# Patient Record
Sex: Female | Born: 1955 | Race: White | Hispanic: No | Smoking: Never smoker
Health system: Southern US, Community
[De-identification: ages and names within clinical notes are randomized; demographics above are authoritative.]

## PROBLEM LIST (undated history)

## (undated) DIAGNOSIS — F329 Major depressive disorder, single episode, unspecified: Secondary | ICD-10-CM

## (undated) DIAGNOSIS — D649 Anemia, unspecified: Secondary | ICD-10-CM

## (undated) DIAGNOSIS — K909 Intestinal malabsorption, unspecified: Secondary | ICD-10-CM

## (undated) DIAGNOSIS — M797 Fibromyalgia: Secondary | ICD-10-CM

## (undated) DIAGNOSIS — N289 Disorder of kidney and ureter, unspecified: Secondary | ICD-10-CM

## (undated) DIAGNOSIS — I219 Acute myocardial infarction, unspecified: Secondary | ICD-10-CM

## (undated) DIAGNOSIS — S92919A Unspecified fracture of unspecified toe(s), initial encounter for closed fracture: Secondary | ICD-10-CM

## (undated) DIAGNOSIS — C229 Malignant neoplasm of liver, not specified as primary or secondary: Secondary | ICD-10-CM

## (undated) DIAGNOSIS — M4800 Spinal stenosis, site unspecified: Secondary | ICD-10-CM

## (undated) DIAGNOSIS — M5430 Sciatica, unspecified side: Secondary | ICD-10-CM

## (undated) DIAGNOSIS — C50911 Malignant neoplasm of unspecified site of right female breast: Secondary | ICD-10-CM

## (undated) DIAGNOSIS — I10 Essential (primary) hypertension: Secondary | ICD-10-CM

## (undated) DIAGNOSIS — M419 Scoliosis, unspecified: Secondary | ICD-10-CM

## (undated) DIAGNOSIS — F32A Depression, unspecified: Secondary | ICD-10-CM

## (undated) DIAGNOSIS — M545 Low back pain, unspecified: Secondary | ICD-10-CM

## (undated) DIAGNOSIS — M159 Polyosteoarthritis, unspecified: Secondary | ICD-10-CM

## (undated) DIAGNOSIS — K56609 Unspecified intestinal obstruction, unspecified as to partial versus complete obstruction: Secondary | ICD-10-CM

## (undated) DIAGNOSIS — C73 Malignant neoplasm of thyroid gland: Secondary | ICD-10-CM

## (undated) DIAGNOSIS — G8929 Other chronic pain: Secondary | ICD-10-CM

## (undated) DIAGNOSIS — E039 Hypothyroidism, unspecified: Secondary | ICD-10-CM

## (undated) DIAGNOSIS — D510 Vitamin B12 deficiency anemia due to intrinsic factor deficiency: Secondary | ICD-10-CM

## (undated) DIAGNOSIS — G4730 Sleep apnea, unspecified: Secondary | ICD-10-CM

## (undated) DIAGNOSIS — I509 Heart failure, unspecified: Secondary | ICD-10-CM

## (undated) DIAGNOSIS — D500 Iron deficiency anemia secondary to blood loss (chronic): Principal | ICD-10-CM

## (undated) DIAGNOSIS — R112 Nausea with vomiting, unspecified: Secondary | ICD-10-CM

## (undated) DIAGNOSIS — M009 Pyogenic arthritis, unspecified: Secondary | ICD-10-CM

## (undated) DIAGNOSIS — K432 Incisional hernia without obstruction or gangrene: Secondary | ICD-10-CM

## (undated) DIAGNOSIS — K219 Gastro-esophageal reflux disease without esophagitis: Secondary | ICD-10-CM

## (undated) HISTORY — DX: Incisional hernia without obstruction or gangrene: K43.2

## (undated) HISTORY — DX: Polyosteoarthritis, unspecified: M15.9

## (undated) HISTORY — DX: Intestinal malabsorption, unspecified: K90.9

## (undated) HISTORY — PX: BREAST RECONSTRUCTION: SHX9

## (undated) HISTORY — DX: Sciatica, unspecified side: M54.30

## (undated) HISTORY — DX: Major depressive disorder, single episode, unspecified: F32.9

## (undated) HISTORY — PX: CARDIAC CATHETERIZATION: SHX172

## (undated) HISTORY — DX: Depression, unspecified: F32.A

## (undated) HISTORY — DX: Pyogenic arthritis, unspecified: M00.9

## (undated) HISTORY — DX: Essential (primary) hypertension: I10

## (undated) HISTORY — DX: Hypothyroidism, unspecified: E03.9

## (undated) HISTORY — PX: RADIOFREQUENCY ABLATION LIVER TUMOR: SHX2293

## (undated) HISTORY — DX: Spinal stenosis, site unspecified: M48.00

## (undated) HISTORY — DX: Iron deficiency anemia secondary to blood loss (chronic): D50.0

## (undated) HISTORY — DX: Disorder of kidney and ureter, unspecified: N28.9

## (undated) HISTORY — DX: Scoliosis, unspecified: M41.9

## (undated) HISTORY — DX: Vitamin B12 deficiency anemia due to intrinsic factor deficiency: D51.0

## (undated) HISTORY — DX: Unspecified intestinal obstruction, unspecified as to partial versus complete obstruction: K56.609

---
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
## 1985-06-29 HISTORY — PX: CHOLECYSTECTOMY OPEN: SUR202

## 2004-05-01 HISTORY — PX: GASTRIC BYPASS: SHX52

## 2006-05-01 HISTORY — PX: BREAST BIOPSY: SHX20

## 2006-05-01 HISTORY — PX: MASTECTOMY: SHX3

## 2007-05-02 LAB — HM MAMMOGRAPHY: HM Mammogram: ABNORMAL

## 2008-07-22 ENCOUNTER — Inpatient Hospital Stay (HOSPITAL_COMMUNITY): Admission: EM | Admit: 2008-07-22 | Discharge: 2008-07-29 | Payer: Self-pay | Admitting: General Surgery

## 2008-07-22 ENCOUNTER — Encounter: Payer: Self-pay | Admitting: Emergency Medicine

## 2008-07-22 ENCOUNTER — Ambulatory Visit: Payer: Self-pay | Admitting: Radiology

## 2009-05-01 DIAGNOSIS — C229 Malignant neoplasm of liver, not specified as primary or secondary: Secondary | ICD-10-CM

## 2009-05-01 HISTORY — PX: COLECTOMY: SHX59

## 2009-05-01 HISTORY — DX: Malignant neoplasm of liver, not specified as primary or secondary: C22.9

## 2009-05-18 ENCOUNTER — Ambulatory Visit: Payer: Self-pay | Admitting: Hematology & Oncology

## 2010-01-28 DIAGNOSIS — M009 Pyogenic arthritis, unspecified: Secondary | ICD-10-CM

## 2010-01-28 HISTORY — DX: Pyogenic arthritis, unspecified: M00.9

## 2010-01-29 ENCOUNTER — Ambulatory Visit: Payer: Self-pay | Admitting: Diagnostic Radiology

## 2010-01-29 ENCOUNTER — Encounter: Payer: Self-pay | Admitting: Emergency Medicine

## 2010-01-30 ENCOUNTER — Ambulatory Visit: Payer: Self-pay | Admitting: Cardiology

## 2010-01-30 ENCOUNTER — Inpatient Hospital Stay (HOSPITAL_COMMUNITY): Admission: EM | Admit: 2010-01-30 | Discharge: 2010-02-10 | Payer: Self-pay | Admitting: Internal Medicine

## 2010-01-31 ENCOUNTER — Ambulatory Visit: Payer: Self-pay | Admitting: Infectious Diseases

## 2010-01-31 DIAGNOSIS — M009 Pyogenic arthritis, unspecified: Secondary | ICD-10-CM | POA: Insufficient documentation

## 2010-02-01 ENCOUNTER — Encounter: Payer: Self-pay | Admitting: Internal Medicine

## 2010-02-02 ENCOUNTER — Ambulatory Visit: Payer: Self-pay | Admitting: Surgery

## 2010-02-07 ENCOUNTER — Encounter (INDEPENDENT_AMBULATORY_CARE_PROVIDER_SITE_OTHER): Payer: Self-pay | Admitting: Family Medicine

## 2010-03-09 ENCOUNTER — Ambulatory Visit: Payer: Self-pay | Admitting: Infectious Diseases

## 2010-03-09 DIAGNOSIS — C50919 Malignant neoplasm of unspecified site of unspecified female breast: Secondary | ICD-10-CM | POA: Insufficient documentation

## 2010-03-14 ENCOUNTER — Ambulatory Visit: Payer: Self-pay | Admitting: Infectious Diseases

## 2010-04-04 ENCOUNTER — Ambulatory Visit (HOSPITAL_COMMUNITY): Admission: RE | Admit: 2010-04-04 | Payer: Self-pay | Admitting: Infectious Diseases

## 2010-04-05 ENCOUNTER — Telehealth: Payer: Self-pay | Admitting: Infectious Diseases

## 2010-04-20 ENCOUNTER — Ambulatory Visit: Payer: Self-pay | Admitting: Infectious Diseases

## 2010-05-10 ENCOUNTER — Ambulatory Visit: Admit: 2010-05-10 | Payer: Self-pay | Admitting: Infectious Diseases

## 2010-05-22 ENCOUNTER — Encounter: Payer: Self-pay | Admitting: Infectious Diseases

## 2010-05-31 NOTE — Assessment & Plan Note (Signed)
Summary: hsfu need chart/bacteremia,septic arthritis   Vital Signs:  Patient profile:   55 year old female Height:      66.5 inches (168.91 cm) Weight:      256.8 pounds (116.73 kg) BMI:     40.98 Temp:     98.8 degrees F (37.11 degrees C) oral Pulse rate:   71 / minute BP sitting:   180 / 97  (left arm)  Vitals Entered By: Wendall Mola CMA [facility]) (March 09, 2010 9:03 AM) CC: new pt. hospital followup blood sepsis Is Patient Diabetic? No Pain Assessment Patient in pain? yes     Location: right leg Intensity: 7 Type: aching Onset of pain  Constant Nutritional Status BMI of > 30 = obese Nutritional Status Detail appetite "normal"  Have you ever been in a relationship where you felt threatened, hurt or afraid?No   Does patient need assistance? Functional Status Self care Ambulation Normal   CC:  new pt. hospital followup blood sepsis.  History of Present Illness: 55 yo F with hx of Breast Cancer, hypothyroidism and adm 01-30-10 to 02-10-10 with group B strep bacteremia and poly arthraligias. She was found to have a ANA+ in the hospital as well. She had swelling of her sternclavicular joint and an aspirate of her sternclavicular joint which was negative. She was treated with Ceftriaxone in hospital and d/c home with PIC (shceduled to [place] 03-10-10). She continues to have pain in her joints. She had a steroid injection into her R knee but had no improvement. She has been started on methotrexate, folic acid.  No problems with PIC. No fevers or chills. Cont to have pain in [place] joint.   Depression History:      The patient denies a depressed mood most of the day and a diminished interest in her usual daily activities.        The patient denies that she feels like life is not worth living, denies that she wishes that she were dead, and denies that she has thought about ending her life.        Preventive Screening-Counseling & Management  Alcohol-Tobacco     Alcohol  drinks/day: 0     Smoking Status: never  Caffeine-Diet-Exercise     Caffeine use/day: coffee 2 per day     Does Patient Exercise: yes     Type of exercise: stretching  Safety-Violence-Falls     Seat Belt Use: yes      Drug Use:  never.    Allergies (verified): No Known Drug Allergies  Past History:  Family History: Last updated: 03/09/2010 Family Hsitory Breast cancer 1st degree relative <50 mother ANA+  Social History: Last updated: 03/09/2010 Never Smoked Alcohol use-no  Past Medical History: Group B streptococcus Bacteremia 01-30-10 with Sternoclavicular joint swelling (aspirate Cx negative)   ANA POSITIVE (ICD-790.99) FAMILY HSITORY BREAST CANCER 1ST DEGREE RELATIVE <50 (ICD-V16.3) ADENOCARCINOMA, RIGHT BREAST, STAGE II (ICD-174.9) HYPOTHYROIDISM (ICD-244.9) SEPTIC ARTHRITIS (ICD-711.00)  Family History: Family Hsitory Breast cancer 1st degree relative <50 mother ANA+  Social History: Never Smoked Alcohol use-no Drug Use:  never  Review of Systems       doesn't take BP meds until later in the AM. headaches. has floaters in her vision.   Physical Exam  General:  well-developed, well-nourished, well-hydrated, and overweight-appearing.   Eyes:  pupils equal, pupils round, and pupils reactive to light.   Mouth:  pharynx pink and moist.  superficial ulcers on tongue Neck:  no masses.  Chest Wall:  mild tenderness over L chest [place] joint, no effusion or erythema.  Lungs:  normal respiratory effort and normal breath sounds.   Heart:  normal rate, regular rhythm, and no murmur.   Abdomen:  soft, non-tender, and normal bowel sounds.   Extremities:  no joint effusion in elbow, wrists, knees. she has mild swelling in L 3rd DIP. she has bruising over her L knee and wounds from her previous joint aspiration.  LUE PIC- non-tender, clean, no d/c.    Impression & Recommendations:  Problem # 1:  SEPTIC ARTHRITIS (ICD-711.00)  she appears to be doing well. Would  like to confirm this though. Wll pull her PIC and d/c her ceftriaxone as planned on 03-10-10. Will repeat her MRI of her [place] joint. will recheck her BCx after she has been off meds for 1 week. return to clinic 02-18-10. Her updated medication list for this problem includes:    Tylenol Arthritis Pain 650 Mg Cr-tabs (Acetaminophen) ..... One every four hrs as needed    Ceftriaxone Sodium 2 Gm Solr (Ceftriaxone sodium) .Marland Kitchen... 2 grams iv every 24 hrs for 31 days    Vicodin Hp 10-660 Mg Tabs (Hydrocodone-acetaminophen) .Marland Kitchen... Take 1 tablet by mouth three times a day  Orders: New Patient Level IV (62130) MRI with & without Contrast (MRI w&w/o Contrast)Future Orders: T-Culture, Blood Routine ([id number]) ... 03/14/2010 T-Culture, Blood Routine ([id number]) ... 03/14/2010  Problem # 2:  ANA POSITIVE (ICD-790.99)  will increase her vicodin until she has f/u with Dr Dareen Piano.   Orders: New Patient Level IV (01027)  Medications Added to Medication List This Visit: 1)  Tylenol Arthritis Pain 650 Mg Cr-tabs (Acetaminophen) .... One every four hrs as needed 2)  Ceftriaxone Sodium 2 Gm Solr (Ceftriaxone sodium) .... 2 grams iv every 24 hrs for 31 days 3)  Diclofenac Sodium 75 Mg Tbec (Diclofenac sodium) .... One every 8 hrs. as needed 4)  Dulcolax Stool Softener 100 Mg Caps (Docusate sodium) .... Twice per day 5)  Protonix 40 Mg Pack (Pantoprazole sodium) .... One tablet one time a day 6)  Prednisone 20 Mg Tabs (Prednisone) .... Take 1 tablet by mouth once a day 7)  Coreg 12.5 Mg Tabs (Carvedilol) .... Take 1 tablet by mouth two times a day 8)  Femara 2.5 Mg Tabs (Letrozole) .... Take 1 tablet by mouth once a day 9)  Levothyroxine Sodium 200 Mcg Tabs (Levothyroxine sodium) .... Take 1 tablet by mouth once a day 10)  Prozac 20 Mg Caps (Fluoxetine hcl) .... Take 1 tablet by mouth once a day 11)  Wellbutrin Xl 150 Mg Xr24h-tab (Bupropion hcl) .... Take 1 tablet by mouth once a day 12)  Methotrexate 2.5  Mg Tabs (Methotrexate sodium) .... 6 tabs qweek 13)  Mynatal Caps (Prenatal multivit-min-fe-fa) .... Take 1 tablet by mouth once a day 14)  Folic Acid 1 Mg Tabs (Folic acid) .... Take 1 tablet by mouth once a day 15)  Vicodin Hp 10-660 Mg Tabs (Hydrocodone-acetaminophen) .... Take 1 tablet by mouth three times a day Prescriptions: VICODIN HP 10-660 MG TABS (HYDROCODONE-ACETAMINOPHEN) Take 1 tablet by mouth three times a day  #30 x 0   Entered and Authorized by:   Johny Sax MD   Signed by:   Johny Sax MD on 03/09/2010   Method used:   Print then Give to Patient   RxID:   [id number]    Orders Added: 1)  New Patient Level IV [63875] 2)  MRI with &  without Contrast [MRI w&w/o Contrast] 3)  T-Culture, Blood Routine [[id number]] 4)  T-Culture, Blood Routine [[id number]]

## 2010-05-31 NOTE — Miscellaneous (Signed)
Summary: HIPAA Restrictions  HIPAA Restrictions   Imported By: Florinda Marker 03/14/2010 15:58:27  _____________________________________________________________________  External Attachment:    Type:   Image     Comment:   External Document

## 2010-06-02 NOTE — Progress Notes (Signed)
Summary: pt. noshowed MRI  Phone Note Other Incoming   Caller: Steward Drone from [facility] Summary of Call: Steward Drone from [facility] called to report pt. noshowed MRI today.  Called and left pt. message with sheduling phone number [phone] to reschedule MRI Initial call taken by: Wendall Mola CMA [facility]),  April 05, 2010 11:24 AM

## 2010-07-07 ENCOUNTER — Encounter: Payer: Self-pay | Admitting: Licensed Clinical Social Worker

## 2010-07-14 LAB — POCT CARDIAC MARKERS: Troponin i, poc: <0.05 ng/mL (ref 0.00–0.09)

## 2010-07-14 LAB — PROTEIN ELECTROPHORESIS, SERUM
Albumin ELP: 39.4 % — ABNORMAL LOW (ref 55.8–66.1)
M-Spike, %: NOT DETECTED g/dL
Total Protein ELP: 4.7 g/dL — ABNORMAL LOW (ref 6.0–8.3)

## 2010-07-14 LAB — CBC
HCT: 23.9 % — ABNORMAL LOW (ref 36.0–46.0)
HCT: 24.5 % — ABNORMAL LOW (ref 36.0–46.0)
HCT: 25.3 % — ABNORMAL LOW (ref 36.0–46.0)
HCT: 26.1 % — ABNORMAL LOW (ref 36.0–46.0)
HCT: 28.5 % — ABNORMAL LOW (ref 36.0–46.0)
HCT: 29.8 % — ABNORMAL LOW (ref 36.0–46.0)
Hemoglobin: 7.7 g/dL — ABNORMAL LOW (ref 12.0–15.0)
Hemoglobin: 7.8 g/dL — ABNORMAL LOW (ref 12.0–15.0)
Hemoglobin: 8.5 g/dL — ABNORMAL LOW (ref 12.0–15.0)
Hemoglobin: 8.5 g/dL — ABNORMAL LOW (ref 12.0–15.0)
MCH: 30 pg (ref 26.0–34.0)
MCH: 30.4 pg (ref 26.0–34.0)
MCH: 31 pg (ref 26.0–34.0)
MCH: 31.2 pg (ref 26.0–34.0)
MCH: 31.7 pg (ref 26.0–34.0)
MCH: 31.8 pg (ref 26.0–34.0)
MCHC: 32.2 g/dL (ref 30.0–36.0)
MCHC: 32.5 g/dL (ref 30.0–36.0)
MCHC: 32.8 g/dL (ref 30.0–36.0)
MCHC: 33.6 g/dL (ref 30.0–36.0)
MCV: 91.6 fL (ref 78.0–100.0)
MCV: 92.8 fL (ref 78.0–100.0)
MCV: 94.1 fL (ref 78.0–100.0)
MCV: 94.5 fL (ref 78.0–100.0)
MCV: 94.8 fL (ref 78.0–100.0)
Platelets: 198 10*3/uL (ref 150–400)
Platelets: 213 10*3/uL (ref 150–400)
Platelets: 384 10*3/uL (ref 150–400)
Platelets: 416 10*3/uL — ABNORMAL HIGH (ref 150–400)
Platelets: 481 10*3/uL — ABNORMAL HIGH (ref 150–400)
RBC: 2.5 MIL/uL — ABNORMAL LOW (ref 3.87–5.11)
RBC: 2.67 MIL/uL — ABNORMAL LOW (ref 3.87–5.11)
RBC: 3.07 MIL/uL — ABNORMAL LOW (ref 3.87–5.11)
RBC: 3.11 MIL/uL — ABNORMAL LOW (ref 3.87–5.11)
RBC: 3.68 MIL/uL — ABNORMAL LOW (ref 3.87–5.11)
RDW: 13.2 % (ref 11.5–15.5)
RDW: 13.3 % (ref 11.5–15.5)
RDW: 13.3 % (ref 11.5–15.5)
RDW: 13.3 % (ref 11.5–15.5)
RDW: 13.5 % (ref 11.5–15.5)
RDW: 13.8 % (ref 11.5–15.5)
RDW: 13.9 % (ref 11.5–15.5)
RDW: 14 % (ref 11.5–15.5)
WBC: 11.2 10*3/uL — ABNORMAL HIGH (ref 4.0–10.5)
WBC: 18.7 10*3/uL — ABNORMAL HIGH (ref 4.0–10.5)
WBC: 19 10*3/uL — ABNORMAL HIGH (ref 4.0–10.5)
WBC: 8.2 10*3/uL (ref 4.0–10.5)
WBC: 8.5 10*3/uL (ref 4.0–10.5)
WBC: 9.7 10*3/uL (ref 4.0–10.5)

## 2010-07-14 LAB — BASIC METABOLIC PANEL
BUN: 26 mg/dL — ABNORMAL HIGH (ref 6–23)
BUN: 29 mg/dL — ABNORMAL HIGH (ref 6–23)
BUN: 52 mg/dL — ABNORMAL HIGH (ref 6–23)
CO2: 23 mEq/L (ref 19–32)
Calcium: 9.1 mg/dL (ref 8.4–10.5)
Chloride: 109 mEq/L (ref 96–112)
Chloride: 111 mEq/L (ref 96–112)
Chloride: 112 mEq/L (ref 96–112)
Creatinine, Ser: 1.05 mg/dL (ref 0.4–1.2)
Creatinine, Ser: 1.4 mg/dL — ABNORMAL HIGH (ref 0.4–1.2)
GFR calc Af Amer: 47 mL/min — ABNORMAL LOW (ref >60)
GFR calc Af Amer: >60 mL/min (ref >60)
GFR calc Af Amer: >60 mL/min (ref >60)
GFR calc non Af Amer: 54 mL/min — ABNORMAL LOW (ref >60)
GFR calc non Af Amer: 57 mL/min — ABNORMAL LOW (ref >60)
GFR calc non Af Amer: >60 mL/min (ref >60)
Glucose, Bld: 83 mg/dL (ref 70–99)
Potassium: 3.7 mEq/L (ref 3.5–5.1)
Potassium: 3.8 mEq/L (ref 3.5–5.1)
Potassium: 3.9 mEq/L (ref 3.5–5.1)
Sodium: 137 mEq/L (ref 135–145)
Sodium: 140 mEq/L (ref 135–145)

## 2010-07-14 LAB — COMPREHENSIVE METABOLIC PANEL
ALT: 37 U/L — ABNORMAL HIGH (ref 0–35)
ALT: 42 U/L — ABNORMAL HIGH (ref 0–35)
ALT: 45 U/L — ABNORMAL HIGH (ref 0–35)
ALT: 47 U/L — ABNORMAL HIGH (ref 0–35)
ALT: 50 U/L — ABNORMAL HIGH (ref 0–35)
AST: 14 U/L (ref 0–37)
AST: 32 U/L (ref 0–37)
AST: 41 U/L — ABNORMAL HIGH (ref 0–37)
AST: 44 U/L — ABNORMAL HIGH (ref 0–37)
AST: 46 U/L — ABNORMAL HIGH (ref 0–37)
AST: 60 U/L — ABNORMAL HIGH (ref 0–37)
Albumin: 1.9 g/dL — ABNORMAL LOW (ref 3.5–5.2)
Albumin: 2.1 g/dL — ABNORMAL LOW (ref 3.5–5.2)
Albumin: 2.2 g/dL — ABNORMAL LOW (ref 3.5–5.2)
Albumin: 2.2 g/dL — ABNORMAL LOW (ref 3.5–5.2)
Alkaline Phosphatase: 72 U/L (ref 39–117)
Alkaline Phosphatase: 76 U/L (ref 39–117)
Alkaline Phosphatase: 88 U/L (ref 39–117)
BUN: 13 mg/dL (ref 6–23)
BUN: 13 mg/dL (ref 6–23)
BUN: 34 mg/dL — ABNORMAL HIGH (ref 6–23)
CO2: 19 mEq/L (ref 19–32)
CO2: 29 mEq/L (ref 19–32)
CO2: 29 mEq/L (ref 19–32)
CO2: 30 mEq/L (ref 19–32)
CO2: 30 mEq/L (ref 19–32)
Calcium: 8.1 mg/dL — ABNORMAL LOW (ref 8.4–10.5)
Calcium: 8.2 mg/dL — ABNORMAL LOW (ref 8.4–10.5)
Calcium: 8.4 mg/dL (ref 8.4–10.5)
Calcium: 8.4 mg/dL (ref 8.4–10.5)
Calcium: 8.5 mg/dL (ref 8.4–10.5)
Calcium: 8.7 mg/dL (ref 8.4–10.5)
Chloride: 105 mEq/L (ref 96–112)
Creatinine, Ser: 0.87 mg/dL (ref 0.4–1.2)
Creatinine, Ser: 0.95 mg/dL (ref 0.4–1.2)
Creatinine, Ser: 0.95 mg/dL (ref 0.4–1.2)
Creatinine, Ser: 1.18 mg/dL (ref 0.4–1.2)
GFR calc Af Amer: 58 mL/min — ABNORMAL LOW (ref >60)
GFR calc Af Amer: >60 mL/min (ref >60)
GFR calc Af Amer: >60 mL/min (ref >60)
GFR calc Af Amer: >60 mL/min (ref >60)
GFR calc Af Amer: >60 mL/min (ref >60)
GFR calc non Af Amer: 48 mL/min — ABNORMAL LOW (ref >60)
GFR calc non Af Amer: 59 mL/min — ABNORMAL LOW (ref >60)
GFR calc non Af Amer: >60 mL/min (ref >60)
GFR calc non Af Amer: >60 mL/min (ref >60)
Glucose, Bld: 100 mg/dL — ABNORMAL HIGH (ref 70–99)
Glucose, Bld: 107 mg/dL — ABNORMAL HIGH (ref 70–99)
Glucose, Bld: 111 mg/dL — ABNORMAL HIGH (ref 70–99)
Glucose, Bld: 86 mg/dL (ref 70–99)
Potassium: 3.4 mEq/L — ABNORMAL LOW (ref 3.5–5.1)
Potassium: 3.6 mEq/L (ref 3.5–5.1)
Potassium: 3.9 mEq/L (ref 3.5–5.1)
Sodium: 137 mEq/L (ref 135–145)
Sodium: 137 mEq/L (ref 135–145)
Sodium: 138 mEq/L (ref 135–145)
Sodium: 139 mEq/L (ref 135–145)
Total Bilirubin: 0.6 mg/dL (ref 0.3–1.2)
Total Protein: 6.1 g/dL (ref 6.0–8.3)
Total Protein: 6.3 g/dL (ref 6.0–8.3)
Total Protein: 6.7 g/dL (ref 6.0–8.3)
Total Protein: 6.7 g/dL (ref 6.0–8.3)
Total Protein: 6.8 g/dL (ref 6.0–8.3)

## 2010-07-14 LAB — SYNOVIAL CELL COUNT + DIFF, W/ CRYSTALS
Lymphocytes-Synovial Fld: 0 % (ref 0–20)
Monocyte-Macrophage-Synovial Fluid: 5 % — ABNORMAL LOW (ref 50–90)
Other Cells-SYN: 0

## 2010-07-14 LAB — DIFFERENTIAL
Basophils Absolute: 0 10*3/uL (ref 0.0–0.1)
Basophils Absolute: 0.2 10*3/uL — ABNORMAL HIGH (ref 0.0–0.1)
Basophils Relative: 1 % (ref 0–1)
Eosinophils Absolute: 0 10*3/uL (ref 0.0–0.7)
Lymphocytes Relative: 6 % — ABNORMAL LOW (ref 12–46)
Lymphs Abs: 0.7 10*3/uL (ref 0.7–4.0)
Lymphs Abs: 1 10*3/uL (ref 0.7–4.0)
Neutro Abs: 15.3 10*3/uL — ABNORMAL HIGH (ref 1.7–7.7)
Neutrophils Relative %: 86 % — ABNORMAL HIGH (ref 43–77)
Neutrophils Relative %: 91 % — ABNORMAL HIGH (ref 43–77)

## 2010-07-14 LAB — IRON AND TIBC
Iron: 31 ug/dL — ABNORMAL LOW (ref 42–135)
Saturation Ratios: 16 % — ABNORMAL LOW (ref 20–55)
TIBC: 189 ug/dL — ABNORMAL LOW (ref 250–470)
UIBC: 158 ug/dL

## 2010-07-14 LAB — CULTURE, BLOOD (ROUTINE X 2)
Culture  Setup Time: 201110021517
Culture  Setup Time: 201110040018
Culture: NO GROWTH

## 2010-07-14 LAB — EXTRACTABLE NUCLEAR ANTIGEN ANTIBODY
ENA SM Ab Ser-aCnc: <1 AU/mL (ref <30)
SSA (Ro) (ENA) Antibody, IgG: 3 AU/mL (ref <30)
SSB (La) (ENA) Antibody, IgG: <1 AU/mL (ref <30)
Scleroderma (Scl-70) (ENA) Antibody, IgG: 2 AU/mL (ref <30)
Scleroderma (Scl-70) (ENA) Antibody, IgG: 3 AU/mL (ref <30)
Sm/rnp: <1 AU/mL (ref <30)
ds DNA Ab: 1 IU/mL (ref <30)

## 2010-07-14 LAB — SEDIMENTATION RATE
Sed Rate: 128 mm/hr — ABNORMAL HIGH (ref 0–22)
Sed Rate: 128 mm/hr — ABNORMAL HIGH (ref 0–22)

## 2010-07-14 LAB — PROTIME-INR
INR: 1.23 (ref 0.00–1.49)
Prothrombin Time: 15.7 seconds — ABNORMAL HIGH (ref 11.6–15.2)

## 2010-07-14 LAB — MAGNESIUM: Magnesium: 1.8 mg/dL (ref 1.5–2.5)

## 2010-07-14 LAB — URINALYSIS, ROUTINE W REFLEX MICROSCOPIC
Bilirubin Urine: NEGATIVE
Glucose, UA: NEGATIVE mg/dL
Ketones, ur: 15 mg/dL — AB
Specific Gravity, Urine: 1.028 (ref 1.005–1.030)
pH: 6 (ref 5.0–8.0)

## 2010-07-14 LAB — BODY FLUID CULTURE: Culture: NO GROWTH

## 2010-07-14 LAB — BODY FLUID CRYSTAL: Crystals, Fluid: NONE SEEN

## 2010-07-14 LAB — URINE MICROSCOPIC-ADD ON

## 2010-07-14 LAB — C-REACTIVE PROTEIN
CRP: 12.7 mg/dL — ABNORMAL HIGH (ref <0.6)
CRP: 18 mg/dL — ABNORMAL HIGH (ref <0.6)
CRP: 21.3 mg/dL — ABNORMAL HIGH (ref <0.6)
CRP: 8.6 mg/dL — ABNORMAL HIGH (ref <0.6)

## 2010-07-14 LAB — CLOSTRIDIUM DIFFICILE BY PCR: Toxigenic C. Difficile by PCR: NOT DETECTED

## 2010-07-14 LAB — URINE CULTURE

## 2010-07-14 LAB — FOLATE: Folate: 13.6 ng/mL

## 2010-07-14 LAB — GC/CHLAMYDIA PROBE AMP, URINE
Chlamydia, Swab/Urine, PCR: NEGATIVE
GC Probe Amp, Urine: NEGATIVE

## 2010-07-14 LAB — ANA: Anti Nuclear Antibody(ANA): NEGATIVE

## 2010-07-14 LAB — ANTI-NEUTROPHIL ANTIBODY

## 2010-07-14 LAB — C4 COMPLEMENT: Complement C4, Body Fluid: 30 mg/dL (ref 16–47)

## 2010-07-14 LAB — ANTI-DNA ANTIBODY, DOUBLE-STRANDED: ds DNA Ab: 1 IU/mL (ref <30)

## 2010-07-14 LAB — HIV ANTIBODY (ROUTINE TESTING W REFLEX): HIV: NONREACTIVE

## 2010-07-14 LAB — APTT: aPTT: 38 seconds — ABNORMAL HIGH (ref 24–37)

## 2010-07-14 LAB — URIC ACID: Uric Acid, Serum: 6.8 mg/dL (ref 2.4–7.0)

## 2010-07-14 LAB — TSH: TSH: 0.445 u[IU]/mL (ref 0.350–4.500)

## 2010-07-14 LAB — CYCLIC CITRUL PEPTIDE ANTIBODY, IGG: Cyclic Citrullin Peptide Ab: <2 U/mL (ref 0.0–5.0)

## 2010-07-14 LAB — CK: Total CK: 20 U/L (ref 7–177)

## 2010-07-14 LAB — FERRITIN: Ferritin: 662 ng/mL — ABNORMAL HIGH (ref 10–291)

## 2010-08-11 LAB — GLUCOSE, CAPILLARY
Glucose-Capillary: 100 mg/dL — ABNORMAL HIGH (ref 70–99)
Glucose-Capillary: 100 mg/dL — ABNORMAL HIGH (ref 70–99)
Glucose-Capillary: 101 mg/dL — ABNORMAL HIGH (ref 70–99)
Glucose-Capillary: 112 mg/dL — ABNORMAL HIGH (ref 70–99)
Glucose-Capillary: 69 mg/dL — ABNORMAL LOW (ref 70–99)
Glucose-Capillary: 81 mg/dL (ref 70–99)
Glucose-Capillary: 87 mg/dL (ref 70–99)
Glucose-Capillary: 87 mg/dL (ref 70–99)
Glucose-Capillary: 87 mg/dL (ref 70–99)
Glucose-Capillary: 88 mg/dL (ref 70–99)
Glucose-Capillary: 89 mg/dL (ref 70–99)
Glucose-Capillary: 89 mg/dL (ref 70–99)
Glucose-Capillary: 92 mg/dL (ref 70–99)
Glucose-Capillary: 95 mg/dL (ref 70–99)
Glucose-Capillary: 97 mg/dL (ref 70–99)
Glucose-Capillary: 97 mg/dL (ref 70–99)
Glucose-Capillary: 97 mg/dL (ref 70–99)
Glucose-Capillary: 99 mg/dL (ref 70–99)

## 2010-08-11 LAB — CBC
HCT: 28.1 % — ABNORMAL LOW (ref 36.0–46.0)
HCT: 31.6 % — ABNORMAL LOW (ref 36.0–46.0)
HCT: 39.9 % (ref 36.0–46.0)
Hemoglobin: 10.9 g/dL — ABNORMAL LOW (ref 12.0–15.0)
Hemoglobin: 13.1 g/dL (ref 12.0–15.0)
MCHC: 34.4 g/dL (ref 30.0–36.0)
MCHC: 34.6 g/dL (ref 30.0–36.0)
MCHC: 34.7 g/dL (ref 30.0–36.0)
MCV: 96.6 fL (ref 78.0–100.0)
MCV: 97.4 fL (ref 78.0–100.0)
MCV: 97.7 fL (ref 78.0–100.0)
MCV: 98.4 fL (ref 78.0–100.0)
RBC: 2.89 MIL/uL — ABNORMAL LOW (ref 3.87–5.11)
RBC: 2.98 MIL/uL — ABNORMAL LOW (ref 3.87–5.11)
RBC: 3.21 MIL/uL — ABNORMAL LOW (ref 3.87–5.11)
RBC: 4.08 MIL/uL (ref 3.87–5.11)
RDW: 12.8 % (ref 11.5–15.5)
WBC: 4.1 10*3/uL (ref 4.0–10.5)
WBC: 9 10*3/uL (ref 4.0–10.5)

## 2010-08-11 LAB — BASIC METABOLIC PANEL
BUN: 6 mg/dL (ref 6–23)
BUN: 6 mg/dL (ref 6–23)
CO2: 25 mEq/L (ref 19–32)
CO2: 26 mEq/L (ref 19–32)
CO2: 28 mEq/L (ref 19–32)
Calcium: 8 mg/dL — ABNORMAL LOW (ref 8.4–10.5)
Chloride: 109 mEq/L (ref 96–112)
Chloride: 109 mEq/L (ref 96–112)
Chloride: 112 mEq/L (ref 96–112)
Creatinine, Ser: 1.03 mg/dL (ref 0.4–1.2)
GFR calc Af Amer: >60 mL/min (ref >60)
GFR calc Af Amer: >60 mL/min (ref >60)
Glucose, Bld: 85 mg/dL (ref 70–99)
Potassium: 4 mEq/L (ref 3.5–5.1)
Sodium: 141 mEq/L (ref 135–145)

## 2010-08-11 LAB — COMPREHENSIVE METABOLIC PANEL
Albumin: 4.6 g/dL (ref 3.5–5.2)
Alkaline Phosphatase: 82 U/L (ref 39–117)
BUN: 23 mg/dL (ref 6–23)
CO2: 30 mEq/L (ref 19–32)
Chloride: 104 mEq/L (ref 96–112)
Creatinine, Ser: 1 mg/dL (ref 0.4–1.2)
GFR calc non Af Amer: 58 mL/min — ABNORMAL LOW (ref >60)
Glucose, Bld: 156 mg/dL — ABNORMAL HIGH (ref 70–99)
Potassium: 4.6 mEq/L (ref 3.5–5.1)
Total Bilirubin: 0.9 mg/dL (ref 0.3–1.2)

## 2010-08-11 LAB — DIFFERENTIAL
Basophils Absolute: 0.1 10*3/uL (ref 0.0–0.1)
Basophils Relative: 1 % (ref 0–1)
Monocytes Absolute: 0.2 10*3/uL (ref 0.1–1.0)
Neutro Abs: 8.1 10*3/uL — ABNORMAL HIGH (ref 1.7–7.7)
Neutrophils Relative %: 90 % — ABNORMAL HIGH (ref 43–77)

## 2010-08-11 LAB — URINALYSIS, ROUTINE W REFLEX MICROSCOPIC
Hgb urine dipstick: NEGATIVE
Nitrite: NEGATIVE
Protein, ur: 30 mg/dL — AB
Urobilinogen, UA: 0.2 mg/dL (ref 0.0–1.0)

## 2010-08-11 LAB — URINE MICROSCOPIC-ADD ON

## 2010-08-11 LAB — LIPASE, BLOOD: Lipase: 56 U/L (ref 23–300)

## 2010-09-13 NOTE — H&P (Signed)
NAMEKEM, PARCHER            ACCOUNT NO.:  [id number]   MEDICAL RECORD NO.:  [id number]          PATIENT TYPE:  INP   LOCATION:  5151                         FACILITY:  [facility]   PHYSICIAN:  Cherylynn Ridges, M.D.    DATE OF BIRTH:  [birth year]   DATE OF ADMISSION:  07/22/2008  DATE OF DISCHARGE:                              HISTORY & PHYSICAL   CHIEF COMPLAINT:  Abdominal pain, nausea, and vomiting.   HISTORY OF PRESENT ILLNESS:  Kerri Vasquez is a 55 year old white female  with a history of morbid obesity who has status post gastric bypass  surgery in 2007, which was done at [place].  She also has a history of  hypertension, diabetes mellitus which is controlled by her diet,  hypothyroidism, and breast cancer for which she has had bilateral  mastectomy with breast implants who presented to [facility] Emergency  Department today with complaint of nausea and vomiting for the past 3  days.  She has had some associated abdominal pain; however, last night  she had an acutely worsened episode of abdominal pain.  She states this  was mostly in her epigastric region and upper quadrants of her abdomen.  She did have a bowel movement this morning, but since has not passed any  flatus.  At that time, she presented to the [facility] Emergency  Department where a CT scan of the abdomen and pelvis was done, which  showed a partial small bowel obstruction and at that time was  transferred here.   REVIEW OF SYSTEMS:  Please see HPI, otherwise currently all other  systems are negative.   PAST MEDICAL HISTORY:  Significant for,  1. Hypertension, well controlled.  2. Diabetes mellitus, which is diet controlled.  3. Hypothyroidism.  4. Spinal stenosis.   PAST SURGICAL HISTORY:  1. Open cholecystectomy many years ago.  2. Gastric bypass in 2007.   SOCIAL HISTORY:  The patient is divorced.  She does have at least 1  daughter who is present with her in the room right now.   ALLERGIES:  ADHESIVE  TAPE.   MEDICATIONS:  1. Coreg 12.5 mg daily.  2. Femara, dose unknown.  3. Levothyroxine 200 mcg daily.  4. Wellbutrin 150 mg daily.  5. Prozac 10 mg daily.  6. Vicodin 7.5/750 mg as needed.  7. Tizanidine 4 mg as needed for back spasm.   PHYSICAL EXAMINATION:  GENERAL:  Kerri Vasquez is a 55 year old white  female who is obese and currently lying in bed in mild distress.  VITAL SIGNS:  Temperature 99.4, pulse 84, respirations 20, blood  pressure 100/56.  HEENT:  Head is normocephalic, atraumatic.  Sclerae noninjected.  Pupils  are equal, round, and reactive to light.  Ears and nose without any  obvious masses or lesions.  No rhinorrhea.  Mouth is pink and moist.  Throat shows no exudate.  NECK:  Supple.  Trachea is midline.  No thyromegaly.  HEART:  Regular rate and rhythm.  Normal S1 and S2.  No murmurs,  gallops, or rubs are noted.  +2 carotid, radial and pedal pulses  bilaterally.  LUNGS:  Clear to auscultation bilaterally with no wheezes, rhonchi, or  rales noted.  Respiratory effort is nonlabored.  ABDOMEN:  Soft, nondistended, but she does have exquisite tenderness in  the entire abdomen; however, it is much greater in the upper quadrant  and even greatest in the epigastric region.  She has decreased bowel  sounds.  She has multiple scars noted on her abdomen from her open  cholecystectomy as well as from her gastric bypass surgery.  MUSCULOSKELETAL:  All 4 extremities are symmetrical except for the  patient does have noted lymphedema in her right upper extremity  secondary to her axillary dissection from breast cancer.  Otherwise, all  other extremities show no cyanosis, clubbing, or edema.  SKIN:  Warm and dry.  NEUROLOGIC:  Cranial nerves II through XII appear to be grossly intact.  PSYCHIATRIC:  The patient is alert and oriented x3 with an appropriate  affect.   LABORATORY DATA AND DIAGNOSTICS:  White blood cell count 9,000,  hemoglobin 13.1, hematocrit 39.9,  platelet count 252,000.  Sodium 143,  potassium 4.6, glucose 156, BUN 23, creatinine 1.0.  CT of the abdomen  and pelvis shows a partial small bowel obstruction with a smooth  transition zone and contrast passed the transition point.   IMPRESSION:  1. Partial small bowel obstruction.  2. History of gastric bypass.  3. Hypertension, well controlled.  4. Diabetes mellitus, diet controlled.  5. Hypothyroidism.  6. Spinal stenosis.   PLAN:  At this time, due to the patient's history of gastric bypass  surgery, Dr. Lindie Spruce has discussed this case with Dr. Johna Sheriff who states  the typical care of a patient with gastric bypass and these type  symptoms is to explore the abdomen due to concerns for possible ischemic  situation due to the patient's anatomy after gastric bypass. Therefore,  the patient is made n.p.o. and we will go to the OR tonight.  In the  meantime, she will receive p.r.n. pain medications as well as cefoxitin.      Letha Cape, PA      Cherylynn Ridges, M.D.  Electronically Signed    KEO/MEDQ  D:  07/22/2008  T:  07/23/2008  Job:  [id number]

## 2010-09-13 NOTE — Op Note (Signed)
NAMESIMONE, RODENBECK            ACCOUNT NO.:  [id number]   MEDICAL RECORD NO.:  [id number]          PATIENT TYPE:  INP   LOCATION:  5151                         FACILITY:  [facility]   PHYSICIAN:  Wilmon Arms. Corliss Skains, M.D. DATE OF BIRTH:  January 24, 1956   DATE OF PROCEDURE:  07/22/2008  DATE OF DISCHARGE:                               OPERATIVE REPORT   PREOPERATIVE DIAGNOSIS:  Small bowel obstruction.   POSTOPERATIVE DIAGNOSIS:  Small bowel obstruction secondary to internal  hernia.   PROCEDURE PERFORMED:  Exploratory laparotomy.   SURGEON:  Wilmon Arms. Corliss Skains, MD   ASSISTANT:  Anselm Pancoast. Zachery Dakins, MD   ANESTHESIA:  General endotracheal.   INDICATIONS:  The patient is a 55 year old female with a history of  morbid obesity status post laparoscopic Roux-en-Y gastric bypass in  2007.  She also has a recent history of breast cancer status post  chemotherapy x1 year.  She presents with nausea, vomiting, abdominal  pain over the last several days.  A CT scan showed what appears to be a  small bowel obstruction secondary to an internal hernia.  She presents  now for exploratory laparotomy.   DESCRIPTION OF PROCEDURE:  The patient was brought to the operating room  and placed in supine position on the operating room table.  After an  adequate level of general anesthesia was obtained, a Foley catheter was  placed under sterile technique.  The patient's abdomen was prepped with  Betadine and draped in sterile fashion.  A time-out was taken to ensure  the proper patient and proper procedure.  A vertical midline incision  was made above the umbilicus.  Dissection was carried down to the  subcutaneous tissues to the fascia.  The fascia was opened vertically.  We bluntly entered the peritoneal cavity.  No gross purulence was noted.  The small bowel was only mildly dilated.  We placed a Balfour retractor.  We identified the cecum and traced the terminal ileum in retrograde  fashion.  The ileum  seemed to head superiorly and then to the left.  We  followed the small bowel proximally and we were able to identify the  jejunal anastomosis.  There seemed to be a hernia defect just behind the  anastomosis.  We reduced all the small bowel back to the right side.  There was no sign of any ischemia or other damage to the small bowel.  We identified the limb of small bowel heading to the gastric pouch.  The  anastomosis seemed to be intact and there was no sign of leak or  infection.  We traced the biliopancreatic limb of the ligament of  Treitz.  Once we were satisfied with our anatomy and that the  obstruction had been relieved, we identified the internal hernia defect.  This was closed down with several interrupted figure-of-eight 3-0 silk  sutures.  Once we were satisfied with the closure of the  hernia defect, we irrigated the abdomen.  The fascia was reapproximated  with double-stranded #1 PDS.  The subcutaneous tissues were irrigated.  The staples were used to close the skin.  The patient was extubated  and  brought to recovery room in stable condition.  All sponge, instrument,  and needle counts were correct.      Wilmon Arms. Tsuei, M.D.  Electronically Signed     MKT/MEDQ  D:  07/22/2008  T:  07/23/2008  Job:  [id number]

## 2010-09-13 NOTE — Discharge Summary (Signed)
NAMEBRAYLEY, [name]            ACCOUNT NO.:  [id number]   MEDICAL RECORD NO.:  [id number]          PATIENT TYPE:  INP   LOCATION:  5151                         FACILITY:  [facility]   PHYSICIAN:  Lennie Muckle, MD      DATE OF BIRTH:  Jun 21, 1955   DATE OF ADMISSION:  07/22/2008  DATE OF DISCHARGE:  07/29/2008                               DISCHARGE SUMMARY   ADMITTING PHYSICIAN:  Cherylynn Ridges, M.D.   DISCHARGING PHYSICIAN:  Lennie Muckle, MD   CONSULTANTS:  There were none.   PROCEDURES:  Exploratory laparotomy by Dr. Manus Rudd on July 22, 2008.   REASON FOR ADMISSION:  [name] is a 55 year old white female with a  history of obesity who is status post gastric bypass surgery in 2007 who  presented to the [facility] Emergency Department with a complaint of  nausea and vomiting for the past 3 days.  She had some associated  abdominal pain; however, the night prior to admission she had an acutely  worsened episode of abdominal pain.  At this time, a CT scan of her  abdomen and pelvis was done which showed a partial small bowel  obstruction and at that time, she was transferred here for admission.  Please see admitting history and physical for further details.   ADMITTING DIAGNOSES:  1. Partial small bowel obstruction.  2. History of gastric bypass surgery.  3. Hypertension, well controlled.  4. Diabetes mellitus, diet controlled.  5. Hypothyroidism.  6. Spinal stenosis.   HOSPITAL COURSE:  At this time, the patient was admitted and given a  dose of cefoxitin prior to surgical intervention.  At this time, Dr.  Lindie Spruce discussed this case with Dr. Johna Sheriff secondary to the patient's  history of gastric bypass surgery.  Because of the increased risk of  internal hernia for other complications with gastric bypass the patient  was recommended that due to the patient's CT scan as well as clinical  evaluation that she undergo surgical intervention.  Therefore that  night,  the patient was taken to the operating room where the patient was  found to have a small bowel obstruction secondary to an internal hernia.  The patient tolerated this procedure well.  The first several postop  days, the patient developed postoperative ileus.  At this time, the  patient did not have an NG tube in secondary to her gastric bypass and a  very small stomach.  By postoperative day 3, the patient was beginning  to pass flatus and had active bowel sounds and therefore, she was  advanced to the postgastrectomy clear liquid diet.  Following day, her  diet was advanced; however, on postoperative day 5, the patient  developed some heartburn after eating and began to have a decreased  amount of flatus and therefore she was backed off to n.p.o. and x-ray  was obtained which showed a partial ileus versus partial small bowel  obstruction.  However by postoperative day 6, the patient was once again  passing flatus and had 2 small bowel movements and therefore, she was  started back on clears  and her diet was advanced as tolerated.  By  postoperative day 7, she was tolerating a regular postgastrectomy diet,  continuing to pass flatus and had bowel movements.  At this time, her  staples were ready for removal prior to discharge home and once this was  completed, the patient was felt stable for discharge.   DISCHARGE DIAGNOSES:  1. Small bowel obstruction secondary to internal hernia.  2. Status post exploratory laparotomy.  3. Postoperative ileus which has resolved.  4. History of gastric bypass surgery.  5. Hypertension, well controlled.  6. Diabetes mellitus diet controlled.  7. Hypothyroidism.  8. Spinal stenosis.   DISCHARGE MEDICATIONS:  The patient is informed that she may resume all  prior home medications including;  1. Coreg 12.5 daily.  2. Femara daily.  3. Levothyroxine 200 mcg daily.  4. Wellbutrin 150 mg daily.  5. Prozac 10 mg at bedtime.  6. Vicodin 7.5/750 mg as  needed.  7. Tizanidine 4 mg as needed.  8. The patient is given a prescription for Vicodin 7.5/750 mg to take      as needed for pain.  In case, the patient does not enough of her      Vicodin left at home.   DISCHARGE INSTRUCTIONS:  The patient is currently on disability, and  therefore does not work currently.  She is to resume her normal diabetic  as well as postgastrectomy diet.  She is to increase her activity slowly  and she may walk up steps.  She may shower; however, she is not to bathe  for at least the next 2 weeks.  She is not to do any heavy lifting  greater than 10 or 15 pounds for 6 weeks and she is not to drive for the  next 1 week or while taking narcotic pain medicine.  She is also  informed that when she takes the shower, she is to pat her Steri-Strips  dry and not to rub them when she is done.  Otherwise, she is to call our  office for worsening pain or recurrent symptoms such as no flatus and  increased abdominal pain or fever greater than 101.5.  Otherwise, she is  to follow up with Dr. Corliss Skains in our office in 2 weeks.      Letha Cape, PA      Lennie Muckle, MD  Electronically Signed    KEO/MEDQ  D:  07/29/2008  T:  07/29/2008  Job:  [id number]   cc:   Wilmon Arms. Tsuei, M.D.

## 2011-02-07 ENCOUNTER — Other Ambulatory Visit: Payer: Self-pay | Admitting: Rheumatology

## 2011-02-07 DIAGNOSIS — M25561 Pain in right knee: Secondary | ICD-10-CM

## 2011-02-16 ENCOUNTER — Other Ambulatory Visit: Payer: Self-pay

## 2011-02-21 ENCOUNTER — Ambulatory Visit
Admission: RE | Admit: 2011-02-21 | Discharge: 2011-02-21 | Disposition: A | Discharge status: Home / Self Care | Payer: Medicare Other | Source: Ambulatory Visit | Attending: Rheumatology | Admitting: Rheumatology

## 2011-02-21 DIAGNOSIS — M25561 Pain in right knee: Secondary | ICD-10-CM

## 2011-03-07 ENCOUNTER — Encounter: Payer: Self-pay | Admitting: Family

## 2011-03-07 ENCOUNTER — Ambulatory Visit (INDEPENDENT_AMBULATORY_CARE_PROVIDER_SITE_OTHER): Payer: Medicare Other | Admitting: Family

## 2011-03-07 ENCOUNTER — Other Ambulatory Visit: Payer: Self-pay | Admitting: Family

## 2011-03-07 DIAGNOSIS — G43909 Migraine, unspecified, not intractable, without status migrainosus: Secondary | ICD-10-CM | POA: Insufficient documentation

## 2011-03-07 DIAGNOSIS — D649 Anemia, unspecified: Secondary | ICD-10-CM

## 2011-03-07 DIAGNOSIS — R635 Abnormal weight gain: Secondary | ICD-10-CM | POA: Insufficient documentation

## 2011-03-07 DIAGNOSIS — M545 Low back pain: Secondary | ICD-10-CM | POA: Insufficient documentation

## 2011-03-07 DIAGNOSIS — I10 Essential (primary) hypertension: Secondary | ICD-10-CM

## 2011-03-07 DIAGNOSIS — M069 Rheumatoid arthritis, unspecified: Secondary | ICD-10-CM | POA: Insufficient documentation

## 2011-03-07 DIAGNOSIS — G8929 Other chronic pain: Secondary | ICD-10-CM

## 2011-03-07 DIAGNOSIS — C50919 Malignant neoplasm of unspecified site of unspecified female breast: Secondary | ICD-10-CM | POA: Insufficient documentation

## 2011-03-07 DIAGNOSIS — E119 Type 2 diabetes mellitus without complications: Secondary | ICD-10-CM

## 2011-03-07 DIAGNOSIS — E039 Hypothyroidism, unspecified: Secondary | ICD-10-CM

## 2011-03-07 DIAGNOSIS — K219 Gastro-esophageal reflux disease without esophagitis: Secondary | ICD-10-CM

## 2011-03-07 DIAGNOSIS — J069 Acute upper respiratory infection, unspecified: Secondary | ICD-10-CM | POA: Insufficient documentation

## 2011-03-07 DIAGNOSIS — F329 Major depressive disorder, single episode, unspecified: Secondary | ICD-10-CM | POA: Insufficient documentation

## 2011-03-07 LAB — BASIC METABOLIC PANEL WITH GFR
GFR, Est African American: 74 mL/min — ABNORMAL LOW (ref >89)
GFR, Est Non African American: 64 mL/min — ABNORMAL LOW (ref >89)
Potassium: 5 mEq/L (ref 3.5–5.3)
Sodium: 140 mEq/L (ref 135–145)

## 2011-03-07 LAB — HEMOGLOBIN A1C
Hgb A1c MFr Bld: 5.4 % (ref <5.7)
Mean Plasma Glucose: 108 mg/dL (ref <117)

## 2011-03-07 LAB — CBC
Hemoglobin: 11.4 g/dL — ABNORMAL LOW (ref 12.0–15.0)
MCHC: 31.8 g/dL (ref 30.0–36.0)
RBC: 3.6 MIL/uL — ABNORMAL LOW (ref 3.87–5.11)

## 2011-03-07 MED ORDER — HYDROCODONE-ACETAMINOPHEN 7.5-500 MG PO TABS: 1.0000 | ORAL_TABLET | Freq: Four times a day (QID) | ORAL | Status: DC | PRN

## 2011-03-07 MED ORDER — VENLAFAXINE HCL ER 37.5 MG PO CP24: 37.5000 mg | ORAL_CAPSULE | Freq: Every day | ORAL | Status: DC

## 2011-03-07 MED ORDER — CARVEDILOL 12.5 MG PO TABS: 12.5000 mg | ORAL_TABLET | Freq: Two times a day (BID) | ORAL | Status: DC

## 2011-03-07 MED ORDER — GABAPENTIN 300 MG PO CAPS: 300.0000 mg | ORAL_CAPSULE | Freq: Three times a day (TID) | ORAL | Status: DC | PRN

## 2011-03-07 MED ORDER — PANTOPRAZOLE SODIUM 40 MG PO TBEC: 40.0000 mg | DELAYED_RELEASE_TABLET | Freq: Every day | ORAL | Status: DC

## 2011-03-07 MED ORDER — SUMATRIPTAN SUCCINATE 50 MG PO TABS: ORAL_TABLET | ORAL | Status: DC

## 2011-03-07 MED ORDER — TIZANIDINE HCL 4 MG PO TABS: 4.0000 mg | ORAL_TABLET | Freq: Three times a day (TID) | ORAL | Status: DC

## 2011-03-07 MED ORDER — BUPROPION HCL ER (XL) 150 MG PO TB24: 150.0000 mg | ORAL_TABLET | Freq: Every day | ORAL | Status: DC

## 2011-03-07 NOTE — Patient Instructions (Addendum)
You will be contacted about your referral to opthalmology, pain management, and oncology. Schedule a medicare wellness visit in 1 month.  Please complete your lab work prior to leaving today.

## 2011-03-07 NOTE — Assessment & Plan Note (Signed)
Symptoms consistent with resolving viral URI.  Instructed pt to contact us if symptoms worsen or if they do not continue to improve.

## 2011-03-07 NOTE — Assessment & Plan Note (Signed)
Has been on imitrex in the past which helped her.  Will refill.

## 2011-03-07 NOTE — Assessment & Plan Note (Addendum)
55 yr old female with history of chronic low back pain.  She had MRI in 2011 which I have reviewed. Note was made of spinal stenosis and multilevel degenerative disc disease. She is currently on Butrans and hydrocodone 10/600 (which she tells me she has run out of).  Reviewed [place] controlled substance registry.  She had fill of hydrocodone by Dr. Cassie Freer who was her physician in [place].  I told her that I would give her a temporary refill of hydrocodone 7.5/500 and would refer her to pain management.  I told her that I would not refill her Butrans.   A controlled substance contract was signed today and pt provided a urinalysis for drug screening purposes.

## 2011-03-07 NOTE — Assessment & Plan Note (Signed)
Stable on protonix.  Continue same.  

## 2011-03-07 NOTE — Progress Notes (Signed)
Subjective:    Patient ID: Kerri Vasquez, female    DOB: 09/28/1955, 55 y.o.   MRN: [id number]  HPI  Ms.  Bubba Vasquez is a 55 yr old female who presents today to establish care.  She is in transition from [place].  1.   R breast cancer- stage 2.  This was diagnosed in 2008.  She had bilateral mastectomy with chemotherapy.  She does not have a local oncologist.  2.  Gastric bypass surgery- December 2006-  She has gained 60 pounds with prednisone. Notes highest weight of 460, lowest was 227.  3.  Rheum-  Notes that she is being followed by Dr. Dareen Piano at [facility].  4.  Hypothyroid- this was diagnosed in 1997.  She is on Levothyroxine once daily.  5.  Arthritis- Rheumatoid and Osteoarthritis-  She is following with Dr. Dareen Piano. She has been out of her hydrocodone and tizantidine.  She is on methotrexate, levothyroxine.  6.  Depression- she is using fluoxetine- she has been on this for 7-8 years.  Previously on Effexor xr. She thinks that her depression is worsened.  She reports tearfulness, wants to stay in the bed.  Denies suicide ideation.    7.  Low back pain-  She has been referred to [facility] brain/spine.  8.  Knee pain/djd- Pt is scheduled to see orthopedics for further evaluation.  9.  Migraines- she reports that this occurs about 2 times a week. She uses imitrex.   10.  GERD- She uses protonix which she is out of.   11. Diabetes type 2- Curently diet controlled.    12. URI- She reports + cough productive of yellow phlegm.  Has felt warm but no fever.  +runny nose. + nausea/vomitting for the last few days.    Review of Systems  Constitutional: Positive for unexpected weight change.  HENT: Positive for ear pain.   Eyes: Negative for visual disturbance.       She is seeing floaters.  Respiratory: Negative for shortness of breath.   Cardiovascular: Negative for chest pain.  Gastrointestinal: Positive for nausea.  Genitourinary: Negative for dysuria.  Musculoskeletal: Positive  for back pain and arthralgias.  Neurological: Positive for headaches.  Hematological: Bruises/bleeds easily.  Psychiatric/Behavioral:       See HPI   Past Medical History  Diagnosis Date  . Cancer 06/01/06    stage II breast cancer  . Depression   . Diabetes mellitus     type II  . Hypertension   . Migraine   . Hypothyroidism   . Septic arthritis 01/28/10  . Osteoarthritis of multiple joints   . Incisional hernia   . Spinal stenosis   . Scoliosis   . Sciatica   . Intestinal obstruction     History   Social History  . Marital Status: Divorced    Spouse Name: N/A    Number of Children: 1  . Years of Education: N/A   Occupational History  . Not on file.   Social History Main Topics  . Smoking status: Never Smoker   . Smokeless tobacco: Never Used  . Alcohol Use: No  . Drug Use: No  . Sexually Active: Not on file   Other Topics Concern  . Not on file   Social History Narrative   Caffeine use: 3 cups coffee/tea dailyRegular exercise: no    Past Surgical History  Procedure Date  . Cholecystectomy 06/29/85  . Tonsillectomy 1969  . Breast surgery 2008     bilateral mastectomy  .  Cystectomy 2009    left breast  . Breast reconstruction 2008, 2009  . Bariatric surgery 2006    Family History  Problem Relation Age of Onset  . Cancer Mother     breast  . Hypertension Mother     Allergies  Allergen Reactions  . Influenza Vac Split (Flu Virus Vaccine) Other (See Comments)    Joint stiffness and renal failure  . Zostavax (Zoster Vaccine Live (Oka-Merck)) Other (See Comments)    Joint stiffness, renal failure    Current Outpatient Prescriptions on File Prior to Visit  Medication Sig Dispense Refill  . docusate sodium (DULCOLAX) 100 MG capsule Take 100 mg by mouth 2 (two) times daily.        Marland Kitchen FLUoxetine (PROZAC) 20 MG capsule Take 20 mg by mouth daily.        . folic acid (FOLVITE) 1 MG tablet Take 1 mg by mouth daily.        Marland Kitchen letrozole (FEMARA) 2.5 MG  tablet Take 2.5 mg by mouth daily.        Marland Kitchen levothyroxine (SYNTHROID, LEVOTHROID) 200 MCG tablet Take 200 mcg by mouth 3 (three) times daily.         BP 130/90  Pulse 73  Temp(Src) 98 F (36.7 C) (Oral)  Resp 16  Ht 5\' 6"  (1.676 m)  Wt 278 lb 0.6 oz (126.118 kg)  BMI 44.88 kg/m2  SpO2 97%  LMP 08/30/2006       Objective:   Physical Exam  Constitutional: She is oriented to person, place, and time.       Morbidly obese white female, NAD but appears uncomfortable with walking.  HENT:  Head: Normocephalic and atraumatic.  Right Ear: Tympanic membrane and ear canal normal.  Left Ear: Tympanic membrane and ear canal normal.  Mouth/Throat: Uvula is midline, oropharynx is clear and moist and mucous membranes are normal.  Neck: Neck supple. No thyromegaly present.  Cardiovascular: Normal rate and regular rhythm.   No murmur heard. Pulmonary/Chest: Effort normal and breath sounds normal. No respiratory distress. She has no wheezes. She has no rales. She exhibits no tenderness.  Abdominal:       Small reducible incisional hernia noted on abdomen. Abdomen is soft, non-distended.  Musculoskeletal: She exhibits no edema.  Neurological: She is alert and oriented to person, place, and time. No cranial nerve deficit.  Skin: Skin is warm and dry.  Psychiatric: She has a normal mood and affect. Her behavior is normal. Judgment and thought content normal.          Assessment & Plan:

## 2011-03-07 NOTE — Assessment & Plan Note (Signed)
Check TSH 

## 2011-03-07 NOTE — Assessment & Plan Note (Signed)
Deteriorated.  On fluoxetine and wellbutrin.  She reports hx of "suicidal thoughts on cymbalta" but has tolerated Effexor in the past.  Will add effexor.

## 2011-03-07 NOTE — Assessment & Plan Note (Signed)
Pt reports that this is diet controlled.  Will check A1C, urine microalbumin.  Declines flu shot. Refer to opthalmology for diabetic eye exam.

## 2011-03-07 NOTE — Assessment & Plan Note (Signed)
She is scheduled to see orthopedics to discuss her severe knee pain. She is followed by Dr. Azzie Roup of rheumatology.

## 2011-03-07 NOTE — Assessment & Plan Note (Signed)
Will check TSH.  She is s/p gastric bypass.

## 2011-03-07 NOTE — Assessment & Plan Note (Signed)
She is s/p bilateral mastectomy and chemotherapy.  Will refer to oncology for surveillance.

## 2011-03-08 ENCOUNTER — Telehealth: Payer: Self-pay | Admitting: *Deleted

## 2011-03-08 LAB — DRUG SCREEN, URINE
Amphetamine Screen, Ur: NEGATIVE
Barbiturate Quant, Ur: POSITIVE — AB
Cocaine Metabolites: NEGATIVE
Phencyclidine (PCP): NEGATIVE

## 2011-03-08 LAB — MICROALBUMIN / CREATININE URINE RATIO
Creatinine, Urine: 205.8 mg/dL
Microalb, Ur: 0.5 mg/dL (ref 0.00–1.89)

## 2011-03-08 NOTE — Telephone Encounter (Signed)
Tests have been added per [place] at [facility].

## 2011-03-08 NOTE — Progress Notes (Signed)
  Subjective:    Patient ID: Kerri Vasquez, female    DOB: 1956/02/22, 55 y.o.   MRN: [id number]  HPI    Review of Systems     Objective:   Physical Exam        Assessment & Plan:  Urine drug tox neg for opiates.  She showed me that she was wearing a patch that she claimed was a butrans patch.  Drug tox was + for barbituates.

## 2011-03-08 NOTE — Telephone Encounter (Signed)
Message copied by Kathi Simpers on Wed Mar 08, 2011  5:02 PM ------      Message from: O'SULLIVAN, MELISSA      Created: Wed Mar 08, 2011  8:10 AM       Could you pls see if lab can add on B12 and folate levels- diagnosis folic acid deficiency

## 2011-03-09 LAB — VITAMIN B12: Vitamin B-12: 277 pg/mL (ref 211–911)

## 2011-03-09 LAB — FOLATE: Folate: >20 ng/mL

## 2011-03-10 ENCOUNTER — Encounter: Payer: Self-pay | Admitting: Family

## 2011-03-14 ENCOUNTER — Telehealth: Payer: Self-pay | Admitting: *Deleted

## 2011-03-14 NOTE — Telephone Encounter (Signed)
Records received from Dr Dareen Piano with [facility] and forwarded to Provider for review.

## 2011-03-21 ENCOUNTER — Telehealth: Payer: Self-pay | Admitting: *Deleted

## 2011-03-21 NOTE — Telephone Encounter (Signed)
Called to schedule appointment, person that answered wouldn't take a message said Pt was at another MD office.

## 2011-03-22 ENCOUNTER — Telehealth: Payer: Self-pay | Admitting: *Deleted

## 2011-03-22 NOTE — Telephone Encounter (Signed)
Pt aware of 04-28-11 appointment

## 2011-04-03 ENCOUNTER — Telehealth: Payer: Self-pay | Admitting: Family

## 2011-04-03 ENCOUNTER — Telehealth: Payer: Self-pay | Admitting: *Deleted

## 2011-04-03 NOTE — Telephone Encounter (Signed)
Records received from Dr Julio Sicks and forwarded to Provider for review.

## 2011-04-03 NOTE — Telephone Encounter (Signed)
Received medical records from [facility]-Dr. Greggory Stallion Osei-Bonsu

## 2011-04-10 ENCOUNTER — Encounter: Payer: Medicare Other | Admitting: Family

## 2011-04-19 ENCOUNTER — Encounter: Payer: Medicare Other | Admitting: Family

## 2011-04-23 ENCOUNTER — Encounter: Payer: Self-pay | Admitting: Family

## 2011-04-27 ENCOUNTER — Other Ambulatory Visit: Payer: Self-pay | Admitting: Hematology & Oncology

## 2011-04-27 DIAGNOSIS — C50919 Malignant neoplasm of unspecified site of unspecified female breast: Secondary | ICD-10-CM

## 2011-04-27 DIAGNOSIS — D638 Anemia in other chronic diseases classified elsewhere: Secondary | ICD-10-CM

## 2011-04-27 DIAGNOSIS — M810 Age-related osteoporosis without current pathological fracture: Secondary | ICD-10-CM

## 2011-04-28 ENCOUNTER — Ambulatory Visit: Payer: Medicare Other | Admitting: Hematology & Oncology

## 2011-04-28 ENCOUNTER — Ambulatory Visit: Payer: Medicare Other

## 2011-04-28 ENCOUNTER — Other Ambulatory Visit: Payer: Medicare Other | Admitting: Lab

## 2011-05-02 DIAGNOSIS — C73 Malignant neoplasm of thyroid gland: Secondary | ICD-10-CM

## 2011-05-02 HISTORY — DX: Malignant neoplasm of thyroid gland: C73

## 2011-05-04 ENCOUNTER — Inpatient Hospital Stay (HOSPITAL_COMMUNITY): Admission: RE | Admit: 2011-05-04 | Payer: Medicare Other | Source: Ambulatory Visit

## 2011-05-05 ENCOUNTER — Encounter: Payer: Medicare Other | Admitting: Family

## 2011-05-05 DIAGNOSIS — Z0289 Encounter for other administrative examinations: Secondary | ICD-10-CM

## 2011-05-15 ENCOUNTER — Telehealth: Payer: Self-pay | Admitting: Hematology & Oncology

## 2011-05-15 ENCOUNTER — Telehealth: Payer: Self-pay | Admitting: Family

## 2011-05-15 NOTE — Telephone Encounter (Signed)
Helen at referring is aware Pt was no show for 12-28. Left Pt message to call and reschedule.

## 2011-05-15 NOTE — Telephone Encounter (Signed)
Rick from    [facility],  Dr Sammuel Hines to let us know pt was a  NS   For December 28,2012

## 2011-05-16 ENCOUNTER — Encounter: Payer: Self-pay | Admitting: Family

## 2011-05-16 ENCOUNTER — Ambulatory Visit (INDEPENDENT_AMBULATORY_CARE_PROVIDER_SITE_OTHER): Payer: Medicare Other | Admitting: Family

## 2011-05-16 VITALS — BP 140/90 | HR 90 | Temp 97.7°F | Resp 18 | Ht 66.0 in | Wt 287.0 lb

## 2011-05-16 DIAGNOSIS — D649 Anemia, unspecified: Secondary | ICD-10-CM

## 2011-05-16 DIAGNOSIS — Z853 Personal history of malignant neoplasm of breast: Secondary | ICD-10-CM | POA: Diagnosis not present

## 2011-05-16 DIAGNOSIS — G8929 Other chronic pain: Secondary | ICD-10-CM

## 2011-05-16 DIAGNOSIS — R351 Nocturia: Secondary | ICD-10-CM

## 2011-05-16 DIAGNOSIS — M1990 Unspecified osteoarthritis, unspecified site: Secondary | ICD-10-CM | POA: Insufficient documentation

## 2011-05-16 DIAGNOSIS — Z01818 Encounter for other preprocedural examination: Secondary | ICD-10-CM | POA: Diagnosis not present

## 2011-05-16 DIAGNOSIS — E119 Type 2 diabetes mellitus without complications: Secondary | ICD-10-CM | POA: Diagnosis not present

## 2011-05-16 DIAGNOSIS — Z862 Personal history of diseases of the blood and blood-forming organs and certain disorders involving the immune mechanism: Secondary | ICD-10-CM | POA: Diagnosis not present

## 2011-05-16 DIAGNOSIS — M545 Low back pain: Secondary | ICD-10-CM

## 2011-05-16 MED ORDER — PANTOPRAZOLE SODIUM 40 MG PO TBEC: 40.0000 mg | DELAYED_RELEASE_TABLET | Freq: Every day | ORAL | Status: DC

## 2011-05-16 MED ORDER — VENLAFAXINE HCL ER 37.5 MG PO CP24: 37.5000 mg | ORAL_CAPSULE | Freq: Every day | ORAL | Status: DC

## 2011-05-16 NOTE — Assessment & Plan Note (Signed)
56 yr old female with DJD scheduled for TKA later this month.  Obtain UA, CXR, labs as below prior to clearing her for surgery.  She will need close monitoring in SDU after surgery due to hx of OSA.

## 2011-05-16 NOTE — Progress Notes (Signed)
Subjective:    Patient ID: Kerri Vasquez, female    DOB: February 10, 1956, 56 y.o.   MRN: [id number]  HPI  Kerri Vasquez is a 56 yr old female who presents today for follow up and for pre-surgical evaluation.    She is scheduled for a TKR on 3/29 with Dr. Charlann Boxer.  She reports ongoing pain in her right knee.    Chronic pain- the patient no-showed her appointment with pain management.   Hx of breast cancer- patient no-showed her appointment with Dr. [name].  History or OSA-  She used to wear a CPAP before she lost weight.   She reports that she feels really tired.  Feels achey all over.       Review of Systems See HPI  Past Medical History  Diagnosis Date  . Cancer 06/01/06    stage II breast cancer  . Depression   . Diabetes mellitus     type II  . Hypertension   . Migraine   . Hypothyroidism   . Septic arthritis 01/28/10  . Osteoarthritis of multiple joints   . Incisional hernia   . Spinal stenosis   . Scoliosis   . Sciatica   . Intestinal obstruction   . Renal insufficiency     History   Social History  . Marital Status: Divorced    Spouse Name: N/A    Number of Children: 1  . Years of Education: N/A   Occupational History  . Not on file.   Social History Main Topics  . Smoking status: Never Smoker   . Smokeless tobacco: Never Used  . Alcohol Use: No  . Drug Use: No  . Sexually Active: Not on file   Other Topics Concern  . Not on file   Social History Narrative   Caffeine use: 3 cups coffee/tea dailyRegular exercise: no    Past Surgical History  Procedure Date  . Cholecystectomy 06/29/85  . Tonsillectomy 1969  . Breast surgery 2008     bilateral mastectomy  . Cystectomy 2009    left breast  . Breast reconstruction 2008, 2009  . Bariatric surgery 2006    Family History  Problem Relation Age of Onset  . Cancer Mother     breast  . Hypertension Mother     Allergies  Allergen Reactions  . Influenza Vac Split (Flu Virus Vaccine) Other (See  Comments)    Joint stiffness and renal failure  . Zostavax (Zoster Vaccine Live (Oka-Merck)) Other (See Comments)    Joint stiffness, renal failure    Current Outpatient Prescriptions on File Prior to Visit  Medication Sig Dispense Refill  . Buprenorphine (BUTRANS) 10 MCG/HR PTWK Place onto the skin. Apply and change every 7 days       . buPROPion (WELLBUTRIN XL) 150 MG 24 hr tablet Take 1 tablet (150 mg total) by mouth daily.  30 tablet  2  . carvedilol (COREG) 12.5 MG tablet Take 1 tablet (12.5 mg total) by mouth 2 (two) times daily.  60 tablet  2  . docusate sodium (DULCOLAX) 100 MG capsule Take 100 mg by mouth 2 (two) times daily.        Marland Kitchen FLUoxetine (PROZAC) 20 MG capsule Take 20 mg by mouth daily.        . folic acid (FOLVITE) 1 MG tablet Take 1 mg by mouth daily.        Marland Kitchen gabapentin (NEURONTIN) 300 MG capsule Take 1 capsule (300 mg total) by mouth every 8 (  eight) hours as needed.  90 capsule  2  . letrozole (FEMARA) 2.5 MG tablet Take 2.5 mg by mouth daily.        Marland Kitchen levothyroxine (SYNTHROID, LEVOTHROID) 200 MCG tablet Take 200 mcg by mouth 3 (three) times daily.       Marland Kitchen lidocaine (XYLOCAINE) 5 % ointment Apply 1 application topically 2 (two) times daily.        . magnesium oxide (MAG-OX) 400 MG tablet Take 400 mg by mouth daily.        Marland Kitchen METHOTREXATE SODIUM, PF, IJ Inject as directed. Inject 1mL subcutaneously once a week.       . PredniSONE 5 MG TBEC Take 5 mg by mouth daily.        . SUMAtriptan (IMITREX) 50 MG tablet One tablet by mouth at start of migraine. May repeat in 2 hours once in 24 hrs as needed  10 tablet  2  . tiZANidine (ZANAFLEX) 4 MG tablet Take 1 tablet (4 mg total) by mouth 3 (three) times daily.  90 tablet  0    BP 140/90  Pulse 90  Temp(Src) 97.7 F (36.5 C) (Oral)  Resp 18  Ht 5\' 6"  (1.676 m)  Wt 287 lb (130.182 kg)  BMI 46.32 kg/m2       Objective:   Physical Exam  Constitutional: She appears well-developed and well-nourished. No distress.    Cardiovascular: Normal rate and regular rhythm.   No murmur heard. Pulmonary/Chest: Effort normal and breath sounds normal. No respiratory distress. She has no wheezes. She has no rales. She exhibits no tenderness.  Musculoskeletal:       Trace bilateral LE edema.   Right knee effusion is noted.   Psychiatric: She has a normal mood and affect. Her behavior is normal. Judgment and thought content normal.          Assessment & Plan:  25 minutes spent with pt today.  > 50% of this time was spent counseling pt on the importance of compliance and pain contract.

## 2011-05-16 NOTE — Patient Instructions (Signed)
Please complete your lab work and chest x-ray prior to leaving today.

## 2011-05-16 NOTE — Assessment & Plan Note (Signed)
Pt no showed her pain clinic referral.  I declined to give her any further refills of her narcotics or muscle relaxants.  Further review of the [place] controlled substance registry later shows that the patient has been receiving narcotics under her maiden name Liz Pinho.  She has had narcotics filled by another provider after signing controlled substance contract with our office.

## 2011-05-17 ENCOUNTER — Telehealth (INDEPENDENT_AMBULATORY_CARE_PROVIDER_SITE_OTHER): Payer: Medicare Other | Admitting: *Deleted

## 2011-05-17 ENCOUNTER — Encounter (HOSPITAL_COMMUNITY): Payer: Self-pay | Admitting: Pharmacy Technician

## 2011-05-17 DIAGNOSIS — N390 Urinary tract infection, site not specified: Secondary | ICD-10-CM

## 2011-05-17 DIAGNOSIS — Z01818 Encounter for other preprocedural examination: Secondary | ICD-10-CM

## 2011-05-17 LAB — CBC WITH DIFFERENTIAL/PLATELET
Eosinophils Absolute: 0.2 10*3/uL (ref 0.0–0.7)
Lymphocytes Relative: 18 % (ref 12–46)
Lymphs Abs: 1.3 10*3/uL (ref 0.7–4.0)
Neutro Abs: 5.5 10*3/uL (ref 1.7–7.7)
Neutrophils Relative %: 74 % (ref 43–77)
Platelets: 295 10*3/uL (ref 150–400)
RBC: 3.69 MIL/uL — ABNORMAL LOW (ref 3.87–5.11)
WBC: 7.4 10*3/uL (ref 4.0–10.5)

## 2011-05-17 LAB — BASIC METABOLIC PANEL
CO2: 22 mEq/L (ref 19–32)
Calcium: 8.9 mg/dL (ref 8.4–10.5)
Chloride: 106 mEq/L (ref 96–112)
Potassium: 4.1 mEq/L (ref 3.5–5.3)
Sodium: 139 mEq/L (ref 135–145)

## 2011-05-17 NOTE — Telephone Encounter (Signed)
Pt did not leave urine sample yesterday. Per Sandford Craze, NP we should do a urine dip and then culture if dip is positive before pt has knee replacement. Left message for pt to return my call.

## 2011-05-18 DIAGNOSIS — M1710 Unilateral primary osteoarthritis, unspecified knee: Secondary | ICD-10-CM | POA: Diagnosis not present

## 2011-05-18 DIAGNOSIS — M25569 Pain in unspecified knee: Secondary | ICD-10-CM | POA: Diagnosis not present

## 2011-05-18 NOTE — Telephone Encounter (Signed)
Left detailed message at pt's home number to call re: need to do u/a and culture if positive prior to having knee surgery.

## 2011-05-21 NOTE — H&P (Signed)
Kerri Vasquez is an 56 y.o. female.    Chief Complaint: right knee OA and pain   HPI: Pt is a 56 y.o. female complaining of right knee pain for many years. Pain had continually increased since the beginning. X-rays in the clinic show end-stage arthritic changes of the right knee. Pt has tried various conservative treatments which have failed to alleviate their symptoms, including NSAIDs and steroid injections. Various options are discussed with the patient. Risks, benefits and expectations were discussed with the patient. Patient understand the risks, benefits and expectations and wishes to proceed with surgery.   PCP:  Lemont Fillers., NP, NP  D/C Plans:  Home with HHPT  Post-op Meds:   Rx given for Xarelto, Robaxin, Iron, Colace and MiraLax  Tranexamic Acid:  Not to be given  PMH: Past Medical History  Diagnosis Date  . Cancer 06/01/06    stage II breast cancer  . Depression   . Diabetes mellitus     type II  . Hypertension   . Migraine   . Hypothyroidism   . Septic arthritis 01/28/10  . Osteoarthritis of multiple joints   . Incisional hernia   . Spinal stenosis   . Scoliosis   . Sciatica   . Intestinal obstruction   . Renal insufficiency     PSH: Past Surgical History  Procedure Date  . Cholecystectomy 06/29/85  . Tonsillectomy 1969  . Breast surgery 2008     bilateral mastectomy  . Cystectomy 2009    left breast  . Breast reconstruction 2008, 2009  . Bariatric surgery 2006    Social History:  reports that she has never smoked. She has never used smokeless tobacco. She reports that she does not drink alcohol or use illicit drugs.  Allergies:  Allergies  Allergen Reactions  . Influenza Vac Split (Flu Virus Vaccine) Other (See Comments)    Joint stiffness and renal failure  . Zostavax (Zoster Vaccine Live (Oka-Merck)) Other (See Comments)    Joint stiffness, renal failure  . Adhesive (Tape) Itching and Rash    Medications: No current  facility-administered medications for this encounter.   Current Outpatient Prescriptions  Medication Sig Dispense Refill  . Buprenorphine (BUTRANS) 10 MCG/HR PTWK Place 1 patch onto the skin every Tuesday. Apply and change every 7 days      . buPROPion (WELLBUTRIN XL) 150 MG 24 hr tablet Take 150 mg by mouth 2 (two) times daily.      . Butalbital-APAP-Caffeine (FIORICET PO) Take 2 tablets by mouth every 8 (eight) hours as needed. migraines      . carvedilol (COREG) 12.5 MG tablet Take 1 tablet (12.5 mg total) by mouth 2 (two) times daily.  60 tablet  2  . diclofenac (VOLTAREN) 75 MG EC tablet Take 75 mg by mouth daily.       Marland Kitchen docusate sodium (DULCOLAX) 100 MG capsule Take 100 mg by mouth daily.       Marland Kitchen FLUoxetine (PROZAC) 20 MG capsule Take 20 mg by mouth 2 (two) times daily.       . folic acid (FOLVITE) 1 MG tablet Take 2 mg by mouth daily.       Marland Kitchen gabapentin (NEURONTIN) 300 MG capsule Take 900 mg by mouth at bedtime.      Marland Kitchen letrozole (FEMARA) 2.5 MG tablet Take 2.5 mg by mouth daily.        Marland Kitchen levothyroxine (SYNTHROID, LEVOTHROID) 200 MCG tablet Take 200 mcg by mouth daily before breakfast.       .  lidocaine (XYLOCAINE) 5 % ointment Apply 1 application topically 2 (two) times daily.        . magnesium oxide (MAG-OX) 400 MG tablet Take 400 mg by mouth daily.        Marland Kitchen METHOTREXATE SODIUM, PF, IJ Inject 25 mg as directed every Wednesday. Inject 1mL subcutaneously once a week.      . pantoprazole (PROTONIX) 40 MG tablet Take 1 tablet (40 mg total) by mouth daily.  90 tablet  0  . PredniSONE 5 MG TBEC Take 5 mg by mouth daily.        . SUMAtriptan (IMITREX) 50 MG tablet Take 50 mg by mouth every 2 (two) hours as needed. One tablet by mouth at start of migraine. May repeat in 2 hours once in 24 hrs as needed      . tiZANidine (ZANAFLEX) 4 MG tablet Take 1 tablet (4 mg total) by mouth 3 (three) times daily.  90 tablet  0  . venlafaxine (EFFEXOR-XR) 37.5 MG 24 hr capsule Take 37.5 mg by mouth at  bedtime.         ROS: Review of Systems  Constitutional: Negative.   HENT: Positive for neck pain.   Eyes: Negative.   Respiratory: Negative.   Cardiovascular: Negative.   Gastrointestinal: Negative.   Genitourinary: Negative.   Musculoskeletal: Positive for myalgias, back pain and joint pain.  Skin: Negative.   Neurological: Negative.   Endo/Heme/Allergies: Negative.   Psychiatric/Behavioral: Negative.      Physican Exam: Physical Exam  Constitutional: She is oriented to person, place, and time and well-developed, well-nourished, and in no distress.  HENT:  Head: Normocephalic and atraumatic.  Nose: Nose normal.  Mouth/Throat: Oropharynx is clear and moist.  Eyes: Pupils are equal, round, and reactive to light.  Neck: Neck supple. No JVD present. No tracheal deviation present. No thyromegaly present.  Cardiovascular: Normal rate, regular rhythm and normal heart sounds.   Pulmonary/Chest: Effort normal and breath sounds normal. No stridor. No respiratory distress. She has no wheezes. She has no rales. She exhibits no tenderness.  Abdominal: Soft. There is no tenderness. There is no guarding.  Musculoskeletal:       Right knee: She exhibits decreased range of motion (10-90), swelling and bony tenderness. She exhibits no effusion, no ecchymosis, no deformity, no laceration and no erythema.  Lymphadenopathy:    She has no cervical adenopathy.  Neurological: She is alert and oriented to person, place, and time.  Skin: Skin is warm and dry.     Assessment/Plan Assessment: right knee OA and pain   Plan: Patient will undergo a right total knee arthroplasty on 05/30/2011. Risks benefits and expectation were discussed with the patient. Patient understand risks, benefits and expectation and wishes to proceed.   Anastasio Auerbach Hadiya Spoerl   PAC  05/21/2011, 1:17 PM

## 2011-05-23 ENCOUNTER — Encounter: Payer: Self-pay | Admitting: Family

## 2011-05-23 NOTE — Telephone Encounter (Signed)
Pt has not returned my calls. Letter has been mailed to pt notifying her that surgical clearance cannot be given until CXR and u/a have been completed.

## 2011-05-24 ENCOUNTER — Encounter: Payer: Self-pay | Admitting: *Deleted

## 2011-05-24 ENCOUNTER — Ambulatory Visit (HOSPITAL_BASED_OUTPATIENT_CLINIC_OR_DEPARTMENT_OTHER)
Admission: RE | Admit: 2011-05-24 | Discharge: 2011-05-24 | Disposition: A | Discharge status: Home / Self Care | Payer: Medicare Other | Source: Ambulatory Visit | Attending: Family | Admitting: Family

## 2011-05-24 DIAGNOSIS — N390 Urinary tract infection, site not specified: Secondary | ICD-10-CM | POA: Diagnosis not present

## 2011-05-24 DIAGNOSIS — C50919 Malignant neoplasm of unspecified site of unspecified female breast: Secondary | ICD-10-CM | POA: Insufficient documentation

## 2011-05-24 DIAGNOSIS — Z01818 Encounter for other preprocedural examination: Secondary | ICD-10-CM

## 2011-05-24 DIAGNOSIS — Z01811 Encounter for preprocedural respiratory examination: Secondary | ICD-10-CM | POA: Insufficient documentation

## 2011-05-24 LAB — POCT URINALYSIS DIPSTICK
Ketones, UA: NEGATIVE
Protein, UA: 30

## 2011-05-24 NOTE — Telephone Encounter (Signed)
Pt presented to office and provided urine specimen; see u/a results. Urine has been cultured.

## 2011-05-24 NOTE — Telephone Encounter (Signed)
Addended by: Mervin Kung A on: 05/24/2011 04:31 PM   Modules accepted: Orders

## 2011-05-24 NOTE — Telephone Encounter (Signed)
Received call from pt's daughter that they will return today for CXR and u/a.

## 2011-05-24 NOTE — Telephone Encounter (Signed)
Opened in error

## 2011-05-25 ENCOUNTER — Other Ambulatory Visit: Payer: Self-pay

## 2011-05-25 ENCOUNTER — Ambulatory Visit: Payer: Medicare Other

## 2011-05-25 ENCOUNTER — Encounter (HOSPITAL_COMMUNITY)
Admission: RE | Admit: 2011-05-25 | Discharge: 2011-05-25 | Disposition: A | Discharge status: Home / Self Care | Payer: Medicare Other | Source: Ambulatory Visit | Attending: Orthopedic Surgery | Admitting: Orthopedic Surgery

## 2011-05-25 ENCOUNTER — Ambulatory Visit (HOSPITAL_COMMUNITY)
Admission: RE | Admit: 2011-05-25 | Discharge: 2011-05-25 | Disposition: A | Discharge status: Home / Self Care | Payer: Medicare Other | Source: Ambulatory Visit | Attending: Orthopedic Surgery | Admitting: Orthopedic Surgery

## 2011-05-25 ENCOUNTER — Other Ambulatory Visit (HOSPITAL_BASED_OUTPATIENT_CLINIC_OR_DEPARTMENT_OTHER): Payer: Medicare Other | Admitting: Lab

## 2011-05-25 ENCOUNTER — Encounter (HOSPITAL_COMMUNITY): Payer: Self-pay

## 2011-05-25 ENCOUNTER — Ambulatory Visit (HOSPITAL_BASED_OUTPATIENT_CLINIC_OR_DEPARTMENT_OTHER): Payer: Medicare Other | Admitting: Hematology & Oncology

## 2011-05-25 VITALS — HR 68 | Temp 97.6°F | Ht 67.0 in | Wt 285.0 lb

## 2011-05-25 DIAGNOSIS — D649 Anemia, unspecified: Secondary | ICD-10-CM

## 2011-05-25 DIAGNOSIS — D509 Iron deficiency anemia, unspecified: Secondary | ICD-10-CM

## 2011-05-25 DIAGNOSIS — Z170 Estrogen receptor positive status [ER+]: Secondary | ICD-10-CM | POA: Diagnosis not present

## 2011-05-25 DIAGNOSIS — Z01812 Encounter for preprocedural laboratory examination: Secondary | ICD-10-CM | POA: Insufficient documentation

## 2011-05-25 DIAGNOSIS — D638 Anemia in other chronic diseases classified elsewhere: Secondary | ICD-10-CM

## 2011-05-25 DIAGNOSIS — C50919 Malignant neoplasm of unspecified site of unspecified female breast: Secondary | ICD-10-CM | POA: Diagnosis not present

## 2011-05-25 DIAGNOSIS — D510 Vitamin B12 deficiency anemia due to intrinsic factor deficiency: Secondary | ICD-10-CM

## 2011-05-25 DIAGNOSIS — Z853 Personal history of malignant neoplasm of breast: Secondary | ICD-10-CM | POA: Insufficient documentation

## 2011-05-25 DIAGNOSIS — Z01811 Encounter for preprocedural respiratory examination: Secondary | ICD-10-CM | POA: Diagnosis not present

## 2011-05-25 DIAGNOSIS — M810 Age-related osteoporosis without current pathological fracture: Secondary | ICD-10-CM

## 2011-05-25 DIAGNOSIS — Z9884 Bariatric surgery status: Secondary | ICD-10-CM | POA: Diagnosis not present

## 2011-05-25 DIAGNOSIS — E119 Type 2 diabetes mellitus without complications: Secondary | ICD-10-CM | POA: Diagnosis not present

## 2011-05-25 DIAGNOSIS — D520 Dietary folate deficiency anemia: Secondary | ICD-10-CM

## 2011-05-25 DIAGNOSIS — Z01818 Encounter for other preprocedural examination: Secondary | ICD-10-CM | POA: Insufficient documentation

## 2011-05-25 DIAGNOSIS — E559 Vitamin D deficiency, unspecified: Secondary | ICD-10-CM

## 2011-05-25 DIAGNOSIS — E079 Disorder of thyroid, unspecified: Secondary | ICD-10-CM | POA: Insufficient documentation

## 2011-05-25 DIAGNOSIS — Z01810 Encounter for preprocedural cardiovascular examination: Secondary | ICD-10-CM | POA: Insufficient documentation

## 2011-05-25 DIAGNOSIS — E0789 Other specified disorders of thyroid: Secondary | ICD-10-CM | POA: Diagnosis not present

## 2011-05-25 HISTORY — DX: Sleep apnea, unspecified: G47.30

## 2011-05-25 HISTORY — DX: Anemia, unspecified: D64.9

## 2011-05-25 HISTORY — DX: Gastro-esophageal reflux disease without esophagitis: K21.9

## 2011-05-25 LAB — DIFFERENTIAL
Basophils Relative: 1 % (ref 0–1)
Eosinophils Absolute: 0.2 10*3/uL (ref 0.0–0.7)
Neutrophils Relative %: 69 % (ref 43–77)

## 2011-05-25 LAB — CBC WITH DIFFERENTIAL (CANCER CENTER ONLY)
BASO#: 0 10*3/uL (ref 0.0–0.2)
Eosinophils Absolute: 0.1 10*3/uL (ref 0.0–0.5)
HCT: 33 % — ABNORMAL LOW (ref 34.8–46.6)
HGB: 10.8 g/dL — ABNORMAL LOW (ref 11.6–15.9)
LYMPH#: 1.4 10*3/uL (ref 0.9–3.3)
NEUT#: 5.8 10*3/uL (ref 1.5–6.5)
NEUT%: 74.2 % (ref 39.6–80.0)
RBC: 3.41 10*6/uL — ABNORMAL LOW (ref 3.70–5.32)

## 2011-05-25 LAB — CBC
MCH: 31 pg (ref 26.0–34.0)
MCHC: 32.2 g/dL (ref 30.0–36.0)
Platelets: 333 10*3/uL (ref 150–400)
RBC: 3.74 MIL/uL — ABNORMAL LOW (ref 3.87–5.11)
RDW: 14.7 % (ref 11.5–15.5)

## 2011-05-25 LAB — URINE MICROSCOPIC-ADD ON

## 2011-05-25 LAB — BASIC METABOLIC PANEL
BUN: 21 mg/dL (ref 6–23)
Calcium: 9.5 mg/dL (ref 8.4–10.5)
GFR calc non Af Amer: 58 mL/min — ABNORMAL LOW (ref >90)
Glucose, Bld: 55 mg/dL — ABNORMAL LOW (ref 70–99)
Sodium: 140 mEq/L (ref 135–145)

## 2011-05-25 LAB — URINALYSIS, ROUTINE W REFLEX MICROSCOPIC
Glucose, UA: NEGATIVE mg/dL
Hgb urine dipstick: NEGATIVE
Protein, ur: NEGATIVE mg/dL
Specific Gravity, Urine: 1.03 (ref 1.005–1.030)
Urobilinogen, UA: 0.2 mg/dL (ref 0.0–1.0)

## 2011-05-25 LAB — SURGICAL PCR SCREEN
MRSA, PCR: POSITIVE — AB
Staphylococcus aureus: POSITIVE — AB

## 2011-05-25 MED ORDER — CEFAZOLIN SODIUM 1-5 GM-% IV SOLN: 1.0000 g | INTRAVENOUS | Status: DC

## 2011-05-25 NOTE — Pre-Procedure Instructions (Signed)
05/25/11 NO B/P , NEEDLES, RIGHT ARM

## 2011-05-25 NOTE — Patient Instructions (Signed)
[address]  05/25/2011   Your procedure is scheduled on:  05/30/11 345pm-500pm  Report to [facility] at 145 pm.  Call this number if you have problems the morning of surgery: [phone]   Remember:   Do not eat food:After Midnight.  May have clear liquids:until 0900am then npo .  Marland Kitchen  Take these medicines the morning of surgery with A SIP OF WATER:   Do not wear jewelry, make-up or nail polish.  Do not wear lotions, powders, or perfumes.   Do not shave 48 hours prior to surgery.  Do not bring valuables to the hospital.  Contacts, dentures or bridgework may not be worn into surgery.  Leave suitcase in the car. After surgery it may be brought to your room.  For patients admitted to the hospital, checkout time is 11:00 AM the day of discharge.       Special Instructions: CHG Shower Use Special Wash: 1/2 bottle night before surgery and 1/2 bottle morning of surgery. Shower chin to toes with CHG.  Wash face and private parts with regular soap.l     Please read over the following fact sheets that you were given: MRSA Information, Incentive Spirometry Fact, Sheet, Blood Transfusion Fact Sheet, coughing and deep breathing exercises, leg exercises

## 2011-05-25 NOTE — Progress Notes (Signed)
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  HISTORY OF PRESENT ILLNESS:  [name] is a very nice 56 year old Caucasian female.  She is from [place].  She is from [place].  [place] or 5 [place].  She was diagnosed with a stage II breast cancer of the right breast back in 2008.  She underwent bilateral mastectomy.  She had a chemotherapy. It sounds like her chemotherapy was Adriamycin/Cytoxan followed by Taxotere.  She was then put on tamoxifen for year and then Femara.  She does have a history of gastric bypass.  This had been done up in [place] in 2006.  She apparently developed septic shock in October 2011.  She did a group B strep in the blood.  She developed septic arthritis which ultimately was diagnosed as rheumatoid arthritis.  She sees a Dr. Dareen Piano of Rheumatology.  She is on methotrexate in addition to prednisone.  The prednisone dose is being tapered.  She needs to have her right knee replaced.  This is going to be done next week.  She has, surprising enough, not see an oncologist since she has been down here.  She does tell me that she did go back up to [place] to see her oncologist up there.  However, this was getting to be too expensive.  Her daughter can with her.  Her daughter is incredibly diligent with making sure that her mom gets the proper attention.  [name] does have diabetes, hypothyroidism, migraines.  She is having problems with her implants.  She had saline implants.  She has been having pain with these implants.  She says she wants to be seen by a plastic surgeon so she could have these we replaced.  Her daughter is not too keen on her getting new implants.  She does have some pain issues because the arthritis.  She does have the problems with her right knee and is going to get this replaced.  She has not had any  change in bowel or bladder habits.  She has not had any cough.  There has been occasional migraine headaches but nothing recently.  She has had no leg swelling although maybe some slight swelling in the right leg.  We were asked to see her by Sandford Craze for ongoing surveillance.  PAST MEDICAL HISTORY: 1. Stage II (node positive) ductal carcinoma of the right breast.  Her     tumor is obviously ER positive.  She underwent bilateral     mastectomy.  She said that she did have the BRCA1 and 2 analysis.     She is negative for this. 2. Rheumatoid arthritis. 3. Hypothyroidism. 4. Depression. 5. Degenerative arthritis status post upcoming right knee replacement. 6. Migraines. 7. GERD. 8. Morbid obesity status post gastric bypass in 2006.  ALLERGIES: 1. Influenza vaccine. 2. Zoster vaccine. 3. Adhesive tape.  MEDICATIONS:  Wellbutrin XL 150 mg p.o. b.i.d., Fioricet 2 p.o. q.8 hours p.r.n., Coreg 12.5 mg p.o. b.i.d., Voltaren 75 mg p.o. q. day, Dulcolax 1 p.o. daily, Prozac 20 mg p.o. b.i.d., folic acid 2 mg p.o. q. day, Neurontin 300 mg p.o. q.h.s., __________ mg p.o. q.h.s., Femara 2.5 mg p.o. q. day, Synthroid 0.2 mg p.o. q. day, methotrexate 25 mg IM weekly, Protonix 40 mg p.o. q. day, prednisone 5 mg p.o. q. day, Imitrex 50 mg  p.o. q.2 hours p.r.n., Zanaflex 4 mg p.o. t.i.d., Effexor XR 37.5 mg p.o. q.h.s., Butrans patch 10 mcg weekly.  SOCIAL HISTORY:  Negative for drug or tobacco use.  There is no significant alcohol use.  She has no occupational exposures.  FAMILY HISTORY:  Remarkable for her mother who had breast cancer 3 months after she was diagnosed herself.  There is a history of lung cancer in the family.  Also, history of diabetes, hypertension.  REVIEW OF SYSTEMS:  As stated in history of present illness.  No additional findings were noted on a 12-system review.  PHYSICAL EXAM:  General:  This is an obese white female in no obvious distress.  Vital Signs:   Temperature 97, pulse 78, respiratory 18, blood pressure 147/90.  Weight is 283.  Head and Neck Exam:  Normocephalic, atraumatic skull.  There are no ocular or oral lesions.  There are no palpable cervical or supraclavicular lymph nodes.  Lungs:  Clear to percussion and auscultation bilaterally.  Cardiac Exam:  Regular rate and rhythm with normal S1 and S2.  She has a 1/6 systolic ejection murmur.  Breast Exam:  Bilateral mastectomies.  She has implants.  She has some asymmetry of the implants.  There is no obvious chest wall mass.  There is no erythema or nodularity noted about the implants. There is no bilateral axillary adenopathy.  Abdominal Exam:  Soft, obese.  She has multiple laparotomy scars.  She has laparoscopy scars. She has no fluid wave.  No guarding, no  rebound, or tenderness.  She has no palpable hepatosplenomegaly.  Back Exam:  No tenderness over the spine, ribs, or hips.  Extremities:  Maybe some slight nonpitting edema of the right arm.  She has good range of motion of her joints.  She does have some swelling of the right knee.  This is some crepitus of the right knee with movement.  She has some slight stasis dermatitis changes in the lower extremities.  Skin Exam:  Stasis dermatitis changes. Neurological Exam:  No focal neurological deficits.  LABORATORY STUDIES:  White cell count is 7.8, hemoglobin 10.8, hematocrit 33, platelet count 298.  MCV is 97.  Peripheral smear shows some mild anisocytosis and poikilocytosis.  There are some mild hypochromic and microcytic red cells.  There are some microcytic red cells.  There is some slight polychromasia.  There are no nucleated red blood cells.  I see no teardrop cells.  She has no rouleaux formation.  White cells appear normal in morphology and maturation.  There are no hypersegmented polys.  I see no blasts.  There are no atypical lymphocytes.  Platelets are adequate in number and size.  IMPRESSION:  [name] is a  56 year old white female with a history of stage II ductal carcinoma of the right breast.  Somehow, we are going to have to get some more information on her breast cancer from her oncologist in [place].  We will have to speak with her and find out who her oncologist is to get some information.  She does have some arthritis with the Femara.  She was wondering if she could switch to something different.  We will see what her vitamin D level is.  Typically, high doses of vitamin D can help with aromatase inhibitor-induced arthralgias.  However, given her gastric bypass, I am not sure she would absorb vitamin D.  We may want to switch to Aromasin. She is going to have knee surgery next week.  She is  anemic.  One has to suspect that she probably is iron deficient. With her gastric bypass done 7 years ago, she is not going to absorb iron.  As such, she may need to have IV iron given to her.  We will have check to see what her iron stores are.  She also may be B12 deficient.  Again, she has gastric bypass so she probably has no absorption at all of any B12.  We will have to check her B12 level.  We will have to see about getting her to a plastic surgeon for her implant revision.  I am not sure what plastic surgeon we will send her to.  [name] has a ton of issues going on.  The main issue right now is just getting her through the knee surgery.  The other issues that she has can certainly wait until after she has her knee surgery.  I want to try to optimize her anemia if we can.  Hopefully, we will get her iron studies back soon so we can give her dose of iron before her knee surgery.  Her knee surgery  is going to be next Tuesday or Wednesday.  I spent a good hour or so with [name] and her daughter.  The are both very nice.  I suspect that [name] has a less than 50% chance of her breast cancer coming back.    ______________________________ Josph Macho,  M.D. PRE/MEDQ  D:  05/25/2011  T:  05/25/2011  Job:  [id number]

## 2011-05-26 ENCOUNTER — Telehealth: Payer: Self-pay | Admitting: Family

## 2011-05-26 DIAGNOSIS — D518 Other vitamin B12 deficiency anemias: Secondary | ICD-10-CM | POA: Diagnosis not present

## 2011-05-26 DIAGNOSIS — E559 Vitamin D deficiency, unspecified: Secondary | ICD-10-CM | POA: Diagnosis not present

## 2011-05-26 LAB — COMPREHENSIVE METABOLIC PANEL
Albumin: 3.9 g/dL (ref 3.5–5.2)
BUN: 24 mg/dL — ABNORMAL HIGH (ref 6–23)
CO2: 24 mEq/L (ref 19–32)
Calcium: 8.6 mg/dL (ref 8.4–10.5)
Chloride: 108 mEq/L (ref 96–112)
Glucose, Bld: 90 mg/dL (ref 70–99)
Potassium: 5 mEq/L (ref 3.5–5.3)

## 2011-05-26 LAB — VITAMIN B12: Vitamin B-12: 285 pg/mL (ref 211–911)

## 2011-05-26 LAB — RETICULOCYTES (CHCC): Retic Ct Pct: 2.2 % (ref 0.4–2.3)

## 2011-05-26 LAB — FERRITIN: Ferritin: 17 ng/mL (ref 10–291)

## 2011-05-26 LAB — VITAMIN D 25 HYDROXY (VIT D DEFICIENCY, FRACTURES): Vit D, 25-Hydroxy: 57 ng/mL (ref 30–89)

## 2011-05-26 LAB — IRON AND TIBC: Iron: 27 ug/dL — ABNORMAL LOW (ref 42–145)

## 2011-05-26 LAB — URINE CULTURE: Colony Count: 2000

## 2011-05-26 MED ORDER — CIPROFLOXACIN HCL 500 MG PO TABS: 500.0000 mg | ORAL_TABLET | Freq: Two times a day (BID) | ORAL | Status: DC

## 2011-05-26 NOTE — Telephone Encounter (Signed)
Reviewed labs from Dr. Gustavo Lah office- Hbg dropped and LFT's up.  Spoke with pt and daughter. She will come to the office Monday AM for follow up visit and stat labs.  If hemoglobin remains stable- will plan to clear for surgery on Tuesday. She denies abdominal pain or dark stools.

## 2011-05-26 NOTE — Pre-Procedure Instructions (Signed)
Pt saw oncologist Dr Raynelle Chary for office visit on 05/25/11.  Note in EPIC.

## 2011-05-29 ENCOUNTER — Ambulatory Visit (INDEPENDENT_AMBULATORY_CARE_PROVIDER_SITE_OTHER): Payer: Medicare Other | Admitting: Family

## 2011-05-29 ENCOUNTER — Encounter: Payer: Self-pay | Admitting: Family

## 2011-05-29 DIAGNOSIS — G4730 Sleep apnea, unspecified: Secondary | ICD-10-CM | POA: Diagnosis not present

## 2011-05-29 DIAGNOSIS — R7989 Other specified abnormal findings of blood chemistry: Secondary | ICD-10-CM

## 2011-05-29 DIAGNOSIS — D649 Anemia, unspecified: Secondary | ICD-10-CM

## 2011-05-29 LAB — CBC WITH DIFFERENTIAL/PLATELET
Basophils Relative: 1 % (ref 0–1)
HCT: 34.1 % — ABNORMAL LOW (ref 36.0–46.0)
Hemoglobin: 10.8 g/dL — ABNORMAL LOW (ref 12.0–15.0)
Lymphocytes Relative: 25 % (ref 12–46)
MCHC: 31.7 g/dL (ref 30.0–36.0)
Monocytes Absolute: 0.5 10*3/uL (ref 0.1–1.0)
Monocytes Relative: 6 % (ref 3–12)
Neutro Abs: 5.3 10*3/uL (ref 1.7–7.7)

## 2011-05-29 LAB — HEPATIC FUNCTION PANEL
Albumin: 3.9 g/dL (ref 3.5–5.2)
Alkaline Phosphatase: 105 U/L (ref 39–117)
Bilirubin, Direct: 0.1 mg/dL (ref 0.0–0.3)
Total Bilirubin: 0.4 mg/dL (ref 0.3–1.2)

## 2011-05-29 NOTE — Patient Instructions (Signed)
We will contact you with the results of your blood work.

## 2011-05-29 NOTE — Assessment & Plan Note (Signed)
Pt has been cleared for surgery, however I did recommend that she be observed in step down post operatively due to her hx of OSA.

## 2011-05-29 NOTE — Progress Notes (Signed)
Subjective:    Patient ID: Kerri Vasquez, female    DOB: 10-26-1955, 56 y.o.   MRN: [id number]  HPI  Kerri Vasquez is a 56 yr old female here today to complete her surgical clearance for her knee replacement which is scheduled for tomorrow.    Anemia-She was noted to have anemia last week, with a HgB which was lower than checked the week prior.  11.6 dropped to 10.8.  She denies dark or bloody stools.  She denies shortness of breath or chest pain.  She does report intermittent fatigue.    Elevated LFT's-  Noted to have ALT of 119 which was higher than her baseline. She notes occasional nausea. She is status post cholecystectomy.   Review of Systems See HPI  Past Medical History  Diagnosis Date  . Cancer 06/01/06    stage II breast cancer  . Depression   . Hypertension   . Migraine   . Hypothyroidism   . Incisional hernia   . Spinal stenosis   . Scoliosis   . Sciatica   . Intestinal obstruction   . Renal insufficiency   . Diabetes mellitus     type II, controlled by diet 05/25/11   . Sleep apnea     hx of off machine x 4 years   . Anemia   . GERD (gastroesophageal reflux disease)   . Septic arthritis 01/28/10    and spinal stenosis , moderate scoliosis   . Osteoarthritis of multiple joints     History   Social History  . Marital Status: Divorced    Spouse Name: N/A    Number of Children: 1  . Years of Education: N/A   Occupational History  . Not on file.   Social History Main Topics  . Smoking status: Never Smoker   . Smokeless tobacco: Never Used  . Alcohol Use: No  . Drug Use: No  . Sexually Active: Not on file   Other Topics Concern  . Not on file   Social History Narrative   Caffeine use: 3 cups coffee/tea dailyRegular exercise: no    Past Surgical History  Procedure Date  . Cholecystectomy 06/29/85  . Tonsillectomy 1969  . Breast surgery 2008     bilateral mastectomy  . Cystectomy 2009    left breast  . Breast reconstruction 2008, 2009  .  Bariatric surgery 2006  . Other surgical history     surgery for intestinal blockage 2011     Family History  Problem Relation Age of Onset  . Cancer Mother     breast  . Hypertension Mother   . Anesthesia problems Daughter     Allergies  Allergen Reactions  . Influenza Vac Split (Flu Virus Vaccine) Other (See Comments)    Joint stiffness and renal failure  . Zostavax (Zoster Vaccine Live ([employer])) Other (See Comments)    Joint stiffness, renal failure  . Adhesive (Tape) Itching and Rash    Current Outpatient Prescriptions on File Prior to Visit  Medication Sig Dispense Refill  . buPROPion (WELLBUTRIN XL) 150 MG 24 hr tablet Take 150 mg by mouth 2 (two) times daily.      . Butalbital-APAP-Caffeine (FIORICET PO) Take 2 tablets by mouth every 8 (eight) hours as needed. migraines      . carvedilol (COREG) 12.5 MG tablet Take 1 tablet (12.5 mg total) by mouth 2 (two) times daily.  60 tablet  2  . docusate sodium (DULCOLAX) 100 MG capsule Take 100 mg  by mouth daily.       Marland Kitchen FLUoxetine (PROZAC) 20 MG capsule Take 20 mg by mouth 2 (two) times daily.       . folic acid (FOLVITE) 1 MG tablet Take 2 mg by mouth daily.       Marland Kitchen gabapentin (NEURONTIN) 300 MG capsule Take 900 mg by mouth at bedtime.      Marland Kitchen letrozole (FEMARA) 2.5 MG tablet Take 2.5 mg by mouth daily.       Marland Kitchen levothyroxine (SYNTHROID, LEVOTHROID) 200 MCG tablet Take 200 mcg by mouth daily before breakfast.       . lidocaine (XYLOCAINE) 5 % ointment Apply 1 application topically 2 (two) times daily.       . magnesium oxide (MAG-OX) 400 MG tablet Take 400 mg by mouth daily.       . pantoprazole (PROTONIX) 40 MG tablet Take 1 tablet (40 mg total) by mouth daily.  90 tablet  0  . PredniSONE 5 MG TBEC Take 5 mg by mouth daily.       . SUMAtriptan (IMITREX) 50 MG tablet Take 50 mg by mouth every 2 (two) hours as needed. One tablet by mouth at start of migraine. May repeat in 2 hours once in 24 hrs as needed      . tiZANidine  (ZANAFLEX) 4 MG tablet Take 1 tablet (4 mg total) by mouth 3 (three) times daily.  90 tablet  0  . venlafaxine (EFFEXOR-XR) 37.5 MG 24 hr capsule Take 37.5 mg by mouth at bedtime.      . Buprenorphine (BUTRANS) 10 MCG/HR PTWK Place 1 patch onto the skin every Tuesday. Apply and change every 7 days      . diclofenac (VOLTAREN) 75 MG EC tablet Take 75 mg by mouth daily.       Marland Kitchen METHOTREXATE SODIUM, PF, IJ Inject 25 mg as directed every Wednesday. Inject 1mL subcutaneously once a week.        BP 108/80  Pulse 78  Temp(Src) 97.8 F (36.6 C) (Oral)  Resp 16  Wt 283 lb (128.368 kg)       Objective:   Physical Exam  Constitutional: She appears well-developed and well-nourished. No distress.  Cardiovascular: Normal rate and regular rhythm.   No murmur heard. Pulmonary/Chest: Effort normal and breath sounds normal.  Genitourinary:       Rectal exam is normal.  No stool in rectal vault obtained for guaic.          Assessment & Plan:

## 2011-05-29 NOTE — Assessment & Plan Note (Signed)
Hemoglobin is stable.   

## 2011-05-29 NOTE — Assessment & Plan Note (Signed)
Suspect fatty liver.  Can complete her work up post operatively.

## 2011-05-30 ENCOUNTER — Encounter: Payer: Self-pay | Admitting: Hematology & Oncology

## 2011-05-30 ENCOUNTER — Encounter (HOSPITAL_COMMUNITY): Payer: Self-pay | Admitting: *Deleted

## 2011-05-30 ENCOUNTER — Telehealth: Payer: Self-pay | Admitting: Hematology & Oncology

## 2011-05-30 ENCOUNTER — Ambulatory Visit (HOSPITAL_COMMUNITY)
Admission: RE | Admit: 2011-05-30 | Discharge: 2011-05-30 | Disposition: A | Discharge status: Home / Self Care | Payer: Medicare Other | Source: Ambulatory Visit | Attending: Orthopedic Surgery | Admitting: Orthopedic Surgery

## 2011-05-30 ENCOUNTER — Encounter (HOSPITAL_COMMUNITY)
Admission: RE | Disposition: A | Discharge status: Home / Self Care | Payer: Self-pay | Source: Ambulatory Visit | Attending: Orthopedic Surgery

## 2011-05-30 DIAGNOSIS — M1710 Unilateral primary osteoarthritis, unspecified knee: Secondary | ICD-10-CM | POA: Diagnosis not present

## 2011-05-30 DIAGNOSIS — Z79899 Other long term (current) drug therapy: Secondary | ICD-10-CM | POA: Diagnosis not present

## 2011-05-30 DIAGNOSIS — D510 Vitamin B12 deficiency anemia due to intrinsic factor deficiency: Secondary | ICD-10-CM

## 2011-05-30 DIAGNOSIS — Z5309 Procedure and treatment not carried out because of other contraindication: Secondary | ICD-10-CM | POA: Insufficient documentation

## 2011-05-30 HISTORY — DX: Vitamin B12 deficiency anemia due to intrinsic factor deficiency: D51.0

## 2011-05-30 LAB — TYPE AND SCREEN: Antibody Screen: NEGATIVE

## 2011-05-30 SURGERY — ARTHROPLASTY, KNEE, TOTAL
Anesthesia: Spinal | Site: Knee | Laterality: Right

## 2011-05-30 MED ORDER — BUPIVACAINE-EPINEPHRINE PF 0.25-1:200000 % IJ SOLN
INTRAMUSCULAR | Status: AC
  Filled 2011-05-30: qty 60

## 2011-05-30 MED ORDER — CARVEDILOL 12.5 MG PO TABS
12.5000 mg | ORAL_TABLET | Freq: Once | ORAL | Status: AC
  Administered 2011-05-30: 12.5 mg via ORAL
  Filled 2011-05-30: qty 1

## 2011-05-30 MED ORDER — LACTATED RINGERS IV SOLN: INTRAVENOUS | Status: DC

## 2011-05-30 MED ORDER — CHLORHEXIDINE GLUCONATE 4 % EX LIQD: 60.0000 mL | Freq: Once | CUTANEOUS | Status: DC

## 2011-05-30 MED ORDER — CEFAZOLIN SODIUM-DEXTROSE 2-3 GM-% IV SOLR: 2.0000 g | Freq: Once | INTRAVENOUS | Status: DC

## 2011-05-30 MED ORDER — KETOROLAC TROMETHAMINE 30 MG/ML IJ SOLN
INTRAMUSCULAR | Status: AC
  Filled 2011-05-30: qty 1

## 2011-05-30 SURGICAL SUPPLY — 54 items
BAG ZIPLOCK 12X15 (MISCELLANEOUS) IMPLANT
BANDAGE ELASTIC 6 VELCRO ST LF (GAUZE/BANDAGES/DRESSINGS) IMPLANT
BANDAGE ESMARK 6X9 LF (GAUZE/BANDAGES/DRESSINGS) IMPLANT
BLADE SAW SGTL 13.0X1.19X90.0M (BLADE) IMPLANT
BNDG ESMARK 6X9 LF (GAUZE/BANDAGES/DRESSINGS)
BOWL SMART MIX CTS (DISPOSABLE) IMPLANT
CLOTH BEACON ORANGE TIMEOUT ST (SAFETY) IMPLANT
CUFF TOURN SGL QUICK 34 (TOURNIQUET CUFF)
CUFF TRNQT CYL 34X4X40X1 (TOURNIQUET CUFF) IMPLANT
DECANTER SPIKE VIAL GLASS SM (MISCELLANEOUS) IMPLANT
DERMABOND ADVANCED (GAUZE/BANDAGES/DRESSINGS)
DERMABOND ADVANCED .7 DNX12 (GAUZE/BANDAGES/DRESSINGS) IMPLANT
DRAPE EXTREMITY T 121X128X90 (DRAPE) IMPLANT
DRAPE POUCH INSTRU U-SHP 10X18 (DRAPES) IMPLANT
DRAPE U-SHAPE 47X51 STRL (DRAPES) IMPLANT
DRSG AQUACEL AG ADV 3.5X10 (GAUZE/BANDAGES/DRESSINGS) IMPLANT
DRSG TEGADERM 4X4.75 (GAUZE/BANDAGES/DRESSINGS) IMPLANT
DURAPREP 26ML APPLICATOR (WOUND CARE) IMPLANT
ELECT REM PT RETURN 9FT ADLT (ELECTROSURGICAL)
ELECTRODE REM PT RTRN 9FT ADLT (ELECTROSURGICAL) IMPLANT
EVACUATOR 1/8 PVC DRAIN (DRAIN) IMPLANT
FACESHIELD LNG OPTICON STERILE (SAFETY) IMPLANT
GAUZE SPONGE 2X2 8PLY STRL LF (GAUZE/BANDAGES/DRESSINGS) IMPLANT
GLOVE BIOGEL PI IND STRL 7.5 (GLOVE) IMPLANT
GLOVE BIOGEL PI IND STRL 8 (GLOVE) IMPLANT
GLOVE BIOGEL PI INDICATOR 7.5 (GLOVE)
GLOVE BIOGEL PI INDICATOR 8 (GLOVE)
GLOVE ECLIPSE 8.0 STRL XLNG CF (GLOVE) IMPLANT
GLOVE ORTHO TXT STRL SZ7.5 (GLOVE) IMPLANT
GOWN BRE IMP PREV XXLGXLNG (GOWN DISPOSABLE) IMPLANT
GOWN STRL NON-REIN LRG LVL3 (GOWN DISPOSABLE) IMPLANT
HANDPIECE INTERPULSE COAX TIP (DISPOSABLE)
IMMOBILIZER KNEE 20 (SOFTGOODS)
IMMOBILIZER KNEE 20 THIGH 36 (SOFTGOODS) IMPLANT
KIT BASIN OR (CUSTOM PROCEDURE TRAY) IMPLANT
MANIFOLD NEPTUNE II (INSTRUMENTS) IMPLANT
NDL SAFETY ECLIPSE 18X1.5 (NEEDLE) IMPLANT
NEEDLE HYPO 18GX1.5 SHARP (NEEDLE)
NS IRRIG 1000ML POUR BTL (IV SOLUTION) IMPLANT
PACK TOTAL JOINT (CUSTOM PROCEDURE TRAY) IMPLANT
POSITIONER SURGICAL ARM (MISCELLANEOUS) IMPLANT
SET HNDPC FAN SPRY TIP SCT (DISPOSABLE) IMPLANT
SET PAD KNEE POSITIONER (MISCELLANEOUS) IMPLANT
SPONGE GAUZE 2X2 STER 10/PKG (GAUZE/BANDAGES/DRESSINGS)
SUCTION FRAZIER 12FR DISP (SUCTIONS) IMPLANT
SUT MNCRL AB 4-0 PS2 18 (SUTURE) IMPLANT
SUT VIC AB 1 CT1 36 (SUTURE) IMPLANT
SUT VIC AB 2-0 CT1 27 (SUTURE)
SUT VIC AB 2-0 CT1 TAPERPNT 27 (SUTURE) IMPLANT
SYR 50ML LL SCALE MARK (SYRINGE) IMPLANT
TOWEL OR 17X26 10 PK STRL BLUE (TOWEL DISPOSABLE) IMPLANT
TRAY FOLEY CATH 14FRSI W/METER (CATHETERS) IMPLANT
WATER STERILE IRR 1500ML POUR (IV SOLUTION) IMPLANT
WRAP KNEE MAXI GEL POST OP (GAUZE/BANDAGES/DRESSINGS) IMPLANT

## 2011-05-30 NOTE — Preoperative (Signed)
Beta Blockers   Reason not to administer Beta Blockers:Coreg taken in preop today

## 2011-05-30 NOTE — Progress Notes (Signed)
Pt received via stretcher. Surgery canceled . See previous notes.

## 2011-05-30 NOTE — Telephone Encounter (Signed)
I left a message that the iron is low and that the B12 is borderline low.  This is due to h/o of gastric by-pass.  She will need IV Iron and likely will need monthly B12.  She is in for knee surgery right now. I told her to call back and let us know what she wants done.  Kerri Vasquez

## 2011-05-30 NOTE — Progress Notes (Signed)
Pt. Reports that she has two teeth on upper right side of her mouth that are decayed and chipped recently.  Shown to Dr. Okey Dupre and Dr. Nilsa Nutting physician assistant Lanney Gins.  Afterexamining pt., decision made to cancel surgery today.  Pt. To be set up for appointment with Dr. Kristin Bruins, dentist.  Surgery to be rescheduled after teeth removal and clearance from medical doctor.  Pt. transferred back to short stay Rm. #5 to dress for discharge.

## 2011-05-30 NOTE — Progress Notes (Signed)
Call to [facility] in OR to give Dr Charlann Boxer a message to call in pain Rx for pt to have at home.

## 2011-05-30 NOTE — Progress Notes (Signed)
Pt reports that she has two teeth on upper right side of her mouth that are decayed and chipped recently.  On exam she indeed has 2-3 teeth in her mouth that are chipped and browning. She states that this is a recent occurrence and that she started to have increasing pain over the last couple of days.  The decision was made to cancel surgery today. Pt. To be set up for appointment with Dr. Kristin Bruins, dentist. Pt will return to the clinic for further evaluation and discussion regarding the potential for rescheduling the surgery after teeth removal.  Anastasio Auerbach. Nilda Keathley   PAC  05/30/2011, 1:30 PM

## 2011-05-30 NOTE — Discharge Summary (Signed)
Surgery Cancelled   Pt was not admitted.  Kerri Vasquez Bernarda Erck   PAC  05/30/2011, 3:52 PM

## 2011-05-31 ENCOUNTER — Telehealth: Payer: Self-pay | Admitting: Hematology & Oncology

## 2011-05-31 MED FILL — Acetaminophen IV Soln 10 MG/ML: INTRAVENOUS | Qty: 100 | Status: AC

## 2011-05-31 MED FILL — Cefazolin Sodium For Inj 1 GM: INTRAMUSCULAR | Qty: 2 | Status: AC

## 2011-05-31 NOTE — Telephone Encounter (Signed)
Pt aware of 06-06-11 Iron and B12 inj. Ari aware to pre cert

## 2011-05-31 NOTE — Telephone Encounter (Signed)
Mailed 3-5 appointment schedule to pt

## 2011-06-05 ENCOUNTER — Encounter (HOSPITAL_COMMUNITY): Payer: Self-pay | Admitting: Dentistry

## 2011-06-05 ENCOUNTER — Ambulatory Visit (HOSPITAL_COMMUNITY): Payer: Medicare Other | Admitting: Dentistry

## 2011-06-05 VITALS — BP 134/90 | HR 63 | Temp 97.1°F

## 2011-06-05 DIAGNOSIS — K0889 Other specified disorders of teeth and supporting structures: Secondary | ICD-10-CM

## 2011-06-05 DIAGNOSIS — K08109 Complete loss of teeth, unspecified cause, unspecified class: Secondary | ICD-10-CM

## 2011-06-05 DIAGNOSIS — K045 Chronic apical periodontitis: Secondary | ICD-10-CM

## 2011-06-05 DIAGNOSIS — E119 Type 2 diabetes mellitus without complications: Secondary | ICD-10-CM

## 2011-06-05 DIAGNOSIS — M264 Malocclusion, unspecified: Secondary | ICD-10-CM

## 2011-06-05 DIAGNOSIS — Z01818 Encounter for other preprocedural examination: Secondary | ICD-10-CM

## 2011-06-05 DIAGNOSIS — M259 Joint disorder, unspecified: Secondary | ICD-10-CM | POA: Diagnosis not present

## 2011-06-05 DIAGNOSIS — K0401 Reversible pulpitis: Secondary | ICD-10-CM

## 2011-06-05 DIAGNOSIS — K036 Deposits [accretions] on teeth: Secondary | ICD-10-CM

## 2011-06-05 NOTE — H&P (Signed)
See Dr. Nilsa Nutting note of 05/21/11 to use as H and P for dental OR procedures. Dr. Cindra Eves

## 2011-06-05 NOTE — Progress Notes (Signed)
DENTAL CONSULTATION  Date of Consultation:  06/05/2011 Patient Name:   Kerri Vasquez Date of Birth:   1955/11/28 Medical Record Number: [id number]  VITALS: BP 134/90  Pulse 63  Temp 97.1 F (36.2 C)   CHIEF COMPLAINT: Patient referred for evaluation of dentition prior to anticipated total knee replacement.  HPI: Kerri Vasquez is a 56 year old female referred by Dr. Lajoyce Corners for a pre-total knee replacement dental protocol. Patient with a history of acute pulpitis symptoms coming from the upper right quadrant. Patient indicates that the pain has been 10 out of 10 in intensity and is currently 6/10 in intensity today. Patient indicates that this pain is been occurring over the past one to 2 years but more recently over the past week. Patient describes the pain as being dull and achy at times and sharp at times. Pain lasts continuously and also wakes the patient up in her sleep. She has been using ibuprofen around the clock to relieve the pain.  Patient last saw a dentist 10-15 years ago to have several wisdom teeth extracted. Patient denies having any complications from those dental extractions. This was in [place]. Patient has no regular primary dentist. Patient indicates that she is a dental phobic. Patient has no partial dentures.   PMH: Past Medical History  Diagnosis Date  . Cancer 06/01/06    stage II breast cancer  . Depression   . Hypertension   . Migraine   . Hypothyroidism   . Incisional hernia   . Spinal stenosis   . Scoliosis   . Sciatica   . Intestinal obstruction   . Renal insufficiency   . Diabetes mellitus     type II, controlled by diet 05/25/11   . Sleep apnea     hx of off machine x 4 years   . Anemia   . GERD (gastroesophageal reflux disease)   . Septic arthritis 01/28/10    and spinal stenosis , moderate scoliosis   . Osteoarthritis of multiple joints   . Pernicious anemia 05/30/2011    PSH: Past Surgical History  Procedure Date  .  Cholecystectomy 06/29/85  . Tonsillectomy 1969  . Breast surgery 2008     bilateral mastectomy  . Cystectomy 2009    left breast  . Breast reconstruction 2008, 2009  . Bariatric surgery 2006  . Other surgical history     surgery for intestinal blockage 2011     ALLERGIES: Allergies  Allergen Reactions  . Influenza Vac Split (Flu Virus Vaccine) Other (See Comments)    Joint stiffness and renal failure  . Zostavax (Zoster Vaccine Live ([employer])) Other (See Comments)    Joint stiffness, renal failure  . Adhesive (Tape) Itching and Rash    MEDICATIONS: Current Outpatient Prescriptions  Medication Sig Dispense Refill  . buPROPion (WELLBUTRIN XL) 150 MG 24 hr tablet Take 150 mg by mouth 2 (two) times daily.      . carvedilol (COREG) 12.5 MG tablet Take 1 tablet (12.5 mg total) by mouth 2 (two) times daily.  60 tablet  2  . diclofenac (VOLTAREN) 75 MG EC tablet Take 75 mg by mouth daily.       Marland Kitchen docusate sodium (DULCOLAX) 100 MG capsule Take 100 mg by mouth 2 (two) times daily.       Marland Kitchen FLUoxetine (PROZAC) 20 MG capsule Take 20 mg by mouth 2 (two) times daily.       . folic acid (FOLVITE) 1 MG tablet Take 2  mg by mouth daily.       Marland Kitchen gabapentin (NEURONTIN) 300 MG capsule Take 900 mg by mouth at bedtime.      Marland Kitchen letrozole (FEMARA) 2.5 MG tablet Take 2.5 mg by mouth daily.       Marland Kitchen levothyroxine (SYNTHROID, LEVOTHROID) 200 MCG tablet Take 200 mcg by mouth daily before breakfast.       . lidocaine (XYLOCAINE) 5 % ointment Apply 1 application topically 2 (two) times daily.       . magnesium oxide (MAG-OX) 400 MG tablet Take 400 mg by mouth daily.       Marland Kitchen METHOTREXATE SODIUM, PF, IJ Inject 25 mg as directed every Wednesday. Inject 1mL subcutaneously once a week.      . pantoprazole (PROTONIX) 40 MG tablet Take 1 tablet (40 mg total) by mouth daily.  90 tablet  0  . PredniSONE 5 MG TBEC Take 5 mg by mouth daily.       Marland Kitchen tiZANidine (ZANAFLEX) 4 MG tablet Take 1 tablet (4 mg total) by mouth 3  (three) times daily.  90 tablet  0  . venlafaxine (EFFEXOR-XR) 37.5 MG 24 hr capsule Take 37.5 mg by mouth at bedtime.      . Buprenorphine (BUTRANS) 10 MCG/HR PTWK Place 1 patch onto the skin every Tuesday. Apply and change every 7 days      . Butalbital-APAP-Caffeine (FIORICET PO) Take 2 tablets by mouth every 8 (eight) hours as needed. migraines      . SUMAtriptan (IMITREX) 50 MG tablet Take 50 mg by mouth every 2 (two) hours as needed. One tablet by mouth at start of migraine. May repeat in 2 hours once in 24 hrs as needed        LABS: Lab Results  Component Value Date   WBC 8.1 05/29/2011   HGB 10.8* 05/29/2011   HCT 34.1* 05/29/2011   MCV 97.2 05/29/2011   PLT 289 05/29/2011      Component Value Date/Time   NA 140 05/25/2011 1528   K 5.0 05/25/2011 1528   CL 108 05/25/2011 1528   CO2 24 05/25/2011 1528   GLUCOSE 90 05/25/2011 1528   BUN 24* 05/25/2011 1528   CREATININE 0.99 05/25/2011 1528   CREATININE 0.98 05/16/2011 0847   CALCIUM 8.6 05/25/2011 1528   GFRNONAA 58* 05/25/2011 1040   GFRAA 67* 05/25/2011 1040   Lab Results  Component Value Date   INR 1.02 05/25/2011   INR 1.01 05/16/2011   INR 1.30 02/05/2010     SOCIAL HISTORY: History   Social History  . Marital Status: Divorced    Spouse Name: N/A    Number of Children: 1  . Years of Education: N/A   Occupational History  . Not on file.   Social History Main Topics  . Smoking status: Never Smoker   . Smokeless tobacco: Never Used  . Alcohol Use: No  . Drug Use: No  . Sexually Active: Not on file   Other Topics Concern  . Not on file   Social History Narrative   Caffeine use: 3 cups coffee/tea dailyRegular exercise: no    FAMILY HISTORY: Family History  Problem Relation Age of Onset  . Cancer Mother     breast  . Hypertension Mother   . Anesthesia problems Daughter      REVIEW OF SYSTEMS: Reviewed from Dr. Nilsa Nutting note of 05/21/11.  DENTAL HISTORY: CHIEF COMPLAINT: Patient referred for evaluation of  dentition prior to anticipated total knee replacement.  HPI: Kerri Vasquez is a 56 year old female referred by Dr. Lajoyce Corners for a pre-total knee replacement dental protocol. Patient with a history of acute pulpitis symptoms coming from the upper right quadrant. Patient indicates that the pain has been 10 out of 10 in intensity and is currently 6/10 in intensity today. Patient indicates that this pain is been occurring over the past one to 2 years but more recently over the past week. Patient describes the pain as being dull and achy at times and sharp at times. Pain lasts continuously and also wakes the patient up in her sleep. She has been using ibuprofen around the clock to relieve the pain.  Patient last saw a dentist 10-15 years ago to have several wisdom teeth extracted. Patient denies having any complications from those dental extractions. This was in [place]. Patient has no regular primary dentist. Patient indicates that she is a dental phobic. Patient has no partial dentures.  DENTAL EXAMINATION:  GENERAL: Patient is a well-developed, well-nourished female in no acute distress. HEAD AND NECK: There is no palpable submandibular lymphadenopathy. The patient denies acute TMJ symptoms although she does have left TMJ crepitus. INTRAORAL EXAM: There is no evidence of abscess formation. Patient has normal saliva. DENTITION: Patient is missing tooth numbers 1, 2, 13, 16, 17, 20, 30, 31, and 32. PERIODONTAL: Patient has chronic advanced periodontal disease with plaque and calculus accumulations, selective areas of gingival recession and selective areas of tooth mobility. DENTAL CARIES/SUBOPTIMAL RESTORATIONS: There are multiple dental caries noted as per dental charting form. ENDODONTIC: Patient currently is experiencing acute pulpitis symptoms. Patient has apical periodontitis affecting tooth numbers 3 and 7. CROWN AND BRIDGE: There are no crown or bridge restorations. PROSTHODONTIC: Patient  denies presents a partial dentures. OCCLUSION: Patient with a poor occlusal scheme secondary to multiple missing teeth, supra-eruption and drifting of the unopposed teeth into the edentulous areas, and lack of replacement of the missing teeth with dental prostheses.  RADIOGRAPHIC INTERPRETATION: A panoramic x-ray was obtained along with 13 periapical radiographs and 3 bitewings. There are multiple missing teeth. There are extensive dental caries affecting multiple teeth. There is periapical radiolucency at the apices of tooth numbers 3 and 7. There is supra-eruption and drifting of the unopposed teeth into the edentulous areas. There are multiple rotated teeth. There is radiographic calculus noted.  ASSESSMENTS: 1. Acute pulpitis 2. Apical periodontitis 3. Multiple dental caries 4. Chronic periodontitis 5. Accretions 6. Multiple missing teeth 7. Supra-eruption and drifting of the unopposed teeth into the edentulous areas 8. Poor occlusal scheme 9. Multiple rotated teeth 10. History of oral neglect 11. Dental phobia  PLAN/RECOMMENDATIONS: 1. I discussed the risks, benefits, and complications of various treatment options with the patient in relationship to the medical and dental conditions. We discussed various treatment options to include no treatment, multiple extractions with alveoloplasty, pre-prosthetic surgery as indicated, periodontal therapy, dental restorations, root canal therapy, crown and bridge therapy, implant therapy, and replacement of missing teeth as indicated. The patient currently wishes to proceed with extraction of multiple teeth with alveoloplasty as indicated along with gross debridement of the remaining dentition in the operating room with general anesthesia on 06/08/2011 at 7:30 AM. Patient will then followup with the general dentist of her choice for continued dental restorations and evaluation for replacement of missing teeth as indicated once medically stable from her  anticipated total knee replacement. Patient will require antibiotic premedication prior to anticipated dental procedures after the total knee replacement was performed.  2. Discussion of findings with medical team and coordination of future total knee replacement and other medical and dental care as indicated.  Charlynne Pander, DDS

## 2011-06-06 ENCOUNTER — Ambulatory Visit (HOSPITAL_BASED_OUTPATIENT_CLINIC_OR_DEPARTMENT_OTHER): Payer: Medicare Other

## 2011-06-06 DIAGNOSIS — D649 Anemia, unspecified: Secondary | ICD-10-CM | POA: Diagnosis not present

## 2011-06-06 DIAGNOSIS — D510 Vitamin B12 deficiency anemia due to intrinsic factor deficiency: Secondary | ICD-10-CM

## 2011-06-06 DIAGNOSIS — E538 Deficiency of other specified B group vitamins: Secondary | ICD-10-CM

## 2011-06-06 MED ORDER — SODIUM CHLORIDE 0.9 % IV SOLN
1020.0000 mg | Freq: Once | INTRAVENOUS | Status: AC
  Administered 2011-06-06: 1020 mg via INTRAVENOUS
  Filled 2011-06-06: qty 34

## 2011-06-06 MED ORDER — SODIUM CHLORIDE 0.9 % IV SOLN
Freq: Once | INTRAVENOUS | Status: AC
  Administered 2011-06-06: 250 mL via INTRAVENOUS

## 2011-06-06 MED ORDER — CYANOCOBALAMIN 1000 MCG/ML IJ SOLN
1000.0000 ug | Freq: Once | INTRAMUSCULAR | Status: AC
  Administered 2011-06-06: 1000 ug via INTRAMUSCULAR

## 2011-06-07 ENCOUNTER — Encounter (HOSPITAL_COMMUNITY): Payer: Self-pay | Admitting: *Deleted

## 2011-06-08 ENCOUNTER — Ambulatory Visit (HOSPITAL_COMMUNITY)
Admission: RE | Admit: 2011-06-08 | Discharge: 2011-06-08 | Disposition: A | Discharge status: Home / Self Care | Payer: Medicare Other | Source: Ambulatory Visit | Attending: Dentistry | Admitting: Dentistry

## 2011-06-08 ENCOUNTER — Encounter (HOSPITAL_COMMUNITY): Payer: Self-pay | Admitting: *Deleted

## 2011-06-08 ENCOUNTER — Ambulatory Visit (HOSPITAL_COMMUNITY): Payer: Medicare Other | Admitting: *Deleted

## 2011-06-08 ENCOUNTER — Encounter (HOSPITAL_COMMUNITY)
Admission: RE | Disposition: A | Discharge status: Home / Self Care | Payer: Self-pay | Source: Ambulatory Visit | Attending: Dentistry

## 2011-06-08 DIAGNOSIS — K029 Dental caries, unspecified: Secondary | ICD-10-CM | POA: Diagnosis not present

## 2011-06-08 DIAGNOSIS — E119 Type 2 diabetes mellitus without complications: Secondary | ICD-10-CM | POA: Diagnosis not present

## 2011-06-08 DIAGNOSIS — I10 Essential (primary) hypertension: Secondary | ICD-10-CM | POA: Diagnosis not present

## 2011-06-08 DIAGNOSIS — K036 Deposits [accretions] on teeth: Secondary | ICD-10-CM | POA: Insufficient documentation

## 2011-06-08 DIAGNOSIS — K045 Chronic apical periodontitis: Secondary | ICD-10-CM | POA: Insufficient documentation

## 2011-06-08 DIAGNOSIS — M1710 Unilateral primary osteoarthritis, unspecified knee: Secondary | ICD-10-CM | POA: Diagnosis not present

## 2011-06-08 DIAGNOSIS — K219 Gastro-esophageal reflux disease without esophagitis: Secondary | ICD-10-CM | POA: Diagnosis not present

## 2011-06-08 DIAGNOSIS — K0401 Reversible pulpitis: Secondary | ICD-10-CM

## 2011-06-08 HISTORY — PX: MULTIPLE EXTRACTIONS WITH ALVEOLOPLASTY: SHX5342

## 2011-06-08 LAB — BASIC METABOLIC PANEL
CO2: 27 mEq/L (ref 19–32)
Calcium: 9.4 mg/dL (ref 8.4–10.5)
Chloride: 106 mEq/L (ref 96–112)
Creatinine, Ser: 0.94 mg/dL (ref 0.50–1.10)
GFR calc Af Amer: 77 mL/min — ABNORMAL LOW (ref >90)
Sodium: 141 mEq/L (ref 135–145)

## 2011-06-08 LAB — CBC
Platelets: 271 10*3/uL (ref 150–400)
RBC: 3.53 MIL/uL — ABNORMAL LOW (ref 3.87–5.11)
RDW: 14.2 % (ref 11.5–15.5)
WBC: 7.9 10*3/uL (ref 4.0–10.5)

## 2011-06-08 LAB — GLUCOSE, CAPILLARY: Glucose-Capillary: 83 mg/dL (ref 70–99)

## 2011-06-08 SURGERY — MULTIPLE EXTRACTION WITH ALVEOLOPLASTY
Anesthesia: General | Site: Mouth | Wound class: Clean Contaminated

## 2011-06-08 MED ORDER — PROMETHAZINE HCL 25 MG/ML IJ SOLN: 6.2500 mg | INTRAMUSCULAR | Status: DC | PRN

## 2011-06-08 MED ORDER — FENTANYL CITRATE 0.05 MG/ML IJ SOLN
25.0000 ug | INTRAMUSCULAR | Status: DC | PRN
  Administered 2011-06-08 (×3): 50 ug via INTRAVENOUS

## 2011-06-08 MED ORDER — LIDOCAINE-EPINEPHRINE 2 %-1:100000 IJ SOLN
INTRAMUSCULAR | Status: DC | PRN
  Administered 2011-06-08: 5.1 mL

## 2011-06-08 MED ORDER — LIDOCAINE-EPINEPHRINE 2 %-1:100000 IJ SOLN
INTRAMUSCULAR | Status: AC
  Filled 2011-06-08: qty 5.1

## 2011-06-08 MED ORDER — LACTATED RINGERS IV SOLN: INTRAVENOUS | Status: DC

## 2011-06-08 MED ORDER — FENTANYL CITRATE 0.05 MG/ML IJ SOLN
INTRAMUSCULAR | Status: DC | PRN
  Administered 2011-06-08 (×2): 100 ug via INTRAVENOUS

## 2011-06-08 MED ORDER — ONDANSETRON HCL 4 MG/2ML IJ SOLN
INTRAMUSCULAR | Status: DC | PRN
  Administered 2011-06-08: 4 mg via INTRAVENOUS

## 2011-06-08 MED ORDER — OXYCODONE-ACETAMINOPHEN 10-325 MG PO TABS: 1.0000 | ORAL_TABLET | ORAL | Status: AC | PRN

## 2011-06-08 MED ORDER — ISOPROPYL ALCOHOL 70 % SOLN
Status: DC | PRN
  Administered 2011-06-08: 1 via TOPICAL

## 2011-06-08 MED ORDER — FENTANYL CITRATE 0.05 MG/ML IJ SOLN
INTRAMUSCULAR | Status: AC
  Filled 2011-06-08: qty 2

## 2011-06-08 MED ORDER — SUCCINYLCHOLINE CHLORIDE 20 MG/ML IJ SOLN
INTRAMUSCULAR | Status: DC | PRN
  Administered 2011-06-08: 160 mg via INTRAVENOUS

## 2011-06-08 MED ORDER — BUPIVACAINE-EPINEPHRINE PF 0.5-1:200000 % IJ SOLN
INTRAMUSCULAR | Status: AC
  Filled 2011-06-08: qty 5.4

## 2011-06-08 MED ORDER — GLYCOPYRROLATE 0.2 MG/ML IJ SOLN
INTRAMUSCULAR | Status: DC | PRN
  Administered 2011-06-08: .6 mg via INTRAVENOUS

## 2011-06-08 MED ORDER — LIDOCAINE HCL (CARDIAC) 20 MG/ML IV SOLN
INTRAVENOUS | Status: DC | PRN
  Administered 2011-06-08: 100 mg via INTRAVENOUS

## 2011-06-08 MED ORDER — CEFAZOLIN SODIUM-DEXTROSE 2-3 GM-% IV SOLR
2.0000 g | INTRAVENOUS | Status: AC
  Administered 2011-06-08: 2 g via INTRAVENOUS

## 2011-06-08 MED ORDER — MORPHINE SULFATE 4 MG/ML IJ SOLN: 2.0000 mg | INTRAMUSCULAR | Status: DC | PRN

## 2011-06-08 MED ORDER — LABETALOL HCL 5 MG/ML IV SOLN
INTRAVENOUS | Status: DC | PRN
  Administered 2011-06-08: 10 mg via INTRAVENOUS

## 2011-06-08 MED ORDER — OXYCODONE-ACETAMINOPHEN 5-325 MG PO TABS
ORAL_TABLET | ORAL | Status: AC
  Filled 2011-06-08: qty 1

## 2011-06-08 MED ORDER — NEOSTIGMINE METHYLSULFATE 1 MG/ML IJ SOLN
INTRAMUSCULAR | Status: DC | PRN
  Administered 2011-06-08: 4 mg via INTRAVENOUS

## 2011-06-08 MED ORDER — DEXAMETHASONE SODIUM PHOSPHATE 4 MG/ML IJ SOLN
INTRAMUSCULAR | Status: DC | PRN
  Administered 2011-06-08: 10 mg via INTRAVENOUS

## 2011-06-08 MED ORDER — PROPOFOL 10 MG/ML IV BOLUS
INTRAVENOUS | Status: DC | PRN
  Administered 2011-06-08: 100 mg via INTRAVENOUS
  Administered 2011-06-08: 200 mg via INTRAVENOUS

## 2011-06-08 MED ORDER — BUPIVACAINE-EPINEPHRINE PF 0.5-1:200000 % IJ SOLN
INTRAMUSCULAR | Status: DC | PRN
  Administered 2011-06-08: 1.8 mL

## 2011-06-08 MED ORDER — MIDAZOLAM HCL 5 MG/5ML IJ SOLN
INTRAMUSCULAR | Status: DC | PRN
  Administered 2011-06-08: 2 mg via INTRAVENOUS

## 2011-06-08 MED ORDER — OXYMETAZOLINE HCL 0.05 % NA SOLN
NASAL | Status: AC
  Filled 2011-06-08: qty 15

## 2011-06-08 MED ORDER — OXYCODONE-ACETAMINOPHEN 5-325 MG PO TABS
1.0000 | ORAL_TABLET | ORAL | Status: DC | PRN
  Administered 2011-06-08: 1 via ORAL

## 2011-06-08 MED ORDER — ROCURONIUM BROMIDE 100 MG/10ML IV SOLN
INTRAVENOUS | Status: DC | PRN
  Administered 2011-06-08: 10 mg via INTRAVENOUS
  Administered 2011-06-08: 30 mg via INTRAVENOUS

## 2011-06-08 MED ORDER — LACTATED RINGERS IV SOLN
INTRAVENOUS | Status: DC | PRN
  Administered 2011-06-08: 08:00:00 via INTRAVENOUS

## 2011-06-08 SURGICAL SUPPLY — 22 items
ATTRACTOMAT 16X20 MAGNETIC DRP (DRAPES) ×2 IMPLANT
BAG ZIPLOCK 12X15 (MISCELLANEOUS) IMPLANT
BLADE SURG 15 STRL LF DISP TIS (BLADE) ×2 IMPLANT
BLADE SURG 15 STRL SS (BLADE) ×2
CLOTH BEACON ORANGE TIMEOUT ST (SAFETY) ×2 IMPLANT
GAUZE SPONGE 4X4 16PLY XRAY LF (GAUZE/BANDAGES/DRESSINGS) ×2 IMPLANT
GLOVE SURG ORTHO 8.0 STRL STRW (GLOVE) ×2 IMPLANT
GLOVE SURG SS PI 6.5 STRL IVOR (GLOVE) ×2 IMPLANT
KIT BASIN OR (CUSTOM PROCEDURE TRAY) ×2 IMPLANT
NS IRRIG 1000ML POUR BTL (IV SOLUTION) ×2 IMPLANT
PACK EENT SPLIT (PACKS) ×2 IMPLANT
PACKING VAGINAL (PACKING) IMPLANT
PAD EYE OVAL STERILE LF (GAUZE/BANDAGES/DRESSINGS) IMPLANT
SPONGE GAUZE 4X4 12PLY (GAUZE/BANDAGES/DRESSINGS) ×2 IMPLANT
SUCTION FRAZIER 12FR DISP (SUCTIONS) ×2 IMPLANT
SUT CHROMIC 3 0 PS 2 (SUTURE) ×4 IMPLANT
SUT CHROMIC 4 0 P 3 18 (SUTURE) IMPLANT
SYR 50ML LL SCALE MARK (SYRINGE) ×2 IMPLANT
TUBING CONNECTING 10 (TUBING) ×2 IMPLANT
VESSEL CANN W0 1 W VA 30003 (MISCELLANEOUS) ×2 IMPLANT
WATER STERILE IRR 1500ML POUR (IV SOLUTION) ×2 IMPLANT
YANKAUER SUCT BULB TIP NO VENT (SUCTIONS) ×2 IMPLANT

## 2011-06-08 NOTE — Transfer of Care (Signed)
Immediate Anesthesia Transfer of Care Note  Patient: Kerri Vasquez  Procedure(s) Performed:  MULTIPLE EXTRACION WITH ALVEOLOPLASTY - Extraction of tooth #'s 3,4,7 with alveoloplasty and Gross Debridement of Remaining Teeth  Patient Location: PACU  Anesthesia Type: General  Level of Consciousness: awake, alert  and oriented  Airway & Oxygen Therapy: Patient Spontanous Breathing and Patient connected to face mask oxygen  Post-op Assessment: Report given to PACU RN and Post -op Vital signs reviewed and stable  Post vital signs: Reviewed and stable  Complications: No apparent anesthesia complications

## 2011-06-08 NOTE — Anesthesia Postprocedure Evaluation (Signed)
  Anesthesia Post-op Note  Patient: Kerri Vasquez  Procedure(s) Performed:  MULTIPLE EXTRACION WITH ALVEOLOPLASTY - Extraction of tooth #'s 3,4,7 with alveoloplasty and Gross Debridement of Remaining Teeth  Patient Location: PACU  Anesthesia Type: General  Level of Consciousness: awake and alert   Airway and Oxygen Therapy: Patient Spontanous Breathing  Post-op Pain: mild  Post-op Assessment: Post-op Vital signs reviewed, Patient's Cardiovascular Status Stable, Respiratory Function Stable, Patent Airway and No signs of Nausea or vomiting  Post-op Vital Signs: stable  Complications: No apparent anesthesia complications

## 2011-06-08 NOTE — Anesthesia Preprocedure Evaluation (Signed)
Anesthesia Evaluation  Patient identified by MRN, date of birth, ID band Patient awake    Reviewed: Allergy & Precautions, H&P , NPO status , Patient's Chart, lab work & pertinent test results  Airway Mallampati: II TM Distance: <3 FB Neck ROM: Full    Dental No notable dental hx.    Pulmonary neg pulmonary ROS, sleep apnea ,  clear to auscultation  Pulmonary exam normal       Cardiovascular hypertension, Pt. on medications Regular Normal    Neuro/Psych Negative Neurological ROS  Negative Psych ROS   GI/Hepatic Neg liver ROS, GERD-  ,  Endo/Other  Diabetes mellitus-Hypothyroidism Morbid obesity  Renal/GU negative Renal ROS  Genitourinary negative   Musculoskeletal negative musculoskeletal ROS (+)   Abdominal   Peds negative pediatric ROS (+)  Hematology negative hematology ROS (+)   Anesthesia Other Findings   Reproductive/Obstetrics negative OB ROS                           Anesthesia Physical Anesthesia Plan  ASA: III  Anesthesia Plan: General   Post-op Pain Management:    Induction: Intravenous  Airway Management Planned: Nasal ETT  Additional Equipment:   Intra-op Plan:   Post-operative Plan: Extubation in OR  Informed Consent: I have reviewed the patients History and Physical, chart, labs and discussed the procedure including the risks, benefits and alternatives for the proposed anesthesia with the patient or authorized representative who has indicated his/her understanding and acceptance.   Dental advisory given  Plan Discussed with: CRNA  Anesthesia Plan Comments:         Anesthesia Quick Evaluation

## 2011-06-08 NOTE — Op Note (Signed)
Patient:            Kerri Vasquez Date of Birth:  17-Mar-1956 MRN:                [id number]   DATE OF PROCEDURE:  06/08/2011               OPERATIVE REPORT   PREOPERATIVE DIAGNOSES: 1. End Stage Osteoarthritis  2. Pre-Total Knee Replacement Dental Protocol 3. Chronic apical periodontitis 4. Chronic periodontitis 5. Accretions 6. Dental caries  POSTOPERATIVE DIAGNOSES: 1. End Stage Osteoarthritis  2. Pre-Total Knee Replacement Dental Protocol 3. Chronic apical periodontitis 4. Chronic periodontitis 5. Accretions 6. Dental caries  OPERATIONS: 1. Multiple extraction of tooth numbers 3, 4, 7. 2. 1 Quadrant of alveoloplasty 3. Gross debridement of remaining dentition   SURGEON: Charlynne Pander, DDS  ASSISTANT: Zettie Pho, (dental assistant)  ANESTHESIA: General anesthesia via nasoendotracheal tube.  MEDICATIONS: 1. Ancef 2 g IV prior to invasive dental procedures. 2. Local anesthesia with a total utilization of 3 carpules each containing 34 mg of lidocaine with 0.017 mg of epinephrine as well as 1 carpule each containing 9 mg of bupivacaine with 0.009 mg of epinephrine.  SPECIMENS: There are 3 teeth that were discarded.  DRAINS: None  CULTURES: None  COMPLICATIONS: None   ESTIMATED BLOOD LOSS: 25 mLs.  INTRAVENOUS FLUIDS: 1100 mLs of Lactated ringers solution.  INDICATIONS: The patient was recently diagnosed with end-stage osteoarthritis of the right knee.  A dental consultation was then requested to rule out dental infection that may affect the patient's systemic health and anticipated total knee replacements surgery.  The patient was examined and treatment planned for extraction of multiple teeth with alveoloplasty and gross debridement of the remaining dentition.  This treatment plan was formulated to decrease the risks and complications associated with dental infection from affecting the patient's systemic health and the anticipated total knee  replacement.  OPERATIVE FINDINGS: Patient was examined operating room number 10.  The teeth were identified for extraction. The patient was noted be affected by chronic periodontitis, apical periodontitis, history of acute pulpitis, dental caries, and accretions.   DESCRIPTION OF PROCEDURE: Patient was brought to the main operating room number 10. Patient was then placed in the supine position on the operating table. General Anesthesia was then induced per the anesthesia team. The patient was then prepped and draped in the usual manner for dental medicine procedure. A timeout was performed. The patient was identified and procedures were verified. A throat pack was placed at this time. The oral cavity was then thoroughly examined with the findings noted above. The patient was then ready for dental medicine procedure as follows:  Local anesthesia was then administered sequentially with a total utilization of 3 carpules each containing 34 mg of lidocaine with 0.017 mg of epinephrine as well as 1 carpules  each containing 9 mg bupivacaine with 0.009 mg of epinephrine.  The Maxillary right quadrant was first approached. Anesthesia was then delivered utilizing infiltration with lidocaine with epinephrine. A #15 blade incision was then made from the maxillary right tuberosity and extended to the mesial of #4.  A  surgical flap was then carefully reflected. Appropriate amounts of buccal and interseptal bone were then removed utilizing a surgical handpiece and bur and copious amounts of sterile saline.  The teeth were then subluxated with a series of straight elevators. Tooth numbers 3, 4, and 7 were then removed with a 150 forceps without complications. Alveoloplasty was then performed utilizing a  ronguers and bone file. The surgical site was then irrigated with copious amounts of sterile saline. The tissues were approximated and trimmed appropriately. The surgical site was then closed from the maxillary right  tuberosity and extended to the distal of #5 utilizing 3-0 chromic gut suture in a continuous interrupted suture technique x1. The surgical site area #7 was then approached. The surgical site was then closed from the mesial of #6 and extended to the distal of #8 utilizing 3-0 chromic gut suture in a continuous interrupted suture technique x1.  At this point time, the remaining quadrants were approached. Further anesthesia was provided to remaining quadrants using the bupivacaine and lidocaine with epinephrine. A sonic scaler was used to remove significant accretions. A series of hand curettes were then utilized remove further accretions. The sonic scaler was then again used to refine removal of accretions as indicated. The surgical site was then irrigated with copious of sterile saline. The patient was exam for complications. Seeing no complications, the dental medicine procedure was deemed to be complete. The throat pack was removed at this time. A series of 4 x 4 gauzes were placed in the mouth to aid hemostasis. The patient was then handed over to the anesthesia team for final disposition. After an appropriate amount of time, the patient was extubated and taken to the postanesthesia care unit with stable vital signs and a good condition. All counts were correct for the dental medicine procedure.  The patient is to return to Dental Medicine for suture removal in 7- 10 days. Patient may proceed with knee replacement surgery at the discretion of Dr. Lajoyce Corners. The patient is aware that she has other dental needs and was instructed to follow up with the primary dentist of her choice once medically stable from the knee replacement surgery. The patient will need antibiotic premedication prior to invasive dental procedures after the knee replacement surgery.  Charlynne Pander, DDS.

## 2011-06-08 NOTE — Anesthesia Procedure Notes (Addendum)
Performed by: Enriqueta Shutter DEE   Date/Time: 06/08/2011 7:41 AM Performed by: Enriqueta Shutter DEE Pre-anesthesia Checklist: Patient identified, Emergency Drugs available, Suction available and Patient being monitored Patient Re-evaluated:Patient Re-evaluated prior to inductionOxygen Delivery Method: Circle System Utilized Preoxygenation: Pre-oxygenation with 100% oxygen Intubation Type: IV induction Ventilation: Mask ventilation without difficulty and Nasal airway inserted- appropriate to patient size Laryngoscope Size: Mac and 4 Grade View: Grade II Nasal Tubes: Right, Nasal Rae and Magill forceps - small, utilized Tube size: 7.5 mm Number of attempts: 2 Placement Confirmation: ETT inserted through vocal cords under direct vision,  breath sounds checked- equal and bilateral,  positive ETCO2 and CO2 detector Secured at: 25 cm Tube secured with: Tape Dental Injury: Teeth and Oropharynx as per pre-operative assessment

## 2011-06-20 ENCOUNTER — Ambulatory Visit (HOSPITAL_COMMUNITY): Payer: Medicare Other | Admitting: Dentistry

## 2011-06-28 ENCOUNTER — Telehealth (HOSPITAL_COMMUNITY): Payer: Self-pay | Admitting: Dentistry

## 2011-07-03 ENCOUNTER — Encounter (HOSPITAL_COMMUNITY): Payer: Self-pay | Admitting: Dentistry

## 2011-07-04 ENCOUNTER — Ambulatory Visit: Payer: Medicare Other | Admitting: Hematology & Oncology

## 2011-07-04 ENCOUNTER — Other Ambulatory Visit (HOSPITAL_BASED_OUTPATIENT_CLINIC_OR_DEPARTMENT_OTHER): Payer: Medicare Other | Admitting: Lab

## 2011-07-10 DIAGNOSIS — I972 Postmastectomy lymphedema syndrome: Secondary | ICD-10-CM | POA: Diagnosis not present

## 2011-07-10 DIAGNOSIS — R072 Precordial pain: Secondary | ICD-10-CM | POA: Diagnosis not present

## 2011-07-10 DIAGNOSIS — C50919 Malignant neoplasm of unspecified site of unspecified female breast: Secondary | ICD-10-CM | POA: Diagnosis not present

## 2011-07-10 DIAGNOSIS — E119 Type 2 diabetes mellitus without complications: Secondary | ICD-10-CM | POA: Diagnosis not present

## 2011-07-10 DIAGNOSIS — R069 Unspecified abnormalities of breathing: Secondary | ICD-10-CM | POA: Diagnosis not present

## 2011-07-10 DIAGNOSIS — E538 Deficiency of other specified B group vitamins: Secondary | ICD-10-CM | POA: Diagnosis not present

## 2011-07-10 DIAGNOSIS — D3910 Neoplasm of uncertain behavior of unspecified ovary: Secondary | ICD-10-CM | POA: Diagnosis not present

## 2011-07-10 DIAGNOSIS — I10 Essential (primary) hypertension: Secondary | ICD-10-CM | POA: Diagnosis not present

## 2011-07-19 ENCOUNTER — Telehealth: Payer: Self-pay | Admitting: Hematology & Oncology

## 2011-07-19 NOTE — Telephone Encounter (Signed)
Pt called said wasn't aware of 3-5 no show scheduled 07-31-11 appointment wants B12 inj also

## 2011-07-21 ENCOUNTER — Telehealth: Payer: Self-pay | Admitting: Family

## 2011-07-21 MED ORDER — LEVOTHYROXINE SODIUM 200 MCG PO TABS: 200.0000 ug | ORAL_TABLET | Freq: Every day | ORAL | Status: DC

## 2011-07-21 NOTE — Telephone Encounter (Signed)
Refill- levothyroxine 200mg . Take one tablet at start of migraine. May repeat in 2 hours if needed. Do not repeat more than once per 24 hours. Qty 10 last fill 11.6.12

## 2011-07-21 NOTE — Telephone Encounter (Signed)
Verified with pharmacist that pt is requesting a refill of Levothyroxine, NOT imitrex. Refill sent to pharmacy.

## 2011-07-25 ENCOUNTER — Other Ambulatory Visit: Payer: Self-pay | Admitting: *Deleted

## 2011-07-25 DIAGNOSIS — C50919 Malignant neoplasm of unspecified site of unspecified female breast: Secondary | ICD-10-CM

## 2011-07-25 MED ORDER — LETROZOLE 2.5 MG PO TABS: 2.5000 mg | ORAL_TABLET | Freq: Every day | ORAL | Status: DC

## 2011-07-25 NOTE — Telephone Encounter (Signed)
Received a refill request for Femara 2.5 mg from [facility]. Sent via eprescribe as this is a chronic med.

## 2011-07-31 ENCOUNTER — Ambulatory Visit: Payer: Self-pay | Admitting: Hematology & Oncology

## 2011-07-31 ENCOUNTER — Telehealth: Payer: Self-pay | Admitting: Hematology & Oncology

## 2011-07-31 ENCOUNTER — Ambulatory Visit (HOSPITAL_BASED_OUTPATIENT_CLINIC_OR_DEPARTMENT_OTHER): Payer: Medicare Other | Admitting: Hematology & Oncology

## 2011-07-31 ENCOUNTER — Other Ambulatory Visit: Payer: Medicare Other | Admitting: Lab

## 2011-07-31 ENCOUNTER — Ambulatory Visit (HOSPITAL_BASED_OUTPATIENT_CLINIC_OR_DEPARTMENT_OTHER): Payer: Medicare Other

## 2011-07-31 ENCOUNTER — Other Ambulatory Visit: Payer: Self-pay | Admitting: Lab

## 2011-07-31 VITALS — BP 150/87 | HR 68 | Temp 98.2°F | Ht 66.0 in | Wt 287.0 lb

## 2011-07-31 DIAGNOSIS — E538 Deficiency of other specified B group vitamins: Secondary | ICD-10-CM

## 2011-07-31 DIAGNOSIS — D509 Iron deficiency anemia, unspecified: Secondary | ICD-10-CM | POA: Diagnosis not present

## 2011-07-31 DIAGNOSIS — Z9884 Bariatric surgery status: Secondary | ICD-10-CM

## 2011-07-31 DIAGNOSIS — D510 Vitamin B12 deficiency anemia due to intrinsic factor deficiency: Secondary | ICD-10-CM

## 2011-07-31 DIAGNOSIS — C50519 Malignant neoplasm of lower-outer quadrant of unspecified female breast: Secondary | ICD-10-CM

## 2011-07-31 DIAGNOSIS — C50919 Malignant neoplasm of unspecified site of unspecified female breast: Secondary | ICD-10-CM | POA: Diagnosis not present

## 2011-07-31 DIAGNOSIS — D518 Other vitamin B12 deficiency anemias: Secondary | ICD-10-CM | POA: Diagnosis not present

## 2011-07-31 DIAGNOSIS — M25561 Pain in right knee: Secondary | ICD-10-CM

## 2011-07-31 DIAGNOSIS — D649 Anemia, unspecified: Secondary | ICD-10-CM

## 2011-07-31 DIAGNOSIS — E559 Vitamin D deficiency, unspecified: Secondary | ICD-10-CM | POA: Diagnosis not present

## 2011-07-31 LAB — CBC WITH DIFFERENTIAL (CANCER CENTER ONLY)
BASO#: 0 10*3/uL (ref 0.0–0.2)
EOS%: 2.3 % (ref 0.0–7.0)
Eosinophils Absolute: 0.2 10*3/uL (ref 0.0–0.5)
HCT: 33.9 % — ABNORMAL LOW (ref 34.8–46.6)
HGB: 11.2 g/dL — ABNORMAL LOW (ref 11.6–15.9)
MCH: 32.9 pg (ref 26.0–34.0)
MCHC: 33 g/dL (ref 32.0–36.0)
MONO%: 7.3 % (ref 0.0–13.0)
NEUT%: 66.5 % (ref 39.6–80.0)
RBC: 3.4 10*6/uL — ABNORMAL LOW (ref 3.70–5.32)

## 2011-07-31 MED ORDER — SODIUM CHLORIDE 0.9 % IJ SOLN
10.0000 mL | INTRAMUSCULAR | Status: DC | PRN
  Filled 2011-07-31: qty 10

## 2011-07-31 MED ORDER — SODIUM CHLORIDE 0.9 % IJ SOLN
3.0000 mL | Freq: Once | INTRAMUSCULAR | Status: DC | PRN
  Filled 2011-07-31: qty 10

## 2011-07-31 MED ORDER — CYANOCOBALAMIN 1000 MCG/ML IJ SOLN
1000.0000 ug | Freq: Once | INTRAMUSCULAR | Status: AC
  Administered 2011-07-31: 1000 ug via INTRAMUSCULAR

## 2011-07-31 MED ORDER — EXEMESTANE 25 MG PO TABS: 25.0000 mg | ORAL_TABLET | Freq: Every day | ORAL | Status: AC

## 2011-07-31 MED ORDER — HYDROCODONE-IBUPROFEN 7.5-200 MG PO TABS: 1.0000 | ORAL_TABLET | Freq: Three times a day (TID) | ORAL | Status: AC | PRN

## 2011-07-31 NOTE — Progress Notes (Signed)
Kerri Vasquez still has not had knee surgery. She apparently had a tooth abscess. The tube has been removed. She goes back to the dentist for surgical clearance this week.  When we first saw Kerri Vasquez in more in January, she was markedly iron deficient. Kerri Vasquez ferritin was 17 and Kerri Vasquez iron saturation was 7%. She got a dose of heme back on February 4. A dose of 1020 mg.  She also has B12 deficiency. 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 Kerri Vasquez is on vitamin B12 monthly.  Still bothered by arthralgias from Femara. I will go ahead and stop his up Kerri Vasquez on Aromasin. I also will give Kerri Vasquez some limited pain medication for the arthralgias she is having.  She is on methotrexate for rheumatoid arthritis. She takes this weekly.  Kerri Vasquez physical exam shows an obese woman in mild distress secondary to bony pain. Kerri Vasquez vital signs show a temperature of 98 2 pulse 60 responders 60 blood pressure 150/87 weight is 287 She actuallyl sustain aburn injury on Kerri Vasquez left breast. This was from a heating pad that she put on because of discomfort. She has not had a mammogram in 2 years. She needs to have this done.  Kerri Vasquez lungs are clear. Cardiac exam regular rhythm with no murmurs breast exam on Kerri Vasquez right breast shows a considerable reconstruction signs patient with implant. In the right breast. She did have some tenderness to body and posterior arm. Left breast also shows some reconstruction surgical scars. She has had this eschar in the 5:00 position. When. There is no obvious mass in the left breast is no left axillary adenopathy. Abdominal exam soft his enalapril Zoloft probably scars. There is no appearance of the palpable pedal splenomegaly. Extremities shows lymphedema in the right arm. She does have tenderness in Kerri Vasquez bones. There is tenderness in Kerri Vasquez knees. Neurological exam shows no focal neurological deficits.  Lab studies show white count 6.8 hemoglobin 11.2 hematocrit 3.9 platelet count 225. MCV is 100.  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 This was up in [place]. She did receive chemotherapy. She had bilateral mastectomies. She was diagnosed in 2008.  She had delayed gastric bypass in 2006. As she is B12 deficient. She also is iron deficient. We will check Kerri Vasquez iron stores today.  Hopefully, the Aromasin we'll help Kerri Vasquez.  She needs to come back monthly for Kerri Vasquez B12 injections.  We'll see Kerri Vasquez back in a month so we can see how she's doing with Aromasin.

## 2011-07-31 NOTE — Telephone Encounter (Signed)
Pt aware of 09-04-11 appointment. MD and [facility] aware [facility] will not do diagnostic mamm[facility] on total mastectomy with implants.

## 2011-08-01 ENCOUNTER — Telehealth: Payer: Self-pay | Admitting: Family

## 2011-08-01 ENCOUNTER — Encounter: Payer: Self-pay | Admitting: Family

## 2011-08-01 LAB — COMPREHENSIVE METABOLIC PANEL
AST: 16 U/L (ref 0–37)
Albumin: 3.6 g/dL (ref 3.5–5.2)
Alkaline Phosphatase: 79 U/L (ref 39–117)
BUN: 13 mg/dL (ref 6–23)
Calcium: 9.2 mg/dL (ref 8.4–10.5)
Chloride: 104 mEq/L (ref 96–112)
Creatinine, Ser: 0.97 mg/dL (ref 0.50–1.10)
Glucose, Bld: 99 mg/dL (ref 70–99)

## 2011-08-01 LAB — RETICULOCYTES (CHCC): RBC.: 3.53 MIL/uL — ABNORMAL LOW (ref 3.87–5.11)

## 2011-08-01 LAB — VITAMIN D 25 HYDROXY (VIT D DEFICIENCY, FRACTURES): Vit D, 25-Hydroxy: 39 ng/mL (ref 30–89)

## 2011-08-01 LAB — FERRITIN: Ferritin: 128 ng/mL (ref 10–291)

## 2011-08-01 MED ORDER — GABAPENTIN 300 MG PO CAPS: 900.0000 mg | ORAL_CAPSULE | Freq: Every day | ORAL | Status: DC

## 2011-08-01 NOTE — Telephone Encounter (Signed)
OK to give #90 with zero refills 

## 2011-08-01 NOTE — Telephone Encounter (Signed)
Refill- gabapentin 300mg . Take one capsule by mouth every eight hours as needed. Qty 90 last fill 2.22.13

## 2011-08-01 NOTE — Telephone Encounter (Signed)
Please advise; re- refills

## 2011-08-01 NOTE — Telephone Encounter (Signed)
Refill sent to pharmacy #[id number] x no refills. 

## 2011-08-02 ENCOUNTER — Ambulatory Visit (HOSPITAL_COMMUNITY): Payer: Self-pay | Admitting: Dentistry

## 2011-08-02 ENCOUNTER — Telehealth: Payer: Self-pay | Admitting: *Deleted

## 2011-08-02 ENCOUNTER — Telehealth: Payer: Self-pay | Admitting: Family

## 2011-08-02 ENCOUNTER — Encounter (HOSPITAL_COMMUNITY): Payer: Self-pay | Admitting: Dentistry

## 2011-08-02 DIAGNOSIS — K0530 Chronic periodontitis, unspecified: Secondary | ICD-10-CM

## 2011-08-02 DIAGNOSIS — K08409 Partial loss of teeth, unspecified cause, unspecified class: Secondary | ICD-10-CM

## 2011-08-02 DIAGNOSIS — K08109 Complete loss of teeth, unspecified cause, unspecified class: Secondary | ICD-10-CM

## 2011-08-02 DIAGNOSIS — K036 Deposits [accretions] on teeth: Secondary | ICD-10-CM

## 2011-08-02 DIAGNOSIS — K08199 Complete loss of teeth due to other specified cause, unspecified class: Secondary | ICD-10-CM

## 2011-08-02 MED ORDER — CHLORHEXIDINE GLUCONATE 0.12 % MT SOLN: OROMUCOSAL | Status: DC

## 2011-08-02 MED ORDER — CHLORHEXIDINE GLUCONATE 0.12 % MT SOLN: OROMUCOSAL | Status: AC

## 2011-08-02 NOTE — Progress Notes (Signed)
POST OPERATIVE NOTE:  08/02/2011 Kerri Vasquez [id number]  VITALS: BP 157/86  Pulse 58  Temp(Src) 97.8 F (36.6 C) (Oral)  Kerri Vasquez resents for reevaluation of healing after extractions on 07/03/2011 in the operating room along with gross debridement of remaining dentition. Dismissed several postoperative evaluation appointments. Patient now presents for evaluation of healing prior to anticipated told me replacement with Dr. Lajoyce Corners.  SUBJECTIVE: Patient denies any active dental pain or problems. Patient has no sutures remaining.  EXAM: There is no sign of infection, heme, or ooze. Extraction sites are healed in completely. Patient was some plaque accumulations. Oral hygiene was stressed. Patient does have other dental caries that need to be taken care of by the primary dentist of her choice after the total knee replacement. Patient will need to be cleared medically by Dr. Charlann Boxer at that time. Patient most likely will need antibiotic premedication prior to invasive dental procedures after the total knee replacement.  ASSESSMENT: Post operative course is consistent with dental procedures performed in the operating room on 07/03/2011.   PLAN: 1. Patient is given a prescription for chlorhexidine rinses. Patient is rinse twice daily in the morning at bedtime. This will aid in this infection of the oral cavity. 2. Patient to brush teeth after meals and at bedtime and maintain better oral hygiene to reduce the risk for infection to the anticipated total knee replacement. 3. Patient is to followup with primary dentist of her choice for exam, radiographs, and overall treatment plan discussion once medically cleared by Dr. Lajoyce Corners. 4. Patient is currently cleared for the total knee replacement with Dr. Charlann Boxer.   Charlynne Pander, DDS

## 2011-08-02 NOTE — Telephone Encounter (Signed)
Per Dr [name] - "Call and tell her that her Iron is ok!!!! Only needs Vit B12 for right now!!!" Left message on pt's home answering machine with her results. To call back with any questions or concerns.

## 2011-08-14 ENCOUNTER — Telehealth: Payer: Self-pay | Admitting: *Deleted

## 2011-08-14 NOTE — Telephone Encounter (Addendum)
Message copied by Mirian Capuchin on Mon Aug 14, 2011  2:19 PM ------      Message from: Arlan Organ R      Created: Wed Aug 09, 2011  9:45 PM       Call - iron is much better.  Cindee Lame This message given to pt's daughter.  Voiced understanding.

## 2011-09-04 ENCOUNTER — Other Ambulatory Visit (HOSPITAL_BASED_OUTPATIENT_CLINIC_OR_DEPARTMENT_OTHER): Payer: Medicare Other | Admitting: Lab

## 2011-09-04 ENCOUNTER — Ambulatory Visit: Payer: Self-pay | Admitting: Hematology & Oncology

## 2011-09-21 NOTE — Telephone Encounter (Signed)
Dismissal letter mailed by certified / registered mail. Will update upon delivery of receipt. rmf

## 2011-09-21 NOTE — Telephone Encounter (Signed)
Signed return receipt received verifying delivery of dismissal letter. rmf

## 2011-09-26 DIAGNOSIS — M25569 Pain in unspecified knee: Secondary | ICD-10-CM | POA: Diagnosis not present

## 2011-09-26 DIAGNOSIS — M545 Low back pain: Secondary | ICD-10-CM | POA: Diagnosis not present

## 2011-09-26 DIAGNOSIS — M069 Rheumatoid arthritis, unspecified: Secondary | ICD-10-CM | POA: Diagnosis not present

## 2011-10-05 DIAGNOSIS — G8928 Other chronic postprocedural pain: Secondary | ICD-10-CM | POA: Diagnosis not present

## 2011-10-05 DIAGNOSIS — Z9010 Acquired absence of unspecified breast and nipple: Secondary | ICD-10-CM | POA: Diagnosis not present

## 2011-10-05 DIAGNOSIS — Z853 Personal history of malignant neoplasm of breast: Secondary | ICD-10-CM | POA: Diagnosis not present

## 2011-11-01 DIAGNOSIS — M1710 Unilateral primary osteoarthritis, unspecified knee: Secondary | ICD-10-CM | POA: Diagnosis not present

## 2011-11-01 DIAGNOSIS — M25569 Pain in unspecified knee: Secondary | ICD-10-CM | POA: Diagnosis not present

## 2011-11-03 ENCOUNTER — Other Ambulatory Visit: Payer: Self-pay | Admitting: *Deleted

## 2011-11-07 ENCOUNTER — Emergency Department (HOSPITAL_BASED_OUTPATIENT_CLINIC_OR_DEPARTMENT_OTHER)
Admission: EM | Admit: 2011-11-07 | Discharge: 2011-11-07 | Disposition: A | Discharge status: Home / Self Care | Payer: Medicare Other | Attending: Emergency Medicine | Admitting: Emergency Medicine

## 2011-11-07 ENCOUNTER — Emergency Department (HOSPITAL_BASED_OUTPATIENT_CLINIC_OR_DEPARTMENT_OTHER): Payer: Medicare Other

## 2011-11-07 ENCOUNTER — Encounter (HOSPITAL_BASED_OUTPATIENT_CLINIC_OR_DEPARTMENT_OTHER): Payer: Self-pay

## 2011-11-07 DIAGNOSIS — Z9089 Acquired absence of other organs: Secondary | ICD-10-CM | POA: Insufficient documentation

## 2011-11-07 DIAGNOSIS — E039 Hypothyroidism, unspecified: Secondary | ICD-10-CM | POA: Insufficient documentation

## 2011-11-07 DIAGNOSIS — I10 Essential (primary) hypertension: Secondary | ICD-10-CM | POA: Insufficient documentation

## 2011-11-07 DIAGNOSIS — Z9884 Bariatric surgery status: Secondary | ICD-10-CM | POA: Insufficient documentation

## 2011-11-07 DIAGNOSIS — Z853 Personal history of malignant neoplasm of breast: Secondary | ICD-10-CM | POA: Insufficient documentation

## 2011-11-07 DIAGNOSIS — K439 Ventral hernia without obstruction or gangrene: Secondary | ICD-10-CM | POA: Insufficient documentation

## 2011-11-07 DIAGNOSIS — R109 Unspecified abdominal pain: Secondary | ICD-10-CM | POA: Insufficient documentation

## 2011-11-07 DIAGNOSIS — M5137 Other intervertebral disc degeneration, lumbosacral region: Secondary | ICD-10-CM | POA: Diagnosis not present

## 2011-11-07 DIAGNOSIS — M51379 Other intervertebral disc degeneration, lumbosacral region without mention of lumbar back pain or lower extremity pain: Secondary | ICD-10-CM | POA: Insufficient documentation

## 2011-11-07 LAB — CBC WITH DIFFERENTIAL/PLATELET
Basophils Absolute: 0 10*3/uL (ref 0.0–0.1)
Basophils Relative: 0 % (ref 0–1)
Eosinophils Absolute: 0 10*3/uL (ref 0.0–0.7)
Eosinophils Relative: 0 % (ref 0–5)
HCT: 34.9 % — ABNORMAL LOW (ref 36.0–46.0)
Hemoglobin: 11.9 g/dL — ABNORMAL LOW (ref 12.0–15.0)
Lymphocytes Relative: 5 % — ABNORMAL LOW (ref 12–46)
Lymphs Abs: 0.5 10*3/uL — ABNORMAL LOW (ref 0.7–4.0)
MCH: 33 pg (ref 26.0–34.0)
MCHC: 34.1 g/dL (ref 30.0–36.0)
MCV: 96.7 fL (ref 78.0–100.0)
Monocytes Absolute: 0.4 10*3/uL (ref 0.1–1.0)
Monocytes Relative: 4 % (ref 3–12)
Neutro Abs: 8.9 10*3/uL — ABNORMAL HIGH (ref 1.7–7.7)
Neutrophils Relative %: 90 % — ABNORMAL HIGH (ref 43–77)
Platelets: 229 10*3/uL (ref 150–400)
RBC: 3.61 MIL/uL — ABNORMAL LOW (ref 3.87–5.11)
RDW: 12.9 % (ref 11.5–15.5)
WBC: 9.9 10*3/uL (ref 4.0–10.5)

## 2011-11-07 LAB — COMPREHENSIVE METABOLIC PANEL
ALT: 56 U/L — ABNORMAL HIGH (ref 0–35)
AST: 157 U/L — ABNORMAL HIGH (ref 0–37)
Albumin: 3.6 g/dL (ref 3.5–5.2)
Alkaline Phosphatase: 134 U/L — ABNORMAL HIGH (ref 39–117)
BUN: 16 mg/dL (ref 6–23)
CO2: 25 mEq/L (ref 19–32)
Calcium: 9.2 mg/dL (ref 8.4–10.5)
Chloride: 106 mEq/L (ref 96–112)
Creatinine, Ser: 0.9 mg/dL (ref 0.50–1.10)
GFR calc Af Amer: 81 mL/min — ABNORMAL LOW (ref >90)
GFR calc non Af Amer: 70 mL/min — ABNORMAL LOW (ref >90)
Glucose, Bld: 133 mg/dL — ABNORMAL HIGH (ref 70–99)
Potassium: 3.9 mEq/L (ref 3.5–5.1)
Sodium: 141 mEq/L (ref 135–145)
Total Bilirubin: 0.6 mg/dL (ref 0.3–1.2)
Total Protein: 6.9 g/dL (ref 6.0–8.3)

## 2011-11-07 LAB — URINALYSIS, ROUTINE W REFLEX MICROSCOPIC
Glucose, UA: NEGATIVE mg/dL
Ketones, ur: NEGATIVE mg/dL
Leukocytes, UA: NEGATIVE
Protein, ur: NEGATIVE mg/dL
Urobilinogen, UA: 1 mg/dL (ref 0.0–1.0)

## 2011-11-07 MED ORDER — HYDROMORPHONE HCL PF 1 MG/ML IJ SOLN
1.0000 mg | Freq: Once | INTRAMUSCULAR | Status: AC
  Administered 2011-11-07: 1 mg via INTRAVENOUS
  Filled 2011-11-07: qty 1

## 2011-11-07 MED ORDER — GI COCKTAIL ~~LOC~~
30.0000 mL | Freq: Once | ORAL | Status: AC
  Administered 2011-11-07: 30 mL via ORAL
  Filled 2011-11-07: qty 30

## 2011-11-07 MED ORDER — IOHEXOL 300 MG/ML  SOLN
100.0000 mL | Freq: Once | INTRAMUSCULAR | Status: AC | PRN
  Administered 2011-11-07: 100 mL via INTRAVENOUS

## 2011-11-07 MED ORDER — ONDANSETRON HCL 4 MG/2ML IJ SOLN
4.0000 mg | Freq: Once | INTRAMUSCULAR | Status: AC
  Administered 2011-11-07: 4 mg via INTRAVENOUS
  Filled 2011-11-07: qty 2

## 2011-11-07 MED ORDER — IOHEXOL 300 MG/ML  SOLN: 20.0000 mL | INTRAMUSCULAR | Status: AC

## 2011-11-07 MED ORDER — OXYCODONE-ACETAMINOPHEN 5-325 MG PO TABS: 1.0000 | ORAL_TABLET | Freq: Four times a day (QID) | ORAL | Status: AC | PRN

## 2011-11-07 MED ORDER — PROMETHAZINE HCL 25 MG PO TABS: 25.0000 mg | ORAL_TABLET | Freq: Four times a day (QID) | ORAL | Status: DC | PRN

## 2011-11-07 NOTE — ED Provider Notes (Signed)
History     CSN: [id number]  Arrival date & time 11/07/11  1632   First MD Initiated Contact with Patient 11/07/11 1651      Chief Complaint  Patient presents with  . Abdominal Pain    (Consider location/radiation/quality/duration/timing/severity/associated sxs/prior treatment) HPI Pt with history of multiple abdominal surgeries including open cholecystectomy, laparoscopic gastric bypass and SBO reports she has had a few hours of severe aching epigstric pain, radiating down and around to her back. She has had some vomiting of bilious emesis and watery diarrhea over the last several days. No blood in stool or vomit. No fever. She has noticed a 'bulge' in her epigastric area when she sits up.   Past Medical History  Diagnosis Date  . Cancer 06/01/06    stage II breast cancer  . Depression   . Hypertension   . Migraine   . Hypothyroidism   . Incisional hernia   . Spinal stenosis   . Scoliosis   . Sciatica   . Intestinal obstruction   . Renal insufficiency   . Diabetes mellitus     type II, controlled by diet 05/25/11   . Sleep apnea     hx of off machine x 4 years   . GERD (gastroesophageal reflux disease)   . Septic arthritis 01/28/10    and spinal stenosis , moderate scoliosis   . Osteoarthritis of multiple joints   . Anemia   . Pernicious anemia 05/30/2011    Iron transfusion and VB12 on 06/06/11    Past Surgical History  Procedure Date  . Cholecystectomy 06/29/85  . Tonsillectomy 1969  . Breast surgery 2008     bilateral mastectomy  . Cystectomy 2009    left breast  . Breast reconstruction 2008, 2009  . Bariatric surgery 2006  . Other surgical history     surgery for intestinal blockage 2011   . Multiple extractions with alveoloplasty 06/08/2011    Procedure: MULTIPLE EXTRACION WITH ALVEOLOPLASTY;  Surgeon: Charlynne Pander, DDS;  Location: [facility];  Service: Oral Surgery;  Laterality: N/A;  Extraction of tooth #'s 3,4,7 with alveoloplasty and Gross Debridement of  Remaining Teeth    Family History  Problem Relation Age of Onset  . Cancer Mother     breast  . Hypertension Mother   . Anesthesia problems Daughter     History  Substance Use Topics  . Smoking status: Never Smoker   . Smokeless tobacco: Never Used  . Alcohol Use: No    OB History    Grav Para Term Preterm Abortions TAB SAB Ect Mult Living                  Review of Systems All other systems reviewed and are negative except as noted in HPI.   Allergies  Influenza vac split; Zostavax; and Adhesive  Home Medications   Current Outpatient Rx  Name Route Sig Dispense Refill  . ACETAMINOPHEN-CODEINE #3 300-30 MG PO TABS Oral Take 1 tablet by mouth every 4 (four) hours as needed. Patient uses this medication for pain.    Marland Kitchen BUPROPION HCL ER (XL) 150 MG PO TB24 Oral Take 150 mg by mouth 2 (two) times daily.    Marland Kitchen FIORICET PO Oral Take 2 tablets by mouth every 8 (eight) hours as needed. migraines    . CARVEDILOL 12.5 MG PO TABS Oral Take 1 tablet (12.5 mg total) by mouth 2 (two) times daily. 60 tablet 2  . EXEMESTANE 25 MG PO TABS  Oral Take 25 mg by mouth daily after breakfast.    . FLUOXETINE HCL 20 MG PO CAPS Oral Take 20 mg by mouth 2 (two) times daily.     Marland Kitchen FOLIC ACID 1 MG PO TABS Oral Take 2 mg by mouth daily.     Marland Kitchen LEVOTHYROXINE SODIUM 200 MCG PO TABS Oral Take 1 tablet (200 mcg total) by mouth daily before breakfast. 30 tablet 1  . LIDOCAINE 5 % EX OINT Topical Apply 1 application topically 2 (two) times daily.     Marland Kitchen METHOCARBAMOL 500 MG PO TABS Oral Take 500 mg by mouth 4 (four) times daily.    Marland Kitchen PREDNISONE 5 MG PO TBEC Oral Take 5 mg by mouth daily.     Marland Kitchen TIZANIDINE HCL 4 MG PO TABS Oral Take 1 tablet (4 mg total) by mouth 3 (three) times daily. 90 tablet 0  . TRAMADOL HCL 50 MG PO TABS Oral Take 50 mg by mouth every 6 (six) hours as needed.    . METHOTREXATE SODIUM (PF) IJ Injection Inject 25 mg as directed every Wednesday. Inject 1mL subcutaneously once a week.    .  SUMATRIPTAN SUCCINATE 50 MG PO TABS Oral Take 50 mg by mouth every 2 (two) hours as needed. One tablet by mouth at start of migraine. May repeat in 2 hours once in 24 hrs as needed      BP 160/81  Pulse 71  Temp 98.4 F (36.9 C) (Oral)  Resp 20  SpO2 97%  Physical Exam  Nursing note and vitals reviewed. Constitutional: She is oriented to person, place, and time. She appears well-developed and well-nourished.  HENT:  Head: Normocephalic and atraumatic.  Eyes: EOM are normal. Pupils are equal, round, and reactive to light.  Neck: Normal range of motion. Neck supple.  Cardiovascular: Normal rate, normal heart sounds and intact distal pulses.   Pulmonary/Chest: Effort normal and breath sounds normal.  Abdominal: Soft. Bowel sounds are normal. She exhibits no distension. There is tenderness. There is no rebound and no guarding.       ? Reducible Ventral hernia   Musculoskeletal: Normal range of motion. She exhibits no edema and no tenderness.  Neurological: She is alert and oriented to person, place, and time. She has normal strength. No cranial nerve deficit or sensory deficit.  Skin: Skin is warm and dry. No rash noted.  Psychiatric: She has a normal mood and affect.    ED Course  Procedures (including critical care time)  Labs Reviewed  CBC WITH DIFFERENTIAL - Abnormal; Notable for the following:    RBC 3.61 (*)     Hemoglobin 11.9 (*)     HCT 34.9 (*)     Neutrophils Relative 90 (*)     Neutro Abs 8.9 (*)     Lymphocytes Relative 5 (*)     Lymphs Abs 0.5 (*)     All other components within normal limits  COMPREHENSIVE METABOLIC PANEL - Abnormal; Notable for the following:    Glucose, Bld 133 (*)     AST 157 (*)     ALT 56 (*)     Alkaline Phosphatase 134 (*)     GFR calc non Af Amer 70 (*)     GFR calc Af Amer 81 (*)     All other components within normal limits  LIPASE, BLOOD  URINALYSIS, ROUTINE W REFLEX MICROSCOPIC   Ct Abdomen Pelvis W Contrast  11/07/2011   *RADIOLOGY REPORT*  Clinical Data: Abdominal pain.  Ventral hernia.  CT ABDOMEN AND PELVIS WITH CONTRAST  Technique:  Multidetector CT imaging of the abdomen and pelvis was performed following the standard protocol during bolus administration of intravenous contrast.  Contrast: OMNIPAQUE IOHEXOL 300 MG/ML  SOLN  Comparison: 07/22/2008  Findings: 2.4 cm heterogeneous lesion has developed in the medial segment of the left lobe of the liver on image 22.  Post cholecystectomy.  Post gastric bypass surgery  Spleen, pancreas, adrenal glands are within normal limits.  Chronic changes of the kidneys.  Ventral hernia is present containing small bowel loops.  There is also adipose tissue within the hernia sac.  No disproportionate dilatation of bowel to suggest bowel obstruction.  Normal appendix.  Colon is decompressed.  Small amount of free fluid is seen in the pelvis.  Bladder, uterus, and adnexa are within normal limits.  Severe degenerative changes in the lumbar spine at L1-2.  IMPRESSION: Ventral abdominal hernia containing small bowel without evidence of small bowel obstruction.  New indeterminate lesion in the liver. Differential diagnosis includes a neoplastic an inflammatory etiology.  MRI is recommended.  Postoperative changes.  Original Report Authenticated By: Donavan Burnet, M.D.     No diagnosis found.    MDM  Labs and imaging as above, ventral hernia without incarceration likely source of pain. Will control pain, ensure tolerates PO and recommend Gen Surg followup to discuss repair.   9:14 PM Pt still having some pain, but tolerating PO. Advised to return to the ED for any worsening symptoms, vomiting or for any other concerns.      Shain Pauwels B. Bernette Mayers, MD 11/07/11 2115

## 2011-11-07 NOTE — ED Notes (Signed)
Pt c/o generalized abdominal pain radiating to R side and back.  Pt states onset a couple hours ago.  Pt states she has had a couple episodes of emesis.  Pt states she has HX of bowel obstruction and feels a "bulge" on R side.

## 2011-11-08 ENCOUNTER — Ambulatory Visit (HOSPITAL_BASED_OUTPATIENT_CLINIC_OR_DEPARTMENT_OTHER): Payer: Medicare Other | Admitting: Hematology & Oncology

## 2011-11-08 ENCOUNTER — Other Ambulatory Visit: Payer: Medicare Other | Admitting: Lab

## 2011-11-08 VITALS — BP 147/83 | HR 68 | Temp 98.4°F | Ht 65.0 in | Wt 261.0 lb

## 2011-11-08 DIAGNOSIS — E559 Vitamin D deficiency, unspecified: Secondary | ICD-10-CM | POA: Diagnosis not present

## 2011-11-08 DIAGNOSIS — Z853 Personal history of malignant neoplasm of breast: Secondary | ICD-10-CM

## 2011-11-08 DIAGNOSIS — M2550 Pain in unspecified joint: Secondary | ICD-10-CM | POA: Diagnosis not present

## 2011-11-08 DIAGNOSIS — D509 Iron deficiency anemia, unspecified: Secondary | ICD-10-CM

## 2011-11-08 DIAGNOSIS — K7689 Other specified diseases of liver: Secondary | ICD-10-CM

## 2011-11-08 DIAGNOSIS — C50919 Malignant neoplasm of unspecified site of unspecified female breast: Secondary | ICD-10-CM

## 2011-11-08 DIAGNOSIS — M810 Age-related osteoporosis without current pathological fracture: Secondary | ICD-10-CM

## 2011-11-08 LAB — CBC WITH DIFFERENTIAL (CANCER CENTER ONLY)
BASO#: 0 10*3/uL (ref 0.0–0.2)
Eosinophils Absolute: 0.1 10*3/uL (ref 0.0–0.5)
HCT: 35.2 % (ref 34.8–46.6)
HGB: 12 g/dL (ref 11.6–15.9)
MCH: 33.9 pg (ref 26.0–34.0)
MCV: 99 fL (ref 81–101)
MONO%: 5.7 % (ref 0.0–13.0)
NEUT#: 4.6 10*3/uL (ref 1.5–6.5)
NEUT%: 78.6 % (ref 39.6–80.0)
RBC: 3.54 10*6/uL — ABNORMAL LOW (ref 3.70–5.32)

## 2011-11-08 LAB — IRON AND TIBC
Iron: 144 ug/dL (ref 42–145)
TIBC: 226 ug/dL — ABNORMAL LOW (ref 250–470)
UIBC: 82 ug/dL — ABNORMAL LOW (ref 125–400)

## 2011-11-08 LAB — COMPREHENSIVE METABOLIC PANEL
Albumin: 3.6 g/dL (ref 3.5–5.2)
Alkaline Phosphatase: 154 U/L — ABNORMAL HIGH (ref 39–117)
BUN: 12 mg/dL (ref 6–23)
Calcium: 8.9 mg/dL (ref 8.4–10.5)
Glucose, Bld: 82 mg/dL (ref 70–99)
Potassium: 4.5 mEq/L (ref 3.5–5.3)

## 2011-11-08 MED ORDER — HYDROCODONE-ACETAMINOPHEN 5-325 MG PO TABS: 1.0000 | ORAL_TABLET | Freq: Four times a day (QID) | ORAL | Status: DC | PRN

## 2011-11-08 NOTE — Progress Notes (Signed)
This office note has been dictated.

## 2011-11-08 NOTE — Progress Notes (Signed)
DIAGNOSIS: 1. Iron deficiency anemia. 2. B12 deficiency secondary to gastric bypass. 3. Stage II ductal carcinoma of the, I think, right breast. 4. Rheumatoid arthritis.  CURRENT THERAPY:  Observation.  INTERIM HISTORY:  Kerri Vasquez comes in for a long awaited followup.  We have not seen her since April.  She is yet to have her knee surgery. Now, her daughter needs to have her right hip repaired.  She apparently fell and fractured this.  This has to be done before Kerri Vasquez has her knee surgery.  Kerri Vasquez went to the ER yesterday.  She was having abdominal pain. She had a CT scan which showed a 2.4 cm lesion in the left lobe of the liver.  Of course, the radiologist felt that an MRI was needed to figure out what this was.  She did have some elevated LFTs.  Kerri Vasquez has responded well to the iron that we gave her.  We gave her iron 5 months ago.  She has had no bleeding.  She has a lot of arthralgias.  It is hard to say what the cause of these arthralgias is.  She was on Femara.  I took her off Femara and put her on Aromasin.  I am not sure if the Aromasin is causing issues for her. Again, she has rheumatoid arthritis.  She is on methotrexate.  Again, I am still shocked that she has yet to have knee surgery.  She has a plethora of the issues.  Things seem to keep popping up that prevent her from getting her knee operated on.  She just is crippled by all this pain that she has.  PHYSICAL EXAMINATION:  General:  This is an obese white female in mild distress secondary to chronic pain.  Vital Signs:  Temperature 98.4, pulse 68, respiratory rate 20, blood pressure 147/83.  Weight is 261. Head and Neck Exam:  Shows a normocephalic, atraumatic skull.  There are no ocular or oral lesions.  There are no palpable cervical or supraclavicular lymph nodes.  Lungs:  Clear bilaterally.  Cardiac Exam: Regular rate and rhythm with normal S1 and S2.  There are no murmurs, rubs or  bruits.  Abdominal Exam:  Soft, obese.  She has good bowel sounds.  She has laparotomy scars that are well healed.  She does have a little bit of a ventral abdominal wall hernia, mostly on the right. There is no fluid wave.  There is no palpable hepatosplenomegaly.  Back Exam:  Shows some tenderness over the spine, ribs and hips. Extremities:  Show some tenderness over the long bones.  She has good range of motion of her joints.  There is no joint erythema or warmth. Skin Exam:  No ecchymoses or petechiae.  Neurological Exam:  Shows no focal neurological deficits.  LABORATORY STUDIES:  White cell count 5.8, hemoglobin 12, hematocrit 35.2, platelet count 231.  MCV is 99.  IMPRESSION:  Kerri Vasquez is a 56 year old postmenopausal female.  She has a history of stage II ductal carcinoma of the, I think, right breast.  She has undergone bilateral mastectomy.  She is off aromatase inhibitor therapy right now.  I am still not convinced at all that aromatase inhibitor therapy is causing all of her issues.  Again, the iron that we gave her helped out quite a bit.  She is on about 15 different medications.  These medications certainly could be contributing to her pain issues.  Medications could also be contributing to her elevated LFTs.  We are going to have to commit ourselves in getting an MRI of her liver. We will try to get this done next week.  I would be very surprised if this liver lesion is at all related to recurrent/metastatic breast cancer.  I want see her back in another 3-4 weeks.  Hopefully, she will keep her appointments with [place] this time.  Her daughter should be having her hip surgery coming up relatively soon, so we may have to work around this procedure.    ______________________________ Josph Macho, M.D. PRE/MEDQ  D:  11/08/2011  T:  11/08/2011  Job:  [id number]

## 2011-11-09 DIAGNOSIS — M545 Low back pain: Secondary | ICD-10-CM | POA: Diagnosis not present

## 2011-11-09 DIAGNOSIS — M4120 Other idiopathic scoliosis, site unspecified: Secondary | ICD-10-CM | POA: Diagnosis not present

## 2011-11-09 LAB — VITAMIN D 25 HYDROXY (VIT D DEFICIENCY, FRACTURES): Vit D, 25-Hydroxy: 35 ng/mL (ref 30–89)

## 2011-11-10 ENCOUNTER — Other Ambulatory Visit: Payer: Self-pay | Admitting: Hematology & Oncology

## 2011-11-10 ENCOUNTER — Telehealth: Payer: Self-pay | Admitting: *Deleted

## 2011-11-10 DIAGNOSIS — R945 Abnormal results of liver function studies: Secondary | ICD-10-CM

## 2011-11-10 NOTE — Telephone Encounter (Signed)
Message copied by Anselm Jungling on Fri Nov 10, 2011  3:06 PM ------      Message from: Josph Macho      Created: Fri Nov 10, 2011 11:00 AM       I spoke to her daughter. Her mom's liver tests are high. I'm not sure why that would be. She does not have a family doctor down here. I told her that her mind definitely needed a family doctor to try to help manage these other issues that she has ongoing. Her MRI is due next week. Her iron studies look good. This certainly no iron deficiency. She needs to have her liver function test repeated in 2 weeks. I will set that up he says she does not have a family doctor. Her daughter understands all this.  Cindee Lame

## 2011-11-10 NOTE — Telephone Encounter (Signed)
I spoke to her daughter. Her mom's liver tests are high. I'm not sure why that would be. She does not have a family doctor down here. I told her that her mind definitely needed a family doctor to try to help manage these other issues that she has ongoing. Her MRI is due next week. Her iron studies look good. This certainly no iron deficiency. She needs to have her liver function test repeated in 2 weeks. I will set that up he says she does not have a family doctor. Her daughter understands all this. (Phone call made per dr. [name])

## 2011-11-13 ENCOUNTER — Encounter: Payer: Self-pay | Admitting: *Deleted

## 2011-11-13 ENCOUNTER — Other Ambulatory Visit: Payer: Self-pay | Admitting: *Deleted

## 2011-11-13 ENCOUNTER — Other Ambulatory Visit: Payer: Self-pay | Admitting: Orthopaedic Surgery

## 2011-11-13 DIAGNOSIS — M545 Low back pain: Secondary | ICD-10-CM

## 2011-11-13 DIAGNOSIS — F419 Anxiety disorder, unspecified: Secondary | ICD-10-CM

## 2011-11-13 MED ORDER — DIAZEPAM 5 MG PO TABS: 5.0000 mg | ORAL_TABLET | Freq: Once | ORAL | Status: DC

## 2011-11-14 ENCOUNTER — Ambulatory Visit (HOSPITAL_BASED_OUTPATIENT_CLINIC_OR_DEPARTMENT_OTHER): Payer: Medicare Other

## 2011-11-18 ENCOUNTER — Ambulatory Visit (HOSPITAL_BASED_OUTPATIENT_CLINIC_OR_DEPARTMENT_OTHER)
Admission: RE | Admit: 2011-11-18 | Discharge: 2011-11-18 | Disposition: A | Discharge status: Home / Self Care | Payer: Medicare Other | Source: Ambulatory Visit | Attending: Orthopaedic Surgery | Admitting: Orthopaedic Surgery

## 2011-11-18 ENCOUNTER — Ambulatory Visit (HOSPITAL_BASED_OUTPATIENT_CLINIC_OR_DEPARTMENT_OTHER)
Admission: RE | Admit: 2011-11-18 | Discharge: 2011-11-18 | Disposition: A | Discharge status: Home / Self Care | Payer: Medicare Other | Source: Ambulatory Visit | Attending: Hematology & Oncology | Admitting: Hematology & Oncology

## 2011-11-18 DIAGNOSIS — M810 Age-related osteoporosis without current pathological fracture: Secondary | ICD-10-CM

## 2011-11-18 DIAGNOSIS — M48061 Spinal stenosis, lumbar region without neurogenic claudication: Secondary | ICD-10-CM | POA: Insufficient documentation

## 2011-11-18 DIAGNOSIS — M545 Low back pain, unspecified: Secondary | ICD-10-CM | POA: Diagnosis not present

## 2011-11-18 DIAGNOSIS — K7689 Other specified diseases of liver: Secondary | ICD-10-CM | POA: Diagnosis not present

## 2011-11-18 DIAGNOSIS — M79609 Pain in unspecified limb: Secondary | ICD-10-CM | POA: Diagnosis not present

## 2011-11-18 DIAGNOSIS — Z853 Personal history of malignant neoplasm of breast: Secondary | ICD-10-CM | POA: Diagnosis not present

## 2011-11-18 DIAGNOSIS — C50919 Malignant neoplasm of unspecified site of unspecified female breast: Secondary | ICD-10-CM

## 2011-11-18 MED ORDER — GADOBENATE DIMEGLUMINE 529 MG/ML IV SOLN
20.0000 mL | Freq: Once | INTRAVENOUS | Status: AC | PRN
  Administered 2011-11-18: 20 mL via INTRAVENOUS

## 2011-11-19 ENCOUNTER — Other Ambulatory Visit: Payer: Self-pay

## 2011-11-20 ENCOUNTER — Other Ambulatory Visit: Payer: Self-pay | Admitting: Hematology & Oncology

## 2011-11-20 ENCOUNTER — Other Ambulatory Visit: Payer: Self-pay | Admitting: *Deleted

## 2011-11-20 DIAGNOSIS — C50919 Malignant neoplasm of unspecified site of unspecified female breast: Secondary | ICD-10-CM

## 2011-11-20 DIAGNOSIS — K769 Liver disease, unspecified: Secondary | ICD-10-CM

## 2011-11-20 NOTE — Telephone Encounter (Signed)
Erroneous encounter

## 2011-11-21 ENCOUNTER — Ambulatory Visit (HOSPITAL_BASED_OUTPATIENT_CLINIC_OR_DEPARTMENT_OTHER)
Admission: RE | Admit: 2011-11-21 | Discharge: 2011-11-21 | Disposition: A | Discharge status: Home / Self Care | Payer: Medicare Other | Source: Ambulatory Visit | Attending: Hematology & Oncology | Admitting: Hematology & Oncology

## 2011-11-21 ENCOUNTER — Other Ambulatory Visit: Payer: Self-pay | Admitting: *Deleted

## 2011-11-21 DIAGNOSIS — C50919 Malignant neoplasm of unspecified site of unspecified female breast: Secondary | ICD-10-CM | POA: Insufficient documentation

## 2011-11-21 DIAGNOSIS — I6789 Other cerebrovascular disease: Secondary | ICD-10-CM | POA: Diagnosis not present

## 2011-11-21 DIAGNOSIS — R51 Headache: Secondary | ICD-10-CM | POA: Diagnosis not present

## 2011-11-21 DIAGNOSIS — F419 Anxiety disorder, unspecified: Secondary | ICD-10-CM

## 2011-11-21 DIAGNOSIS — G319 Degenerative disease of nervous system, unspecified: Secondary | ICD-10-CM | POA: Diagnosis not present

## 2011-11-21 MED ORDER — DIAZEPAM 5 MG PO TABS: 5.0000 mg | ORAL_TABLET | Freq: Once | ORAL | Status: DC

## 2011-11-21 MED ORDER — GADOBENATE DIMEGLUMINE 529 MG/ML IV SOLN: 10.0000 mL | Freq: Once | INTRAVENOUS | Status: AC | PRN

## 2011-11-22 ENCOUNTER — Telehealth: Payer: Self-pay | Admitting: *Deleted

## 2011-11-22 ENCOUNTER — Other Ambulatory Visit: Payer: Self-pay | Admitting: *Deleted

## 2011-11-22 ENCOUNTER — Other Ambulatory Visit: Payer: Self-pay | Admitting: Hematology & Oncology

## 2011-11-22 DIAGNOSIS — C50919 Malignant neoplasm of unspecified site of unspecified female breast: Secondary | ICD-10-CM

## 2011-11-22 DIAGNOSIS — R160 Hepatomegaly, not elsewhere classified: Secondary | ICD-10-CM

## 2011-11-22 NOTE — Telephone Encounter (Signed)
Message copied by Anselm Jungling on Wed Nov 22, 2011  9:50 AM ------      Message from: Arlan Organ R      Created: Wed Nov 22, 2011  7:40 AM       Call her daughter and tell her that the MRI is ok.  Kerri Vasquez

## 2011-11-22 NOTE — Telephone Encounter (Signed)
Called patients daughter to let her know that her moms MRI or brain is ok per dr. [name]

## 2011-11-22 NOTE — Progress Notes (Signed)
Received a call from Tiffany in scheduling stating that the wrong order is in the computer for the bx. Wanted to know why it was changed. Explained that per Dr Gustavo Lah understanding, the original order wasn't acceptable therefore it was changed per request. She stated it was the wrong bx to be ordered. Asked for the correct bx test #. Entered [id number] (US guided biopsy). She will be scheduled for 11/27/11 at 1 pm.

## 2011-11-23 ENCOUNTER — Other Ambulatory Visit: Payer: Self-pay | Admitting: Radiology

## 2011-11-23 DIAGNOSIS — M545 Low back pain: Secondary | ICD-10-CM | POA: Diagnosis not present

## 2011-11-23 DIAGNOSIS — M5137 Other intervertebral disc degeneration, lumbosacral region: Secondary | ICD-10-CM | POA: Diagnosis not present

## 2011-11-23 DIAGNOSIS — IMO0002 Reserved for concepts with insufficient information to code with codable children: Secondary | ICD-10-CM | POA: Diagnosis not present

## 2011-11-24 ENCOUNTER — Other Ambulatory Visit (HOSPITAL_BASED_OUTPATIENT_CLINIC_OR_DEPARTMENT_OTHER): Payer: Medicare Other | Admitting: Lab

## 2011-11-24 DIAGNOSIS — C50519 Malignant neoplasm of lower-outer quadrant of unspecified female breast: Secondary | ICD-10-CM | POA: Diagnosis not present

## 2011-11-24 DIAGNOSIS — R7989 Other specified abnormal findings of blood chemistry: Secondary | ICD-10-CM | POA: Diagnosis not present

## 2011-11-24 DIAGNOSIS — R945 Abnormal results of liver function studies: Secondary | ICD-10-CM

## 2011-11-24 LAB — COMPREHENSIVE METABOLIC PANEL
ALT: 14 U/L (ref 0–35)
Albumin: 3.7 g/dL (ref 3.5–5.2)
BUN: 19 mg/dL (ref 6–23)
CO2: 26 mEq/L (ref 19–32)
Calcium: 9 mg/dL (ref 8.4–10.5)
Chloride: 108 mEq/L (ref 96–112)
Creatinine, Ser: 0.96 mg/dL (ref 0.50–1.10)
Potassium: 4.1 mEq/L (ref 3.5–5.3)

## 2011-11-24 NOTE — Telephone Encounter (Signed)
error 

## 2011-11-27 ENCOUNTER — Other Ambulatory Visit: Payer: Self-pay | Admitting: *Deleted

## 2011-11-27 ENCOUNTER — Encounter (HOSPITAL_COMMUNITY): Payer: Self-pay

## 2011-11-27 ENCOUNTER — Ambulatory Visit (HOSPITAL_COMMUNITY)
Admission: RE | Admit: 2011-11-27 | Discharge: 2011-11-27 | Disposition: A | Discharge status: Home / Self Care | Payer: Medicare Other | Source: Ambulatory Visit | Attending: Hematology & Oncology | Admitting: Hematology & Oncology

## 2011-11-27 DIAGNOSIS — Z9010 Acquired absence of unspecified breast and nipple: Secondary | ICD-10-CM | POA: Insufficient documentation

## 2011-11-27 DIAGNOSIS — M069 Rheumatoid arthritis, unspecified: Secondary | ICD-10-CM | POA: Diagnosis not present

## 2011-11-27 DIAGNOSIS — E039 Hypothyroidism, unspecified: Secondary | ICD-10-CM | POA: Diagnosis not present

## 2011-11-27 DIAGNOSIS — D510 Vitamin B12 deficiency anemia due to intrinsic factor deficiency: Secondary | ICD-10-CM | POA: Insufficient documentation

## 2011-11-27 DIAGNOSIS — Z853 Personal history of malignant neoplasm of breast: Secondary | ICD-10-CM | POA: Insufficient documentation

## 2011-11-27 DIAGNOSIS — C787 Secondary malignant neoplasm of liver and intrahepatic bile duct: Secondary | ICD-10-CM | POA: Diagnosis not present

## 2011-11-27 DIAGNOSIS — G4730 Sleep apnea, unspecified: Secondary | ICD-10-CM | POA: Insufficient documentation

## 2011-11-27 DIAGNOSIS — E119 Type 2 diabetes mellitus without complications: Secondary | ICD-10-CM | POA: Diagnosis not present

## 2011-11-27 DIAGNOSIS — R160 Hepatomegaly, not elsewhere classified: Secondary | ICD-10-CM

## 2011-11-27 DIAGNOSIS — I10 Essential (primary) hypertension: Secondary | ICD-10-CM | POA: Insufficient documentation

## 2011-11-27 DIAGNOSIS — K219 Gastro-esophageal reflux disease without esophagitis: Secondary | ICD-10-CM | POA: Insufficient documentation

## 2011-11-27 DIAGNOSIS — K7689 Other specified diseases of liver: Secondary | ICD-10-CM | POA: Diagnosis not present

## 2011-11-27 DIAGNOSIS — R112 Nausea with vomiting, unspecified: Secondary | ICD-10-CM

## 2011-11-27 DIAGNOSIS — C50919 Malignant neoplasm of unspecified site of unspecified female breast: Secondary | ICD-10-CM | POA: Diagnosis not present

## 2011-11-27 LAB — CBC
HCT: 35.6 % — ABNORMAL LOW (ref 36.0–46.0)
MCH: 32.8 pg (ref 26.0–34.0)
MCHC: 33.4 g/dL (ref 30.0–36.0)
MCV: 98.1 fL (ref 78.0–100.0)
RDW: 12.8 % (ref 11.5–15.5)

## 2011-11-27 LAB — APTT: aPTT: 33 seconds (ref 24–37)

## 2011-11-27 MED ORDER — HYDROCODONE-ACETAMINOPHEN 5-325 MG PO TABS
1.0000 | ORAL_TABLET | ORAL | Status: DC | PRN
  Filled 2011-11-27: qty 2

## 2011-11-27 MED ORDER — SODIUM CHLORIDE 0.9 % IV SOLN
Freq: Once | INTRAVENOUS | Status: AC
  Administered 2011-11-27: 12:00:00 via INTRAVENOUS

## 2011-11-27 MED ORDER — PROMETHAZINE HCL 25 MG PO TABS: 25.0000 mg | ORAL_TABLET | Freq: Four times a day (QID) | ORAL | Status: DC | PRN

## 2011-11-27 MED ORDER — FENTANYL CITRATE 0.05 MG/ML IJ SOLN
INTRAMUSCULAR | Status: AC | PRN
  Administered 2011-11-27: 100 ug via INTRAVENOUS
  Administered 2011-11-27: 50 ug via INTRAVENOUS
  Administered 2011-11-27: 100 ug via INTRAVENOUS

## 2011-11-27 MED ORDER — MIDAZOLAM HCL 2 MG/2ML IJ SOLN
INTRAMUSCULAR | Status: AC
  Filled 2011-11-27: qty 6

## 2011-11-27 MED ORDER — MIDAZOLAM HCL 5 MG/5ML IJ SOLN
INTRAMUSCULAR | Status: AC | PRN
  Administered 2011-11-27: 1 mg via INTRAVENOUS
  Administered 2011-11-27: 2 mg via INTRAVENOUS
  Administered 2011-11-27: 1 mg via INTRAVENOUS

## 2011-11-27 MED ORDER — DEXTROSE-NACL 5-0.45 % IV SOLN
INTRAVENOUS | Status: DC
  Administered 2011-11-27: 13:00:00 via INTRAVENOUS

## 2011-11-27 MED ORDER — FENTANYL CITRATE 0.05 MG/ML IJ SOLN
INTRAMUSCULAR | Status: AC
  Filled 2011-11-27: qty 6

## 2011-11-27 NOTE — Procedures (Signed)
Interventional Radiology Procedure Note  Procedure: US guided core biopsy of lesion in left hepatic lobe Complications: No immediate Recommendations:  - Bedrest x 4 hrs then advance to ambulation - DC at 5 hrs, if clinically doing well. - Follow path results  Signed,  [name] Big, MD Vascular & Interventional Radiologist [facility]

## 2011-11-27 NOTE — H&P (Signed)
Kerri Vasquez is an 56 y.o. female.   Chief Complaint: Hx of breast cancer with hepatic lesion worrisome for recurrence. Patient presents today for needle core biopsy for diagnosis confirmation.  HPI: see oncology note below : Josph Macho, MD Physician Signed  Progress Notes 11/08/2011 10:11 PM  Related encounter: Office Visit from 11/08/2011 in [facility]  DIAGNOSIS: 1. Iron deficiency anemia. 2. B12 deficiency secondary to gastric bypass. 3. Stage II ductal carcinoma of the, I think, right breast. 4. Rheumatoid arthritis.   CURRENT THERAPY:  Observation.   INTERIM HISTORY:  Kerri Vasquez comes in for a long awaited followup.  We have not seen her since April.  She is yet to have her knee surgery. Now, her daughter needs to have her right hip repaired.  She apparently fell and fractured this.  This has to be done before Kerri Vasquez has her knee surgery.   Kerri Vasquez went to the ER yesterday.  She was having abdominal pain. She had a CT scan which showed a 2.4 cm lesion in the left lobe of the liver.  Of course, the radiologist felt that an MRI was needed to figure out what this was.  She did have some elevated LFTs.   Kerri Vasquez has responded well to the iron that we gave her.  We gave her iron 5 months ago.  She has had no bleeding.   She has a lot of arthralgias.  It is hard to say what the cause of these arthralgias is.  She was on Femara.  I took her off Femara and put her on Aromasin.  I am not sure if the Aromasin is causing issues for her. Again, she has rheumatoid arthritis.  She is on methotrexate.   Again, I am still shocked that she has yet to have knee surgery.   She has a plethora of the issues.  Things seem to keep popping up that prevent her from getting her knee operated on.   She just is crippled by all this pain that she has.   PHYSICAL EXAMINATION:  General:  This is an obese white female in mild distress secondary  to chronic pain.  Vital Signs:  Temperature 98.4, pulse 68, respiratory rate 20, blood pressure 147/83.  Weight is 261. Head and Neck Exam:  Shows a normocephalic, atraumatic skull.  There are no ocular or oral lesions.  There are no palpable cervical or supraclavicular lymph nodes.  Lungs:  Clear bilaterally. Cardiac Exam: Regular rate and rhythm with normal S1 and S2.  There are no murmurs, rubs or bruits.  Abdominal Exam:  Soft, obese.  She has good bowel sounds.  She has laparotomy scars that are well healed.  She does have a little bit of a ventral abdominal wall hernia, mostly on the right. There is no fluid wave.  There is no palpable hepatosplenomegaly.  Back Exam:  Shows some tenderness over the spine, ribs and hips. Extremities:  Show some tenderness over the long bones.  She has good range of motion of her joints.  There is no joint erythema or warmth. Skin Exam:  No ecchymoses or petechiae.  Neurological Exam:  Shows no focal neurological deficits.   LABORATORY STUDIES:  White cell count 5.8, hemoglobin 12, hematocrit 35.2, platelet count 231.  MCV is 99.   IMPRESSION:  Kerri Vasquez is a 56 year old postmenopausal female.  She has a history of stage II ductal carcinoma of the, I think, right  breast.  She has undergone bilateral mastectomy.   She is off aromatase inhibitor therapy right now.  I am still not convinced at all that aromatase inhibitor therapy is causing all of her issues.   Again, the iron that we gave her helped out quite a bit.   She is on about 15 different medications.  These medications certainly could be contributing to her pain issues.  Medications could also be contributing to her elevated LFTs.   We are going to have to commit ourselves in getting an MRI of her liver. We will try to get this done next week.   I would be very surprised if this liver lesion is at all related to recurrent/metastatic breast cancer.   I want see her back in another  3-4 weeks.  Hopefully, she will keep her appointments with [place] this time.   Her daughter should be having her hip surgery coming up relatively soon, so we may have to work around this procedure.       ______________________________ Josph Macho, M.D. PRE/MEDQ  D:  11/08/2011  T:  11/08/2011  Job:  [id number]  Last signed by: Josph Macho, MD    [11/19/2011 9:00 AM]     Today's examination for biopsy :   Past Medical History  Diagnosis Date  . Cancer 06/01/06    stage II breast cancer  . Depression   . Hypertension   . Migraine   . Hypothyroidism   . Incisional hernia   . Spinal stenosis   . Scoliosis   . Sciatica   . Intestinal obstruction   . Renal insufficiency   . Diabetes mellitus     type II, controlled by diet 05/25/11   . Sleep apnea     hx of off machine x 4 years   . GERD (gastroesophageal reflux disease)   . Septic arthritis 01/28/10    and spinal stenosis , moderate scoliosis   . Osteoarthritis of multiple joints   . Anemia   . Pernicious anemia 05/30/2011    Iron transfusion and VB12 on 06/06/11    Past Surgical History  Procedure Date  . Cholecystectomy 06/29/85  . Tonsillectomy 1969  . Breast surgery 2008     bilateral mastectomy  . Cystectomy 2009    left breast  . Breast reconstruction 2008, 2009  . Bariatric surgery 2006  . Other surgical history     surgery for intestinal blockage 2011   . Multiple extractions with alveoloplasty 06/08/2011    Procedure: MULTIPLE EXTRACION WITH ALVEOLOPLASTY;  Surgeon: Charlynne Pander, DDS;  Location: [facility];  Service: Oral Surgery;  Laterality: N/A;  Extraction of tooth #'s 3,4,7 with alveoloplasty and Gross Debridement of Remaining Teeth    Family History  Problem Relation Age of Onset  . Cancer Mother     breast  . Hypertension Mother   . Anesthesia problems Daughter    Social History:  reports that she has never smoked. She has never used smokeless tobacco. She reports that she does not drink alcohol or  use illicit drugs.  Allergies:  Allergies  Allergen Reactions  . Influenza Vac Split (Flu Virus Vaccine) Other (See Comments)    Joint stiffness and renal failure  . Zostavax (Zoster Vaccine Live ([employer])) Other (See Comments)    Joint stiffness, renal failure  . Adhesive (Tape) Itching and Rash    Results for orders placed during the hospital encounter of 11/27/11 (from the past 48 hour(s))  APTT  Status: Normal   Collection Time   11/27/11 11:50 AM      Component Value Range Comment   aPTT 33  24 - 37 seconds   CBC     Status: Abnormal   Collection Time   11/27/11 11:50 AM      Component Value Range Comment   WBC 6.3  4.0 - 10.5 K/uL    RBC 3.63 (*) 3.87 - 5.11 MIL/uL    Hemoglobin 11.9 (*) 12.0 - 15.0 g/dL    HCT 11.9 (*) 14.7 - 46.0 %    MCV 98.1  78.0 - 100.0 fL    MCH 32.8  26.0 - 34.0 pg    MCHC 33.4  30.0 - 36.0 g/dL    RDW 82.9  56.2 - 13.0 %    Platelets 222  150 - 400 K/uL   PROTIME-INR     Status: Normal   Collection Time   11/27/11 11:50 AM      Component Value Range Comment   Prothrombin Time 13.3  11.6 - 15.2 seconds    INR 0.99  0.00 - 1.49     Review of Systems  Constitutional: Positive for malaise/fatigue. Negative for fever and chills.  Respiratory: Negative for cough, sputum production and shortness of breath.   Cardiovascular: Positive for PND. Negative for chest pain, palpitations and claudication.  Gastrointestinal: Positive for abdominal pain. Negative for heartburn, nausea, vomiting and blood in stool.  Genitourinary: Negative.   Musculoskeletal: Positive for myalgias, back pain and joint pain. Negative for falls.  Neurological: Positive for weakness. Negative for focal weakness, seizures and loss of consciousness.  Endo/Heme/Allergies: Does not bruise/bleed easily.  Psychiatric/Behavioral: Positive for depression. Negative for memory loss. The patient is nervous/anxious. The patient does not have insomnia.     Blood pressure 152/93,  pulse 76, temperature 97.9 F (36.6 C), temperature source Oral, resp. rate 16, SpO2 97.00%. Physical Exam  Constitutional: She is oriented to person, place, and time. She appears well-developed and well-nourished. No distress.  HENT:  Head: Normocephalic and atraumatic.  Cardiovascular: Normal rate, regular rhythm and normal heart sounds.  Exam reveals no gallop and no friction rub.   No murmur heard. Respiratory: Effort normal and breath sounds normal. No respiratory distress. She has no wheezes. She has no rales.  GI: Soft. There is no guarding.  Musculoskeletal: Normal range of motion. She exhibits edema and tenderness.  Neurological: She is alert and oriented to person, place, and time.  Skin: Skin is warm and dry.  Psychiatric: She has a normal mood and affect. Her behavior is normal. Judgment and thought content normal.    Assessment/Plan Procedure details for hepatic lesion needle core biopsy discussed in detail with patient and her daughter. Risks specifically discussed were that of infection, bleeding, inadequate sampling, organ damage, and complications with moderate sedation with her apparent understanding. Labs WNL to proceed with biopsy today. Written consent obtained.   CAMPBELL,PAMELA D 11/27/2011, 1:03 PM

## 2011-11-27 NOTE — Telephone Encounter (Signed)
Pt's dgtr called requesting a Phenergan refill. She is currently still at the hospital recovering from her bx, due to go home shortly. She is going to need to have something at home tonight. Ok to refill Phenergan per Dr [name]. Rx sent via eprescribe to [facility].

## 2011-11-27 NOTE — H&P (Signed)
Agree with above.  Will proceed with US guided core biopsy.   Signed,  [name] Big, MD Vascular & Interventional Radiologist [facility]

## 2011-11-29 ENCOUNTER — Telehealth: Payer: Self-pay | Admitting: Hematology & Oncology

## 2011-11-29 ENCOUNTER — Telehealth: Payer: Self-pay | Admitting: Oncology

## 2011-11-29 DIAGNOSIS — M545 Low back pain, unspecified: Secondary | ICD-10-CM | POA: Diagnosis not present

## 2011-11-29 DIAGNOSIS — Z5181 Encounter for therapeutic drug level monitoring: Secondary | ICD-10-CM | POA: Diagnosis not present

## 2011-11-29 DIAGNOSIS — G609 Hereditary and idiopathic neuropathy, unspecified: Secondary | ICD-10-CM | POA: Diagnosis not present

## 2011-11-29 DIAGNOSIS — Z79899 Other long term (current) drug therapy: Secondary | ICD-10-CM | POA: Diagnosis not present

## 2011-11-29 DIAGNOSIS — M069 Rheumatoid arthritis, unspecified: Secondary | ICD-10-CM | POA: Diagnosis not present

## 2011-11-29 NOTE — Telephone Encounter (Signed)
I left a long a message w/ Kerri Vasquez's dgtr about the biopsy. The biopsy doesn't show metastatic breast cancer. I told her that we would have to await some additional studies to see if we still use hormonal manipulation. Kerri Vasquez has had issues with aromatase inhibitors. Possibly, we will be able to use Faslodex.  We really need to get her set up with some additional scans. We probably will get a PET scan to see if any other areas of disease are noted. Possibly, we might does have the one area that we need to do with.  I told Kerri Vasquez to call me if she has any questions. I know she is having hip issues and these have hip surgery.  I am praying hard for the family as they are also very very nice. Hewitt Shorts

## 2011-11-29 NOTE — Telephone Encounter (Addendum)
Message copied by Lacie Draft on Wed Nov 29, 2011  4:32 PM ------      Message from: Arlan Organ R      Created: Mon Nov 27, 2011  5:50 PM       Call and let her know that livers tests are back to normal.  Pete 4:34 PM 11/29/2011 Called and let patient's daughter, Byrd Hesselbach know liver tests were normal. Teola Bradley, Himani Corona Laural Benes

## 2011-11-30 ENCOUNTER — Telehealth: Payer: Self-pay | Admitting: Hematology & Oncology

## 2011-11-30 ENCOUNTER — Other Ambulatory Visit: Payer: Self-pay | Admitting: Hematology & Oncology

## 2011-11-30 DIAGNOSIS — C50919 Malignant neoplasm of unspecified site of unspecified female breast: Secondary | ICD-10-CM

## 2011-11-30 NOTE — Telephone Encounter (Signed)
Informed patient's daughter of PET scan appointment on 12/11/10 at 8:30am.

## 2011-12-11 ENCOUNTER — Other Ambulatory Visit (HOSPITAL_BASED_OUTPATIENT_CLINIC_OR_DEPARTMENT_OTHER): Payer: Self-pay

## 2011-12-13 ENCOUNTER — Ambulatory Visit (HOSPITAL_BASED_OUTPATIENT_CLINIC_OR_DEPARTMENT_OTHER)
Admission: RE | Admit: 2011-12-13 | Discharge: 2011-12-13 | Disposition: A | Discharge status: Home / Self Care | Payer: Medicare Other | Source: Ambulatory Visit | Attending: Hematology & Oncology | Admitting: Hematology & Oncology

## 2011-12-13 DIAGNOSIS — K439 Ventral hernia without obstruction or gangrene: Secondary | ICD-10-CM | POA: Insufficient documentation

## 2011-12-13 DIAGNOSIS — Z9089 Acquired absence of other organs: Secondary | ICD-10-CM | POA: Diagnosis not present

## 2011-12-13 DIAGNOSIS — C50919 Malignant neoplasm of unspecified site of unspecified female breast: Secondary | ICD-10-CM | POA: Diagnosis not present

## 2011-12-13 DIAGNOSIS — Z9010 Acquired absence of unspecified breast and nipple: Secondary | ICD-10-CM | POA: Diagnosis not present

## 2011-12-13 DIAGNOSIS — C787 Secondary malignant neoplasm of liver and intrahepatic bile duct: Secondary | ICD-10-CM | POA: Insufficient documentation

## 2011-12-13 DIAGNOSIS — N200 Calculus of kidney: Secondary | ICD-10-CM | POA: Insufficient documentation

## 2011-12-13 DIAGNOSIS — I517 Cardiomegaly: Secondary | ICD-10-CM | POA: Insufficient documentation

## 2011-12-13 DIAGNOSIS — Z9884 Bariatric surgery status: Secondary | ICD-10-CM | POA: Diagnosis not present

## 2011-12-13 MED ORDER — FLUDEOXYGLUCOSE F - 18 (FDG) INJECTION
14.8100 | Freq: Once | INTRAVENOUS | Status: AC | PRN
  Administered 2011-12-13: 14.81 via INTRAVENOUS

## 2011-12-19 ENCOUNTER — Other Ambulatory Visit: Payer: Self-pay | Admitting: *Deleted

## 2011-12-19 ENCOUNTER — Other Ambulatory Visit (HOSPITAL_BASED_OUTPATIENT_CLINIC_OR_DEPARTMENT_OTHER): Payer: Medicare Other | Admitting: Lab

## 2011-12-19 ENCOUNTER — Ambulatory Visit (HOSPITAL_BASED_OUTPATIENT_CLINIC_OR_DEPARTMENT_OTHER): Payer: Medicare Other | Admitting: Hematology & Oncology

## 2011-12-19 VITALS — BP 150/70 | HR 88 | Temp 97.0°F | Resp 22 | Ht 66.0 in | Wt 269.0 lb

## 2011-12-19 DIAGNOSIS — R7989 Other specified abnormal findings of blood chemistry: Secondary | ICD-10-CM | POA: Diagnosis not present

## 2011-12-19 DIAGNOSIS — E538 Deficiency of other specified B group vitamins: Secondary | ICD-10-CM | POA: Diagnosis not present

## 2011-12-19 DIAGNOSIS — D509 Iron deficiency anemia, unspecified: Secondary | ICD-10-CM | POA: Diagnosis not present

## 2011-12-19 DIAGNOSIS — K7689 Other specified diseases of liver: Secondary | ICD-10-CM

## 2011-12-19 DIAGNOSIS — C50519 Malignant neoplasm of lower-outer quadrant of unspecified female breast: Secondary | ICD-10-CM

## 2011-12-19 DIAGNOSIS — C50919 Malignant neoplasm of unspecified site of unspecified female breast: Secondary | ICD-10-CM

## 2011-12-19 DIAGNOSIS — E041 Nontoxic single thyroid nodule: Secondary | ICD-10-CM

## 2011-12-19 DIAGNOSIS — E039 Hypothyroidism, unspecified: Secondary | ICD-10-CM

## 2011-12-19 LAB — COMPREHENSIVE METABOLIC PANEL
Albumin: 3.5 g/dL (ref 3.5–5.2)
Alkaline Phosphatase: 92 U/L (ref 39–117)
BUN: 21 mg/dL (ref 6–23)
CO2: 27 mEq/L (ref 19–32)
Calcium: 8.8 mg/dL (ref 8.4–10.5)
Chloride: 106 mEq/L (ref 96–112)
Glucose, Bld: 78 mg/dL (ref 70–99)
Potassium: 4.3 mEq/L (ref 3.5–5.3)
Sodium: 140 mEq/L (ref 135–145)
Total Protein: 6.1 g/dL (ref 6.0–8.3)

## 2011-12-19 LAB — CBC WITH DIFFERENTIAL (CANCER CENTER ONLY)
BASO#: 0 10*3/uL (ref 0.0–0.2)
Eosinophils Absolute: 0.2 10*3/uL (ref 0.0–0.5)
HGB: 12.3 g/dL (ref 11.6–15.9)
LYMPH#: 1.9 10*3/uL (ref 0.9–3.3)
MCH: 33 pg (ref 26.0–34.0)
MONO#: 0.7 10*3/uL (ref 0.1–0.9)
NEUT#: 5.6 10*3/uL (ref 1.5–6.5)
Platelets: 232 10*3/uL (ref 145–400)
RBC: 3.73 10*6/uL (ref 3.70–5.32)
WBC: 8.3 10*3/uL (ref 3.9–10.0)

## 2011-12-19 NOTE — Progress Notes (Signed)
This office note has been dictated.

## 2011-12-19 NOTE — Progress Notes (Signed)
CC:   Sandford Craze, NP  DIAGNOSES: 1. Metastatic breast cancer. 2. Vitamin B12 deficiency. 3. Intermittent iron deficiency anemia.  CURRENT THERAPY:  Observation.  INTERIM HISTORY:  [name] comes in for followup.  Unfortunately we now have diagnosed metastatic breast cancer.  Shockingly enough, we found on a CT scan that she had a questionable abnormality in her liver. This was done because she had some elevated liver function tests.  We followed this up with an MRI.  The MRI confirmed a lesion in the liver. This was done on July 20.  A 2.2 x 2.1 x 2.3 cm lesion was noted in the left hepatic lobe.  This was biopsied.  Biopsied on July 29.  The pathology report [id number]) showed metastatic breast cancer.  The tumor was ER positive, PR positive and HER-2 negative.  We then got a PET scan on her.  This was to assess for metastatic disease elsewhere.  The PET scan did not show any obvious disease elsewhere.  Of course, there was some mention of an area of hypermetabolism in the right thyroid lobe.  This was coming from a 1.4 cm nodule.  There were no other suspicious areas.  She comes in today so that we could discuss how to help her.  She had been on I think Aromasin.  I am not sure how well she was taking this. She was complaining of some arthralgias.  She does have rheumatoid arthritis.  This seems to be under fairly decent control.  She also has B12 deficiency.  She gets B12 from [place].  I think her last B12 was back in April.  She has multiple medications that she takes.  Her appetite has been okay.  She has had no nausea or vomiting.  There has been some slight abdominal discomfort over on the right side.  She has had no cough.  She has had no swallowing difficulties.  She has had no headache.  Of note, she had an MRI of the brain.  This was on July 23.  The MRI of the brain was negative for any metastatic disease.  PHYSICAL EXAMINATION:  General:  This is  an obese white female in no obvious distress.  Vital signs:  97, pulse 88, respiratory rate 22, blood pressure 150/70.  Weight is 269.  Head and neck:  Shows a normocephalic, atraumatic skull.  There are no ocular or oral lesions. There are no palpable cervical or supraclavicular lymph nodes.  Lungs: Clear bilaterally.  Cardiac:  Regular rate and rhythm with normal S1, S2.  There are no murmurs, rubs or bruits.  Abdomen:  Soft with good bowel sounds.  There is no palpable abdominal mass.  She has a laparotomy scar in the right side.  There is no palpable hepatosplenomegaly.  Back:  No tenderness over the spine, ribs or hips. Extremities:  Show no clubbing, cyanosis or edema.  Skin:  No rash, ecchymosis or petechia.  Neurologic:  No focal neurological deficits.  LABORATORY STUDIES:  White cell count 8.3, hemoglobin 12.3, hematocrit 37.1, platelet count is 232.  IMPRESSION:  [name] is a 56 year old white female with metastatic breast cancer now.  She initially had stage II ductal carcinoma of the right breast.  This was diagnosed back in 2008.  I think it is very unusual that she just has the one-sided disease.  I called radiology.  I want to see about them doing a RFA procedure on this lesion.  She will still need systemic therapy  but I feel that if we could ablate this lesion it would help her out in the long run.  I think that she would be a good candidate for Faslodex.  I believe that this would be reasonable to give her.  I talked to Ms. Mahjouba and her daughter about the situation.  They both understand what is going on.  They both understand the fact that this is something that we can treat but cannot be cured.  We will get her set up with the Faslodex in another week or so.  Again, I called radiology about the RFA procedure.  They will set her up for this.  We will plan to get her back to see [place] in another 4 or 5 weeks.  I want to try to coordinate her Faslodex  with the B12 injections.  I do not see that we need to do any Zometa because she does not have any kind of bone metastasis.    ______________________________ Josph Macho, M.D. PRE/MEDQ  D:  12/19/2011  T:  12/19/2011  Job:  [id number]

## 2011-12-21 ENCOUNTER — Ambulatory Visit
Admission: RE | Admit: 2011-12-21 | Discharge: 2011-12-21 | Disposition: A | Discharge status: Home / Self Care | Payer: Medicare Other | Source: Ambulatory Visit | Attending: Hematology & Oncology | Admitting: Hematology & Oncology

## 2011-12-21 VITALS — BP 161/94 | HR 68 | Temp 97.7°F | Resp 17 | Ht 66.0 in | Wt 265.0 lb

## 2011-12-21 DIAGNOSIS — C50919 Malignant neoplasm of unspecified site of unspecified female breast: Secondary | ICD-10-CM

## 2011-12-21 DIAGNOSIS — C787 Secondary malignant neoplasm of liver and intrahepatic bile duct: Secondary | ICD-10-CM | POA: Diagnosis not present

## 2011-12-22 ENCOUNTER — Other Ambulatory Visit: Payer: Self-pay | Admitting: Oncology

## 2011-12-22 NOTE — Consult Note (Addendum)
December 21, 2011  Arlan Organ, MD  [address] [place], [place] [address]  Re:  Kerri Vasquez (DOB: 2056-01-07)  Dear [name]:  Thank you for the referral of your patient, Kerri Vasquez, for consultation regarding her solitary liver metastasis.   As you know, this pleasant 56 year old female was diagnosed with right breast carcinoma approximately five years ago and underwent bilateral mastectomy at that time.  On a recent abdominal CT scan to evaluate abdominal pain and a ventral hernia, a new liver lesion was identified, confirmed on ultrasound-guided FNA biopsy 11/27/11 to represent metastatic breast carcinoma.  PET CT showed no other metastatic disease.  A solitary hypermetabolic right thyroid lesion was incidentally noted.  Past medical history is significant for breast carcinoma as above, arthritis and back pain, depression, diet-controlled diabetes, hypertension, GE reflux post gastric bypass 2006, hyperthyroidism, anemia, previous cholecystectomy in 1987, and rheumatoid arthritis.  Current medications include folic acid 2 mg qday, levothyroxine 200 mcg a day, Coreg 12.5 mg bid, Prozac 40 mg qday, prednisone 50 mg qday, Percocet 5 mg q8h, Flexeril 10 mg qday, tizanadine 4 mg tid, and Phenergan 25 mg qday.   She has allergies to Band-Aid adhesive, flu vaccine and shingles vaccine.  No allergy to latex or iodinated contrast.  She is divorced and lives in [place].  She has one daughter who accompanied her to the consultation. She is retired from the [employer].  Positive caffeine use.  Negative tobacco, alcohol or drug use.   There is a family history of asthma, hypertension, thyroid disease, headaches, and cancer.  On review of systems, negative for myocardial infarction, stroke or bleeding diathesis.  Positive for fatigue, tinnitus, glasses use, abdominal pain, nausea and vomiting, back and joint pain, headaches, migraines, depression.      Page 2 Re:  Kerri Vasquez (DOB:  2056-01-07)  On exam, she is of normal mood and affect. Above ideal body weight.  She is afebrile.  Pulse 68, respirations 17, blood pressure 161/94, 265 lbs., 5'6".  Abdomen is obese and nontender.    Recent labs demonstrate a white blood cell count of 8.3, hemoglobin 12.3, hematocrit 37.1, platelets 232; creatinine 0.94, total bilirubin 0.6, alkaline phosphatase 92, AST 16, ALT 18, protein 6.1, albumin 3.5.  PT 13.3 on 11/27/11.  We spent the majority of the 45 minute consultation discussing the recently diagnosed solitary metastatic breast cancer lesion in her liver.  We discussed treatment options, including surgical resection and percutaneous ablation techniques.  We discussed, in particular, the microwave percutaneous ablation technique, anticipated benefits, possible risks and side effects, time course to resolution, and needs for follow-up.    My impression is that this solitary lesion, less than 3 cm, is in an ideal location for percutaneous microwave ablation.  We may instill a small amount of saline to prevent any collateral damage to the nearby hepatic flexure of the colon.  The lesion is peripheral enough to minimize the risk of central biliary damage or heat sink from central vessels.  The lesion is well away from the diaphragm such that I would anticipate her to have relatively low post procedure pain.  We will plan to do this under a general anesthetic, probably using two probes, under both ultrasound and CT guidance to optimize lesion localization and treatment.  She will go ahead and have her thyroid biopsied in the meantime.  She and her daughter seemed to understand and did ask appropriate questions.  They are motivated to proceed therefore; we  can set this up at her convenience.    I appreciate your referral of this very pleasant patient.  I am optimistic we will be able to help her with her solitary liver metastasis from breast carcinoma.  I will keep you updated with her progress.  I  have reviewed the findings and plan with Dr. Malachy Moan, who concurs.  Sincerely,    Oley Balm, MD  [name] 12/21/11

## 2011-12-25 DIAGNOSIS — G8929 Other chronic pain: Secondary | ICD-10-CM | POA: Diagnosis not present

## 2011-12-25 DIAGNOSIS — M545 Low back pain: Secondary | ICD-10-CM | POA: Diagnosis not present

## 2011-12-25 DIAGNOSIS — G609 Hereditary and idiopathic neuropathy, unspecified: Secondary | ICD-10-CM | POA: Diagnosis not present

## 2011-12-26 ENCOUNTER — Ambulatory Visit (HOSPITAL_BASED_OUTPATIENT_CLINIC_OR_DEPARTMENT_OTHER): Payer: Medicare Other

## 2011-12-26 ENCOUNTER — Ambulatory Visit (HOSPITAL_BASED_OUTPATIENT_CLINIC_OR_DEPARTMENT_OTHER)
Admission: RE | Admit: 2011-12-26 | Discharge: 2011-12-26 | Disposition: A | Discharge status: Home / Self Care | Payer: Medicare Other | Source: Ambulatory Visit | Attending: Hematology & Oncology | Admitting: Hematology & Oncology

## 2011-12-26 VITALS — BP 131/85 | HR 71 | Temp 96.7°F | Resp 18

## 2011-12-26 DIAGNOSIS — D510 Vitamin B12 deficiency anemia due to intrinsic factor deficiency: Secondary | ICD-10-CM

## 2011-12-26 DIAGNOSIS — C50519 Malignant neoplasm of lower-outer quadrant of unspecified female breast: Secondary | ICD-10-CM | POA: Diagnosis not present

## 2011-12-26 DIAGNOSIS — Z853 Personal history of malignant neoplasm of breast: Secondary | ICD-10-CM | POA: Insufficient documentation

## 2011-12-26 DIAGNOSIS — E041 Nontoxic single thyroid nodule: Secondary | ICD-10-CM | POA: Diagnosis not present

## 2011-12-26 DIAGNOSIS — E538 Deficiency of other specified B group vitamins: Secondary | ICD-10-CM | POA: Diagnosis not present

## 2011-12-26 MED ORDER — CYANOCOBALAMIN 1000 MCG/ML IJ SOLN
1000.0000 ug | Freq: Once | INTRAMUSCULAR | Status: AC
  Administered 2011-12-26: 1000 ug via INTRAMUSCULAR

## 2011-12-26 MED ORDER — FULVESTRANT 250 MG/5ML IM SOLN
500.0000 mg | Freq: Once | INTRAMUSCULAR | Status: AC
  Administered 2011-12-26: 500 mg via INTRAMUSCULAR
  Filled 2011-12-26: qty 10

## 2011-12-26 NOTE — Patient Instructions (Signed)
Cyanocobalamin, Vitamin B12 injection What is this medicine? CYANOCOBALAMIN (sye an oh koe BAL a min) is a man made form of vitamin B12. Vitamin B12 is used in the growth of healthy blood cells, nerve cells, and proteins in the body. It also helps with the metabolism of fats and carbohydrates. This medicine is used to treat people who can not absorb vitamin B12. This medicine may be used for other purposes; ask your health care provider or pharmacist if you have questions. What should I tell my health care provider before I take this medicine? They need to know if you have any of these conditions: -kidney disease -Leber's disease -megaloblastic anemia -an unusual or allergic reaction to cyanocobalamin, cobalt, other medicines, foods, dyes, or preservatives -pregnant or trying to get pregnant -breast-feeding How should I use this medicine? This medicine is injected into a muscle or deeply under the skin. It is usually given by a health care professional in a clinic or doctor's office. However, your doctor may teach you how to inject yourself. Follow all instructions. Talk to your pediatrician regarding the use of this medicine in children. Special care may be needed. Overdosage: If you think you have taken too much of this medicine contact a poison control center or emergency room at once. NOTE: This medicine is only for you. Do not share this medicine with others. What if I miss a dose? If you are given your dose at a clinic or doctor's office, call to reschedule your appointment. If you give your own injections and you miss a dose, take it as soon as you can. If it is almost time for your next dose, take only that dose. Do not take double or extra doses. What may interact with this medicine? -colchicine -heavy alcohol intake This list may not describe all possible interactions. Give your health care provider a list of all the medicines, herbs, non-prescription drugs, or dietary supplements you  use. Also tell them if you smoke, drink alcohol, or use illegal drugs. Some items may interact with your medicine. What should I watch for while using this medicine? Visit your doctor or health care professional regularly. You may need blood work done while you are taking this medicine. You may need to follow a special diet. Talk to your doctor. Limit your alcohol intake and avoid smoking to get the best benefit. What side effects may I notice from receiving this medicine? Side effects that you should report to your doctor or health care professional as soon as possible: -allergic reactions like skin rash, itching or hives, swelling of the face, lips, or tongue -blue tint to skin -chest tightness, pain -difficulty breathing, wheezing -dizziness -red, swollen painful area on the leg Side effects that usually do not require medical attention (report to your doctor or health care professional if they continue or are bothersome): -diarrhea -headache This list may not describe all possible side effects. Call your doctor for medical advice about side effects. You may report side effects to FDA at 1-800-FDA-1088. Where should I keep my medicine? Keep out of the reach of children. Store at room temperature between 15 and 30 degrees C (59 and 85 degrees F). Protect from light. Throw away any unused medicine after the expiration date. NOTE: This sheet is a summary. It may not cover all possible information. If you have questions about this medicine, talk to your doctor, pharmacist, or health care provider.  2012, Elsevier/Gold Standard. (07/29/2007 10:10:20 PM)Fulvestrant injection What is this medicine? FULVESTRANT (ful   VES trant) blocks the effects of estrogen. It is used to treat breast cancer in women past the age of menopause. This medicine may be used for other purposes; ask your health care provider or pharmacist if you have questions. What should I tell my health care provider before I take  this medicine? They need to know if you have any of these conditions: -bleeding problems -liver disease -low levels of platelets in the blood -an unusual or allergic reaction to fulvestrant, other medicines, foods, dyes, or preservatives -pregnant or trying to get pregnant -breast-feeding How should I use this medicine? This medicine is for injection into a muscle. It is usually given by a health care professional in a hospital or clinic setting. Talk to your pediatrician regarding the use of this medicine in children. Special care may be needed. Overdosage: If you think you have taken too much of this medicine contact a poison control center or emergency room at once. NOTE: This medicine is only for you. Do not share this medicine with others. What if I miss a dose? It is important not to miss your dose. Call your doctor or health care professional if you are unable to keep an appointment. What may interact with this medicine? -medicines that treat or prevent blood clots like warfarin, enoxaparin, and dalteparin This list may not describe all possible interactions. Give your health care provider a list of all the medicines, herbs, non-prescription drugs, or dietary supplements you use. Also tell them if you smoke, drink alcohol, or use illegal drugs. Some items may interact with your medicine. What should I watch for while using this medicine? Your condition will be monitored carefully while you are receiving this medicine. You will need important blood work done while you are taking this medicine. Do not become pregnant while taking this medicine. Women should inform their doctor if they wish to become pregnant or think they might be pregnant. There is a potential for serious side effects to an unborn child. Talk to your health care professional or pharmacist for more information. What side effects may I notice from receiving this medicine? Side effects that you should report to your doctor  or health care professional as soon as possible: -allergic reactions like skin rash, itching or hives, swelling of the face, lips, or tongue -feeling faint or lightheaded, falls -fever or flu-like symptoms -sore throat -vaginal bleeding Side effects that usually do not require medical attention (report to your doctor or health care professional if they continue or are bothersome): -aches, pains -constipation or diarrhea -headache -hot flashes -nausea, vomiting -pain at site where injected -stomach pain This list may not describe all possible side effects. Call your doctor for medical advice about side effects. You may report side effects to FDA at 1-800-FDA-1088. Where should I keep my medicine? This drug is given in a hospital or clinic and will not be stored at home. NOTE: This sheet is a summary. It may not cover all possible information. If you have questions about this medicine, talk to your doctor, pharmacist, or health care provider.  2012, Elsevier/Gold Standard. (08/26/2007 3:39:24 PM) 

## 2011-12-28 ENCOUNTER — Telehealth: Payer: Self-pay | Admitting: Hematology & Oncology

## 2011-12-28 ENCOUNTER — Other Ambulatory Visit (HOSPITAL_COMMUNITY): Payer: Self-pay | Admitting: Interventional Radiology

## 2011-12-28 ENCOUNTER — Other Ambulatory Visit: Payer: Self-pay | Admitting: Hematology & Oncology

## 2011-12-28 DIAGNOSIS — K769 Liver disease, unspecified: Secondary | ICD-10-CM

## 2011-12-28 DIAGNOSIS — E041 Nontoxic single thyroid nodule: Secondary | ICD-10-CM

## 2011-12-28 NOTE — Telephone Encounter (Signed)
Tammy from [facility] will call Pt will appointment for biopsy

## 2011-12-28 NOTE — Telephone Encounter (Signed)
Called to schedule Thyroid Biopsy. Carrie sent me to [place] at [facility] [phone] said she will determine where this is scheduled. I left message on that line with details for her to call me about appointment

## 2012-01-02 ENCOUNTER — Inpatient Hospital Stay
Admission: RE | Admit: 2012-01-02 | Discharge: 2012-01-02 | Payer: Self-pay | Source: Ambulatory Visit | Attending: Hematology & Oncology | Admitting: Hematology & Oncology

## 2012-01-04 ENCOUNTER — Ambulatory Visit
Admission: RE | Admit: 2012-01-04 | Discharge: 2012-01-04 | Disposition: A | Discharge status: Home / Self Care | Payer: Medicare Other | Source: Ambulatory Visit | Attending: Hematology & Oncology | Admitting: Hematology & Oncology

## 2012-01-04 ENCOUNTER — Other Ambulatory Visit (HOSPITAL_COMMUNITY)
Admission: RE | Admit: 2012-01-04 | Discharge: 2012-01-04 | Disposition: A | Discharge status: Home / Self Care | Payer: Medicare Other | Source: Ambulatory Visit | Attending: Interventional Radiology | Admitting: Interventional Radiology

## 2012-01-04 DIAGNOSIS — E049 Nontoxic goiter, unspecified: Secondary | ICD-10-CM | POA: Insufficient documentation

## 2012-01-04 DIAGNOSIS — E041 Nontoxic single thyroid nodule: Secondary | ICD-10-CM | POA: Diagnosis not present

## 2012-01-08 ENCOUNTER — Other Ambulatory Visit: Payer: Self-pay | Admitting: *Deleted

## 2012-01-08 DIAGNOSIS — G8918 Other acute postprocedural pain: Secondary | ICD-10-CM

## 2012-01-08 MED ORDER — HYDROCODONE-ACETAMINOPHEN 5-325 MG PO TABS: 1.0000 | ORAL_TABLET | Freq: Four times a day (QID) | ORAL | Status: DC | PRN

## 2012-01-08 NOTE — Telephone Encounter (Signed)
Pt's dgtr called requesting the results of her mother's thyroid biopsy and to discuss symptoms she is having from it. She is experiencing painful swallowing and some bruising. Denies choking upon swallowing. Reviewed with Dr [name]. To ice externally and may try Vicodin as OTC meds (Tylenol & Advil) are not working. Also encouraged to eat/drink cold items to help internally. She verbalized understanding and was told to expect for this to last about 1 week. Dr [name] will call her tomorrow with further information about the bx results.

## 2012-01-09 ENCOUNTER — Telehealth (INDEPENDENT_AMBULATORY_CARE_PROVIDER_SITE_OTHER): Payer: Self-pay | Admitting: General Surgery

## 2012-01-09 NOTE — Telephone Encounter (Signed)
Left message for patient to return call. Advised she was being set up with an appointment to see Dr. Derrell Lolling at the request of Dr. [name]. Asked for her to call back to set up appointment.  Per Dr. Derrell Lolling this patient needs to have a 45 minute time slot as a new patient. Dr. Derrell Lolling willing to see the patient on Friday 01/12/12 at 4:00 if she is available. Template will need to be adjusted if patient agrees.

## 2012-01-12 ENCOUNTER — Other Ambulatory Visit (HOSPITAL_COMMUNITY): Payer: Self-pay

## 2012-01-17 ENCOUNTER — Encounter (INDEPENDENT_AMBULATORY_CARE_PROVIDER_SITE_OTHER): Payer: Self-pay | Admitting: Surgery

## 2012-01-17 ENCOUNTER — Encounter (HOSPITAL_COMMUNITY): Payer: Self-pay | Admitting: Pharmacy Technician

## 2012-01-17 ENCOUNTER — Ambulatory Visit (INDEPENDENT_AMBULATORY_CARE_PROVIDER_SITE_OTHER): Payer: Medicare Other | Admitting: Surgery

## 2012-01-17 VITALS — BP 130/78 | HR 68 | Temp 97.3°F | Resp 16 | Ht 66.0 in | Wt 266.5 lb

## 2012-01-17 DIAGNOSIS — D449 Neoplasm of uncertain behavior of unspecified endocrine gland: Secondary | ICD-10-CM

## 2012-01-17 DIAGNOSIS — D440 Neoplasm of uncertain behavior of thyroid gland: Secondary | ICD-10-CM | POA: Insufficient documentation

## 2012-01-17 NOTE — Progress Notes (Signed)
General Surgery [facility]  Chief Complaint  Patient presents with  . New Evaluation    Thyroid nodule - referral from Dr. Arlan Organ    HISTORY: Patient is a 56 year old white female who presents on referral from her medical oncologist with a newly diagnosed right thyroid nodule. Patient has a history of breast cancer. She underwent a PET scan. This showed activity in the right lobe of the thyroid. Subsequent thyroid ultrasound was performed which demonstrated a solitary nodule in the right thyroid lobe measuring 2.3 cm in greatest dimension. Left lobe was small and atrophied without evidence of nodular disease. Patient underwent ultrasound-guided fine-needle aspiration biopsy of the right thyroid nodule. 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  Patient is now referred for consideration of resection for definitive diagnosis.  Patient has had no prior head or neck surgery. She does have a long-standing history of hypothyroidism and takes Synthroid daily. This is followed by her physician in [place]. There is no family history of thyroid cancer. However the patient's mother and daughter are both treated for hypothyroidism. There is no history of other endocrinopathy.  Past Medical History  Diagnosis Date  . Cancer 06/01/06    stage II breast cancer  . Depression   . Hypertension   . Migraine   . Hypothyroidism   . Incisional hernia   . Spinal stenosis   . Scoliosis   . Sciatica   . Intestinal obstruction   . Renal insufficiency   . Diabetes mellitus     type II, controlled by diet 05/25/11   . Sleep apnea     hx of off machine x 4 years   . GERD (gastroesophageal reflux disease)   . Septic arthritis 01/28/10    and spinal stenosis , moderate scoliosis   . Osteoarthritis of multiple joints   . Anemia   . Pernicious anemia 05/30/2011    Iron transfusion and VB12 on 06/06/11  . Trouble swallowing   . Leg swelling   . Abdominal pain   . Constipation     . Nausea & vomiting      Current Outpatient Prescriptions  Medication Sig Dispense Refill  . oxyCODONE-acetaminophen (PERCOCET) 5-325 MG per tablet Take 1 tablet by mouth 3 (three) times daily.      . carvedilol (COREG) 12.5 MG tablet Take 1 tablet (12.5 mg total) by mouth 2 (two) times daily.  60 tablet  2  . cyclobenzaprine (FLEXERIL) 5 MG tablet Take 5 mg by mouth 3 (three) times daily as needed. Muscle spasms      . FLUoxetine (PROZAC) 40 MG capsule Take 40 mg by mouth daily.      . folic acid (FOLVITE) 1 MG tablet Take 2 mg by mouth daily.       Marland Kitchen HYDROcodone-acetaminophen (LORTAB) 7.5-500 MG per tablet Take 1 tablet by mouth every 6 (six) hours as needed for pain.  60 tablet  0  . HYDROcodone-acetaminophen (NORCO) 5-325 MG per tablet Take 1 tablet by mouth every 6 (six) hours as needed for pain.  60 tablet  0  . HYDROcodone-acetaminophen (NORCO/VICODIN) 5-325 MG per tablet Take 1 tablet by mouth every 6 (six) hours as needed. pain  30 tablet  0  . levothyroxine (SYNTHROID, LEVOTHROID) 200 MCG tablet Take 1 tablet (200 mcg total) by mouth daily before breakfast.  30 tablet  1  . PredniSONE 5 MG TBEC Take 15 mg by mouth daily.       Marland Kitchen  promethazine (PHENERGAN) 25 MG tablet Take 1 tablet (25 mg total) by mouth every 6 (six) hours as needed. nausea  30 tablet  1  . SUMAtriptan (IMITREX) 50 MG tablet Take 50 mg by mouth every 2 (two) hours as needed. One tablet by mouth at start of migraine. May repeat in 2 hours once in 24 hrs as needed      . tiZANidine (ZANAFLEX) 4 MG tablet Take 1 tablet (4 mg total) by mouth 3 (three) times daily.  90 tablet  0     Allergies  Allergen Reactions  . Influenza Vac Split (Flu Virus Vaccine) Other (See Comments)    Joint stiffness and renal failure  . Zostavax (Zoster Vaccine Live (Oka-Merck)) Other (See Comments)    Joint stiffness, renal failure  . Adhesive (Tape) Itching and Rash     Family History  Problem Relation Age of Onset  . Cancer  Mother     breast  . Hypertension Mother   . Anesthesia problems Daughter      History   Social History  . Marital Status: Divorced    Spouse Name: N/A    Number of Children: 1  . Years of Education: N/A   Social History Main Topics  . Smoking status: Never Smoker   . Smokeless tobacco: Never Used  . Alcohol Use: No  . Drug Use: No  . Sexually Active: None   Other Topics Concern  . None   Social History Narrative   Caffeine use: 3 cups coffee/tea dailyRegular exercise: no     REVIEW OF SYSTEMS - PERTINENT POSITIVES ONLY: No palpable masses or discomfort in the neck. No compressive symptoms. No changes in voice quality. Patient does note an incisional hernia on the abdomen which has been present that is relatively asymptomatic.  She requests examination today.  EXAM: Filed Vitals:   01/17/12 1142  BP: 130/78  Pulse: 68  Temp: 97.3 F (36.3 C)  Resp: 16    HEENT: normocephalic; pupils equal and reactive; sclerae clear; dentition good; mucous membranes moist NECK:  Palpation reveals a relatively small soft thyroid gland with a dominant nodule in the mid right thyroid lobe which is mildly tender. It measures approximately 2 cm in size; symmetric on extension; no palpable anterior or posterior cervical lymphadenopathy; no supraclavicular masses; no tenderness CHEST: clear to auscultation bilaterally without rales, rhonchi, or wheezes CARDIAC: regular rate and rhythm without significant murmur; peripheral pulses are full ABDOMEN: soft without distension; bowel sounds present; no mass; no hepatosplenomegaly; well-healed surgical incisions in the right subcostal position and midline of the abdomen. There is an obvious bulge to the right of midline. The fascial defect measures approximately 5 cm in size. This augments with Valsalva. It is easily reducible. It is nontender. EXT:  non-tender without edema; no deformity NEURO: no gross focal deficits; no sign of  tremor   LABORATORY RESULTS: See [facility] ([facility]Epic) for most recent results   RADIOLOGY RESULTS: See [facility] (CHL-Epic) for most recent results   IMPRESSION: #1 right thyroid nodule, Hurthle cell changes with cytologic atypia on fine needle aspiration biopsy, 2.3 cm #2 ventral abdominal incisional hernia, reducible #3 metastatic breast cancer #4 history of morbid obesity, status post gastric bypass procedure  PLAN: The patient and I and her daughter had a lengthy discussion regarding the above issues and findings. I provided him with written literature on thyroid disease. We discussed management of the right thyroid nodule.  Patient would like to proceed immediately with resection  of the right thyroid nodule for definitive diagnosis. We discussed risk and benefits of right thyroid lobectomy. We discussed the potential for recurrent nerve injury. We discussed the parathyroid glands and their function. We discussed the potential need for completion thyroidectomy in the event of malignancy. They understand and agree to proceed in the near future.  [name], MD, FACS General & Endocrine Surgery [facility]   Visit Diagnoses: 1. Neoplasm of uncertain behavior of thyroid gland, right lobe     Primary Care Physician: Provider Not In System

## 2012-01-17 NOTE — Patient Instructions (Signed)
[facility] [phone]  THYROID & PARATHYROID SURGERY -- POST OP INSTRUCTIONS  Always review your discharge instruction sheet from the facility where your surgery was performed.  1. A prescription for pain medication may be given to you upon discharge.  Take your pain medication as prescribed.  If narcotic pain medicine is not needed, then you may take acetaminophen (Tylenol) or ibuprofen (Advil) as needed. 2. Take your usually prescribed medications unless otherwise directed. 3. If you need a refill on your pain medication, please contact your pharmacy. They will contact our office to request authorization.  Prescriptions will not be processed after 5 pm or on weekends. 4. Start with a light diet upon arrival home, such as soup and crackers or toast.  Be sure to drink plenty of fluids daily.  Resume your normal diet the day after surgery. 5. Most patients will experience some swelling and bruising on the chest and neck area.  Ice packs will help.  Swelling and bruising can take several days to resolve.  6. It is common to experience some constipation if taking pain medication after surgery.  Increasing fluid intake and taking a stool softener will usually help or prevent this problem.  A mild laxative (Milk of Magnesia or Miralax) should be taken according to package directions if there are no bowel movements after 48 hours. 7. You may remove your bandages 24-48 hours after surgery, and you may shower at that time.  You have steri-strips (small skin tapes) in place directly over the incision.  These strips should be left on the skin for 7-10 days and then removed. 8. You may resume regular (light) daily activities beginning the next day-such as daily self-care, walking, climbing stairs-gradually increasing activities as tolerated.  You may have sexual intercourse when it is comfortable.  Refrain from any heavy lifting or straining until approved by your doctor.  You may drive when  you no longer are taking prescription pain medication, you can comfortably wear a seatbelt, and you can safely maneuver your car and apply brakes. 9. You should see your doctor in the office for a follow-up appointment approximately two to three weeks after your surgery.  Make sure that you call for this appointment within a day or two after you arrive home to insure a convenient appointment time.  WHEN TO CALL YOUR DOCTOR: 1. Fever greater than 101.5 2. Inability to urinate 3. Nausea and/or vomiting - persistent 4. Extreme swelling or bruising 5. Continued bleeding from incision 6. Increased pain, redness, or drainage from the incision 7. Difficulty swallowing or breathing 8. Muscle cramping or spasms 9. Numbness or tingling in hands or around lips  The clinic staff is available to answer your questions during regular business hours.  Please don't hesitate to call and ask to speak to one of the nurses if you have concerns. [facility] [phone]  [email or web address]   

## 2012-01-18 ENCOUNTER — Encounter (HOSPITAL_COMMUNITY): Payer: Self-pay | Admitting: Pharmacy Technician

## 2012-01-19 ENCOUNTER — Other Ambulatory Visit: Payer: Self-pay | Admitting: Lab

## 2012-01-19 ENCOUNTER — Other Ambulatory Visit: Payer: Self-pay | Admitting: Radiology

## 2012-01-19 ENCOUNTER — Encounter (HOSPITAL_COMMUNITY)
Admission: RE | Admit: 2012-01-19 | Discharge: 2012-01-19 | Disposition: A | Discharge status: Home / Self Care | Payer: Medicare Other | Source: Ambulatory Visit | Attending: Interventional Radiology | Admitting: Interventional Radiology

## 2012-01-19 ENCOUNTER — Ambulatory Visit: Payer: Self-pay

## 2012-01-19 ENCOUNTER — Encounter (HOSPITAL_COMMUNITY): Payer: Self-pay

## 2012-01-19 ENCOUNTER — Ambulatory Visit: Payer: Self-pay | Admitting: Hematology & Oncology

## 2012-01-19 DIAGNOSIS — K219 Gastro-esophageal reflux disease without esophagitis: Secondary | ICD-10-CM | POA: Diagnosis not present

## 2012-01-19 DIAGNOSIS — C787 Secondary malignant neoplasm of liver and intrahepatic bile duct: Secondary | ICD-10-CM | POA: Diagnosis not present

## 2012-01-19 DIAGNOSIS — D510 Vitamin B12 deficiency anemia due to intrinsic factor deficiency: Secondary | ICD-10-CM | POA: Diagnosis not present

## 2012-01-19 DIAGNOSIS — C50919 Malignant neoplasm of unspecified site of unspecified female breast: Secondary | ICD-10-CM | POA: Diagnosis not present

## 2012-01-19 DIAGNOSIS — E039 Hypothyroidism, unspecified: Secondary | ICD-10-CM | POA: Diagnosis not present

## 2012-01-19 DIAGNOSIS — Z9884 Bariatric surgery status: Secondary | ICD-10-CM | POA: Diagnosis not present

## 2012-01-19 DIAGNOSIS — G4730 Sleep apnea, unspecified: Secondary | ICD-10-CM | POA: Diagnosis not present

## 2012-01-19 DIAGNOSIS — E119 Type 2 diabetes mellitus without complications: Secondary | ICD-10-CM | POA: Diagnosis not present

## 2012-01-19 DIAGNOSIS — I10 Essential (primary) hypertension: Secondary | ICD-10-CM | POA: Diagnosis not present

## 2012-01-19 LAB — COMPREHENSIVE METABOLIC PANEL
ALT: 13 U/L (ref 0–35)
Albumin: 3.4 g/dL — ABNORMAL LOW (ref 3.5–5.2)
Alkaline Phosphatase: 101 U/L (ref 39–117)
BUN: 26 mg/dL — ABNORMAL HIGH (ref 6–23)
Chloride: 105 mEq/L (ref 96–112)
Potassium: 4.3 mEq/L (ref 3.5–5.1)
Sodium: 138 mEq/L (ref 135–145)
Total Bilirubin: 0.5 mg/dL (ref 0.3–1.2)

## 2012-01-19 LAB — APTT: aPTT: 32 seconds (ref 24–37)

## 2012-01-19 LAB — CBC WITH DIFFERENTIAL/PLATELET
Basophils Relative: 1 % (ref 0–1)
Hemoglobin: 12.4 g/dL (ref 12.0–15.0)
Lymphs Abs: 1.2 10*3/uL (ref 0.7–4.0)
Monocytes Relative: 4 % (ref 3–12)
Neutro Abs: 8.2 10*3/uL — ABNORMAL HIGH (ref 1.7–7.7)
Neutrophils Relative %: 82 % — ABNORMAL HIGH (ref 43–77)
Platelets: 321 10*3/uL (ref 150–400)
RBC: 3.94 MIL/uL (ref 3.87–5.11)

## 2012-01-19 LAB — SURGICAL PCR SCREEN: Staphylococcus aureus: NEGATIVE

## 2012-01-19 LAB — PROTIME-INR: Prothrombin Time: 13.5 seconds (ref 11.6–15.2)

## 2012-01-19 NOTE — Patient Instructions (Addendum)
20      Your procedure is scheduled on:  Friday 01/26/2012   Report to RADIOLOGY  At 0730  AM.  Call this number if you have problems the morning of surgery: [phone]   Remember:   Do not eat food or drink liquids after midnight!  Take these medicines the morning of surgery with A SIP OF WATER: PREDNISONE,CARVEDILOL, PROZAC, lLEVOTHYROXINE   Do not bring valuables to the hospital.  .  Leave suitcase in the car. After surgery it may be brought to your room.  For patients admitted to the hospital, checkout time is 11:00 AM the day of              Discharge.    Special Instructions: See [facility] Preparing  For Surgery Instruction Sheet.  Do not wear jewelry, lotions powders, perfumes. Women do not shave  legs or underarms for 12 hours before showers. Contacts, partial plates, or dentures may not be worn into surgery.                          Patients discharged the day of surgery will not be allowed to drive home.  If going home the same day of surgery, must have someone stay with you first 24 hrs.at home and arrange for someone to drive you home from the              Hospital.   Please read over the following fact sheets that you were given: MRSA              INFORMATION, Blood transfusion sheet, incentive spirometry sheet               Telford Nab.Nazair Fortenberry,RN,BSN [phone]

## 2012-01-23 ENCOUNTER — Ambulatory Visit (HOSPITAL_BASED_OUTPATIENT_CLINIC_OR_DEPARTMENT_OTHER): Payer: Medicare Other

## 2012-01-23 ENCOUNTER — Other Ambulatory Visit (HOSPITAL_BASED_OUTPATIENT_CLINIC_OR_DEPARTMENT_OTHER): Payer: Medicare Other | Admitting: Lab

## 2012-01-23 ENCOUNTER — Ambulatory Visit (HOSPITAL_BASED_OUTPATIENT_CLINIC_OR_DEPARTMENT_OTHER): Payer: Medicare Other | Admitting: Hematology & Oncology

## 2012-01-23 VITALS — BP 153/73 | HR 59 | Temp 98.1°F | Resp 18 | Ht 66.0 in | Wt 270.0 lb

## 2012-01-23 DIAGNOSIS — C787 Secondary malignant neoplasm of liver and intrahepatic bile duct: Secondary | ICD-10-CM

## 2012-01-23 DIAGNOSIS — I429 Cardiomyopathy, unspecified: Secondary | ICD-10-CM

## 2012-01-23 DIAGNOSIS — D510 Vitamin B12 deficiency anemia due to intrinsic factor deficiency: Secondary | ICD-10-CM

## 2012-01-23 DIAGNOSIS — E538 Deficiency of other specified B group vitamins: Secondary | ICD-10-CM | POA: Diagnosis not present

## 2012-01-23 DIAGNOSIS — C50919 Malignant neoplasm of unspecified site of unspecified female breast: Secondary | ICD-10-CM

## 2012-01-23 DIAGNOSIS — C50519 Malignant neoplasm of lower-outer quadrant of unspecified female breast: Secondary | ICD-10-CM

## 2012-01-23 DIAGNOSIS — C73 Malignant neoplasm of thyroid gland: Secondary | ICD-10-CM | POA: Diagnosis not present

## 2012-01-23 DIAGNOSIS — Z5111 Encounter for antineoplastic chemotherapy: Secondary | ICD-10-CM | POA: Diagnosis not present

## 2012-01-23 LAB — COMPREHENSIVE METABOLIC PANEL
Alkaline Phosphatase: 108 U/L (ref 39–117)
BUN: 17 mg/dL (ref 6–23)
CO2: 31 mEq/L (ref 19–32)
Creatinine, Ser: 0.94 mg/dL (ref 0.50–1.10)
Glucose, Bld: 85 mg/dL (ref 70–99)
Total Bilirubin: 0.5 mg/dL (ref 0.3–1.2)
Total Protein: 6.2 g/dL (ref 6.0–8.3)

## 2012-01-23 LAB — CBC WITH DIFFERENTIAL (CANCER CENTER ONLY)
BASO#: 0 10*3/uL (ref 0.0–0.2)
EOS%: 3.4 % (ref 0.0–7.0)
Eosinophils Absolute: 0.2 10*3/uL (ref 0.0–0.5)
HCT: 37.7 % (ref 34.8–46.6)
HGB: 12.5 g/dL (ref 11.6–15.9)
LYMPH%: 26.3 % (ref 14.0–48.0)
MCH: 32.5 pg (ref 26.0–34.0)
MCHC: 33.2 g/dL (ref 32.0–36.0)
MCV: 98 fL (ref 81–101)
MONO%: 9.9 % (ref 0.0–13.0)
NEUT#: 4.2 10*3/uL (ref 1.5–6.5)
NEUT%: 59.8 % (ref 39.6–80.0)
RBC: 3.85 10*6/uL (ref 3.70–5.32)

## 2012-01-23 MED ORDER — CYCLOBENZAPRINE HCL 5 MG PO TABS: 5.0000 mg | ORAL_TABLET | Freq: Three times a day (TID) | ORAL | Status: DC | PRN

## 2012-01-23 MED ORDER — CYANOCOBALAMIN 1000 MCG/ML IJ SOLN
1000.0000 ug | Freq: Once | INTRAMUSCULAR | Status: AC
  Administered 2012-01-23: 1000 ug via INTRAMUSCULAR

## 2012-01-23 MED ORDER — FULVESTRANT 250 MG/5ML IM SOLN
500.0000 mg | Freq: Once | INTRAMUSCULAR | Status: AC
  Administered 2012-01-23: 500 mg via INTRAMUSCULAR
  Filled 2012-01-23: qty 10

## 2012-01-23 MED ORDER — PROMETHAZINE HCL 25 MG PO TABS: 25.0000 mg | ORAL_TABLET | Freq: Four times a day (QID) | ORAL | Status: DC | PRN

## 2012-01-23 MED ORDER — CARVEDILOL 12.5 MG PO TABS: 12.5000 mg | ORAL_TABLET | Freq: Two times a day (BID) | ORAL | Status: DC

## 2012-01-23 NOTE — Patient Instructions (Addendum)
Cyanocobalamin, Vitamin B12 injection What is this medicine? CYANOCOBALAMIN (sye an oh koe BAL a min) is a man made form of vitamin B12. Vitamin B12 is used in the growth of healthy blood cells, nerve cells, and proteins in the body. It also helps with the metabolism of fats and carbohydrates. This medicine is used to treat people who can not absorb vitamin B12. This medicine may be used for other purposes; ask your health care provider or pharmacist if you have questions. What should I tell my health care provider before I take this medicine? They need to know if you have any of these conditions: -kidney disease -Leber's disease -megaloblastic anemia -an unusual or allergic reaction to cyanocobalamin, cobalt, other medicines, foods, dyes, or preservatives -pregnant or trying to get pregnant -breast-feeding How should I use this medicine? This medicine is injected into a muscle or deeply under the skin. It is usually given by a health care professional in a clinic or doctor's office. However, your doctor may teach you how to inject yourself. Follow all instructions. Talk to your pediatrician regarding the use of this medicine in children. Special care may be needed. Overdosage: If you think you have taken too much of this medicine contact a poison control center or emergency room at once. NOTE: This medicine is only for you. Do not share this medicine with others. What if I miss a dose? If you are given your dose at a clinic or doctor's office, call to reschedule your appointment. If you give your own injections and you miss a dose, take it as soon as you can. If it is almost time for your next dose, take only that dose. Do not take double or extra doses. What may interact with this medicine? -colchicine -heavy alcohol intake This list may not describe all possible interactions. Give your health care provider a list of all the medicines, herbs, non-prescription drugs, or dietary supplements you  use. Also tell them if you smoke, drink alcohol, or use illegal drugs. Some items may interact with your medicine. What should I watch for while using this medicine? Visit your doctor or health care professional regularly. You may need blood work done while you are taking this medicine. You may need to follow a special diet. Talk to your doctor. Limit your alcohol intake and avoid smoking to get the best benefit. What side effects may I notice from receiving this medicine? Side effects that you should report to your doctor or health care professional as soon as possible: -allergic reactions like skin rash, itching or hives, swelling of the face, lips, or tongue -blue tint to skin -chest tightness, pain -difficulty breathing, wheezing -dizziness -red, swollen painful area on the leg Side effects that usually do not require medical attention (report to your doctor or health care professional if they continue or are bothersome): -diarrhea -headache This list may not describe all possible side effects. Call your doctor for medical advice about side effects. You may report side effects to FDA at 1-800-FDA-1088. Where should I keep my medicine? Keep out of the reach of children. Store at room temperature between 15 and 30 degrees C (59 and 85 degrees F). Protect from light. Throw away any unused medicine after the expiration date. NOTE: This sheet is a summary. It may not cover all possible information. If you have questions about this medicine, talk to your doctor, pharmacist, or health care provider.  2012, [employer]/Gold Standard. (07/29/2007 10:10:20 PM)Fulvestrant injection What is this medicine? FULVESTRANT (ful  VES trant) blocks the effects of estrogen. It is used to treat breast cancer in women past the age of menopause. This medicine may be used for other purposes; ask your health care provider or pharmacist if you have questions. What should I tell my health care provider before I take  this medicine? They need to know if you have any of these conditions: -bleeding problems -liver disease -low levels of platelets in the blood -an unusual or allergic reaction to fulvestrant, other medicines, foods, dyes, or preservatives -pregnant or trying to get pregnant -breast-feeding How should I use this medicine? This medicine is for injection into a muscle. It is usually given by a health care professional in a hospital or clinic setting. Talk to your pediatrician regarding the use of this medicine in children. Special care may be needed. Overdosage: If you think you have taken too much of this medicine contact a poison control center or emergency room at once. NOTE: This medicine is only for you. Do not share this medicine with others. What if I miss a dose? It is important not to miss your dose. Call your doctor or health care professional if you are unable to keep an appointment. What may interact with this medicine? -medicines that treat or prevent blood clots like warfarin, enoxaparin, and dalteparin This list may not describe all possible interactions. Give your health care provider a list of all the medicines, herbs, non-prescription drugs, or dietary supplements you use. Also tell them if you smoke, drink alcohol, or use illegal drugs. Some items may interact with your medicine. What should I watch for while using this medicine? Your condition will be monitored carefully while you are receiving this medicine. You will need important blood work done while you are taking this medicine. Do not become pregnant while taking this medicine. Women should inform their doctor if they wish to become pregnant or think they might be pregnant. There is a potential for serious side effects to an unborn child. Talk to your health care professional or pharmacist for more information. What side effects may I notice from receiving this medicine? Side effects that you should report to your doctor  or health care professional as soon as possible: -allergic reactions like skin rash, itching or hives, swelling of the face, lips, or tongue -feeling faint or lightheaded, falls -fever or flu-like symptoms -sore throat -vaginal bleeding Side effects that usually do not require medical attention (report to your doctor or health care professional if they continue or are bothersome): -aches, pains -constipation or diarrhea -headache -hot flashes -nausea, vomiting -pain at site where injected -stomach pain This list may not describe all possible side effects. Call your doctor for medical advice about side effects. You may report side effects to FDA at 1-800-FDA-1088. Where should I keep my medicine? This drug is given in a hospital or clinic and will not be stored at home. NOTE: This sheet is a summary. It may not cover all possible information. If you have questions about this medicine, talk to your doctor, pharmacist, or health care provider.  2012, [employer]/Gold Standard. (08/26/2007 3:39:24 PM)

## 2012-01-23 NOTE — Progress Notes (Signed)
This office note has been dictated.

## 2012-01-23 NOTE — Patient Instructions (Signed)
Call with problems.

## 2012-01-23 NOTE — Progress Notes (Signed)
DIAGNOSIS: 1. Metastatic breast cancer, solitary liver metastasis. 2. 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 3. Vitamin B12 deficiency. 4. Severe rheumatoid arthritis. 5. Intermittent iron deficiency anemia.  CURRENT THERAPY: 1. Patient to undergo RFA procedure on 09/27. 2. Patient to have right thyroid lobectomy on October 18. 3. Faslodex 500 mg IM q.month. 4. Vitamin B12 1 mg IM q.month.  INTERIM HISTORY:  [name] comes in for followup.  She has metastatic breast cancer but again her only area of metastasis was in her liver.  She had a solitary metastatic.  Radiation feels that a RFA procedure would help.  She is going to have this done on Friday.  She then saw Dr. Gerrit Friends of surgery.  He will do a right thyroid lobectomy on October 18.  She is doing okay.  She says her problems with pain is from the rheumatoid arthritis.  She has had no nausea or vomiting.  There has been no cough or shortness of breath.  She has had no headache.  PHYSICAL EXAMINATION:  General:  This is an obese white female in no obvious distress.  Vital signs:  98.1, pulse 59, respiratory rate 18, blood pressure 153/73.  Weight is 270.  Head and neck:  Shows a normocephalic, atraumatic skull.  There are no ocular or oral lesions. There are no palpable cervical or supraclavicular lymph nodes.  Lungs: Clear to percussion and auscultation bilaterally.  Cardiac:  Regular rate and rhythm with a normal S1 and S2.  There are no murmurs, rubs or bruits.  Abdominal:  Soft with good bowel sounds.  There is no palpable abdominal mass.  There is no fluid wave.  There is no palpable hepatosplenomegaly.  Extremities:  Shows no clubbing, cyanosis or edema. Neurological:  Shows no focal neurological deficits.  LABORATORY STUDIES:  White cell count is 7.1, hemoglobin 12.5, hematocrit 37.7, platelet count is 256.  IMPRESSION:  [name] is a 56 year old white female with metastatic breast cancer.   Again, she only has the 1 area in her liver that is the site of metastatic disease.  We will do the RFA procedure.  We will continue her on the Faslodex. Again, it is hard to say how long we need to continue the Faslodex given that she does not have any metastatic disease elsewhere.  I will go ahead and give her her B12 shot today.  She will get her Faslodex today.  We will go ahead and refill her prescriptions for Flexeril, Phenergan and Coreg.  I want to see her back in 1 month.  By then she would have had her procedures done.  I will not do another scan on her for restaging probably until November.    ______________________________ Josph Macho, M.D. PRE/MEDQ  D:  01/23/2012  T:  01/23/2012  Job:  [id number]

## 2012-01-25 ENCOUNTER — Other Ambulatory Visit: Payer: Self-pay | Admitting: Radiology

## 2012-01-26 ENCOUNTER — Observation Stay (HOSPITAL_COMMUNITY)
Admission: RE | Admit: 2012-01-26 | Discharge: 2012-01-27 | Disposition: A | Discharge status: Home / Self Care | Payer: Medicare Other | Source: Ambulatory Visit | Attending: Interventional Radiology | Admitting: Interventional Radiology

## 2012-01-26 ENCOUNTER — Ambulatory Visit (HOSPITAL_COMMUNITY): Payer: Medicare Other | Admitting: Anesthesiology

## 2012-01-26 ENCOUNTER — Ambulatory Visit (HOSPITAL_COMMUNITY)
Admission: RE | Admit: 2012-01-26 | Discharge: 2012-01-26 | Disposition: A | Discharge status: Home / Self Care | Payer: Medicare Other | Source: Ambulatory Visit | Attending: Interventional Radiology | Admitting: Interventional Radiology

## 2012-01-26 ENCOUNTER — Encounter (HOSPITAL_COMMUNITY): Payer: Self-pay | Admitting: Anesthesiology

## 2012-01-26 ENCOUNTER — Encounter (HOSPITAL_COMMUNITY): Payer: Self-pay | Admitting: *Deleted

## 2012-01-26 ENCOUNTER — Encounter (HOSPITAL_COMMUNITY)
Admission: RE | Disposition: A | Discharge status: Home / Self Care | Payer: Self-pay | Source: Ambulatory Visit | Attending: Interventional Radiology

## 2012-01-26 DIAGNOSIS — K7689 Other specified diseases of liver: Secondary | ICD-10-CM | POA: Diagnosis not present

## 2012-01-26 DIAGNOSIS — M1990 Unspecified osteoarthritis, unspecified site: Secondary | ICD-10-CM | POA: Diagnosis present

## 2012-01-26 DIAGNOSIS — K769 Liver disease, unspecified: Secondary | ICD-10-CM

## 2012-01-26 DIAGNOSIS — E039 Hypothyroidism, unspecified: Secondary | ICD-10-CM | POA: Diagnosis not present

## 2012-01-26 DIAGNOSIS — G4730 Sleep apnea, unspecified: Secondary | ICD-10-CM | POA: Insufficient documentation

## 2012-01-26 DIAGNOSIS — C787 Secondary malignant neoplasm of liver and intrahepatic bile duct: Principal | ICD-10-CM | POA: Insufficient documentation

## 2012-01-26 DIAGNOSIS — R7989 Other specified abnormal findings of blood chemistry: Secondary | ICD-10-CM | POA: Diagnosis present

## 2012-01-26 DIAGNOSIS — E119 Type 2 diabetes mellitus without complications: Secondary | ICD-10-CM | POA: Insufficient documentation

## 2012-01-26 DIAGNOSIS — I429 Cardiomyopathy, unspecified: Secondary | ICD-10-CM

## 2012-01-26 DIAGNOSIS — D649 Anemia, unspecified: Secondary | ICD-10-CM | POA: Diagnosis present

## 2012-01-26 DIAGNOSIS — C50919 Malignant neoplasm of unspecified site of unspecified female breast: Secondary | ICD-10-CM | POA: Diagnosis not present

## 2012-01-26 DIAGNOSIS — D510 Vitamin B12 deficiency anemia due to intrinsic factor deficiency: Secondary | ICD-10-CM | POA: Insufficient documentation

## 2012-01-26 DIAGNOSIS — K219 Gastro-esophageal reflux disease without esophagitis: Secondary | ICD-10-CM | POA: Diagnosis present

## 2012-01-26 DIAGNOSIS — I10 Essential (primary) hypertension: Secondary | ICD-10-CM | POA: Insufficient documentation

## 2012-01-26 DIAGNOSIS — Z9884 Bariatric surgery status: Secondary | ICD-10-CM | POA: Insufficient documentation

## 2012-01-26 LAB — GLUCOSE, CAPILLARY: Glucose-Capillary: 79 mg/dL (ref 70–99)

## 2012-01-26 LAB — TYPE AND SCREEN
ABO/RH(D): O POS
Antibody Screen: NEGATIVE

## 2012-01-26 SURGERY — RADIO FREQUENCY ABLATION
Anesthesia: General | Wound class: Clean

## 2012-01-26 MED ORDER — DOCUSATE SODIUM 100 MG PO CAPS
100.0000 mg | ORAL_CAPSULE | Freq: Two times a day (BID) | ORAL | Status: DC
  Administered 2012-01-26 – 2012-01-27 (×2): 100 mg via ORAL
  Filled 2012-01-26 (×3): qty 1

## 2012-01-26 MED ORDER — ENOXAPARIN SODIUM 30 MG/0.3ML ~~LOC~~ SOLN
30.0000 mg | Freq: Two times a day (BID) | SUBCUTANEOUS | Status: DC
  Administered 2012-01-26 – 2012-01-27 (×2): 30 mg via SUBCUTANEOUS
  Filled 2012-01-26 (×4): qty 0.3

## 2012-01-26 MED ORDER — LACTATED RINGERS IV SOLN
INTRAVENOUS | Status: DC | PRN
  Administered 2012-01-26: 08:00:00 via INTRAVENOUS

## 2012-01-26 MED ORDER — HYDROCODONE-ACETAMINOPHEN 5-325 MG PO TABS: 1.0000 | ORAL_TABLET | ORAL | Status: DC | PRN

## 2012-01-26 MED ORDER — CIPROFLOXACIN IN D5W 400 MG/200ML IV SOLN
INTRAVENOUS | Status: AC
  Filled 2012-01-26: qty 200

## 2012-01-26 MED ORDER — CEFAZOLIN SODIUM-DEXTROSE 2-3 GM-% IV SOLR
2.0000 g | Freq: Three times a day (TID) | INTRAVENOUS | Status: DC
  Administered 2012-01-26: 2 g via INTRAVENOUS
  Filled 2012-01-26: qty 50

## 2012-01-26 MED ORDER — HYDROMORPHONE HCL PF 1 MG/ML IJ SOLN
INTRAMUSCULAR | Status: AC
  Filled 2012-01-26: qty 1

## 2012-01-26 MED ORDER — SODIUM CHLORIDE 0.9 % IJ SOLN: 9.0000 mL | INTRAMUSCULAR | Status: DC | PRN

## 2012-01-26 MED ORDER — NEOSTIGMINE METHYLSULFATE 1 MG/ML IJ SOLN
INTRAMUSCULAR | Status: DC | PRN
  Administered 2012-01-26: 5 mg via INTRAVENOUS

## 2012-01-26 MED ORDER — ONDANSETRON HCL 4 MG/2ML IJ SOLN: 4.0000 mg | Freq: Four times a day (QID) | INTRAMUSCULAR | Status: DC | PRN

## 2012-01-26 MED ORDER — FENTANYL CITRATE 0.05 MG/ML IJ SOLN
INTRAMUSCULAR | Status: DC | PRN
  Administered 2012-01-26: 50 ug via INTRAVENOUS
  Administered 2012-01-26: 100 ug via INTRAVENOUS
  Administered 2012-01-26: 50 ug via INTRAVENOUS
  Administered 2012-01-26 (×2): 25 ug via INTRAVENOUS

## 2012-01-26 MED ORDER — TIZANIDINE HCL 4 MG PO TABS
4.0000 mg | ORAL_TABLET | Freq: Three times a day (TID) | ORAL | Status: DC
  Administered 2012-01-26 – 2012-01-27 (×3): 4 mg via ORAL
  Filled 2012-01-26 (×5): qty 1

## 2012-01-26 MED ORDER — ONDANSETRON HCL 4 MG/2ML IJ SOLN
INTRAMUSCULAR | Status: DC | PRN
  Administered 2012-01-26 (×2): 2 mg via INTRAVENOUS

## 2012-01-26 MED ORDER — CARVEDILOL 12.5 MG PO TABS
12.5000 mg | ORAL_TABLET | Freq: Two times a day (BID) | ORAL | Status: DC
  Administered 2012-01-26 – 2012-01-27 (×2): 12.5 mg via ORAL
  Filled 2012-01-26 (×4): qty 1

## 2012-01-26 MED ORDER — CIPROFLOXACIN IN D5W 400 MG/200ML IV SOLN: 400.0000 mg | Freq: Once | INTRAVENOUS | Status: DC

## 2012-01-26 MED ORDER — LEVOTHYROXINE SODIUM 200 MCG PO TABS
200.0000 ug | ORAL_TABLET | Freq: Every day | ORAL | Status: DC
Start: 2012-01-27 — End: 2012-01-27
  Administered 2012-01-27: 200 ug via ORAL
  Filled 2012-01-26 (×2): qty 1

## 2012-01-26 MED ORDER — SENNOSIDES-DOCUSATE SODIUM 8.6-50 MG PO TABS
1.0000 | ORAL_TABLET | Freq: Every day | ORAL | Status: DC | PRN
  Filled 2012-01-26: qty 1

## 2012-01-26 MED ORDER — DEXAMETHASONE SODIUM PHOSPHATE 4 MG/ML IJ SOLN
INTRAMUSCULAR | Status: DC | PRN
  Administered 2012-01-26: 10 mg via INTRAVENOUS

## 2012-01-26 MED ORDER — DIPHENHYDRAMINE HCL 50 MG/ML IJ SOLN: 12.5000 mg | Freq: Four times a day (QID) | INTRAMUSCULAR | Status: DC | PRN

## 2012-01-26 MED ORDER — DIPHENHYDRAMINE HCL 12.5 MG/5ML PO ELIX: 12.5000 mg | ORAL_SOLUTION | Freq: Four times a day (QID) | ORAL | Status: DC | PRN

## 2012-01-26 MED ORDER — ALUM & MAG HYDROXIDE-SIMETH 200-200-20 MG/5ML PO SUSP
30.0000 mL | ORAL | Status: DC | PRN
  Filled 2012-01-26: qty 30

## 2012-01-26 MED ORDER — CISATRACURIUM BESYLATE (PF) 10 MG/5ML IV SOLN
INTRAVENOUS | Status: DC | PRN
  Administered 2012-01-26: 10 mg via INTRAVENOUS
  Administered 2012-01-26: 2 mg via INTRAVENOUS
  Administered 2012-01-26: 5 mg via INTRAVENOUS
  Administered 2012-01-26: 3 mg via INTRAVENOUS

## 2012-01-26 MED ORDER — CIPROFLOXACIN IN D5W 400 MG/200ML IV SOLN
400.0000 mg | Freq: Two times a day (BID) | INTRAVENOUS | Status: DC
  Administered 2012-01-26: 400 mg via INTRAVENOUS
  Filled 2012-01-26: qty 200

## 2012-01-26 MED ORDER — NALOXONE HCL 0.4 MG/ML IJ SOLN: 0.4000 mg | INTRAMUSCULAR | Status: DC | PRN

## 2012-01-26 MED ORDER — FLUOXETINE HCL 20 MG PO CAPS
40.0000 mg | ORAL_CAPSULE | Freq: Every day | ORAL | Status: DC
  Administered 2012-01-26 – 2012-01-27 (×2): 40 mg via ORAL
  Filled 2012-01-26 (×3): qty 2

## 2012-01-26 MED ORDER — HYDROMORPHONE HCL PF 1 MG/ML IJ SOLN
0.5000 mg | INTRAMUSCULAR | Status: DC | PRN
  Administered 2012-01-26 (×2): 0.5 mg via INTRAVENOUS

## 2012-01-26 MED ORDER — HYDROMORPHONE 0.3 MG/ML IV SOLN
INTRAVENOUS | Status: AC
  Filled 2012-01-26: qty 25

## 2012-01-26 MED ORDER — LACTATED RINGERS IV SOLN: INTRAVENOUS | Status: DC

## 2012-01-26 MED ORDER — HYDROMORPHONE HCL PF 1 MG/ML IJ SOLN
0.2500 mg | INTRAMUSCULAR | Status: DC | PRN
  Administered 2012-01-26 (×4): 0.5 mg via INTRAVENOUS

## 2012-01-26 MED ORDER — HYDROMORPHONE HCL PF 1 MG/ML IJ SOLN
INTRAMUSCULAR | Status: DC | PRN
  Administered 2012-01-26 (×4): 0.5 mg via INTRAVENOUS

## 2012-01-26 MED ORDER — KETAMINE HCL 10 MG/ML IJ SOLN
INTRAMUSCULAR | Status: DC | PRN
  Administered 2012-01-26 (×2): 5 mg via INTRAVENOUS
  Administered 2012-01-26: 10 mg via INTRAVENOUS
  Administered 2012-01-26: 20 mg via INTRAVENOUS
  Administered 2012-01-26 (×2): 5 mg via INTRAVENOUS

## 2012-01-26 MED ORDER — LIDOCAINE HCL (CARDIAC) 20 MG/ML IV SOLN
INTRAVENOUS | Status: DC | PRN
  Administered 2012-01-26: 30 mg via INTRAVENOUS

## 2012-01-26 MED ORDER — HYDROMORPHONE 0.3 MG/ML IV SOLN
INTRAVENOUS | Status: DC
  Administered 2012-01-26: 1.5 mg via INTRAVENOUS
  Administered 2012-01-26: 3 mL via INTRAVENOUS
  Administered 2012-01-26: 0.3 mg via INTRAVENOUS
  Administered 2012-01-27: 1.2 mg via INTRAVENOUS
  Administered 2012-01-27: 0.9 mg via INTRAVENOUS
  Administered 2012-01-27: 1.2 mg via INTRAVENOUS

## 2012-01-26 MED ORDER — CEFAZOLIN SODIUM-DEXTROSE 2-3 GM-% IV SOLR: 2.0000 g | Freq: Once | INTRAVENOUS | Status: DC

## 2012-01-26 MED ORDER — PROPOFOL 10 MG/ML IV EMUL
INTRAVENOUS | Status: DC | PRN
  Administered 2012-01-26: 250 mg via INTRAVENOUS

## 2012-01-26 MED ORDER — DEXTROSE 5 % IV SOLN
3.0000 g | INTRAVENOUS | Status: AC
  Administered 2012-01-26: 3 g via INTRAVENOUS

## 2012-01-26 MED ORDER — MIDAZOLAM HCL 5 MG/5ML IJ SOLN
INTRAMUSCULAR | Status: DC | PRN
  Administered 2012-01-26: 2 mg via INTRAVENOUS

## 2012-01-26 MED ORDER — SUMATRIPTAN SUCCINATE 50 MG PO TABS
50.0000 mg | ORAL_TABLET | ORAL | Status: DC | PRN
  Filled 2012-01-26: qty 1

## 2012-01-26 MED ORDER — CIPROFLOXACIN IN D5W 400 MG/200ML IV SOLN
INTRAVENOUS | Status: DC | PRN
  Administered 2012-01-26: 400 mg via INTRAVENOUS

## 2012-01-26 MED ORDER — SUCCINYLCHOLINE CHLORIDE 20 MG/ML IJ SOLN
INTRAMUSCULAR | Status: DC | PRN
  Administered 2012-01-26: 150 mg via INTRAVENOUS

## 2012-01-26 MED ORDER — GLYCOPYRROLATE 0.2 MG/ML IJ SOLN
INTRAMUSCULAR | Status: DC | PRN
  Administered 2012-01-26: 0.6 mg via INTRAVENOUS

## 2012-01-26 MED ORDER — SODIUM CHLORIDE 0.9 % IV SOLN
INTRAVENOUS | Status: DC
  Administered 2012-01-26: 22:00:00 via INTRAVENOUS
  Administered 2012-01-26: 1000 mL via INTRAVENOUS
  Administered 2012-01-27: 07:00:00 via INTRAVENOUS

## 2012-01-26 NOTE — Procedures (Signed)
CT guided RF ablation liver metastasis Operators: Lia Hopping No specimens.  No complication. No blood loss. See complete dictation in [facility].

## 2012-01-26 NOTE — Anesthesia Postprocedure Evaluation (Signed)
  Anesthesia Post-op Note  Patient: Kerri Vasquez  Procedure(s) Performed: Procedure(s) (LRB): RADIO FREQUENCY ABLATION (N/A)  Patient Location: PACU  Anesthesia Type: General  Level of Consciousness: awake and alert   Airway and Oxygen Therapy: Patient Spontanous Breathing  Post-op Pain: mild  Post-op Assessment: Post-op Vital signs reviewed, Patient's Cardiovascular Status Stable, Respiratory Function Stable, Patent Airway and No signs of Nausea or vomiting  Post-op Vital Signs: stable  Complications: No apparent anesthesia complications

## 2012-01-26 NOTE — Anesthesia Preprocedure Evaluation (Addendum)
Anesthesia Evaluation  Patient identified by MRN, date of birth, ID band Patient awake    Reviewed: Allergy & Precautions, H&P , NPO status , Patient's Chart, lab work & pertinent test results, reviewed documented beta blocker date and time   Airway Mallampati: II TM Distance: >3 FB Neck ROM: full    Dental  (+) Chipped and Missing,    Pulmonary sleep apnea and Continuous Positive Airway Pressure Ventilation ,  breath sounds clear to auscultation  Pulmonary exam normal       Cardiovascular hypertension, Pt. on medications Rhythm:regular Rate:Normal     Neuro/Psych  Headaches, Depression  Neuromuscular disease negative neurological ROS  negative psych ROS   GI/Hepatic negative GI ROS, Neg liver ROS, GERD-  Medicated and Controlled,  Endo/Other  negative endocrine ROSdiabetes, Well Controlled, Type 2, Oral Hypoglycemic AgentsHypothyroidism Morbid obesity  Renal/GU negative Renal ROS  negative genitourinary   Musculoskeletal  (+) Arthritis -, Rheumatoid disorders,    Abdominal (+) + obese,   Peds  Hematology  (+) Blood dyscrasia, anemia , Pernicious anemia 2013   Anesthesia Other Findings Breast Ca  Reproductive/Obstetrics negative OB ROS                         Anesthesia Physical Anesthesia Plan  ASA: III  Anesthesia Plan: General   Post-op Pain Management:    Induction: Intravenous  Airway Management Planned: Oral ETT  Additional Equipment:   Intra-op Plan:   Post-operative Plan: Extubation in OR  Informed Consent: I have reviewed the patients History and Physical, chart, labs and discussed the procedure including the risks, benefits and alternatives for the proposed anesthesia with the patient or authorized representative who has indicated his/her understanding and acceptance.   Dental Advisory Given  Plan Discussed with: Surgeon  Anesthesia Plan Comments:          Anesthesia Quick Evaluation

## 2012-01-26 NOTE — Transfer of Care (Signed)
Immediate Anesthesia Transfer of Care Note  Patient: Kerri Vasquez  Procedure(s) Performed: Procedure(s) (LRB) with comments: RADIO FREQUENCY ABLATION (N/A)  Patient Location: PACU  Anesthesia Type: General  Level of Consciousness: awake, alert , oriented and patient cooperative  Airway & Oxygen Therapy: Patient Spontanous Breathing and Patient connected to face mask oxygen  Post-op Assessment: Report given to PACU RN and Post -op Vital signs reviewed and stable  Post vital signs: Reviewed and stable  Complications: No apparent anesthesia complications

## 2012-01-26 NOTE — H&P (Signed)
Kerri Vasquez is an 56 y.o. female.   Chief Complaint: Solitary hepatic lesion. Patient presents today for microwave ablation treatment of this lesion. Biopsy proven breast metastatic disease.  HPI: History of breast cancer - now with hepatic lesion - see consultation note below from clinic office visit :   *RADIOLOGY REPORT*   NEW PATIENT OFFICE VISIT - LEVEL III ([id number])   December 21, 2011   Arlan Organ, MD [address] [place], [place] [address]   Re:  Kerri Vasquez (DOB: [birth year])   Dear [name]:   Thank you for the referral of your patient, Kerri Vasquez, for consultation regarding her solitary liver metastasis.   As you know, this pleasant 56 year old female was diagnosed with right breast carcinoma approximately five years ago and underwent bilateral mastectomy at that time.  On a recent abdominal CT scan to evaluate abdominal pain and a ventral hernia, a new liver lesion was identified, confirmed on ultrasound-guided FNA biopsy 11/27/11 to represent metastatic breast carcinoma.  PET CT showed no other metastatic disease.  A solitary hypermetabolic right thyroid lesion was incidentally noted.   Past medical history is significant for breast carcinoma as above, arthritis and back pain, depression, diet-controlled diabetes, hypertension, GE reflux post gastric bypass 2006, hyperthyroidism, anemia, previous cholecystectomy in 1987, and rheumatoid arthritis.   Current medications include folic acid 2 mg qday, levothyroxine 200 mcg a day, Coreg 12.5 mg bid, Prozac 40 mg qday, prednisone 50 mg qday, Percocet 5 mg q8h, Flexeril 10 mg qday, tizanadine 4 mg tid, and Phenergan 25 mg qday.   She has allergies to Band-Aid adhesive, flu vaccine and shingles vaccine.  No allergy to latex or iodinated contrast.   She is divorced and lives in [place].  She has one daughter who accompanied her to the consultation. She is retired from the [employer].  Positive caffeine use.   Negative tobacco, alcohol or drug use.   There is a family history of asthma, hypertension, thyroid disease, headaches, and cancer.   On review of systems, negative for myocardial infarction, stroke or bleeding diathesis.  Positive for fatigue, tinnitus, glasses use, abdominal pain, nausea and vomiting, back and joint pain, headaches, migraines, depression.   Page 2 Re:  Kerri Vasquez (DOB: [birth year])   On exam, she is of normal mood and affect. Above ideal body weight. She is afebrile.  Pulse 68, respirations 17, blood pressure 161/94, 265 lbs., 5'6".  Abdomen is obese and nontender.   Recent labs demonstrate a white blood cell count of 8.3, hemoglobin 12.3, hematocrit 37.1, platelets 232; creatinine 0.94, total bilirubin 0.6, alkaline phosphatase 92, AST 16, ALT 18, protein 6.1, albumin 3.5.  PT 13.3 on 11/27/11.   We spent the majority of the 45 minute consultation discussing the recently diagnosed solitary metastatic breast cancer lesion in her liver.  We discussed treatment options, including surgical resection and percutaneous ablation techniques.  We discussed, in particular, the microwave percutaneous ablation technique, anticipated benefits, possible risks and side effects, time course to resolution, and needs for follow-up.   My impression is that this solitary lesion, less than 3 cm, is in an ideal location for percutaneous microwave ablation.  We may instill a small amount of saline to prevent any collateral damage to the nearby hepatic flexure of the colon.  The lesion is peripheral enough to minimize the risk of central biliary damage or heat sink from central vessels.  The lesion is well away from the diaphragm such that I would  anticipate her to have relatively low post procedure pain.  We will plan to do this under a general anesthetic, probably using two probes, under both ultrasound and CT guidance to optimize lesion localization and treatment.  She  will go ahead and have her thyroid biopsied in the meantime.  She and her daughter seemed to understand and did ask appropriate questions.  They are motivated to proceed therefore; we can set this up at her convenience.   I appreciate your referral of this very pleasant patient.  I am optimistic we will be able to help her with her solitary liver metastasis from breast carcinoma.  I will keep you updated with her progress.  I have reviewed the findings and plan with Dr. Malachy Moan, who concurs.   Sincerely,   Oley Balm, MD [name] 12/21/11  Today's examination findings :   Past Medical History  Diagnosis Date  . Cancer 06/01/06    stage II breast cancer  . Depression   . Hypertension   . Migraine   . Hypothyroidism   . Incisional hernia   . Spinal stenosis   . Scoliosis   . Sciatica   . Intestinal obstruction   . Renal insufficiency   . Diabetes mellitus     type II, controlled by diet 05/25/11   . Sleep apnea     hx of off machine x 4 years   . GERD (gastroesophageal reflux disease)   . Septic arthritis 01/28/10    and spinal stenosis , moderate scoliosis   . Osteoarthritis of multiple joints   . Anemia   . Pernicious anemia 05/30/2011    Iron transfusion and VB12 on 06/06/11  . Trouble swallowing   . Leg swelling   . Abdominal pain   . Constipation   . Nausea & vomiting     Past Surgical History  Procedure Date  . Tonsillectomy 1969  . Cystectomy 2009    left breast  . Bariatric surgery 2006  . Other surgical history     surgery for intestinal blockage 2011   . Multiple extractions with alveoloplasty 06/08/2011    Procedure: MULTIPLE EXTRACION WITH ALVEOLOPLASTY;  Surgeon: Charlynne Pander, DDS;  Location: [facility];  Service: Oral Surgery;  Laterality: N/A;  Extraction of tooth #'s 3,4,7 with alveoloplasty and Gross Debridement of Remaining Teeth  . Breast surgery 2008     bilateral mastectomy  . Breast reconstruction 2008, 2009  . Cholecystectomy 06/29/85   . Gastric bypass 2006  . Intestinal blockage 2011    surgery for this     Family History  Problem Relation Age of Onset  . Cancer Mother     breast  . Hypertension Mother   . Anesthesia problems Daughter    Social History:  reports that she has never smoked. She has never used smokeless tobacco. She reports that she does not drink alcohol or use illicit drugs.  Allergies:  Allergies  Allergen Reactions  . Influenza Vac Split (Flu Virus Vaccine) Other (See Comments)    Joint stiffness and renal failure  . Zostavax (Zoster Vaccine Live ([employer])) Other (See Comments)    Joint stiffness, renal failure  . Adhesive (Tape) Itching and Rash    Medications Prior to Admission  Medication Sig Dispense Refill  . B Complex-C (B-COMPLEX WITH VITAMIN C) tablet Take 1 tablet by mouth daily.      . carvedilol (COREG) 12.5 MG tablet Take 1 tablet (12.5 mg total) by mouth 2 (two) times daily.  60 tablet  6  . Cholecalciferol (VITAMIN D) 2000 UNITS tablet Take 10,000 Units by mouth every 3 (three) days. Monday Thursday and sunday      . cyclobenzaprine (FLEXERIL) 5 MG tablet Take 1 tablet (5 mg total) by mouth 3 (three) times daily as needed. Muscle spasms  90 tablet  3  . FLUoxetine (PROZAC) 40 MG capsule Take 40 mg by mouth daily before breakfast.       . folic acid (FOLVITE) 1 MG tablet Take 2 mg by mouth daily.       Marland Kitchen levothyroxine (SYNTHROID, LEVOTHROID) 200 MCG tablet Take 1 tablet (200 mcg total) by mouth daily before breakfast.  30 tablet  1  . Multiple Vitamin (MULTIVITAMIN WITH MINERALS) TABS Take 1 tablet by mouth daily.      Marland Kitchen oxyCODONE-acetaminophen (PERCOCET) 5-325 MG per tablet Take 1 tablet by mouth 3 (three) times daily as needed. Pain      . predniSONE (DELTASONE) 5 MG tablet Take 15 mg by mouth daily. Takes 3 tablets      . promethazine (PHENERGAN) 25 MG tablet Take 1 tablet (25 mg total) by mouth every 6 (six) hours as needed. nausea  90 tablet  4  . SUMAtriptan (IMITREX)  50 MG tablet Take 50 mg by mouth every 2 (two) hours as needed. One tablet by mouth at start of migraine. May repeat in 2 hours once in 24 hrs as needed      . tiZANidine (ZANAFLEX) 4 MG tablet Take 1 tablet (4 mg total) by mouth 3 (three) times daily.  90 tablet  0  . HYDROcodone-acetaminophen (NORCO/VICODIN) 5-325 MG per tablet Take 1 tablet by mouth every 6 (six) hours as needed. pain  30 tablet  0    Results for orders placed during the hospital encounter of 01/26/12 (from the past 48 hour(s))  TYPE AND SCREEN     Status: Normal   Collection Time   01/26/12  7:20 AM      Component Value Range Comment   ABO/RH(D) O POS      Antibody Screen NEG      Sample Expiration 01/29/2012     GLUCOSE, CAPILLARY     Status: Normal   Collection Time   01/26/12  7:44 AM      Component Value Range Comment   Glucose-Capillary 79  70 - 99 mg/dL    Results for TAAHIRA, BIANCHINI ANN (MRN [id number]) as of 01/26/2012 08:57  Ref. Range 01/23/2012 10:06  Sodium Latest Range: 135-145 mEq/L 140  Potassium Latest Range: 3.5-5.3 mEq/L 4.3  Chloride Latest Range: 96-112 mEq/L 103  CO2 Latest Range: 19-32 mEq/L 31  BUN Latest Range: 6-23 mg/dL 17  Creatinine Latest Range: 0.50-1.10 mg/dL 4.09  Calcium Latest Range: 8.4-10.5 mg/dL 9.1  Glucose Latest Range: 70-99 mg/dL 85  Alkaline Phosphatase Latest Range: 39-117 U/L 108  Albumin Latest Range: 3.5-5.2 g/dL 3.7  AST Latest Range: 0-37 U/L 26  ALT Latest Range: 0-35 U/L 49 (H)  Total Protein Latest Range: 6.0-8.3 g/dL 6.2  Total Bilirubin Latest Range: 0.3-1.2 mg/dL 0.5  WBC Latest Range: 3.9-10.0 10e3/uL 7.1  RBC Latest Range: 3.87-5.11 MIL/uL 3.85  Hemoglobin Latest Range: 12.0-15.0 g/dL 81.1  HCT Latest Range: 36.0-46.0 % 37.7  MCV Latest Range: 78.0-100.0 fL 98  MCH Latest Range: 26.0-34.0 pg 32.5  MCHC Latest Range: 30.0-36.0 g/dL 91.4  RDW Latest Range: 11.5-15.5 % 12.2  Platelets Latest Range: 150-400 K/uL 256  NEUT% Latest Range: 39.6-80.0 % 59.8  LYMPH% Latest Range: 14.0-48.0 % 26.3  MONO% Latest Range: 0.0-13.0 % 9.9  EOS% Latest Range: 0.0-7.0 % 3.4  BASO% Latest Range: 0.0-2.0 % 0.6  BASO# Latest Range: 0.0-0.2 10e3/uL 0.0  NEUT# Latest Range: 1.7-7.7 K/uL 4.2  MONO# Latest Range: 0.1-0.9 10e3/uL 0.7  Eosinophils Absolute Latest Range: 0.0-0.7 K/uL 0.2  lymph# Latest Range: 0.9-3.3 10e3/uL 1.9  CA 27.29 Latest Range: 0-39 U/mL 30    Review of Systems  Constitutional: Positive for malaise/fatigue. Negative for fever and chills.  HENT: Positive for tinnitus.   Respiratory: Negative for cough, hemoptysis, sputum production and shortness of breath.   Cardiovascular: Positive for leg swelling. Negative for chest pain and palpitations.  Gastrointestinal: Positive for heartburn, nausea and abdominal pain. Negative for vomiting.  Musculoskeletal: Positive for back pain and joint pain. Negative for falls.  Neurological: Negative for dizziness, focal weakness, seizures and loss of consciousness.  Endo/Heme/Allergies: Negative.   Psychiatric/Behavioral: Positive for depression. Negative for memory loss and substance abuse. The patient is not nervous/anxious and does not have insomnia.     Blood pressure 184/102, pulse 68, temperature 97.4 F (36.3 C), resp. rate 20, last menstrual period 08/30/2006, SpO2 98.00%. Physical Exam  Constitutional: She is oriented to person, place, and time. She appears well-developed and well-nourished. No distress.       Morbidly obese   HENT:  Head: Normocephalic and atraumatic.  Eyes: Pupils are equal, round, and reactive to light.  Cardiovascular: Normal rate, regular rhythm and normal heart sounds.  Exam reveals no gallop and no friction rub.   No murmur heard. Respiratory: Effort normal and breath sounds normal. No respiratory distress. She has no wheezes. She has no rales.  GI: Soft. Bowel sounds are normal. She exhibits no distension. There is no tenderness. There is no guarding.   Musculoskeletal: Normal range of motion. She exhibits edema.       RUE with lymphedema.  RLE with edema at knee - severe OA in need of knee replacement   Neurological: She is alert and oriented to person, place, and time.  Skin: Skin is warm and dry. No rash noted.  Psychiatric: She has a normal mood and affect. Her behavior is normal. Judgment and thought content normal.     Assessment/Plan Patient met in consultation for microwave ablation of this solitary hepatic lesion which was proven metastatic breast lesion via needle biopsy. Procedure details, benefits and risks reviewed with patient and daughter today who have no further questions. Written consent obtained. Plan for observation admission post procedure.   CAMPBELL,PAMELA D 01/26/2012, 8:53 AM

## 2012-01-26 NOTE — Progress Notes (Signed)
Day of Surgery  Subjective: Doing well. Denies nausea.  Pain controlled with PCA. Wanting to eat.   Objective: Vital signs in last 24 hours: Temp:  [97.4 F (36.3 C)-98.5 F (36.9 C)] 97.9 F (36.6 C) (09/27 1425) Pulse Rate:  [65-77] 72  (09/27 1425) Resp:  [8-20] 16  (09/27 1425) BP: (154-186)/(78-114) 184/93 mmHg (09/27 1425) SpO2:  [97 %-100 %] 97 % (09/27 1425)   Intake/Output this shift: Total I/O In: 950 [I.V.:950] Out: 1100 [Urine:1100]  PE:  Awake, alert. Daughter present.  Treatment site with clean bandage intact - minimally tender. No evidence of hematoma.  Abdomen soft, positive bowel sounds. Lungs CTA bilaterally.   Studies/Results: Ct Guide Tissue Ablation  01/26/2012  *RADIOLOGY REPORT*  IR CT GUIDED TISSUE ABLATION  Date: 01/26/2012  Clinical History: 56 year old female with a history of right breast cancer diagnosed 5 years ago.  She is status post bilateral mastectomy.  On the recent CT scan, she was found of an isolated hepatic metastasis.  She presents today for percutaneous ablation of her hepatic metastatic disease.  Procedures Performed: 1. CT guided Casimiro Needle of ablation,  Interventional Radiologists: D. Oley Balm, MD; [name] Big, MD  Sedation: General anesthesia was provided by the anesthesiology service  Contrast volume: A very small amount of Omnipaque-300 was injected into the peritoneal space.  PROCEDURE/FINDINGS:   Informed consent was obtained from the patient following explanation of the procedure, risks, benefits and alternatives. The patient understands, agrees and consents for the procedure. All questions were addressed. A time out was performed.  Maximal barrier sterile technique utilized including caps, mask, sterile gowns, sterile gloves, large sterile drape, hand hygiene, and betadine skin prep.  A planning triple phase contrast CT scan was performed to formally assess for additional metastatic disease.  The primary lesion in hepatic segments  4A/4B is again identified.  The lesion measures 2.7 x 2.1 x 2.1 cm which is minimally changed from the prior. No new metastatic disease is identified.  The patient is status post prior cholecystectomy.  The visualized spleen, pancreas, kidneys and adrenal glands are unremarkable.  The patient is status post surgical changes of gastric bypass surgery.  There is a small midline abdominal ventral hernia containing colon and small bowel. The hepatic flexure is not fairly close approximation with the undersurface of hepatic segment 4B.  A 15 cm 20 gauge Chiba needle was then advanced into the mesenteric fat in the sub hepatic space just cephalad to the hepatic flexure. This was performed using a combination of ultrasound, and CT fluoroscopic guidance.  A dilute contrast solution of 1000 ml normal saline and 15 ml Omnipaque-300 was then injected through the needle in standard hydro dissection technique.  A total of 420 ml of this dilute contrast solution was injected.  A follow-up axial CT imaging demonstrated excellent hydro dissection of the hepatic flexure away from the ablation target.  Using CT fluoroscopic technique, two 15 cm 3.7 cm active tip Covidien microwave antennae were then advanced percutaneously into the lesion.  The antennae were oriented parallel and separated by approximately 1.5 cm in the craniocaudal dimension.  Following axial CT confirmation of appropriate antennae placement, thermal ablation was conducted with both probes at 45 watts for 10 minutes.  Following the successful ablation protocol, the tracts were ablated as the antennae were removed.  Sterile bandage was applied.  Post ablation CT imaging demonstrated expected high attenuation changes in the ablations on at the site of the prior lesion.  There is  trace hematoma in the right upper quadrant body wall and no evidence of significant intraperitoneal or perihepatic hemorrhage. The patient tolerated the procedure well.  There is no immediate  complication.  She was taken to the PACU where she was extubated.  IMPRESSION:  Technically successful CT guided micro wave ablation of solitary hepatic metastasis.  Signed,  [name] Big, MD & D. Oley Balm, MD Vascular & Interventional Radiology [facility] Radiology   Original Report Authenticated By: Vilma Prader     Anti-infectives: Anti-infectives     Start     Dose/Rate Route Frequency Ordered Stop   01/26/12 0900   ciprofloxacin (CIPRO) IVPB 400 mg        400 mg 200 mL/hr over 60 Minutes Intravenous  Once 01/26/12 0857     01/26/12 0715   ceFAZolin (ANCEF) IVPB 2 g/50 mL premix  Status:  Discontinued        2 g 100 mL/hr over 30 Minutes Intravenous  Once 01/26/12 0714 01/26/12 0722   01/26/12 0714   ceFAZolin (ANCEF) 3 g in dextrose 5 % 50 mL IVPB        3 g 160 mL/hr over 30 Minutes Intravenous 60 min pre-op 01/26/12 0714 01/26/12 0945          Assessment/Plan: s/p Procedure(s) (LRB) with comments: RADIO FREQUENCY ABLATION (N/A) of solitary hepatic lesion.  Anticipate discharge in am.      LOS: 0 days    CAMPBELL,PAMELA D 01/26/2012

## 2012-01-27 DIAGNOSIS — K7689 Other specified diseases of liver: Secondary | ICD-10-CM | POA: Diagnosis not present

## 2012-01-27 LAB — COMPREHENSIVE METABOLIC PANEL
Albumin: 2.8 g/dL — ABNORMAL LOW (ref 3.5–5.2)
Alkaline Phosphatase: 189 U/L — ABNORMAL HIGH (ref 39–117)
BUN: 16 mg/dL (ref 6–23)
Creatinine, Ser: 0.87 mg/dL (ref 0.50–1.10)
GFR calc Af Amer: 84 mL/min — ABNORMAL LOW (ref >90)
Glucose, Bld: 142 mg/dL — ABNORMAL HIGH (ref 70–99)
Potassium: 5.7 mEq/L — ABNORMAL HIGH (ref 3.5–5.1)
Total Protein: 6 g/dL (ref 6.0–8.3)

## 2012-01-27 LAB — CBC
HCT: 35.9 % — ABNORMAL LOW (ref 36.0–46.0)
Hemoglobin: 12.3 g/dL (ref 12.0–15.0)
MCH: 32.1 pg (ref 26.0–34.0)
MCHC: 34.3 g/dL (ref 30.0–36.0)
MCV: 93.7 fL (ref 78.0–100.0)
RDW: 12.5 % (ref 11.5–15.5)

## 2012-01-27 MED ORDER — OXYCODONE-ACETAMINOPHEN 5-325 MG PO TABS
2.0000 | ORAL_TABLET | ORAL | Status: DC | PRN
  Administered 2012-01-27: 2 via ORAL
  Filled 2012-01-27: qty 2

## 2012-01-27 MED ORDER — PNEUMOCOCCAL VAC POLYVALENT 25 MCG/0.5ML IJ INJ: 0.5000 mL | INJECTION | INTRAMUSCULAR | Status: DC

## 2012-01-27 NOTE — Discharge Summary (Signed)
Physician Discharge Summary  Patient ID: Kerri Vasquez: [id number] DOB/AGE: 57-Jan-1957 57 y.o.  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   Discharge Diagnoses:  Principal Problem:  *Hepatic lesion Active Problems:  Adenocarcinoma, breast  Degenerative joint disease  Elevated LFTs  GERD (gastroesophageal reflux disease)  Anemia   Discharged Condition: stable  Hospital Course: patient admitted for observation admission post microwave ablation of solitary hepatic lesion under general anesthesia for pain control and hemodynamic monitoring.  Patient extubated without difficulty and had minimal post operative nausea.  She remained on a PCA pump overnight for pain control. She remained neurologically and hemodynamically stable throughout the night with no immediate adverse events.  She was tolerating a regular diet and po pain meds with good pain control prior to discharge.  Follow up in clinic in 2-4 weeks with imaging to be performed at a later today to evaluate this lesion post treatment.  All her medications were reviewed and resumed upon discharge.  She is to keep her follow up appointment with Dr. [name] for 02/27/12. Post procedure care reviewed with patient with her apparent understanding.   Consults: none  Significant Diagnostic Studies: Results for Kerri, Vasquez Kerri Vasquez (Vasquez [id number]) as of 01/27/2012 09:43  Ref. Range 01/27/2012 06:20  Sodium Latest Range: 135-145 mEq/L 136  Potassium Latest Range: 3.5-5.1 mEq/L 5.7 (H)  Chloride Latest Range: 96-112 mEq/L 105  CO2 Latest Range: 19-32 mEq/L 22  BUN Latest Range: 6-23 mg/dL 16  Creatinine Latest Range: 0.50-1.10 mg/dL 4.74  Calcium Latest Range: 8.4-10.5 mg/dL 8.7  GFR calc non Af Amer Latest Range: >90 mL/min 73 (L)  GFR calc Af Amer Latest Range: >90 mL/min 84 (L)  Glucose Latest Range: 70-99 mg/dL 259 (H)  Alkaline Phosphatase  Latest Range: 39-117 U/L 189 (H)  Albumin Latest Range: 3.5-5.2 g/dL 2.8 (L)  AST Latest Range: 0-37 U/L 294 (H)  ALT Latest Range: 0-35 U/L 215 (H)  Total Protein Latest Range: 6.0-8.3 g/dL 6.0  Total Bilirubin Latest Range: 0.3-1.2 mg/dL 0.5  WBC Latest Range: 3.9-10.0 10e3/uL 8.7  RBC Latest Range: 3.87-5.11 MIL/uL 3.83 (L)  Hemoglobin Latest Range: 12.0-15.0 g/dL 56.3  HCT Latest Range: 36.0-46.0 % 35.9 (L)  MCV Latest Range: 78.0-100.0 fL 93.7  MCH Latest Range: 26.0-34.0 pg 32.1  MCHC Latest Range: 30.0-36.0 g/dL 87.5  RDW Latest Range: 11.5-15.5 % 12.5  Platelets Latest Range: 150-400 K/uL 183   Treatments:  *RADIOLOGY REPORT*   IR CT GUIDED TISSUE ABLATION   Date: 01/26/2012   Clinical History: 56 year old female with a history of right breast cancer diagnosed 5 years ago.  She is status post bilateral mastectomy.  On the recent CT scan, she was found of an isolated hepatic metastasis.  She presents today for percutaneous ablation of her hepatic metastatic disease.   Procedures Performed: 1. CT guided microwave ablation,   Interventional Radiologists: D. Oley Balm, MD; [name] Big, MD   Sedation: General anesthesia was provided by the anesthesiology service   Contrast volume: A very small amount of Omnipaque-300 was injected into the peritoneal space.   PROCEDURE/FINDINGS:    Informed consent was obtained from the patient following explanation of the procedure, risks, benefits and alternatives. The patient understands, agrees and consents for the procedure. All questions were addressed. A time out was performed.   Maximal barrier sterile technique utilized including caps, mask, sterile gowns, sterile gloves, large sterile drape, hand hygiene, and betadine skin prep.  A planning triple phase contrast CT scan was performed to formally assess for additional metastatic disease.  The primary lesion in hepatic segments 4A/4B is again identified.   The lesion measures 2.7 x 2.1 x 2.1 cm which is minimally changed from the prior. No new metastatic disease is identified.  The patient is status post prior cholecystectomy.  The visualized spleen, pancreas, kidneys and adrenal glands are unremarkable.  The patient is status post surgical changes of gastric bypass surgery.  There is a small midline abdominal ventral hernia containing colon and small bowel. The hepatic flexure is not fairly close approximation with the undersurface of hepatic segment 4B.   A 15 cm 20 gauge Chiba needle was then advanced into the mesenteric fat in the sub hepatic space just cephalad to the hepatic flexure. This was performed using a combination of ultrasound, and CT fluoroscopic guidance.  A dilute contrast solution of 1000 ml normal saline and 15 ml Omnipaque-300 was then injected through the needle in standard hydro dissection technique.  A total of 420 ml of this dilute contrast solution was injected.  A follow-up axial CT imaging demonstrated excellent hydro dissection of the hepatic flexure away from the ablation target.  Using CT fluoroscopic technique, two 15 cm 3.7 cm active tip Covidien microwave antennae were then advanced percutaneously into the lesion.  The antennae were oriented parallel and separated by approximately 1.5 cm in the craniocaudal dimension.  Following axial CT confirmation of appropriate antennae placement, thermal ablation was conducted with both probes at 45 watts for 10 minutes.  Following the successful ablation protocol, the tracts were ablated as the antennae were removed.  Sterile bandage was applied.   Post ablation CT imaging demonstrated expected high attenuation changes in the ablations on at the site of the prior lesion.  There is trace hematoma in the right upper quadrant body wall and no evidence of significant intraperitoneal or perihepatic hemorrhage. The patient tolerated the procedure well.  There is no  immediate complication.  She was taken to the PACU where she was extubated.   IMPRESSION:   Technically successful CT guided micro wave ablation of solitary hepatic metastasis.   Signed,   [name] Big, MD & D. Oley Balm, MD Vascular & Interventional Radiology [facility]      Discharge Exam: Blood pressure 148/69, pulse 89, temperature 97.8 F (36.6 C), temperature source Oral, resp. rate 18, last menstrual period 08/30/2006, SpO2 100.00%.  Physical exam :  Awake, alert and oriented.  Pain controlled.  Denies nausea or vomiting and tolerating regular diet. CV: RRR Resp: CTA bilaterally Abd: RUQ treatment site with small incision without pain, erythema, hematoma or drainage. Minimally tender to palpation.  Abdomen is obese, non-tender and with positive bowel sounds throughout. Bandage changed out for bandaid  Ext: stable edema of RUE and LLE at the knee.   Disposition: 01-Home or Self Care  Discharge Orders    Future Appointments: Provider: Department: Dept Phone: Center:   02/27/2012 10:00 AM Rachael Fee [facility] [phone] None   02/27/2012 10:30 AM Josph Macho, MD [facility] [phone] None   02/27/2012 11:15 AM [facility] Nurse [facility] [phone] None   03/12/2012 9:30 AM [name], MD [facility] [phone] None     Future Orders Please Complete By Expires   IR Radiologist Eval & Mgmt   03/28/13   Questions: Responses:   Is the patient pregnant? No   Preferred Imaging Location? [facility]  Reason for exam: follow up hepatic microwave ablation 92713       Medication List     As of 01/27/2012  9:40 AM    TAKE these medications         B-complex with vitamin C tablet   Take 1 tablet by mouth daily.      carvedilol 12.5 MG tablet   Commonly known as: COREG   Take 1 tablet (12.5 mg total) by mouth 2 (two) times daily.      cyclobenzaprine 5 MG tablet   Commonly known as:  FLEXERIL   Take 1 tablet (5 mg total) by mouth 3 (three) times daily as needed. Muscle spasms      FLUoxetine 40 MG capsule   Commonly known as: PROZAC   Take 40 mg by mouth daily before breakfast.      folic acid 1 MG tablet   Commonly known as: FOLVITE   Take 2 mg by mouth daily.      HYDROcodone-acetaminophen 5-325 MG per tablet   Commonly known as: NORCO/VICODIN   Take 1 tablet by mouth every 6 (six) hours as needed. pain      levothyroxine 200 MCG tablet   Commonly known as: SYNTHROID, LEVOTHROID   Take 1 tablet (200 mcg total) by mouth daily before breakfast.      multivitamin with minerals Tabs   Take 1 tablet by mouth daily.      PERCOCET 5-325 MG per tablet   Generic drug: oxyCODONE-acetaminophen   Take 1 tablet by mouth 3 (three) times daily as needed. Pain      predniSONE 5 MG tablet   Commonly known as: DELTASONE   Take 15 mg by mouth daily. Takes 3 tablets      promethazine 25 MG tablet   Commonly known as: PHENERGAN   Take 1 tablet (25 mg total) by mouth every 6 (six) hours as needed. nausea      SUMAtriptan 50 MG tablet   Commonly known as: IMITREX   Take 50 mg by mouth every 2 (two) hours as needed. One tablet by mouth at start of migraine. May repeat in 2 hours once in 24 hrs as needed      tiZANidine 4 MG tablet   Commonly known as: ZANAFLEX   Take 1 tablet (4 mg total) by mouth 3 (three) times daily.      Vitamin D 2000 UNITS tablet   Take 10,000 Units by mouth every 3 (three) days. Monday Thursday and sunday           Follow-up Information    Follow up with HASSELL Angelique Blonder, MD. (Clinic to call you with appointment date and time)    Contact information:   [address] [place] [address] [phone]          Signed: CAMPBELL,PAMELA D 01/27/2012, 9:40 AM

## 2012-01-28 MED FILL — Cefazolin Sodium for IV Soln 2 GM and Dextrose 3% (50 ML): INTRAVENOUS | Qty: 50 | Status: AC

## 2012-02-08 NOTE — Progress Notes (Signed)
Need MD order entry in Epic. W. Antionette Luster,RN 

## 2012-02-09 ENCOUNTER — Other Ambulatory Visit (INDEPENDENT_AMBULATORY_CARE_PROVIDER_SITE_OTHER): Payer: Self-pay | Admitting: Surgery

## 2012-02-12 ENCOUNTER — Encounter (HOSPITAL_COMMUNITY): Payer: Self-pay | Admitting: Pharmacy Technician

## 2012-02-12 ENCOUNTER — Ambulatory Visit (HOSPITAL_COMMUNITY)
Admission: RE | Admit: 2012-02-12 | Discharge: 2012-02-12 | Disposition: A | Discharge status: Home / Self Care | Payer: Medicare Other | Source: Ambulatory Visit | Attending: Surgery | Admitting: Surgery

## 2012-02-12 ENCOUNTER — Encounter (HOSPITAL_COMMUNITY)
Admission: RE | Admit: 2012-02-12 | Discharge: 2012-02-12 | Disposition: A | Discharge status: Home / Self Care | Payer: Medicare Other | Source: Ambulatory Visit | Attending: Surgery | Admitting: Surgery

## 2012-02-12 ENCOUNTER — Encounter (HOSPITAL_COMMUNITY): Payer: Self-pay

## 2012-02-12 DIAGNOSIS — Z853 Personal history of malignant neoplasm of breast: Secondary | ICD-10-CM | POA: Diagnosis not present

## 2012-02-12 DIAGNOSIS — Z01812 Encounter for preprocedural laboratory examination: Secondary | ICD-10-CM | POA: Diagnosis not present

## 2012-02-12 DIAGNOSIS — Z01818 Encounter for other preprocedural examination: Secondary | ICD-10-CM | POA: Insufficient documentation

## 2012-02-12 DIAGNOSIS — I10 Essential (primary) hypertension: Secondary | ICD-10-CM | POA: Diagnosis not present

## 2012-02-12 HISTORY — DX: Fibromyalgia: M79.7

## 2012-02-12 LAB — CBC
HCT: 36.7 % (ref 36.0–46.0)
Hemoglobin: 12.2 g/dL (ref 12.0–15.0)
MCHC: 33.2 g/dL (ref 30.0–36.0)
WBC: 6.8 10*3/uL (ref 4.0–10.5)

## 2012-02-12 LAB — BASIC METABOLIC PANEL
BUN: 16 mg/dL (ref 6–23)
CO2: 24 mEq/L (ref 19–32)
Chloride: 107 mEq/L (ref 96–112)
Glucose, Bld: 90 mg/dL (ref 70–99)
Potassium: 4.6 mEq/L (ref 3.5–5.1)

## 2012-02-12 LAB — SURGICAL PCR SCREEN: Staphylococcus aureus: NEGATIVE

## 2012-02-12 NOTE — Progress Notes (Signed)
Quick Note:  These results are acceptable for scheduled surgery.  Langston Summerfield M. Lacie Landry, MD, FACS [facility] Office: [phone]   ______ 

## 2012-02-12 NOTE — Progress Notes (Signed)
ekg 05-25-2011 epic

## 2012-02-12 NOTE — Patient Instructions (Signed)
Kerri Vasquez  02/12/2012   Your procedure is scheduled on:  02-16-2012  Report to [facility] at  0800 AM.  Call this number if you have problems the morning of surgery: [phone]   Remember:   Do not eat food or drink liquids:After Midnight.  .  Take these medicines the morning of surgery with A SIP OF WATER: coreg, prozac, synthroid, percocet if needed, prednisone   Do not wear jewelry or make up.  Do not wear lotions, powders, or perfumes. You may wear deodorant.    Do not bring valuables to the hospital.  Contacts, dentures or bridgework may not be worn into surgery.  Leave suitcase in the car. After surgery it may be brought to your room.  For patients admitted to the hospital, checkout time is 11:00 AM the day of discharge                             Patients discharged the day of surgery will not be allowed to drive home. If going home same day of surgery, you must have someone stay with you the first 24 hours at home and arrange for some one to drive you home from hospital.    Special Instructions: See [facility] Preparing for Surgery instruction sheet. Women do not shave legs or underarms for 12 hours before showers. Men may shave face morning of surgery.    Please read over the following fact sheets that you were given: MRSA Information  C[facility] pre op nurse phone number [phone], call if needed

## 2012-02-15 ENCOUNTER — Other Ambulatory Visit: Payer: Self-pay | Admitting: Interventional Radiology

## 2012-02-15 DIAGNOSIS — K769 Liver disease, unspecified: Secondary | ICD-10-CM

## 2012-02-15 DIAGNOSIS — C50919 Malignant neoplasm of unspecified site of unspecified female breast: Secondary | ICD-10-CM

## 2012-02-16 ENCOUNTER — Ambulatory Visit (HOSPITAL_COMMUNITY): Payer: Medicare Other | Admitting: Anesthesiology

## 2012-02-16 ENCOUNTER — Encounter (HOSPITAL_COMMUNITY): Payer: Self-pay | Admitting: Anesthesiology

## 2012-02-16 ENCOUNTER — Encounter (HOSPITAL_COMMUNITY): Payer: Self-pay

## 2012-02-16 ENCOUNTER — Encounter (HOSPITAL_COMMUNITY)
Admission: RE | Disposition: A | Discharge status: Home / Self Care | Payer: Self-pay | Source: Ambulatory Visit | Attending: Surgery

## 2012-02-16 ENCOUNTER — Observation Stay (HOSPITAL_COMMUNITY)
Admission: RE | Admit: 2012-02-16 | Discharge: 2012-02-17 | Disposition: A | Discharge status: Home / Self Care | Payer: Medicare Other | Source: Ambulatory Visit | Attending: Surgery | Admitting: Surgery

## 2012-02-16 ENCOUNTER — Encounter (HOSPITAL_COMMUNITY): Payer: Self-pay | Admitting: Orthopedic Surgery

## 2012-02-16 DIAGNOSIS — D34 Benign neoplasm of thyroid gland: Secondary | ICD-10-CM | POA: Diagnosis not present

## 2012-02-16 DIAGNOSIS — E0789 Other specified disorders of thyroid: Secondary | ICD-10-CM | POA: Diagnosis not present

## 2012-02-16 DIAGNOSIS — E039 Hypothyroidism, unspecified: Secondary | ICD-10-CM | POA: Insufficient documentation

## 2012-02-16 DIAGNOSIS — Z79899 Other long term (current) drug therapy: Secondary | ICD-10-CM | POA: Diagnosis not present

## 2012-02-16 DIAGNOSIS — D449 Neoplasm of uncertain behavior of unspecified endocrine gland: Principal | ICD-10-CM | POA: Insufficient documentation

## 2012-02-16 DIAGNOSIS — Z853 Personal history of malignant neoplasm of breast: Secondary | ICD-10-CM | POA: Diagnosis not present

## 2012-02-16 DIAGNOSIS — I10 Essential (primary) hypertension: Secondary | ICD-10-CM | POA: Insufficient documentation

## 2012-02-16 DIAGNOSIS — D440 Neoplasm of uncertain behavior of thyroid gland: Secondary | ICD-10-CM

## 2012-02-16 DIAGNOSIS — G4730 Sleep apnea, unspecified: Secondary | ICD-10-CM | POA: Insufficient documentation

## 2012-02-16 DIAGNOSIS — E119 Type 2 diabetes mellitus without complications: Secondary | ICD-10-CM | POA: Diagnosis not present

## 2012-02-16 DIAGNOSIS — K219 Gastro-esophageal reflux disease without esophagitis: Secondary | ICD-10-CM | POA: Diagnosis not present

## 2012-02-16 HISTORY — PX: THYROID LOBECTOMY: SHX420

## 2012-02-16 LAB — GLUCOSE, CAPILLARY

## 2012-02-16 SURGERY — LOBECTOMY, THYROID
Anesthesia: General | Site: Neck | Laterality: Right | Wound class: Clean

## 2012-02-16 MED ORDER — LACTATED RINGERS IV SOLN
INTRAVENOUS | Status: DC
  Administered 2012-02-16: 1000 mL via INTRAVENOUS

## 2012-02-16 MED ORDER — CEFAZOLIN SODIUM-DEXTROSE 2-3 GM-% IV SOLR
INTRAVENOUS | Status: AC
  Filled 2012-02-16: qty 50

## 2012-02-16 MED ORDER — OXYCODONE-ACETAMINOPHEN 10-325 MG PO TABS: 1.0000 | ORAL_TABLET | Freq: Four times a day (QID) | ORAL | Status: DC | PRN

## 2012-02-16 MED ORDER — PROMETHAZINE HCL 25 MG PO TABS: 25.0000 mg | ORAL_TABLET | Freq: Four times a day (QID) | ORAL | Status: DC | PRN

## 2012-02-16 MED ORDER — LIDOCAINE HCL (CARDIAC) 20 MG/ML IV SOLN
INTRAVENOUS | Status: DC | PRN
  Administered 2012-02-16: 50 mg via INTRAVENOUS

## 2012-02-16 MED ORDER — DEXAMETHASONE SODIUM PHOSPHATE 10 MG/ML IJ SOLN
INTRAMUSCULAR | Status: DC | PRN
  Administered 2012-02-16: 4 mg via INTRAVENOUS

## 2012-02-16 MED ORDER — LACTATED RINGERS IV SOLN: INTRAVENOUS | Status: DC | PRN

## 2012-02-16 MED ORDER — FENTANYL CITRATE 0.05 MG/ML IJ SOLN
INTRAMUSCULAR | Status: DC | PRN
Start: 2012-02-16 — End: 2012-02-16
  Administered 2012-02-16: 100 ug via INTRAVENOUS
  Administered 2012-02-16 (×2): 50 ug via INTRAVENOUS
  Administered 2012-02-16: 100 ug via INTRAVENOUS
  Administered 2012-02-16: 50 ug via INTRAVENOUS
  Administered 2012-02-16: 100 ug via INTRAVENOUS
  Administered 2012-02-16: 50 ug via INTRAVENOUS

## 2012-02-16 MED ORDER — LACTATED RINGERS IV SOLN
INTRAVENOUS | Status: DC | PRN
  Administered 2012-02-16 (×2): via INTRAVENOUS

## 2012-02-16 MED ORDER — PROPOFOL 10 MG/ML IV BOLUS
INTRAVENOUS | Status: DC | PRN
  Administered 2012-02-16: 200 mg via INTRAVENOUS

## 2012-02-16 MED ORDER — ONDANSETRON HCL 4 MG PO TABS
4.0000 mg | ORAL_TABLET | Freq: Four times a day (QID) | ORAL | Status: DC | PRN
  Filled 2012-02-16: qty 1

## 2012-02-16 MED ORDER — HYDROMORPHONE HCL PF 1 MG/ML IJ SOLN
0.2500 mg | INTRAMUSCULAR | Status: DC | PRN
  Administered 2012-02-16 (×6): 0.5 mg via INTRAVENOUS

## 2012-02-16 MED ORDER — LABETALOL HCL 5 MG/ML IV SOLN
INTRAVENOUS | Status: DC | PRN
  Administered 2012-02-16 (×2): 5 mg via INTRAVENOUS

## 2012-02-16 MED ORDER — CEFAZOLIN SODIUM 1-5 GM-% IV SOLN
INTRAVENOUS | Status: AC
  Filled 2012-02-16: qty 50

## 2012-02-16 MED ORDER — 0.9 % SODIUM CHLORIDE (POUR BTL) OPTIME
TOPICAL | Status: DC | PRN
  Administered 2012-02-16: 1000 mL

## 2012-02-16 MED ORDER — ROCURONIUM BROMIDE 100 MG/10ML IV SOLN
INTRAVENOUS | Status: DC | PRN
  Administered 2012-02-16: 20 mg via INTRAVENOUS
  Administered 2012-02-16 (×2): 10 mg via INTRAVENOUS
  Administered 2012-02-16: 5 mg via INTRAVENOUS
  Administered 2012-02-16: 20 mg via INTRAVENOUS

## 2012-02-16 MED ORDER — SUCCINYLCHOLINE CHLORIDE 20 MG/ML IJ SOLN
INTRAMUSCULAR | Status: DC | PRN
  Administered 2012-02-16: 200 mg via INTRAVENOUS

## 2012-02-16 MED ORDER — FLUOXETINE HCL 20 MG PO CAPS
40.0000 mg | ORAL_CAPSULE | Freq: Every day | ORAL | Status: DC
Start: 2012-02-16 — End: 2012-02-17
  Administered 2012-02-16: 40 mg via ORAL
  Filled 2012-02-16 (×2): qty 2

## 2012-02-16 MED ORDER — HYDROMORPHONE HCL PF 1 MG/ML IJ SOLN
1.0000 mg | INTRAMUSCULAR | Status: DC | PRN
  Administered 2012-02-16 – 2012-02-17 (×2): 1 mg via INTRAVENOUS
  Filled 2012-02-16 (×2): qty 1

## 2012-02-16 MED ORDER — PROMETHAZINE HCL 25 MG/ML IJ SOLN: 6.2500 mg | INTRAMUSCULAR | Status: DC | PRN

## 2012-02-16 MED ORDER — PREDNISONE 5 MG PO TABS
15.0000 mg | ORAL_TABLET | Freq: Every morning | ORAL | Status: DC
  Administered 2012-02-16: 5 mg via ORAL
  Filled 2012-02-16 (×2): qty 1

## 2012-02-16 MED ORDER — ACETAMINOPHEN 325 MG PO TABS: 650.0000 mg | ORAL_TABLET | ORAL | Status: DC | PRN

## 2012-02-16 MED ORDER — ONDANSETRON HCL 4 MG/2ML IJ SOLN
4.0000 mg | Freq: Four times a day (QID) | INTRAMUSCULAR | Status: DC | PRN
  Administered 2012-02-16: 4 mg via INTRAVENOUS

## 2012-02-16 MED ORDER — LACTATED RINGERS IV SOLN: INTRAVENOUS | Status: DC

## 2012-02-16 MED ORDER — HYDROMORPHONE HCL PF 1 MG/ML IJ SOLN
INTRAMUSCULAR | Status: AC
  Administered 2012-02-16: 1 mg via INTRAVENOUS
  Filled 2012-02-16: qty 1

## 2012-02-16 MED ORDER — HYDROMORPHONE HCL PF 1 MG/ML IJ SOLN
INTRAMUSCULAR | Status: AC
  Filled 2012-02-16: qty 1

## 2012-02-16 MED ORDER — HYDROCORTISONE SOD SUCCINATE 100 MG IJ SOLR
INTRAMUSCULAR | Status: DC | PRN
  Administered 2012-02-16: 100 mg via INTRAVENOUS

## 2012-02-16 MED ORDER — NEOSTIGMINE METHYLSULFATE 1 MG/ML IJ SOLN
INTRAMUSCULAR | Status: DC | PRN
  Administered 2012-02-16: 4 mg via INTRAVENOUS

## 2012-02-16 MED ORDER — ONDANSETRON HCL 4 MG/2ML IJ SOLN
INTRAMUSCULAR | Status: AC
  Filled 2012-02-16: qty 2

## 2012-02-16 MED ORDER — HYDROCORTISONE SOD SUCCINATE 100 MG IJ SOLR
INTRAMUSCULAR | Status: AC
  Filled 2012-02-16: qty 2

## 2012-02-16 MED ORDER — LEVOTHYROXINE SODIUM 200 MCG PO TABS
200.0000 ug | ORAL_TABLET | Freq: Every day | ORAL | Status: DC
  Filled 2012-02-16 (×2): qty 1

## 2012-02-16 MED ORDER — OXYCODONE-ACETAMINOPHEN 5-325 MG PO TABS
1.0000 | ORAL_TABLET | Freq: Four times a day (QID) | ORAL | Status: DC | PRN
  Administered 2012-02-16 – 2012-02-17 (×2): 1 via ORAL
  Filled 2012-02-16 (×2): qty 1

## 2012-02-16 MED ORDER — TIZANIDINE HCL 4 MG PO TABS
4.0000 mg | ORAL_TABLET | Freq: Three times a day (TID) | ORAL | Status: DC
Start: 2012-02-16 — End: 2012-02-17
  Administered 2012-02-16 (×2): 4 mg via ORAL
  Filled 2012-02-16 (×5): qty 1

## 2012-02-16 MED ORDER — GLYCOPYRROLATE 0.2 MG/ML IJ SOLN
INTRAMUSCULAR | Status: DC | PRN
  Administered 2012-02-16: .6 mg via INTRAVENOUS

## 2012-02-16 MED ORDER — OXYCODONE HCL 5 MG PO TABS
5.0000 mg | ORAL_TABLET | Freq: Four times a day (QID) | ORAL | Status: DC | PRN
  Administered 2012-02-16 – 2012-02-17 (×3): 5 mg via ORAL
  Filled 2012-02-16 (×3): qty 1

## 2012-02-16 MED ORDER — DEXTROSE 5 % IV SOLN
3.0000 g | INTRAVENOUS | Status: AC
  Administered 2012-02-16: 3 g via INTRAVENOUS
  Filled 2012-02-16: qty 3000

## 2012-02-16 MED ORDER — KCL IN DEXTROSE-NACL 20-5-0.45 MEQ/L-%-% IV SOLN
INTRAVENOUS | Status: DC
  Administered 2012-02-16: 75 mL/h via INTRAVENOUS
  Administered 2012-02-17: 05:00:00 via INTRAVENOUS
  Filled 2012-02-16 (×4): qty 1000

## 2012-02-16 MED ORDER — CARVEDILOL 12.5 MG PO TABS
12.5000 mg | ORAL_TABLET | Freq: Two times a day (BID) | ORAL | Status: DC
  Administered 2012-02-16 – 2012-02-17 (×2): 12.5 mg via ORAL
  Filled 2012-02-16 (×4): qty 1

## 2012-02-16 SURGICAL SUPPLY — 40 items
ATTRACTOMAT 16X20 MAGNETIC DRP (DRAPES) ×2 IMPLANT
BENZOIN TINCTURE PRP APPL 2/3 (GAUZE/BANDAGES/DRESSINGS) ×2 IMPLANT
BLADE HEX COATED 2.75 (ELECTRODE) ×2 IMPLANT
BLADE SURG 15 STRL LF DISP TIS (BLADE) ×1 IMPLANT
BLADE SURG 15 STRL SS (BLADE) ×1
CANISTER SUCTION 2500CC (MISCELLANEOUS) ×2 IMPLANT
CHLORAPREP W/TINT 10.5 ML (MISCELLANEOUS) ×2 IMPLANT
CLIP TI MEDIUM 6 (CLIP) ×4 IMPLANT
CLIP TI WIDE RED SMALL 6 (CLIP) ×4 IMPLANT
CLOTH BEACON ORANGE TIMEOUT ST (SAFETY) ×2 IMPLANT
CLSR STERI-STRIP ANTIMIC 1/2X4 (GAUZE/BANDAGES/DRESSINGS) ×2 IMPLANT
DISSECTOR ROUND CHERRY 3/8 STR (MISCELLANEOUS) IMPLANT
DRAPE PED LAPAROTOMY (DRAPES) ×2 IMPLANT
DRESSING SURGICEL FIBRLLR 1X2 (HEMOSTASIS) ×1 IMPLANT
DRSG PAD ABDOMINAL 8X10 ST (GAUZE/BANDAGES/DRESSINGS) ×2 IMPLANT
DRSG SURGICEL FIBRILLAR 1X2 (HEMOSTASIS) ×2
ELECT REM PT RETURN 9FT ADLT (ELECTROSURGICAL) ×2
ELECTRODE REM PT RTRN 9FT ADLT (ELECTROSURGICAL) ×1 IMPLANT
GAUZE SPONGE 4X4 16PLY XRAY LF (GAUZE/BANDAGES/DRESSINGS) ×2 IMPLANT
GLOVE SURG ORTHO 8.0 STRL STRW (GLOVE) ×2 IMPLANT
GOWN STRL NON-REIN LRG LVL3 (GOWN DISPOSABLE) ×2 IMPLANT
GOWN STRL REIN XL XLG (GOWN DISPOSABLE) ×4 IMPLANT
KIT BASIN OR (CUSTOM PROCEDURE TRAY) ×2 IMPLANT
NS IRRIG 1000ML POUR BTL (IV SOLUTION) ×2 IMPLANT
PACK BASIC VI WITH GOWN DISP (CUSTOM PROCEDURE TRAY) ×2 IMPLANT
PENCIL BUTTON HOLSTER BLD 10FT (ELECTRODE) ×2 IMPLANT
SHEARS HARMONIC 9CM CVD (BLADE) ×2 IMPLANT
SPONGE GAUZE 4X4 12PLY (GAUZE/BANDAGES/DRESSINGS) ×2 IMPLANT
STAPLER VISISTAT 35W (STAPLE) ×2 IMPLANT
STRIP CLOSURE SKIN 1/2X4 (GAUZE/BANDAGES/DRESSINGS) ×2 IMPLANT
SUT MNCRL AB 4-0 PS2 18 (SUTURE) ×2 IMPLANT
SUT SILK 2 0 (SUTURE) ×1
SUT SILK 2-0 18XBRD TIE 12 (SUTURE) ×1 IMPLANT
SUT SILK 3 0 (SUTURE)
SUT SILK 3-0 18XBRD TIE 12 (SUTURE) IMPLANT
SUT VIC AB 3-0 SH 18 (SUTURE) ×2 IMPLANT
SYR BULB IRRIGATION 50ML (SYRINGE) ×2 IMPLANT
TAPE CLOTH SURG 4X10 WHT LF (GAUZE/BANDAGES/DRESSINGS) ×2 IMPLANT
TOWEL OR 17X26 10 PK STRL BLUE (TOWEL DISPOSABLE) ×2 IMPLANT
YANKAUER SUCT BULB TIP 10FT TU (MISCELLANEOUS) ×2 IMPLANT

## 2012-02-16 NOTE — H&P (View-Only) (Signed)
General Surgery - [facility]  Chief Complaint  Patient presents with  . New Evaluation    Thyroid nodule - referral from Dr. Peter Ennever    HISTORY: Patient is a 56-year-old white female who presents on referral from her medical oncologist with a newly diagnosed right thyroid nodule. Patient has a history of breast cancer. She underwent a PET scan. This showed activity in the right lobe of the thyroid. Subsequent thyroid ultrasound was performed which demonstrated a solitary nodule in the right thyroid lobe measuring 2.3 cm in greatest dimension. Left lobe was small and atrophied without evidence of nodular disease. Patient underwent ultrasound-guided fine-needle aspiration biopsy of the right thyroid nodule. 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  Patient is now referred for consideration of resection for definitive diagnosis.  Patient has had no prior head or neck surgery. She does have a long-standing history of hypothyroidism and takes Synthroid daily. This is followed by her physician in [place]. There is no family history of thyroid cancer. However the patient's mother and daughter are both treated for hypothyroidism. There is no history of other endocrinopathy.  Past Medical History  Diagnosis Date  . Cancer 06/01/06    stage II breast cancer  . Depression   . Hypertension   . Migraine   . Hypothyroidism   . Incisional hernia   . Spinal stenosis   . Scoliosis   . Sciatica   . Intestinal obstruction   . Renal insufficiency   . Diabetes mellitus     type II, controlled by diet 05/25/11   . Sleep apnea     hx of off machine x 4 years   . GERD (gastroesophageal reflux disease)   . Septic arthritis 01/28/10    and spinal stenosis , moderate scoliosis   . Osteoarthritis of multiple joints   . Anemia   . Pernicious anemia 05/30/2011    Iron transfusion and VB12 on 06/06/11  . Trouble swallowing   . Leg swelling   . Abdominal pain   . Constipation     . Nausea & vomiting      Current Outpatient Prescriptions  Medication Sig Dispense Refill  . oxyCODONE-acetaminophen (PERCOCET) 5-325 MG per tablet Take 1 tablet by mouth 3 (three) times daily.      . carvedilol (COREG) 12.5 MG tablet Take 1 tablet (12.5 mg total) by mouth 2 (two) times daily.  60 tablet  2  . cyclobenzaprine (FLEXERIL) 5 MG tablet Take 5 mg by mouth 3 (three) times daily as needed. Muscle spasms      . FLUoxetine (PROZAC) 40 MG capsule Take 40 mg by mouth daily.      . folic acid (FOLVITE) 1 MG tablet Take 2 mg by mouth daily.       . HYDROcodone-acetaminophen (LORTAB) 7.5-500 MG per tablet Take 1 tablet by mouth every 6 (six) hours as needed for pain.  60 tablet  0  . HYDROcodone-acetaminophen (NORCO) 5-325 MG per tablet Take 1 tablet by mouth every 6 (six) hours as needed for pain.  60 tablet  0  . HYDROcodone-acetaminophen (NORCO/VICODIN) 5-325 MG per tablet Take 1 tablet by mouth every 6 (six) hours as needed. pain  30 tablet  0  . levothyroxine (SYNTHROID, LEVOTHROID) 200 MCG tablet Take 1 tablet (200 mcg total) by mouth daily before breakfast.  30 tablet  1  . PredniSONE 5 MG TBEC Take 15 mg by mouth daily.       .   promethazine (PHENERGAN) 25 MG tablet Take 1 tablet (25 mg total) by mouth every 6 (six) hours as needed. nausea  30 tablet  1  . SUMAtriptan (IMITREX) 50 MG tablet Take 50 mg by mouth every 2 (two) hours as needed. One tablet by mouth at start of migraine. May repeat in 2 hours once in 24 hrs as needed      . tiZANidine (ZANAFLEX) 4 MG tablet Take 1 tablet (4 mg total) by mouth 3 (three) times daily.  90 tablet  0     Allergies  Allergen Reactions  . Influenza Vac Split (Flu Virus Vaccine) Other (See Comments)    Joint stiffness and renal failure  . Zostavax (Zoster Vaccine Live ([employer])) Other (See Comments)    Joint stiffness, renal failure  . Adhesive (Tape) Itching and Rash     Family History  Problem Relation Age of Onset  . Cancer  Mother     breast  . Hypertension Mother   . Anesthesia problems Daughter      History   Social History  . Marital Status: Divorced    Spouse Name: N/A    Number of Children: 1  . Years of Education: N/A   Social History Main Topics  . Smoking status: Never Smoker   . Smokeless tobacco: Never Used  . Alcohol Use: No  . Drug Use: No  . Sexually Active: None   Other Topics Concern  . None   Social History Narrative   Caffeine use: 3 cups coffee/tea dailyRegular exercise: no     REVIEW OF SYSTEMS - PERTINENT POSITIVES ONLY: No palpable masses or discomfort in the neck. No compressive symptoms. No changes in voice quality. Patient does note an incisional hernia on the abdomen which has been present that is relatively asymptomatic.  She requests examination today.  EXAM: Filed Vitals:   01/17/12 1142  BP: 130/78  Pulse: 68  Temp: 97.3 F (36.3 C)  Resp: 16    HEENT: normocephalic; pupils equal and reactive; sclerae clear; dentition good; mucous membranes moist NECK:  Palpation reveals a relatively small soft thyroid gland with a dominant nodule in the mid right thyroid lobe which is mildly tender. It measures approximately 2 cm in size; symmetric on extension; no palpable anterior or posterior cervical lymphadenopathy; no supraclavicular masses; no tenderness CHEST: clear to auscultation bilaterally without rales, rhonchi, or wheezes CARDIAC: regular rate and rhythm without significant murmur; peripheral pulses are full ABDOMEN: soft without distension; bowel sounds present; no mass; no hepatosplenomegaly; well-healed surgical incisions in the right subcostal position and midline of the abdomen. There is an obvious bulge to the right of midline. The fascial defect measures approximately 5 cm in size. This augments with Valsalva. It is easily reducible. It is nontender. EXT:  non-tender without edema; no deformity NEURO: no gross focal deficits; no sign of  tremor   LABORATORY RESULTS: See [facility] ([facility]) for most recent results   RADIOLOGY RESULTS: See [facility] ([facility]) for most recent results   IMPRESSION: #1 right thyroid nodule, Hurthle cell changes with cytologic atypia on fine needle aspiration biopsy, 2.3 cm #2 ventral abdominal incisional hernia, reducible #3 metastatic breast cancer #4 history of morbid obesity, status post gastric bypass procedure  PLAN: The patient and I and her daughter had a lengthy discussion regarding the above issues and findings. I provided him with written literature on thyroid disease. We discussed management of the right thyroid nodule.  Patient would like to proceed immediately with resection   of the right thyroid nodule for definitive diagnosis. We discussed risk and benefits of right thyroid lobectomy. We discussed the potential for recurrent nerve injury. We discussed the parathyroid glands and their function. We discussed the potential need for completion thyroidectomy in the event of malignancy. They understand and agree to proceed in the near future.  Elvin Mccartin M. Tracey Stewart, MD, FACS General & Endocrine Surgery [facility]   Visit Diagnoses: 1. Neoplasm of uncertain behavior of thyroid gland, right lobe     Primary Care Physician: Provider Not In System   

## 2012-02-16 NOTE — Interval H&P Note (Signed)
History and Physical Interval Note:  02/16/2012 9:35 AM  Kerri Vasquez  has presented today for surgery, with the diagnosis of Hurthle cell nodule of thyroid.  The various methods of treatment have been discussed with the patient and family. After consideration of risks, benefits and other options for treatment, the patient has consented to    Procedure(s) (LRB) with comments: THYROID LOBECTOMY (Right) as a surgical intervention .    The patient's history has been reviewed, patient examined, no change in status, stable for surgery.  I have reviewed the patient's chart and labs.  Questions were answered to the patient's satisfaction.    [name], MD, FACS General & Endocrine Surgery [facility] Office: [phone]   [name]

## 2012-02-16 NOTE — Transfer of Care (Signed)
Immediate Anesthesia Transfer of Care Note  Patient: Kerri Vasquez  Procedure(s) Performed: Procedure(s) (LRB) with comments: THYROID LOBECTOMY (Right)  Patient Location: PACU  Anesthesia Type: General  Level of Consciousness: awake, alert  and patient cooperative  Airway & Oxygen Therapy: Patient Spontanous Breathing and Patient connected to face mask oxygen  Post-op Assessment: Report given to PACU RN, Post -op Vital signs reviewed and unstable, Anesthesiologist notified and Patient moving all extremities  Post vital signs: Reviewed and stable  Complications: No apparent anesthesia complications

## 2012-02-16 NOTE — Anesthesia Preprocedure Evaluation (Addendum)
Anesthesia Evaluation  Patient identified by MRN, date of birth, ID band Patient awake    Reviewed: Allergy & Precautions, H&P , NPO status , Patient's Chart, lab work & pertinent test results, reviewed documented beta blocker date and time   Airway Mallampati: II TM Distance: >3 FB Neck ROM: full    Dental  (+) Chipped and Missing,    Pulmonary sleep apnea and Continuous Positive Airway Pressure Ventilation ,  breath sounds clear to auscultation  Pulmonary exam normal       Cardiovascular hypertension, Pt. on medications and Pt. on home beta blockers Rhythm:regular Rate:Normal     Neuro/Psych  Headaches, Depression  Neuromuscular disease    GI/Hepatic Neg liver ROS, GERD-  Medicated and Controlled,  Endo/Other  diabetes, Well Controlled, Type 2, Oral Hypoglycemic AgentsHypothyroidism Morbid obesity  Renal/GU Renal InsufficiencyRenal disease  negative genitourinary   Musculoskeletal  (+) Arthritis -, Rheumatoid disorders,  Fibromyalgia -, narcotic dependent  Abdominal (+) + obese,   Peds  Hematology  (+) Blood dyscrasia, anemia , Pernicious anemia 2013   Anesthesia Other Findings Breast Ca  Reproductive/Obstetrics negative OB ROS                          Anesthesia Physical Anesthesia Plan  ASA: III  Anesthesia Plan: General   Post-op Pain Management:    Induction: Intravenous  Airway Management Planned: Oral ETT  Additional Equipment:   Intra-op Plan:   Post-operative Plan: Extubation in OR  Informed Consent: I have reviewed the patients History and Physical, chart, labs and discussed the procedure including the risks, benefits and alternatives for the proposed anesthesia with the patient or authorized representative who has indicated his/her understanding and acceptance.   Dental advisory given  Plan Discussed with: CRNA  Anesthesia Plan Comments:         Anesthesia  Quick Evaluation

## 2012-02-16 NOTE — Anesthesia Postprocedure Evaluation (Signed)
Anesthesia Post Note  Patient: Kerri Vasquez  Procedure(s) Performed: Procedure(s) (LRB): THYROID LOBECTOMY (Right)  Anesthesia type: General  Patient location: PACU  Post pain: Pain level controlled  Post assessment: Post-op Vital signs reviewed  Last Vitals:  Filed Vitals:   02/16/12 1150  BP: 166/93  Pulse: 84  Temp: 36.7 C  Resp: 12    Post vital signs: Reviewed  Level of consciousness: sedated  Complications: No apparent anesthesia complications

## 2012-02-16 NOTE — Progress Notes (Addendum)
Call to OR w [facility]. Pt asymptomatic at 80. To Holding early per [employer].

## 2012-02-16 NOTE — Brief Op Note (Signed)
02/16/2012  11:42 AM  PATIENT:  Kerri Vasquez  56 y.o. female  PRE-OPERATIVE DIAGNOSIS:  Hurthle cell nodule of thyroid  POST-OPERATIVE DIAGNOSIS:  Hurthle cell nodule of thyroid  PROCEDURE:  Procedure(s) (LRB) with comments: THYROID LOBECTOMY (Right)  SURGEON:  Surgeon(s) and Role:    * [name], MD - Primary  ANESTHESIA:   general  EBL:  Total I/O In: 1000 [I.V.:1000] Out: -   BLOOD ADMINISTERED:none  DRAINS: none   LOCAL MEDICATIONS USED:  NONE  SPECIMEN:  Excision  DISPOSITION OF SPECIMEN:  PATHOLOGY  COUNTS:  YES  TOURNIQUET:  * No tourniquets in log *  DICTATION: .Other Dictation: Dictation Number [id number]  PLAN OF CARE: Admit for overnight observation  PATIENT DISPOSITION:  PACU - hemodynamically stable.   Delay start of Pharmacological VTE agent (>24hrs) due to surgical blood loss or risk of bleeding: yes  [name], MD, FACS General & Endocrine Surgery [facility] Office: [phone]

## 2012-02-17 LAB — BASIC METABOLIC PANEL
BUN: 15 mg/dL (ref 6–23)
CO2: 24 mEq/L (ref 19–32)
Calcium: 8.6 mg/dL (ref 8.4–10.5)
Glucose, Bld: 109 mg/dL — ABNORMAL HIGH (ref 70–99)
Potassium: 4.1 mEq/L (ref 3.5–5.1)
Sodium: 137 mEq/L (ref 135–145)

## 2012-02-17 LAB — CBC
HCT: 35.1 % — ABNORMAL LOW (ref 36.0–46.0)
Hemoglobin: 11.7 g/dL — ABNORMAL LOW (ref 12.0–15.0)
MCH: 31.5 pg (ref 26.0–34.0)
MCHC: 33.3 g/dL (ref 30.0–36.0)
MCV: 94.4 fL (ref 78.0–100.0)
Platelets: 283 K/uL (ref 150–400)
RBC: 3.72 MIL/uL — ABNORMAL LOW (ref 3.87–5.11)
RDW: 12.9 % (ref 11.5–15.5)
WBC: 8.4 K/uL (ref 4.0–10.5)

## 2012-02-17 MED ORDER — OXYCODONE-ACETAMINOPHEN 5-325 MG PO TABS: 1.0000 | ORAL_TABLET | Freq: Four times a day (QID) | ORAL | Status: DC | PRN

## 2012-02-17 NOTE — Progress Notes (Signed)
Pt stable, scripts, and d/c instructions given with no questions/concerns voiced.  Pt refused wheelchair and walked to private vehicle with NT and daughter.

## 2012-02-17 NOTE — Discharge Summary (Signed)
Physician Discharge Summary [facility]  Patient ID: Kerri Vasquez MRN: [id number] DOB/AGE: 1956-03-12 56 y.o.  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  Stable.  No soft tissue swelling or bleeding.  Pain controlled.  Prepared for discharge POD#1.  Consults: None  Significant Diagnostic Studies: none  Treatments: surgery: right thyroid lobectomy  Discharge Exam: Blood pressure 161/87, pulse 69, temperature 98.2 F (36.8 C), temperature source Oral, resp. rate 16, height 5\' 6"  (1.676 m), weight 271 lb (122.925 kg), SpO2 97.00%. HEENT - clear Neck - wound clear and dry, mild STS, voice normal Chest - clear Cor - RRR  Disposition: Home with family  Discharge Orders    Future Appointments: Provider: Department: Dept Phone: Center:   02/27/2012 10:00 AM Rachael Fee [facility] [phone] None   02/27/2012 10:30 AM Josph Macho, MD [facility] [phone] None   02/27/2012 11:15 AM [facility] [phone] None   03/12/2012 9:30 AM [name], MD [facility] [phone] None     Future Orders Please Complete By Expires   Diet - low sodium heart healthy      Increase activity slowly      Discharge instructions      Comments:   [facility], [place] [phone]  THYROID & PARATHYROID SURGERY -- POST OP INSTRUCTIONS  Always review your discharge instruction sheet from the facility where your surgery was performed.  A prescription for pain medication may be given to you upon discharge.  Take your pain medication as prescribed.  If narcotic pain medicine is not needed, then you may take acetaminophen  (Tylenol) or ibuprofen (Advil) as needed. Take your usually prescribed medications unless otherwise directed. If you need a refill on your pain medication, please contact your pharmacy. They will contact our office to request authorization.  Prescriptions will not be processed after 5 pm or on weekends. Start with a light diet upon arrival home, such as soup and crackers or toast.  Be sure to drink plenty of fluids daily.  Resume your normal diet the day after surgery. Most patients will experience some swelling and bruising on the chest and neck area.  Ice packs will help.  Swelling and bruising can take several days to resolve.  It is common to experience some constipation if taking pain medication after surgery.  Increasing fluid intake and taking a stool softener will usually help or prevent this problem.  A mild laxative (Milk of Magnesia or Miralax) should be taken according to package directions if there are no bowel movements after 48 hours. You may remove your bandages 24-48 hours after surgery, and you may shower at that time.  You have steri-strips (small skin tapes) in place directly over the incision.  These strips should be left on the skin for 7-10 days and then removed. You may resume regular (light) daily activities beginning the next day-such as daily self-care, walking, climbing stairs-gradually increasing activities as tolerated.  You may have sexual intercourse when it is comfortable.  Refrain from any heavy lifting or straining until approved by your doctor.  You may drive when you no longer are taking prescription pain medication, you can comfortably wear a seatbelt, and you can safely maneuver your car  and apply brakes. You should see your doctor in the office for a follow-up appointment approximately two to three weeks after your surgery.  Make sure that you call for this appointment within a day or two after you arrive home to insure a convenient appointment time.  WHEN TO CALL  YOUR DOCTOR: Fever greater than 101.5 Inability to urinate Nausea and/or vomiting - persistent Extreme swelling or bruising Continued bleeding from incision Increased pain, redness, or drainage from the incision Difficulty swallowing or breathing Muscle cramping or spasms Numbness or tingling in hands or around lips  The clinic staff is available to answer your questions during regular business hours.  Please don't hesitate to call and ask to speak to one of the nurses if you have concerns. [facility], [place] [phone]  [email or web address]   Remove dressing in 24 hours          Medication List     As of 02/17/2012  6:48 AM    TAKE these medications         B-complex with vitamin C tablet   Take 1 tablet by mouth daily.      bisacodyl 5 MG EC tablet   Generic drug: bisacodyl   Take 10 mg by mouth every evening. 2 5 mg tabs at night      carvedilol 12.5 MG tablet   Commonly known as: COREG   Take 12.5 mg by mouth 2 (two) times daily.      cyclobenzaprine 5 MG tablet   Commonly known as: FLEXERIL   Take 5 mg by mouth 3 (three) times daily as needed. Muscle spasms      FLUoxetine 40 MG capsule   Commonly known as: PROZAC   Take 40 mg by mouth daily before breakfast.      folic acid 1 MG tablet   Commonly known as: FOLVITE   Take 2 mg by mouth daily.      fulvestrant 250 MG/5ML injection   Commonly known as: FASLODEX   Inject 250 mg into the muscle every 30 (thirty) days. One injection each buttock over 1-2 minutes. Warm prior to use.      levothyroxine 200 MCG tablet   Commonly known as: SYNTHROID, LEVOTHROID   Take 200 mcg by mouth daily before breakfast.      lidocaine 5 % ointment   Commonly known as: XYLOCAINE   Apply 1 application topically as needed. For leg and back pain      multivitamin with minerals Tabs   Take 1 tablet by mouth daily.      oxyCODONE-acetaminophen 5-325 MG per tablet   Commonly known as: PERCOCET/ROXICET     Take 1 tablet by mouth every 6 (six) hours as needed for pain.      oxyCODONE-acetaminophen 10-325 MG per tablet   Commonly known as: PERCOCET   Take 1 tablet by mouth every 6 (six) hours as needed. For pain      predniSONE 5 MG tablet   Commonly known as: DELTASONE   Take 15 mg by mouth every morning. Takes 3 tablets      promethazine 25 MG tablet   Commonly known as: PHENERGAN   Take 25 mg by mouth every 6 (six) hours as needed. nausea      SUMAtriptan 50 MG tablet   Commonly known as: IMITREX   Take 50 mg by mouth every 2 (two) hours as needed. One tablet by mouth at start of migraine. May repeat in 2 hours once in  24 hrs as needed      tiZANidine 4 MG tablet   Commonly known as: ZANAFLEX   Take 4 mg by mouth 3 (three) times daily.      Vitamin D 2000 UNITS tablet   Take 10,000 Units by mouth 3 (three) times a week. Monday Thursday and sunday       [name], MD, FACS General & Endocrine Surgery [facility] Office: [phone]   Signed: [name] 02/17/2012, 6:48 AM

## 2012-02-17 NOTE — Op Note (Signed)
Kerri Vasquez, Kerri Vasquez [facility]           ACCOUNT NO.:  [id number]  MEDICAL RECORD NO.:  [id number]  LOCATION:  1613                         FACILITY:  [facility]  PHYSICIAN:  [name], MD      DATE OF BIRTH:  09-25-1955  DATE OF PROCEDURE:  02/16/2012                               OPERATIVE REPORT   PREOPERATIVE DIAGNOSIS:  Right thyroid nodule with atypia.  POSTOPERATIVE DIAGNOSIS:  Right thyroid nodule with atypia.  PROCEDURE:  Right thyroid lobectomy.  SURGEON:  [name], MD, FACS  ANESTHESIA:  General per Dr. Lucille Passy.  ESTIMATED BLOOD LOSS:  Minimal.  PREPARATION:  ChloraPrep.  COMPLICATIONS:  None.  INDICATIONS:  The patient is a 56 year old white female who presents on referral from her medical oncologist with a newly diagnosed right thyroid nodule.  She has a history of breast cancer.  Followup PET scanning revealed a nodule in the right lobe of the thyroid with activity.  Ultrasound demonstrated a solitary nodule measuring 2.3 cm. Left lobe appeared to be small and atrophied without evidence of nodular disease.  Ultrasound-guided fine-needle aspiration biopsy was performed and showed Hurthle cell changes with mild atypia.  The patient now comes to Surgery for resection for definitive diagnosis.  BODY OF REPORT:  Procedure was done in OR #1 at the [facility].  The patient was brought to the operating room, placed in the supine position on the operating room table.  Following administration of general anesthesia, the patient was positioned and then prepped and draped in the usual strict aseptic fashion.  After ascertaining that an adequate level of anesthesia been achieved, a Kocher incision was made with a #15 blade.  Dissection was carried through the subcutaneous tissues and platysma.  Hemostasis was obtained with electrocautery.  Skin flaps were elevated cephalad and caudad from the thyroid notch to the sternal notch.  A Mahorner  self-retaining retractor was placed for exposure.  Strap muscles were incised in the midline.  The lobe was palpated.  There were no significant nodules. There was no lymphadenopathy.  Examination of the right side shows a slightly atrophic right thyroid lobe containing a dominant nodule.  The innominate artery was well within the operative field inferiorly.  It was slightly ectatic.  Strap muscles were reflected laterally exposing the right thyroid lobe. Venous tributaries were divided between medium Ligaclips with the Harmonic scalpel.  Superior pole vessels were taken down and divided individually between small and medium Ligaclips with the Harmonic scalpel.  Gland was rolled anteriorly.  Inferior venous tributaries were divided between small and medium Ligaclips with the Harmonic scalpel. The gland was rolled anteriorly.  Branches of the inferior thyroid artery were divided between small Ligaclips with the Harmonic scalpel. Recurrent laryngeal nerve was identified and preserved.  Superior parathyroid gland was identified and preserved on its vascular pedicle. Gland was mobilized onto the anterior trachea.  There was a moderate- sized pyramidal lobe, which was dissected off the anterior thyroid cartilage and resected with the isthmus.  The isthmus was mobilized across the midline and divided at its junction with the left thyroid lobe using the Harmonic scalpel for tissue division and hemostasis. Specimen was marked with a  suture in the right superior pole.  The gland was submitted to Pathology for review.  The neck was irrigated copiously with warm saline.  Good hemostasis was achieved throughout.  Surgicel was placed throughout the operative field.  Strap muscles were reapproximated in the midline with interrupted 3-0 Vicryl sutures.  Platysma was closed with interrupted 3- 0 Vicryl sutures.  Skin was closed with a running 4-0 Monocryl subcuticular suture.  Wound was washed and  dried, and benzoin and Steri- Strips were applied.  Sterile dressings were applied.  The patient was awakened from anesthesia and brought to the recovery room.  The patient tolerated the procedure well.  [name], MD, FACS General & Endocrine Surgery [facility] Office: [phone]  TMG/MEDQ  D:  02/16/2012  T:  02/17/2012  Job:  [id number]  cc:   Josph Macho, M.D. Fax: [phone]

## 2012-02-19 ENCOUNTER — Encounter (HOSPITAL_COMMUNITY): Payer: Self-pay | Admitting: Surgery

## 2012-02-21 ENCOUNTER — Telehealth (INDEPENDENT_AMBULATORY_CARE_PROVIDER_SITE_OTHER): Payer: Self-pay | Admitting: Surgery

## 2012-02-21 DIAGNOSIS — D440 Neoplasm of uncertain behavior of thyroid gland: Secondary | ICD-10-CM

## 2012-02-21 NOTE — Telephone Encounter (Signed)
I discussed the pathology results with Dr. Jimmy Picket today. 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 There is some question as to whether or not this is benign or malignant. The slides will be sent out for expert opinion to Dr. Tonny Bollman. We will await those results.  [name], MD, [facility] Office: [phone]

## 2012-02-26 DIAGNOSIS — M48061 Spinal stenosis, lumbar region without neurogenic claudication: Secondary | ICD-10-CM | POA: Diagnosis not present

## 2012-02-26 DIAGNOSIS — M069 Rheumatoid arthritis, unspecified: Secondary | ICD-10-CM | POA: Diagnosis not present

## 2012-02-27 ENCOUNTER — Ambulatory Visit (HOSPITAL_BASED_OUTPATIENT_CLINIC_OR_DEPARTMENT_OTHER): Payer: Medicare Other | Admitting: Hematology & Oncology

## 2012-02-27 ENCOUNTER — Ambulatory Visit (HOSPITAL_BASED_OUTPATIENT_CLINIC_OR_DEPARTMENT_OTHER): Payer: Medicare Other

## 2012-02-27 ENCOUNTER — Other Ambulatory Visit (HOSPITAL_BASED_OUTPATIENT_CLINIC_OR_DEPARTMENT_OTHER): Payer: Medicare Other | Admitting: Lab

## 2012-02-27 VITALS — BP 162/86 | HR 63 | Temp 97.6°F | Resp 18 | Ht 66.0 in | Wt 276.0 lb

## 2012-02-27 DIAGNOSIS — C50919 Malignant neoplasm of unspecified site of unspecified female breast: Secondary | ICD-10-CM

## 2012-02-27 DIAGNOSIS — E079 Disorder of thyroid, unspecified: Secondary | ICD-10-CM | POA: Diagnosis not present

## 2012-02-27 DIAGNOSIS — C787 Secondary malignant neoplasm of liver and intrahepatic bile duct: Secondary | ICD-10-CM | POA: Diagnosis not present

## 2012-02-27 DIAGNOSIS — Z5111 Encounter for antineoplastic chemotherapy: Secondary | ICD-10-CM | POA: Diagnosis not present

## 2012-02-27 DIAGNOSIS — C50519 Malignant neoplasm of lower-outer quadrant of unspecified female breast: Secondary | ICD-10-CM

## 2012-02-27 DIAGNOSIS — D509 Iron deficiency anemia, unspecified: Secondary | ICD-10-CM | POA: Diagnosis not present

## 2012-02-27 DIAGNOSIS — D510 Vitamin B12 deficiency anemia due to intrinsic factor deficiency: Secondary | ICD-10-CM

## 2012-02-27 DIAGNOSIS — E538 Deficiency of other specified B group vitamins: Secondary | ICD-10-CM

## 2012-02-27 DIAGNOSIS — G8929 Other chronic pain: Secondary | ICD-10-CM

## 2012-02-27 LAB — CBC WITH DIFFERENTIAL (CANCER CENTER ONLY)
BASO#: 0 10*3/uL (ref 0.0–0.2)
EOS%: 2 % (ref 0.0–7.0)
HGB: 12.1 g/dL (ref 11.6–15.9)
LYMPH%: 24.5 % (ref 14.0–48.0)
MCH: 32.2 pg (ref 26.0–34.0)
MCHC: 33.1 g/dL (ref 32.0–36.0)
MCV: 97 fL (ref 81–101)
MONO%: 8.2 % (ref 0.0–13.0)
NEUT#: 4.1 10*3/uL (ref 1.5–6.5)
NEUT%: 64.8 % (ref 39.6–80.0)

## 2012-02-27 LAB — COMPREHENSIVE METABOLIC PANEL
CO2: 29 mEq/L (ref 19–32)
Creatinine, Ser: 0.99 mg/dL (ref 0.50–1.10)
Glucose, Bld: 79 mg/dL (ref 70–99)
Sodium: 139 mEq/L (ref 135–145)
Total Bilirubin: 0.5 mg/dL (ref 0.3–1.2)
Total Protein: 6.1 g/dL (ref 6.0–8.3)

## 2012-02-27 LAB — IRON AND TIBC
%SAT: 41 % (ref 20–55)
Iron: 116 ug/dL (ref 42–145)

## 2012-02-27 LAB — LACTATE DEHYDROGENASE: LDH: 159 U/L (ref 94–250)

## 2012-02-27 MED ORDER — CYANOCOBALAMIN 1000 MCG/ML IJ SOLN
1000.0000 ug | Freq: Once | INTRAMUSCULAR | Status: AC
  Administered 2012-02-27: 1000 ug via INTRAMUSCULAR

## 2012-02-27 MED ORDER — FULVESTRANT 250 MG/5ML IM SOLN
500.0000 mg | Freq: Once | INTRAMUSCULAR | Status: AC
  Administered 2012-02-27: 500 mg via INTRAMUSCULAR
  Filled 2012-02-27: qty 10

## 2012-02-27 NOTE — Progress Notes (Signed)
This office note has been dictated.

## 2012-02-28 NOTE — Progress Notes (Signed)
Quick Note:  Please contact patient and notify of benign pathology results.  Adelbert Gaspard M. Marytza Grandpre, MD, FACS [facility] Office: [phone]   ______ 

## 2012-02-28 NOTE — Progress Notes (Signed)
DIAGNOSES: 1. Metastatic breast cancer-solitary liver metastasis-status post     ablation. 2. 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 3. Vitamin B12 deficiency secondary to gastric bypass. 4. Severe rheumatoid arthritis. 5. Intermittent iron-deficiency anemia.  CURRENT THERAPY: 1. Patient is status post RFA for the liver lesion. 2. Patient is status post right thyroid lobectomy. 3. Faslodex 500 mg IM monthly. 4. Vitamin B12 1 mg IM monthly.  INTERIM HISTORY:  [name] comes in for a follow-up.  Since I last saw her, she has been quite busy.  She underwent the RFA of the liver met.  Extensive workup did not show any other lesions.  She had successful targeting and ablation of the single liver met.  She has tolerated her Faslodex quite well.  She does have a few hot flashes.  She then underwent right thyroid lobectomy by Dr. Gerrit Vasquez.  This was on October 18th.  I spoke with the pathologist.  He states that this is a Hurthle cell neoplasm.  Otherwise, she has been doing okay.  She is now wondering about having hernia surgery.  She will, I think, go back to see the surgeon regarding this.  Apparently, she has tolerated the Faslodex well.  She is not complaining of any abdominal pain.  There is a little soreness in the right upper quadrant.  Her last tumor marker that was done back in September showed a CA27.29 of 30.  She has had no bleeding.  Her rheumatoid arthritis has been pretty rough on her.  She, I think, goes back to see the rheumatologist in a week or so.  PHYSICAL EXAMINATION:  General:  This is an obese white female in no obvious distress.  Vital signs:  97.6, pulse 63, respiratory rate 18, blood pressure 162/86.  Weight is 276.  Head and neck:  Normocephalic, atraumatic skull.  There are no ocular or oral lesions.  There are no palpable cervical or supraclavicular lymph nodes.  She has surgical strips and a thyroid  lobectomy scar that is well healed.  Lungs:  Clear bilaterally.  Cardiac:  Regular rate and rhythm with a normal S1, S2. There are no murmurs, rubs, or bruits.  Abdomen:  Soft with good bowel sounds.  There is no palpable abdominal mass.  There is no palpable hepatosplenomegaly.  Maybe some slight tenderness in the right upper quadrant.  Extremities:  No joint swelling, erythema, or warmth. Neurological:  No focal neurological deficits.  LABORATORY STUDIES:  White cell count 6.4, hemoglobin 12.1, hematocrit 36.6, platelet count 225.  IMPRESSION:  [name] is a 56 year old white female with multiple issues.  The main one, from my point of view, is the metastatic breast cancer.  She had a solitary liver metastasis.  This was treated with radiofrequency ablation.  She is also getting systemic therapy for any type of micrometastatic disease.  I would not plan for any follow-up scans for the breast cancer probably until December.  I want to make sure that we give enough time for systemic treatments to work and also for the changes from the radiofrequency ablation to resolve.  I do not see that we need to do anything with the thyroid lesion.  This is a Hurthle cell neoplasm which typically does not require any follow- up therapy.  We did go ahead and give her B12 today.  We are following her iron studies.  We will stay on top of that.  We will have  her come back in 1 month.    ______________________________ Kerri Vasquez, M.D. PRE/MEDQ  D:  02/27/2012  T:  02/28/2012  Job:  [id number]

## 2012-03-12 ENCOUNTER — Encounter (INDEPENDENT_AMBULATORY_CARE_PROVIDER_SITE_OTHER): Payer: Self-pay | Admitting: Surgery

## 2012-03-12 ENCOUNTER — Ambulatory Visit (INDEPENDENT_AMBULATORY_CARE_PROVIDER_SITE_OTHER): Payer: Medicare Other | Admitting: Surgery

## 2012-03-12 VITALS — BP 162/80 | HR 72 | Temp 97.8°F | Resp 16 | Ht 66.0 in | Wt 278.8 lb

## 2012-03-12 DIAGNOSIS — D449 Neoplasm of uncertain behavior of unspecified endocrine gland: Secondary | ICD-10-CM

## 2012-03-12 DIAGNOSIS — D440 Neoplasm of uncertain behavior of thyroid gland: Secondary | ICD-10-CM

## 2012-03-12 NOTE — Progress Notes (Signed)
General Surgery [facility]  Visit Diagnoses: 1. Neoplasm of uncertain behavior of thyroid gland, right lobe     HISTORY: Patient returns for her first postoperative visit having undergone right thyroid lobectomy on 02/16/2012. Final pathology reveals a Hurthle cell neoplasm which is likely benign. This was reviewed by our pathologist, Dr. Jimmy Picket, and also sent out for expert second opinion to Dr. Tonny Bollman at the [facility]. Both of them concur that this is likely a minimally aggressive lesion.  EXAM: Surgical incision is healing nicely. No significant soft tissue swelling. No sign of seroma. No sign of infection. Voice quality is normal.  IMPRESSION: Hurthle cell neoplasm, status post right thyroid lobectomy  PLAN: Patient will have a TSH level drawn in approximately 2 weeks. She will begin applying topical creams to her incision. She will return for a wound check in 6 weeks. She will require a followup thyroid ultrasound in one year.  Kerri Heckler, MD, FACS General & Endocrine Surgery [facility]

## 2012-03-12 NOTE — Patient Instructions (Signed)
  COCOA BUTTER & VITAMIN E CREAM  (Palmer's or other brand)  Apply cocoa butter/vitamin E cream to your incision 2 - 3 times daily.  Massage cream into incision for one minute with each application.  Use sunscreen (50 SPF or higher) for first 6 months after surgery if area is exposed to sun.  You may substitute Mederma or other scar reducing creams as desired.   

## 2012-03-21 ENCOUNTER — Ambulatory Visit: Admission: RE | Admit: 2012-03-21 | Payer: Self-pay | Source: Ambulatory Visit

## 2012-03-21 ENCOUNTER — Ambulatory Visit (HOSPITAL_COMMUNITY)
Admission: RE | Admit: 2012-03-21 | Discharge: 2012-03-21 | Disposition: A | Discharge status: Home / Self Care | Payer: Medicare Other | Source: Ambulatory Visit | Attending: Interventional Radiology | Admitting: Interventional Radiology

## 2012-03-21 DIAGNOSIS — K439 Ventral hernia without obstruction or gangrene: Secondary | ICD-10-CM | POA: Insufficient documentation

## 2012-03-21 DIAGNOSIS — N200 Calculus of kidney: Secondary | ICD-10-CM | POA: Diagnosis not present

## 2012-03-21 DIAGNOSIS — M4120 Other idiopathic scoliosis, site unspecified: Secondary | ICD-10-CM | POA: Insufficient documentation

## 2012-03-21 DIAGNOSIS — M47817 Spondylosis without myelopathy or radiculopathy, lumbosacral region: Secondary | ICD-10-CM | POA: Insufficient documentation

## 2012-03-21 DIAGNOSIS — K7689 Other specified diseases of liver: Secondary | ICD-10-CM | POA: Diagnosis not present

## 2012-03-21 DIAGNOSIS — K769 Liver disease, unspecified: Secondary | ICD-10-CM

## 2012-03-21 DIAGNOSIS — C50919 Malignant neoplasm of unspecified site of unspecified female breast: Secondary | ICD-10-CM | POA: Diagnosis not present

## 2012-03-21 MED ORDER — IOHEXOL 300 MG/ML  SOLN
125.0000 mL | Freq: Once | INTRAMUSCULAR | Status: AC | PRN
  Administered 2012-03-21: 125 mL via INTRAVENOUS

## 2012-03-22 ENCOUNTER — Ambulatory Visit (HOSPITAL_BASED_OUTPATIENT_CLINIC_OR_DEPARTMENT_OTHER): Payer: Medicare Other

## 2012-03-22 ENCOUNTER — Ambulatory Visit (HOSPITAL_BASED_OUTPATIENT_CLINIC_OR_DEPARTMENT_OTHER): Payer: Medicare Other | Admitting: Medical

## 2012-03-22 ENCOUNTER — Telehealth: Payer: Self-pay | Admitting: Hematology & Oncology

## 2012-03-22 ENCOUNTER — Other Ambulatory Visit (HOSPITAL_BASED_OUTPATIENT_CLINIC_OR_DEPARTMENT_OTHER): Payer: Medicare Other | Admitting: Lab

## 2012-03-22 VITALS — BP 155/78 | HR 69 | Temp 98.2°F | Resp 16 | Ht 66.0 in | Wt 284.0 lb

## 2012-03-22 DIAGNOSIS — D509 Iron deficiency anemia, unspecified: Secondary | ICD-10-CM

## 2012-03-22 DIAGNOSIS — D449 Neoplasm of uncertain behavior of unspecified endocrine gland: Secondary | ICD-10-CM | POA: Diagnosis not present

## 2012-03-22 DIAGNOSIS — D510 Vitamin B12 deficiency anemia due to intrinsic factor deficiency: Secondary | ICD-10-CM

## 2012-03-22 DIAGNOSIS — Z79899 Other long term (current) drug therapy: Secondary | ICD-10-CM | POA: Diagnosis not present

## 2012-03-22 DIAGNOSIS — M069 Rheumatoid arthritis, unspecified: Secondary | ICD-10-CM | POA: Diagnosis not present

## 2012-03-22 DIAGNOSIS — C50919 Malignant neoplasm of unspecified site of unspecified female breast: Secondary | ICD-10-CM | POA: Diagnosis not present

## 2012-03-22 DIAGNOSIS — M5137 Other intervertebral disc degeneration, lumbosacral region: Secondary | ICD-10-CM | POA: Diagnosis not present

## 2012-03-22 DIAGNOSIS — C50519 Malignant neoplasm of lower-outer quadrant of unspecified female breast: Secondary | ICD-10-CM

## 2012-03-22 DIAGNOSIS — C787 Secondary malignant neoplasm of liver and intrahepatic bile duct: Secondary | ICD-10-CM

## 2012-03-22 DIAGNOSIS — E538 Deficiency of other specified B group vitamins: Secondary | ICD-10-CM

## 2012-03-22 DIAGNOSIS — Z5111 Encounter for antineoplastic chemotherapy: Secondary | ICD-10-CM | POA: Diagnosis not present

## 2012-03-22 DIAGNOSIS — M48061 Spinal stenosis, lumbar region without neurogenic claudication: Secondary | ICD-10-CM | POA: Diagnosis not present

## 2012-03-22 DIAGNOSIS — Z5181 Encounter for therapeutic drug level monitoring: Secondary | ICD-10-CM | POA: Diagnosis not present

## 2012-03-22 DIAGNOSIS — E890 Postprocedural hypothyroidism: Secondary | ICD-10-CM | POA: Diagnosis not present

## 2012-03-22 LAB — COMPREHENSIVE METABOLIC PANEL
AST: 15 U/L (ref 0–37)
Albumin: 3.9 g/dL (ref 3.5–5.2)
Alkaline Phosphatase: 76 U/L (ref 39–117)
Potassium: 4.8 mEq/L (ref 3.5–5.3)
Sodium: 141 mEq/L (ref 135–145)
Total Bilirubin: 0.3 mg/dL (ref 0.3–1.2)
Total Protein: 6.5 g/dL (ref 6.0–8.3)

## 2012-03-22 LAB — CBC WITH DIFFERENTIAL (CANCER CENTER ONLY)
EOS%: 1 % (ref 0.0–7.0)
LYMPH%: 11.5 % — ABNORMAL LOW (ref 14.0–48.0)
MCH: 32.3 pg (ref 26.0–34.0)
MCHC: 32.7 g/dL (ref 32.0–36.0)
MONO%: 3.6 % (ref 0.0–13.0)
NEUT#: 5.7 10*3/uL (ref 1.5–6.5)
Platelets: 218 10*3/uL (ref 145–400)
RBC: 3.65 10*6/uL — ABNORMAL LOW (ref 3.70–5.32)
RDW: 14 % (ref 11.1–15.7)

## 2012-03-22 LAB — IRON AND TIBC
%SAT: 39 % (ref 20–55)
UIBC: 173 ug/dL (ref 125–400)

## 2012-03-22 LAB — FERRITIN: Ferritin: 50 ng/mL (ref 10–291)

## 2012-03-22 MED ORDER — HYDROCODONE-ACETAMINOPHEN 5-500 MG PO TABS: 1.0000 | ORAL_TABLET | Freq: Four times a day (QID) | ORAL | Status: DC | PRN

## 2012-03-22 MED ORDER — FULVESTRANT 250 MG/5ML IM SOLN
500.0000 mg | Freq: Once | INTRAMUSCULAR | Status: AC
  Administered 2012-03-22: 500 mg via INTRAMUSCULAR
  Filled 2012-03-22: qty 10

## 2012-03-22 MED ORDER — CYANOCOBALAMIN 1000 MCG/ML IJ SOLN
1000.0000 ug | Freq: Once | INTRAMUSCULAR | Status: AC
  Administered 2012-03-22: 1000 ug via INTRAMUSCULAR

## 2012-03-22 NOTE — Patient Instructions (Signed)
Fulvestrant injection What is this medicine? FULVESTRANT (ful VES trant) blocks the effects of estrogen. It is used to treat breast cancer in women past the age of menopause. This medicine may be used for other purposes; ask your health care provider or pharmacist if you have questions. What should I tell my health care provider before I take this medicine? They need to know if you have any of these conditions: -bleeding problems -liver disease -low levels of platelets in the blood -an unusual or allergic reaction to fulvestrant, other medicines, foods, dyes, or preservatives -pregnant or trying to get pregnant -breast-feeding How should I use this medicine? This medicine is for injection into a muscle. It is usually given by a health care professional in a hospital or clinic setting. Talk to your pediatrician regarding the use of this medicine in children. Special care may be needed. Overdosage: If you think you have taken too much of this medicine contact a poison control center or emergency room at once. NOTE: This medicine is only for you. Do not share this medicine with others. What if I miss a dose? It is important not to miss your dose. Call your doctor or health care professional if you are unable to keep an appointment. What may interact with this medicine? -medicines that treat or prevent blood clots like warfarin, enoxaparin, and dalteparin This list may not describe all possible interactions. Give your health care provider a list of all the medicines, herbs, non-prescription drugs, or dietary supplements you use. Also tell them if you smoke, drink alcohol, or use illegal drugs. Some items may interact with your medicine. What should I watch for while using this medicine? Your condition will be monitored carefully while you are receiving this medicine. You will need important blood work done while you are taking this medicine. Do not become pregnant while taking this medicine.  Women should inform their doctor if they wish to become pregnant or think they might be pregnant. There is a potential for serious side effects to an unborn child. Talk to your health care professional or pharmacist for more information. What side effects may I notice from receiving this medicine? Side effects that you should report to your doctor or health care professional as soon as possible: -allergic reactions like skin rash, itching or hives, swelling of the face, lips, or tongue -feeling faint or lightheaded, falls -fever or flu-like symptoms -sore throat -vaginal bleeding Side effects that usually do not require medical attention (report to your doctor or health care professional if they continue or are bothersome): -aches, pains -constipation or diarrhea -headache -hot flashes -nausea, vomiting -pain at site where injected -stomach pain This list may not describe all possible side effects. Call your doctor for medical advice about side effects. You may report side effects to FDA at 1-800-FDA-1088. Where should I keep my medicine? This drug is given in a hospital or clinic and will not be stored at home. NOTE: This sheet is a summary. It may not cover all possible information. If you have questions about this medicine, talk to your doctor, pharmacist, or health care provider.  2012, Elsevier/Gold Standard. (08/26/2007 3:39:24 PM)Cyanocobalamin, Vitamin B12 injection What is this medicine? CYANOCOBALAMIN (sye an oh koe BAL a min) is a man made form of vitamin B12. Vitamin B12 is used in the growth of healthy blood cells, nerve cells, and proteins in the body. It also helps with the metabolism of fats and carbohydrates. This medicine is used to treat people who  can not absorb vitamin B12. This medicine may be used for other purposes; ask your health care provider or pharmacist if you have questions. What should I tell my health care provider before I take this medicine? They need to  know if you have any of these conditions: -kidney disease -Leber's disease -megaloblastic anemia -an unusual or allergic reaction to cyanocobalamin, cobalt, other medicines, foods, dyes, or preservatives -pregnant or trying to get pregnant -breast-feeding How should I use this medicine? This medicine is injected into a muscle or deeply under the skin. It is usually given by a health care professional in a clinic or doctor's office. However, your doctor may teach you how to inject yourself. Follow all instructions. Talk to your pediatrician regarding the use of this medicine in children. Special care may be needed. Overdosage: If you think you have taken too much of this medicine contact a poison control center or emergency room at once. NOTE: This medicine is only for you. Do not share this medicine with others. What if I miss a dose? If you are given your dose at a clinic or doctor's office, call to reschedule your appointment. If you give your own injections and you miss a dose, take it as soon as you can. If it is almost time for your next dose, take only that dose. Do not take double or extra doses. What may interact with this medicine? -colchicine -heavy alcohol intake This list may not describe all possible interactions. Give your health care provider a list of all the medicines, herbs, non-prescription drugs, or dietary supplements you use. Also tell them if you smoke, drink alcohol, or use illegal drugs. Some items may interact with your medicine. What should I watch for while using this medicine? Visit your doctor or health care professional regularly. You may need blood work done while you are taking this medicine. You may need to follow a special diet. Talk to your doctor. Limit your alcohol intake and avoid smoking to get the best benefit. What side effects may I notice from receiving this medicine? Side effects that you should report to your doctor or health care professional as  soon as possible: -allergic reactions like skin rash, itching or hives, swelling of the face, lips, or tongue -blue tint to skin -chest tightness, pain -difficulty breathing, wheezing -dizziness -red, swollen painful area on the leg Side effects that usually do not require medical attention (report to your doctor or health care professional if they continue or are bothersome): -diarrhea -headache This list may not describe all possible side effects. Call your doctor for medical advice about side effects. You may report side effects to FDA at 1-800-FDA-1088. Where should I keep my medicine? Keep out of the reach of children. Store at room temperature between 15 and 30 degrees C (59 and 85 degrees F). Protect from light. Throw away any unused medicine after the expiration date. NOTE: This sheet is a summary. It may not cover all possible information. If you have questions about this medicine, talk to your doctor, pharmacist, or health care provider.  2012, Elsevier/Gold Standard. (07/29/2007 10:10:20 PM)

## 2012-03-22 NOTE — Telephone Encounter (Signed)
Per pt they will be gone the month of December. Pet scan MD visit is 1-15 and 1-17. PA aware and said this was ok.

## 2012-03-22 NOTE — Progress Notes (Signed)
Diagnoses: #1.  Metastatic breast cancer-solitary liver metastasis-status post ablation. 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 #3, vitamin B12 deficiency, secondary to gastric bypass. #4 severe rheumatoid arthritis. #5 intermittent iron deficiency anemia.  Current therapy: #1 patient is status post RFA for the liver.  Lesion. #2 patient is status post right thyroid lobectomy. #3, Faslodex 500 mg IM monthly. #4, vitamin B12 1 mg IM monthly.  Interim history: [name] presents today for an office followup visit.  Her daughter accompanies her.  Overall, she, reports, that she's been doing relatively well.  She did undergo a radiofrequency ablation of the liver.  Metastases.  Extensive workup, did not show any other lesions.  She recently had a CT scan of the abdomen, and pelvis with no appreciable residual tumor observed.  She does have multiple midline ventral hernias.  Bilateral nonobstructive, nephrolithiasis, and lumbar spondylosis and scoliosis.  She does continue to followup with Dr. Gerrit Friends as she is status post a right thyroid lobectomy.  She remains on Faslodex 500 mg IM monthly.  She does notice some arthralgias from this.  She has requested a prescription for Vicodin.  She remains on vitamin B12 1 mg monthly.  She does have a few hot flashes, but tolerable.  She does want to have hernia surgery at the beginning of next year.  She does have rheumatoid arthritis.  She does see several different specialist.  She reports, that she has a good appetite.  She denies any nausea, vomiting, diarrhea, constipation, any chest pain, shortness of breath, or cough.  She denies any fevers, chills, or night sweats.  She denies any palpable adenopathy.  She denies any headaches, visual changes, or rashes.  She denies any abdominal pain.  She denies any obvious, or abnormal bleeding or bruising.  Her last CA 27.29, was 26.  We do need to repeat a PET scan on her.   She reports, that she's going out of town the month of December, but will be back early January.  We will go ahead and get the PET scan scheduled for January.  We also follow her along for her.  Vitamin B12 deficiency and intermittent iron deficiency anemia.  She continues on vitamin B12 injections monthly.  She does have a history of gastric bypass.  The last, time, she received IV iron was back in February.  Her last iron panel revealed an iron of 116, with 41% saturation.  Ferritin was 93.  Review of Systems: Constitutional:Negative for malaise/fatigue, fever, chills, weight loss, diaphoresis, activity change, appetite change, and unexpected weight change.  HEENT: Negative for double vision, blurred vision, visual loss, ear pain, tinnitus, congestion, rhinorrhea, epistaxis sore throat or sinus disease, oral pain/lesion, tongue soreness Respiratory: Negative for cough, chest tightness, shortness of breath, wheezing and stridor.  Cardiovascular: Negative for chest pain, palpitations, leg swelling, orthopnea, PND, DOE or claudication Gastrointestinal: Negative for nausea, vomiting, abdominal pain, diarrhea, constipation, blood in stool, melena, hematochezia, abdominal distention, anal bleeding, rectal pain, anorexia and hematemesis.  Genitourinary: Negative for dysuria, frequency, hematuria,  Musculoskeletal: Negative for myalgias, back pain, joint swelling, arthralgias and gait problem.  Skin: Negative for rash, color change, pallor and wound.  Neurological:. Negative for dizziness/light-headedness, tremors, seizures, syncope, facial asymmetry, speech difficulty, weakness, numbness, headaches and paresthesias.  Hematological: Negative for adenopathy. Does not bruise/bleed easily.  Psychiatric/Behavioral:  Negative for depression, no loss of interest in normal activity or change in sleep pattern.   Physical Exam: This is  an obese, 56 year old, white female, in no obvious distress Vitals:  Temperature 90.2 degrees, pulse 69, respirations 16, blood pressure 155/78, weight 284 pounds HEENT reveals a normocephalic, atraumatic skull, no scleral icterus, no oral lesions  Neck is supple without any cervical or supraclavicular adenopathy.  Lungs are clear to auscultation bilaterally. There are no wheezes, rales or rhonci Cardiac is regular rate and rhythm with a normal S1 and S2. There are no murmurs, rubs, or bruits.  Abdomen is soft with good bowel sounds, there is no palpable mass. There is no palpable hepatosplenomegaly. There is no palpable fluid wave.  Musculoskeletal no tenderness of the spine, ribs, or hips.  Extremities there are no clubbing, cyanosis, or edema.  Skin no petechia, purpura or ecchymosis Neurologic is nonfocal.  Laboratory Data: White count 6.8, hemoglobin 1.8, hematocrit 36.1, platelets 218,000  Current Outpatient Prescriptions on File Prior to Visit  Medication Sig Dispense Refill  . B Complex-C (B-COMPLEX WITH VITAMIN C) tablet Take 1 tablet by mouth daily.      . bisacodyl (BISACODYL) 5 MG EC tablet Take 10 mg by mouth every evening. 2 5 mg tabs at night      . carvedilol (COREG) 12.5 MG tablet Take 12.5 mg by mouth 2 (two) times daily.      . Cholecalciferol (VITAMIN D) 2000 UNITS tablet Take 10,000 Units by mouth 3 (three) times a week. Monday Thursday and sunday      . FLUoxetine (PROZAC) 40 MG capsule Take 40 mg by mouth daily before breakfast.       . folic acid (FOLVITE) 1 MG tablet Take 2 mg by mouth daily.       . fulvestrant (FASLODEX) 250 MG/5ML injection Inject 250 mg into the muscle every 30 (thirty) days. One injection each buttock over 1-2 minutes. Warm prior to use.      . levothyroxine (SYNTHROID, LEVOTHROID) 200 MCG tablet Take 200 mcg by mouth daily before breakfast.      . lidocaine (XYLOCAINE) 5 % ointment Apply 1 application topically as needed. For leg and back pain      . metaxalone (SKELAXIN) 800 MG tablet Take 800 mg by mouth 4  (four) times daily.      . Multiple Vitamin (MULTIVITAMIN WITH MINERALS) TABS Take 1 tablet by mouth daily.      Marland Kitchen oxyCODONE-acetaminophen (PERCOCET) 10-325 MG per tablet Take 1 tablet by mouth every 6 (six) hours as needed. For pain      . predniSONE (DELTASONE) 5 MG tablet Take 15 mg by mouth every morning. Takes 3 tablets      . promethazine (PHENERGAN) 25 MG tablet Take 25 mg by mouth every 6 (six) hours as needed. nausea       Assessment/Plan: This is a pleasant, 57 year old, white female, with the following issues:  #1.  Metastatic breast cancer.  She did have a solitary liver metastasis.  This was treated with radiofrequency ablation.  She is now on Faslodex.  She is tolerating this relatively well.  She does have some minor arthralgias.  We will go ahead and get a PET scan set up for January.  #2.  Hurthle cell neoplasm of the thyroid.  She is followed by Dr. Gerrit Friends.  She is status post a right thyroid lobectomy.  #3.  Vitamin B12 deficiency.  This is secondary to gastric bypass.  She will continue on monthly vitamin B12 injections.  #4.  Intermittent iron deficiency anemia.  We will continue to monitor this and  provide IV iron as needed.  #5.  Followup.  We will follow back up with her in January, after her PET scan, but before then should there be questions or concerns.

## 2012-03-25 ENCOUNTER — Telehealth: Payer: Self-pay | Admitting: *Deleted

## 2012-03-25 NOTE — Telephone Encounter (Addendum)
Message copied by Wynonia Hazard on Mon Mar 25, 2012  1:20 PM ------      Message from: Arlan Organ R      Created: Mon Mar 25, 2012  7:36 AM       Call her daughter and let her that the CT scan does not show any active cancer in the liver. Nothing new is elsewhere. Her lab tests look fine. Iron studies are okay. Thanks. Pete  03/25/12 - Spoke to pt's dgtr. Gave her the above message.

## 2012-04-01 ENCOUNTER — Telehealth (INDEPENDENT_AMBULATORY_CARE_PROVIDER_SITE_OTHER): Payer: Self-pay

## 2012-04-01 NOTE — Telephone Encounter (Signed)
LMOM to call back and let us know if she has had her TSH done. Was due Nov 26 but I cannot locate result in system.

## 2012-04-01 NOTE — Telephone Encounter (Signed)
TSH results received from cancer center. To Dr Gerrit Friends for review.

## 2012-04-17 ENCOUNTER — Other Ambulatory Visit (HOSPITAL_BASED_OUTPATIENT_CLINIC_OR_DEPARTMENT_OTHER): Payer: Self-pay

## 2012-04-18 ENCOUNTER — Other Ambulatory Visit: Payer: Self-pay | Admitting: Lab

## 2012-04-18 ENCOUNTER — Ambulatory Visit: Payer: Self-pay

## 2012-04-18 ENCOUNTER — Ambulatory Visit: Payer: Self-pay | Admitting: Hematology & Oncology

## 2012-04-29 ENCOUNTER — Telehealth: Payer: Self-pay | Admitting: Hematology & Oncology

## 2012-04-29 NOTE — Telephone Encounter (Signed)
Patient's daughter called and cx 05/17/12 apt and resch for 05/21/12

## 2012-05-06 ENCOUNTER — Encounter (INDEPENDENT_AMBULATORY_CARE_PROVIDER_SITE_OTHER): Payer: Medicare Other | Admitting: Surgery

## 2012-05-06 ENCOUNTER — Encounter (HOSPITAL_COMMUNITY): Payer: Medicare Other

## 2012-05-07 ENCOUNTER — Ambulatory Visit: Payer: Self-pay

## 2012-05-07 ENCOUNTER — Ambulatory Visit: Payer: Self-pay | Admitting: Hematology & Oncology

## 2012-05-07 ENCOUNTER — Other Ambulatory Visit: Payer: Self-pay | Admitting: Lab

## 2012-05-15 ENCOUNTER — Other Ambulatory Visit (HOSPITAL_BASED_OUTPATIENT_CLINIC_OR_DEPARTMENT_OTHER): Payer: Self-pay

## 2012-05-17 ENCOUNTER — Ambulatory Visit: Payer: Self-pay | Admitting: Hematology & Oncology

## 2012-05-17 ENCOUNTER — Ambulatory Visit: Payer: Self-pay

## 2012-05-17 ENCOUNTER — Other Ambulatory Visit: Payer: Self-pay | Admitting: Lab

## 2012-05-21 ENCOUNTER — Other Ambulatory Visit: Payer: Self-pay | Admitting: Lab

## 2012-05-21 ENCOUNTER — Encounter (HOSPITAL_COMMUNITY)
Admission: RE | Admit: 2012-05-21 | Discharge: 2012-05-21 | Disposition: A | Discharge status: Home / Self Care | Payer: Medicare Other | Source: Ambulatory Visit | Attending: Medical | Admitting: Medical

## 2012-05-21 ENCOUNTER — Ambulatory Visit: Payer: Self-pay

## 2012-05-21 ENCOUNTER — Ambulatory Visit: Admission: RE | Admit: 2012-05-21 | Payer: Self-pay | Source: Ambulatory Visit

## 2012-05-21 ENCOUNTER — Encounter: Payer: Self-pay | Admitting: Hematology & Oncology

## 2012-05-21 DIAGNOSIS — C50919 Malignant neoplasm of unspecified site of unspecified female breast: Secondary | ICD-10-CM | POA: Insufficient documentation

## 2012-05-21 LAB — GLUCOSE, CAPILLARY: Glucose-Capillary: 82 mg/dL (ref 70–99)

## 2012-05-21 MED ORDER — FLUDEOXYGLUCOSE F - 18 (FDG) INJECTION
16.9000 | Freq: Once | INTRAVENOUS | Status: AC | PRN
  Administered 2012-05-21: 16.9 via INTRAVENOUS

## 2012-05-23 ENCOUNTER — Telehealth: Payer: Self-pay | Admitting: Hematology & Oncology

## 2012-05-23 NOTE — Telephone Encounter (Signed)
Patient called and cx 05/24/12 apt and resch for 05/27/12

## 2012-05-24 ENCOUNTER — Ambulatory Visit: Payer: Self-pay

## 2012-05-24 ENCOUNTER — Other Ambulatory Visit: Payer: Self-pay | Admitting: Lab

## 2012-05-24 ENCOUNTER — Ambulatory Visit: Payer: Self-pay | Admitting: Hematology & Oncology

## 2012-05-27 ENCOUNTER — Other Ambulatory Visit (HOSPITAL_BASED_OUTPATIENT_CLINIC_OR_DEPARTMENT_OTHER): Payer: Medicare Other | Admitting: Lab

## 2012-05-27 ENCOUNTER — Ambulatory Visit (HOSPITAL_BASED_OUTPATIENT_CLINIC_OR_DEPARTMENT_OTHER): Payer: Medicare Other | Admitting: Hematology & Oncology

## 2012-05-27 ENCOUNTER — Ambulatory Visit (HOSPITAL_BASED_OUTPATIENT_CLINIC_OR_DEPARTMENT_OTHER): Payer: Medicare Other

## 2012-05-27 VITALS — BP 148/74 | Ht 66.0 in | Wt 288.0 lb

## 2012-05-27 DIAGNOSIS — C787 Secondary malignant neoplasm of liver and intrahepatic bile duct: Secondary | ICD-10-CM

## 2012-05-27 DIAGNOSIS — E538 Deficiency of other specified B group vitamins: Secondary | ICD-10-CM

## 2012-05-27 DIAGNOSIS — Z5112 Encounter for antineoplastic immunotherapy: Secondary | ICD-10-CM | POA: Diagnosis not present

## 2012-05-27 DIAGNOSIS — C50919 Malignant neoplasm of unspecified site of unspecified female breast: Secondary | ICD-10-CM

## 2012-05-27 DIAGNOSIS — E039 Hypothyroidism, unspecified: Secondary | ICD-10-CM | POA: Diagnosis not present

## 2012-05-27 DIAGNOSIS — D510 Vitamin B12 deficiency anemia due to intrinsic factor deficiency: Secondary | ICD-10-CM

## 2012-05-27 DIAGNOSIS — C50519 Malignant neoplasm of lower-outer quadrant of unspecified female breast: Secondary | ICD-10-CM

## 2012-05-27 LAB — IRON AND TIBC
%SAT: 29 % (ref 20–55)
TIBC: 265 ug/dL (ref 250–470)

## 2012-05-27 LAB — COMPREHENSIVE METABOLIC PANEL
ALT: 18 U/L (ref 0–35)
AST: 19 U/L (ref 0–37)
Alkaline Phosphatase: 64 U/L (ref 39–117)
CO2: 26 mEq/L (ref 19–32)
Sodium: 142 mEq/L (ref 135–145)
Total Bilirubin: 0.4 mg/dL (ref 0.3–1.2)
Total Protein: 6 g/dL (ref 6.0–8.3)

## 2012-05-27 LAB — FERRITIN: Ferritin: 57 ng/mL (ref 10–291)

## 2012-05-27 LAB — CBC WITH DIFFERENTIAL (CANCER CENTER ONLY)
BASO%: 0.3 % (ref 0.0–2.0)
EOS%: 0.7 % (ref 0.0–7.0)
LYMPH#: 0.8 10*3/uL — ABNORMAL LOW (ref 0.9–3.3)
LYMPH%: 13.3 % — ABNORMAL LOW (ref 14.0–48.0)
MCHC: 32.3 g/dL (ref 32.0–36.0)
MCV: 99 fL (ref 81–101)
MONO#: 0.3 10*3/uL (ref 0.1–0.9)
Platelets: 221 10*3/uL (ref 145–400)
RDW: 13.1 % (ref 11.1–15.7)
WBC: 6.1 10*3/uL (ref 3.9–10.0)

## 2012-05-27 LAB — TSH: TSH: 0.643 u[IU]/mL (ref 0.350–4.500)

## 2012-05-27 LAB — CANCER ANTIGEN 27.29: CA 27.29: 20 U/mL (ref 0–39)

## 2012-05-27 MED ORDER — CYANOCOBALAMIN 1000 MCG/ML IJ SOLN
1000.0000 ug | Freq: Once | INTRAMUSCULAR | Status: AC
  Administered 2012-05-27: 1000 ug via INTRAMUSCULAR

## 2012-05-27 MED ORDER — FULVESTRANT 250 MG/5ML IM SOLN
500.0000 mg | Freq: Once | INTRAMUSCULAR | Status: AC
  Administered 2012-05-27: 500 mg via INTRAMUSCULAR
  Filled 2012-05-27: qty 10

## 2012-05-27 NOTE — Patient Instructions (Signed)
Fulvestrant injection What is this medicine? FULVESTRANT (ful VES trant) blocks the effects of estrogen. It is used to treat breast cancer in women past the age of menopause. This medicine may be used for other purposes; ask your health care provider or pharmacist if you have questions. What should I tell my health care provider before I take this medicine? They need to know if you have any of these conditions: -bleeding problems -liver disease -low levels of platelets in the blood -an unusual or allergic reaction to fulvestrant, other medicines, foods, dyes, or preservatives -pregnant or trying to get pregnant -breast-feeding How should I use this medicine? This medicine is for injection into a muscle. It is usually given by a health care professional in a hospital or clinic setting. Talk to your pediatrician regarding the use of this medicine in children. Special care may be needed. Overdosage: If you think you have taken too much of this medicine contact a poison control center or emergency room at once. NOTE: This medicine is only for you. Do not share this medicine with others. What if I miss a dose? It is important not to miss your dose. Call your doctor or health care professional if you are unable to keep an appointment. What may interact with this medicine? -medicines that treat or prevent blood clots like warfarin, enoxaparin, and dalteparin This list may not describe all possible interactions. Give your health care provider a list of all the medicines, herbs, non-prescription drugs, or dietary supplements you use. Also tell them if you smoke, drink alcohol, or use illegal drugs. Some items may interact with your medicine. What should I watch for while using this medicine? Your condition will be monitored carefully while you are receiving this medicine. You will need important blood work done while you are taking this medicine. Do not become pregnant while taking this medicine.  Women should inform their doctor if they wish to become pregnant or think they might be pregnant. There is a potential for serious side effects to an unborn child. Talk to your health care professional or pharmacist for more information. What side effects may I notice from receiving this medicine? Side effects that you should report to your doctor or health care professional as soon as possible: -allergic reactions like skin rash, itching or hives, swelling of the face, lips, or tongue -feeling faint or lightheaded, falls -fever or flu-like symptoms -sore throat -vaginal bleeding Side effects that usually do not require medical attention (report to your doctor or health care professional if they continue or are bothersome): -aches, pains -constipation or diarrhea -headache -hot flashes -nausea, vomiting -pain at site where injected -stomach pain This list may not describe all possible side effects. Call your doctor for medical advice about side effects. You may report side effects to FDA at 1-800-FDA-1088. Where should I keep my medicine? This drug is given in a hospital or clinic and will not be stored at home. NOTE: This sheet is a summary. It may not cover all possible information. If you have questions about this medicine, talk to your doctor, pharmacist, or health care provider.  2012, Elsevier/Gold Standard. (08/26/2007 3:39:24 PM)Cyanocobalamin, Vitamin B12 injection What is this medicine? CYANOCOBALAMIN (sye an oh koe BAL a min) is a man made form of vitamin B12. Vitamin B12 is used in the growth of healthy blood cells, nerve cells, and proteins in the body. It also helps with the metabolism of fats and carbohydrates. This medicine is used to treat people who  can not absorb vitamin B12. This medicine may be used for other purposes; ask your health care provider or pharmacist if you have questions. What should I tell my health care provider before I take this medicine? They need to  know if you have any of these conditions: -kidney disease -Leber's disease -megaloblastic anemia -an unusual or allergic reaction to cyanocobalamin, cobalt, other medicines, foods, dyes, or preservatives -pregnant or trying to get pregnant -breast-feeding How should I use this medicine? This medicine is injected into a muscle or deeply under the skin. It is usually given by a health care professional in a clinic or doctor's office. However, your doctor may teach you how to inject yourself. Follow all instructions. Talk to your pediatrician regarding the use of this medicine in children. Special care may be needed. Overdosage: If you think you have taken too much of this medicine contact a poison control center or emergency room at once. NOTE: This medicine is only for you. Do not share this medicine with others. What if I miss a dose? If you are given your dose at a clinic or doctor's office, call to reschedule your appointment. If you give your own injections and you miss a dose, take it as soon as you can. If it is almost time for your next dose, take only that dose. Do not take double or extra doses. What may interact with this medicine? -colchicine -heavy alcohol intake This list may not describe all possible interactions. Give your health care provider a list of all the medicines, herbs, non-prescription drugs, or dietary supplements you use. Also tell them if you smoke, drink alcohol, or use illegal drugs. Some items may interact with your medicine. What should I watch for while using this medicine? Visit your doctor or health care professional regularly. You may need blood work done while you are taking this medicine. You may need to follow a special diet. Talk to your doctor. Limit your alcohol intake and avoid smoking to get the best benefit. What side effects may I notice from receiving this medicine? Side effects that you should report to your doctor or health care professional as  soon as possible: -allergic reactions like skin rash, itching or hives, swelling of the face, lips, or tongue -blue tint to skin -chest tightness, pain -difficulty breathing, wheezing -dizziness -red, swollen painful area on the leg Side effects that usually do not require medical attention (report to your doctor or health care professional if they continue or are bothersome): -diarrhea -headache This list may not describe all possible side effects. Call your doctor for medical advice about side effects. You may report side effects to FDA at 1-800-FDA-1088. Where should I keep my medicine? Keep out of the reach of children. Store at room temperature between 15 and 30 degrees C (59 and 85 degrees F). Protect from light. Throw away any unused medicine after the expiration date. NOTE: This sheet is a summary. It may not cover all possible information. If you have questions about this medicine, talk to your doctor, pharmacist, or health care provider.  2012, Elsevier/Gold Standard. (07/29/2007 10:10:20 PM)   Cyanocobalamin, Vitamin B12 injection What is this medicine? CYANOCOBALAMIN (sye an oh koe BAL a min) is a man made form of vitamin B12. Vitamin B12 is used in the growth of healthy blood cells, nerve cells, and proteins in the body. It also helps with the metabolism of fats and carbohydrates. This medicine is used to treat people who can not absorb vitamin  B12. This medicine may be used for other purposes; ask your health care provider or pharmacist if you have questions. What should I tell my health care provider before I take this medicine? They need to know if you have any of these conditions: -kidney disease -Leber's disease -megaloblastic anemia -an unusual or allergic reaction to cyanocobalamin, cobalt, other medicines, foods, dyes, or preservatives -pregnant or trying to get pregnant -breast-feeding How should I use this medicine? This medicine is injected into a muscle or  deeply under the skin. It is usually given by a health care professional in a clinic or doctor's office. However, your doctor may teach you how to inject yourself. Follow all instructions. Talk to your pediatrician regarding the use of this medicine in children. Special care may be needed. Overdosage: If you think you have taken too much of this medicine contact a poison control center or emergency room at once. NOTE: This medicine is only for you. Do not share this medicine with others. What if I miss a dose? If you are given your dose at a clinic or doctor's office, call to reschedule your appointment. If you give your own injections and you miss a dose, take it as soon as you can. If it is almost time for your next dose, take only that dose. Do not take double or extra doses. What may interact with this medicine? -colchicine -heavy alcohol intake This list may not describe all possible interactions. Give your health care provider a list of all the medicines, herbs, non-prescription drugs, or dietary supplements you use. Also tell them if you smoke, drink alcohol, or use illegal drugs. Some items may interact with your medicine. What should I watch for while using this medicine? Visit your doctor or health care professional regularly. You may need blood work done while you are taking this medicine. You may need to follow a special diet. Talk to your doctor. Limit your alcohol intake and avoid smoking to get the best benefit. What side effects may I notice from receiving this medicine? Side effects that you should report to your doctor or health care professional as soon as possible: -allergic reactions like skin rash, itching or hives, swelling of the face, lips, or tongue -blue tint to skin -chest tightness, pain -difficulty breathing, wheezing -dizziness -red, swollen painful area on the leg Side effects that usually do not require medical attention (report to your doctor or health care  professional if they continue or are bothersome): -diarrhea -headache This list may not describe all possible side effects. Call your doctor for medical advice about side effects. You may report side effects to FDA at 1-800-FDA-1088. Where should I keep my medicine? Keep out of the reach of children. Store at room temperature between 15 and 30 degrees C (59 and 85 degrees F). Protect from light. Throw away any unused medicine after the expiration date. NOTE: This sheet is a summary. It may not cover all possible information. If you have questions about this medicine, talk to your doctor, pharmacist, or health care provider.  2012, Elsevier/Gold Standard. (07/29/2007 10:10:20 PM)

## 2012-05-27 NOTE — Progress Notes (Signed)
This office note has been dictated.

## 2012-05-28 DIAGNOSIS — M48061 Spinal stenosis, lumbar region without neurogenic claudication: Secondary | ICD-10-CM | POA: Diagnosis not present

## 2012-05-28 DIAGNOSIS — G609 Hereditary and idiopathic neuropathy, unspecified: Secondary | ICD-10-CM | POA: Diagnosis not present

## 2012-05-28 DIAGNOSIS — M069 Rheumatoid arthritis, unspecified: Secondary | ICD-10-CM | POA: Diagnosis not present

## 2012-05-28 NOTE — Progress Notes (Signed)
DIAGNOSIS: 1. Metastatic breast cancer-solitary liver metastasis. 2. 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 3. Severe rheumatoid arthritis. 4. Iron-deficiency anemia. 5. B12 deficiency. 6. History of gastric bypass.  CURRENT THERAPY: 1. Faslodex 500 mg IM monthly. 2. Vitamin B12 1 mg IM monthly. 3. IV iron as indicated.  INTERIM HISTORY:  [name] comes in for her followup.  She actually has been down in [place] for 2 months.  She and her daughter went down to [place].  [place] were [place] the [place].  [name] daughter's best friend was living down there with her father who is sick.  As such, she has not had any therapy since November.  We did go ahead and repeat a PET scan on her.  This was done on the 21st.  The PET scan did not show any evidence of metastatic disease or progression.  [name] is itching.  She is on prednisone for her rheumatoid arthritis.  Her rheumatoid arthritis is probably her biggest problem right now.  Her last CA27.29 back in November was 28 and holding steady.  Her last iron studies showed a ferritin of 50 and an iron saturation of 39% back in November.  PHYSICAL EXAMINATION:  General:  This is an obese white female in no obvious distress.  Vital signs:  Temperature of 97.9, pulse 64, respiratory rate 18, blood pressure 148/74.  Weight is 288.  Head and neck:  Normocephalic, atraumatic skull.  There are no ocular or oral lesions.  There are no palpable cervical or supraclavicular lymph nodes. Lungs:  Clear to percussion and auscultation bilaterally.  Cardiac: Regular rate and rhythm with a normal S1 and S2.  There are no murmurs, rubs, or bruits.  Abdomen:  Soft with good bowel sounds.  She does have a ventral wall hernia.  She has a scar in the right upper quadrant. There is no palpable hepatomegaly.  There is no fluid wave.  Back:  No tenderness over the spine,  ribs, or hips.  Extremities:  Some trace edema of the legs.  Skin:  Some areas of excoriation from itching.  LABORATORY STUDIES:  White cell count is 6.1, hemoglobin 10.9, hematocrit 33.7, platelet count is 221.  IMPRESSION:  [name] is a 57 year old white female with metastatic breast cancer.  This was diagnosed via biopsy.  She had a liver metastasis.  She actually underwent an ablation for this.  She has no evidence of disease right now.  As such, I still think that we can continue with the Faslodex.  I believe that this is helping [place].  We will also support her with B12 and iron.  She will get B12 today.  We will plan to get her back in another month for followup.  I do not think we need any additional scans on her probably for another 3 months.    ______________________________ Josph Macho, M.D. PRE/MEDQ  D:  05/27/2012  T:  05/28/2012  Job:  [id number]

## 2012-05-31 ENCOUNTER — Other Ambulatory Visit: Payer: Self-pay | Admitting: *Deleted

## 2012-05-31 ENCOUNTER — Telehealth: Payer: Self-pay | Admitting: *Deleted

## 2012-05-31 DIAGNOSIS — D510 Vitamin B12 deficiency anemia due to intrinsic factor deficiency: Secondary | ICD-10-CM

## 2012-05-31 NOTE — Telephone Encounter (Signed)
Called daughter Byrd Hesselbach to make an appt for low iron levels with Feraheme 1020 mg

## 2012-05-31 NOTE — Telephone Encounter (Signed)
Message copied by Anselm Jungling on Fri May 31, 2012 11:47 AM ------      Message from: Arlan Organ R      Created: Wed May 29, 2012  9:24 PM       Call her dgtr - iron is a little low. Need 1 dose of Feraheme at 1020mg .  Please set up!!!  Cindee Lame

## 2012-06-07 ENCOUNTER — Ambulatory Visit: Payer: Self-pay

## 2012-06-10 ENCOUNTER — Ambulatory Visit: Payer: Self-pay

## 2012-06-10 NOTE — Progress Notes (Signed)
Patient did not show up for her appt.

## 2012-06-24 ENCOUNTER — Ambulatory Visit: Payer: Self-pay

## 2012-06-24 ENCOUNTER — Ambulatory Visit: Payer: Self-pay | Admitting: Medical

## 2012-06-24 ENCOUNTER — Other Ambulatory Visit: Payer: Self-pay | Admitting: Lab

## 2012-06-24 ENCOUNTER — Telehealth: Payer: Self-pay | Admitting: Hematology & Oncology

## 2012-06-24 NOTE — Telephone Encounter (Signed)
2-24 moved to 3-5 daughter is sick.

## 2012-07-02 ENCOUNTER — Other Ambulatory Visit: Payer: Self-pay | Admitting: Medical

## 2012-07-02 DIAGNOSIS — D509 Iron deficiency anemia, unspecified: Secondary | ICD-10-CM

## 2012-07-02 DIAGNOSIS — C50919 Malignant neoplasm of unspecified site of unspecified female breast: Secondary | ICD-10-CM

## 2012-07-03 ENCOUNTER — Ambulatory Visit (HOSPITAL_BASED_OUTPATIENT_CLINIC_OR_DEPARTMENT_OTHER): Payer: Medicare Other

## 2012-07-03 ENCOUNTER — Ambulatory Visit (HOSPITAL_BASED_OUTPATIENT_CLINIC_OR_DEPARTMENT_OTHER): Payer: Medicare Other | Admitting: Medical

## 2012-07-03 ENCOUNTER — Other Ambulatory Visit (HOSPITAL_BASED_OUTPATIENT_CLINIC_OR_DEPARTMENT_OTHER): Payer: Medicare Other | Admitting: Lab

## 2012-07-03 ENCOUNTER — Other Ambulatory Visit: Payer: Self-pay | Admitting: *Deleted

## 2012-07-03 VITALS — BP 173/91 | HR 112 | Temp 98.3°F | Resp 16 | Ht 66.0 in | Wt 284.0 lb

## 2012-07-03 DIAGNOSIS — C50519 Malignant neoplasm of lower-outer quadrant of unspecified female breast: Secondary | ICD-10-CM

## 2012-07-03 DIAGNOSIS — C50919 Malignant neoplasm of unspecified site of unspecified female breast: Secondary | ICD-10-CM | POA: Diagnosis not present

## 2012-07-03 DIAGNOSIS — Z5111 Encounter for antineoplastic chemotherapy: Secondary | ICD-10-CM | POA: Diagnosis not present

## 2012-07-03 DIAGNOSIS — D509 Iron deficiency anemia, unspecified: Secondary | ICD-10-CM | POA: Diagnosis not present

## 2012-07-03 DIAGNOSIS — M069 Rheumatoid arthritis, unspecified: Secondary | ICD-10-CM | POA: Diagnosis not present

## 2012-07-03 DIAGNOSIS — E538 Deficiency of other specified B group vitamins: Secondary | ICD-10-CM

## 2012-07-03 DIAGNOSIS — D510 Vitamin B12 deficiency anemia due to intrinsic factor deficiency: Secondary | ICD-10-CM | POA: Diagnosis not present

## 2012-07-03 LAB — IRON AND TIBC
Iron: 79 ug/dL (ref 42–145)
TIBC: 338 ug/dL (ref 250–470)
UIBC: 259 ug/dL (ref 125–400)

## 2012-07-03 LAB — FERRITIN: Ferritin: 68 ng/mL (ref 10–291)

## 2012-07-03 LAB — CBC WITH DIFFERENTIAL (CANCER CENTER ONLY)
BASO#: 0.1 10*3/uL (ref 0.0–0.2)
BASO%: 0.9 % (ref 0.0–2.0)
EOS%: 2.7 % (ref 0.0–7.0)
HGB: 12.4 g/dL (ref 11.6–15.9)
LYMPH#: 1.5 10*3/uL (ref 0.9–3.3)
MCHC: 33.1 g/dL (ref 32.0–36.0)
NEUT#: 3.7 10*3/uL (ref 1.5–6.5)

## 2012-07-03 LAB — CMP (CANCER CENTER ONLY)
BUN, Bld: 16 mg/dL (ref 7–22)
CO2: 26 mEq/L (ref 18–33)
Creat: 0.9 mg/dl (ref 0.6–1.2)
Glucose, Bld: 92 mg/dL (ref 73–118)
Total Bilirubin: 0.5 mg/dl (ref 0.20–1.60)

## 2012-07-03 MED ORDER — CYANOCOBALAMIN 1000 MCG/ML IJ SOLN
1000.0000 ug | Freq: Once | INTRAMUSCULAR | Status: AC
  Administered 2012-07-03: 1000 ug via INTRAMUSCULAR

## 2012-07-03 MED ORDER — PROMETHAZINE HCL 25 MG PO TABS: 25.0000 mg | ORAL_TABLET | Freq: Four times a day (QID) | ORAL | Status: DC | PRN

## 2012-07-03 MED ORDER — FULVESTRANT 250 MG/5ML IM SOLN
500.0000 mg | Freq: Once | INTRAMUSCULAR | Status: AC
  Administered 2012-07-03: 500 mg via INTRAMUSCULAR
  Filled 2012-07-03: qty 10

## 2012-07-03 MED ORDER — OXYCODONE-ACETAMINOPHEN 10-325 MG PO TABS: 1.0000 | ORAL_TABLET | Freq: Four times a day (QID) | ORAL | Status: DC | PRN

## 2012-07-03 NOTE — Progress Notes (Signed)
DIAGNOSIS: 1. Metastatic breast cancer-solitary liver metastasis. 2. 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 3. Severe rheumatoid arthritis. 4. Iron-deficiency anemia. 5. B12 deficiency. 6. History of gastric bypass.  CURRENT THERAPY: 1. Faslodex 500 mg IM monthly. 2. Vitamin B12 1 mg IM monthly. 3. IV iron as indicated.  INTERIM HISTORY: Ms. Bubba Hales presents today for an office followup visit.  Her daughter accompanies her.  We did repeat a PET scan.  Back on January 21, which did not show any evidence of metastatic disease or progression.  She remains on Faslodex.  She does report a lot of joint aches, and requested Percocet.  She also reports, quite a bit of nausea, however, she states this has been a chronic problem.  I did advise her that she really needs to find a primary care physician, and possibly, a gastroenterologist here in the area.  She does have rheumatoid arthritis in this is probably her biggest problem.  Her last CA 27.29, was 20.  Her latest iron studies were bit on the lower side.  She did have to cancel her appointment for IV iron as she had the flu.  We will go ahead and get IV iron.  Set up next week.  She does receive monthly B12 injections.  She otherwise reports, that she has a decent, appetite.  She denies any active, vomiting, diarrhea, constipation, chest pain, shortness of breath, or cough.  She denies any fevers, chills, or night sweats.  She denies any obvious, or normal, bleeding.  She denies any lower leg swelling.  She denies any headaches, visual changes, or rashes.  She does have lymphedema of the right arm.  Review of Systems: Constitutional:Negative for malaise/fatigue, fever, chills, weight loss, diaphoresis, activity change, appetite change, and unexpected weight change.  HEENT: Negative for double vision, blurred vision, visual loss, ear pain, tinnitus, congestion, rhinorrhea, epistaxis sore throat or sinus  disease, oral pain/lesion, tongue soreness Respiratory: Negative for cough, chest tightness, shortness of breath, wheezing and stridor.  Cardiovascular: Negative for chest pain, palpitations, leg swelling, orthopnea, PND, DOE or claudication Gastrointestinal: Negative for nausea, vomiting, abdominal pain, diarrhea, constipation, blood in stool, melena, hematochezia, abdominal distention, anal bleeding, rectal pain, anorexia and hematemesis.  Genitourinary: Negative for dysuria, frequency, hematuria,  Musculoskeletal: Negative for myalgias, back pain, joint swelling, arthralgias and gait problem.  Skin: Negative for rash, color change, pallor and wound.  Neurological:. Negative for dizziness/light-headedness, tremors, seizures, syncope, facial asymmetry, speech difficulty, weakness, numbness, headaches and paresthesias.  Hematological: Negative for adenopathy. Does not bruise/bleed easily.  Psychiatric/Behavioral:  Negative for depression, no loss of interest in normal activity or change in sleep pattern.   Physical Exam: This is an obese, white, 57 year old, female, in no obvious distress Vitals: Temperature 90.3 degrees, pulse 112, respirations 16, blood pressure 173, her 91, weight 282 pounds HEENT reveals a normocephalic, atraumatic skull, no scleral icterus, no oral lesions  Neck is supple without any cervical or supraclavicular adenopathy.  Lungs are clear to auscultation bilaterally. There are no wheezes, rales or rhonci Cardiac is regular rate and rhythm with a normal S1 and S2. There are no murmurs, rubs, or bruits.  Abdomen is soft with good bowel sounds, there is no palpable mass. There is no palpable hepatosplenomegaly. There is no palpable fluid wave.  Musculoskeletal no tenderness of the spine, ribs, or hips.  Extremities there are no clubbing, cyanosis, or edema.  She does have some lymphedema of the right arm.  Skin no petechia, purpura or ecchymosis Neurologic is  nonfocal.  Laboratory Data: White count 5.8, hemoglobin 12.4, hematocrit 37.5.  Platelets 253,000  Current Outpatient Prescriptions on File Prior to Visit  Medication Sig Dispense Refill  . B Complex-C (B-COMPLEX WITH VITAMIN C) tablet Take 1 tablet by mouth daily.      . bisacodyl (BISACODYL) 5 MG EC tablet Take 10 mg by mouth every evening. 2 5 mg tabs at night      . carvedilol (COREG) 12.5 MG tablet Take 12.5 mg by mouth 2 (two) times daily.      . Cholecalciferol (VITAMIN D) 2000 UNITS tablet Take 10,000 Units by mouth 3 (three) times a week. Monday Thursday and sunday      . FLUoxetine (PROZAC) 40 MG capsule Take 40 mg by mouth daily before breakfast.       . folic acid (FOLVITE) 1 MG tablet Take 2 mg by mouth daily.       . fulvestrant (FASLODEX) 250 MG/5ML injection Inject 250 mg into the muscle every 30 (thirty) days. One injection each buttock over 1-2 minutes. Warm prior to use.      . levothyroxine (SYNTHROID, LEVOTHROID) 200 MCG tablet Take 200 mcg by mouth daily before breakfast.      . lidocaine (XYLOCAINE) 5 % ointment Apply 1 application topically as needed. For leg and back pain      . metaxalone (SKELAXIN) 800 MG tablet Take 800 mg by mouth 4 (four) times daily.      . Multiple Vitamin (MULTIVITAMIN WITH MINERALS) TABS Take 1 tablet by mouth daily.      Marland Kitchen oxyCODONE-acetaminophen (PERCOCET) 10-325 MG per tablet Take 1 tablet by mouth every 6 (six) hours as needed. For pain      . predniSONE (DELTASONE) 5 MG tablet Take 15 mg by mouth every morning. Takes 3 tablets      . promethazine (PHENERGAN) 25 MG tablet Take 25 mg by mouth every 6 (six) hours as needed. nausea       No current facility-administered medications on file prior to visit.   Assessment/Plan: This is a 57 year old, white female, with the following issues:  #1.  Metastatic breast cancer.  This was diagnosed by biopsy.  She had a liver metastasis.  She underwent an ablation for this.  She currently has no  evidence of disease.  At this time.  We will continue with Faslodex.   #2.  B12 deficiency.  She will continue monthly B12 injections.  #3.  Iron deficiency anemia.  We will go ahead and set her up to come back next week.  For IV iron.  We will continue to monitor her iron panel.  #4.  Nausea.  I'm not sure what this is coming from.  She is requesting Phenergan.  I advised her that she needs to find a primary care physician, and, that we could not continue to keep prescribing her Phenergan.  #5.  Joint pain.  She does have rheumatoid arthritis.  Again, she is requesting Percocet.  I did advise her to find a local primary care physician, as well as a rheumatologist.  I will go ahead and give her prescription for Percocet.  Today, but this will be the last time.  #6.  Followup.  We will plan to see her back in another month, but before then should there be questions or concerns.

## 2012-07-03 NOTE — Patient Instructions (Signed)
Fulvestrant injection What is this medicine? FULVESTRANT (ful VES trant) blocks the effects of estrogen. It is used to treat breast cancer in women past the age of menopause. This medicine may be used for other purposes; ask your health care provider or pharmacist if you have questions. What should I tell my health care provider before I take this medicine? They need to know if you have any of these conditions: -bleeding problems -liver disease -low levels of platelets in the blood -an unusual or allergic reaction to fulvestrant, other medicines, foods, dyes, or preservatives -pregnant or trying to get pregnant -breast-feeding How should I use this medicine? This medicine is for injection into a muscle. It is usually given by a health care professional in a hospital or clinic setting. Talk to your pediatrician regarding the use of this medicine in children. Special care may be needed. Overdosage: If you think you have taken too much of this medicine contact a poison control center or emergency room at once. NOTE: This medicine is only for you. Do not share this medicine with others. What if I miss a dose? It is important not to miss your dose. Call your doctor or health care professional if you are unable to keep an appointment. What may interact with this medicine? -medicines that treat or prevent blood clots like warfarin, enoxaparin, and dalteparin This list may not describe all possible interactions. Give your health care provider a list of all the medicines, herbs, non-prescription drugs, or dietary supplements you use. Also tell them if you smoke, drink alcohol, or use illegal drugs. Some items may interact with your medicine. What should I watch for while using this medicine? Your condition will be monitored carefully while you are receiving this medicine. You will need important blood work done while you are taking this medicine. Do not become pregnant while taking this medicine.  Women should inform their doctor if they wish to become pregnant or think they might be pregnant. There is a potential for serious side effects to an unborn child. Talk to your health care professional or pharmacist for more information. What side effects may I notice from receiving this medicine? Side effects that you should report to your doctor or health care professional as soon as possible: -allergic reactions like skin rash, itching or hives, swelling of the face, lips, or tongue -feeling faint or lightheaded, falls -fever or flu-like symptoms -sore throat -vaginal bleeding Side effects that usually do not require medical attention (report to your doctor or health care professional if they continue or are bothersome): -aches, pains -constipation or diarrhea -headache -hot flashes -nausea, vomiting -pain at site where injected -stomach pain This list may not describe all possible side effects. Call your doctor for medical advice about side effects. You may report side effects to FDA at 1-800-FDA-1088. Where should I keep my medicine? This drug is given in a hospital or clinic and will not be stored at home. NOTE: This sheet is a summary. It may not cover all possible information. If you have questions about this medicine, talk to your doctor, pharmacist, or health care provider.  2012, [employer]. (08/26/2007 3:39:24 PM)Cyanocobalamin, Vitamin B12 injection What is this medicine? CYANOCOBALAMIN (sye an oh koe BAL a min) is a man made form of vitamin B12. Vitamin B12 is used in the growth of healthy blood cells, nerve cells, and proteins in the body. It also helps with the metabolism of fats and carbohydrates. This medicine is used to treat people who  can not absorb vitamin B12. This medicine may be used for other purposes; ask your health care provider or pharmacist if you have questions. What should I tell my health care provider before I take this medicine? They need to  know if you have any of these conditions: -kidney disease -Leber's disease -megaloblastic anemia -an unusual or allergic reaction to cyanocobalamin, cobalt, other medicines, foods, dyes, or preservatives -pregnant or trying to get pregnant -breast-feeding How should I use this medicine? This medicine is injected into a muscle or deeply under the skin. It is usually given by a health care professional in a clinic or doctor's office. However, your doctor may teach you how to inject yourself. Follow all instructions. Talk to your pediatrician regarding the use of this medicine in children. Special care may be needed. Overdosage: If you think you have taken too much of this medicine contact a poison control center or emergency room at once. NOTE: This medicine is only for you. Do not share this medicine with others. What if I miss a dose? If you are given your dose at a clinic or doctor's office, call to reschedule your appointment. If you give your own injections and you miss a dose, take it as soon as you can. If it is almost time for your next dose, take only that dose. Do not take double or extra doses. What may interact with this medicine? -colchicine -heavy alcohol intake This list may not describe all possible interactions. Give your health care provider a list of all the medicines, herbs, non-prescription drugs, or dietary supplements you use. Also tell them if you smoke, drink alcohol, or use illegal drugs. Some items may interact with your medicine. What should I watch for while using this medicine? Visit your doctor or health care professional regularly. You may need blood work done while you are taking this medicine. You may need to follow a special diet. Talk to your doctor. Limit your alcohol intake and avoid smoking to get the best benefit. What side effects may I notice from receiving this medicine? Side effects that you should report to your doctor or health care professional as  soon as possible: -allergic reactions like skin rash, itching or hives, swelling of the face, lips, or tongue -blue tint to skin -chest tightness, pain -difficulty breathing, wheezing -dizziness -red, swollen painful area on the leg Side effects that usually do not require medical attention (report to your doctor or health care professional if they continue or are bothersome): -diarrhea -headache This list may not describe all possible side effects. Call your doctor for medical advice about side effects. You may report side effects to FDA at 1-800-FDA-1088. Where should I keep my medicine? Keep out of the reach of children. Store at room temperature between 15 and 30 degrees C (59 and 85 degrees F). Protect from light. Throw away any unused medicine after the expiration date. NOTE: This sheet is a summary. It may not cover all possible information. If you have questions about this medicine, talk to your doctor, pharmacist, or health care provider.  2012, [employer] Standard. (07/29/2007 10:10:20 PM)   Cyanocobalamin, Vitamin B12 injection What is this medicine? CYANOCOBALAMIN (sye an oh koe BAL a min) is a man made form of vitamin B12. Vitamin B12 is used in the growth of healthy blood cells, nerve cells, and proteins in the body. It also helps with the metabolism of fats and carbohydrates. This medicine is used to treat people who can not absorb vitamin  B12. This medicine may be used for other purposes; ask your health care provider or pharmacist if you have questions. What should I tell my health care provider before I take this medicine? They need to know if you have any of these conditions: -kidney disease -Leber's disease -megaloblastic anemia -an unusual or allergic reaction to cyanocobalamin, cobalt, other medicines, foods, dyes, or preservatives -pregnant or trying to get pregnant -breast-feeding How should I use this medicine? This medicine is injected into a muscle or  deeply under the skin. It is usually given by a health care professional in a clinic or doctor's office. However, your doctor may teach you how to inject yourself. Follow all instructions. Talk to your pediatrician regarding the use of this medicine in children. Special care may be needed. Overdosage: If you think you have taken too much of this medicine contact a poison control center or emergency room at once. NOTE: This medicine is only for you. Do not share this medicine with others. What if I miss a dose? If you are given your dose at a clinic or doctor's office, call to reschedule your appointment. If you give your own injections and you miss a dose, take it as soon as you can. If it is almost time for your next dose, take only that dose. Do not take double or extra doses. What may interact with this medicine? -colchicine -heavy alcohol intake This list may not describe all possible interactions. Give your health care provider a list of all the medicines, herbs, non-prescription drugs, or dietary supplements you use. Also tell them if you smoke, drink alcohol, or use illegal drugs. Some items may interact with your medicine. What should I watch for while using this medicine? Visit your doctor or health care professional regularly. You may need blood work done while you are taking this medicine. You may need to follow a special diet. Talk to your doctor. Limit your alcohol intake and avoid smoking to get the best benefit. What side effects may I notice from receiving this medicine? Side effects that you should report to your doctor or health care professional as soon as possible: -allergic reactions like skin rash, itching or hives, swelling of the face, lips, or tongue -blue tint to skin -chest tightness, pain -difficulty breathing, wheezing -dizziness -red, swollen painful area on the leg Side effects that usually do not require medical attention (report to your doctor or health care  professional if they continue or are bothersome): -diarrhea -headache This list may not describe all possible side effects. Call your doctor for medical advice about side effects. You may report side effects to FDA at 1-800-FDA-1088. Where should I keep my medicine? Keep out of the reach of children. Store at room temperature between 15 and 30 degrees C (59 and 85 degrees F). Protect from light. Throw away any unused medicine after the expiration date. NOTE: This sheet is a summary. It may not cover all possible information. If you have questions about this medicine, talk to your doctor, pharmacist, or health care provider.  2012, [employer] Standard. (07/29/2007 10:10:20 PM)   Cyanocobalamin, Vitamin B12 injection What is this medicine? CYANOCOBALAMIN (sye an oh koe BAL a min) is a man made form of vitamin B12. Vitamin B12 is used in the growth of healthy blood cells, nerve cells, and proteins in the body. It also helps with the metabolism of fats and carbohydrates. This medicine is used to treat people who can not absorb vitamin B12. This medicine may  be used for other purposes; ask your health care provider or pharmacist if you have questions. What should I tell my health care provider before I take this medicine? They need to know if you have any of these conditions: -kidney disease -Leber's disease -megaloblastic anemia -an unusual or allergic reaction to cyanocobalamin, cobalt, other medicines, foods, dyes, or preservatives -pregnant or trying to get pregnant -breast-feeding How should I use this medicine? This medicine is injected into a muscle or deeply under the skin. It is usually given by a health care professional in a clinic or doctor's office. However, your doctor may teach you how to inject yourself. Follow all instructions. Talk to your pediatrician regarding the use of this medicine in children. Special care may be needed. Overdosage: If you think you have taken too much  of this medicine contact a poison control center or emergency room at once. NOTE: This medicine is only for you. Do not share this medicine with others. What if I miss a dose? If you are given your dose at a clinic or doctor's office, call to reschedule your appointment. If you give your own injections and you miss a dose, take it as soon as you can. If it is almost time for your next dose, take only that dose. Do not take double or extra doses. What may interact with this medicine? -colchicine -heavy alcohol intake This list may not describe all possible interactions. Give your health care provider a list of all the medicines, herbs, non-prescription drugs, or dietary supplements you use. Also tell them if you smoke, drink alcohol, or use illegal drugs. Some items may interact with your medicine. What should I watch for while using this medicine? Visit your doctor or health care professional regularly. You may need blood work done while you are taking this medicine. You may need to follow a special diet. Talk to your doctor. Limit your alcohol intake and avoid smoking to get the best benefit. What side effects may I notice from receiving this medicine? Side effects that you should report to your doctor or health care professional as soon as possible: -allergic reactions like skin rash, itching or hives, swelling of the face, lips, or tongue -blue tint to skin -chest tightness, pain -difficulty breathing, wheezing -dizziness -red, swollen painful area on the leg Side effects that usually do not require medical attention (report to your doctor or health care professional if they continue or are bothersome): -diarrhea -headache This list may not describe all possible side effects. Call your doctor for medical advice about side effects. You may report side effects to FDA at 1-800-FDA-1088. Where should I keep my medicine? Keep out of the reach of children. Store at room temperature between 15  and 30 degrees C (59 and 85 degrees F). Protect from light. Throw away any unused medicine after the expiration date. NOTE: This sheet is a summary. It may not cover all possible information. If you have questions about this medicine, talk to your doctor, pharmacist, or health care provider.  2013, [employer]. (07/29/2007 10:10:20 PM)

## 2012-07-11 ENCOUNTER — Ambulatory Visit (HOSPITAL_BASED_OUTPATIENT_CLINIC_OR_DEPARTMENT_OTHER): Payer: Medicare Other

## 2012-07-11 VITALS — Temp 97.5°F | Resp 18

## 2012-07-11 DIAGNOSIS — D509 Iron deficiency anemia, unspecified: Secondary | ICD-10-CM | POA: Diagnosis not present

## 2012-07-11 DIAGNOSIS — D510 Vitamin B12 deficiency anemia due to intrinsic factor deficiency: Secondary | ICD-10-CM

## 2012-07-11 MED ORDER — SODIUM CHLORIDE 0.9 % IV SOLN
1020.0000 mg | Freq: Once | INTRAVENOUS | Status: AC
  Administered 2012-07-11: 1020 mg via INTRAVENOUS
  Filled 2012-07-11: qty 34

## 2012-07-11 MED ORDER — SODIUM CHLORIDE 0.9 % IV SOLN
Freq: Once | INTRAVENOUS | Status: AC
  Administered 2012-07-11: 09:00:00 via INTRAVENOUS

## 2012-07-11 NOTE — Patient Instructions (Signed)
Ferumoxytol injection What is this medicine? FERUMOXYTOL is an iron complex. Iron is used to make healthy red blood cells, which carry oxygen and nutrients throughout the body. This medicine is used to treat iron deficiency anemia in people with chronic kidney disease. This medicine may be used for other purposes; ask your health care provider or pharmacist if you have questions. What should I tell my health care provider before I take this medicine? They need to know if you have any of these conditions: -anemia not caused by low iron levels -high levels of iron in the blood -magnetic resonance imaging (MRI) test scheduled -an unusual or allergic reaction to iron, other medicines, foods, dyes, or preservatives -pregnant or trying to get pregnant -breast-feeding How should I use this medicine? This medicine is for infusion into a vein. It is given by a health care professional in a hospital or clinic setting. Talk to your pediatrician regarding the use of this medicine in children. Special care may be needed. Overdosage: If you think you've taken too much of this medicine contact a poison control center or emergency room at once. Overdosage: If you think you have taken too much of this medicine contact a poison control center or emergency room at once. NOTE: This medicine is only for you. Do not share this medicine with others. What if I miss a dose? It is important not to miss your dose. Call your doctor or health care professional if you are unable to keep an appointment. What may interact with this medicine? This medicine may interact with the following medications: -other iron products This list may not describe all possible interactions. Give your health care provider a list of all the medicines, herbs, non-prescription drugs, or dietary supplements you use. Also tell them if you smoke, drink alcohol, or use illegal drugs. Some items may interact with your medicine. What should I watch  for while using this medicine? Visit your doctor or healthcare professional regularly. Tell your doctor or healthcare professional if your symptoms do not start to get better or if they get worse. You may need blood work done while you are taking this medicine. You may need to follow a special diet. Talk to your doctor. Foods that contain iron include: whole grains/cereals, dried fruits, beans, or peas, leafy green vegetables, and organ meats (liver, kidney). What side effects may I notice from receiving this medicine? Side effects that you should report to your doctor or health care professional as soon as possible: -allergic reactions like skin rash, itching or hives, swelling of the face, lips, or tongue -breathing problems -changes in blood pressure -feeling faint or lightheaded, falls -fever or chills -flushing, sweating, or hot feelings -swelling of the ankles or feet Side effects that usually do not require medical attention (Report these to your doctor or health care professional if they continue or are bothersome.): -diarrhea -headache -nausea, vomiting -stomach pain This list may not describe all possible side effects. Call your doctor for medical advice about side effects. You may report side effects to FDA at 1-800-FDA-1088. Where should I keep my medicine? This drug is given in a hospital or clinic and will not be stored at home. NOTE: This sheet is a summary. It may not cover all possible information. If you have questions about this medicine, talk to your doctor, pharmacist, or health care provider.  2012, Elsevier/Gold Standard. (01/08/2008 9:48:25 PM) 

## 2012-07-12 ENCOUNTER — Telehealth: Payer: Self-pay | Admitting: *Deleted

## 2012-07-12 NOTE — Telephone Encounter (Signed)
Left message on patients personal cell phone to tell her that her labwork all looked good per dr.ennever

## 2012-07-12 NOTE — Telephone Encounter (Signed)
Message copied by Anselm Jungling on Fri Jul 12, 2012  9:54 AM ------      Message from: Arlan Organ R      Created: Thu Jul 11, 2012  4:43 PM       Call her daughter - labs look good!!  Consulting civil engineer ------

## 2012-07-29 ENCOUNTER — Telehealth: Payer: Self-pay | Admitting: Hematology & Oncology

## 2012-07-29 DIAGNOSIS — M069 Rheumatoid arthritis, unspecified: Secondary | ICD-10-CM | POA: Diagnosis not present

## 2012-07-29 DIAGNOSIS — M48061 Spinal stenosis, lumbar region without neurogenic claudication: Secondary | ICD-10-CM | POA: Diagnosis not present

## 2012-07-29 DIAGNOSIS — M25569 Pain in unspecified knee: Secondary | ICD-10-CM | POA: Diagnosis not present

## 2012-07-29 NOTE — Telephone Encounter (Signed)
Patient called and cx 08/13/12 and resch for 08/08/12

## 2012-08-08 ENCOUNTER — Other Ambulatory Visit: Payer: Self-pay | Admitting: Lab

## 2012-08-08 ENCOUNTER — Telehealth: Payer: Self-pay | Admitting: Hematology & Oncology

## 2012-08-08 ENCOUNTER — Ambulatory Visit: Payer: Self-pay | Admitting: Hematology & Oncology

## 2012-08-08 ENCOUNTER — Ambulatory Visit: Payer: Self-pay

## 2012-08-08 NOTE — Progress Notes (Unsigned)
Pt no-showed for her appt today but dgtr called later on this afternoon to re-schedule. Due to their time restrictions on when she can come the 1st available appt is in May. Reviewed with Dr [name]. This is ok with him.

## 2012-08-08 NOTE — Telephone Encounter (Signed)
Amy RN is aware of 4-14 missed inj and 5-13 reschedule

## 2012-08-08 NOTE — Telephone Encounter (Signed)
Pt rescheduled missed 4-14 to 5-13 her daugther is having surgery 4-11 and due to recovery time and needed after 9 am and before 230 pm pt chose 5-13

## 2012-08-13 ENCOUNTER — Ambulatory Visit: Payer: Self-pay

## 2012-08-13 ENCOUNTER — Other Ambulatory Visit: Payer: Self-pay | Admitting: Lab

## 2012-08-13 ENCOUNTER — Ambulatory Visit: Payer: Self-pay | Admitting: Hematology & Oncology

## 2012-08-28 ENCOUNTER — Other Ambulatory Visit: Payer: Self-pay | Admitting: Radiology

## 2012-09-10 ENCOUNTER — Ambulatory Visit (HOSPITAL_BASED_OUTPATIENT_CLINIC_OR_DEPARTMENT_OTHER): Payer: Medicare Other

## 2012-09-10 ENCOUNTER — Ambulatory Visit (HOSPITAL_BASED_OUTPATIENT_CLINIC_OR_DEPARTMENT_OTHER): Payer: Medicare Other | Admitting: Hematology & Oncology

## 2012-09-10 ENCOUNTER — Other Ambulatory Visit (HOSPITAL_BASED_OUTPATIENT_CLINIC_OR_DEPARTMENT_OTHER): Payer: Medicare Other | Admitting: Lab

## 2012-09-10 VITALS — BP 153/70 | HR 57 | Temp 98.1°F | Resp 16 | Ht 66.0 in | Wt 286.0 lb

## 2012-09-10 DIAGNOSIS — Z5111 Encounter for antineoplastic chemotherapy: Secondary | ICD-10-CM | POA: Diagnosis not present

## 2012-09-10 DIAGNOSIS — M545 Low back pain: Secondary | ICD-10-CM

## 2012-09-10 DIAGNOSIS — D510 Vitamin B12 deficiency anemia due to intrinsic factor deficiency: Secondary | ICD-10-CM

## 2012-09-10 DIAGNOSIS — E039 Hypothyroidism, unspecified: Secondary | ICD-10-CM

## 2012-09-10 DIAGNOSIS — C787 Secondary malignant neoplasm of liver and intrahepatic bile duct: Secondary | ICD-10-CM

## 2012-09-10 DIAGNOSIS — D509 Iron deficiency anemia, unspecified: Secondary | ICD-10-CM

## 2012-09-10 DIAGNOSIS — C50919 Malignant neoplasm of unspecified site of unspecified female breast: Secondary | ICD-10-CM | POA: Diagnosis not present

## 2012-09-10 DIAGNOSIS — E538 Deficiency of other specified B group vitamins: Secondary | ICD-10-CM | POA: Diagnosis not present

## 2012-09-10 DIAGNOSIS — M069 Rheumatoid arthritis, unspecified: Secondary | ICD-10-CM

## 2012-09-10 DIAGNOSIS — C50911 Malignant neoplasm of unspecified site of right female breast: Secondary | ICD-10-CM

## 2012-09-10 DIAGNOSIS — C50519 Malignant neoplasm of lower-outer quadrant of unspecified female breast: Secondary | ICD-10-CM | POA: Diagnosis not present

## 2012-09-10 DIAGNOSIS — C50912 Malignant neoplasm of unspecified site of left female breast: Secondary | ICD-10-CM

## 2012-09-10 LAB — IRON AND TIBC
%SAT: 22 % (ref 20–55)
Iron: 44 ug/dL (ref 42–145)
TIBC: 199 ug/dL — ABNORMAL LOW (ref 250–470)
UIBC: 155 ug/dL (ref 125–400)

## 2012-09-10 LAB — CBC WITH DIFFERENTIAL (CANCER CENTER ONLY)
BASO#: 0 10*3/uL (ref 0.0–0.2)
EOS%: 3.9 % (ref 0.0–7.0)
Eosinophils Absolute: 0.3 10*3/uL (ref 0.0–0.5)
HGB: 11.5 g/dL — ABNORMAL LOW (ref 11.6–15.9)
LYMPH%: 23 % (ref 14.0–48.0)
MCH: 33.5 pg (ref 26.0–34.0)
MCHC: 32.6 g/dL (ref 32.0–36.0)
MCV: 103 fL — ABNORMAL HIGH (ref 81–101)
MONO%: 8 % (ref 0.0–13.0)
RBC: 3.43 10*6/uL — ABNORMAL LOW (ref 3.70–5.32)

## 2012-09-10 LAB — COMPREHENSIVE METABOLIC PANEL
AST: 16 U/L (ref 0–37)
Alkaline Phosphatase: 84 U/L (ref 39–117)
BUN: 14 mg/dL (ref 6–23)
Creatinine, Ser: 1.02 mg/dL (ref 0.50–1.10)
Glucose, Bld: 74 mg/dL (ref 70–99)
Total Bilirubin: 0.4 mg/dL (ref 0.3–1.2)

## 2012-09-10 LAB — CANCER ANTIGEN 27.29: CA 27.29: 25 U/mL (ref 0–39)

## 2012-09-10 LAB — TSH: TSH: 47.683 u[IU]/mL — ABNORMAL HIGH (ref 0.350–4.500)

## 2012-09-10 MED ORDER — CYANOCOBALAMIN 1000 MCG/ML IJ SOLN
1000.0000 ug | Freq: Once | INTRAMUSCULAR | Status: AC
  Administered 2012-09-10: 1000 ug via INTRAMUSCULAR

## 2012-09-10 MED ORDER — FULVESTRANT 250 MG/5ML IM SOLN
500.0000 mg | Freq: Once | INTRAMUSCULAR | Status: AC
  Administered 2012-09-10: 500 mg via INTRAMUSCULAR
  Filled 2012-09-10: qty 10

## 2012-09-10 NOTE — Progress Notes (Signed)
CC:   [name], MD  DIAGNOSES: 1. 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 2. 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 3. 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 4. Severe rheumatoid arthritis.  CURRENT THERAPY: 1. Faslodex 500 mg IM q.month. 2. Vitamin B12 1 mg IM q.month. 3. IV iron as indicated, the patient last received back in March.  INTERIM HISTORY:  [name] comes in for her followup.  She was last seen back in March.  She seems to be doing okay.  She does feel quite tired.  She did have iron back in March.  At that point in time her ferritin was 68 with iron saturation of 23%.  She is on Synthroid.  She is on 0.2 mg.  We are checking a TSH on her.  Her last CA27.29 was 27.  She last had a PET scan back in January.  PET scan showed no evidence of residual disease in her liver, status post RFA.  She does have a severe rheumatoid arthritis.  She is not happy at all with the pain doctor that she is seeing.  We will see about referring her to another pain physician.  PHYSICAL EXAMINATION:  General:  This is an obese white female in no obvious distress.  Vital signs:  Show temperature of 98, pulse 57, respiratory rate 16, blood pressure 150/70.  Weight is 286.  Head and neck:  Shows a normocephalic, atraumatic skull.  There are no ocular or oral lesions.  There are no palpable cervical or supraclavicular lymph nodes.  Lungs:  Clear bilaterally.  Cardiac:  Regular rate and rhythm with a normal S1 and S2.  There are no murmurs, rubs or bruits. Abdomen:  Soft, mildly obese.  She does have the abdominal wall hernia. She has no fluid wave.  There is no guarding or rebound tenderness. There is no palpable hepatosplenomegaly.  Extremities:  Show no clubbing, cyanosis or edema.  Neurological:  Shows no focal neurological deficits.  LABORATORY STUDIES:  White cell count is 7, hemoglobin 11.5,  hematocrit 35.3, platelet count 206.  IMPRESSION:  [name] is a 57 year old white female with metastatic breast cancer.  Again, she had a solitary liver metastasis.  This was treated with RFA.  Her last PET scan did not show any residual activity.  We do have to do another PET scan on her.  We will go ahead with the B12 today.  I am not sure why she is feeling tired.  I would not think that this would be from iron deficiency.  She just got iron back in March.  We will see about making a referral to another pain physician. Hopefully, this will be a little bit more productive for her so she can have good pain management.  We will go ahead and plan to get her back in 1 more month for followup.    ______________________________ Josph Macho, M.D. PRE/MEDQ  D:  09/10/2012  T:  09/10/2012  Job:  [id number]

## 2012-09-10 NOTE — Progress Notes (Signed)
This office note has been dictated.

## 2012-09-11 ENCOUNTER — Telehealth: Payer: Self-pay | Admitting: Hematology & Oncology

## 2012-09-11 NOTE — Telephone Encounter (Signed)
Called Dr Vear Clock office to refer pt for pain clinic. They said they would fax referral information to [place].

## 2012-09-12 ENCOUNTER — Telehealth: Payer: Self-pay | Admitting: Hematology & Oncology

## 2012-09-12 ENCOUNTER — Other Ambulatory Visit: Payer: Self-pay | Admitting: *Deleted

## 2012-09-12 ENCOUNTER — Telehealth: Payer: Self-pay | Admitting: *Deleted

## 2012-09-12 DIAGNOSIS — C50919 Malignant neoplasm of unspecified site of unspecified female breast: Secondary | ICD-10-CM

## 2012-09-12 MED ORDER — LEVOTHYROXINE SODIUM 300 MCG PO TABS: 300.0000 ug | ORAL_TABLET | Freq: Every day | ORAL | Status: DC

## 2012-09-12 MED ORDER — PROMETHAZINE HCL 25 MG PO TABS: 25.0000 mg | ORAL_TABLET | Freq: Four times a day (QID) | ORAL | Status: DC | PRN

## 2012-09-12 NOTE — Telephone Encounter (Signed)
Message copied by Anselm Jungling on Thu Sep 12, 2012  1:41 PM ------      Message from: Arlan Organ R      Created: Wed Sep 11, 2012  7:18 AM       Please call her daughter and tell her that her mother's thyroid function is very low. She needs to increase her Synthroid to 300 mcg by mouth daily. Thanks. Pete ------

## 2012-09-12 NOTE — Telephone Encounter (Signed)
Faxed referral to [facility].

## 2012-09-12 NOTE — Telephone Encounter (Signed)
Pt aware of 6-4 PET and to be NPO 6 hrs prior and also 6-10 MD appointment

## 2012-09-12 NOTE — Telephone Encounter (Signed)
Called patient daughter to let her know to increase her mothers Synthroid to 300 mcg based on low thyroid function

## 2012-10-02 ENCOUNTER — Encounter (HOSPITAL_COMMUNITY): Payer: Medicare Other

## 2012-10-02 ENCOUNTER — Telehealth: Payer: Self-pay | Admitting: Hematology & Oncology

## 2012-10-02 NOTE — Telephone Encounter (Signed)
Called pain clinic to check on referral, spoke with Noreene Larsson she said pt is not in system yet. She transferred me to [place] who schedules new patients. I left message for Tresa Endo to call in regards to 5-15 referral faxed.

## 2012-10-03 ENCOUNTER — Ambulatory Visit (HOSPITAL_COMMUNITY): Payer: Medicare Other

## 2012-10-05 DIAGNOSIS — M51379 Other intervertebral disc degeneration, lumbosacral region without mention of lumbar back pain or lower extremity pain: Secondary | ICD-10-CM | POA: Insufficient documentation

## 2012-10-05 DIAGNOSIS — M069 Rheumatoid arthritis, unspecified: Secondary | ICD-10-CM | POA: Insufficient documentation

## 2012-10-05 DIAGNOSIS — M5137 Other intervertebral disc degeneration, lumbosacral region: Secondary | ICD-10-CM | POA: Insufficient documentation

## 2012-10-08 ENCOUNTER — Telehealth: Payer: Self-pay | Admitting: Hematology & Oncology

## 2012-10-08 ENCOUNTER — Ambulatory Visit: Payer: Self-pay | Admitting: Hematology & Oncology

## 2012-10-08 ENCOUNTER — Other Ambulatory Visit: Payer: Self-pay | Admitting: Lab

## 2012-10-08 ENCOUNTER — Ambulatory Visit: Payer: Self-pay

## 2012-10-08 NOTE — Telephone Encounter (Signed)
Patient's daughter called and cx 10/08/12 apt and resch for 11/08/12.  Rn was notified of cx apt

## 2012-10-09 ENCOUNTER — Encounter (HOSPITAL_COMMUNITY): Payer: Medicare Other

## 2012-10-09 ENCOUNTER — Telehealth: Payer: Self-pay | Admitting: Hematology & Oncology

## 2012-10-09 NOTE — Telephone Encounter (Signed)
Pt aware of 7-11 time change

## 2012-10-16 DIAGNOSIS — R002 Palpitations: Secondary | ICD-10-CM | POA: Diagnosis not present

## 2012-10-16 DIAGNOSIS — R570 Cardiogenic shock: Secondary | ICD-10-CM | POA: Diagnosis not present

## 2012-10-16 DIAGNOSIS — I10 Essential (primary) hypertension: Secondary | ICD-10-CM | POA: Diagnosis not present

## 2012-10-16 DIAGNOSIS — C73 Malignant neoplasm of thyroid gland: Secondary | ICD-10-CM | POA: Diagnosis not present

## 2012-10-16 DIAGNOSIS — M5412 Radiculopathy, cervical region: Secondary | ICD-10-CM | POA: Diagnosis not present

## 2012-10-16 DIAGNOSIS — D493 Neoplasm of unspecified behavior of breast: Secondary | ICD-10-CM | POA: Diagnosis not present

## 2012-10-16 DIAGNOSIS — C50919 Malignant neoplasm of unspecified site of unspecified female breast: Secondary | ICD-10-CM | POA: Diagnosis not present

## 2012-10-16 DIAGNOSIS — Z0000 Encounter for general adult medical examination without abnormal findings: Secondary | ICD-10-CM | POA: Diagnosis not present

## 2012-10-16 DIAGNOSIS — E119 Type 2 diabetes mellitus without complications: Secondary | ICD-10-CM | POA: Diagnosis not present

## 2012-10-16 DIAGNOSIS — E786 Lipoprotein deficiency: Secondary | ICD-10-CM | POA: Diagnosis not present

## 2012-10-16 DIAGNOSIS — Z8249 Family history of ischemic heart disease and other diseases of the circulatory system: Secondary | ICD-10-CM | POA: Diagnosis not present

## 2012-10-23 ENCOUNTER — Encounter (HOSPITAL_COMMUNITY): Payer: Self-pay

## 2012-10-23 ENCOUNTER — Encounter (HOSPITAL_COMMUNITY)
Admission: RE | Admit: 2012-10-23 | Discharge: 2012-10-23 | Disposition: A | Discharge status: Home / Self Care | Payer: Medicare Other | Source: Ambulatory Visit | Attending: Hematology & Oncology | Admitting: Hematology & Oncology

## 2012-10-23 DIAGNOSIS — D509 Iron deficiency anemia, unspecified: Secondary | ICD-10-CM | POA: Insufficient documentation

## 2012-10-23 DIAGNOSIS — C50912 Malignant neoplasm of unspecified site of left female breast: Secondary | ICD-10-CM

## 2012-10-23 DIAGNOSIS — E039 Hypothyroidism, unspecified: Secondary | ICD-10-CM | POA: Diagnosis not present

## 2012-10-23 DIAGNOSIS — C50919 Malignant neoplasm of unspecified site of unspecified female breast: Secondary | ICD-10-CM | POA: Diagnosis not present

## 2012-10-23 LAB — GLUCOSE, CAPILLARY: Glucose-Capillary: 79 mg/dL (ref 70–99)

## 2012-10-23 MED ORDER — FLUDEOXYGLUCOSE F - 18 (FDG) INJECTION
18.2000 | Freq: Once | INTRAVENOUS | Status: AC | PRN
  Administered 2012-10-23: 18.2 via INTRAVENOUS

## 2012-10-24 ENCOUNTER — Telehealth: Payer: Self-pay | Admitting: *Deleted

## 2012-10-24 ENCOUNTER — Telehealth: Payer: Self-pay | Admitting: Hematology & Oncology

## 2012-10-24 NOTE — Telephone Encounter (Signed)
Talked with Tresa Endo at pain clinic they left pt for pt to call on 6-24 to schedule appointment. Tresa Endo is aware to call the [phone] number. Left RN message

## 2012-10-24 NOTE — Telephone Encounter (Addendum)
Message copied by Mirian Capuchin on Thu Oct 24, 2012  1:43 PM ------      Message from: Arlan Organ R      Created: Thu Oct 24, 2012  8:17 AM       Call - no active cancer on PET!! Kerri Vasquez ------This message given to pt.  Voiced understanding.

## 2012-10-25 DIAGNOSIS — M48061 Spinal stenosis, lumbar region without neurogenic claudication: Secondary | ICD-10-CM | POA: Diagnosis not present

## 2012-10-25 DIAGNOSIS — M25569 Pain in unspecified knee: Secondary | ICD-10-CM | POA: Diagnosis not present

## 2012-11-08 ENCOUNTER — Telehealth: Payer: Self-pay | Admitting: Hematology & Oncology

## 2012-11-08 ENCOUNTER — Ambulatory Visit (HOSPITAL_BASED_OUTPATIENT_CLINIC_OR_DEPARTMENT_OTHER): Payer: Medicare Other | Admitting: Hematology & Oncology

## 2012-11-08 ENCOUNTER — Ambulatory Visit (HOSPITAL_BASED_OUTPATIENT_CLINIC_OR_DEPARTMENT_OTHER): Payer: Medicare Other

## 2012-11-08 ENCOUNTER — Ambulatory Visit: Payer: Self-pay | Admitting: Hematology & Oncology

## 2012-11-08 ENCOUNTER — Ambulatory Visit: Payer: Self-pay

## 2012-11-08 ENCOUNTER — Other Ambulatory Visit: Payer: Self-pay | Admitting: Lab

## 2012-11-08 ENCOUNTER — Other Ambulatory Visit: Payer: Self-pay | Admitting: *Deleted

## 2012-11-08 ENCOUNTER — Other Ambulatory Visit (HOSPITAL_BASED_OUTPATIENT_CLINIC_OR_DEPARTMENT_OTHER): Payer: Medicare Other | Admitting: Lab

## 2012-11-08 VITALS — BP 111/69 | HR 82 | Temp 98.2°F | Resp 16 | Ht 66.0 in | Wt 287.0 lb

## 2012-11-08 DIAGNOSIS — Z5111 Encounter for antineoplastic chemotherapy: Secondary | ICD-10-CM | POA: Diagnosis not present

## 2012-11-08 DIAGNOSIS — C50519 Malignant neoplasm of lower-outer quadrant of unspecified female breast: Secondary | ICD-10-CM

## 2012-11-08 DIAGNOSIS — E538 Deficiency of other specified B group vitamins: Secondary | ICD-10-CM | POA: Diagnosis not present

## 2012-11-08 DIAGNOSIS — R599 Enlarged lymph nodes, unspecified: Secondary | ICD-10-CM

## 2012-11-08 DIAGNOSIS — C50919 Malignant neoplasm of unspecified site of unspecified female breast: Secondary | ICD-10-CM | POA: Diagnosis not present

## 2012-11-08 DIAGNOSIS — C787 Secondary malignant neoplasm of liver and intrahepatic bile duct: Secondary | ICD-10-CM

## 2012-11-08 DIAGNOSIS — E039 Hypothyroidism, unspecified: Secondary | ICD-10-CM

## 2012-11-08 DIAGNOSIS — C50419 Malignant neoplasm of upper-outer quadrant of unspecified female breast: Secondary | ICD-10-CM | POA: Diagnosis not present

## 2012-11-08 DIAGNOSIS — R110 Nausea: Secondary | ICD-10-CM

## 2012-11-08 DIAGNOSIS — C50912 Malignant neoplasm of unspecified site of left female breast: Secondary | ICD-10-CM

## 2012-11-08 DIAGNOSIS — M069 Rheumatoid arthritis, unspecified: Secondary | ICD-10-CM

## 2012-11-08 DIAGNOSIS — D509 Iron deficiency anemia, unspecified: Secondary | ICD-10-CM

## 2012-11-08 DIAGNOSIS — D649 Anemia, unspecified: Secondary | ICD-10-CM

## 2012-11-08 DIAGNOSIS — D510 Vitamin B12 deficiency anemia due to intrinsic factor deficiency: Secondary | ICD-10-CM

## 2012-11-08 LAB — CBC WITH DIFFERENTIAL (CANCER CENTER ONLY)
BASO#: 0 10*3/uL (ref 0.0–0.2)
EOS%: 3 % (ref 0.0–7.0)
Eosinophils Absolute: 0.2 10*3/uL (ref 0.0–0.5)
HGB: 11.2 g/dL — ABNORMAL LOW (ref 11.6–15.9)
LYMPH%: 30.3 % (ref 14.0–48.0)
MCH: 33 pg (ref 26.0–34.0)
MCHC: 32.5 g/dL (ref 32.0–36.0)
MCV: 102 fL — ABNORMAL HIGH (ref 81–101)
MONO%: 7.6 % (ref 0.0–13.0)
NEUT#: 3.1 10*3/uL (ref 1.5–6.5)
NEUT%: 58.5 % (ref 39.6–80.0)
RBC: 3.39 10*6/uL — ABNORMAL LOW (ref 3.70–5.32)

## 2012-11-08 LAB — CMP (CANCER CENTER ONLY)
Albumin: 2.9 g/dL — ABNORMAL LOW (ref 3.3–5.5)
BUN, Bld: 17 mg/dL (ref 7–22)
Calcium: 9.2 mg/dL (ref 8.0–10.3)
Chloride: 109 mEq/L — ABNORMAL HIGH (ref 98–108)
Glucose, Bld: 120 mg/dL — ABNORMAL HIGH (ref 73–118)
Potassium: 4.3 mEq/L (ref 3.3–4.7)
Sodium: 141 mEq/L (ref 128–145)
Total Protein: 6.3 g/dL — ABNORMAL LOW (ref 6.4–8.1)

## 2012-11-08 LAB — IRON AND TIBC CHCC
Iron: 72 ug/dL (ref 41–142)
TIBC: 218 ug/dL — ABNORMAL LOW (ref 236–444)

## 2012-11-08 MED ORDER — PROMETHAZINE HCL 25 MG PO TABS: 25.0000 mg | ORAL_TABLET | Freq: Four times a day (QID) | ORAL | Status: DC | PRN

## 2012-11-08 MED ORDER — FULVESTRANT 250 MG/5ML IM SOLN
500.0000 mg | Freq: Once | INTRAMUSCULAR | Status: AC
  Administered 2012-11-08: 500 mg via INTRAMUSCULAR
  Filled 2012-11-08: qty 10

## 2012-11-08 MED ORDER — CYANOCOBALAMIN 1000 MCG/ML IJ SOLN
1000.0000 ug | Freq: Once | INTRAMUSCULAR | Status: AC
  Administered 2012-11-08: 1000 ug via INTRAMUSCULAR

## 2012-11-08 NOTE — Patient Instructions (Signed)
Fulvestrant injection What is this medicine? FULVESTRANT (ful VES trant) blocks the effects of estrogen. It is used to treat breast cancer in women past the age of menopause. This medicine may be used for other purposes; ask your health care provider or pharmacist if you have questions. What should I tell my health care provider before I take this medicine? They need to know if you have any of these conditions: -bleeding problems -liver disease -low levels of platelets in the blood -an unusual or allergic reaction to fulvestrant, other medicines, foods, dyes, or preservatives -pregnant or trying to get pregnant -breast-feeding How should I use this medicine? This medicine is for injection into a muscle. It is usually given by a health care professional in a hospital or clinic setting. Talk to your pediatrician regarding the use of this medicine in children. Special care may be needed. Overdosage: If you think you have taken too much of this medicine contact a poison control center or emergency room at once. NOTE: This medicine is only for you. Do not share this medicine with others. What if I miss a dose? It is important not to miss your dose. Call your doctor or health care professional if you are unable to keep an appointment. What may interact with this medicine? -medicines that treat or prevent blood clots like warfarin, enoxaparin, and dalteparin This list may not describe all possible interactions. Give your health care provider a list of all the medicines, herbs, non-prescription drugs, or dietary supplements you use. Also tell them if you smoke, drink alcohol, or use illegal drugs. Some items may interact with your medicine. What should I watch for while using this medicine? Your condition will be monitored carefully while you are receiving this medicine. You will need important blood work done while you are taking this medicine. Do not become pregnant while taking this medicine.  Women should inform their doctor if they wish to become pregnant or think they might be pregnant. There is a potential for serious side effects to an unborn child. Talk to your health care professional or pharmacist for more information. What side effects may I notice from receiving this medicine? Side effects that you should report to your doctor or health care professional as soon as possible: -allergic reactions like skin rash, itching or hives, swelling of the face, lips, or tongue -feeling faint or lightheaded, falls -fever or flu-like symptoms -sore throat -vaginal bleeding Side effects that usually do not require medical attention (report to your doctor or health care professional if they continue or are bothersome): -aches, pains -constipation or diarrhea -headache -hot flashes -nausea, vomiting -pain at site where injected -stomach pain This list may not describe all possible side effects. Call your doctor for medical advice about side effects. You may report side effects to FDA at 1-800-FDA-1088. Where should I keep my medicine? This drug is given in a hospital or clinic and will not be stored at home. NOTE: This sheet is a summary. It may not cover all possible information. If you have questions about this medicine, talk to your doctor, pharmacist, or health care provider.  2012, [employer]. (08/26/2007 3:39:24 PM)Cyanocobalamin, Vitamin B12 injection What is this medicine? CYANOCOBALAMIN (sye an oh koe BAL a min) is a man made form of vitamin B12. Vitamin B12 is used in the growth of healthy blood cells, nerve cells, and proteins in the body. It also helps with the metabolism of fats and carbohydrates. This medicine is used to treat people who  can not absorb vitamin B12. This medicine may be used for other purposes; ask your health care provider or pharmacist if you have questions. What should I tell my health care provider before I take this medicine? They need to  know if you have any of these conditions: -kidney disease -Leber's disease -megaloblastic anemia -an unusual or allergic reaction to cyanocobalamin, cobalt, other medicines, foods, dyes, or preservatives -pregnant or trying to get pregnant -breast-feeding How should I use this medicine? This medicine is injected into a muscle or deeply under the skin. It is usually given by a health care professional in a clinic or doctor's office. However, your doctor may teach you how to inject yourself. Follow all instructions. Talk to your pediatrician regarding the use of this medicine in children. Special care may be needed. Overdosage: If you think you have taken too much of this medicine contact a poison control center or emergency room at once. NOTE: This medicine is only for you. Do not share this medicine with others. What if I miss a dose? If you are given your dose at a clinic or doctor's office, call to reschedule your appointment. If you give your own injections and you miss a dose, take it as soon as you can. If it is almost time for your next dose, take only that dose. Do not take double or extra doses. What may interact with this medicine? -colchicine -heavy alcohol intake This list may not describe all possible interactions. Give your health care provider a list of all the medicines, herbs, non-prescription drugs, or dietary supplements you use. Also tell them if you smoke, drink alcohol, or use illegal drugs. Some items may interact with your medicine. What should I watch for while using this medicine? Visit your doctor or health care professional regularly. You may need blood work done while you are taking this medicine. You may need to follow a special diet. Talk to your doctor. Limit your alcohol intake and avoid smoking to get the best benefit. What side effects may I notice from receiving this medicine? Side effects that you should report to your doctor or health care professional as  soon as possible: -allergic reactions like skin rash, itching or hives, swelling of the face, lips, or tongue -blue tint to skin -chest tightness, pain -difficulty breathing, wheezing -dizziness -red, swollen painful area on the leg Side effects that usually do not require medical attention (report to your doctor or health care professional if they continue or are bothersome): -diarrhea -headache This list may not describe all possible side effects. Call your doctor for medical advice about side effects. You may report side effects to FDA at 1-800-FDA-1088. Where should I keep my medicine? Keep out of the reach of children. Store at room temperature between 15 and 30 degrees C (59 and 85 degrees F). Protect from light. Throw away any unused medicine after the expiration date. NOTE: This sheet is a summary. It may not cover all possible information. If you have questions about this medicine, talk to your doctor, pharmacist, or health care provider.  2012, [employer] Standard. (07/29/2007 10:10:20 PM)   Cyanocobalamin, Vitamin B12 injection What is this medicine? CYANOCOBALAMIN (sye an oh koe BAL a min) is a man made form of vitamin B12. Vitamin B12 is used in the growth of healthy blood cells, nerve cells, and proteins in the body. It also helps with the metabolism of fats and carbohydrates. This medicine is used to treat people who can not absorb vitamin  B12. This medicine may be used for other purposes; ask your health care provider or pharmacist if you have questions. What should I tell my health care provider before I take this medicine? They need to know if you have any of these conditions: -kidney disease -Leber's disease -megaloblastic anemia -an unusual or allergic reaction to cyanocobalamin, cobalt, other medicines, foods, dyes, or preservatives -pregnant or trying to get pregnant -breast-feeding How should I use this medicine? This medicine is injected into a muscle or  deeply under the skin. It is usually given by a health care professional in a clinic or doctor's office. However, your doctor may teach you how to inject yourself. Follow all instructions. Talk to your pediatrician regarding the use of this medicine in children. Special care may be needed. Overdosage: If you think you have taken too much of this medicine contact a poison control center or emergency room at once. NOTE: This medicine is only for you. Do not share this medicine with others. What if I miss a dose? If you are given your dose at a clinic or doctor's office, call to reschedule your appointment. If you give your own injections and you miss a dose, take it as soon as you can. If it is almost time for your next dose, take only that dose. Do not take double or extra doses. What may interact with this medicine? -colchicine -heavy alcohol intake This list may not describe all possible interactions. Give your health care provider a list of all the medicines, herbs, non-prescription drugs, or dietary supplements you use. Also tell them if you smoke, drink alcohol, or use illegal drugs. Some items may interact with your medicine. What should I watch for while using this medicine? Visit your doctor or health care professional regularly. You may need blood work done while you are taking this medicine. You may need to follow a special diet. Talk to your doctor. Limit your alcohol intake and avoid smoking to get the best benefit. What side effects may I notice from receiving this medicine? Side effects that you should report to your doctor or health care professional as soon as possible: -allergic reactions like skin rash, itching or hives, swelling of the face, lips, or tongue -blue tint to skin -chest tightness, pain -difficulty breathing, wheezing -dizziness -red, swollen painful area on the leg Side effects that usually do not require medical attention (report to your doctor or health care  professional if they continue or are bothersome): -diarrhea -headache This list may not describe all possible side effects. Call your doctor for medical advice about side effects. You may report side effects to FDA at 1-800-FDA-1088. Where should I keep my medicine? Keep out of the reach of children. Store at room temperature between 15 and 30 degrees C (59 and 85 degrees F). Protect from light. Throw away any unused medicine after the expiration date. NOTE: This sheet is a summary. It may not cover all possible information. If you have questions about this medicine, talk to your doctor, pharmacist, or health care provider.  2012, [employer] Standard. (07/29/2007 10:10:20 PM)   Cyanocobalamin, Vitamin B12 injection What is this medicine? CYANOCOBALAMIN (sye an oh koe BAL a min) is a man made form of vitamin B12. Vitamin B12 is used in the growth of healthy blood cells, nerve cells, and proteins in the body. It also helps with the metabolism of fats and carbohydrates. This medicine is used to treat people who can not absorb vitamin B12. This medicine may  be used for other purposes; ask your health care provider or pharmacist if you have questions. What should I tell my health care provider before I take this medicine? They need to know if you have any of these conditions: -kidney disease -Leber's disease -megaloblastic anemia -an unusual or allergic reaction to cyanocobalamin, cobalt, other medicines, foods, dyes, or preservatives -pregnant or trying to get pregnant -breast-feeding How should I use this medicine? This medicine is injected into a muscle or deeply under the skin. It is usually given by a health care professional in a clinic or doctor's office. However, your doctor may teach you how to inject yourself. Follow all instructions. Talk to your pediatrician regarding the use of this medicine in children. Special care may be needed. Overdosage: If you think you have taken too much  of this medicine contact a poison control center or emergency room at once. NOTE: This medicine is only for you. Do not share this medicine with others. What if I miss a dose? If you are given your dose at a clinic or doctor's office, call to reschedule your appointment. If you give your own injections and you miss a dose, take it as soon as you can. If it is almost time for your next dose, take only that dose. Do not take double or extra doses. What may interact with this medicine? -colchicine -heavy alcohol intake This list may not describe all possible interactions. Give your health care provider a list of all the medicines, herbs, non-prescription drugs, or dietary supplements you use. Also tell them if you smoke, drink alcohol, or use illegal drugs. Some items may interact with your medicine. What should I watch for while using this medicine? Visit your doctor or health care professional regularly. You may need blood work done while you are taking this medicine. You may need to follow a special diet. Talk to your doctor. Limit your alcohol intake and avoid smoking to get the best benefit. What side effects may I notice from receiving this medicine? Side effects that you should report to your doctor or health care professional as soon as possible: -allergic reactions like skin rash, itching or hives, swelling of the face, lips, or tongue -blue tint to skin -chest tightness, pain -difficulty breathing, wheezing -dizziness -red, swollen painful area on the leg Side effects that usually do not require medical attention (report to your doctor or health care professional if they continue or are bothersome): -diarrhea -headache This list may not describe all possible side effects. Call your doctor for medical advice about side effects. You may report side effects to FDA at 1-800-FDA-1088. Where should I keep my medicine? Keep out of the reach of children. Store at room temperature between 15  and 30 degrees C (59 and 85 degrees F). Protect from light. Throw away any unused medicine after the expiration date. NOTE: This sheet is a summary. It may not cover all possible information. If you have questions about this medicine, talk to your doctor, pharmacist, or health care provider.  2013, [employer]. (07/29/2007 10:10:20 PM)

## 2012-11-08 NOTE — Telephone Encounter (Signed)
Left message for new pt to call and schedule with Dr. Charlton Haws at [facility]. [phone] fax [phone]

## 2012-11-08 NOTE — Telephone Encounter (Signed)
Pt aware of 8-11 and RA will call to schedule referral

## 2012-11-08 NOTE — Progress Notes (Signed)
This office note has been dictated.

## 2012-11-08 NOTE — Telephone Encounter (Signed)
Omega from [facility] called back, she is aware will fax referral when MD note gets back.

## 2012-11-09 NOTE — Progress Notes (Signed)
DIAGNOSES: 1. 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 2. 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 3. Iron deficiency anemia/B12 deficiency secondary to gastric bypass. 4. Severe rheumatoid arthritis.  CURRENT THERAPY: 1. Faslodex 500 mg IM monthly. 2. Vitamin B12 1 mg IM monthly. 3. IV iron as indicated.  INTERIM HISTORY:  [name] comes in for followup.  She is having some problems with her rheumatoid arthritis.  She does not like her rheumatologist.  She needs to see a new rheumatologist.  She is off methotrexate.  We will refer her to Dr. Zenovia Jordan.  She did have a followup PET scan.  This was done on June 25.  The PET scan really showed no evidence of recurrent disease.  Her liver looked fine.  There was a 7 mm left iliac lymph node with nonspecific hypermetabolic activity.  Again, there are no problems with her liver. Everything in the chest also looked fine.  Her last CA27.29 back in May was 25.  She and her daughter went up to [place] for, I think, a week or so. They had a great time up there.  PHYSICAL EXAMINATION:  General:  This is an obese white female in no obvious distress.  Vital signs:  Temperature 98.2, pulse 82, respiratory rate 16, blood pressure 111/69.  Weight is 287.  Head and neck: Normocephalic, atraumatic skull.  There are no ocular or oral lesions. There are no palpable cervical or supraclavicular lymph nodes.  Lungs: Clear bilaterally.  Cardiac:  Regular rate and rhythm with a normal S1 and S2.  There are no murmurs, rubs, or bruits.  Abdomen:  Soft with good bowel sounds.  There is no palpable abdominal mass.  There is no fluid wave.  There is no palpable hepatosplenomegaly.  Extremities:  No clubbing, cyanosis, or edema.  There may be some slight lymphedema of the right arm.  Back:  No tenderness over the spine, ribs, or hips.  LABORATORY STUDIES:  White cell count is 5.3, hemoglobin 11.2, hematocrit 34.5, and platelet  count 214.  MCV is 102.  Her sodium is 141, potassium 4.3, BUN 17, and creatinine 1.1.  Calcium is 9.2 with an albumin of 2.9.  IMPRESSION:  [name] is a 57 year old white female with a history of metastatic breast cancer.  She had a solitary liver lesion.  She did undergo radiofrequency ablation of this lesion.  We have her on Faslodex.  Again, I do not see any obvious recurrence of metastatic disease.  This left inguinal lymph node is nonspecific. However, we will have to watch this.  We will see what her iron studies show.  She may need another dose of IV iron.  She did get her vitamin B12 today.  I want see her back in another month or so.  We will continue to follow her monthly.    ______________________________ Josph Macho, M.D. PRE/MEDQ  D:  11/08/2012  T:  11/09/2012  Job:  [id number]

## 2012-11-11 ENCOUNTER — Telehealth: Payer: Self-pay | Admitting: Hematology & Oncology

## 2012-11-11 NOTE — Telephone Encounter (Signed)
Faxed referral to [phone] [facility] Dr. Nickola Major for RA. Omega is contact [phone]

## 2012-11-11 NOTE — Telephone Encounter (Addendum)
Message copied by Cathi Roan on Mon Nov 11, 2012  1:36 PM ------      Message from: Josph Macho      Created: Fri Nov 08, 2012  5:48 PM       Call dgtr - iron studies still good!!  Cindee Lame ------  11-11-12  Called and left message on home phone regarding above MD note, advised if any questions to call this office.  Lupita Raider LPN

## 2012-11-13 ENCOUNTER — Telehealth: Payer: Self-pay | Admitting: Hematology & Oncology

## 2012-11-13 NOTE — Telephone Encounter (Signed)
Omega from [facility] left message that pt cannot see Dr. Nickola Major due to being a pt of Dr. Dareen Piano. Pt is aware and will call with another MD to refer pt too.

## 2012-11-19 ENCOUNTER — Ambulatory Visit (INDEPENDENT_AMBULATORY_CARE_PROVIDER_SITE_OTHER): Payer: Medicare Other | Admitting: Surgery

## 2012-11-19 ENCOUNTER — Telehealth (INDEPENDENT_AMBULATORY_CARE_PROVIDER_SITE_OTHER): Payer: Self-pay

## 2012-11-19 NOTE — Telephone Encounter (Signed)
Pt n/s appt today. LMOM for pt to call. Per Dr Gerrit Friends since pt is being followed by her oncologist she does not need to make future appt with Dr Gerrit Friends for her thyroid. Pt can f/u with out office as needed.

## 2012-11-25 ENCOUNTER — Encounter (INDEPENDENT_AMBULATORY_CARE_PROVIDER_SITE_OTHER): Payer: Self-pay | Admitting: Surgery

## 2012-11-28 DIAGNOSIS — M47817 Spondylosis without myelopathy or radiculopathy, lumbosacral region: Secondary | ICD-10-CM | POA: Diagnosis not present

## 2012-11-28 DIAGNOSIS — M48062 Spinal stenosis, lumbar region with neurogenic claudication: Secondary | ICD-10-CM | POA: Diagnosis not present

## 2012-11-28 DIAGNOSIS — M2550 Pain in unspecified joint: Secondary | ICD-10-CM | POA: Diagnosis not present

## 2012-11-28 DIAGNOSIS — G609 Hereditary and idiopathic neuropathy, unspecified: Secondary | ICD-10-CM | POA: Diagnosis not present

## 2012-11-28 DIAGNOSIS — Z79899 Other long term (current) drug therapy: Secondary | ICD-10-CM | POA: Diagnosis not present

## 2012-12-09 ENCOUNTER — Ambulatory Visit: Payer: Self-pay

## 2012-12-09 ENCOUNTER — Other Ambulatory Visit: Payer: Self-pay | Admitting: Lab

## 2012-12-09 ENCOUNTER — Ambulatory Visit: Payer: Self-pay | Admitting: Hematology & Oncology

## 2012-12-09 ENCOUNTER — Telehealth: Payer: Self-pay | Admitting: Hematology & Oncology

## 2012-12-09 NOTE — Telephone Encounter (Signed)
Patient's daughter called and cx 12/09/12 apt due to not feeling well.  Apt was resch for 12/18/12.  Rn was notifed of cx apt

## 2012-12-18 ENCOUNTER — Ambulatory Visit: Payer: Self-pay

## 2012-12-18 ENCOUNTER — Ambulatory Visit: Payer: Self-pay | Admitting: Hematology & Oncology

## 2012-12-18 ENCOUNTER — Other Ambulatory Visit: Payer: Self-pay | Admitting: Lab

## 2012-12-18 ENCOUNTER — Telehealth: Payer: Self-pay | Admitting: Hematology & Oncology

## 2012-12-18 NOTE — Telephone Encounter (Signed)
Pt rescheduled appointment for today to 9-3 said had the flu

## 2012-12-23 DIAGNOSIS — M2550 Pain in unspecified joint: Secondary | ICD-10-CM | POA: Diagnosis not present

## 2012-12-23 DIAGNOSIS — G609 Hereditary and idiopathic neuropathy, unspecified: Secondary | ICD-10-CM | POA: Diagnosis not present

## 2012-12-23 DIAGNOSIS — M48062 Spinal stenosis, lumbar region with neurogenic claudication: Secondary | ICD-10-CM | POA: Diagnosis not present

## 2012-12-23 DIAGNOSIS — M47817 Spondylosis without myelopathy or radiculopathy, lumbosacral region: Secondary | ICD-10-CM | POA: Diagnosis not present

## 2013-01-01 ENCOUNTER — Ambulatory Visit: Payer: Self-pay

## 2013-01-01 ENCOUNTER — Ambulatory Visit: Payer: Self-pay | Admitting: Hematology & Oncology

## 2013-01-01 ENCOUNTER — Other Ambulatory Visit: Payer: Self-pay | Admitting: Lab

## 2013-01-09 ENCOUNTER — Telehealth: Payer: Self-pay | Admitting: Hematology & Oncology

## 2013-01-09 NOTE — Telephone Encounter (Signed)
Pt aware of 9-16 °

## 2013-01-09 NOTE — Telephone Encounter (Signed)
Kerri Vasquez called wants to reschedule 9-3 no show, I left her message to call back.

## 2013-01-13 DIAGNOSIS — G609 Hereditary and idiopathic neuropathy, unspecified: Secondary | ICD-10-CM | POA: Diagnosis not present

## 2013-01-13 DIAGNOSIS — M2550 Pain in unspecified joint: Secondary | ICD-10-CM | POA: Diagnosis not present

## 2013-01-13 DIAGNOSIS — M47817 Spondylosis without myelopathy or radiculopathy, lumbosacral region: Secondary | ICD-10-CM | POA: Diagnosis not present

## 2013-01-13 DIAGNOSIS — M48062 Spinal stenosis, lumbar region with neurogenic claudication: Secondary | ICD-10-CM | POA: Diagnosis not present

## 2013-01-14 ENCOUNTER — Other Ambulatory Visit (HOSPITAL_BASED_OUTPATIENT_CLINIC_OR_DEPARTMENT_OTHER): Payer: Medicare Other | Admitting: Lab

## 2013-01-14 ENCOUNTER — Ambulatory Visit (HOSPITAL_BASED_OUTPATIENT_CLINIC_OR_DEPARTMENT_OTHER): Payer: Medicare Other | Admitting: Hematology & Oncology

## 2013-01-14 ENCOUNTER — Ambulatory Visit (HOSPITAL_BASED_OUTPATIENT_CLINIC_OR_DEPARTMENT_OTHER): Payer: Medicare Other

## 2013-01-14 VITALS — BP 131/75 | HR 69 | Temp 97.8°F | Resp 16 | Ht 66.0 in | Wt 279.0 lb

## 2013-01-14 DIAGNOSIS — E538 Deficiency of other specified B group vitamins: Secondary | ICD-10-CM | POA: Diagnosis not present

## 2013-01-14 DIAGNOSIS — C50919 Malignant neoplasm of unspecified site of unspecified female breast: Secondary | ICD-10-CM

## 2013-01-14 DIAGNOSIS — M069 Rheumatoid arthritis, unspecified: Secondary | ICD-10-CM

## 2013-01-14 DIAGNOSIS — Z5111 Encounter for antineoplastic chemotherapy: Secondary | ICD-10-CM | POA: Diagnosis not present

## 2013-01-14 DIAGNOSIS — C50912 Malignant neoplasm of unspecified site of left female breast: Secondary | ICD-10-CM

## 2013-01-14 DIAGNOSIS — C787 Secondary malignant neoplasm of liver and intrahepatic bile duct: Secondary | ICD-10-CM

## 2013-01-14 DIAGNOSIS — D509 Iron deficiency anemia, unspecified: Secondary | ICD-10-CM

## 2013-01-14 DIAGNOSIS — Z9884 Bariatric surgery status: Secondary | ICD-10-CM

## 2013-01-14 DIAGNOSIS — C50519 Malignant neoplasm of lower-outer quadrant of unspecified female breast: Secondary | ICD-10-CM

## 2013-01-14 DIAGNOSIS — I10 Essential (primary) hypertension: Secondary | ICD-10-CM

## 2013-01-14 DIAGNOSIS — D649 Anemia, unspecified: Secondary | ICD-10-CM

## 2013-01-14 DIAGNOSIS — D510 Vitamin B12 deficiency anemia due to intrinsic factor deficiency: Secondary | ICD-10-CM

## 2013-01-14 DIAGNOSIS — R609 Edema, unspecified: Secondary | ICD-10-CM

## 2013-01-14 LAB — IRON AND TIBC CHCC: Iron: 86 ug/dL (ref 41–142)

## 2013-01-14 LAB — CBC WITH DIFFERENTIAL (CANCER CENTER ONLY)
EOS%: 5.3 % (ref 0.0–7.0)
MCH: 32.2 pg (ref 26.0–34.0)
MCHC: 32.3 g/dL (ref 32.0–36.0)
MONO%: 7.9 % (ref 0.0–13.0)
NEUT#: 2.4 10*3/uL (ref 1.5–6.5)
Platelets: 226 10*3/uL (ref 145–400)
RBC: 3.51 10*6/uL — ABNORMAL LOW (ref 3.70–5.32)

## 2013-01-14 LAB — CMP (CANCER CENTER ONLY)
BUN, Bld: 16 mg/dL (ref 7–22)
CO2: 29 mEq/L (ref 18–33)
Calcium: 9.1 mg/dL (ref 8.0–10.3)
Chloride: 107 mEq/L (ref 98–108)
Creat: 1.1 mg/dl (ref 0.6–1.2)

## 2013-01-14 MED ORDER — FUROSEMIDE 20 MG PO TABS: 20.0000 mg | ORAL_TABLET | Freq: Every day | ORAL | Status: DC

## 2013-01-14 MED ORDER — FULVESTRANT 250 MG/5ML IM SOLN
500.0000 mg | Freq: Once | INTRAMUSCULAR | Status: AC
  Administered 2013-01-14: 500 mg via INTRAMUSCULAR
  Filled 2013-01-14: qty 10

## 2013-01-14 MED ORDER — CYANOCOBALAMIN 1000 MCG/ML IJ SOLN
1000.0000 ug | Freq: Once | INTRAMUSCULAR | Status: AC
  Administered 2013-01-14: 1000 ug via INTRAMUSCULAR

## 2013-01-14 NOTE — Progress Notes (Signed)
This office note has been dictated.

## 2013-01-15 ENCOUNTER — Telehealth: Payer: Self-pay | Admitting: Hematology & Oncology

## 2013-01-15 LAB — CANCER ANTIGEN 27.29: CA 27.29: 23 U/mL (ref 0–39)

## 2013-01-15 NOTE — Telephone Encounter (Signed)
Pt aware of 9-25 PET [place] and to be NPO 6 hrs. She is also aware of 10-28 MD

## 2013-01-23 ENCOUNTER — Other Ambulatory Visit: Payer: Self-pay

## 2013-01-23 ENCOUNTER — Encounter (HOSPITAL_COMMUNITY): Payer: Medicare Other

## 2013-01-27 NOTE — Progress Notes (Signed)
This encounter was created in error - please disregard.

## 2013-01-28 NOTE — Progress Notes (Signed)
DIAGNOSES: 1. Metastatic breast cancer, solitary liver metastasis. 2. 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 3. 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 4. Vitamin B12 deficiency secondary to gastric bypass. 5. Severe rheumatoid arthritis.  CURRENT THERAPY: 1. Faslodex 500 mg IM monthly. 2. Vitamin B12 at 1 mg IM monthly. 3. IV iron as indicated.  INTERIM HISTORY:  [name] comes in for followup.  We last saw her back in early July.  She had been busy.  She has been up to [place]. Mother, who is 56 years old, is having declining health.  They are going to move her down to [place].  They are heading back up in another week or so.  She tends to not be able to make her appointments.  As such, we have not been able to give her Faslodex on schedule.  Her last scans were done back in early June.  She will be due for some scans in September.  Back when we saw her in July, her CA 27.29 was 25.  Her rheumatoid arthritis is really bothering her.  She is not pleased with her current rheumatologist.  As such, we will see about making a referral to a different one.  She has had no bleeding.  __________ she has had no change in bowel or bladder habits.  She has some hernia issues that are causing her  some pain.  She gets around with a cane.  This does help stabilize her a little bit.  Overall, I would say that her performance status is ECOG 2 at best.  PHYSICAL EXAMINATION:  General:  This is an obese white female in mild distress secondary to pain from her rheumatoid arthritis.  Vital Signs: Temperature of 97.8, pulse 69, respiratory rate 16, blood pressure 131/75.  Weight is 279 pounds.  Head and Neck Exam:  Normocephalic, atraumatic skull.  There is no scleral icterus.  There is no oral lesion.  She has no thrush.  There is no adenopathy in her neck. Thyroid has a well-healed scar from a thyroidectomy.  No abnormalities noted with the thyroid.   Lungs:  With some decrease at the bases. Cardiac Exam:  Regular rate and rhythm, with a normal S1 and S2.  There are no murmurs, rubs or bruits.  Breast Exam:  Shows bilateral reductions.  She has, I think, implants.  I do not detect anything that is unusual.  There is no bilateral axillary adenopathy.  Abdomen:  Soft. She is obese.  She has good bowel sounds.  There is no fluid wave. There may be some slight tenderness in the right side.  I cannot palpate any hepatic enlargement.  There is no splenomegaly.  Back Exam:  Shows tenderness over the spine.  This is to the entire spine.  No paravertebral muscle spasms are noted.  Extremities:  Show some 1+ edema in lower legs.  She may have some slight stasis, dermatitis changes, in her legs.  Skin Exam:  Shows no rashes, ecchymoses or petechia. Neurological Exam:  No focal neurological deficit.  LABORATORY STUDIES:  White cell count is 4.5, hemoglobin 11.3, hematocrit 35, platelet count is 226.  Her electrolytes all look okay.  Albumin is 3.  Liver function tests are normal.  IMPRESSION:  [name] is a very nice 57 year old white female.  She has history of metastatic breast cancer.  She had a solitary hepatic metastasis.  This actually was treated with radiofrequency ablation. She had this done in the last  year.  We will go ahead and repeat her PET scan.  I think this is important.  We will also go ahead and give her vitamin B12 today.  Hopefully, she will be able to make her appointments monthly.  If she does have evidence of progressive disease, then we can certainly consider therapy changes.  I really would like to avoid chemotherapy if I could.  We might be able to add Afinitor to the Faslodex.  We will see about getting her back in 1 month.  Hopefully, we will get a referral to a rheumatologist set up for her.  I spent a good 45 minutes with her and her daughter today.  I did give her a prescription for Lasix to take for  her legs.  Unfortunately, she does not have a family doctor, so we try to do as much as we can for her.    ______________________________ Josph Macho, M.D. PRE/MEDQ  D:  01/14/2013  T:  01/28/2013  Job:  [id number]

## 2013-01-30 ENCOUNTER — Ambulatory Visit (INDEPENDENT_AMBULATORY_CARE_PROVIDER_SITE_OTHER): Payer: Medicare Other | Admitting: Surgery

## 2013-01-31 ENCOUNTER — Telehealth: Payer: Self-pay | Admitting: Hematology & Oncology

## 2013-01-31 NOTE — Telephone Encounter (Signed)
Pt aware of 10-23 appointment

## 2013-02-06 DIAGNOSIS — M47817 Spondylosis without myelopathy or radiculopathy, lumbosacral region: Secondary | ICD-10-CM | POA: Diagnosis not present

## 2013-02-06 DIAGNOSIS — M48062 Spinal stenosis, lumbar region with neurogenic claudication: Secondary | ICD-10-CM | POA: Diagnosis not present

## 2013-02-06 DIAGNOSIS — M2550 Pain in unspecified joint: Secondary | ICD-10-CM | POA: Diagnosis not present

## 2013-02-06 DIAGNOSIS — G609 Hereditary and idiopathic neuropathy, unspecified: Secondary | ICD-10-CM | POA: Diagnosis not present

## 2013-02-07 ENCOUNTER — Other Ambulatory Visit: Payer: Self-pay

## 2013-02-07 ENCOUNTER — Encounter (HOSPITAL_COMMUNITY): Payer: Medicare Other

## 2013-02-19 ENCOUNTER — Telehealth: Payer: Self-pay | Admitting: Hematology & Oncology

## 2013-02-19 NOTE — Telephone Encounter (Signed)
Faxed records to [place] at Dr. Ines Bloomer office for 10-23 appointment. [phone]

## 2013-02-21 ENCOUNTER — Encounter (HOSPITAL_COMMUNITY): Payer: Medicare Other

## 2013-02-21 ENCOUNTER — Other Ambulatory Visit: Payer: Self-pay

## 2013-02-25 ENCOUNTER — Ambulatory Visit: Payer: Self-pay

## 2013-02-25 ENCOUNTER — Other Ambulatory Visit: Payer: Self-pay | Admitting: Lab

## 2013-02-25 ENCOUNTER — Ambulatory Visit: Payer: Self-pay | Admitting: Hematology & Oncology

## 2013-02-26 ENCOUNTER — Telehealth: Payer: Self-pay | Admitting: Hematology & Oncology

## 2013-02-26 NOTE — Telephone Encounter (Signed)
Pt aware of 11-6

## 2013-03-05 ENCOUNTER — Other Ambulatory Visit: Payer: Self-pay

## 2013-03-05 ENCOUNTER — Encounter (HOSPITAL_COMMUNITY): Payer: Medicare Other

## 2013-03-06 ENCOUNTER — Other Ambulatory Visit (HOSPITAL_BASED_OUTPATIENT_CLINIC_OR_DEPARTMENT_OTHER): Payer: Medicare Other | Admitting: Lab

## 2013-03-06 ENCOUNTER — Ambulatory Visit (HOSPITAL_BASED_OUTPATIENT_CLINIC_OR_DEPARTMENT_OTHER): Payer: Medicare Other

## 2013-03-06 ENCOUNTER — Other Ambulatory Visit: Payer: Self-pay | Admitting: *Deleted

## 2013-03-06 ENCOUNTER — Ambulatory Visit (HOSPITAL_BASED_OUTPATIENT_CLINIC_OR_DEPARTMENT_OTHER): Payer: Medicare Other | Admitting: Hematology & Oncology

## 2013-03-06 VITALS — BP 146/79 | HR 89 | Temp 97.4°F | Resp 14 | Ht 65.0 in | Wt 269.0 lb

## 2013-03-06 DIAGNOSIS — M47817 Spondylosis without myelopathy or radiculopathy, lumbosacral region: Secondary | ICD-10-CM | POA: Diagnosis not present

## 2013-03-06 DIAGNOSIS — C50919 Malignant neoplasm of unspecified site of unspecified female breast: Secondary | ICD-10-CM | POA: Diagnosis not present

## 2013-03-06 DIAGNOSIS — C787 Secondary malignant neoplasm of liver and intrahepatic bile duct: Secondary | ICD-10-CM

## 2013-03-06 DIAGNOSIS — D440 Neoplasm of uncertain behavior of thyroid gland: Secondary | ICD-10-CM

## 2013-03-06 DIAGNOSIS — E538 Deficiency of other specified B group vitamins: Secondary | ICD-10-CM | POA: Diagnosis not present

## 2013-03-06 DIAGNOSIS — M48062 Spinal stenosis, lumbar region with neurogenic claudication: Secondary | ICD-10-CM | POA: Diagnosis not present

## 2013-03-06 DIAGNOSIS — G609 Hereditary and idiopathic neuropathy, unspecified: Secondary | ICD-10-CM | POA: Diagnosis not present

## 2013-03-06 DIAGNOSIS — Z5111 Encounter for antineoplastic chemotherapy: Secondary | ICD-10-CM

## 2013-03-06 DIAGNOSIS — D510 Vitamin B12 deficiency anemia due to intrinsic factor deficiency: Secondary | ICD-10-CM

## 2013-03-06 DIAGNOSIS — M069 Rheumatoid arthritis, unspecified: Secondary | ICD-10-CM

## 2013-03-06 DIAGNOSIS — C50519 Malignant neoplasm of lower-outer quadrant of unspecified female breast: Secondary | ICD-10-CM

## 2013-03-06 DIAGNOSIS — C50911 Malignant neoplasm of unspecified site of right female breast: Secondary | ICD-10-CM

## 2013-03-06 DIAGNOSIS — Z9884 Bariatric surgery status: Secondary | ICD-10-CM | POA: Diagnosis not present

## 2013-03-06 DIAGNOSIS — M810 Age-related osteoporosis without current pathological fracture: Secondary | ICD-10-CM

## 2013-03-06 DIAGNOSIS — R110 Nausea: Secondary | ICD-10-CM

## 2013-03-06 LAB — CBC WITH DIFFERENTIAL (CANCER CENTER ONLY)
BASO#: 0 10*3/uL (ref 0.0–0.2)
Eosinophils Absolute: 0.2 10*3/uL (ref 0.0–0.5)
HCT: 36.4 % (ref 34.8–46.6)
HGB: 12 g/dL (ref 11.6–15.9)
LYMPH%: 25.1 % (ref 14.0–48.0)
MCH: 32 pg (ref 26.0–34.0)
MCV: 97 fL (ref 81–101)
MONO%: 7.8 % (ref 0.0–13.0)
RBC: 3.75 10*6/uL (ref 3.70–5.32)

## 2013-03-06 MED ORDER — CYANOCOBALAMIN 1000 MCG/ML IJ SOLN
INTRAMUSCULAR | Status: AC
  Filled 2013-03-06: qty 1

## 2013-03-06 MED ORDER — FULVESTRANT 250 MG/5ML IM SOLN
500.0000 mg | Freq: Once | INTRAMUSCULAR | Status: AC
  Administered 2013-03-06: 500 mg via INTRAMUSCULAR
  Filled 2013-03-06: qty 10

## 2013-03-06 MED ORDER — FULVESTRANT 250 MG/5ML IM SOLN
INTRAMUSCULAR | Status: AC
  Filled 2013-03-06: qty 10

## 2013-03-06 MED ORDER — CHOLECALCIFEROL 250 MCG (10000 UT) PO CAPS: ORAL_CAPSULE | ORAL | Status: DC

## 2013-03-06 MED ORDER — CYANOCOBALAMIN 1000 MCG/ML IJ SOLN
1000.0000 ug | Freq: Once | INTRAMUSCULAR | Status: AC
  Administered 2013-03-06: 1000 ug via INTRAMUSCULAR

## 2013-03-06 MED ORDER — PROMETHAZINE HCL 25 MG PO TABS: 25.0000 mg | ORAL_TABLET | Freq: Four times a day (QID) | ORAL | Status: DC | PRN

## 2013-03-06 NOTE — Progress Notes (Signed)
This office note has been dictated.

## 2013-03-06 NOTE — Patient Instructions (Signed)
Fulvestrant injection What is this medicine? FULVESTRANT (ful VES trant) blocks the effects of estrogen. It is used to treat breast cancer in women past the age of menopause. This medicine may be used for other purposes; ask your health care provider or pharmacist if you have questions. COMMON BRAND NAME(S): FASLODEX What should I tell my health care provider before I take this medicine? They need to know if you have any of these conditions: -bleeding problems -liver disease -low levels of platelets in the blood -an unusual or allergic reaction to fulvestrant, other medicines, foods, dyes, or preservatives -pregnant or trying to get pregnant -breast-feeding How should I use this medicine? This medicine is for injection into a muscle. It is usually given by a health care professional in a hospital or clinic setting. Talk to your pediatrician regarding the use of this medicine in children. Special care may be needed. Overdosage: If you think you have taken too much of this medicine contact a poison control center or emergency room at once. NOTE: This medicine is only for you. Do not share this medicine with others. What if I miss a dose? It is important not to miss your dose. Call your doctor or health care professional if you are unable to keep an appointment. What may interact with this medicine? -medicines that treat or prevent blood clots like warfarin, enoxaparin, and dalteparin This list may not describe all possible interactions. Give your health care provider a list of all the medicines, herbs, non-prescription drugs, or dietary supplements you use. Also tell them if you smoke, drink alcohol, or use illegal drugs. Some items may interact with your medicine. What should I watch for while using this medicine? Your condition will be monitored carefully while you are receiving this medicine. You will need important blood work done while you are taking this medicine. Do not become pregnant  while taking this medicine. Women should inform their doctor if they wish to become pregnant or think they might be pregnant. There is a potential for serious side effects to an unborn child. Talk to your health care professional or pharmacist for more information. What side effects may I notice from receiving this medicine? Side effects that you should report to your doctor or health care professional as soon as possible: -allergic reactions like skin rash, itching or hives, swelling of the face, lips, or tongue -feeling faint or lightheaded, falls -fever or flu-like symptoms -sore throat -vaginal bleeding Side effects that usually do not require medical attention (report to your doctor or health care professional if they continue or are bothersome): -aches, pains -constipation or diarrhea -headache -hot flashes -nausea, vomiting -pain at site where injected -stomach pain This list may not describe all possible side effects. Call your doctor for medical advice about side effects. You may report side effects to FDA at 1-800-FDA-1088. Where should I keep my medicine? This drug is given in a hospital or clinic and will not be stored at home. NOTE: This sheet is a summary. It may not cover all possible information. If you have questions about this medicine, talk to your doctor, pharmacist, or health care provider.  2014, Elsevier/Gold Standard. (2007-08-26 15:39:24) Vitamin B12 Deficiency Not having enough vitamin B12 is called a deficiency. Your body needs this vitamin for important body functions. HOME CARE  Take all vitamins, herbs, or nutrition drinks (supplements) as told by your doctor.  Get shots (injections) as told. Do not miss your doctor visit.  Eat foods than contain vitamin B12.  This includes:  Meat.  Chicken, [place], or other birds (poultry).  Fish.  Eggs.  Cereals or milk with added vitamin B12. Check the label.  Do not drink too much (abuse) alcohol.  Keep all  doctor visits as told. GET HELP IF:  You have questions.  Your problems come back. MAKE SURE YOU:  Understand these instructions.  Will watch your condition.  Will get help right away if you are not doing well or get worse. Document Released: 04/06/2011 Document Revised: 07/10/2011 Document Reviewed: 04/06/2011 [facility] Patient Information 2014 [place], [place].

## 2013-03-07 ENCOUNTER — Ambulatory Visit (INDEPENDENT_AMBULATORY_CARE_PROVIDER_SITE_OTHER): Payer: Medicare Other | Admitting: Surgery

## 2013-03-07 LAB — COMPREHENSIVE METABOLIC PANEL
Albumin: 3.8 g/dL (ref 3.5–5.2)
Alkaline Phosphatase: 103 U/L (ref 39–117)
BUN: 19 mg/dL (ref 6–23)
Glucose, Bld: 94 mg/dL (ref 70–99)
Potassium: 4.6 mEq/L (ref 3.5–5.3)
Total Bilirubin: 0.3 mg/dL (ref 0.3–1.2)

## 2013-03-08 NOTE — Progress Notes (Signed)
DIAGNOSES: 1. 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 2. 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 3. 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 4. Severe rheumatoid arthritis.  CURRENT THERAPY: 1. Faslodex 500 mg IM monthly. 2. Vitamin B12 1 mg IM monthly. 3. IV iron as indicated.  INTERIM HISTORY:  [name] comes in for a followup.  We last saw her back in September.  She has been having some issues since we last saw her.  She has had the "flu."  She has not been able to get her PET scan done.  We will have to reschedule this.  She and her daughter got her mother down from [place].  I think this is making her feel a little bit better now.  She is a little bothered by her rheumatoid arthritis, appears in lot of pain from this.  She has not yet seen her new rheumatologist.  She has had no issues with bleeding.  There has been no problem with her bowels or bladder.  I think her last tumor marker -- CA 27-29 -- was 23.  Her last ferritin back in September was 199 with a iron saturation of 39%.  PHYSICAL EXAMINATION:  General:  This is an obese white female, in no obvious distress.  Vital Signs:  Temperature of 97.4, pulse 89, respiratory rate 14, blood pressure 146/79, weight is 269 pounds.  Head and Neck:  Normocephalic, atraumatic skull.  There are no ocular or oral lesions.  There are no palpable cervical or supraclavicular lymph nodes. Lungs:  Clear bilaterally.  Cardiac:  Regular rate and rhythm with a normal S1 and S2.  There are no murmurs, rubs, or bruits.  Abdomen: Soft.  The abdomen is somewhat obese.  She has little bit of a ventral abdominal wall hernia.  She has a well-healed laparotomy scar.  There is no fluid wave.  There is no palpable hepatosplenomegaly.  Back:  No tenderness over the spine, ribs, or hips.  Extremities:  Some arthritic issues in her joints.  She has some slight nonpitting edema of her lower legs.  She has  some decreased range of motion of her joints. Neurologic:  No focal neurological deficit.  LABORATORY STUDIES:  White cell count is 7.5, hemoglobin 12, hematocrit 36.4, platelet count 231.  IMPRESSION:  [name] is a very nice 57 year old white female with metastatic breast cancer.  She has a solitary liver metastasis.  This was treated with radiofrequency ablation.  She had this back on January 26, 2012.  We have essentially given her Faslodex in a "adjuvant" fashion to try to delay recurrence/progression.  We will have to see what her PET scan shows.  She has not had Faslodex now for a couple of months.  I do not think iron deficiency is going to be a problem for her this visit.  We did give her B12.  I do want to get her back after Thanksgiving.  I want to try to get her back on schedule.    ______________________________ Josph Macho, M.D. PRE/MEDQ  D:  03/05/2013  T:  03/07/2013  Job:  [id number]

## 2013-03-11 ENCOUNTER — Ambulatory Visit
Admission: RE | Admit: 2013-03-11 | Discharge: 2013-03-11 | Disposition: A | Discharge status: Home / Self Care | Payer: Medicare Other | Source: Ambulatory Visit | Attending: Hematology & Oncology | Admitting: Hematology & Oncology

## 2013-03-11 DIAGNOSIS — M069 Rheumatoid arthritis, unspecified: Secondary | ICD-10-CM | POA: Diagnosis not present

## 2013-03-11 DIAGNOSIS — M2550 Pain in unspecified joint: Secondary | ICD-10-CM | POA: Diagnosis not present

## 2013-03-11 DIAGNOSIS — G4700 Insomnia, unspecified: Secondary | ICD-10-CM | POA: Diagnosis not present

## 2013-03-11 DIAGNOSIS — N644 Mastodynia: Secondary | ICD-10-CM | POA: Diagnosis not present

## 2013-03-11 DIAGNOSIS — C50912 Malignant neoplasm of unspecified site of left female breast: Secondary | ICD-10-CM

## 2013-03-11 DIAGNOSIS — M25469 Effusion, unspecified knee: Secondary | ICD-10-CM | POA: Diagnosis not present

## 2013-03-21 ENCOUNTER — Ambulatory Visit (HOSPITAL_COMMUNITY)
Admission: RE | Admit: 2013-03-21 | Discharge: 2013-03-21 | Disposition: A | Discharge status: Home / Self Care | Payer: Medicare Other | Source: Ambulatory Visit | Attending: Hematology & Oncology | Admitting: Hematology & Oncology

## 2013-03-21 ENCOUNTER — Encounter (HOSPITAL_COMMUNITY): Payer: Self-pay

## 2013-03-21 DIAGNOSIS — Z9884 Bariatric surgery status: Secondary | ICD-10-CM | POA: Insufficient documentation

## 2013-03-21 DIAGNOSIS — C801 Malignant (primary) neoplasm, unspecified: Secondary | ICD-10-CM | POA: Diagnosis not present

## 2013-03-21 DIAGNOSIS — C50911 Malignant neoplasm of unspecified site of right female breast: Secondary | ICD-10-CM

## 2013-03-21 DIAGNOSIS — K439 Ventral hernia without obstruction or gangrene: Secondary | ICD-10-CM | POA: Insufficient documentation

## 2013-03-21 DIAGNOSIS — IMO0002 Reserved for concepts with insufficient information to code with codable children: Secondary | ICD-10-CM | POA: Insufficient documentation

## 2013-03-21 DIAGNOSIS — C50919 Malignant neoplasm of unspecified site of unspecified female breast: Secondary | ICD-10-CM | POA: Diagnosis not present

## 2013-03-21 MED ORDER — FLUDEOXYGLUCOSE F - 18 (FDG) INJECTION
17.8000 | Freq: Once | INTRAVENOUS | Status: AC | PRN
  Administered 2013-03-21: 17.8 via INTRAVENOUS

## 2013-03-25 ENCOUNTER — Encounter: Payer: Self-pay | Admitting: *Deleted

## 2013-03-25 ENCOUNTER — Other Ambulatory Visit (HOSPITAL_COMMUNITY): Payer: Self-pay | Admitting: Interventional Radiology

## 2013-03-25 DIAGNOSIS — C50919 Malignant neoplasm of unspecified site of unspecified female breast: Secondary | ICD-10-CM

## 2013-03-25 DIAGNOSIS — K769 Liver disease, unspecified: Secondary | ICD-10-CM

## 2013-04-01 ENCOUNTER — Ambulatory Visit (INDEPENDENT_AMBULATORY_CARE_PROVIDER_SITE_OTHER): Payer: Medicare Other | Admitting: Surgery

## 2013-04-01 ENCOUNTER — Inpatient Hospital Stay: Admission: RE | Admit: 2013-04-01 | Payer: Self-pay | Source: Ambulatory Visit

## 2013-04-03 DIAGNOSIS — M48062 Spinal stenosis, lumbar region with neurogenic claudication: Secondary | ICD-10-CM | POA: Diagnosis not present

## 2013-04-03 DIAGNOSIS — M2550 Pain in unspecified joint: Secondary | ICD-10-CM | POA: Diagnosis not present

## 2013-04-03 DIAGNOSIS — M47817 Spondylosis without myelopathy or radiculopathy, lumbosacral region: Secondary | ICD-10-CM | POA: Diagnosis not present

## 2013-04-03 DIAGNOSIS — G609 Hereditary and idiopathic neuropathy, unspecified: Secondary | ICD-10-CM | POA: Diagnosis not present

## 2013-04-07 NOTE — Progress Notes (Signed)
PREOPERATIVE DIAGNOSES: 1. Metastatic breast cancer -- solitary liver metastasis. 2. 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 3. 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 4. Vitamin B12 deficiency secondary to gastric bypass. 5. Severe rheumatoid arthritis.  CURRENT THERAPY: 1. Faslodex 500 mg IM monthly. 2. Vitamin B12 at 1 mg IM monthly. 3. IV iron as indicated.  INTERIM HISTORY:  Ms. Bubba Hales comes in for a followup.  We last saw her back in early July.  She has been busy.  She has been going to [place]. Her mother, who is 39 years old, is having declining health.  They are going to move her down to [place].  They are heading back up in another week or so.  She tends to not be able to make her appointments.  As such, we have not been able to give her Faslodex on schedule.  Her last scans were done back in, I think, June.  She will be due for some scans in September.  When we saw her in July, her CA 27.29 was 25.  Her rheumatoid arthritis is really bothering her.  She is not pleased with her current rheumatologist.  As such, we will see about making a referral to a different one.  She has had no bleeding.  She has had no change in bowel or bladder habits.  She does have some hernia issues that have been causing her some pain.  She gets around with a cane.  This does help stabilize her a little bit.  Overall, I would say that her performance status is ECOG 2 at best.  PHYSICAL EXAMINATION:  General:  This is an obese white female in mild distress secondary to pain from her rheumatoid arthritis.  Vital Signs: Temperature of 97.8, pulse 69, respiratory rate 16, blood pressure 131/75.  Weight is 279 pounds.  Head and Neck:  Normocephalic, atraumatic skull.  There is no scleral icterus.  There are no oral lesions.  She has no thrush.  There is no adenopathy in her neck. Thyroid has a well-healed scar from her thyroidectomy.  No  abnormalities noted with the thyroid.  Lungs:  With some decrease at the bases. Cardiac:  Regular rate and rhythm with normal S1 and S2.  There are no murmurs, rubs, or bruits.  Breasts:  Bilateral reductions.  She has I think implants.  I do not detect anything that is unusual.  There is no bilateral axillary adenopathy.  Abdomen:  Soft.  She is obese.  She has good bowel sounds.  There is no fluid wave.  There may be some slight tenderness in the right side.  I cannot palpate any hepatic enlargement. There is no splenomegaly.  Back:  Tenderness over the spine.  This is to the entire spine.  No paravertebral muscle spasms are noted. Extremities:  Some 1+ edema in her lower legs.  She may have some slight stasis dermatitis changes in her legs.  Skin:  No rashes, ecchymoses or petechia.  Neurologic:  No focal neurological deficits.  LABORATORY STUDIES:  White cell count is 4.5, hemoglobin 11.3, hematocrit 35, platelet count is 226.  Her electrolytes all look okay.  Her albumin is 3.0.  Liver function tests are normal.  IMPRESSION:  Ms. Bubba Hales is a very nice 57 year old white female.  She has history of metastatic breast cancer.  She had a solitary hepatic metastasis.  This actually was treated with radiofrequency ablation. She had this done in the last year.  We will  go ahead and repeat her PET scan.  I think this is important.  We will also go ahead and give her vitamin B12 today.  Hopefully, she will be able to make her appointments monthly.  If she does have evidence of progressive disease, then we can certainly consider therapy changes.  I really would like to avoid chemotherapy if I could.  We might be able to add Afinitor to the Faslodex.  We will see about getting her back in 1 month.  Hopefully, we will get the referral to a rheumatologist set up for her.  I spent a good 40 minutes or so with her and her daughter today.  I did give her a prescription for Lasix to take  for her legs.  Unfortunately, she does not have a family doctor, so we will try to do as much as we can for her.    ______________________________ Josph Macho, M.D. PRE/MEDQ  D:  01/14/2013  T:  04/06/2013  Job:  [id number]

## 2013-04-16 ENCOUNTER — Ambulatory Visit: Payer: Self-pay | Admitting: Hematology & Oncology

## 2013-04-16 ENCOUNTER — Ambulatory Visit: Payer: Self-pay

## 2013-04-16 ENCOUNTER — Other Ambulatory Visit: Payer: Self-pay | Admitting: Lab

## 2013-04-16 ENCOUNTER — Telehealth: Payer: Self-pay | Admitting: Hematology & Oncology

## 2013-04-16 NOTE — Telephone Encounter (Signed)
Kerri Vasquez is sick moved 9-17 to 12-23

## 2013-04-18 ENCOUNTER — Telehealth: Payer: Self-pay | Admitting: Hematology & Oncology

## 2013-04-18 NOTE — Telephone Encounter (Signed)
PER MD MOVE 12-23 TO JAN. PT AWARE OF 1-22 APPOINTMENT

## 2013-04-22 ENCOUNTER — Ambulatory Visit: Payer: Self-pay

## 2013-04-22 ENCOUNTER — Ambulatory Visit: Payer: Self-pay | Admitting: Hematology & Oncology

## 2013-04-22 ENCOUNTER — Other Ambulatory Visit: Payer: Self-pay | Admitting: Lab

## 2013-04-30 ENCOUNTER — Other Ambulatory Visit: Payer: Self-pay

## 2013-05-01 HISTORY — PX: BREAST IMPLANT REMOVAL: SUR1101

## 2013-05-05 DIAGNOSIS — G609 Hereditary and idiopathic neuropathy, unspecified: Secondary | ICD-10-CM | POA: Diagnosis not present

## 2013-05-05 DIAGNOSIS — M2550 Pain in unspecified joint: Secondary | ICD-10-CM | POA: Diagnosis not present

## 2013-05-05 DIAGNOSIS — M48062 Spinal stenosis, lumbar region with neurogenic claudication: Secondary | ICD-10-CM | POA: Diagnosis not present

## 2013-05-05 DIAGNOSIS — M47817 Spondylosis without myelopathy or radiculopathy, lumbosacral region: Secondary | ICD-10-CM | POA: Diagnosis not present

## 2013-05-13 ENCOUNTER — Inpatient Hospital Stay: Admission: RE | Admit: 2013-05-13 | Payer: Self-pay | Source: Ambulatory Visit

## 2013-05-15 ENCOUNTER — Telehealth: Payer: Self-pay | Admitting: Hematology & Oncology

## 2013-05-15 ENCOUNTER — Other Ambulatory Visit: Payer: Self-pay | Admitting: Nurse Practitioner

## 2013-05-15 DIAGNOSIS — D510 Vitamin B12 deficiency anemia due to intrinsic factor deficiency: Secondary | ICD-10-CM

## 2013-05-15 DIAGNOSIS — C50919 Malignant neoplasm of unspecified site of unspecified female breast: Secondary | ICD-10-CM

## 2013-05-15 NOTE — Telephone Encounter (Signed)
Pt aware of 1-16 appointment °

## 2013-05-16 ENCOUNTER — Other Ambulatory Visit: Payer: Self-pay | Admitting: Lab

## 2013-05-16 ENCOUNTER — Ambulatory Visit: Payer: Self-pay

## 2013-05-16 ENCOUNTER — Ambulatory Visit: Payer: Self-pay | Admitting: Hematology & Oncology

## 2013-05-21 ENCOUNTER — Ambulatory Visit: Payer: Self-pay

## 2013-05-21 ENCOUNTER — Other Ambulatory Visit: Payer: Self-pay | Admitting: Lab

## 2013-05-21 ENCOUNTER — Ambulatory Visit: Payer: Self-pay | Admitting: Hematology & Oncology

## 2013-05-22 ENCOUNTER — Ambulatory Visit: Payer: Self-pay | Admitting: Hematology & Oncology

## 2013-05-22 ENCOUNTER — Telehealth: Payer: Self-pay | Admitting: Hematology & Oncology

## 2013-05-22 ENCOUNTER — Other Ambulatory Visit: Payer: Self-pay | Admitting: Lab

## 2013-05-22 ENCOUNTER — Ambulatory Visit: Payer: Self-pay

## 2013-05-22 NOTE — Telephone Encounter (Signed)
Pt aware of 1-28

## 2013-05-26 ENCOUNTER — Ambulatory Visit (INDEPENDENT_AMBULATORY_CARE_PROVIDER_SITE_OTHER): Payer: Medicare Other | Admitting: Surgery

## 2013-05-28 ENCOUNTER — Ambulatory Visit (HOSPITAL_BASED_OUTPATIENT_CLINIC_OR_DEPARTMENT_OTHER): Payer: Medicare Other | Admitting: Hematology & Oncology

## 2013-05-28 ENCOUNTER — Other Ambulatory Visit: Payer: Self-pay | Admitting: *Deleted

## 2013-05-28 ENCOUNTER — Ambulatory Visit (HOSPITAL_BASED_OUTPATIENT_CLINIC_OR_DEPARTMENT_OTHER): Payer: Medicare Other

## 2013-05-28 ENCOUNTER — Encounter: Payer: Self-pay | Admitting: Hematology & Oncology

## 2013-05-28 ENCOUNTER — Other Ambulatory Visit (HOSPITAL_BASED_OUTPATIENT_CLINIC_OR_DEPARTMENT_OTHER): Payer: Medicare Other | Admitting: Lab

## 2013-05-28 ENCOUNTER — Encounter (INDEPENDENT_AMBULATORY_CARE_PROVIDER_SITE_OTHER): Payer: Self-pay | Admitting: Surgery

## 2013-05-28 VITALS — BP 160/80 | HR 72 | Temp 97.4°F | Resp 14 | Ht 66.0 in | Wt 287.0 lb

## 2013-05-28 DIAGNOSIS — E538 Deficiency of other specified B group vitamins: Secondary | ICD-10-CM | POA: Diagnosis not present

## 2013-05-28 DIAGNOSIS — D510 Vitamin B12 deficiency anemia due to intrinsic factor deficiency: Secondary | ICD-10-CM | POA: Diagnosis not present

## 2013-05-28 DIAGNOSIS — C50919 Malignant neoplasm of unspecified site of unspecified female breast: Secondary | ICD-10-CM | POA: Diagnosis not present

## 2013-05-28 DIAGNOSIS — M549 Dorsalgia, unspecified: Secondary | ICD-10-CM | POA: Diagnosis not present

## 2013-05-28 DIAGNOSIS — C787 Secondary malignant neoplasm of liver and intrahepatic bile duct: Secondary | ICD-10-CM

## 2013-05-28 DIAGNOSIS — G8929 Other chronic pain: Secondary | ICD-10-CM | POA: Diagnosis not present

## 2013-05-28 DIAGNOSIS — D509 Iron deficiency anemia, unspecified: Secondary | ICD-10-CM

## 2013-05-28 DIAGNOSIS — R112 Nausea with vomiting, unspecified: Secondary | ICD-10-CM

## 2013-05-28 DIAGNOSIS — Z5111 Encounter for antineoplastic chemotherapy: Secondary | ICD-10-CM

## 2013-05-28 DIAGNOSIS — C50519 Malignant neoplasm of lower-outer quadrant of unspecified female breast: Secondary | ICD-10-CM | POA: Diagnosis not present

## 2013-05-28 DIAGNOSIS — D649 Anemia, unspecified: Secondary | ICD-10-CM

## 2013-05-28 DIAGNOSIS — C50419 Malignant neoplasm of upper-outer quadrant of unspecified female breast: Secondary | ICD-10-CM | POA: Diagnosis not present

## 2013-05-28 DIAGNOSIS — M1990 Unspecified osteoarthritis, unspecified site: Secondary | ICD-10-CM

## 2013-05-28 LAB — COMPREHENSIVE METABOLIC PANEL
ALT: 11 U/L (ref 0–35)
AST: 18 U/L (ref 0–37)
Albumin: 3.8 g/dL (ref 3.5–5.2)
Alkaline Phosphatase: 89 U/L (ref 39–117)
BUN: 29 mg/dL — ABNORMAL HIGH (ref 6–23)
CHLORIDE: 109 meq/L (ref 96–112)
CO2: 22 mEq/L (ref 19–32)
CREATININE: 1.31 mg/dL — AB (ref 0.50–1.10)
Calcium: 8.6 mg/dL (ref 8.4–10.5)
Glucose, Bld: 77 mg/dL (ref 70–99)
Potassium: 4.3 mEq/L (ref 3.5–5.3)
SODIUM: 145 meq/L (ref 135–145)
Total Bilirubin: 0.3 mg/dL (ref 0.2–1.2)
Total Protein: 6.5 g/dL (ref 6.0–8.3)

## 2013-05-28 LAB — CBC WITH DIFFERENTIAL (CANCER CENTER ONLY)
BASO#: 0 10*3/uL (ref 0.0–0.2)
BASO%: 0.3 % (ref 0.0–2.0)
EOS%: 5 % (ref 0.0–7.0)
Eosinophils Absolute: 0.3 10*3/uL (ref 0.0–0.5)
HEMATOCRIT: 35.3 % (ref 34.8–46.6)
HGB: 11.1 g/dL — ABNORMAL LOW (ref 11.6–15.9)
LYMPH#: 2 10*3/uL (ref 0.9–3.3)
LYMPH%: 31.5 % (ref 14.0–48.0)
MCH: 31.4 pg (ref 26.0–34.0)
MCHC: 31.4 g/dL — AB (ref 32.0–36.0)
MCV: 100 fL (ref 81–101)
MONO#: 0.6 10*3/uL (ref 0.1–0.9)
MONO%: 8.8 % (ref 0.0–13.0)
NEUT#: 3.5 10*3/uL (ref 1.5–6.5)
NEUT%: 54.4 % (ref 39.6–80.0)
PLATELETS: 191 10*3/uL (ref 145–400)
RBC: 3.54 10*6/uL — ABNORMAL LOW (ref 3.70–5.32)
RDW: 12.5 % (ref 11.1–15.7)
WBC: 6.4 10*3/uL (ref 3.9–10.0)

## 2013-05-28 LAB — IRON AND TIBC CHCC
%SAT: 26 % (ref 21–57)
Iron: 69 ug/dL (ref 41–142)
TIBC: 266 ug/dL (ref 236–444)
UIBC: 198 ug/dL (ref 120–384)

## 2013-05-28 LAB — CANCER ANTIGEN 27.29: CA 27.29: 25 U/mL (ref 0–39)

## 2013-05-28 LAB — FERRITIN CHCC: Ferritin: 167 ng/ml (ref 9–269)

## 2013-05-28 MED ORDER — CYANOCOBALAMIN 1000 MCG/ML IJ SOLN
1000.0000 ug | Freq: Once | INTRAMUSCULAR | Status: AC
  Administered 2013-05-28: 1000 ug via INTRAMUSCULAR

## 2013-05-28 MED ORDER — HYDROMORPHONE HCL PF 1 MG/ML IJ SOLN
1.0000 mg | Freq: Once | INTRAMUSCULAR | Status: AC
  Administered 2013-05-28: 1 mg via SUBCUTANEOUS
  Filled 2013-05-28: qty 1

## 2013-05-28 MED ORDER — FULVESTRANT 250 MG/5ML IM SOLN
INTRAMUSCULAR | Status: AC
  Filled 2013-05-28: qty 5

## 2013-05-28 MED ORDER — METHYLPREDNISOLONE 4 MG PO KIT: PACK | ORAL | Status: DC

## 2013-05-28 MED ORDER — HYDROMORPHONE HCL PF 2 MG/ML IJ SOLN
INTRAMUSCULAR | Status: AC
  Filled 2013-05-28: qty 1

## 2013-05-28 MED ORDER — FULVESTRANT 250 MG/5ML IM SOLN
500.0000 mg | Freq: Once | INTRAMUSCULAR | Status: AC
  Administered 2013-05-28: 500 mg via INTRAMUSCULAR
  Filled 2013-05-28: qty 10

## 2013-05-28 MED ORDER — CYANOCOBALAMIN 1000 MCG/ML IJ SOLN
INTRAMUSCULAR | Status: AC
  Filled 2013-05-28: qty 1

## 2013-05-28 NOTE — Progress Notes (Signed)
This office note has been dictated.

## 2013-05-29 ENCOUNTER — Ambulatory Visit (HOSPITAL_COMMUNITY): Payer: Medicare Other

## 2013-05-29 ENCOUNTER — Telehealth: Payer: Self-pay | Admitting: Nurse Practitioner

## 2013-05-29 ENCOUNTER — Other Ambulatory Visit: Payer: Self-pay | Admitting: Nurse Practitioner

## 2013-05-29 DIAGNOSIS — D510 Vitamin B12 deficiency anemia due to intrinsic factor deficiency: Secondary | ICD-10-CM

## 2013-05-29 NOTE — Telephone Encounter (Addendum)
Message copied by Jimmy Footman on Thu May 29, 2013 10:21 AM ------      Message from: Burney Gauze R      Created: Wed May 28, 2013  9:41 PM       Call - cancer level ois ok.  Iron is low.  Need FeraHeme 1020mg  x 1 dose.  Call her dgtr to set up.  Pete ------Pt's daughter verbalized understanding and appointment has been set for 2/6 @0830 .

## 2013-05-30 NOTE — Progress Notes (Signed)
DIAGNOSES: 1. Metastatic breast cancer, solitary liver metastasis. 2. Gastric bypass for subsequent B12 and iron deficiency. 3. Osteoarthritis with chronic pain. 4. 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  CURRENT THERAPY: 1. Faslodex 500 mg IM monthly. 2. Vitamin B12 1 mg IM monthly. 3. IV iron as indicated.  INTERIM HISTORY:  [name] comes in for followup.  She looks well today.  She is shivering.  She is having quite a bit of pain.  She has chronic back and leg pain.  I think she does see a pain doctor for this.  She is on Percocet.  I think she is also on Lyrica and Neurontin.  She just has had quite a bit of problems with respect to the back pain.  She unfortunately has not been able to make her appointments on schedule.  Last time I saw her was back in early November.  She got her Faslodex at that point in time.  Her last PET scan was on November 21.  Thankfully, the PET scan showed no evidence of residual or metastatic disease.  She does have the ventral wall hernia, which is chronic.  There is a history of gastric bypass.  She is having some urinary issues.  There is no urinary frequency.  She has had no problems with rashes.  She has had some arthritic issues.  She has some swelling in the ankles and knees.  I will put her on, I think, Medrol Dosepak to see if this can help her out a little bit.  She has had no fever.  She has had no cough.  She has had a lot of nausea and vomiting.  She __________ gastric bypass.  She probably needs to have an upper GI done.  She does not see a gastroenterologist. Again, she probably needs to see a gastroenterologist.  PHYSICAL EXAMINATION:  General:  This is a somewhat ill-appearing, morbidly obese white female in moderate distress secondary to pain. Vital Signs:  Temperature of 97.4, pulse 72, respiratory rate 18, blood pressure 160/80, weight is 287 pounds.  Head and Neck:  Normocephalic, atraumatic skull.  There  are no ocular or oral lesions.  She has no scleral icterus.  There is no thrush.  She has no adenopathy in the neck.  Lungs with some slight decrease at the bases.  Cardiac:  Regular rate and rhythm with normal S1, S2.  There are no murmurs, rubs, or bruits.  Abdomen:  Obese but soft.  She has laparotomy scars.  She has no fluid wave.  She has the abdominal wall hernia.  She has no palpable hepatomegaly.  No splenomegaly.  Back:  Some spasms in the lumbosacral spine.  She does have some scoliosis changes.  Extremities:  Some swelling in the ankles and knees.  There is no erythema or warmth with this swelling.  She has no frank edema in her legs.  She has decreased range of motion of her lower extremity joints.  Neurological:  No focal neurological deficits.  LABORATORY STUDIES:  White cell count is 6.4, hemoglobin 11.1, hematocrit 35.3, platelet count 191.  IMPRESSION:  [name] is a nice 58 year old white female.  She has metastatic breast cancer.  This was documented with biopsy.  She did undergo radiofrequency ablation therapy of this.  This was done back in September 2013.  I have her on Faslodex.  This is in an "adjuvant" fashion, so that we can try to minimize problems with respect to recurrence or progression.  Again, it would be nice if she would do Faslodex monthly.  __________ problems with her as scheduled.  Daughter brings her in.  Daughter is working and sometimes __________.  Again she really looks rough today.  I did not expect all these issues. I am not sure if she really sees a family doctor.  I have to see about getting her set up with an upper GI.  I think this will be helpful __________ with nausea and vomiting.  I gave her a subcutaneous injection of Dilaudid today.  Hopefully, this might help her out.  As far as her chronic pain medication needs are addressed, given her gastric bypass, I do not know if a pain patch would be helpful or not for  her.  Again, she sees Dr. Hardin Negus for her pain management.  I will have to defer to him for these recommendations.  Again, we will give her a subcutaneous injection of Dilaudid 1 mg. Hopefully, this might help with the discomfort that she is having.  I will give her a Medrol Dosepak.  We will give her B12 and Faslodex today.  We will try to get her back in a month.    ______________________________ Volanda Napoleon, M.D. PRE/MEDQ  D:  05/28/2013  T:  05/29/2013  Job:  [id number]

## 2013-06-05 DIAGNOSIS — M48062 Spinal stenosis, lumbar region with neurogenic claudication: Secondary | ICD-10-CM | POA: Diagnosis not present

## 2013-06-05 DIAGNOSIS — G609 Hereditary and idiopathic neuropathy, unspecified: Secondary | ICD-10-CM | POA: Diagnosis not present

## 2013-06-05 DIAGNOSIS — M2550 Pain in unspecified joint: Secondary | ICD-10-CM | POA: Diagnosis not present

## 2013-06-05 DIAGNOSIS — M47817 Spondylosis without myelopathy or radiculopathy, lumbosacral region: Secondary | ICD-10-CM | POA: Diagnosis not present

## 2013-06-06 ENCOUNTER — Ambulatory Visit: Payer: Self-pay

## 2013-06-12 ENCOUNTER — Ambulatory Visit (HOSPITAL_COMMUNITY): Admission: RE | Admit: 2013-06-12 | Payer: Medicare Other | Source: Ambulatory Visit

## 2013-06-16 ENCOUNTER — Ambulatory Visit (HOSPITAL_COMMUNITY): Admission: RE | Admit: 2013-06-16 | Payer: Self-pay | Source: Ambulatory Visit

## 2013-06-23 ENCOUNTER — Ambulatory Visit (HOSPITAL_COMMUNITY): Admission: RE | Admit: 2013-06-23 | Payer: Self-pay | Source: Ambulatory Visit

## 2013-06-24 ENCOUNTER — Telehealth: Payer: Self-pay | Admitting: Hematology & Oncology

## 2013-06-24 NOTE — Telephone Encounter (Signed)
Pt moved 2-25 to 3-2 and r/s swallow to 3-3

## 2013-06-25 ENCOUNTER — Ambulatory Visit: Payer: Self-pay | Admitting: Hematology & Oncology

## 2013-06-25 ENCOUNTER — Other Ambulatory Visit: Payer: Self-pay | Admitting: Lab

## 2013-06-25 ENCOUNTER — Ambulatory Visit: Payer: Self-pay

## 2013-06-30 ENCOUNTER — Encounter: Payer: Self-pay | Admitting: Hematology & Oncology

## 2013-06-30 ENCOUNTER — Ambulatory Visit: Payer: Self-pay

## 2013-06-30 ENCOUNTER — Other Ambulatory Visit: Payer: Self-pay | Admitting: Lab

## 2013-07-01 ENCOUNTER — Ambulatory Visit (HOSPITAL_COMMUNITY)
Admission: RE | Admit: 2013-07-01 | Discharge: 2013-07-01 | Disposition: A | Discharge status: Home / Self Care | Payer: Medicare Other | Source: Ambulatory Visit | Attending: Hematology & Oncology | Admitting: Hematology & Oncology

## 2013-07-01 DIAGNOSIS — K2289 Other specified disease of esophagus: Secondary | ICD-10-CM | POA: Diagnosis not present

## 2013-07-01 DIAGNOSIS — K5730 Diverticulosis of large intestine without perforation or abscess without bleeding: Secondary | ICD-10-CM | POA: Insufficient documentation

## 2013-07-01 DIAGNOSIS — M1990 Unspecified osteoarthritis, unspecified site: Secondary | ICD-10-CM

## 2013-07-01 DIAGNOSIS — K219 Gastro-esophageal reflux disease without esophagitis: Secondary | ICD-10-CM | POA: Insufficient documentation

## 2013-07-01 DIAGNOSIS — K439 Ventral hernia without obstruction or gangrene: Secondary | ICD-10-CM | POA: Diagnosis not present

## 2013-07-01 DIAGNOSIS — Z9884 Bariatric surgery status: Secondary | ICD-10-CM | POA: Insufficient documentation

## 2013-07-01 DIAGNOSIS — R112 Nausea with vomiting, unspecified: Secondary | ICD-10-CM | POA: Diagnosis not present

## 2013-07-01 DIAGNOSIS — C50919 Malignant neoplasm of unspecified site of unspecified female breast: Secondary | ICD-10-CM

## 2013-07-01 DIAGNOSIS — D649 Anemia, unspecified: Secondary | ICD-10-CM

## 2013-07-01 DIAGNOSIS — D510 Vitamin B12 deficiency anemia due to intrinsic factor deficiency: Secondary | ICD-10-CM

## 2013-07-01 DIAGNOSIS — K228 Other specified diseases of esophagus: Secondary | ICD-10-CM | POA: Diagnosis not present

## 2013-07-02 ENCOUNTER — Telehealth: Payer: Self-pay | Admitting: Hematology & Oncology

## 2013-07-02 NOTE — Progress Notes (Signed)
Pt had OVI scheduled on 06-02-13, she was a no-show, I called pt to set up another appointment, she said she though the date was 2-12-. I advised her that Liliane Channel would call her with new date and time.

## 2013-07-02 NOTE — Telephone Encounter (Signed)
Pt made 3-6 lab, inj and 4-3 MD appointments

## 2013-07-03 ENCOUNTER — Encounter: Payer: Self-pay | Admitting: *Deleted

## 2013-07-04 ENCOUNTER — Other Ambulatory Visit: Payer: Self-pay | Admitting: Lab

## 2013-07-04 ENCOUNTER — Ambulatory Visit: Payer: Self-pay

## 2013-07-04 DIAGNOSIS — G609 Hereditary and idiopathic neuropathy, unspecified: Secondary | ICD-10-CM | POA: Diagnosis not present

## 2013-07-04 DIAGNOSIS — M48062 Spinal stenosis, lumbar region with neurogenic claudication: Secondary | ICD-10-CM | POA: Diagnosis not present

## 2013-07-04 DIAGNOSIS — M2550 Pain in unspecified joint: Secondary | ICD-10-CM | POA: Diagnosis not present

## 2013-07-04 DIAGNOSIS — M47817 Spondylosis without myelopathy or radiculopathy, lumbosacral region: Secondary | ICD-10-CM | POA: Diagnosis not present

## 2013-07-11 ENCOUNTER — Ambulatory Visit (HOSPITAL_BASED_OUTPATIENT_CLINIC_OR_DEPARTMENT_OTHER): Payer: Medicare Other

## 2013-07-11 ENCOUNTER — Other Ambulatory Visit (HOSPITAL_BASED_OUTPATIENT_CLINIC_OR_DEPARTMENT_OTHER): Payer: Medicare Other | Admitting: Lab

## 2013-07-11 ENCOUNTER — Telehealth: Payer: Self-pay | Admitting: Hematology & Oncology

## 2013-07-11 DIAGNOSIS — E538 Deficiency of other specified B group vitamins: Secondary | ICD-10-CM | POA: Diagnosis not present

## 2013-07-11 DIAGNOSIS — D649 Anemia, unspecified: Secondary | ICD-10-CM

## 2013-07-11 DIAGNOSIS — C787 Secondary malignant neoplasm of liver and intrahepatic bile duct: Secondary | ICD-10-CM

## 2013-07-11 DIAGNOSIS — D510 Vitamin B12 deficiency anemia due to intrinsic factor deficiency: Secondary | ICD-10-CM

## 2013-07-11 DIAGNOSIS — C50919 Malignant neoplasm of unspecified site of unspecified female breast: Secondary | ICD-10-CM

## 2013-07-11 DIAGNOSIS — R112 Nausea with vomiting, unspecified: Secondary | ICD-10-CM | POA: Diagnosis not present

## 2013-07-11 DIAGNOSIS — Z5111 Encounter for antineoplastic chemotherapy: Secondary | ICD-10-CM

## 2013-07-11 DIAGNOSIS — C50519 Malignant neoplasm of lower-outer quadrant of unspecified female breast: Secondary | ICD-10-CM | POA: Diagnosis not present

## 2013-07-11 DIAGNOSIS — M1990 Unspecified osteoarthritis, unspecified site: Secondary | ICD-10-CM | POA: Diagnosis not present

## 2013-07-11 LAB — CANCER ANTIGEN 27.29: CA 27.29: 21 U/mL (ref 0–39)

## 2013-07-11 LAB — CMP (CANCER CENTER ONLY)
ALK PHOS: 80 U/L (ref 26–84)
ALT(SGPT): 15 U/L (ref 10–47)
AST: 16 U/L (ref 11–38)
Albumin: 2.8 g/dL — ABNORMAL LOW (ref 3.3–5.5)
BUN: 20 mg/dL (ref 7–22)
CALCIUM: 8.9 mg/dL (ref 8.0–10.3)
CHLORIDE: 108 meq/L (ref 98–108)
CO2: 28 mEq/L (ref 18–33)
CREATININE: 0.8 mg/dL (ref 0.6–1.2)
Glucose, Bld: 85 mg/dL (ref 73–118)
Potassium: 4.1 mEq/L (ref 3.3–4.7)
Sodium: 141 mEq/L (ref 128–145)
Total Bilirubin: 0.4 mg/dl (ref 0.20–1.60)
Total Protein: 7.1 g/dL (ref 6.4–8.1)

## 2013-07-11 LAB — CBC WITH DIFFERENTIAL (CANCER CENTER ONLY)
BASO#: 0.1 10*3/uL (ref 0.0–0.2)
BASO%: 0.8 % (ref 0.0–2.0)
EOS%: 3.8 % (ref 0.0–7.0)
Eosinophils Absolute: 0.2 10*3/uL (ref 0.0–0.5)
HCT: 31 % — ABNORMAL LOW (ref 34.8–46.6)
HGB: 9.7 g/dL — ABNORMAL LOW (ref 11.6–15.9)
LYMPH#: 1.6 10*3/uL (ref 0.9–3.3)
LYMPH%: 24.8 % (ref 14.0–48.0)
MCH: 30.8 pg (ref 26.0–34.0)
MCHC: 31.3 g/dL — ABNORMAL LOW (ref 32.0–36.0)
MCV: 98 fL (ref 81–101)
MONO#: 0.7 10*3/uL (ref 0.1–0.9)
MONO%: 10.8 % (ref 0.0–13.0)
NEUT#: 3.8 10*3/uL (ref 1.5–6.5)
NEUT%: 59.8 % (ref 39.6–80.0)
PLATELETS: 363 10*3/uL (ref 145–400)
RBC: 3.15 10*6/uL — AB (ref 3.70–5.32)
RDW: 12.8 % (ref 11.1–15.7)
WBC: 6.3 10*3/uL (ref 3.9–10.0)

## 2013-07-11 LAB — IRON AND TIBC CHCC
%SAT: 18 % — ABNORMAL LOW (ref 21–57)
IRON: 30 ug/dL — AB (ref 41–142)
TIBC: 168 ug/dL — AB (ref 236–444)
UIBC: 138 ug/dL (ref 120–384)

## 2013-07-11 LAB — FERRITIN CHCC: Ferritin: 328 ng/ml — ABNORMAL HIGH (ref 9–269)

## 2013-07-11 MED ORDER — CYANOCOBALAMIN 1000 MCG/ML IJ SOLN
INTRAMUSCULAR | Status: AC
  Filled 2013-07-11: qty 1

## 2013-07-11 MED ORDER — FULVESTRANT 250 MG/5ML IM SOLN
500.0000 mg | Freq: Once | INTRAMUSCULAR | Status: AC
  Administered 2013-07-11: 500 mg via INTRAMUSCULAR
  Filled 2013-07-11: qty 10

## 2013-07-11 MED ORDER — CYANOCOBALAMIN 1000 MCG/ML IJ SOLN
1000.0000 ug | Freq: Once | INTRAMUSCULAR | Status: AC
  Administered 2013-07-11: 1000 ug via INTRAMUSCULAR

## 2013-07-11 MED ORDER — FULVESTRANT 250 MG/5ML IM SOLN
INTRAMUSCULAR | Status: AC
  Filled 2013-07-11: qty 10

## 2013-07-11 NOTE — Telephone Encounter (Signed)
Pt could not come at time for 4-3 she's need earlier. Per MD ok to move to 4-24

## 2013-07-11 NOTE — Patient Instructions (Signed)
fasFulvestrant injection What is this medicine? FULVESTRANT (ful VES trant) blocks the effects of estrogen. It is used to treat breast cancer in women past the age of menopause. This medicine may be used for other purposes; ask your health care provider or pharmacist if you have questions. COMMON BRAND NAME(S): FASLODEX What should I tell my health care provider before I take this medicine? They need to know if you have any of these conditions: -bleeding problems -liver disease -low levels of platelets in the blood -an unusual or allergic reaction to fulvestrant, other medicines, foods, dyes, or preservatives -pregnant or trying to get pregnant -breast-feeding How should I use this medicine? This medicine is for injection into a muscle. It is usually given by a health care professional in a hospital or clinic setting. Talk to your pediatrician regarding the use of this medicine in children. Special care may be needed. Overdosage: If you think you have taken too much of this medicine contact a poison control center or emergency room at once. NOTE: This medicine is only for you. Do not share this medicine with others. What if I miss a dose? It is important not to miss your dose. Call your doctor or health care professional if you are unable to keep an appointment. What may interact with this medicine? -medicines that treat or prevent blood clots like warfarin, enoxaparin, and dalteparin This list may not describe all possible interactions. Give your health care provider a list of all the medicines, herbs, non-prescription drugs, or dietary supplements you use. Also tell them if you smoke, drink alcohol, or use illegal drugs. Some items may interact with your medicine. What should I watch for while using this medicine? Your condition will be monitored carefully while you are receiving this medicine. You will need important blood work done while you are taking this medicine. Do not become  pregnant while taking this medicine. Women should inform their doctor if they wish to become pregnant or think they might be pregnant. There is a potential for serious side effects to an unborn child. Talk to your health care professional or pharmacist for more information. What side effects may I notice from receiving this medicine? Side effects that you should report to your doctor or health care professional as soon as possible: -allergic reactions like skin rash, itching or hives, swelling of the face, lips, or tongue -feeling faint or lightheaded, falls -fever or flu-like symptoms -sore throat -vaginal bleeding Side effects that usually do not require medical attention (report to your doctor or health care professional if they continue or are bothersome): -aches, pains -constipation or diarrhea -headache -hot flashes -nausea, vomiting -pain at site where injected -stomach pain This list may not describe all possible side effects. Call your doctor for medical advice about side effects. You may report side effects to FDA at 1-800-FDA-1088. Where should I keep my medicine? This drug is given in a hospital or clinic and will not be stored at home. NOTE: This sheet is a summary. It may not cover all possible information. If you have questions about this medicine, talk to your doctor, pharmacist, or health care provider.  2014, [employer]vier/Gold Standard. (2007-08-26 15:39:24)

## 2013-07-15 ENCOUNTER — Telehealth: Payer: Self-pay | Admitting: *Deleted

## 2013-07-15 ENCOUNTER — Other Ambulatory Visit: Payer: Self-pay | Admitting: *Deleted

## 2013-07-15 DIAGNOSIS — D510 Vitamin B12 deficiency anemia due to intrinsic factor deficiency: Secondary | ICD-10-CM

## 2013-07-15 NOTE — Telephone Encounter (Signed)
Called patients daughter to let her know that her moms iron levels are low and patient would benefit from one dose of IV iron this week or next.  Appt already made

## 2013-07-17 ENCOUNTER — Telehealth: Payer: Self-pay | Admitting: Hematology & Oncology

## 2013-07-17 ENCOUNTER — Ambulatory Visit: Payer: Self-pay

## 2013-07-17 NOTE — Telephone Encounter (Signed)
Kerri Vasquez moved appointment she is sick

## 2013-07-24 ENCOUNTER — Other Ambulatory Visit: Payer: Self-pay

## 2013-07-24 ENCOUNTER — Ambulatory Visit (HOSPITAL_BASED_OUTPATIENT_CLINIC_OR_DEPARTMENT_OTHER): Payer: Medicare Other

## 2013-07-24 ENCOUNTER — Other Ambulatory Visit: Payer: Self-pay | Admitting: *Deleted

## 2013-07-24 ENCOUNTER — Telehealth: Payer: Self-pay | Admitting: Hematology & Oncology

## 2013-07-24 VITALS — BP 108/66 | HR 83 | Temp 97.0°F | Resp 16

## 2013-07-24 DIAGNOSIS — N644 Mastodynia: Secondary | ICD-10-CM | POA: Diagnosis not present

## 2013-07-24 DIAGNOSIS — D510 Vitamin B12 deficiency anemia due to intrinsic factor deficiency: Secondary | ICD-10-CM

## 2013-07-24 DIAGNOSIS — K9089 Other intestinal malabsorption: Secondary | ICD-10-CM | POA: Diagnosis not present

## 2013-07-24 DIAGNOSIS — D509 Iron deficiency anemia, unspecified: Secondary | ICD-10-CM | POA: Diagnosis not present

## 2013-07-24 DIAGNOSIS — Z978 Presence of other specified devices: Secondary | ICD-10-CM | POA: Diagnosis not present

## 2013-07-24 DIAGNOSIS — G4701 Insomnia due to medical condition: Secondary | ICD-10-CM

## 2013-07-24 DIAGNOSIS — C50919 Malignant neoplasm of unspecified site of unspecified female breast: Secondary | ICD-10-CM

## 2013-07-24 MED ORDER — TEMAZEPAM 15 MG PO CAPS: 15.0000 mg | ORAL_CAPSULE | Freq: Every evening | ORAL | Status: DC | PRN

## 2013-07-24 MED ORDER — HYDROMORPHONE HCL PF 2 MG/ML IJ SOLN
INTRAMUSCULAR | Status: AC
  Filled 2013-07-24: qty 1

## 2013-07-24 MED ORDER — HYDROMORPHONE HCL PF 1 MG/ML IJ SOLN
1.0000 mg | Freq: Once | INTRAMUSCULAR | Status: AC
Start: 2013-07-24 — End: 2013-07-24
  Administered 2013-07-24: 1 mg via SUBCUTANEOUS
  Filled 2013-07-24: qty 1

## 2013-07-24 MED ORDER — SODIUM CHLORIDE 0.9 % IV SOLN
1020.0000 mg | Freq: Once | INTRAVENOUS | Status: AC
  Administered 2013-07-24: 1020 mg via INTRAVENOUS
  Filled 2013-07-24: qty 34

## 2013-07-24 MED ORDER — SODIUM CHLORIDE 0.9 % IV SOLN
Freq: Once | INTRAVENOUS | Status: AC
  Administered 2013-07-24: 10:00:00 via INTRAVENOUS

## 2013-07-24 NOTE — Telephone Encounter (Signed)
Left message for pt to call, for appointment

## 2013-07-24 NOTE — Patient Instructions (Signed)

## 2013-07-25 ENCOUNTER — Ambulatory Visit (HOSPITAL_BASED_OUTPATIENT_CLINIC_OR_DEPARTMENT_OTHER)
Admission: RE | Admit: 2013-07-25 | Discharge: 2013-07-25 | Disposition: A | Discharge status: Home / Self Care | Payer: Medicare Other | Source: Ambulatory Visit | Attending: Hematology & Oncology | Admitting: Hematology & Oncology

## 2013-07-25 DIAGNOSIS — Z978 Presence of other specified devices: Secondary | ICD-10-CM | POA: Insufficient documentation

## 2013-07-25 DIAGNOSIS — Z853 Personal history of malignant neoplasm of breast: Secondary | ICD-10-CM | POA: Diagnosis not present

## 2013-07-25 DIAGNOSIS — D510 Vitamin B12 deficiency anemia due to intrinsic factor deficiency: Secondary | ICD-10-CM

## 2013-07-25 DIAGNOSIS — G4701 Insomnia due to medical condition: Secondary | ICD-10-CM

## 2013-07-25 DIAGNOSIS — C50919 Malignant neoplasm of unspecified site of unspecified female breast: Secondary | ICD-10-CM

## 2013-07-25 DIAGNOSIS — R918 Other nonspecific abnormal finding of lung field: Secondary | ICD-10-CM | POA: Diagnosis not present

## 2013-07-25 NOTE — Progress Notes (Addendum)
DIAGNOSES: 1. Metastatic breast cancer, solitary liver metastasis. 2. Gastric bypass for subsequent B12 and iron deficiency. 3. Osteoarthritis with chronic pain. 4. 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  CURRENT THERAPY: 1. Faslodex 500 mg IM monthly. 2. Vitamin B12 1 mg IM monthly. 3. IV iron as indicated.  INTERIM HISTORY: I saw Kerri Vasquez while she's getting her IV iron. She was complaining of pain in the left breast implant. This has been going on for a little bit. She said that the implant was firm. It was somewhat tender. It was becoming more of a problem for her.  She had no fever. She says the right implant looked like it had migrated.  She had no cough. She had her typical arthritic issues.  She had her recent breast cancer treatment back 2 weeks ago. So far, we've not seen any evidence of recurrence of her breast cancer.     PHYSICAL EXAMINATION:  When I examined her, I noticed that the left breast implant was quite firm. There is some tenderness. There may be some slight swelling. There is no erythema on the chest wall. I cannot palpate any obvious left axillary adenopathy. Her right implant seemed somewhat "flaccid". There is no tenderness. There is no right axillary adenopathy. Lungs were clear. Cardiac exam regular rate and rhythm. Abdomen soft obese. Extremities shows no lymphedema.      LABORATORY STUDIES:  No labs were done today.   IMPRESSION:  I am not sure what is going on with the implants. There are silicone implants. They were placed up in [place].  We will have to get a CT scan of the chest. This will be very interesting to see what we will find. This might be some that a surgeon will have to look at.  I don't think this is anything related to breast cancer.  We will need to set her up with a PET scan.  She will keep her regular appointment with [place].

## 2013-07-26 ENCOUNTER — Encounter (HOSPITAL_COMMUNITY): Payer: Self-pay | Admitting: Emergency Medicine

## 2013-07-26 ENCOUNTER — Emergency Department (HOSPITAL_COMMUNITY)
Admission: EM | Admit: 2013-07-26 | Discharge: 2013-07-26 | Payer: Medicare Other | Attending: Emergency Medicine | Admitting: Emergency Medicine

## 2013-07-26 DIAGNOSIS — T8549XA Other mechanical complication of breast prosthesis and implant, initial encounter: Secondary | ICD-10-CM | POA: Diagnosis not present

## 2013-07-26 DIAGNOSIS — Z862 Personal history of diseases of the blood and blood-forming organs and certain disorders involving the immune mechanism: Secondary | ICD-10-CM | POA: Insufficient documentation

## 2013-07-26 DIAGNOSIS — N61 Mastitis without abscess: Secondary | ICD-10-CM | POA: Diagnosis not present

## 2013-07-26 DIAGNOSIS — T8543XA Leakage of breast prosthesis and implant, initial encounter: Secondary | ICD-10-CM

## 2013-07-26 DIAGNOSIS — IMO0001 Reserved for inherently not codable concepts without codable children: Secondary | ICD-10-CM | POA: Diagnosis not present

## 2013-07-26 DIAGNOSIS — I10 Essential (primary) hypertension: Secondary | ICD-10-CM | POA: Insufficient documentation

## 2013-07-26 DIAGNOSIS — Z85850 Personal history of malignant neoplasm of thyroid: Secondary | ICD-10-CM | POA: Diagnosis not present

## 2013-07-26 DIAGNOSIS — G8929 Other chronic pain: Secondary | ICD-10-CM | POA: Diagnosis not present

## 2013-07-26 DIAGNOSIS — M4120 Other idiopathic scoliosis, site unspecified: Secondary | ICD-10-CM | POA: Insufficient documentation

## 2013-07-26 DIAGNOSIS — K219 Gastro-esophageal reflux disease without esophagitis: Secondary | ICD-10-CM | POA: Insufficient documentation

## 2013-07-26 DIAGNOSIS — M1990 Unspecified osteoarthritis, unspecified site: Secondary | ICD-10-CM | POA: Insufficient documentation

## 2013-07-26 DIAGNOSIS — E119 Type 2 diabetes mellitus without complications: Secondary | ICD-10-CM | POA: Diagnosis not present

## 2013-07-26 DIAGNOSIS — E039 Hypothyroidism, unspecified: Secondary | ICD-10-CM | POA: Insufficient documentation

## 2013-07-26 DIAGNOSIS — Z8505 Personal history of malignant neoplasm of liver: Secondary | ICD-10-CM | POA: Diagnosis not present

## 2013-07-26 DIAGNOSIS — G43909 Migraine, unspecified, not intractable, without status migrainosus: Secondary | ICD-10-CM | POA: Diagnosis not present

## 2013-07-26 DIAGNOSIS — G4730 Sleep apnea, unspecified: Secondary | ICD-10-CM | POA: Diagnosis not present

## 2013-07-26 DIAGNOSIS — Z9884 Bariatric surgery status: Secondary | ICD-10-CM | POA: Diagnosis not present

## 2013-07-26 DIAGNOSIS — T85898A Other specified complication of other internal prosthetic devices, implants and grafts, initial encounter: Secondary | ICD-10-CM | POA: Diagnosis not present

## 2013-07-26 DIAGNOSIS — Z79899 Other long term (current) drug therapy: Secondary | ICD-10-CM | POA: Insufficient documentation

## 2013-07-26 DIAGNOSIS — T8579XA Infection and inflammatory reaction due to other internal prosthetic devices, implants and grafts, initial encounter: Secondary | ICD-10-CM | POA: Diagnosis not present

## 2013-07-26 DIAGNOSIS — Z8719 Personal history of other diseases of the digestive system: Secondary | ICD-10-CM | POA: Diagnosis not present

## 2013-07-26 DIAGNOSIS — Y832 Surgical operation with anastomosis, bypass or graft as the cause of abnormal reaction of the patient, or of later complication, without mention of misadventure at the time of the procedure: Secondary | ICD-10-CM | POA: Insufficient documentation

## 2013-07-26 DIAGNOSIS — Z87448 Personal history of other diseases of urinary system: Secondary | ICD-10-CM | POA: Insufficient documentation

## 2013-07-26 DIAGNOSIS — R0789 Other chest pain: Secondary | ICD-10-CM | POA: Diagnosis not present

## 2013-07-26 DIAGNOSIS — F3289 Other specified depressive episodes: Secondary | ICD-10-CM | POA: Insufficient documentation

## 2013-07-26 DIAGNOSIS — Z923 Personal history of irradiation: Secondary | ICD-10-CM | POA: Insufficient documentation

## 2013-07-26 DIAGNOSIS — F329 Major depressive disorder, single episode, unspecified: Secondary | ICD-10-CM | POA: Diagnosis not present

## 2013-07-26 DIAGNOSIS — N644 Mastodynia: Secondary | ICD-10-CM | POA: Diagnosis not present

## 2013-07-26 DIAGNOSIS — Z853 Personal history of malignant neoplasm of breast: Secondary | ICD-10-CM | POA: Diagnosis not present

## 2013-07-26 LAB — CBC WITH DIFFERENTIAL/PLATELET
Basophils Absolute: 0.1 10*3/uL (ref 0.0–0.1)
Basophils Relative: 1 % (ref 0–1)
EOS PCT: 3 % (ref 0–5)
Eosinophils Absolute: 0.3 10*3/uL (ref 0.0–0.7)
HEMATOCRIT: 31 % — AB (ref 36.0–46.0)
Hemoglobin: 10.1 g/dL — ABNORMAL LOW (ref 12.0–15.0)
LYMPHS ABS: 2 10*3/uL (ref 0.7–4.0)
LYMPHS PCT: 21 % (ref 12–46)
MCH: 30.4 pg (ref 26.0–34.0)
MCHC: 32.6 g/dL (ref 30.0–36.0)
MCV: 93.4 fL (ref 78.0–100.0)
MONO ABS: 0.7 10*3/uL (ref 0.1–1.0)
Monocytes Relative: 7 % (ref 3–12)
Neutro Abs: 6.5 10*3/uL (ref 1.7–7.7)
Neutrophils Relative %: 69 % (ref 43–77)
Platelets: 316 10*3/uL (ref 150–400)
RBC: 3.32 MIL/uL — AB (ref 3.87–5.11)
RDW: 13.4 % (ref 11.5–15.5)
WBC: 9.4 10*3/uL (ref 4.0–10.5)

## 2013-07-26 LAB — BASIC METABOLIC PANEL
BUN: 14 mg/dL (ref 6–23)
CALCIUM: 9.5 mg/dL (ref 8.4–10.5)
CO2: 22 mEq/L (ref 19–32)
CREATININE: 0.8 mg/dL (ref 0.50–1.10)
Chloride: 104 mEq/L (ref 96–112)
GFR calc Af Amer: >90 mL/min (ref >90)
GFR calc non Af Amer: 80 mL/min — ABNORMAL LOW (ref >90)
GLUCOSE: 77 mg/dL (ref 70–99)
Potassium: 4 mEq/L (ref 3.7–5.3)
Sodium: 140 mEq/L (ref 137–147)

## 2013-07-26 LAB — CBG MONITORING, ED
Glucose-Capillary: 57 mg/dL — ABNORMAL LOW (ref 70–99)
Glucose-Capillary: 65 mg/dL — ABNORMAL LOW (ref 70–99)

## 2013-07-26 MED ORDER — VANCOMYCIN HCL 10 G IV SOLR
1250.0000 mg | Freq: Two times a day (BID) | INTRAVENOUS | Status: DC
  Filled 2013-07-26: qty 1250

## 2013-07-26 MED ORDER — HYDROMORPHONE HCL PF 1 MG/ML IJ SOLN
1.0000 mg | Freq: Once | INTRAMUSCULAR | Status: AC
  Administered 2013-07-26: 1 mg via INTRAVENOUS
  Filled 2013-07-26: qty 1

## 2013-07-26 MED ORDER — DEXTROSE 50 % IV SOLN
1.0000 | Freq: Once | INTRAVENOUS | Status: AC
  Administered 2013-07-26: 50 mL via INTRAVENOUS
  Filled 2013-07-26: qty 50

## 2013-07-26 MED ORDER — RANITIDINE HCL 150 MG/10ML PO SYRP
150.0000 mg | ORAL_SOLUTION | Freq: Once | ORAL | Status: AC
  Administered 2013-07-26: 150 mg via ORAL
  Filled 2013-07-26: qty 10

## 2013-07-26 MED ORDER — VANCOMYCIN HCL 10 G IV SOLR
2500.0000 mg | INTRAVENOUS | Status: AC
  Administered 2013-07-26: 2500 mg via INTRAVENOUS
  Filled 2013-07-26: qty 2500

## 2013-07-26 MED ORDER — PIPERACILLIN-TAZOBACTAM 3.375 G IVPB 30 MIN
3.3750 g | INTRAVENOUS | Status: AC
  Administered 2013-07-26: 3.375 g via INTRAVENOUS
  Filled 2013-07-26: qty 50

## 2013-07-26 MED ORDER — PIPERACILLIN-TAZOBACTAM 3.375 G IVPB
3.3750 g | Freq: Three times a day (TID) | INTRAVENOUS | Status: DC
  Filled 2013-07-26 (×2): qty 50

## 2013-07-26 NOTE — ED Notes (Signed)
Pt reports reconstructive silicone implant in L breast that is 59 years old. Saw oncologist today due to pain in L breast and breast feeling hot and "hard as a rock". C/o pain with movement. Oncologist is concerned that implant burst.

## 2013-07-26 NOTE — ED Notes (Signed)
CBG 57 

## 2013-07-26 NOTE — ED Provider Notes (Addendum)
Medical screening examination/treatment/procedure(s) were performed by non-physician practitioner and as supervising physician I was immediately available for consultation/collaboration.   EKG Interpretation None      Angiocath insertion Performed by: Orpah Greek.  Consent: Verbal consent obtained. Risks and benefits: risks, benefits and alternatives were discussed Time out: Immediately prior to procedure a "time out" was called to verify the correct patient, procedure, equipment, support staff and site/side marked as required.  Preparation: Patient was prepped and draped in the usual sterile fashion.  Vein Location: left antecub  Ultrasound Guided  Gauge: 20  Normal blood return and flush without difficulty Patient tolerance: Patient tolerated the procedure well with no immediate complications.      Orpah Greek, MD 07/26/13 [id number]  Orpah Greek, MD 07/26/13 [phone]

## 2013-07-26 NOTE — ED Notes (Signed)
IV infiltrated, RN attempting another IV insertion at this time

## 2013-07-26 NOTE — Progress Notes (Signed)
ANTIBIOTIC CONSULT NOTE - INITIAL  Pharmacy Consult for Vancomycin, Zosyn Indication: ruptured breast implant, r/o infection  Allergies  Allergen Reactions  . Influenza Vac Split [Flu Virus Vaccine] Other (See Comments)    Joint stiffness and renal failure  . Zostavax [Zoster Vaccine Live ([employer])] Other (See Comments)    Joint stiffness, renal failure  . Adhesive [Tape] Itching and Rash    Patient Measurements: Height: 5\' 6"  (167.6 cm) Weight: 286 lb (129.729 kg) IBW/kg (Calculated) : 59.3 Last documented weight: 130.2 kg (05/28/13)  Vital Signs: Temp: 98 F (36.7 C) (03/28 1516) Temp src: Oral (03/28 1516) BP: 142/85 mmHg (03/28 1516) Pulse Rate: 82 (03/28 1516)  Labs:  Recent Labs  07/26/13 1630  WBC 9.4  HGB 10.1*  PLT 316  CREATININE 0.80   Estimated Creatinine Clearance: 105.9 ml/min (by C-G formula based on Cr of 0.8).  Microbiology: No results found for this or any previous visit (from the past 720 hour(s)).  Medical History: Past Medical History  Diagnosis Date  . Depression   . Hypertension   . Migraine   . Hypothyroidism   . Incisional hernia   . Spinal stenosis   . Scoliosis   . Sciatica   . Intestinal obstruction   . GERD (gastroesophageal reflux disease)   . Septic arthritis 01/28/10    and spinal stenosis , moderate scoliosis   . Osteoarthritis of multiple joints   . Anemia   . Pernicious anemia 05/30/2011    Iron transfusion and VB12 on 06/06/11  . Trouble swallowing   . Leg swelling     right   . Abdominal pain   . Constipation   . Nausea & vomiting   . Sleep apnea     hx of off machine x 4 years   . Diabetes mellitus     type II, controlled by diet 05/25/11   . 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 Renal insufficiency     hx renal failure 2011  . Fibromyalgia     Medications:  Anti-infectives   None     Assessment: 73 yoF admitted 3/28 with ruptured silicone breast implant and concern for  superinfection of the fluid around left breast implant.  PMH is significant for metastatic breast cancer (currently on Faslodex 500mg  IM monthly, last on 3/13).  Pharmacy is consulted to dose vancomycin and Zosyn.  Tmax: afeb  WBCs: 9.4  Renal:  SCr 0.8, CrCl > 100 ml/min CG, ~ 87 ml/min Normalized  Blood cultures in process  Goal of Therapy:  Vancomycin trough level 15-20 mcg/ml  Plan:   Zosyn 3.375g IV Q8H infused over 4hrs.  Vancomycin 2500mg  IV x1, then 1250mg  IV q12h.  Measure Vanc trough at steady state.  Follow up renal fxn and culture results.  Gretta Arab PharmD, BCPS Pager [phone] 07/26/2013 6:06 PM

## 2013-07-26 NOTE — ED Provider Notes (Signed)
CSN: [id number]     Arrival date & time 07/26/13  1453 History   First MD Initiated Contact with Patient 07/26/13 1539     Chief Complaint  Patient presents with  . Breast Pain     (Consider location/radiation/quality/duration/timing/severity/associated sxs/prior Treatment) HPI Comments: Patient with metastatic breast cancer with solitary liver metastasis and DM presents to the ED with a chief complaint of breast pain.  She states that 3 weeks ago she noticed that her breast was more firm than usual. She states that she noticed new pain in her left breast a couple of days ago, which she describes as a burning sensation.  She states that 2 days ago, she was seen by Dr. Marin Olp, who was concerned that the patient's silicone breast implant had ruptured.  He ordered a CT scan, which confirmed this suspicion.  Today, the patient states that she was notified of the CT results and was sent to the ED for further evaluation.  She states that the breast is painful and feels "like a rock."  She denies fevers, but states that she has had chills.  The history is provided by the patient. No language interpreter was used.    Past Medical History  Diagnosis Date  . Depression   . Hypertension   . Migraine   . Hypothyroidism   . Incisional hernia   . Spinal stenosis   . Scoliosis   . Sciatica   . Intestinal obstruction   . GERD (gastroesophageal reflux disease)   . Septic arthritis 01/28/10    and spinal stenosis , moderate scoliosis   . Osteoarthritis of multiple joints   . Anemia   . Pernicious anemia 05/30/2011    Iron transfusion and VB12 on 06/06/11  . Trouble swallowing   . Leg swelling     right   . Abdominal pain   . Constipation   . Nausea & vomiting   . Sleep apnea     hx of off machine x 4 years   . Diabetes mellitus     type II, controlled by diet 05/25/11   . 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 Renal insufficiency     hx renal failure 2011  .  Fibromyalgia    Past Surgical History  Procedure Laterality Date  . Tonsillectomy  1969  . Cystectomy  2009    left breast  . Bariatric surgery  2006  . Other surgical history      surgery for intestinal blockage 2011   . Multiple extractions with alveoloplasty  06/08/2011    Procedure: MULTIPLE EXTRACION WITH ALVEOLOPLASTY;  Surgeon: Lenn Cal, DDS;  Location: [facility];  Service: Oral Surgery;  Laterality: N/A;  Extraction of tooth #'s 3,4,7 with alveoloplasty and Gross Debridement of Remaining Teeth  . Gastric bypass  2006  . Intestinal blockage  2011    surgery for this   . Breast surgery  2008     bilateral mastectomy  . Breast reconstruction  2008, 2009  . Cholecystectomy  06/29/85  . Radio frequency ablation to liver  sept 27, 2013  . Thyroid lobectomy  02/16/2012    Procedure: THYROID LOBECTOMY;  Surgeon: Earnstine Regal, MD;  Location: [facility];  Service: General;  Laterality: Right;   Family History  Problem Relation Age of Onset  . Cancer Mother     breast  . Hypertension Mother   . Anesthesia problems Daughter    History  Substance Use Topics  . Smoking status: Never Smoker   . Smokeless tobacco: Never Used     Comment: never used product  . Alcohol Use: No   OB History   Grav Para Term Preterm Abortions TAB SAB Ect Mult Living                 Review of Systems  All other systems reviewed and are negative.      Allergies  Influenza vac split; Zostavax; and Adhesive  Home Medications   Current Outpatient Rx  Name  Route  Sig  Dispense  Refill  . bisacodyl (BISACODYL) 5 MG EC tablet   Oral   Take 10 mg by mouth every evening. 2 5 mg tabs at night         . carvedilol (COREG) 12.5 MG tablet   Oral   Take 12.5 mg by mouth 2 (two) times daily.         . Cholecalciferol 10000 UNITS CAPS      Take 3 times a week.   90 each   6   . FLUoxetine (PROZAC) 40 MG capsule   Oral   Take 40 mg by mouth daily before breakfast.          .  fulvestrant (FASLODEX) 250 MG/5ML injection   Intramuscular   Inject 250 mg into the muscle every 30 (thirty) days. One injection each buttock over 1-2 minutes. Warm prior to use.         . furosemide (LASIX) 20 MG tablet   Oral   Take 1 tablet (20 mg total) by mouth daily.   90 tablet   3   . gabapentin (NEURONTIN) 400 MG capsule   Oral   Take 400 mg by mouth 3 (three) times daily.         Marland Kitchen levothyroxine (SYNTHROID, LEVOTHROID) 300 MCG tablet   Oral   Take 1 tablet (300 mcg total) by mouth daily before breakfast.   30 tablet   2   . lidocaine (XYLOCAINE) 5 % ointment   Topical   Apply 1 application topically as needed. For leg and back pain         . methocarbamol (ROBAXIN) 750 MG tablet   Oral   Take 750 mg by mouth 3 (three) times daily.         . methylPREDNISolone (MEDROL DOSEPAK) 4 MG tablet      follow package directions   21 tablet   0   . pregabalin (LYRICA) 150 MG capsule   Oral   Take 150 mg by mouth 3 (three) times daily.         . promethazine (PHENERGAN) 25 MG tablet   Oral   Take 1 tablet (25 mg total) by mouth every 6 (six) hours as needed. nausea   120 tablet   4   . temazepam (RESTORIL) 15 MG capsule   Oral   Take 1 capsule (15 mg total) by mouth at bedtime as needed for sleep.   30 capsule   3    BP 142/85  Pulse 82  Temp(Src) 98 F (36.7 C) (Oral)  Resp 18  SpO2 97%  LMP 08/30/2006 Physical Exam  Nursing note and vitals reviewed. Constitutional: She is oriented to person, place, and time. She appears well-developed and well-nourished.  HENT:  Head: Normocephalic and atraumatic.  Eyes: Conjunctivae and EOM are normal. Pupils are equal, round, and reactive to light.  Neck: Normal range of motion. Neck supple.  Cardiovascular: Normal rate and regular rhythm.  Exam reveals no gallop and no friction rub.   No murmur heard. Pulmonary/Chest: Effort normal and breath sounds normal. No respiratory distress. She has no wheezes.  She has no rales. She exhibits no tenderness.  Breast exam:  Chaperone present Prior surgical scars from breast reconstruction, left breast is enlarged, warm to the touch, and tender to palpation  Abdominal: Soft. Bowel sounds are normal. She exhibits no distension and no mass. There is no tenderness. There is no rebound and no guarding.  Musculoskeletal: Normal range of motion. She exhibits no edema and no tenderness.  Neurological: She is alert and oriented to person, place, and time.  Skin: Skin is warm and dry.  Psychiatric: She has a normal mood and affect. Her behavior is normal. Judgment and thought content normal.    ED Course  Procedures (including critical care time) Labs Review Labs Reviewed  CBC WITH DIFFERENTIAL  BASIC METABOLIC PANEL   Imaging Review Ct Chest Wo Contrast  07/25/2013   CLINICAL DATA:  Evaluate breast implants. Right-side is deflated. Left implant is hard and tender and warm to touch. History of breast cancer.  EXAM: CT CHEST WITHOUT CONTRAST  TECHNIQUE: Multidetector CT imaging of the chest was performed following the standard protocol without IV contrast.  COMPARISON:  PET-CT from 03/21/2013  FINDINGS: There is no axillary lymphadenopathy. No mediastinal or hilar lymphadenopathy. Heart size is upper normal. No pericardial or pleural effusion.  Lung windows show no focal airspace consolidation. No pulmonary parenchymal nodule or mass.  Bone windows reveal no worrisome lytic or sclerotic osseous lesions.  The right breast implant is unremarkable. The left breast prosthesis shows fluid surrounding an, most notably along the posterior and inferior aspect. Irregularity and infolding of the implant membranes suggests that there may be associated rupture.  IMPRESSION: Fluid surrounding the left breast implant which is potentially record. Given the history of warmth and tenderness, superinfection of the fluid around left breast implant would be a consideration.    Electronically Signed   By: Misty Stanley M.D.   On: 07/25/2013 10:46     EKG Interpretation None      MDM   Final diagnoses:  Breast infection  Ruptured left breast implant      4:15 PM Discussed with Dr. Betsey Holiday, who recommends starting abx and consulting medicine and surgery.  5:58 PM Discussed the patient with Dr. Ninfa Linden from general surgery, who states that the patient needs intervention by a plastic surgeon.  As we do not have any on-call plastic surgeons, I will [facility] to [facility].  6:15 PM Discussed the patient with Dr. Eugene Garnet from plastics at [facility] who will admit the patient.  Will transfer via EMS to maintain IV and abx.  Montine Circle, PA-C 07/26/13 1816

## 2013-07-28 ENCOUNTER — Encounter: Payer: Self-pay | Admitting: Nurse Practitioner

## 2013-07-28 ENCOUNTER — Ambulatory Visit (HOSPITAL_BASED_OUTPATIENT_CLINIC_OR_DEPARTMENT_OTHER): Payer: Medicare Other

## 2013-07-28 VITALS — BP 146/80 | HR 68 | Temp 96.8°F | Resp 16

## 2013-07-28 DIAGNOSIS — C50519 Malignant neoplasm of lower-outer quadrant of unspecified female breast: Secondary | ICD-10-CM

## 2013-07-28 DIAGNOSIS — C787 Secondary malignant neoplasm of liver and intrahepatic bile duct: Secondary | ICD-10-CM | POA: Diagnosis not present

## 2013-07-28 DIAGNOSIS — C50919 Malignant neoplasm of unspecified site of unspecified female breast: Secondary | ICD-10-CM

## 2013-07-28 DIAGNOSIS — T8149XA Infection following a procedure, other surgical site, initial encounter: Secondary | ICD-10-CM

## 2013-07-28 MED ORDER — VANCOMYCIN HCL 1000 MG IV SOLR
2000.0000 mg | Freq: Once | INTRAVENOUS | Status: DC
  Filled 2013-07-28: qty 2000

## 2013-07-28 MED ORDER — HYDROMORPHONE HCL PF 2 MG/ML IJ SOLN
INTRAMUSCULAR | Status: AC
  Filled 2013-07-28: qty 1

## 2013-07-28 MED ORDER — VANCOMYCIN HCL 1000 MG IV SOLR
2000.0000 mg | Freq: Once | INTRAVENOUS | Status: AC
  Administered 2013-07-28: 2000 mg via INTRAVENOUS
  Filled 2013-07-28: qty 2000

## 2013-07-28 MED ORDER — HYDROMORPHONE HCL PF 1 MG/ML IJ SOLN
1.0000 mg | Freq: Once | INTRAMUSCULAR | Status: AC
  Administered 2013-07-28: 1 mg via SUBCUTANEOUS
  Filled 2013-07-28: qty 1

## 2013-07-28 NOTE — Progress Notes (Signed)
This encounter was created in error - please disregard.

## 2013-07-28 NOTE — Progress Notes (Signed)
Pt contacted office and spoke with Dorian Pod regarding her discharge from [facility] on Friday.  I contacted the patient back to gain further details. Pt was seen in [facility] ER and recommended for IV vanc/zosyn. ER PA spoke with Dr. Eugene Garnet regarding pt needing plastic surgery eval and abx. Per pt "[facility] did not have surgeon on-call upon her arrival so she was discharged home and informed she would be called to schedule an appointment." I contacted Dr. Ardath Sax office and spoke to [place] [phone] his nurse. She was unaware of the situation but could review ER notes from [place] has now been scheduled to see the surgeon on Wed 07/30/13 @1pm . As advised by [facility] pharmacy during pt visit pt will come in to office today to receive a dose of Vancomycin, and will also return on Tuesday and Wed to receive Vancomycin dose prior to her appointment with Dr. Eugene Garnet. Spoke back with pt's daughter and she verbalized understanding and appreciation. All records as requested has been sent to Dr. Eugene Garnet @ [phone].

## 2013-07-28 NOTE — Patient Instructions (Signed)
Vancomycin injection What is this medicine? VANCOMYCIN Lucianne Lei koe MYE sin) is a glycopeptide antibiotic. It is used to treat certain kinds of bacterial infections. It will not work for colds, flu, or other viral infections. This medicine may be used for other purposes; ask your health care provider or pharmacist if you have questions. COMMON BRAND NAME(S): Vancocin What should I tell my health care provider before I take this medicine? They need to know if you have any of these conditions: -dehydration -hearing loss -kidney disease -other chronic illness -an unusual or allergic reaction to vancomycin, other medicines, foods, dyes, or preservatives -pregnant or trying to get pregnant -breast-feeding How should I use this medicine? This medicine is infused into a vein. It is usually given by a health care provider in a hospital or clinic. If you receive this medicine at home, you will receive special instructions. Take your medicine at regular intervals. Do not take your medicine more often than directed. Take all of your medicine as directed even if you think you are better. Do not skip doses or stop your medicine early. It is important that you put your used needles and syringes in a special sharps container. Do not put them in a trash can. If you do not have a sharps container, call your pharmacist or healthcare provider to get one. Talk to your pediatrician regarding the use of this medicine in children. While this drug may be prescribed for even very young infants for selected conditions, precautions do apply. Overdosage: If you think you have taken too much of this medicine contact a poison control center or emergency room at once. NOTE: This medicine is only for you. Do not share this medicine with others. What if I miss a dose? If you miss a dose, take it as soon as you can. If it is almost time for your next dose, take only that dose. Do not take double or extra doses. What may interact  with this medicine? -amphotericin B -anesthetics -bacitracin -cisplatin -colistin -diuretics -other aminoglycoside antibiotics -polymyxin B This list may not describe all possible interactions. Give your health care provider a list of all the medicines, herbs, non-prescription drugs, or dietary supplements you use. Also tell them if you smoke, drink alcohol, or use illegal drugs. Some items may interact with your medicine. What should I watch for while using this medicine? Tell your doctor or health care professional if your symptoms do not improve or if you get new symptoms. Your condition and lab work will be monitored while you are taking this medicine. Do not treat diarrhea with over the counter products. Contact your doctor if you have diarrhea that lasts more than 2 days or if it is severe and watery. What side effects may I notice from receiving this medicine? Side effects that you should report to your doctor or health care professional as soon as possible: -allergic reactions like skin rash, itching or hives, swelling of the face, lips, or tongue -breathing difficulty, wheezing -change in amount, color of urine -change in hearing -chest pain -dizziness -fever, chills -flushing of the face and neck (reddening) -low blood pressure -redness, blistering, peeling or loosening of the skin, including inside the mouth -unusual bleeding or bruising -unusually weak or tired Side effects that usually do not require medical attention (report to your doctor or health care professional if they continue or are bothersome): -nausea, vomiting -pain, swelling where injected -stomach cramps This list may not describe all possible side effects. Call your doctor  for medical advice about side effects. You may report side effects to FDA at 1-800-FDA-1088. Where should I keep my medicine? Keep out of the reach of children. You will be instructed on how to store this medicine, if needed. Throw away  any unused medicine after the expiration date on the label. NOTE: This sheet is a summary. It may not cover all possible information. If you have questions about this medicine, talk to your doctor, pharmacist, or health care provider.  2014, Elsevier/Gold Standard. (2008-01-21 14:34:06)

## 2013-07-29 ENCOUNTER — Ambulatory Visit (HOSPITAL_BASED_OUTPATIENT_CLINIC_OR_DEPARTMENT_OTHER): Payer: Medicare Other

## 2013-07-29 VITALS — BP 155/90 | HR 80 | Temp 96.4°F | Resp 16

## 2013-07-29 DIAGNOSIS — C50919 Malignant neoplasm of unspecified site of unspecified female breast: Secondary | ICD-10-CM

## 2013-07-29 DIAGNOSIS — C787 Secondary malignant neoplasm of liver and intrahepatic bile duct: Secondary | ICD-10-CM

## 2013-07-29 DIAGNOSIS — T8149XA Infection following a procedure, other surgical site, initial encounter: Secondary | ICD-10-CM

## 2013-07-29 DIAGNOSIS — N644 Mastodynia: Secondary | ICD-10-CM

## 2013-07-29 DIAGNOSIS — C50519 Malignant neoplasm of lower-outer quadrant of unspecified female breast: Secondary | ICD-10-CM

## 2013-07-29 MED ORDER — HYDROMORPHONE HCL PF 1 MG/ML IJ SOLN
1.0000 mg | Freq: Once | INTRAMUSCULAR | Status: AC
  Administered 2013-07-29: 1 mg via SUBCUTANEOUS
  Filled 2013-07-29: qty 1

## 2013-07-29 MED ORDER — VANCOMYCIN HCL 10 G IV SOLR
2000.0000 mg | Freq: Once | INTRAVENOUS | Status: AC
  Administered 2013-07-29: 2000 mg via INTRAVENOUS
  Filled 2013-07-29: qty 2000

## 2013-07-29 MED ORDER — HYDROMORPHONE HCL PF 2 MG/ML IJ SOLN
INTRAMUSCULAR | Status: AC
  Filled 2013-07-29: qty 1

## 2013-07-29 NOTE — Patient Instructions (Signed)
Vancomycin injection What is this medicine? VANCOMYCIN (van koe MYE sin) is a glycopeptide antibiotic. It is used to treat certain kinds of bacterial infections. It will not work for colds, flu, or other viral infections. This medicine may be used for other purposes; ask your health care provider or pharmacist if you have questions. COMMON BRAND NAME(S): Vancocin What should I tell my health care provider before I take this medicine? They need to know if you have any of these conditions: -dehydration -hearing loss -kidney disease -other chronic illness -an unusual or allergic reaction to vancomycin, other medicines, foods, dyes, or preservatives -pregnant or trying to get pregnant -breast-feeding How should I use this medicine? This medicine is infused into a vein. It is usually given by a health care provider in a hospital or clinic. If you receive this medicine at home, you will receive special instructions. Take your medicine at regular intervals. Do not take your medicine more often than directed. Take all of your medicine as directed even if you think you are better. Do not skip doses or stop your medicine early. It is important that you put your used needles and syringes in a special sharps container. Do not put them in a trash can. If you do not have a sharps container, call your pharmacist or healthcare provider to get one. Talk to your pediatrician regarding the use of this medicine in children. While this drug may be prescribed for even very young infants for selected conditions, precautions do apply. Overdosage: If you think you have taken too much of this medicine contact a poison control center or emergency room at once. NOTE: This medicine is only for you. Do not share this medicine with others. What if I miss a dose? If you miss a dose, take it as soon as you can. If it is almost time for your next dose, take only that dose. Do not take double or extra doses. What may interact  with this medicine? -amphotericin B -anesthetics -bacitracin -cisplatin -colistin -diuretics -other aminoglycoside antibiotics -polymyxin B This list may not describe all possible interactions. Give your health care provider a list of all the medicines, herbs, non-prescription drugs, or dietary supplements you use. Also tell them if you smoke, drink alcohol, or use illegal drugs. Some items may interact with your medicine. What should I watch for while using this medicine? Tell your doctor or health care professional if your symptoms do not improve or if you get new symptoms. Your condition and lab work will be monitored while you are taking this medicine. Do not treat diarrhea with over the counter products. Contact your doctor if you have diarrhea that lasts more than 2 days or if it is severe and watery. What side effects may I notice from receiving this medicine? Side effects that you should report to your doctor or health care professional as soon as possible: -allergic reactions like skin rash, itching or hives, swelling of the face, lips, or tongue -breathing difficulty, wheezing -change in amount, color of urine -change in hearing -chest pain -dizziness -fever, chills -flushing of the face and neck (reddening) -low blood pressure -redness, blistering, peeling or loosening of the skin, including inside the mouth -unusual bleeding or bruising -unusually weak or tired Side effects that usually do not require medical attention (report to your doctor or health care professional if they continue or are bothersome): -nausea, vomiting -pain, swelling where injected -stomach cramps This list may not describe all possible side effects. Call your doctor   for medical advice about side effects. You may report side effects to FDA at 1-800-FDA-1088. Where should I keep my medicine? Keep out of the reach of children. You will be instructed on how to store this medicine, if needed. Throw away  any unused medicine after the expiration date on the label. NOTE: This sheet is a summary. It may not cover all possible information. If you have questions about this medicine, talk to your doctor, pharmacist, or health care provider.  2014, Elsevier/Gold Standard. (2008-01-21 14:34:06)

## 2013-07-30 ENCOUNTER — Ambulatory Visit (HOSPITAL_BASED_OUTPATIENT_CLINIC_OR_DEPARTMENT_OTHER): Payer: Medicare Other

## 2013-07-30 DIAGNOSIS — C50919 Malignant neoplasm of unspecified site of unspecified female breast: Secondary | ICD-10-CM

## 2013-07-30 DIAGNOSIS — C787 Secondary malignant neoplasm of liver and intrahepatic bile duct: Secondary | ICD-10-CM | POA: Diagnosis not present

## 2013-07-30 DIAGNOSIS — N644 Mastodynia: Secondary | ICD-10-CM

## 2013-07-30 DIAGNOSIS — Z853 Personal history of malignant neoplasm of breast: Secondary | ICD-10-CM | POA: Diagnosis not present

## 2013-07-30 DIAGNOSIS — T8149XA Infection following a procedure, other surgical site, initial encounter: Secondary | ICD-10-CM

## 2013-07-30 DIAGNOSIS — C50519 Malignant neoplasm of lower-outer quadrant of unspecified female breast: Secondary | ICD-10-CM

## 2013-07-30 DIAGNOSIS — T8549XA Other mechanical complication of breast prosthesis and implant, initial encounter: Secondary | ICD-10-CM | POA: Diagnosis not present

## 2013-07-30 MED ORDER — HYDROMORPHONE HCL PF 2 MG/ML IJ SOLN
INTRAMUSCULAR | Status: AC
  Filled 2013-07-30: qty 1

## 2013-07-30 MED ORDER — HYDROMORPHONE HCL PF 1 MG/ML IJ SOLN
1.0000 mg | Freq: Once | INTRAMUSCULAR | Status: AC
  Administered 2013-07-30: 1 mg via SUBCUTANEOUS
  Filled 2013-07-30: qty 1

## 2013-07-30 MED ORDER — VANCOMYCIN HCL 1000 MG IV SOLR
2000.0000 mg | Freq: Once | INTRAVENOUS | Status: AC
  Administered 2013-07-30: 2000 mg via INTRAVENOUS
  Filled 2013-07-30: qty 2000

## 2013-07-30 NOTE — Patient Instructions (Signed)
Vancomycin injection What is this medicine? VANCOMYCIN (van koe MYE sin) is a glycopeptide antibiotic. It is used to treat certain kinds of bacterial infections. It will not work for colds, flu, or other viral infections. This medicine may be used for other purposes; ask your health care provider or pharmacist if you have questions. COMMON BRAND NAME(S): Vancocin What should I tell my health care provider before I take this medicine? They need to know if you have any of these conditions: -dehydration -hearing loss -kidney disease -other chronic illness -an unusual or allergic reaction to vancomycin, other medicines, foods, dyes, or preservatives -pregnant or trying to get pregnant -breast-feeding How should I use this medicine? This medicine is infused into a vein. It is usually given by a health care provider in a hospital or clinic. If you receive this medicine at home, you will receive special instructions. Take your medicine at regular intervals. Do not take your medicine more often than directed. Take all of your medicine as directed even if you think you are better. Do not skip doses or stop your medicine early. It is important that you put your used needles and syringes in a special sharps container. Do not put them in a trash can. If you do not have a sharps container, call your pharmacist or healthcare provider to get one. Talk to your pediatrician regarding the use of this medicine in children. While this drug may be prescribed for even very young infants for selected conditions, precautions do apply. Overdosage: If you think you have taken too much of this medicine contact a poison control center or emergency room at once. NOTE: This medicine is only for you. Do not share this medicine with others. What if I miss a dose? If you miss a dose, take it as soon as you can. If it is almost time for your next dose, take only that dose. Do not take double or extra doses. What may interact  with this medicine? -amphotericin B -anesthetics -bacitracin -cisplatin -colistin -diuretics -other aminoglycoside antibiotics -polymyxin B This list may not describe all possible interactions. Give your health care provider a list of all the medicines, herbs, non-prescription drugs, or dietary supplements you use. Also tell them if you smoke, drink alcohol, or use illegal drugs. Some items may interact with your medicine. What should I watch for while using this medicine? Tell your doctor or health care professional if your symptoms do not improve or if you get new symptoms. Your condition and lab work will be monitored while you are taking this medicine. Do not treat diarrhea with over the counter products. Contact your doctor if you have diarrhea that lasts more than 2 days or if it is severe and watery. What side effects may I notice from receiving this medicine? Side effects that you should report to your doctor or health care professional as soon as possible: -allergic reactions like skin rash, itching or hives, swelling of the face, lips, or tongue -breathing difficulty, wheezing -change in amount, color of urine -change in hearing -chest pain -dizziness -fever, chills -flushing of the face and neck (reddening) -low blood pressure -redness, blistering, peeling or loosening of the skin, including inside the mouth -unusual bleeding or bruising -unusually weak or tired Side effects that usually do not require medical attention (report to your doctor or health care professional if they continue or are bothersome): -nausea, vomiting -pain, swelling where injected -stomach cramps This list may not describe all possible side effects. Call your doctor   for medical advice about side effects. You may report side effects to FDA at 1-800-FDA-1088. Where should I keep my medicine? Keep out of the reach of children. You will be instructed on how to store this medicine, if needed. Throw away  any unused medicine after the expiration date on the label. NOTE: This sheet is a summary. It may not cover all possible information. If you have questions about this medicine, talk to your doctor, pharmacist, or health care provider.  2014, Elsevier/Gold Standard. (2008-01-21 14:34:06)

## 2013-08-01 ENCOUNTER — Ambulatory Visit: Payer: Self-pay | Admitting: Hematology & Oncology

## 2013-08-01 ENCOUNTER — Other Ambulatory Visit: Payer: Self-pay | Admitting: Lab

## 2013-08-01 ENCOUNTER — Ambulatory Visit: Payer: Self-pay

## 2013-08-01 LAB — CULTURE, BLOOD (ROUTINE X 2)
CULTURE: NO GROWTH
CULTURE: NO GROWTH

## 2013-08-06 DIAGNOSIS — Z8719 Personal history of other diseases of the digestive system: Secondary | ICD-10-CM | POA: Insufficient documentation

## 2013-08-06 DIAGNOSIS — M797 Fibromyalgia: Secondary | ICD-10-CM | POA: Insufficient documentation

## 2013-08-06 DIAGNOSIS — E119 Type 2 diabetes mellitus without complications: Secondary | ICD-10-CM | POA: Insufficient documentation

## 2013-08-06 DIAGNOSIS — G4733 Obstructive sleep apnea (adult) (pediatric): Secondary | ICD-10-CM | POA: Insufficient documentation

## 2013-08-06 DIAGNOSIS — I10 Essential (primary) hypertension: Secondary | ICD-10-CM | POA: Diagnosis not present

## 2013-08-06 DIAGNOSIS — Z853 Personal history of malignant neoplasm of breast: Secondary | ICD-10-CM | POA: Diagnosis not present

## 2013-08-06 DIAGNOSIS — Z01810 Encounter for preprocedural cardiovascular examination: Secondary | ICD-10-CM | POA: Diagnosis not present

## 2013-08-06 DIAGNOSIS — T8549XA Other mechanical complication of breast prosthesis and implant, initial encounter: Secondary | ICD-10-CM | POA: Diagnosis not present

## 2013-08-08 DIAGNOSIS — E119 Type 2 diabetes mellitus without complications: Secondary | ICD-10-CM | POA: Diagnosis not present

## 2013-08-08 DIAGNOSIS — Z853 Personal history of malignant neoplasm of breast: Secondary | ICD-10-CM | POA: Diagnosis not present

## 2013-08-08 DIAGNOSIS — I10 Essential (primary) hypertension: Secondary | ICD-10-CM | POA: Diagnosis not present

## 2013-08-08 DIAGNOSIS — G4733 Obstructive sleep apnea (adult) (pediatric): Secondary | ICD-10-CM | POA: Diagnosis not present

## 2013-08-08 DIAGNOSIS — Z4430 Encounter for fitting and adjustment of external breast prosthesis, unspecified breast: Secondary | ICD-10-CM | POA: Diagnosis not present

## 2013-08-08 DIAGNOSIS — K219 Gastro-esophageal reflux disease without esophagitis: Secondary | ICD-10-CM | POA: Diagnosis not present

## 2013-08-08 DIAGNOSIS — T8549XA Other mechanical complication of breast prosthesis and implant, initial encounter: Secondary | ICD-10-CM | POA: Diagnosis not present

## 2013-08-08 DIAGNOSIS — T85898A Other specified complication of other internal prosthetic devices, implants and grafts, initial encounter: Secondary | ICD-10-CM | POA: Diagnosis not present

## 2013-08-14 ENCOUNTER — Ambulatory Visit (HOSPITAL_COMMUNITY): Payer: Medicare Other

## 2013-08-22 ENCOUNTER — Ambulatory Visit (HOSPITAL_BASED_OUTPATIENT_CLINIC_OR_DEPARTMENT_OTHER): Payer: Medicare Other | Admitting: Hematology & Oncology

## 2013-08-22 ENCOUNTER — Encounter: Payer: Self-pay | Admitting: Hematology & Oncology

## 2013-08-22 ENCOUNTER — Ambulatory Visit (HOSPITAL_BASED_OUTPATIENT_CLINIC_OR_DEPARTMENT_OTHER): Payer: Medicare Other

## 2013-08-22 ENCOUNTER — Other Ambulatory Visit (HOSPITAL_BASED_OUTPATIENT_CLINIC_OR_DEPARTMENT_OTHER): Payer: Medicare Other | Admitting: Lab

## 2013-08-22 VITALS — BP 108/72 | HR 65 | Resp 16

## 2013-08-22 VITALS — HR 72 | Temp 97.6°F | Resp 16 | Ht 66.0 in | Wt 268.0 lb

## 2013-08-22 DIAGNOSIS — C50519 Malignant neoplasm of lower-outer quadrant of unspecified female breast: Secondary | ICD-10-CM

## 2013-08-22 DIAGNOSIS — D509 Iron deficiency anemia, unspecified: Secondary | ICD-10-CM

## 2013-08-22 DIAGNOSIS — C50919 Malignant neoplasm of unspecified site of unspecified female breast: Secondary | ICD-10-CM

## 2013-08-22 DIAGNOSIS — I959 Hypotension, unspecified: Secondary | ICD-10-CM

## 2013-08-22 DIAGNOSIS — C787 Secondary malignant neoplasm of liver and intrahepatic bile duct: Secondary | ICD-10-CM

## 2013-08-22 DIAGNOSIS — G8929 Other chronic pain: Secondary | ICD-10-CM

## 2013-08-22 DIAGNOSIS — D510 Vitamin B12 deficiency anemia due to intrinsic factor deficiency: Secondary | ICD-10-CM | POA: Diagnosis not present

## 2013-08-22 DIAGNOSIS — Z5111 Encounter for antineoplastic chemotherapy: Secondary | ICD-10-CM | POA: Diagnosis not present

## 2013-08-22 DIAGNOSIS — G4701 Insomnia due to medical condition: Secondary | ICD-10-CM | POA: Diagnosis not present

## 2013-08-22 DIAGNOSIS — E039 Hypothyroidism, unspecified: Secondary | ICD-10-CM | POA: Diagnosis not present

## 2013-08-22 DIAGNOSIS — D649 Anemia, unspecified: Secondary | ICD-10-CM

## 2013-08-22 LAB — IRON AND TIBC CHCC
%SAT: 6 % — AB (ref 21–57)
Iron: 9 ug/dL — ABNORMAL LOW (ref 41–142)
TIBC: 156 ug/dL — ABNORMAL LOW (ref 236–444)
UIBC: 147 ug/dL (ref 120–384)

## 2013-08-22 LAB — CBC WITH DIFFERENTIAL (CANCER CENTER ONLY)
BASO#: 0 10*3/uL (ref 0.0–0.2)
BASO%: 0.1 % (ref 0.0–2.0)
EOS%: 2.6 % (ref 0.0–7.0)
Eosinophils Absolute: 0.4 10*3/uL (ref 0.0–0.5)
HEMATOCRIT: 33.2 % — AB (ref 34.8–46.6)
HGB: 10.6 g/dL — ABNORMAL LOW (ref 11.6–15.9)
LYMPH#: 1.6 10*3/uL (ref 0.9–3.3)
LYMPH%: 11.5 % — AB (ref 14.0–48.0)
MCH: 30.9 pg (ref 26.0–34.0)
MCHC: 31.9 g/dL — ABNORMAL LOW (ref 32.0–36.0)
MCV: 97 fL (ref 81–101)
MONO#: 1.2 10*3/uL — AB (ref 0.1–0.9)
MONO%: 9 % (ref 0.0–13.0)
NEUT#: 10.4 10*3/uL — ABNORMAL HIGH (ref 1.5–6.5)
NEUT%: 76.8 % (ref 39.6–80.0)
Platelets: 187 10*3/uL (ref 145–400)
RBC: 3.43 10*6/uL — ABNORMAL LOW (ref 3.70–5.32)
RDW: 13.6 % (ref 11.1–15.7)
WBC: 13.6 10*3/uL — AB (ref 3.9–10.0)

## 2013-08-22 LAB — CMP (CANCER CENTER ONLY)
ALBUMIN: 3 g/dL — AB (ref 3.3–5.5)
ALK PHOS: 114 U/L — AB (ref 26–84)
ALT: 40 U/L (ref 10–47)
AST: 32 U/L (ref 11–38)
BUN, Bld: 17 mg/dL (ref 7–22)
CO2: 28 mEq/L (ref 18–33)
Calcium: 8.9 mg/dL (ref 8.0–10.3)
Chloride: 103 mEq/L (ref 98–108)
Creat: 1.2 mg/dl (ref 0.6–1.2)
Glucose, Bld: 99 mg/dL (ref 73–118)
POTASSIUM: 3.9 meq/L (ref 3.3–4.7)
Sodium: 139 mEq/L (ref 128–145)
TOTAL PROTEIN: 7.1 g/dL (ref 6.4–8.1)
Total Bilirubin: 0.4 mg/dl (ref 0.20–1.60)

## 2013-08-22 LAB — TSH CHCC: TSH: 0.004 m(IU)/L — ABNORMAL LOW (ref 0.308–3.960)

## 2013-08-22 LAB — FERRITIN CHCC: Ferritin: 1288 ng/ml — ABNORMAL HIGH (ref 9–269)

## 2013-08-22 MED ORDER — CYANOCOBALAMIN 1000 MCG/ML IJ SOLN
1000.0000 ug | Freq: Once | INTRAMUSCULAR | Status: AC
  Administered 2013-08-22: 1000 ug via INTRAMUSCULAR

## 2013-08-22 MED ORDER — FULVESTRANT 250 MG/5ML IM SOLN
500.0000 mg | Freq: Once | INTRAMUSCULAR | Status: AC
  Administered 2013-08-22: 500 mg via INTRAMUSCULAR
  Filled 2013-08-22: qty 10

## 2013-08-22 MED ORDER — CYANOCOBALAMIN 1000 MCG/ML IJ SOLN
INTRAMUSCULAR | Status: AC
  Filled 2013-08-22: qty 1

## 2013-08-22 MED ORDER — FULVESTRANT 250 MG/5ML IM SOLN
INTRAMUSCULAR | Status: AC
  Filled 2013-08-22: qty 10

## 2013-08-22 MED ORDER — SODIUM CHLORIDE 0.9 % IV SOLN
500.0000 mL | Freq: Once | INTRAVENOUS | Status: AC
  Administered 2013-08-22: 500 mL via INTRAVENOUS

## 2013-08-22 NOTE — Patient Instructions (Signed)
Cyanocobalamin, Vitamin B12 injection What is this medicine? CYANOCOBALAMIN (sye an oh koe BAL a min) is a man made form of vitamin B12. Vitamin B12 is used in the growth of healthy blood cells, nerve cells, and proteins in the body. It also helps with the metabolism of fats and carbohydrates. This medicine is used to treat people who can not absorb vitamin B12. This medicine may be used for other purposes; ask your health care provider or pharmacist if you have questions. COMMON BRAND NAME(S): Cyomin, LA-12 , Nutri-Twelve , Primabalt What should I tell my health care provider before I take this medicine? They need to know if you have any of these conditions: -kidney disease -Leber's disease -megaloblastic anemia -an unusual or allergic reaction to cyanocobalamin, cobalt, other medicines, foods, dyes, or preservatives -pregnant or trying to get pregnant -breast-feeding How should I use this medicine? This medicine is injected into a muscle or deeply under the skin. It is usually given by a health care professional in a clinic or doctor's office. However, your doctor may teach you how to inject yourself. Follow all instructions. Talk to your pediatrician regarding the use of this medicine in children. Special care may be needed. Overdosage: If you think you have taken too much of this medicine contact a poison control center or emergency room at once. NOTE: This medicine is only for you. Do not share this medicine with others. What if I miss a dose? If you are given your dose at a clinic or doctor's office, call to reschedule your appointment. If you give your own injections and you miss a dose, take it as soon as you can. If it is almost time for your next dose, take only that dose. Do not take double or extra doses. What may interact with this medicine? -colchicine -heavy alcohol intake This list may not describe all possible interactions. Give your health care provider a list of all the  medicines, herbs, non-prescription drugs, or dietary supplements you use. Also tell them if you smoke, drink alcohol, or use illegal drugs. Some items may interact with your medicine. What should I watch for while using this medicine? Visit your doctor or health care professional regularly. You may need blood work done while you are taking this medicine. You may need to follow a special diet. Talk to your doctor. Limit your alcohol intake and avoid smoking to get the best benefit. What side effects may I notice from receiving this medicine? Side effects that you should report to your doctor or health care professional as soon as possible: -allergic reactions like skin rash, itching or hives, swelling of the face, lips, or tongue -blue tint to skin -chest tightness, pain -difficulty breathing, wheezing -dizziness -red, swollen painful area on the leg Side effects that usually do not require medical attention (report to your doctor or health care professional if they continue or are bothersome): -diarrhea -headache This list may not describe all possible side effects. Call your doctor for medical advice about side effects. You may report side effects to FDA at 1-800-FDA-1088. Where should I keep my medicine? Keep out of the reach of children. Store at room temperature between 15 and 30 degrees C (59 and 85 degrees F). Protect from light. Throw away any unused medicine after the expiration date. NOTE: This sheet is a summary. It may not cover all possible information. If you have questions about this medicine, talk to your doctor, pharmacist, or health care provider.  2014, Elsevier/Gold Standard. (2007-07-29  22:10:20) Fulvestrant injection What is this medicine? FULVESTRANT (ful VES trant) blocks the effects of estrogen. It is used to treat breast cancer in women past the age of menopause. This medicine may be used for other purposes; ask your health care provider or pharmacist if you have  questions. COMMON BRAND NAME(S): FASLODEX What should I tell my health care provider before I take this medicine? They need to know if you have any of these conditions: -bleeding problems -liver disease -low levels of platelets in the blood -an unusual or allergic reaction to fulvestrant, other medicines, foods, dyes, or preservatives -pregnant or trying to get pregnant -breast-feeding How should I use this medicine? This medicine is for injection into a muscle. It is usually given by a health care professional in a hospital or clinic setting. Talk to your pediatrician regarding the use of this medicine in children. Special care may be needed. Overdosage: If you think you have taken too much of this medicine contact a poison control center or emergency room at once. NOTE: This medicine is only for you. Do not share this medicine with others. What if I miss a dose? It is important not to miss your dose. Call your doctor or health care professional if you are unable to keep an appointment. What may interact with this medicine? -medicines that treat or prevent blood clots like warfarin, enoxaparin, and dalteparin This list may not describe all possible interactions. Give your health care provider a list of all the medicines, herbs, non-prescription drugs, or dietary supplements you use. Also tell them if you smoke, drink alcohol, or use illegal drugs. Some items may interact with your medicine. What should I watch for while using this medicine? Your condition will be monitored carefully while you are receiving this medicine. You will need important blood work done while you are taking this medicine. Do not become pregnant while taking this medicine. Women should inform their doctor if they wish to become pregnant or think they might be pregnant. There is a potential for serious side effects to an unborn child. Talk to your health care professional or pharmacist for more information. What side  effects may I notice from receiving this medicine? Side effects that you should report to your doctor or health care professional as soon as possible: -allergic reactions like skin rash, itching or hives, swelling of the face, lips, or tongue -feeling faint or lightheaded, falls -fever or flu-like symptoms -sore throat -vaginal bleeding Side effects that usually do not require medical attention (report to your doctor or health care professional if they continue or are bothersome): -aches, pains -constipation or diarrhea -headache -hot flashes -nausea, vomiting -pain at site where injected -stomach pain This list may not describe all possible side effects. Call your doctor for medical advice about side effects. You may report side effects to FDA at 1-800-FDA-1088. Where should I keep my medicine? This drug is given in a hospital or clinic and will not be stored at home. NOTE: This sheet is a summary. It may not cover all possible information. If you have questions about this medicine, talk to your doctor, pharmacist, or health care provider.  2014, Elsevier/Gold Standard. (2007-08-26 15:39:24)

## 2013-08-22 NOTE — Progress Notes (Signed)
Hematology and Oncology Follow Up Visit  Kerri Vasquez [id number] 16-Feb-1956 58 y.o. 08/22/2013   Principle Diagnosis:  1. Metastatic breast cancer, solitary liver metastasis. 2. Gastric bypass for subsequent B12 and iron deficiency. 3. Osteoarthritis with chronic pain. 4. 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  Current Therapy:   . Faslodex 500 mg IM monthly. 2. Vitamin B12 1 mg IM monthly. 3. IV iron as indicated.     Interim History:  Ms.  Kerri Vasquez is back for follow up. We will last saw her, she had quite a few problems. She had leaking of a left breast implant. She had to go to [facility]. She had this removed. It was leaking. There is some infection noted around this. She completed antibiotics with Keflex yesterday.  She feels quite fatigued. She feels tired. As always, she has pain issues.  She also has been "talking to dead relatives" according to her daughter. This has been going on for a couple weeks. We will probably have to get a scan or MRI of her brain.  She's had no fever. She's had no bleeding. There's been no shortness of breath. She has had some diarrhea over the past couple days.  She still is bothered with quite a lot of of arthritic issues with pain.  Medications: Current outpatient prescriptions:buPROPion (WELLBUTRIN SR) 150 MG 12 hr tablet, Take 150 mg by mouth 2 (two) times daily., Disp: , Rfl: ;  carvedilol (COREG) 12.5 MG tablet, Take 12.5 mg by mouth 2 (two) times daily., Disp: , Rfl: ;  diazepam (VALIUM) 5 MG tablet, Take 5-10 mg by mouth every 8 (eight) hours as needed. , Disp: , Rfl: ;  FLUoxetine (PROZAC) 40 MG capsule, Take 40 mg by mouth daily before breakfast. , Disp: , Rfl:  fulvestrant (FASLODEX) 250 MG/5ML injection, Inject 250 mg into the muscle every 30 (thirty) days. One injection each buttock over 1-2 minutes. Warm prior to use., Disp: , Rfl: ;  levothyroxine (SYNTHROID, LEVOTHROID) 300 MCG tablet, Take 1 tablet (300 mcg total) by  mouth daily before breakfast., Disp: 30 tablet, Rfl: 2;  methocarbamol (ROBAXIN) 750 MG tablet, Take 750 mg by mouth 4 (four) times daily., Disp: , Rfl:  OxyCODONE (OXYCONTIN) 10 mg T12A 12 hr tablet, Take 10 mg by mouth every 8 (eight) hours., Disp: , Rfl: ;  oxyCODONE-acetaminophen (PERCOCET) 10-325 MG per tablet, Take 2 tablets by mouth 4 (four) times daily., Disp: , Rfl: ;  pregabalin (LYRICA) 200 MG capsule, Take 200 mg by mouth 3 (three) times daily., Disp: , Rfl: ;  promethazine (PHENERGAN) 25 MG tablet, Take 25 mg by mouth every 8 (eight) hours as needed for nausea or vomiting., Disp: , Rfl:  temazepam (RESTORIL) 15 MG capsule, Take 1 capsule (15 mg total) by mouth at bedtime as needed for sleep., Disp: 30 capsule, Rfl: 3;  tiZANidine (ZANAFLEX) 4 MG tablet, Take 4 mg by mouth every 6 (six) hours as needed for muscle spasms., Disp: , Rfl:  No current facility-administered medications for this visit. Facility-Administered Medications Ordered in Other Visits: 0.9 %  sodium chloride infusion, 500 mL, Intravenous, Once, Volanda Napoleon, MD, Last Rate: 500 mL/hr at 08/22/13 1124, 500 mL at 08/22/13 1124;  cyanocobalamin ((VITAMIN B-12)) injection 1,000 mcg, 1,000 mcg, Intramuscular, Once, Volanda Napoleon, MD  Allergies:  Allergies  Allergen Reactions  . Influenza Vac Split [Flu Virus Vaccine] Other (See Comments)    Joint stiffness and renal failure  . Zostavax [Zoster Vaccine Live (  [employer])] Other (See Comments)    Joint stiffness, renal failure  . Adhesive [Tape] Itching and Rash    Past Medical History, Surgical history, Social history, and Family History were reviewed and updated.  Review of Systems: As above  Physical Exam:  height is 5\' 6"  (1.676 m) and weight is 268 lb (121.564 kg). Her oral temperature is 97.6 F (36.4 C). Her pulse is 72. Her respiration is 16.   Chronically ill-appearing white female. She is somewhat obese. Head and neck exam shows no ocular or oral lesions.  There is no mucositis. She has no adenopathy in the neck. There is no scleral icterus. Lungs are clear. Cardiac exam regular in rhythm with no murmurs rubs or bruits. Abdomen is soft. She has good bowel sounds. There is no palpable liver or spleen tip. There is no guarding or rebound tenderness. Back exam no tenderness over the spine ribs or hips. Extremities shows osteoarthritic changes. She has some joint swelling in her knees. This is chronic. She doesn't tenderness over joints. Strength is 4/5 bilaterally. Skin exam no rashes. Skin is a little dry. Neurological exam shows no focal neurological deficits.  Lab Results  Component Value Date   WBC 13.6* 08/22/2013   HGB 10.6* 08/22/2013   HCT 33.2* 08/22/2013   MCV 97 08/22/2013   PLT 187 08/22/2013     Chemistry      Component Value Date/Time   NA 139 08/22/2013 1016   NA 140 07/26/2013 1630   K 3.9 08/22/2013 1016   K 4.0 07/26/2013 1630   CL 103 08/22/2013 1016   CL 104 07/26/2013 1630   CO2 28 08/22/2013 1016   CO2 22 07/26/2013 1630   BUN 17 08/22/2013 1016   BUN 14 07/26/2013 1630   CREATININE 1.2 08/22/2013 1016   CREATININE 0.80 07/26/2013 1630      Component Value Date/Time   CALCIUM 8.9 08/22/2013 1016   CALCIUM 9.5 07/26/2013 1630   ALKPHOS 114* 08/22/2013 1016   ALKPHOS 89 05/28/2013 0834   AST 32 08/22/2013 1016   AST 18 05/28/2013 0834   ALT 40 08/22/2013 1016   ALT 11 05/28/2013 0834   BILITOT 0.40 08/22/2013 1016   BILITOT 0.3 05/28/2013 0834         Impression and Plan: [name] is 58 year old white female. She had metastatic breast cancer. She had a solitary lesion in the liver that was ablated by radiation. We have not found any evidence of disease elsewhere.  Am not sure why her blood pressure is so low today. We'll give her some IV fluids. She did have some diarrhea recently. Her lab work done look all that bad. She is on thyroid medication. I don't think she's hypothyroid but this bodies be checked.  We will we will go  ahead and get an in MRI of her brain.  She will get her B12 also.  I spent over a 30 minutes with them today. Of course, she doesn't have a family doctor down here. We have tried to encourage her to get one but this has not worked out all that well. She is going back up to [place] I think in a couple weeks to see family. I think she will see her family doctor up there.  I will see her back in one month, or sooner.   Volanda Napoleon, MD 4/24/201511:30 AM

## 2013-08-23 ENCOUNTER — Ambulatory Visit (HOSPITAL_BASED_OUTPATIENT_CLINIC_OR_DEPARTMENT_OTHER): Payer: Medicare Other

## 2013-08-23 LAB — CANCER ANTIGEN 27.29: CA 27.29: 20 U/mL (ref 0–39)

## 2013-08-25 ENCOUNTER — Ambulatory Visit: Payer: Medicare Other | Admitting: Physical Therapy

## 2013-08-26 ENCOUNTER — Other Ambulatory Visit: Payer: Self-pay | Admitting: *Deleted

## 2013-08-26 ENCOUNTER — Telehealth: Payer: Self-pay | Admitting: Hematology & Oncology

## 2013-08-26 ENCOUNTER — Telehealth: Payer: Self-pay | Admitting: *Deleted

## 2013-08-26 DIAGNOSIS — D510 Vitamin B12 deficiency anemia due to intrinsic factor deficiency: Secondary | ICD-10-CM

## 2013-08-26 NOTE — Telephone Encounter (Signed)
Rn Joellen transferred patient's daughter to me to sch iron apt.  Daughter sch apt for 08/29/13

## 2013-08-26 NOTE — Telephone Encounter (Signed)
Called patients daughter to let her know that her mom needs Feraheme 1020 mg IV in next few weeks per dr. Marin Olp.  Appt made per scheduler.

## 2013-08-26 NOTE — Telephone Encounter (Signed)
Message copied by Rico Ala on Tue Aug 26, 2013  1:48 PM ------      Message from: Volanda Napoleon      Created: Sun Aug 24, 2013  9:10 AM       Call dgtr -- iron is low for her Mom.  Need Ferahme 1020mg  x 1 dose.  Please set up!!  Pete ------

## 2013-08-27 DIAGNOSIS — M2550 Pain in unspecified joint: Secondary | ICD-10-CM | POA: Diagnosis not present

## 2013-08-27 DIAGNOSIS — G609 Hereditary and idiopathic neuropathy, unspecified: Secondary | ICD-10-CM | POA: Diagnosis not present

## 2013-08-27 DIAGNOSIS — N61 Mastitis without abscess: Secondary | ICD-10-CM | POA: Diagnosis not present

## 2013-08-27 DIAGNOSIS — M47817 Spondylosis without myelopathy or radiculopathy, lumbosacral region: Secondary | ICD-10-CM | POA: Diagnosis not present

## 2013-08-27 DIAGNOSIS — M48062 Spinal stenosis, lumbar region with neurogenic claudication: Secondary | ICD-10-CM | POA: Diagnosis not present

## 2013-08-28 ENCOUNTER — Encounter (HOSPITAL_COMMUNITY): Payer: Medicare Other

## 2013-08-29 ENCOUNTER — Ambulatory Visit (HOSPITAL_BASED_OUTPATIENT_CLINIC_OR_DEPARTMENT_OTHER): Payer: Medicare Other

## 2013-08-29 VITALS — BP 140/81 | HR 77 | Temp 97.7°F | Resp 16

## 2013-08-29 DIAGNOSIS — G8929 Other chronic pain: Secondary | ICD-10-CM | POA: Diagnosis not present

## 2013-08-29 DIAGNOSIS — D510 Vitamin B12 deficiency anemia due to intrinsic factor deficiency: Secondary | ICD-10-CM

## 2013-08-29 DIAGNOSIS — D509 Iron deficiency anemia, unspecified: Secondary | ICD-10-CM

## 2013-08-29 MED ORDER — HYDROMORPHONE HCL PF 2 MG/ML IJ SOLN
INTRAMUSCULAR | Status: AC
  Filled 2013-08-29: qty 1

## 2013-08-29 MED ORDER — HYDROMORPHONE HCL PF 2 MG/ML IJ SOLN
1.0000 mg | Freq: Once | INTRAMUSCULAR | Status: AC
Start: 2013-08-29 — End: 2013-08-29
  Administered 2013-08-29: 1 mg via SUBCUTANEOUS

## 2013-08-29 MED ORDER — SODIUM CHLORIDE 0.9 % IV SOLN
Freq: Once | INTRAVENOUS | Status: AC
  Administered 2013-08-29: 11:00:00 via INTRAVENOUS

## 2013-08-29 MED ORDER — FERUMOXYTOL INJECTION 510 MG/17 ML
1020.0000 mg | Freq: Once | INTRAVENOUS | Status: AC
  Administered 2013-08-29: 1020 mg via INTRAVENOUS
  Filled 2013-08-29: qty 34

## 2013-08-30 ENCOUNTER — Ambulatory Visit (HOSPITAL_BASED_OUTPATIENT_CLINIC_OR_DEPARTMENT_OTHER)
Admission: RE | Admit: 2013-08-30 | Discharge: 2013-08-30 | Disposition: A | Discharge status: Home / Self Care | Payer: Medicare Other | Source: Ambulatory Visit | Attending: Hematology & Oncology | Admitting: Hematology & Oncology

## 2013-08-30 DIAGNOSIS — F29 Unspecified psychosis not due to a substance or known physiological condition: Secondary | ICD-10-CM | POA: Insufficient documentation

## 2013-08-30 DIAGNOSIS — C50919 Malignant neoplasm of unspecified site of unspecified female breast: Secondary | ICD-10-CM | POA: Insufficient documentation

## 2013-08-30 DIAGNOSIS — C801 Malignant (primary) neoplasm, unspecified: Secondary | ICD-10-CM | POA: Insufficient documentation

## 2013-08-30 DIAGNOSIS — D649 Anemia, unspecified: Secondary | ICD-10-CM

## 2013-08-30 DIAGNOSIS — E039 Hypothyroidism, unspecified: Secondary | ICD-10-CM

## 2013-08-30 MED ORDER — GADOBENATE DIMEGLUMINE 529 MG/ML IV SOLN: 20.0000 mL | Freq: Once | INTRAVENOUS | Status: AC | PRN

## 2013-09-01 ENCOUNTER — Telehealth: Payer: Self-pay | Admitting: *Deleted

## 2013-09-01 NOTE — Telephone Encounter (Addendum)
Message copied by Lenn Sink on Mon Sep 01, 2013  9:20 AM ------      Message from: Volanda Napoleon      Created: Sun Aug 31, 2013  8:33 PM       Call her dgtr - NO cancer in the brain!! pete ------Informed patient's daughter that MRI showed no cancer in the brain!

## 2013-09-04 ENCOUNTER — Encounter (HOSPITAL_COMMUNITY): Payer: Medicare Other

## 2013-09-04 DIAGNOSIS — T8579XA Infection and inflammatory reaction due to other internal prosthetic devices, implants and grafts, initial encounter: Secondary | ICD-10-CM | POA: Insufficient documentation

## 2013-09-12 DIAGNOSIS — Z9884 Bariatric surgery status: Secondary | ICD-10-CM | POA: Diagnosis not present

## 2013-09-12 DIAGNOSIS — E119 Type 2 diabetes mellitus without complications: Secondary | ICD-10-CM | POA: Diagnosis not present

## 2013-09-12 DIAGNOSIS — E039 Hypothyroidism, unspecified: Secondary | ICD-10-CM | POA: Diagnosis not present

## 2013-09-12 DIAGNOSIS — I890 Lymphedema, not elsewhere classified: Secondary | ICD-10-CM | POA: Diagnosis not present

## 2013-09-12 DIAGNOSIS — I10 Essential (primary) hypertension: Secondary | ICD-10-CM | POA: Diagnosis not present

## 2013-09-12 DIAGNOSIS — Z0000 Encounter for general adult medical examination without abnormal findings: Secondary | ICD-10-CM | POA: Diagnosis not present

## 2013-09-12 DIAGNOSIS — E786 Lipoprotein deficiency: Secondary | ICD-10-CM | POA: Diagnosis not present

## 2013-09-12 DIAGNOSIS — Z8249 Family history of ischemic heart disease and other diseases of the circulatory system: Secondary | ICD-10-CM | POA: Diagnosis not present

## 2013-09-12 DIAGNOSIS — D689 Coagulation defect, unspecified: Secondary | ICD-10-CM | POA: Diagnosis not present

## 2013-09-12 DIAGNOSIS — C50919 Malignant neoplasm of unspecified site of unspecified female breast: Secondary | ICD-10-CM | POA: Diagnosis not present

## 2013-09-12 DIAGNOSIS — D497 Neoplasm of unspecified behavior of endocrine glands and other parts of nervous system: Secondary | ICD-10-CM | POA: Diagnosis not present

## 2013-09-12 DIAGNOSIS — C787 Secondary malignant neoplasm of liver and intrahepatic bile duct: Secondary | ICD-10-CM | POA: Diagnosis not present

## 2013-09-15 ENCOUNTER — Other Ambulatory Visit (HOSPITAL_BASED_OUTPATIENT_CLINIC_OR_DEPARTMENT_OTHER): Payer: Medicare Other | Admitting: Lab

## 2013-09-15 ENCOUNTER — Encounter: Payer: Self-pay | Admitting: Hematology & Oncology

## 2013-09-15 ENCOUNTER — Ambulatory Visit (HOSPITAL_BASED_OUTPATIENT_CLINIC_OR_DEPARTMENT_OTHER): Payer: Medicare Other | Admitting: Hematology & Oncology

## 2013-09-15 ENCOUNTER — Ambulatory Visit (HOSPITAL_BASED_OUTPATIENT_CLINIC_OR_DEPARTMENT_OTHER): Payer: Medicare Other

## 2013-09-15 VITALS — BP 149/67 | HR 78 | Temp 97.9°F | Resp 16 | Ht 67.0 in | Wt 274.0 lb

## 2013-09-15 DIAGNOSIS — E039 Hypothyroidism, unspecified: Secondary | ICD-10-CM

## 2013-09-15 DIAGNOSIS — C50519 Malignant neoplasm of lower-outer quadrant of unspecified female breast: Secondary | ICD-10-CM

## 2013-09-15 DIAGNOSIS — C787 Secondary malignant neoplasm of liver and intrahepatic bile duct: Secondary | ICD-10-CM

## 2013-09-15 DIAGNOSIS — C50919 Malignant neoplasm of unspecified site of unspecified female breast: Secondary | ICD-10-CM | POA: Diagnosis not present

## 2013-09-15 DIAGNOSIS — Z5111 Encounter for antineoplastic chemotherapy: Secondary | ICD-10-CM

## 2013-09-15 DIAGNOSIS — D649 Anemia, unspecified: Secondary | ICD-10-CM | POA: Diagnosis not present

## 2013-09-15 DIAGNOSIS — D509 Iron deficiency anemia, unspecified: Secondary | ICD-10-CM

## 2013-09-15 DIAGNOSIS — D510 Vitamin B12 deficiency anemia due to intrinsic factor deficiency: Secondary | ICD-10-CM

## 2013-09-15 DIAGNOSIS — A4902 Methicillin resistant Staphylococcus aureus infection, unspecified site: Secondary | ICD-10-CM

## 2013-09-15 DIAGNOSIS — E538 Deficiency of other specified B group vitamins: Secondary | ICD-10-CM

## 2013-09-15 LAB — COMPREHENSIVE METABOLIC PANEL
ALT: 17 U/L (ref 0–35)
AST: 19 U/L (ref 0–37)
Albumin: 3.5 g/dL (ref 3.5–5.2)
Alkaline Phosphatase: 64 U/L (ref 39–117)
BILIRUBIN TOTAL: 0.3 mg/dL (ref 0.2–1.2)
BUN: 23 mg/dL (ref 6–23)
CALCIUM: 8.9 mg/dL (ref 8.4–10.5)
CHLORIDE: 107 meq/L (ref 96–112)
CO2: 24 meq/L (ref 19–32)
CREATININE: 1.06 mg/dL (ref 0.50–1.10)
Glucose, Bld: 88 mg/dL (ref 70–99)
Potassium: 4.6 mEq/L (ref 3.5–5.3)
Sodium: 138 mEq/L (ref 135–145)
Total Protein: 6.2 g/dL (ref 6.0–8.3)

## 2013-09-15 LAB — CBC WITH DIFFERENTIAL (CANCER CENTER ONLY)
BASO#: 0 10*3/uL (ref 0.0–0.2)
BASO%: 0.2 % (ref 0.0–2.0)
EOS ABS: 0.3 10*3/uL (ref 0.0–0.5)
EOS%: 4.8 % (ref 0.0–7.0)
HEMATOCRIT: 32.2 % — AB (ref 34.8–46.6)
HGB: 10.4 g/dL — ABNORMAL LOW (ref 11.6–15.9)
LYMPH#: 1.3 10*3/uL (ref 0.9–3.3)
LYMPH%: 25.3 % (ref 14.0–48.0)
MCH: 31.7 pg (ref 26.0–34.0)
MCHC: 32.3 g/dL (ref 32.0–36.0)
MCV: 98 fL (ref 81–101)
MONO#: 0.5 10*3/uL (ref 0.1–0.9)
MONO%: 8.9 % (ref 0.0–13.0)
NEUT#: 3.2 10*3/uL (ref 1.5–6.5)
NEUT%: 60.8 % (ref 39.6–80.0)
Platelets: 181 10*3/uL (ref 145–400)
RBC: 3.28 10*6/uL — ABNORMAL LOW (ref 3.70–5.32)
RDW: 14.9 % (ref 11.1–15.7)
WBC: 5.3 10*3/uL (ref 3.9–10.0)

## 2013-09-15 LAB — IRON AND TIBC CHCC
%SAT: 44 % (ref 21–57)
Iron: 79 ug/dL (ref 41–142)
TIBC: 180 ug/dL — ABNORMAL LOW (ref 236–444)
UIBC: 101 ug/dL — AB (ref 120–384)

## 2013-09-15 LAB — FERRITIN CHCC

## 2013-09-15 LAB — TSH CHCC: TSH: 0.057 m[IU]/L — AB (ref 0.308–3.960)

## 2013-09-15 LAB — CANCER ANTIGEN 27.29: CA 27.29: 24 U/mL (ref 0–39)

## 2013-09-15 MED ORDER — FULVESTRANT 250 MG/5ML IM SOLN
INTRAMUSCULAR | Status: AC
  Filled 2013-09-15: qty 5

## 2013-09-15 MED ORDER — CYANOCOBALAMIN 1000 MCG/ML IJ SOLN
INTRAMUSCULAR | Status: AC
  Filled 2013-09-15: qty 1

## 2013-09-15 MED ORDER — CYANOCOBALAMIN 1000 MCG/ML IJ SOLN
1000.0000 ug | Freq: Once | INTRAMUSCULAR | Status: AC
  Administered 2013-09-15: 1000 ug via INTRAMUSCULAR

## 2013-09-15 MED ORDER — FULVESTRANT 250 MG/5ML IM SOLN
500.0000 mg | Freq: Once | INTRAMUSCULAR | Status: AC
  Administered 2013-09-15: 500 mg via INTRAMUSCULAR
  Filled 2013-09-15: qty 10

## 2013-09-15 NOTE — Progress Notes (Signed)
Hematology and Oncology Follow Up Visit  Kerri Vasquez [id number] [birth year] 58 y.o. 09/15/2013   Principle Diagnosis:  1. Metastatic breast cancer, solitary liver metastasis. 2. Gastric bypass for subsequent B12 and iron deficiency. 3. Osteoarthritis with chronic pain. 4. 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  Current Therapy:   . Faslodex 500 mg IM monthly. 2. Vitamin B12 1 mg IM monthly. 3. IV iron as indicated.     Interim History:  Ms.  Kerri Vasquez is back for followup. And 39, and she is having more problems with his left breast implant. She went to [facility]. She had it removed. She developed a MRSA infection. She has an opening in the left chest wall. This is being addressed by her daughter. She is on antibiotics. Continue Bactrim right now.  Physical by got to [facility] to see the doctor this week.  She clearly is very frustrated with the events that happened regarding this implant.  As far as her breast cancer is concerned, I don't think this is a problem right now. We did not do any scans on her period of the last him we did a scan on was back in November. At that we had some scans set up but she could not make them.  Her CA 27-29 is normal at 24.  She still has issues with the arthritis. She does get B12 and iron. She last got iron back in early May.  Medications: Current outpatient prescriptions:buPROPion (WELLBUTRIN SR) 150 MG 12 hr tablet, Take 150 mg by mouth 2 (two) times daily., Disp: , Rfl: ;  carvedilol (COREG) 12.5 MG tablet, Take 12.5 mg by mouth 2 (two) times daily., Disp: , Rfl: ;  diazepam (VALIUM) 5 MG tablet, Take 5-10 mg by mouth every 8 (eight) hours as needed. , Disp: , Rfl: ;  FLUoxetine (PROZAC) 40 MG capsule, Take 40 mg by mouth daily before breakfast. , Disp: , Rfl:  fulvestrant (FASLODEX) 250 MG/5ML injection, Inject 250 mg into the muscle every 30 (thirty) days. One injection each buttock over 1-2 minutes. Warm prior to use., Disp: , Rfl: ;   levothyroxine (SYNTHROID, LEVOTHROID) 300 MCG tablet, Take 1 tablet (300 mcg total) by mouth daily before breakfast., Disp: 30 tablet, Rfl: 2;  methocarbamol (ROBAXIN) 750 MG tablet, Take 750 mg by mouth 4 (four) times daily., Disp: , Rfl:  OxyCODONE (OXYCONTIN) 10 mg T12A 12 hr tablet, Take 10 mg by mouth every 8 (eight) hours., Disp: , Rfl: ;  oxyCODONE-acetaminophen (PERCOCET) 10-325 MG per tablet, Take 2 tablets by mouth 4 (four) times daily., Disp: , Rfl: ;  pregabalin (LYRICA) 200 MG capsule, Take 200 mg by mouth 3 (three) times daily., Disp: , Rfl: ;  promethazine (PHENERGAN) 25 MG tablet, Take 25 mg by mouth every 8 (eight) hours as needed for nausea or vomiting., Disp: , Rfl:  sulfamethoxazole-trimethoprim (BACTRIM DS) 800-160 MG per tablet, 1 tablet 2 (two) times daily. Take one tablet bid times 5 weeks., Disp: , Rfl: ;  temazepam (RESTORIL) 15 MG capsule, Take 1 capsule (15 mg total) by mouth at bedtime as needed for sleep., Disp: 30 capsule, Rfl: 3;  tiZANidine (ZANAFLEX) 4 MG tablet, Take 4 mg by mouth every 6 (six) hours as needed for muscle spasms., Disp: , Rfl:   Allergies:  Allergies  Allergen Reactions  . Influenza Vac Split [Flu Virus Vaccine] Other (See Comments)    Joint stiffness and renal failure  . Zostavax [Zoster Vaccine Live ([employer])] Other (See Comments)  Joint stiffness, renal failure  . Adhesive [Tape] Itching and Rash    Past Medical History, Surgical history, Social history, and Family History were reviewed and updated.  Review of Systems: As above  Physical Exam:  height is 5\' 7"  (1.702 m) and weight is 274 lb (124.286 kg). Her oral temperature is 97.9 F (36.6 C). Her blood pressure is 149/67 and her pulse is 78. Her respiration is 16.   Obese white female in no obvious distress. Her head neck exam shows no ocular or oral lesions. She is no palpable cervical or supraclavicular lymph nodes. Lungs are clear. Cardiac exam regular in rhythm with no murmurs  rubs or bruits. Chest wall there is a left implant site. It is dressed. There is an opening. There is some clearing secretions. She has no exudate. There is no bleeding. There's a slight thickness of the skin tissue. There is some slight fourth in the left axilla. Right breast is unremarkable. No right axillary adenopathy. Abdomen is soft. Obese. Her good bowel sounds. There is no palpable liver or spleen. Back exam no tenderness over the spine extremities shows no clubbing cyanosis or edema. Neurological exam shows no deficits. Skin exam shows no rashes.  Lab Results  Component Value Date   WBC 5.3 09/15/2013   HGB 10.4* 09/15/2013   HCT 32.2* 09/15/2013   MCV 98 09/15/2013   PLT 181 09/15/2013     Chemistry      Component Value Date/Time   NA 138 09/15/2013 1208   NA 139 08/22/2013 1016   K 4.6 09/15/2013 1208   K 3.9 08/22/2013 1016   CL 107 09/15/2013 1208   CL 103 08/22/2013 1016   CO2 24 09/15/2013 1208   CO2 28 08/22/2013 1016   BUN 23 09/15/2013 1208   BUN 17 08/22/2013 1016   CREATININE 1.06 09/15/2013 1208   CREATININE 1.2 08/22/2013 1016      Component Value Date/Time   CALCIUM 8.9 09/15/2013 1208   CALCIUM 8.9 08/22/2013 1016   ALKPHOS 64 09/15/2013 1208   ALKPHOS 114* 08/22/2013 1016   AST 19 09/15/2013 1208   AST 32 08/22/2013 1016   ALT 17 09/15/2013 1208   ALT 40 08/22/2013 1016   BILITOT 0.3 09/15/2013 1208   BILITOT 0.40 08/22/2013 1016      Ferritin is 1337. Iron saturation is 44%. Total iron is 79.    Impression and Plan: Kerri Vasquez is 58 year old white female. She is side a solitary liver lesion from the breast cancer. She is on Faslodex for this. Again I think she's done very well. She's had no problems. She did get RFA for this lesion will refer to founded. We are doing a Faslodex to try to minimize recurrence.  Clearly, her major issue now is this implant infection. She has MRSA. She's on antibiotics.  We will go ahead and give her B12 today.  She is not any iron. I  will plan to have her come back to see [place] in another month.   Kerri Napoleon, MD 5/18/20155:24 PM

## 2013-09-15 NOTE — Patient Instructions (Signed)
Vitamin B12 Injections Every person needs vitamin B12. A deficiency develops when the body does not get enough of it. One way to overcome this is by getting B12 shots (injections). A B12 shot puts the vitamin directly into muscle tissue. This avoids any problems your body might have in absorbing it from food or a pill. In some people, the body has trouble using the vitamin correctly. This can cause a B12 deficiency. Not consuming enough of the vitamin can also cause a deficiency. Getting enough vitamin B12 can be hard for elderly people. Sometimes, they do not eat a well-balanced diet. The elderly are also more likely than younger people to have medical conditions or take medications that can lead to a deficiency. WHAT DOES VITAMIN B12 DO? Vitamin B12 does many things to help the body work right:  It helps the body make healthy red blood cells.  It helps maintain nerve cells.  It is involved in the body's process of converting food into energy (metabolism).  It is needed to make the genetic material in all cells (DNA). VITAMIN B12 FOOD SOURCES Most people get plenty of vitamin B12 through the foods they eat. It is present in:  Meat, fish, poultry, and eggs.  Milk and milk products.  It also is added when certain foods are made, including some breads, cereals and yogurts. The food is then called "fortified". CAUSES The most common causes of vitamin B12 deficiency are:  Pernicious anemia. The condition develops when the body cannot make enough healthy red blood cells. This stems from a lack of a protein made in the stomach (intrinsic factor). People without this protein cannot absorb enough vitamin B12 from food.  Malabsorption. This is when the body cannot absorb the vitamin. It can be caused by:  Pernicious anemia.  Surgery to remove part or all of the stomach can lead to malabsorption. Removal of part or all of the small intestine can also cause malabsorption.  Vegetarian diet.  People who are strict about not eating foods from animals could have trouble taking in enough vitamin B12 from diet alone.  Medications. Some medicines have been linked to B12 deficiency, such as Metformin (a drug prescribed for type 2 diabetes). Long-term use of stomach acid suppressants also can keep the vitamin from being absorbed.  Intestinal problems such as inflammatory bowel disease. If there are problems in the digestive tract, vitamin B12 may not be absorbed in good enough amounts. SYMPTOMS People who do not get enough B12 can develop problems. These can include:  Anemia. This is when the body has too few red blood cells. Red blood cells carry oxygen to the rest of the body. Without a healthy supply of red blood cells, people can feel:  Tired (fatigued).  Weak.  Severe anemia can cause:  Shortness of breath.  Dizziness.  Rapid heart rate.  Paleness.  Other Vitamin B12 deficiency symptoms include:  Diarrhea.  Numbness or tingling in the hands or feet.  Loss of appetite.  Confusion.  Sores on the tongue or in the mouth. LET YOUR CAREGIVER KNOW ABOUT:  Any allergies. It is very important to know if you are allergic or sensitive to cobalt. Vitamin B12 contains cobalt.  Any history of kidney disease.  All medications you are taking. Include prescription and over-the-counter medicines, herbs and creams.  Whether you are pregnant or breast-feeding.  If you have Leber's disease, a hereditary eye condition, vitamin B12 could make it worse. RISKS AND COMPLICATIONS Reactions to an injection are   usually temporary. They might include:  Pain at the injection site.  Redness, swelling or tenderness at the site.  Headache, dizziness or weakness.  Nausea, upset stomach or diarrhea.  Numbness or tingling.  Fever.  Joint pain.  Itching or rash. If a reaction does not go away in a short while, talk with your healthcare provider. A change in the way the shots are  given, or where they are given, might need to be made. BEFORE AN INJECTION To decide whether B12 injections are right for you, your healthcare provider will probably:  Ask about your medical history.  Ask questions about your diet.  Ask about symptoms such as:  Have you felt weak?  Do you feel unusually tired?  Do you get dizzy?  Order blood tests. These may include a test to:  Check the level of red cells in your blood.  Measure B12 levels.  Check for the presence of intrinsic factor. VITAMIN B12 INJECTIONS How often you will need a vitamin B12 injection will depend on how severe your deficiency is. This also will affect how long you will need to get them. People with pernicious anemia usually get injections for their entire life. Others might get them for a shorter period. For many people, injections are given daily or weekly for several weeks. Then, once B12 levels are normal, injections are given just once a month. If the cause of the deficiency can be fixed, the injections can be stopped. Talk with your healthcare provider about what you should expect. For an injection:  The injection site will be cleaned with an alcohol swab.  Your healthcare provider will insert a needle directly into a muscle. Most any muscle can be used. Most often, an arm muscle is used. A buttocks muscle can also be used. Many people say shots in that area are less painful.  A small adhesive bandage may be put over the injection site. It usually can be taken off in an hour or less. Injections can be given by your healthcare provider. In some cases, family members give them. Sometimes, people give them to themselves. Talk with your healthcare provider about what would be best for you. If someone other than your healthcare provider will be giving the shots, the person will need to be trained to give them correctly. HOME CARE INSTRUCTIONS   You can remove the adhesive bandage within an hour of getting a  shot.  You should be able to go about your normal activities right away.  Avoid drinking large amounts of alcohol while taking vitamin B12 shots. Alcohol can interfere with the body's use of the vitamin. SEEK MEDICAL CARE IF:   Pain, redness, swelling or tenderness at the injection site does not get better or gets worse.  Headache, dizziness or weakness does not go away.  You develop a fever of more than 100.5 F (38.1 C). SEEK IMMEDIATE MEDICAL CARE IF:   You have chest pain.  You develop shortness of breath.  You have muscle weakness that gets worse.  You develop numbness, weakness or tingling on one side or one area of the body.  You have symptoms of an allergic reaction, such as:  Hives.  Difficulty breathing.  Swelling of the lips, face, tongue or throat.  You develop a fever of more than 102.0 F (38.9 C). MAKE SURE YOU:   Understand these instructions.  Will watch your condition.  Will get help right away if you are not doing well or get worse. Document   Released: 07/14/2008 Document Revised: 07/10/2011 Document Reviewed: 07/14/2008 [employer] Patient Information 2014 [employer]. Fulvestrant injection What is this medicine? FULVESTRANT (ful VES trant) blocks the effects of estrogen. It is used to treat breast cancer in women past the age of menopause. This medicine may be used for other purposes; ask your health care provider or pharmacist if you have questions. COMMON BRAND NAME(S): FASLODEX What should I tell my health care provider before I take this medicine? They need to know if you have any of these conditions: -bleeding problems -liver disease -low levels of platelets in the blood -an unusual or allergic reaction to fulvestrant, other medicines, foods, dyes, or preservatives -pregnant or trying to get pregnant -breast-feeding How should I use this medicine? This medicine is for injection into a muscle. It is usually given by a health care  professional in a hospital or clinic setting. Talk to your pediatrician regarding the use of this medicine in children. Special care may be needed. Overdosage: If you think you have taken too much of this medicine contact a poison control center or emergency room at once. NOTE: This medicine is only for you. Do not share this medicine with others. What if I miss a dose? It is important not to miss your dose. Call your doctor or health care professional if you are unable to keep an appointment. What may interact with this medicine? -medicines that treat or prevent blood clots like warfarin, enoxaparin, and dalteparin This list may not describe all possible interactions. Give your health care provider a list of all the medicines, herbs, non-prescription drugs, or dietary supplements you use. Also tell them if you smoke, drink alcohol, or use illegal drugs. Some items may interact with your medicine. What should I watch for while using this medicine? Your condition will be monitored carefully while you are receiving this medicine. You will need important blood work done while you are taking this medicine. Do not become pregnant while taking this medicine. Women should inform their doctor if they wish to become pregnant or think they might be pregnant. There is a potential for serious side effects to an unborn child. Talk to your health care professional or pharmacist for more information. What side effects may I notice from receiving this medicine? Side effects that you should report to your doctor or health care professional as soon as possible: -allergic reactions like skin rash, itching or hives, swelling of the face, lips, or tongue -feeling faint or lightheaded, falls -fever or flu-like symptoms -sore throat -vaginal bleeding Side effects that usually do not require medical attention (report to your doctor or health care professional if they continue or are bothersome): -aches,  pains -constipation or diarrhea -headache -hot flashes -nausea, vomiting -pain at site where injected -stomach pain This list may not describe all possible side effects. Call your doctor for medical advice about side effects. You may report side effects to FDA at 1-800-FDA-1088. Where should I keep my medicine? This drug is given in a hospital or clinic and will not be stored at home. NOTE: This sheet is a summary. It may not cover all possible information. If you have questions about this medicine, talk to your doctor, pharmacist, or health care provider.  2014, [employer]. (2007-08-26 15:39:24)  

## 2013-09-16 ENCOUNTER — Ambulatory Visit (HOSPITAL_COMMUNITY)
Admission: RE | Admit: 2013-09-16 | Discharge: 2013-09-16 | Disposition: A | Discharge status: Home / Self Care | Payer: Medicare Other | Source: Ambulatory Visit | Attending: Hematology & Oncology | Admitting: Hematology & Oncology

## 2013-09-16 ENCOUNTER — Encounter (HOSPITAL_COMMUNITY): Payer: Self-pay

## 2013-09-16 DIAGNOSIS — M5137 Other intervertebral disc degeneration, lumbosacral region: Secondary | ICD-10-CM | POA: Insufficient documentation

## 2013-09-16 DIAGNOSIS — K439 Ventral hernia without obstruction or gangrene: Secondary | ICD-10-CM | POA: Diagnosis not present

## 2013-09-16 DIAGNOSIS — N200 Calculus of kidney: Secondary | ICD-10-CM | POA: Insufficient documentation

## 2013-09-16 DIAGNOSIS — M4120 Other idiopathic scoliosis, site unspecified: Secondary | ICD-10-CM | POA: Diagnosis not present

## 2013-09-16 DIAGNOSIS — M51379 Other intervertebral disc degeneration, lumbosacral region without mention of lumbar back pain or lower extremity pain: Secondary | ICD-10-CM | POA: Insufficient documentation

## 2013-09-16 DIAGNOSIS — C50919 Malignant neoplasm of unspecified site of unspecified female breast: Secondary | ICD-10-CM | POA: Insufficient documentation

## 2013-09-16 DIAGNOSIS — K5900 Constipation, unspecified: Secondary | ICD-10-CM | POA: Diagnosis not present

## 2013-09-16 DIAGNOSIS — D510 Vitamin B12 deficiency anemia due to intrinsic factor deficiency: Secondary | ICD-10-CM

## 2013-09-16 DIAGNOSIS — G4701 Insomnia due to medical condition: Secondary | ICD-10-CM

## 2013-09-16 LAB — GLUCOSE, CAPILLARY: GLUCOSE-CAPILLARY: 75 mg/dL (ref 70–99)

## 2013-09-16 MED ORDER — FLUDEOXYGLUCOSE F - 18 (FDG) INJECTION
12.2000 | Freq: Once | INTRAVENOUS | Status: AC | PRN
  Administered 2013-09-16: 12.2 via INTRAVENOUS

## 2013-09-17 ENCOUNTER — Telehealth: Payer: Self-pay | Admitting: Hematology & Oncology

## 2013-09-17 NOTE — Telephone Encounter (Signed)
Pt aware of 6-22 appointment

## 2013-09-18 ENCOUNTER — Encounter: Payer: Self-pay | Admitting: *Deleted

## 2013-09-25 DIAGNOSIS — M2550 Pain in unspecified joint: Secondary | ICD-10-CM | POA: Diagnosis not present

## 2013-09-25 DIAGNOSIS — M47817 Spondylosis without myelopathy or radiculopathy, lumbosacral region: Secondary | ICD-10-CM | POA: Diagnosis not present

## 2013-09-25 DIAGNOSIS — M48062 Spinal stenosis, lumbar region with neurogenic claudication: Secondary | ICD-10-CM | POA: Diagnosis not present

## 2013-09-25 DIAGNOSIS — G609 Hereditary and idiopathic neuropathy, unspecified: Secondary | ICD-10-CM | POA: Diagnosis not present

## 2013-10-10 ENCOUNTER — Other Ambulatory Visit: Payer: Self-pay | Admitting: Nurse Practitioner

## 2013-10-10 MED ORDER — LEVOTHYROXINE SODIUM 300 MCG PO TABS: 300.0000 ug | ORAL_TABLET | Freq: Every day | ORAL | Status: DC

## 2013-10-20 ENCOUNTER — Other Ambulatory Visit: Payer: Self-pay | Admitting: Lab

## 2013-10-20 ENCOUNTER — Ambulatory Visit: Payer: Self-pay

## 2013-10-20 ENCOUNTER — Telehealth: Payer: Self-pay | Admitting: Hematology & Oncology

## 2013-10-20 ENCOUNTER — Ambulatory Visit: Payer: Self-pay | Admitting: Hematology & Oncology

## 2013-10-20 NOTE — Telephone Encounter (Signed)
PT MOVED 6-22 TO 7-1

## 2013-10-23 DIAGNOSIS — M47817 Spondylosis without myelopathy or radiculopathy, lumbosacral region: Secondary | ICD-10-CM | POA: Diagnosis not present

## 2013-10-23 DIAGNOSIS — M2550 Pain in unspecified joint: Secondary | ICD-10-CM | POA: Diagnosis not present

## 2013-10-23 DIAGNOSIS — M48062 Spinal stenosis, lumbar region with neurogenic claudication: Secondary | ICD-10-CM | POA: Diagnosis not present

## 2013-10-29 ENCOUNTER — Other Ambulatory Visit: Payer: Self-pay | Admitting: Lab

## 2013-10-29 ENCOUNTER — Ambulatory Visit: Payer: Self-pay

## 2013-10-29 ENCOUNTER — Telehealth: Payer: Self-pay | Admitting: Hematology & Oncology

## 2013-10-29 ENCOUNTER — Ambulatory Visit: Payer: Self-pay | Admitting: Hematology & Oncology

## 2013-10-29 NOTE — Telephone Encounter (Signed)
Pt moved 7-1 to 7-27 Kerri Vasquez is sick

## 2013-11-03 ENCOUNTER — Other Ambulatory Visit: Payer: Self-pay | Admitting: Hematology & Oncology

## 2013-11-20 DIAGNOSIS — G609 Hereditary and idiopathic neuropathy, unspecified: Secondary | ICD-10-CM | POA: Diagnosis not present

## 2013-11-20 DIAGNOSIS — M47817 Spondylosis without myelopathy or radiculopathy, lumbosacral region: Secondary | ICD-10-CM | POA: Diagnosis not present

## 2013-11-20 DIAGNOSIS — M2550 Pain in unspecified joint: Secondary | ICD-10-CM | POA: Diagnosis not present

## 2013-11-20 DIAGNOSIS — M48062 Spinal stenosis, lumbar region with neurogenic claudication: Secondary | ICD-10-CM | POA: Diagnosis not present

## 2013-11-20 DIAGNOSIS — Z79899 Other long term (current) drug therapy: Secondary | ICD-10-CM | POA: Diagnosis not present

## 2013-11-24 ENCOUNTER — Ambulatory Visit: Payer: Self-pay | Admitting: Hematology & Oncology

## 2013-11-24 ENCOUNTER — Other Ambulatory Visit: Payer: Self-pay | Admitting: Lab

## 2013-11-24 ENCOUNTER — Ambulatory Visit: Payer: Self-pay

## 2013-11-25 ENCOUNTER — Ambulatory Visit (HOSPITAL_BASED_OUTPATIENT_CLINIC_OR_DEPARTMENT_OTHER): Payer: Medicare Other | Admitting: Family

## 2013-11-25 ENCOUNTER — Other Ambulatory Visit (HOSPITAL_BASED_OUTPATIENT_CLINIC_OR_DEPARTMENT_OTHER): Payer: Medicare Other | Admitting: Lab

## 2013-11-25 ENCOUNTER — Other Ambulatory Visit: Payer: Self-pay | Admitting: Nurse Practitioner

## 2013-11-25 ENCOUNTER — Encounter: Payer: Self-pay | Admitting: Nurse Practitioner

## 2013-11-25 ENCOUNTER — Encounter: Payer: Self-pay | Admitting: Family

## 2013-11-25 ENCOUNTER — Ambulatory Visit (HOSPITAL_BASED_OUTPATIENT_CLINIC_OR_DEPARTMENT_OTHER): Payer: Medicare Other

## 2013-11-25 VITALS — BP 114/76 | HR 57 | Temp 96.8°F | Resp 20 | Wt 257.0 lb

## 2013-11-25 DIAGNOSIS — C50919 Malignant neoplasm of unspecified site of unspecified female breast: Secondary | ICD-10-CM

## 2013-11-25 DIAGNOSIS — C787 Secondary malignant neoplasm of liver and intrahepatic bile duct: Secondary | ICD-10-CM

## 2013-11-25 DIAGNOSIS — D509 Iron deficiency anemia, unspecified: Secondary | ICD-10-CM

## 2013-11-25 DIAGNOSIS — C50519 Malignant neoplasm of lower-outer quadrant of unspecified female breast: Secondary | ICD-10-CM

## 2013-11-25 DIAGNOSIS — D510 Vitamin B12 deficiency anemia due to intrinsic factor deficiency: Secondary | ICD-10-CM

## 2013-11-25 DIAGNOSIS — G8929 Other chronic pain: Secondary | ICD-10-CM

## 2013-11-25 DIAGNOSIS — G4701 Insomnia due to medical condition: Secondary | ICD-10-CM

## 2013-11-25 DIAGNOSIS — Z5111 Encounter for antineoplastic chemotherapy: Secondary | ICD-10-CM

## 2013-11-25 DIAGNOSIS — E538 Deficiency of other specified B group vitamins: Secondary | ICD-10-CM | POA: Diagnosis not present

## 2013-11-25 LAB — CMP (CANCER CENTER ONLY)
ALBUMIN: 3.4 g/dL (ref 3.3–5.5)
ALK PHOS: 68 U/L (ref 26–84)
ALT(SGPT): 10 U/L (ref 10–47)
AST: 18 U/L (ref 11–38)
BILIRUBIN TOTAL: 0.4 mg/dL (ref 0.20–1.60)
BUN, Bld: 31 mg/dL — ABNORMAL HIGH (ref 7–22)
CO2: 29 mEq/L (ref 18–33)
Calcium: 9 mg/dL (ref 8.0–10.3)
Chloride: 102 mEq/L (ref 98–108)
Creat: 1.3 mg/dl — ABNORMAL HIGH (ref 0.6–1.2)
Glucose, Bld: 94 mg/dL (ref 73–118)
POTASSIUM: 3.9 meq/L (ref 3.3–4.7)
SODIUM: 144 meq/L (ref 128–145)
TOTAL PROTEIN: 7.1 g/dL (ref 6.4–8.1)

## 2013-11-25 LAB — CBC WITH DIFFERENTIAL (CANCER CENTER ONLY)
BASO#: 0 10*3/uL (ref 0.0–0.2)
BASO%: 0.8 % (ref 0.0–2.0)
EOS ABS: 0.2 10*3/uL (ref 0.0–0.5)
EOS%: 3.9 % (ref 0.0–7.0)
HCT: 37.2 % (ref 34.8–46.6)
HGB: 12.3 g/dL (ref 11.6–15.9)
LYMPH#: 1.9 10*3/uL (ref 0.9–3.3)
LYMPH%: 36.8 % (ref 14.0–48.0)
MCH: 32.3 pg (ref 26.0–34.0)
MCHC: 33.1 g/dL (ref 32.0–36.0)
MCV: 98 fL (ref 81–101)
MONO#: 0.4 10*3/uL (ref 0.1–0.9)
MONO%: 8.4 % (ref 0.0–13.0)
NEUT#: 2.6 10*3/uL (ref 1.5–6.5)
NEUT%: 50.1 % (ref 39.6–80.0)
Platelets: 150 10*3/uL (ref 145–400)
RBC: 3.81 10*6/uL (ref 3.70–5.32)
RDW: 14.1 % (ref 11.1–15.7)
WBC: 5.1 10*3/uL (ref 3.9–10.0)

## 2013-11-25 LAB — IRON AND TIBC CHCC
%SAT: 40 % (ref 21–57)
Iron: 87 ug/dL (ref 41–142)
TIBC: 216 ug/dL — AB (ref 236–444)
UIBC: 130 ug/dL (ref 120–384)

## 2013-11-25 LAB — FERRITIN CHCC: Ferritin: 707 ng/ml — ABNORMAL HIGH (ref 9–269)

## 2013-11-25 LAB — CANCER ANTIGEN 27.29: CA 27.29: 25 U/mL (ref 0–39)

## 2013-11-25 MED ORDER — CYANOCOBALAMIN 1000 MCG/ML IJ SOLN
INTRAMUSCULAR | Status: AC
  Filled 2013-11-25: qty 1

## 2013-11-25 MED ORDER — CYANOCOBALAMIN 1000 MCG/ML IJ SOLN
1000.0000 ug | Freq: Once | INTRAMUSCULAR | Status: AC
  Administered 2013-11-25: 1000 ug via INTRAMUSCULAR

## 2013-11-25 MED ORDER — PROMETHAZINE HCL 25 MG PO TABS: 25.0000 mg | ORAL_TABLET | Freq: Three times a day (TID) | ORAL | Status: DC | PRN

## 2013-11-25 MED ORDER — TEMAZEPAM 15 MG PO CAPS
15.0000 mg | ORAL_CAPSULE | Freq: Every evening | ORAL | Status: DC | PRN
Start: 2013-11-25 — End: 2014-06-03

## 2013-11-25 MED ORDER — FULVESTRANT 250 MG/5ML IM SOLN
INTRAMUSCULAR | Status: AC
  Filled 2013-11-25: qty 10

## 2013-11-25 MED ORDER — FULVESTRANT 250 MG/5ML IM SOLN
500.0000 mg | Freq: Once | INTRAMUSCULAR | Status: AC
  Administered 2013-11-25: 500 mg via INTRAMUSCULAR
  Filled 2013-11-25: qty 10

## 2013-11-25 MED ORDER — HYDROMORPHONE HCL PF 1 MG/ML IJ SOLN
2.0000 mg | Freq: Once | INTRAMUSCULAR | Status: AC
  Administered 2013-11-25: 2 mg via SUBCUTANEOUS
  Filled 2013-11-25: qty 2

## 2013-11-25 MED ORDER — HYDROMORPHONE HCL PF 2 MG/ML IJ SOLN
INTRAMUSCULAR | Status: AC
  Filled 2013-11-25: qty 1

## 2013-11-25 MED ORDER — LEVOTHYROXINE SODIUM 300 MCG PO TABS: 300.0000 ug | ORAL_TABLET | Freq: Every day | ORAL | Status: DC

## 2013-11-25 MED ORDER — FUROSEMIDE 20 MG PO TABS: ORAL_TABLET | ORAL | Status: DC

## 2013-11-25 NOTE — Patient Instructions (Signed)
Vitamin B12 Injections Every person needs vitamin B12. A deficiency develops when the body does not get enough of it. One way to overcome this is by getting B12 shots (injections). A B12 shot puts the vitamin directly into muscle tissue. This avoids any problems your body might have in absorbing it from food or a pill. In some people, the body has trouble using the vitamin correctly. This can cause a B12 deficiency. Not consuming enough of the vitamin can also cause a deficiency. Getting enough vitamin B12 can be hard for elderly people. Sometimes, they do not eat a well-balanced diet. The elderly are also more likely than younger people to have medical conditions or take medications that can lead to a deficiency. WHAT DOES VITAMIN B12 DO? Vitamin B12 does many things to help the body work right:  It helps the body make healthy red blood cells.  It helps maintain nerve cells.  It is involved in the body's process of converting food into energy (metabolism).  It is needed to make the genetic material in all cells (DNA). VITAMIN B12 FOOD SOURCES Most people get plenty of vitamin B12 through the foods they eat. It is present in:  Meat, fish, poultry, and eggs.  Milk and milk products.  It also is added when certain foods are made, including some breads, cereals and yogurts. The food is then called "fortified". CAUSES The most common causes of vitamin B12 deficiency are:  Pernicious anemia. The condition develops when the body cannot make enough healthy red blood cells. This stems from a lack of a protein made in the stomach (intrinsic factor). People without this protein cannot absorb enough vitamin B12 from food.  Malabsorption. This is when the body cannot absorb the vitamin. It can be caused by:  Pernicious anemia.  Surgery to remove part or all of the stomach can lead to malabsorption. Removal of part or all of the small intestine can also cause malabsorption.  Vegetarian diet.  People who are strict about not eating foods from animals could have trouble taking in enough vitamin B12 from diet alone.  Medications. Some medicines have been linked to B12 deficiency, such as Metformin (a drug prescribed for type 2 diabetes). Long-term use of stomach acid suppressants also can keep the vitamin from being absorbed.  Intestinal problems such as inflammatory bowel disease. If there are problems in the digestive tract, vitamin B12 may not be absorbed in good enough amounts. SYMPTOMS People who do not get enough B12 can develop problems. These can include:  Anemia. This is when the body has too few red blood cells. Red blood cells carry oxygen to the rest of the body. Without a healthy supply of red blood cells, people can feel:  Tired (fatigued).  Weak.  Severe anemia can cause:  Shortness of breath.  Dizziness.  Rapid heart rate.  Paleness.  Other Vitamin B12 deficiency symptoms include:  Diarrhea.  Numbness or tingling in the hands or feet.  Loss of appetite.  Confusion.  Sores on the tongue or in the mouth. LET YOUR CAREGIVER KNOW ABOUT:  Any allergies. It is very important to know if you are allergic or sensitive to cobalt. Vitamin B12 contains cobalt.  Any history of kidney disease.  All medications you are taking. Include prescription and over-the-counter medicines, herbs and creams.  Whether you are pregnant or breast-feeding.  If you have Leber's disease, a hereditary eye condition, vitamin B12 could make it worse. RISKS AND COMPLICATIONS Reactions to an injection are   usually temporary. They might include:  Pain at the injection site.  Redness, swelling or tenderness at the site.  Headache, dizziness or weakness.  Nausea, upset stomach or diarrhea.  Numbness or tingling.  Fever.  Joint pain.  Itching or rash. If a reaction does not go away in a short while, talk with your healthcare provider. A change in the way the shots are  given, or where they are given, might need to be made. BEFORE AN INJECTION To decide whether B12 injections are right for you, your healthcare provider will probably:  Ask about your medical history.  Ask questions about your diet.  Ask about symptoms such as:  Have you felt weak?  Do you feel unusually tired?  Do you get dizzy?  Order blood tests. These may include a test to:  Check the level of red cells in your blood.  Measure B12 levels.  Check for the presence of intrinsic factor. VITAMIN B12 INJECTIONS How often you will need a vitamin B12 injection will depend on how severe your deficiency is. This also will affect how long you will need to get them. People with pernicious anemia usually get injections for their entire life. Others might get them for a shorter period. For many people, injections are given daily or weekly for several weeks. Then, once B12 levels are normal, injections are given just once a month. If the cause of the deficiency can be fixed, the injections can be stopped. Talk with your healthcare provider about what you should expect. For an injection:  The injection site will be cleaned with an alcohol swab.  Your healthcare provider will insert a needle directly into a muscle. Most any muscle can be used. Most often, an arm muscle is used. A buttocks muscle can also be used. Many people say shots in that area are less painful.  A small adhesive bandage may be put over the injection site. It usually can be taken off in an hour or less. Injections can be given by your healthcare provider. In some cases, family members give them. Sometimes, people give them to themselves. Talk with your healthcare provider about what would be best for you. If someone other than your healthcare provider will be giving the shots, the person will need to be trained to give them correctly. HOME CARE INSTRUCTIONS   You can remove the adhesive bandage within an hour of getting a  shot.  You should be able to go about your normal activities right away.  Avoid drinking large amounts of alcohol while taking vitamin B12 shots. Alcohol can interfere with the body's use of the vitamin. SEEK MEDICAL CARE IF:   Pain, redness, swelling or tenderness at the injection site does not get better or gets worse.  Headache, dizziness or weakness does not go away.  You develop a fever of more than 100.5 F (38.1 C). SEEK IMMEDIATE MEDICAL CARE IF:   You have chest pain.  You develop shortness of breath.  You have muscle weakness that gets worse.  You develop numbness, weakness or tingling on one side or one area of the body.  You have symptoms of an allergic reaction, such as:  Hives.  Difficulty breathing.  Swelling of the lips, face, tongue or throat.  You develop a fever of more than 102.0 F (38.9 C). MAKE SURE YOU:   Understand these instructions.  Will watch your condition.  Will get help right away if you are not doing well or get worse. Document   Released: 07/14/2008 Document Revised: 07/10/2011 Document Reviewed: 07/14/2008 [facility] Patient Information 2014 [place], [place]. Fulvestrant injection What is this medicine? FULVESTRANT (ful VES trant) blocks the effects of estrogen. It is used to treat breast cancer in women past the age of menopause. This medicine may be used for other purposes; ask your health care provider or pharmacist if you have questions. COMMON BRAND NAME(S): FASLODEX What should I tell my health care provider before I take this medicine? They need to know if you have any of these conditions: -bleeding problems -liver disease -low levels of platelets in the blood -an unusual or allergic reaction to fulvestrant, other medicines, foods, dyes, or preservatives -pregnant or trying to get pregnant -breast-feeding How should I use this medicine? This medicine is for injection into a muscle. It is usually given by a health care  professional in a hospital or clinic setting. Talk to your pediatrician regarding the use of this medicine in children. Special care may be needed. Overdosage: If you think you have taken too much of this medicine contact a poison control center or emergency room at once. NOTE: This medicine is only for you. Do not share this medicine with others. What if I miss a dose? It is important not to miss your dose. Call your doctor or health care professional if you are unable to keep an appointment. What may interact with this medicine? -medicines that treat or prevent blood clots like warfarin, enoxaparin, and dalteparin This list may not describe all possible interactions. Give your health care provider a list of all the medicines, herbs, non-prescription drugs, or dietary supplements you use. Also tell them if you smoke, drink alcohol, or use illegal drugs. Some items may interact with your medicine. What should I watch for while using this medicine? Your condition will be monitored carefully while you are receiving this medicine. You will need important blood work done while you are taking this medicine. Do not become pregnant while taking this medicine. Women should inform their doctor if they wish to become pregnant or think they might be pregnant. There is a potential for serious side effects to an unborn child. Talk to your health care professional or pharmacist for more information. What side effects may I notice from receiving this medicine? Side effects that you should report to your doctor or health care professional as soon as possible: -allergic reactions like skin rash, itching or hives, swelling of the face, lips, or tongue -feeling faint or lightheaded, falls -fever or flu-like symptoms -sore throat -vaginal bleeding Side effects that usually do not require medical attention (report to your doctor or health care professional if they continue or are bothersome): -aches,  pains -constipation or diarrhea -headache -hot flashes -nausea, vomiting -pain at site where injected -stomach pain This list may not describe all possible side effects. Call your doctor for medical advice about side effects. You may report side effects to FDA at 1-800-FDA-1088. Where should I keep my medicine? This drug is given in a hospital or clinic and will not be stored at home. NOTE: This sheet is a summary. It may not cover all possible information. If you have questions about this medicine, talk to your doctor, pharmacist, or health care provider.  2014, [employer]. (2007-08-26 15:39:24)

## 2013-11-25 NOTE — Progress Notes (Signed)
[facility]  [phone] [phone]  ID: Kerri Vasquez OB: 10-18-1955 MR#: [id number]#:[id number] Patient Care Team: No Pcp Per Patient as PCP - General (General Practice)  DIAGNOSIS: 1. Metastatic breast cancer, solitary liver metastasis.  2. Gastric bypass for subsequent B12 and iron deficiency.  3. Osteoarthritis with chronic pain.  4. 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  INTERVAL HISTORY: [name] is back today with her daughter for follow-up. Her left breast has healed nicely, it is clean and dry, no redness or swelling. She has a follow-up appointment with her surgeon this week. Her last PET scan was in May. Her CA 27-29 was normal in April at 20.  She still has issues with the arthritis. She does get B12 and and Faslodex monthly. She denies n/v, fever, chills, cough rash, headache, dizziness, SOB, chest pain, palpitations, abdominal pain, constipation, diarrhea, blood in urine or stool. She denies any swelling, tenderness, numbness or tingling in her extremities. She states that she is tired all the time but that isn't anything new. Her appetite is good.   CURRENT TREATMENT: 1. Faslodex 500 mg IM monthly.  2. Vitamin B12 1 mg IM monthly.  3. IV iron as indicated.  REVIEW OF SYSTEMS: All other 10 point review of systems is negative except for those issues mentioned above.   PAST MEDICAL HISTORY: Past Medical History  Diagnosis Date  . Depression   . Hypertension   . Migraine   . Hypothyroidism   . Incisional hernia   . Spinal stenosis   . Scoliosis   . Sciatica   . Intestinal obstruction   . GERD (gastroesophageal reflux disease)   . Septic arthritis 01/28/10    and spinal stenosis , moderate scoliosis   . Osteoarthritis of multiple joints   . Anemia   . Pernicious anemia 05/30/2011    Iron transfusion and VB12 on 06/06/11  . Trouble swallowing   . Leg swelling     right   . Abdominal pain   . Constipation   .  Nausea & vomiting   . Sleep apnea     hx of off machine x 4 years   . Diabetes mellitus     type II, controlled by diet 05/25/11   . 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 Renal insufficiency     hx renal failure 2011  . Fibromyalgia    PAST SURGICAL HISTORY: Past Surgical History  Procedure Laterality Date  . Tonsillectomy  1969  . Cystectomy  2009    left breast  . Bariatric surgery  2006  . Other surgical history      surgery for intestinal blockage 2011   . Multiple extractions with alveoloplasty  06/08/2011    Procedure: MULTIPLE EXTRACION WITH ALVEOLOPLASTY;  Surgeon: Lenn Cal, DDS;  Location: [facility];  Service: Oral Surgery;  Laterality: N/A;  Extraction of tooth #'s 3,4,7 with alveoloplasty and Gross Debridement of Remaining Teeth  . Gastric bypass  2006  . Intestinal blockage  2011    surgery for this   . Breast surgery  2008     bilateral mastectomy  . Breast reconstruction  2008, 2009  . Cholecystectomy  06/29/85  . Radio frequency ablation to liver  sept 27, 2013  . Thyroid lobectomy  02/16/2012    Procedure: THYROID LOBECTOMY;  Surgeon: Earnstine Regal, MD;  Location: [facility];  Service: General;  Laterality: Right;  FAMILY HISTORY Family History  Problem Relation Age of Onset  . Cancer Mother     breast  . Hypertension Mother   . Anesthesia problems Daughter    GYNECOLOGIC HISTORY:  Patient's last menstrual period was 08/30/2006.   SOCIAL HISTORY:  History   Social History  . Marital Status: Divorced    Spouse Name: N/A    Number of Children: 1  . Years of Education: N/A   Occupational History  . Not on file.   Social History Main Topics  . Smoking status: Never Smoker   . Smokeless tobacco: Never Used     Comment: never used tobacco  . Alcohol Use: No  . Drug Use: No  . Sexual Activity: Not on file   Other Topics Concern  . Not on file   Social History Narrative   Caffeine use: 3 cups coffee/tea daily    Regular exercise: no         ADVANCED DIRECTIVES: <no information>  HEALTH MAINTENANCE: History  Substance Use Topics  . Smoking status: Never Smoker   . Smokeless tobacco: Never Used     Comment: never used tobacco  . Alcohol Use: No   Colonoscopy: PAP: Bone density: Lipid panel:  Allergies  Allergen Reactions  . Influenza Vac Split [Flu Virus Vaccine] Other (See Comments)    Joint stiffness and renal failure  . Zostavax [Zoster Vaccine Live ([employer])] Other (See Comments)    Joint stiffness, renal failure  . Adhesive [Tape] Itching and Rash   Current Outpatient Prescriptions  Medication Sig Dispense Refill  . buPROPion (WELLBUTRIN SR) 150 MG 12 hr tablet Take 150 mg by mouth 2 (two) times daily.      . carvedilol (COREG) 12.5 MG tablet Take 12.5 mg by mouth 2 (two) times daily.      . diazepam (VALIUM) 5 MG tablet Take 5-10 mg by mouth every 8 (eight) hours as needed.       Marland Kitchen FLUoxetine (PROZAC) 40 MG capsule Take 40 mg by mouth daily before breakfast.       . fulvestrant (FASLODEX) 250 MG/5ML injection Inject 250 mg into the muscle every 30 (thirty) days. One injection each buttock over 1-2 minutes. Warm prior to use.      . furosemide (LASIX) 20 MG tablet TAKE 1 TABLET BY MOUTH EVERY DAY  90 tablet  0  . levothyroxine (SYNTHROID, LEVOTHROID) 300 MCG tablet Take 1 tablet (300 mcg total) by mouth daily before breakfast.  30 tablet  6  . methocarbamol (ROBAXIN) 750 MG tablet Take 750 mg by mouth 4 (four) times daily.      . OxyCODONE (OXYCONTIN) 10 mg T12A 12 hr tablet Take 10 mg by mouth every 8 (eight) hours.      Marland Kitchen oxyCODONE-acetaminophen (PERCOCET) 10-325 MG per tablet Take 2 tablets by mouth 4 (four) times daily.      . pregabalin (LYRICA) 200 MG capsule Take 200 mg by mouth 3 (three) times daily.      . promethazine (PHENERGAN) 25 MG tablet Take 25 mg by mouth every 8 (eight) hours as needed for nausea or vomiting.      . sulfamethoxazole-trimethoprim (BACTRIM DS)  800-160 MG per tablet 1 tablet 2 (two) times daily. Take one tablet bid times 5 weeks.      . temazepam (RESTORIL) 15 MG capsule Take 1 capsule (15 mg total) by mouth at bedtime as needed for sleep.  30 capsule  3  . tiZANidine (ZANAFLEX) 4 MG tablet  Take 4 mg by mouth every 6 (six) hours as needed for muscle spasms.       Current Facility-Administered Medications  Medication Dose Route Frequency Provider Last Rate Last Dose  . HYDROmorphone (DILAUDID) injection 2 mg  2 mg Subcutaneous Once Eliezer Bottom, NP       Facility-Administered Medications Ordered in Other Visits  Medication Dose Route Frequency Provider Last Rate Last Dose  . cyanocobalamin ((VITAMIN B-12)) injection 1,000 mcg  1,000 mcg Intramuscular Once Volanda Napoleon, MD      . fulvestrant (FASLODEX) injection 500 mg  500 mg Intramuscular Once Volanda Napoleon, MD       OBJECTIVE: Filed Vitals:   11/25/13 1021  BP: 114/76  Pulse: 57  Temp: 96.8 F (36 C)  Resp: 20   Body mass index is 40.24 kg/(m^2). ECOG FS:0 - Asymptomatic Ocular: Sclerae unicteric, pupils equal, round and reactive to light Ear-nose-throat: Oropharynx clear, dentition fair Lymphatic: No cervical or supraclavicular adenopathy Lungs no rales or rhonchi, good excursion bilaterally Heart regular rate and rhythm, no murmur appreciated Abd soft, nontender, positive bowel sounds MSK no focal spinal tenderness, no joint edema Neuro: non-focal, well-oriented, appropriate affect Breasts: wound on left breast has closed, no drainage, swelling or redness to indicate infection. No axillary lymphedema.  LAB RESULTS: CMP     Component Value Date/Time   NA 144 11/25/2013 0947   NA 138 09/15/2013 1208   K 3.9 11/25/2013 0947   K 4.6 09/15/2013 1208   CL 102 11/25/2013 0947   CL 107 09/15/2013 1208   CO2 29 11/25/2013 0947   CO2 24 09/15/2013 1208   GLUCOSE 94 11/25/2013 0947   GLUCOSE 88 09/15/2013 1208   BUN 31* 11/25/2013 0947   BUN 23 09/15/2013 1208    CREATININE 1.3* 11/25/2013 0947   CREATININE 1.06 09/15/2013 1208   CALCIUM 9.0 11/25/2013 0947   CALCIUM 8.9 09/15/2013 1208   PROT 7.1 11/25/2013 0947   PROT 6.2 09/15/2013 1208   ALBUMIN 3.5 09/15/2013 1208   AST 18 11/25/2013 0947   AST 19 09/15/2013 1208   ALT 10 11/25/2013 0947   ALT 17 09/15/2013 1208   ALKPHOS 68 11/25/2013 0947   ALKPHOS 64 09/15/2013 1208   BILITOT 0.40 11/25/2013 0947   BILITOT 0.3 09/15/2013 1208   GFRNONAA 80* 07/26/2013 1630   GFRNONAA 64* 03/07/2011 0000   GFRAA >90 07/26/2013 1630   GFRAA 74* 03/07/2011 0000   I No results found for this basename: SPEP, UPEP,  kappa and lambda light chains   Lab Results  Component Value Date   WBC 5.1 11/25/2013   NEUTROABS 2.6 11/25/2013   HGB 12.3 11/25/2013   HCT 37.2 11/25/2013   MCV 98 11/25/2013   PLT 150 11/25/2013   Lab Results  Component Value Date   LABCA2 24 09/15/2013   No components found with this basename: HYIFO277   No results found for this basename: INR,  in the last 168 hours  STUDIES: No results found.  ASSESSMENT/PLAN: [name] is a pleasant 58 year old white female with metastatic breast cancer. She has a solitary liver lesion from the breast cancer. She is on Faslodex for this. She's had no problems. She did get RFA for this lesion. We are doing a Faslodex to try to minimize recurrence.  We will go ahead and give her Faslodex and B12 today. We will also give her a one time dose of Dilaudid for her joint pain today.   Her CBC  and BMP were negative. We will wait and see what her other labs show.  We will see her back on one month for labs and a follow-up.  All questions were answered and she is in agreement with the plan. She knows to call here with any questions or concerns and to go to the ED in the event of an emergency. We can certainly see her sooner if need be.  Eliezer Bottom, NP 11/25/2013 11:02 AM

## 2013-12-18 DIAGNOSIS — G609 Hereditary and idiopathic neuropathy, unspecified: Secondary | ICD-10-CM | POA: Diagnosis not present

## 2013-12-18 DIAGNOSIS — M48062 Spinal stenosis, lumbar region with neurogenic claudication: Secondary | ICD-10-CM | POA: Diagnosis not present

## 2013-12-18 DIAGNOSIS — M47817 Spondylosis without myelopathy or radiculopathy, lumbosacral region: Secondary | ICD-10-CM | POA: Diagnosis not present

## 2013-12-18 DIAGNOSIS — M25539 Pain in unspecified wrist: Secondary | ICD-10-CM | POA: Diagnosis not present

## 2013-12-23 ENCOUNTER — Ambulatory Visit: Payer: Self-pay

## 2013-12-23 ENCOUNTER — Ambulatory Visit: Payer: Self-pay | Admitting: Hematology & Oncology

## 2013-12-23 ENCOUNTER — Other Ambulatory Visit: Payer: Self-pay | Admitting: Lab

## 2013-12-29 ENCOUNTER — Other Ambulatory Visit: Payer: Self-pay | Admitting: Lab

## 2013-12-29 ENCOUNTER — Ambulatory Visit: Payer: Self-pay | Admitting: Hematology & Oncology

## 2013-12-29 ENCOUNTER — Ambulatory Visit: Payer: Self-pay

## 2014-01-01 ENCOUNTER — Telehealth: Payer: Self-pay | Admitting: Hematology & Oncology

## 2014-01-01 NOTE — Telephone Encounter (Signed)
Pt left message to reschedule. Per MD (6 weeks) I left message with 10-12 appointment

## 2014-01-05 ENCOUNTER — Emergency Department (HOSPITAL_COMMUNITY): Payer: Medicare Other

## 2014-01-05 ENCOUNTER — Emergency Department (HOSPITAL_COMMUNITY)
Admission: EM | Admit: 2014-01-05 | Discharge: 2014-01-05 | Disposition: A | Discharge status: Home / Self Care | Payer: Medicare Other | Attending: Emergency Medicine | Admitting: Emergency Medicine

## 2014-01-05 DIAGNOSIS — E039 Hypothyroidism, unspecified: Secondary | ICD-10-CM | POA: Diagnosis not present

## 2014-01-05 DIAGNOSIS — S52202A Unspecified fracture of shaft of left ulna, initial encounter for closed fracture: Secondary | ICD-10-CM

## 2014-01-05 DIAGNOSIS — Z79899 Other long term (current) drug therapy: Secondary | ICD-10-CM | POA: Diagnosis not present

## 2014-01-05 DIAGNOSIS — F329 Major depressive disorder, single episode, unspecified: Secondary | ICD-10-CM | POA: Diagnosis not present

## 2014-01-05 DIAGNOSIS — G43909 Migraine, unspecified, not intractable, without status migrainosus: Secondary | ICD-10-CM | POA: Insufficient documentation

## 2014-01-05 DIAGNOSIS — Y929 Unspecified place or not applicable: Secondary | ICD-10-CM | POA: Diagnosis not present

## 2014-01-05 DIAGNOSIS — S46909A Unspecified injury of unspecified muscle, fascia and tendon at shoulder and upper arm level, unspecified arm, initial encounter: Secondary | ICD-10-CM | POA: Diagnosis not present

## 2014-01-05 DIAGNOSIS — F3289 Other specified depressive episodes: Secondary | ICD-10-CM | POA: Insufficient documentation

## 2014-01-05 DIAGNOSIS — Z8589 Personal history of malignant neoplasm of other organs and systems: Secondary | ICD-10-CM | POA: Diagnosis not present

## 2014-01-05 DIAGNOSIS — W010XXA Fall on same level from slipping, tripping and stumbling without subsequent striking against object, initial encounter: Secondary | ICD-10-CM | POA: Diagnosis not present

## 2014-01-05 DIAGNOSIS — Y939 Activity, unspecified: Secondary | ICD-10-CM | POA: Insufficient documentation

## 2014-01-05 DIAGNOSIS — E119 Type 2 diabetes mellitus without complications: Secondary | ICD-10-CM | POA: Insufficient documentation

## 2014-01-05 DIAGNOSIS — S52599A Other fractures of lower end of unspecified radius, initial encounter for closed fracture: Secondary | ICD-10-CM | POA: Diagnosis not present

## 2014-01-05 DIAGNOSIS — S52509A Unspecified fracture of the lower end of unspecified radius, initial encounter for closed fracture: Secondary | ICD-10-CM | POA: Diagnosis not present

## 2014-01-05 DIAGNOSIS — S4980XA Other specified injuries of shoulder and upper arm, unspecified arm, initial encounter: Secondary | ICD-10-CM | POA: Insufficient documentation

## 2014-01-05 DIAGNOSIS — K219 Gastro-esophageal reflux disease without esophagitis: Secondary | ICD-10-CM | POA: Diagnosis not present

## 2014-01-05 DIAGNOSIS — I10 Essential (primary) hypertension: Secondary | ICD-10-CM | POA: Insufficient documentation

## 2014-01-05 DIAGNOSIS — S5292XA Unspecified fracture of left forearm, initial encounter for closed fracture: Secondary | ICD-10-CM

## 2014-01-05 MED ORDER — OXYCODONE-ACETAMINOPHEN 5-325 MG PO TABS
2.0000 | ORAL_TABLET | Freq: Once | ORAL | Status: AC
  Administered 2014-01-05: 2 via ORAL
  Filled 2014-01-05: qty 2

## 2014-01-05 MED ORDER — OXYCODONE-ACETAMINOPHEN 10-325 MG PO TABS: 1.0000 | ORAL_TABLET | Freq: Four times a day (QID) | ORAL | Status: DC | PRN

## 2014-01-05 MED ORDER — IBUPROFEN 200 MG PO TABS
600.0000 mg | ORAL_TABLET | Freq: Once | ORAL | Status: AC
  Administered 2014-01-05: 600 mg via ORAL
  Filled 2014-01-05: qty 3

## 2014-01-05 NOTE — Discharge Instructions (Signed)
Forearm Fracture Your caregiver has diagnosed you as having a broken bone (fracture) of the forearm. This is the part of your arm between the elbow and your wrist. Your forearm is made up of two bones. These are the radius and ulna. A fracture is a break in one or both bones. A cast or splint is used to protect and keep your injured bone from moving. The cast or splint will be on generally for about 5 to 6 weeks, with individual variations. HOME CARE INSTRUCTIONS   Keep the injured part elevated while sitting or lying down. Keeping the injury above the level of your heart (the center of the chest). This will decrease swelling and pain.  Apply ice to the injury for 15-20 minutes, 03-04 times per day while awake, for 2 days. Put the ice in a plastic bag and place a thin towel between the bag of ice and your cast or splint.  If you have a plaster or fiberglass cast:  Do not try to scratch the skin under the cast using sharp or pointed objects.  Check the skin around the cast every day. You may put lotion on any red or sore areas.  Keep your cast dry and clean.  If you have a plaster splint:  Wear the splint as directed.  You may loosen the elastic around the splint if your fingers become numb, tingle, or turn cold or blue.  Do not put pressure on any part of your cast or splint. It may break. Rest your cast only on a pillow the first 24 hours until it is fully hardened.  Your cast or splint can be protected during bathing with a plastic bag. Do not lower the cast or splint into water.  Only take over-the-counter or prescription medicines for pain, discomfort, or fever as directed by your caregiver. SEEK IMMEDIATE MEDICAL CARE IF:   Your cast gets damaged or breaks.  You have more severe pain or swelling than you did before the cast.  Your skin or nails below the injury turn blue or gray, or feel cold or numb.  There is a bad smell or new stains and/or pus like (purulent) drainage  coming from under the cast. MAKE SURE YOU:  Splint Care Splints protect and rest injuries. Splints can be made of plaster, fiberglass, or metal. They are used to treat broken bones, sprains, tendonitis, and other injuries. HOME CARE  Keep the injured area raised (elevated) while sitting or lying down. Keep the injured body part just above the level of the heart. This will decrease puffiness (swelling) and pain.  If an elastic bandage was used to hold the splint, it can be loosened. Only loosen it to make room for puffiness and to ease pain.  Keep the splint clean and dry.  Do not scratch the skin under the splint with sharp or pointed objects.  Follow up with your doctor as told. GET HELP RIGHT AWAY IF:   There is more pain or pressure around the injury.  There is numbness, tingling, or pain in the toes or fingers past the injury.  The fingers or toes become cold or blue.  The splint becomes too soft or breaks before the injury is healed. MAKE SURE YOU:   Understand these instructions.  Will watch this condition.  Will get help right away if you are not doing well or get worse. Document Released: 01/25/2008 Document Revised: 07/10/2011 Document Reviewed: 01/25/2008 [facility] Patient Information 2015 [place], [place]. This information is  not intended to replace advice given to you by your health care provider. Make sure you discuss any questions you have with your health care provider.   Understand these instructions.  Will watch your condition.  Will get help right away if you are not doing well or get worse. Document Released: 04/14/2000 Document Revised: 07/10/2011 Document Reviewed: 12/05/2007 [facility] Patient Information 2015 [place], [place]. This information is not intended to replace advice given to you by your health care provider. Make sure you discuss any questions you have with your health care provider.

## 2014-01-05 NOTE — ED Provider Notes (Signed)
CSN: [id number]     Arrival date & time 01/05/14  0747 History   First MD Initiated Contact with Patient 01/05/14 [phone]     Chief Complaint  Patient presents with  . Left arm problem      (Consider location/radiation/quality/duration/timing/severity/associated sxs/prior Treatment) HPI  58 year old female with left wrist pain. Onset approximately 3 weeks ago. Patient reports fall onto outstretched hand. Persistent pain since then. Repeat fall 2 days ago when she tripped over her cat and having increased pain and swelling in same area. Closed injury. No numbness or tingling. Denies any significant pain anywhere else.  Past Medical History  Diagnosis Date  . Depression   . Hypertension   . Migraine   . Hypothyroidism   . Incisional hernia   . Spinal stenosis   . Scoliosis   . Sciatica   . Intestinal obstruction   . GERD (gastroesophageal reflux disease)   . Septic arthritis 01/28/10    and spinal stenosis , moderate scoliosis   . Osteoarthritis of multiple joints   . Anemia   . Pernicious anemia 05/30/2011    Iron transfusion and VB12 on 06/06/11  . Trouble swallowing   . Leg swelling     right   . Abdominal pain   . Constipation   . Nausea & vomiting   . Sleep apnea     hx of off machine x 4 years   . Diabetes mellitus     type II, controlled by diet 05/25/11   . 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 Renal insufficiency     hx renal failure 2011  . Fibromyalgia    Past Surgical History  Procedure Laterality Date  . Tonsillectomy  1969  . Cystectomy  2009    left breast  . Bariatric surgery  2006  . Other surgical history      surgery for intestinal blockage 2011   . Multiple extractions with alveoloplasty  06/08/2011    Procedure: MULTIPLE EXTRACION WITH ALVEOLOPLASTY;  Surgeon: Lenn Cal, DDS;  Location: [facility];  Service: Oral Surgery;  Laterality: N/A;  Extraction of tooth #'s 3,4,7 with alveoloplasty and Gross Debridement of Remaining  Teeth  . Gastric bypass  2006  . Intestinal blockage  2011    surgery for this   . Breast surgery  2008     bilateral mastectomy  . Breast reconstruction  2008, 2009  . Cholecystectomy  06/29/85  . Radio frequency ablation to liver  sept 27, 2013  . Thyroid lobectomy  02/16/2012    Procedure: THYROID LOBECTOMY;  Surgeon: Earnstine Regal, MD;  Location: [facility];  Service: General;  Laterality: Right;   Family History  Problem Relation Age of Onset  . Cancer Mother     breast  . Hypertension Mother   . Anesthesia problems Daughter    History  Substance Use Topics  . Smoking status: Never Smoker   . Smokeless tobacco: Never Used     Comment: never used tobacco  . Alcohol Use: No   OB History   Grav Para Term Preterm Abortions TAB SAB Ect Mult Living                 Review of Systems  All systems reviewed and negative, other than as noted in HPI.   Allergies  Influenza vac split; Zostavax; and Adhesive  Home Medications   Prior to Admission medications   Medication Sig Start Date End  Date Taking? Authorizing Provider  acetaminophen (TYLENOL) 500 MG tablet Take 1,000 mg by mouth every 6 (six) hours as needed for mild pain or moderate pain.   Yes Historical Provider, MD  buPROPion (WELLBUTRIN SR) 150 MG 12 hr tablet Take 150 mg by mouth 2 (two) times daily.   Yes Historical Provider, MD  carvedilol (COREG) 12.5 MG tablet Take 12.5 mg by mouth 2 (two) times daily. 01/23/12  Yes Volanda Napoleon, MD  diazepam (VALIUM) 5 MG tablet Take 5-10 mg by mouth every 8 (eight) hours as needed for anxiety.  08/19/13  Yes Historical Provider, MD  FLUoxetine (PROZAC) 40 MG capsule Take 40 mg by mouth daily before breakfast.    Yes Historical Provider, MD  fulvestrant (FASLODEX) 250 MG/5ML injection Inject 250 mg into the muscle every 30 (thirty) days. One injection each buttock over 1-2 minutes. Warm prior to use.   Yes Historical Provider, MD  furosemide (LASIX) 20 MG tablet Take 20 mg by mouth  daily.   Yes Historical Provider, MD  levothyroxine (SYNTHROID, LEVOTHROID) 300 MCG tablet Take 300 mcg by mouth daily before breakfast.   Yes Historical Provider, MD  methocarbamol (ROBAXIN) 750 MG tablet Take 750 mg by mouth 4 (four) times daily.   Yes Historical Provider, MD  OxyCODONE (OXYCONTIN) 10 mg T12A 12 hr tablet Take 10 mg by mouth every 8 (eight) hours.   Yes Historical Provider, MD  oxyCODONE-acetaminophen (PERCOCET) 10-325 MG per tablet Take 2 tablets by mouth 4 (four) times daily.   Yes Historical Provider, MD  pregabalin (LYRICA) 200 MG capsule Take 200 mg by mouth 3 (three) times daily.   Yes Historical Provider, MD  promethazine (PHENERGAN) 25 MG tablet Take 1 tablet (25 mg total) by mouth every 8 (eight) hours as needed for nausea or vomiting. 11/25/13  Yes Volanda Napoleon, MD  temazepam (RESTORIL) 15 MG capsule Take 1 capsule (15 mg total) by mouth at bedtime as needed for sleep. 11/25/13  Yes Volanda Napoleon, MD  tiZANidine (ZANAFLEX) 4 MG tablet Take 4 mg by mouth every 6 (six) hours as needed for muscle spasms.   Yes Historical Provider, MD  oxyCODONE-acetaminophen (PERCOCET) 10-325 MG per tablet Take 1 tablet by mouth every 6 (six) hours as needed for pain. 01/05/14   Virgel Manifold, MD   BP 134/81  Pulse 80  Temp(Src) 98 F (36.7 C) (Oral)  Resp 16  SpO2 100%  LMP 08/30/2006 Physical Exam  Nursing note and vitals reviewed. Constitutional: She appears well-developed and well-nourished. No distress.  HENT:  Head: Normocephalic and atraumatic.  Eyes: Conjunctivae are normal. Right eye exhibits no discharge. Left eye exhibits no discharge.  Neck: Neck supple.  Cardiovascular: Normal rate, regular rhythm and normal heart sounds.  Exam reveals no gallop and no friction rub.   No murmur heard. Pulmonary/Chest: Effort normal and breath sounds normal. No respiratory distress.  Abdominal: Soft. She exhibits no distension. There is no tenderness.  Musculoskeletal: She exhibits  edema and tenderness.  Tenderness to palpation distal left wrist with some mild swelling. Closed injury. Neurovascularly intact distally.  Neurological: She is alert.  Skin: Skin is warm and dry.  Psychiatric: She has a normal mood and affect. Her behavior is normal. Thought content normal.    ED Course  Procedures (including critical care time) Labs Review Labs Reviewed - No data to display  Imaging Review Dg Wrist Complete Left  01/05/2014   CLINICAL DATA:  [place] 3 weeks ago. Pain and bruising.  Fell 2 days ago now with limited range of motion.  EXAM: LEFT WRIST - COMPLETE 3+ VIEW  COMPARISON:  None.  FINDINGS: Fracture at of the distal left radius with the distal radial articular surface in neutral to slightly dorsal angulation. Intra-articular extension not excluded.  Ulnar styloid fracture with slight displacement.  Cystic changes of the scaphoid without scaphoid fracture detected by plain film exam.  IMPRESSION: Distal left radius and ulnar fracture as noted above.   Electronically Signed   By: Chauncey Cruel M.D.   On: 01/05/2014 09:04     EKG Interpretation None      MDM   Final diagnoses:  Forearm fractures, both bones, closed, left, initial encounter    58 year old female with left wrist pain after mechanical fall. Closed injury. There is intact. Imaging significant for both bone forearm fracture. No tenderness in anatomic snuffbox. Splint. As needed pain medication. Orthopedic followup.    Virgel Manifold, MD 01/05/14 [phone]

## 2014-01-05 NOTE — ED Notes (Signed)
Pt c/o left wrist/foream pain from injury 3 weeks ago.

## 2014-01-09 DIAGNOSIS — S52539A Colles' fracture of unspecified radius, initial encounter for closed fracture: Secondary | ICD-10-CM | POA: Diagnosis not present

## 2014-01-15 DIAGNOSIS — M25539 Pain in unspecified wrist: Secondary | ICD-10-CM | POA: Diagnosis not present

## 2014-01-15 DIAGNOSIS — G609 Hereditary and idiopathic neuropathy, unspecified: Secondary | ICD-10-CM | POA: Diagnosis not present

## 2014-01-15 DIAGNOSIS — M48062 Spinal stenosis, lumbar region with neurogenic claudication: Secondary | ICD-10-CM | POA: Diagnosis not present

## 2014-01-15 DIAGNOSIS — M47817 Spondylosis without myelopathy or radiculopathy, lumbosacral region: Secondary | ICD-10-CM | POA: Diagnosis not present

## 2014-02-04 ENCOUNTER — Other Ambulatory Visit: Payer: Self-pay | Admitting: Hematology & Oncology

## 2014-02-09 ENCOUNTER — Other Ambulatory Visit: Payer: Self-pay | Admitting: Lab

## 2014-02-09 ENCOUNTER — Ambulatory Visit: Payer: Self-pay | Admitting: Family

## 2014-02-09 ENCOUNTER — Ambulatory Visit: Payer: Self-pay

## 2014-02-09 ENCOUNTER — Telehealth: Payer: Self-pay | Admitting: Hematology & Oncology

## 2014-02-09 NOTE — Telephone Encounter (Signed)
Patient's daughter called and cx 02/09/14 apt.  Nurse was notified of cx apt.  Apt was resch for 02/16/14

## 2014-02-10 DIAGNOSIS — S52532D Colles' fracture of left radius, subsequent encounter for closed fracture with routine healing: Secondary | ICD-10-CM | POA: Diagnosis not present

## 2014-02-12 DIAGNOSIS — N644 Mastodynia: Secondary | ICD-10-CM | POA: Diagnosis not present

## 2014-02-12 DIAGNOSIS — M25532 Pain in left wrist: Secondary | ICD-10-CM | POA: Diagnosis not present

## 2014-02-12 DIAGNOSIS — G603 Idiopathic progressive neuropathy: Secondary | ICD-10-CM | POA: Diagnosis not present

## 2014-02-12 DIAGNOSIS — M4726 Other spondylosis with radiculopathy, lumbar region: Secondary | ICD-10-CM | POA: Diagnosis not present

## 2014-02-16 ENCOUNTER — Ambulatory Visit (HOSPITAL_BASED_OUTPATIENT_CLINIC_OR_DEPARTMENT_OTHER): Payer: Medicare Other | Admitting: Family

## 2014-02-16 ENCOUNTER — Ambulatory Visit (HOSPITAL_BASED_OUTPATIENT_CLINIC_OR_DEPARTMENT_OTHER): Payer: Medicare Other

## 2014-02-16 ENCOUNTER — Other Ambulatory Visit (HOSPITAL_BASED_OUTPATIENT_CLINIC_OR_DEPARTMENT_OTHER): Payer: Medicare Other | Admitting: Lab

## 2014-02-16 VITALS — BP 188/55 | HR 113 | Temp 97.7°F | Resp 18 | Ht 66.0 in | Wt 251.0 lb

## 2014-02-16 DIAGNOSIS — Z5111 Encounter for antineoplastic chemotherapy: Secondary | ICD-10-CM | POA: Diagnosis not present

## 2014-02-16 DIAGNOSIS — C50919 Malignant neoplasm of unspecified site of unspecified female breast: Secondary | ICD-10-CM

## 2014-02-16 DIAGNOSIS — E538 Deficiency of other specified B group vitamins: Secondary | ICD-10-CM

## 2014-02-16 DIAGNOSIS — C787 Secondary malignant neoplasm of liver and intrahepatic bile duct: Secondary | ICD-10-CM

## 2014-02-16 DIAGNOSIS — D510 Vitamin B12 deficiency anemia due to intrinsic factor deficiency: Secondary | ICD-10-CM

## 2014-02-16 LAB — IRON AND TIBC CHCC
%SAT: 40 % (ref 21–57)
Iron: 76 ug/dL (ref 41–142)
TIBC: 190 ug/dL — AB (ref 236–444)
UIBC: 114 ug/dL — ABNORMAL LOW (ref 120–384)

## 2014-02-16 LAB — CBC WITH DIFFERENTIAL (CANCER CENTER ONLY)
BASO#: 0 10*3/uL (ref 0.0–0.2)
BASO%: 0.4 % (ref 0.0–2.0)
EOS%: 2.8 % (ref 0.0–7.0)
Eosinophils Absolute: 0.2 10*3/uL (ref 0.0–0.5)
HCT: 33.5 % — ABNORMAL LOW (ref 34.8–46.6)
HEMOGLOBIN: 11 g/dL — AB (ref 11.6–15.9)
LYMPH#: 1.9 10*3/uL (ref 0.9–3.3)
LYMPH%: 34.6 % (ref 14.0–48.0)
MCH: 32.2 pg (ref 26.0–34.0)
MCHC: 32.8 g/dL (ref 32.0–36.0)
MCV: 98 fL (ref 81–101)
MONO#: 0.5 10*3/uL (ref 0.1–0.9)
MONO%: 8.8 % (ref 0.0–13.0)
NEUT%: 53.4 % (ref 39.6–80.0)
NEUTROS ABS: 2.9 10*3/uL (ref 1.5–6.5)
Platelets: 163 10*3/uL (ref 145–400)
RBC: 3.42 10*6/uL — AB (ref 3.70–5.32)
RDW: 12.8 % (ref 11.1–15.7)
WBC: 5.3 10*3/uL (ref 3.9–10.0)

## 2014-02-16 LAB — COMPREHENSIVE METABOLIC PANEL
ALBUMIN: 3.8 g/dL (ref 3.5–5.2)
ALT: <8 U/L (ref 0–35)
AST: 12 U/L (ref 0–37)
Alkaline Phosphatase: 78 U/L (ref 39–117)
BUN: 17 mg/dL (ref 6–23)
CALCIUM: 8.6 mg/dL (ref 8.4–10.5)
CHLORIDE: 111 meq/L (ref 96–112)
CO2: 22 mEq/L (ref 19–32)
Creatinine, Ser: 0.96 mg/dL (ref 0.50–1.10)
GLUCOSE: 83 mg/dL (ref 70–99)
POTASSIUM: 4 meq/L (ref 3.5–5.3)
Sodium: 142 mEq/L (ref 135–145)
Total Bilirubin: 0.4 mg/dL (ref 0.2–1.2)
Total Protein: 6.6 g/dL (ref 6.0–8.3)

## 2014-02-16 LAB — FERRITIN CHCC: FERRITIN: 573 ng/mL — AB (ref 9–269)

## 2014-02-16 LAB — CANCER ANTIGEN 27.29: CA 27.29: 26 U/mL (ref 0–39)

## 2014-02-16 MED ORDER — CYANOCOBALAMIN 1000 MCG/ML IJ SOLN
INTRAMUSCULAR | Status: AC
  Filled 2014-02-16: qty 1

## 2014-02-16 MED ORDER — CYANOCOBALAMIN 1000 MCG/ML IJ SOLN
1000.0000 ug | Freq: Once | INTRAMUSCULAR | Status: AC
Start: 2014-02-16 — End: 2014-02-16
  Administered 2014-02-16: 1000 ug via INTRAMUSCULAR

## 2014-02-16 MED ORDER — FULVESTRANT 250 MG/5ML IM SOLN
INTRAMUSCULAR | Status: AC
  Filled 2014-02-16: qty 5

## 2014-02-16 MED ORDER — FULVESTRANT 250 MG/5ML IM SOLN
500.0000 mg | Freq: Once | INTRAMUSCULAR | Status: AC
  Administered 2014-02-16: 500 mg via INTRAMUSCULAR
  Filled 2014-02-16: qty 10

## 2014-02-16 NOTE — Progress Notes (Signed)
. [place]  Telephone:(336) [phone] [phone]) [phone]  ID: Kerri Vasquez OB: 04-16-1956 MR#: [id number] QIH#:[id number] Patient Care Team: No Pcp Per Patient as PCP - General (General Practice)  DIAGNOSIS: 1. Metastatic breast cancer, solitary liver metastasis.  2. Gastric bypass for subsequent B12 and iron deficiency.  3. Osteoarthritis with chronic pain.  4. 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  INTERVAL HISTORY: [name] is here today for a follow-up. She has had some issues since her last visit. She had to have her ruptured left breast implant removed and then the site became infected. She has finished antibiotics and her wound has healed nicely. She also tripped over her new cat and broke her left wrist. She just got her cast off and is wearing a brace. Her right breast implant has shifted to the right and is migrating under her right arm. She has to have this fixed. She also needs a right knee replacement and hernia repair. She is a little tearful today because she is stressed. Her last PET scan was in May. Her CA 27-29 in July was 25.  She still has issues with the arthritis. She does get B12 and and Faslodex monthly. She denies n/v, fever, chills, cough rash, headache, dizziness, SOB, chest pain, palpitations, abdominal pain, constipation, diarrhea, blood in urine or stool. She denies any swelling, tenderness, numbness or tingling in her extremities. She is still tired a lot of the time. Her appetite is good and she is staying hydrated.   CURRENT TREATMENT: 1. Faslodex 500 mg IM monthly.  2. Vitamin B12 1 mg IM monthly.  3. IV iron as indicated.  REVIEW OF SYSTEMS: All other 10 point review of systems is negative except for those issues mentioned above.   PAST MEDICAL HISTORY: Past Medical History  Diagnosis Date  . Depression   . Hypertension   . Migraine   . Hypothyroidism   . Incisional hernia   . Spinal stenosis   . Scoliosis   . Sciatica    . Intestinal obstruction   . GERD (gastroesophageal reflux disease)   . Septic arthritis 01/28/10    and spinal stenosis , moderate scoliosis   . Osteoarthritis of multiple joints   . Anemia   . Pernicious anemia 05/30/2011    Iron transfusion and VB12 on 06/06/11  . Trouble swallowing   . Leg swelling     right   . Abdominal pain   . Constipation   . Nausea & vomiting   . Sleep apnea     hx of off machine x 4 years   . Diabetes mellitus     type II, controlled by diet 05/25/11   . 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 Renal insufficiency     hx renal failure 2011  . Fibromyalgia    PAST SURGICAL HISTORY: Past Surgical History  Procedure Laterality Date  . Tonsillectomy  1969  . Cystectomy  2009    left breast  . Bariatric surgery  2006  . Other surgical history      surgery for intestinal blockage 2011   . Multiple extractions with alveoloplasty  06/08/2011    Procedure: MULTIPLE EXTRACION WITH ALVEOLOPLASTY;  Surgeon: Lenn Cal, DDS;  Location: [facility];  Service: Oral Surgery;  Laterality: N/A;  Extraction of tooth #'s 3,4,7 with alveoloplasty and Gross Debridement of Remaining Teeth  . Gastric bypass  2006  . Intestinal blockage  2011    surgery for this   . Breast surgery  2008     bilateral mastectomy  . Breast reconstruction  2008, 2009  . Cholecystectomy  06/29/85  . Radio frequency ablation to liver  sept 27, 2013  . Thyroid lobectomy  02/16/2012    Procedure: THYROID LOBECTOMY;  Surgeon: Earnstine Regal, MD;  Location: [facility];  Service: General;  Laterality: Right;   FAMILY HISTORY Family History  Problem Relation Age of Onset  . Cancer Mother     breast  . Hypertension Mother   . Anesthesia problems Daughter    GYNECOLOGIC HISTORY:  Patient's last menstrual period was 08/30/2006.   SOCIAL HISTORY:  History   Social History  . Marital Status: Divorced    Spouse Name: N/A    Number of Children: 1  . Years of Education:  N/A   Occupational History  . Not on file.   Social History Main Topics  . Smoking status: Never Smoker   . Smokeless tobacco: Never Used     Comment: never used tobacco  . Alcohol Use: No  . Drug Use: No  . Sexual Activity: Not on file   Other Topics Concern  . Not on file   Social History Narrative   Caffeine use: 3 cups coffee/tea daily   Regular exercise: no         ADVANCED DIRECTIVES: <no information>  HEALTH MAINTENANCE: History  Substance Use Topics  . Smoking status: Never Smoker   . Smokeless tobacco: Never Used     Comment: never used tobacco  . Alcohol Use: No   Colonoscopy: PAP: Bone density: Lipid panel:  Allergies  Allergen Reactions  . Influenza Vac Split [Flu Virus Vaccine] Other (See Comments)    Joint stiffness and renal failure  . Zostavax [Zoster Vaccine Live ([employer])] Other (See Comments)    Joint stiffness, renal failure  . Adhesive [Tape] Itching and Rash   Current Outpatient Prescriptions  Medication Sig Dispense Refill  . acetaminophen (TYLENOL) 500 MG tablet Take 1,000 mg by mouth every 6 (six) hours as needed for mild pain or moderate pain.      Marland Kitchen buPROPion (WELLBUTRIN SR) 150 MG 12 hr tablet Take 150 mg by mouth 2 (two) times daily.      . carvedilol (COREG) 12.5 MG tablet Take 12.5 mg by mouth 2 (two) times daily.      Marland Kitchen FLUoxetine (PROZAC) 40 MG capsule Take 40 mg by mouth daily before breakfast.       . fulvestrant (FASLODEX) 250 MG/5ML injection Inject 250 mg into the muscle every 30 (thirty) days. One injection each buttock over 1-2 minutes. Warm prior to use.      . furosemide (LASIX) 20 MG tablet TAKE 1 TABLET BY MOUTH EVERY DAY  90 tablet  0  . levothyroxine (SYNTHROID, LEVOTHROID) 300 MCG tablet Take 300 mcg by mouth daily before breakfast.      . methocarbamol (ROBAXIN) 750 MG tablet Take 750 mg by mouth 4 (four) times daily.      . OxyCODONE (OXYCONTIN) 10 mg T12A 12 hr tablet Take 10 mg by mouth every 12 (twelve)  hours.       Marland Kitchen oxyCODONE-acetaminophen (PERCOCET) 10-325 MG per tablet Take 1 tablet by mouth every 6 (six) hours as needed for pain.  20 tablet  0  . pregabalin (LYRICA) 200 MG capsule Take 200 mg by mouth 3 (three) times daily.      . promethazine (PHENERGAN) 25  MG tablet Take 1 tablet (25 mg total) by mouth every 8 (eight) hours as needed for nausea or vomiting.  30 tablet  2  . temazepam (RESTORIL) 15 MG capsule Take 1 capsule (15 mg total) by mouth at bedtime as needed for sleep.  30 capsule  3  . tiZANidine (ZANAFLEX) 4 MG tablet Take 4 mg by mouth every 6 (six) hours as needed for muscle spasms.       No current facility-administered medications for this visit.   Facility-Administered Medications Ordered in Other Visits  Medication Dose Route Frequency Provider Last Rate Last Dose  . cyanocobalamin ((VITAMIN B-12)) injection 1,000 mcg  1,000 mcg Intramuscular Once Volanda Napoleon, MD      . fulvestrant (FASLODEX) injection 500 mg  500 mg Intramuscular Once Volanda Napoleon, MD       OBJECTIVE: There were no vitals filed for this visit. There is no weight on file to calculate BMI. ECOG FS:0 - Asymptomatic Ocular: Sclerae unicteric, pupils equal, round and reactive to light Ear-nose-throat: Oropharynx clear, dentition fair Lymphatic: No cervical or supraclavicular adenopathy Lungs no rales or rhonchi, good excursion bilaterally Heart regular rate and rhythm, no murmur appreciated Abd soft, nontender, positive bowel sounds MSK no focal spinal tenderness, no joint edema Neuro: non-focal, well-oriented, appropriate affect Breasts: Wound to left breast has healed nicely, no s/s of infection. No mass, rash or lymphedema.   LAB RESULTS: CMP     Component Value Date/Time   NA 144 11/25/2013 0947   NA 138 09/15/2013 1208   K 3.9 11/25/2013 0947   K 4.6 09/15/2013 1208   CL 102 11/25/2013 0947   CL 107 09/15/2013 1208   CO2 29 11/25/2013 0947   CO2 24 09/15/2013 1208   GLUCOSE 94 11/25/2013  0947   GLUCOSE 88 09/15/2013 1208   BUN 31* 11/25/2013 0947   BUN 23 09/15/2013 1208   CREATININE 1.3* 11/25/2013 0947   CREATININE 1.06 09/15/2013 1208   CALCIUM 9.0 11/25/2013 0947   CALCIUM 8.9 09/15/2013 1208   PROT 7.1 11/25/2013 0947   PROT 6.2 09/15/2013 1208   ALBUMIN 3.5 09/15/2013 1208   AST 18 11/25/2013 0947   AST 19 09/15/2013 1208   ALT 10 11/25/2013 0947   ALT 17 09/15/2013 1208   ALKPHOS 68 11/25/2013 0947   ALKPHOS 64 09/15/2013 1208   BILITOT 0.40 11/25/2013 0947   BILITOT 0.3 09/15/2013 1208   GFRNONAA 80* 07/26/2013 1630   GFRNONAA 64* 03/07/2011 0000   GFRAA >90 07/26/2013 1630   GFRAA 74* 03/07/2011 0000   No results found for this basename: SPEP,  UPEP,   kappa and lambda light chains   Lab Results  Component Value Date   WBC 5.3 02/16/2014   NEUTROABS 2.9 02/16/2014   HGB 11.0* 02/16/2014   HCT 33.5* 02/16/2014   MCV 98 02/16/2014   PLT 163 02/16/2014   Lab Results  Component Value Date   LABCA2 25 11/25/2013   No components found with this basename: [id number]   No results found for this basename: INR,  in the last 168 hours  STUDIES: No results found.  ASSESSMENT/PLAN: [name] is a pleasant 58 year old white female with metastatic breast cancer. She has a solitary liver lesion from the breast cancer. She is on Faslodex for this. She's had no problems. She did get RFA for this lesion. We are doing a Faslodex to try to minimize recurrence.  We will go ahead and give her Faslodex and B12  today. I will speak with Dr. Marin Olp and see if he wants to repeat her PET scan in November.  Her CBC was ok today. We will wait and see what her iron studies show.  We will see her back on one month for labs and a follow-up.  All questions were answered and she is in agreement with the plan. She knows to call here with any questions or concerns and to go to the ED in the event of an emergency. We can certainly see her sooner if need be.   Eliezer Bottom, NP 02/16/2014  12:11 PM

## 2014-02-16 NOTE — Patient Instructions (Signed)
Cyanocobalamin, Vitamin B12 injection What is this medicine? CYANOCOBALAMIN (sye an oh koe BAL a min) is a man made form of vitamin B12. Vitamin B12 is used in the growth of healthy blood cells, nerve cells, and proteins in the body. It also helps with the metabolism of fats and carbohydrates. This medicine is used to treat people who can not absorb vitamin B12. This medicine may be used for other purposes; ask your health care provider or pharmacist if you have questions. COMMON BRAND NAME(S): Cyomin, LA-12, Nutri-Twelve, Primabalt What should I tell my health care provider before I take this medicine? They need to know if you have any of these conditions: -kidney disease -Leber's disease -megaloblastic anemia -an unusual or allergic reaction to cyanocobalamin, cobalt, other medicines, foods, dyes, or preservatives -pregnant or trying to get pregnant -breast-feeding How should I use this medicine? This medicine is injected into a muscle or deeply under the skin. It is usually given by a health care professional in a clinic or doctor's office. However, your doctor may teach you how to inject yourself. Follow all instructions. Talk to your pediatrician regarding the use of this medicine in children. Special care may be needed. Overdosage: If you think you have taken too much of this medicine contact a poison control center or emergency room at once. NOTE: This medicine is only for you. Do not share this medicine with others. What if I miss a dose? If you are given your dose at a clinic or doctor's office, call to reschedule your appointment. If you give your own injections and you miss a dose, take it as soon as you can. If it is almost time for your next dose, take only that dose. Do not take double or extra doses. What may interact with this medicine? -colchicine -heavy alcohol intake This list may not describe all possible interactions. Give your health care provider a list of all the  medicines, herbs, non-prescription drugs, or dietary supplements you use. Also tell them if you smoke, drink alcohol, or use illegal drugs. Some items may interact with your medicine. What should I watch for while using this medicine? Visit your doctor or health care professional regularly. You may need blood work done while you are taking this medicine. You may need to follow a special diet. Talk to your doctor. Limit your alcohol intake and avoid smoking to get the best benefit. What side effects may I notice from receiving this medicine? Side effects that you should report to your doctor or health care professional as soon as possible: -allergic reactions like skin rash, itching or hives, swelling of the face, lips, or tongue -blue tint to skin -chest tightness, pain -difficulty breathing, wheezing -dizziness -red, swollen painful area on the leg Side effects that usually do not require medical attention (report to your doctor or health care professional if they continue or are bothersome): -diarrhea -headache This list may not describe all possible side effects. Call your doctor for medical advice about side effects. You may report side effects to FDA at 1-800-FDA-1088. Where should I keep my medicine? Keep out of the reach of children. Store at room temperature between 15 and 30 degrees C (59 and 85 degrees F). Protect from light. Throw away any unused medicine after the expiration date. NOTE: This sheet is a summary. It may not cover all possible information. If you have questions about this medicine, talk to your doctor, pharmacist, or health care provider.  2015, Elsevier/Gold Standard. (2007-07-29 22:10:20) Fulvestrant   injection What is this medicine? FULVESTRANT (ful VES trant) blocks the effects of estrogen. It is used to treat breast cancer in women past the age of menopause. This medicine may be used for other purposes; ask your health care provider or pharmacist if you have  questions. COMMON BRAND NAME(S): FASLODEX What should I tell my health care provider before I take this medicine? They need to know if you have any of these conditions: -bleeding problems -liver disease -low levels of platelets in the blood -an unusual or allergic reaction to fulvestrant, other medicines, foods, dyes, or preservatives -pregnant or trying to get pregnant -breast-feeding How should I use this medicine? This medicine is for injection into a muscle. It is usually given by a health care professional in a hospital or clinic setting. Talk to your pediatrician regarding the use of this medicine in children. Special care may be needed. Overdosage: If you think you have taken too much of this medicine contact a poison control center or emergency room at once. NOTE: This medicine is only for you. Do not share this medicine with others. What if I miss a dose? It is important not to miss your dose. Call your doctor or health care professional if you are unable to keep an appointment. What may interact with this medicine? -medicines that treat or prevent blood clots like warfarin, enoxaparin, and dalteparin This list may not describe all possible interactions. Give your health care provider a list of all the medicines, herbs, non-prescription drugs, or dietary supplements you use. Also tell them if you smoke, drink alcohol, or use illegal drugs. Some items may interact with your medicine. What should I watch for while using this medicine? Your condition will be monitored carefully while you are receiving this medicine. You will need important blood work done while you are taking this medicine. Do not become pregnant while taking this medicine. Women should inform their doctor if they wish to become pregnant or think they might be pregnant. There is a potential for serious side effects to an unborn child. Talk to your health care professional or pharmacist for more information. What side  effects may I notice from receiving this medicine? Side effects that you should report to your doctor or health care professional as soon as possible: -allergic reactions like skin rash, itching or hives, swelling of the face, lips, or tongue -feeling faint or lightheaded, falls -fever or flu-like symptoms -sore throat -vaginal bleeding Side effects that usually do not require medical attention (report to your doctor or health care professional if they continue or are bothersome): -aches, pains -constipation or diarrhea -headache -hot flashes -nausea, vomiting -pain at site where injected -stomach pain This list may not describe all possible side effects. Call your doctor for medical advice about side effects. You may report side effects to FDA at 1-800-FDA-1088. Where should I keep my medicine? This drug is given in a hospital or clinic and will not be stored at home. NOTE: This sheet is a summary. It may not cover all possible information. If you have questions about this medicine, talk to your doctor, pharmacist, or health care provider.  2015, Elsevier/Gold Standard. (2007-08-26 15:39:24)  

## 2014-02-23 ENCOUNTER — Other Ambulatory Visit: Payer: Self-pay | Admitting: Family

## 2014-02-23 DIAGNOSIS — C50919 Malignant neoplasm of unspecified site of unspecified female breast: Secondary | ICD-10-CM

## 2014-02-25 ENCOUNTER — Telehealth: Payer: Self-pay | Admitting: Hematology & Oncology

## 2014-02-25 NOTE — Telephone Encounter (Signed)
Called both numbers to give schedule and PET instructions, no answer or voice mail. I mailed schedule with instruction sheet for PET.

## 2014-03-11 ENCOUNTER — Encounter (HOSPITAL_COMMUNITY): Payer: Self-pay

## 2014-03-11 DIAGNOSIS — G603 Idiopathic progressive neuropathy: Secondary | ICD-10-CM | POA: Diagnosis not present

## 2014-03-11 DIAGNOSIS — M4726 Other spondylosis with radiculopathy, lumbar region: Secondary | ICD-10-CM | POA: Diagnosis not present

## 2014-03-11 DIAGNOSIS — M25532 Pain in left wrist: Secondary | ICD-10-CM | POA: Diagnosis not present

## 2014-03-11 DIAGNOSIS — N644 Mastodynia: Secondary | ICD-10-CM | POA: Diagnosis not present

## 2014-03-13 ENCOUNTER — Encounter (HOSPITAL_COMMUNITY): Payer: Medicare Other

## 2014-03-18 ENCOUNTER — Other Ambulatory Visit (HOSPITAL_BASED_OUTPATIENT_CLINIC_OR_DEPARTMENT_OTHER): Payer: Medicare Other | Admitting: Lab

## 2014-03-18 ENCOUNTER — Encounter: Payer: Self-pay | Admitting: Hematology & Oncology

## 2014-03-18 ENCOUNTER — Ambulatory Visit (HOSPITAL_BASED_OUTPATIENT_CLINIC_OR_DEPARTMENT_OTHER): Payer: Medicare Other

## 2014-03-18 ENCOUNTER — Ambulatory Visit (HOSPITAL_BASED_OUTPATIENT_CLINIC_OR_DEPARTMENT_OTHER): Payer: Medicare Other | Admitting: Hematology & Oncology

## 2014-03-18 VITALS — BP 141/90 | HR 74 | Temp 97.9°F | Resp 16 | Ht 66.0 in | Wt 248.0 lb

## 2014-03-18 DIAGNOSIS — Z5111 Encounter for antineoplastic chemotherapy: Secondary | ICD-10-CM | POA: Diagnosis not present

## 2014-03-18 DIAGNOSIS — C787 Secondary malignant neoplasm of liver and intrahepatic bile duct: Secondary | ICD-10-CM

## 2014-03-18 DIAGNOSIS — C50919 Malignant neoplasm of unspecified site of unspecified female breast: Secondary | ICD-10-CM

## 2014-03-18 DIAGNOSIS — D509 Iron deficiency anemia, unspecified: Secondary | ICD-10-CM

## 2014-03-18 DIAGNOSIS — D500 Iron deficiency anemia secondary to blood loss (chronic): Secondary | ICD-10-CM

## 2014-03-18 DIAGNOSIS — M069 Rheumatoid arthritis, unspecified: Secondary | ICD-10-CM | POA: Diagnosis not present

## 2014-03-18 DIAGNOSIS — D510 Vitamin B12 deficiency anemia due to intrinsic factor deficiency: Secondary | ICD-10-CM

## 2014-03-18 DIAGNOSIS — C50912 Malignant neoplasm of unspecified site of left female breast: Secondary | ICD-10-CM

## 2014-03-18 DIAGNOSIS — E538 Deficiency of other specified B group vitamins: Secondary | ICD-10-CM | POA: Diagnosis not present

## 2014-03-18 LAB — COMPREHENSIVE METABOLIC PANEL
ALK PHOS: 80 U/L (ref 39–117)
ALT: 10 U/L (ref 0–35)
AST: 12 U/L (ref 0–37)
Albumin: 3.5 g/dL (ref 3.5–5.2)
BILIRUBIN TOTAL: 0.3 mg/dL (ref 0.2–1.2)
BUN: 22 mg/dL (ref 6–23)
CO2: 23 mEq/L (ref 19–32)
CREATININE: 1.21 mg/dL — AB (ref 0.50–1.10)
Calcium: 9 mg/dL (ref 8.4–10.5)
Chloride: 109 mEq/L (ref 96–112)
Glucose, Bld: 98 mg/dL (ref 70–99)
Potassium: 3.7 mEq/L (ref 3.5–5.3)
SODIUM: 142 meq/L (ref 135–145)
Total Protein: 6.3 g/dL (ref 6.0–8.3)

## 2014-03-18 LAB — CBC WITH DIFFERENTIAL (CANCER CENTER ONLY)
BASO#: 0.1 10*3/uL (ref 0.0–0.2)
BASO%: 0.9 % (ref 0.0–2.0)
EOS%: 4 % (ref 0.0–7.0)
Eosinophils Absolute: 0.2 10*3/uL (ref 0.0–0.5)
HEMATOCRIT: 34.1 % — AB (ref 34.8–46.6)
HGB: 11.3 g/dL — ABNORMAL LOW (ref 11.6–15.9)
LYMPH#: 1.8 10*3/uL (ref 0.9–3.3)
LYMPH%: 32.2 % (ref 14.0–48.0)
MCH: 32 pg (ref 26.0–34.0)
MCHC: 33.1 g/dL (ref 32.0–36.0)
MCV: 97 fL (ref 81–101)
MONO#: 0.6 10*3/uL (ref 0.1–0.9)
MONO%: 10.5 % (ref 0.0–13.0)
NEUT#: 3 10*3/uL (ref 1.5–6.5)
NEUT%: 52.4 % (ref 39.6–80.0)
PLATELETS: 160 10*3/uL (ref 145–400)
RBC: 3.53 10*6/uL — ABNORMAL LOW (ref 3.70–5.32)
RDW: 12.3 % (ref 11.1–15.7)
WBC: 5.7 10*3/uL (ref 3.9–10.0)

## 2014-03-18 LAB — FERRITIN CHCC: Ferritin: 606 ng/ml — ABNORMAL HIGH (ref 9–269)

## 2014-03-18 LAB — IRON AND TIBC CHCC
%SAT: 29 % (ref 21–57)
IRON: 56 ug/dL (ref 41–142)
TIBC: 194 ug/dL — ABNORMAL LOW (ref 236–444)
UIBC: 138 ug/dL (ref 120–384)

## 2014-03-18 LAB — CANCER ANTIGEN 27.29: CA 27.29: 23 U/mL (ref 0–39)

## 2014-03-18 MED ORDER — CYANOCOBALAMIN 1000 MCG/ML IJ SOLN
1000.0000 ug | Freq: Once | INTRAMUSCULAR | Status: AC
  Administered 2014-03-18: 1000 ug via INTRAMUSCULAR

## 2014-03-18 MED ORDER — FULVESTRANT 250 MG/5ML IM SOLN
INTRAMUSCULAR | Status: AC
  Filled 2014-03-18: qty 10

## 2014-03-18 MED ORDER — FULVESTRANT 250 MG/5ML IM SOLN
500.0000 mg | Freq: Once | INTRAMUSCULAR | Status: AC
  Administered 2014-03-18: 500 mg via INTRAMUSCULAR
  Filled 2014-03-18: qty 10

## 2014-03-18 MED ORDER — SODIUM CHLORIDE 0.9 % IV SOLN
1020.0000 mg | Freq: Once | INTRAVENOUS | Status: AC
  Administered 2014-03-18: 1020 mg via INTRAVENOUS
  Filled 2014-03-18: qty 34

## 2014-03-18 MED ORDER — CYANOCOBALAMIN 1000 MCG/ML IJ SOLN
INTRAMUSCULAR | Status: AC
  Filled 2014-03-18: qty 1

## 2014-03-18 MED ORDER — HYDROMORPHONE HCL 1 MG/ML IJ SOLN
INTRAMUSCULAR | Status: AC
  Filled 2014-03-18: qty 1

## 2014-03-18 MED ORDER — HYDROMORPHONE HCL PF 1 MG/ML IJ SOLN
1.0000 mg | Freq: Once | INTRAMUSCULAR | Status: AC
  Administered 2014-03-18: 1 mg via SUBCUTANEOUS
  Filled 2014-03-18: qty 1

## 2014-03-18 NOTE — Patient Instructions (Signed)
Ferumoxytol injection What is this medicine? FERUMOXYTOL is an iron complex. Iron is used to make healthy red blood cells, which carry oxygen and nutrients throughout the body. This medicine is used to treat iron deficiency anemia in people with chronic kidney disease. This medicine may be used for other purposes; ask your health care provider or pharmacist if you have questions. COMMON BRAND NAME(S): Feraheme What should I tell my health care provider before I take this medicine? They need to know if you have any of these conditions: -anemia not caused by low iron levels -high levels of iron in the blood -magnetic resonance imaging (MRI) test scheduled -an unusual or allergic reaction to iron, other medicines, foods, dyes, or preservatives -pregnant or trying to get pregnant -breast-feeding How should I use this medicine? This medicine is for injection into a vein. It is given by a health care professional in a hospital or clinic setting. Talk to your pediatrician regarding the use of this medicine in children. Special care may be needed. Overdosage: If you think you've taken too much of this medicine contact a poison control center or emergency room at once. Overdosage: If you think you have taken too much of this medicine contact a poison control center or emergency room at once. NOTE: This medicine is only for you. Do not share this medicine with others. What if I miss a dose? It is important not to miss your dose. Call your doctor or health care professional if you are unable to keep an appointment. What may interact with this medicine? This medicine may interact with the following medications: -other iron products This list may not describe all possible interactions. Give your health care provider a list of all the medicines, herbs, non-prescription drugs, or dietary supplements you use. Also tell them if you smoke, drink alcohol, or use illegal drugs. Some items may interact with your  medicine. What should I watch for while using this medicine? Visit your doctor or healthcare professional regularly. Tell your doctor or healthcare professional if your symptoms do not start to get better or if they get worse. You may need blood work done while you are taking this medicine. You may need to follow a special diet. Talk to your doctor. Foods that contain iron include: whole grains/cereals, dried fruits, beans, or peas, leafy green vegetables, and organ meats (liver, kidney). What side effects may I notice from receiving this medicine? Side effects that you should report to your doctor or health care professional as soon as possible: -allergic reactions like skin rash, itching or hives, swelling of the face, lips, or tongue -breathing problems -changes in blood pressure -feeling faint or lightheaded, falls -fever or chills -flushing, sweating, or hot feelings -swelling of the ankles or feet Side effects that usually do not require medical attention (Report these to your doctor or health care professional if they continue or are bothersome.): -diarrhea -headache -nausea, vomiting -stomach pain This list may not describe all possible side effects. Call your doctor for medical advice about side effects. You may report side effects to FDA at 1-800-FDA-1088. Where should I keep my medicine? This drug is given in a hospital or clinic and will not be stored at home. NOTE: This sheet is a summary. It may not cover all possible information. If you have questions about this medicine, talk to your doctor, pharmacist, or health care provider.  2015, Elsevier/Gold Standard. (2011-12-01 15:23:36) Cyanocobalamin, Vitamin B12 injection What is this medicine? CYANOCOBALAMIN (sye an oh koe BAL   a min) is a man made form of vitamin B12. Vitamin B12 is used in the growth of healthy blood cells, nerve cells, and proteins in the body. It also helps with the metabolism of fats and carbohydrates. This  medicine is used to treat people who can not absorb vitamin B12. This medicine may be used for other purposes; ask your health care provider or pharmacist if you have questions. COMMON BRAND NAME(S): Cyomin, LA-12, Nutri-Twelve, Primabalt What should I tell my health care provider before I take this medicine? They need to know if you have any of these conditions: -kidney disease -Leber's disease -megaloblastic anemia -an unusual or allergic reaction to cyanocobalamin, cobalt, other medicines, foods, dyes, or preservatives -pregnant or trying to get pregnant -breast-feeding How should I use this medicine? This medicine is injected into a muscle or deeply under the skin. It is usually given by a health care professional in a clinic or doctor's office. However, your doctor may teach you how to inject yourself. Follow all instructions. Talk to your pediatrician regarding the use of this medicine in children. Special care may be needed. Overdosage: If you think you have taken too much of this medicine contact a poison control center or emergency room at once. NOTE: This medicine is only for you. Do not share this medicine with others. What if I miss a dose? If you are given your dose at a clinic or doctor's office, call to reschedule your appointment. If you give your own injections and you miss a dose, take it as soon as you can. If it is almost time for your next dose, take only that dose. Do not take double or extra doses. What may interact with this medicine? -colchicine -heavy alcohol intake This list may not describe all possible interactions. Give your health care provider a list of all the medicines, herbs, non-prescription drugs, or dietary supplements you use. Also tell them if you smoke, drink alcohol, or use illegal drugs. Some items may interact with your medicine. What should I watch for while using this medicine? Visit your doctor or health care professional regularly. You may need  blood work done while you are taking this medicine. You may need to follow a special diet. Talk to your doctor. Limit your alcohol intake and avoid smoking to get the best benefit. What side effects may I notice from receiving this medicine? Side effects that you should report to your doctor or health care professional as soon as possible: -allergic reactions like skin rash, itching or hives, swelling of the face, lips, or tongue -blue tint to skin -chest tightness, pain -difficulty breathing, wheezing -dizziness -red, swollen painful area on the leg Side effects that usually do not require medical attention (report to your doctor or health care professional if they continue or are bothersome): -diarrhea -headache This list may not describe all possible side effects. Call your doctor for medical advice about side effects. You may report side effects to FDA at 1-800-FDA-1088. Where should I keep my medicine? Keep out of the reach of children. Store at room temperature between 15 and 30 degrees C (59 and 85 degrees F). Protect from light. Throw away any unused medicine after the expiration date. NOTE: This sheet is a summary. It may not cover all possible information. If you have questions about this medicine, talk to your doctor, pharmacist, or health care provider.  2015, Elsevier/Gold Standard. (2007-07-29 22:10:20)  

## 2014-03-19 NOTE — Progress Notes (Signed)
Hematology and Oncology Follow Up Visit  Madelyne Millikan [id number] 1955/06/07 58 y.o. 03/19/2014   Principle Diagnosis:  1. Metastatic breast cancer, solitary liver metastasis. 2. Gastric bypass for subsequent B12 and iron deficiency. 3. Osteoarthritis with chronic pain. 4. 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  Current Therapy:   1. Faslodex 500 mg IM monthly. 2. Vitamin B12 1 mg IM monthly. 3. IV iron as indicated.     Interim History:  Ms.  Ronn Melena is back for follow-up. She has been doing okay. She apparently fell and broke her left arm. This is down by her wrist. She has her splint off.  She has healed up from the MRSA infection that occurred with the left breast implant. She required quite a bit surgery for this. This was several months ago.  She is set up for a PET scan later on this week to assess for the breast cancer.  She's had chronic arthritic issues. She has rheumatoid arthritis.  Her last CA 27.29 back in October was 26.  She's had no problems with bowels or bladder. She may be a little bit constipated. Patient had no cough. There is no shortness of breath.  She's had no nausea or vomiting. She's had occasional headache.  Overall, her performance status is ECOG 2.  Medications: Current outpatient prescriptions: acetaminophen (TYLENOL) 500 MG tablet, Take 1,000 mg by mouth every 6 (six) hours as needed for mild pain or moderate pain., Disp: , Rfl: ;  buPROPion (WELLBUTRIN SR) 150 MG 12 hr tablet, Take 150 mg by mouth 2 (two) times daily., Disp: , Rfl: ;  carvedilol (COREG) 12.5 MG tablet, Take 12.5 mg by mouth 2 (two) times daily., Disp: , Rfl:  FLUoxetine (PROZAC) 40 MG capsule, Take 40 mg by mouth daily before breakfast. , Disp: , Rfl: ;  fulvestrant (FASLODEX) 250 MG/5ML injection, Inject 250 mg into the muscle every 30 (thirty) days. One injection each buttock over 1-2 minutes. Warm prior to use., Disp: , Rfl: ;  furosemide (LASIX) 20 MG tablet, TAKE  1 TABLET BY MOUTH EVERY DAY, Disp: 90 tablet, Rfl: 0 levothyroxine (SYNTHROID, LEVOTHROID) 300 MCG tablet, Take 300 mcg by mouth daily before breakfast., Disp: , Rfl: ;  methocarbamol (ROBAXIN) 750 MG tablet, Take 750 mg by mouth 4 (four) times daily., Disp: , Rfl: ;  OxyCODONE (OXYCONTIN) 10 mg T12A 12 hr tablet, Take 10 mg by mouth every 12 (twelve) hours. , Disp: , Rfl:  oxyCODONE-acetaminophen (PERCOCET) 10-325 MG per tablet, Take 1 tablet by mouth every 6 (six) hours as needed for pain., Disp: 20 tablet, Rfl: 0;  pregabalin (LYRICA) 200 MG capsule, Take 200 mg by mouth 3 (three) times daily., Disp: , Rfl: ;  promethazine (PHENERGAN) 25 MG tablet, Take 1 tablet (25 mg total) by mouth every 8 (eight) hours as needed for nausea or vomiting., Disp: 30 tablet, Rfl: 2 temazepam (RESTORIL) 15 MG capsule, Take 1 capsule (15 mg total) by mouth at bedtime as needed for sleep., Disp: 30 capsule, Rfl: 3;  tiZANidine (ZANAFLEX) 4 MG tablet, Take 4 mg by mouth every 6 (six) hours as needed for muscle spasms., Disp: , Rfl:   Allergies:  Allergies  Allergen Reactions  . Influenza Vac Split [Flu Virus Vaccine] Other (See Comments)    Joint stiffness and renal failure  . Zostavax [Zoster Vaccine Live ([employer])] Other (See Comments)    Joint stiffness, renal failure  . Adhesive [Tape] Itching and Rash    Past Medical History,  Surgical history, Social history, and Family History were reviewed and updated.  Review of Systems: As above  Physical Exam:  height is 5\' 6"  (1.676 m) and weight is 248 lb (112.492 kg). Her oral temperature is 97.9 F (36.6 C). Her blood pressure is 141/90 and her pulse is 74. Her respiration is 16.   Obese white female in no obvious distress. Head and neck exam shows no ocular or oral lesions. There are no palpable cervical or supraclavicular lymph nodes. Lungs are clear. Cardiac exam regular rate and rhythm with no murmurs, rubs or bruits. Abdomen is soft. She is mildly obese.  She has good bowel sounds. There is no fluid wave. There is no palpable liver or spleen tip. Back exam shows no tenderness over the spine, ribs or hips. Extremities shows some tenderness over the distal left forearm. She has some decreased range of motion of the left wrist. She has good pulses in her distal extremities. She has no edema in her lower legs. Skin exam shows no rashes, ecchymoses or petechia. Neurological exam is non-focal.  Lab Results  Component Value Date   WBC 5.7 03/18/2014   HGB 11.3* 03/18/2014   HCT 34.1* 03/18/2014   MCV 97 03/18/2014   PLT 160 03/18/2014     Chemistry      Component Value Date/Time   NA 142 03/18/2014 0821   NA 144 11/25/2013 0947   K 3.7 03/18/2014 0821   K 3.9 11/25/2013 0947   CL 109 03/18/2014 0821   CL 102 11/25/2013 0947   CO2 23 03/18/2014 0821   CO2 29 11/25/2013 0947   BUN 22 03/18/2014 0821   BUN 31* 11/25/2013 0947   CREATININE 1.21* 03/18/2014 0821   CREATININE 1.3* 11/25/2013 0947      Component Value Date/Time   CALCIUM 9.0 03/18/2014 0821   CALCIUM 9.0 11/25/2013 0947   ALKPHOS 80 03/18/2014 0821   ALKPHOS 68 11/25/2013 0947   AST 12 03/18/2014 0821   AST 18 11/25/2013 0947   ALT 10 03/18/2014 0821   ALT 10 11/25/2013 0947   BILITOT 0.3 03/18/2014 0821   BILITOT 0.40 11/25/2013 0947         Impression and Plan: [name] is 58 year old female. She has metastatic breast cancer. Her metastatic disease, thank you, was just solitary in the liver. She underwent radiofrequency ablation of this lesion. This was probably 2 years ago. She actually had this back in September 2013.  She is on Faslodex. She doing well with Faslodex. We will see what the PET scan shows.  We will go ahead and give her iron today. She was as well with iron. Her iron studies show her ferritin of 606. Her iron saturation is only 29%.  We will go ahead and plan to get her back to see [place] in another month. Volanda Napoleon, MD 11/19/20157:36  AM

## 2014-03-20 ENCOUNTER — Encounter (HOSPITAL_COMMUNITY): Admission: RE | Admit: 2014-03-20 | Payer: Medicare Other | Source: Ambulatory Visit

## 2014-03-23 ENCOUNTER — Telehealth: Payer: Self-pay | Admitting: Hematology & Oncology

## 2014-03-23 NOTE — Telephone Encounter (Signed)
Pt made 12-10 PET aware to be NPO 6 hrs

## 2014-04-08 DIAGNOSIS — N644 Mastodynia: Secondary | ICD-10-CM | POA: Diagnosis not present

## 2014-04-08 DIAGNOSIS — M25532 Pain in left wrist: Secondary | ICD-10-CM | POA: Diagnosis not present

## 2014-04-08 DIAGNOSIS — G603 Idiopathic progressive neuropathy: Secondary | ICD-10-CM | POA: Diagnosis not present

## 2014-04-08 DIAGNOSIS — M4726 Other spondylosis with radiculopathy, lumbar region: Secondary | ICD-10-CM | POA: Diagnosis not present

## 2014-04-09 ENCOUNTER — Encounter (HOSPITAL_COMMUNITY): Payer: Self-pay

## 2014-04-09 ENCOUNTER — Ambulatory Visit (HOSPITAL_COMMUNITY)
Admission: RE | Admit: 2014-04-09 | Discharge: 2014-04-09 | Disposition: A | Discharge status: Home / Self Care | Payer: Medicare Other | Source: Ambulatory Visit | Attending: Family | Admitting: Family

## 2014-04-09 DIAGNOSIS — C50919 Malignant neoplasm of unspecified site of unspecified female breast: Secondary | ICD-10-CM | POA: Diagnosis not present

## 2014-04-09 LAB — GLUCOSE, CAPILLARY: Glucose-Capillary: 95 mg/dL (ref 70–99)

## 2014-04-09 MED ORDER — FLUDEOXYGLUCOSE F - 18 (FDG) INJECTION
11.4000 | Freq: Once | INTRAVENOUS | Status: AC | PRN
  Administered 2014-04-09: 11.4 via INTRAVENOUS

## 2014-04-14 ENCOUNTER — Encounter: Payer: Self-pay | Admitting: Hematology & Oncology

## 2014-04-15 ENCOUNTER — Telehealth: Payer: Self-pay | Admitting: Hematology & Oncology

## 2014-04-15 NOTE — Telephone Encounter (Signed)
Tiffany in IR is aware and they have contacted pt to schedule

## 2014-04-15 NOTE — Addendum Note (Signed)
Addended by: Burney Gauze R on: 04/15/2014 10:50 AM   Modules accepted: Orders

## 2014-04-16 ENCOUNTER — Telehealth: Payer: Self-pay | Admitting: Hematology & Oncology

## 2014-04-16 ENCOUNTER — Ambulatory Visit (HOSPITAL_BASED_OUTPATIENT_CLINIC_OR_DEPARTMENT_OTHER): Payer: Medicare Other

## 2014-04-16 ENCOUNTER — Encounter: Payer: Self-pay | Admitting: Family

## 2014-04-16 ENCOUNTER — Ambulatory Visit (HOSPITAL_BASED_OUTPATIENT_CLINIC_OR_DEPARTMENT_OTHER): Payer: Medicare Other | Admitting: Family

## 2014-04-16 ENCOUNTER — Other Ambulatory Visit (HOSPITAL_BASED_OUTPATIENT_CLINIC_OR_DEPARTMENT_OTHER): Payer: Medicare Other | Admitting: Lab

## 2014-04-16 DIAGNOSIS — G8929 Other chronic pain: Secondary | ICD-10-CM | POA: Diagnosis not present

## 2014-04-16 DIAGNOSIS — C50912 Malignant neoplasm of unspecified site of left female breast: Secondary | ICD-10-CM

## 2014-04-16 DIAGNOSIS — Z5111 Encounter for antineoplastic chemotherapy: Secondary | ICD-10-CM

## 2014-04-16 DIAGNOSIS — M1990 Unspecified osteoarthritis, unspecified site: Secondary | ICD-10-CM | POA: Diagnosis not present

## 2014-04-16 DIAGNOSIS — D510 Vitamin B12 deficiency anemia due to intrinsic factor deficiency: Secondary | ICD-10-CM

## 2014-04-16 DIAGNOSIS — E876 Hypokalemia: Secondary | ICD-10-CM

## 2014-04-16 DIAGNOSIS — K769 Liver disease, unspecified: Secondary | ICD-10-CM | POA: Diagnosis not present

## 2014-04-16 DIAGNOSIS — D500 Iron deficiency anemia secondary to blood loss (chronic): Secondary | ICD-10-CM | POA: Diagnosis not present

## 2014-04-16 LAB — CMP (CANCER CENTER ONLY)
ALT(SGPT): 28 U/L (ref 10–47)
AST: 16 U/L (ref 11–38)
Albumin: 3 g/dL — ABNORMAL LOW (ref 3.3–5.5)
Alkaline Phosphatase: 97 U/L — ABNORMAL HIGH (ref 26–84)
BILIRUBIN TOTAL: 0.5 mg/dL (ref 0.20–1.60)
BUN: 38 mg/dL — AB (ref 7–22)
CALCIUM: 8.8 mg/dL (ref 8.0–10.3)
CHLORIDE: 102 meq/L (ref 98–108)
CO2: 28 meq/L (ref 18–33)
Creat: 1.4 mg/dl — ABNORMAL HIGH (ref 0.6–1.2)
GLUCOSE: 138 mg/dL — AB (ref 73–118)
Potassium: 2.8 mEq/L — CL (ref 3.3–4.7)
Sodium: 138 mEq/L (ref 128–145)
Total Protein: 6.9 g/dL (ref 6.4–8.1)

## 2014-04-16 LAB — IRON AND TIBC CHCC
%SAT: 14 % — AB (ref 21–57)
Iron: 24 ug/dL — ABNORMAL LOW (ref 41–142)
TIBC: 176 ug/dL — AB (ref 236–444)
UIBC: 152 ug/dL (ref 120–384)

## 2014-04-16 LAB — CBC WITH DIFFERENTIAL (CANCER CENTER ONLY)
BASO#: 0 10*3/uL (ref 0.0–0.2)
BASO%: 0.1 % (ref 0.0–2.0)
EOS%: 1.8 % (ref 0.0–7.0)
Eosinophils Absolute: 0.2 10*3/uL (ref 0.0–0.5)
HEMATOCRIT: 35.3 % (ref 34.8–46.6)
HEMOGLOBIN: 11.9 g/dL (ref 11.6–15.9)
LYMPH#: 0.7 10*3/uL — ABNORMAL LOW (ref 0.9–3.3)
LYMPH%: 7.3 % — ABNORMAL LOW (ref 14.0–48.0)
MCH: 32 pg (ref 26.0–34.0)
MCHC: 33.7 g/dL (ref 32.0–36.0)
MCV: 95 fL (ref 81–101)
MONO#: 0.7 10*3/uL (ref 0.1–0.9)
MONO%: 7.3 % (ref 0.0–13.0)
NEUT#: 8.1 10*3/uL — ABNORMAL HIGH (ref 1.5–6.5)
NEUT%: 83.5 % — AB (ref 39.6–80.0)
Platelets: 182 10*3/uL (ref 145–400)
RBC: 3.72 10*6/uL (ref 3.70–5.32)
RDW: 12.8 % (ref 11.1–15.7)
WBC: 9.6 10*3/uL (ref 3.9–10.0)

## 2014-04-16 LAB — CANCER ANTIGEN 27.29: CA 27.29: 26 U/mL (ref 0–39)

## 2014-04-16 LAB — FERRITIN CHCC

## 2014-04-16 MED ORDER — POTASSIUM CHLORIDE ER 10 MEQ PO TBCR: 40.0000 meq | EXTENDED_RELEASE_TABLET | Freq: Every day | ORAL | Status: DC

## 2014-04-16 MED ORDER — FULVESTRANT 250 MG/5ML IM SOLN
500.0000 mg | Freq: Once | INTRAMUSCULAR | Status: AC
  Administered 2014-04-16: 500 mg via INTRAMUSCULAR
  Filled 2014-04-16: qty 10

## 2014-04-16 MED ORDER — HYDROMORPHONE HCL 1 MG/ML IJ SOLN
INTRAMUSCULAR | Status: AC
  Filled 2014-04-16: qty 1

## 2014-04-16 MED ORDER — CYANOCOBALAMIN 1000 MCG/ML IJ SOLN
1000.0000 ug | Freq: Once | INTRAMUSCULAR | Status: AC
  Administered 2014-04-16: 1000 ug via INTRAMUSCULAR

## 2014-04-16 MED ORDER — HYDROMORPHONE HCL 4 MG/ML IJ SOLN: 1.0000 mg | Freq: Once | INTRAMUSCULAR | Status: DC

## 2014-04-16 MED ORDER — HYDROMORPHONE HCL 4 MG/ML IJ SOLN
1.0000 mg | Freq: Once | INTRAMUSCULAR | Status: AC
  Administered 2014-04-16: 1 mg via SUBCUTANEOUS

## 2014-04-16 MED ORDER — FULVESTRANT 250 MG/5ML IM SOLN
INTRAMUSCULAR | Status: AC
  Filled 2014-04-16: qty 5

## 2014-04-16 MED ORDER — CYANOCOBALAMIN 1000 MCG/ML IJ SOLN
INTRAMUSCULAR | Status: AC
  Filled 2014-04-16: qty 1

## 2014-04-16 NOTE — Progress Notes (Signed)
. [place]  Telephone:([phone] Fax:([phone]) [phone]  ID: Kerri Vasquez OB: January 20, 1956 MR#: [id number] RWE#:[id number] Patient Care Team: No Pcp Per Patient as PCP - General (General Practice)  DIAGNOSIS: 1. Metastatic breast cancer, solitary liver metastasis.  2. Gastric bypass for subsequent B12 and iron deficiency.  3. Osteoarthritis with chronic pain.      4. 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  INTERVAL HISTORY: Kerri Vasquez is here today for a follow-up. She is having some pain from her hernia and her arthritis. She still goes for steroid injections periodically.  Her PET scan earlier this month showed a new spot on her liver that is suspicious for metastasis. Dr. Marin Olp ordered an RFA and biopsy of the lesion. We will see what this shows and adjust her treatment accordingly.  Her CA 27-29 in November was 23.  She does get B12 and and Faslodex monthly.  She denies n/v, fever, chills, cough, rash, headache, dizziness, SOB, chest pain, palpitations, abdominal pain, constipation, diarrhea, blood in urine or stool.  She denies any swelling, tenderness, numbness or tingling in her extremities.  She stays tired a lot of the time. Her appetite is good and she is staying hydrated. Her weight is down 7 lbs since her last visit.  She lives with her daughter who helps with her care.    CURRENT TREATMENT: 1. Faslodex 500 mg IM monthly.  2. Vitamin B12 1 mg IM monthly.  3. IV iron as indicated.  REVIEW OF SYSTEMS: All other 10 point review of systems is negative except for those issues mentioned above.   PAST MEDICAL HISTORY: Past Medical History  Diagnosis Date  . Depression   . Hypertension   . Migraine   . Hypothyroidism   . Incisional hernia   . Spinal stenosis   . Scoliosis   . Sciatica   . Intestinal obstruction   . GERD (gastroesophageal reflux disease)   . Septic arthritis 01/28/10    and spinal stenosis , moderate scoliosis   .  Osteoarthritis of multiple joints   . Anemia   . Pernicious anemia 05/30/2011    Iron transfusion and VB12 on 06/06/11  . Trouble swallowing   . Leg swelling     right   . Abdominal pain   . Constipation   . Nausea & vomiting   . Sleep apnea     hx of off machine x 4 years   . Diabetes mellitus     type II, controlled by diet 05/25/11   . 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 Renal insufficiency     hx renal failure 2011  . Fibromyalgia    PAST SURGICAL HISTORY: Past Surgical History  Procedure Laterality Date  . Tonsillectomy  1969  . Cystectomy  2009    left breast  . Bariatric surgery  2006  . Other surgical history      surgery for intestinal blockage 2011   . Multiple extractions with alveoloplasty  06/08/2011    Procedure: MULTIPLE EXTRACION WITH ALVEOLOPLASTY;  Surgeon: Lenn Cal, DDS;  Location: [facility];  Service: Oral Surgery;  Laterality: N/A;  Extraction of tooth #'s 3,4,7 with alveoloplasty and Gross Debridement of Remaining Teeth  . Gastric bypass  2006  . Intestinal blockage  2011    surgery for this   . Breast surgery  2008     bilateral mastectomy  . Breast reconstruction  2008, 2009  .  Cholecystectomy  06/29/85  . Radio frequency ablation to liver  sept 27, 2013  . Thyroid lobectomy  02/16/2012    Procedure: THYROID LOBECTOMY;  Surgeon: Earnstine Regal, MD;  Location: [facility];  Service: General;  Laterality: Right;   FAMILY HISTORY Family History  Problem Relation Age of Onset  . Cancer Mother     breast  . Hypertension Mother   . Anesthesia problems Daughter    GYNECOLOGIC HISTORY:  Patient's last menstrual period was 08/30/2006.   SOCIAL HISTORY:  History   Social History  . Marital Status: Divorced    Spouse Name: N/A    Number of Children: 1  . Years of Education: N/A   Occupational History  . Not on file.   Social History Main Topics  . Smoking status: Never Smoker   . Smokeless tobacco: Never Used      Comment: never used tobacco  . Alcohol Use: No  . Drug Use: No  . Sexual Activity: Not on file   Other Topics Concern  . Not on file   Social History Narrative   Caffeine use: 3 cups coffee/tea daily   Regular exercise: no         ADVANCED DIRECTIVES: <no information>  HEALTH MAINTENANCE: History  Substance Use Topics  . Smoking status: Never Smoker   . Smokeless tobacco: Never Used     Comment: never used tobacco  . Alcohol Use: No   Colonoscopy: PAP: Bone density: Lipid panel:  Allergies  Allergen Reactions  . Influenza Vac Split [Flu Virus Vaccine] Other (See Comments)    Joint stiffness and renal failure  . Zostavax [Zoster Vaccine Live ([employer])] Other (See Comments)    Joint stiffness, renal failure  . Adhesive [Tape] Itching and Rash   Current Outpatient Prescriptions  Medication Sig Dispense Refill  . acetaminophen (TYLENOL) 500 MG tablet Take 1,000 mg by mouth every 6 (six) hours as needed for mild pain or moderate pain.    Marland Kitchen buPROPion (WELLBUTRIN SR) 150 MG 12 hr tablet Take 150 mg by mouth 2 (two) times daily.    . carvedilol (COREG) 12.5 MG tablet Take 12.5 mg by mouth 2 (two) times daily.    Marland Kitchen FLUoxetine (PROZAC) 40 MG capsule Take 40 mg by mouth daily before breakfast.     . furosemide (LASIX) 20 MG tablet TAKE 1 TABLET BY MOUTH EVERY DAY 90 tablet 0  . levothyroxine (SYNTHROID, LEVOTHROID) 300 MCG tablet Take 300 mcg by mouth daily before breakfast.    . methocarbamol (ROBAXIN) 750 MG tablet Take 750 mg by mouth 4 (four) times daily.    . OxyCODONE (OXYCONTIN) 10 mg T12A 12 hr tablet Take 10 mg by mouth every 12 (twelve) hours.     Marland Kitchen oxyCODONE-acetaminophen (PERCOCET) 10-325 MG per tablet Take 1 tablet by mouth every 6 (six) hours as needed for pain. 20 tablet 0  . pregabalin (LYRICA) 200 MG capsule Take 200 mg by mouth 3 (three) times daily.    . promethazine (PHENERGAN) 25 MG tablet Take 1 tablet (25 mg total) by mouth every 8 (eight) hours as  needed for nausea or vomiting. 30 tablet 2  . temazepam (RESTORIL) 15 MG capsule Take 1 capsule (15 mg total) by mouth at bedtime as needed for sleep. 30 capsule 3  . tiZANidine (ZANAFLEX) 4 MG tablet Take 4 mg by mouth every 6 (six) hours as needed for muscle spasms.    . fulvestrant (FASLODEX) 250 MG/5ML injection Inject 250 mg into  the muscle every 30 (thirty) days. One injection each buttock over 1-2 minutes. Warm prior to use.     No current facility-administered medications for this visit.   OBJECTIVE: Filed Vitals:   04/16/14 1044  BP: 93/52  Pulse: 110  Temp: 98.1 F (36.7 C)  Resp: 16   Body mass index is 38.92 kg/(m^2). ECOG FS:0 - Asymptomatic Ocular: Sclerae unicteric, pupils equal, round and reactive to light Ear-nose-throat: Oropharynx clear, dentition fair Lymphatic: No cervical or supraclavicular adenopathy Lungs no rales or rhonchi, good excursion bilaterally Heart regular rate and rhythm, no murmur appreciated Abd soft, nontender, positive bowel sounds MSK no focal spinal tenderness, no joint edema Neuro: non-focal, well-oriented, appropriate affect Breasts: Wound to left breast has healed nicely, no s/s of infection. No mass, rash or lymphedema.   LAB RESULTS: CMP     Component Value Date/Time   NA 142 03/18/2014 0821   NA 144 11/25/2013 0947   K 3.7 03/18/2014 0821   K 3.9 11/25/2013 0947   CL 109 03/18/2014 0821   CL 102 11/25/2013 0947   CO2 23 03/18/2014 0821   CO2 29 11/25/2013 0947   GLUCOSE 98 03/18/2014 0821   GLUCOSE 94 11/25/2013 0947   BUN 22 03/18/2014 0821   BUN 31* 11/25/2013 0947   CREATININE 1.21* 03/18/2014 0821   CREATININE 1.3* 11/25/2013 0947   CALCIUM 9.0 03/18/2014 0821   CALCIUM 9.0 11/25/2013 0947   PROT 6.3 03/18/2014 0821   PROT 7.1 11/25/2013 0947   ALBUMIN 3.5 03/18/2014 0821   AST 12 03/18/2014 0821   AST 18 11/25/2013 0947   ALT 10 03/18/2014 0821   ALT 10 11/25/2013 0947   ALKPHOS 80 03/18/2014 0821   ALKPHOS 68  11/25/2013 0947   BILITOT 0.3 03/18/2014 0821   BILITOT 0.40 11/25/2013 0947   GFRNONAA 80* 07/26/2013 1630   GFRNONAA 64* 03/07/2011 0000   GFRAA >90 07/26/2013 1630   GFRAA 74* 03/07/2011 0000   No results found for: SPEP Lab Results  Component Value Date   WBC 9.6 04/16/2014   NEUTROABS 8.1* 04/16/2014   HGB 11.9 04/16/2014   HCT 35.3 04/16/2014   MCV 95 04/16/2014   PLT 182 04/16/2014   Lab Results  Component Value Date   LABCA2 23 03/18/2014   No components found for: [id number] No results for input(s): INR in the last 168 hours.  STUDIES: No results found.  ASSESSMENT/PLAN: Kerri Vasquez is a pleasant 58 year old white female with metastatic breast cancer. She had a solitary liver lesion from the breast cancer. She did get RFA for this lesion and has been on Faslodex to try to minimize recurrence. She now has another lesion on her liver. She is waiting for an appointment with radiology for another RFA and biopsy.  Depending on the results of the biopsy we may need to add Ibrance to her treatment regimen.  We will go ahead and give her Faslodex and B12 today. Her CBC was ok today. Her potassium today was 2.9 We will have her take [facility] for the next 2 weeks. We will wait and see what the rest of her labs show.  We will see her back in one month for labs and a follow-up.  All questions were answered and she is in agreement with the plan. She knows to call here with any questions or concerns and to go to the ED in the event of an emergency. We can certainly see her sooner if need be.   [place],SARAH  M, NP 04/16/2014 11:26 AM

## 2014-04-16 NOTE — Telephone Encounter (Signed)
Pt aware IR has left message on her home phone to call for appointment. I gave her number to call Tiffany in IR if message is not on her phone

## 2014-04-16 NOTE — Patient Instructions (Signed)
Cyanocobalamin, Vitamin B12 injection What is this medicine? CYANOCOBALAMIN (sye an oh koe BAL a min) is a man made form of vitamin B12. Vitamin B12 is used in the growth of healthy blood cells, nerve cells, and proteins in the body. It also helps with the metabolism of fats and carbohydrates. This medicine is used to treat people who can not absorb vitamin B12. This medicine may be used for other purposes; ask your health care provider or pharmacist if you have questions. COMMON BRAND NAME(S): Cyomin, LA-12, Nutri-Twelve, Primabalt What should I tell my health care provider before I take this medicine? They need to know if you have any of these conditions: -kidney disease -Leber's disease -megaloblastic anemia -an unusual or allergic reaction to cyanocobalamin, cobalt, other medicines, foods, dyes, or preservatives -pregnant or trying to get pregnant -breast-feeding How should I use this medicine? This medicine is injected into a muscle or deeply under the skin. It is usually given by a health care professional in a clinic or doctor's office. However, your doctor may teach you how to inject yourself. Follow all instructions. Talk to your pediatrician regarding the use of this medicine in children. Special care may be needed. Overdosage: If you think you have taken too much of this medicine contact a poison control center or emergency room at once. NOTE: This medicine is only for you. Do not share this medicine with others. What if I miss a dose? If you are given your dose at a clinic or doctor's office, call to reschedule your appointment. If you give your own injections and you miss a dose, take it as soon as you can. If it is almost time for your next dose, take only that dose. Do not take double or extra doses. What may interact with this medicine? -colchicine -heavy alcohol intake This list may not describe all possible interactions. Give your health care provider a list of all the  medicines, herbs, non-prescription drugs, or dietary supplements you use. Also tell them if you smoke, drink alcohol, or use illegal drugs. Some items may interact with your medicine. What should I watch for while using this medicine? Visit your doctor or health care professional regularly. You may need blood work done while you are taking this medicine. You may need to follow a special diet. Talk to your doctor. Limit your alcohol intake and avoid smoking to get the best benefit. What side effects may I notice from receiving this medicine? Side effects that you should report to your doctor or health care professional as soon as possible: -allergic reactions like skin rash, itching or hives, swelling of the face, lips, or tongue -blue tint to skin -chest tightness, pain -difficulty breathing, wheezing -dizziness -red, swollen painful area on the leg Side effects that usually do not require medical attention (report to your doctor or health care professional if they continue or are bothersome): -diarrhea -headache This list may not describe all possible side effects. Call your doctor for medical advice about side effects. You may report side effects to FDA at 1-800-FDA-1088. Where should I keep my medicine? Keep out of the reach of children. Store at room temperature between 15 and 30 degrees C (59 and 85 degrees F). Protect from light. Throw away any unused medicine after the expiration date. NOTE: This sheet is a summary. It may not cover all possible information. If you have questions about this medicine, talk to your doctor, pharmacist, or health care provider.  2015, Elsevier/Gold Standard. (2007-07-29 22:10:20) Fulvestrant   injection What is this medicine? FULVESTRANT (ful VES trant) blocks the effects of estrogen. It is used to treat breast cancer in women past the age of menopause. This medicine may be used for other purposes; ask your health care provider or pharmacist if you have  questions. COMMON BRAND NAME(S): FASLODEX What should I tell my health care provider before I take this medicine? They need to know if you have any of these conditions: -bleeding problems -liver disease -low levels of platelets in the blood -an unusual or allergic reaction to fulvestrant, other medicines, foods, dyes, or preservatives -pregnant or trying to get pregnant -breast-feeding How should I use this medicine? This medicine is for injection into a muscle. It is usually given by a health care professional in a hospital or clinic setting. Talk to your pediatrician regarding the use of this medicine in children. Special care may be needed. Overdosage: If you think you have taken too much of this medicine contact a poison control center or emergency room at once. NOTE: This medicine is only for you. Do not share this medicine with others. What if I miss a dose? It is important not to miss your dose. Call your doctor or health care professional if you are unable to keep an appointment. What may interact with this medicine? -medicines that treat or prevent blood clots like warfarin, enoxaparin, and dalteparin This list may not describe all possible interactions. Give your health care provider a list of all the medicines, herbs, non-prescription drugs, or dietary supplements you use. Also tell them if you smoke, drink alcohol, or use illegal drugs. Some items may interact with your medicine. What should I watch for while using this medicine? Your condition will be monitored carefully while you are receiving this medicine. You will need important blood work done while you are taking this medicine. Do not become pregnant while taking this medicine. Women should inform their doctor if they wish to become pregnant or think they might be pregnant. There is a potential for serious side effects to an unborn child. Talk to your health care professional or pharmacist for more information. What side  effects may I notice from receiving this medicine? Side effects that you should report to your doctor or health care professional as soon as possible: -allergic reactions like skin rash, itching or hives, swelling of the face, lips, or tongue -feeling faint or lightheaded, falls -fever or flu-like symptoms -sore throat -vaginal bleeding Side effects that usually do not require medical attention (report to your doctor or health care professional if they continue or are bothersome): -aches, pains -constipation or diarrhea -headache -hot flashes -nausea, vomiting -pain at site where injected -stomach pain This list may not describe all possible side effects. Call your doctor for medical advice about side effects. You may report side effects to FDA at 1-800-FDA-1088. Where should I keep my medicine? This drug is given in a hospital or clinic and will not be stored at home. NOTE: This sheet is a summary. It may not cover all possible information. If you have questions about this medicine, talk to your doctor, pharmacist, or health care provider.  2015, Elsevier/Gold Standard. (2007-08-26 15:39:24)  

## 2014-04-17 ENCOUNTER — Encounter: Payer: Self-pay | Admitting: *Deleted

## 2014-04-17 ENCOUNTER — Telehealth: Payer: Self-pay | Admitting: *Deleted

## 2014-04-17 ENCOUNTER — Other Ambulatory Visit: Payer: Self-pay | Admitting: *Deleted

## 2014-04-17 DIAGNOSIS — D510 Vitamin B12 deficiency anemia due to intrinsic factor deficiency: Secondary | ICD-10-CM

## 2014-04-17 MED ORDER — SODIUM CHLORIDE 0.9 % IV SOLN
510.0000 mg | Freq: Once | INTRAVENOUS | Status: DC
  Filled 2014-04-17: qty 17

## 2014-04-17 NOTE — Telephone Encounter (Addendum)
Spoke with daughter and appointment made  ----- Message from Volanda Napoleon, MD sent at 04/16/2014  6:00 PM EST ----- Call her and let her know that the iron is actually low. Please set her up with Feraheme 510 mg IV 1 dose. If she wants this when we see her back in 4 weeks that would be okay also. Laurey Arrow

## 2014-04-20 ENCOUNTER — Telehealth: Payer: Self-pay | Admitting: *Deleted

## 2014-04-20 ENCOUNTER — Telehealth: Payer: Self-pay | Admitting: Hematology & Oncology

## 2014-04-20 ENCOUNTER — Encounter: Payer: Self-pay | Admitting: Hematology & Oncology

## 2014-04-20 ENCOUNTER — Other Ambulatory Visit: Payer: Self-pay | Admitting: *Deleted

## 2014-04-20 DIAGNOSIS — C50912 Malignant neoplasm of unspecified site of left female breast: Secondary | ICD-10-CM

## 2014-04-20 MED ORDER — SULFAMETHOXAZOLE-TRIMETHOPRIM 800-160 MG PO TABS: 1.0000 | ORAL_TABLET | Freq: Two times a day (BID) | ORAL | Status: DC

## 2014-04-20 NOTE — Telephone Encounter (Signed)
Received the following message from My Chart  Dr. Jonette Eva or Judson Roch,          Anyway we can get another MRI done on my mothers head? I don't know what is going on, but she's still talking to my relatives that past. And lately some gibberish.. Please let me know if I can get something scheduled. Also, I think she has a UTI. is there something she can take?        Kerri Vasquez     Dr Marin Olp ordered an MRI of head and for patient to start bactrim for her UTI.

## 2014-04-20 NOTE — Telephone Encounter (Signed)
Pt aware of 1-6 MRI per MD ok to schedule 1st week of january

## 2014-04-21 ENCOUNTER — Telehealth: Payer: Self-pay | Admitting: Hematology & Oncology

## 2014-04-21 NOTE — Telephone Encounter (Signed)
Pt aware moved 1-13 to 1-14, she is also aware to call Tammy at [facility] [phone] to schedule 1-13 appointment. MD aware

## 2014-04-23 ENCOUNTER — Ambulatory Visit: Payer: Self-pay

## 2014-05-06 ENCOUNTER — Ambulatory Visit (HOSPITAL_BASED_OUTPATIENT_CLINIC_OR_DEPARTMENT_OTHER): Payer: Medicare Other

## 2014-05-07 ENCOUNTER — Telehealth: Payer: Self-pay | Admitting: Hematology & Oncology

## 2014-05-07 DIAGNOSIS — M4726 Other spondylosis with radiculopathy, lumbar region: Secondary | ICD-10-CM | POA: Diagnosis not present

## 2014-05-07 DIAGNOSIS — G603 Idiopathic progressive neuropathy: Secondary | ICD-10-CM | POA: Diagnosis not present

## 2014-05-07 DIAGNOSIS — N644 Mastodynia: Secondary | ICD-10-CM | POA: Diagnosis not present

## 2014-05-07 DIAGNOSIS — M25532 Pain in left wrist: Secondary | ICD-10-CM | POA: Diagnosis not present

## 2014-05-07 NOTE — Telephone Encounter (Signed)
MD aware 1-6 MRI moved to 1-16 and he still wants to see her on 1-14

## 2014-05-13 ENCOUNTER — Other Ambulatory Visit: Payer: Self-pay | Admitting: Lab

## 2014-05-13 ENCOUNTER — Ambulatory Visit: Payer: Self-pay

## 2014-05-13 ENCOUNTER — Ambulatory Visit
Admission: RE | Admit: 2014-05-13 | Discharge: 2014-05-13 | Disposition: A | Discharge status: Home / Self Care | Payer: Medicare Other | Source: Ambulatory Visit | Attending: Hematology & Oncology | Admitting: Hematology & Oncology

## 2014-05-13 ENCOUNTER — Ambulatory Visit: Payer: Self-pay | Admitting: Hematology & Oncology

## 2014-05-13 DIAGNOSIS — C50912 Malignant neoplasm of unspecified site of left female breast: Secondary | ICD-10-CM

## 2014-05-13 DIAGNOSIS — D500 Iron deficiency anemia secondary to blood loss (chronic): Secondary | ICD-10-CM

## 2014-05-13 DIAGNOSIS — R160 Hepatomegaly, not elsewhere classified: Secondary | ICD-10-CM | POA: Diagnosis not present

## 2014-05-13 DIAGNOSIS — D510 Vitamin B12 deficiency anemia due to intrinsic factor deficiency: Secondary | ICD-10-CM

## 2014-05-13 DIAGNOSIS — C50911 Malignant neoplasm of unspecified site of right female breast: Secondary | ICD-10-CM | POA: Diagnosis not present

## 2014-05-13 HISTORY — PX: IR GENERIC HISTORICAL: IMG1180011

## 2014-05-13 NOTE — Progress Notes (Signed)
Low appetite  Denied fever, pain,  Still on injection 1 injection monthly. Pos. PET Scan- Dr Arelia Sneddon referred back for another ablation.

## 2014-05-13 NOTE — Consult Note (Signed)
Chief Complaint: Chief Complaint  Patient presents with  . Follow-up    talk about another liver treatment    Referring Physician(s): Ennever,Peter R  History of Present Illness: Kerri Vasquez is a 59 y.o. female with a history of right breast carcinoma approximately 8years ago and underwent bilateral mastectomy at that time. On a recent abdominal CT scan to evaluate abdominal pain and a ventral hernia, a new liver lesion was identified, confirmed on ultrasound-guided FNA biopsy 11/27/11 to represent metastatic breast carcinoma. PET CT showed no other metastatic disease. She had biopsy-proven metastatic disease to solitary lesion in the liver, and underwent RF ablation on 01/26/2012. Follow-up surveillance imaging had remained negative until her most recent PET CT, which demonstrated new hypermetabolic solitary liver lesion.  Past Medical History  Diagnosis Date  . Depression   . Hypertension   . Migraine   . Hypothyroidism   . Incisional hernia   . Spinal stenosis   . Scoliosis   . Sciatica   . Intestinal obstruction   . GERD (gastroesophageal reflux disease)   . Septic arthritis 01/28/10    and spinal stenosis , moderate scoliosis   . Osteoarthritis of multiple joints   . Anemia   . Pernicious anemia 05/30/2011    Iron transfusion and VB12 on 06/06/11  . Trouble swallowing   . Leg swelling     right   . Abdominal pain   . Constipation   . Nausea & vomiting   . Sleep apnea     hx of off machine x 4 years   . Diabetes mellitus     type II, controlled by diet 05/25/11   . 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 Renal insufficiency     hx renal failure 2011  . Fibromyalgia     Past Surgical History  Procedure Laterality Date  . Tonsillectomy  1969  . Cystectomy  2009    left breast  . Bariatric surgery  2006  . Other surgical history      surgery for intestinal blockage 2011   . Multiple extractions with alveoloplasty  06/08/2011   Procedure: MULTIPLE EXTRACION WITH ALVEOLOPLASTY;  Surgeon: Lenn Cal, DDS;  Location: [facility];  Service: Oral Surgery;  Laterality: N/A;  Extraction of tooth #'s 3,4,7 with alveoloplasty and Gross Debridement of Remaining Teeth  . Gastric bypass  2006  . Intestinal blockage  2011    surgery for this   . Breast surgery  2008     bilateral mastectomy  . Breast reconstruction  2008, 2009  . Cholecystectomy  06/29/85  . Radio frequency ablation to liver  sept 27, 2013  . Thyroid lobectomy  02/16/2012    Procedure: THYROID LOBECTOMY;  Surgeon: Earnstine Regal, MD;  Location: [facility];  Service: General;  Laterality: Right;    Allergies: Influenza vac split; Zostavax; and Adhesive  Medications: Prior to Admission medications   Medication Sig Start Date End Date Taking? Authorizing Provider  acetaminophen (TYLENOL) 500 MG tablet Take 1,000 mg by mouth every 6 (six) hours as needed for mild pain or moderate pain.    Historical Provider, MD  buPROPion (WELLBUTRIN SR) 150 MG 12 hr tablet Take 150 mg by mouth 2 (two) times daily.    Historical Provider, MD  carvedilol (COREG) 12.5 MG tablet Take 12.5 mg by mouth 2 (two) times daily. 01/23/12   Volanda Napoleon, MD  FLUoxetine (PROZAC) 40 MG capsule Take  40 mg by mouth daily before breakfast.     Historical Provider, MD  fulvestrant (FASLODEX) 250 MG/5ML injection Inject 250 mg into the muscle every 30 (thirty) days. One injection each buttock over 1-2 minutes. Warm prior to use.    Historical Provider, MD  furosemide (LASIX) 20 MG tablet TAKE 1 TABLET BY MOUTH EVERY DAY 02/04/14   Volanda Napoleon, MD  levothyroxine (SYNTHROID, LEVOTHROID) 300 MCG tablet Take 300 mcg by mouth daily before breakfast.    Historical Provider, MD  methocarbamol (ROBAXIN) 750 MG tablet Take 750 mg by mouth 4 (four) times daily.    Historical Provider, MD  OxyCODONE (OXYCONTIN) 10 mg T12A 12 hr tablet Take 10 mg by mouth every 12 (twelve) hours.     Historical Provider, MD    oxyCODONE-acetaminophen (PERCOCET) 10-325 MG per tablet Take 1 tablet by mouth every 6 (six) hours as needed for pain. 01/05/14   Virgel Manifold, MD  potassium chloride (K-DUR) 10 MEQ tablet Take 4 tablets (40 mEq total) by mouth daily. Take 40 meq twice daily for 4 days then 40 meq once daily for 10 days 04/16/14   Eliezer Bottom, NP  pregabalin (LYRICA) 200 MG capsule Take 200 mg by mouth 3 (three) times daily.    Historical Provider, MD  promethazine (PHENERGAN) 25 MG tablet Take 1 tablet (25 mg total) by mouth every 8 (eight) hours as needed for nausea or vomiting. 11/25/13   Volanda Napoleon, MD  sulfamethoxazole-trimethoprim (BACTRIM DS,SEPTRA DS) 800-160 MG per tablet Take 1 tablet by mouth 2 (two) times daily. For 5 days 04/20/14   Volanda Napoleon, MD  temazepam (RESTORIL) 15 MG capsule Take 1 capsule (15 mg total) by mouth at bedtime as needed for sleep. 11/25/13   Volanda Napoleon, MD  tiZANidine (ZANAFLEX) 4 MG tablet Take 4 mg by mouth every 6 (six) hours as needed for muscle spasms.    Historical Provider, MD    Family History  Problem Relation Age of Onset  . Cancer Mother     breast  . Hypertension Mother   . Anesthesia problems Daughter     History   Social History  . Marital Status: Divorced    Spouse Name: N/A    Number of Children: 1  . Years of Education: N/A   Social History Main Topics  . Smoking status: Never Smoker   . Smokeless tobacco: Never Used     Comment: never used tobacco  . Alcohol Use: No  . Drug Use: No  . Sexual Activity: Not on file   Other Topics Concern  . Not on file   Social History Narrative   Caffeine use: 3 cups coffee/tea daily   Regular exercise: no          ECOG Status: 2 - Symptomatic, <50% confined to bed  Review of Systems: A 12 point ROS discussed and pertinent positives are indicated in the HPI above.  All other systems are negative.  Review of Systems  Vital Signs: BP 105/69 mmHg  Pulse 94  Temp(Src) 97.6 F  (36.4 C) (Oral)  Resp 16  SpO2 98%  LMP 08/30/2006  Physical Exam  Imaging:  CLINICAL DATA: Subsequent treatment strategy for breast carcinoma.  EXAM: NUCLEAR MEDICINE PET SKULL BASE TO THIGH  TECHNIQUE: 11.4 mCi F-18 FDG was injected intravenously. Full-ring PET imaging was performed from the skull base to thigh after the radiotracer. CT data was obtained and used for attenuation correction and anatomic localization.  FASTING BLOOD GLUCOSE: Value: 75 mg/dl  COMPARISON: Head CT 03/21/2013, 09/16/2013 mild MRI 11/07/2011.  FINDINGS: NECK  No hypermetabolic lymph nodes in the neck.  CHEST  No hypermetabolic mediastinal or hilar nodes. No suspicious pulmonary nodules on the CT scan.  ABDOMEN/PELVIS  There is a focus of metabolic activity within the central right hepatic lobe which is new from prior and is concerning for hepatic metastasis. This lesions is not seen on the non contrast CT portion exam. Lesion is fairly hyperintense with SUV max 7.4. This lesion is in segment 4A of the left hepatic lobe. This is near the site of prior metastasis (segment 4B).  SKELETON  No focal hypermetabolic activity to suggest skeletal metastasis.  IMPRESSION: New single focus of metabolic activity in the medial left hepatic lobe is most concerning recurrence of liver metastasis.  No additional evidence of metastatic breast cancer   Electronically Signed  By: Suzy Bouchard M.D.  On: 04/09/2014 10:32    Labs:  CBC:  Recent Labs  11/25/13 0947 02/16/14 1042 03/18/14 0821 04/16/14 1013  WBC 5.1 5.3 5.7 9.6  HGB 12.3 11.0* 11.3* 11.9  HCT 37.2 33.5* 34.1* 35.3  PLT 150 163 160 182    COAGS: No results for input(s): INR, APTT in the last 8760 hours.  BMP:  Recent Labs  07/26/13 1630  11/25/13 0947 02/16/14 1042 03/18/14 0821 04/16/14 1013  NA 140  < > 144 142 142 138  K 4.0  < > 3.9 4.0 3.7 2.8*  CL 104  < > 102 111 109 102   CO2 22  < > 29 22 23 28   GLUCOSE 77  < > 94 83 98 138*  BUN 14  < > 31* 17 22 38*  CALCIUM 9.5  < > 9.0 8.6 9.0 8.8  CREATININE 0.80  < > 1.3* 0.96 1.21* 1.4*  GFRNONAA 80*  --   --   --   --   --   GFRAA >90  --   --   --   --   --   < > = values in this interval not displayed.  LIVER FUNCTION TESTS:  Recent Labs  05/28/13 0834  09/15/13 1208 11/25/13 0947 02/16/14 1042 03/18/14 0821 04/16/14 1013  BILITOT 0.3  < > 0.3 0.40 0.4 0.3 0.50  AST 18  < > 19 18 12 12 16   ALT 11  < > 17 10 8 10 28   ALKPHOS 89  < > 64 68 78 80 97*  PROT 6.5  < > 6.2 7.1 6.6 6.3 6.9  ALBUMIN 3.8  --  3.5  --  3.8 3.5  --   < > = values in this interval not displayed.  TUMOR MARKERS: No results for input(s): AFPTM, CEA, CA199, CHROMGRNA in the last 8760 hours.  Assessment and Plan:  My impression is that the hypermetabolic liver lesion represents either residual or recurrent breast carcinoma. I discussed the findings with the patient and her daughter. We discussed treatment options. We discussed repeating the percutaneous ablation, anticipated benefits, possible risks and side effects, and need for continued surveillance. They were motivated to proceed. I think it is appropriate to proceed with percutaneous core needle biopsy under CT guidance for confirmation as well as any needed molecular studies. We can perform concurrent percutaneous needle ablation of this region under anesthesia. We can set this up at her convenience.  Thank you for this interesting consult.  I greatly enjoyed seeing Audrea Muscat Mahjouba  back,  and look forward to participating in their care.    I spent a total of 30 minutes face to face in clinical consultation, greater than 50% of which was counseling/coordinating care for metastatic breast carcinoma.  Signed: Eldredge Veldhuizen III, DAYNE Helen Winterhalter 05/13/2014, 4:40 PM

## 2014-05-14 ENCOUNTER — Ambulatory Visit (HOSPITAL_BASED_OUTPATIENT_CLINIC_OR_DEPARTMENT_OTHER): Payer: Medicare Other

## 2014-05-14 ENCOUNTER — Encounter (HOSPITAL_COMMUNITY): Payer: Self-pay | Admitting: Orthopedic Surgery

## 2014-05-14 ENCOUNTER — Telehealth: Payer: Self-pay | Admitting: Hematology & Oncology

## 2014-05-14 ENCOUNTER — Other Ambulatory Visit (HOSPITAL_BASED_OUTPATIENT_CLINIC_OR_DEPARTMENT_OTHER): Payer: Medicare Other | Admitting: Lab

## 2014-05-14 ENCOUNTER — Ambulatory Visit (HOSPITAL_BASED_OUTPATIENT_CLINIC_OR_DEPARTMENT_OTHER): Payer: Medicare Other | Admitting: Hematology & Oncology

## 2014-05-14 VITALS — BP 86/57 | HR 86 | Resp 16 | Ht 66.0 in | Wt 248.0 lb

## 2014-05-14 DIAGNOSIS — G47429 Narcolepsy in conditions classified elsewhere without cataplexy: Secondary | ICD-10-CM

## 2014-05-14 DIAGNOSIS — C787 Secondary malignant neoplasm of liver and intrahepatic bile duct: Secondary | ICD-10-CM

## 2014-05-14 DIAGNOSIS — C50919 Malignant neoplasm of unspecified site of unspecified female breast: Secondary | ICD-10-CM

## 2014-05-14 DIAGNOSIS — C50912 Malignant neoplasm of unspecified site of left female breast: Secondary | ICD-10-CM | POA: Diagnosis not present

## 2014-05-14 DIAGNOSIS — Z5111 Encounter for antineoplastic chemotherapy: Secondary | ICD-10-CM

## 2014-05-14 DIAGNOSIS — E538 Deficiency of other specified B group vitamins: Secondary | ICD-10-CM

## 2014-05-14 DIAGNOSIS — C50911 Malignant neoplasm of unspecified site of right female breast: Secondary | ICD-10-CM

## 2014-05-14 DIAGNOSIS — D510 Vitamin B12 deficiency anemia due to intrinsic factor deficiency: Secondary | ICD-10-CM

## 2014-05-14 DIAGNOSIS — D509 Iron deficiency anemia, unspecified: Secondary | ICD-10-CM

## 2014-05-14 LAB — CBC WITH DIFFERENTIAL (CANCER CENTER ONLY)
BASO#: 0 10*3/uL (ref 0.0–0.2)
BASO%: 0.6 % (ref 0.0–2.0)
EOS%: 6.1 % (ref 0.0–7.0)
Eosinophils Absolute: 0.3 10*3/uL (ref 0.0–0.5)
HCT: 33.2 % — ABNORMAL LOW (ref 34.8–46.6)
HGB: 10.6 g/dL — ABNORMAL LOW (ref 11.6–15.9)
LYMPH#: 1.9 10*3/uL (ref 0.9–3.3)
LYMPH%: 36.9 % (ref 14.0–48.0)
MCH: 31.3 pg (ref 26.0–34.0)
MCHC: 31.9 g/dL — AB (ref 32.0–36.0)
MCV: 98 fL (ref 81–101)
MONO#: 0.4 10*3/uL (ref 0.1–0.9)
MONO%: 8.4 % (ref 0.0–13.0)
NEUT#: 2.5 10*3/uL (ref 1.5–6.5)
NEUT%: 48 % (ref 39.6–80.0)
PLATELETS: 188 10*3/uL (ref 145–400)
RBC: 3.39 10*6/uL — ABNORMAL LOW (ref 3.70–5.32)
RDW: 13.9 % (ref 11.1–15.7)
WBC: 5.1 10*3/uL (ref 3.9–10.0)

## 2014-05-14 LAB — COMPREHENSIVE METABOLIC PANEL
ALK PHOS: 88 U/L (ref 39–117)
ALT: 10 U/L (ref 0–35)
AST: 13 U/L (ref 0–37)
Albumin: 3.4 g/dL — ABNORMAL LOW (ref 3.5–5.2)
BILIRUBIN TOTAL: 0.3 mg/dL (ref 0.2–1.2)
BUN: 43 mg/dL — AB (ref 6–23)
CO2: 28 meq/L (ref 19–32)
CREATININE: 1.51 mg/dL — AB (ref 0.50–1.10)
Calcium: 8.7 mg/dL (ref 8.4–10.5)
Chloride: 105 mEq/L (ref 96–112)
Glucose, Bld: 100 mg/dL — ABNORMAL HIGH (ref 70–99)
Potassium: 3.3 mEq/L — ABNORMAL LOW (ref 3.5–5.3)
SODIUM: 143 meq/L (ref 135–145)
TOTAL PROTEIN: 6.4 g/dL (ref 6.0–8.3)

## 2014-05-14 LAB — CANCER ANTIGEN 27.29: CA 27.29: 25 U/mL (ref 0–39)

## 2014-05-14 MED ORDER — FULVESTRANT 250 MG/5ML IM SOLN
500.0000 mg | Freq: Once | INTRAMUSCULAR | Status: AC
  Administered 2014-05-14: 500 mg via INTRAMUSCULAR

## 2014-05-14 MED ORDER — CYANOCOBALAMIN 1000 MCG/ML IJ SOLN
INTRAMUSCULAR | Status: AC
  Filled 2014-05-14: qty 1

## 2014-05-14 MED ORDER — FULVESTRANT 250 MG/5ML IM SOLN
INTRAMUSCULAR | Status: AC
  Filled 2014-05-14: qty 10

## 2014-05-14 MED ORDER — PALBOCICLIB 100 MG PO CAPS: ORAL_CAPSULE | ORAL | Status: DC

## 2014-05-14 MED ORDER — METHYLPHENIDATE HCL 5 MG PO TABS
5.0000 mg | ORAL_TABLET | Freq: Two times a day (BID) | ORAL | Status: DC
Start: 2014-05-14 — End: 2014-07-18

## 2014-05-14 MED ORDER — CYANOCOBALAMIN 1000 MCG/ML IJ SOLN
1000.0000 ug | Freq: Once | INTRAMUSCULAR | Status: AC
  Administered 2014-05-14: 1000 ug via INTRAMUSCULAR

## 2014-05-14 NOTE — Patient Instructions (Signed)
Fulvestrant injection What is this medicine? FULVESTRANT (ful VES trant) blocks the effects of estrogen. It is used to treat breast cancer in women past the age of menopause. This medicine may be used for other purposes; ask your health care provider or pharmacist if you have questions. COMMON BRAND NAME(S): FASLODEX What should I tell my health care provider before I take this medicine? They need to know if you have any of these conditions: -bleeding problems -liver disease -low levels of platelets in the blood -an unusual or allergic reaction to fulvestrant, other medicines, foods, dyes, or preservatives -pregnant or trying to get pregnant -breast-feeding How should I use this medicine? This medicine is for injection into a muscle. It is usually given by a health care professional in a hospital or clinic setting. Talk to your pediatrician regarding the use of this medicine in children. Special care may be needed. Overdosage: If you think you have taken too much of this medicine contact a poison control center or emergency room at once. NOTE: This medicine is only for you. Do not share this medicine with others. What if I miss a dose? It is important not to miss your dose. Call your doctor or health care professional if you are unable to keep an appointment. What may interact with this medicine? -medicines that treat or prevent blood clots like warfarin, enoxaparin, and dalteparin This list may not describe all possible interactions. Give your health care provider a list of all the medicines, herbs, non-prescription drugs, or dietary supplements you use. Also tell them if you smoke, drink alcohol, or use illegal drugs. Some items may interact with your medicine. What should I watch for while using this medicine? Your condition will be monitored carefully while you are receiving this medicine. You will need important blood work done while you are taking this medicine. Do not become pregnant  while taking this medicine. Women should inform their doctor if they wish to become pregnant or think they might be pregnant. There is a potential for serious side effects to an unborn child. Talk to your health care professional or pharmacist for more information. What side effects may I notice from receiving this medicine? Side effects that you should report to your doctor or health care professional as soon as possible: -allergic reactions like skin rash, itching or hives, swelling of the face, lips, or tongue -feeling faint or lightheaded, falls -fever or flu-like symptoms -sore throat -vaginal bleeding Side effects that usually do not require medical attention (report to your doctor or health care professional if they continue or are bothersome): -aches, pains -constipation or diarrhea -headache -hot flashes -nausea, vomiting -pain at site where injected -stomach pain This list may not describe all possible side effects. Call your doctor for medical advice about side effects. You may report side effects to FDA at 1-800-FDA-1088. Where should I keep my medicine? This drug is given in a hospital or clinic and will not be stored at home. NOTE: This sheet is a summary. It may not cover all possible information. If you have questions about this medicine, talk to your doctor, pharmacist, or health care provider.  2015, Elsevier/Gold Standard. (2007-08-26 15:39:24) Cyanocobalamin, Vitamin B12 injection What is this medicine? CYANOCOBALAMIN (sye an oh koe BAL a min) is a man made form of vitamin B12. Vitamin B12 is used in the growth of healthy blood cells, nerve cells, and proteins in the body. It also helps with the metabolism of fats and carbohydrates. This medicine is used  to treat people who can not absorb vitamin B12. This medicine may be used for other purposes; ask your health care provider or pharmacist if you have questions. COMMON BRAND NAME(S): Cyomin, LA-12, Nutri-Twelve,  Primabalt What should I tell my health care provider before I take this medicine? They need to know if you have any of these conditions: -kidney disease -Leber's disease -megaloblastic anemia -an unusual or allergic reaction to cyanocobalamin, cobalt, other medicines, foods, dyes, or preservatives -pregnant or trying to get pregnant -breast-feeding How should I use this medicine? This medicine is injected into a muscle or deeply under the skin. It is usually given by a health care professional in a clinic or doctor's office. However, your doctor may teach you how to inject yourself. Follow all instructions. Talk to your pediatrician regarding the use of this medicine in children. Special care may be needed. Overdosage: If you think you have taken too much of this medicine contact a poison control center or emergency room at once. NOTE: This medicine is only for you. Do not share this medicine with others. What if I miss a dose? If you are given your dose at a clinic or doctor's office, call to reschedule your appointment. If you give your own injections and you miss a dose, take it as soon as you can. If it is almost time for your next dose, take only that dose. Do not take double or extra doses. What may interact with this medicine? -colchicine -heavy alcohol intake This list may not describe all possible interactions. Give your health care provider a list of all the medicines, herbs, non-prescription drugs, or dietary supplements you use. Also tell them if you smoke, drink alcohol, or use illegal drugs. Some items may interact with your medicine. What should I watch for while using this medicine? Visit your doctor or health care professional regularly. You may need blood work done while you are taking this medicine. You may need to follow a special diet. Talk to your doctor. Limit your alcohol intake and avoid smoking to get the best benefit. What side effects may I notice from receiving  this medicine? Side effects that you should report to your doctor or health care professional as soon as possible: -allergic reactions like skin rash, itching or hives, swelling of the face, lips, or tongue -blue tint to skin -chest tightness, pain -difficulty breathing, wheezing -dizziness -red, swollen painful area on the leg Side effects that usually do not require medical attention (report to your doctor or health care professional if they continue or are bothersome): -diarrhea -headache This list may not describe all possible side effects. Call your doctor for medical advice about side effects. You may report side effects to FDA at 1-800-FDA-1088. Where should I keep my medicine? Keep out of the reach of children. Store at room temperature between 15 and 30 degrees C (59 and 85 degrees F). Protect from light. Throw away any unused medicine after the expiration date. NOTE: This sheet is a summary. It may not cover all possible information. If you have questions about this medicine, talk to your doctor, pharmacist, or health care provider.  2015, Elsevier/Gold Standard. (2007-07-29 22:10:20)

## 2014-05-14 NOTE — Telephone Encounter (Signed)
[facility] [employer] C[employer] ID: [id number] Fax Number: [phone] [facility] Medicare PDP   Grievance and [address] [place], [place] [address]    [phone]

## 2014-05-14 NOTE — Progress Notes (Signed)
Hematology and Oncology Follow Up Visit  Kerri Vasquez [id number] Mar 21, 1956 59 y.o. 05/14/2014   Principle Diagnosis:  1. Metastatic breast cancer, solitary liver metastasis - recurrent 2. Gastric bypass for subsequent B12 and iron deficiency. 3. Osteoarthritis with chronic pain. 4. 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  Current Therapy:   1. Faslodex 500 mg IM monthly. 2. Vitamin B12 1 mg IM monthly. 3. IV iron as indicated.     Interim History:  Kerri Vasquez is back for follow-up. She, unfortunately, has locally recurrent disease in the liver. We did go ahead and do a PET scan on her. This was a routine PET scan. This was done on December 12. The PET scan showed activity in the liver. This was in the left hepatic lobe adjacent to where her prior lesion was.  I thought that another RFA would help. She saw interventional radiology yesterday. They agreed to do the procedure. She will be set up for this in a couple weeks.  She feels very tired. She sleeps a lot. It might be from medications. I will go ahead and try her on some Ritalin. I will try 5 mg by mouth twice a day.  She still has a lot of problems with pain from arthritis.  Her last CA 27.29 was 26.  Her appetite is okay.  She will get her B-12 today.  Her last iron studies done back in December showed a ferritin of 1600. Her iron saturation is only 14%. Her total iron was 24. As such, she probably needs another dose of iron. She does not want this today.  She's had no cough. She's had no fever. She's had no sweats. His been no change in bowel or bladder habits.  I think that she might be a good candidate for Ibrance along with the Faslodex. I talked to her about this. She and her daughter both agree. I will start her on 100 mg by mouth daily dose for 21 days on and 7 days off.  Currently, her performance status is ECOG 2 Medications:  Current outpatient prescriptions:  .  acetaminophen (TYLENOL) 500 MG  tablet, Take 1,000 mg by mouth every 6 (six) hours as needed for mild pain or moderate pain., Disp: , Rfl:  .  buPROPion (WELLBUTRIN SR) 150 MG 12 hr tablet, Take 150 mg by mouth 2 (two) times daily., Disp: , Rfl:  .  carvedilol (COREG) 12.5 MG tablet, Take 12.5 mg by mouth 2 (two) times daily., Disp: , Rfl:  .  FLUoxetine (PROZAC) 40 MG capsule, Take 40 mg by mouth daily before breakfast. , Disp: , Rfl:  .  fulvestrant (FASLODEX) 250 MG/5ML injection, Inject 250 mg into the muscle every 30 (thirty) days. One injection each buttock over 1-2 minutes. Warm prior to use., Disp: , Rfl:  .  furosemide (LASIX) 20 MG tablet, TAKE 1 TABLET BY MOUTH EVERY DAY, Disp: 90 tablet, Rfl: 0 .  levothyroxine (SYNTHROID, LEVOTHROID) 300 MCG tablet, Take 300 mcg by mouth daily before breakfast., Disp: , Rfl:  .  methocarbamol (ROBAXIN) 750 MG tablet, Take 750 mg by mouth 4 (four) times daily., Disp: , Rfl:  .  OxyCODONE (OXYCONTIN) 10 mg T12A 12 hr tablet, Take 10 mg by mouth every 12 (twelve) hours. , Disp: , Rfl:  .  oxyCODONE-acetaminophen (PERCOCET) 10-325 MG per tablet, Take 1 tablet by mouth every 6 (six) hours as needed for pain., Disp: 20 tablet, Rfl: 0 .  potassium chloride (K-DUR) 10  MEQ tablet, Take 4 tablets (40 mEq total) by mouth daily. Take 40 meq twice daily for 4 days then 40 meq once daily for 10 days, Disp: 72 tablet, Rfl: 0 .  pregabalin (LYRICA) 200 MG capsule, Take 200 mg by mouth 3 (three) times daily., Disp: , Rfl:  .  promethazine (PHENERGAN) 25 MG tablet, Take 1 tablet (25 mg total) by mouth every 8 (eight) hours as needed for nausea or vomiting., Disp: 30 tablet, Rfl: 2 .  temazepam (RESTORIL) 15 MG capsule, Take 1 capsule (15 mg total) by mouth at bedtime as needed for sleep., Disp: 30 capsule, Rfl: 3 .  tiZANidine (ZANAFLEX) 4 MG tablet, Take 4 mg by mouth every 6 (six) hours as needed for muscle spasms., Disp: , Rfl:  .  methylphenidate (RITALIN) 5 MG tablet, Take 1 tablet (5 mg total) by  mouth 2 (two) times daily., Disp: 60 tablet, Rfl: 0 No current facility-administered medications for this visit.  Facility-Administered Medications Ordered in Other Visits:  .  fulvestrant (FASLODEX) injection 500 mg, 500 mg, Intramuscular, Once, Volanda Napoleon, MD  Allergies:  Allergies  Allergen Reactions  . Influenza Vac Split [Flu Virus Vaccine] Other (See Comments)    Joint stiffness and renal failure  . Zostavax [Zoster Vaccine Live ([employer])] Other (See Comments)    Joint stiffness, renal failure  . Adhesive [Tape] Itching and Rash    Past Medical History, Surgical history, Social history, and Family History were reviewed and updated.  Review of Systems: As above  Physical Exam:  height is 5\' 6"  (1.676 m) and weight is 248 lb (112.492 kg). Her blood pressure is 86/57 and her pulse is 86. Her respiration is 16.   Obese white female in no obvious distress. Head and neck exam shows no ocular or oral lesions. There are no palpable cervical or supraclavicular lymph nodes. Lungs are clear. Cardiac exam regular rate and rhythm with no murmurs, rubs or bruits. Abdomen is soft. She is mildly obese. She has good bowel sounds. There is no fluid wave. There is no palpable liver or spleen tip. Back exam shows no tenderness over the spine, ribs or hips. Extremities shows some tenderness over the distal left forearm. She has some decreased range of motion of the left wrist. She has good pulses in her distal extremities. She has no edema in her lower legs. Skin exam shows no rashes, ecchymoses or petechia. Neurological exam is non-focal.  Lab Results  Component Value Date   WBC 5.1 05/14/2014   HGB 10.6* 05/14/2014   HCT 33.2* 05/14/2014   MCV 98 05/14/2014   PLT 188 05/14/2014     Chemistry      Component Value Date/Time   NA 138 04/16/2014 1013   NA 142 03/18/2014 0821   K 2.8* 04/16/2014 1013   K 3.7 03/18/2014 0821   CL 102 04/16/2014 1013   CL 109 03/18/2014 0821   CO2 28  04/16/2014 1013   CO2 23 03/18/2014 0821   BUN 38* 04/16/2014 1013   BUN 22 03/18/2014 0821   CREATININE 1.4* 04/16/2014 1013   CREATININE 1.21* 03/18/2014 0821      Component Value Date/Time   CALCIUM 8.8 04/16/2014 1013   CALCIUM 9.0 03/18/2014 0821   ALKPHOS 97* 04/16/2014 1013   ALKPHOS 80 03/18/2014 0821   AST 16 04/16/2014 1013   AST 12 03/18/2014 0821   ALT 28 04/16/2014 1013   ALT 10 03/18/2014 0821   BILITOT 0.50 04/16/2014 1013  BILITOT 0.3 03/18/2014 2633         Impression and Plan: [name] is 59 year old female. She has metastatic breast cancer.   It does not surprise me that the cancers recurred locally. Again there is nothing anywhere else.  I will go ahead and see if the [facility] will help along with the Faslodex.  I think that the RFA would really be a great idea. We got a "lot of mileage" out of the last procedure. I think we did this probably about 3 years ago.  We have to make sure that she stays on schedule. There've been times where she has not gotten the Faslodex for several months.  I spent about 40 minutes with she and her daughter.  We'll plan to get her back in 3-4 weeks.  Volanda Napoleon, MD 1/14/20161:47 PM

## 2014-05-15 ENCOUNTER — Encounter: Payer: Self-pay | Admitting: Nurse Practitioner

## 2014-05-15 LAB — IRON AND TIBC CHCC
%SAT: 59 % — AB (ref 21–57)
Iron: 108 ug/dL (ref 41–142)
TIBC: 184 ug/dL — ABNORMAL LOW (ref 236–444)
UIBC: 76 ug/dL — ABNORMAL LOW (ref 120–384)

## 2014-05-15 LAB — FERRITIN CHCC: Ferritin: 987 ng/ml — ABNORMAL HIGH (ref 9–269)

## 2014-05-15 NOTE — Progress Notes (Signed)
Prior auth level 1 completed and faxed back to [facility] @ [phone]. Confirmation received.

## 2014-05-16 ENCOUNTER — Ambulatory Visit (HOSPITAL_BASED_OUTPATIENT_CLINIC_OR_DEPARTMENT_OTHER)
Admission: RE | Admit: 2014-05-16 | Discharge: 2014-05-16 | Disposition: A | Discharge status: Home / Self Care | Payer: Medicare Other | Source: Ambulatory Visit | Attending: Hematology & Oncology | Admitting: Hematology & Oncology

## 2014-05-16 DIAGNOSIS — R410 Disorientation, unspecified: Secondary | ICD-10-CM | POA: Diagnosis not present

## 2014-05-16 DIAGNOSIS — C50912 Malignant neoplasm of unspecified site of left female breast: Secondary | ICD-10-CM | POA: Insufficient documentation

## 2014-05-16 MED ORDER — GADOBENATE DIMEGLUMINE 529 MG/ML IV SOLN: 10.0000 mL | Freq: Once | INTRAVENOUS | Status: AC | PRN

## 2014-05-19 ENCOUNTER — Telehealth: Payer: Self-pay | Admitting: *Deleted

## 2014-05-19 NOTE — Telephone Encounter (Addendum)
Spoke to the daughter. Happy with results.  ----- Message from Volanda Napoleon, MD sent at 05/19/2014 11:29 AM EST ----- Call her dgtr - MRI of the brain is ok.  pete

## 2014-05-26 ENCOUNTER — Encounter: Payer: Self-pay | Admitting: *Deleted

## 2014-05-27 ENCOUNTER — Other Ambulatory Visit: Payer: Self-pay | Admitting: *Deleted

## 2014-05-27 ENCOUNTER — Telehealth: Payer: Self-pay | Admitting: Hematology & Oncology

## 2014-05-27 DIAGNOSIS — C50911 Malignant neoplasm of unspecified site of right female breast: Secondary | ICD-10-CM

## 2014-05-27 MED ORDER — POTASSIUM CHLORIDE ER 10 MEQ PO TBCR: 40.0000 meq | EXTENDED_RELEASE_TABLET | Freq: Every day | ORAL | Status: DC

## 2014-05-27 NOTE — Telephone Encounter (Signed)
[facility] has APPROVED the IBRANCE 100 MG CAP  and is good until 1/252017.   P: [phone] F: [phone]          COPY SCANNED

## 2014-05-27 NOTE — Telephone Encounter (Signed)
Congratulations! Based on the information you have provided, you have qualified for PAN assistance Here is the information you will need to process a claim through your pharmacy: Patient Status:Approval for Metastatic Breast Cancer - Medicare Only  Available Balance:$12000.00  Enrollment Start Date:05/27/2014  Enrollment End Date:05/27/2015

## 2014-06-01 ENCOUNTER — Other Ambulatory Visit (HOSPITAL_COMMUNITY): Payer: Self-pay | Admitting: Interventional Radiology

## 2014-06-01 DIAGNOSIS — C787 Secondary malignant neoplasm of liver and intrahepatic bile duct: Principal | ICD-10-CM

## 2014-06-01 DIAGNOSIS — C50919 Malignant neoplasm of unspecified site of unspecified female breast: Secondary | ICD-10-CM

## 2014-06-02 ENCOUNTER — Emergency Department (HOSPITAL_BASED_OUTPATIENT_CLINIC_OR_DEPARTMENT_OTHER)
Admission: EM | Admit: 2014-06-02 | Discharge: 2014-06-02 | Disposition: A | Discharge status: Home / Self Care | Payer: Medicare Other | Attending: Emergency Medicine | Admitting: Emergency Medicine

## 2014-06-02 ENCOUNTER — Emergency Department (HOSPITAL_BASED_OUTPATIENT_CLINIC_OR_DEPARTMENT_OTHER): Payer: Medicare Other

## 2014-06-02 ENCOUNTER — Encounter (HOSPITAL_BASED_OUTPATIENT_CLINIC_OR_DEPARTMENT_OTHER): Payer: Self-pay | Admitting: *Deleted

## 2014-06-02 DIAGNOSIS — M419 Scoliosis, unspecified: Secondary | ICD-10-CM | POA: Insufficient documentation

## 2014-06-02 DIAGNOSIS — E039 Hypothyroidism, unspecified: Secondary | ICD-10-CM | POA: Diagnosis not present

## 2014-06-02 DIAGNOSIS — S92401A Displaced unspecified fracture of right great toe, initial encounter for closed fracture: Secondary | ICD-10-CM

## 2014-06-02 DIAGNOSIS — M797 Fibromyalgia: Secondary | ICD-10-CM | POA: Insufficient documentation

## 2014-06-02 DIAGNOSIS — I10 Essential (primary) hypertension: Secondary | ICD-10-CM | POA: Insufficient documentation

## 2014-06-02 DIAGNOSIS — Y9289 Other specified places as the place of occurrence of the external cause: Secondary | ICD-10-CM | POA: Diagnosis not present

## 2014-06-02 DIAGNOSIS — G43909 Migraine, unspecified, not intractable, without status migrainosus: Secondary | ICD-10-CM | POA: Diagnosis not present

## 2014-06-02 DIAGNOSIS — N644 Mastodynia: Secondary | ICD-10-CM | POA: Diagnosis not present

## 2014-06-02 DIAGNOSIS — Y998 Other external cause status: Secondary | ICD-10-CM | POA: Insufficient documentation

## 2014-06-02 DIAGNOSIS — S92411A Displaced fracture of proximal phalanx of right great toe, initial encounter for closed fracture: Secondary | ICD-10-CM | POA: Insufficient documentation

## 2014-06-02 DIAGNOSIS — M84477A Pathological fracture, right toe(s), initial encounter for fracture: Secondary | ICD-10-CM | POA: Diagnosis not present

## 2014-06-02 DIAGNOSIS — M25532 Pain in left wrist: Secondary | ICD-10-CM | POA: Diagnosis not present

## 2014-06-02 DIAGNOSIS — T1490XA Injury, unspecified, initial encounter: Secondary | ICD-10-CM

## 2014-06-02 DIAGNOSIS — Z8719 Personal history of other diseases of the digestive system: Secondary | ICD-10-CM | POA: Diagnosis not present

## 2014-06-02 DIAGNOSIS — E119 Type 2 diabetes mellitus without complications: Secondary | ICD-10-CM | POA: Diagnosis not present

## 2014-06-02 DIAGNOSIS — Z8669 Personal history of other diseases of the nervous system and sense organs: Secondary | ICD-10-CM | POA: Diagnosis not present

## 2014-06-02 DIAGNOSIS — S99921A Unspecified injury of right foot, initial encounter: Secondary | ICD-10-CM | POA: Diagnosis present

## 2014-06-02 DIAGNOSIS — C787 Secondary malignant neoplasm of liver and intrahepatic bile duct: Secondary | ICD-10-CM | POA: Insufficient documentation

## 2014-06-02 DIAGNOSIS — G603 Idiopathic progressive neuropathy: Secondary | ICD-10-CM | POA: Diagnosis not present

## 2014-06-02 DIAGNOSIS — M84474A Pathological fracture, right foot, initial encounter for fracture: Secondary | ICD-10-CM | POA: Diagnosis not present

## 2014-06-02 DIAGNOSIS — M159 Polyosteoarthritis, unspecified: Secondary | ICD-10-CM | POA: Diagnosis not present

## 2014-06-02 DIAGNOSIS — Z8742 Personal history of other diseases of the female genital tract: Secondary | ICD-10-CM | POA: Diagnosis not present

## 2014-06-02 DIAGNOSIS — Z853 Personal history of malignant neoplasm of breast: Secondary | ICD-10-CM

## 2014-06-02 DIAGNOSIS — Z79899 Other long term (current) drug therapy: Secondary | ICD-10-CM | POA: Insufficient documentation

## 2014-06-02 DIAGNOSIS — W228XXA Striking against or struck by other objects, initial encounter: Secondary | ICD-10-CM | POA: Diagnosis not present

## 2014-06-02 DIAGNOSIS — F329 Major depressive disorder, single episode, unspecified: Secondary | ICD-10-CM | POA: Insufficient documentation

## 2014-06-02 DIAGNOSIS — Y9389 Activity, other specified: Secondary | ICD-10-CM | POA: Diagnosis not present

## 2014-06-02 DIAGNOSIS — Z862 Personal history of diseases of the blood and blood-forming organs and certain disorders involving the immune mechanism: Secondary | ICD-10-CM | POA: Diagnosis not present

## 2014-06-02 DIAGNOSIS — M4726 Other spondylosis with radiculopathy, lumbar region: Secondary | ICD-10-CM | POA: Diagnosis not present

## 2014-06-02 NOTE — ED Notes (Signed)
Verbal order from Dr. Stark Jock.

## 2014-06-02 NOTE — ED Notes (Addendum)
Pain in her right great toe and 2nd toe. States she hit it against a dresser 5 days ago.

## 2014-06-02 NOTE — ED Notes (Signed)
Patient transported to & from xray.

## 2014-06-02 NOTE — Discharge Instructions (Signed)
Wear cam walker as applied.  Follow-up with Dr. Marin Olp as scheduled.   Toe Fracture Your caregiver has diagnosed you as having a fractured toe. A toe fracture is a break in the bone of a toe. "Buddy taping" is a way of splinting your broken toe, by taping the broken toe to the toe next to it. This "buddy taping" will keep the injured toe from moving beyond normal range of motion. Buddy taping also helps the toe heal in a more normal alignment. It may take 6 to 8 weeks for the toe injury to heal. [place] your toes taped together for as long as directed by your caregiver or until you see a doctor for a follow-up examination. You can change the tape after bathing. Always use a small piece of gauze or cotton between the toes when taping them together. This will help the skin stay dry and prevent infection.  Apply ice to the injury for 15-20 minutes each hour while awake for the first 2 days. Put the ice in a plastic bag and place a towel between the bag of ice and your skin.  After the first 2 days, apply heat to the injured area. Use heat for the next 2 to 3 days. Place a heating pad on the foot or soak the foot in warm water as directed by your caregiver.  Keep your foot elevated as much as possible to lessen swelling.  Wear sturdy, supportive shoes. The shoes should not pinch the toes or fit tightly against the toes.  Your caregiver may prescribe a rigid shoe if your foot is very swollen.  Your may be given crutches if the pain is too great and it hurts too much to walk.  Only take over-the-counter or prescription medicines for pain, discomfort, or fever as directed by your caregiver.  If your caregiver has given you a follow-up appointment, it is very important to keep that appointment. Not keeping the appointment could result in a chronic or permanent injury, pain, and disability. If there is any problem keeping the appointment, you must call back to this facility  for assistance. SEEK MEDICAL CARE IF:   You have increased pain or swelling, not relieved with medications.  The pain does not get better after 1 week.  Your injured toe is cold when the others are warm. SEEK IMMEDIATE MEDICAL CARE IF:   The toe becomes cold, numb, or white.  The toe becomes hot (inflamed) and red. Document Released: 04/14/2000 Document Revised: 07/10/2011 Document Reviewed: 12/02/2007 [facility] Patient Information 2015 [place], [place]. This information is not intended to replace advice given to you by your health care provider. Make sure you discuss any questions you have with your health care provider.

## 2014-06-02 NOTE — ED Provider Notes (Signed)
CSN: [id number]     Arrival date & time 06/02/14  1124 History   First MD Initiated Contact with Patient 06/02/14 1225     Chief Complaint  Patient presents with  . Toe Pain     (Consider location/radiation/quality/duration/timing/severity/associated sxs/prior Treatment) HPI Comments: Patient is a 59 year old female with history of breast cancer. She presents with complaints of pain in her right great toe after striking it against a dresser several days ago.  Patient is a 59 y.o. female presenting with toe pain. The history is provided by the patient.  Toe Pain This is a new problem. The current episode started yesterday. The problem occurs constantly. The problem has not changed since onset.The symptoms are aggravated by walking. Nothing relieves the symptoms. She has tried nothing for the symptoms. The treatment provided no relief.    Past Medical History  Diagnosis Date  . Depression   . Hypertension   . Migraine   . Hypothyroidism   . Incisional hernia   . Spinal stenosis   . Scoliosis   . Sciatica   . Intestinal obstruction   . GERD (gastroesophageal reflux disease)   . Septic arthritis 01/28/10    and spinal stenosis , moderate scoliosis   . Osteoarthritis of multiple joints   . Anemia   . Pernicious anemia 05/30/2011    Iron transfusion and VB12 on 06/06/11  . Trouble swallowing   . Leg swelling     right   . Abdominal pain   . Constipation   . Nausea & vomiting   . Sleep apnea     hx of off machine x 4 years   . Diabetes mellitus     type II, controlled by diet 05/25/11   . 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 Renal insufficiency     hx renal failure 2011  . Fibromyalgia    Past Surgical History  Procedure Laterality Date  . Tonsillectomy  1969  . Cystectomy  2009    left breast  . Bariatric surgery  2006  . Other surgical history      surgery for intestinal blockage 2011   . Multiple extractions with alveoloplasty  06/08/2011   Procedure: MULTIPLE EXTRACION WITH ALVEOLOPLASTY;  Surgeon: Lenn Cal, DDS;  Location: [facility];  Service: Oral Surgery;  Laterality: N/A;  Extraction of tooth #'s 3,4,7 with alveoloplasty and Gross Debridement of Remaining Teeth  . Gastric bypass  2006  . Intestinal blockage  2011    surgery for this   . Breast surgery  2008     bilateral mastectomy  . Breast reconstruction  2008, 2009  . Cholecystectomy  06/29/85  . Radio frequency ablation to liver  sept 27, 2013  . Thyroid lobectomy  02/16/2012    Procedure: THYROID LOBECTOMY;  Surgeon: Earnstine Regal, MD;  Location: [facility];  Service: General;  Laterality: Right;   Family History  Problem Relation Age of Onset  . Cancer Mother     breast  . Hypertension Mother   . Anesthesia problems Daughter    History  Substance Use Topics  . Smoking status: Never Smoker   . Smokeless tobacco: Never Used     Comment: never used tobacco  . Alcohol Use: No   OB History    No data available     Review of Systems  All other systems reviewed and are negative.     Allergies  Influenza vac split; Zostavax;  and Adhesive  Home Medications   Prior to Admission medications   Medication Sig Start Date End Date Taking? Authorizing Provider  acetaminophen (TYLENOL) 500 MG tablet Take 1,000 mg by mouth every 6 (six) hours as needed for mild pain or moderate pain.    Historical Provider, MD  buPROPion (WELLBUTRIN SR) 150 MG 12 hr tablet Take 150 mg by mouth 2 (two) times daily.    Historical Provider, MD  carvedilol (COREG) 12.5 MG tablet Take 12.5 mg by mouth 2 (two) times daily. 01/23/12   Volanda Napoleon, MD  FLUoxetine (PROZAC) 40 MG capsule Take 40 mg by mouth daily before breakfast.     Historical Provider, MD  fulvestrant (FASLODEX) 250 MG/5ML injection Inject 250 mg into the muscle every 30 (thirty) days. One injection each buttock over 1-2 minutes. Warm prior to use.    Historical Provider, MD  furosemide (LASIX) 20 MG tablet TAKE 1  TABLET BY MOUTH EVERY DAY 02/04/14   Volanda Napoleon, MD  levothyroxine (SYNTHROID, LEVOTHROID) 300 MCG tablet Take 300 mcg by mouth daily before breakfast.    Historical Provider, MD  methocarbamol (ROBAXIN) 750 MG tablet Take 750 mg by mouth 4 (four) times daily.    Historical Provider, MD  methylphenidate (RITALIN) 5 MG tablet Take 1 tablet (5 mg total) by mouth 2 (two) times daily. 05/14/14   Volanda Napoleon, MD  OxyCODONE (OXYCONTIN) 10 mg T12A 12 hr tablet Take 10 mg by mouth every 12 (twelve) hours.     Historical Provider, MD  oxyCODONE-acetaminophen (PERCOCET) 10-325 MG per tablet Take 1 tablet by mouth every 6 (six) hours as needed for pain. 01/05/14   Virgel Manifold, MD  palbociclib Leslee Home) 100 MG capsule Take 1 pill a day for 21 days on and 7 days off.  Take with food 05/14/14   Volanda Napoleon, MD  potassium chloride (K-DUR) 10 MEQ tablet Take 4 tablets (40 mEq total) by mouth daily. 05/27/14   Volanda Napoleon, MD  pregabalin (LYRICA) 200 MG capsule Take 200 mg by mouth 3 (three) times daily.    Historical Provider, MD  promethazine (PHENERGAN) 25 MG tablet Take 1 tablet (25 mg total) by mouth every 8 (eight) hours as needed for nausea or vomiting. 11/25/13   Volanda Napoleon, MD  temazepam (RESTORIL) 15 MG capsule Take 1 capsule (15 mg total) by mouth at bedtime as needed for sleep. 11/25/13   Volanda Napoleon, MD  tiZANidine (ZANAFLEX) 4 MG tablet Take 4 mg by mouth every 6 (six) hours as needed for muscle spasms.    Historical Provider, MD   BP 149/74 mmHg  Pulse 73  Temp(Src) 97.3 F (36.3 C) (Oral)  Resp 20  Ht 5\' 6"  (1.676 m)  Wt 248 lb (112.492 kg)  BMI 40.05 kg/m2  SpO2 100%  LMP 08/30/2006 Physical Exam  Constitutional: She is oriented to person, place, and time. She appears well-developed and well-nourished. No distress.  HENT:  Head: Normocephalic and atraumatic.  Neck: Normal range of motion. Neck supple.  Musculoskeletal: Normal range of motion.  There is swelling and  tenderness to the mid great toe.  Neurological: She is alert and oriented to person, place, and time.  Skin: Skin is warm and dry. She is not diaphoretic.  Nursing note and vitals reviewed.   ED Course  Procedures (including critical care time) Labs Review Labs Reviewed - No data to display  Imaging Review Dg Foot Complete Right  06/02/2014   CLINICAL  DATA:  Blunt trauma with right great toe and second toe pain.  EXAM: RIGHT FOOT COMPLETE - 3+ VIEW  COMPARISON:  None.  FINDINGS: Acute fracture through the distal aspect of the first proximal phalanx, with rotation of the fragment resulting in articular surface incongruity. The articular component is approximately 6 mm. There is an underlying lesion which is juxtaarticular, 1 cm in diameter. Although this lesion has somewhat ill-defined margins, there is a larger, circumscribed lucency in the first proximal phalanx which has a benign sclerotic rim. 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 Noted history of breast cancer, but there is no indication of bone metastases on PET-CT 04/09/2014.  Non acute findings include osteopenia and remote, healed posttraumatic deformity of the second and third metatarsals.  IMPRESSION: Pathologic, intra-articular fracture of the first proximal phalanx. An underlying erosive arthropathy could have this appearance, but given history of breast cancer follow up imaging is recommended if surgical therapy is not planned.   Electronically Signed   By: Jorje Guild M.D.   On: 06/02/2014 11:58     EKG Interpretation None      MDM   Final diagnoses:  Blunt trauma    X-rays reveal what may possibly be a pathologic fracture of the proximal phalanx. I've discussed this finding with Dr. Marin Olp from oncology. The patient recently had a PET scan that did not show any increased metabolic activity in this area and feels as though the findings related  to this fracture are likely related to her rheumatoid arthritis. She will be discharged to home in a [employer] with instructions for when necessary return.    Veryl Speak, MD 06/02/14 [phone]

## 2014-06-02 NOTE — ED Notes (Signed)
MD at bedside. 

## 2014-06-03 ENCOUNTER — Other Ambulatory Visit: Payer: Self-pay | Admitting: Hematology & Oncology

## 2014-06-05 ENCOUNTER — Other Ambulatory Visit: Payer: Self-pay | Admitting: Radiology

## 2014-06-09 NOTE — Patient Instructions (Signed)
Kerri Vasquez  06/09/2014   Your procedure is scheduled on: 06/17/14   Report to [facility] Main  Entrance and follow signs to               [place] at 6:00 AM.   Call this number if you have problems the morning of surgery [phone]   Remember:  Do not eat food or drink liquids :After Midnight.     Take these medicines the morning of surgery with A SIP OF WATER:                                You may not have any metal on your body including hair pins and              piercings  Do not wear jewelry, make-up, lotions, powders or perfumes.             Do not wear nail polish.  Do not shave  48 hours prior to surgery.              Men may shave face and neck.   Do not bring valuables to the hospital. [place].  Contacts, dentures or bridgework may not be worn into surgery.  Leave suitcase in the car. After surgery it may be brought to your room.     Patients discharged the day of surgery will not be allowed to drive home.  Name and phone number of your driver:  Special Instructions: N/A              Please read over the following fact sheets you were given: _____________________________________________________________________                                                     [place]  Before surgery, you can play an important role.  Because skin is not sterile, your skin needs to be as free of germs as possible.  You can reduce the number of germs on your skin by washing with CHG (chlorahexidine gluconate) soap before surgery.  CHG is an antiseptic cleaner which kills germs and bonds with the skin to continue killing germs even after washing. Please DO NOT use if you have an allergy to CHG or antibacterial soaps.  If your skin becomes reddened/irritated stop using the CHG and inform your nurse when you arrive at Short Stay. Do not shave (including legs and  underarms) for at least 48 hours prior to the first CHG shower.  You may shave your face. Please follow these instructions carefully:   1.  Shower with CHG Soap the night before surgery and the  morning of Surgery.   2.  If you choose to wash your hair, wash your hair first as usual with your  normal  Shampoo.   3.  After you shampoo, rinse your hair and body thoroughly to remove the  shampoo.  4.  Use CHG as you would any other liquid soap.  You can apply chg directly  to the skin and wash . Gently wash with scrungie or clean wascloth    5.  Apply the CHG Soap to your body ONLY FROM THE NECK DOWN.   Do not use on open                           Wound or open sores. Avoid contact with eyes, ears mouth and genitals (private parts).                        Genitals (private parts) with your normal soap.              6.  Wash thoroughly, paying special attention to the area where your surgery  will be performed.   7.  Thoroughly rinse your body with warm water from the neck down.   8.  DO NOT shower/wash with your normal soap after using and rinsing off  the CHG Soap .                9.  Pat yourself dry with a clean towel.             10.  Wear clean pajamas.             11.  Place clean sheets on your bed the night of your first shower and do not  sleep with pets.  Day of Surgery : Do not apply any lotions/deodorants the morning of surgery.  Please wear clean clothes to the hospital/surgery center.  FAILURE TO FOLLOW THESE INSTRUCTIONS MAY RESULT IN THE CANCELLATION OF YOUR SURGERY    PATIENT SIGNATURE_________________________________  ______________________________________________________________________

## 2014-06-10 ENCOUNTER — Inpatient Hospital Stay (HOSPITAL_COMMUNITY)
Admission: RE | Admit: 2014-06-10 | Discharge: 2014-06-10 | Disposition: A | Discharge status: Home / Self Care | Payer: Self-pay | Source: Ambulatory Visit

## 2014-06-10 ENCOUNTER — Other Ambulatory Visit: Payer: Self-pay | Admitting: Hematology & Oncology

## 2014-06-10 ENCOUNTER — Other Ambulatory Visit (HOSPITAL_COMMUNITY): Payer: Self-pay | Admitting: *Deleted

## 2014-06-10 NOTE — Patient Instructions (Signed)
[facility]ru [facility]  06/10/2014   Your procedure is scheduled on: Wednesday February 17th, 2016  Report to [facility] Main  Entrance and follow signs to               Radiology at 600 AM.  Call this number if you have problems the morning of surgery [phone]   Remember:  Do not eat food or drink liquids :After Midnight.     Take these medicines the morning of surgery with A SIP OF WATER:                                You may not have any metal on your body including hair pins and              piercings  Do not wear jewelry, make-up, lotions, powders or perfumes.             Do not wear nail polish.  Do not shave  48 hours prior to surgery.              Men may shave face and neck.   Do not bring valuables to the hospital. [place].  Contacts, dentures or bridgework may not be worn into surgery.  Leave suitcase in the car. After surgery it may be brought to your room.     Patients discharged the day of surgery will not be allowed to drive home.  Name and phone number of your driver:  Special Instructions: N/A              Please read over the following fact sheets you were given: _____________________________________________________________________             [facility] - Preparing for Surgery Before surgery, you can play an important role.  Because skin is not sterile, your skin needs to be as free of germs as possible.  You can reduce the number of germs on your skin by washing with CHG (chlorahexidine gluconate) soap before surgery.  CHG is an antiseptic cleaner which kills germs and bonds with the skin to continue killing germs even after washing. Please DO NOT use if you have an allergy to CHG or antibacterial soaps.  If your skin becomes reddened/irritated stop using the CHG and inform your nurse when you arrive at Short Stay. Do not shave (including legs and underarms) for at least 48 hours  prior to the first CHG shower.  You may shave your face/neck. Please follow these instructions carefully:  1.  Shower with CHG Soap the night before surgery and the  morning of Surgery.  2.  If you choose to wash your hair, wash your hair first as usual with your  normal  shampoo.  3.  After you shampoo, rinse your hair and body thoroughly to remove the  shampoo.                           4.  Use CHG as you would any other liquid soap.  You can apply chg directly  to the skin and wash                       Gently  with a scrungie or clean washcloth.  5.  Apply the CHG Soap to your body ONLY FROM THE NECK DOWN.   Do not use on face/ open                           Wound or open sores. Avoid contact with eyes, ears mouth and genitals (private parts).                       Wash face,  Genitals (private parts) with your normal soap.             6.  Wash thoroughly, paying special attention to the area where your surgery  will be performed.  7.  Thoroughly rinse your body with warm water from the neck down.  8.  DO NOT shower/wash with your normal soap after using and rinsing off  the CHG Soap.                9.  Pat yourself dry with a clean towel.            10.  Wear clean pajamas.            11.  Place clean sheets on your bed the night of your first shower and do not  sleep with pets. Day of Surgery : Do not apply any lotions/deodorants the morning of surgery.  Please wear clean clothes to the hospital/surgery center.  FAILURE TO FOLLOW THESE INSTRUCTIONS MAY RESULT IN THE CANCELLATION OF YOUR SURGERY PATIENT SIGNATURE_________________________________  NURSE SIGNATURE__________________________________  ________________________________________________________________________

## 2014-06-11 ENCOUNTER — Ambulatory Visit: Payer: Self-pay | Admitting: Hematology & Oncology

## 2014-06-11 ENCOUNTER — Other Ambulatory Visit: Payer: Medicare Other | Admitting: Lab

## 2014-06-11 ENCOUNTER — Telehealth: Payer: Self-pay | Admitting: Hematology & Oncology

## 2014-06-11 ENCOUNTER — Ambulatory Visit: Payer: Self-pay

## 2014-06-11 NOTE — Telephone Encounter (Signed)
Pt cx 2-11 moved to 2-29 she fell and broke her foot

## 2014-06-15 ENCOUNTER — Inpatient Hospital Stay (HOSPITAL_COMMUNITY)
Admission: RE | Admit: 2014-06-15 | Discharge: 2014-06-15 | Disposition: A | Discharge status: Home / Self Care | Payer: Self-pay | Source: Ambulatory Visit

## 2014-06-15 ENCOUNTER — Other Ambulatory Visit: Payer: Self-pay | Admitting: Radiology

## 2014-06-16 ENCOUNTER — Inpatient Hospital Stay (HOSPITAL_COMMUNITY)
Admission: RE | Admit: 2014-06-16 | Discharge: 2014-06-16 | Disposition: A | Discharge status: Home / Self Care | Payer: Self-pay | Source: Ambulatory Visit

## 2014-06-16 NOTE — Patient Instructions (Signed)
Kerri Vasquez  06/16/2014   Your procedure is scheduled on: 06/17/14   Report to [facility] Main  Entrance and follow signs to               [place] at 6:00 AM.   Call this number if you have problems the morning of surgery [phone]   Remember:  Do not eat food or drink liquids :After Midnight.     Take these medicines the morning of surgery with A SIP OF WATER:                                You may not have any metal on your body including hair pins and              piercings  Do not wear jewelry, make-up, lotions, powders or perfumes.             Do not wear nail polish.  Do not shave  48 hours prior to surgery.              Men may shave face and neck.   Do not bring valuables to the hospital. [place].  Contacts, dentures or bridgework may not be worn into surgery.  Leave suitcase in the car. After surgery it may be brought to your room.     Patients discharged the day of surgery will not be allowed to drive home.  Name and phone number of your driver:  Special Instructions: N/A              Please read over the following fact sheets you were given: _____________________________________________________________________                                                     [place]  Before surgery, you can play an important role.  Because skin is not sterile, your skin needs to be as free of germs as possible.  You can reduce the number of germs on your skin by washing with CHG (chlorahexidine gluconate) soap before surgery.  CHG is an antiseptic cleaner which kills germs and bonds with the skin to continue killing germs even after washing. Please DO NOT use if you have an allergy to CHG or antibacterial soaps.  If your skin becomes reddened/irritated stop using the CHG and inform your nurse when you arrive at Short Stay. Do not shave (including legs and  underarms) for at least 48 hours prior to the first CHG shower.  You may shave your face. Please follow these instructions carefully:   1.  Shower with CHG Soap the night before surgery and the  morning of Surgery.   2.  If you choose to wash your hair, wash your hair first as usual with your  normal  Shampoo.   3.  After you shampoo, rinse your hair and body thoroughly to remove the  shampoo.  4.  Use CHG as you would any other liquid soap.  You can apply chg directly  to the skin and wash . Gently wash with scrungie or clean wascloth    5.  Apply the CHG Soap to your body ONLY FROM THE NECK DOWN.   Do not use on open                           Wound or open sores. Avoid contact with eyes, ears mouth and genitals (private parts).                        Genitals (private parts) with your normal soap.              6.  Wash thoroughly, paying special attention to the area where your surgery  will be performed.   7.  Thoroughly rinse your body with warm water from the neck down.   8.  DO NOT shower/wash with your normal soap after using and rinsing off  the CHG Soap .                9.  Pat yourself dry with a clean towel.             10.  Wear clean pajamas.             11.  Place clean sheets on your bed the night of your first shower and do not  sleep with pets.  Day of Surgery : Do not apply any lotions/deodorants the morning of surgery.  Please wear clean clothes to the hospital/surgery center.  FAILURE TO FOLLOW THESE INSTRUCTIONS MAY RESULT IN THE CANCELLATION OF YOUR SURGERY    PATIENT SIGNATURE_________________________________  ______________________________________________________________________

## 2014-06-17 ENCOUNTER — Ambulatory Visit (HOSPITAL_COMMUNITY)
Admission: RE | Admit: 2014-06-17 | Payer: Medicare Other | Source: Ambulatory Visit | Admitting: Interventional Radiology

## 2014-06-17 ENCOUNTER — Encounter (HOSPITAL_COMMUNITY): Admission: RE | Payer: Self-pay | Source: Ambulatory Visit

## 2014-06-17 ENCOUNTER — Ambulatory Visit (HOSPITAL_COMMUNITY): Payer: Medicare Other

## 2014-06-17 SURGERY — RADIO FREQUENCY ABLATION
Anesthesia: General

## 2014-06-29 ENCOUNTER — Ambulatory Visit (HOSPITAL_BASED_OUTPATIENT_CLINIC_OR_DEPARTMENT_OTHER): Payer: Medicare Other

## 2014-06-29 ENCOUNTER — Ambulatory Visit (HOSPITAL_BASED_OUTPATIENT_CLINIC_OR_DEPARTMENT_OTHER): Payer: Medicare Other | Admitting: Hematology & Oncology

## 2014-06-29 ENCOUNTER — Other Ambulatory Visit (HOSPITAL_BASED_OUTPATIENT_CLINIC_OR_DEPARTMENT_OTHER): Payer: Medicare Other | Admitting: Lab

## 2014-06-29 ENCOUNTER — Encounter: Payer: Self-pay | Admitting: Hematology & Oncology

## 2014-06-29 VITALS — BP 102/45 | HR 71 | Temp 97.4°F | Resp 16 | Ht 66.0 in | Wt 243.0 lb

## 2014-06-29 DIAGNOSIS — C787 Secondary malignant neoplasm of liver and intrahepatic bile duct: Secondary | ICD-10-CM

## 2014-06-29 DIAGNOSIS — C50919 Malignant neoplasm of unspecified site of unspecified female breast: Secondary | ICD-10-CM | POA: Diagnosis not present

## 2014-06-29 DIAGNOSIS — M1990 Unspecified osteoarthritis, unspecified site: Secondary | ICD-10-CM

## 2014-06-29 DIAGNOSIS — D509 Iron deficiency anemia, unspecified: Secondary | ICD-10-CM

## 2014-06-29 DIAGNOSIS — D510 Vitamin B12 deficiency anemia due to intrinsic factor deficiency: Secondary | ICD-10-CM | POA: Diagnosis not present

## 2014-06-29 DIAGNOSIS — D500 Iron deficiency anemia secondary to blood loss (chronic): Secondary | ICD-10-CM

## 2014-06-29 DIAGNOSIS — G47429 Narcolepsy in conditions classified elsewhere without cataplexy: Secondary | ICD-10-CM

## 2014-06-29 DIAGNOSIS — C50911 Malignant neoplasm of unspecified site of right female breast: Secondary | ICD-10-CM

## 2014-06-29 DIAGNOSIS — Z5111 Encounter for antineoplastic chemotherapy: Secondary | ICD-10-CM | POA: Diagnosis not present

## 2014-06-29 DIAGNOSIS — C50912 Malignant neoplasm of unspecified site of left female breast: Secondary | ICD-10-CM

## 2014-06-29 LAB — CMP (CANCER CENTER ONLY)
ALBUMIN: 3.3 g/dL (ref 3.3–5.5)
ALT: 31 U/L (ref 10–47)
AST: 27 U/L (ref 11–38)
Alkaline Phosphatase: 101 U/L — ABNORMAL HIGH (ref 26–84)
BUN, Bld: 30 mg/dL — ABNORMAL HIGH (ref 7–22)
CHLORIDE: 101 meq/L (ref 98–108)
CO2: 28 mEq/L (ref 18–33)
CREATININE: 1.1 mg/dL (ref 0.6–1.2)
Calcium: 9.5 mg/dL (ref 8.0–10.3)
Glucose, Bld: 80 mg/dL (ref 73–118)
POTASSIUM: 3.6 meq/L (ref 3.3–4.7)
Sodium: 144 mEq/L (ref 128–145)
Total Bilirubin: 0.7 mg/dl (ref 0.20–1.60)
Total Protein: 7.2 g/dL (ref 6.4–8.1)

## 2014-06-29 LAB — CBC WITH DIFFERENTIAL (CANCER CENTER ONLY)
BASO#: 0 10*3/uL (ref 0.0–0.2)
BASO%: 0.3 % (ref 0.0–2.0)
EOS%: 1.4 % (ref 0.0–7.0)
Eosinophils Absolute: 0.1 10*3/uL (ref 0.0–0.5)
HEMATOCRIT: 31.6 % — AB (ref 34.8–46.6)
HGB: 10.4 g/dL — ABNORMAL LOW (ref 11.6–15.9)
LYMPH#: 1.8 10*3/uL (ref 0.9–3.3)
LYMPH%: 51.5 % — AB (ref 14.0–48.0)
MCH: 32.4 pg (ref 26.0–34.0)
MCHC: 32.9 g/dL (ref 32.0–36.0)
MCV: 98 fL (ref 81–101)
MONO#: 0.4 10*3/uL (ref 0.1–0.9)
MONO%: 10.9 % (ref 0.0–13.0)
NEUT#: 1.3 10*3/uL — ABNORMAL LOW (ref 1.5–6.5)
NEUT%: 35.9 % — AB (ref 39.6–80.0)
Platelets: 145 10*3/uL (ref 145–400)
RBC: 3.21 10*6/uL — ABNORMAL LOW (ref 3.70–5.32)
RDW: 13.2 % (ref 11.1–15.7)
WBC: 3.6 10*3/uL — ABNORMAL LOW (ref 3.9–10.0)

## 2014-06-29 LAB — IRON AND TIBC CHCC
%SAT: 51 % (ref 21–57)
Iron: 111 ug/dL (ref 41–142)
TIBC: 219 ug/dL — AB (ref 236–444)
UIBC: 108 ug/dL — ABNORMAL LOW (ref 120–384)

## 2014-06-29 LAB — RETICULOCYTES (CHCC)
ABS Retic: 32.3 10*3/uL (ref 19.0–186.0)
RBC.: 3.23 MIL/uL — ABNORMAL LOW (ref 3.87–5.11)
RETIC CT PCT: 1 % (ref 0.4–2.3)

## 2014-06-29 LAB — CANCER ANTIGEN 27.29: CA 27.29: 37 U/mL (ref 0–39)

## 2014-06-29 LAB — CHCC SATELLITE - SMEAR

## 2014-06-29 LAB — FERRITIN CHCC: Ferritin: 1641 ng/ml — ABNORMAL HIGH (ref 9–269)

## 2014-06-29 MED ORDER — FULVESTRANT 250 MG/5ML IM SOLN
INTRAMUSCULAR | Status: AC
  Filled 2014-06-29: qty 10

## 2014-06-29 MED ORDER — SODIUM CHLORIDE 0.9 % IV SOLN
510.0000 mg | Freq: Once | INTRAVENOUS | Status: AC
  Administered 2014-06-29: 510 mg via INTRAVENOUS
  Filled 2014-06-29: qty 17

## 2014-06-29 MED ORDER — HYDROMORPHONE HCL 1 MG/ML IJ SOLN
INTRAMUSCULAR | Status: AC
  Filled 2014-06-29: qty 1

## 2014-06-29 MED ORDER — CYANOCOBALAMIN 1000 MCG/ML IJ SOLN
1000.0000 ug | Freq: Once | INTRAMUSCULAR | Status: AC
  Administered 2014-06-29: 1000 ug via INTRAMUSCULAR

## 2014-06-29 MED ORDER — HYDROMORPHONE HCL PF 1 MG/ML IJ SOLN
1.0000 mg | Freq: Once | INTRAMUSCULAR | Status: AC
  Administered 2014-06-29: 1 mg via INTRAVENOUS
  Filled 2014-06-29: qty 1

## 2014-06-29 MED ORDER — CYANOCOBALAMIN 1000 MCG/ML IJ SOLN
INTRAMUSCULAR | Status: AC
  Filled 2014-06-29: qty 1

## 2014-06-29 MED ORDER — ZOLEDRONIC ACID 4 MG/100ML IV SOLN
4.0000 mg | Freq: Once | INTRAVENOUS | Status: AC
  Administered 2014-06-29: 4 mg via INTRAVENOUS
  Filled 2014-06-29: qty 100

## 2014-06-29 MED ORDER — FULVESTRANT 250 MG/5ML IM SOLN
500.0000 mg | Freq: Once | INTRAMUSCULAR | Status: AC
  Administered 2014-06-29: 500 mg via INTRAMUSCULAR

## 2014-06-29 NOTE — Patient Instructions (Signed)
[facility] Discharge Instructions for Patients Today you received the following chemotherapy agents Faslodex, Feraheme, B12, and Zometa  To help prevent nausea and vomiting after your treatment, we encourage you to take your nausea medication as prescribed.    If you develop nausea and vomiting that is not controlled by your nausea medication, call the clinic.   BELOW ARE SYMPTOMS THAT SHOULD BE REPORTED IMMEDIATELY:  *FEVER GREATER THAN 100.5 F  *CHILLS WITH OR WITHOUT FEVER  NAUSEA AND VOMITING THAT IS NOT CONTROLLED WITH YOUR NAUSEA MEDICATION  *UNUSUAL SHORTNESS OF BREATH  *UNUSUAL BRUISING OR BLEEDING  TENDERNESS IN MOUTH AND THROAT WITH OR WITHOUT PRESENCE OF ULCERS  *URINARY PROBLEMS  *BOWEL PROBLEMS  UNUSUAL RASH Items with * indicate a potential emergency and should be followed up as soon as possible.  Feel free to call the clinic you have any questions or concerns. The clinic phone number is [phone].

## 2014-06-29 NOTE — Progress Notes (Signed)
Hematology and Oncology Follow Up Visit  Kerri Vasquez [id number] [birth year] 59 y.o. 06/29/2014   Principle Diagnosis:  1. Metastatic breast cancer, solitary liver metastasis - recurrent 2. Gastric bypass for subsequent B12 and iron deficiency. 3. Osteoarthritis with chronic pain. 4. 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  Current Therapy:   1. Faslodex 500 mg IM monthly. 2. Leslee Home 100mg  po q day (21/7) 2. Vitamin B12 1 mg IM monthly. 3. IV iron as indicated.     Interim History:  Ms.  Kerri Vasquez is back for follow-up. She has a walking cast on her right foot. She apparently fell and fractured 4 toes. Of course, she had plain films of the right foot. The radiologist said that this appeared to be a pathologic fracture. She has a history of bad rheumatoid arthritis. Yet despite knowing this, he thought that she needed more imaging.  I have yet to see any cancer metastasize to the toes in the 20 years that I have done this..  We now have her on Ibrance in addition to the Faslodex. She is tolerating this pretty well. She's had no problems with nausea or vomiting from this.  There's been no bleeding. She's had no change in bowel or bladder habits.  Pains still has been a constant problem for her.  I do think that she probably has osteoporosis. I think that Zometa might be a good idea.  Because of the fracture, the procedure for liver has been put off until March 11.  She still is bothered by a lot of pain. Again she has bad arthritis.   Currently, her performance status is ECOG 2 Medications:  Current outpatient prescriptions:  .  FLUoxetine (PROZAC) 40 MG capsule, Take 40 mg by mouth daily before breakfast. , Disp: , Rfl:  .  fulvestrant (FASLODEX) 250 MG/5ML injection, Inject 250 mg into the muscle every 30 (thirty) days. One injection each buttock over 1-2 minutes. Warm prior to use., Disp: , Rfl:  .  furosemide (LASIX) 20 MG tablet, TAKE 1 TABLET BY MOUTH EVERY DAY,  Disp: 90 tablet, Rfl: 0 .  levothyroxine (SYNTHROID, LEVOTHROID) 300 MCG tablet, Take 300 mcg by mouth daily before breakfast., Disp: , Rfl:  .  losartan-hydrochlorothiazide (HYZAAR) 100-25 MG per tablet, Take 1 tablet by mouth daily., Disp: , Rfl:  .  methocarbamol (ROBAXIN) 750 MG tablet, Take 750 mg by mouth 4 (four) times daily., Disp: , Rfl:  .  methylphenidate (RITALIN) 5 MG tablet, Take 1 tablet (5 mg total) by mouth 2 (two) times daily., Disp: 60 tablet, Rfl: 0 .  oxyCODONE (OXY IR/ROXICODONE) 5 MG immediate release tablet, Take 5 mg by mouth every 4 (four) hours as needed (For pain.)., Disp: , Rfl:  .  OxyCODONE (OXYCONTIN) 10 mg T12A 12 hr tablet, Take 10 mg by mouth every 12 (twelve) hours. , Disp: , Rfl:  .  oxyCODONE-acetaminophen (PERCOCET) 10-325 MG per tablet, Take 1 tablet by mouth every 6 (six) hours as needed for pain. (Patient taking differently: Take 1 tablet by mouth every 4 (four) hours as needed for pain. ), Disp: 20 tablet, Rfl: 0 .  palbociclib (IBRANCE) 100 MG capsule, Take 1 pill a day for 21 days on and 7 days off.  Take with food, Disp: 21 capsule, Rfl: 6 .  pregabalin (LYRICA) 200 MG capsule, Take 200 mg by mouth 3 (three) times daily., Disp: , Rfl:  .  promethazine (PHENERGAN) 25 MG tablet, Take 1 tablet (25 mg total) by  mouth every 8 (eight) hours as needed for nausea or vomiting., Disp: 30 tablet, Rfl: 2 .  temazepam (RESTORIL) 15 MG capsule, TAKE 1 CAPSULE BY MOUTH AS NEEDED FOR SLEEP, Disp: 30 capsule, Rfl: 0 .  tiZANidine (ZANAFLEX) 4 MG tablet, Take 4 mg by mouth every 6 (six) hours as needed for muscle spasms., Disp: , Rfl:  .  potassium chloride (K-DUR) 10 MEQ tablet, Take 4 tablets (40 mEq total) by mouth daily. (Patient not taking: Reported on 06/29/2014), Disp: 120 tablet, Rfl: 0  Allergies:  Allergies  Allergen Reactions  . Influenza Vac Split [Flu Virus Vaccine] Other (See Comments)    Joint stiffness and renal failure  . Zostavax [Zoster Vaccine Live  ([employer])] Other (See Comments)    Joint stiffness, renal failure  . Adhesive [Tape] Itching and Rash    Past Medical History, Surgical history, Social history, and Family History were reviewed and updated.  Review of Systems: As above  Physical Exam:  height is 5\' 6"  (1.676 m) and weight is 243 lb (110.224 kg). Her oral temperature is 97.4 F (36.3 C). Her blood pressure is 102/45 and her pulse is 71. Her respiration is 16.   Obese white female in no obvious distress. Head and neck exam shows no ocular or oral lesions. There are no palpable cervical or supraclavicular lymph nodes. Lungs are clear. Cardiac exam regular rate and rhythm with no murmurs, rubs or bruits. Abdomen is soft. She is mildly obese. She has good bowel sounds. There is no fluid wave. There is no palpable liver or spleen tip. Back exam shows no tenderness over the spine, ribs or hips. Extremities shows some tenderness over the distal left forearm. She has some decreased range of motion of the left wrist. She has good pulses in her distal extremities. She has no edema in her lower legs. She has the walking brace on her right lower leg/foot. Skin exam shows no rashes, ecchymoses or petechia. Neurological exam is non-focal.  Lab Results  Component Value Date   WBC 3.6* 06/29/2014   HGB 10.4* 06/29/2014   HCT 31.6* 06/29/2014   MCV 98 06/29/2014   PLT 145 06/29/2014     Chemistry      Component Value Date/Time   NA 144 06/29/2014 0824   NA 143 05/14/2014 1225   K 3.6 06/29/2014 0824   K 3.3* 05/14/2014 1225   CL 101 06/29/2014 0824   CL 105 05/14/2014 1225   CO2 28 06/29/2014 0824   CO2 28 05/14/2014 1225   BUN 30* 06/29/2014 0824   BUN 43* 05/14/2014 1225   CREATININE 1.1 06/29/2014 0824   CREATININE 1.51* 05/14/2014 1225      Component Value Date/Time   CALCIUM 9.5 06/29/2014 0824   CALCIUM 8.7 05/14/2014 1225   ALKPHOS 101* 06/29/2014 0824   ALKPHOS 88 05/14/2014 1225   AST 27 06/29/2014 0824   AST  13 05/14/2014 1225   ALT 31 06/29/2014 0824   ALT 10 05/14/2014 1225   BILITOT 0.70 06/29/2014 0824   BILITOT 0.3 05/14/2014 1225     CA 27.29 is 37.  Impression and Plan: Kerri Vasquez is 59 year old female. She has metastatic breast cancer. She now is on Faslodex along with Ibrance.  We will follow along her CA-27-29. Her last level was 25 back in January.  She is scheduled to have the liver ablation procedure in a couple weeks.  I want to go ahead and give her some iron today. I want to  give her the Zometa. I think Zometa will be helpful.  She will get her vitamin B-12.  We will plan to get her back to see [place] in another 3-4 weeks. Volanda Napoleon, MD 2/29/20166:35 PM

## 2014-06-30 ENCOUNTER — Telehealth: Payer: Self-pay | Admitting: Hematology & Oncology

## 2014-06-30 ENCOUNTER — Other Ambulatory Visit: Payer: Self-pay | Admitting: Hematology & Oncology

## 2014-06-30 DIAGNOSIS — M25532 Pain in left wrist: Secondary | ICD-10-CM | POA: Diagnosis not present

## 2014-06-30 DIAGNOSIS — I219 Acute myocardial infarction, unspecified: Secondary | ICD-10-CM

## 2014-06-30 DIAGNOSIS — M4726 Other spondylosis with radiculopathy, lumbar region: Secondary | ICD-10-CM | POA: Diagnosis not present

## 2014-06-30 DIAGNOSIS — G603 Idiopathic progressive neuropathy: Secondary | ICD-10-CM | POA: Diagnosis not present

## 2014-06-30 DIAGNOSIS — N644 Mastodynia: Secondary | ICD-10-CM | POA: Diagnosis not present

## 2014-06-30 HISTORY — DX: Acute myocardial infarction, unspecified: I21.9

## 2014-06-30 NOTE — Telephone Encounter (Signed)
Left pt message with 3-29 appointment

## 2014-07-01 ENCOUNTER — Inpatient Hospital Stay (HOSPITAL_COMMUNITY): Admission: RE | Admit: 2014-07-01 | Payer: Self-pay | Source: Ambulatory Visit

## 2014-07-07 ENCOUNTER — Encounter (HOSPITAL_COMMUNITY)
Admission: RE | Admit: 2014-07-07 | Discharge: 2014-07-07 | Disposition: A | Discharge status: Home / Self Care | Payer: Medicare Other | Source: Ambulatory Visit | Attending: Interventional Radiology | Admitting: Interventional Radiology

## 2014-07-07 ENCOUNTER — Encounter (HOSPITAL_COMMUNITY): Payer: Self-pay

## 2014-07-07 DIAGNOSIS — C50919 Malignant neoplasm of unspecified site of unspecified female breast: Secondary | ICD-10-CM | POA: Insufficient documentation

## 2014-07-07 DIAGNOSIS — Z01818 Encounter for other preprocedural examination: Secondary | ICD-10-CM | POA: Diagnosis not present

## 2014-07-07 DIAGNOSIS — C787 Secondary malignant neoplasm of liver and intrahepatic bile duct: Secondary | ICD-10-CM | POA: Insufficient documentation

## 2014-07-07 HISTORY — DX: Unspecified fracture of unspecified toe(s), initial encounter for closed fracture: S92.919A

## 2014-07-07 LAB — CBC WITH DIFFERENTIAL/PLATELET
BASOS PCT: 1 % (ref 0–1)
Basophils Absolute: 0.1 10*3/uL (ref 0.0–0.1)
EOS ABS: 0.1 10*3/uL (ref 0.0–0.7)
Eosinophils Relative: 2 % (ref 0–5)
HEMATOCRIT: 28.9 % — AB (ref 36.0–46.0)
Hemoglobin: 9.4 g/dL — ABNORMAL LOW (ref 12.0–15.0)
LYMPHS ABS: 1.8 10*3/uL (ref 0.7–4.0)
Lymphocytes Relative: 37 % (ref 12–46)
MCH: 31.8 pg (ref 26.0–34.0)
MCHC: 32.5 g/dL (ref 30.0–36.0)
MCV: 97.6 fL (ref 78.0–100.0)
MONO ABS: 0.3 10*3/uL (ref 0.1–1.0)
Monocytes Relative: 6 % (ref 3–12)
Neutro Abs: 2.6 10*3/uL (ref 1.7–7.7)
Neutrophils Relative %: 54 % (ref 43–77)
Platelets: 184 10*3/uL (ref 150–400)
RBC: 2.96 MIL/uL — ABNORMAL LOW (ref 3.87–5.11)
RDW: 13.7 % (ref 11.5–15.5)
WBC: 4.8 10*3/uL (ref 4.0–10.5)

## 2014-07-07 LAB — COMPREHENSIVE METABOLIC PANEL
ALBUMIN: 3.6 g/dL (ref 3.5–5.2)
ALK PHOS: 89 U/L (ref 39–117)
ALT: 17 U/L (ref 0–35)
AST: 23 U/L (ref 0–37)
Anion gap: 8 (ref 5–15)
BUN: 36 mg/dL — AB (ref 6–23)
CALCIUM: 6.5 mg/dL — AB (ref 8.4–10.5)
CO2: 23 mmol/L (ref 19–32)
CREATININE: 1.22 mg/dL — AB (ref 0.50–1.10)
Chloride: 110 mmol/L (ref 96–112)
GFR, EST AFRICAN AMERICAN: 55 mL/min — AB (ref >90)
GFR, EST NON AFRICAN AMERICAN: 48 mL/min — AB (ref >90)
Glucose, Bld: 89 mg/dL (ref 70–99)
Potassium: 4.3 mmol/L (ref 3.5–5.1)
Sodium: 141 mmol/L (ref 135–145)
TOTAL PROTEIN: 7.1 g/dL (ref 6.0–8.3)
Total Bilirubin: 0.5 mg/dL (ref 0.3–1.2)

## 2014-07-07 LAB — APTT: aPTT: 36 seconds (ref 24–37)

## 2014-07-07 LAB — PROTIME-INR
INR: 1.11 (ref 0.00–1.49)
Prothrombin Time: 14.5 seconds (ref 11.6–15.2)

## 2014-07-07 LAB — SURGICAL PCR SCREEN
MRSA, PCR: POSITIVE — AB
Staphylococcus aureus: POSITIVE — AB

## 2014-07-07 NOTE — Patient Instructions (Signed)
Your procedure is scheduled on:  07/10/14  FRIDAY  Report to [facility] at 1000      AM.   Call this number if you have problems the morning of surgery: [phone]        Do not eat food  Or drink :After Midnight. Thursday NIGHT   Take these medicines the morning of surgery with A SIP OF WATER:Prozac, Levothyroxine, Lyrica, Oxycontin/Oxycodone (as normal schedule) May tale Robaxin if needed  .  Contacts, dentures or partial plates, or metal hairpins  can not be worn to surgery. Your family will be responsible for glasses, dentures, hearing aides while you are in surgery  Leave suitcase in the car. After surgery it may be brought to your room.  For patients admitted to the hospital, checkout time is 11:00 AM day of  discharge.         [facility] IS NOT RESPONSIBLE FOR ANY VALUABLES  Patients discharged the day of surgery will not be allowed to drive home. IF going home the day of surgery, you must have a driver and someone to stay with you for the first 24 hours                                                                                                                                          [facility] - Preparing for Surgery Before surgery, you can play an important role.  Because skin is not sterile, your skin needs to be as free of germs as possible.  You can reduce the number of germs on your skin by washing with CHG (chlorahexidine gluconate) soap before surgery.  CHG is an antiseptic cleaner which kills germs and bonds with the skin to continue killing germs even after washing. Please DO NOT use if you have an allergy to CHG or antibacterial soaps.  If your skin becomes reddened/irritated stop using the CHG and inform your nurse when you arrive at Short Stay. Do not shave (including legs and underarms) for at least 48 hours prior to the first CHG shower.  You may shave your face/neck. Please  follow these instructions carefully:  1.  Shower with CHG Soap the night before surgery and the  morning of Surgery.  2.  If you choose to wash your hair, wash your hair first as usual with your  normal  shampoo.  3.  After you shampoo, rinse your hair and body thoroughly to remove the  shampoo.                           4.  Use CHG as you would any other liquid soap.  You can apply chg directly  to the skin and wash  Gently with a scrungie or clean washcloth.  5.  Apply the CHG Soap to your body ONLY FROM THE NECK DOWN.   Do not use on face/ open                           Wound or open sores. Avoid contact with eyes, ears mouth and genitals (private parts).                       Wash face,  Genitals (private parts) with your normal soap.             6.  Wash thoroughly, paying special attention to the area where your surgery  will be performed.  7.  Thoroughly rinse your body with warm water from the neck down.  8.  DO NOT shower/wash with your normal soap after using and rinsing off  the CHG Soap.                9.  Pat yourself dry with a clean towel.            10.  Wear clean pajamas.            11.  Place clean sheets on your bed the night of your first shower and do not  sleep with pets. Day of Surgery : Do not apply any lotions/deodorants the morning of surgery.  Please wear clean clothes to the hospital/surgery center.  FAILURE TO FOLLOW THESE INSTRUCTIONS MAY RESULT IN THE CANCELLATION OF YOUR SURGERY PATIENT SIGNATURE_________________________________  NURSE SIGNATURE__________________________________  ________________________________________________________________________

## 2014-07-09 ENCOUNTER — Other Ambulatory Visit: Payer: Self-pay | Admitting: Radiology

## 2014-07-10 ENCOUNTER — Inpatient Hospital Stay (HOSPITAL_COMMUNITY)
Admission: RE | Admit: 2014-07-10 | Discharge: 2014-07-18 | DRG: 981 | Disposition: A | Discharge status: Home Health | Payer: Medicare Other | Source: Ambulatory Visit | Attending: Internal Medicine | Admitting: Internal Medicine

## 2014-07-10 ENCOUNTER — Ambulatory Visit (HOSPITAL_COMMUNITY): Payer: Medicare Other | Admitting: Registered Nurse

## 2014-07-10 ENCOUNTER — Ambulatory Visit (HOSPITAL_COMMUNITY)
Admission: RE | Admit: 2014-07-10 | Discharge: 2014-07-10 | Disposition: A | Discharge status: Home / Self Care | Payer: Medicare Other | Source: Ambulatory Visit | Attending: Interventional Radiology | Admitting: Interventional Radiology

## 2014-07-10 ENCOUNTER — Encounter (HOSPITAL_COMMUNITY)
Admission: RE | Disposition: A | Discharge status: Home Health | Payer: Self-pay | Source: Ambulatory Visit | Attending: Internal Medicine

## 2014-07-10 ENCOUNTER — Encounter (HOSPITAL_COMMUNITY): Payer: Self-pay | Admitting: *Deleted

## 2014-07-10 ENCOUNTER — Encounter (HOSPITAL_COMMUNITY): Payer: Self-pay

## 2014-07-10 DIAGNOSIS — Z7189 Other specified counseling: Secondary | ICD-10-CM

## 2014-07-10 DIAGNOSIS — E876 Hypokalemia: Secondary | ICD-10-CM | POA: Diagnosis not present

## 2014-07-10 DIAGNOSIS — N179 Acute kidney failure, unspecified: Secondary | ICD-10-CM | POA: Diagnosis not present

## 2014-07-10 DIAGNOSIS — I959 Hypotension, unspecified: Secondary | ICD-10-CM | POA: Diagnosis not present

## 2014-07-10 DIAGNOSIS — G8929 Other chronic pain: Secondary | ICD-10-CM

## 2014-07-10 DIAGNOSIS — I129 Hypertensive chronic kidney disease with stage 1 through stage 4 chronic kidney disease, or unspecified chronic kidney disease: Secondary | ICD-10-CM | POA: Diagnosis present

## 2014-07-10 DIAGNOSIS — K219 Gastro-esophageal reflux disease without esophagitis: Secondary | ICD-10-CM | POA: Diagnosis present

## 2014-07-10 DIAGNOSIS — I10 Essential (primary) hypertension: Secondary | ICD-10-CM

## 2014-07-10 DIAGNOSIS — R579 Shock, unspecified: Secondary | ICD-10-CM

## 2014-07-10 DIAGNOSIS — Z22322 Carrier or suspected carrier of Methicillin resistant Staphylococcus aureus: Secondary | ICD-10-CM

## 2014-07-10 DIAGNOSIS — T8189XA Other complications of procedures, not elsewhere classified, initial encounter: Principal | ICD-10-CM | POA: Diagnosis present

## 2014-07-10 DIAGNOSIS — C50912 Malignant neoplasm of unspecified site of left female breast: Secondary | ICD-10-CM

## 2014-07-10 DIAGNOSIS — Z515 Encounter for palliative care: Secondary | ICD-10-CM

## 2014-07-10 DIAGNOSIS — M797 Fibromyalgia: Secondary | ICD-10-CM

## 2014-07-10 DIAGNOSIS — Z9013 Acquired absence of bilateral breasts and nipples: Secondary | ICD-10-CM | POA: Diagnosis present

## 2014-07-10 DIAGNOSIS — E039 Hypothyroidism, unspecified: Secondary | ICD-10-CM | POA: Diagnosis present

## 2014-07-10 DIAGNOSIS — R918 Other nonspecific abnormal finding of lung field: Secondary | ICD-10-CM | POA: Diagnosis not present

## 2014-07-10 DIAGNOSIS — Z809 Family history of malignant neoplasm, unspecified: Secondary | ICD-10-CM | POA: Diagnosis not present

## 2014-07-10 DIAGNOSIS — Z853 Personal history of malignant neoplasm of breast: Secondary | ICD-10-CM

## 2014-07-10 DIAGNOSIS — C50919 Malignant neoplasm of unspecified site of unspecified female breast: Secondary | ICD-10-CM

## 2014-07-10 DIAGNOSIS — N183 Chronic kidney disease, stage 3 (moderate): Secondary | ICD-10-CM | POA: Diagnosis present

## 2014-07-10 DIAGNOSIS — Z9884 Bariatric surgery status: Secondary | ICD-10-CM

## 2014-07-10 DIAGNOSIS — R110 Nausea: Secondary | ICD-10-CM | POA: Diagnosis not present

## 2014-07-10 DIAGNOSIS — Z452 Encounter for adjustment and management of vascular access device: Secondary | ICD-10-CM

## 2014-07-10 DIAGNOSIS — Z66 Do not resuscitate: Secondary | ICD-10-CM | POA: Diagnosis not present

## 2014-07-10 DIAGNOSIS — F329 Major depressive disorder, single episode, unspecified: Secondary | ICD-10-CM | POA: Diagnosis present

## 2014-07-10 DIAGNOSIS — E119 Type 2 diabetes mellitus without complications: Secondary | ICD-10-CM | POA: Diagnosis not present

## 2014-07-10 DIAGNOSIS — I420 Dilated cardiomyopathy: Secondary | ICD-10-CM | POA: Diagnosis not present

## 2014-07-10 DIAGNOSIS — I9589 Other hypotension: Secondary | ICD-10-CM

## 2014-07-10 DIAGNOSIS — R531 Weakness: Secondary | ICD-10-CM | POA: Diagnosis not present

## 2014-07-10 DIAGNOSIS — R1013 Epigastric pain: Secondary | ICD-10-CM | POA: Diagnosis not present

## 2014-07-10 DIAGNOSIS — D510 Vitamin B12 deficiency anemia due to intrinsic factor deficiency: Secondary | ICD-10-CM

## 2014-07-10 DIAGNOSIS — J90 Pleural effusion, not elsewhere classified: Secondary | ICD-10-CM

## 2014-07-10 DIAGNOSIS — C801 Malignant (primary) neoplasm, unspecified: Secondary | ICD-10-CM | POA: Diagnosis not present

## 2014-07-10 DIAGNOSIS — I5181 Takotsubo syndrome: Secondary | ICD-10-CM | POA: Diagnosis present

## 2014-07-10 DIAGNOSIS — G894 Chronic pain syndrome: Secondary | ICD-10-CM | POA: Diagnosis not present

## 2014-07-10 DIAGNOSIS — I214 Non-ST elevation (NSTEMI) myocardial infarction: Secondary | ICD-10-CM | POA: Diagnosis not present

## 2014-07-10 DIAGNOSIS — G9340 Encephalopathy, unspecified: Secondary | ICD-10-CM

## 2014-07-10 DIAGNOSIS — M545 Low back pain, unspecified: Secondary | ICD-10-CM

## 2014-07-10 DIAGNOSIS — J690 Pneumonitis due to inhalation of food and vomit: Secondary | ICD-10-CM | POA: Diagnosis not present

## 2014-07-10 DIAGNOSIS — R079 Chest pain, unspecified: Secondary | ICD-10-CM | POA: Insufficient documentation

## 2014-07-10 DIAGNOSIS — R1010 Upper abdominal pain, unspecified: Secondary | ICD-10-CM

## 2014-07-10 DIAGNOSIS — D440 Neoplasm of uncertain behavior of thyroid gland: Secondary | ICD-10-CM

## 2014-07-10 DIAGNOSIS — Z6837 Body mass index (BMI) 37.0-37.9, adult: Secondary | ICD-10-CM

## 2014-07-10 DIAGNOSIS — T40601A Poisoning by unspecified narcotics, accidental (unintentional), initial encounter: Secondary | ICD-10-CM | POA: Diagnosis not present

## 2014-07-10 DIAGNOSIS — I2510 Atherosclerotic heart disease of native coronary artery without angina pectoris: Secondary | ICD-10-CM | POA: Diagnosis not present

## 2014-07-10 DIAGNOSIS — C787 Secondary malignant neoplasm of liver and intrahepatic bile duct: Secondary | ICD-10-CM | POA: Diagnosis present

## 2014-07-10 DIAGNOSIS — Z79899 Other long term (current) drug therapy: Secondary | ICD-10-CM | POA: Diagnosis not present

## 2014-07-10 DIAGNOSIS — R570 Cardiogenic shock: Secondary | ICD-10-CM | POA: Diagnosis present

## 2014-07-10 DIAGNOSIS — C50911 Malignant neoplasm of unspecified site of right female breast: Secondary | ICD-10-CM | POA: Diagnosis not present

## 2014-07-10 DIAGNOSIS — R0600 Dyspnea, unspecified: Secondary | ICD-10-CM

## 2014-07-10 DIAGNOSIS — I5021 Acute systolic (congestive) heart failure: Secondary | ICD-10-CM | POA: Diagnosis not present

## 2014-07-10 DIAGNOSIS — E1122 Type 2 diabetes mellitus with diabetic chronic kidney disease: Secondary | ICD-10-CM | POA: Diagnosis present

## 2014-07-10 DIAGNOSIS — Y838 Other surgical procedures as the cause of abnormal reaction of the patient, or of later complication, without mention of misadventure at the time of the procedure: Secondary | ICD-10-CM | POA: Diagnosis present

## 2014-07-10 DIAGNOSIS — R0602 Shortness of breath: Secondary | ICD-10-CM | POA: Diagnosis not present

## 2014-07-10 DIAGNOSIS — I952 Hypotension due to drugs: Secondary | ICD-10-CM | POA: Diagnosis not present

## 2014-07-10 DIAGNOSIS — G4730 Sleep apnea, unspecified: Secondary | ICD-10-CM

## 2014-07-10 DIAGNOSIS — F32A Depression, unspecified: Secondary | ICD-10-CM

## 2014-07-10 LAB — CBC
HCT: 33.5 % — ABNORMAL LOW (ref 36.0–46.0)
Hemoglobin: 11.3 g/dL — ABNORMAL LOW (ref 12.0–15.0)
MCH: 32.3 pg (ref 26.0–34.0)
MCHC: 33.7 g/dL (ref 30.0–36.0)
MCV: 95.7 fL (ref 78.0–100.0)
Platelets: 203 10*3/uL (ref 150–400)
RBC: 3.5 MIL/uL — ABNORMAL LOW (ref 3.87–5.11)
RDW: 13.1 % (ref 11.5–15.5)
WBC: 4.8 10*3/uL (ref 4.0–10.5)

## 2014-07-10 LAB — GLUCOSE, CAPILLARY
Glucose-Capillary: 83 mg/dL (ref 70–99)
Glucose-Capillary: 164 mg/dL — ABNORMAL HIGH (ref 70–99)

## 2014-07-10 LAB — BASIC METABOLIC PANEL
ANION GAP: 8 (ref 5–15)
BUN: 21 mg/dL (ref 6–23)
CO2: 23 mmol/L (ref 19–32)
CREATININE: 1 mg/dL (ref 0.50–1.10)
Calcium: 7.6 mg/dL — ABNORMAL LOW (ref 8.4–10.5)
Chloride: 109 mmol/L (ref 96–112)
GFR, EST AFRICAN AMERICAN: 70 mL/min — AB (ref >90)
GFR, EST NON AFRICAN AMERICAN: 60 mL/min — AB (ref >90)
Glucose, Bld: 99 mg/dL (ref 70–99)
Potassium: 3.8 mmol/L (ref 3.5–5.1)
Sodium: 140 mmol/L (ref 135–145)

## 2014-07-10 LAB — TYPE AND SCREEN
ABO/RH(D): O POS
Antibody Screen: NEGATIVE

## 2014-07-10 SURGERY — RADIO FREQUENCY ABLATION
Anesthesia: General

## 2014-07-10 MED ORDER — OXYCODONE HCL ER 20 MG PO T12A
20.0000 mg | EXTENDED_RELEASE_TABLET | Freq: Once | ORAL | Status: AC
Start: 2014-07-10 — End: 2014-07-10
  Administered 2014-07-10: 20 mg via ORAL

## 2014-07-10 MED ORDER — NEOSTIGMINE METHYLSULFATE 10 MG/10ML IV SOLN
INTRAVENOUS | Status: DC | PRN
  Administered 2014-07-10: 5 mg via INTRAVENOUS

## 2014-07-10 MED ORDER — FENTANYL CITRATE 0.05 MG/ML IJ SOLN: 25.0000 ug | INTRAMUSCULAR | Status: DC | PRN

## 2014-07-10 MED ORDER — FENTANYL CITRATE 0.05 MG/ML IJ SOLN
INTRAMUSCULAR | Status: AC
  Filled 2014-07-10: qty 5

## 2014-07-10 MED ORDER — HYDROMORPHONE HCL 1 MG/ML IJ SOLN
1.0000 mg | Freq: Once | INTRAMUSCULAR | Status: AC
  Administered 2014-07-10: 1 mg via INTRAVENOUS
  Filled 2014-07-10: qty 1

## 2014-07-10 MED ORDER — PROPOFOL 10 MG/ML IV BOLUS
INTRAVENOUS | Status: AC
  Filled 2014-07-10: qty 20

## 2014-07-10 MED ORDER — CISATRACURIUM BESYLATE 20 MG/10ML IV SOLN
INTRAVENOUS | Status: AC
  Filled 2014-07-10: qty 10

## 2014-07-10 MED ORDER — ONDANSETRON HCL 4 MG/2ML IJ SOLN
INTRAMUSCULAR | Status: AC
  Filled 2014-07-10: qty 2

## 2014-07-10 MED ORDER — CISATRACURIUM BESYLATE 20 MG/10ML IV SOLN
INTRAVENOUS | Status: AC
  Filled 2014-07-10: qty 30

## 2014-07-10 MED ORDER — HYDROMORPHONE 0.3 MG/ML IV SOLN
INTRAVENOUS | Status: DC
Start: 2014-07-10 — End: 2014-07-11
  Administered 2014-07-10: 20:00:00 via INTRAVENOUS
  Administered 2014-07-10: 2.1 mg via INTRAVENOUS
  Administered 2014-07-11: 11:00:00 via INTRAVENOUS
  Administered 2014-07-11: 1.8 mg via INTRAVENOUS
  Administered 2014-07-11: 3.6 mg via INTRAVENOUS
  Administered 2014-07-11: 0.6 mg via INTRAVENOUS
  Filled 2014-07-10 (×2): qty 25

## 2014-07-10 MED ORDER — MIDAZOLAM HCL 2 MG/2ML IJ SOLN
INTRAMUSCULAR | Status: AC
Start: 2014-07-10 — End: 2014-07-11
  Filled 2014-07-10: qty 2

## 2014-07-10 MED ORDER — KETAMINE HCL 10 MG/ML IJ SOLN
INTRAMUSCULAR | Status: DC | PRN
  Administered 2014-07-10: 40 mg via INTRAVENOUS

## 2014-07-10 MED ORDER — KETAMINE HCL 10 MG/ML IJ SOLN
INTRAMUSCULAR | Status: AC
  Filled 2014-07-10: qty 1

## 2014-07-10 MED ORDER — LACTATED RINGERS IV SOLN
INTRAVENOUS | Status: DC
  Administered 2014-07-10 (×2): via INTRAVENOUS

## 2014-07-10 MED ORDER — SODIUM CHLORIDE 0.9 % IV SOLN
510.0000 mg | Freq: Once | INTRAVENOUS | Status: AC
  Administered 2014-07-10: 510 mg via INTRAVENOUS
  Filled 2014-07-10: qty 17

## 2014-07-10 MED ORDER — FENTANYL CITRATE 0.05 MG/ML IJ SOLN
INTRAMUSCULAR | Status: DC | PRN
  Administered 2014-07-10: 50 ug via INTRAVENOUS
  Administered 2014-07-10: 100 ug via INTRAVENOUS
  Administered 2014-07-10 (×3): 50 ug via INTRAVENOUS
  Administered 2014-07-10: 100 ug via INTRAVENOUS

## 2014-07-10 MED ORDER — CISATRACURIUM BESYLATE (PF) 10 MG/5ML IV SOLN
INTRAVENOUS | Status: DC | PRN
  Administered 2014-07-10: 2 mg via INTRAVENOUS
  Administered 2014-07-10 (×3): 4 mg via INTRAVENOUS

## 2014-07-10 MED ORDER — GLYCOPYRROLATE 0.2 MG/ML IJ SOLN
INTRAMUSCULAR | Status: DC | PRN
  Administered 2014-07-10: .8 mg via INTRAVENOUS

## 2014-07-10 MED ORDER — ONDANSETRON HCL 4 MG/2ML IJ SOLN
4.0000 mg | Freq: Four times a day (QID) | INTRAMUSCULAR | Status: DC | PRN
  Administered 2014-07-10 – 2014-07-17 (×5): 4 mg via INTRAVENOUS
  Filled 2014-07-10 (×7): qty 2

## 2014-07-10 MED ORDER — PROMETHAZINE HCL 25 MG/ML IJ SOLN
INTRAMUSCULAR | Status: AC
  Filled 2014-07-10: qty 1

## 2014-07-10 MED ORDER — NALOXONE HCL 0.4 MG/ML IJ SOLN
0.4000 mg | INTRAMUSCULAR | Status: DC | PRN
  Administered 2014-07-12: 0.2 mg via INTRAVENOUS

## 2014-07-10 MED ORDER — DIPHENHYDRAMINE HCL 12.5 MG/5ML PO ELIX
12.5000 mg | ORAL_SOLUTION | Freq: Four times a day (QID) | ORAL | Status: DC | PRN
  Filled 2014-07-10: qty 5

## 2014-07-10 MED ORDER — ONDANSETRON HCL 4 MG/2ML IJ SOLN
4.0000 mg | Freq: Four times a day (QID) | INTRAMUSCULAR | Status: DC | PRN
Start: 2014-07-10 — End: 2014-07-12
  Administered 2014-07-11 – 2014-07-12 (×2): 4 mg via INTRAVENOUS

## 2014-07-10 MED ORDER — DOCUSATE SODIUM 100 MG PO CAPS
100.0000 mg | ORAL_CAPSULE | Freq: Two times a day (BID) | ORAL | Status: DC
  Administered 2014-07-10 – 2014-07-16 (×11): 100 mg via ORAL
  Filled 2014-07-10 (×18): qty 1

## 2014-07-10 MED ORDER — SUCCINYLCHOLINE CHLORIDE 20 MG/ML IJ SOLN
INTRAMUSCULAR | Status: DC | PRN
  Administered 2014-07-10: 100 mg via INTRAVENOUS

## 2014-07-10 MED ORDER — HYDROCODONE-ACETAMINOPHEN 5-325 MG PO TABS
1.0000 | ORAL_TABLET | ORAL | Status: DC | PRN
  Administered 2014-07-11 – 2014-07-12 (×3): 2 via ORAL
  Filled 2014-07-10 (×3): qty 2

## 2014-07-10 MED ORDER — ALUM & MAG HYDROXIDE-SIMETH 200-200-20 MG/5ML PO SUSP
30.0000 mL | ORAL | Status: DC | PRN
  Administered 2014-07-10 – 2014-07-17 (×6): 30 mL via ORAL
  Filled 2014-07-10 (×6): qty 30

## 2014-07-10 MED ORDER — HYDROMORPHONE HCL 1 MG/ML IJ SOLN
INTRAMUSCULAR | Status: AC
  Filled 2014-07-10: qty 1

## 2014-07-10 MED ORDER — SODIUM CHLORIDE 0.9 % IJ SOLN: 9.0000 mL | INTRAMUSCULAR | Status: DC | PRN

## 2014-07-10 MED ORDER — MIDAZOLAM HCL 5 MG/5ML IJ SOLN
INTRAMUSCULAR | Status: DC | PRN
  Administered 2014-07-10: 2 mg via INTRAVENOUS

## 2014-07-10 MED ORDER — PROMETHAZINE HCL 25 MG/ML IJ SOLN
25.0000 mg | Freq: Four times a day (QID) | INTRAMUSCULAR | Status: DC | PRN
  Administered 2014-07-10 – 2014-07-17 (×8): 25 mg via INTRAVENOUS
  Filled 2014-07-10 (×9): qty 1

## 2014-07-10 MED ORDER — ONDANSETRON HCL 4 MG/2ML IJ SOLN
4.0000 mg | Freq: Once | INTRAMUSCULAR | Status: AC | PRN
  Administered 2014-07-10: 4 mg via INTRAVENOUS

## 2014-07-10 MED ORDER — PROMETHAZINE HCL 25 MG/ML IJ SOLN
6.2500 mg | INTRAMUSCULAR | Status: AC | PRN
  Administered 2014-07-10 (×2): 12.5 mg via INTRAVENOUS

## 2014-07-10 MED ORDER — HYDROMORPHONE HCL 1 MG/ML IJ SOLN
0.2500 mg | INTRAMUSCULAR | Status: DC | PRN
  Administered 2014-07-10 (×2): 0.5 mg via INTRAVENOUS

## 2014-07-10 MED ORDER — NEOSTIGMINE METHYLSULFATE 10 MG/10ML IV SOLN
INTRAVENOUS | Status: AC
Start: 2014-07-10 — End: 2014-07-10
  Filled 2014-07-10: qty 1

## 2014-07-10 MED ORDER — PIPERACILLIN-TAZOBACTAM 3.375 G IVPB
3.3750 g | Freq: Once | INTRAVENOUS | Status: AC
  Administered 2014-07-10: 3.375 g via INTRAVENOUS
  Filled 2014-07-10: qty 50

## 2014-07-10 MED ORDER — HYDROMORPHONE HCL 1 MG/ML IJ SOLN
0.5000 mg | INTRAMUSCULAR | Status: AC | PRN
  Administered 2014-07-10 (×4): 0.5 mg via INTRAVENOUS
  Filled 2014-07-10: qty 1

## 2014-07-10 MED ORDER — PROPOFOL 10 MG/ML IV BOLUS
INTRAVENOUS | Status: DC | PRN
  Administered 2014-07-10: 40 mg via INTRAVENOUS
  Administered 2014-07-10: 160 mg via INTRAVENOUS

## 2014-07-10 MED ORDER — SENNOSIDES-DOCUSATE SODIUM 8.6-50 MG PO TABS
1.0000 | ORAL_TABLET | Freq: Every day | ORAL | Status: DC | PRN
  Administered 2014-07-14: 1 via ORAL
  Filled 2014-07-10 (×2): qty 1

## 2014-07-10 MED ORDER — LIDOCAINE HCL (CARDIAC) 20 MG/ML IV SOLN
INTRAVENOUS | Status: AC
  Filled 2014-07-10: qty 5

## 2014-07-10 MED ORDER — DIPHENHYDRAMINE HCL 50 MG/ML IJ SOLN: 12.5000 mg | Freq: Four times a day (QID) | INTRAMUSCULAR | Status: DC | PRN

## 2014-07-10 MED ORDER — ONDANSETRON HCL 4 MG/2ML IJ SOLN
INTRAMUSCULAR | Status: DC | PRN
  Administered 2014-07-10: 4 mg via INTRAVENOUS

## 2014-07-10 MED ORDER — ONDANSETRON HCL 4 MG/2ML IJ SOLN
INTRAMUSCULAR | Status: AC
Start: 2014-07-10 — End: 2014-07-11
  Filled 2014-07-10: qty 2

## 2014-07-10 MED ORDER — MIDAZOLAM HCL 2 MG/2ML IJ SOLN
1.0000 mg | INTRAMUSCULAR | Status: DC
  Administered 2014-07-10: 1 mg via INTRAVENOUS

## 2014-07-10 MED ORDER — LIDOCAINE HCL (CARDIAC) 20 MG/ML IV SOLN
INTRAVENOUS | Status: DC | PRN
  Administered 2014-07-10: 100 mg via INTRAVENOUS

## 2014-07-10 MED ORDER — MEPERIDINE HCL 50 MG/ML IJ SOLN: 6.2500 mg | INTRAMUSCULAR | Status: DC | PRN

## 2014-07-10 MED ORDER — IOHEXOL 350 MG/ML SOLN
100.0000 mL | Freq: Once | INTRAVENOUS | Status: AC | PRN
  Administered 2014-07-10: 100 mL via INTRAVENOUS

## 2014-07-10 MED ORDER — MIDAZOLAM HCL 2 MG/2ML IJ SOLN
INTRAMUSCULAR | Status: AC
  Filled 2014-07-10: qty 2

## 2014-07-10 MED ORDER — GLYCOPYRROLATE 0.2 MG/ML IJ SOLN
INTRAMUSCULAR | Status: AC
  Filled 2014-07-10: qty 4

## 2014-07-10 NOTE — Anesthesia Preprocedure Evaluation (Addendum)
Anesthesia Evaluation  Patient identified by MRN, date of birth, ID band Patient awake    Reviewed: Allergy & Precautions, H&P , NPO status , Patient's Chart, lab work & pertinent test results, reviewed documented beta blocker date and time   Airway Mallampati: II  TM Distance: >3 FB Neck ROM: full    Dental  (+) Chipped, Missing,    Pulmonary sleep apnea and Continuous Positive Airway Pressure Ventilation ,  breath sounds clear to auscultation  Pulmonary exam normal       Cardiovascular hypertension, Pt. on medications and Pt. on home beta blockers Rhythm:regular Rate:Normal     Neuro/Psych  Headaches, PSYCHIATRIC DISORDERS Depression  Neuromuscular disease    GI/Hepatic Neg liver ROS, GERD-  Medicated and Controlled,  Endo/Other  diabetes, Well Controlled, Type 2, Oral Hypoglycemic AgentsHypothyroidism Morbid obesity  Renal/GU Renal InsufficiencyRenal disease  negative genitourinary   Musculoskeletal  (+) Arthritis -, Rheumatoid disorders,  Fibromyalgia -, narcotic dependent  Abdominal (+) + obese,   Peds  Hematology  (+) Blood dyscrasia, anemia , Pernicious anemia 2013   Anesthesia Other Findings Breast Ca  Reproductive/Obstetrics negative OB ROS                            Anesthesia Physical  Anesthesia Plan  ASA: III  Anesthesia Plan: General   Post-op Pain Management:    Induction: Intravenous  Airway Management Planned: Oral ETT  Additional Equipment: None  Intra-op Plan:   Post-operative Plan: Extubation in OR  Informed Consent: I have reviewed the patients History and Physical, chart, labs and discussed the procedure including the risks, benefits and alternatives for the proposed anesthesia with the patient or authorized representative who has indicated his/her understanding and acceptance.   Dental advisory given  Plan Discussed with: CRNA  Anesthesia Plan Comments:          Anesthesia Quick Evaluation

## 2014-07-10 NOTE — Transfer of Care (Signed)
Immediate Anesthesia Transfer of Care Note  Patient: Kerri Vasquez  Procedure(s) Performed: Procedure(s): THERMAL ABLATION OF LIVER     (N/A)  Patient Location: PACU  Anesthesia Type:General  Level of Consciousness: awake, alert , oriented and patient cooperative  Airway & Oxygen Therapy: Patient Spontanous Breathing and Patient connected to face mask oxygen  Post-op Assessment: Report given to RN, Post -op Vital signs reviewed and stable and Patient moving all extremities  Post vital signs: Reviewed and stable  Last Vitals:  Filed Vitals:   07/10/14 1026  BP: 173/96  Pulse: 86  Temp: 36.7 C  Resp: 18    Complications: No apparent anesthesia complications

## 2014-07-10 NOTE — H&P (Signed)
Chief Complaint: Metastatic breast cancer to liver  Referring Physician(s): Dr, Marin Olp  History of Present Illness: Kerri Vasquez is a 59 y.o. female with a history of right breast carcinoma approximately 8years ago and underwent bilateral mastectomy at that time. On a recent abdominal CT scan to evaluate abdominal pain and a ventral hernia, a new liver lesion was identified, confirmed on ultrasound-guided FNA biopsy 11/27/11 to represent metastatic breast carcinoma. PET CT showed no other metastatic disease. She had biopsy-proven metastatic disease to solitary lesion in the liver, and underwent RF ablation on 01/26/2012. Follow-up surveillance imaging had remained negative until her most recent PET CT, which demonstrated new hypermetabolic solitary liver lesion. She presents today for CT-guided biopsy and thermal ablation of the medial left hepatic lobe lesion.   Past Medical History  Diagnosis Date  . Depression   . Hypertension   . Migraine   . Hypothyroidism   . Incisional hernia   . Spinal stenosis   . Scoliosis   . Sciatica   . Intestinal obstruction   . GERD (gastroesophageal reflux disease)   . Septic arthritis 01/28/10    and spinal stenosis , moderate scoliosis   . Osteoarthritis of multiple joints   . Anemia   . Pernicious anemia 05/30/2011    Iron transfusion and VB12 on 06/06/11  . Trouble swallowing   . Leg swelling     right   . Abdominal pain   . Constipation   . Nausea & vomiting   . Sleep apnea     hx of off machine x 4 years   . Diabetes mellitus     type II, controlled by diet 05/25/11   . 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 Renal insufficiency     hx renal failure 2011  . Fibromyalgia   . Fracture of multiple toes     right toes x 4 toes- excluding small toe    Past Surgical History  Procedure Laterality Date  . Tonsillectomy  1969  . Cystectomy  2009    left breast  . Bariatric surgery  2006  . Other  surgical history      surgery for intestinal blockage 2011   . Multiple extractions with alveoloplasty  06/08/2011    Procedure: MULTIPLE EXTRACION WITH ALVEOLOPLASTY;  Surgeon: Lenn Cal, DDS;  Location: [facility];  Service: Oral Surgery;  Laterality: N/A;  Extraction of tooth #'s 3,4,7 with alveoloplasty and Gross Debridement of Remaining Teeth  . Gastric bypass  2006  . Intestinal blockage  2011    surgery for this   . Breast surgery  2008     bilateral mastectomy  . Breast reconstruction  2008, 2009  . Cholecystectomy  06/29/85  . Radio frequency ablation to liver  sept 27, 2013  . Thyroid lobectomy  02/16/2012    Procedure: THYROID LOBECTOMY;  Surgeon: Earnstine Regal, MD;  Location: [facility];  Service: General;  Laterality: Right;    Allergies: Influenza vac split; Zostavax; and Adhesive  Medications: Prior to Admission medications   Medication Sig Start Date End Date Taking? Authorizing Provider  acetaminophen (TYLENOL) 500 MG tablet Take 1,000 mg by mouth every 4 (four) hours as needed for moderate pain.   Yes Historical Provider, MD  FLUoxetine (PROZAC) 40 MG capsule Take 40 mg by mouth daily before breakfast.    Yes Historical Provider, MD  fulvestrant (FASLODEX) 250 MG/5ML injection Inject 250 mg into the  muscle every 30 (thirty) days. One injection each buttock over 1-2 minutes. Warm prior to use.   Yes Historical Provider, MD  furosemide (LASIX) 20 MG tablet TAKE 1 TABLET BY MOUTH EVERY DAY Patient taking differently: Take one tablet by mouth every mornig 02/04/14  Yes Volanda Napoleon, MD  levothyroxine (SYNTHROID, LEVOTHROID) 300 MCG tablet Take 300 mcg by mouth daily before breakfast.   Yes Historical Provider, MD  losartan-hydrochlorothiazide (HYZAAR) 100-25 MG per tablet Take 1 tablet by mouth every morning.    Yes Historical Provider, MD  methocarbamol (ROBAXIN) 750 MG tablet Take 750 mg by mouth 4 (four) times daily.   Yes Historical Provider, MD  methylphenidate  (RITALIN) 5 MG tablet Take 1 tablet (5 mg total) by mouth 2 (two) times daily. Patient taking differently: Take 5 mg by mouth every morning.  05/14/14  Yes Volanda Napoleon, MD  oxyCODONE (OXY IR/ROXICODONE) 5 MG immediate release tablet Take 5 mg by mouth every 4 (four) hours as needed (For pain.).   Yes Historical Provider, MD  OxyCODONE (OXYCONTIN) 20 mg T12A 12 hr tablet Take 20 mg by mouth every 12 (twelve) hours.   Yes Historical Provider, MD  oxyCODONE-acetaminophen (PERCOCET) 10-325 MG per tablet Take 1 tablet by mouth every 6 (six) hours as needed for pain. Patient taking differently: Take 1 tablet by mouth every 4 (four) hours as needed for pain.  01/05/14  Yes Virgel Manifold, MD  palbociclib [facility]) 100 MG capsule Take 1 pill a day for 21 days on and 7 days off.  Take with food 05/14/14  Yes Volanda Napoleon, MD  pregabalin (LYRICA) 200 MG capsule Take 200 mg by mouth 3 (three) times daily.   Yes Historical Provider, MD  promethazine (PHENERGAN) 25 MG tablet Take 1 tablet (25 mg total) by mouth every 8 (eight) hours as needed for nausea or vomiting. Patient taking differently: Take 25 mg by mouth every 6 (six) hours as needed for nausea or vomiting.  11/25/13  Yes Volanda Napoleon, MD  temazepam (RESTORIL) 15 MG capsule TAKE 1 CAPSULE BY MOUTH EVERY DAY AT BEDTIME AS NEEDED FOR SLEEP Patient taking differently: TAKE 1 CAPSULE BY MOUTH EVERY DAY AT BEDTIME AS NEEDED FOR SLEEP////07/07/14 per daughter may be repeated x 1 06/30/14  Yes Volanda Napoleon, MD  tiZANidine (ZANAFLEX) 4 MG tablet Take 4 mg by mouth every 6 (six) hours as needed for muscle spasms.   Yes Historical Provider, MD  potassium chloride (K-DUR) 10 MEQ tablet Take 4 tablets (40 mEq total) by mouth daily. Patient not taking: Reported on 06/29/2014 05/27/14   Volanda Napoleon, MD    Family History  Problem Relation Age of Onset  . Cancer Mother     breast  . Hypertension Mother   . Anesthesia problems Daughter     History    Social History  . Marital Status: Divorced    Spouse Name: N/A  . Number of Children: 1  . Years of Education: N/A   Social History Main Topics  . Smoking status: Never Smoker   . Smokeless tobacco: Never Used     Comment: never used tobacco  . Alcohol Use: No  . Drug Use: No  . Sexual Activity: Not on file   Other Topics Concern  . None   Social History Narrative   Caffeine use: 3 cups coffee/tea daily   Regular exercise: no              Review of  Systems   Constitutional: Negative for fever and chills.  Respiratory: Negative for cough and shortness of breath.   Cardiovascular: Negative for chest pain.  Gastrointestinal: Negative for nausea, vomiting, abdominal pain and blood in stool.  Genitourinary: Negative for dysuria and hematuria.  Musculoskeletal: Positive for back pain.  Neurological: Negative for headaches.  Hematological: Does not bruise/bleed easily.  Psychiatric/Behavioral: The patient is nervous/anxious.     Vital Signs: BP 173/96 mmHg  Pulse 86  Temp(Src) 98.1 F (36.7 C) (Oral)  Resp 18  Ht 5\' 6"  (1.676 m)  Wt 249 lb (112.946 kg)  BMI 40.21 kg/m2  SpO2 100%  LMP 08/30/2006  Physical Exam  Constitutional: She is oriented to person, place, and time. She appears well-developed and well-nourished.  Cardiovascular: Normal rate and regular rhythm.   Pulmonary/Chest: Effort normal and breath sounds normal.  Abdominal: Soft. Bowel sounds are normal. There is no tenderness.  obese  Musculoskeletal: Normal range of motion. She exhibits edema.  Recently fractured right first toe  Neurological: She is alert and oriented to person, place, and time.    Imaging: No results found.  Labs:  CBC:  Recent Labs  05/14/14 1225 06/29/14 0824 07/07/14 1005 07/10/14 1110  WBC 5.1 3.6* 4.8 4.8  HGB 10.6* 10.4* 9.4* 11.3*  HCT 33.2* 31.6* 28.9* 33.5*  PLT 188 145 184 203    COAGS:  Recent Labs  07/07/14 1005  INR 1.11  APTT 36     BMP:  Recent Labs  07/26/13 1630  04/16/14 1013 05/14/14 1225 06/29/14 0824 07/07/14 1005  NA 140  < > 138 143 144 141  K 4.0  < > 2.8* 3.3* 3.6 4.3  CL 104  < > 102 105 101 110  CO2 22  < > 28 28 28 23   GLUCOSE 77  < > 138* 100* 80 89  BUN 14  < > 38* 43* 30* 36*  CALCIUM 9.5  < > 8.8 8.7 9.5 6.5*  CREATININE 0.80  < > 1.4* 1.51* 1.1 1.22*  GFRNONAA 80*  --   --   --   --  48*  GFRAA >90  --   --   --   --  55*  < > = values in this interval not displayed.  LIVER FUNCTION TESTS:  Recent Labs  02/16/14 1042 03/18/14 0821 04/16/14 1013 05/14/14 1225 06/29/14 0824 07/07/14 1005  BILITOT 0.4 0.3 0.50 0.3 0.70 0.5  AST 12 12 16 13 27 23   ALT 8 10 28 10 31 17   ALKPHOS 78 80 97* 88 101* 89  PROT 6.6 6.3 6.9 6.4 7.2 7.1  ALBUMIN 3.8 3.5  --  3.4*  --  3.6    TUMOR MARKERS: No results for input(s): AFPTM, CEA, CA199, CHROMGRNA in the last 8760 hours.  Assessment and Plan: Patient with history of metastatic breast cancer to liver and prior hepatic RFA in 2013; now with PET scan revealing a new single focus of metabolic activity in the medial left hepatic lobe. She presents today for CT-guided biopsy and thermal ablation of the left hepatic lobe lesion. Details/risks of procedure, including but not limited to, internal bleeding , infection, renal dysfunction, prolonged intubation and death discussed with patient and daughter with their apparent understanding and consent. Following the procedure the patient will be admitted for overnight observation.    Signed: Autumn Messing 07/10/2014, 11:40 AM   I spent a total of 30 minutes face to face in clinical consultation, greater than 50% of  which was counseling/coordinating care for CT-guided biopsy and thermal ablation of left hepatic lobe lesion

## 2014-07-10 NOTE — Procedures (Signed)
MW ablation recurrent liver lesion under CT 37D bx x3 No complication No blood loss. See complete dictation in [facility]

## 2014-07-10 NOTE — Anesthesia Procedure Notes (Signed)
Procedure Name: Intubation Performed by: Carleene Cooper A Pre-anesthesia Checklist: Patient identified, Emergency Drugs available, Suction available, Patient being monitored and Timeout performed Patient Re-evaluated:Patient Re-evaluated prior to inductionOxygen Delivery Method: Circle system utilized Preoxygenation: Pre-oxygenation with 100% oxygen Intubation Type: IV induction Ventilation: Mask ventilation without difficulty Laryngoscope Size: Mac and 4 Grade View: Grade I Tube type: Oral Tube size: 7.5 mm Number of attempts: 1 Airway Equipment and Method: Stylet Placement Confirmation: ETT inserted through vocal cords under direct vision,  positive ETCO2 and breath sounds checked- equal and bilateral Secured at: 19 cm Tube secured with: Tape Dental Injury: Teeth and Oropharynx as per pre-operative assessment

## 2014-07-11 ENCOUNTER — Ambulatory Visit (HOSPITAL_COMMUNITY): Payer: Medicare Other

## 2014-07-11 ENCOUNTER — Encounter (HOSPITAL_COMMUNITY): Payer: Self-pay | Admitting: Radiology

## 2014-07-11 DIAGNOSIS — E1122 Type 2 diabetes mellitus with diabetic chronic kidney disease: Secondary | ICD-10-CM | POA: Diagnosis present

## 2014-07-11 DIAGNOSIS — E039 Hypothyroidism, unspecified: Secondary | ICD-10-CM | POA: Diagnosis present

## 2014-07-11 DIAGNOSIS — I5181 Takotsubo syndrome: Secondary | ICD-10-CM | POA: Diagnosis present

## 2014-07-11 DIAGNOSIS — T40601A Poisoning by unspecified narcotics, accidental (unintentional), initial encounter: Secondary | ICD-10-CM | POA: Diagnosis not present

## 2014-07-11 DIAGNOSIS — C50919 Malignant neoplasm of unspecified site of unspecified female breast: Secondary | ICD-10-CM | POA: Diagnosis not present

## 2014-07-11 DIAGNOSIS — Z809 Family history of malignant neoplasm, unspecified: Secondary | ICD-10-CM | POA: Diagnosis not present

## 2014-07-11 DIAGNOSIS — R579 Shock, unspecified: Secondary | ICD-10-CM | POA: Diagnosis not present

## 2014-07-11 DIAGNOSIS — Z515 Encounter for palliative care: Secondary | ICD-10-CM | POA: Diagnosis not present

## 2014-07-11 DIAGNOSIS — I2510 Atherosclerotic heart disease of native coronary artery without angina pectoris: Secondary | ICD-10-CM | POA: Diagnosis not present

## 2014-07-11 DIAGNOSIS — Z22322 Carrier or suspected carrier of Methicillin resistant Staphylococcus aureus: Secondary | ICD-10-CM | POA: Diagnosis not present

## 2014-07-11 DIAGNOSIS — M545 Low back pain: Secondary | ICD-10-CM | POA: Diagnosis present

## 2014-07-11 DIAGNOSIS — E876 Hypokalemia: Secondary | ICD-10-CM | POA: Diagnosis not present

## 2014-07-11 DIAGNOSIS — M797 Fibromyalgia: Secondary | ICD-10-CM | POA: Diagnosis present

## 2014-07-11 DIAGNOSIS — Z79899 Other long term (current) drug therapy: Secondary | ICD-10-CM | POA: Diagnosis not present

## 2014-07-11 DIAGNOSIS — Z9884 Bariatric surgery status: Secondary | ICD-10-CM | POA: Diagnosis not present

## 2014-07-11 DIAGNOSIS — I214 Non-ST elevation (NSTEMI) myocardial infarction: Secondary | ICD-10-CM | POA: Diagnosis not present

## 2014-07-11 DIAGNOSIS — J690 Pneumonitis due to inhalation of food and vomit: Secondary | ICD-10-CM | POA: Diagnosis not present

## 2014-07-11 DIAGNOSIS — I10 Essential (primary) hypertension: Secondary | ICD-10-CM | POA: Diagnosis not present

## 2014-07-11 DIAGNOSIS — I5021 Acute systolic (congestive) heart failure: Secondary | ICD-10-CM | POA: Diagnosis not present

## 2014-07-11 DIAGNOSIS — J90 Pleural effusion, not elsewhere classified: Secondary | ICD-10-CM | POA: Diagnosis present

## 2014-07-11 DIAGNOSIS — G894 Chronic pain syndrome: Secondary | ICD-10-CM | POA: Diagnosis not present

## 2014-07-11 DIAGNOSIS — R110 Nausea: Secondary | ICD-10-CM | POA: Diagnosis not present

## 2014-07-11 DIAGNOSIS — I9589 Other hypotension: Secondary | ICD-10-CM | POA: Diagnosis not present

## 2014-07-11 DIAGNOSIS — R570 Cardiogenic shock: Secondary | ICD-10-CM | POA: Diagnosis not present

## 2014-07-11 DIAGNOSIS — I959 Hypotension, unspecified: Secondary | ICD-10-CM | POA: Diagnosis not present

## 2014-07-11 DIAGNOSIS — R079 Chest pain, unspecified: Secondary | ICD-10-CM | POA: Diagnosis not present

## 2014-07-11 DIAGNOSIS — T8189XA Other complications of procedures, not elsewhere classified, initial encounter: Secondary | ICD-10-CM | POA: Diagnosis not present

## 2014-07-11 DIAGNOSIS — C50911 Malignant neoplasm of unspecified site of right female breast: Secondary | ICD-10-CM | POA: Diagnosis not present

## 2014-07-11 DIAGNOSIS — Z9013 Acquired absence of bilateral breasts and nipples: Secondary | ICD-10-CM | POA: Diagnosis present

## 2014-07-11 DIAGNOSIS — Z7189 Other specified counseling: Secondary | ICD-10-CM | POA: Diagnosis not present

## 2014-07-11 DIAGNOSIS — G9340 Encephalopathy, unspecified: Secondary | ICD-10-CM | POA: Diagnosis not present

## 2014-07-11 DIAGNOSIS — N183 Chronic kidney disease, stage 3 (moderate): Secondary | ICD-10-CM | POA: Diagnosis present

## 2014-07-11 DIAGNOSIS — Z853 Personal history of malignant neoplasm of breast: Secondary | ICD-10-CM | POA: Diagnosis not present

## 2014-07-11 DIAGNOSIS — Z66 Do not resuscitate: Secondary | ICD-10-CM | POA: Diagnosis not present

## 2014-07-11 DIAGNOSIS — Z452 Encounter for adjustment and management of vascular access device: Secondary | ICD-10-CM | POA: Diagnosis not present

## 2014-07-11 DIAGNOSIS — I420 Dilated cardiomyopathy: Secondary | ICD-10-CM | POA: Diagnosis not present

## 2014-07-11 DIAGNOSIS — R918 Other nonspecific abnormal finding of lung field: Secondary | ICD-10-CM | POA: Diagnosis not present

## 2014-07-11 DIAGNOSIS — K219 Gastro-esophageal reflux disease without esophagitis: Secondary | ICD-10-CM | POA: Diagnosis present

## 2014-07-11 DIAGNOSIS — R1013 Epigastric pain: Secondary | ICD-10-CM | POA: Diagnosis not present

## 2014-07-11 DIAGNOSIS — Z6837 Body mass index (BMI) 37.0-37.9, adult: Secondary | ICD-10-CM | POA: Diagnosis not present

## 2014-07-11 DIAGNOSIS — E119 Type 2 diabetes mellitus without complications: Secondary | ICD-10-CM

## 2014-07-11 DIAGNOSIS — F329 Major depressive disorder, single episode, unspecified: Secondary | ICD-10-CM | POA: Diagnosis present

## 2014-07-11 DIAGNOSIS — C787 Secondary malignant neoplasm of liver and intrahepatic bile duct: Secondary | ICD-10-CM | POA: Diagnosis not present

## 2014-07-11 DIAGNOSIS — I129 Hypertensive chronic kidney disease with stage 1 through stage 4 chronic kidney disease, or unspecified chronic kidney disease: Secondary | ICD-10-CM | POA: Diagnosis present

## 2014-07-11 DIAGNOSIS — I952 Hypotension due to drugs: Secondary | ICD-10-CM | POA: Diagnosis not present

## 2014-07-11 DIAGNOSIS — N179 Acute kidney failure, unspecified: Secondary | ICD-10-CM | POA: Diagnosis not present

## 2014-07-11 DIAGNOSIS — C50912 Malignant neoplasm of unspecified site of left female breast: Secondary | ICD-10-CM | POA: Diagnosis not present

## 2014-07-11 DIAGNOSIS — R0602 Shortness of breath: Secondary | ICD-10-CM | POA: Diagnosis present

## 2014-07-11 DIAGNOSIS — Y838 Other surgical procedures as the cause of abnormal reaction of the patient, or of later complication, without mention of misadventure at the time of the procedure: Secondary | ICD-10-CM | POA: Diagnosis present

## 2014-07-11 DIAGNOSIS — R531 Weakness: Secondary | ICD-10-CM | POA: Diagnosis not present

## 2014-07-11 LAB — COMPREHENSIVE METABOLIC PANEL
ALT: 63 U/L — AB (ref 0–35)
AST: 143 U/L — ABNORMAL HIGH (ref 0–37)
Albumin: 3.6 g/dL (ref 3.5–5.2)
Alkaline Phosphatase: 140 U/L — ABNORMAL HIGH (ref 39–117)
Anion gap: 14 (ref 5–15)
BILIRUBIN TOTAL: 1 mg/dL (ref 0.3–1.2)
BUN: 20 mg/dL (ref 6–23)
CHLORIDE: 107 mmol/L (ref 96–112)
CO2: 19 mmol/L (ref 19–32)
Calcium: 7.8 mg/dL — ABNORMAL LOW (ref 8.4–10.5)
Creatinine, Ser: 1.14 mg/dL — ABNORMAL HIGH (ref 0.50–1.10)
GFR calc Af Amer: 60 mL/min — ABNORMAL LOW (ref >90)
GFR calc non Af Amer: 52 mL/min — ABNORMAL LOW (ref >90)
GLUCOSE: 219 mg/dL — AB (ref 70–99)
Potassium: 4.6 mmol/L (ref 3.5–5.1)
SODIUM: 140 mmol/L (ref 135–145)
Total Protein: 7.5 g/dL (ref 6.0–8.3)

## 2014-07-11 LAB — URINALYSIS, ROUTINE W REFLEX MICROSCOPIC
BILIRUBIN URINE: NEGATIVE
GLUCOSE, UA: NEGATIVE mg/dL
Hgb urine dipstick: NEGATIVE
Ketones, ur: NEGATIVE mg/dL
Nitrite: NEGATIVE
Protein, ur: NEGATIVE mg/dL
Specific Gravity, Urine: 1.023 (ref 1.005–1.030)
Urobilinogen, UA: 1 mg/dL (ref 0.0–1.0)
pH: 6 (ref 5.0–8.0)

## 2014-07-11 LAB — CBC
HEMATOCRIT: 35.4 % — AB (ref 36.0–46.0)
HEMOGLOBIN: 12.1 g/dL (ref 12.0–15.0)
MCH: 32.5 pg (ref 26.0–34.0)
MCHC: 34.2 g/dL (ref 30.0–36.0)
MCV: 95.2 fL (ref 78.0–100.0)
Platelets: 244 10*3/uL (ref 150–400)
RBC: 3.72 MIL/uL — ABNORMAL LOW (ref 3.87–5.11)
RDW: 13.5 % (ref 11.5–15.5)
WBC: 12.4 10*3/uL — AB (ref 4.0–10.5)

## 2014-07-11 LAB — URINE MICROSCOPIC-ADD ON

## 2014-07-11 LAB — BRAIN NATRIURETIC PEPTIDE: B NATRIURETIC PEPTIDE 5: 3001.6 pg/mL — AB (ref 0.0–100.0)

## 2014-07-11 LAB — APTT: aPTT: 29 seconds (ref 24–37)

## 2014-07-11 LAB — CK TOTAL AND CKMB (NOT AT ARMC)
CK TOTAL: 926 U/L — AB (ref 7–177)
CK, MB: 79.6 ng/mL (ref 0.3–4.0)
Relative Index: 8.6 — ABNORMAL HIGH (ref 0.0–2.5)

## 2014-07-11 LAB — TROPONIN I
Troponin I: 16.91 ng/mL (ref <0.031)
Troponin I: 18.76 ng/mL (ref <0.031)

## 2014-07-11 LAB — PROTIME-INR
INR: 1.21 (ref 0.00–1.49)
PROTHROMBIN TIME: 15.4 s — AB (ref 11.6–15.2)

## 2014-07-11 LAB — GLUCOSE, CAPILLARY: GLUCOSE-CAPILLARY: 129 mg/dL — AB (ref 70–99)

## 2014-07-11 LAB — TSH: TSH: 0.017 u[IU]/mL — ABNORMAL LOW (ref 0.350–4.500)

## 2014-07-11 MED ORDER — MORPHINE SULFATE 2 MG/ML IJ SOLN
2.0000 mg | INTRAMUSCULAR | Status: DC | PRN
  Administered 2014-07-11 – 2014-07-12 (×7): 2 mg via INTRAVENOUS
  Filled 2014-07-11 (×7): qty 1

## 2014-07-11 MED ORDER — HEPARIN (PORCINE) IN NACL 100-0.45 UNIT/ML-% IJ SOLN
1250.0000 [IU]/h | INTRAMUSCULAR | Status: DC
  Administered 2014-07-11: 1250 [IU]/h via INTRAVENOUS
  Filled 2014-07-11 (×2): qty 250

## 2014-07-11 MED ORDER — NITROGLYCERIN 0.4 MG SL SUBL: 0.4000 mg | SUBLINGUAL_TABLET | SUBLINGUAL | Status: DC | PRN

## 2014-07-11 MED ORDER — FUROSEMIDE 20 MG PO TABS
20.0000 mg | ORAL_TABLET | Freq: Every day | ORAL | Status: DC
  Administered 2014-07-11 – 2014-07-12 (×2): 20 mg via ORAL
  Filled 2014-07-11 (×2): qty 1

## 2014-07-11 MED ORDER — PREGABALIN 100 MG PO CAPS
200.0000 mg | ORAL_CAPSULE | Freq: Three times a day (TID) | ORAL | Status: DC
  Administered 2014-07-11 – 2014-07-13 (×7): 200 mg via ORAL
  Filled 2014-07-11 (×2): qty 2
  Filled 2014-07-11: qty 4
  Filled 2014-07-11 (×5): qty 2

## 2014-07-11 MED ORDER — PIPERACILLIN-TAZOBACTAM 3.375 G IVPB
3.3750 g | Freq: Three times a day (TID) | INTRAVENOUS | Status: DC
  Filled 2014-07-11 (×2): qty 50

## 2014-07-11 MED ORDER — LEVOTHYROXINE SODIUM 150 MCG PO TABS
300.0000 ug | ORAL_TABLET | Freq: Every day | ORAL | Status: DC
  Administered 2014-07-12 – 2014-07-18 (×7): 300 ug via ORAL
  Filled 2014-07-11 (×11): qty 2

## 2014-07-11 MED ORDER — METHOCARBAMOL 750 MG PO TABS
750.0000 mg | ORAL_TABLET | Freq: Four times a day (QID) | ORAL | Status: DC
  Administered 2014-07-11 – 2014-07-17 (×21): 750 mg via ORAL
  Filled 2014-07-11: qty 1
  Filled 2014-07-11: qty 2
  Filled 2014-07-11 (×31): qty 1

## 2014-07-11 MED ORDER — OXYCODONE HCL ER 20 MG PO T12A
20.0000 mg | EXTENDED_RELEASE_TABLET | Freq: Two times a day (BID) | ORAL | Status: DC
  Administered 2014-07-11 – 2014-07-12 (×4): 20 mg via ORAL
  Filled 2014-07-11 (×4): qty 1

## 2014-07-11 MED ORDER — METOPROLOL TARTRATE 1 MG/ML IV SOLN
5.0000 mg | Freq: Four times a day (QID) | INTRAVENOUS | Status: DC
  Administered 2014-07-11 – 2014-07-12 (×4): 5 mg via INTRAVENOUS
  Filled 2014-07-11 (×6): qty 5

## 2014-07-11 MED ORDER — MORPHINE SULFATE 2 MG/ML IJ SOLN
INTRAMUSCULAR | Status: AC
  Administered 2014-07-11: 2 mg via INTRAVENOUS
  Filled 2014-07-11: qty 1

## 2014-07-11 MED ORDER — ASPIRIN 81 MG PO CHEW
CHEWABLE_TABLET | ORAL | Status: AC
  Administered 2014-07-11: 324 mg
  Filled 2014-07-11: qty 4

## 2014-07-11 MED ORDER — METHYLPHENIDATE HCL 5 MG PO TABS
5.0000 mg | ORAL_TABLET | Freq: Two times a day (BID) | ORAL | Status: DC
  Administered 2014-07-11 – 2014-07-12 (×3): 5 mg via ORAL
  Filled 2014-07-11 (×4): qty 1

## 2014-07-11 MED ORDER — INSULIN ASPART 100 UNIT/ML ~~LOC~~ SOLN
0.0000 [IU] | Freq: Three times a day (TID) | SUBCUTANEOUS | Status: DC
  Administered 2014-07-11 – 2014-07-16 (×6): 1 [IU] via SUBCUTANEOUS

## 2014-07-11 MED ORDER — TIZANIDINE HCL 4 MG PO TABS
4.0000 mg | ORAL_TABLET | Freq: Four times a day (QID) | ORAL | Status: DC | PRN
  Administered 2014-07-12: 4 mg via ORAL
  Filled 2014-07-11 (×3): qty 1

## 2014-07-11 MED ORDER — FUROSEMIDE 10 MG/ML IJ SOLN
40.0000 mg | Freq: Once | INTRAMUSCULAR | Status: AC
  Administered 2014-07-11: 40 mg via INTRAVENOUS
  Filled 2014-07-11 (×2): qty 4

## 2014-07-11 MED ORDER — HYDROCHLOROTHIAZIDE 25 MG PO TABS
25.0000 mg | ORAL_TABLET | Freq: Every day | ORAL | Status: DC
  Administered 2014-07-11 – 2014-07-12 (×2): 25 mg via ORAL
  Filled 2014-07-11 (×3): qty 1

## 2014-07-11 MED ORDER — PIPERACILLIN-TAZOBACTAM 3.375 G IVPB 30 MIN
3.3750 g | Freq: Once | INTRAVENOUS | Status: AC
  Administered 2014-07-11: 3.375 g via INTRAVENOUS
  Filled 2014-07-11: qty 50

## 2014-07-11 MED ORDER — HEPARIN SODIUM (PORCINE) 5000 UNIT/ML IJ SOLN
5000.0000 [IU] | Freq: Three times a day (TID) | INTRAMUSCULAR | Status: DC
  Filled 2014-07-11: qty 1

## 2014-07-11 MED ORDER — CARVEDILOL 3.125 MG PO TABS
3.1250 mg | ORAL_TABLET | Freq: Two times a day (BID) | ORAL | Status: DC
  Administered 2014-07-11 – 2014-07-12 (×3): 3.125 mg via ORAL
  Filled 2014-07-11 (×6): qty 1

## 2014-07-11 MED ORDER — MORPHINE SULFATE 2 MG/ML IJ SOLN
1.0000 mg | Freq: Once | INTRAMUSCULAR | Status: AC
  Administered 2014-07-11: 1 mg via INTRAVENOUS

## 2014-07-11 MED ORDER — LEVOFLOXACIN IN D5W 750 MG/150ML IV SOLN
750.0000 mg | INTRAVENOUS | Status: DC
  Administered 2014-07-11: 750 mg via INTRAVENOUS
  Filled 2014-07-11: qty 150

## 2014-07-11 MED ORDER — PANTOPRAZOLE SODIUM 40 MG IV SOLR
40.0000 mg | Freq: Every day | INTRAVENOUS | Status: DC
  Administered 2014-07-11 – 2014-07-12 (×2): 40 mg via INTRAVENOUS
  Filled 2014-07-11 (×4): qty 40

## 2014-07-11 MED ORDER — ASPIRIN 300 MG RE SUPP
300.0000 mg | RECTAL | Status: AC
  Filled 2014-07-11: qty 1

## 2014-07-11 MED ORDER — SODIUM CHLORIDE 0.9 % IV SOLN: INTRAVENOUS | Status: DC | PRN

## 2014-07-11 MED ORDER — SODIUM CHLORIDE 0.9 % IV SOLN
INTRAVENOUS | Status: DC
  Administered 2014-07-11: 14:00:00 via INTRAVENOUS

## 2014-07-11 MED ORDER — SODIUM CHLORIDE 0.9 % IV SOLN: 250.0000 mL | INTRAVENOUS | Status: DC | PRN

## 2014-07-11 MED ORDER — ALBUTEROL SULFATE (2.5 MG/3ML) 0.083% IN NEBU: 3.0000 mL | INHALATION_SOLUTION | RESPIRATORY_TRACT | Status: DC | PRN

## 2014-07-11 MED ORDER — FLUOXETINE HCL 20 MG PO CAPS
40.0000 mg | ORAL_CAPSULE | Freq: Every day | ORAL | Status: DC
  Administered 2014-07-12 – 2014-07-17 (×6): 40 mg via ORAL
  Filled 2014-07-11 (×12): qty 2

## 2014-07-11 MED ORDER — LOSARTAN POTASSIUM 50 MG PO TABS
100.0000 mg | ORAL_TABLET | Freq: Every day | ORAL | Status: DC
  Administered 2014-07-11: 100 mg via ORAL
  Filled 2014-07-11: qty 2

## 2014-07-11 MED ORDER — LOSARTAN POTASSIUM-HCTZ 100-25 MG PO TABS: 1.0000 | ORAL_TABLET | Freq: Every day | ORAL | Status: DC

## 2014-07-11 MED ORDER — ASPIRIN 81 MG PO CHEW: 324.0000 mg | CHEWABLE_TABLET | ORAL | Status: AC

## 2014-07-11 NOTE — Progress Notes (Signed)
Patient ID: Kerri Vasquez, female   DOB: [birth year], 59 y.o.   MRN: [id number]   Referring Physician(s): Ennever  Subjective:  Day1 post liver lesion MWA. Lots of pain overnight, epigastric, persisent, since procedure. Partially controlled with PCA. Some nausea. Had a little breakfast this AM.  Allergies: Influenza vac split; Zostavax; and Adhesive  Medications: Prior to Admission medications   Medication Sig Start Date End Date Taking? Authorizing Provider  acetaminophen (TYLENOL) 500 MG tablet Take 1,000 mg by mouth every 4 (four) hours as needed for moderate pain.   Yes Historical Provider, MD  FLUoxetine (PROZAC) 40 MG capsule Take 40 mg by mouth daily before breakfast.    Yes Historical Provider, MD  fulvestrant (FASLODEX) 250 MG/5ML injection Inject 250 mg into the muscle every 30 (thirty) days. One injection each buttock over 1-2 minutes. Warm prior to use.   Yes Historical Provider, MD  furosemide (LASIX) 20 MG tablet TAKE 1 TABLET BY MOUTH EVERY DAY Patient taking differently: Take one tablet by mouth every mornig 02/04/14  Yes Volanda Napoleon, MD  levothyroxine (SYNTHROID, LEVOTHROID) 300 MCG tablet Take 300 mcg by mouth daily before breakfast.   Yes Historical Provider, MD  losartan-hydrochlorothiazide (HYZAAR) 100-25 MG per tablet Take 1 tablet by mouth every morning.    Yes Historical Provider, MD  methocarbamol (ROBAXIN) 750 MG tablet Take 750 mg by mouth 4 (four) times daily.   Yes Historical Provider, MD  methylphenidate (RITALIN) 5 MG tablet Take 1 tablet (5 mg total) by mouth 2 (two) times daily. Patient taking differently: Take 5 mg by mouth every morning.  05/14/14  Yes Volanda Napoleon, MD  oxyCODONE (OXY IR/ROXICODONE) 5 MG immediate release tablet Take 5 mg by mouth every 4 (four) hours as needed (For pain.).   Yes Historical Provider, MD  OxyCODONE (OXYCONTIN) 20 mg T12A 12 hr tablet Take 20 mg by mouth every 12 (twelve) hours.   Yes Historical Provider, MD    oxyCODONE-acetaminophen (PERCOCET) 10-325 MG per tablet Take 1 tablet by mouth every 6 (six) hours as needed for pain. Patient taking differently: Take 1 tablet by mouth every 4 (four) hours as needed for pain.  01/05/14  Yes Virgel Manifold, MD  palbociclib [facility]) 100 MG capsule Take 1 pill a day for 21 days on and 7 days off.  Take with food 05/14/14  Yes Volanda Napoleon, MD  pregabalin (LYRICA) 200 MG capsule Take 200 mg by mouth 3 (three) times daily.   Yes Historical Provider, MD  promethazine (PHENERGAN) 25 MG tablet Take 1 tablet (25 mg total) by mouth every 8 (eight) hours as needed for nausea or vomiting. Patient taking differently: Take 25 mg by mouth every 6 (six) hours as needed for nausea or vomiting.  11/25/13  Yes Volanda Napoleon, MD  temazepam (RESTORIL) 15 MG capsule TAKE 1 CAPSULE BY MOUTH EVERY DAY AT BEDTIME AS NEEDED FOR SLEEP Patient taking differently: TAKE 1 CAPSULE BY MOUTH EVERY DAY AT BEDTIME AS NEEDED FOR SLEEP////07/07/14 per daughter may be repeated x 1 06/30/14  Yes Volanda Napoleon, MD  tiZANidine (ZANAFLEX) 4 MG tablet Take 4 mg by mouth every 6 (six) hours as needed for muscle spasms.   Yes Historical Provider, MD  potassium chloride (K-DUR) 10 MEQ tablet Take 4 tablets (40 mEq total) by mouth daily. Patient not taking: Reported on 06/29/2014 05/27/14   Volanda Napoleon, MD    Review of Systems  Vital Signs: BP 130/92 mmHg  Pulse 128  Temp(Src) 98.2 F (36.8 C) (Oral)  Resp 20  Ht 5\' 6"  (1.676 m)  Wt 249 lb (112.946 kg)  BMI 40.21 kg/m2  SpO2 94%  LMP 08/30/2006  Physical Exam  Constitutional: She appears distressed.  Abdominal: Soft. She exhibits no distension. There is no tenderness. There is no rebound and no guarding.  Vitals reviewed.   Imaging: Ct Guide Tissue Ablation  07/10/2014   EXAM: IR CT GUIDED TISSUE ABLATION  CT-GUIDED CORE LIVER LESION BIOPSY  Clinical history:  59 year old female with a history of right breast cancer diagnosed 7 years ago.  She is status post bilateral mastectomy. Previous percutaneous microwave ablation of solitary medial left hepatic lobe metastasis 01/26/2012. Most recent PET-CT demonstrated new hypermetabolic focus near the ablation zone. She presents today for percutaneous ablation of her hepatic metastatic disease.  Sedation:  General anesthesia was provided by the anesthesiology service  CONTRAST:  None provided  PROCEDURE: Informed consent was obtained from the patient following explanation of the procedure, risks, benefits and alternatives. The patient understands, agrees and consents for the procedure. All questions were addressed. A time out was performed.  A planning triple phase contrast CT scan was performed to formally assess for additional metastatic disease. The solitary lesion in hepatic segment 4A is again identified. The lesion is minimally changed from the most recent PET-CT. No new metastatic disease is identified. The patient is status post prior cholecystectomy. The visualized spleen, pancreas, kidneys and adrenal glands are unremarkable. The patient is status post surgical changes of gastric bypass surgery. There is a small midline abdominal ventral hernia containing  small bowel. Right breast prosthesis partially visualized. Dependent atelectasis in the visualized lung bases.  Maximal barrier sterile technique utilized including caps, mask, sterile gowns, sterile gloves, large sterile drape, hand hygiene, and betadine skin prep.  Under CT fluoroscopic guidance, a 17 gauge trocar needle was advanced to the margin of the lesion. Once needle tip position was confirmed, coaxial 18-gauge core biopsy samples were obtained, submitted in formalin to surgical pathology. The guide needle was removed. Using CT fluoroscopic technique, a 20 cm 3.7 cm active tip Covidien microwave antenna was then advanced percutaneously into the lesion. Following axial CT confirmation of appropriate antenna placement, thermal ablation was  conducted at 45 watts for 10 minutes. Following the successful ablation protocol, the tract was ablated as the antenna removed. Sterile bandage was applied.  Post ablation CT imaging demonstrated expected changes in the ablations on at the site of the prior lesion. No significant intraperitoneal or perihepatic hemorrhage. No pneumothorax. The patient tolerated the procedure well. There is no immediate complication. She was taken to the PACU where she was extubated.  IMPRESSION:  1. Technically successful core biopsy of solitary hepatic segment 4A lesion. 2. Technically successful CT guided microwave ablation of recurrent solitary hepatic metastasis.   Electronically Signed   By: Lucrezia Europe M.D.   On: 07/10/2014 16:48   Ct Biopsy  07/10/2014   EXAM: IR CT GUIDED TISSUE ABLATION  CT-GUIDED CORE LIVER LESION BIOPSY  Clinical history:  59 year old female with a history of right breast cancer diagnosed 7 years ago. She is status post bilateral mastectomy. Previous percutaneous microwave ablation of solitary medial left hepatic lobe metastasis 01/26/2012. Most recent PET-CT demonstrated new hypermetabolic focus near the ablation zone. She presents today for percutaneous ablation of her hepatic metastatic disease.  Sedation:  General anesthesia was provided by the anesthesiology service  CONTRAST:  None provided  PROCEDURE: Informed consent was obtained from the  patient following explanation of the procedure, risks, benefits and alternatives. The patient understands, agrees and consents for the procedure. All questions were addressed. A time out was performed.  A planning triple phase contrast CT scan was performed to formally assess for additional metastatic disease. The solitary lesion in hepatic segment 4A is again identified. The lesion is minimally changed from the most recent PET-CT. No new metastatic disease is identified. The patient is status post prior cholecystectomy. The visualized spleen, pancreas, kidneys  and adrenal glands are unremarkable. The patient is status post surgical changes of gastric bypass surgery. There is a small midline abdominal ventral hernia containing  small bowel. Right breast prosthesis partially visualized. Dependent atelectasis in the visualized lung bases.  Maximal barrier sterile technique utilized including caps, mask, sterile gowns, sterile gloves, large sterile drape, hand hygiene, and betadine skin prep.  Under CT fluoroscopic guidance, a 17 gauge trocar needle was advanced to the margin of the lesion. Once needle tip position was confirmed, coaxial 18-gauge core biopsy samples were obtained, submitted in formalin to surgical pathology. The guide needle was removed. Using CT fluoroscopic technique, a 20 cm 3.7 cm active tip Covidien microwave antenna was then advanced percutaneously into the lesion. Following axial CT confirmation of appropriate antenna placement, thermal ablation was conducted at 45 watts for 10 minutes. Following the successful ablation protocol, the tract was ablated as the antenna removed. Sterile bandage was applied.  Post ablation CT imaging demonstrated expected changes in the ablations on at the site of the prior lesion. No significant intraperitoneal or perihepatic hemorrhage. No pneumothorax. The patient tolerated the procedure well. There is no immediate complication. She was taken to the PACU where she was extubated.  IMPRESSION:  1. Technically successful core biopsy of solitary hepatic segment 4A lesion. 2. Technically successful CT guided microwave ablation of recurrent solitary hepatic metastasis.   Electronically Signed   By: Lucrezia Europe M.D.   On: 07/10/2014 16:48    Labs:  CBC:  Recent Labs  05/14/14 1225 06/29/14 0824 07/07/14 1005 07/10/14 1110  WBC 5.1 3.6* 4.8 4.8  HGB 10.6* 10.4* 9.4* 11.3*  HCT 33.2* 31.6* 28.9* 33.5*  PLT 188 145 184 203    COAGS:  Recent Labs  07/07/14 1005  INR 1.11  APTT 36    BMP:  Recent Labs   07/26/13 1630  06/29/14 0824 07/07/14 1005 07/10/14 1110 07/11/14 0440  NA 140  < > 144 141 140 140  K 4.0  < > 3.6 4.3 3.8 4.6  CL 104  < > 101 110 109 107  CO2 22  < > 28 23 23 19   GLUCOSE 77  < > 80 89 99 219*  BUN 14  < > 30* 36* 21 20  CALCIUM 9.5  < > 9.5 6.5* 7.6* 7.8*  CREATININE 0.80  < > 1.1 1.22* 1.00 1.14*  GFRNONAA 80*  --   --  48* 60* 52*  GFRAA >90  --   --  55* 70* 60*  < > = values in this interval not displayed.  LIVER FUNCTION TESTS:  Recent Labs  03/18/14 0821  05/14/14 1225 06/29/14 0824 07/07/14 1005 07/11/14 0440  BILITOT 0.3  < > 0.3 0.70 0.5 1.0  AST 12  < > 13 27 23  143*  ALT 10  < > 10 31 17  63*  ALKPHOS 80  < > 88 101* 89 140*  PROT 6.3  < > 6.4 7.2 7.1 7.5  ALBUMIN 3.5  --  3.4*  --  3.6 3.6  < > = values in this interval not displayed.  Assessment and Plan:  Atypical pain 1d post liver MWA. Suspect transient capsular irritation but will check CT abd to r/o bleed or other unexpected pathology.   May need to stay another day if sx persist through lunch.   I spent a total of 15 minutes face to face in clinical consultation/evaluation, greater than 50% of which was counseling/coordinating care for pain post liver ablation.  Signed: Sabryna Lahm III, DAYNE Dalani Mette 07/11/2014, 10:21 AM

## 2014-07-11 NOTE — Progress Notes (Signed)
Dear Doctor:Feinstein, This patient has been identified as a candidate for picc line for the following reason (s): poor veins.  If you agree, please write an order for the indicated device. For any questions contact the Vascular Access Team at [phone] if no answer, please leave a message.  Thank you for supporting the early vascular access assessment program.

## 2014-07-11 NOTE — Progress Notes (Addendum)
1 Day Post-Op  Subjective: Pt c/o substernal CP,dyspnea, intermittent N/V which she says began last night; also some soreness in RUQ at prior bx/ablation site  Objective: Vital signs in last 24 hours: Temp:  [97.5 F (36.4 C)-98.6 F (37 C)] 98.2 F (36.8 C) (03/12 0534) Pulse Rate:  [92-138] 128 (03/12 0534) Resp:  [10-20] 18 (03/12 1116) BP: (91-163)/(60-93) 130/92 mmHg (03/12 0534) SpO2:  [27 %-98 %] 27 % (03/12 1116) FiO2 (%):  [94 %] 94 % (03/12 1116) Last BM Date: 07/09/14  Intake/Output from previous day: 03/11 0701 - 03/12 0700 In: 2000 [I.V.:2000] Out: 1200 [Urine:1200] Intake/Output this shift:    Pt appears weak, chest- dim BS bases; heart - tachy but regular; abd- obese, soft, gen tenderness to palpation, RUQ puncture site clean and dry, no def hematoma  Lab Results:   Recent Labs  07/10/14 1110  WBC 4.8  HGB 11.3*  HCT 33.5*  PLT 203   BMET  Recent Labs  07/10/14 1110 07/11/14 0440  NA 140 140  K 3.8 4.6  CL 109 107  CO2 23 19  GLUCOSE 99 219*  BUN 21 20  CREATININE 1.00 1.14*  CALCIUM 7.6* 7.8*   PT/INR No results for input(s): LABPROT, INR in the last 72 hours. ABG No results for input(s): PHART, HCO3 in the last 72 hours.  Invalid input(s): PCO2, PO2  Studies/Results: Ct Abdomen Wo Contrast  07/11/2014   CLINICAL DATA:  Epigastric pain. Status post microwave ablation 1 day ago.  EXAM: CT ABDOMEN WITHOUT CONTRAST  TECHNIQUE: Multidetector CT imaging of the abdomen was performed following the standard protocol without IV contrast.  COMPARISON:  None.  FINDINGS: Lower chest: There are bilateral moderate pleural effusions. Interstitial thickening is identified within the lungs bilaterally consistent with edema. There are airspace opacities within both lower lobes.  Hepatobiliary: Along the falciform ligament there is a 2.3 cm focal area of low attenuation within the medial segment of left hepatic lobe compatible with ablation defect. Small  adjacent focus of hyperattenuation is identified on image number 19/series 2 which measures 1.4 cm. Previous cholecystectomy. The common bile duct is increased in caliber measuring up to 1.2 cm.  Pancreas: Negative  Spleen: Negative  Adrenals/Urinary Tract: The adrenal glands are normal. Again noted are bilateral lobulated kidneys. No obstructive uropathy or other acute finding.  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  Vascular/Lymphatic: Normal appearance of the abdominal aorta. No enlarged retroperitoneal or mesenteric adenopathy.  Other: Ventral abdominal wall hernia is identified which contains small and large bowel loops. The small bowel loops are mildly increased in caliber measuring up to 2.6 cm. There is perihepatic ascites which is new from previous exam.  Musculoskeletal: There are no aggressive lytic or sclerotic bone lesions. Solid fusion of the L2-3 vertebra noted. There is marked degenerative disc disease at the L3-4 level.  IMPRESSION: 1. No large acute hematoma identified to explain the patient's symptoms. There is a small amount of perihepatic fluid which appears low in attenuation. No complications at the ablation site. 2. Bilateral pleural effusions and interstitial edema consistent with CHF. 3. Bilateral lower lobe airspace opacities which may represent areas of alveolar edema secondary to CHF. Pneumonia not excluded. 4. Ventral abdominal wall hernia which contained loops of small and large bowel. Do small bowel loops within the hernia arm mildly increased in caliber measuring up to 2.6 cm.   Electronically Signed   By: Queen Slough.D.  On: 07/11/2014 11:09   Ct Guide Tissue Ablation  07/10/2014   EXAM: IR CT GUIDED TISSUE ABLATION  CT-GUIDED CORE LIVER LESION BIOPSY  Clinical history:  59 year old female with a history of right breast cancer diagnosed 7 years ago. She is status post bilateral mastectomy.  Previous percutaneous microwave ablation of solitary medial left hepatic lobe metastasis 01/26/2012. Most recent PET-CT demonstrated new hypermetabolic focus near the ablation zone. She presents today for percutaneous ablation of her hepatic metastatic disease.  Sedation:  General anesthesia was provided by the anesthesiology service  CONTRAST:  None provided  PROCEDURE: Informed consent was obtained from the patient following explanation of the procedure, risks, benefits and alternatives. The patient understands, agrees and consents for the procedure. All questions were addressed. A time out was performed.  A planning triple phase contrast CT scan was performed to formally assess for additional metastatic disease. The solitary lesion in hepatic segment 4A is again identified. The lesion is minimally changed from the most recent PET-CT. No new metastatic disease is identified. The patient is status post prior cholecystectomy. The visualized spleen, pancreas, kidneys and adrenal glands are unremarkable. The patient is status post surgical changes of gastric bypass surgery. There is a small midline abdominal ventral hernia containing  small bowel. Right breast prosthesis partially visualized. Dependent atelectasis in the visualized lung bases.  Maximal barrier sterile technique utilized including caps, mask, sterile gowns, sterile gloves, large sterile drape, hand hygiene, and betadine skin prep.  Under CT fluoroscopic guidance, a 17 gauge trocar needle was advanced to the margin of the lesion. Once needle tip position was confirmed, coaxial 18-gauge core biopsy samples were obtained, submitted in formalin to surgical pathology. The guide needle was removed. Using CT fluoroscopic technique, a 20 cm 3.7 cm active tip Covidien microwave antenna was then advanced percutaneously into the lesion. Following axial CT confirmation of appropriate antenna placement, thermal ablation was conducted at 45 watts for 10 minutes.  Following the successful ablation protocol, the tract was ablated as the antenna removed. Sterile bandage was applied.  Post ablation CT imaging demonstrated expected changes in the ablations on at the site of the prior lesion. No significant intraperitoneal or perihepatic hemorrhage. No pneumothorax. The patient tolerated the procedure well. There is no immediate complication. She was taken to the PACU where she was extubated.  IMPRESSION:  1. Technically successful core biopsy of solitary hepatic segment 4A lesion. 2. Technically successful CT guided microwave ablation of recurrent solitary hepatic metastasis.   Electronically Signed   By: Lucrezia Europe M.D.   On: 07/10/2014 16:48   Ct Biopsy  07/10/2014   EXAM: IR CT GUIDED TISSUE ABLATION  CT-GUIDED CORE LIVER LESION BIOPSY  Clinical history:  59 year old female with a history of right breast cancer diagnosed 7 years ago. She is status post bilateral mastectomy. Previous percutaneous microwave ablation of solitary medial left hepatic lobe metastasis 01/26/2012. Most recent PET-CT demonstrated new hypermetabolic focus near the ablation zone. She presents today for percutaneous ablation of her hepatic metastatic disease.  Sedation:  General anesthesia was provided by the anesthesiology service  CONTRAST:  None provided  PROCEDURE: Informed consent was obtained from the patient following explanation of the procedure, risks, benefits and alternatives. The patient understands, agrees and consents for the procedure. All questions were addressed. A time out was performed.  A planning triple phase contrast CT scan was performed to formally assess for additional metastatic disease. The solitary lesion in hepatic segment 4A is again identified. The lesion is  minimally changed from the most recent PET-CT. No new metastatic disease is identified. The patient is status post prior cholecystectomy. The visualized spleen, pancreas, kidneys and adrenal glands are unremarkable.  The patient is status post surgical changes of gastric bypass surgery. There is a small midline abdominal ventral hernia containing  small bowel. Right breast prosthesis partially visualized. Dependent atelectasis in the visualized lung bases.  Maximal barrier sterile technique utilized including caps, mask, sterile gowns, sterile gloves, large sterile drape, hand hygiene, and betadine skin prep.  Under CT fluoroscopic guidance, a 17 gauge trocar needle was advanced to the margin of the lesion. Once needle tip position was confirmed, coaxial 18-gauge core biopsy samples were obtained, submitted in formalin to surgical pathology. The guide needle was removed. Using CT fluoroscopic technique, a 20 cm 3.7 cm active tip Covidien microwave antenna was then advanced percutaneously into the lesion. Following axial CT confirmation of appropriate antenna placement, thermal ablation was conducted at 45 watts for 10 minutes. Following the successful ablation protocol, the tract was ablated as the antenna removed. Sterile bandage was applied.  Post ablation CT imaging demonstrated expected changes in the ablations on at the site of the prior lesion. No significant intraperitoneal or perihepatic hemorrhage. No pneumothorax. The patient tolerated the procedure well. There is no immediate complication. She was taken to the PACU where she was extubated.  IMPRESSION:  1. Technically successful core biopsy of solitary hepatic segment 4A lesion. 2. Technically successful CT guided microwave ablation of recurrent solitary hepatic metastasis.   Electronically Signed   By: Lucrezia Europe M.D.   On: 07/10/2014 16:48    Anti-infectives: Anti-infectives    Start     Dose/Rate Route Frequency Ordered Stop   07/10/14 1045  piperacillin-tazobactam (ZOSYN) IVPB 3.375 g     3.375 g 12.5 mL/hr over 240 Minutes Intravenous  Once 07/10/14 1032 07/10/14 1339      Assessment/Plan: S/p CT guided thermal ablation/biopsy of liver lesion (met  breast ca); now with CP,abd pain, N/V, dyspnea; f/u CT today revealed no large acute hematoma;  There was a small amount of perihepatic fluid which appeared low in attenuation. No complications at the ablation site; Marland Kitchen Bilateral pleural effusions and interstitial edema consistent with CHF.Bilateral lower lobe airspace opacities which may represent areas of alveolar edema secondary to CHF. Pneumonia not excluded.Ventral abdominal wall hernia which contained loops of small and large bowel; will check EKG, troponins, CXR, f/u CBC, consult [facility] for medical management.    15 minutes were spent eval pt post liver bx/thermal ablation [facility],D [employer] [facility] 07/11/2014

## 2014-07-11 NOTE — Progress Notes (Signed)
SUBJECTIVE:  The patient was transferred from [facility] with chest pain and positive enzymes.     PHYSICAL EXAM Filed Vitals:   07/11/14 2015 07/11/14 2023 07/11/14 2025 07/11/14 2200  BP: 80/65 112/75 97/73 86/63   Pulse: 120   117  Temp:      TempSrc:      Resp: 22   19  Height:      Weight:      SpO2: 98%   92%   General:  No distress Lungs:  Bilateral crackles3 Heart:  RRR Abdomen:  Positive bowel sounds, no rebound no guarding Extremities:  Mild diffuse edema.  LABS: Lab Results  Component Value Date   TROPONINI 18.76* 07/11/2014   Results for orders placed or performed during the hospital encounter of 07/10/14 (from the past 24 hour(s))  Comprehensive metabolic panel     Status: Abnormal   Collection Time: 07/11/14  4:40 AM  Result Value Ref Range   Sodium 140 135 - 145 mmol/L   Potassium 4.6 3.5 - 5.1 mmol/L   Chloride 107 96 - 112 mmol/L   CO2 19 19 - 32 mmol/L   Glucose, Bld 219 (H) 70 - 99 mg/dL   BUN 20 6 - 23 mg/dL   Creatinine, Ser 1.14 (H) 0.50 - 1.10 mg/dL   Calcium 7.8 (L) 8.4 - 10.5 mg/dL   Total Protein 7.5 6.0 - 8.3 g/dL   Albumin 3.6 3.5 - 5.2 g/dL   AST 143 (H) 0 - 37 U/L   ALT 63 (H) 0 - 35 U/L   Alkaline Phosphatase 140 (H) 39 - 117 U/L   Total Bilirubin 1.0 0.3 - 1.2 mg/dL   GFR calc non Af Amer 52 (L) >90 mL/min   GFR calc Af Amer 60 (L) >90 mL/min   Anion gap 14 5 - 15  Troponin I (q 6hr x 3)     Status: Abnormal   Collection Time: 07/11/14  1:40 PM  Result Value Ref Range   Troponin I 16.91 (HH) <0.031 ng/mL  CBC     Status: Abnormal   Collection Time: 07/11/14  1:40 PM  Result Value Ref Range   WBC 12.4 (H) 4.0 - 10.5 K/uL   RBC 3.72 (L) 3.87 - 5.11 MIL/uL   Hemoglobin 12.1 12.0 - 15.0 g/dL   HCT 35.4 (L) 36.0 - 46.0 %   MCV 95.2 78.0 - 100.0 fL   MCH 32.5 26.0 - 34.0 pg   MCHC 34.2 30.0 - 36.0 g/dL   RDW 13.5 11.5 - 15.5 %   Platelets 244 150 - 400 K/uL  Brain natriuretic peptide     Status: Abnormal   Collection Time:  07/11/14  1:40 PM  Result Value Ref Range   B Natriuretic Peptide 3001.6 (H) 0.0 - 100.0 pg/mL   CK total and CKMB (cardiac)     Status: Abnormal   Collection Time: 07/11/14  1:40 PM  Result Value Ref Range   Total CK 926 (H) 7 - 177 U/L   CK, MB 79.6 (HH) 0.3 - 4.0 ng/mL   Relative Index 8.6 (H) 0.0 - 2.5  Glucose, capillary     Status: Abnormal   Collection Time: 07/11/14  5:23 PM  Result Value Ref Range   Glucose-Capillary 129 (H) 70 - 99 mg/dL  Troponin I (q 6hr x 3)     Status: Abnormal   Collection Time: 07/11/14  6:42 PM  Result Value Ref Range   Troponin I 18.76 (HH) <0.031  ng/mL  TSH     Status: Abnormal   Collection Time: 07/11/14  6:42 PM  Result Value Ref Range   TSH 0.017 (L) 0.350 - 4.500 uIU/mL  Protime-INR     Status: Abnormal   Collection Time: 07/11/14  6:42 PM  Result Value Ref Range   Prothrombin Time 15.4 (H) 11.6 - 15.2 seconds   INR 1.21 0.00 - 1.49  APTT     Status: None   Collection Time: 07/11/14  6:42 PM  Result Value Ref Range   aPTT 29 24 - 37 seconds  Urinalysis, Routine w reflex microscopic     Status: Abnormal   Collection Time: 07/11/14  6:54 PM  Result Value Ref Range   Color, Urine AMBER (A) YELLOW   APPearance CLEAR CLEAR   Specific Gravity, Urine 1.023 1.005 - 1.030   pH 6.0 5.0 - 8.0   Glucose, UA NEGATIVE NEGATIVE mg/dL   Hgb urine dipstick NEGATIVE NEGATIVE   Bilirubin Urine NEGATIVE NEGATIVE   Ketones, ur NEGATIVE NEGATIVE mg/dL   Protein, ur NEGATIVE NEGATIVE mg/dL   Urobilinogen, UA 1.0 0.0 - 1.0 mg/dL   Nitrite NEGATIVE NEGATIVE   Leukocytes, UA SMALL (A) NEGATIVE  Urine microscopic-add on     Status: Abnormal   Collection Time: 07/11/14  6:54 PM  Result Value Ref Range   Squamous Epithelial / LPF RARE RARE   WBC, UA 7-10 <3 WBC/hpf   RBC / HPF 0-2 <3 RBC/hpf   Bacteria, UA MANY (A) RARE    Intake/Output Summary (Last 24 hours) at 07/11/14 2256 Last data filed at 07/11/14 1857  Gross per 24 hour  Intake   62.5 ml    Output    750 ml  Net -687.5 ml    ASSESSMENT AND PLAN:  Chest pain:  Pain has been going on for about 36 hours and currently is less severe than previous.  She has no acute ST elevation.  I did review the echo and she has severely reduced EF with predominantly global hypokinesis.  The RV function is reduced as well.  I spoke with her at length.  Her pain has a somewhat pleuritic component now although she did have an apparent NSTEMI.  At this point I will manage her medically as this diagnosis is now late after the onset of symptoms.  We will continue to manage for acute systolic HF.  BP will not allow med titration.  Continue pain control.  Morphine helped.   Dr. Wynonia Lawman did talk with interventional radiology and they suggested that we could use heparin.  I will ask radiology to start without a bolus.  She is on ASA.  We will continue beta blocker as her BP tolerates.  She received Lasix already tonight.    Minus Breeding 07/11/2014 10:56 PM

## 2014-07-11 NOTE — Progress Notes (Signed)
[facility] loading pt. Report given with updates PW given to paramedics. Suitcase and cane with personal items sent with pt.

## 2014-07-11 NOTE — Anesthesia Postprocedure Evaluation (Signed)
Anesthesia Post Note  Patient: Kerri Vasquez  Procedure(s) Performed: Procedure(s) (LRB): THERMAL ABLATION OF LIVER     (N/A)  Anesthesia type: General  Patient location: PACU  Post pain: Pain level controlled  Post assessment: Post-op Vital signs reviewed  Last Vitals: BP 101/76 mmHg  Pulse 120  Temp(Src) 36.5 C (Oral)  Resp 22  Ht 5\' 6"  (1.676 m)  Wt 242 lb 15.2 oz (110.2 kg)  BMI 39.23 kg/m2  SpO2 98%  LMP 08/30/2006  Post vital signs: Reviewed  Level of consciousness: sedated  Complications: No apparent anesthesia complications

## 2014-07-11 NOTE — Progress Notes (Signed)
ANTICOAGULATION CONSULT NOTE - Initial Consult  Pharmacy Consult for heparin Indication: NSTEMI  Allergies  Allergen Reactions  . Influenza Vac Split [Flu Virus Vaccine] Other (See Comments)    Joint stiffness and renal failure  . Zostavax [Zoster Vaccine Live ([employer])] Other (See Comments)    Joint stiffness, renal failure  . Adhesive [Tape] Itching and Rash    Patient Measurements: Height: 5\' 6"  (167.6 cm) Weight: 242 lb 15.2 oz (110.2 kg) IBW/kg (Calculated) : 59.3 Heparin Dosing Weight: 90kg  Vital Signs: Temp: 97.7 F (36.5 C) (03/12 1641) Temp Source: Oral (03/12 1641) BP: 86/63 mmHg (03/12 2200) Pulse Rate: 117 (03/12 2200)  Labs:  Recent Labs  07/10/14 1110 07/11/14 0440 07/11/14 1340 07/11/14 1842  HGB 11.3*  --  12.1  --   HCT 33.5*  --  35.4*  --   PLT 203  --  244  --   APTT  --   --   --  29  LABPROT  --   --   --  15.4*  INR  --   --   --  1.21  CREATININE 1.00 1.14*  --   --   CKTOTAL  --   --  926*  --   CKMB  --   --  79.6*  --   TROPONINI  --   --  16.91* 18.76*    Estimated Creatinine Clearance: 66.9 mL/min (by C-G formula based on Cr of 1.14).   Medical History: Past Medical History  Diagnosis Date  . Depression   . Hypertension   . Migraine   . Hypothyroidism   . Incisional hernia   . Spinal stenosis   . Scoliosis   . Sciatica   . Intestinal obstruction   . GERD (gastroesophageal reflux disease)   . Septic arthritis 01/28/10    and spinal stenosis , moderate scoliosis   . Osteoarthritis of multiple joints   . Anemia   . Pernicious anemia 05/30/2011    Iron transfusion and VB12 on 06/06/11  . Sleep apnea     hx of off machine x 4 years   . Diabetes mellitus     type II, controlled by diet 05/25/11   . Renal insufficiency     hx renal failure 2011  . Fibromyalgia   . Fracture of multiple toes     right toes x 4 toes- excluding small toe    Medications:  Prescriptions prior to admission  Medication Sig Dispense Refill  Last Dose  . acetaminophen (TYLENOL) 500 MG tablet Take 1,000 mg by mouth every 4 (four) hours as needed for moderate pain.   07/06/2014  . FLUoxetine (PROZAC) 40 MG capsule Take 40 mg by mouth daily before breakfast.    07/09/2014 at Unknown time  . fulvestrant (FASLODEX) 250 MG/5ML injection Inject 250 mg into the muscle every 30 (thirty) days. One injection each buttock over 1-2 minutes. Warm prior to use.   06/29/2014  . furosemide (LASIX) 20 MG tablet TAKE 1 TABLET BY MOUTH EVERY DAY (Patient taking differently: Take one tablet by mouth every mornig) 90 tablet 0 07/09/2014 at Unknown time  . levothyroxine (SYNTHROID, LEVOTHROID) 300 MCG tablet Take 300 mcg by mouth daily before breakfast.   07/09/2014 at Unknown time  . losartan-hydrochlorothiazide (HYZAAR) 100-25 MG per tablet Take 1 tablet by mouth every morning.    07/09/2014 at Unknown time  . methocarbamol (ROBAXIN) 750 MG tablet Take 750 mg by mouth 4 (four) times daily.   07/09/2014  at Unknown time  . methylphenidate (RITALIN) 5 MG tablet Take 1 tablet (5 mg total) by mouth 2 (two) times daily. (Patient taking differently: Take 5 mg by mouth every morning. ) 60 tablet 0 07/09/2014 at Unknown time  . oxyCODONE (OXY IR/ROXICODONE) 5 MG immediate release tablet Take 5 mg by mouth every 4 (four) hours as needed (For pain.).   07/09/2014 at 2300  . OxyCODONE (OXYCONTIN) 20 mg T12A 12 hr tablet Take 20 mg by mouth every 12 (twelve) hours.   07/09/2014 at 2300  . oxyCODONE-acetaminophen (PERCOCET) 10-325 MG per tablet Take 1 tablet by mouth every 6 (six) hours as needed for pain. (Patient taking differently: Take 1 tablet by mouth every 4 (four) hours as needed for pain. ) 20 tablet 0 07/09/2014 at Unknown time  . palbociclib (IBRANCE) 100 MG capsule Take 1 pill a day for 21 days on and 7 days off.  Take with food 21 capsule 6 07/09/2014 at Unknown time  . pregabalin (LYRICA) 200 MG capsule Take 200 mg by mouth 3 (three) times daily.   07/09/2014 at Unknown time   . promethazine (PHENERGAN) 25 MG tablet Take 1 tablet (25 mg total) by mouth every 8 (eight) hours as needed for nausea or vomiting. (Patient taking differently: Take 25 mg by mouth every 6 (six) hours as needed for nausea or vomiting. ) 30 tablet 2 07/09/2014 at Unknown time  . temazepam (RESTORIL) 15 MG capsule TAKE 1 CAPSULE BY MOUTH EVERY DAY AT BEDTIME AS NEEDED FOR SLEEP (Patient taking differently: TAKE 1 CAPSULE BY MOUTH EVERY DAY AT BEDTIME AS NEEDED FOR SLEEP////07/07/14 per daughter may be repeated x 1) 30 capsule 0 07/09/2014 at Unknown time  . tiZANidine (ZANAFLEX) 4 MG tablet Take 4 mg by mouth every 6 (six) hours as needed for muscle spasms.   07/09/2014 at Unknown time  . potassium chloride (K-DUR) 10 MEQ tablet Take 4 tablets (40 mEq total) by mouth daily. (Patient not taking: Reported on 06/29/2014) 120 tablet 0 Not Taking   Scheduled:  . aspirin  324 mg Oral NOW   Or  . aspirin  300 mg Rectal NOW  . carvedilol  3.125 mg Oral BID WC  . docusate sodium  100 mg Oral BID  . [START ON 07/12/2014] FLUoxetine  40 mg Oral QAC breakfast  . furosemide  20 mg Oral Daily  . hydrochlorothiazide  25 mg Oral Daily  . insulin aspart  0-9 Units Subcutaneous TID WC  . [START ON 07/12/2014] levothyroxine  300 mcg Oral QAC breakfast  . methocarbamol  750 mg Oral QID  . methylphenidate  5 mg Oral BID  . metoprolol  5 mg Intravenous Q6H  . OxyCODONE  20 mg Oral Q12H  . pantoprazole (PROTONIX) IV  40 mg Intravenous Daily  . pregabalin  200 mg Oral TID    Assessment: 59yo female was admitted to [facility] on 3/11 for liver ablation for metastasis, today developed significant nausea and severe aching pain in RUQ as well as intermittent substernal discomfort, troponin found to be significantly elevated, tx'd to [name] ICU for further eval, to begin heparin for NSTEMI.  Goal of Therapy:  Heparin level 0.3-0.7 units/ml Monitor platelets by anticoagulation protocol: Yes   Plan:  Will begin heparin gtt at 1250  units/hr (without bolus per MD request) and monitor heparin levels and CBC.  Wynona Neat, PharmD, BCPS  07/11/2014,11:06 PM

## 2014-07-11 NOTE — Progress Notes (Signed)
Patient had 9/10 CP, diaphoretic, EKG shows ST, septal infarct, BP 101/76. 3 MDs notified, Dr. Halina Maidens, and Critical Care MD. No orders put in, all 3 advised to [facility]. [facility] behind 1.5 hours, MD notified. Will call [facility] for faster transport.

## 2014-07-11 NOTE — H&P (Addendum)
PULMONARY / CRITICAL CARE MEDICINE   Name: Kerri Vasquez MRN: [id number] DOB: 13-Nov-1955    ADMISSION DATE:  07/10/2014 CONSULTATION DATE:  07/11/14  REFERRING MD :    CHIEF COMPLAINT:  Nausea and diaphoresis  INITIAL PRESENTATION:   STUDIES:  Echo 07/11/14 pending read  SIGNIFICANT EVENTS: 07/10/14: liver ablation for metastasis 07/11/14: transferred to [facility] for suspected MI   HISTORY OF PRESENT ILLNESS:  This is a 59 year old female has a history of severe morbid obesity s/p her gastric bypass.  Had breast cancer treated s/p bilateral mastectomy and chemotherapy in [place] and had a liver recurrence which was solitary treated with ablation in 2013. She was admitted yesterday in [place] for an ablation. Today she had significant nausea and had severe aching pain in her right upper quadrant but also had substernal chest discomfort intermittent upper chest is persisted since last night. Vomited 4-5 times, severe diaphoresis, and has bilateral pleural effusions and interstitial edema consistent with heart failure. The troponin was elevated at 16. She had a normal echocardiogram in 2011 and apparently today she had a repeat Echo, I did not see it myself but preliminary verbal read is that she has an very depressed global EF (maybe 15-20%) She got Zosyn in [place] in the acute setting, She reports no fever, no chills, no productive cough. Dyspnea is stable. She got IV diuretic and BB, apparently at one point her BP was a little low however her BP was being measured in her calf. Does have a little RUQ pain after the ablation. Has minimal shin edema. No calf tenderness.  PAST MEDICAL HISTORY :   has a past medical history of Depression; Hypertension; Migraine; Hypothyroidism; Incisional hernia; Spinal stenosis; Scoliosis; Sciatica; Intestinal obstruction; GERD (gastroesophageal reflux disease); Septic arthritis (01/28/10); Osteoarthritis of multiple joints; Anemia; Pernicious anemia  (05/30/2011); Sleep apnea; Diabetes mellitus; Renal insufficiency; Fibromyalgia; and Fracture of multiple toes.  has past surgical history that includes Tonsillectomy (1969); Multiple extractions with alveoloplasty (06/08/2011); Gastric bypass (2006); intestinal blockage (2011); Breast surgery (2008); Breast reconstruction (2008, 2009); Cholecystectomy (06/29/85); radio frequency ablation to liver (sept 27, 2013); and Thyroid lobectomy (02/16/2012). Prior to Admission medications   Medication Sig Start Date End Date Taking? Authorizing Provider  acetaminophen (TYLENOL) 500 MG tablet Take 1,000 mg by mouth every 4 (four) hours as needed for moderate pain.   Yes Historical Provider, MD  FLUoxetine (PROZAC) 40 MG capsule Take 40 mg by mouth daily before breakfast.    Yes Historical Provider, MD  fulvestrant (FASLODEX) 250 MG/5ML injection Inject 250 mg into the muscle every 30 (thirty) days. One injection each buttock over 1-2 minutes. Warm prior to use.   Yes Historical Provider, MD  furosemide (LASIX) 20 MG tablet TAKE 1 TABLET BY MOUTH EVERY DAY Patient taking differently: Take one tablet by mouth every mornig 02/04/14  Yes Volanda Napoleon, MD  levothyroxine (SYNTHROID, LEVOTHROID) 300 MCG tablet Take 300 mcg by mouth daily before breakfast.   Yes Historical Provider, MD  losartan-hydrochlorothiazide (HYZAAR) 100-25 MG per tablet Take 1 tablet by mouth every morning.    Yes Historical Provider, MD  methocarbamol (ROBAXIN) 750 MG tablet Take 750 mg by mouth 4 (four) times daily.   Yes Historical Provider, MD  methylphenidate (RITALIN) 5 MG tablet Take 1 tablet (5 mg total) by mouth 2 (two) times daily. Patient taking differently: Take 5 mg by mouth every morning.  05/14/14  Yes Volanda Napoleon, MD  oxyCODONE (OXY IR/ROXICODONE) 5 MG immediate release  tablet Take 5 mg by mouth every 4 (four) hours as needed (For pain.).   Yes Historical Provider, MD  OxyCODONE (OXYCONTIN) 20 mg T12A 12 hr tablet Take 20 mg by  mouth every 12 (twelve) hours.   Yes Historical Provider, MD  oxyCODONE-acetaminophen (PERCOCET) 10-325 MG per tablet Take 1 tablet by mouth every 6 (six) hours as needed for pain. Patient taking differently: Take 1 tablet by mouth every 4 (four) hours as needed for pain.  01/05/14  Yes Virgel Manifold, MD  palbociclib [facility]) 100 MG capsule Take 1 pill a day for 21 days on and 7 days off.  Take with food 05/14/14  Yes Volanda Napoleon, MD  pregabalin (LYRICA) 200 MG capsule Take 200 mg by mouth 3 (three) times daily.   Yes Historical Provider, MD  promethazine (PHENERGAN) 25 MG tablet Take 1 tablet (25 mg total) by mouth every 8 (eight) hours as needed for nausea or vomiting. Patient taking differently: Take 25 mg by mouth every 6 (six) hours as needed for nausea or vomiting.  11/25/13  Yes Volanda Napoleon, MD  temazepam (RESTORIL) 15 MG capsule TAKE 1 CAPSULE BY MOUTH EVERY DAY AT BEDTIME AS NEEDED FOR SLEEP Patient taking differently: TAKE 1 CAPSULE BY MOUTH EVERY DAY AT BEDTIME AS NEEDED FOR SLEEP////07/07/14 per daughter may be repeated x 1 06/30/14  Yes Volanda Napoleon, MD  tiZANidine (ZANAFLEX) 4 MG tablet Take 4 mg by mouth every 6 (six) hours as needed for muscle spasms.   Yes Historical Provider, MD  potassium chloride (K-DUR) 10 MEQ tablet Take 4 tablets (40 mEq total) by mouth daily. Patient not taking: Reported on 06/29/2014 05/27/14   Volanda Napoleon, MD   Allergies  Allergen Reactions  . Influenza Vac Split [Flu Virus Vaccine] Other (See Comments)    Joint stiffness and renal failure  . Zostavax [Zoster Vaccine Live ([employer])] Other (See Comments)    Joint stiffness, renal failure  . Adhesive [Tape] Itching and Rash    FAMILY HISTORY:  indicated that her mother is alive. She indicated that her father is deceased.  SOCIAL HISTORY:  reports that she has never smoked. She has never used smokeless tobacco. She reports that she does not drink alcohol or use illicit drugs.  REVIEW OF  SYSTEMS:  10 points ROS reviewed and are negative other than what were noted in HPI  SUBJECTIVE:   VITAL SIGNS: Temp:  [97.7 F (36.5 C)-98.4 F (36.9 C)] 97.7 F (36.5 C) (03/12 1641) Pulse Rate:  [114-138] 120 (03/12 2015) Resp:  [14-22] 22 (03/12 2015) BP: (72-130)/(50-92) 97/73 mmHg (03/12 2025) SpO2:  [27 %-98 %] 98 % (03/12 2015) FiO2 (%):  [94 %] 94 % (03/12 1116) Weight:  [110.2 kg (242 lb 15.2 oz)] 110.2 kg (242 lb 15.2 oz) (03/12 1641) HEMODYNAMICS:   VENTILATOR SETTINGS: Vent Mode:  [-]  FiO2 (%):  [94 %] 94 % INTAKE / OUTPUT:  Intake/Output Summary (Last 24 hours) at 07/11/14 2137 Last data filed at 07/11/14 1857  Gross per 24 hour  Intake   62.5 ml  Output    750 ml  Net -687.5 ml    PHYSICAL EXAMINATION: General:  Awake, alert, oriented x 3, minimal distress at rest Neuro:  No focal weakness, no facial droop, speech normal HEENT:  Atraumatic, no stridor Cardiovascular:  RRR, no loud murmur Lungs:  Bibasilar inspiratory rales, no wheeze Abdomen:  Aoft, no guarding, has direct tendeness at RUQ and epigastric area Musculoskeletal:  Trace  shin edema, no ankle edema Skin:  No rash, no ecchymosis  LABS:  CBC  Recent Labs Lab 07/07/14 1005 07/10/14 1110 07/11/14 1340  WBC 4.8 4.8 12.4*  HGB 9.4* 11.3* 12.1  HCT 28.9* 33.5* 35.4*  PLT 184 203 244   Coag's  Recent Labs Lab 07/07/14 1005 07/11/14 1842  APTT 36 29  INR 1.11 1.21   BMET  Recent Labs Lab 07/07/14 1005 07/10/14 1110 07/11/14 0440  NA 141 140 140  K 4.3 3.8 4.6  CL 110 109 107  CO2 23 23 19   BUN 36* 21 20  CREATININE 1.22* 1.00 1.14*  GLUCOSE 89 99 219*   Electrolytes  Recent Labs Lab 07/07/14 1005 07/10/14 1110 07/11/14 0440  CALCIUM 6.5* 7.6* 7.8*   Sepsis Markers No results for input(s): LATICACIDVEN, PROCALCITON, O2SATVEN in the last 168 hours. ABG No results for input(s): PHART, PCO2ART, PO2ART in the last 168 hours. Liver Enzymes  Recent Labs Lab  07/07/14 1005 07/11/14 0440  AST 23 143*  ALT 17 63*  ALKPHOS 89 140*  BILITOT 0.5 1.0  ALBUMIN 3.6 3.6   Cardiac Enzymes  Recent Labs Lab 07/11/14 1340 07/11/14 1842  TROPONINI 16.91* 18.76*   Glucose  Recent Labs Lab 07/10/14 1035 07/10/14 1601  GLUCAP 83 164*    Imaging Ct Guide Tissue Ablation  07/10/2014   EXAM: IR CT GUIDED TISSUE ABLATION  CT-GUIDED CORE LIVER LESION BIOPSY  Clinical history:  59 year old female with a history of right breast cancer diagnosed 7 years ago. She is status post bilateral mastectomy. Previous percutaneous microwave ablation of solitary medial left hepatic lobe metastasis 01/26/2012. Most recent PET-CT demonstrated new hypermetabolic focus near the ablation zone. She presents today for percutaneous ablation of her hepatic metastatic disease.  Sedation:  General anesthesia was provided by the anesthesiology service  CONTRAST:  None provided  PROCEDURE: Informed consent was obtained from the patient following explanation of the procedure, risks, benefits and alternatives. The patient understands, agrees and consents for the procedure. All questions were addressed. A time out was performed.  A planning triple phase contrast CT scan was performed to formally assess for additional metastatic disease. The solitary lesion in hepatic segment 4A is again identified. The lesion is minimally changed from the most recent PET-CT. No new metastatic disease is identified. The patient is status post prior cholecystectomy. The visualized spleen, pancreas, kidneys and adrenal glands are unremarkable. The patient is status post surgical changes of gastric bypass surgery. There is a small midline abdominal ventral hernia containing  small bowel. Right breast prosthesis partially visualized. Dependent atelectasis in the visualized lung bases.  Maximal barrier sterile technique utilized including caps, mask, sterile gowns, sterile gloves, large sterile drape, hand hygiene,  and betadine skin prep.  Under CT fluoroscopic guidance, a 17 gauge trocar needle was advanced to the margin of the lesion. Once needle tip position was confirmed, coaxial 18-gauge core biopsy samples were obtained, submitted in formalin to surgical pathology. The guide needle was removed. Using CT fluoroscopic technique, a 20 cm 3.7 cm active tip Covidien microwave antenna was then advanced percutaneously into the lesion. Following axial CT confirmation of appropriate antenna placement, thermal ablation was conducted at 45 watts for 10 minutes. Following the successful ablation protocol, the tract was ablated as the antenna removed. Sterile bandage was applied.  Post ablation CT imaging demonstrated expected changes in the ablations on at the site of the prior lesion. No significant intraperitoneal or perihepatic hemorrhage. No pneumothorax. The patient tolerated  the procedure well. There is no immediate complication. She was taken to the PACU where she was extubated.  IMPRESSION:  1. Technically successful core biopsy of solitary hepatic segment 4A lesion. 2. Technically successful CT guided microwave ablation of recurrent solitary hepatic metastasis.   Electronically Signed   By: Lucrezia Europe M.D.   On: 07/10/2014 16:48   Ct Biopsy  07/10/2014   EXAM: IR CT GUIDED TISSUE ABLATION  CT-GUIDED CORE LIVER LESION BIOPSY  Clinical history:  59 year old female with a history of right breast cancer diagnosed 7 years ago. She is status post bilateral mastectomy. Previous percutaneous microwave ablation of solitary medial left hepatic lobe metastasis 01/26/2012. Most recent PET-CT demonstrated new hypermetabolic focus near the ablation zone. She presents today for percutaneous ablation of her hepatic metastatic disease.  Sedation:  General anesthesia was provided by the anesthesiology service  CONTRAST:  None provided  PROCEDURE: Informed consent was obtained from the patient following explanation of the procedure, risks,  benefits and alternatives. The patient understands, agrees and consents for the procedure. All questions were addressed. A time out was performed.  A planning triple phase contrast CT scan was performed to formally assess for additional metastatic disease. The solitary lesion in hepatic segment 4A is again identified. The lesion is minimally changed from the most recent PET-CT. No new metastatic disease is identified. The patient is status post prior cholecystectomy. The visualized spleen, pancreas, kidneys and adrenal glands are unremarkable. The patient is status post surgical changes of gastric bypass surgery. There is a small midline abdominal ventral hernia containing  small bowel. Right breast prosthesis partially visualized. Dependent atelectasis in the visualized lung bases.  Maximal barrier sterile technique utilized including caps, mask, sterile gowns, sterile gloves, large sterile drape, hand hygiene, and betadine skin prep.  Under CT fluoroscopic guidance, a 17 gauge trocar needle was advanced to the margin of the lesion. Once needle tip position was confirmed, coaxial 18-gauge core biopsy samples were obtained, submitted in formalin to surgical pathology. The guide needle was removed. Using CT fluoroscopic technique, a 20 cm 3.7 cm active tip Covidien microwave antenna was then advanced percutaneously into the lesion. Following axial CT confirmation of appropriate antenna placement, thermal ablation was conducted at 45 watts for 10 minutes. Following the successful ablation protocol, the tract was ablated as the antenna removed. Sterile bandage was applied.  Post ablation CT imaging demonstrated expected changes in the ablations on at the site of the prior lesion. No significant intraperitoneal or perihepatic hemorrhage. No pneumothorax. The patient tolerated the procedure well. There is no immediate complication. She was taken to the PACU where she was extubated.  IMPRESSION:  1. Technically successful  core biopsy of solitary hepatic segment 4A lesion. 2. Technically successful CT guided microwave ablation of recurrent solitary hepatic metastasis.   Electronically Signed   By: Lucrezia Europe M.D.   On: 07/10/2014 16:48     ASSESSMENT / PLAN:  PULMONARY A: bilateral lung edema and effusion, likely from new systolic HF. Unlikely PNA, no clinical clues for it P:   Will insert arterial line and if BP is ok then will keep on diuresing Will stop abx for now. Incentive spirometry while in bed  CARDIOVASCULAR A: likely NSTEMI and new HF P:  Will defer to cardiology for management  RENAL A:  CKD stage 2-3 P:   Avoid nephrotoxic meds Monitor electrolytes  GASTROINTESTINAL A:  Vomiting likely part of NSTEMI symptomatology P:   Zofran PRN PO intake as tolerated  HEMATOLOGIC A:   Known breast CA with liver mets Chronic anemia Leukocytosis from acute stress P:  No acute intervention. S/p ablation on 07/10/14  INFECTIOUS A:  Unlikely PNA P:   BCx2  UC  Sputum Abx: zosyn 07/11/14 >> will stop today  ENDOCRINE A:  hypothyroidism P:   Continue levothyroxine Blood sugar target 140-180. Check HbA1c  NEUROLOGIC A:  No acute issues. Does have history of depression P:   Continue depression meds Avoid meds that can cause delirium RASS goal: 0    FAMILY  - Updates:   - Inter-disciplinary family meet or Palliative Care meeting due by:      TODAY'S SUMMARY: transferred from [facility] for suspected NSTEMI. Has pulmonary edema and new systolic HF. Known to have liver mets that is s/p ablation on 07/10/14   Critical care time: 35 minutes   Pulmonary and [place] Pager: [phone]  07/11/2014, 9:37 PM

## 2014-07-11 NOTE — Progress Notes (Signed)
CRITICAL VALUE ALERT  Critical value received: tropinin  Date of notification:  07-11-2014  Time of notification: 9381  Critical value read back: yes  Nurse who received alert: Charolotte Eke  MD notified (1st page): Dr Sanjuana Letters  Time of first page:  1425, in person MD notified (2nd page):  Time of second page:  Responding MD:  Dr Sanjuana Letters  Time MD responded:  1425

## 2014-07-11 NOTE — Progress Notes (Signed)
Critical MB- 79.6

## 2014-07-11 NOTE — Progress Notes (Signed)
[facility] Brief progressive note  Forestine Macho Mahjouba ZCH:[id number] DOB: August 29, 1955 DOA: 07/10/2014  Patient had liver ablation procedure on 06/30/14 due to metastasized breast caner. She developed chest pain and an elevated trop to 18.76. EKG showed ST elevation in V2 without significant reciprocal change. Cardiologist, Dr. Wynonia Lawman saw patient early and wanted to transfer patient to intensive care unit at [facility] for more intensive cardiac monitoring. Pt was placed on intravenous beta blockers early. We was called by nurse to see pt because patient developed hypotension with persistent chest pain. Bp is 80s/50s. Patient is oriented x 3 when we (Dr. Roel Cluck and I) saw patient at bedside. 324 mg of ASA was given. bp improved to above SBP110 after given 500 ml of NS bolus. No nitroglycerin was given due to hypotension. EMS will transfer patient to [facility] hospital urgently. Dr. Roel Cluck spoke to Cardiologist, Dr. Percival Spanish, who will see pt as soon as patient arrives to [facility] hospital. Dr. Roel Cluck also spoke to [facility], Dr. Melvyn Novas who agreed to take over the care to ICU in [facility] hospital.     Date of Service 07/11/2014   Ivor Costa MD and Dr. Roel Cluck [facility] Pager [phone]  If 7PM-7AM, please contact night-coverage [email or web address] Password [facility] 07/11/2014, 8:49 PM

## 2014-07-11 NOTE — Progress Notes (Signed)
Spoke interventional radiology who felt that with the CT scan done earlier today that she could receive heparin if we felt it was useful.  With her elevation of troponin and congestive heart failure and borderline blood pressure would feel better if she was moved to the intensive care unit at [facility] for more intensive cardiac monitoring.  We'll arrange transfer this evening.  Echo was to be done tonight.  In the meantime will place on intravenous beta blockers and Lasix has already been given.  Kerry Hough. MD [facility]  07/11/2014 5:57 PM

## 2014-07-11 NOTE — Progress Notes (Signed)
  Echocardiogram 2D Echocardiogram has been performed.  Kerri Vasquez 07/11/2014, 7:04 PM

## 2014-07-11 NOTE — Progress Notes (Signed)
Patient has elevated troponin(16). Given chest pain, concern that she has AMI, not sure if elevated troponin related to recent Hepatic intervention/infection. Will transfer patient to SDU, assume primary care(Discussed with Rowe Robert), consult cardiology, add NTG/coreg and follow serial cardiac enzymes/2Decho. Patient at increased risk of bleed post ablation, hence will hold off anticoagulation. I updated patient's daughter [name].

## 2014-07-11 NOTE — Consult Note (Signed)
Cardiology Consult Note  Admit date: 07/10/2014 Name: Kerri Vasquez 59 y.o.  female DOB:  Jul 01, 1955 MRN:  [id number]  Today's date:  07/11/2014  Referring Physician:  [facility]  Reason for Consultation:   Chest pain, abnormal troponin  IMPRESSIONS: 1.  Prolonged ongoing chest pain in a patient with a recent percutaneous intervention on a metastatic lesion in the liver associated with significant elevation of troponin that could be a non-ST elevation myocardial infarction.  She is significantly tachycardic at the present time and has moderate ST depression new from a previous EKG 2.  Metastatic breast cancer with recent ablation of the solitary lesion in the liver 3.  Dyspnea with elevated BNP and clinical suggestion of congestive heart failure  4.  Diet-controlled diabetes 5.  Hypertension 6.  Chronic pain syndrome with low back pain 7. Morbid obesity 8.  Possible aspiration pneumonia  RECOMMENDATION: 1.  Obtain urgent echocardiogram to evaluate for LV function and wall motion abnormality.  Blood pressure is borderline but will try small dose of beta blocker to slow tachycardia 2.  If okay with interventional radiology consider heparinization in light of elevated troponin 3.  Dr.Hochrein will be looking at the patient later this evening.  HISTORY: This 58 year old female has a history of severe morbid obesity and has had her gastric bypass.  She has breast cancer treated with bilateral mastectomy and chemotherapy in [place] and had a liver recurrence which was solitary treated with ablation in 2013.  She was admitted yesterday and underwent an ablation.  When she awoke from the ablation she had significant nausea and had severe aching pain in her right upper quadrant but also had substernal chest discomfort intermittent upper chest is persisted since last night.  She has vomited 4-5 times after the procedure and has bilateral pleural effusions and interstitial edema consistent  with heart failure.  The troponin was elevated at 16.  She was transferred to the intensive care unit where she is significantly tachycardic with a heart rate of 130 now and complains of ongoing chest discomfort that has been present since last evening.  She is somewhat sleepy and it is very difficult to tell historically with the symptoms are due to.  She is complaining of mild shortness of breath but is able to lay flat.  She has no prior history of cardiac disease.  She had a normal echocardiogram in 2011.  She is relatively sedentary on average basis.  Past Medical History  Diagnosis Date  . Depression   . Hypertension   . Migraine   . Hypothyroidism   . Incisional hernia   . Spinal stenosis   . Scoliosis   . Sciatica   . Intestinal obstruction   . GERD (gastroesophageal reflux disease)   . Septic arthritis 01/28/10    and spinal stenosis , moderate scoliosis   . Osteoarthritis of multiple joints   . Anemia   . Pernicious anemia 05/30/2011    Iron transfusion and VB12 on 06/06/11  . Sleep apnea     hx of off machine x 4 years   . Diabetes mellitus     type II, controlled by diet 05/25/11   . Renal insufficiency     hx renal failure 2011  . Fibromyalgia   . Fracture of multiple toes     right toes x 4 toes- excluding small toe      Past Surgical History  Procedure Laterality Date  . Tonsillectomy  1969  . Multiple extractions with  alveoloplasty  06/08/2011    Procedure: MULTIPLE EXTRACION WITH ALVEOLOPLASTY;  Surgeon: Lenn Cal, DDS;  Location: [facility];  Service: Oral Surgery;  Laterality: N/A;  Extraction of tooth #'s 3,4,7 with alveoloplasty and Gross Debridement of Remaining Teeth  . Gastric bypass  2006  . Intestinal blockage  2011    surgery for this   . Breast surgery  2008     bilateral mastectomy  . Breast reconstruction  2008, 2009  . Cholecystectomy  06/29/85  . Radio frequency ablation to liver  sept 27, 2013  . Thyroid lobectomy  02/16/2012    Procedure:  THYROID LOBECTOMY;  Surgeon: Earnstine Regal, MD;  Location: [facility];  Service: General;  Laterality: Right;     Allergies:  is allergic to influenza vac split; zostavax; and adhesive.   Medications: Prior to Admission medications   Medication Sig Start Date End Date Taking? Authorizing Provider  acetaminophen (TYLENOL) 500 MG tablet Take 1,000 mg by mouth every 4 (four) hours as needed for moderate pain.   Yes Historical Provider, MD  FLUoxetine (PROZAC) 40 MG capsule Take 40 mg by mouth daily before breakfast.    Yes Historical Provider, MD  fulvestrant (FASLODEX) 250 MG/5ML injection Inject 250 mg into the muscle every 30 (thirty) days. One injection each buttock over 1-2 minutes. Warm prior to use.   Yes Historical Provider, MD  furosemide (LASIX) 20 MG tablet TAKE 1 TABLET BY MOUTH EVERY DAY Patient taking differently: Take one tablet by mouth every mornig 02/04/14  Yes Volanda Napoleon, MD  levothyroxine (SYNTHROID, LEVOTHROID) 300 MCG tablet Take 300 mcg by mouth daily before breakfast.   Yes Historical Provider, MD  losartan-hydrochlorothiazide (HYZAAR) 100-25 MG per tablet Take 1 tablet by mouth every morning.    Yes Historical Provider, MD  methocarbamol (ROBAXIN) 750 MG tablet Take 750 mg by mouth 4 (four) times daily.   Yes Historical Provider, MD  methylphenidate (RITALIN) 5 MG tablet Take 1 tablet (5 mg total) by mouth 2 (two) times daily. Patient taking differently: Take 5 mg by mouth every morning.  05/14/14  Yes Volanda Napoleon, MD  oxyCODONE (OXY IR/ROXICODONE) 5 MG immediate release tablet Take 5 mg by mouth every 4 (four) hours as needed (For pain.).   Yes Historical Provider, MD  OxyCODONE (OXYCONTIN) 20 mg T12A 12 hr tablet Take 20 mg by mouth every 12 (twelve) hours.   Yes Historical Provider, MD  oxyCODONE-acetaminophen (PERCOCET) 10-325 MG per tablet Take 1 tablet by mouth every 6 (six) hours as needed for pain. Patient taking differently: Take 1 tablet by mouth every 4  (four) hours as needed for pain.  01/05/14  Yes Virgel Manifold, MD  palbociclib [facility]) 100 MG capsule Take 1 pill a day for 21 days on and 7 days off.  Take with food 05/14/14  Yes Volanda Napoleon, MD  pregabalin (LYRICA) 200 MG capsule Take 200 mg by mouth 3 (three) times daily.   Yes Historical Provider, MD  promethazine (PHENERGAN) 25 MG tablet Take 1 tablet (25 mg total) by mouth every 8 (eight) hours as needed for nausea or vomiting. Patient taking differently: Take 25 mg by mouth every 6 (six) hours as needed for nausea or vomiting.  11/25/13  Yes Volanda Napoleon, MD  temazepam (RESTORIL) 15 MG capsule TAKE 1 CAPSULE BY MOUTH EVERY DAY AT BEDTIME AS NEEDED FOR SLEEP Patient taking differently: TAKE 1 CAPSULE BY MOUTH EVERY DAY AT BEDTIME AS NEEDED FOR SLEEP////07/07/14 per  daughter may be repeated x 1 06/30/14  Yes Volanda Napoleon, MD  tiZANidine (ZANAFLEX) 4 MG tablet Take 4 mg by mouth every 6 (six) hours as needed for muscle spasms.   Yes Historical Provider, MD  potassium chloride (K-DUR) 10 MEQ tablet Take 4 tablets (40 mEq total) by mouth daily. Patient not taking: Reported on 06/29/2014 05/27/14   Volanda Napoleon, MD    Family History: Family Status  Relation Status Death Age  . Mother Alive   . Father Deceased    Social History:   reports that she has never smoked. She has never used smokeless tobacco. She reports that she does not drink alcohol or use illicit drugs.   History   Social History Narrative   Caffeine use: 3 cups coffee/tea daily   Regular exercise: no          Review of Systems: She evidently has chronic pain and has severe low back pain and neuropathy on a chronic basis.  Other than as noted above remainder of the review of systems is unremarkable.  Physical Exam: BP 82/62 mmHg  Pulse 132  Temp(Src) 97.7 F (36.5 C) (Oral)  Resp 16  Ht 5\' 6"  (1.676 m)  Wt 110.2 kg (242 lb 15.2 oz)  BMI 39.23 kg/m2  SpO2 95%  LMP 08/30/2006  General appearance: She  is a very large white female lying in the bed and sitting on the bedpan right now who complains of midsternal chest pain but does not appear to be in severe distress. Head: Normocephalic, without obvious abnormality, atraumatic Eyes: conjunctivae/corneas clear. PERRL, EOM's intact. Fundi benign. Neck: no adenopathy, no carotid bruit, no JVD and supple, symmetrical, trachea midline Lungs: Reduced breath sounds bilaterally with some rales Heart: Rapid regular rhythm, normal S1 and S2, no S3 Abdomen: Fair large and soft, mild tenderness in right upper quadrant and epigastric area Pelvic: deferred Extremities: No edema noted Pulses: 2+ and symmetric Skin: Skin color, texture, turgor normal. No rashes or lesions Neurologic: Grossly normal  Labs: CBC  Recent Labs  07/11/14 1340  WBC 12.4*  RBC 3.72*  HGB 12.1  HCT 35.4*  PLT 244  MCV 95.2  MCH 32.5  MCHC 34.2  RDW 13.5   CMP   Recent Labs  07/11/14 0440  NA 140  K 4.6  CL 107  CO2 19  GLUCOSE 219*  BUN 20  CREATININE 1.14*  CALCIUM 7.8*  PROT 7.5  ALBUMIN 3.6  AST 143*  ALT 63*  ALKPHOS 140*  BILITOT 1.0  GFRNONAA 52*  GFRAA 60*   BNP (last 3 results)    Component Value Date/Time   BNP 3001.6* 07/11/2014 1340   Cardiac Panel (last 3 results) Cardiac Panel (last 3 results)  Recent Labs  07/11/14 1340  TROPONINI 16.91*     Radiology: Basilar infiltrates compatible with no edema or pneumonia, bilateral effusions  EKG: Marked sinus tachycardia, ST depression in 2 and 3, subtle ST elevation in 1 and aVL with some Q waves with Q waves were present on a prior EKG  Signed:  W. Doristine Church MD [facility]   Cardiology Consultant  07/11/2014, 5:29 PM

## 2014-07-11 NOTE — Progress Notes (Signed)
CRITICAL VALUE ALERT  Critical value received:  MB- 79.6  Date of notification:  07/11/2014   Time of notification:  1914  Critical value read back:Yes.    Nurse who received alert:  Bobbye Charleston  MD notified (1st page):  Wynonia Lawman  Time of first page: 1717  MD notified (2nd page): Hochrein   Time of second page: 1830   Responding MD:  Hochrein  Time MD responded:  1900

## 2014-07-11 NOTE — Consult Note (Signed)
[facility] Medical Consultation  Kerri Vasquez ZDG:[id number] DOB: [birth year] DOA: 07/10/2014 PCP: No PCP Per Kerri Vasquez   Requesting physician: Stacie Glaze, Benjamine Mola Date of consultation: 07/11/14 Reason for consultation: Chest pain/shortness of breath  Assessment The hospitalist service was asked to see Kerri Vasquez for shortness of breath and chest pain S/p CT guided thermal ablation/biopsy of liver lesion (met breast ca) done on 07/10/14 by interventional radiology. Kerri Vasquez is a pleasant 59 y.o. female with multiple medical problems including diabetes mellitus type 2/essential hypertension/hypothyroidism/GERD and a history of right breast carcinoma approximately 8years ago, s/p bilateral mastectomy and now with an apparent metastatic solitary liver lesion. She appears to have Aspiration Pneumonitis because she vomited 4-5 times after the procedure yesterday and CT abdomen and pelvis today shows " 1. No large acute hematoma identified to explain the Kerri Vasquez's symptoms. There is a small amount of perihepatic fluid which appears low in attenuation. No complications at the ablation site. 2. Bilateral pleural effusions and interstitial edema consistent with CHF. 3. Bilateral lower lobe airspace opacities which may represent areas of alveolar edema secondary to CHF. Pneumonia not excluded. 4. Ventral abdominal wall hernia which contained loops of small and large bowel. Do small bowel loops within the hernia arm mildly increased in caliber measuring up to 2.6 cm". She also has leukocytosis of 12,400, which was not apparently yesterday. It would be surprising if Kerri Vasquez has acute exacerbation of CHF. Kerri Vasquez's daughter mentions that Kerri Vasquez had left flank pain about 4-5 days ago, which was felt to be of no significance but this raises possibility that she probably had infection brewing prior to the procedure. Will therefore obtain septic workup including UA/urine culture/blood  culture, check serial cardiac enzymes, TSH, BNP, EKG and 2-D echocardiogram. Would consider diagnostic thoracentesis if pleural effusion persisted. Kerri Vasquez is also diabetic but sugars seem to be generally controlled. Will obtain hemoglobin A1c and add sliding scale insulin for now. Will also give bronchodilators as needed and request incentive spirometry. Meanwhile, will give Zosyn to cover anaerobes. 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 We will follow along with you. Please contact me if I can be of assistance in the meanwhile.   Chief Complaint: Chest pain/abdominal pain/dyspnea  HPI:  Kerri Vasquez developed pleuritic chest pain associated with abdominal pain and shortness of breath earlier today but this was preceded by 4-5 episodes of vomiting yesterday after her procedure. She is mostly drowsy and her daughter gives most of the history. Daughter states that Kerri Vasquez was mostly in good health prior to the procedure except for some left flank pain which happened for a day or so but resolved on its on, therefore not given much attention. Kerri Vasquez says she has chills and cough productive of scant yellow sputum. She denies unusual diarrhea or dysuria. Vomiting has resolved.  Review of Systems:  As in the hospital course summary above.  Past Medical History  Diagnosis Date  . Depression   . Hypertension   . Migraine   . Hypothyroidism   .  Incisional hernia   . Spinal stenosis   . Scoliosis   . Sciatica   .  Intestinal obstruction   . GERD (gastroesophageal reflux disease)   . Septic arthritis 01/28/10    and spinal stenosis , moderate scoliosis   . Osteoarthritis of multiple joints   . Anemia   . Pernicious anemia 05/30/2011    Iron transfusion and VB12 on 06/06/11  . Trouble swallowing   . Leg swelling     right   . Abdominal pain   . Constipation   . Nausea & vomiting   . Sleep apnea     hx of off machine x 4 years   . Diabetes mellitus     type II, controlled by diet 05/25/11   . Renal insufficiency     hx renal failure 2011  . Fibromyalgia   . Fracture of multiple toes     right toes x 4 toes- excluding small toe  . Cancer 06/01/06    stage II breast cancer, cancer in liver now   Past Surgical History  Procedure Laterality Date  . Tonsillectomy  1969  . Cystectomy  2009    left breast  . Bariatric surgery  2006  . Other surgical history      surgery for intestinal blockage 2011   . Multiple extractions with alveoloplasty  06/08/2011    Procedure: MULTIPLE EXTRACION WITH ALVEOLOPLASTY;  Surgeon: Lenn Cal, DDS;  Location: [facility];  Service: Oral Surgery;  Laterality: N/A;  Extraction of tooth #'s 3,4,7 with alveoloplasty and Gross Debridement of Remaining Teeth  . Gastric bypass  2006  . Intestinal blockage  2011    surgery for this   . Breast surgery  2008     bilateral mastectomy  . Breast reconstruction  2008, 2009  . Cholecystectomy  06/29/85  . Radio frequency ablation to liver  sept 27, 2013  . Thyroid lobectomy  02/16/2012    Procedure: THYROID LOBECTOMY;  Surgeon: Earnstine Regal, MD;  Location: [facility];  Service: General;  Laterality: Right;   Social History:  reports that she has never smoked. She has never used smokeless tobacco. She reports that she does not drink alcohol or use illicit drugs.  Allergies  Allergen Reactions  . Influenza Vac Split [Flu Virus Vaccine] Other (See Comments)     Joint stiffness and renal failure  . Zostavax [Zoster Vaccine Live ([employer])] Other (See Comments)    Joint stiffness, renal failure  . Adhesive [Tape] Itching and Rash   Family History  Problem Relation Age of Onset  . Cancer Mother     breast  . Hypertension Mother   . Anesthesia problems Daughter     Prior to Admission medications   Medication Sig Start Date End Date Taking? Authorizing Provider  acetaminophen (TYLENOL) 500 MG tablet Take 1,000 mg by mouth every 4 (four) hours as needed for moderate pain.   Yes Historical Provider, MD  FLUoxetine (PROZAC) 40 MG capsule Take 40 mg by mouth daily before breakfast.    Yes Historical Provider, MD  fulvestrant (FASLODEX) 250 MG/5ML injection Inject 250 mg into the muscle every 30 (thirty) days. One injection each buttock over 1-2 minutes. Warm prior to use.   Yes Historical Provider, MD  furosemide (LASIX) 20 MG tablet TAKE 1 TABLET BY MOUTH EVERY DAY Kerri Vasquez taking differently: Take one tablet by mouth every mornig 02/04/14  Yes Volanda Napoleon, MD  levothyroxine (SYNTHROID, LEVOTHROID) 300 MCG tablet Take 300 mcg by mouth daily before breakfast.   Yes Historical Provider, MD  losartan-hydrochlorothiazide (HYZAAR) 100-25 MG per tablet Take  1 tablet by mouth every morning.    Yes Historical Provider, MD  methocarbamol (ROBAXIN) 750 MG tablet Take 750 mg by mouth 4 (four) times daily.   Yes Historical Provider, MD  methylphenidate (RITALIN) 5 MG tablet Take 1 tablet (5 mg total) by mouth 2 (two) times daily. Kerri Vasquez taking differently: Take 5 mg by mouth every morning.  05/14/14  Yes Volanda Napoleon, MD  oxyCODONE (OXY IR/ROXICODONE) 5 MG immediate release tablet Take 5 mg by mouth every 4 (four) hours as needed (For pain.).   Yes Historical Provider, MD  OxyCODONE (OXYCONTIN) 20 mg T12A 12 hr tablet Take 20 mg by mouth every 12 (twelve) hours.   Yes Historical Provider, MD  oxyCODONE-acetaminophen (PERCOCET) 10-325 MG per tablet Take 1  tablet by mouth every 6 (six) hours as needed for pain. Kerri Vasquez taking differently: Take 1 tablet by mouth every 4 (four) hours as needed for pain.  01/05/14  Yes Virgel Manifold, MD  palbociclib [facility]) 100 MG capsule Take 1 pill a day for 21 days on and 7 days off.  Take with food 05/14/14  Yes Volanda Napoleon, MD  pregabalin (LYRICA) 200 MG capsule Take 200 mg by mouth 3 (three) times daily.   Yes Historical Provider, MD  promethazine (PHENERGAN) 25 MG tablet Take 1 tablet (25 mg total) by mouth every 8 (eight) hours as needed for nausea or vomiting. Kerri Vasquez taking differently: Take 25 mg by mouth every 6 (six) hours as needed for nausea or vomiting.  11/25/13  Yes Volanda Napoleon, MD  temazepam (RESTORIL) 15 MG capsule TAKE 1 CAPSULE BY MOUTH EVERY DAY AT BEDTIME AS NEEDED FOR SLEEP Kerri Vasquez taking differently: TAKE 1 CAPSULE BY MOUTH EVERY DAY AT BEDTIME AS NEEDED FOR SLEEP////07/07/14 per daughter may be repeated x 1 06/30/14  Yes Volanda Napoleon, MD  tiZANidine (ZANAFLEX) 4 MG tablet Take 4 mg by mouth every 6 (six) hours as needed for muscle spasms.   Yes Historical Provider, MD  potassium chloride (K-DUR) 10 MEQ tablet Take 4 tablets (40 mEq total) by mouth daily. Kerri Vasquez not taking: Reported on 06/29/2014 05/27/14   Volanda Napoleon, MD   Physical Exam: Blood pressure 130/92, pulse 128, temperature 98.2 F (36.8 C), temperature source Oral, resp. rate 18, height '5\' 6"'  (1.676 m), weight 112.946 kg (249 lb), last menstrual period 08/30/2006, SpO2 27 %. Filed Vitals:   07/11/14 1116  BP:   Pulse:   Temp:   Resp: 18     General:  Comfortable at rest  Eyes: Normal  ENT: Normal  Neck: No JVD  Cardiovascular: S1-S2 normal. No murmurs. RRR.  Respiratory: Good air entry bilaterally. Bilateral rhonchi no rails.  Abdomen: Soft but tender to palpation on the right side. Normal bowel sounds. No organomegaly.  Skin: Intact  Musculoskeletal: No pedal edema.  Psychiatric:  Normal  Neurologic: Grossly intact.  Labs on Admission:  Basic Metabolic Panel:  Recent Labs Lab 07/07/14 1005 07/10/14 1110 07/11/14 0440  NA 141 140 140  K 4.3 3.8 4.6  CL 110 109 107  CO2 '23 23 19  ' GLUCOSE 89 99 219*  BUN 36* 21 20  CREATININE 1.22* 1.00 1.14*  CALCIUM 6.5* 7.6* 7.8*   Liver Function Tests:  Recent Labs Lab 07/07/14 1005 07/11/14 0440  AST 23 143*  ALT 17 63*  ALKPHOS 89 140*  BILITOT 0.5 1.0  PROT 7.1 7.5  ALBUMIN 3.6 3.6   No results for input(s): LIPASE, AMYLASE in the last  168 hours. No results for input(s): AMMONIA in the last 168 hours. CBC:  Recent Labs Lab 07/07/14 1005 07/10/14 1110 07/11/14 1340  WBC 4.8 4.8 12.4*  NEUTROABS 2.6  --   --   HGB 9.4* 11.3* 12.1  HCT 28.9* 33.5* 35.4*  MCV 97.6 95.7 95.2  PLT 184 203 244   Cardiac Enzymes: No results for input(s): CKTOTAL, CKMB, CKMBINDEX, TROPONINI in the last 168 hours. BNP: Invalid input(s): POCBNP CBG:  Recent Labs Lab 07/10/14 1035 07/10/14 1601  GLUCAP 83 164*    Radiological Exams on Admission: Ct Abdomen Wo Contrast  07/11/2014   CLINICAL DATA:  Epigastric pain. Status post microwave ablation 1 day ago.  EXAM: CT ABDOMEN WITHOUT CONTRAST  TECHNIQUE: Multidetector CT imaging of the abdomen was performed following the standard protocol without IV contrast.  COMPARISON:  None.  FINDINGS: Lower chest: There are bilateral moderate pleural effusions. Interstitial thickening is identified within the lungs bilaterally consistent with edema. There are airspace opacities within both lower lobes.  Hepatobiliary: Along the falciform ligament there is a 2.3 cm focal area of low attenuation within the medial segment of left hepatic lobe compatible with ablation defect. Small adjacent focus of hyperattenuation is identified on image number 19/series 2 which measures 1.4 cm. Previous cholecystectomy. The common bile duct is increased in caliber measuring up to 1.2 cm.  Pancreas:  Negative  Spleen: Negative  Adrenals/Urinary Tract: The adrenal glands are normal. Again noted are bilateral lobulated kidneys. No obstructive uropathy or other acute finding.  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  Vascular/Lymphatic: Normal appearance of the abdominal aorta. No enlarged retroperitoneal or mesenteric adenopathy.  Other: Ventral abdominal wall hernia is identified which contains small and large bowel loops. The small bowel loops are mildly increased in caliber measuring up to 2.6 cm. There is perihepatic ascites which is new from previous exam.  Musculoskeletal: There are no aggressive lytic or sclerotic bone lesions. Solid fusion of the L2-3 vertebra noted. There is marked degenerative disc disease at the L3-4 level.  IMPRESSION: 1. No large acute hematoma identified to explain the Kerri Vasquez's symptoms. There is a small amount of perihepatic fluid which appears low in attenuation. No complications at the ablation site. 2. Bilateral pleural effusions and interstitial edema consistent with CHF. 3. Bilateral lower lobe airspace opacities which may represent areas of alveolar edema secondary to CHF. Pneumonia not excluded. 4. Ventral abdominal wall hernia which contained loops of small and large bowel. Do small bowel loops within the hernia arm mildly increased in caliber measuring up to 2.6 cm.   Electronically Signed   By: Kerby Moors M.D.   On: 07/11/2014 11:09   Ct Guide Tissue Ablation  07/10/2014   EXAM: IR CT GUIDED TISSUE ABLATION  CT-GUIDED CORE LIVER LESION BIOPSY  Clinical history:  59 year old female with a history of right breast cancer diagnosed 7 years ago. She is status post bilateral mastectomy. Previous percutaneous microwave ablation of solitary medial left hepatic lobe metastasis 01/26/2012. Most recent PET-CT demonstrated new hypermetabolic focus near the ablation zone. She presents today for  percutaneous ablation of her hepatic metastatic disease.  Sedation:  General anesthesia was provided by the anesthesiology service  CONTRAST:  None provided  PROCEDURE: Informed consent was obtained from the Kerri Vasquez following explanation of the procedure, risks, benefits and alternatives. The Kerri Vasquez understands, agrees and consents for the procedure. All questions were addressed. A time out was performed.  A planning  triple phase contrast CT scan was performed to formally assess for additional metastatic disease. The solitary lesion in hepatic segment 4A is again identified. The lesion is minimally changed from the most recent PET-CT. No new metastatic disease is identified. The Kerri Vasquez is status post prior cholecystectomy. The visualized spleen, pancreas, kidneys and adrenal glands are unremarkable. The Kerri Vasquez is status post surgical changes of gastric bypass surgery. There is a small midline abdominal ventral hernia containing  small bowel. Right breast prosthesis partially visualized. Dependent atelectasis in the visualized lung bases.  Maximal barrier sterile technique utilized including caps, mask, sterile gowns, sterile gloves, large sterile drape, hand hygiene, and betadine skin prep.  Under CT fluoroscopic guidance, a 17 gauge trocar needle was advanced to the margin of the lesion. Once needle tip position was confirmed, coaxial 18-gauge core biopsy samples were obtained, submitted in formalin to surgical pathology. The guide needle was removed. Using CT fluoroscopic technique, a 20 cm 3.7 cm active tip Covidien microwave antenna was then advanced percutaneously into the lesion. Following axial CT confirmation of appropriate antenna placement, thermal ablation was conducted at 45 watts for 10 minutes. Following the successful ablation protocol, the tract was ablated as the antenna removed. Sterile bandage was applied.  Post ablation CT imaging demonstrated expected changes in the ablations on at the site  of the prior lesion. No significant intraperitoneal or perihepatic hemorrhage. No pneumothorax. The Kerri Vasquez tolerated the procedure well. There is no immediate complication. She was taken to the PACU where she was extubated.  IMPRESSION:  1. Technically successful core biopsy of solitary hepatic segment 4A lesion. 2. Technically successful CT guided microwave ablation of recurrent solitary hepatic metastasis.   Electronically Signed   By: Lucrezia Europe M.D.   On: 07/10/2014 16:48   Ct Biopsy  07/10/2014   EXAM: IR CT GUIDED TISSUE ABLATION  CT-GUIDED CORE LIVER LESION BIOPSY  Clinical history:  59 year old female with a history of right breast cancer diagnosed 7 years ago. She is status post bilateral mastectomy. Previous percutaneous microwave ablation of solitary medial left hepatic lobe metastasis 01/26/2012. Most recent PET-CT demonstrated new hypermetabolic focus near the ablation zone. She presents today for percutaneous ablation of her hepatic metastatic disease.  Sedation:  General anesthesia was provided by the anesthesiology service  CONTRAST:  None provided  PROCEDURE: Informed consent was obtained from the Kerri Vasquez following explanation of the procedure, risks, benefits and alternatives. The Kerri Vasquez understands, agrees and consents for the procedure. All questions were addressed. A time out was performed.  A planning triple phase contrast CT scan was performed to formally assess for additional metastatic disease. The solitary lesion in hepatic segment 4A is again identified. The lesion is minimally changed from the most recent PET-CT. No new metastatic disease is identified. The Kerri Vasquez is status post prior cholecystectomy. The visualized spleen, pancreas, kidneys and adrenal glands are unremarkable. The Kerri Vasquez is status post surgical changes of gastric bypass surgery. There is a small midline abdominal ventral hernia containing  small bowel. Right breast prosthesis partially visualized. Dependent  atelectasis in the visualized lung bases.  Maximal barrier sterile technique utilized including caps, mask, sterile gowns, sterile gloves, large sterile drape, hand hygiene, and betadine skin prep.  Under CT fluoroscopic guidance, a 17 gauge trocar needle was advanced to the margin of the lesion. Once needle tip position was confirmed, coaxial 18-gauge core biopsy samples were obtained, submitted in formalin to surgical pathology. The guide needle was removed. Using CT fluoroscopic technique, a 20 cm 3.7  cm active tip Covidien microwave antenna was then advanced percutaneously into the lesion. Following axial CT confirmation of appropriate antenna placement, thermal ablation was conducted at 45 watts for 10 minutes. Following the successful ablation protocol, the tract was ablated as the antenna removed. Sterile bandage was applied.  Post ablation CT imaging demonstrated expected changes in the ablations on at the site of the prior lesion. No significant intraperitoneal or perihepatic hemorrhage. No pneumothorax. The Kerri Vasquez tolerated the procedure well. There is no immediate complication. She was taken to the PACU where she was extubated.  IMPRESSION:  1. Technically successful core biopsy of solitary hepatic segment 4A lesion. 2. Technically successful CT guided microwave ablation of recurrent solitary hepatic metastasis.   Electronically Signed   By: Lucrezia Europe M.D.   On: 07/10/2014 16:48    EKG: Independently reviewed.   Time spent: 55 minutes  Jabari Swoveland [facility] Pager [phone]  If 7PM-7AM, please contact night-coverage [email or web address] Password [facility] 07/11/2014, 2:09 PM

## 2014-07-12 LAB — GLUCOSE, CAPILLARY
GLUCOSE-CAPILLARY: 144 mg/dL — AB (ref 70–99)
GLUCOSE-CAPILLARY: 98 mg/dL (ref 70–99)
Glucose-Capillary: 103 mg/dL — ABNORMAL HIGH (ref 70–99)
Glucose-Capillary: 119 mg/dL — ABNORMAL HIGH (ref 70–99)
Glucose-Capillary: 154 mg/dL — ABNORMAL HIGH (ref 70–99)
Glucose-Capillary: 109 mg/dL — ABNORMAL HIGH (ref 70–99)

## 2014-07-12 LAB — CBC WITH DIFFERENTIAL/PLATELET
Basophils Absolute: 0 10*3/uL (ref 0.0–0.1)
Basophils Relative: 0 % (ref 0–1)
EOS ABS: 0 10*3/uL (ref 0.0–0.7)
EOS PCT: 0 % (ref 0–5)
HEMATOCRIT: 36.4 % (ref 36.0–46.0)
Hemoglobin: 12.4 g/dL (ref 12.0–15.0)
Lymphocytes Relative: 5 % — ABNORMAL LOW (ref 12–46)
Lymphs Abs: 0.6 10*3/uL — ABNORMAL LOW (ref 0.7–4.0)
MCH: 32.4 pg (ref 26.0–34.0)
MCHC: 34.1 g/dL (ref 30.0–36.0)
MCV: 95 fL (ref 78.0–100.0)
MONOS PCT: 6 % (ref 3–12)
Monocytes Absolute: 0.8 10*3/uL (ref 0.1–1.0)
NEUTROS PCT: 89 % — AB (ref 43–77)
Neutro Abs: 11.1 10*3/uL — ABNORMAL HIGH (ref 1.7–7.7)
Platelets: 237 10*3/uL (ref 150–400)
RBC: 3.83 MIL/uL — ABNORMAL LOW (ref 3.87–5.11)
RDW: 13.9 % (ref 11.5–15.5)
WBC: 12.4 10*3/uL — ABNORMAL HIGH (ref 4.0–10.5)

## 2014-07-12 LAB — PROTIME-INR
INR: 1.2 (ref 0.00–1.49)
PROTHROMBIN TIME: 15.3 s — AB (ref 11.6–15.2)

## 2014-07-12 LAB — CK TOTAL AND CKMB (NOT AT ARMC)
CK, MB: 24.4 ng/mL (ref 0.3–4.0)
RELATIVE INDEX: 3.6 — AB (ref 0.0–2.5)
Total CK: 687 U/L — ABNORMAL HIGH (ref 7–177)

## 2014-07-12 LAB — COMPREHENSIVE METABOLIC PANEL
ALBUMIN: 3 g/dL — AB (ref 3.5–5.2)
ALT: 51 U/L — AB (ref 0–35)
ANION GAP: 11 (ref 5–15)
AST: 100 U/L — AB (ref 0–37)
Alkaline Phosphatase: 108 U/L (ref 39–117)
BUN: 24 mg/dL — AB (ref 6–23)
CO2: 23 mmol/L (ref 19–32)
Calcium: 7.7 mg/dL — ABNORMAL LOW (ref 8.4–10.5)
Chloride: 104 mmol/L (ref 96–112)
Creatinine, Ser: 1.3 mg/dL — ABNORMAL HIGH (ref 0.50–1.10)
GFR calc Af Amer: 51 mL/min — ABNORMAL LOW (ref >90)
GFR, EST NON AFRICAN AMERICAN: 44 mL/min — AB (ref >90)
Glucose, Bld: 132 mg/dL — ABNORMAL HIGH (ref 70–99)
Potassium: 4 mmol/L (ref 3.5–5.1)
SODIUM: 138 mmol/L (ref 135–145)
Total Bilirubin: 1.9 mg/dL — ABNORMAL HIGH (ref 0.3–1.2)
Total Protein: 6.8 g/dL (ref 6.0–8.3)

## 2014-07-12 LAB — HEPARIN LEVEL (UNFRACTIONATED)
Heparin Unfractionated: 0.12 IU/mL — ABNORMAL LOW (ref 0.30–0.70)
Heparin Unfractionated: 0.19 IU/mL — ABNORMAL LOW (ref 0.30–0.70)

## 2014-07-12 LAB — TROPONIN I: Troponin I: 9.43 ng/mL (ref <0.031)

## 2014-07-12 LAB — MRSA PCR SCREENING: MRSA BY PCR: NEGATIVE

## 2014-07-12 MED ORDER — CETYLPYRIDINIUM CHLORIDE 0.05 % MT LIQD
7.0000 mL | Freq: Two times a day (BID) | OROMUCOSAL | Status: DC
  Administered 2014-07-12 – 2014-07-18 (×13): 7 mL via OROMUCOSAL

## 2014-07-12 MED ORDER — SODIUM CHLORIDE 0.9 % IJ SOLN
3.0000 mL | Freq: Two times a day (BID) | INTRAMUSCULAR | Status: DC
  Administered 2014-07-13 – 2014-07-14 (×3): 3 mL via INTRAVENOUS

## 2014-07-12 MED ORDER — NALOXONE HCL 0.4 MG/ML IJ SOLN: 0.4000 mg | INTRAMUSCULAR | Status: DC | PRN

## 2014-07-12 MED ORDER — ASPIRIN 81 MG PO CHEW
81.0000 mg | CHEWABLE_TABLET | ORAL | Status: AC
  Administered 2014-07-13: 81 mg via ORAL
  Filled 2014-07-12: qty 1

## 2014-07-12 MED ORDER — NALOXONE HCL 0.4 MG/ML IJ SOLN
INTRAMUSCULAR | Status: AC
  Administered 2014-07-12: 0.2 mg via INTRAVENOUS
  Filled 2014-07-12: qty 1

## 2014-07-12 MED ORDER — NALOXONE HCL 0.4 MG/ML IJ SOLN
INTRAMUSCULAR | Status: AC
  Filled 2014-07-12: qty 1

## 2014-07-12 MED ORDER — HEPARIN (PORCINE) IN NACL 100-0.45 UNIT/ML-% IJ SOLN
1450.0000 [IU]/h | INTRAMUSCULAR | Status: DC
  Administered 2014-07-12 (×2): 1450 [IU]/h via INTRAVENOUS
  Filled 2014-07-12 (×2): qty 250

## 2014-07-12 MED ORDER — FUROSEMIDE 10 MG/ML IJ SOLN
40.0000 mg | Freq: Every day | INTRAMUSCULAR | Status: DC
  Administered 2014-07-12: 40 mg via INTRAVENOUS
  Filled 2014-07-12: qty 4

## 2014-07-12 MED ORDER — SODIUM CHLORIDE 0.9 % IV BOLUS (SEPSIS)
500.0000 mL | Freq: Once | INTRAVENOUS | Status: AC
  Administered 2014-07-12: 500 mL via INTRAVENOUS

## 2014-07-12 MED ORDER — HEPARIN (PORCINE) IN NACL 100-0.45 UNIT/ML-% IJ SOLN
1650.0000 [IU]/h | INTRAMUSCULAR | Status: DC
  Filled 2014-07-12 (×2): qty 250

## 2014-07-12 MED ORDER — PHENYLEPHRINE HCL 10 MG/ML IJ SOLN
30.0000 ug/min | INTRAMUSCULAR | Status: DC
  Administered 2014-07-13: 50 ug/min via INTRAVENOUS
  Administered 2014-07-13: 150 ug/min via INTRAVENOUS
  Filled 2014-07-12 (×3): qty 1

## 2014-07-12 MED ORDER — NALOXONE HCL 0.4 MG/ML IJ SOLN
0.2000 mg | Freq: Once | INTRAMUSCULAR | Status: AC
  Administered 2014-07-12: 0.2 mg via INTRAVENOUS

## 2014-07-12 MED ORDER — SODIUM CHLORIDE 0.9 % IV SOLN: INTRAVENOUS | Status: DC

## 2014-07-12 MED ORDER — SODIUM CHLORIDE 0.9 % IJ SOLN: 3.0000 mL | INTRAMUSCULAR | Status: DC | PRN

## 2014-07-12 MED ORDER — NALOXONE HCL 0.4 MG/ML IJ SOLN
0.4000 mg | Freq: Once | INTRAMUSCULAR | Status: AC
  Administered 2014-07-12: 0.4 mg via INTRAVENOUS

## 2014-07-12 NOTE — Progress Notes (Signed)
eLink Physician-Brief Progress Note Patient Name: Sammie Schermerhorn DOB: 10-Sep-1955 MRN: [id number]   Date of Service  07/12/2014  HPI/Events of Note  Poorly responsive, hypotensive Elevated LFTs   eICU Interventions  Good response to narcan Dc oxycontin/ vicodin Fluid bolus 500     Intervention Category Major Interventions: Change in mental status - evaluation and management  Wilbert Hayashi V. 07/12/2014, 11:35 PM

## 2014-07-12 NOTE — Progress Notes (Signed)
UR Completed.  [phone]  

## 2014-07-12 NOTE — Progress Notes (Signed)
Patient apparently has only 1 peripheral IV. She needs another IV for cath, we do not know what time the cath is going to be yet. I spoke to the patient about it and she is not very happy about having to have a central line for this reason, she would prefer a PICC line instead, which I think is more than reasonable, especially if we do not even know when the cath is going to be. Will not do a central line for now and will order PICC line

## 2014-07-12 NOTE — Progress Notes (Signed)
Patient ID: Kerri Vasquez, female   DOB: 12/30/1955, 59 y.o.   MRN: [id number]    Referring Physician(s): Dr. Marin Olp  Subjective:  Pt still c/o intermittent substernal chest discomfort as well as abdominal pain; on bedpan now due to need for BM  Allergies: Influenza vac split; Zostavax; and Adhesive  Medications: Prior to Admission medications   Medication Sig Start Date End Date Taking? Authorizing Provider  acetaminophen (TYLENOL) 500 MG tablet Take 1,000 mg by mouth every 4 (four) hours as needed for moderate pain.   Yes Historical Provider, MD  FLUoxetine (PROZAC) 40 MG capsule Take 40 mg by mouth daily before breakfast.    Yes Historical Provider, MD  fulvestrant (FASLODEX) 250 MG/5ML injection Inject 250 mg into the muscle every 30 (thirty) days. One injection each buttock over 1-2 minutes. Warm prior to use.   Yes Historical Provider, MD  furosemide (LASIX) 20 MG tablet TAKE 1 TABLET BY MOUTH EVERY DAY Patient taking differently: Take one tablet by mouth every mornig 02/04/14  Yes Volanda Napoleon, MD  levothyroxine (SYNTHROID, LEVOTHROID) 300 MCG tablet Take 300 mcg by mouth daily before breakfast.   Yes Historical Provider, MD  losartan-hydrochlorothiazide (HYZAAR) 100-25 MG per tablet Take 1 tablet by mouth every morning.    Yes Historical Provider, MD  methocarbamol (ROBAXIN) 750 MG tablet Take 750 mg by mouth 4 (four) times daily.   Yes Historical Provider, MD  methylphenidate (RITALIN) 5 MG tablet Take 1 tablet (5 mg total) by mouth 2 (two) times daily. Patient taking differently: Take 5 mg by mouth every morning.  05/14/14  Yes Volanda Napoleon, MD  oxyCODONE (OXY IR/ROXICODONE) 5 MG immediate release tablet Take 5 mg by mouth every 4 (four) hours as needed (For pain.).   Yes Historical Provider, MD  OxyCODONE (OXYCONTIN) 20 mg T12A 12 hr tablet Take 20 mg by mouth every 12 (twelve) hours.   Yes Historical Provider, MD  oxyCODONE-acetaminophen (PERCOCET) 10-325 MG per tablet  Take 1 tablet by mouth every 6 (six) hours as needed for pain. Patient taking differently: Take 1 tablet by mouth every 4 (four) hours as needed for pain.  01/05/14  Yes Virgel Manifold, MD  palbociclib [facility]) 100 MG capsule Take 1 pill a day for 21 days on and 7 days off.  Take with food 05/14/14  Yes Volanda Napoleon, MD  pregabalin (LYRICA) 200 MG capsule Take 200 mg by mouth 3 (three) times daily.   Yes Historical Provider, MD  promethazine (PHENERGAN) 25 MG tablet Take 1 tablet (25 mg total) by mouth every 8 (eight) hours as needed for nausea or vomiting. Patient taking differently: Take 25 mg by mouth every 6 (six) hours as needed for nausea or vomiting.  11/25/13  Yes Volanda Napoleon, MD  temazepam (RESTORIL) 15 MG capsule TAKE 1 CAPSULE BY MOUTH EVERY DAY AT BEDTIME AS NEEDED FOR SLEEP Patient taking differently: TAKE 1 CAPSULE BY MOUTH EVERY DAY AT BEDTIME AS NEEDED FOR SLEEP////07/07/14 per daughter may be repeated x 1 06/30/14  Yes Volanda Napoleon, MD  tiZANidine (ZANAFLEX) 4 MG tablet Take 4 mg by mouth every 6 (six) hours as needed for muscle spasms.   Yes Historical Provider, MD  potassium chloride (K-DUR) 10 MEQ tablet Take 4 tablets (40 mEq total) by mouth daily. Patient not taking: Reported on 06/29/2014 05/27/14   Volanda Napoleon, MD     Vital Signs: BP 101/69 mmHg  Pulse 110  Temp(Src) 97.8 F (36.6 C) (Oral)  Resp 21  Ht '5\' 6"'  (1.676 m)  Wt 240 lb 11.9 oz (109.2 kg)  BMI 38.88 kg/m2  SpO2 98%  LMP 08/30/2006  Physical Exam pt awake but still weak appearing; chest- scatt rales, more so on left; heart- reg but still tachy; abd- soft,+BS, gen tenderness, puncture site RUQ clean and dry, mildly tender (pt with known large HH); trace pretib edema LE's  Imaging: Ct Abdomen Wo Contrast  07/11/2014   CLINICAL DATA:  Epigastric pain. Status post microwave ablation 1 day ago.  EXAM: CT ABDOMEN WITHOUT CONTRAST  TECHNIQUE: Multidetector CT imaging of the abdomen was performed following  the standard protocol without IV contrast.  COMPARISON:  None.  FINDINGS: Lower chest: There are bilateral moderate pleural effusions. Interstitial thickening is identified within the lungs bilaterally consistent with edema. There are airspace opacities within both lower lobes.  Hepatobiliary: Along the falciform ligament there is a 2.3 cm focal area of low attenuation within the medial segment of left hepatic lobe compatible with ablation defect. Small adjacent focus of hyperattenuation is identified on image number 19/series 2 which measures 1.4 cm. Previous cholecystectomy. The common bile duct is increased in caliber measuring up to 1.2 cm.  Pancreas: Negative  Spleen: Negative  Adrenals/Urinary Tract: The adrenal glands are normal. Again noted are bilateral lobulated kidneys. No obstructive uropathy or other acute finding.  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  Vascular/Lymphatic: Normal appearance of the abdominal aorta. No enlarged retroperitoneal or mesenteric adenopathy.  Other: Ventral abdominal wall hernia is identified which contains small and large bowel loops. The small bowel loops are mildly increased in caliber measuring up to 2.6 cm. There is perihepatic ascites which is new from previous exam.  Musculoskeletal: There are no aggressive lytic or sclerotic bone lesions. Solid fusion of the L2-3 vertebra noted. There is marked degenerative disc disease at the L3-4 level.  IMPRESSION: 1. No large acute hematoma identified to explain the patient's symptoms. There is a small amount of perihepatic fluid which appears low in attenuation. No complications at the ablation site. 2. Bilateral pleural effusions and interstitial edema consistent with CHF. 3. Bilateral lower lobe airspace opacities which may represent areas of alveolar edema secondary to CHF. Pneumonia not excluded. 4. Ventral abdominal wall hernia which  contained loops of small and large bowel. Do small bowel loops within the hernia arm mildly increased in caliber measuring up to 2.6 cm.   Electronically Signed   By: Kerby Moors M.D.   On: 07/11/2014 11:09   Ct Guide Tissue Ablation  07/10/2014   EXAM: IR CT GUIDED TISSUE ABLATION  CT-GUIDED CORE LIVER LESION BIOPSY  Clinical history:  59 year old female with a history of right breast cancer diagnosed 7 years ago. She is status post bilateral mastectomy. Previous percutaneous microwave ablation of solitary medial left hepatic lobe metastasis 01/26/2012. Most recent PET-CT demonstrated new hypermetabolic focus near the ablation zone. She presents today for percutaneous ablation of her hepatic metastatic disease.  Sedation:  General anesthesia was provided by the anesthesiology service  CONTRAST:  None provided  PROCEDURE: Informed consent was obtained from the patient following explanation of the procedure, risks, benefits and alternatives. The patient understands, agrees and consents for the procedure. All questions were addressed. A time out was performed.  A planning triple phase contrast CT scan was performed to formally assess for additional metastatic disease. The solitary lesion in hepatic segment 4A is again identified. The lesion is minimally  changed from the most recent PET-CT. No new metastatic disease is identified. The patient is status post prior cholecystectomy. The visualized spleen, pancreas, kidneys and adrenal glands are unremarkable. The patient is status post surgical changes of gastric bypass surgery. There is a small midline abdominal ventral hernia containing  small bowel. Right breast prosthesis partially visualized. Dependent atelectasis in the visualized lung bases.  Maximal barrier sterile technique utilized including caps, mask, sterile gowns, sterile gloves, large sterile drape, hand hygiene, and betadine skin prep.  Under CT fluoroscopic guidance, a 17 gauge trocar needle was  advanced to the margin of the lesion. Once needle tip position was confirmed, coaxial 18-gauge core biopsy samples were obtained, submitted in formalin to surgical pathology. The guide needle was removed. Using CT fluoroscopic technique, a 20 cm 3.7 cm active tip Covidien microwave antenna was then advanced percutaneously into the lesion. Following axial CT confirmation of appropriate antenna placement, thermal ablation was conducted at 45 watts for 10 minutes. Following the successful ablation protocol, the tract was ablated as the antenna removed. Sterile bandage was applied.  Post ablation CT imaging demonstrated expected changes in the ablations on at the site of the prior lesion. No significant intraperitoneal or perihepatic hemorrhage. No pneumothorax. The patient tolerated the procedure well. There is no immediate complication. She was taken to the PACU where she was extubated.  IMPRESSION:  1. Technically successful core biopsy of solitary hepatic segment 4A lesion. 2. Technically successful CT guided microwave ablation of recurrent solitary hepatic metastasis.   Electronically Signed   By: Lucrezia Europe M.D.   On: 07/10/2014 16:48   Ct Biopsy  07/10/2014   EXAM: IR CT GUIDED TISSUE ABLATION  CT-GUIDED CORE LIVER LESION BIOPSY  Clinical history:  59 year old female with a history of right breast cancer diagnosed 7 years ago. She is status post bilateral mastectomy. Previous percutaneous microwave ablation of solitary medial left hepatic lobe metastasis 01/26/2012. Most recent PET-CT demonstrated new hypermetabolic focus near the ablation zone. She presents today for percutaneous ablation of her hepatic metastatic disease.  Sedation:  General anesthesia was provided by the anesthesiology service  CONTRAST:  None provided  PROCEDURE: Informed consent was obtained from the patient following explanation of the procedure, risks, benefits and alternatives. The patient understands, agrees and consents for the  procedure. All questions were addressed. A time out was performed.  A planning triple phase contrast CT scan was performed to formally assess for additional metastatic disease. The solitary lesion in hepatic segment 4A is again identified. The lesion is minimally changed from the most recent PET-CT. No new metastatic disease is identified. The patient is status post prior cholecystectomy. The visualized spleen, pancreas, kidneys and adrenal glands are unremarkable. The patient is status post surgical changes of gastric bypass surgery. There is a small midline abdominal ventral hernia containing  small bowel. Right breast prosthesis partially visualized. Dependent atelectasis in the visualized lung bases.  Maximal barrier sterile technique utilized including caps, mask, sterile gowns, sterile gloves, large sterile drape, hand hygiene, and betadine skin prep.  Under CT fluoroscopic guidance, a 17 gauge trocar needle was advanced to the margin of the lesion. Once needle tip position was confirmed, coaxial 18-gauge core biopsy samples were obtained, submitted in formalin to surgical pathology. The guide needle was removed. Using CT fluoroscopic technique, a 20 cm 3.7 cm active tip Covidien microwave antenna was then advanced percutaneously into the lesion. Following axial CT confirmation of appropriate antenna placement, thermal ablation was conducted at 45 watts  for 10 minutes. Following the successful ablation protocol, the tract was ablated as the antenna removed. Sterile bandage was applied.  Post ablation CT imaging demonstrated expected changes in the ablations on at the site of the prior lesion. No significant intraperitoneal or perihepatic hemorrhage. No pneumothorax. The patient tolerated the procedure well. There is no immediate complication. She was taken to the PACU where she was extubated.  IMPRESSION:  1. Technically successful core biopsy of solitary hepatic segment 4A lesion. 2. Technically successful CT  guided microwave ablation of recurrent solitary hepatic metastasis.   Electronically Signed   By: Lucrezia Europe M.D.   On: 07/10/2014 16:48   Dg Chest Port 1 View  07/11/2014   CLINICAL DATA:  59 year old female with chest pain, shortness of breath and dyspnea. History breast cancer.  EXAM: PORTABLE CHEST - 1 VIEW  COMPARISON:  Chest x-ray 02/12/2012.  FINDINGS: Mild diffuse interstitial prominence. Patchy ill-defined airspace disease throughout the left lung. Mild diffuse peribronchial cuffing. No pleural effusions. Findings do not appear to reflect pulmonary edema. No definite suspicious appearing pulmonary nodules or masses. Heart size is borderline enlarged, likely accentuated by the patient's low lung volumes. Upper mediastinal contours are within normal limits.  IMPRESSION: 1. The appearance the chest suggests bronchitis with multilobar bronchopneumonia in the left upper and lower lobes. Repeat standing PA and lateral chest radiographs are recommended within 2-3 weeks following trial of antimicrobial therapy to ensure resolution of these findings.   Electronically Signed   By: Vinnie Langton M.D.   On: 07/11/2014 14:30    Labs:  CBC:  Recent Labs  07/07/14 1005 07/10/14 1110 07/11/14 1340 07/12/14 0705  WBC 4.8 4.8 12.4* 12.4*  HGB 9.4* 11.3* 12.1 12.4  HCT 28.9* 33.5* 35.4* 36.4  PLT 184 203 244 237    COAGS:  Recent Labs  07/07/14 1005 07/11/14 1842 07/12/14 0705  INR 1.11 1.21 1.20  APTT 36 29  --     BMP:  Recent Labs  07/07/14 1005 07/10/14 1110 07/11/14 0440 07/12/14 0705  NA 141 140 140 138  K 4.3 3.8 4.6 4.0  CL 110 109 107 104  CO2 '23 23 19 23  ' GLUCOSE 89 99 219* 132*  BUN 36* 21 20 24*  CALCIUM 6.5* 7.6* 7.8* 7.7*  CREATININE 1.22* 1.00 1.14* 1.30*  GFRNONAA 48* 60* 52* 44*  GFRAA 55* 70* 60* 51*    LIVER FUNCTION TESTS:  Recent Labs  05/14/14 1225 06/29/14 0824 07/07/14 1005 07/11/14 0440 07/12/14 0705  BILITOT 0.3 0.70 0.5 1.0 1.9*  AST  '13 27 23 ' 143* 100*  ALT '10 31 17 ' 63* 51*  ALKPHOS 88 101* 89 140* 108  PROT 6.4 7.2 7.1 7.5 6.8  ALBUMIN 3.4*  --  3.6 3.6 3.0*    Assessment and Plan: S/p CT guided thermal ablation/biopsy of liver lesion 3/11 (met breast ca); now with NSTEMI on IV heparin; reduced EF/CHF; poss asp PNA; latest troponin 9.43(18.76); WBC 12.4(12.4);hgb stable; LFT's elevated which can be seen post ablation; creat 1.3(1.14); blood/urine cx's pend; check liver path;  IR attending updated; greatly appreciate excellent care from TR[facility]/CCM/cards   Signed: ALLRED,D KEVIN 07/12/2014, 9:53 AM   I spent a total of 15 minutes in face to face in clinical consultation/evaluation, greater than 50% of which was counseling/coordinating care for thermal ablation of liver lesion

## 2014-07-12 NOTE — Progress Notes (Signed)
No distress No new complaints  Filed Vitals:   07/12/14 1500 07/12/14 1513 07/12/14 1600 07/12/14 1700  BP: 95/57  103/64 111/68  Pulse: 80  103   Temp:  98.9 F (37.2 C)    TempSrc:  Oral    Resp: 23  21 15  Height:      Weight:      SpO2: 95%  99%    HEENT WNL No JVD noted Chest clear anteriorly RRR s M NABS, soft No LE edema  I have reviewed all of today's lab results. Relevant abnormalities are discussed in the A/P section  No new CXR  IMPRESSION: Hx of breast Ca S/P ablation of solitary liver met 3/11 NSTEMI  PLAN/REC: Cards wishes to keep her in ICU until 3/14 Cont rx per Cards There is little role for PCCM here - I have talked with TRH who will resume primary service duties as of AM 3/14 and PCCM will sign off. She may be transferred to SDU from PCCM perspective The rest of her med mgmt has been reviewed   , MD ; PCCM service Mobile [phone].  After 5:30 PM or weekends, call [phone]  

## 2014-07-12 NOTE — Progress Notes (Signed)
Notified MD that radial arterial line could not be placed by RT. Will hold off on a-line for now.  Vella Raring, RN

## 2014-07-12 NOTE — Progress Notes (Signed)
CRITICAL VALUE ALERT  Critical value received:  CK-MB 24.4 and Troponin 9.43  Date of notification:  07/12/14  Time of notification:  0909  Critical value read back: Yes  Nurse who received alert:  Maia Petties, RN   MD notified (1st page):  Dr. Alva Garnet, CCM  Time of first page:  0914  MD notified (2nd page): N/A  Time of second page: N/A  Responding MD: Dr. Alva Garnet, CCM  Time MD responded:  [phone]

## 2014-07-12 NOTE — Progress Notes (Addendum)
Subjective:  She was transferred to [facility] last night and is in the ICU now.  Continues to complain of right upper quadrant pain and also has pain at the site of her previous hernia that has a bowel loop in it.  Vague lower sternal and epigastric pain.  She was not taken to the cath lab because of the late presentation.  Her troponins are on a downward trend now.  No EKG done today but EKG yesterday showed a hint of ST elevation in lead V2 after she got here, ST depression was present in leads 2 and aVF.  No shortness of breath at the present time.  Blood pressure is borderline.  Unable to put arterial line in left wrist.  Her echocardiogram shows an EF of about 20% there is a question of some severe hypokinesis of the inferior and anterior walls, and apex does come in and some.  Objective:  Vital Signs in the last 24 hours: BP 105/69 mmHg  Pulse 113  Temp(Src) 97.8 F (36.6 C) (Oral)  Resp 20  Ht 5\' 6"  (1.676 m)  Wt 109.2 kg (240 lb 11.9 oz)  BMI 38.88 kg/m2  SpO2 97%  LMP 08/30/2006  Physical Exam: Obese female in no acute distress Lungs:  Clear Cardiac: Rapid Regular rhythm, normal S1 and S2, no S3 no murmur Abdomen:  Soft, nontender, no masses Extremities:  No edema present  Intake/Output from previous day: 03/12 0701 - 03/13 0700 In: 140 [P.O.:50; I.V.:77.5; IV Piggyback:12.5] Out: 900 [Urine:900]  Weight Filed Weights   07/10/14 1122 07/11/14 1641 07/12/14 0449  Weight: 112.946 kg (249 lb) 110.2 kg (242 lb 15.2 oz) 109.2 kg (240 lb 11.9 oz)    Lab Results: Basic Metabolic Panel:  Recent Labs  07/11/14 0440 07/12/14 0705  NA 140 138  K 4.6 4.0  CL 107 104  CO2 19 23  GLUCOSE 219* 132*  BUN 20 24*  CREATININE 1.14* 1.30*   CBC:  Recent Labs  07/11/14 1340 07/12/14 0705  WBC 12.4* 12.4*  NEUTROABS  --  11.1*  HGB 12.1 12.4  HCT 35.4* 36.4  MCV 95.2 95.0  PLT 244 237   Cardiac Enzymes: Cardiac Panel (last 3 results)  Recent Labs  07/11/14 1340 07/11/14 1842 07/12/14 0705  CKTOTAL 926*  --  687*  CKMB 79.6*  --  24.4*  TROPONINI 16.91* 18.76* 9.43*  RELINDX 8.6*  --  3.6*    Telemetry: Sinus tachycardia  Assessment/Plan:  1.  Non-ST elevation myocardial infarction following ablation 2.  Cardiomyopathy with ejection fraction of 20-25%-unclear whether this is ischemic or is stress related .  I looked up her recent chemotherapy Leslee Home that is a kind ACE inhibitor.  Evidently no reports of cardiomyopathy following it.  3.  Morbid obesity 4.  Recent ablation of liver lesion for metastatic breast cancer-solitary metastasis  Recommendations:  Continue intravenous heparin for the time being.  Troponins are made downward trend.  Add beta blocker.  Creatinine is up some so will not use ACE inhibitor as blood pressure is borderline.  I think it would be best to do catheterization on her tomorrow.  Hopefully can go radial.  IV access is starting to be somewhat of a problem.  Critical care following. Cardiac catheterization procedure was discussed with the patient fully including risks of myocardial infarction, death, stroke, bleeding, arrhythmia, dye allergy, or renal insufficiency. The patient understands and is willing to proceed.   Kerry Hough  MD [facility] Cardiology  07/12/2014, 11:50 AM

## 2014-07-12 NOTE — Progress Notes (Signed)
ANTICOAGULATION CONSULT NOTE - Initial Consult  Pharmacy Consult for heparin Indication: NSTEMI  Allergies  Allergen Reactions  . Influenza Vac Split [Flu Virus Vaccine] Other (See Comments)    Joint stiffness and renal failure  . Zostavax [Zoster Vaccine Live ([employer])] Other (See Comments)    Joint stiffness, renal failure  . Adhesive [Tape] Itching and Rash    Patient Measurements: Height: 5\' 6"  (167.6 cm) Weight: 240 lb 11.9 oz (109.2 kg) IBW/kg (Calculated) : 59.3 Heparin Dosing Weight: 90kg  Vital Signs: Temp: 97.8 F (36.6 C) (03/13 0758) Temp Source: Oral (03/13 0758) BP: 110/72 mmHg (03/13 1000) Pulse Rate: 117 (03/13 1000)  Labs:  Recent Labs  07/10/14 1110 07/11/14 0440 07/11/14 1340 07/11/14 1842 07/12/14 0705  HGB 11.3*  --  12.1  --  12.4  HCT 33.5*  --  35.4*  --  36.4  PLT 203  --  244  --  237  APTT  --   --   --  29  --   LABPROT  --   --   --  15.4* 15.3*  INR  --   --   --  1.21 1.20  HEPARINUNFRC  --   --   --   --  0.12*  CREATININE 1.00 1.14*  --   --  1.30*  CKTOTAL  --   --  926*  --  687*  CKMB  --   --  79.6*  --  24.4*  TROPONINI  --   --  16.91* 18.76* 9.43*    Estimated Creatinine Clearance: 58.3 mL/min (by C-G formula based on Cr of 1.3).   Medical History: Past Medical History  Diagnosis Date  . Depression   . Hypertension   . Migraine   . Hypothyroidism   . Incisional hernia   . Spinal stenosis   . Scoliosis   . Sciatica   . Intestinal obstruction   . GERD (gastroesophageal reflux disease)   . Septic arthritis 01/28/10    and spinal stenosis , moderate scoliosis   . Osteoarthritis of multiple joints   . Anemia   . Pernicious anemia 05/30/2011    Iron transfusion and VB12 on 06/06/11  . Sleep apnea     hx of off machine x 4 years   . Diabetes mellitus     type II, controlled by diet 05/25/11   . Renal insufficiency     hx renal failure 2011  . Fibromyalgia   . Fracture of multiple toes     right toes x 4 toes-  excluding small toe    Medications:  . heparin 1,250 Units/hr     Assessment: 59yo female was admitted to [facility] on 3/11 for liver ablation for metastasis, today developed significant nausea and severe aching pain in RUQ as well as intermittent substernal discomfort, troponin found to be significantly elevated, tx'd to [facility] ICU for further eval, to begin heparin for NSTEMI.  Goal of Therapy:  Heparin level 0.3-0.7 units/ml Monitor platelets by anticoagulation protocol: Yes   Plan:  -Increase IV heparin to 1450 units/hr. -Recheck heparin level in 6 hrs -Daily heparin level and CBC -F/u plans for further cardiac workup  Uvaldo Rising, BCPS  Clinical Pharmacist Pager [phone]  07/12/2014 10:44 AM

## 2014-07-12 NOTE — Progress Notes (Signed)
RT attempted to place a-line in x2 with no success. Patient refuses to let RT try again. RN aware

## 2014-07-12 NOTE — Progress Notes (Signed)
Pt  Minimally responsive, Dr. Elsworth Soho called into room on elink camera, Narcan given, will monitor closely

## 2014-07-12 NOTE — Progress Notes (Signed)
ANTICOAGULATION CONSULT NOTE - [name] for heparin Indication: NSTEMI  Allergies  Allergen Reactions  . Influenza Vac Split [Flu Virus Vaccine] Other (See Comments)    Joint stiffness and renal failure  . Zostavax [Zoster Vaccine Live ([employer])] Other (See Comments)    Joint stiffness, renal failure  . Adhesive [Tape] Itching and Rash    Patient Measurements: Height: 5\' 6"  (167.6 cm) Weight: 240 lb 11.9 oz (109.2 kg) IBW/kg (Calculated) : 59.3 Heparin Dosing Weight: 90kg  Vital Signs: Temp: 98.9 F (37.2 C) (03/13 1513) Temp Source: Oral (03/13 1513) BP: 103/65 mmHg (03/13 1900) Pulse Rate: 99 (03/13 1900)  Labs:  Recent Labs  07/10/14 1110 07/11/14 0440 07/11/14 1340 07/11/14 1842 07/12/14 0705 07/12/14 1811  HGB 11.3*  --  12.1  --  12.4  --   HCT 33.5*  --  35.4*  --  36.4  --   PLT 203  --  244  --  237  --   APTT  --   --   --  29  --   --   LABPROT  --   --   --  15.4* 15.3*  --   INR  --   --   --  1.21 1.20  --   HEPARINUNFRC  --   --   --   --  0.12* 0.19*  CREATININE 1.00 1.14*  --   --  1.30*  --   CKTOTAL  --   --  926*  --  687*  --   CKMB  --   --  79.6*  --  24.4*  --   TROPONINI  --   --  16.91* 18.76* 9.43*  --     Estimated Creatinine Clearance: 58.3 mL/min (by C-G formula based on Cr of 1.3).   Medical History: Past Medical History  Diagnosis Date  . Depression   . Hypertension   . Migraine   . Hypothyroidism   . Incisional hernia   . Spinal stenosis   . Scoliosis   . Sciatica   . Intestinal obstruction   . GERD (gastroesophageal reflux disease)   . Septic arthritis 01/28/10    and spinal stenosis , moderate scoliosis   . Osteoarthritis of multiple joints   . Anemia   . Pernicious anemia 05/30/2011    Iron transfusion and VB12 on 06/06/11  . Sleep apnea     hx of off machine x 4 years   . Diabetes mellitus     type II, controlled by diet 05/25/11   . Renal insufficiency     hx renal failure 2011   . Fibromyalgia   . Fracture of multiple toes     right toes x 4 toes- excluding small toe    Medications:  . heparin 1,250 Units/hr     Assessment: 59yo female was admitted to [facility] on 3/11 for liver ablation for metastasis, today developed significant nausea and severe aching pain in RUQ as well as intermittent substernal discomfort, troponin found to be significantly elevated, tx'd to [facility] ICU for further eval, to begin heparin for NSTEMI.  Heparin level remains low despite earlier rate increase to 1450 units/hr.  Spoke with RN, no trouble with IV/infusion.  Goal of Therapy:  Heparin level 0.3-0.7 units/ml Monitor platelets by anticoagulation protocol: Yes   Plan:  -Increase IV heparin to 1650 units/hr. -Recheck heparin level in 6 hrs -Daily heparin level and CBC -F/u plans for further cardiac workup  Kerri Billow  Rosanna Vasquez, BCPS  Clinical Pharmacist Pager [phone]  07/12/2014 7:41 PM

## 2014-07-12 NOTE — Progress Notes (Signed)
Limited vein access. IV team tried to start peripheral IV 3 times without success. Notified MD; order for central line placement before Cath lab tomorrow. Will continue to monitor.  Maia Petties, RN  07/12/14

## 2014-07-12 NOTE — Progress Notes (Signed)
[facility] Progress Note Patient Name: Kerri Vasquez DOB: 18-Feb-1956 MRN: [id number]   Date of Service  07/12/2014  HPI/Events of Note    eICU Interventions  Rpt narcan Dc coreg, metoprolol, lasix     Intervention Category Major Interventions: Hypotension - evaluation and management  ALVA,RAKESH V. 07/12/2014, 11:54 PM

## 2014-07-13 ENCOUNTER — Inpatient Hospital Stay (HOSPITAL_COMMUNITY): Payer: Medicare Other

## 2014-07-13 DIAGNOSIS — I214 Non-ST elevation (NSTEMI) myocardial infarction: Secondary | ICD-10-CM

## 2014-07-13 DIAGNOSIS — R579 Shock, unspecified: Secondary | ICD-10-CM

## 2014-07-13 DIAGNOSIS — G9340 Encephalopathy, unspecified: Secondary | ICD-10-CM

## 2014-07-13 DIAGNOSIS — I10 Essential (primary) hypertension: Secondary | ICD-10-CM

## 2014-07-13 DIAGNOSIS — R0602 Shortness of breath: Secondary | ICD-10-CM

## 2014-07-13 DIAGNOSIS — C50911 Malignant neoplasm of unspecified site of right female breast: Secondary | ICD-10-CM

## 2014-07-13 LAB — CBC
HCT: 33.5 % — ABNORMAL LOW (ref 36.0–46.0)
HEMOGLOBIN: 11.2 g/dL — AB (ref 12.0–15.0)
MCH: 32.3 pg (ref 26.0–34.0)
MCHC: 33.4 g/dL (ref 30.0–36.0)
MCV: 96.5 fL (ref 78.0–100.0)
PLATELETS: 197 10*3/uL (ref 150–400)
RBC: 3.47 MIL/uL — ABNORMAL LOW (ref 3.87–5.11)
RDW: 14.1 % (ref 11.5–15.5)
WBC: 14.1 10*3/uL — ABNORMAL HIGH (ref 4.0–10.5)

## 2014-07-13 LAB — GLUCOSE, CAPILLARY
GLUCOSE-CAPILLARY: 169 mg/dL — AB (ref 70–99)
GLUCOSE-CAPILLARY: 111 mg/dL — AB (ref 70–99)
GLUCOSE-CAPILLARY: 101 mg/dL — AB (ref 70–99)
Glucose-Capillary: 115 mg/dL — ABNORMAL HIGH (ref 70–99)
Glucose-Capillary: 117 mg/dL — ABNORMAL HIGH (ref 70–99)

## 2014-07-13 LAB — COMPREHENSIVE METABOLIC PANEL
ALT: 39 U/L — ABNORMAL HIGH (ref 0–35)
AST: 70 U/L — ABNORMAL HIGH (ref 0–37)
Albumin: 2.6 g/dL — ABNORMAL LOW (ref 3.5–5.2)
Alkaline Phosphatase: 96 U/L (ref 39–117)
Anion gap: 5 (ref 5–15)
BILIRUBIN TOTAL: 2.5 mg/dL — AB (ref 0.3–1.2)
BUN: 33 mg/dL — ABNORMAL HIGH (ref 6–23)
CALCIUM: 7.3 mg/dL — AB (ref 8.4–10.5)
CHLORIDE: 100 mmol/L (ref 96–112)
CO2: 29 mmol/L (ref 19–32)
CREATININE: 1.56 mg/dL — AB (ref 0.50–1.10)
GFR calc non Af Amer: 35 mL/min — ABNORMAL LOW (ref >90)
GFR, EST AFRICAN AMERICAN: 41 mL/min — AB (ref >90)
GLUCOSE: 145 mg/dL — AB (ref 70–99)
Potassium: 4.1 mmol/L (ref 3.5–5.1)
Sodium: 134 mmol/L — ABNORMAL LOW (ref 135–145)
Total Protein: 5.7 g/dL — ABNORMAL LOW (ref 6.0–8.3)

## 2014-07-13 LAB — CARBOXYHEMOGLOBIN
Carboxyhemoglobin: 1 % (ref 0.5–1.5)
METHEMOGLOBIN: 1.8 % — AB (ref 0.0–1.5)
O2 Saturation: 73.6 %
Total hemoglobin: 12.2 g/dL (ref 12.0–16.0)

## 2014-07-13 LAB — HEPARIN LEVEL (UNFRACTIONATED)
Heparin Unfractionated: <0.1 IU/mL — ABNORMAL LOW (ref 0.30–0.70)
Heparin Unfractionated: 0.23 IU/mL — ABNORMAL LOW (ref 0.30–0.70)
Heparin Unfractionated: 0.23 IU/mL — ABNORMAL LOW (ref 0.30–0.70)

## 2014-07-13 LAB — HEMOGLOBIN A1C
Hgb A1c MFr Bld: 5.2 % (ref 4.8–5.6)
Mean Plasma Glucose: 103 mg/dL

## 2014-07-13 LAB — URINE CULTURE
Colony Count: NO GROWTH
Culture: NO GROWTH

## 2014-07-13 MED ORDER — SODIUM CHLORIDE 0.9 % IJ SOLN
10.0000 mL | INTRAMUSCULAR | Status: DC | PRN
  Administered 2014-07-15: 20 mL
  Administered 2014-07-15: 30 mL
  Administered 2014-07-16: 20 mL
  Administered 2014-07-16 (×2): 10 mL
  Filled 2014-07-13 (×5): qty 40

## 2014-07-13 MED ORDER — OXYCODONE HCL ER 10 MG PO T12A
10.0000 mg | EXTENDED_RELEASE_TABLET | Freq: Two times a day (BID) | ORAL | Status: DC
  Administered 2014-07-13 – 2014-07-15 (×5): 10 mg via ORAL
  Filled 2014-07-13 (×5): qty 1

## 2014-07-13 MED ORDER — HEPARIN (PORCINE) IN NACL 100-0.45 UNIT/ML-% IJ SOLN
2000.0000 [IU]/h | INTRAMUSCULAR | Status: DC
  Administered 2014-07-13: 1650 [IU]/h via INTRAVENOUS
  Filled 2014-07-13 (×4): qty 250

## 2014-07-13 MED ORDER — SODIUM CHLORIDE 0.9 % IJ SOLN
10.0000 mL | Freq: Two times a day (BID) | INTRAMUSCULAR | Status: DC
  Administered 2014-07-13: 10 mL
  Administered 2014-07-13: 20 mL
  Administered 2014-07-13: 30 mL
  Administered 2014-07-14: 10 mL
  Administered 2014-07-15 – 2014-07-16 (×2): 30 mL

## 2014-07-13 MED ORDER — NALOXONE HCL 1 MG/ML IJ SOLN
0.2500 mg/h | INTRAMUSCULAR | Status: DC
  Administered 2014-07-13: 0.4 mg/h via INTRAVENOUS
  Filled 2014-07-13: qty 4

## 2014-07-13 MED ORDER — GLUCERNA SHAKE PO LIQD
237.0000 mL | Freq: Two times a day (BID) | ORAL | Status: DC
  Administered 2014-07-13 – 2014-07-18 (×7): 237 mL via ORAL

## 2014-07-13 MED ORDER — PANTOPRAZOLE SODIUM 40 MG PO TBEC
40.0000 mg | DELAYED_RELEASE_TABLET | Freq: Every day | ORAL | Status: DC
  Administered 2014-07-13 – 2014-07-18 (×6): 40 mg via ORAL
  Filled 2014-07-13 (×6): qty 1

## 2014-07-13 MED ORDER — TIZANIDINE HCL 2 MG PO TABS
1.0000 mg | ORAL_TABLET | Freq: Every evening | ORAL | Status: DC | PRN
Start: 2014-07-13 — End: 2014-07-18
  Filled 2014-07-13: qty 0.5

## 2014-07-13 MED ORDER — ASPIRIN EC 81 MG PO TBEC
81.0000 mg | DELAYED_RELEASE_TABLET | Freq: Every day | ORAL | Status: DC
  Filled 2014-07-13: qty 1

## 2014-07-13 MED ORDER — PREGABALIN 100 MG PO CAPS
200.0000 mg | ORAL_CAPSULE | Freq: Two times a day (BID) | ORAL | Status: DC
  Administered 2014-07-13 – 2014-07-17 (×8): 200 mg via ORAL
  Filled 2014-07-13 (×8): qty 2

## 2014-07-13 MED ORDER — NOREPINEPHRINE BITARTRATE 1 MG/ML IV SOLN
2.0000 ug/min | INTRAVENOUS | Status: DC
  Administered 2014-07-13: 5 ug/min via INTRAVENOUS
  Administered 2014-07-14: 8 ug/min via INTRAVENOUS
  Filled 2014-07-13 (×2): qty 16

## 2014-07-13 MED ORDER — LIDOCAINE HCL (PF) 1 % IJ SOLN
INTRAMUSCULAR | Status: AC
  Administered 2014-07-13: 1.5 mL
  Filled 2014-07-13: qty 5

## 2014-07-13 MED ORDER — OXYCODONE HCL 5 MG PO TABS
5.0000 mg | ORAL_TABLET | ORAL | Status: DC | PRN
  Administered 2014-07-13 – 2014-07-18 (×11): 5 mg via ORAL
  Filled 2014-07-13 (×12): qty 1

## 2014-07-13 NOTE — Progress Notes (Signed)
Referring Physician(s): Ennever  Subjective:  Hx breast ca- met to liver Liver lesion microwave ablation performed as OP in IR 07/10/14 Procedure went well (had liver lesion ablation 12/2011 in IR also) Post procedure developed chest pain and sob NSTEMI and transferred to [facility] Now on CCM service Followed also with cardiology For cardiac acth when renal fxn improved Still weak but chest pain better abd pain is low Rt abdomen   Allergies: Influenza vac split; Zostavax; and Adhesive  Medications: Prior to Admission medications   Medication Sig Start Date End Date Taking? Authorizing Provider  acetaminophen (TYLENOL) 500 MG tablet Take 1,000 mg by mouth every 4 (four) hours as needed for moderate pain.   Yes Historical Provider, MD  FLUoxetine (PROZAC) 40 MG capsule Take 40 mg by mouth daily before breakfast.    Yes Historical Provider, MD  fulvestrant (FASLODEX) 250 MG/5ML injection Inject 250 mg into the muscle every 30 (thirty) days. One injection each buttock over 1-2 minutes. Warm prior to use.   Yes Historical Provider, MD  furosemide (LASIX) 20 MG tablet TAKE 1 TABLET BY MOUTH EVERY DAY Patient taking differently: Take one tablet by mouth every mornig 02/04/14  Yes Volanda Napoleon, MD  levothyroxine (SYNTHROID, LEVOTHROID) 300 MCG tablet Take 300 mcg by mouth daily before breakfast.   Yes Historical Provider, MD  losartan-hydrochlorothiazide (HYZAAR) 100-25 MG per tablet Take 1 tablet by mouth every morning.    Yes Historical Provider, MD  methocarbamol (ROBAXIN) 750 MG tablet Take 750 mg by mouth 4 (four) times daily.   Yes Historical Provider, MD  methylphenidate (RITALIN) 5 MG tablet Take 1 tablet (5 mg total) by mouth 2 (two) times daily. Patient taking differently: Take 5 mg by mouth every morning.  05/14/14  Yes Volanda Napoleon, MD  oxyCODONE (OXY IR/ROXICODONE) 5 MG immediate release tablet Take 5 mg by mouth every 4 (four) hours as needed (For pain.).   Yes Historical  Provider, MD  OxyCODONE (OXYCONTIN) 20 mg T12A 12 hr tablet Take 20 mg by mouth every 12 (twelve) hours.   Yes Historical Provider, MD  oxyCODONE-acetaminophen (PERCOCET) 10-325 MG per tablet Take 1 tablet by mouth every 6 (six) hours as needed for pain. Patient taking differently: Take 1 tablet by mouth every 4 (four) hours as needed for pain.  01/05/14  Yes Virgel Manifold, MD  palbociclib [facility]) 100 MG capsule Take 1 pill a day for 21 days on and 7 days off.  Take with food 05/14/14  Yes Volanda Napoleon, MD  pregabalin (LYRICA) 200 MG capsule Take 200 mg by mouth 3 (three) times daily.   Yes Historical Provider, MD  promethazine (PHENERGAN) 25 MG tablet Take 1 tablet (25 mg total) by mouth every 8 (eight) hours as needed for nausea or vomiting. Patient taking differently: Take 25 mg by mouth every 6 (six) hours as needed for nausea or vomiting.  11/25/13  Yes Volanda Napoleon, MD  temazepam (RESTORIL) 15 MG capsule TAKE 1 CAPSULE BY MOUTH EVERY DAY AT BEDTIME AS NEEDED FOR SLEEP Patient taking differently: TAKE 1 CAPSULE BY MOUTH EVERY DAY AT BEDTIME AS NEEDED FOR SLEEP////07/07/14 per daughter may be repeated x 1 06/30/14  Yes Volanda Napoleon, MD  tiZANidine (ZANAFLEX) 4 MG tablet Take 4 mg by mouth every 6 (six) hours as needed for muscle spasms.   Yes Historical Provider, MD  potassium chloride (K-DUR) 10 MEQ tablet Take 4 tablets (40 mEq total) by mouth daily. Patient not taking:  Reported on 06/29/2014 05/27/14   Volanda Napoleon, MD     Vital Signs: BP 91/36 mmHg  Pulse 96  Temp(Src) 98.6 F (37 C) (Oral)  Resp 23  Ht '5\' 6"'  (1.676 m)  Wt 107.5 kg (236 lb 15.9 oz)  BMI 38.27 kg/m2  SpO2 99%  LMP 08/30/2006  Physical Exam  Constitutional: She is oriented to person, place, and time.  Pulmonary/Chest: She has wheezes.  Still with shortness of breath  Abdominal: Soft. Bowel sounds are normal. There is tenderness.  RLQ pain Site of liver ablation is NT; no bleeding  Musculoskeletal: Normal  range of motion.  Neurological: She is alert and oriented to person, place, and time.  Skin: Skin is warm and dry.  Psychiatric: She has a normal mood and affect. Her behavior is normal. Judgment and thought content normal.    Imaging: Ct Abdomen Wo Contrast  07/11/2014   CLINICAL DATA:  Epigastric pain. Status post microwave ablation 1 day ago.  EXAM: CT ABDOMEN WITHOUT CONTRAST  TECHNIQUE: Multidetector CT imaging of the abdomen was performed following the standard protocol without IV contrast.  COMPARISON:  None.  FINDINGS: Lower chest: There are bilateral moderate pleural effusions. Interstitial thickening is identified within the lungs bilaterally consistent with edema. There are airspace opacities within both lower lobes.  Hepatobiliary: Along the falciform ligament there is a 2.3 cm focal area of low attenuation within the medial segment of left hepatic lobe compatible with ablation defect. Small adjacent focus of hyperattenuation is identified on image number 19/series 2 which measures 1.4 cm. Previous cholecystectomy. The common bile duct is increased in caliber measuring up to 1.2 cm.  Pancreas: Negative  Spleen: Negative  Adrenals/Urinary Tract: The adrenal glands are normal. Again noted are bilateral lobulated kidneys. No obstructive uropathy or other acute finding.  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  Vascular/Lymphatic: Normal appearance of the abdominal aorta. No enlarged retroperitoneal or mesenteric adenopathy.  Other: Ventral abdominal wall hernia is identified which contains small and large bowel loops. The small bowel loops are mildly increased in caliber measuring up to 2.6 cm. There is perihepatic ascites which is new from previous exam.  Musculoskeletal: There are no aggressive lytic or sclerotic bone lesions. Solid fusion of the L2-3 vertebra noted. There is marked degenerative disc disease at the  L3-4 level.  IMPRESSION: 1. No large acute hematoma identified to explain the patient's symptoms. There is a small amount of perihepatic fluid which appears low in attenuation. No complications at the ablation site. 2. Bilateral pleural effusions and interstitial edema consistent with CHF. 3. Bilateral lower lobe airspace opacities which may represent areas of alveolar edema secondary to CHF. Pneumonia not excluded. 4. Ventral abdominal wall hernia which contained loops of small and large bowel. Do small bowel loops within the hernia arm mildly increased in caliber measuring up to 2.6 cm.   Electronically Signed   By: Kerby Moors M.D.   On: 07/11/2014 11:09   Ct Guide Tissue Ablation  07/10/2014   EXAM: IR CT GUIDED TISSUE ABLATION  CT-GUIDED CORE LIVER LESION BIOPSY  Clinical history:  59 year old female with a history of right breast cancer diagnosed 7 years ago. She is status post bilateral mastectomy. Previous percutaneous microwave ablation of solitary medial left hepatic lobe metastasis 01/26/2012. Most recent PET-CT demonstrated new hypermetabolic focus near the ablation zone. She presents today for percutaneous ablation of her hepatic metastatic disease.  Sedation:  General anesthesia  was provided by the anesthesiology service  CONTRAST:  None provided  PROCEDURE: Informed consent was obtained from the patient following explanation of the procedure, risks, benefits and alternatives. The patient understands, agrees and consents for the procedure. All questions were addressed. A time out was performed.  A planning triple phase contrast CT scan was performed to formally assess for additional metastatic disease. The solitary lesion in hepatic segment 4A is again identified. The lesion is minimally changed from the most recent PET-CT. No new metastatic disease is identified. The patient is status post prior cholecystectomy. The visualized spleen, pancreas, kidneys and adrenal glands are unremarkable. The  patient is status post surgical changes of gastric bypass surgery. There is a small midline abdominal ventral hernia containing  small bowel. Right breast prosthesis partially visualized. Dependent atelectasis in the visualized lung bases.  Maximal barrier sterile technique utilized including caps, mask, sterile gowns, sterile gloves, large sterile drape, hand hygiene, and betadine skin prep.  Under CT fluoroscopic guidance, a 17 gauge trocar needle was advanced to the margin of the lesion. Once needle tip position was confirmed, coaxial 18-gauge core biopsy samples were obtained, submitted in formalin to surgical pathology. The guide needle was removed. Using CT fluoroscopic technique, a 20 cm 3.7 cm active tip Covidien microwave antenna was then advanced percutaneously into the lesion. Following axial CT confirmation of appropriate antenna placement, thermal ablation was conducted at 45 watts for 10 minutes. Following the successful ablation protocol, the tract was ablated as the antenna removed. Sterile bandage was applied.  Post ablation CT imaging demonstrated expected changes in the ablations on at the site of the prior lesion. No significant intraperitoneal or perihepatic hemorrhage. No pneumothorax. The patient tolerated the procedure well. There is no immediate complication. She was taken to the PACU where she was extubated.  IMPRESSION:  1. Technically successful core biopsy of solitary hepatic segment 4A lesion. 2. Technically successful CT guided microwave ablation of recurrent solitary hepatic metastasis.   Electronically Signed   By: Lucrezia Europe M.D.   On: 07/10/2014 16:48   Ct Biopsy  07/10/2014   EXAM: IR CT GUIDED TISSUE ABLATION  CT-GUIDED CORE LIVER LESION BIOPSY  Clinical history:  59 year old female with a history of right breast cancer diagnosed 7 years ago. She is status post bilateral mastectomy. Previous percutaneous microwave ablation of solitary medial left hepatic lobe metastasis  01/26/2012. Most recent PET-CT demonstrated new hypermetabolic focus near the ablation zone. She presents today for percutaneous ablation of her hepatic metastatic disease.  Sedation:  General anesthesia was provided by the anesthesiology service  CONTRAST:  None provided  PROCEDURE: Informed consent was obtained from the patient following explanation of the procedure, risks, benefits and alternatives. The patient understands, agrees and consents for the procedure. All questions were addressed. A time out was performed.  A planning triple phase contrast CT scan was performed to formally assess for additional metastatic disease. The solitary lesion in hepatic segment 4A is again identified. The lesion is minimally changed from the most recent PET-CT. No new metastatic disease is identified. The patient is status post prior cholecystectomy. The visualized spleen, pancreas, kidneys and adrenal glands are unremarkable. The patient is status post surgical changes of gastric bypass surgery. There is a small midline abdominal ventral hernia containing  small bowel. Right breast prosthesis partially visualized. Dependent atelectasis in the visualized lung bases.  Maximal barrier sterile technique utilized including caps, mask, sterile gowns, sterile gloves, large sterile drape, hand hygiene, and betadine skin prep.  Under CT fluoroscopic guidance, a 17 gauge trocar needle was advanced to the margin of the lesion. Once needle tip position was confirmed, coaxial 18-gauge core biopsy samples were obtained, submitted in formalin to surgical pathology. The guide needle was removed. Using CT fluoroscopic technique, a 20 cm 3.7 cm active tip Covidien microwave antenna was then advanced percutaneously into the lesion. Following axial CT confirmation of appropriate antenna placement, thermal ablation was conducted at 45 watts for 10 minutes. Following the successful ablation protocol, the tract was ablated as the antenna removed.  Sterile bandage was applied.  Post ablation CT imaging demonstrated expected changes in the ablations on at the site of the prior lesion. No significant intraperitoneal or perihepatic hemorrhage. No pneumothorax. The patient tolerated the procedure well. There is no immediate complication. She was taken to the PACU where she was extubated.  IMPRESSION:  1. Technically successful core biopsy of solitary hepatic segment 4A lesion. 2. Technically successful CT guided microwave ablation of recurrent solitary hepatic metastasis.   Electronically Signed   By: Lucrezia Europe M.D.   On: 07/10/2014 16:48   Dg Chest Port 1 View  07/13/2014   CLINICAL DATA:  Central line placement  EXAM: PORTABLE CHEST - 1 VIEW  COMPARISON:  07/11/2014  FINDINGS: There is a new right jugular central line with tip in the expected location of the low SVC. There is no pneumothorax. Opacities persist throughout both lungs, somewhat more confluent in the central lung regions compared to 07/11/2014.  IMPRESSION: New right-sided central line. No pneumothorax. Worsening airspace opacities.   Electronically Signed   By: Andreas Newport M.D.   On: 07/13/2014 04:05   Dg Chest Port 1 View  07/11/2014   CLINICAL DATA:  59 year old female with chest pain, shortness of breath and dyspnea. History breast cancer.  EXAM: PORTABLE CHEST - 1 VIEW  COMPARISON:  Chest x-ray 02/12/2012.  FINDINGS: Mild diffuse interstitial prominence. Patchy ill-defined airspace disease throughout the left lung. Mild diffuse peribronchial cuffing. No pleural effusions. Findings do not appear to reflect pulmonary edema. No definite suspicious appearing pulmonary nodules or masses. Heart size is borderline enlarged, likely accentuated by the patient's low lung volumes. Upper mediastinal contours are within normal limits.  IMPRESSION: 1. The appearance the chest suggests bronchitis with multilobar bronchopneumonia in the left upper and lower lobes. Repeat standing PA and lateral  chest radiographs are recommended within 2-3 weeks following trial of antimicrobial therapy to ensure resolution of these findings.   Electronically Signed   By: Vinnie Langton M.D.   On: 07/11/2014 14:30    Labs:  CBC:  Recent Labs  07/10/14 1110 07/11/14 1340 07/12/14 0705 07/13/14 0345  WBC 4.8 12.4* 12.4* 14.1*  HGB 11.3* 12.1 12.4 11.2*  HCT 33.5* 35.4* 36.4 33.5*  PLT 203 244 237 197    COAGS:  Recent Labs  07/07/14 1005 07/11/14 1842 07/12/14 0705  INR 1.11 1.21 1.20  APTT 36 29  --     BMP:  Recent Labs  07/10/14 1110 07/11/14 0440 07/12/14 0705 07/13/14 0345  NA 140 140 138 134*  K 3.8 4.6 4.0 4.1  CL 109 107 104 100  CO2 '23 19 23 29  ' GLUCOSE 99 219* 132* 145*  BUN 21 20 24* 33*  CALCIUM 7.6* 7.8* 7.7* 7.3*  CREATININE 1.00 1.14* 1.30* 1.56*  GFRNONAA 60* 52* 44* 35*  GFRAA 70* 60* 51* 41*    LIVER FUNCTION TESTS:  Recent Labs  07/07/14 1005 07/11/14 0440 07/12/14 0705 07/13/14  0345  BILITOT 0.5 1.0 1.9* 2.5*  AST 23 143* 100* 70*  ALT 17 63* 51* 39*  ALKPHOS 89 140* 108 96  PROT 7.1 7.5 6.8 5.7*  ALBUMIN 3.6 3.6 3.0* 2.6*    Assessment and Plan:  Hx breast ca Liver lesion metastasis ablation performed 3/11 in IR Suffered post procedure NSTEMI Now admitted too CCM---following cardiology For heart cath when renal fxn improved Pt stable  Signed: Omare Bilotta A 07/13/2014, 11:39 AM   I spent a total of  15 min in face to face in clinical consultation/evaluation, greater than 50% of which was counseling/coordinating care for liver lesion MW ablation

## 2014-07-13 NOTE — Progress Notes (Addendum)
       Patient Name: Kerri Vasquez Date of Encounter: 07/13/2014    SUBJECTIVE: Patient with right upper quadrant discomfort. Recent interventional liver metastatic lesion ablation. Following procedure she experienced chest tightness and dyspnea. Last evening developed severe hypotension. She also developed some somnolence that was felt related to pain medications in the setting of low cardiac output. She is now on a Narcan drip.  TELEMETRY:  Normal sinus rhythm and sinus tachycardia. Filed Vitals:   07/13/14 0700 07/13/14 0715 07/13/14 0730 07/13/14 0745  BP: 85/47     Pulse: 78 91 91 84  Temp:      TempSrc:      Resp: 20 19 20 20   Height:      Weight:      SpO2: 100% 100% 100% 100%    Intake/Output Summary (Last 24 hours) at 07/13/14 0800 Last data filed at 07/13/14 0700  Gross per 24 hour  Intake 1684.92 ml  Output   3115 ml  Net -1430.08 ml   LABS: Basic Metabolic Panel:  Recent Labs  07/12/14 0705 07/13/14 0345  NA 138 134*  K 4.0 4.1  CL 104 100  CO2 23 29  GLUCOSE 132* 145*  BUN 24* 33*  CREATININE 1.30* 1.56*  CALCIUM 7.7* 7.3*   CBC:  Recent Labs  07/12/14 0705 07/13/14 0345  WBC 12.4* 14.1*  NEUTROABS 11.1*  --   HGB 12.4 11.2*  HCT 36.4 33.5*  MCV 95.0 96.5  PLT 237 197   Cardiac Enzymes:  Recent Labs  07/11/14 1340 07/11/14 1842 07/12/14 0705  CKTOTAL 926*  --  687*  CKMB 79.6*  --  24.4*  TROPONINI 16.91* 18.76* 9.43*   BNP    Component Value Date/Time   BNP 3001.6* 07/11/2014 1340     Radiology/Studies:  Worsening airspace disease compared to 07/11/14  Physical Exam: Blood pressure 85/47, pulse 84, temperature 98.9 F (37.2 C), temperature source Oral, resp. rate 20, height 5\' 6"  (1.676 m), weight 236 lb 15.9 oz (107.5 kg), last menstrual period 08/30/2006, SpO2 100 %. Weight change: -5 lb 15.2 oz (-2.7 kg)  Wt Readings from Last 3 Encounters:  07/13/14 236 lb 15.9 oz (107.5 kg)  06/29/14 243 lb (110.224 kg)    06/02/14 248 lb (112.492 kg)   Lying flat and denies dyspnea. Decreased breath sounds at the bases. Cardiac exam reveals distant heart sounds. No obvious gallop. No peripheral edema is noted. Neurological exam is intact.  ASSESSMENT:  1. Acute systolic heart failure with cardiogenic shock  of uncertain cause. Likely related to stress cardiomyopathy. Rule out coronary artery disease. Rule out underlying component related to chemotherapy toxicity. 2. Narcotic overdose. Currently on a Narcan drip. 3. Acute on chronic kidney injury 4. Pain syndrome 5. Metastatic breast cancer with liver involvement.  Plan:  1. Hydrate as needed to support blood pressure 2. Await metabolism of opiate therapy and discontinue Narcan drip 3. ?Coronary angiography today or deferred until a.m., awaiting improvement in renal function. 4. Heart failure team consult    Signed, Sinclair Grooms 07/13/2014, 8:00 AM

## 2014-07-13 NOTE — Procedures (Signed)
Central Venous Catheter Insertion Procedure Note Miki Labuda [id number] [birth year]  Procedure: Insertion of Central Venous Catheter Indications: Drug and/or fluid administration  Procedure Details Consent: Unable to obtain consent because of altered level of consciousness. Time Out: Verified patient identification, verified procedure, site/side was marked, verified correct patient position, special equipment/implants available, medications/allergies/relevent history reviewed, required imaging and test results available.  Performed  Maximum sterile technique was used including antiseptics, cap, gloves, gown, hand hygiene, mask and sheet. Skin prep: Chlorhexidine; local anesthetic administered A antimicrobial bonded/coated triple lumen catheter was placed in the right internal jugular vein using the Seldinger technique.  Evaluation Blood flow good Complications: No apparent complications Patient did tolerate procedure well. Chest X-ray ordered to verify placement.  CXR: pending.  Arick Mareno, [place] 07/13/2014, 3:38 AM

## 2014-07-13 NOTE — Procedures (Signed)
Name: Analaya Hoey MRN: [id number] DOB: Sep 21, 1955  DOS:  PROCEDURE NOTE  Procedure:  Arterial catheter placement.  Indications:  Need for invasive hemodynamic monitoring / frequent arterial blood gases measurement.  Consent:  Consent was implied due to the emergency nature of the procedure.  Procedure summary:  The patient was identified as Kerri Vasquez and safety timeout was performed. Collateral arterial flow was confirmed by performing Allen's test. Sterile technique was used. The patient's right wrist was prepped using chlorhexidine / alcohol scrub and the field was draped in usual sterile fashion with protective barrier. The right radial artery was cannulated without difficulty. Blood was aspirated and the catheter was flushed with normal saline without difficulty. Good arterial waveform was obtained. The catheter was secured into place with sterile dressing.  Complications:  No immediate complications were noted.  Estimated blood loss:  Less then 5 mL  Alric Quan, M.D. Pulmonary and [place] Cell: [phone] Pager: [phone]  07/13/2014, 3:37 AM

## 2014-07-13 NOTE — Progress Notes (Addendum)
  [place] TEAM 1 - Stepdown/ICU TEAM  Pt was scheduled to be transferred to the [facility] service this AM.  Noted pt was placed on levophed last night.  As per the ICU rules established by Dr. Lavon Paganini. Titus Mould, [facility] is not allowed to attend on patients on pressors.  As a result the attending team has been changed back to PCCM.  Discussed via telephone w/ Dr. Chase Caller.  Cherene Altes, MD [facility] For Consults/Admissions - Flow Manager [phone] Office  [phone]  Contact MD directly via text page:      [email or web address]      password [facility]

## 2014-07-13 NOTE — Progress Notes (Signed)
ANTICOAGULATION CONSULT NOTE - [place] for heparin Indication: NSTEMI  Allergies  Allergen Reactions  . Influenza Vac Split [Flu Virus Vaccine] Other (See Comments)    Joint stiffness and renal failure  . Ritalin [Methylphenidate Hcl]     "Heart attack"  . Zostavax [Zoster Vaccine Live ([employer])] Other (See Comments)    Joint stiffness, renal failure  . Adhesive [Tape] Itching and Rash    Patient Measurements: Height: 5\' 6"  (167.6 cm) Weight: 236 lb 15.9 oz (107.5 kg) IBW/kg (Calculated) : 59.3 Heparin Dosing Weight: 90kg  Vital Signs: Temp: 98.2 F (36.8 C) (03/14 1215) Temp Source: Oral (03/14 1215) BP: 82/62 mmHg (03/14 1315) Pulse Rate: 90 (03/14 1330)  Labs:  Recent Labs  07/11/14 0440  07/11/14 1340 07/11/14 1842  07/12/14 0705 07/12/14 1811 07/13/14 0243 07/13/14 0345 07/13/14 1049  HGB  --   < > 12.1  --   --  12.4  --   --  11.2*  --   HCT  --   --  35.4*  --   --  36.4  --   --  33.5*  --   PLT  --   --  244  --   --  237  --   --  197  --   APTT  --   --   --  29  --   --   --   --   --   --   LABPROT  --   --   --  15.4*  --  15.3*  --   --   --   --   INR  --   --   --  1.21  --  1.20  --   --   --   --   HEPARINUNFRC  --   --   --   --   < > 0.12* 0.19* <0.10*  --  0.23*  CREATININE 1.14*  --   --   --   --  1.30*  --   --  1.56*  --   CKTOTAL  --   --  926*  --   --  687*  --   --   --   --   CKMB  --   --  79.6*  --   --  24.4*  --   --   --   --   TROPONINI  --   --  16.91* 18.76*  --  9.43*  --   --   --   --   < > = values in this interval not displayed.  Estimated Creatinine Clearance: 48.2 mL/min (by C-G formula based on Cr of 1.56).   Medical History: Past Medical History  Diagnosis Date  . Depression   . Hypertension   . Migraine   . Hypothyroidism   . Incisional hernia   . Spinal stenosis   . Scoliosis   . Sciatica   . Intestinal obstruction   . GERD (gastroesophageal reflux disease)   . Septic  arthritis 01/28/10    and spinal stenosis , moderate scoliosis   . Osteoarthritis of multiple joints   . Anemia   . Pernicious anemia 05/30/2011    Iron transfusion and VB12 on 06/06/11  . Sleep apnea     hx of off machine x 4 years   . Diabetes mellitus     type II, controlled by diet 05/25/11   . Renal insufficiency  hx renal failure 2011  . Fibromyalgia   . Fracture of multiple toes     right toes x 4 toes- excluding small toe    Medications:  . heparin 1,250 Units/hr     Assessment: 59yo female was admitted to [facility] on 3/11 for liver ablation for metastasis, then developed significant nausea and severe aching pain in RUQ as well as intermittent substernal discomfort, troponin found to be significantly elevated peaked 19> tx'd to [facility] ICU for further eval. Heparin started  for NSTEMI.  Heparin level 0.23 remains low despite earlier rate increase to 1650 units/hr.  Spoke with RN, no trouble with IV/infusion.  Goal of Therapy:  Heparin level 0.3-0.7 units/ml Monitor platelets by anticoagulation protocol: Yes   Plan:  -Increase IV heparin to 1800 units/hr. -Recheck heparin level in 6 hrs -Daily heparin level and CBC -F/u plans for further cardiac workup  Bonnita Nasuti Pharm.D. CPP, BCPS Clinical Pharmacist [phone] 07/13/2014 1:44 PM

## 2014-07-13 NOTE — Progress Notes (Signed)
INITIAL NUTRITION ASSESSMENT  DOCUMENTATION CODES Per approved criteria  -Obesity Unspecified   INTERVENTION: Provide Glucerna Shake po BID, each supplement provides 220 kcal and 10 grams of protein.  Encourage adequate PO intake.  NUTRITION DIAGNOSIS: Inadequate oral intake related to decreased appetite as evidenced by meal completion of 10%.   Goal: Pt to meet >/= 90% of their estimated nutrition needs   Monitor:  Diet advancement, weight trends, labs, I/O's  Reason for Assessment: MST  59 y.o. female  Admitting Dx: <principal problem not specified>  ASSESSMENT: Pt with a history of severe morbid obesity s/p her gastric bypass. Had breast cancer treated s/p bilateral mastectomy and chemotherapy and had a liver recurrence 2013. She was admitted 3/11 to [facility] for an ablation. Presented [facility] 3/12 with significant nausea and had severe aching pain in her right upper quadrant but also had substernal chest discomfort intermittent upper chest.  Current plans for cardiac cath when renal functions improves. Pt has been just advanced to a Heat healthy/carbohydrate modified diet. Pt reports having a lack of appetite which has been ongoing since admission. PTA pt reports eating fine with multiple small meals a day (due to bypass surgery) and no other difficulties. Pt reports usual body weight of 242 lbs, however noted pt with a 13.8% weight loss in 10 months. Current meal completion is 10%. Pt is agreeable to trying Glucerna Shake. RD to order. Pt was educated to continue with supplementation at home if appetite continues to be poor.   Additionally, pt and daughter requests information on a HH/Carb mod diet. RD to give information during next visit.   Pt with no observed significant fat or muscle mass loss.   Labs: Low sodium, calcium, and GFR. High BUN, creatinine, AST, ALT, total bilirubin.  Height: Ht Readings from Last 1 Encounters:  07/11/14 5\' 6"  (1.676 m)    Weight: Wt  Readings from Last 1 Encounters:  07/13/14 236 lb 15.9 oz (107.5 kg)    Ideal Body Weight: 130 lbs  % Ideal Body Weight: 180%  Wt Readings from Last 10 Encounters:  07/13/14 236 lb 15.9 oz (107.5 kg)  06/29/14 243 lb (110.224 kg)  06/02/14 248 lb (112.492 kg)  05/14/14 248 lb (112.492 kg)  04/16/14 241 lb (109.317 kg)  03/18/14 248 lb (112.492 kg)  02/16/14 251 lb (113.853 kg)  11/25/13 257 lb (116.574 kg)  09/15/13 274 lb (124.286 kg)  08/22/13 268 lb (121.564 kg)    Usual Body Weight: 242 lbs  % Usual Body Weight: 98%  BMI:  Body mass index is 38.27 kg/(m^2). Class II obesity  Estimated Nutritional Needs: Kcal: 2050-2250 Protein: 110-125 grams Fluid: 2 - 2.2 L/day  Skin: +1 LE edema  Diet Order: Diet NPO time specified Except for: Sips with Meds  EDUCATION NEEDS: -Education needs addressed   Intake/Output Summary (Last 24 hours) at 07/13/14 0922 Last data filed at 07/13/14 0900  Gross per 24 hour  Intake 1779.42 ml  Output   3285 ml  Net -1505.58 ml    Last BM: 3/10  Labs:   Recent Labs Lab 07/11/14 0440 07/12/14 0705 07/13/14 0345  NA 140 138 134*  K 4.6 4.0 4.1  CL 107 104 100  CO2 19 23 29   BUN 20 24* 33*  CREATININE 1.14* 1.30* 1.56*  CALCIUM 7.8* 7.7* 7.3*  GLUCOSE 219* 132* 145*    CBG (last 3)   Recent Labs  07/12/14 2008 07/12/14 2156 07/13/14 0852  GLUCAP 154* 109* 115*  Scheduled Meds: . antiseptic oral rinse  7 mL Mouth Rinse BID  . [START ON 07/14/2014] aspirin EC  81 mg Oral Daily  . docusate sodium  100 mg Oral BID  . FLUoxetine  40 mg Oral QAC breakfast  . insulin aspart  0-9 Units Subcutaneous TID WC  . levothyroxine  300 mcg Oral QAC breakfast  . methocarbamol  750 mg Oral QID  . pantoprazole  40 mg Oral Daily  . pregabalin  200 mg Oral TID  . sodium chloride  10-40 mL Intracatheter Q12H  . sodium chloride  3 mL Intravenous Q12H    Continuous Infusions: . sodium chloride    . heparin 1,650 Units/hr  (07/13/14 0900)  . naLOXone [facility]) adult infusion for OVERDOSE 0.25 mg/hr (07/13/14 0900)  . norepinephrine (LEVOPHED) Adult infusion 16 mcg/min (07/13/14 0900)    Past Medical History  Diagnosis Date  . Depression   . Hypertension   . Migraine   . Hypothyroidism   . Incisional hernia   . Spinal stenosis   . Scoliosis   . Sciatica   . Intestinal obstruction   . GERD (gastroesophageal reflux disease)   . Septic arthritis 01/28/10    and spinal stenosis , moderate scoliosis   . Osteoarthritis of multiple joints   . Anemia   . Pernicious anemia 05/30/2011    Iron transfusion and VB12 on 06/06/11  . Sleep apnea     hx of off machine x 4 years   . Diabetes mellitus     type II, controlled by diet 05/25/11   . Renal insufficiency     hx renal failure 2011  . Fibromyalgia   . Fracture of multiple toes     right toes x 4 toes- excluding small toe    Past Surgical History  Procedure Laterality Date  . Tonsillectomy  1969  . Multiple extractions with alveoloplasty  06/08/2011    Procedure: MULTIPLE EXTRACION WITH ALVEOLOPLASTY;  Surgeon: Lenn Cal, DDS;  Location: [facility] ORS;  Service: Oral Surgery;  Laterality: N/A;  Extraction of tooth #'s 3,4,7 with alveoloplasty and Gross Debridement of Remaining Teeth  . Gastric bypass  2006  . Intestinal blockage  2011    surgery for this   . Breast surgery  2008     bilateral mastectomy  . Breast reconstruction  2008, 2009  . Cholecystectomy  06/29/85  . Radio frequency ablation to liver  sept 27, 2013  . Thyroid lobectomy  02/16/2012    Procedure: THYROID LOBECTOMY;  Surgeon: Earnstine Regal, MD;  Location: [facility];  Service: General;  Laterality: Right;    Kallie Locks, MS, RD, LDN Pager # [phone] After hours/ weekend pager # [phone]

## 2014-07-13 NOTE — Progress Notes (Signed)
  HF team asked to see Kerri Vasquez.  She is 59 y/o woman with obesity s/p gastric bypass, HTN and breast CA. Underwent chemo and bilateral mastectomies in 2008. It sounds like her chemotherapy was Adriamycin/Cytoxan followed by Taxotere. She was then put on tamoxifen for year and then Femara. She had a previous liver lesion that was ablated. 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 Procedure c/b acute CP and dyspnea with hypotension. Trop peaked at 17. ECHO with EF 20-25% with RWMA. Echo in 2011 showed normal EF. Was on neo and levophed. Neo now off. Levophed down to 16.   Denies recurrent CP os SOB. SBP 115 on levophed. Exam is relatively normal. She is warm. No s3. No significant edema. Remains on Narcan drip due to oversedation from pain meds. Cr up 1.1-> 1.5  She appears to have resolving cardiogenic shock.   History very concerning for ischemic etiology but ECG ok. Could also have late-onset Adriamycin toxicity or stress-induced CM,  Will continue supportive care. Wean levophed as tolerated. Continue ASA, heparin. No b-blocker due to hypotension. No statin just yet with recent liver ablation.   Will check co-ox and CVP.   Plan cath when renal function improved.   AHF team will follow.    Curly Mackowski,MD 9:16 AM

## 2014-07-13 NOTE — H&P (Signed)
PULMONARY / CRITICAL CARE MEDICINE   Name: Kerri Vasquez MRN: [id number] DOB: 12-Apr-1956    ADMISSION DATE:  07/10/2014 CONSULTATION DATE:  07/11/14  REFERRING MD :      CHIEF COMPLAINT:  Nausea and diaphoresis  INITIAL PRESENTATION:  This is a 59 year old female has a history of severe morbid obesity s/p her gastric bypass. Had breast cancer treated s/p bilateral mastectomy and chemotherapy in [place] and had a liver recurrence which was solitary treated with ablation in 2013. She was admitted yesterday in [place] for an ablation. Post ablation on 07/11/2014 she had significant nausea and had severe aching pain in her right upper quadrant but also had substernal chest discomfort intermittent upper chest is persisted since last night. Vomited 4-5 times, severe diaphoresis, and has bilateral pleural effusions and interstitial edema consistent with heart failure. The troponin was elevated at 16.  She had a normal echocardiogram in 2011 and apparently today she had a repeat Echo she has an very depressed global EF (maybe 15-20%). She got Zosyn in [place] in the acute setting, She reports no fever, no chills, no productive cough. Dyspnea is stable.. She got IV diuretic and BB, apparently at one point her BP was a little low however her BP was being measured in her calf. Does have a little RUQ pain after the ablation. Has minimal shin edema. No calf tenderness.   STUDIES:  07/10/14: liver ablation for metastasis 07/11/14: transferred to [facility] for NSTEMI and criculatory shock with EF 25%  SUBJECTIVE:  07/13/2014 remails in ICU. Overnight got unresponsive in context of baseline home oxycontin XR 20mg  and schedule nocturnal tizanadine 4pm at 10pm and Rx with narcan gtt since 11pm last night. Also, on pressors since midnight.  Noted: Daughter says that normally when patient takes tizanadine patient sleeps deeply for several hours. ALSO RITALIN STARTED NEW 2 weeks AGO   VITAL  SIGNS: Temp:  [98.4 F (36.9 C)-98.9 F (37.2 C)] 98.6 F (37 C) (03/14 0745) Pulse Rate:  [72-115] 94 (03/14 1045) Resp:  [14-33] 28 (03/14 1045) BP: (33-112)/(13-71) 110/51 mmHg (03/14 1000) SpO2:  [92 %-100 %] 99 % (03/14 1045) Arterial Line BP: (80-126)/(40-65) 113/58 mmHg (03/14 1045) Weight:  [107.5 kg (236 lb 15.9 oz)] 107.5 kg (236 lb 15.9 oz) (03/14 0338) HEMODYNAMICS: CVP:  [5 mmHg-8 mmHg] 5 mmHg VENTILATOR SETTINGS:   INTAKE / OUTPUT:  Intake/Output Summary (Last 24 hours) at 07/13/14 1052 Last data filed at 07/13/14 1050  Gross per 24 hour  Intake 1773.92 ml  Output   3610 ml  Net -1836.08 ml    PHYSICAL EXAMINATION: General:  Awake, alert, oriented x 3, minimal distress at rest Neuro:  No focal weakness, no facial droop, speech normal HEENT:  Atraumatic, no stridor Cardiovascular:  RRR, no loud murmur Lungs:  Bibasilar inspiratory rales, no wheeze Abdomen:  Aoft, no guarding, has direct tendeness at RUQ and epigastric area Musculoskeletal:  Trace shin edema, no ankle edema Skin:  No rash, no ecchymosis  LABS:  PULMONARY  Recent Labs Lab 07/13/14 0910  O2SAT 73.6    CBC  Recent Labs Lab 07/11/14 1340 07/12/14 0705 07/13/14 0345  HGB 12.1 12.4 11.2*  HCT 35.4* 36.4 33.5*  WBC 12.4* 12.4* 14.1*  PLT 244 237 197    COAGULATION  Recent Labs Lab 07/07/14 1005 07/11/14 1842 07/12/14 0705  INR 1.11 1.21 1.20    CARDIAC   Recent Labs Lab 07/11/14 1340 07/11/14 1842 07/12/14 0705  TROPONINI 16.91* 18.76* 9.43*  No results for input(s): PROBNP in the last 168 hours.   CHEMISTRY  Recent Labs Lab 07/07/14 1005 07/10/14 1110 07/11/14 0440 07/12/14 0705 07/13/14 0345  NA 141 140 140 138 134*  K 4.3 3.8 4.6 4.0 4.1  CL 110 109 107 104 100  CO2 23 23 19 23 29   GLUCOSE 89 99 219* 132* 145*  BUN 36* 21 20 24* 33*  CREATININE 1.22* 1.00 1.14* 1.30* 1.56*  CALCIUM 6.5* 7.6* 7.8* 7.7* 7.3*   Estimated Creatinine Clearance: 48.2  mL/min (by C-G formula based on Cr of 1.56).   LIVER  Recent Labs Lab 07/07/14 1005 07/11/14 0440 07/11/14 1842 07/12/14 0705 07/13/14 0345  AST 23 143*  --  100* 70*  ALT 17 63*  --  51* 39*  ALKPHOS 89 140*  --  108 96  BILITOT 0.5 1.0  --  1.9* 2.5*  PROT 7.1 7.5  --  6.8 5.7*  ALBUMIN 3.6 3.6  --  3.0* 2.6*  INR 1.11  --  1.21 1.20  --      INFECTIOUS No results for input(s): LATICACIDVEN, PROCALCITON in the last 168 hours.   ENDOCRINE CBG (last 3)   Recent Labs  07/12/14 2008 07/12/14 2156 07/13/14 0852  GLUCAP 154* 109* 115*         IMAGING x48h Dg Chest Port 1 View  07/13/2014   CLINICAL DATA:  Central line placement  EXAM: PORTABLE CHEST - 1 VIEW  COMPARISON:  07/11/2014  FINDINGS: There is a new right jugular central line with tip in the expected location of the low SVC. There is no pneumothorax. Opacities persist throughout both lungs, somewhat more confluent in the central lung regions compared to 07/11/2014.  IMPRESSION: New right-sided central line. No pneumothorax. Worsening airspace opacities.   Electronically Signed   By: Andreas Newport M.D.   On: 07/13/2014 04:05   Dg Chest Port 1 View  07/11/2014   CLINICAL DATA:  59 year old female with chest pain, shortness of breath and dyspnea. History breast cancer.  EXAM: PORTABLE CHEST - 1 VIEW  COMPARISON:  Chest x-ray 02/12/2012.  FINDINGS: Mild diffuse interstitial prominence. Patchy ill-defined airspace disease throughout the left lung. Mild diffuse peribronchial cuffing. No pleural effusions. Findings do not appear to reflect pulmonary edema. No definite suspicious appearing pulmonary nodules or masses. Heart size is borderline enlarged, likely accentuated by the patient's low lung volumes. Upper mediastinal contours are within normal limits.  IMPRESSION: 1. The appearance the chest suggests bronchitis with multilobar bronchopneumonia in the left upper and lower lobes. Repeat standing PA and lateral  chest radiographs are recommended within 2-3 weeks following trial of antimicrobial therapy to ensure resolution of these findings.   Electronically Signed   By: Vinnie Langton M.D.   On: 07/11/2014 14:30         ASSESSMENT / PLAN:  PULMONARY A: bilateral lung edema and effusion, likely from new systolic HF. Unlikely PNA, no clinical clues for it P:   Incentive spirometry while in bed   CARDIOVASCULAR A:  NSTEMI and new HF - DDx per Cards - Adriamycin, Takasubo, CAD. Also NOTED TO HAVE STARTED RITALIN 2 weeks prior to NSTEMI   - Now on perssors following oxycontin and tizanadine for pain/muscle spasm - > 40% association of low bp with tizanadine    P:  Pressors titrate down  RENAL A:  CKD stage 2-3 P:   Avoid nephrotoxic meds Monitor electrolytes  GASTROINTESTINAL A:  Vomiting likely part of NSTEMI symptomatology  P:   Zofran PRN PO intake as tolerated  HEMATOLOGIC A:   Known breast CA with liver mets, s.p ablation 07/10/14 Chronic anemia Leukocytosis from acute stress P:  No acute intervention. S/p ablation on 07/10/14  INFECTIOUS A:  Unlikely PNA P:   BCx2  UC  Sputum Abx: zosyn 07/11/14 >>07/11/14  ENDOCRINE A:  hypothyroidism P:   Continue levothyroxine Blood sugar target 140-180. Check HbA1c  NEUROLOGIC A:  Chronic CNS polypharmacy  - Prozac 40mg  ,  ritailin 5mg  bid (cancer fatigue), oxyIR 5mg  q4h prn but taking it schedule, percocet 10/325 q4h scheduled, oxycontin LA 20mg  bid, Lyrical 200mg  tid but taking it bid, restoril 15mg  prn but not taking, zanaflex 4mg  q6h prn but taking it every other night, robaxin 750mg  qid but takes it tid,    - Patient lives in chronic pain at > 6 at al ltimes despite 3 drugs   - new ritalin for fatigue - developed NSTEMI 2 weeks later  - multiple muscule relaxants -    - somnolence 07/12/14 pm after tizanadine and oxycontin. Needing narcan gtt for 12h P:   DC NArcan gtt Monitor off narcan gtt Reduce scheduled  oxycontin to half dose 10mg  bid DC RITALIN due to NSTEMI REDUCE TIZANADINE to night 1mg  (75% reducion) due to low bp (patient insists on needing this drug at night) Continue lyrica but reduce to 200mg  bid home dose (might need further reduction in low gfr) Continue depression meds - prozac 40mg  Palliative care consult for pain and symptoms - d/w daughter - she is ok with this - ? Do methadone  RASS goal: 0    FAMILY  - Updates: keep in ICU  - Inter-disciplinary family meet or Palliative Care meeting due by:      TODAY'S SUMMARY: ADR with sonolence to CNS meds - doses adjusted to help her balance symptioms and safety. Pall medicine for pain mgmt called. Keep in ICU on PCCM     The patient is critically ill with multiple organ systems failure and requires high complexity decision making for assessment and support, frequent evaluation and titration of therapies, application of advanced monitoring technologies and extensive interpretation of multiple databases.   Critical Care Time devoted to patient care services described in this note is  45  Minutes. This time reflects time of care of this signee Dr Brand Males. This critical care time does not reflect procedure time, or teaching time or supervisory time of PA/NP/Med student/Med Resident etc but could involve care discussion time    Dr. Brand Males, M.D., [facility].C.P Pulmonary and Critical Care Medicine Staff Physician [facility] Pager: [phone], If no answer or between  15:00h - 7:00h: call [phone]  07/13/2014 11:30 AM

## 2014-07-13 NOTE — Progress Notes (Signed)
[place] for heparin Indication: NSTEMI  Allergies  Allergen Reactions  . Influenza Vac Split [Flu Virus Vaccine] Other (See Comments)    Joint stiffness and renal failure  . Ritalin [Methylphenidate Hcl]     "Heart attack"  . Zostavax [Zoster Vaccine Live ([employer])] Other (See Comments)    Joint stiffness, renal failure  . Adhesive [Tape] Itching and Rash    Patient Measurements: Height: 5\' 6"  (167.6 cm) Weight: 236 lb 15.9 oz (107.5 kg) IBW/kg (Calculated) : 59.3 Heparin Dosing Weight: 90kg  Vital Signs: Temp: 97.9 F (36.6 C) (03/14 1929) Temp Source: Oral (03/14 1929) BP: 113/53 mmHg (03/14 2000) Pulse Rate: 90 (03/14 2000)  Labs:  Recent Labs  07/11/14 0440  07/11/14 1340 07/11/14 1842 07/12/14 0705  07/13/14 0243 07/13/14 0345 07/13/14 1049 07/13/14 2000  HGB  --   < > 12.1  --  12.4  --   --  11.2*  --   --   HCT  --   --  35.4*  --  36.4  --   --  33.5*  --   --   PLT  --   --  244  --  237  --   --  197  --   --   APTT  --   --   --  29  --   --   --   --   --   --   LABPROT  --   --   --  15.4* 15.3*  --   --   --   --   --   INR  --   --   --  1.21 1.20  --   --   --   --   --   HEPARINUNFRC  --   --   --   --  0.12*  < > <0.10*  --  0.23* 0.23*  CREATININE 1.14*  --   --   --  1.30*  --   --  1.56*  --   --   CKTOTAL  --   --  926*  --  687*  --   --   --   --   --   CKMB  --   --  79.6*  --  24.4*  --   --   --   --   --   TROPONINI  --   --  16.91* 18.76* 9.43*  --   --   --   --   --   < > = values in this interval not displayed.  Estimated Creatinine Clearance: 48.2 mL/min (by C-G formula based on Cr of 1.56).   Medications:  . heparin 1,250 Units/hr     Assessment: 59yo female was admitted to [facility] on 3/11 for liver ablation for metastasis, then developed significant nausea and severe aching pain in RUQ as well as intermittent substernal discomfort, troponin found to be significantly elevated peaked  19> tx'd to [facility] ICU for further eval. Heparin started  for NSTEMI.  Heparin level again low this evening at 0.23 despite earlier rate increase to 1800 units/hr.    Goal of Therapy:  Heparin level 0.3-0.7 units/ml Monitor platelets by anticoagulation protocol: Yes   Plan:  -Increase IV heparin to 2000 units/hr. -Recheck heparin level in 6 hrs -Daily heparin level and CBC -F/u plans for further cardiac workup  Erin Hearing PharmD., BCPS Clinical Pharmacist Pager [phone] 07/13/2014 9:44 PM

## 2014-07-14 ENCOUNTER — Encounter (HOSPITAL_COMMUNITY)
Admission: RE | Disposition: A | Discharge status: Home Health | Payer: Self-pay | Source: Ambulatory Visit | Attending: Internal Medicine

## 2014-07-14 ENCOUNTER — Other Ambulatory Visit: Payer: Self-pay | Admitting: Radiology

## 2014-07-14 DIAGNOSIS — I5021 Acute systolic (congestive) heart failure: Secondary | ICD-10-CM

## 2014-07-14 DIAGNOSIS — C50912 Malignant neoplasm of unspecified site of left female breast: Secondary | ICD-10-CM

## 2014-07-14 DIAGNOSIS — R0600 Dyspnea, unspecified: Secondary | ICD-10-CM | POA: Insufficient documentation

## 2014-07-14 DIAGNOSIS — N179 Acute kidney failure, unspecified: Secondary | ICD-10-CM

## 2014-07-14 DIAGNOSIS — I420 Dilated cardiomyopathy: Secondary | ICD-10-CM | POA: Insufficient documentation

## 2014-07-14 DIAGNOSIS — C787 Secondary malignant neoplasm of liver and intrahepatic bile duct: Principal | ICD-10-CM

## 2014-07-14 DIAGNOSIS — G9340 Encephalopathy, unspecified: Secondary | ICD-10-CM

## 2014-07-14 DIAGNOSIS — I2510 Atherosclerotic heart disease of native coronary artery without angina pectoris: Secondary | ICD-10-CM

## 2014-07-14 HISTORY — PX: LEFT HEART CATHETERIZATION WITH CORONARY ANGIOGRAM: SHX5451

## 2014-07-14 LAB — GLUCOSE, CAPILLARY
GLUCOSE-CAPILLARY: 150 mg/dL — AB (ref 70–99)
Glucose-Capillary: 102 mg/dL — ABNORMAL HIGH (ref 70–99)
Glucose-Capillary: 106 mg/dL — ABNORMAL HIGH (ref 70–99)

## 2014-07-14 LAB — POCT I-STAT, CHEM 8
BUN: 25 mg/dL — ABNORMAL HIGH (ref 6–23)
Calcium, Ion: 1.09 mmol/L — ABNORMAL LOW (ref 1.12–1.23)
Chloride: 101 mmol/L (ref 96–112)
Creatinine, Ser: 1 mg/dL (ref 0.50–1.10)
Glucose, Bld: 124 mg/dL — ABNORMAL HIGH (ref 70–99)
HCT: 36 % (ref 36.0–46.0)
HEMOGLOBIN: 12.2 g/dL (ref 12.0–15.0)
Potassium: 3 mmol/L — ABNORMAL LOW (ref 3.5–5.1)
SODIUM: 140 mmol/L (ref 135–145)
TCO2: 22 mmol/L (ref 0–100)

## 2014-07-14 LAB — MAGNESIUM: MAGNESIUM: 2.3 mg/dL (ref 1.5–2.5)

## 2014-07-14 LAB — CBC
HCT: 34.3 % — ABNORMAL LOW (ref 36.0–46.0)
HEMATOCRIT: 32.9 % — AB (ref 36.0–46.0)
Hemoglobin: 10.9 g/dL — ABNORMAL LOW (ref 12.0–15.0)
Hemoglobin: 11.5 g/dL — ABNORMAL LOW (ref 12.0–15.0)
MCH: 31.9 pg (ref 26.0–34.0)
MCH: 32.3 pg (ref 26.0–34.0)
MCHC: 33.1 g/dL (ref 30.0–36.0)
MCHC: 33.5 g/dL (ref 30.0–36.0)
MCV: 96.2 fL (ref 78.0–100.0)
MCV: 96.3 fL (ref 78.0–100.0)
Platelets: 195 10*3/uL (ref 150–400)
Platelets: 177 10*3/uL (ref 150–400)
RBC: 3.42 MIL/uL — ABNORMAL LOW (ref 3.87–5.11)
RBC: 3.56 MIL/uL — ABNORMAL LOW (ref 3.87–5.11)
RDW: 13.9 % (ref 11.5–15.5)
RDW: 13.9 % (ref 11.5–15.5)
WBC: 7.9 10*3/uL (ref 4.0–10.5)
WBC: 7.4 10*3/uL (ref 4.0–10.5)

## 2014-07-14 LAB — BASIC METABOLIC PANEL
Anion gap: 5 (ref 5–15)
BUN: 25 mg/dL — AB (ref 6–23)
CHLORIDE: 102 mmol/L (ref 96–112)
CO2: 32 mmol/L (ref 19–32)
CREATININE: 1.13 mg/dL — AB (ref 0.50–1.10)
Calcium: 7.7 mg/dL — ABNORMAL LOW (ref 8.4–10.5)
GFR, EST AFRICAN AMERICAN: 60 mL/min — AB (ref >90)
GFR, EST NON AFRICAN AMERICAN: 52 mL/min — AB (ref >90)
Glucose, Bld: 114 mg/dL — ABNORMAL HIGH (ref 70–99)
Potassium: 2.9 mmol/L — ABNORMAL LOW (ref 3.5–5.1)
Sodium: 139 mmol/L (ref 135–145)

## 2014-07-14 LAB — CREATININE, SERUM
Creatinine, Ser: 1.06 mg/dL (ref 0.50–1.10)
GFR calc non Af Amer: 56 mL/min — ABNORMAL LOW (ref >90)
GFR, EST AFRICAN AMERICAN: 65 mL/min — AB (ref >90)

## 2014-07-14 LAB — PHOSPHORUS: PHOSPHORUS: 1.5 mg/dL — AB (ref 2.3–4.6)

## 2014-07-14 LAB — LACTIC ACID, PLASMA: LACTIC ACID, VENOUS: 0.7 mmol/L (ref 0.5–2.0)

## 2014-07-14 LAB — HEPARIN LEVEL (UNFRACTIONATED): Heparin Unfractionated: 0.35 IU/mL (ref 0.30–0.70)

## 2014-07-14 SURGERY — LEFT HEART CATHETERIZATION WITH CORONARY ANGIOGRAM
Anesthesia: LOCAL

## 2014-07-14 MED ORDER — HEPARIN SODIUM (PORCINE) 5000 UNIT/ML IJ SOLN: 5000.0000 [IU] | Freq: Three times a day (TID) | INTRAMUSCULAR | Status: DC

## 2014-07-14 MED ORDER — SODIUM CHLORIDE 0.9 % IV SOLN: 1.0000 mL/kg/h | INTRAVENOUS | Status: AC

## 2014-07-14 MED ORDER — POTASSIUM CHLORIDE CRYS ER 20 MEQ PO TBCR
40.0000 meq | EXTENDED_RELEASE_TABLET | ORAL | Status: AC
  Administered 2014-07-14 (×2): 40 meq via ORAL
  Filled 2014-07-14 (×2): qty 2

## 2014-07-14 MED ORDER — ENOXAPARIN SODIUM 60 MG/0.6ML ~~LOC~~ SOLN
60.0000 mg | SUBCUTANEOUS | Status: DC
  Filled 2014-07-14: qty 0.6

## 2014-07-14 MED ORDER — ENOXAPARIN SODIUM 40 MG/0.4ML ~~LOC~~ SOLN
40.0000 mg | SUBCUTANEOUS | Status: DC
  Administered 2014-07-14 – 2014-07-16 (×3): 40 mg via SUBCUTANEOUS
  Filled 2014-07-14 (×5): qty 0.4

## 2014-07-14 MED ORDER — DEXTROSE 5 % IV SOLN
24.0000 mmol | Freq: Once | INTRAVENOUS | Status: AC
  Administered 2014-07-14: 24 mmol via INTRAVENOUS
  Filled 2014-07-14: qty 8

## 2014-07-14 MED ORDER — GI COCKTAIL ~~LOC~~
30.0000 mL | Freq: Once | ORAL | Status: AC
Start: 2014-07-14 — End: 2014-07-14
  Administered 2014-07-14: 30 mL via ORAL
  Filled 2014-07-14: qty 30

## 2014-07-14 NOTE — CV Procedure (Signed)
CARDIAC CATHETERIZATION REPORT  NAME:  Kerri Vasquez   MRN: [id number] DOB:  [birth year]   ADMIT DATE: 07/10/2014 Procedure Date: 07/14/2014  INTERVENTIONAL CARDIOLOGIST: Leonie Man, M.D., MS PRIMARY CARE PROVIDER: No PCP Per Patient PRIMARY CARDIOLOGIST: New to [facility]; Dr. Tamala Julian or Dr. Percival Spanish (if not CHF Service)  PATIENT:  Kerri Vasquez is a 59 y.o. female transferred from [facility] following a prolonged episode of substernal chest pain and positive troponins consistent with non-isolation MI. This was following a percutaneous intervention procedure a metastatic lesion in the liver. She had significant bone elevation of greater than 18.  She had signs and symptoms of acute combined systolic and diastolic heart failure. Echocardiographic evaluation demonstrated EF 20-25% with anteroseptal and anterior as well as inferior hypokinesis/dyskinesis.  She has been hypotensive requiring vasopressor therapy. There is concern for some lower heart failure. Following a brief episode of worsening renal function, the recommendation was to proceed to cardiac catheterization once stable from a renal standpoint in order to determine if is any presence of coronary artery disease versus stress-induced cardiomyopathy which is the leading diagnosis. She now presents for diagnostic right catheterization following IV hydration.  Creatinine this morning had not yet been resulted; therefore, an i-STAT chem she panel was drawn upon obtaining access.  PRE-OPERATIVE DIAGNOSIS:    Per-Procedural NSTEMI  Severely Reduced LVEF - c/w Cardiomyopathy   PROCEDURES PERFORMED:    Left Heart Catheterization with Native Coronary Angiography  via Left Radial Artery   PROCEDURE: The patient was brought to the [facility]nd [facility] in the fasting state and prepped and draped in the usual sterile fashion for Left Radial artery access (has existing R Radial A line). A modified  Allen's test was performed on the Left wrist demonstrating excellent collateral flow for radial access.   Sterile technique was used including antiseptics, cap, gloves, gown, hand hygiene, mask and sheet. Skin prep: Chlorhexidine.   Consent: Risks of procedure as well as the alternatives and risks of each were explained to the (patient/caregiver). Consent for procedure obtained.   Time Out: Verified patient identification, verified procedure, site/side was marked, verified correct patient position, special equipment/implants available, medications/allergies/relevent history reviewed, required imaging and test results available. Performed.  Access:   Left Radial Artery: 6 Fr Sheath -  Seldinger Technique (Angiocath Micropuncture Kit)  Radial Cocktail - 10 mL; IV Heparin 5000 Units   Left Heart Catheterization: 5Fr Catheters advanced or exchanged over a long exchange safety J-wire - under fluoroscopic guidance into the ascending Aorta; JR4 catheter advanced first.  IV Heparin administered upon reaching the ascending aorta. Right Coronary Artery Cineangiography: JR4 Catheter   LV Hemodynamics (LV Gram): JR4 Catheter  Left Coronary Artery Cineangiography: JL4 Catheter Catheter.  Sheath removed in the Cardiac Cath Lab with TR Ban placement for hemostasis.  TR Band: 0825  Hours; 13 mL air  MEDICATIONS:  Anesthesia:  Local Lidocaine 2 ml  Sedation:  1 mg IV Versed, 25 mcg IV fentanyl ;   Omnipaque Contrast: 65 ml  Anticoagulation:  IV Heparin 5000 Units  Anti-Platelet Agent:  5000 Units  FINDINGS:  Hemodynamics:   Central Aortic Pressure / Mean: 102/66/82 mmHg  Left Ventricular Pressure / LVEDP: 101/4/10 mmHg  Left Ventriculography: Deferred  Coronary Anatomy:  Dominance: Right  Left Main: Normal caliber vessel, angiographically normal.  Trifurcates into LAD and Circumflex and Ramus Intermedius.  LAD: Normal caliber vessel that tapers to the apex.  Distal tubular ~30-40%. 2  small to moderate caliber diagonal branches with no significant CAD.  Left Circumflex: Large caliber vessel, minimal luminal irregularities throughout.  2 moderate caliber OM branches from mid AV Groove Circumflex. Terminates as large caliber posterolateral branch with 1 small branch.  No significant CAD.  Ramus intermedius: Small to moderate caliber vessel.  Minimal luminal irregularities   RCA: Normal caliber,dominant vessel.  Terminates distally as a moderate caliber  Right Posterior Descending Artery with ~focal 20-30% mid stenosis.  There is a small Posterolateral AV Groove branch that becomes a very small RPL branch after giving off the AV Nodal branch.  Minimal luminal irregularities throughout the RCA system.  Somewhat High-Anterior takeoff  After reviewing the initial angiography, no culprit lesion was identified.  Preparation were made to proceed with PCI on this lesion.  PATIENT DISPOSITION:    The patient was transferred to the PACU holding area in a hemodynamicaly stable, chest pain free condition.  The patient tolerated the procedure well, and there were no complications.  EBL:   < 5 ml; 3 mL used for i-STAT chemistry panel.  The patient was stable before, during, and after the procedure.  POST-OPERATIVE DIAGNOSIS:    Angiographically minimal CAD with no potential culprit lesion for NSTEMI.  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  Relatively normal LVEDP  ISTAT Cr improved to 1.0. - Correlates with now resulted a.m. labs with creatinine of 1.13.  PLAN OF CARE:  Return to Surgical ICU for continued supportive care per PCCM & Cardiology/CHF Service.  Gentle post-cath hydration for recent mild AKI.    Leonie Man, M.D., M.S. Interventional Cardiologist   Pager # [phone]

## 2014-07-14 NOTE — Progress Notes (Signed)
07/14/2014 1340 Received pt. To room 2w06 from [place].  Obtained report from [place], [place].  Pt. Without c/o at this time.  Oriented to room, call light and bed.  Call bell in reach, family at bedside. [place]

## 2014-07-14 NOTE — Progress Notes (Signed)
ANTICOAGULATION CONSULT NOTE - Follow Up Consult  Pharmacy Consult for heparin Indication: NSTEMI   Labs:  Recent Labs  07/11/14 1340 07/11/14 1842 07/12/14 0705  07/13/14 0345 07/13/14 1049 07/13/14 2000 07/14/14 0453 07/14/14 0455  HGB 12.1  --  12.4  --  11.2*  --   --   --  10.9*  HCT 35.4*  --  36.4  --  33.5*  --   --   --  32.9*  PLT 244  --  237  --  197  --   --   --  195  APTT  --  29  --   --   --   --   --   --   --   LABPROT  --  15.4* 15.3*  --   --   --   --   --   --   INR  --  1.21 1.20  --   --   --   --   --   --   HEPARINUNFRC  --   --  0.12*  < >  --  0.23* 0.23* 0.35  --   CREATININE  --   --  1.30*  --  1.56*  --   --   --   --   CKTOTAL 926*  --  687*  --   --   --   --   --   --   CKMB 79.6*  --  24.4*  --   --   --   --   --   --   TROPONINI 16.91* 18.76* 9.43*  --   --   --   --   --   --   < > = values in this interval not displayed.   Assessment/Plan:  59yo female therapeutic on heparin after multiple rate increases. Will continue gtt at current rate and confirm stable with additional level.   Wynona Neat, PharmD, BCPS  07/14/2014,5:37 AM

## 2014-07-14 NOTE — H&P (View-Only) (Signed)
       Patient Name: Kerri Vasquez Date of Encounter: 07/13/2014    SUBJECTIVE: Patient with right upper quadrant discomfort. Recent interventional liver metastatic lesion ablation. Following procedure she experienced chest tightness and dyspnea. Last evening developed severe hypotension. She also developed some somnolence that was felt related to pain medications in the setting of low cardiac output. She is now on a Narcan drip.  TELEMETRY:  Normal sinus rhythm and sinus tachycardia. Filed Vitals:   07/13/14 0700 07/13/14 0715 07/13/14 0730 07/13/14 0745  BP: 85/47     Pulse: 78 91 91 84  Temp:      TempSrc:      Resp: 20 19 20 20   Height:      Weight:      SpO2: 100% 100% 100% 100%    Intake/Output Summary (Last 24 hours) at 07/13/14 0800 Last data filed at 07/13/14 0700  Gross per 24 hour  Intake 1684.92 ml  Output   3115 ml  Net -1430.08 ml   LABS: Basic Metabolic Panel:  Recent Labs  07/12/14 0705 07/13/14 0345  NA 138 134*  K 4.0 4.1  CL 104 100  CO2 23 29  GLUCOSE 132* 145*  BUN 24* 33*  CREATININE 1.30* 1.56*  CALCIUM 7.7* 7.3*   CBC:  Recent Labs  07/12/14 0705 07/13/14 0345  WBC 12.4* 14.1*  NEUTROABS 11.1*  --   HGB 12.4 11.2*  HCT 36.4 33.5*  MCV 95.0 96.5  PLT 237 197   Cardiac Enzymes:  Recent Labs  07/11/14 1340 07/11/14 1842 07/12/14 0705  CKTOTAL 926*  --  687*  CKMB 79.6*  --  24.4*  TROPONINI 16.91* 18.76* 9.43*   BNP    Component Value Date/Time   BNP 3001.6* 07/11/2014 1340     Radiology/Studies:  Worsening airspace disease compared to 07/11/14  Physical Exam: Blood pressure 85/47, pulse 84, temperature 98.9 F (37.2 C), temperature source Oral, resp. rate 20, height 5\' 6"  (1.676 m), weight 236 lb 15.9 oz (107.5 kg), last menstrual period 08/30/2006, SpO2 100 %. Weight change: -5 lb 15.2 oz (-2.7 kg)  Wt Readings from Last 3 Encounters:  07/13/14 236 lb 15.9 oz (107.5 kg)  06/29/14 243 lb (110.224 kg)    06/02/14 248 lb (112.492 kg)   Lying flat and denies dyspnea. Decreased breath sounds at the bases. Cardiac exam reveals distant heart sounds. No obvious gallop. No peripheral edema is noted. Neurological exam is intact.  ASSESSMENT:  1. Acute systolic heart failure with cardiogenic shock  of uncertain cause. Likely related to stress cardiomyopathy. Rule out coronary artery disease. Rule out underlying component related to chemotherapy toxicity. 2. Narcotic overdose. Currently on a Narcan drip. 3. Acute on chronic kidney injury 4. Pain syndrome 5. Metastatic breast cancer with liver involvement.  Plan:  1. Hydrate as needed to support blood pressure 2. Await metabolism of opiate therapy and discontinue Narcan drip 3. ?Coronary angiography today or deferred until a.m., awaiting improvement in renal function. 4. Heart failure team consult    Signed, Sinclair Grooms 07/13/2014, 8:00 AM

## 2014-07-14 NOTE — Progress Notes (Addendum)
   Patient seen this am by Dr. Tamala Julian. Just back from cath with minimal CAD. LVEDP 10. No LV gram performed.   Renal function improved. K low.   SBP 130-140 on levophed 5.   Plan will be to wean levophed today. Hopefully start low-dose b-blocker and ACE tomorrow. No diuretic needed now with LVEDP 10.   Will stop heparin and start lovenox for DVT prohpylaxis.  Suspect she may have had Tako-Tsubo. HF team will be happy to manage if needed.   Crystalina Stodghill,MD 8:52 AM

## 2014-07-14 NOTE — Interval H&P Note (Signed)
History and Physical Interval Note:  07/14/2014 7:37 AM  Kerri Vasquez  has presented today for surgery, with the diagnosis of NSTEMI  The various methods of treatment have been discussed with the patient and family. After consideration of risks, benefits and other options for treatment, the patient has consented to  Procedure(s): LEFT HEART CATHETERIZATION WITH CORONARY ANGIOGRAM (N/A) +/- PCI as a surgical intervention .  The patient's history has been reviewed, patient examined, no change in status, stable for surgery.  I have reviewed the patient's chart and labs.  Questions were answered to the patient's satisfaction.    Cath Lab Visit (complete for each Cath Lab visit)  Clinical Evaluation Leading to the Procedure:   ACS: Yes.    Non-ACS:    Anginal Classification: CCS IV  Anti-ischemic medical therapy: Minimal Therapy (1 class of medications)  Non-Invasive Test Results: High-risk stress test findings: cardiac mortality >3%/year; based upon Low LVEF on Echo  Prior CABG: No previous CABG  TIMI Score  Patient Information:  TIMI Score is 3   UA/NSTEMI and intermediate-risk features (e.g., TIMI score 3-4) for short-term risk of death or nonfatal MI  Revascularization of the presumed culprit artery   A (9)  Indication: 10; Score: 9   Kerri Vasquez W

## 2014-07-14 NOTE — Progress Notes (Signed)
PULMONARY / CRITICAL CARE MEDICINE   Name: Kerri Vasquez MRN: [id number] DOB: 01-Sep-1955    ADMISSION DATE:  07/10/2014 CONSULTATION DATE:  07/11/14  REFERRING MD :      CHIEF COMPLAINT:  Nausea and diaphoresis  INITIAL PRESENTATION:  This is a 59 year old female has a history of severe morbid obesity s/p her gastric bypass. Had breast cancer treated s/p bilateral mastectomy and chemotherapy in [place] and had a liver recurrence which was solitary treated with ablation in 2013. She was admitted yesterday in [place] for an ablation. Post ablation on 07/11/2014 she had significant nausea and had severe aching pain in her right upper quadrant but also had substernal chest discomfort intermittent upper chest is persisted since last night. Vomited 4-5 times, severe diaphoresis, and has bilateral pleural effusions and interstitial edema consistent with heart failure. The troponin was elevated at 16.  She had a normal echocardiogram in 2011 and apparently today she had a repeat Echo she has an very depressed global EF (maybe 15-20%). She got Zosyn in [place] in the acute setting, She reports no fever, no chills, no productive cough. Dyspnea is stable.. She got IV diuretic and BB, apparently at one point her BP was a little low however her BP was being measured in her calf. Does have a little RUQ pain after the ablation. Has minimal shin edema. No calf tenderness.   STUDIES:  07/10/14: liver ablation for metastasis 07/11/14: transferred to [facility] for NSTEMI and criculatory shock with EF 25%  07/14/2014 remails in ICU. Overnight got unresponsive in context of baseline home oxycontin XR 20mg  and schedule nocturnal tizanadine 4pm at 10pm and Rx with narcan gtt since 11pm last night. Also, on pressors since midnight.  Noted: Daughter says that normally when patient takes tizanadine patient sleeps deeply for several hours. ALSO RITALIN STARTED NEW 2 weeks AGO   SUBJECTIVE/OVERNIGHT/INTERVAL  HX 07/15/14: cath  Angiographically minimal CAD with no potential culprit lesion for NSTEMI. 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  Relatively normal LVEDP  Now off pressors x 1  C.o chronic abd pain  No issue overnight with hyper-somnolence - did not take the lower dose of zanaflex  VITAL SIGNS: Temp:  [97.9 F (36.6 C)-98.7 F (37.1 C)] 98.2 F (36.8 C) (03/15 0915) Pulse Rate:  [72-106] 85 (03/15 0930) Resp:  [12-28] 14 (03/15 0930) BP: (77-133)/(33-71) 133/71 mmHg (03/15 0900) SpO2:  [96 %-100 %] 96 % (03/15 0930) Arterial Line BP: (84-134)/(44-66) 126/59 mmHg (03/15 0930) Weight:  [107.3 kg (236 lb 8.9 oz)] 107.3 kg (236 lb 8.9 oz) (03/15 0500) HEMODYNAMICS: CVP:  [0 mmHg-8 mmHg] 6 mmHg VENTILATOR SETTINGS:   INTAKE / OUTPUT:  Intake/Output Summary (Last 24 hours) at 07/14/14 0956 Last data filed at 07/14/14 0900  Gross per 24 hour  Intake 1061.13 ml  Output   3550 ml  Net -2488.87 ml    PHYSICAL EXAMINATION: General:  Awake, alert, oriented x 3, minimal distress at rest Neuro:  No focal weakness, no facial droop, speech normal. Co chronic abd pain Psych: deperssed afect HEENT:  Atraumatic, no stridor Cardiovascular:  RRR, no loud murmur Lungs:  Bibasilar inspiratory rales, no wheeze Abdomen:  Aoft, no guarding, has direct tendeness at RUQ and epigastric area Musculoskeletal:  Trace shin edema, no ankle edema Skin:  No rash, no ecchymosis  LABS:  PULMONARY  Recent Labs Lab 07/13/14 0910  O2SAT 73.6    CBC  Recent Labs Lab 07/12/14 0705 07/13/14 0345 07/14/14 0455  HGB 12.4 11.2*  10.9*  HCT 36.4 33.5* 32.9*  WBC 12.4* 14.1* 7.9  PLT 237 197 195    COAGULATION  Recent Labs Lab 07/07/14 1005 07/11/14 1842 07/12/14 0705  INR 1.11 1.21 1.20    CARDIAC    Recent Labs Lab 07/11/14 1340 07/11/14 1842 07/12/14 0705  TROPONINI 16.91* 18.76* 9.43*   No results for input(s): PROBNP in the last 168 hours.   CHEMISTRY  Recent  Labs Lab 07/10/14 1110 07/11/14 0440 07/12/14 0705 07/13/14 0345 07/14/14 0455  NA 140 140 138 134* 139  K 3.8 4.6 4.0 4.1 2.9*  CL 109 107 104 100 102  CO2 23 19 23 29  32  GLUCOSE 99 219* 132* 145* 114*  BUN 21 20 24* 33* 25*  CREATININE 1.00 1.14* 1.30* 1.56* 1.13*  CALCIUM 7.6* 7.8* 7.7* 7.3* 7.7*  MG  --   --   --   --  2.3  PHOS  --   --   --   --  1.5*   Estimated Creatinine Clearance: 66.4 mL/min (by C-G formula based on Cr of 1.13).   LIVER  Recent Labs Lab 07/07/14 1005 07/11/14 0440 07/11/14 1842 07/12/14 0705 07/13/14 0345  AST 23 143*  --  100* 70*  ALT 17 63*  --  51* 39*  ALKPHOS 89 140*  --  108 96  BILITOT 0.5 1.0  --  1.9* 2.5*  PROT 7.1 7.5  --  6.8 5.7*  ALBUMIN 3.6 3.6  --  3.0* 2.6*  INR 1.11  --  1.21 1.20  --      INFECTIOUS  Recent Labs Lab 07/14/14  LATICACIDVEN 0.7     ENDOCRINE CBG (last 3)   Recent Labs  07/13/14 1647 07/13/14 2204 07/14/14 0920  GLUCAP 111* 101* 102*         IMAGING x48h Dg Chest Port 1 View  07/13/2014   CLINICAL DATA:  Central line placement  EXAM: PORTABLE CHEST - 1 VIEW  COMPARISON:  07/11/2014  FINDINGS: There is a new right jugular central line with tip in the expected location of the low SVC. There is no pneumothorax. Opacities persist throughout both lungs, somewhat more confluent in the central lung regions compared to 07/11/2014.  IMPRESSION: New right-sided central line. No pneumothorax. Worsening airspace opacities.   Electronically Signed   By: Andreas Newport M.D.   On: 07/13/2014 04:05         ASSESSMENT / PLAN:  PULMONARY A: bilateral lung edema and effusion, likely from new systolic HF. Unlikely PNA, no clinical clues for it P:   Incentive spirometry while in bed   CARDIOVASCULAR A:  NSTEMI and new systolic HF - DDx per Cards - Adriamycin, Takasubo, CAD. Also NOTED TO HAVE STARTED RITALIN 2 weeks prior to NSTEMI   - Cath 07/15/14: clean / Suspect Takasubo primary  consult. Ritalin ADR not ruled out but highly unlikely  - Hypotension 07/14/14 dut to high dose zanaflex - resolved 07/15/14 AM    P:  Dc vasopressors Med mgmt per cards   RENAL A:  CKD stage 2-3; improved Severe low phos + P:   Avoid nephrotoxic meds Monitor electrolytes - replete K phos  GASTROINTESTINAL A:  Vomiting likely part of NSTEMI symptomatology P:   Zofran PRN PO intake as tolerated  HEMATOLOGIC A:   Known breast CA with liver mets, s.p ablation 07/10/14 Chronic anemia Leukocytosis from acute stress P:  No acute intervention.   INFECTIOUS A:  Unlikely PNA P:  BCx2  UC  Sputum Abx: zosyn 07/11/14 >>07/11/14  ENDOCRINE A:  hypothyroidism P:   Continue levothyroxine Blood sugar target 140-180. Check HbA1c  NEUROLOGIC A:  Chronic CNS polypharmacy  - Prozac 40mg  ,  ritailin 5mg  bid (cancer fatigue), oxyIR 5mg  q4h prn but taking it schedule, percocet 10/325 q4h scheduled, oxycontin LA 20mg  bid, Lyrical 200mg  tid but taking it bid, restoril 15mg  prn but not taking, zanaflex 4mg  q6h prn but taking it every other night, robaxin 750mg  qid but takes it tid,    - Patient lives in chronic pain at > 6 at al ltimes despite 3 drugs   - new ritalin for fatigue - developed cardiac event  2 weeks later 07/12/14  - On multiple muscule relaxants -  somnolence 07/12/14 pm after tizanadine and oxycontin. Needed narcan gtt for 12h  Currently 07/14/2014 : improved wakefulness but still with chronic pain  P:   Monitor off narcan gtt Continue Reduce scheduled oxycontin to half dose 10mg  bid but slowly titrate up DC RITALIN due to NSTEMI - list it in [place] to night 1mg  (75% reducion) due to low bp (patient insists on needing this drug at night) Continue lyrica but reduce to 200mg  bid home dose (might need further reduction in low gfr) Continue depression meds - prozac 40mg  Palliative care consult for pain and symptoms - d/w daughter - she is ok with this - ? Do  methadone  RASS goal: 0    FAMILY  - Updates: move to tele. TRiad primary from 07/15/14. Will  D/w Dr Sherral Hammers       Dr. Brand Males, M.D., [facility].C.P Pulmonary and Critical Care Medicine Staff Physician [facility] Pager: [phone], If no answer or between  15:00h - 7:00h: call [phone]  07/14/2014 9:56 AM

## 2014-07-14 NOTE — Progress Notes (Signed)
       Patient Name: Kerri Vasquez Date of Encounter: 07/14/2014    SUBJECTIVE: Improved over night. Not much sleep. BMET from this AM is not yet back. Will need an ISTAT Creat.  TELEMETRY:  NSR Filed Vitals:   07/14/14 0300 07/14/14 0400 07/14/14 0500 07/14/14 0600  BP: 104/54 118/60 103/55 108/50  Pulse: 84 86 80 72  Temp:  98.2 F (36.8 C)    TempSrc:  Oral    Resp: 15 14 12 13   Height:      Weight:   236 lb 8.9 oz (107.3 kg)   SpO2: 98% 99% 97% 97%    Intake/Output Summary (Last 24 hours) at 07/14/14 0748 Last data filed at 07/14/14 0600  Gross per 24 hour  Intake 1122.43 ml  Output   3495 ml  Net -2372.57 ml   LABS: Basic Metabolic Panel:  Recent Labs  07/12/14 0705 07/13/14 0345 07/14/14 0455  NA 138 134*  --   K 4.0 4.1  --   CL 104 100  --   CO2 23 29  --   GLUCOSE 132* 145*  --   BUN 24* 33*  --   CREATININE 1.30* 1.56*  --   CALCIUM 7.7* 7.3*  --   MG  --   --  2.3  PHOS  --   --  1.5*   CBC:  Recent Labs  07/12/14 0705 07/13/14 0345 07/14/14 0455  WBC 12.4* 14.1* 7.9  NEUTROABS 11.1*  --   --   HGB 12.4 11.2* 10.9*  HCT 36.4 33.5* 32.9*  MCV 95.0 96.5 96.2  PLT 237 197 195   Cardiac Enzymes:  Recent Labs  07/11/14 1340 07/11/14 1842 07/12/14 0705  CKTOTAL 926*  --  687*  CKMB 79.6*  --  24.4*  TROPONINI 16.91* 18.76* 9.43*     Radiology/Studies:  No new data  Physical Exam: Blood pressure 108/50, pulse 72, temperature 98.2 F (36.8 C), temperature source Oral, resp. rate 13, height 5\' 6"  (1.676 m), weight 236 lb 8.9 oz (107.3 kg), last menstrual period 08/30/2006, SpO2 97 %. Weight change: -7.1 oz (-0.2 kg)  Wt Readings from Last 3 Encounters:  07/14/14 236 lb 8.9 oz (107.3 kg)  06/29/14 243 lb (110.224 kg)  06/02/14 248 lb (112.492 kg)   No rub or gallop. Lying flat No edema  ASSESSMENT:  1. Acute systolic HF with good diuresis and O2 sats 2. Cardiogenic shock resolving 3. Acute kidney injury, with current  statu unknown. Already on cath table. 4. Metastatic breast cancer  Plan:  1. i-STAT creatinine in lab and if climbing, would delay procedure. 2. Wean Levophed 3. Hydrate if need for renal function 4. If cath today, will need Cors only with limited contrast.  Signed, Sinclair Grooms 07/14/2014, 7:48 AM

## 2014-07-15 ENCOUNTER — Other Ambulatory Visit: Payer: Self-pay | Admitting: Nurse Practitioner

## 2014-07-15 ENCOUNTER — Encounter (HOSPITAL_COMMUNITY): Payer: Self-pay | Admitting: Cardiology

## 2014-07-15 DIAGNOSIS — C787 Secondary malignant neoplasm of liver and intrahepatic bile duct: Principal | ICD-10-CM

## 2014-07-15 DIAGNOSIS — E119 Type 2 diabetes mellitus without complications: Secondary | ICD-10-CM

## 2014-07-15 DIAGNOSIS — Z515 Encounter for palliative care: Secondary | ICD-10-CM

## 2014-07-15 DIAGNOSIS — Z7189 Other specified counseling: Secondary | ICD-10-CM

## 2014-07-15 DIAGNOSIS — G894 Chronic pain syndrome: Secondary | ICD-10-CM

## 2014-07-15 DIAGNOSIS — C50919 Malignant neoplasm of unspecified site of unspecified female breast: Secondary | ICD-10-CM

## 2014-07-15 DIAGNOSIS — R531 Weakness: Secondary | ICD-10-CM

## 2014-07-15 DIAGNOSIS — I5021 Acute systolic (congestive) heart failure: Secondary | ICD-10-CM

## 2014-07-15 LAB — GLUCOSE, CAPILLARY
GLUCOSE-CAPILLARY: 87 mg/dL (ref 70–99)
Glucose-Capillary: 104 mg/dL — ABNORMAL HIGH (ref 70–99)
Glucose-Capillary: 124 mg/dL — ABNORMAL HIGH (ref 70–99)
Glucose-Capillary: 100 mg/dL — ABNORMAL HIGH (ref 70–99)
Glucose-Capillary: 98 mg/dL (ref 70–99)

## 2014-07-15 LAB — BASIC METABOLIC PANEL
Anion gap: 6 (ref 5–15)
BUN: 21 mg/dL (ref 6–23)
CHLORIDE: 104 mmol/L (ref 96–112)
CO2: 28 mmol/L (ref 19–32)
Calcium: 7.7 mg/dL — ABNORMAL LOW (ref 8.4–10.5)
Creatinine, Ser: 1.09 mg/dL (ref 0.50–1.10)
GFR calc Af Amer: 63 mL/min — ABNORMAL LOW (ref >90)
GFR calc non Af Amer: 54 mL/min — ABNORMAL LOW (ref >90)
GLUCOSE: 129 mg/dL — AB (ref 70–99)
Potassium: 3.4 mmol/L — ABNORMAL LOW (ref 3.5–5.1)
Sodium: 138 mmol/L (ref 135–145)

## 2014-07-15 LAB — PHOSPHORUS: Phosphorus: 1.5 mg/dL — ABNORMAL LOW (ref 2.3–4.6)

## 2014-07-15 LAB — MAGNESIUM: Magnesium: 2.4 mg/dL (ref 1.5–2.5)

## 2014-07-15 MED ORDER — SPIRONOLACTONE 12.5 MG HALF TABLET
12.5000 mg | ORAL_TABLET | Freq: Every day | ORAL | Status: DC
  Administered 2014-07-15 – 2014-07-18 (×4): 12.5 mg via ORAL
  Filled 2014-07-15 (×4): qty 1

## 2014-07-15 MED ORDER — POTASSIUM CHLORIDE CRYS ER 20 MEQ PO TBCR
40.0000 meq | EXTENDED_RELEASE_TABLET | Freq: Once | ORAL | Status: AC
  Administered 2014-07-15: 40 meq via ORAL
  Filled 2014-07-15: qty 2

## 2014-07-15 MED ORDER — CARVEDILOL 3.125 MG PO TABS
3.1250 mg | ORAL_TABLET | Freq: Two times a day (BID) | ORAL | Status: DC
  Administered 2014-07-15 – 2014-07-17 (×4): 3.125 mg via ORAL
  Filled 2014-07-15 (×8): qty 1

## 2014-07-15 MED ORDER — OXYCODONE HCL ER 20 MG PO T12A
20.0000 mg | EXTENDED_RELEASE_TABLET | Freq: Two times a day (BID) | ORAL | Status: DC
  Administered 2014-07-15 – 2014-07-17 (×3): 20 mg via ORAL
  Filled 2014-07-15 (×4): qty 2

## 2014-07-15 MED ORDER — ENSURE COMPLETE PO LIQD
237.0000 mL | Freq: Every day | ORAL | Status: DC
  Administered 2014-07-15 – 2014-07-18 (×2): 237 mL via ORAL

## 2014-07-15 NOTE — Consult Note (Signed)
Patient Kerri Vasquez:OEUM Clementeen Hoof      DOB: 1956/04/20      PNT:[id number]     Consult Note from the Palliative Medicine Team at [facility] Requested by: Dr Chase Caller     PCP: No PCP Per Patient Reason for Consultation:Clarification of [place] and options     Phone Number:None  Assessment of patients Current state:    Complicated medical situation, patient and her daughter are trying to sort out information and available medical options.  Goal is to prolong quality life.  Faced with advanced directive decisions and anticipatory care needs   Consult is for review of medical treatment options, clarification of goals of care and, disposition and options, and symptom recommendations as indicated.  This NP Wadie Lessen reviewed medical records, received report from team, assessed the patient and then meet at the patient's bedside along with her daughter Kerri Vasquez to discuss diagnosis, prognosis, GOC,  and options.  A detailed discussion was had today regarding advanced directives.  Concepts specific to code status, artifical feeding and hydration, continued IV antibiotics and rehospitalization was had.  The difference between a aggressive medical intervention path  and a palliative comfort care path  was had.  Values and goals of care important to patient and family were attempted to be elicited.  Concept of  Palliative Care was discussed   Questions and concerns addressed.   Family encouraged to call with questions or concerns.  PMT will continue to support holistically.   Goals of Care: 1.  Code Status:  Full code   2. Scope of Treatment: 1. Patient is open to all offered and available medical interventions to prolong quality life  Patient and her daughter are encouraged to continue conversation regarding code status and advanced directives, in order to enhance patient centered care.  Chaplain to assist with HPOA   2. Consults: Patient will keep her scheduled pain clinic  appointment on March 23 with Dr Hardin Negus                                       Scheduled Oncology appointment with Dr Marin Olp on March 30th                                       [facility] with cardiology Lymphedema clinic- I spoke to Dr Antonieta Pert office and referral has been sent                                                 3. Disposition:  Home with [facility] for PT/OT when medically stable.  Needs PT evaluation, foley will need to be dc.   4. Symptom Management:    1. Pain: Patient reports that her pain is currently poorly controlled, 9/10               -increase her Oxycodone ER to 20 mg po bid (home dose), she will see her pain clinic next week                as scheduled  2. Bowel Regimen: Colace 100 mg po bid 3. Weakness: PT evaluation and treatment  5. Psychosocial:  Emotional support offered to patient and her daughter, it has been a difficult hospitalization.  They expressed appreciation for opportunity to "tell their story"  6. Spiritual:  To assist with HPOA    Brief HPI:  59 year old female has a history of severe morbid obesity s/p her gastric bypass. Had breast cancer treated s/p bilateral mastectomy and chemotherapy in [place] and had a liver recurrence which was solitary treated with ablation in 2013. She was admitted yesterday in [place] for an ablation. Post ablation on 07/11/2014 she had significant nausea and had severe aching pain in her right upper quadrant but also had substernal chest discomfort intermittent upper chest is persisted since last night. Vomited 4-5 times, severe diaphoresis, and has bilateral pleural effusions and interstitial edema consistent with heart failure. The troponin was elevated at 16.  She had a normal echocardiogram in 2011 and apparently today she had a repeat Echo she has an very depressed global EF (maybe 15-20%). S/P cardia cath, new systolic HF   ROS:  Right arm lymphedema, poorly controlled lower back pain, weakness   PMH:  Past Medical History  Diagnosis Date  . Depression   . Hypertension   . Migraine   . Hypothyroidism   . Incisional hernia   . Spinal stenosis   . Scoliosis   . Sciatica   . Intestinal obstruction   . GERD (gastroesophageal reflux disease)   . Septic arthritis 01/28/10    and spinal stenosis , moderate scoliosis   . Osteoarthritis of multiple joints   . Anemia   . Pernicious anemia 05/30/2011    Iron transfusion and VB12 on 06/06/11  . Sleep apnea     hx of off machine x 4 years   . Diabetes mellitus     type II, controlled by diet 05/25/11   . Renal insufficiency     hx renal failure 2011  . Fibromyalgia   . Fracture of multiple toes     right toes x 4 toes- excluding small toe     PSH: Past Surgical History  Procedure Laterality Date  . Tonsillectomy  1969  . Multiple extractions with alveoloplasty  06/08/2011    Procedure: MULTIPLE EXTRACION WITH ALVEOLOPLASTY;  Surgeon: Lenn Cal, DDS;  Location: [facility];  Service: Oral Surgery;  Laterality: N/A;  Extraction of tooth #'s 3,4,7 with alveoloplasty and Gross Debridement of Remaining Teeth  . Gastric bypass  2006  . Intestinal blockage  2011    surgery for this   . Breast surgery  2008     bilateral mastectomy  . Breast reconstruction  2008, 2009  . Cholecystectomy  06/29/85  . Radio frequency ablation to liver  sept 27, 2013  . Thyroid lobectomy  02/16/2012    Procedure: THYROID LOBECTOMY;  Surgeon: Earnstine Regal, MD;  Location: [facility];  Service: General;  Laterality: Right;  . Left heart catheterization with coronary angiogram N/A 07/14/2014    Procedure: LEFT HEART CATHETERIZATION WITH CORONARY ANGIOGRAM;  Surgeon: Leonie Man, MD;  Location: [facility] CATH LAB;  Service: Cardiovascular;  Laterality: N/A;   I have reviewed the FH and SH and  If appropriate update it with new information. Allergies  Allergen Reactions  . Influenza Vac Split  [Flu Virus Vaccine] Other (See Comments)    Joint stiffness and renal failure  . Ritalin [Methylphenidate Hcl]     "Heart attack"  . Zostavax [Zoster Vaccine Live ([employer])] Other (See Comments)    Joint stiffness,  renal failure  . Adhesive [Tape] Itching and Rash   Scheduled Meds: . antiseptic oral rinse  7 mL Mouth Rinse BID  . docusate sodium  100 mg Oral BID  . enoxaparin (LOVENOX) injection  40 mg Subcutaneous Q24H  . feeding supplement (GLUCERNA SHAKE)  237 mL Oral BID BM  . FLUoxetine  40 mg Oral QAC breakfast  . insulin aspart  0-9 Units Subcutaneous TID WC  . levothyroxine  300 mcg Oral QAC breakfast  . methocarbamol  750 mg Oral QID  . OxyCODONE  10 mg Oral Q12H  . pantoprazole  40 mg Oral Daily  . pregabalin  200 mg Oral BID  . sodium chloride  10-40 mL Intracatheter Q12H   Continuous Infusions:  PRN Meds:.sodium chloride, albuterol, alum & mag hydroxide-simeth, nitroGLYCERIN, ondansetron (ZOFRAN) IV, oxyCODONE, promethazine, senna-docusate, sodium chloride, tiZANidine    BP 99/45 mmHg  Pulse 85  Temp(Src) 98.2 F (36.8 C) (Oral)  Resp 18  Ht 5\' 6"  (1.676 m)  Wt 107.9 kg (237 lb 14 oz)  BMI 38.41 kg/m2  SpO2 97%  LMP 08/30/2006   PPS:30 % at best   Intake/Output Summary (Last 24 hours) at 07/15/14 1146 Last data filed at 07/15/14 0437  Gross per 24 hour  Intake  234.6 ml  Output    650 ml  Net -415.4 ml    Physical Exam:  General: Chronically ill appearing, alert and oriented , NAD HEENT:  Moist buccal membranes, no exudate Chest:  CTA CVS: RRR Abdomen: soft NT +BS  Labs: CBC    Component Value Date/Time   WBC 7.4 07/14/2014 0930   WBC 3.6* 06/29/2014 0824   RBC 3.56* 07/14/2014 0930   RBC 3.21* 06/29/2014 0824   RBC 3.23* 06/29/2014 0824   HGB 11.5* 07/14/2014 0930   HGB 10.4* 06/29/2014 0824   HCT 34.3* 07/14/2014 0930   HCT 31.6* 06/29/2014 0824   PLT 177 07/14/2014 0930   PLT 145 06/29/2014 0824   MCV 96.3 07/14/2014 0930   MCV  98 06/29/2014 0824   MCH 32.3 07/14/2014 0930   MCH 32.4 06/29/2014 0824   MCHC 33.5 07/14/2014 0930   MCHC 32.9 06/29/2014 0824   RDW 13.9 07/14/2014 0930   RDW 13.2 06/29/2014 0824   LYMPHSABS 0.6* 07/12/2014 0705   LYMPHSABS 1.8 06/29/2014 0824   MONOABS 0.8 07/12/2014 0705   EOSABS 0.0 07/12/2014 0705   EOSABS 0.1 06/29/2014 0824   BASOSABS 0.0 07/12/2014 0705   BASOSABS 0.0 06/29/2014 0824    BMET    Component Value Date/Time   NA 140 07/14/2014 0813   NA 144 06/29/2014 0824   K 3.0* 07/14/2014 0813   K 3.6 06/29/2014 0824   CL 101 07/14/2014 0813   CL 101 06/29/2014 0824   CO2 32 07/14/2014 0455   CO2 28 06/29/2014 0824   GLUCOSE 124* 07/14/2014 0813   GLUCOSE 80 06/29/2014 0824   BUN 25* 07/14/2014 0813   BUN 30* 06/29/2014 0824   CREATININE 1.06 07/14/2014 0930   CREATININE 1.1 06/29/2014 0824   CALCIUM 7.7* 07/14/2014 0455   CALCIUM 9.5 06/29/2014 0824   GFRNONAA 56* 07/14/2014 0930   GFRNONAA 64* 03/07/2011 0000   GFRAA 65* 07/14/2014 0930   GFRAA 74* 03/07/2011 0000    CMP     Component Value Date/Time   NA 140 07/14/2014 0813   NA 144 06/29/2014 0824   K 3.0* 07/14/2014 0813   K 3.6 06/29/2014 0824   CL 101 07/14/2014 0813  CL 101 06/29/2014 0824   CO2 32 07/14/2014 0455   CO2 28 06/29/2014 0824   GLUCOSE 124* 07/14/2014 0813   GLUCOSE 80 06/29/2014 0824   BUN 25* 07/14/2014 0813   BUN 30* 06/29/2014 0824   CREATININE 1.06 07/14/2014 0930   CREATININE 1.1 06/29/2014 0824   CALCIUM 7.7* 07/14/2014 0455   CALCIUM 9.5 06/29/2014 0824   PROT 5.7* 07/13/2014 0345   PROT 7.2 06/29/2014 0824   ALBUMIN 2.6* 07/13/2014 0345   AST 70* 07/13/2014 0345   AST 27 06/29/2014 0824   ALT 39* 07/13/2014 0345   ALT 31 06/29/2014 0824   ALKPHOS 96 07/13/2014 0345   ALKPHOS 101* 06/29/2014 0824   BILITOT 2.5* 07/13/2014 0345   BILITOT 0.70 06/29/2014 0824   GFRNONAA 56* 07/14/2014 0930   GFRNONAA 64* 03/07/2011 0000   GFRAA 65* 07/14/2014 0930   GFRAA  74* 03/07/2011 0000     Time In Time Out Total Time Spent with Patient Total Overall Time  1300 1630 80 min 90 min    Greater than 50%  of this time was spent counseling and coordinating care related to the above assessment and plan.   Wadie Lessen NP  Palliative Medicine Team Team Phone # [phone] Pager [phone]  Discussed with Dr Maryland Pink

## 2014-07-15 NOTE — Evaluation (Signed)
Physical Therapy Evaluation Patient Details Name: Kerri Vasquez MRN: [id number] DOB: 11/21/1955 Today's Date: 07/15/2014   History of Present Illness  59 y.o. female Hx of breast cancer with mets to liver. s/p Liver lesion ablation 3/11, NSTEMI post-op, Heart cath 3/15.  Clinical Impression  Patient is s/p above surgery with post-op complications resulting in functional limitations due to the deficits listed below (see PT Problem List). Requires min assist for transfers and bed mobility. Ambulates up to 35 feet today but further distance limited by increase in HR to 135 bpm. Tolerated exercises well and motivated to return home with daughter. Patient will benefit from skilled PT to increase their independence and safety with mobility to allow discharge to the venue listed below.       Follow Up Recommendations Home health PT;Supervision for mobility/OOB    Equipment Recommendations  Rolling walker with 5" wheels;3in1 (PT)    Recommendations for Other Services OT consult     Precautions / Restrictions Precautions Precautions: Fall Restrictions Weight Bearing Restrictions: No      Mobility  Bed Mobility Overal bed mobility: Needs Assistance Bed Mobility: Supine to Sit     Supine to sit: Min assist;HOB elevated     General bed mobility comments: Min assist for truncal support. Cues for technique. Uses rail  Transfers Overall transfer level: Needs assistance Equipment used: Rolling walker (2 wheeled) Transfers: Sit to/from Stand Sit to Stand: Min assist;+2 physical assistance         General transfer comment: Min assist +2 for boost to stand from lowest bed setting. Cues for hand placement. Some lightheadedness initially which resolved in less than 1 minute  Ambulation/Gait Ambulation/Gait assistance: +2 safety/equipment;Min guard Ambulation Distance (Feet): 35 Feet Assistive device: Rolling walker (2 wheeled) Gait Pattern/deviations: Step-through  pattern;Decreased stride length;Trunk flexed Gait velocity: decreased   General Gait Details: Educated on safe DME use with a rolling walker. Min guard for safety with second person to follow with chair. Had patient sit after HR elevated to 135. 2/4 dyspnea, SpO2 99% on room air. HR returns to 80s after sitting.  Stairs            Wheelchair Mobility    Modified Rankin (Stroke Patients Only)       Balance Overall balance assessment: Needs assistance Sitting-balance support: No upper extremity supported;Feet supported Sitting balance-Leahy Scale: Fair     Standing balance support: Bilateral upper extremity supported Standing balance-Leahy Scale: Poor                               Pertinent Vitals/Pain Pain Assessment: 0-10 Pain Score: 9  Pain Location: lower back and both legs Pain Descriptors / Indicators: Aching;Dull Pain Intervention(s): Limited activity within patient's tolerance;Repositioned;Monitored during session  HR at rest in 80s - Ambulating 135 bpm     Home Living Family/patient expects to be discharged to:: Private residence Living Arrangements: Children Available Help at Discharge: Family;Available 24 hours/day Type of Home: House Home Access: Stairs to enter Entrance Stairs-Rails: Psychiatric nurse of Steps: 4 Home Layout: One level Home Equipment: Cane - single point;Grab bars - tub/shower      Prior Function Level of Independence: Independent with assistive device(s)         Comments: cane for ambulating. States she could bath/dress herself     Hand Dominance   Dominant Hand: Right    Extremity/Trunk Assessment   Upper Extremity Assessment: Defer to OT evaluation  Lower Extremity Assessment: Generalized weakness (Wears Rt cam boot due to toe Fx about 6 weeks ago)         Communication   Communication: No difficulties  Cognition Arousal/Alertness: Awake/alert Behavior During Therapy:  WFL for tasks assessed/performed Overall Cognitive Status: Within Functional Limits for tasks assessed                      General Comments General comments (skin integrity, edema, etc.): HR elevated to 135 while ambulating    Exercises General Exercises - Lower Extremity Ankle Circles/Pumps: AROM;Both;10 reps;Seated Quad Sets: Strengthening;Both;10 reps;Seated Gluteal Sets: Strengthening;Both;10 reps;Seated Hip Flexion/Marching: Strengthening;Both;10 reps;Seated      Assessment/Plan    PT Assessment Patient needs continued PT services  PT Diagnosis Difficulty walking;Abnormality of gait;Generalized weakness;Acute pain   PT Problem List Decreased strength;Decreased range of motion;Decreased activity tolerance;Decreased balance;Decreased mobility;Decreased knowledge of use of DME;Cardiopulmonary status limiting activity;Obesity;Pain  PT Treatment Interventions DME instruction;Gait training;Stair training;Functional mobility training;Therapeutic activities;Therapeutic exercise;Balance training;Neuromuscular re-education;Patient/family education;Modalities   PT Goals (Current goals can be found in the Care Plan section) Acute Rehab PT Goals Patient Stated Goal: Go home PT Goal Formulation: With patient Time For Goal Achievement: 07/29/14 Potential to Achieve Goals: Fair    Frequency Min 3X/week   Barriers to discharge        Co-evaluation               End of Session Equipment Utilized During Treatment: Gait belt Activity Tolerance: Treatment limited secondary to medical complications (Comment) (HR elevated) Patient left: in chair;with call bell/phone within reach;with family/visitor present Nurse Communication: Mobility status (HR 135 ambulating)         Time: 8416-6063 PT Time Calculation (min) (ACUTE ONLY): 24 min   Charges:   PT Evaluation $Initial PT Evaluation Tier I: 1 Procedure PT Treatments $Therapeutic Activity: 8-22 mins   PT G CodesEllouise Newer 07/15/2014, 4:58 PM  Camille Bal [place], [place]

## 2014-07-15 NOTE — Progress Notes (Signed)
Referring Physician(s): Dr Marin Olp  Subjective:  Breast ca - mets to liver Liver lesion ablation 3/11 Suffered NSTEMI post procedure Heart cath 3/15-- good result Pt feeling better daily Less abd pain; breathing easier Followed by CCM  Allergies: Influenza vac split; Ritalin; Zostavax; and Adhesive  Medications: Prior to Admission medications   Medication Sig Start Date End Date Taking? Authorizing Provider  acetaminophen (TYLENOL) 500 MG tablet Take 1,000 mg by mouth every 4 (four) hours as needed for moderate pain.   Yes Historical Provider, MD  FLUoxetine (PROZAC) 40 MG capsule Take 40 mg by mouth daily before breakfast.    Yes Historical Provider, MD  fulvestrant (FASLODEX) 250 MG/5ML injection Inject 250 mg into the muscle every 30 (thirty) days. One injection each buttock over 1-2 minutes. Warm prior to use.   Yes Historical Provider, MD  furosemide (LASIX) 20 MG tablet TAKE 1 TABLET BY MOUTH EVERY DAY Patient taking differently: Take one tablet by mouth every mornig 02/04/14  Yes Volanda Napoleon, MD  levothyroxine (SYNTHROID, LEVOTHROID) 300 MCG tablet Take 300 mcg by mouth daily before breakfast.   Yes Historical Provider, MD  losartan-hydrochlorothiazide (HYZAAR) 100-25 MG per tablet Take 1 tablet by mouth every morning.    Yes Historical Provider, MD  methocarbamol (ROBAXIN) 750 MG tablet Take 750 mg by mouth 4 (four) times daily.   Yes Historical Provider, MD  methylphenidate (RITALIN) 5 MG tablet Take 1 tablet (5 mg total) by mouth 2 (two) times daily. Patient taking differently: Take 5 mg by mouth every morning.  05/14/14  Yes Volanda Napoleon, MD  oxyCODONE (OXY IR/ROXICODONE) 5 MG immediate release tablet Take 5 mg by mouth every 4 (four) hours as needed (For pain.).   Yes Historical Provider, MD  OxyCODONE (OXYCONTIN) 20 mg T12A 12 hr tablet Take 20 mg by mouth every 12 (twelve) hours.   Yes Historical Provider, MD  oxyCODONE-acetaminophen (PERCOCET) 10-325 MG per  tablet Take 1 tablet by mouth every 6 (six) hours as needed for pain. Patient taking differently: Take 1 tablet by mouth every 4 (four) hours as needed for pain.  01/05/14  Yes Virgel Manifold, MD  palbociclib [facility]) 100 MG capsule Take 1 pill a day for 21 days on and 7 days off.  Take with food 05/14/14  Yes Volanda Napoleon, MD  pregabalin (LYRICA) 200 MG capsule Take 200 mg by mouth 3 (three) times daily.   Yes Historical Provider, MD  promethazine (PHENERGAN) 25 MG tablet Take 1 tablet (25 mg total) by mouth every 8 (eight) hours as needed for nausea or vomiting. Patient taking differently: Take 25 mg by mouth every 6 (six) hours as needed for nausea or vomiting.  11/25/13  Yes Volanda Napoleon, MD  temazepam (RESTORIL) 15 MG capsule TAKE 1 CAPSULE BY MOUTH EVERY DAY AT BEDTIME AS NEEDED FOR SLEEP Patient taking differently: TAKE 1 CAPSULE BY MOUTH EVERY DAY AT BEDTIME AS NEEDED FOR SLEEP////07/07/14 per daughter may be repeated x 1 06/30/14  Yes Volanda Napoleon, MD  tiZANidine (ZANAFLEX) 4 MG tablet Take 4 mg by mouth every 6 (six) hours as needed for muscle spasms.   Yes Historical Provider, MD  potassium chloride (K-DUR) 10 MEQ tablet Take 4 tablets (40 mEq total) by mouth daily. Patient not taking: Reported on 06/29/2014 05/27/14   Volanda Napoleon, MD     Vital Signs: BP 99/45 mmHg  Pulse 85  Temp(Src) 98.2 F (36.8 C) (Oral)  Resp 18  Ht  5\' 6"  (1.676 m)  Wt 107.9 kg (237 lb 14 oz)  BMI 38.41 kg/m2  SpO2 97%  LMP 08/30/2006  Physical Exam  Pulmonary/Chest: Effort normal. She has wheezes.  Abdominal: Soft. She exhibits no distension. There is no tenderness.  Still with mild discomfort at RLQ area  Nursing note and vitals reviewed.   Imaging: Ct Abdomen Wo Contrast  07/11/2014   CLINICAL DATA:  Epigastric pain. Status post microwave ablation 1 day ago.  EXAM: CT ABDOMEN WITHOUT CONTRAST  TECHNIQUE: Multidetector CT imaging of the abdomen was performed following the standard protocol  without IV contrast.  COMPARISON:  None.  FINDINGS: Lower chest: There are bilateral moderate pleural effusions. Interstitial thickening is identified within the lungs bilaterally consistent with edema. There are airspace opacities within both lower lobes.  Hepatobiliary: Along the falciform ligament there is a 2.3 cm focal area of low attenuation within the medial segment of left hepatic lobe compatible with ablation defect. Small adjacent focus of hyperattenuation is identified on image number 19/series 2 which measures 1.4 cm. Previous cholecystectomy. The common bile duct is increased in caliber measuring up to 1.2 cm.  Pancreas: Negative  Spleen: Negative  Adrenals/Urinary Tract: The adrenal glands are normal. Again noted are bilateral lobulated kidneys. No obstructive uropathy or other acute finding.  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  Vascular/Lymphatic: Normal appearance of the abdominal aorta. No enlarged retroperitoneal or mesenteric adenopathy.  Other: Ventral abdominal wall hernia is identified which contains small and large bowel loops. The small bowel loops are mildly increased in caliber measuring up to 2.6 cm. There is perihepatic ascites which is new from previous exam.  Musculoskeletal: There are no aggressive lytic or sclerotic bone lesions. Solid fusion of the L2-3 vertebra noted. There is marked degenerative disc disease at the L3-4 level.  IMPRESSION: 1. No large acute hematoma identified to explain the patient's symptoms. There is a small amount of perihepatic fluid which appears low in attenuation. No complications at the ablation site. 2. Bilateral pleural effusions and interstitial edema consistent with CHF. 3. Bilateral lower lobe airspace opacities which may represent areas of alveolar edema secondary to CHF. Pneumonia not excluded. 4. Ventral abdominal wall hernia which contained loops of small and  large bowel. Do small bowel loops within the hernia arm mildly increased in caliber measuring up to 2.6 cm.   Electronically Signed   By: Kerby Moors M.D.   On: 07/11/2014 11:09   Dg Chest Port 1 View  07/13/2014   CLINICAL DATA:  Central line placement  EXAM: PORTABLE CHEST - 1 VIEW  COMPARISON:  07/11/2014  FINDINGS: There is a new right jugular central line with tip in the expected location of the low SVC. There is no pneumothorax. Opacities persist throughout both lungs, somewhat more confluent in the central lung regions compared to 07/11/2014.  IMPRESSION: New right-sided central line. No pneumothorax. Worsening airspace opacities.   Electronically Signed   By: Andreas Newport M.D.   On: 07/13/2014 04:05   Dg Chest Port 1 View  07/11/2014   CLINICAL DATA:  59 year old female with chest pain, shortness of breath and dyspnea. History breast cancer.  EXAM: PORTABLE CHEST - 1 VIEW  COMPARISON:  Chest x-ray 02/12/2012.  FINDINGS: Mild diffuse interstitial prominence. Patchy ill-defined airspace disease throughout the left lung. Mild diffuse peribronchial cuffing. No pleural effusions. Findings do not appear to reflect pulmonary edema. No definite suspicious appearing pulmonary nodules or masses.  Heart size is borderline enlarged, likely accentuated by the patient's low lung volumes. Upper mediastinal contours are within normal limits.  IMPRESSION: 1. The appearance the chest suggests bronchitis with multilobar bronchopneumonia in the left upper and lower lobes. Repeat standing PA and lateral chest radiographs are recommended within 2-3 weeks following trial of antimicrobial therapy to ensure resolution of these findings.   Electronically Signed   By: Vinnie Langton M.D.   On: 07/11/2014 14:30    Labs:  CBC:  Recent Labs  07/12/14 0705 07/13/14 0345 07/14/14 0455 07/14/14 0813 07/14/14 0930  WBC 12.4* 14.1* 7.9  --  7.4  HGB 12.4 11.2* 10.9* 12.2 11.5*  HCT 36.4 33.5* 32.9* 36.0 34.3*    PLT 237 197 195  --  177    COAGS:  Recent Labs  07/07/14 1005 07/11/14 1842 07/12/14 0705  INR 1.11 1.21 1.20  APTT 36 29  --     BMP:  Recent Labs  07/11/14 0440 07/12/14 0705 07/13/14 0345 07/14/14 0455 07/14/14 0813 07/14/14 0930  NA 140 138 134* 139 140  --   K 4.6 4.0 4.1 2.9* 3.0*  --   CL 107 104 100 102 101  --   CO2 19 23 29  32  --   --   GLUCOSE 219* 132* 145* 114* 124*  --   BUN 20 24* 33* 25* 25*  --   CALCIUM 7.8* 7.7* 7.3* 7.7*  --   --   CREATININE 1.14* 1.30* 1.56* 1.13* 1.00 1.06  GFRNONAA 52* 44* 35* 52*  --  56*  GFRAA 60* 51* 41* 60*  --  65*    LIVER FUNCTION TESTS:  Recent Labs  07/07/14 1005 07/11/14 0440 07/12/14 0705 07/13/14 0345  BILITOT 0.5 1.0 1.9* 2.5*  AST 23 143* 100* 70*  ALT 17 63* 51* 39*  ALKPHOS 89 140* 108 96  PROT 7.1 7.5 6.8 5.7*  ALBUMIN 3.6 3.6 3.0* 2.6*    Assessment and Plan:  Liver lesion microwave ablation 3/11 in IR NSTEMI post procedure CCM following pt Will see in clinic 2-4 weeks post ablation and 3-6 mo for CT/PET Pt will hear from our schedulers  Signed: Khalee Mazo A 07/15/2014, 9:37 AM   I spent a total of 15 Minutes in face to face in clinical consultation/evaluation, greater than 50% of which was counseling/coordinating care for liver lesion ablation

## 2014-07-15 NOTE — Progress Notes (Signed)
Medicare Important Message given? YES  (If response is "NO", the following Medicare IM given date fields will be blank)  Date Medicare IM given: 07/15/14 Medicare IM given by:  Dahlia Client [employer]

## 2014-07-15 NOTE — Progress Notes (Signed)
PROGRESS NOTE  Dalis Beers [facility] RWE:[id number] DOB: July 02, 1955 DOA: 07/10/2014 PCP: No PCP Per Patient  HPI/Recap of past 24 hours: Patient is a 59 year old female with past mental history of gastric bypass treating severe morbid obesity as well as breast cancer with recurrent metastasis not to the liver, originally treated with ablation and now patient was admitted to [facility] long hospital on 3/11 for ablation again. Post procedure patient awoke with significant nausea and right upper quadrant abdominal pain as well as substernal chest discomfort. She had several episodes of vomiting and found to have pleural effusions consistent with heart failure as well as an elevated troponin level of 16 and acute renal failure. Patient transferred to [facility] ICU under the critical care service, cardiology consulted and an echocardiogram done 3/11 noted significantly depressed ejection fraction of 15-20 percent. She initially required pressor support. Patient felt to have circulatory shock and a non-STEMI.  She remained in ICU. She became unresponsive in the context of her chronic narcotic and other sedating medications. These medications were discontinued. 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  Patient felt to be medically stable and transferred to the floor on 3/15 with hospitalist assuming care on 3/16. Patient also seen by palliative care to after extensive discussion made adjustments after restarting her chronic pain medications.  Assessment/Plan: Active Problems:   Breast cancer metastasized to liver: Status post ablation by interventional radiology. 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 Resolved : Acute circulatory shock causing acute systolic heart failure, acute renal failure, hypotension felt to be secondary to Tako-tsubo CM: Appreciate cardiology help. Shock it self resolved. Acute renal failure resolved. Patient has been aggressively diuresed and is down almost 4 L. Plan is to repeat bedside echo to note improvement   Weakness generalized: Physical therapy to see, likely will need home health    Chronic pain syndrome: Appreciate palliative care, home medicines restarted with dose adjusted.  ? Hyperthyroidism: Markedly suppressed TSH. Free T3 and free T4 ordered   Code Status: Full code  Family Communication: Daughter at the bedside  Disposition Plan: Home, possibly as early as tomorrow   Consultants:  Palliative care  Cardiology  Critical care  Interventional radiology  Procedures:  Echocardiogram done 3/11: Ejection fraction 15-20%  Status post ablation of liver lesion done 3/11   Antibiotics:  Zosyn 3/12 only    Objective: BP 112/46 mmHg  Pulse 83  Temp(Src) 98.8 F (37.1 C) (Oral)  Resp 18  Ht 5\' 6"  (1.676 m)  Wt 107.9 kg (237 lb 14 oz)  BMI 38.41 kg/m2  SpO2 94%  LMP 08/30/2006  Intake/Output Summary (Last 24 hours) at 07/15/14 1716 Last data filed at 07/15/14 0437  Gross per 24 hour  Intake      0 ml  Output    500 ml  Net   -500 ml   Filed Weights   07/13/14 0338 07/14/14 0500 07/15/14 0436  Weight: 107.5 kg (236 lb 15.9 oz) 107.3 kg (236 lb 8.9 oz) 107.9 kg (237 lb 14 oz)    Exam:   General:  Alert and oriented 3, no acute distress  Cardiovascular: Regular rate and rhythm, Y1-P5, soft 2/6 systolic  ejection murmur  Respiratory: Poor inspiratory effort  Abdomen: Soft, obese, nontender, positive bowel sounds  Musculoskeletal: No clubbing or cyanosis, 2+ pitting edema from the knees down   Data Reviewed: Basic Metabolic Panel:  Recent  Labs Lab 07/11/14 0440 07/12/14 0705 07/13/14 0345 07/14/14 0455 07/14/14 0813 07/14/14 0930 07/15/14 0615  NA 140 138 134* 139 140  --  138  K 4.6 4.0 4.1 2.9* 3.0*  --  3.4*  CL 107 104 100 102 101  --  104  CO2 19 23 29  32  --   --  28  GLUCOSE 219* 132* 145* 114* 124*  --  129*  BUN 20 24* 33* 25* 25*  --  21  CREATININE 1.14* 1.30* 1.56* 1.13* 1.00 1.06 1.09  CALCIUM 7.8* 7.7* 7.3* 7.7*  --   --  7.7*  MG  --   --   --  2.3  --   --  2.4  PHOS  --   --   --  1.5*  --   --  1.5*   Liver Function Tests:  Recent Labs Lab 07/11/14 0440 07/12/14 0705 07/13/14 0345  AST 143* 100* 70*  ALT 63* 51* 39*  ALKPHOS 140* 108 96  BILITOT 1.0 1.9* 2.5*  PROT 7.5 6.8 5.7*  ALBUMIN 3.6 3.0* 2.6*   No results for input(s): LIPASE, AMYLASE in the last 168 hours. No results for input(s): AMMONIA in the last 168 hours. CBC:  Recent Labs Lab 07/11/14 1340 07/12/14 0705 07/13/14 0345 07/14/14 0455 07/14/14 0813 07/14/14 0930  WBC 12.4* 12.4* 14.1* 7.9  --  7.4  NEUTROABS  --  11.1*  --   --   --   --   HGB 12.1 12.4 11.2* 10.9* 12.2 11.5*  HCT 35.4* 36.4 33.5* 32.9* 36.0 34.3*  MCV 95.2 95.0 96.5 96.2  --  96.3  PLT 244 237 197 195  --  177   Cardiac Enzymes:    Recent Labs Lab 07/11/14 1340 07/11/14 1842 07/12/14 0705  CKTOTAL 926*  --  687*  CKMB 79.6*  --  24.4*  TROPONINI 16.91* 18.76* 9.43*   BNP (last 3 results)  Recent Labs  07/11/14 1340  BNP 3001.6*    ProBNP (last 3 results) No results for input(s): PROBNP in the last 8760 hours.  CBG:  Recent Labs Lab 07/14/14 1307 07/14/14 1647 07/14/14 2033 07/15/14 0626 07/15/14 1144  GLUCAP 150* 106* 104* 124* 87    Recent Results (from the past 240 hour(s))  Surgical pcr screen     Status: Abnormal   Collection Time: 07/07/14 12:57 PM  Result Value Ref Range Status   MRSA, PCR POSITIVE (A) NEGATIVE Final   Staphylococcus aureus POSITIVE (A) NEGATIVE Final    Comment:        The Xpert SA Assay  (FDA approved for NASAL specimens in patients over 17 years of age), is one component of a comprehensive surveillance program.  Test performance has been validated by [facility] for patients greater than or equal to 71 year old. It is not intended to diagnose infection nor to guide or monitor treatment. Performed at [facility]   Culture, blood (routine x 2)     Status: None (Preliminary result)   Collection Time: 07/11/14  1:35 PM  Result Value Ref Range Status   Specimen Description BLOOD RIGHT HAND  Final   Special Requests BOTTLES DRAWN AEROBIC ONLY 10CC  Final   Culture   Final  BLOOD CULTURE RECEIVED NO GROWTH TO DATE CULTURE WILL BE HELD FOR 5 DAYS BEFORE ISSUING A FINAL NEGATIVE REPORT Performed at [facility]    Report Status PENDING  Incomplete  Culture, blood (routine x 2)     Status: None (Preliminary result)   Collection Time: 07/11/14  1:40 PM  Result Value Ref Range Status   Specimen Description BLOOD RIGHT HAND  Final   Special Requests BOTTLES DRAWN AEROBIC ONLY 10CC  Final   Culture   Final           BLOOD CULTURE RECEIVED NO GROWTH TO DATE CULTURE WILL BE HELD FOR 5 DAYS BEFORE ISSUING A FINAL NEGATIVE REPORT Performed at [facility]    Report Status PENDING  Incomplete  MRSA PCR Screening     Status: None   Collection Time: 07/11/14  5:55 PM  Result Value Ref Range Status   MRSA by PCR NEGATIVE NEGATIVE Final    Comment:        The GeneXpert MRSA Assay (FDA approved for NASAL specimens only), is one component of a comprehensive MRSA colonization surveillance program. It is not intended to diagnose MRSA infection nor to guide or monitor treatment for MRSA infections. Performed at [facility]   Culture, Urine     Status: None   Collection Time: 07/11/14  6:54 PM  Result Value Ref Range Status   Specimen Description URINE, CLEAN CATCH  Final   Special Requests NONE  Final   Colony Count NO  GROWTH Performed at [facility]   Final   Culture NO GROWTH Performed at [facility]   Final   Report Status 07/13/2014 FINAL  Final     Studies: No results found.  Scheduled Meds: . antiseptic oral rinse  7 mL Mouth Rinse BID  . carvedilol  3.125 mg Oral BID WC  . docusate sodium  100 mg Oral BID  . enoxaparin (LOVENOX) injection  40 mg Subcutaneous Q24H  . feeding supplement (ENSURE COMPLETE)  237 mL Oral Q1500  . feeding supplement (GLUCERNA SHAKE)  237 mL Oral BID BM  . FLUoxetine  40 mg Oral QAC breakfast  . insulin aspart  0-9 Units Subcutaneous TID WC  . levothyroxine  300 mcg Oral QAC breakfast  . methocarbamol  750 mg Oral QID  . OxyCODONE  20 mg Oral Q12H  . pantoprazole  40 mg Oral Daily  . potassium chloride  40 mEq Oral Once  . pregabalin  200 mg Oral BID  . sodium chloride  10-40 mL Intracatheter Q12H  . spironolactone  12.5 mg Oral Daily    Continuous Infusions:    Time spent: 15 minutes  [facility] Pager [phone]. If 7PM-7AM, please contact night-coverage at [email or web address], password [facility] 07/15/2014, 5:16 PM  LOS: 4 days

## 2014-07-15 NOTE — Progress Notes (Signed)
Patient Name: Kerri Vasquez Date of Encounter: 07/15/2014    SUBJECTIVE:  Cath on 3/15 with no significant CAD. LVEDP 10. Weight stable Denies dyspnea. SBP 90-100s.   Walking halls without difficulty.   Intake/Output Summary (Last 24 hours) at 07/15/14 1711 Last data filed at 07/15/14 0437  Gross per 24 hour  Intake      0 ml  Output    500 ml  Net   -500 ml   Scheduled Meds: . antiseptic oral rinse  7 mL Mouth Rinse BID  . docusate sodium  100 mg Oral BID  . enoxaparin (LOVENOX) injection  40 mg Subcutaneous Q24H  . feeding supplement (ENSURE COMPLETE)  237 mL Oral Q1500  . feeding supplement (GLUCERNA SHAKE)  237 mL Oral BID BM  . FLUoxetine  40 mg Oral QAC breakfast  . insulin aspart  0-9 Units Subcutaneous TID WC  . levothyroxine  300 mcg Oral QAC breakfast  . methocarbamol  750 mg Oral QID  . OxyCODONE  20 mg Oral Q12H  . pantoprazole  40 mg Oral Daily  . pregabalin  200 mg Oral BID  . sodium chloride  10-40 mL Intracatheter Q12H   Continuous Infusions:  PRN Meds:.sodium chloride, albuterol, alum & mag hydroxide-simeth, nitroGLYCERIN, ondansetron (ZOFRAN) IV, oxyCODONE, promethazine, senna-docusate, sodium chloride, tiZANidine   Physical Exam: Filed Vitals:   07/14/14 1337 07/14/14 2034 07/15/14 0436 07/15/14 1459  BP: 100/60 98/45 99/45  112/46  Pulse: 103 99 85 83  Temp: 97.5 F (36.4 C) 98 F (36.7 C) 98.2 F (36.8 C) 98.8 F (37.1 C)  TempSrc: Oral Oral Oral Oral  Resp: 17 18 18 18   Height:      Weight:   107.9 kg (237 lb 14 oz)   SpO2: 95% 96% 97% 94%   General:  Lying in bed. No resp difficulty HEENT: normal Neck: supple. RIJ TLC. no JVD. Carotids 2+ bilat; no bruits. No lymphadenopathy or thryomegaly appreciated. Cor: PMI nondisplaced. Regular rate & rhythm. No rubs, gallops or murmurs. Lungs: clear Abdomen: obese soft, nontender, nondistended. No hepatosplenomegaly. No bruits or masses. Good bowel sounds. Extremities: no cyanosis,  clubbing, rash, edema Neuro: alert & orientedx3, cranial nerves grossly intact. moves all 4 extremities w/o difficulty. Affect pleasant  TELE: NSR/sinus tach 90-100 (reviewed personally)  LABS: Basic Metabolic Panel:  Recent Labs  07/14/14 0455 07/14/14 0813 07/14/14 0930 07/15/14 0615  NA 139 140  --  138  K 2.9* 3.0*  --  3.4*  CL 102 101  --  104  CO2 32  --   --  28  GLUCOSE 114* 124*  --  129*  BUN 25* 25*  --  21  CREATININE 1.13* 1.00 1.06 1.09  CALCIUM 7.7*  --   --  7.7*  MG 2.3  --   --  2.4  PHOS 1.5*  --   --  1.5*   CBC:  Recent Labs  07/14/14 0455 07/14/14 0813 07/14/14 0930  WBC 7.9  --  7.4  HGB 10.9* 12.2 11.5*  HCT 32.9* 36.0 34.3*  MCV 96.2  --  96.3  PLT 195  --  177   Cardiac Enzymes: No results for input(s): CKTOTAL, CKMB, CKMBINDEX, TROPONINI in the last 72 hours.   Radiology/Studies:  No new data   ASSESSMENT:  1. Acute systolic HF with good diuresis and O2 sats    --EF 20-25%. 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 Cardiogenic shock resolved 3. Acute kidney injury, resolved 4.  Metastatic breast cancer s/p IR ablation of liver metastasis this admission 5. 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 Hypokalemia  Plan/discussion:  Renal function stable s/p cath. 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 Hopefully EF will recover quickly. Start low-dose carvedilol and spiro. Supp K+.   Possibly home in am. Consider bedside echo prior to d/c.    Mcguire Gasparyan BensimhonMD 07/15/2014, 5:11 PM

## 2014-07-15 NOTE — Plan of Care (Signed)
Problem: Food- and Nutrition-Related Knowledge Deficit (NB-1.1) Goal: Nutrition education Formal process to instruct or train a patient/client in a skill or to impart knowledge to help patients/clients voluntarily manage or modify food choices and eating behavior to maintain or improve health. Outcome: Completed/Met Date Met:  07/15/14 Pt and daughter requested for a nutrition education regarding heart healthy diet.  Daughter at bedside, and cooks meals for pt. Daughter reported that she does not cook with salt, and has been working on portion control.   Discussed the importance of a low-sodium diet, and consuming healthy fats. Provided handout for CHO modified and Heart Healthy diets, which have examples of meal ideas and appropriate portion sizes. Encouraged daughter to keep cooking with oil, and limit processed foods. Teach back method used.  Current diet order is Heart Healthy/CHO MOD. Current PO intake is >25% but is improving. Daughter is bringing food, also.  Body mass index is 39.3 kg/(m^2). Pt meets criteria for Obesity Class II based on current BMI.  Wynona Dove, MS Dietetic Intern Pager: [phone]  ______________  RD was present during time of education. Education given. Questions were appropriately answered. RD agrees with dietetic intern's note.  Kallie Locks, MS, RD, LDN Pager # [phone] After hours/ weekend pager # [phone]

## 2014-07-15 NOTE — Progress Notes (Signed)
NUTRITION FOLLOW UP  DOCUMENTATION CODES Per approved criteria  -Obesity Unspecified   INTERVENTION: Provide Glucerna Shake po BID, each supplement provides 220 kcal and 10 grams of protein.  Provide Ensure Enlive po once daily, each supplement provides 350 kcal and 20 grams of protein.  Encourage adequate PO intake.  NUTRITION DIAGNOSIS: Inadequate oral intake related to decreased appetite as evidenced by meal completion of 10%; ongoing  Goal: Pt to meet >/= 90% of their estimated nutrition needs; not met   Monitor:  PO intake, weight trends, labs, I/O's  59 y.o. female  Admitting Dx: <principal problem not specified>  ASSESSMENT: Pt with a history of severe morbid obesity s/p her gastric bypass. Had breast cancer treated s/p bilateral mastectomy and chemotherapy and had a liver recurrence 2013. She was admitted 3/11 to [facility] for an ablation. Presented [facility] 3/12 with significant nausea and had severe aching pain in her right upper quadrant but also had substernal chest discomfort intermittent upper chest.  Procedure (3/15): Left Heart Catheterization with Native Coronary Angiographyvia Left Radial Artery  Pt reports appetite is slowing improving. Meal completion is 20%. Pt has been drinking his Glucerna Shake, however reports she does not like the flavor. Pt requests an alternative supplement. RD to order Ensure. Pt was encouraged to eat her food at meals and to drink her supplements. Pt and daughter were additionally educated on a heart healthy diet. Additionally discussed about diabetic diet.  Labs: Low potassium, calcium and GFR.  Height: Ht Readings from Last 1 Encounters:  07/11/14 '5\' 6"'  (1.676 m)    Weight: Wt Readings from Last 1 Encounters:  07/15/14 237 lb 14 oz (107.9 kg)    BMI:  Body mass index is 38.41 kg/(m^2). Class II obesity  Re-Estimated Nutritional Needs: Kcal: 2050-2250 Protein: 110-125 grams Fluid: 2 - 2.2 L/day  Skin: +1 LE edema  Diet  Order: Diet heart healthy/carb modified   Intake/Output Summary (Last 24 hours) at 07/15/14 1428 Last data filed at 07/15/14 0437  Gross per 24 hour  Intake      0 ml  Output    500 ml  Net   -500 ml    Last BM: 3/15  Labs:   Recent Labs Lab 07/13/14 0345 07/14/14 0455 07/14/14 0813 07/14/14 0930 07/15/14 0615  NA 134* 139 140  --  138  K 4.1 2.9* 3.0*  --  3.4*  CL 100 102 101  --  104  CO2 29 32  --   --  28  BUN 33* 25* 25*  --  21  CREATININE 1.56* 1.13* 1.00 1.06 1.09  CALCIUM 7.3* 7.7*  --   --  7.7*  MG  --  2.3  --   --  2.4  PHOS  --  1.5*  --   --  1.5*  GLUCOSE 145* 114* 124*  --  129*    CBG (last 3)   Recent Labs  07/14/14 2033 07/15/14 0626 07/15/14 1144  GLUCAP 104* 124* 87    Scheduled Meds: . antiseptic oral rinse  7 mL Mouth Rinse BID  . docusate sodium  100 mg Oral BID  . enoxaparin (LOVENOX) injection  40 mg Subcutaneous Q24H  . feeding supplement (GLUCERNA SHAKE)  237 mL Oral BID BM  . FLUoxetine  40 mg Oral QAC breakfast  . insulin aspart  0-9 Units Subcutaneous TID WC  . levothyroxine  300 mcg Oral QAC breakfast  . methocarbamol  750 mg Oral QID  . OxyCODONE  20 mg Oral Q12H  . pantoprazole  40 mg Oral Daily  . pregabalin  200 mg Oral BID  . sodium chloride  10-40 mL Intracatheter Q12H    Continuous Infusions:    Past Medical History  Diagnosis Date  . Depression   . Hypertension   . Migraine   . Hypothyroidism   . Incisional hernia   . Spinal stenosis   . Scoliosis   . Sciatica   . Intestinal obstruction   . GERD (gastroesophageal reflux disease)   . Septic arthritis 01/28/10    and spinal stenosis , moderate scoliosis   . Osteoarthritis of multiple joints   . Anemia   . Pernicious anemia 05/30/2011    Iron transfusion and VB12 on 06/06/11  . Sleep apnea     hx of off machine x 4 years   . Diabetes mellitus     type II, controlled by diet 05/25/11   . Renal insufficiency     hx renal failure 2011  . Fibromyalgia    . Fracture of multiple toes     right toes x 4 toes- excluding small toe    Past Surgical History  Procedure Laterality Date  . Tonsillectomy  1969  . Multiple extractions with alveoloplasty  06/08/2011    Procedure: MULTIPLE EXTRACION WITH ALVEOLOPLASTY;  Surgeon: Lenn Cal, DDS;  Location: [facility] ORS;  Service: Oral Surgery;  Laterality: N/A;  Extraction of tooth #'s 3,4,7 with alveoloplasty and Gross Debridement of Remaining Teeth  . Gastric bypass  2006  . Intestinal blockage  2011    surgery for this   . Breast surgery  2008     bilateral mastectomy  . Breast reconstruction  2008, 2009  . Cholecystectomy  06/29/85  . Radio frequency ablation to liver  sept 27, 2013  . Thyroid lobectomy  02/16/2012    Procedure: THYROID LOBECTOMY;  Surgeon: Earnstine Regal, MD;  Location: [facility] ORS;  Service: General;  Laterality: Right;  . Left heart catheterization with coronary angiogram N/A 07/14/2014    Procedure: LEFT HEART CATHETERIZATION WITH CORONARY ANGIOGRAM;  Surgeon: Leonie Man, MD;  Location: [facility] CATH LAB;  Service: Cardiovascular;  Laterality: N/A;    Kallie Locks, MS, RD, LDN Pager # [phone] After hours/ weekend pager # [phone]

## 2014-07-16 LAB — BASIC METABOLIC PANEL
Anion gap: 9 (ref 5–15)
BUN: 17 mg/dL (ref 6–23)
CO2: 26 mmol/L (ref 19–32)
CREATININE: 0.94 mg/dL (ref 0.50–1.10)
Calcium: 8.5 mg/dL (ref 8.4–10.5)
Chloride: 103 mmol/L (ref 96–112)
GFR, EST AFRICAN AMERICAN: 75 mL/min — AB (ref >90)
GFR, EST NON AFRICAN AMERICAN: 65 mL/min — AB (ref >90)
GLUCOSE: 135 mg/dL — AB (ref 70–99)
POTASSIUM: 4.5 mmol/L (ref 3.5–5.1)
Sodium: 138 mmol/L (ref 135–145)

## 2014-07-16 LAB — GLUCOSE, CAPILLARY
GLUCOSE-CAPILLARY: 125 mg/dL — AB (ref 70–99)
GLUCOSE-CAPILLARY: 98 mg/dL (ref 70–99)
Glucose-Capillary: 133 mg/dL — ABNORMAL HIGH (ref 70–99)
Glucose-Capillary: 108 mg/dL — ABNORMAL HIGH (ref 70–99)

## 2014-07-16 LAB — MAGNESIUM: Magnesium: 2.2 mg/dL (ref 1.5–2.5)

## 2014-07-16 LAB — PHOSPHORUS: PHOSPHORUS: 1.8 mg/dL — AB (ref 2.3–4.6)

## 2014-07-16 LAB — T4, FREE: Free T4: 1.55 ng/dL (ref 0.80–1.80)

## 2014-07-16 MED ORDER — LOSARTAN POTASSIUM 50 MG PO TABS
50.0000 mg | ORAL_TABLET | Freq: Every day | ORAL | Status: DC
Start: 2014-07-16 — End: 2014-07-17
  Administered 2014-07-16: 50 mg via ORAL
  Filled 2014-07-16 (×2): qty 1

## 2014-07-16 NOTE — Progress Notes (Signed)
PROGRESS NOTE  Kerri Vasquez [facility] ZDG:[id number] DOB: [birth year] DOA: 07/10/2014 PCP: No PCP Per Patient  HPI/Recap of past 24 hours: Patient is a 59 year old female with past mental history of gastric bypass treating severe morbid obesity as well as breast cancer with recurrent metastasis not to the liver, originally treated with ablation and now patient was admitted to [facility] on 3/11 for ablation again. Post procedure patient awoke with significant nausea and right upper quadrant abdominal pain as well as substernal chest discomfort. She had several episodes of vomiting and found to have pleural effusions consistent with heart failure as well as an elevated troponin level of 16 and acute renal failure. Patient transferred to [facility] ICU under the critical care service, cardiology consulted and an echocardiogram done 3/11 noted significantly depressed ejection fraction of 15-20 percent. She initially required pressor support. Patient felt to have circulatory shock and a non-STEMI.  She remained in ICU. She became unresponsive in the context of her chronic narcotic and other sedating medications. These medications were discontinued. 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  Patient felt to be medically stable and transferred to the floor on 3/15 with hospitalist assuming care on 3/16. Patient also seen by palliative care to after extensive discussion made adjustments after restarting her chronic pain medications.  Patient overnight with significant nausea and GERD. Requiring IV doses of Zofran and Phenergan. More tired today. I was concerned this could be due to increased dose of chronic pain medication, but patient would like to continue at current dose and hopefully less GERD tonight.  Assessment/Plan: Active Problems:   Breast cancer  metastasized to liver: Status post ablation by interventional radiology. Being followed by oncology, outpatient follow-up. Her chemotherapy will be on hold upon discharge    Pleural effusion   DM2 (diabetes mellitus, type 2): CBG stable, staying under 1:30   Essential hypertension: Blood pressure stable Elevated troponin: Felt to be secondary toTako-tsubo CM    Encephalopathy acute: Secondary to medication. Resolved : Acute circulatory shock causing acute systolic heart failure, acute renal failure, hypotension felt to be secondary to Tako-tsubo CM: Appreciate cardiology help. Shock it self resolved. Acute renal failure resolved. Patient has been aggressively diuresed and has diuresed 4 L. Cardiology plans for bedside echo prior to discharge to see for resolution    Weakness generalized: Physical therapy to see, likely will need home health    Chronic pain syndrome: Appreciate palliative care, home medicines restarted with dose adjusted.  ? Hyperthyroidism: Markedly suppressed TSH. Free T4 normal, awaiting free T3   Code Status: Full code  Family Communication: Spoke with daughter yesterday  Disposition Plan: Home, possibly tomorrow, once nausea improved   Consultants:  Palliative care  Cardiology  Critical care  Interventional radiology  Procedures:  Echocardiogram done 3/11: Ejection fraction 15-20%  Status post ablation of liver lesion done 3/11   Antibiotics:  Zosyn 3/12 times one day only    Objective: BP 142/75 mmHg  Pulse 110  Temp(Src) 97.7 F (36.5 C) (Oral)  Resp 19  Ht 5\' 6"  (1.676 m)  Wt 106.913 kg (235 lb 11.2 oz)  BMI 38.06 kg/m2  SpO2 98%  LMP 08/30/2006  Intake/Output Summary (Last 24 hours) at 07/16/14 1413 Last data filed at 07/16/14 0448  Gross per 24 hour  Intake    398 ml  Output    600 ml  Net   -  202 ml   Filed Weights   07/14/14 0500 07/15/14 0436 07/16/14 0522  Weight: 107.3 kg (236 lb 8.9 oz) 107.9 kg (237 lb 14 oz) 106.913  kg (235 lb 11.2 oz)    Exam:   General:  Alert and oriented 3, fatigue  Cardiovascular: Regular rate and rhythm, O9-G2, soft 2/6 systolic ejection murmur  Respiratory: Moderate inspiratory effort  Abdomen: Soft, obese, nontender, positive bowel sounds  Musculoskeletal: No clubbing or cyanosis, 2+ pitting edema from the knees down   Data Reviewed: Basic Metabolic Panel:  Recent Labs Lab 07/12/14 0705 07/13/14 0345 07/14/14 0455 07/14/14 0813 07/14/14 0930 07/15/14 0615 07/16/14 0544 07/16/14 0614  NA 138 134* 139 140  --  138  --  138  K 4.0 4.1 2.9* 3.0*  --  3.4*  --  4.5  CL 104 100 102 101  --  104  --  103  CO2 23 29 32  --   --  28  --  26  GLUCOSE 132* 145* 114* 124*  --  129*  --  135*  BUN 24* 33* 25* 25*  --  21  --  17  CREATININE 1.30* 1.56* 1.13* 1.00 1.06 1.09  --  0.94  CALCIUM 7.7* 7.3* 7.7*  --   --  7.7*  --  8.5  MG  --   --  2.3  --   --  2.4 2.2  --   PHOS  --   --  1.5*  --   --  1.5* 1.8*  --    Liver Function Tests:  Recent Labs Lab 07/11/14 0440 07/12/14 0705 07/13/14 0345  AST 143* 100* 70*  ALT 63* 51* 39*  ALKPHOS 140* 108 96  BILITOT 1.0 1.9* 2.5*  PROT 7.5 6.8 5.7*  ALBUMIN 3.6 3.0* 2.6*   No results for input(s): LIPASE, AMYLASE in the last 168 hours. No results for input(s): AMMONIA in the last 168 hours. CBC:  Recent Labs Lab 07/11/14 1340 07/12/14 0705 07/13/14 0345 07/14/14 0455 07/14/14 0813 07/14/14 0930  WBC 12.4* 12.4* 14.1* 7.9  --  7.4  NEUTROABS  --  11.1*  --   --   --   --   HGB 12.1 12.4 11.2* 10.9* 12.2 11.5*  HCT 35.4* 36.4 33.5* 32.9* 36.0 34.3*  MCV 95.2 95.0 96.5 96.2  --  96.3  PLT 244 237 197 195  --  177   Cardiac Enzymes:    Recent Labs Lab 07/11/14 1340 07/11/14 1842 07/12/14 0705  CKTOTAL 926*  --  687*  CKMB 79.6*  --  24.4*  TROPONINI 16.91* 18.76* 9.43*   BNP (last 3 results)  Recent Labs  07/11/14 1340  BNP 3001.6*    ProBNP (last 3 results) No results for input(s):  PROBNP in the last 8760 hours.  CBG:  Recent Labs Lab 07/15/14 1144 07/15/14 1659 07/15/14 2116 07/16/14 0607 07/16/14 1146  GLUCAP 87 100* 98 133* 125*    Recent Results (from the past 240 hour(s))  Surgical pcr screen     Status: Abnormal   Collection Time: 07/07/14 12:57 PM  Result Value Ref Range Status   MRSA, PCR POSITIVE (A) NEGATIVE Final   Staphylococcus aureus POSITIVE (A) NEGATIVE Final    Comment:        The Xpert SA Assay (FDA approved for NASAL specimens in patients over 47 years of age), is one component of a comprehensive surveillance program.  Test performance has been validated by [facility]  for patients greater than or equal to 46 year old. It is not intended to diagnose infection nor to guide or monitor treatment. Performed at [facility]   Culture, blood (routine x 2)     Status: None (Preliminary result)   Collection Time: 07/11/14  1:35 PM  Result Value Ref Range Status   Specimen Description BLOOD RIGHT HAND  Final   Special Requests BOTTLES DRAWN AEROBIC ONLY 10CC  Final   Culture   Final           BLOOD CULTURE RECEIVED NO GROWTH TO DATE CULTURE WILL BE HELD FOR 5 DAYS BEFORE ISSUING A FINAL NEGATIVE REPORT Performed at [facility]    Report Status PENDING  Incomplete  Culture, blood (routine x 2)     Status: None (Preliminary result)   Collection Time: 07/11/14  1:40 PM  Result Value Ref Range Status   Specimen Description BLOOD RIGHT HAND  Final   Special Requests BOTTLES DRAWN AEROBIC ONLY 10CC  Final   Culture   Final           BLOOD CULTURE RECEIVED NO GROWTH TO DATE CULTURE WILL BE HELD FOR 5 DAYS BEFORE ISSUING A FINAL NEGATIVE REPORT Performed at [facility]    Report Status PENDING  Incomplete  MRSA PCR Screening     Status: None   Collection Time: 07/11/14  5:55 PM  Result Value Ref Range Status   MRSA by PCR NEGATIVE NEGATIVE Final    Comment:        The GeneXpert MRSA Assay (FDA approved for  NASAL specimens only), is one component of a comprehensive MRSA colonization surveillance program. It is not intended to diagnose MRSA infection nor to guide or monitor treatment for MRSA infections. Performed at [facility]   Culture, Urine     Status: None   Collection Time: 07/11/14  6:54 PM  Result Value Ref Range Status   Specimen Description URINE, CLEAN CATCH  Final   Special Requests NONE  Final   Colony Count NO GROWTH Performed at [facility]   Final   Culture NO GROWTH Performed at [facility]   Final   Report Status 07/13/2014 FINAL  Final     Studies: No results found.  Scheduled Meds: . antiseptic oral rinse  7 mL Mouth Rinse BID  . carvedilol  3.125 mg Oral BID WC  . docusate sodium  100 mg Oral BID  . enoxaparin (LOVENOX) injection  40 mg Subcutaneous Q24H  . feeding supplement (ENSURE COMPLETE)  237 mL Oral Q1500  . feeding supplement (GLUCERNA SHAKE)  237 mL Oral BID BM  . FLUoxetine  40 mg Oral QAC breakfast  . insulin aspart  0-9 Units Subcutaneous TID WC  . levothyroxine  300 mcg Oral QAC breakfast  . methocarbamol  750 mg Oral QID  . OxyCODONE  20 mg Oral Q12H  . pantoprazole  40 mg Oral Daily  . pregabalin  200 mg Oral BID  . sodium chloride  10-40 mL Intracatheter Q12H  . spironolactone  12.5 mg Oral Daily    Continuous Infusions:    Time spent: 15 minutes  [facility] Pager [phone]. If 7PM-7AM, please contact night-coverage at [email or web address], password [facility] 07/16/2014, 2:13 PM  LOS: 5 days

## 2014-07-16 NOTE — Progress Notes (Signed)
Utilization review completed.  

## 2014-07-16 NOTE — Progress Notes (Addendum)
Patient Name: Kerri Vasquez Date of Encounter: 07/16/2014    SUBJECTIVE:  Cath on 3/15 with no significant CAD. LVEDP 10.  Patient overnight with significant nausea and GERD. Requiring IV doses of Zofran and Phenergan. More tired today. Denies dyspnea. SBP much improved.   I did bedside echo and EF still 20-25%   Intake/Output Summary (Last 24 hours) at 07/16/14 1728 Last data filed at 07/16/14 0448  Gross per 24 hour  Intake    398 ml  Output    600 ml  Net   -202 ml   Scheduled Meds: . antiseptic oral rinse  7 mL Mouth Rinse BID  . carvedilol  3.125 mg Oral BID WC  . docusate sodium  100 mg Oral BID  . enoxaparin (LOVENOX) injection  40 mg Subcutaneous Q24H  . feeding supplement (ENSURE COMPLETE)  237 mL Oral Q1500  . feeding supplement (GLUCERNA SHAKE)  237 mL Oral BID BM  . FLUoxetine  40 mg Oral QAC breakfast  . insulin aspart  0-9 Units Subcutaneous TID WC  . levothyroxine  300 mcg Oral QAC breakfast  . methocarbamol  750 mg Oral QID  . OxyCODONE  20 mg Oral Q12H  . pantoprazole  40 mg Oral Daily  . pregabalin  200 mg Oral BID  . sodium chloride  10-40 mL Intracatheter Q12H  . spironolactone  12.5 mg Oral Daily   Continuous Infusions:  PRN Meds:.sodium chloride, albuterol, alum & mag hydroxide-simeth, nitroGLYCERIN, ondansetron (ZOFRAN) IV, oxyCODONE, promethazine, senna-docusate, sodium chloride, tiZANidine   Physical Exam: Filed Vitals:   07/15/14 1459 07/15/14 2040 07/16/14 0522 07/16/14 1140  BP: 112/46 103/52 134/86 142/75  Pulse: 83 81 110   Temp: 98.8 F (37.1 C) 99 F (37.2 C) 99.1 F (37.3 C) 97.7 F (36.5 C)  TempSrc: Oral Oral Oral Oral  Resp: 18 20 19 19   Height:      Weight:   106.913 kg (235 lb 11.2 oz)   SpO2: 94% 98% 98%    General:  Lying in bed. No resp difficulty HEENT: normal Neck: supple.  no JVD. Carotids 2+ bilat; no bruits. No lymphadenopathy or thryomegaly appreciated. Cor: PMI nondisplaced. Regular rate &  rhythm. No rubs, gallops or murmurs. Lungs: clear Abdomen: obese soft, nontender, nondistended. No hepatosplenomegaly. No bruits or masses. Good bowel sounds. Extremities: no cyanosis, clubbing, rash, edema Neuro: alert & orientedx3, cranial nerves grossly intact. moves all 4 extremities w/o difficulty. Affect pleasant  TELE: NSR/sinus tach 80-110 (reviewed personally)  LABS: Basic Metabolic Panel:  Recent Labs  07/15/14 0615 07/16/14 0544 07/16/14 0614  NA 138  --  138  K 3.4*  --  4.5  CL 104  --  103  CO2 28  --  26  GLUCOSE 129*  --  135*  BUN 21  --  17  CREATININE 1.09  --  0.94  CALCIUM 7.7*  --  8.5  MG 2.4 2.2  --   PHOS 1.5* 1.8*  --    CBC:  Recent Labs  07/14/14 0455 07/14/14 0813 07/14/14 0930  WBC 7.9  --  7.4  HGB 10.9* 12.2 11.5*  HCT 32.9* 36.0 34.3*  MCV 96.2  --  96.3  PLT 195  --  177   Cardiac Enzymes: No results for input(s): CKTOTAL, CKMB, CKMBINDEX, TROPONINI in the last 72 hours.   Radiology/Studies:  No new data   ASSESSMENT:  1. Acute systolic HF with good diuresis and O2 sats    --  EF 20-25%. 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 Cardiogenic shock resolved 3. Acute kidney injury, resolved 4. Metastatic breast cancer s/p IR ablation of liver metastasis this admission 5. 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 Hypokalemia  Plan/discussion:  Renal function stable s/p cath. 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 Hopefully EF will recover quickly - still 20-25% today. Doubt nausea is low output HF given findings on cath but still have to keep in the backs of our mind if not improving.   Will add back losartan 50. Can go home in am if feeling better. Will need close f/u in HF Clinic next week.   If goes home tomorrow HF meds for now will be:  Carvedilol 3.125 bid Losartan 50 daily Spiro 12.5 daily  Our pharmacist spoke with Dr. Marin Olp and will continue to hold Ibrance and Faslodex on d/c. He will re-evaluate at end of month. Doubt these are  implicated in her cardiomyopathy.   Palak Tercero BensimhonMD 07/16/2014, 5:28 PM

## 2014-07-16 NOTE — Progress Notes (Signed)
   07/16/14 1500  Clinical Encounter Type  Visited With Patient;Health care provider  Visit Type Follow-up  Advance Directives (For Healthcare)  Does patient have an advance directive? No  Would patient like information on creating an advanced directive? Yes - Educational materials given   Chaplain was referred to patient via spiritual care consult. Consult indicated patient would like to complete or update an advanced directive. Chaplain followed up with patient this afternoon. Patient gave patient a blank copy of the advance directive forms. Patient is primarily interested in establishing who he healthcare power of attorney is. Please contact spiritual care department when patient is ready to have these documents signed and completed.  Gar Ponto, Chaplain  3:46 PM

## 2014-07-16 NOTE — Progress Notes (Signed)
   07/16/14 0900  Clinical Encounter Type  Visited With Patient  Visit Type Initial   Chaplain stopped by patient's room but the patient was sleeping. Chaplain will follow up at a later time.  Gar Ponto, Chaplain  9:44 AM

## 2014-07-16 NOTE — Progress Notes (Signed)
Referring Physician(s): Ennever  Subjective:  Liver lesion metastasis MW ablation 3/11 in IR Post NSTEMI Heart cath -- good report Followed by CCM Feeling better daily Still very tired Possibly home in am  Allergies: Influenza vac split; Ritalin; Zostavax; and Adhesive  Medications: Prior to Admission medications   Medication Sig Start Date End Date Taking? Authorizing Provider  acetaminophen (TYLENOL) 500 MG tablet Take 1,000 mg by mouth every 4 (four) hours as needed for moderate pain.   Yes Historical Provider, MD  FLUoxetine (PROZAC) 40 MG capsule Take 40 mg by mouth daily before breakfast.    Yes Historical Provider, MD  fulvestrant (FASLODEX) 250 MG/5ML injection Inject 250 mg into the muscle every 30 (thirty) days. One injection each buttock over 1-2 minutes. Warm prior to use.   Yes Historical Provider, MD  furosemide (LASIX) 20 MG tablet TAKE 1 TABLET BY MOUTH EVERY DAY Patient taking differently: Take one tablet by mouth every mornig 02/04/14  Yes Volanda Napoleon, MD  levothyroxine (SYNTHROID, LEVOTHROID) 300 MCG tablet Take 300 mcg by mouth daily before breakfast.   Yes Historical Provider, MD  losartan-hydrochlorothiazide (HYZAAR) 100-25 MG per tablet Take 1 tablet by mouth every morning.    Yes Historical Provider, MD  methocarbamol (ROBAXIN) 750 MG tablet Take 750 mg by mouth 4 (four) times daily.   Yes Historical Provider, MD  methylphenidate (RITALIN) 5 MG tablet Take 1 tablet (5 mg total) by mouth 2 (two) times daily. Patient taking differently: Take 5 mg by mouth every morning.  05/14/14  Yes Volanda Napoleon, MD  oxyCODONE (OXY IR/ROXICODONE) 5 MG immediate release tablet Take 5 mg by mouth every 4 (four) hours as needed (For pain.).   Yes Historical Provider, MD  OxyCODONE (OXYCONTIN) 20 mg T12A 12 hr tablet Take 20 mg by mouth every 12 (twelve) hours.   Yes Historical Provider, MD  oxyCODONE-acetaminophen (PERCOCET) 10-325 MG per tablet Take 1 tablet by mouth  every 6 (six) hours as needed for pain. Patient taking differently: Take 1 tablet by mouth every 4 (four) hours as needed for pain.  01/05/14  Yes Virgel Manifold, MD  palbociclib [facility]) 100 MG capsule Take 1 pill a day for 21 days on and 7 days off.  Take with food 05/14/14  Yes Volanda Napoleon, MD  pregabalin (LYRICA) 200 MG capsule Take 200 mg by mouth 3 (three) times daily.   Yes Historical Provider, MD  promethazine (PHENERGAN) 25 MG tablet Take 1 tablet (25 mg total) by mouth every 8 (eight) hours as needed for nausea or vomiting. Patient taking differently: Take 25 mg by mouth every 6 (six) hours as needed for nausea or vomiting.  11/25/13  Yes Volanda Napoleon, MD  temazepam (RESTORIL) 15 MG capsule TAKE 1 CAPSULE BY MOUTH EVERY DAY AT BEDTIME AS NEEDED FOR SLEEP Patient taking differently: TAKE 1 CAPSULE BY MOUTH EVERY DAY AT BEDTIME AS NEEDED FOR SLEEP////07/07/14 per daughter may be repeated x 1 06/30/14  Yes Volanda Napoleon, MD  tiZANidine (ZANAFLEX) 4 MG tablet Take 4 mg by mouth every 6 (six) hours as needed for muscle spasms.   Yes Historical Provider, MD  potassium chloride (K-DUR) 10 MEQ tablet Take 4 tablets (40 mEq total) by mouth daily. Patient not taking: Reported on 06/29/2014 05/27/14   Volanda Napoleon, MD     Vital Signs: BP 142/75 mmHg  Pulse 110  Temp(Src) 97.7 F (36.5 C) (Oral)  Resp 19  Ht 5\' 6"  (1.676 m)  Wt 106.913 kg (235 lb 11.2 oz)  BMI 38.06 kg/m2  SpO2 98%  LMP 08/30/2006  Physical Exam  Constitutional: She is oriented to person, place, and time.  Cardiovascular: Normal rate and regular rhythm.   Pulmonary/Chest: Breath sounds normal. She is in respiratory distress. She has no wheezes.  Abdominal: Soft. Bowel sounds are normal. There is no tenderness.  Musculoskeletal: Normal range of motion.  Neurological: She is alert and oriented to person, place, and time.  Nursing note and vitals reviewed.   Imaging: Dg Chest Port 1 View  07/13/2014   CLINICAL  DATA:  Central line placement  EXAM: PORTABLE CHEST - 1 VIEW  COMPARISON:  07/11/2014  FINDINGS: There is a new right jugular central line with tip in the expected location of the low SVC. There is no pneumothorax. Opacities persist throughout both lungs, somewhat more confluent in the central lung regions compared to 07/11/2014.  IMPRESSION: New right-sided central line. No pneumothorax. Worsening airspace opacities.   Electronically Signed   By: Andreas Newport M.D.   On: 07/13/2014 04:05    Labs:  CBC:  Recent Labs  07/12/14 0705 07/13/14 0345 07/14/14 0455 07/14/14 0813 07/14/14 0930  WBC 12.4* 14.1* 7.9  --  7.4  HGB 12.4 11.2* 10.9* 12.2 11.5*  HCT 36.4 33.5* 32.9* 36.0 34.3*  PLT 237 197 195  --  177    COAGS:  Recent Labs  07/07/14 1005 07/11/14 1842 07/12/14 0705  INR 1.11 1.21 1.20  APTT 36 29  --     BMP:  Recent Labs  07/13/14 0345 07/14/14 0455 07/14/14 0813 07/14/14 0930 07/15/14 0615 07/16/14 0614  NA 134* 139 140  --  138 138  K 4.1 2.9* 3.0*  --  3.4* 4.5  CL 100 102 101  --  104 103  CO2 29 32  --   --  28 26  GLUCOSE 145* 114* 124*  --  129* 135*  BUN 33* 25* 25*  --  21 17  CALCIUM 7.3* 7.7*  --   --  7.7* 8.5  CREATININE 1.56* 1.13* 1.00 1.06 1.09 0.94  GFRNONAA 35* 52*  --  56* 54* 65*  GFRAA 41* 60*  --  65* 63* 75*    LIVER FUNCTION TESTS:  Recent Labs  07/07/14 1005 07/11/14 0440 07/12/14 0705 07/13/14 0345  BILITOT 0.5 1.0 1.9* 2.5*  AST 23 143* 100* 70*  ALT 17 63* 51* 39*  ALKPHOS 89 140* 108 96  PROT 7.1 7.5 6.8 5.7*  ALBUMIN 3.6 3.6 3.0* 2.6*    Assessment and Plan:  Liver lesion metastasis MW ablation 3/11 Better daily Plan for clinic visit 2-4 weeks CT/PET 3-6 months per Dr Vernard Gambles We will call pt for appt Thanks to CCM for great care  Signed: Verdis Koval A 07/16/2014, 2:05 PM   I spent a total of 15 Minutes in face to face in clinical consultation/evaluation, greater than 50% of which was  counseling/coordinating care for liver lesion MW ablation

## 2014-07-17 DIAGNOSIS — I952 Hypotension due to drugs: Secondary | ICD-10-CM

## 2014-07-17 DIAGNOSIS — R110 Nausea: Secondary | ICD-10-CM

## 2014-07-17 LAB — GLUCOSE, CAPILLARY
GLUCOSE-CAPILLARY: 89 mg/dL (ref 70–99)
GLUCOSE-CAPILLARY: 112 mg/dL — AB (ref 70–99)
Glucose-Capillary: 97 mg/dL (ref 70–99)
Glucose-Capillary: 103 mg/dL — ABNORMAL HIGH (ref 70–99)

## 2014-07-17 LAB — CBC
HCT: 30.8 % — ABNORMAL LOW (ref 36.0–46.0)
Hemoglobin: 10.1 g/dL — ABNORMAL LOW (ref 12.0–15.0)
MCH: 32.8 pg (ref 26.0–34.0)
MCHC: 32.8 g/dL (ref 30.0–36.0)
MCV: 100 fL (ref 78.0–100.0)
PLATELETS: 162 10*3/uL (ref 150–400)
RBC: 3.08 MIL/uL — ABNORMAL LOW (ref 3.87–5.11)
RDW: 14.2 % (ref 11.5–15.5)
WBC: 6.3 10*3/uL (ref 4.0–10.5)

## 2014-07-17 LAB — CULTURE, BLOOD (ROUTINE X 2)
CULTURE: NO GROWTH
Culture: NO GROWTH

## 2014-07-17 LAB — PHOSPHORUS: Phosphorus: 1.8 mg/dL — ABNORMAL LOW (ref 2.3–4.6)

## 2014-07-17 LAB — MAGNESIUM: MAGNESIUM: 2.2 mg/dL (ref 1.5–2.5)

## 2014-07-17 LAB — T3, FREE: T3, Free: 1.3 pg/mL — ABNORMAL LOW (ref 2.0–4.4)

## 2014-07-17 MED ORDER — SODIUM CHLORIDE 0.9 % IV BOLUS (SEPSIS)
500.0000 mL | Freq: Once | INTRAVENOUS | Status: AC
  Administered 2014-07-17: 500 mL via INTRAVENOUS

## 2014-07-17 MED ORDER — OXYCODONE HCL ER 15 MG PO T12A
15.0000 mg | EXTENDED_RELEASE_TABLET | Freq: Two times a day (BID) | ORAL | Status: DC
  Administered 2014-07-17 – 2014-07-18 (×2): 15 mg via ORAL
  Filled 2014-07-17 (×2): qty 1

## 2014-07-17 MED ORDER — LOSARTAN POTASSIUM 25 MG PO TABS
12.5000 mg | ORAL_TABLET | Freq: Every day | ORAL | Status: DC
  Administered 2014-07-18: 12.5 mg via ORAL
  Filled 2014-07-17: qty 0.5

## 2014-07-17 NOTE — Progress Notes (Signed)
Patient Name: Kerri Vasquez Date of Encounter: 07/17/2014    SUBJECTIVE:  Cath on 3/15 with no significant CAD. LVEDP 10.  Patient overnight again with significant nausea and GERD. Apparently SBP low last night and got IVF bolus. Repeat SBP with manual cuff was ok.   I did bedside echo and EF still 20-25%   Intake/Output Summary (Last 24 hours) at 07/17/14 1905 Last data filed at 07/17/14 0109  Gross per 24 hour  Intake    500 ml  Output      0 ml  Net    500 ml   Scheduled Meds: . antiseptic oral rinse  7 mL Mouth Rinse BID  . carvedilol  3.125 mg Oral BID WC  . docusate sodium  100 mg Oral BID  . enoxaparin (LOVENOX) injection  40 mg Subcutaneous Q24H  . feeding supplement (ENSURE COMPLETE)  237 mL Oral Q1500  . feeding supplement (GLUCERNA SHAKE)  237 mL Oral BID BM  . FLUoxetine  40 mg Oral QAC breakfast  . insulin aspart  0-9 Units Subcutaneous TID WC  . levothyroxine  300 mcg Oral QAC breakfast  . [START ON 07/18/2014] losartan  12.5 mg Oral Daily  . methocarbamol  750 mg Oral QID  . OxyCODONE  15 mg Oral Q12H  . pantoprazole  40 mg Oral Daily  . pregabalin  200 mg Oral BID  . sodium chloride  10-40 mL Intracatheter Q12H  . spironolactone  12.5 mg Oral Daily   Continuous Infusions:  PRN Meds:.albuterol, alum & mag hydroxide-simeth, nitroGLYCERIN, ondansetron (ZOFRAN) IV, oxyCODONE, promethazine, senna-docusate, sodium chloride, tiZANidine   Physical Exam: Filed Vitals:   07/17/14 0531 07/17/14 0534 07/17/14 0938 07/17/14 1454  BP: 84/49 82/39 92/61  140/78  Pulse: 74   98  Temp: 98.4 F (36.9 C)   98.2 F (36.8 C)  TempSrc: Oral   Oral  Resp: 17     Height:      Weight: 107.049 kg (236 lb)     SpO2: 95%   99%   General:  Lying in bed. No resp difficulty. nauseated HEENT: normal Neck: supple.  no JVD. Carotids 2+ bilat; no bruits. No lymphadenopathy or thryomegaly appreciated. Cor: PMI nondisplaced. Regular rate & rhythm. No rubs, gallops  or murmurs. Lungs: clear Abdomen: obese soft, nontender, nondistended. No hepatosplenomegaly. No bruits or masses. Good bowel sounds. Extremities: no cyanosis, clubbing, rash, edema Neuro: alert & orientedx3, cranial nerves grossly intact. moves all 4 extremities w/o difficulty. Affect pleasant  TELE: NSR/sinus tach 80-115 (reviewed personally)  LABS: Basic Metabolic Panel:  Recent Labs  07/15/14 0615 07/16/14 0544 07/16/14 0614 07/17/14 0457  NA 138  --  138  --   K 3.4*  --  4.5  --   CL 104  --  103  --   CO2 28  --  26  --   GLUCOSE 129*  --  135*  --   BUN 21  --  17  --   CREATININE 1.09  --  0.94  --   CALCIUM 7.7*  --  8.5  --   MG 2.4 2.2  --  2.2  PHOS 1.5* 1.8*  --  1.8*   CBC:  Recent Labs  07/17/14 0457  WBC 6.3  HGB 10.1*  HCT 30.8*  MCV 100.0  PLT 162   Cardiac Enzymes: No results for input(s): CKTOTAL, CKMB, CKMBINDEX, TROPONINI in the last 72 hours.   Radiology/Studies:  No new  data   ASSESSMENT:  1. Acute systolic HF with good diuresis and O2 sats    --EF 20-25%. 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 Cardiogenic shock resolved 3. Acute kidney injury, resolved 4. Metastatic breast cancer s/p IR ablation of liver metastasis this admission 5. 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 Hopefully EF will recover soon - still 20-25% on 3/17. Doubt nausea is low output HF given findings on cath but still have to keep in the backs of our mind if not improving.   Cut losartan to 12.5. Can go home in am if feeling better. Can give another 500cc NS if needed. Will need close f/u in HF Clinic next week.   If goes home tomorrow HF meds for now will be:  Carvedilol 3.125 bid Losartan 12.5 daily Spiro 12.5 daily  Our pharmacist spoke with Dr. Marin Olp and will continue to hold Ibrance and Faslodex on d/c. He will re-evaluate at end of month. Doubt these are implicated in her cardiomyopathy.    Daniel BensimhonMD 07/17/2014, 7:05 PM

## 2014-07-17 NOTE — Progress Notes (Signed)
Pt BP needs to be taken manually, results from dynamap were not accurate.

## 2014-07-17 NOTE — Progress Notes (Signed)
Patient had low BP overnight in the 70-80s/50s.  Notified K. Schorr with [facility] who ordered 500cc NS bolus which was given at [place].  When BP did not improve K. Schorr advised that Cardiology be notified.   Dr. Oval Linsey contacted at [phone]. He advised that since the patient is asymptomatic, she should be monitored until the the primary team evaluates the patient further this morning.  He also instructed that the AM beta blocker be held. Patient was monitored closely.  Her BP remained low.  She continues to be asymptomatic.

## 2014-07-17 NOTE — Progress Notes (Signed)
Physical Therapy Treatment Patient Details Name: Kerri Vasquez MRN: [id number] DOB: [birth year] Today's Date: 07/17/2014    History of Present Illness 59 y.o. female Hx of breast cancer with mets to liver. s/p Liver lesion ablation 3/11, NSTEMI post-op, Heart cath 3/15.    PT Comments    Patient is progressing towards physical therapy goals, ambulating up to 145 feet today with a rolling walker for support. Complains of upper abdominal discomfort she attributes to GERD. RN notified, head of bed elevated at end of therapy session. She tolerated therapeutic exercises well although complains of feeling very fatigued today. Patient will continue to benefit from skilled physical therapy services to further improve independence with functional mobility.   Follow Up Recommendations  Home health PT;Supervision for mobility/OOB     Equipment Recommendations  Rolling walker with 5" wheels;3in1 (PT)    Recommendations for Other Services OT consult     Precautions / Restrictions Precautions Precautions: Fall Restrictions Weight Bearing Restrictions: No    Mobility  Bed Mobility Overal bed mobility: Modified Independent             General bed mobility comments: Requires extra time. Able to enter and exit bed.  Transfers Overall transfer level: Needs assistance Equipment used: Rolling walker (2 wheeled) Transfers: Sit to/from Stand Sit to Stand: Min guard         General transfer comment: Min guard for safety. VC for hand placement. Reaches for middle of walker. No dizziness upon standing. Pt reports weakness onlyl.  Ambulation/Gait Ambulation/Gait assistance: Min guard Ambulation Distance (Feet): 145 Feet Assistive device: Rolling walker (2 wheeled) Gait Pattern/deviations: Step-through pattern;Decreased stride length;Trunk flexed Gait velocity: decreased   General Gait Details: Intermittent cues for walker placement for proximity. Required 2 standing rest breaks to  complete distance. HR to 112. Beginning to self correct posture with only one VC to stand upright during bout.   Stairs            Wheelchair Mobility    Modified Rankin (Stroke Patients Only)       Balance                                    Cognition Arousal/Alertness: Awake/alert Behavior During Therapy: WFL for tasks assessed/performed Overall Cognitive Status: Within Functional Limits for tasks assessed                      Exercises General Exercises - Lower Extremity Ankle Circles/Pumps: AROM;Both;10 reps;Seated Quad Sets: Strengthening;Both;5 reps;Supine Gluteal Sets: Strengthening;Both;5 reps;Supine Long Arc Quad: Strengthening;Both;10 reps;Seated    General Comments        Pertinent Vitals/Pain Pain Assessment: No/denies pain Pain Score:  ("My reflux is bothering me the most." No value given) Pain Location: upper abdomen Pain Descriptors / Indicators: Burning Pain Intervention(s): Repositioned;Monitored during session  HR 112 Denies dizziness/lightheadedness throughout therapy session    Home Living                      Prior Function            PT Goals (current goals can now be found in the care plan section) Acute Rehab PT Goals Patient Stated Goal: Go home PT Goal Formulation: With patient Time For Goal Achievement: 07/29/14 Potential to Achieve Goals: Fair Progress towards PT goals: Progressing toward goals    Frequency  Min 3X/week    PT  Plan Current plan remains appropriate    Co-evaluation             End of Session Equipment Utilized During Treatment: Gait belt Activity Tolerance: Patient limited by fatigue Patient left: in chair;with call bell/phone within reach (declines SCD's to be applied)     Time: 4734-0370 PT Time Calculation (min) (ACUTE ONLY): 15 min  Charges:  $Gait Training: 8-22 mins                    G Codes:      [name] Newer 07-28-14, 10:49 AM Elayne Snare, [place]

## 2014-07-17 NOTE — Care Management Note (Signed)
    Page 1 of 2   07/17/2014     4:45:25 PM CARE MANAGEMENT NOTE 07/17/2014  Patient:  Kerri Vasquez   Account Number:  [id number]  Date Initiated:  07/13/2014  Documentation initiated by:  Kerri Vasquez,Kerri Vasquez  Subjective/Objective Assessment:   dx breast CA w/liver mets/NSTEMI; lives with dtr     Action/Plan:   Anticipated DC Date:  07/18/2014   Anticipated DC Plan:  [place]  CM consult      [place]   Choice offered to / List presented to:  C-4 Adult Children   DME arranged  [place]      DME agency  [place] arranged  HH-1 RN  [place].   Status of service:  Completed, signed off Medicare Important Message given?  YES (If response is "NO", the following Medicare IM given date fields will be blank) Date Medicare IM given:  07/15/2014 Medicare IM given by:  Kerri Vasquez Date Additional Medicare IM given:  07/17/2014 Additional Medicare IM given by:  Kerri Vasquez  Discharge Disposition:  [place]  Per UR Regulation:  Reviewed for med. necessity/level of care/duration of stay  If discussed at [place] of Stay Meetings, dates discussed:   07/16/2014    Comments:  07/17/14- [place] RN, BSN [phone] Orders in for HH-RN/PT/OT- spoke with pt at bedside- list for [facility] in [place] given to pt for her and daughter to review- daughter had just left- per pt request call made to daughter to f/u on what agency to use for [facility] services- daughter Kerri Vasquez states that pt has used [facility] in past- and that would be the agency of their choice for [facility] services again- daughter also would like a wheelchair for home- order has been placed for w/c also- referral for [facility] called to [place] with [facility] for HH-RN/PT/OT- plan for d/c tomorrow- call also made to [facility] for DME need - w/c to be  delivered to room prior to discharge.  07/13/14 [place] MSN BSN CCM Met with pt and dtr @ bedside.  Pt is from [place], is living with dtr in [place] but dtr takes pt back to [place] 3 x yearly to see her PCP.  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  Follows in [place] with Dr Marin Olp @ [facility].

## 2014-07-17 NOTE — Progress Notes (Signed)
PROGRESS NOTE  Jennet Scroggin [facility] KWI:[id number] DOB: [birth year] DOA: 07/10/2014 PCP: No PCP Per Patient  HPI/Recap of past 24 hours: Patient is a 59 year old female with past mental history of gastric bypass treating severe morbid obesity as well as breast cancer with recurrent metastasis not to the liver, originally treated with ablation and now patient was admitted to [facility] long hospital on 3/11 for ablation again. Post procedure patient awoke with significant nausea and right upper quadrant abdominal pain as well as substernal chest discomfort. She had several episodes of vomiting and found to have pleural effusions consistent with heart failure as well as an elevated troponin level of 16 and acute renal failure. Patient transferred to [facility] ICU under the critical care service, cardiology consulted and an echocardiogram done 3/11 noted significantly depressed ejection fraction of 15-20 percent. She initially required pressor support. Patient felt to have circulatory shock and a non-STEMI.  She remained in ICU. She became unresponsive in the context of her chronic narcotic and other sedating medications. These medications were discontinued. 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  Patient felt to be medically stable and transferred to the floor on 3/15 with hospitalist assuming care on 3/16. Patient also seen by palliative care to after extensive discussion made adjustments after restarting her chronic pain medications.  Patient for the last 2 days with significant nausea and GERD. Requiring IV doses of Zofran and Phenergan. Fatigued as well  Assessment/Plan: Active Problems:   Breast cancer metastasized to liver: Status post ablation by interventional radiology. Being followed by oncology, outpatient follow-up. 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 Resolved : Acute circulatory shock causing acute systolic heart failure, acute renal failure, hypotension felt to be secondary to Tako-tsubo CM: Appreciate cardiology help. Shock it self resolved. Acute renal failure resolved. Patient has been aggressively diuresed and has diuresed 4 L. follow-up echocardiogram confirms persistent heart failure. Patient started on losartan last night at 50 mg and was hypotensive. She was asymptomatic from this, however today more nausea which may be related to hypotension.  dose decreased to 12.5    Weakness generalized: Physical therapy to see, likely will need home health    Chronic pain syndrome: Appreciate palliative care, home medicines restarted with dose adjusted. As above.  ? Hyperthyroidism: Markedly suppressed TSH. Free T4 normal, free T3 low. Recommend rechecking of labs in 6-12 weeks   Code Status: Full code  Family Communication: Spoke with daughter at the bedside  Disposition Plan: Home, possibly tomorrow, once nausea improved   Consultants:  Palliative care  Cardiology  Critical care  Interventional radiology  Procedures:  Echocardiogram done 3/11: Ejection fraction 15-20%  Status post ablation of liver lesion done 3/11   Antibiotics:  Zosyn 3/12 times one day only    Objective: BP 140/78 mmHg  Pulse 98  Temp(Src) 98.2 F (36.8 C) (Oral)  Resp 17  Ht 5\' 6"  (1.676 m)  Wt 107.049 kg (236 lb)  BMI 38.11 kg/m2  SpO2 99%  LMP 08/30/2006  Intake/Output Summary (Last 24 hours) at 07/17/14 1607 Last data filed at 07/17/14 0109  Gross per 24 hour  Intake    500 ml  Output      0 ml  Net    500 ml   Filed Weights   07/15/14 0436 07/16/14 0522 07/17/14 0531  Weight: 107.9 kg (237  lb 14 oz) 106.913 kg (235 lb 11.2 oz) 107.049 kg (236 lb)    Exam:   General:  Alert and oriented 3, distress secondary to nausea  Cardiovascular: Regular rate and rhythm, O3-Z8, soft 2/6 systolic ejection murmur  Respiratory: Moderate inspiratory effort  Abdomen: Soft, obese, nontender, positive bowel sounds  Musculoskeletal: No clubbing or cyanosis, 2+ pitting edema from the knees down   Data Reviewed: Basic Metabolic Panel:  Recent Labs Lab 07/12/14 0705 07/13/14 0345 07/14/14 0455 07/14/14 0813 07/14/14 0930 07/15/14 0615 07/16/14 0544 07/16/14 0614 07/17/14 0457  NA 138 134* 139 140  --  138  --  138  --   K 4.0 4.1 2.9* 3.0*  --  3.4*  --  4.5  --   CL 104 100 102 101  --  104  --  103  --   CO2 23 29 32  --   --  28  --  26  --   GLUCOSE 132* 145* 114* 124*  --  129*  --  135*  --   BUN 24* 33* 25* 25*  --  21  --  17  --   CREATININE 1.30* 1.56* 1.13* 1.00 1.06 1.09  --  0.94  --   CALCIUM 7.7* 7.3* 7.7*  --   --  7.7*  --  8.5  --   MG  --   --  2.3  --   --  2.4 2.2  --  2.2  PHOS  --   --  1.5*  --   --  1.5* 1.8*  --  1.8*   Liver Function Tests:  Recent Labs Lab 07/11/14 0440 07/12/14 0705 07/13/14 0345  AST 143* 100* 70*  ALT 63* 51* 39*  ALKPHOS 140* 108 96  BILITOT 1.0 1.9* 2.5*  PROT 7.5 6.8 5.7*  ALBUMIN 3.6 3.0* 2.6*   No results for input(s): LIPASE, AMYLASE in the last 168 hours. No results for input(s): AMMONIA in the last 168 hours. CBC:  Recent Labs Lab 07/12/14 0705 07/13/14 0345 07/14/14 0455 07/14/14 0813 07/14/14 0930 07/17/14 0457  WBC 12.4* 14.1* 7.9  --  7.4 6.3  NEUTROABS 11.1*  --   --   --   --   --   HGB 12.4 11.2* 10.9* 12.2 11.5* 10.1*  HCT 36.4 33.5* 32.9* 36.0 34.3* 30.8*  MCV 95.0 96.5 96.2  --  96.3 100.0  PLT 237 197 195  --  177 162   Cardiac Enzymes:    Recent Labs Lab 07/11/14 1340 07/11/14 1842 07/12/14 0705  CKTOTAL 926*  --  687*  CKMB 79.6*  --  24.4*  TROPONINI 16.91* 18.76* 9.43*    BNP (last 3 results)  Recent Labs  07/11/14 1340  BNP 3001.6*    ProBNP (last 3 results) No results for input(s): PROBNP in the last 8760 hours.  CBG:  Recent Labs Lab 07/16/14 1146 07/16/14 1648 07/16/14 2106 07/17/14 0619 07/17/14 1204  GLUCAP 125* 108* 98 89 112*    Recent Results (from the past 240 hour(s))  Culture, blood (routine x 2)     Status: None   Collection Time: 07/11/14  1:35 PM  Result Value Ref Range Status   Specimen Description BLOOD RIGHT HAND  Final   Special Requests BOTTLES DRAWN  AEROBIC ONLY 10CC  Final   Culture   Final    NO GROWTH 5 DAYS Performed at [facility]    Report Status 07/17/2014 FINAL  Final  Culture, blood (routine x 2)     Status: None   Collection Time: 07/11/14  1:40 PM  Result Value Ref Range Status   Specimen Description BLOOD RIGHT HAND  Final   Special Requests BOTTLES DRAWN AEROBIC ONLY 10CC  Final   Culture   Final    NO GROWTH 5 DAYS Performed at [facility]    Report Status 07/17/2014 FINAL  Final  MRSA PCR Screening     Status: None   Collection Time: 07/11/14  5:55 PM  Result Value Ref Range Status   MRSA by PCR NEGATIVE NEGATIVE Final    Comment:        The GeneXpert MRSA Assay (FDA approved for NASAL specimens only), is one component of a comprehensive MRSA colonization surveillance program. It is not intended to diagnose MRSA infection nor to guide or monitor treatment for MRSA infections. Performed at [facility]   Culture, Urine     Status: None   Collection Time: 07/11/14  6:54 PM  Result Value Ref Range Status   Specimen Description URINE, CLEAN CATCH  Final   Special Requests NONE  Final   Colony Count NO GROWTH Performed at [facility]   Final   Culture NO GROWTH Performed at [facility]   Final   Report Status 07/13/2014 FINAL  Final     Studies: No results found.  Scheduled Meds: . antiseptic oral rinse  7 mL Mouth Rinse BID  .  carvedilol  3.125 mg Oral BID WC  . docusate sodium  100 mg Oral BID  . enoxaparin (LOVENOX) injection  40 mg Subcutaneous Q24H  . feeding supplement (ENSURE COMPLETE)  237 mL Oral Q1500  . feeding supplement (GLUCERNA SHAKE)  237 mL Oral BID BM  . FLUoxetine  40 mg Oral QAC breakfast  . insulin aspart  0-9 Units Subcutaneous TID WC  . levothyroxine  300 mcg Oral QAC breakfast  . [START ON 07/18/2014] losartan  12.5 mg Oral Daily  . methocarbamol  750 mg Oral QID  . OxyCODONE  15 mg Oral Q12H  . pantoprazole  40 mg Oral Daily  . pregabalin  200 mg Oral BID  . sodium chloride  10-40 mL Intracatheter Q12H  . spironolactone  12.5 mg Oral Daily    Continuous Infusions:    Time spent: 25 minutes  [facility] Pager [phone]. If 7PM-7AM, please contact night-coverage at [email or web address], password [facility] 07/17/2014, 4:07 PM  LOS: 6 days

## 2014-07-18 DIAGNOSIS — I420 Dilated cardiomyopathy: Secondary | ICD-10-CM

## 2014-07-18 LAB — BASIC METABOLIC PANEL
ANION GAP: 5 (ref 5–15)
BUN: 11 mg/dL (ref 6–23)
CALCIUM: 7.9 mg/dL — AB (ref 8.4–10.5)
CHLORIDE: 107 mmol/L (ref 96–112)
CO2: 27 mmol/L (ref 19–32)
CREATININE: 0.93 mg/dL (ref 0.50–1.10)
GFR, EST AFRICAN AMERICAN: 76 mL/min — AB (ref >90)
GFR, EST NON AFRICAN AMERICAN: 66 mL/min — AB (ref >90)
Glucose, Bld: 95 mg/dL (ref 70–99)
Potassium: 4 mmol/L (ref 3.5–5.1)
SODIUM: 139 mmol/L (ref 135–145)

## 2014-07-18 LAB — GLUCOSE, CAPILLARY
GLUCOSE-CAPILLARY: 116 mg/dL — AB (ref 70–99)
Glucose-Capillary: 105 mg/dL — ABNORMAL HIGH (ref 70–99)

## 2014-07-18 MED ORDER — SPIRONOLACTONE 25 MG PO TABS: 12.5000 mg | ORAL_TABLET | Freq: Every day | ORAL | Status: DC

## 2014-07-18 MED ORDER — NITROGLYCERIN 0.4 MG SL SUBL: 0.4000 mg | SUBLINGUAL_TABLET | SUBLINGUAL | Status: DC | PRN

## 2014-07-18 MED ORDER — TEMAZEPAM 15 MG PO CAPS: ORAL_CAPSULE | ORAL | Status: DC

## 2014-07-18 MED ORDER — OXYCODONE HCL ER 15 MG PO T12A: 15.0000 mg | EXTENDED_RELEASE_TABLET | Freq: Two times a day (BID) | ORAL | Status: DC

## 2014-07-18 MED ORDER — LOSARTAN POTASSIUM 25 MG PO TABS: 12.5000 mg | ORAL_TABLET | Freq: Every day | ORAL | Status: DC

## 2014-07-18 MED ORDER — PREGABALIN 200 MG PO CAPS
200.0000 mg | ORAL_CAPSULE | Freq: Two times a day (BID) | ORAL | Status: DC
Start: 2014-07-18 — End: 2015-01-21

## 2014-07-18 MED ORDER — ENSURE COMPLETE PO LIQD: 237.0000 mL | Freq: Every day | ORAL | Status: DC

## 2014-07-18 MED ORDER — CARVEDILOL 3.125 MG PO TABS: 3.1250 mg | ORAL_TABLET | Freq: Two times a day (BID) | ORAL | Status: DC

## 2014-07-18 NOTE — Discharge Summary (Signed)
Discharge Summary  Kerri Vasquez ULA:[id number] DOB: [birth year]  PCP: Medical issues managed by pain clinic, her oncologist Dr. Burney Gauze Admit date: 07/10/2014 Discharge date: 07/18/2014  Time spent: 35 minutes  Recommendations for Outpatient Follow-up:  1. Patient will follow up with the CHF cardiology clinic this week. They will call to set up this appointment 2. Patient will follow-up with interventional radiology in 2-4 weeks. They will call to set up this appointment. 3. Patient be discharged with home health PT and OT plus RN 4. New medication: Coreg 3.125 by mouth twice a day 5. New medication: Cozaar 12.5 by mouth daily 6. New medication: Spironolactone 12.5 by mouth daily 7. The following medications are being discontinued: Percocet, K Dur, Hyzaar, Ritalin, Lasix 8. Medication adjustment: Oxy extended release being decreased from 20 mg to 15 mg by mouth every 12 hours 9. Medication adjustment: Neurontin 200 mg being changed from 3 times a day to twice a day 10. Medication adjustment: Patient's chemotherapy drugs,ibrance and faslodex are on hold until patient follows up with her oncologist at the end of this month  Discharge Diagnoses:  Active Hospital Problems   Diagnosis Date Noted  . DNR (do not resuscitate) discussion 07/15/2014  . Weakness generalized 07/15/2014  . Palliative care encounter 07/15/2014  . Chronic pain syndrome 07/15/2014  . Acute systolic heart failure 21/22/4825  . Dyspnea   . Congestive dilated cardiomyopathy   . Shock circulatory 07/13/2014  . Encephalopathy acute 07/13/2014  . Pleural effusion 07/11/2014  . DM2 (diabetes mellitus, type 2) 07/11/2014  . Essential hypertension 07/11/2014  . NSTEMI (non-ST elevated myocardial infarction) 07/11/2014  . SOB (shortness of breath) 07/11/2014  . Chest pain   . Other specified hypotension   . Breast cancer metastasized to liver     Resolved Hospital Problems   Diagnosis Date Noted Date  Resolved  . Aspiration pneumonia 07/11/2014 07/11/2014    Discharge Condition: Improved, being discharged home  Diet recommendation: Carb modified, heart healthy  Filed Weights   07/16/14 0522 07/17/14 0531 07/18/14 0546  Weight: 106.913 kg (235 lb 11.2 oz) 107.049 kg (236 lb) 105.4 kg (232 lb 5.8 oz)    History of present illness:  Patient is a 59 year old female with past mental history of gastric bypass treating severe morbid obesity as well as breast cancer with recurrent metastasis not to the liver, originally treated with ablation and now patient was admitted to [facility] long hospital on 3/11 for ablation again. Post procedure patient awoke with significant nausea and right upper quadrant abdominal pain as well as substernal chest discomfort. She had several episodes of vomiting and found to have pleural effusions consistent with heart failure as well as an elevated troponin level of 16 and acute renal failure. Patient transferred to [facility] ICU under the critical care service, cardiology consulted and an echocardiogram done 3/11 noted significantly depressed ejection fraction of 15-20 percent. She initially required pressor support. Patient felt to have circulatory shock and a non-STEMI  Hospital Course:  She remained in ICU. She became unresponsive in the context of her chronic narcotic and other sedating medications. These medications were discontinued. 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 Patient felt to be medically stable and transferred to the floor on 3/15 with hospitalist assuming care on 3/16. Patient also seen by palliative care to after extensive discussion, patient decided to become DO NOT RESUSCITATE. Palliative care made adjustments  after restarting her chronic pain medications.   Active Problems:   Breast cancer metastasized to  liver: Patient will follow-up with oncology at the end of the month. They communicated that her chemotherapy drugs be on hold until they've had a chance to reevaluate. His medications are not felt to be playing a role in her cardiomyopathy. Patient will follow up with interventional radiology to 4 weeks.    DM2 (diabetes mellitus, type 2): Stable during this hospitalization. 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 Patient aggressively diuresed and diuresed almost 4 L of fluid. Follow-up echocardiogram confirms persistent heart failure. Patient started on losartan after beta blocker added. Discharge medications include Coreg, losartan and spironolactone.  Essential hypertension: With diagnosis of this cardio myopathy, patient's medications change to beta blocker and losartan plus spironolactone. Patient intolerant of high-dose of losartan and dose decreased to 12.5. Cardiology will adjust up as accordingly.  ? Hyperthyroidism: Markedly suppressed TSH. Free T4 normal. Free T3 low. Recommend no change in Synthroid and rechecking of labs in 6-12 weeks.    Weakness generalized: Evaluated by physical and occupational therapy, with home health    Chronic pain syndrome: Appreciate pot of care help. Patient is able to restart on home medication. She did express some significant nausea and dizziness, unclear if this was from hypotension from high-dose losartan versus pain medications. Extended release oxy Contin decreased from 20 mg to 15 mg every 12 hours. Over time, patient has persistent pain, just dose may be increased back to 20 and can be managed by her pain physicians   Consultants:  Palliative care  Cardiology  Critical care  Interventional radiology  Procedures:  Echocardiogram done 3/11: Ejection  fraction 15-20%  Status post ablation of liver lesion done 3/11  Discharge Exam: BP 92/58 mmHg  Pulse 98  Temp(Src) 97.4 F (36.3 C) (Oral)  Resp 20  Ht 5\' 6"  (1.676 m)  Wt 105.4 kg (232 lb 5.8 oz)  BMI 37.52 kg/m2  SpO2 99%  LMP 08/30/2006  General: Alert and oriented 3, no acute distress Cardiovascular: Regular rate and rhythm, A4-Z6, 3/6 systolic ejection murmur Respiratory: Clear to auscultation bilaterally  Discharge Instructions You were cared for by a hospitalist during your hospital stay. If you have any questions about your discharge medications or the care you received while you were in the hospital after you are discharged, you can call the unit and asked to speak with the hospitalist on call if the hospitalist that took care of you is not available. Once you are discharged, your primary care physician will handle any further medical issues. Please note that NO REFILLS for any discharge medications will be authorized once you are discharged, as it is imperative that you return to your primary care physician (or establish a relationship with a primary care physician if you do not have one) for your aftercare needs so that they can reassess your need for medications and monitor your lab values.  Discharge Instructions    Diet - low sodium heart healthy    Complete by:  As directed      Increase activity slowly    Complete by:  As directed             Medication List    STOP taking these medications        fulvestrant  250 MG/5ML injection  Commonly known as:  FASLODEX     furosemide 20 MG tablet  Commonly known as:  LASIX     losartan-hydrochlorothiazide 100-25 MG per tablet  Commonly known as:  HYZAAR     methylphenidate 5 MG tablet  Commonly known as:  RITALIN     oxyCODONE-acetaminophen 10-325 MG per tablet  Commonly known as:  PERCOCET     palbociclib 100 MG capsule  Commonly known as:  IBRANCE     potassium chloride 10 MEQ tablet  Commonly known  as:  K-DUR      TAKE these medications        acetaminophen 500 MG tablet  Commonly known as:  TYLENOL  Take 1,000 mg by mouth every 4 (four) hours as needed for moderate pain.     carvedilol 3.125 MG tablet  Commonly known as:  COREG  Take 1 tablet (3.125 mg total) by mouth 2 (two) times daily with a meal.     feeding supplement (ENSURE COMPLETE) Liqd  Take 237 mLs by mouth daily at 3 pm.     FLUoxetine 40 MG capsule  Commonly known as:  PROZAC  Take 40 mg by mouth daily before breakfast.     levothyroxine 300 MCG tablet  Commonly known as:  SYNTHROID, LEVOTHROID  Take 300 mcg by mouth daily before breakfast.     losartan 25 MG tablet  Commonly known as:  COZAAR  Take 0.5 tablets (12.5 mg total) by mouth daily.     methocarbamol 750 MG tablet  Commonly known as:  ROBAXIN  Take 750 mg by mouth 4 (four) times daily.     nitroGLYCERIN 0.4 MG SL tablet  Commonly known as:  NITROSTAT  Place 1 tablet (0.4 mg total) under the tongue every 5 (five) minutes as needed for chest pain.     oxyCODONE 5 MG immediate release tablet  Commonly known as:  Oxy IR/ROXICODONE  Take 5 mg by mouth every 4 (four) hours as needed (For pain.).     OxyCODONE 15 mg T12a 12 hr tablet  Commonly known as:  OXYCONTIN  Take 1 tablet (15 mg total) by mouth every 12 (twelve) hours.     pregabalin 200 MG capsule  Commonly known as:  LYRICA  Take 1 capsule (200 mg total) by mouth 2 (two) times daily.     promethazine 25 MG tablet  Commonly known as:  PHENERGAN  Take 1 tablet (25 mg total) by mouth every 8 (eight) hours as needed for nausea or vomiting.     spironolactone 25 MG tablet  Commonly known as:  ALDACTONE  Take 0.5 tablets (12.5 mg total) by mouth daily.     temazepam 15 MG capsule  Commonly known as:  RESTORIL  15 mg po qhs prn for sleep.  If needed, can take an additional 15mg .     tiZANidine 4 MG tablet  Commonly known as:  ZANAFLEX  Take 4 mg by mouth every 6 (six) hours as  needed for muscle spasms.       Allergies  Allergen Reactions  . Influenza Vac Split [Flu Virus Vaccine] Other (See Comments)    Joint stiffness and renal failure  . Ritalin [Methylphenidate Hcl]     "Heart attack"  . Zostavax [Zoster Vaccine Live ([employer])] Other (See Comments)    Joint stiffness, renal failure  . Adhesive [Tape] Itching and Rash       Follow-up Information    Follow up with [facility].   Why:  HH-RN/PT/OT arranged   Contact information:   [address] [place] [address] [phone]       Follow up with [facility].   Why:  wheelchair arranged- to be delivered to room prior to discharge   Contact information:   [address] [place] [address] [phone]       Follow up with Glori Bickers, MD In 1 week.   Specialty:  Cardiology   Why:  we will arrange and contact you.   Contact information:   [place] [address] [phone]       Follow up with [facility], Wynona Luna, MD.   Specialty:  Interventional Radiology   Why:  They will call you to schedule a follow up appt in 2-4 weeks.  Call them if you haven't heard from them by Wed 3/23   Contact information:   [place]  [address] [phone]        The results of significant diagnostics from this hospitalization (including imaging, microbiology, ancillary and laboratory) are listed below for reference.    Significant Diagnostic Studies: Ct Abdomen Wo Contrast  August 09, 2014    IMPRESSION: 1. No large acute hematoma identified to explain the patient's symptoms. There is a small amount of perihepatic fluid which appears low in attenuation. No complications at the ablation site. 2. Bilateral pleural effusions and interstitial edema consistent with CHF. 3. Bilateral lower lobe airspace opacities which may represent areas of alveolar edema secondary to CHF. Pneumonia not  excluded. 4. Ventral abdominal wall hernia which contained loops of small and large bowel. Do small bowel loops within the hernia arm mildly increased in caliber measuring up to 2.6 cm.   Electronically Signed   By: Kerby Moors M.D.   On: 08-09-14 11:09   Ct Guide Tissue Ablation  07/10/2014     IMPRESSION:  1. Technically successful core biopsy of solitary hepatic segment 4A lesion. 2. Technically successful CT guided microwave ablation of recurrent solitary hepatic metastasis.   Electronically Signed   By: Lucrezia Europe M.D.   On: 07/10/2014 16:48   Dg Chest Port 1 View  07/13/2014    IMPRESSION: New right-sided central line. No pneumothorax. Worsening airspace opacities.   Electronically Signed   By: Andreas Newport M.D.   On: 07/13/2014 04:05   Dg Chest Port 1 View  08/09/14     IMPRESSION: 1. The appearance the chest suggests bronchitis with multilobar bronchopneumonia in the left upper and lower lobes. Repeat standing PA and lateral chest radiographs are recommended within 2-3 weeks following trial of antimicrobial therapy to ensure resolution of these findings.   Electronically Signed   By: Vinnie Langton M.D.   On: 2014-08-09 14:30    Microbiology: Recent Results (from the past 240 hour(s))  Culture, blood (routine x 2)     Status: None   Collection Time: 2014-08-09  1:35 PM  Result Value Ref Range Status   Specimen Description BLOOD RIGHT HAND  Final   Special Requests BOTTLES DRAWN AEROBIC ONLY 10CC  Final   Culture   Final    NO GROWTH 5 DAYS Performed at [facility]    Report Status 07/17/2014 FINAL  Final  Culture, blood (routine x 2)     Status: None   Collection Time: 2014-08-09  1:40 PM  Result Value Ref Range Status   Specimen Description BLOOD  RIGHT HAND  Final   Special Requests BOTTLES DRAWN AEROBIC ONLY 10CC  Final   Culture   Final    NO GROWTH 5 DAYS Performed at [facility]    Report Status 07/17/2014 FINAL  Final  MRSA PCR Screening      Status: None   Collection Time: 07/11/14  5:55 PM  Result Value Ref Range Status   MRSA by PCR NEGATIVE NEGATIVE Final    Comment:        The GeneXpert MRSA Assay (FDA approved for NASAL specimens only), is one component of a comprehensive MRSA colonization surveillance program. It is not intended to diagnose MRSA infection nor to guide or monitor treatment for MRSA infections. Performed at [facility]   Culture, Urine     Status: None   Collection Time: 07/11/14  6:54 PM  Result Value Ref Range Status   Specimen Description URINE, CLEAN CATCH  Final   Special Requests NONE  Final   Colony Count NO GROWTH Performed at [facility]   Final   Culture NO GROWTH Performed at [facility]   Final   Report Status 07/13/2014 FINAL  Final     Labs: Basic Metabolic Panel:  Recent Labs Lab 07/13/14 0345 07/14/14 0455 07/14/14 0813 07/14/14 0930 07/15/14 0615 07/16/14 0544 07/16/14 0614 07/17/14 0457 07/18/14 0354  NA 134* 139 140  --  138  --  138  --  139  K 4.1 2.9* 3.0*  --  3.4*  --  4.5  --  4.0  CL 100 102 101  --  104  --  103  --  107  CO2 29 32  --   --  28  --  26  --  27  GLUCOSE 145* 114* 124*  --  129*  --  135*  --  95  BUN 33* 25* 25*  --  21  --  17  --  11  CREATININE 1.56* 1.13* 1.00 1.06 1.09  --  0.94  --  0.93  CALCIUM 7.3* 7.7*  --   --  7.7*  --  8.5  --  7.9*  MG  --  2.3  --   --  2.4 2.2  --  2.2  --   PHOS  --  1.5*  --   --  1.5* 1.8*  --  1.8*  --    Liver Function Tests:  Recent Labs Lab 07/12/14 0705 07/13/14 0345  AST 100* 70*  ALT 51* 39*  ALKPHOS 108 96  BILITOT 1.9* 2.5*  PROT 6.8 5.7*  ALBUMIN 3.0* 2.6*   No results for input(s): LIPASE, AMYLASE in the last 168 hours. No results for input(s): AMMONIA in the last 168 hours. CBC:  Recent Labs Lab 07/12/14 0705 07/13/14 0345 07/14/14 0455 07/14/14 0813 07/14/14 0930 07/17/14 0457  WBC 12.4* 14.1* 7.9  --  7.4 6.3  NEUTROABS 11.1*  --   --    --   --   --   HGB 12.4 11.2* 10.9* 12.2 11.5* 10.1*  HCT 36.4 33.5* 32.9* 36.0 34.3* 30.8*  MCV 95.0 96.5 96.2  --  96.3 100.0  PLT 237 197 195  --  177 162   Cardiac Enzymes:  Recent Labs Lab 07/11/14 1842 07/12/14 0705  CKTOTAL  --  687*  CKMB  --  24.4*  TROPONINI 18.76* 9.43*   BNP: BNP (last 3 results)  Recent Labs  07/11/14 1340  BNP 3001.6*  ProBNP (last 3 results) No results for input(s): PROBNP in the last 8760 hours.  CBG:  Recent Labs Lab 07/17/14 1204 07/17/14 1653 07/17/14 2144 07/18/14 0644 07/18/14 1125  GLUCAP 112* 97 103* 105* 116*       Signed:  Jamaurie Bernier K  [facility] 07/18/2014, 3:22 PM

## 2014-07-18 NOTE — Progress Notes (Signed)
07/18/2014 3:41 PM Discharge AVS meds taken today and those due this evening reviewed.  Follow-up appointments and when to call md reviewed.  D/C IV and TELE.  Questions and concerns addressed.   D/C home per orders. [place]

## 2014-07-18 NOTE — Progress Notes (Signed)
Please see complete note with recommendations from Dr. Haroldine Laws last night. Our team will make arrangements for follow-up in the heart failure clinic next week.  Daryel November, MD

## 2014-07-20 ENCOUNTER — Other Ambulatory Visit: Payer: Self-pay | Admitting: *Deleted

## 2014-07-20 DIAGNOSIS — C50919 Malignant neoplasm of unspecified site of unspecified female breast: Secondary | ICD-10-CM

## 2014-07-20 DIAGNOSIS — C787 Secondary malignant neoplasm of liver and intrahepatic bile duct: Principal | ICD-10-CM

## 2014-07-20 MED ORDER — ONDANSETRON HCL 8 MG PO TABS: 8.0000 mg | ORAL_TABLET | Freq: Three times a day (TID) | ORAL | Status: DC | PRN

## 2014-07-20 MED ORDER — LORAZEPAM 0.5 MG PO TABS: 0.5000 mg | ORAL_TABLET | Freq: Four times a day (QID) | ORAL | Status: DC | PRN

## 2014-07-20 NOTE — Telephone Encounter (Signed)
Patient discharged Saturday. Having continued problems with nausea and vomiting. Unresponsive to phenergan.   Spoke to Dr Marin Olp. Will call in Zofran and Ativan for patient. Spoke to daughter who is in agreement to plan

## 2014-07-21 DIAGNOSIS — D510 Vitamin B12 deficiency anemia due to intrinsic factor deficiency: Secondary | ICD-10-CM | POA: Diagnosis not present

## 2014-07-21 DIAGNOSIS — Z9013 Acquired absence of bilateral breasts and nipples: Secondary | ICD-10-CM | POA: Diagnosis not present

## 2014-07-21 DIAGNOSIS — I129 Hypertensive chronic kidney disease with stage 1 through stage 4 chronic kidney disease, or unspecified chronic kidney disease: Secondary | ICD-10-CM | POA: Diagnosis not present

## 2014-07-21 DIAGNOSIS — C50911 Malignant neoplasm of unspecified site of right female breast: Secondary | ICD-10-CM | POA: Diagnosis not present

## 2014-07-21 DIAGNOSIS — M797 Fibromyalgia: Secondary | ICD-10-CM | POA: Diagnosis not present

## 2014-07-21 DIAGNOSIS — C787 Secondary malignant neoplasm of liver and intrahepatic bile duct: Secondary | ICD-10-CM | POA: Diagnosis not present

## 2014-07-21 DIAGNOSIS — E119 Type 2 diabetes mellitus without complications: Secondary | ICD-10-CM | POA: Diagnosis not present

## 2014-07-21 DIAGNOSIS — N189 Chronic kidney disease, unspecified: Secondary | ICD-10-CM | POA: Diagnosis not present

## 2014-07-24 ENCOUNTER — Telehealth: Payer: Self-pay | Admitting: *Deleted

## 2014-07-24 NOTE — Telephone Encounter (Signed)
Home Health RN stating that patient is c/o gas pains. Relayed info to Dr Marin Olp. Order received for patient to try OTC Pepto Bismol and relayed to patient's nurse.

## 2014-07-28 ENCOUNTER — Ambulatory Visit: Payer: Self-pay | Admitting: Hematology & Oncology

## 2014-07-28 ENCOUNTER — Inpatient Hospital Stay: Payer: Self-pay

## 2014-07-28 ENCOUNTER — Other Ambulatory Visit: Payer: Self-pay | Admitting: Lab

## 2014-07-29 ENCOUNTER — Other Ambulatory Visit: Payer: Self-pay

## 2014-07-29 ENCOUNTER — Ambulatory Visit: Payer: Self-pay | Admitting: Hematology & Oncology

## 2014-07-29 ENCOUNTER — Inpatient Hospital Stay: Payer: Self-pay

## 2014-08-03 ENCOUNTER — Other Ambulatory Visit: Payer: Self-pay | Admitting: Hematology & Oncology

## 2014-08-03 ENCOUNTER — Inpatient Hospital Stay (HOSPITAL_COMMUNITY): Payer: Self-pay

## 2014-08-10 ENCOUNTER — Ambulatory Visit: Payer: Medicare Other | Admitting: Physical Therapy

## 2014-08-13 ENCOUNTER — Encounter (HOSPITAL_COMMUNITY): Payer: Self-pay

## 2014-08-13 ENCOUNTER — Ambulatory Visit (HOSPITAL_COMMUNITY)
Admission: RE | Admit: 2014-08-13 | Discharge: 2014-08-13 | Disposition: A | Discharge status: Home / Self Care | Payer: Medicare Other | Source: Ambulatory Visit | Attending: Internal Medicine | Admitting: Internal Medicine

## 2014-08-13 VITALS — BP 112/66 | HR 74 | Wt 243.8 lb

## 2014-08-13 DIAGNOSIS — R531 Weakness: Secondary | ICD-10-CM | POA: Diagnosis not present

## 2014-08-13 DIAGNOSIS — C50919 Malignant neoplasm of unspecified site of unspecified female breast: Secondary | ICD-10-CM | POA: Insufficient documentation

## 2014-08-13 DIAGNOSIS — I5022 Chronic systolic (congestive) heart failure: Secondary | ICD-10-CM | POA: Diagnosis not present

## 2014-08-13 MED ORDER — POTASSIUM CHLORIDE CRYS ER 20 MEQ PO TBCR: 20.0000 meq | EXTENDED_RELEASE_TABLET | Freq: Every day | ORAL | Status: DC | PRN

## 2014-08-13 MED ORDER — FUROSEMIDE 20 MG PO TABS: 20.0000 mg | ORAL_TABLET | Freq: Every day | ORAL | Status: DC | PRN

## 2014-08-13 MED ORDER — SPIRONOLACTONE 25 MG PO TABS: 25.0000 mg | ORAL_TABLET | Freq: Every day | ORAL | Status: DC

## 2014-08-13 NOTE — Patient Instructions (Signed)
INCREASE Spironolactone to 25 mg (1 tablet) once daily.  START Lasix 20 mg (1 tablet) as needed for weight gain of 3 or more lbs overnight.  START Potassium 20 meq (1 tablet) as needed WITH LASIX.  Follow up in 4 weeks with an echocardiogram.  Do the following things EVERYDAY: 1) Weigh yourself in the morning before breakfast. Write it down and keep it in a log. 2) Take your medicines as prescribed 3) Eat low salt foods-Limit salt (sodium) to 2000 mg per day.  4) Stay as active as you can everyday 5) Limit all fluids for the day to less than 2 liters

## 2014-08-13 NOTE — Progress Notes (Signed)
Patient ID: Kerri Vasquez, female   DOB: 1956-03-20, 59 y.o.   MRN: [id number] PCP: Primary Cardiologist:Dr Benismhon Oncologist: Dr Marin Olp  HPI: Kerri Vasquez is 59 y/o woman with obesity s/p gastric bypass, HTN and breast CA. Underwent chemo and bilateral mastectomies in 2008. It sounds like her chemotherapy was Adriamycin/Cytoxan followed by Taxotere. She was then put on tamoxifen for year and then Femara. She had a previous liver lesion that was ablated. Unfortunately found to have recurrent liver metastasis and underwent IR ablation in 3/16. Procedure c/b acute CP and dyspnea with hypotension. Trop peaked at 17. ECHO with EF 20-25% with RWMA. . Cath on 07/14/14 with no significant CAD. LVEDP 10. Echo in 2011 showed normal EF.   Prior to discharge struggled with hypotension and required pressors. Could only tolerate low-dose HF meds.   She returns for post hospital follow up. Complaining of increased fatigue and mild LE edema. No orthopnea or PND. Having leg and back pain. Has ongoing lymphedema RUE. Appetite fair. Having diarrhea. Weight at home 238  Taking all medications except losartan - which PCP stopped due to severe hypotension.    ECHO 06/2014: EF 20-25%   ROS: All systems negative except as listed in HPI, PMH and Problem List.  SH:  History   Social History  . Marital Status: Divorced    Spouse Name: N/A  . Number of Children: 1  . Years of Education: N/A   Occupational History  . Not on file.   Social History Main Topics  . Smoking status: Never Smoker   . Smokeless tobacco: Never Used     Comment: never used tobacco  . Alcohol Use: No  . Drug Use: No  . Sexual Activity: Not on file   Other Topics Concern  . Not on file   Social History Narrative   Caffeine use: 3 cups coffee/tea daily   Regular exercise: no          FH:  Family History  Problem Relation Age of Onset  . Cancer Mother     breast  . Hypertension Mother   . Anesthesia problems Daughter      Past Medical History  Diagnosis Date  . Depression   . Hypertension   . Migraine   . Hypothyroidism   . Incisional hernia   . Spinal stenosis   . Scoliosis   . Sciatica   . Intestinal obstruction   . GERD (gastroesophageal reflux disease)   . Septic arthritis 01/28/10    and spinal stenosis , moderate scoliosis   . Osteoarthritis of multiple joints   . Anemia   . Pernicious anemia 05/30/2011    Iron transfusion and VB12 on 06/06/11  . Sleep apnea     hx of off machine x 4 years   . Diabetes mellitus     type II, controlled by diet 05/25/11   . Renal insufficiency     hx renal failure 2011  . Fibromyalgia   . Fracture of multiple toes     right toes x 4 toes- excluding small toe    Current Outpatient Prescriptions  Medication Sig Dispense Refill  . acetaminophen (TYLENOL) 500 MG tablet Take 1,000 mg by mouth every 4 (four) hours as needed for moderate pain.    . carvedilol (COREG) 3.125 MG tablet Take 1 tablet (3.125 mg total) by mouth 2 (two) times daily with a meal. 60 tablet 1  . FLUoxetine (PROZAC) 40 MG capsule Take 40 mg by mouth daily before  breakfast.     . levothyroxine (SYNTHROID, LEVOTHROID) 300 MCG tablet TAKE 1 TABLET BY MOUTH DAILY BEFORE BREAKFAST 30 tablet 0  . LORazepam (ATIVAN) 0.5 MG tablet Place 1 tablet (0.5 mg total) under the tongue every 6 (six) hours as needed (Nausea). 60 tablet 0  . methocarbamol (ROBAXIN) 750 MG tablet Take 750 mg by mouth 4 (four) times daily.    . nitroGLYCERIN (NITROSTAT) 0.4 MG SL tablet Place 1 tablet (0.4 mg total) under the tongue every 5 (five) minutes as needed for chest pain. 30 tablet 12  . ondansetron (ZOFRAN) 8 MG tablet Take 1 tablet (8 mg total) by mouth every 8 (eight) hours as needed for nausea or vomiting. 60 tablet 2  . oxyCODONE (OXY IR/ROXICODONE) 5 MG immediate release tablet Take 5 mg by mouth every 4 (four) hours as needed (For pain.).    Marland Kitchen OxyCODONE (OXYCONTIN) 20 mg T12A 12 hr tablet Take 20 mg by mouth  every 12 (twelve) hours.    . promethazine (PHENERGAN) 25 MG tablet TAKE 1 TABLET BY MOUTH THREE TIMES DAILY AS NEEDED FOR NAUSEA 90 tablet 0  . spironolactone (ALDACTONE) 25 MG tablet Take 0.5 tablets (12.5 mg total) by mouth daily. 30 tablet 1  . temazepam (RESTORIL) 15 MG capsule 15 mg po qhs prn for sleep.  If needed, can take an additional 15mg . 50 capsule 0  . tiZANidine (ZANAFLEX) 4 MG tablet Take 4 mg by mouth every 6 (six) hours as needed for muscle spasms.    . feeding supplement, ENSURE COMPLETE, (ENSURE COMPLETE) LIQD Take 237 mLs by mouth daily at 3 pm. (Patient not taking: Reported on 08/13/2014) 30 Bottle 5  . pregabalin (LYRICA) 200 MG capsule Take 1 capsule (200 mg total) by mouth 2 (two) times daily. (Patient not taking: Reported on 08/13/2014) 60 capsule 1   No current facility-administered medications for this encounter.    Filed Vitals:   08/13/14 1435  BP: 112/66  Pulse: 74  Weight: 243 lb 12 oz (110.564 kg)  SpO2: 98%    PHYSICAL EXAM:  General:  Obese woman sitting in chair. No resp difficulty HEENT: normal Neck: supple. JVP flat. Carotids 2+ bilaterally; no bruits. No lymphadenopathy or thryomegaly appreciated. Cor: PMI normal. Regular rate & rhythm. No rubs, gallops or murmurs. Lungs: clear Abdomen: soft, obese nontender, nondistended. No hepatosplenomegaly. No bruits or masses. Good bowel sounds. Extremities: no cyanosis, clubbing, rash, RLE 2+ LLE 1-2+ edema Neuro: alert & orientedx3, cranial nerves grossly intact. Moves all 4 extremities w/o difficulty. Affect pleasant   ASSESSMENT & PLAN:  1. Chronic systolic HF. Echo 06/2014 EF 20-25%. Suspect tako-tsubo. Cath 3/16. No CAD - NYHA III. Mild volume overload. BP soft. - Continue carvedilol 3.125 mg twice a day - Increase spiro to 25 mg daily - Keep off losartan due to hypotension - Use lasix 20 mg daily PRN as needed for volume overload.  - Check echo at next visit. 2. Metastatic breast cancer s/p IR  ablation of liver metastasis this admission 3. Morbid obesity 4. Deconditioning - continue HHPT   Daniel Bensimhon,MD 11:03 PM

## 2014-08-18 ENCOUNTER — Ambulatory Visit
Admit: 2014-08-18 | Discharge: 2014-08-18 | Disposition: A | Discharge status: Home / Self Care | Payer: Medicare Other | Attending: Radiology | Admitting: Radiology

## 2014-08-18 DIAGNOSIS — C787 Secondary malignant neoplasm of liver and intrahepatic bile duct: Secondary | ICD-10-CM | POA: Diagnosis not present

## 2014-08-18 DIAGNOSIS — C50912 Malignant neoplasm of unspecified site of left female breast: Secondary | ICD-10-CM

## 2014-08-18 NOTE — Consult Note (Signed)
Chief Complaint: Chief Complaint  Patient presents with  . Follow-up    5 wk follow up [place] [facility]    Referring Physician(s): Allred,Darrell K  History of Present Illness: Kerri Vasquez is a 59 y.o. female   with a history of right breast carcinoma approximately 8years ago and underwent bilateral mastectomy at that time. On a subsequent abdominal CT scan to evaluate abdominal pain and a ventral hernia, a new liver lesion was identified, confirmed on ultrasound-guided FNA biopsy 11/27/11 to represent metastatic breast carcinoma. PET CT showed no other metastatic disease. She had biopsy-proven metastatic disease to solitary lesion in the liver, and underwent RF ablation on 01/26/2012. Follow-up surveillance imaging had remained negative until a follow-up PET CT, which demonstrated new hypermetabolic solitary liver lesion. This was in a similar location to the initial lesion and was treated with repeat percutaneous microwave ablation on 07/10/2014. Biopsy obtained concurrently was positive for metastatic breast carcinoma. Her post procedure hospital course was complicated by periprocedural MI,. She returns now as outpatient for routine follow-up.  The right upper abdominal postprocedural pain has largely resolved. She has some baseline low back pain which persists. She feels like her energy level is low after the MI. No shortness of breath however. No new nausea or vomiting. Past Medical History  Diagnosis Date  . Depression   . Hypertension   . Migraine   . Hypothyroidism   . Incisional hernia   . Spinal stenosis   . Scoliosis   . Sciatica   . Intestinal obstruction   . GERD (gastroesophageal reflux disease)   . Septic arthritis 01/28/10    and spinal stenosis , moderate scoliosis   . Osteoarthritis of multiple joints   . Anemia   . Pernicious anemia 05/30/2011    Iron transfusion and VB12 on 06/06/11  . Sleep apnea     hx of off machine x 4 years   . Diabetes mellitus    type II, controlled by diet 05/25/11   . Renal insufficiency     hx renal failure 2011  . Fibromyalgia   . Fracture of multiple toes     right toes x 4 toes- excluding small toe    Past Surgical History  Procedure Laterality Date  . Tonsillectomy  1969  . Multiple extractions with alveoloplasty  06/08/2011    Procedure: MULTIPLE EXTRACION WITH ALVEOLOPLASTY;  Surgeon: Lenn Cal, DDS;  Location: [facility];  Service: Oral Surgery;  Laterality: N/A;  Extraction of tooth #'s 3,4,7 with alveoloplasty and Gross Debridement of Remaining Teeth  . Gastric bypass  2006  . Intestinal blockage  2011    surgery for this   . Breast surgery  2008     bilateral mastectomy  . Breast reconstruction  2008, 2009  . Cholecystectomy  06/29/85  . Radio frequency ablation to liver  sept 27, 2013  . Thyroid lobectomy  02/16/2012    Procedure: THYROID LOBECTOMY;  Surgeon: Earnstine Regal, MD;  Location: [facility];  Service: General;  Laterality: Right;  . Left heart catheterization with coronary angiogram N/A 07/14/2014    Procedure: LEFT HEART CATHETERIZATION WITH CORONARY ANGIOGRAM;  Surgeon: Leonie Man, MD;  Location: [facility] CATH LAB;  Service: Cardiovascular;  Laterality: N/A;    Allergies: Influenza vac split; Ritalin; Zostavax; and Adhesive  Medications: Prior to Admission medications   Medication Sig Start Date End Date Taking? Authorizing Provider  acetaminophen (TYLENOL) 500 MG tablet Take 1,000 mg by mouth every  4 (four) hours as needed for moderate pain.   Yes Historical Provider, MD  carvedilol (COREG) 3.125 MG tablet Take 1 tablet (3.125 mg total) by mouth 2 (two) times daily with a meal. 07/18/14  Yes Annita Brod, MD  FLUoxetine (PROZAC) 40 MG capsule Take 40 mg by mouth daily before breakfast.    Yes Historical Provider, MD  furosemide (LASIX) 20 MG tablet Take 1 tablet (20 mg total) by mouth daily as needed. 08/13/14  Yes Shaune Pascal Bensimhon, MD  levothyroxine (SYNTHROID, LEVOTHROID) 300 MCG  tablet TAKE 1 TABLET BY MOUTH DAILY BEFORE BREAKFAST 08/03/14  Yes Volanda Napoleon, MD  LORazepam (ATIVAN) 0.5 MG tablet Place 1 tablet (0.5 mg total) under the tongue every 6 (six) hours as needed (Nausea). 07/20/14  Yes Volanda Napoleon, MD  nitroGLYCERIN (NITROSTAT) 0.4 MG SL tablet Place 1 tablet (0.4 mg total) under the tongue every 5 (five) minutes as needed for chest pain. 07/18/14  Yes Annita Brod, MD  ondansetron (ZOFRAN) 8 MG tablet Take 1 tablet (8 mg total) by mouth every 8 (eight) hours as needed for nausea or vomiting. 07/20/14  Yes Volanda Napoleon, MD  oxyCODONE (OXY IR/ROXICODONE) 5 MG immediate release tablet Take 5 mg by mouth every 4 (four) hours as needed (For pain.).   Yes Historical Provider, MD  OxyCODONE (OXYCONTIN) 20 mg T12A 12 hr tablet Take 20 mg by mouth every 12 (twelve) hours.   Yes Historical Provider, MD  potassium chloride SA (KLOR-CON M20) 20 MEQ tablet Take 1 tablet (20 mEq total) by mouth daily as needed. Take ONLY if you take Lasix. 08/13/14  Yes Jolaine Artist, MD  pregabalin (LYRICA) 200 MG capsule Take 1 capsule (200 mg total) by mouth 2 (two) times daily. 07/18/14  Yes Annita Brod, MD  promethazine (PHENERGAN) 25 MG tablet TAKE 1 TABLET BY MOUTH THREE TIMES DAILY AS NEEDED FOR NAUSEA 08/03/14  Yes Volanda Napoleon, MD  spironolactone (ALDACTONE) 25 MG tablet Take 1 tablet (25 mg total) by mouth daily. 08/13/14  Yes Shaune Pascal Bensimhon, MD  temazepam (RESTORIL) 15 MG capsule 15 mg po qhs prn for sleep.  If needed, can take an additional 15mg . 07/18/14  Yes Annita Brod, MD  tiZANidine (ZANAFLEX) 4 MG tablet Take 4 mg by mouth every 6 (six) hours as needed for muscle spasms.   Yes Historical Provider, MD  feeding supplement, ENSURE COMPLETE, (ENSURE COMPLETE) LIQD Take 237 mLs by mouth daily at 3 pm. Patient not taking: Reported on 08/13/2014 07/18/14   Annita Brod, MD  methocarbamol (ROBAXIN) 750 MG tablet Take 750 mg by mouth 4 (four) times daily.     Historical Provider, MD     Family History  Problem Relation Age of Onset  . Cancer Mother     breast  . Hypertension Mother   . Anesthesia problems Daughter     History   Social History  . Marital Status: Divorced    Spouse Name: N/A  . Number of Children: 1  . Years of Education: N/A   Social History Main Topics  . Smoking status: Never Smoker   . Smokeless tobacco: Never Used     Comment: never used tobacco  . Alcohol Use: No  . Drug Use: No  . Sexual Activity: Not on file   Other Topics Concern  . Not on file   Social History Narrative   Caffeine use: 3 cups coffee/tea daily   Regular exercise: no  ECOG Status: 2 - Symptomatic, <50% confined to bed  Review of Systems: A 12 point ROS discussed and pertinent positives are indicated in the HPI above.  All other systems are negative.  Review of Systems  Vital Signs: BP 96/56 mmHg  Pulse 117  Temp(Src) 97.8 F (36.6 C) (Oral)  Resp 14  Ht 5\' 6"  (1.676 m)  Wt 238 lb 9.6 oz (108.228 kg)  BMI 38.53 kg/m2  SpO2 90%  LMP 08/30/2006  Physical Exam  Mallampati Score:     Imaging: No results found.  Labs:  CBC:  Recent Labs  07/13/14 0345 07/14/14 0455 07/14/14 0813 07/14/14 0930 07/17/14 0457  WBC 14.1* 7.9  --  7.4 6.3  HGB 11.2* 10.9* 12.2 11.5* 10.1*  HCT 33.5* 32.9* 36.0 34.3* 30.8*  PLT 197 195  --  177 162    COAGS:  Recent Labs  07/07/14 1005 07/11/14 1842 07/12/14 0705  INR 1.11 1.21 1.20  APTT 36 29  --     BMP:  Recent Labs  07/14/14 0455 07/14/14 0813 07/14/14 0930 07/15/14 0615 07/16/14 0614 07/18/14 0354  NA 139 140  --  138 138 139  K 2.9* 3.0*  --  3.4* 4.5 4.0  CL 102 101  --  104 103 107  CO2 32  --   --  28 26 27   GLUCOSE 114* 124*  --  129* 135* 95  BUN 25* 25*  --  21 17 11   CALCIUM 7.7*  --   --  7.7* 8.5 7.9*  CREATININE 1.13* 1.00 1.06 1.09 0.94 0.93  GFRNONAA 52*  --  56* 54* 65* 66*  GFRAA 60*  --  65* 63* 75* 76*    LIVER  FUNCTION TESTS:  Recent Labs  07/07/14 1005 07/11/14 0440 07/12/14 0705 07/13/14 0345  BILITOT 0.5 1.0 1.9* 2.5*  AST 23 143* 100* 70*  ALT 17 63* 51* 39*  ALKPHOS 89 140* 108 96  PROT 7.1 7.5 6.8 5.7*  ALBUMIN 3.6 3.6 3.0* 2.6*    TUMOR MARKERS: No results for input(s): AFPTM, CEA, CA199, CHROMGRNA in the last 8760 hours.  Assessment and Plan:  My impression is that she's doing well 1 month post repeat percutaneous microwave ablation of solitary liver metastasis from breast carcinoma, despite her periprocedural MI. Her bilirubin is up a little bit, transaminases improved, protein and albumin down. I recommend her first follow-up PET/CT 3-4 months post procedure as surveillance. Consider follow-up LFTs at that time. She is scheduled to see Dr. Marin Olp next week, anticipating a conversation regarding whether or not she should undergo any further chemotherapy treatments. We can coordinate surveillance imaging with his treatment needs.    Thank you for allowing me to participate in care of [facility]. I will follow her imaging surveillance findings.  Signed: Keylin Ferryman III, DAYNE Aavya Shafer 08/18/2014, 2:50 PM   I spent a total of   25 Minutes in face to face in clinical consultation, greater than 50% of which was counseling/coordinating care for metastatic breast carcinoma.

## 2014-08-22 ENCOUNTER — Other Ambulatory Visit (HOSPITAL_COMMUNITY): Payer: Self-pay | Admitting: Internal Medicine

## 2014-08-22 ENCOUNTER — Other Ambulatory Visit: Payer: Self-pay | Admitting: Hematology & Oncology

## 2014-08-24 ENCOUNTER — Ambulatory Visit: Payer: Medicare Other | Admitting: Hematology & Oncology

## 2014-08-24 ENCOUNTER — Encounter: Payer: Self-pay | Admitting: Nurse Practitioner

## 2014-08-24 ENCOUNTER — Other Ambulatory Visit: Payer: Medicare Other

## 2014-08-24 ENCOUNTER — Inpatient Hospital Stay: Payer: Medicare Other

## 2014-08-24 DIAGNOSIS — M25532 Pain in left wrist: Secondary | ICD-10-CM | POA: Diagnosis not present

## 2014-08-24 DIAGNOSIS — G603 Idiopathic progressive neuropathy: Secondary | ICD-10-CM | POA: Diagnosis not present

## 2014-08-24 DIAGNOSIS — G894 Chronic pain syndrome: Secondary | ICD-10-CM | POA: Diagnosis not present

## 2014-08-24 DIAGNOSIS — M4726 Other spondylosis with radiculopathy, lumbar region: Secondary | ICD-10-CM | POA: Diagnosis not present

## 2014-08-24 NOTE — Progress Notes (Signed)
OT/PT in homecare called to inform us that pt and her daughter cancelled the last 3-4  Home visits that were scheduled. Pt will no longer be receiving in home OT/PT as these services are being cancelled due to non-use.

## 2014-08-26 ENCOUNTER — Telehealth (HOSPITAL_COMMUNITY): Payer: Self-pay | Admitting: Vascular Surgery

## 2014-08-26 ENCOUNTER — Observation Stay (HOSPITAL_COMMUNITY)
Admission: EM | Admit: 2014-08-26 | Discharge: 2014-08-28 | Disposition: A | Discharge status: Home / Self Care | Payer: Medicare Other | Attending: Oncology | Admitting: Oncology

## 2014-08-26 ENCOUNTER — Emergency Department (HOSPITAL_COMMUNITY): Payer: Medicare Other

## 2014-08-26 ENCOUNTER — Encounter (HOSPITAL_COMMUNITY): Payer: Self-pay

## 2014-08-26 ENCOUNTER — Ambulatory Visit: Payer: Medicare Other | Attending: Hematology & Oncology | Admitting: Physical Therapy

## 2014-08-26 ENCOUNTER — Telehealth: Payer: Self-pay | Admitting: Physical Therapy

## 2014-08-26 DIAGNOSIS — N179 Acute kidney failure, unspecified: Principal | ICD-10-CM | POA: Diagnosis present

## 2014-08-26 DIAGNOSIS — E039 Hypothyroidism, unspecified: Secondary | ICD-10-CM | POA: Diagnosis not present

## 2014-08-26 DIAGNOSIS — I5022 Chronic systolic (congestive) heart failure: Secondary | ICD-10-CM | POA: Diagnosis present

## 2014-08-26 DIAGNOSIS — E119 Type 2 diabetes mellitus without complications: Secondary | ICD-10-CM | POA: Insufficient documentation

## 2014-08-26 DIAGNOSIS — K219 Gastro-esophageal reflux disease without esophagitis: Secondary | ICD-10-CM | POA: Insufficient documentation

## 2014-08-26 DIAGNOSIS — R609 Edema, unspecified: Secondary | ICD-10-CM

## 2014-08-26 DIAGNOSIS — Z66 Do not resuscitate: Secondary | ICD-10-CM | POA: Insufficient documentation

## 2014-08-26 DIAGNOSIS — F329 Major depressive disorder, single episode, unspecified: Secondary | ICD-10-CM | POA: Diagnosis present

## 2014-08-26 DIAGNOSIS — R0609 Other forms of dyspnea: Secondary | ICD-10-CM

## 2014-08-26 DIAGNOSIS — R197 Diarrhea, unspecified: Secondary | ICD-10-CM | POA: Diagnosis not present

## 2014-08-26 DIAGNOSIS — M1990 Unspecified osteoarthritis, unspecified site: Secondary | ICD-10-CM | POA: Diagnosis not present

## 2014-08-26 DIAGNOSIS — F419 Anxiety disorder, unspecified: Secondary | ICD-10-CM | POA: Insufficient documentation

## 2014-08-26 DIAGNOSIS — D638 Anemia in other chronic diseases classified elsewhere: Secondary | ICD-10-CM | POA: Insufficient documentation

## 2014-08-26 DIAGNOSIS — R0600 Dyspnea, unspecified: Secondary | ICD-10-CM | POA: Diagnosis not present

## 2014-08-26 DIAGNOSIS — I2510 Atherosclerotic heart disease of native coronary artery without angina pectoris: Secondary | ICD-10-CM | POA: Insufficient documentation

## 2014-08-26 DIAGNOSIS — I252 Old myocardial infarction: Secondary | ICD-10-CM | POA: Diagnosis not present

## 2014-08-26 DIAGNOSIS — D649 Anemia, unspecified: Secondary | ICD-10-CM | POA: Diagnosis present

## 2014-08-26 DIAGNOSIS — I10 Essential (primary) hypertension: Secondary | ICD-10-CM | POA: Diagnosis not present

## 2014-08-26 DIAGNOSIS — I5181 Takotsubo syndrome: Secondary | ICD-10-CM | POA: Insufficient documentation

## 2014-08-26 DIAGNOSIS — C785 Secondary malignant neoplasm of large intestine and rectum: Secondary | ICD-10-CM | POA: Insufficient documentation

## 2014-08-26 DIAGNOSIS — G894 Chronic pain syndrome: Secondary | ICD-10-CM | POA: Diagnosis not present

## 2014-08-26 DIAGNOSIS — Z9013 Acquired absence of bilateral breasts and nipples: Secondary | ICD-10-CM | POA: Diagnosis not present

## 2014-08-26 DIAGNOSIS — R0789 Other chest pain: Secondary | ICD-10-CM | POA: Diagnosis not present

## 2014-08-26 DIAGNOSIS — Z853 Personal history of malignant neoplasm of breast: Secondary | ICD-10-CM | POA: Diagnosis not present

## 2014-08-26 DIAGNOSIS — E860 Dehydration: Secondary | ICD-10-CM | POA: Insufficient documentation

## 2014-08-26 DIAGNOSIS — F32A Depression, unspecified: Secondary | ICD-10-CM | POA: Diagnosis present

## 2014-08-26 DIAGNOSIS — R600 Localized edema: Secondary | ICD-10-CM | POA: Insufficient documentation

## 2014-08-26 DIAGNOSIS — Z9884 Bariatric surgery status: Secondary | ICD-10-CM | POA: Insufficient documentation

## 2014-08-26 DIAGNOSIS — C787 Secondary malignant neoplasm of liver and intrahepatic bile duct: Secondary | ICD-10-CM | POA: Insufficient documentation

## 2014-08-26 DIAGNOSIS — J984 Other disorders of lung: Secondary | ICD-10-CM | POA: Diagnosis not present

## 2014-08-26 DIAGNOSIS — E0590 Thyrotoxicosis, unspecified without thyrotoxic crisis or storm: Secondary | ICD-10-CM | POA: Diagnosis present

## 2014-08-26 HISTORY — DX: Malignant neoplasm of liver, not specified as primary or secondary: C22.9

## 2014-08-26 HISTORY — DX: Heart failure, unspecified: I50.9

## 2014-08-26 HISTORY — DX: Acute myocardial infarction, unspecified: I21.9

## 2014-08-26 HISTORY — DX: Low back pain, unspecified: M54.50

## 2014-08-26 HISTORY — DX: Malignant neoplasm of unspecified site of right female breast: C50.911

## 2014-08-26 HISTORY — DX: Low back pain: M54.5

## 2014-08-26 HISTORY — DX: Other chronic pain: G89.29

## 2014-08-26 HISTORY — DX: Malignant neoplasm of thyroid gland: C73

## 2014-08-26 LAB — BASIC METABOLIC PANEL
Anion gap: 7 (ref 5–15)
BUN: 50 mg/dL — AB (ref 6–23)
CHLORIDE: 112 mmol/L (ref 96–112)
CO2: 21 mmol/L (ref 19–32)
CREATININE: 1.73 mg/dL — AB (ref 0.50–1.10)
Calcium: 8.3 mg/dL — ABNORMAL LOW (ref 8.4–10.5)
GFR calc Af Amer: 36 mL/min — ABNORMAL LOW (ref >90)
GFR, EST NON AFRICAN AMERICAN: 31 mL/min — AB (ref >90)
GLUCOSE: 90 mg/dL (ref 70–99)
POTASSIUM: 4.4 mmol/L (ref 3.5–5.1)
Sodium: 140 mmol/L (ref 135–145)

## 2014-08-26 LAB — URINALYSIS, ROUTINE W REFLEX MICROSCOPIC
BILIRUBIN URINE: NEGATIVE
Glucose, UA: NEGATIVE mg/dL
HGB URINE DIPSTICK: NEGATIVE
KETONES UR: NEGATIVE mg/dL
Leukocytes, UA: NEGATIVE
NITRITE: NEGATIVE
Protein, ur: NEGATIVE mg/dL
SPECIFIC GRAVITY, URINE: 1.018 (ref 1.005–1.030)
UROBILINOGEN UA: 0.2 mg/dL (ref 0.0–1.0)
pH: 5 (ref 5.0–8.0)

## 2014-08-26 LAB — CBG MONITORING, ED: Glucose-Capillary: 88 mg/dL (ref 70–99)

## 2014-08-26 LAB — CBC
HCT: 32.1 % — ABNORMAL LOW (ref 36.0–46.0)
Hemoglobin: 9.9 g/dL — ABNORMAL LOW (ref 12.0–15.0)
MCH: 31.1 pg (ref 26.0–34.0)
MCHC: 30.8 g/dL (ref 30.0–36.0)
MCV: 100.9 fL — AB (ref 78.0–100.0)
PLATELETS: 230 10*3/uL (ref 150–400)
RBC: 3.18 MIL/uL — AB (ref 3.87–5.11)
RDW: 14.7 % (ref 11.5–15.5)
WBC: 6.1 10*3/uL (ref 4.0–10.5)

## 2014-08-26 LAB — BRAIN NATRIURETIC PEPTIDE: B Natriuretic Peptide: 101.3 pg/mL — ABNORMAL HIGH (ref 0.0–100.0)

## 2014-08-26 LAB — I-STAT TROPONIN, ED: Troponin i, poc: 0.01 ng/mL (ref 0.00–0.08)

## 2014-08-26 MED ORDER — OXYCODONE HCL 5 MG PO TABS
15.0000 mg | ORAL_TABLET | Freq: Four times a day (QID) | ORAL | Status: DC | PRN
  Administered 2014-08-27 (×3): 15 mg via ORAL
  Filled 2014-08-26 (×3): qty 3

## 2014-08-26 MED ORDER — OXYCODONE HCL 5 MG PO TABS: 10.0000 mg | ORAL_TABLET | Freq: Four times a day (QID) | ORAL | Status: DC | PRN

## 2014-08-26 MED ORDER — NITROGLYCERIN 0.4 MG SL SUBL: 0.4000 mg | SUBLINGUAL_TABLET | SUBLINGUAL | Status: DC | PRN

## 2014-08-26 MED ORDER — CARVEDILOL 3.125 MG PO TABS
3.1250 mg | ORAL_TABLET | Freq: Two times a day (BID) | ORAL | Status: DC
  Administered 2014-08-26 – 2014-08-28 (×4): 3.125 mg via ORAL
  Filled 2014-08-26 (×4): qty 1

## 2014-08-26 MED ORDER — FLUOXETINE HCL 20 MG PO CAPS
40.0000 mg | ORAL_CAPSULE | Freq: Every day | ORAL | Status: DC
  Administered 2014-08-27 – 2014-08-28 (×2): 40 mg via ORAL
  Filled 2014-08-26 (×4): qty 2

## 2014-08-26 MED ORDER — ONDANSETRON HCL 4 MG PO TABS: 8.0000 mg | ORAL_TABLET | Freq: Three times a day (TID) | ORAL | Status: DC | PRN

## 2014-08-26 MED ORDER — SODIUM CHLORIDE 0.9 % IJ SOLN
3.0000 mL | Freq: Two times a day (BID) | INTRAMUSCULAR | Status: DC
  Administered 2014-08-26 – 2014-08-28 (×4): 3 mL via INTRAVENOUS

## 2014-08-26 MED ORDER — TEMAZEPAM 15 MG PO CAPS
15.0000 mg | ORAL_CAPSULE | Freq: Every evening | ORAL | Status: DC | PRN
  Administered 2014-08-26 – 2014-08-27 (×2): 15 mg via ORAL
  Filled 2014-08-26 (×3): qty 1

## 2014-08-26 MED ORDER — OXYCODONE HCL ER 10 MG PO T12A
20.0000 mg | EXTENDED_RELEASE_TABLET | Freq: Two times a day (BID) | ORAL | Status: DC
  Administered 2014-08-26 – 2014-08-28 (×4): 20 mg via ORAL
  Filled 2014-08-26 (×4): qty 2

## 2014-08-26 MED ORDER — SODIUM CHLORIDE 0.9 % IV SOLN
INTRAVENOUS | Status: DC
  Administered 2014-08-26: 23:00:00 via INTRAVENOUS

## 2014-08-26 MED ORDER — OXYCODONE-ACETAMINOPHEN 5-325 MG PO TABS: 1.0000 | ORAL_TABLET | Freq: Four times a day (QID) | ORAL | Status: DC | PRN

## 2014-08-26 MED ORDER — OXYCODONE-ACETAMINOPHEN 5-325 MG PO TABS
1.0000 | ORAL_TABLET | Freq: Four times a day (QID) | ORAL | Status: DC | PRN
  Administered 2014-08-27 (×3): 1 via ORAL
  Filled 2014-08-26 (×4): qty 1

## 2014-08-26 MED ORDER — LEVOTHYROXINE SODIUM 100 MCG PO TABS
300.0000 ug | ORAL_TABLET | Freq: Every day | ORAL | Status: DC
  Administered 2014-08-27: 300 ug via ORAL
  Filled 2014-08-26: qty 3

## 2014-08-26 MED ORDER — HEPARIN SODIUM (PORCINE) 5000 UNIT/ML IJ SOLN
5000.0000 [IU] | Freq: Three times a day (TID) | INTRAMUSCULAR | Status: DC
  Administered 2014-08-26 – 2014-08-28 (×5): 5000 [IU] via SUBCUTANEOUS
  Filled 2014-08-26 (×5): qty 1

## 2014-08-26 MED ORDER — PREGABALIN 75 MG PO CAPS
200.0000 mg | ORAL_CAPSULE | Freq: Two times a day (BID) | ORAL | Status: DC
  Administered 2014-08-26 – 2014-08-28 (×4): 200 mg via ORAL
  Filled 2014-08-26 (×2): qty 2
  Filled 2014-08-26 (×2): qty 1
  Filled 2014-08-26: qty 2
  Filled 2014-08-26: qty 1
  Filled 2014-08-26: qty 2
  Filled 2014-08-26: qty 1

## 2014-08-26 NOTE — ED Notes (Signed)
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 Pt's breast cancer was on her right side. Has an abdominal hernia she named "matilda".

## 2014-08-26 NOTE — H&P (Signed)
Date: 08/26/2014               Patient Name:  Kerri Vasquez MRN: [id number]  DOB: Jan 28, 1956 Age / Sex: 60 y.o., female   PCP: No Pcp Per Patient              Medical Service: Internal Medicine Teaching Service              Attending Physician: Dr. Annia Belt, MD    First Contact: Dr. Randell Patient Pager: [phone]  Second Contact: Dr. Naaman Plummer Pager: [phone]            After Hours (After 5p/  First Contact Pager: [phone]  weekends / holidays): Second Contact Pager: [phone]   Chief Complaint:  Shortness of breath.  History of Present Illness: Kerri Vasquez is a 59 year old woman with history of depression, morbid obesity status post gastric bypass, breast cancer with metastases to liver, Takotsubo cardiomyopathy, hypertension, hypothyroidism, osteoarthritis, iron deficiency anemia, and GERD presenting with shortness of breath.  The patient was admitted last month for ablation of the liver lesion, and she developed acute kidney injury, NSTEMI, and a CHF exacerbation. Left heart catheterization was performed on 07/14/2014 and showed minimal coronary artery disease but concern for Takotsubo cardiomyopathy. She was aggressively diuresed, and she was discharged home on Coreg, losartan, and spironolactone. She saw cardiology at follow-up on 08/13/2014 who increased her spironolactone dose, stopped her losartan, and started her on Lasix 20 mg daily when necessary. The patient reports increasing lower extremity swelling along with dyspnea on exertion since then, which has worsened over the past week. Her daughter has increased her Lasix dose by 20 mg each day, and gave her Lasix 80 mg today without good urine output.  She reports gaining 14 pounds since yesterday.  She reports intermittent chest pain that has responded to her home nitro.  She describes the chest pain as heaviness centered in her chest with occasional radiation to her left shoulder.  She denies chest pain currently.  She  reports bilateral leg pain associated with the swelling. She thinks the swelling and pain is worse in her R leg.  She also reports intermittent diarrhea since her previous hospitalization that has been worse over the past few days with 5-6 bowel movements per day.  In the ER, her vital signs were stable, but she was noted to have acute kidney injury. No treatment was initiated and she was admitted.  Review of Systems: Review of Systems  Constitutional: Positive for malaise/fatigue. Negative for fever, chills and diaphoresis.  HENT: Negative for congestion and sore throat.   Respiratory: Positive for shortness of breath. Negative for cough, sputum production and wheezing.   Cardiovascular: Positive for chest pain and leg swelling. Negative for palpitations and orthopnea.  Gastrointestinal: Positive for diarrhea (Intermittent). Negative for nausea, vomiting and constipation.  Genitourinary: Negative for dysuria and frequency.  Musculoskeletal: Positive for myalgias, back pain (chronic) and joint pain. Negative for neck pain.  Skin: Negative for rash.  Neurological: Negative for dizziness, sensory change, speech change, focal weakness and headaches.    Meds: Medications Prior to Admission  Medication Sig Dispense Refill  . acetaminophen (TYLENOL) 500 MG tablet Take 1,000 mg by mouth every 4 (four) hours as needed for moderate pain.    . carvedilol (COREG) 3.125 MG tablet Take 1 tablet (3.125 mg total) by mouth 2 (two) times daily with a meal. 60 tablet 1  . FLUoxetine (PROZAC) 40 MG capsule Take 40 mg by  mouth daily before breakfast.     . furosemide (LASIX) 20 MG tablet TAKE 1 TABLET BY MOUTH DAILY AS NEEDED 90 tablet 3  . levothyroxine (SYNTHROID, LEVOTHROID) 300 MCG tablet TAKE 1 TABLET BY MOUTH DAILY BEFORE BREAKFAST 30 tablet 0  . LORazepam (ATIVAN) 0.5 MG tablet PLACE 1 TABLET UNDER THE TONGUE EVERY 6 HOURS AS NEEDED FOR NAUSEA 60 tablet 0  . methocarbamol (ROBAXIN) 750 MG tablet Take  750 mg by mouth 4 (four) times daily.    . nitroGLYCERIN (NITROSTAT) 0.4 MG SL tablet Place 1 tablet (0.4 mg total) under the tongue every 5 (five) minutes as needed for chest pain. 30 tablet 12  . ondansetron (ZOFRAN) 8 MG tablet Take 1 tablet (8 mg total) by mouth every 8 (eight) hours as needed for nausea or vomiting. 60 tablet 2  . oxyCODONE (OXY IR/ROXICODONE) 5 MG immediate release tablet Take 5 mg by mouth every 4 (four) hours as needed (For pain.).    Marland Kitchen OxyCODONE (OXYCONTIN) 20 mg T12A 12 hr tablet Take 20 mg by mouth every 12 (twelve) hours.    . potassium chloride SA (K-DUR,KLOR-CON) 20 MEQ tablet TAKE 1 TABLET BY MOUTH DAILY AS NEEDED. TAKE ONLY IF YOU TAKE LASIX 90 tablet 3  . pregabalin (LYRICA) 200 MG capsule Take 1 capsule (200 mg total) by mouth 2 (two) times daily. 60 capsule 1  . promethazine (PHENERGAN) 25 MG tablet TAKE 1 TABLET BY MOUTH THREE TIMES DAILY AS NEEDED FOR NAUSEA 90 tablet 0  . spironolactone (ALDACTONE) 25 MG tablet Take 1 tablet (25 mg total) by mouth daily. 30 tablet 3  . temazepam (RESTORIL) 15 MG capsule 15 mg po qhs prn for sleep.  If needed, can take an additional 15mg . (Patient taking differently: Take 15 mg by mouth at bedtime as needed. ) 50 capsule 0  . tiZANidine (ZANAFLEX) 4 MG tablet Take 4 mg by mouth every 6 (six) hours as needed for muscle spasms.    . [DISCONTINUED] furosemide (LASIX) 20 MG tablet Take 20 mg by mouth daily as needed for fluid.    . feeding supplement, ENSURE COMPLETE, (ENSURE COMPLETE) LIQD Take 237 mLs by mouth daily at 3 pm. (Patient not taking: Reported on 08/13/2014) 30 Bottle 5   Current Facility-Administered Medications  Medication Dose Route Frequency Provider Last Rate Last Dose  . 0.9 %  sodium chloride infusion   Intravenous Continuous Francesca Oman, DO      . carvedilol (COREG) tablet 3.125 mg  3.125 mg Oral BID WC Francesca Oman, DO      . [START ON 08/27/2014] FLUoxetine (PROZAC) capsule 40 mg  40 mg Oral QAC breakfast  Francesca Oman, DO      . heparin injection 5,000 Units  5,000 Units Subcutaneous 3 times per day Francesca Oman, DO      . [START ON 08/27/2014] levothyroxine (SYNTHROID, LEVOTHROID) tablet 300 mcg  300 mcg Oral QAC breakfast Francesca Oman, DO      . nitroGLYCERIN (NITROSTAT) SL tablet 0.4 mg  0.4 mg Sublingual Q5 min PRN Francesca Oman, DO      . ondansetron [facility]) tablet 8 mg  8 mg Oral Q8H PRN Francesca Oman, DO      . OxyCODONE (OXYCONTIN) 12 hr tablet 20 mg  20 mg Oral Q12H Francesca Oman, DO      . pregabalin (LYRICA) capsule 200 mg  200 mg Oral BID Francesca Oman, DO      .  sodium chloride 0.9 % injection 3 mL  3 mL Intravenous Q12H Francesca Oman, DO      . temazepam (RESTORIL) capsule 15 mg  15 mg Oral QHS PRN Francesca Oman, DO        Allergies: Allergies as of 08/26/2014 - Review Complete 08/26/2014  Allergen Reaction Noted  . Influenza vac split [flu virus vaccine] Other (See Comments) 03/07/2011  . Ritalin [methylphenidate hcl]  07/13/2014  . Zostavax [zoster vaccine live (oka-merck)] Other (See Comments) 03/07/2011  . Adhesive [tape] Itching and Rash 05/17/2011   Past Medical History  Diagnosis Date  . Depression   . Hypertension   . Migraine   . Hypothyroidism   . Incisional hernia   . Spinal stenosis   . Scoliosis   . Sciatica   . Intestinal obstruction   . GERD (gastroesophageal reflux disease)   . Septic arthritis 01/28/10    and spinal stenosis , moderate scoliosis   . Osteoarthritis of multiple joints   . Anemia   . Pernicious anemia 05/30/2011    Iron transfusion and VB12 on 06/06/11  . Sleep apnea     hx of off machine x 4 years   . Diabetes mellitus     type II, controlled by diet 05/25/11   . Renal insufficiency     hx renal failure 2011  . Fibromyalgia   . Fracture of multiple toes     right toes x 4 toes- excluding small toe   Past Surgical History  Procedure Laterality Date  . Tonsillectomy  1969  . Multiple extractions with alveoloplasty  06/08/2011     Procedure: MULTIPLE EXTRACION WITH ALVEOLOPLASTY;  Surgeon: Lenn Cal, DDS;  Location: [facility];  Service: Oral Surgery;  Laterality: N/A;  Extraction of tooth #'s 3,4,7 with alveoloplasty and Gross Debridement of Remaining Teeth  . Gastric bypass  2006  . Intestinal blockage  2011    surgery for this   . Breast surgery  2008     bilateral mastectomy  . Breast reconstruction  2008, 2009  . Cholecystectomy  06/29/85  . Radio frequency ablation to liver  sept 27, 2013  . Thyroid lobectomy  02/16/2012    Procedure: THYROID LOBECTOMY;  Surgeon: Earnstine Regal, MD;  Location: [facility];  Service: General;  Laterality: Right;  . Left heart catheterization with coronary angiogram N/A 07/14/2014    Procedure: LEFT HEART CATHETERIZATION WITH CORONARY ANGIOGRAM;  Surgeon: Leonie Man, MD;  Location: [facility] CATH LAB;  Service: Cardiovascular;  Laterality: N/A;   Family History  Problem Relation Age of Onset  . Cancer Mother     breast  . Hypertension Mother   . Anesthesia problems Daughter    History   Social History  . Marital Status: Divorced    Spouse Name: N/A  . Number of Children: 1  . Years of Education: N/A   Occupational History  . Not on file.   Social History Main Topics  . Smoking status: Never Smoker   . Smokeless tobacco: Never Used     Comment: never used tobacco  . Alcohol Use: No  . Drug Use: No  . Sexual Activity: Not on file   Other Topics Concern  . Not on file   Social History Narrative   Caffeine use: 3 cups coffee/tea daily   Regular exercise: no          Physical Exam: Filed Vitals:   08/26/14 1900  BP: 117/66  Pulse: 88  Temp: 97.5  Resp: 10   Physical Exam  Constitutional: She is oriented to person, place, and time and well-developed, well-nourished, and in no distress. No distress.  Obese.  HENT:  Head: Normocephalic and atraumatic.  Eyes: Conjunctivae and EOM are normal. Pupils are equal, round, and reactive to light. No scleral icterus.   Neck: Normal range of motion. Neck supple.  Cardiovascular: Normal rate, regular rhythm and normal heart sounds.   Pulmonary/Chest: Effort normal and breath sounds normal. No respiratory distress. She has no wheezes.  Abdominal: Soft. Bowel sounds are normal. She exhibits distension (Some fluid present.). There is no tenderness. There is no rebound.  Midline hernia, reducible.  Musculoskeletal: Normal range of motion. She exhibits edema (3+ pitting edema above knees bilaterally.) and tenderness (Will palpation of shins.).  Lymphedema of R arm.  Neurological: She is alert and oriented to person, place, and time. No cranial nerve deficit. She exhibits normal muscle tone.  Skin: Skin is warm and dry. No rash noted. She is not diaphoretic. No erythema.    Lab results: Basic Metabolic Panel:  Recent Labs  08/26/14 1617  NA 140  K 4.4  CL 112  CO2 21  GLUCOSE 90  BUN 50*  CREATININE 1.73*  CALCIUM 8.3*   CBC:  Recent Labs  08/26/14 1617  WBC 6.1  HGB 9.9*  HCT 32.1*  MCV 100.9*  PLT 230   CBG:  Recent Labs  08/26/14 1622  GLUCAP 88   Urinalysis:  Recent Labs  08/26/14 1850  COLORURINE YELLOW  LABSPEC 1.018  PHURINE 5.0  GLUCOSEU NEGATIVE  HGBUR NEGATIVE  BILIRUBINUR NEGATIVE  KETONESUR NEGATIVE  PROTEINUR NEGATIVE  UROBILINOGEN 0.2  NITRITE NEGATIVE  LEUKOCYTESUR NEGATIVE   BNP    Component Value Date/Time   BNP 101.3* 08/26/2014 1617   Troponin (Point of Care Test)  Recent Labs  08/26/14 1644  TROPIPOC 0.01   Imaging results:  Dg Chest 2 View  08/26/2014   CLINICAL DATA:  59 year old female with a history of transport assure and wheezing.  EXAM: CHEST - 2 VIEW  COMPARISON:  07/13/2014, 07/11/2014  FINDINGS: Low lung volumes accentuating the interstitium.  Persistent elevation of the right hemidiaphragm.  Apical lordotic position.  Linear opacity of the base the right lung.  Atherosclerosis of the aortic arch.  Interval improvement in airspace  opacities bilaterally.  No pneumothorax.  No evidence of confluent airspace disease.  No displaced fracture.  IMPRESSION: Interval improvement in the bilateral airspace disease, with no evidence on the current x-ray of acute cardiopulmonary disease.  Persistent elevation of the right hemidiaphragm with basilar atelectasis/ scarring.  Atherosclerosis.  Signed,  Dulcy Fanny. Earleen Newport, DO  [facility]  [facility]   Electronically Signed   By: Corrie Mckusick D.O.   On: 08/26/2014 15:46    Other results: EKG: normal sinus rhythm, unchanged from prior.  Assessment & Plan by Problem: Principal Problem:   AKI (acute kidney injury) Active Problems:   Depression   Anemia   Essential hypertension   Hypothyroidism   Chronic pain syndrome   Chronic systolic heart failure   #Acute kidney injury Creatinine up from baseline of 0.9 most likely secondary to overdiuresis from using more than prescribed dose of Lasix. Urinalysis unremarkable.  Potentially worsened by diarrhea, which may have contributed to her dehydration. -Admit to MedSurg. -Follow-up urine creatinine and urea. -Normal saline at 50 mL per hour. -Hold diuretics. -Regular diet.  #Takotsubo cardiomyopathy BNP significantly improved from prior,  only slightly elevated. Echocardiogram in March 2016 with ejection fraction of 20-25% with severe hypokinesis of the inferior myocardium. No active cardiopulmonary disease on chest x-ray. Weight apparently up 34 pounds since last measurement. -Hold home Lasix and K-Dur due to AKI. -Consider consulting heart failure in the morning. -Consider repeating echocardiogram. -OT and PT eval and treat.  #Chest pain Chest discomfort that is relieved by nitroglycerin. Initial EKG and troponin unremarkable with no chest pain currently. -Continue home nitroglycerin glycerin subungual tablet 0.4 mg as needed. -Trend troponins x3.  #Diarrhea Intermittent over last  month. No recent antibiotic exposure.  Palbociclib can cause diarrhea, but she has not taken this recently. -Continue to monitor and consider C. difficile PCR if persistent.  #Metastatic breast cancer Follows with Dr. Marin Olp of oncology at the [facility]. She is currently on palbociclib and fluvestrant, and she has received approximately a month ago prior to her previous admission. She is scheduled to see oncology on 09/03/2014 to determine if she should continue these therapies. -Follow-up with oncology as an outpatient. -Continue home Zofran 8 mg every 8 hours as needed.  #Anemia of chronic disease Hemoglobin stable from admission one month ago. Last anemia panel in February 2016 consistent with anemia of chronic disease. History of B-12 and iron deficiency secondary to gastric bypass, and she receives vitamin B-12 IM monthly and IV iron as indicated. -Continue to monitor.  #Hypertension -Continue home Coreg 3.125 mg BID.  #Hypothyroidism Status post hemithyroidectomy for Hurthle cell tumor. -Continue home Synthroid 300 mcg.  #Osteoarthritis/chronic pain Reports pain in her back that is chronic along with occasional muscle spasms in her lower extremities for which she takes Robaxin or tizanidine. However, she does not report taking these medications recently. She does have good reason to be in pain given her breast cancer. Reports following with a pain clinic for pain and taking OxyContin along with Percocet/oxycodone 20-325 mg every 4 hours as needed, trying to cut back to every 5 or 6 hours. -Continue home OxyContin 20 mg every 12 hours. -Continue home Percocet/oxycodone 20-325 mg every 6 hours as needed. -Continue home Lyrica 200 mg twice a day.  #Status post gastric bypass Reported history of type 2 diabetes on medical history, currently controlled on diet without medications. Hemoglobin A1c's in the low fives most recently. -Monitor glucose on  BMP.  #Depression/anxiety Reports not taking Ativan currently. -Continue home Prozac 40 mg daily. -Continue home temazepam 15 mg daily at bedtime when necessary.  #DVT prophylaxis -Heparin.  #Code status Patient was previously made DNR after palliative care consult. Discussed with daughter and would like to be full code at this time. -FULL CODE.  Dispo: Disposition is deferred at this time, awaiting improvement of current medical problems. Anticipated discharge in approximately 2-4 day(s).   The patient does not have a current PCP (No Pcp Per Patient), therefore will be require OPC follow-up after discharge.   The patient does have transportation limitations that hinder transportation to clinic appointments.   Signed:  Arman Filter, MD, PhD PGY-1 Internal Medicine Teaching Service Pager: [phone] 08/26/2014, 9:37 PM

## 2014-08-26 NOTE — Telephone Encounter (Signed)
Spoke w/Barbara pt has 2 + pitting edema, daughter increased Lasix and gave her 80 mg Lasix yesterday and wt is up from 148 yesterday to 158.6 today.  She states pt is not SOB but is very fatigue.  Discussed w/Amy Ninfa Meeker, NP since wt is up so much with Lasix she advised pt go to ER, [facility] aware.  Also called pt's daughter [name] and she is aware and will bring pt to ER

## 2014-08-26 NOTE — ED Provider Notes (Signed)
I saw and evaluated the patient, reviewed the resident's note and I agree with the findings and plan.   EKG Interpretation   Date/Time:  Wednesday August 26 2014 14:44:28 EDT Ventricular Rate:  95 PR Interval:  164 QRS Duration: 80 QT Interval:  342 QTC Calculation: 429 R Axis:   -3 Text Interpretation:  Normal sinus rhythm Low voltage QRS Inferior infarct  , age undetermined Cannot rule out Anterior infarct , age undetermined  Abnormal ECG No significant change was found Confirmed by [facility]  MD,  TREY ([id number]) on 08/26/2014 5:46:29 PM      59 yo female with shortness of breath and weight gain despite increasing lasix.  On exam, well appearing, nontoxic, not distressed, normal respiratory effort, normal perfusion, lungs CTAB, heart sounds normal with RRR, 3+ BLE pitting edema.  Labs show AKI.    Admitted.  Clinical Impression: 1. Dyspnea on exertion   2. Peripheral edema       Serita Grit, MD 08/27/14 [phone]

## 2014-08-26 NOTE — Telephone Encounter (Signed)
Nurse from [facility] called she would like to speak to someone the lasix are not working.. Please advise

## 2014-08-26 NOTE — ED Notes (Signed)
Lab at the bedside 

## 2014-08-26 NOTE — ED Notes (Signed)
Pt here more for heaviness and SOB to her chest than really pain she says. Has hx of CHF. Was having stabbing pains in her chest the last 2 hours when she was breathing. Has taken 3 NTG. Daughter is here with her. Pt is also a chemo pt for breast cancer.

## 2014-08-26 NOTE — Progress Notes (Signed)
Received patient from ED.  Paged admissions.  Patient AOx4, ambulatory, VS stable, pain controlled 3/10.

## 2014-08-26 NOTE — Telephone Encounter (Signed)
Pt ftka today, called and left message with son to call us and r/s appt

## 2014-08-26 NOTE — Telephone Encounter (Signed)
Left message to call back  

## 2014-08-26 NOTE — ED Provider Notes (Signed)
CSN: [id number]     Arrival date & time 08/26/14  1437 History   First MD Initiated Contact with Patient 08/26/14 1552     Chief Complaint  Patient presents with  . Shortness of Breath  . Chest Pain    (Consider location/radiation/quality/duration/timing/severity/associated sxs/prior Treatment) Patient is a 59 y.o. female presenting with shortness of breath. The history is provided by the patient. No language interpreter was used.  Shortness of Breath Severity:  Moderate Onset quality:  Gradual Timing:  Constant Progression:  Worsening Chronicity:  New Context: activity   Relieved by:  Rest Worsened by:  Activity Ineffective treatments:  None tried Associated symptoms: chest pain   Associated symptoms: no abdominal pain, no cough, no diaphoresis, no fever, no headaches, no sputum production, no vomiting and no wheezing   Risk factors: hx of cancer, obesity and recent surgery     Past Medical History  Diagnosis Date  . Depression   . Hypertension   . Migraine   . Hypothyroidism   . Incisional hernia   . Spinal stenosis   . Scoliosis   . Sciatica   . Intestinal obstruction   . GERD (gastroesophageal reflux disease)   . Septic arthritis 01/28/10    and spinal stenosis , moderate scoliosis   . Osteoarthritis of multiple joints   . Anemia   . Pernicious anemia 05/30/2011    Iron transfusion and VB12 on 06/06/11  . Sleep apnea     hx of off machine x 4 years   . Diabetes mellitus     type II, controlled by diet 05/25/11   . Renal insufficiency     hx renal failure 2011  . Fibromyalgia   . Fracture of multiple toes     right toes x 4 toes- excluding small toe   Past Surgical History  Procedure Laterality Date  . Tonsillectomy  1969  . Multiple extractions with alveoloplasty  06/08/2011    Procedure: MULTIPLE EXTRACION WITH ALVEOLOPLASTY;  Surgeon: Lenn Cal, DDS;  Location: [facility];  Service: Oral Surgery;  Laterality: N/A;  Extraction of tooth #'s 3,4,7 with  alveoloplasty and Gross Debridement of Remaining Teeth  . Gastric bypass  2006  . Intestinal blockage  2011    surgery for this   . Breast surgery  2008     bilateral mastectomy  . Breast reconstruction  2008, 2009  . Cholecystectomy  06/29/85  . Radio frequency ablation to liver  sept 27, 2013  . Thyroid lobectomy  02/16/2012    Procedure: THYROID LOBECTOMY;  Surgeon: Earnstine Regal, MD;  Location: [facility];  Service: General;  Laterality: Right;  . Left heart catheterization with coronary angiogram N/A 07/14/2014    Procedure: LEFT HEART CATHETERIZATION WITH CORONARY ANGIOGRAM;  Surgeon: Leonie Man, MD;  Location: [facility] CATH LAB;  Service: Cardiovascular;  Laterality: N/A;   Family History  Problem Relation Age of Onset  . Cancer Mother     breast  . Hypertension Mother   . Anesthesia problems Daughter    History  Substance Use Topics  . Smoking status: Never Smoker   . Smokeless tobacco: Never Used     Comment: never used tobacco  . Alcohol Use: No   OB History    No data available     Review of Systems  Constitutional: Positive for fatigue. Negative for fever and diaphoresis.  Respiratory: Positive for chest tightness and shortness of breath. Negative for cough, sputum production and wheezing.   Cardiovascular:  Positive for chest pain and leg swelling. Negative for palpitations.  Gastrointestinal: Negative for nausea, vomiting, abdominal pain and diarrhea.  Genitourinary: Negative for dysuria.  Neurological: Negative for weakness, light-headedness, numbness and headaches.  Psychiatric/Behavioral: Negative for confusion.  All other systems reviewed and are negative.     Allergies  Influenza vac split; Ritalin; Zostavax; and Adhesive  Home Medications   Prior to Admission medications   Medication Sig Start Date End Date Taking? Authorizing Provider  acetaminophen (TYLENOL) 500 MG tablet Take 1,000 mg by mouth every 4 (four) hours as needed for moderate pain.     Historical Provider, MD  carvedilol (COREG) 3.125 MG tablet Take 1 tablet (3.125 mg total) by mouth 2 (two) times daily with a meal. 07/18/14   Annita Brod, MD  feeding supplement, ENSURE COMPLETE, (ENSURE COMPLETE) LIQD Take 237 mLs by mouth daily at 3 pm. Patient not taking: Reported on 08/13/2014 07/18/14   Annita Brod, MD  FLUoxetine (PROZAC) 40 MG capsule Take 40 mg by mouth daily before breakfast.     Historical Provider, MD  furosemide (LASIX) 20 MG tablet TAKE 1 TABLET BY MOUTH DAILY AS NEEDED 08/25/14   Jolaine Artist, MD  levothyroxine (SYNTHROID, LEVOTHROID) 300 MCG tablet TAKE 1 TABLET BY MOUTH DAILY BEFORE BREAKFAST 08/03/14   Volanda Napoleon, MD  LORazepam (ATIVAN) 0.5 MG tablet PLACE 1 TABLET UNDER THE TONGUE EVERY 6 HOURS AS NEEDED FOR NAUSEA 08/24/14   Volanda Napoleon, MD  methocarbamol (ROBAXIN) 750 MG tablet Take 750 mg by mouth 4 (four) times daily.    Historical Provider, MD  nitroGLYCERIN (NITROSTAT) 0.4 MG SL tablet Place 1 tablet (0.4 mg total) under the tongue every 5 (five) minutes as needed for chest pain. 07/18/14   Annita Brod, MD  ondansetron (ZOFRAN) 8 MG tablet Take 1 tablet (8 mg total) by mouth every 8 (eight) hours as needed for nausea or vomiting. 07/20/14   Volanda Napoleon, MD  oxyCODONE (OXY IR/ROXICODONE) 5 MG immediate release tablet Take 5 mg by mouth every 4 (four) hours as needed (For pain.).    Historical Provider, MD  OxyCODONE (OXYCONTIN) 20 mg T12A 12 hr tablet Take 20 mg by mouth every 12 (twelve) hours.    Historical Provider, MD  potassium chloride SA (K-DUR,KLOR-CON) 20 MEQ tablet TAKE 1 TABLET BY MOUTH DAILY AS NEEDED. TAKE ONLY IF YOU TAKE LASIX 08/25/14   Jolaine Artist, MD  pregabalin (LYRICA) 200 MG capsule Take 1 capsule (200 mg total) by mouth 2 (two) times daily. 07/18/14   Annita Brod, MD  promethazine (PHENERGAN) 25 MG tablet TAKE 1 TABLET BY MOUTH THREE TIMES DAILY AS NEEDED FOR NAUSEA 08/03/14   Volanda Napoleon, MD   spironolactone (ALDACTONE) 25 MG tablet Take 1 tablet (25 mg total) by mouth daily. 08/13/14   Shaune Pascal Bensimhon, MD  temazepam (RESTORIL) 15 MG capsule 15 mg po qhs prn for sleep.  If needed, can take an additional 15mg . 07/18/14   Annita Brod, MD  tiZANidine (ZANAFLEX) 4 MG tablet Take 4 mg by mouth every 6 (six) hours as needed for muscle spasms.    Historical Provider, MD   BP 106/68 mmHg  Pulse 101  Temp(Src) 97.5 F (36.4 C) (Oral)  Resp 16  Ht 5\' 6"  (1.676 m)  Wt 258 lb (117.028 kg)  BMI 41.66 kg/m2  SpO2 100%  LMP 08/30/2006   Physical Exam  Constitutional: She is oriented to person, place, and  time. She appears well-developed and well-nourished. She is active. No distress.  HENT:  Head: Normocephalic and atraumatic.  Nose: Nose normal.  Mouth/Throat: Oropharynx is clear and moist. No oropharyngeal exudate.  Eyes: EOM are normal. Pupils are equal, round, and reactive to light.  Neck: Normal range of motion. Neck supple.  Cardiovascular: Regular rhythm, normal heart sounds and intact distal pulses.  Tachycardia present.   No murmur heard. Pulmonary/Chest: Effort normal. No respiratory distress. She has no wheezes. She exhibits no tenderness.  Normal work of breathing, no retractions Decreased breath sounds 2/2 body habitus, but symmetric bilaterally  Abdominal: Soft. There is no tenderness. There is no rebound, no guarding and no CVA tenderness. A hernia (large mid abdomen hernia, soft, reducible, nontender) is present.  Musculoskeletal: Normal range of motion. She exhibits edema (2+ bilateral pitting edema). She exhibits no tenderness.  Lymphadenopathy:    She has no cervical adenopathy.  Neurological: She is alert and oriented to person, place, and time. No cranial nerve deficit. Coordination normal.  Skin: Skin is warm and dry. She is not diaphoretic.  Psychiatric: She has a normal mood and affect. Her behavior is normal. Judgment and thought content normal.   Nursing note and vitals reviewed.   ED Course  Procedures (including critical care time) Labs Review Labs Reviewed  CBC - Abnormal; Notable for the following:    RBC 3.18 (*)    Hemoglobin 9.9 (*)    HCT 32.1 (*)    MCV 100.9 (*)    All other components within normal limits  BASIC METABOLIC PANEL - Abnormal; Notable for the following:    BUN 50 (*)    Creatinine, Ser 1.73 (*)    Calcium 8.3 (*)    GFR calc non Af Amer 31 (*)    GFR calc Af Amer 36 (*)    All other components within normal limits  BRAIN NATRIURETIC PEPTIDE - Abnormal; Notable for the following:    B Natriuretic Peptide 101.3 (*)    All other components within normal limits  I-STAT TROPOININ, ED  CBG MONITORING, ED   Imaging Review Dg Chest 2 View  08/26/2014   CLINICAL DATA:  59 year old female with a history of transport assure and wheezing.  EXAM: CHEST - 2 VIEW  COMPARISON:  07/13/2014, 07/11/2014  FINDINGS: Low lung volumes accentuating the interstitium.  Persistent elevation of the right hemidiaphragm.  Apical lordotic position.  Linear opacity of the base the right lung.  Atherosclerosis of the aortic arch.  Interval improvement in airspace opacities bilaterally.  No pneumothorax.  No evidence of confluent airspace disease.  No displaced fracture.  IMPRESSION: Interval improvement in the bilateral airspace disease, with no evidence on the current x-ray of acute cardiopulmonary disease.  Persistent elevation of the right hemidiaphragm with basilar atelectasis/ scarring.  Atherosclerosis.  Signed,  Dulcy Fanny. Earleen Newport, DO  [facility]  [facility]   Electronically Signed   By: Corrie Mckusick D.O.   On: 08/26/2014 15:46     EKG Interpretation   Date/Time:  Wednesday August 26 2014 14:44:28 EDT Ventricular Rate:  95 PR Interval:  164 QRS Duration: 80 QT Interval:  342 QTC Calculation: 429 R Axis:   -3 Text Interpretation:  Normal sinus rhythm Low voltage QRS  Inferior infarct  , age undetermined Cannot rule out Anterior infarct , age undetermined  Abnormal ECG No significant change was found Confirmed by [facility]  MD,  TREY ([id number]) on 08/26/2014 5:46:29 PM  MDM   Final diagnoses:  Dyspnea on exertion  Peripheral edema  Acute Kidney injury  Pt is a 59 yo F with hx of breast cancer with mets to thyroid and liver, DM, HTN, CAD, and sCHF (EF 15%),  who presents with sx concerning for CHF exacerbation.  Admitted last month for a liver mass ablation then subsequently developed AKI, NSTEMI, and CHF exacerbation.  She underwent a left heart cath on 07/14/14 which showed nonobstructive disease but was concerning for stress induced Takosubo cardiomyopathy.  She was discharged home on 20 mg qday of lasix.  Since discharge, she has had progressive weight gain, lower extremity edema, and dyspnea on exertion.  She was 230# at dc home on 3/19 and is 258# today.  Reports taking 100 mg of lasix (5 x prescribed dose) daily for the past 3 days but is still only urinating 3-4 times daily.  She complains of tightness and swelling in her lower extremities so much so she is not able to fit in her usual shoes.  Denies current chest pain but has had intermittent chest pain episodes over the past few weeks, taking NTG at home several times per week.  No SOB at rest, but + DOE after ambulating across a room.    Speaking in full sentences, O2 sats in upper 90s on room air.  Normotensive.  No reproducible chest pain.  Bilateral mastectomy scars.  No abdominal tenderness, but significant midline hernia (soft, reducible).  RUQ without obvious wound from recent liver mass ablation. 2+ bilateral lower extremity pitting edema.   CXR with no acute pathology.  Does not look fluid overloaded on image.   Labs show normal BNP and trop, however she does have an AKI.  Cr 1.73 vs baseline of 0.9.    Has a PCP in [place] but hasn't seen a PCP in [place] since moving here last year to be with her  daughter.  Will call to unassigned medicine team for admission for AKI.  Patient was triaged to Internal Medicine Resident Team and will be admitted to Dr. Azucena Freed team.  Due to clinical sx of fluid overload on top of her AKI that was likely caused by significant lasix use and third spacing from CHF, so will hold on any additional treatment for now.  Will defer IVF decision to the inpatient team. She is stable for the floor.  Patient and daughter informed and voiced understanding and agreement to the admission.   Patient was seen with ED Attending, Dr. Gwynneth Aliment, MD   Tori Milks, MD 08/27/14 [place], MD 08/27/14 [phone]

## 2014-08-27 DIAGNOSIS — M1990 Unspecified osteoarthritis, unspecified site: Secondary | ICD-10-CM

## 2014-08-27 DIAGNOSIS — C50919 Malignant neoplasm of unspecified site of unspecified female breast: Secondary | ICD-10-CM | POA: Diagnosis not present

## 2014-08-27 DIAGNOSIS — E860 Dehydration: Secondary | ICD-10-CM | POA: Diagnosis not present

## 2014-08-27 DIAGNOSIS — I5022 Chronic systolic (congestive) heart failure: Secondary | ICD-10-CM

## 2014-08-27 DIAGNOSIS — I2510 Atherosclerotic heart disease of native coronary artery without angina pectoris: Secondary | ICD-10-CM | POA: Diagnosis not present

## 2014-08-27 DIAGNOSIS — R197 Diarrhea, unspecified: Secondary | ICD-10-CM

## 2014-08-27 DIAGNOSIS — E0590 Thyrotoxicosis, unspecified without thyrotoxic crisis or storm: Secondary | ICD-10-CM | POA: Diagnosis present

## 2014-08-27 DIAGNOSIS — Z9884 Bariatric surgery status: Secondary | ICD-10-CM

## 2014-08-27 DIAGNOSIS — M549 Dorsalgia, unspecified: Secondary | ICD-10-CM

## 2014-08-27 DIAGNOSIS — E119 Type 2 diabetes mellitus without complications: Secondary | ICD-10-CM

## 2014-08-27 DIAGNOSIS — N179 Acute kidney failure, unspecified: Secondary | ICD-10-CM | POA: Diagnosis not present

## 2014-08-27 DIAGNOSIS — Z79899 Other long term (current) drug therapy: Secondary | ICD-10-CM

## 2014-08-27 DIAGNOSIS — C787 Secondary malignant neoplasm of liver and intrahepatic bile duct: Secondary | ICD-10-CM

## 2014-08-27 DIAGNOSIS — E8809 Other disorders of plasma-protein metabolism, not elsewhere classified: Secondary | ICD-10-CM

## 2014-08-27 DIAGNOSIS — R600 Localized edema: Secondary | ICD-10-CM | POA: Diagnosis not present

## 2014-08-27 DIAGNOSIS — F418 Other specified anxiety disorders: Secondary | ICD-10-CM

## 2014-08-27 DIAGNOSIS — G8929 Other chronic pain: Secondary | ICD-10-CM

## 2014-08-27 DIAGNOSIS — D638 Anemia in other chronic diseases classified elsewhere: Secondary | ICD-10-CM

## 2014-08-27 DIAGNOSIS — I10 Essential (primary) hypertension: Secondary | ICD-10-CM

## 2014-08-27 DIAGNOSIS — R609 Edema, unspecified: Secondary | ICD-10-CM | POA: Insufficient documentation

## 2014-08-27 DIAGNOSIS — Z79891 Long term (current) use of opiate analgesic: Secondary | ICD-10-CM

## 2014-08-27 LAB — PROTIME-INR
INR: 1.2 (ref 0.00–1.49)
Prothrombin Time: 15.3 seconds — ABNORMAL HIGH (ref 11.6–15.2)

## 2014-08-27 LAB — CBC WITH DIFFERENTIAL/PLATELET
BASOS ABS: 0 10*3/uL (ref 0.0–0.1)
Basophils Relative: 1 % (ref 0–1)
Eosinophils Absolute: 0.4 10*3/uL (ref 0.0–0.7)
Eosinophils Relative: 7 % — ABNORMAL HIGH (ref 0–5)
HCT: 28.8 % — ABNORMAL LOW (ref 36.0–46.0)
Hemoglobin: 9.2 g/dL — ABNORMAL LOW (ref 12.0–15.0)
Lymphocytes Relative: 36 % (ref 12–46)
Lymphs Abs: 1.9 10*3/uL (ref 0.7–4.0)
MCH: 32.1 pg (ref 26.0–34.0)
MCHC: 31.9 g/dL (ref 30.0–36.0)
MCV: 100.3 fL — ABNORMAL HIGH (ref 78.0–100.0)
MONO ABS: 0.6 10*3/uL (ref 0.1–1.0)
Monocytes Relative: 12 % (ref 3–12)
NEUTROS ABS: 2.3 10*3/uL (ref 1.7–7.7)
NEUTROS PCT: 44 % (ref 43–77)
Platelets: 192 10*3/uL (ref 150–400)
RBC: 2.87 MIL/uL — ABNORMAL LOW (ref 3.87–5.11)
RDW: 14.8 % (ref 11.5–15.5)
WBC: 5.1 10*3/uL (ref 4.0–10.5)

## 2014-08-27 LAB — TROPONIN I
Troponin I: <0.03 ng/mL (ref <0.031)
Troponin I: 0.03 ng/mL (ref <0.031)

## 2014-08-27 LAB — HEPATITIS PANEL, ACUTE
HCV Ab: NEGATIVE
HEP B C IGM: NONREACTIVE
Hep A IgM: NONREACTIVE
Hepatitis B Surface Ag: NEGATIVE

## 2014-08-27 LAB — HEPATIC FUNCTION PANEL
ALBUMIN: 2.3 g/dL — AB (ref 3.5–5.2)
ALT: 15 U/L (ref 0–35)
AST: 19 U/L (ref 0–37)
Alkaline Phosphatase: 122 U/L — ABNORMAL HIGH (ref 39–117)
BILIRUBIN TOTAL: 0.8 mg/dL (ref 0.3–1.2)
Bilirubin, Direct: 0.3 mg/dL (ref 0.0–0.5)
Indirect Bilirubin: 0.5 mg/dL (ref 0.3–0.9)
Total Protein: 6.1 g/dL (ref 6.0–8.3)

## 2014-08-27 LAB — BASIC METABOLIC PANEL
Anion gap: 9 (ref 5–15)
BUN: 46 mg/dL — AB (ref 6–23)
CO2: 20 mmol/L (ref 19–32)
CREATININE: 1.45 mg/dL — AB (ref 0.50–1.10)
Calcium: 8.1 mg/dL — ABNORMAL LOW (ref 8.4–10.5)
Chloride: 113 mmol/L — ABNORMAL HIGH (ref 96–112)
GFR calc Af Amer: 45 mL/min — ABNORMAL LOW (ref >90)
GFR, EST NON AFRICAN AMERICAN: 39 mL/min — AB (ref >90)
GLUCOSE: 79 mg/dL (ref 70–99)
POTASSIUM: 4.4 mmol/L (ref 3.5–5.1)
Sodium: 142 mmol/L (ref 135–145)

## 2014-08-27 LAB — APTT: APTT: 35 s (ref 24–37)

## 2014-08-27 LAB — PHOSPHORUS: Phosphorus: 4.2 mg/dL (ref 2.3–4.6)

## 2014-08-27 LAB — MRSA PCR SCREENING: MRSA BY PCR: POSITIVE — AB

## 2014-08-27 LAB — CREATININE, URINE, RANDOM: Creatinine, Urine: 82.55 mg/dL

## 2014-08-27 LAB — MAGNESIUM: Magnesium: 2 mg/dL (ref 1.5–2.5)

## 2014-08-27 LAB — TSH: TSH: 0.181 u[IU]/mL — AB (ref 0.350–4.500)

## 2014-08-27 MED ORDER — SODIUM CHLORIDE 0.9 % IV SOLN
INTRAVENOUS | Status: AC
  Administered 2014-08-27 (×2): via INTRAVENOUS

## 2014-08-27 MED ORDER — CHLORHEXIDINE GLUCONATE CLOTH 2 % EX PADS
6.0000 | MEDICATED_PAD | Freq: Every day | CUTANEOUS | Status: DC
  Administered 2014-08-27 – 2014-08-28 (×2): 6 via TOPICAL

## 2014-08-27 MED ORDER — LEVOTHYROXINE SODIUM 50 MCG PO TABS
275.0000 ug | ORAL_TABLET | Freq: Every day | ORAL | Status: DC
  Administered 2014-08-28: 275 ug via ORAL
  Filled 2014-08-27: qty 2
  Filled 2014-08-27 (×2): qty 1

## 2014-08-27 MED ORDER — LEVOTHYROXINE SODIUM 50 MCG PO TABS: 275.0000 ug | ORAL_TABLET | Freq: Every day | ORAL | Status: DC

## 2014-08-27 MED ORDER — MUPIROCIN 2 % EX OINT
TOPICAL_OINTMENT | CUTANEOUS | Status: AC
  Filled 2014-08-27: qty 22

## 2014-08-27 MED ORDER — MUPIROCIN 2 % EX OINT
1.0000 "application " | TOPICAL_OINTMENT | Freq: Two times a day (BID) | CUTANEOUS | Status: DC
  Administered 2014-08-27 – 2014-08-28 (×3): 1 via NASAL
  Filled 2014-08-27: qty 22

## 2014-08-27 NOTE — Progress Notes (Signed)
UR completed 

## 2014-08-27 NOTE — Progress Notes (Signed)
[facility]  Patient Status: Active (receiving services up to time of hospitalization)  [facility] is providing the following services: RN and PT  If patient discharges after hours, please call [phone].   Consepcion Hearing 08/27/2014, 10:19 AM

## 2014-08-27 NOTE — Progress Notes (Signed)
Patient c/o of tightness of breathing but denies any chest pain.  Put patient on 2L/min O2 and elevated HOB to 60 degrees. BP 120/77, Pulse 89, RR 14, O2Sat=99% at 2L/min via [place]. T=97.7.  +2 edema still noted on BLE.  Explained to patient that it would be easier and comfortable for a CHF patient to sleep with HOB elevated.   Patient was relieved of breathing difficulty with HOB elevated 60 degrees and O2 supplement.

## 2014-08-27 NOTE — Evaluation (Signed)
Physical Therapy Evaluation and Discharge Patient Details Name: Kerri Vasquez MRN: [id number] DOB: 27-Jul-1955 Today's Date: 08/27/2014   History of Present Illness  59 y.o. female admitted with acute kidney injury. Hx of Takotsubo cardiomyopathy, metastatic breast cancer, HTN, anemia of chronic disease, and Osteoarthritis/chronic pain.  Clinical Impression  Patient evaluated by Physical Therapy with no further acute PT needs identified. All education has been completed and the patient has no further questions. Ambulates generally well with a rolling walker for support, HR into 120s. Safely completed stair training today. Demonstrates knowledge of appropriate exercises. States she has been progressing well with her HHPT program and is hopeful she will progress from a RW to cane soon. See below for any follow-up Physial Therapy or equipment needs. PT is signing off. Thank you for this referral.      Follow Up Recommendations Home health PT (Resume HHPT)    Equipment Recommendations  None recommended by PT    Recommendations for Other Services       Precautions / Restrictions Precautions Precautions: None Restrictions Weight Bearing Restrictions: No      Mobility  Bed Mobility               General bed mobility comments: Sitting EOB  Transfers Overall transfer level: Needs assistance Equipment used: Rolling walker (2 wheeled) Transfers: Sit to/from Stand Sit to Stand: Supervision         General transfer comment: Supervision for safety. VC for hand placement, especially when reaching back to sit.  Ambulation/Gait Ambulation/Gait assistance: Supervision Ambulation Distance (Feet): 175 Feet Assistive device: Rolling walker (2 wheeled) Gait Pattern/deviations: Step-through pattern;Trunk flexed;Decreased stance time - right Gait velocity: decreased   General Gait Details: VC intermittently for walker placement for proximity. No loss of balance noted. Cues for  upright posture at times. Mildly antalgic. HR in 120s   Stairs Stairs: Yes Stairs assistance: Min guard Stair Management: One rail Right;Step to pattern;Sideways Number of Stairs: 3 General stair comments: VC for sequencing, educated on sidestepping approach using both hands on single rail. Did not require physical assist.  Wheelchair Mobility    Modified Rankin (Stroke Patients Only)       Balance Overall balance assessment: Needs assistance Sitting-balance support: No upper extremity supported;Feet supported Sitting balance-Leahy Scale: Good     Standing balance support: No upper extremity supported Standing balance-Leahy Scale: Fair                               Pertinent Vitals/Pain Pain Assessment: 0-10 Pain Score:  ("Just feel blah" no value given) Pain Location: all over Pain Descriptors / Indicators:  ("blah") Pain Intervention(s): Monitored during session;Repositioned    Home Living Family/patient expects to be discharged to:: Private residence Living Arrangements: Children Available Help at Discharge: Family;Available 24 hours/day Type of Home: House Home Access: Stairs to enter Entrance Stairs-Rails: Psychiatric nurse of Steps: 4 Home Layout: One level Home Equipment: Cane - single point;Grab bars - tub/shower;Walker - 2 wheels;Bedside commode      Prior Function Level of Independence: Independent with assistive device(s)         Comments: RW for mobility     Hand Dominance   Dominant Hand: Right    Extremity/Trunk Assessment   Upper Extremity Assessment: Defer to OT evaluation           Lower Extremity Assessment:  (BIL LEs edematous)         Communication  Communication: No difficulties  Cognition Arousal/Alertness: Awake/alert Behavior During Therapy: WFL for tasks assessed/performed Overall Cognitive Status: Within Functional Limits for tasks assessed                      General  Comments General comments (skin integrity, edema, etc.): Patient reports daughter has been assisting pt in room with mobility.    Exercises        Assessment/Plan    PT Assessment Patent does not need any further PT services  PT Diagnosis Abnormality of gait;Acute pain   PT Problem List    PT Treatment Interventions     PT Goals (Current goals can be found in the Care Plan section) Acute Rehab PT Goals Patient Stated Goal: go home PT Goal Formulation: All assessment and education complete, DC therapy    Frequency     Barriers to discharge        Co-evaluation               End of Session Equipment Utilized During Treatment: Gait belt Activity Tolerance: Patient tolerated treatment well Patient left: in bed;with call bell/phone within reach;with family/visitor present Nurse Communication: Mobility status    Functional Assessment Tool Used: clinical observation Functional Limitation: Mobility: Walking and moving around Mobility: Walking and Moving Around Current Status (J1884): At least 1 percent but less than 20 percent impaired, limited or restricted Mobility: Walking and Moving Around Goal Status [phone]): At least 1 percent but less than 20 percent impaired, limited or restricted Mobility: Walking and Moving Around Discharge Status [phone]): At least 1 percent but less than 20 percent impaired, limited or restricted    Time: 1093-2355 PT Time Calculation (min) (ACUTE ONLY): 15 min   Charges:   PT Evaluation $Initial PT Evaluation Tier I: 1 Procedure     PT G Codes:   PT G-Codes **NOT FOR INPATIENT CLASS** Functional Assessment Tool Used: clinical observation Functional Limitation: Mobility: Walking and moving around Mobility: Walking and Moving Around Current Status (D3220): At least 1 percent but less than 20 percent impaired, limited or restricted Mobility: Walking and Moving Around Goal Status [phone]): At least 1 percent but less than 20 percent impaired,  limited or restricted Mobility: Walking and Moving Around Discharge Status [phone]): At least 1 percent but less than 20 percent impaired, limited or restricted    Kerri Vasquez 08/27/2014, 4:39 PM Kerri Vasquez, [place]

## 2014-08-27 NOTE — Progress Notes (Signed)
OT Cancellation Note  Patient Details Name: Kerri Vasquez MRN: [id number] DOB: 1956-01-12   Cancelled Treatment:    Reason Eval/Treat Not Completed: OT screened, no needs identified, will sign off. Per PT, pt has no OT needs. Acute OT will sign off at this time. Thank you for this referral. Please re-consult if OT needs arise.   Villa Herb M   Cyndie Chime, OTR/L Occupational Therapist [phone] (pager)  08/27/2014, 3:42 PM

## 2014-08-27 NOTE — Progress Notes (Signed)
Echocardiogram  2D Echocardiogram has been performed.  Kerri Vasquez M 08/27/2014, 12:53 PM

## 2014-08-27 NOTE — Progress Notes (Signed)
Subjective: No acute events overnight. She reports she feels better. No CP. Tolerating diet without nausea/vomiting.  Objective: Vital signs in last 24 hours: Filed Vitals:   08/26/14 1900 08/26/14 2016 08/27/14 0100 08/27/14 0444  BP: 117/66 120/66 115/69 120/77  Pulse: 88 88 80 89  Temp:  97.5 F (36.4 C) 97.2 F (36.2 C) 97.7 F (36.5 C)  TempSrc:  Oral  Oral  Resp: 10 17 13 14   Height:  5\' 6"  (1.676 m)    Weight:  272 lb 9.6 oz (123.651 kg)    SpO2: 100% 100% 100% 99%   Weight change:   Intake/Output Summary (Last 24 hours) at 08/27/14 1108 Last data filed at 08/27/14 0900  Gross per 24 hour  Intake 1006.67 ml  Output    350 ml  Net 656.67 ml   General Apperance: NAD HEENT: Normocephalic, atraumatic, PERRL, EOMI, anicteric sclera Neck: Supple, trachea midline Lungs: Clear to auscultation bilaterally. No wheezes, rhonchi or rales. Breathing comfortably Chest Wall: Nontender, no deformity Heart: Regular rate and rhythm, no murmur/rub/gallop Abdomen: Soft, nontender, nondistended, no rebound/guarding Extremities: 2+ pitting edema to knees Pulses: 2+ throughout Skin: No rashes or lesions Neurologic: Alert and oriented x 3. CNII-XII intact. Normal strength and sensation  Lab Results: Basic Metabolic Panel:  Recent Labs Lab 08/26/14 1617 08/27/14 0410  NA 140 142  K 4.4 4.4  CL 112 113*  CO2 21 20  GLUCOSE 90 79  BUN 50* 46*  CREATININE 1.73* 1.45*  CALCIUM 8.3* 8.1*   Liver Function Tests: No results for input(s): AST, ALT, ALKPHOS, BILITOT, PROT, ALBUMIN in the last 168 hours. No results for input(s): LIPASE, AMYLASE in the last 168 hours.  CBC:  Recent Labs Lab 08/26/14 1617 08/27/14 0410  WBC 6.1 5.1  NEUTROABS  --  2.3  HGB 9.9* 9.2*  HCT 32.1* 28.8*  MCV 100.9* 100.3*  PLT 230 192   Cardiac Enzymes:  Recent Labs Lab 08/27/14 0410  TROPONINI <0.03   CBG:  Recent Labs Lab 08/26/14 1622  GLUCAP 88   Urine Drug Screen: Drugs  of Abuse     Component Value Date/Time   LABOPIA NEG 03/07/2011 1138   COCAINSCRNUR NEG 03/07/2011 1138   LABBENZ NEG 03/07/2011 1138   AMPHETMU NEG 03/07/2011 1138    Urinalysis:  Recent Labs Lab 08/26/14 1850  COLORURINE YELLOW  LABSPEC 1.018  PHURINE 5.0  GLUCOSEU NEGATIVE  HGBUR NEGATIVE  BILIRUBINUR NEGATIVE  KETONESUR NEGATIVE  PROTEINUR NEGATIVE  UROBILINOGEN 0.2  NITRITE NEGATIVE  LEUKOCYTESUR NEGATIVE    Studies/Results: Dg Chest 2 View  08/26/2014   CLINICAL DATA:  59 year old female with a history of transport assure and wheezing.  EXAM: CHEST - 2 VIEW  COMPARISON:  07/13/2014, 07/11/2014  FINDINGS: Low lung volumes accentuating the interstitium.  Persistent elevation of the right hemidiaphragm.  Apical lordotic position.  Linear opacity of the base the right lung.  Atherosclerosis of the aortic arch.  Interval improvement in airspace opacities bilaterally.  No pneumothorax.  No evidence of confluent airspace disease.  No displaced fracture.  IMPRESSION: Interval improvement in the bilateral airspace disease, with no evidence on the current x-ray of acute cardiopulmonary disease.  Persistent elevation of the right hemidiaphragm with basilar atelectasis/ scarring.  Atherosclerosis.  Signed,  Dulcy Fanny. Earleen Newport, DO  [facility]  [facility]   Electronically Signed   By: Corrie Mckusick D.O.   On: 08/26/2014 15:46   Medications: I have reviewed the patient's current  medications. Scheduled Meds: . carvedilol  3.125 mg Oral BID WC  . Chlorhexidine Gluconate Cloth  6 each Topical Q0600  . FLUoxetine  40 mg Oral QAC breakfast  . heparin  5,000 Units Subcutaneous 3 times per day  . levothyroxine  300 mcg Oral QAC breakfast  . mupirocin ointment  1 application Nasal BID  . OxyCODONE  20 mg Oral Q12H  . pregabalin  200 mg Oral BID  . sodium chloride  3 mL Intravenous Q12H   Continuous Infusions: . sodium chloride     PRN  Meds:.nitroGLYCERIN, ondansetron, oxyCODONE-acetaminophen **AND** oxyCODONE, temazepam Assessment/Plan:  Acute kidney injury: Cr down to 1.45 from 1.73 at admission. 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  -Hold home Lasix and K-Dur due to AKI. -Repeat echocardiogram.  Chest pain: Resolved this morning. Troponin negative. -Continue home nitroglycerin glycerin subungual tablet 0.4 mg as needed.  Diarrhea:  -Continue to monitor and consider C. difficile PCR if persistent.  Metastatic breast cancer: Follows with Dr. Marin Olp of oncology at the [facility] with oncology as an outpatient. -Continue home Zofran 8 mg every 8 hours as needed. -Check LFTs  Anemia of chronic disease: continue to monitor.  Hypertension: Continue home Coreg 3.125 mg BID.  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 -Continue home Synthroid 300 mcg.  Osteoarthritis/chronic pain:  -Continue home OxyContin 20 mg every 12 hours. -Continue home Percocet/oxycodone 20-325 mg every 6 hours as needed. -Continue home Lyrica 200 mg twice a day.  Depression/anxiety: Continue home Prozac 40 mg daily and temazepam 15 mg daily at bedtime when necessary.  FEN: Regular diet VTE prophylaxis: subq heparin  Dispo: Disposition is deferred at this time, awaiting improvement of current medical problems.  Anticipated discharge in approximately 1-2 day(s).   The patient does have a current PCP (Pcp Not In System) and does not need an [facility] hospital follow-up appointment after discharge.  The patient does not have transportation limitations that hinder transportation to clinic appointments.  .Services Needed at time of discharge: Y = Yes, Blank = No PT:   OT:   RN:   Equipment:   Other:     LOS: 1 day   Milagros Loll, MD 08/27/2014, 11:08 AM

## 2014-08-28 DIAGNOSIS — E039 Hypothyroidism, unspecified: Secondary | ICD-10-CM

## 2014-08-28 DIAGNOSIS — I5181 Takotsubo syndrome: Secondary | ICD-10-CM

## 2014-08-28 DIAGNOSIS — N179 Acute kidney failure, unspecified: Principal | ICD-10-CM

## 2014-08-28 LAB — RENAL FUNCTION PANEL
Albumin: 1.9 g/dL — ABNORMAL LOW (ref 3.5–5.2)
Anion gap: 7 (ref 5–15)
BUN: 38 mg/dL — AB (ref 6–23)
CHLORIDE: 114 mmol/L — AB (ref 96–112)
CO2: 21 mmol/L (ref 19–32)
Calcium: 7.8 mg/dL — ABNORMAL LOW (ref 8.4–10.5)
Creatinine, Ser: 1.33 mg/dL — ABNORMAL HIGH (ref 0.50–1.10)
GFR calc Af Amer: 50 mL/min — ABNORMAL LOW (ref >90)
GFR, EST NON AFRICAN AMERICAN: 43 mL/min — AB (ref >90)
Glucose, Bld: 92 mg/dL (ref 70–99)
PHOSPHORUS: 3.6 mg/dL (ref 2.3–4.6)
POTASSIUM: 4.3 mmol/L (ref 3.5–5.1)
SODIUM: 142 mmol/L (ref 135–145)

## 2014-08-28 LAB — CBC
HEMATOCRIT: 27 % — AB (ref 36.0–46.0)
Hemoglobin: 8.5 g/dL — ABNORMAL LOW (ref 12.0–15.0)
MCH: 31.6 pg (ref 26.0–34.0)
MCHC: 31.5 g/dL (ref 30.0–36.0)
MCV: 100.4 fL — AB (ref 78.0–100.0)
PLATELETS: 180 10*3/uL (ref 150–400)
RBC: 2.69 MIL/uL — ABNORMAL LOW (ref 3.87–5.11)
RDW: 14.8 % (ref 11.5–15.5)
WBC: 5 10*3/uL (ref 4.0–10.5)

## 2014-08-28 LAB — HIV ANTIBODY (ROUTINE TESTING W REFLEX): HIV SCREEN 4TH GENERATION: NONREACTIVE

## 2014-08-28 LAB — UREA NITROGEN, URINE: Urea Nitrogen, Ur: 769 mg/dL

## 2014-08-28 MED ORDER — WITCH HAZEL-GLYCERIN EX PADS
MEDICATED_PAD | CUTANEOUS | Status: DC | PRN
Start: 2014-08-28 — End: 2014-08-28
  Administered 2014-08-28: 1 via TOPICAL
  Filled 2014-08-28 (×2): qty 100

## 2014-08-28 MED ORDER — LEVOTHYROXINE SODIUM 25 MCG PO TABS: 275.0000 ug | ORAL_TABLET | Freq: Every day | ORAL | Status: DC

## 2014-08-28 NOTE — Progress Notes (Signed)
Audrea Muscat Mahjouba to be D/C'd Home per MD order.  Discussed with the patient and all questions fully answered.  VSS, Skin clean, dry and intact without evidence of skin break down, no evidence of skin tears noted. IV catheter discontinued intact. Site without signs and symptoms of complications. Dressing and pressure applied.  An After Visit Summary was printed and given to the patient. Prescription called in to patient's pharmacy.  D/c education completed with patient/family including follow up instructions, medication list, d/c activities limitations if indicated, with other d/c instructions as indicated by MD - patient able to verbalize understanding, all questions fully answered.   Patient instructed to return to ED, call 911, or call MD for any changes in condition.   Patient escorted via [place], and D/C home via private auto.  Micki Riley 08/28/2014 1:51 PM

## 2014-08-28 NOTE — Progress Notes (Signed)
Subjective: No acute events overnight. Reports feeling better. BM last night was soft. Denies diarrhea. No CP/SOB. Urinating without difficulty.  Objective: Vital signs in last 24 hours: Filed Vitals:   08/27/14 1000 08/27/14 1455 08/27/14 2239 08/28/14 0537  BP: 118/62 120/78 104/67 104/65  Pulse: 88 94 82 82  Temp: 97.6 F (36.4 C) 98.6 F (37 C) 98.5 F (36.9 C) 97.7 F (36.5 C)  TempSrc: Oral Oral Oral Oral  Resp: 16 20 18 18   Height:      Weight:      SpO2: 100% 99% 100% 100%   Weight change:   Intake/Output Summary (Last 24 hours) at 08/28/14 1134 Last data filed at 08/28/14 0933  Gross per 24 hour  Intake 2144.67 ml  Output      0 ml  Net 2144.67 ml   General Apperance: NAD HEENT: Normocephalic, atraumatic, PERRL, EOMI, anicteric sclera Neck: Supple, trachea midline Lungs: Clear to auscultation bilaterally. No wheezes, rhonchi or rales. Breathing comfortably Chest Wall: Nontender, no deformity Heart: Regular rate and rhythm, no murmur/rub/gallop Abdomen: Soft, nontender, nondistended, no rebound/guarding Extremities: 2+ pitting edema to knees Pulses: 2+ throughout Skin: No rashes or lesions Neurologic: Alert and oriented x 3. CNII-XII intact. Normal strength and sensation  Lab Results: Basic Metabolic Panel:  Recent Labs Lab 08/27/14 0410 08/27/14 1210 08/28/14 0336  NA 142  --  142  K 4.4  --  4.3  CL 113*  --  114*  CO2 20  --  21  GLUCOSE 79  --  92  BUN 46*  --  38*  CREATININE 1.45*  --  1.33*  CALCIUM 8.1*  --  7.8*  MG  --  2.0  --   PHOS  --  4.2 3.6   Liver Function Tests:  Recent Labs Lab 08/27/14 1210 08/28/14 0336  AST 19  --   ALT 15  --   ALKPHOS 122*  --   BILITOT 0.8  --   PROT 6.1  --   ALBUMIN 2.3* 1.9*   No results for input(s): LIPASE, AMYLASE in the last 168 hours.  CBC:  Recent Labs Lab 08/27/14 0410 08/28/14 0336  WBC 5.1 5.0  NEUTROABS 2.3  --   HGB 9.2* 8.5*  HCT 28.8* 27.0*  MCV 100.3* 100.4*    PLT 192 180   Cardiac Enzymes:  Recent Labs Lab 08/27/14 0410 08/27/14 1210  TROPONINI <0.03 0.03   CBG:  Recent Labs Lab 08/26/14 1622  GLUCAP 88   Urine Drug Screen: Drugs of Abuse     Component Value Date/Time   LABOPIA NEG 03/07/2011 1138   COCAINSCRNUR NEG 03/07/2011 1138   LABBENZ NEG 03/07/2011 1138   AMPHETMU NEG 03/07/2011 1138    Urinalysis:  Recent Labs Lab 08/26/14 1850  COLORURINE YELLOW  LABSPEC 1.018  PHURINE 5.0  GLUCOSEU NEGATIVE  HGBUR NEGATIVE  BILIRUBINUR NEGATIVE  KETONESUR NEGATIVE  PROTEINUR NEGATIVE  UROBILINOGEN 0.2  NITRITE NEGATIVE  LEUKOCYTESUR NEGATIVE    Studies/Results: Dg Chest 2 View  08/26/2014   CLINICAL DATA:  59 year old female with a history of transport assure and wheezing.  EXAM: CHEST - 2 VIEW  COMPARISON:  07/13/2014, 07/11/2014  FINDINGS: Low lung volumes accentuating the interstitium.  Persistent elevation of the right hemidiaphragm.  Apical lordotic position.  Linear opacity of the base the right lung.  Atherosclerosis of the aortic arch.  Interval improvement in airspace opacities bilaterally.  No pneumothorax.  No evidence of confluent airspace disease.  No  displaced fracture.  IMPRESSION: Interval improvement in the bilateral airspace disease, with no evidence on the current x-ray of acute cardiopulmonary disease.  Persistent elevation of the right hemidiaphragm with basilar atelectasis/ scarring.  Atherosclerosis.  Signed,  Dulcy Fanny. Earleen Newport, DO  [facility]  [facility]   Electronically Signed   By: Corrie Mckusick D.O.   On: 08/26/2014 15:46   Medications: I have reviewed the patient's current medications. Scheduled Meds: . carvedilol  3.125 mg Oral BID WC  . Chlorhexidine Gluconate Cloth  6 each Topical Q0600  . FLUoxetine  40 mg Oral QAC breakfast  . heparin  5,000 Units Subcutaneous 3 times per day  . levothyroxine  275 mcg Oral QAC breakfast  . mupirocin ointment   1 application Nasal BID  . OxyCODONE  20 mg Oral Q12H  . pregabalin  200 mg Oral BID  . sodium chloride  3 mL Intravenous Q12H   Continuous Infusions:   PRN Meds:.nitroGLYCERIN, ondansetron, oxyCODONE-acetaminophen **AND** oxyCODONE, temazepam, witch hazel-glycerin Assessment/Plan:  Acute kidney injury: Cr down to 1.33 from 1.73 at admission. 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 Repeat Echo 08/28/2014 with LV EF 55-60% and grade 1 diastolic dysfunction.  -Hold home Lasix and K-Dur due to AKI.  Chest pain: Resolved. Troponin negative. -Continue home nitroglycerin glycerin subungual tablet 0.4 mg as needed.  Diarrhea: Resolved -Continue to monitor and consider C. difficile PCR if persistent.  Metastatic breast cancer: Follows with Dr. Marin Olp of oncology at the [facility]. Albumin low at 1.9. Likely contributing to peripheral edema.  -Follow-up with oncology as an outpatient. -Continue home Zofran 8 mg every 8 hours as needed. -Ted hose  Anemia of chronic disease: continue to monitor.  Hypertension: Continue home Coreg 3.125 mg BID.  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 TSH 0.181 -Decrease home Synthroid from 300 mcg to 275 mcg. Recheck TSH in 6 weeks.  Osteoarthritis/chronic pain:  -Continue home OxyContin 20 mg every 12 hours. -Continue home Percocet/oxycodone 20-325 mg every 6 hours as needed. -Continue home Lyrica 200 mg twice a day.  Depression/anxiety: Continue home Prozac 40 mg daily and temazepam 15 mg daily at bedtime when necessary.  FEN: Regular diet VTE prophylaxis: subq heparin  Dispo: Likely home today  The patient does have a current PCP (Pcp Not In System) and does not need an [facility] hospital follow-up appointment after discharge.  The patient does  not have transportation limitations that hinder transportation to clinic appointments.  .Services Needed at time of discharge: Y = Yes, Blank = No PT:   OT:   RN:   Equipment:   Other:     LOS: 2 days   Milagros Loll, MD 08/28/2014, 11:34 AM

## 2014-08-28 NOTE — Discharge Instructions (Signed)
Your TSH level was 0.181. Your dose of Synthroid was decreased from 300 mcg to 275 mcg. Recheck TSH in 6 weeks.   Edema Edema is an abnormal buildup of fluids. It is more common in your legs and thighs. Painless swelling of the feet and ankles is more likely as a person ages. It also is common in looser skin, like around your eyes. HOME CARE   Keep the affected body part above the level of the heart while lying down.  Do not sit still or stand for a long time.  Do not put anything right under your knees when you lie down.  Do not wear tight clothes on your upper legs.  Exercise your legs to help the puffiness (swelling) go down.  Wear elastic bandages or support stockings as told by your doctor.  A low-salt diet may help lessen the puffiness.  Only take medicine as told by your doctor. GET HELP IF:  Treatment is not working.  You have heart, liver, or kidney disease and notice that your skin looks puffy or shiny.  You have puffiness in your legs that does not get better when you raise your legs.  You have sudden weight gain for no reason. GET HELP RIGHT AWAY IF:   You have shortness of breath or chest pain.  You cannot breathe when you lie down.  You have pain, redness, or warmth in the areas that are puffy.  You have heart, liver, or kidney disease and get edema all of a sudden.  You have a fever and your symptoms get worse all of a sudden. MAKE SURE YOU:   Understand these instructions.  Will watch your condition.  Will get help right away if you are not doing well or get worse. Document Released: 10/04/2007 Document Revised: 04/22/2013 Document Reviewed: 02/07/2013 [facility] Patient Information 2015 [place], [place]. This information is not intended to replace advice given to you by your health care provider. Make sure you discuss any questions you have with your health care provider.

## 2014-08-28 NOTE — Progress Notes (Signed)
Patient seen and examined. Case d/w residents in detail. I agree with findings and plan as documented in Dr. Marjory Sneddon note.  Patient feels well this AM. No diarrhea, no fevers.   Still with 2 + LE edema likely secondary to hypoalbuminemia. Would c/w compression stockings  AKI is resolving. Would continue to hold lasix for now. Will need repeat BMP with PCP as an outpatient prior to re initiation of lasix.  Patient with history of Takotsubo cardiomyopathy- Repeat EF now 55-60% (normalized). Outpatient f/u with cardio  Outpatient follow up with Onc for metastatic breast ca  Patient stable for d/c home today

## 2014-08-29 LAB — PREALBUMIN: PREALBUMIN: 9 mg/dL — AB (ref 17–34)

## 2014-08-31 NOTE — Discharge Summary (Signed)
Name: Kerri Vasquez MRN: [id number] DOB: [birth year] 59 y.o. PCP: Pcp Not In System  Date of Admission: 08/26/2014  3:49 PM Date of Discharge: 08/28/2014 Attending Physician: Aldine Contes, MD  Discharge Diagnosis: Principal Problem:   AKI (acute kidney injury) Active Problems:   Depression   Anemia   Essential hypertension   Hypothyroidism   Chronic pain syndrome   Chronic systolic heart failure   Dehydration   Peripheral edema  Discharge Medications:   Medication List    STOP taking these medications        furosemide 20 MG tablet  Commonly known as:  LASIX     potassium chloride SA 20 MEQ tablet  Commonly known as:  K-DUR,KLOR-CON      TAKE these medications        acetaminophen 500 MG tablet  Commonly known as:  TYLENOL  Take 1,000 mg by mouth every 4 (four) hours as needed for moderate pain.     carvedilol 3.125 MG tablet  Commonly known as:  COREG  Take 1 tablet (3.125 mg total) by mouth 2 (two) times daily with a meal.     feeding supplement (ENSURE COMPLETE) Liqd  Take 237 mLs by mouth daily at 3 pm.     FLUoxetine 40 MG capsule  Commonly known as:  PROZAC  Take 40 mg by mouth daily before breakfast.     levothyroxine 25 MCG tablet  Commonly known as:  SYNTHROID, LEVOTHROID  Take 11 tablets (275 mcg total) by mouth daily before breakfast.     LORazepam 0.5 MG tablet  Commonly known as:  ATIVAN  PLACE 1 TABLET UNDER THE TONGUE EVERY 6 HOURS AS NEEDED FOR NAUSEA     methocarbamol 750 MG tablet  Commonly known as:  ROBAXIN  Take 750 mg by mouth 4 (four) times daily.     nitroGLYCERIN 0.4 MG SL tablet  Commonly known as:  NITROSTAT  Place 1 tablet (0.4 mg total) under the tongue every 5 (five) minutes as needed for chest pain.     ondansetron 8 MG tablet  Commonly known as:  ZOFRAN  Take 1 tablet (8 mg total) by mouth every 8 (eight) hours as needed for nausea or vomiting.     oxyCODONE 5 MG immediate release tablet  Commonly known  as:  Oxy IR/ROXICODONE  Take 5 mg by mouth every 4 (four) hours as needed (For pain.).     OxyCODONE 20 mg T12a 12 hr tablet  Commonly known as:  OXYCONTIN  Take 20 mg by mouth every 12 (twelve) hours.     pregabalin 200 MG capsule  Commonly known as:  LYRICA  Take 1 capsule (200 mg total) by mouth 2 (two) times daily.     promethazine 25 MG tablet  Commonly known as:  PHENERGAN  TAKE 1 TABLET BY MOUTH THREE TIMES DAILY AS NEEDED FOR NAUSEA     spironolactone 25 MG tablet  Commonly known as:  ALDACTONE  Take 1 tablet (25 mg total) by mouth daily.     temazepam 15 MG capsule  Commonly known as:  RESTORIL  15 mg po qhs prn for sleep.  If needed, can take an additional 15mg .     tiZANidine 4 MG tablet  Commonly known as:  ZANAFLEX  Take 4 mg by mouth every 6 (six) hours as needed for muscle spasms.        Disposition and follow-up:   Kerri Vasquez was discharged from [facility]  in Stable condition.  At the hospital follow up visit please address:  1.  Acute kidney injury: Creatinine up from baseline of 0.9 most likely secondary to overdiuresis from using more than prescribed dose of Lasix. Her diuretics were discontinued. Her albumin was found to be low at 1.9 which likely contributes to her peripheral edema. She will need to follow up with cardiology to assess need for diuretic. She was discharged with TED hose. Takotsubo cardiomyopathy: Repeat Echo 08/28/2014 with LV EF 55-60% and grade 1 diastolic dysfunction. She will need to follow up with cardiology. Hypothyroidism: TSH was 0.181 so her home Synthroid was decreased to 275 mcg. Recheck TSH in 6 weeks.  2.  Labs / imaging needed at time of follow-up: BMP. TSH in 6 weeks  3.  Pending labs/ test needing follow-up: None  Follow-up Appointments: Follow-up Information    Follow up with Primary Care Physician. Schedule an appointment as soon as possible for a visit in 1 week.   Why:  for hospital follow up    Contact information:   You may call [facility] [phone] for assistance in finding a new PCP.      Follow up with Volanda Napoleon, MD On 09/03/2014.   Specialty:  Oncology   Why:  1:30PM for follow up   Contact information:   [place], [address] [address] [phone]       Discharge Instructions: Discharge Instructions    Call MD for:  difficulty breathing, headache or visual disturbances    Complete by:  As directed      Call MD for:  persistant dizziness or light-headedness    Complete by:  As directed      Call MD for:  persistant nausea and vomiting    Complete by:  As directed      Call MD for:  severe uncontrolled pain    Complete by:  As directed      Call MD for:  temperature >100.4    Complete by:  As directed      Diet - low sodium heart healthy    Complete by:  As directed      Increase activity slowly    Complete by:  As directed            Consultations: none  Procedures Performed:  Dg Chest 2 View  08/26/2014   CLINICAL DATA:  59 year old female with a history of transport assure and wheezing.  EXAM: CHEST - 2 VIEW  COMPARISON:  07/13/2014, 07/11/2014  FINDINGS: Low lung volumes accentuating the interstitium.  Persistent elevation of the right hemidiaphragm.  Apical lordotic position.  Linear opacity of the base the right lung.  Atherosclerosis of the aortic arch.  Interval improvement in airspace opacities bilaterally.  No pneumothorax.  No evidence of confluent airspace disease.  No displaced fracture.  IMPRESSION: Interval improvement in the bilateral airspace disease, with no evidence on the current x-ray of acute cardiopulmonary disease.  Persistent elevation of the right hemidiaphragm with basilar atelectasis/ scarring.  Atherosclerosis.  Signed,  Dulcy Fanny. Earleen Newport, DO  [facility]  [facility]   Electronically Signed   By: Corrie Mckusick D.O.   On: 08/26/2014 15:46    2D Echo:    Study Conclusions  - Left ventricle: The cavity size was normal. Wall thickness was normal. Systolic function was normal. The estimated ejection fraction was in the range of 55% to 60%. Wall motion was normal; there were  no regional wall motion abnormalities. There was an increased relative contribution of atrial contraction to ventricular filling. Doppler parameters are consistent with abnormal left ventricular relaxation (grade 1 diastolic dysfunction). - Aortic valve: Trileaflet; mildly calcified leaflets. - Mitral valve: There was trivial regurgitation. - Right ventricle: Poorly visualized. - Right atrium: The RA is not very well visualized but in the apical 4 chamber view there is some question of extrnisinc compression of the RA. Consider Chest CT angion for further evaluation.  Transthoracic echocardiography. M-mode, complete 2D, spectral Doppler, and color Doppler. Birthdate: Patient birthdate: [birth year]. Age: Patient is 59 yr old. Sex: Gender: female. BMI: 43.9 kg/m^2. Blood pressure:   120/77 Patient status: Inpatient. Study date: Study date: 08/27/2014. Study time: 12:03 PM. Location: Bedside.   Admission HPI: Kerri Vasquez is a 59 year old woman with history of depression, morbid obesity status post gastric bypass, breast cancer with metastases to liver, Takotsubo cardiomyopathy, hypertension, hypothyroidism, osteoarthritis, iron deficiency anemia, and GERD presenting with shortness of breath. The patient was admitted last month for ablation of the liver lesion, and she developed acute kidney injury, NSTEMI, and a CHF exacerbation. Left heart catheterization was performed on 07/14/2014 and showed minimal coronary artery disease but concern for Takotsubo cardiomyopathy. She was aggressively diuresed, and she was discharged home on Coreg, losartan, and spironolactone. She saw cardiology at follow-up on 08/13/2014 who increased her  spironolactone dose, stopped her losartan, and started her on Lasix 20 mg daily when necessary. The patient reports increasing lower extremity swelling along with dyspnea on exertion since then, which has worsened over the past week. Her daughter has increased her Lasix dose by 20 mg each day, and gave her Lasix 80 mg today without good urine output. She reports gaining 14 pounds since yesterday. She reports intermittent chest pain that has responded to her home nitro. She describes the chest pain as heaviness centered in her chest with occasional radiation to her left shoulder. She denies chest pain currently. She reports bilateral leg pain associated with the swelling. She thinks the swelling and pain is worse in her R leg. She also reports intermittent diarrhea since her previous hospitalization that has been worse over the past few days with 5-6 bowel movements per day.  In the ER, her vital signs were stable, but she was noted to have acute kidney injury. No treatment was initiated and she was admitted.  Hospital Course by problem list:   1. Acute kidney injury: Creatinine up from baseline of 0.9 most likely secondary to overdiuresis from using more than prescribed dose of Lasix. Urinalysis unremarkable. Potentially worsened by diarrhea, which may have contributed to her dehydration. She was admitted to [facility] floor. She was started on IVF and her diuretics were held. The following morning her Cr was down to 1.45 from 1.73 at admission. Prior to discharge her Cr was 1.33. Her diuretics were discontinued. Her albumin was found to be low at 1.9 which likely contributes to her peripheral edema. She will need to follow up with cardiology to assess need for diuretic. She was discharged with TED hose.  2. Takotsubo cardiomyopathy: BNP significantly improved from prior, only slightly elevated. Echocardiogram in March 2016 with ejection fraction of 20-25% with severe hypokinesis of the inferior  myocardium. No active cardiopulmonary disease on chest x-ray. Weight apparently up 34 pounds since last measurement. Repeat Echo 08/28/2014 with LV EF 55-60% and grade 1 diastolic dysfunction. She will need to follow up with cardiology.  3. Chest pain: Chest discomfort that is  relieved by nitroglycerin. Initial EKG and troponin unremarkable with no chest pain currently. No further episodes. Troponin remained negative. Continued home nitroglycerin glycerin subungual tablet 0.4 mg as needed.  4. Diarrhea: Intermittent over last month. No recent antibiotic exposure. Palbociclib can cause diarrhea, but she has not taken this recently. No further episodes.  5. Metastatic breast cancer: Follows with Dr. Marin Olp of oncology at the [facility]. She is currently on palbociclib and fluvestrant, and she has received approximately a month ago prior to her previous admission. She is scheduled to see oncology on 09/03/2014 to determine if she should continue these therapies.  6. Anemia of chronic disease: Hemoglobin stable from admission one month ago. Last anemia panel in February 2016 consistent with anemia of chronic disease. History of B-12 and iron deficiency secondary to gastric bypass, and she receives vitamin B-12 IM monthly and IV iron as indicated.  7. Hypertension: Continued home Coreg 3.125 mg BID.  8. Hypothyroidism: Status post hemithyroidectomy for Hurthle cell tumor.Continued home Synthroid 300 mcg. Her TSH was 0.181 so her home Synthroid was decreased to 275 mcg. Recheck TSH in 6 weeks.  9. Osteoarthritis/chronic pain: Reports pain in her back that is chronic along with occasional muscle spasms in her lower extremities for which she takes Robaxin or tizanidine. However, she does not report taking these medications recently. Reports following with a pain clinic for pain and taking OxyContin along with Percocet/oxycodone 20-325 mg every 4 hours as needed, trying to cut back to every 5 or  6 hours. Continued home OxyContin 20 mg every 12 hours, home Percocet/oxycodone 20-325 mg every 6 hours as needed, and home Lyrica 200 mg twice a day.  10. Depression/anxiety: Continue home Prozac 40 mg daily and temazepam 15 mg daily at bedtime when necessary.  Discharge Vitals:   BP 104/65 mmHg  Pulse 82  Temp(Src) 97.7 F (36.5 C) (Oral)  Resp 18  Ht 5\' 6"  (1.676 m)  Wt 272 lb 9.6 oz (123.651 kg)  BMI 44.02 kg/m2  SpO2 100%  LMP 08/30/2006  Discharge Labs:  Basic Metabolic Panel:  Last Labs      Recent Labs Lab 08/27/14 0410 08/27/14 1210 08/28/14 0336  NA 142 --  142  K 4.4 --  4.3  CL 113* --  114*  CO2 20 --  21  GLUCOSE 79 --  92  BUN 46* --  38*  CREATININE 1.45* --  1.33*  CALCIUM 8.1* --  7.8*  MG --  2.0 --   PHOS --  4.2 3.6     Liver Function Tests:  Last Labs      Recent Labs Lab 08/27/14 1210 08/28/14 0336  AST 19 --   ALT 15 --   ALKPHOS 122* --   BILITOT 0.8 --   PROT 6.1 --   ALBUMIN 2.3* 1.9*      Last Labs     No results for input(s): LIPASE, AMYLASE in the last 168 hours.    CBC:  Last Labs      Recent Labs Lab 08/27/14 0410 08/28/14 0336  WBC 5.1 5.0  NEUTROABS 2.3 --   HGB 9.2* 8.5*  HCT 28.8* 27.0*  MCV 100.3* 100.4*  PLT 192 180     Cardiac Enzymes:  Last Labs      Recent Labs Lab 08/27/14 0410 08/27/14 1210  TROPONINI <0.03 0.03     CBG:  Last Labs      Recent Labs Lab 08/26/14 1622  GLUCAP 88  Urine Drug Screen: Drugs of Abuse   Labs (Brief)       Component Value Date/Time   LABOPIA NEG 03/07/2011 1138   COCAINSCRNUR NEG 03/07/2011 1138   LABBENZ NEG 03/07/2011 1138   AMPHETMU NEG 03/07/2011 1138      Urinalysis:  Last Labs      Recent Labs Lab 08/26/14 1850  COLORURINE YELLOW  LABSPEC 1.018  PHURINE 5.0  GLUCOSEU NEGATIVE  HGBUR  NEGATIVE  BILIRUBINUR NEGATIVE  KETONESUR NEGATIVE  PROTEINUR NEGATIVE  UROBILINOGEN 0.2  NITRITE NEGATIVE  LEUKOCYTESUR NEGATIVE          Signed: Milagros Loll, MD 08/31/2014, 11:08 AM    Services Ordered on Discharge: None Equipment Ordered on Discharge: None

## 2014-08-31 NOTE — Progress Notes (Deleted)
Name: Kerri Vasquez MRN: [id number] DOB: 1956/02/10 59 y.o. PCP: Pcp Not In System  Date of Admission: 08/26/2014  3:49 PM Date of Discharge: 08/28/2014 Attending Physician: Aldine Contes, MD  Discharge Diagnosis: Principal Problem:   AKI (acute kidney injury) Active Problems:   Depression   Anemia   Essential hypertension   Hypothyroidism   Chronic pain syndrome   Chronic systolic heart failure   Dehydration   Peripheral edema  Discharge Medications:   Medication List    STOP taking these medications        furosemide 20 MG tablet  Commonly known as:  LASIX     potassium chloride SA 20 MEQ tablet  Commonly known as:  K-DUR,KLOR-CON      TAKE these medications        acetaminophen 500 MG tablet  Commonly known as:  TYLENOL  Take 1,000 mg by mouth every 4 (four) hours as needed for moderate pain.     carvedilol 3.125 MG tablet  Commonly known as:  COREG  Take 1 tablet (3.125 mg total) by mouth 2 (two) times daily with a meal.     feeding supplement (ENSURE COMPLETE) Liqd  Take 237 mLs by mouth daily at 3 pm.     FLUoxetine 40 MG capsule  Commonly known as:  PROZAC  Take 40 mg by mouth daily before breakfast.     levothyroxine 25 MCG tablet  Commonly known as:  SYNTHROID, LEVOTHROID  Take 11 tablets (275 mcg total) by mouth daily before breakfast.     LORazepam 0.5 MG tablet  Commonly known as:  ATIVAN  PLACE 1 TABLET UNDER THE TONGUE EVERY 6 HOURS AS NEEDED FOR NAUSEA     methocarbamol 750 MG tablet  Commonly known as:  ROBAXIN  Take 750 mg by mouth 4 (four) times daily.     nitroGLYCERIN 0.4 MG SL tablet  Commonly known as:  NITROSTAT  Place 1 tablet (0.4 mg total) under the tongue every 5 (five) minutes as needed for chest pain.     ondansetron 8 MG tablet  Commonly known as:  ZOFRAN  Take 1 tablet (8 mg total) by mouth every 8 (eight) hours as needed for nausea or vomiting.     oxyCODONE 5 MG immediate release tablet  Commonly known  as:  Oxy IR/ROXICODONE  Take 5 mg by mouth every 4 (four) hours as needed (For pain.).     OxyCODONE 20 mg T12a 12 hr tablet  Commonly known as:  OXYCONTIN  Take 20 mg by mouth every 12 (twelve) hours.     pregabalin 200 MG capsule  Commonly known as:  LYRICA  Take 1 capsule (200 mg total) by mouth 2 (two) times daily.     promethazine 25 MG tablet  Commonly known as:  PHENERGAN  TAKE 1 TABLET BY MOUTH THREE TIMES DAILY AS NEEDED FOR NAUSEA     spironolactone 25 MG tablet  Commonly known as:  ALDACTONE  Take 1 tablet (25 mg total) by mouth daily.     temazepam 15 MG capsule  Commonly known as:  RESTORIL  15 mg po qhs prn for sleep.  If needed, can take an additional 15mg .     tiZANidine 4 MG tablet  Commonly known as:  ZANAFLEX  Take 4 mg by mouth every 6 (six) hours as needed for muscle spasms.        Disposition and follow-up:   Kerri Vasquez was discharged from [facility]  in Stable condition.  At the hospital follow up visit please address:  1.  Acute kidney injury: Creatinine up from baseline of 0.9 most likely secondary to overdiuresis from using more than prescribed dose of Lasix. Her diuretics were discontinued. Her albumin was found to be low at 1.9 which likely contributes to her peripheral edema. She will need to follow up with cardiology to assess need for diuretic. She was discharged with TED hose. Takotsubo cardiomyopathy: Repeat Echo 08/28/2014 with LV EF 55-60% and grade 1 diastolic dysfunction. She will need to follow up with cardiology. Hypothyroidism: TSH was 0.181 so her home Synthroid was decreased to 275 mcg. Recheck TSH in 6 weeks.  2.  Labs / imaging needed at time of follow-up: BMP. TSH in 6 weeks  3.  Pending labs/ test needing follow-up: None  Follow-up Appointments:     Follow-up Information    Follow up with Primary Care Physician. Schedule an appointment as soon as possible for a visit in 1 week.   Why:  for hospital  follow up   Contact information:   You may call [facility] [phone] for assistance in finding a new PCP.      Follow up with Volanda Napoleon, MD On 09/03/2014.   Specialty:  Oncology   Why:  1:30PM for follow up   Contact information:   [place], [place] [address] [phone]       Discharge Instructions: Discharge Instructions    Call MD for:  difficulty breathing, headache or visual disturbances    Complete by:  As directed      Call MD for:  persistant dizziness or light-headedness    Complete by:  As directed      Call MD for:  persistant nausea and vomiting    Complete by:  As directed      Call MD for:  severe uncontrolled pain    Complete by:  As directed      Call MD for:  temperature >100.4    Complete by:  As directed      Diet - low sodium heart healthy    Complete by:  As directed      Increase activity slowly    Complete by:  As directed            Consultations: none  Procedures Performed:  Dg Chest 2 View  08/26/2014   CLINICAL DATA:  59 year old female with a history of transport assure and wheezing.  EXAM: CHEST - 2 VIEW  COMPARISON:  07/13/2014, 07/11/2014  FINDINGS: Low lung volumes accentuating the interstitium.  Persistent elevation of the right hemidiaphragm.  Apical lordotic position.  Linear opacity of the base the right lung.  Atherosclerosis of the aortic arch.  Interval improvement in airspace opacities bilaterally.  No pneumothorax.  No evidence of confluent airspace disease.  No displaced fracture.  IMPRESSION: Interval improvement in the bilateral airspace disease, with no evidence on the current x-ray of acute cardiopulmonary disease.  Persistent elevation of the right hemidiaphragm with basilar atelectasis/ scarring.  Atherosclerosis.  Signed,  Dulcy Fanny. Earleen Newport, DO  [facility]  [facility]   Electronically Signed   By: Corrie Mckusick D.O.   On: 08/26/2014 15:46    2D  Echo:  Study Conclusions  - Left ventricle: The cavity size was normal. Wall thickness was normal. Systolic function was normal. The estimated ejection fraction was in the range of 55% to 60%. Wall motion was  normal; there were no regional wall motion abnormalities. There was an increased relative contribution of atrial contraction to ventricular filling. Doppler parameters are consistent with abnormal left ventricular relaxation (grade 1 diastolic dysfunction). - Aortic valve: Trileaflet; mildly calcified leaflets. - Mitral valve: There was trivial regurgitation. - Right ventricle: Poorly visualized. - Right atrium: The RA is not very well visualized but in the apical 4 chamber view there is some question of extrnisinc compression of the RA. Consider Chest CT angion for further evaluation.  Transthoracic echocardiography. M-mode, complete 2D, spectral Doppler, and color Doppler. Birthdate: Patient birthdate: 29-Jan-1956. Age: Patient is 59 yr old. Sex: Gender: female. BMI: 43.9 kg/m^2. Blood pressure:   120/77 Patient status: Inpatient. Study date: Study date: 08/27/2014. Study time: 12:03 PM. Location: Bedside.   Admission HPI: Kerri Vasquez is a 59 year old woman with history of depression, morbid obesity status post gastric bypass, breast cancer with metastases to liver, Takotsubo cardiomyopathy, hypertension, hypothyroidism, osteoarthritis, iron deficiency anemia, and GERD presenting with shortness of breath. The patient was admitted last month for ablation of the liver lesion, and she developed acute kidney injury, NSTEMI, and a CHF exacerbation. Left heart catheterization was performed on 07/14/2014 and showed minimal coronary artery disease but concern for Takotsubo cardiomyopathy. She was aggressively diuresed, and she was discharged home on Coreg, losartan, and spironolactone. She saw cardiology at follow-up on 08/13/2014 who increased her  spironolactone dose, stopped her losartan, and started her on Lasix 20 mg daily when necessary. The patient reports increasing lower extremity swelling along with dyspnea on exertion since then, which has worsened over the past week. Her daughter has increased her Lasix dose by 20 mg each day, and gave her Lasix 80 mg today without good urine output. She reports gaining 14 pounds since yesterday. She reports intermittent chest pain that has responded to her home nitro. She describes the chest pain as heaviness centered in her chest with occasional radiation to her left shoulder. She denies chest pain currently. She reports bilateral leg pain associated with the swelling. She thinks the swelling and pain is worse in her R leg. She also reports intermittent diarrhea since her previous hospitalization that has been worse over the past few days with 5-6 bowel movements per day.  In the ER, her vital signs were stable, but she was noted to have acute kidney injury. No treatment was initiated and she was admitted.  Hospital Course by problem list:   1. Acute kidney injury: Creatinine up from baseline of 0.9 most likely secondary to overdiuresis from using more than prescribed dose of Lasix. Urinalysis unremarkable. Potentially worsened by diarrhea, which may have contributed to her dehydration. She was admitted to [facility] floor. She was started on IVF and her diuretics were held. The following morning her Cr was down to 1.45 from 1.73 at admission. Prior to discharge her Cr was 1.33. Her diuretics were discontinued. Her albumin was found to be low at 1.9 which likely contributes to her peripheral edema. She will need to follow up with cardiology to assess need for diuretic. She was discharged with TED hose.  2. Takotsubo cardiomyopathy: BNP significantly improved from prior, only slightly elevated. Echocardiogram in March 2016 with ejection fraction of 20-25% with severe hypokinesis of the inferior  myocardium. No active cardiopulmonary disease on chest x-ray. Weight apparently up 34 pounds since last measurement. Repeat Echo 08/28/2014 with LV EF 55-60% and grade 1 diastolic dysfunction. She will need to follow up with cardiology.  3. Chest pain: Chest  discomfort that is relieved by nitroglycerin. Initial EKG and troponin unremarkable with no chest pain currently. No further episodes. Troponin remained negative. Continued home nitroglycerin glycerin subungual tablet 0.4 mg as needed.  4. Diarrhea: Intermittent over last month. No recent antibiotic exposure. Palbociclib can cause diarrhea, but she has not taken this recently. No further episodes.  5. Metastatic breast cancer: Follows with Dr. Marin Olp of oncology at the [facility]. She is currently on palbociclib and fluvestrant, and she has received approximately a month ago prior to her previous admission. She is scheduled to see oncology on 09/03/2014 to determine if she should continue these therapies.  6. Anemia of chronic disease: Hemoglobin stable from admission one month ago. Last anemia panel in February 2016 consistent with anemia of chronic disease. History of B-12 and iron deficiency secondary to gastric bypass, and she receives vitamin B-12 IM monthly and IV iron as indicated.  7. Hypertension: Continued home Coreg 3.125 mg BID.  8. Hypothyroidism: Status post hemithyroidectomy for Hurthle cell tumor.Continued home Synthroid 300 mcg. Her TSH was 0.181 so her home Synthroid was decreased to 275 mcg. Recheck TSH in 6 weeks.  9. Osteoarthritis/chronic pain: Reports pain in her back that is chronic along with occasional muscle spasms in her lower extremities for which she takes Robaxin or tizanidine. However, she does not report taking these medications recently. Reports following with a pain clinic for pain and taking OxyContin along with Percocet/oxycodone 20-325 mg every 4 hours as needed, trying to cut back to every 5 or  6 hours. Continued home OxyContin 20 mg every 12 hours, home Percocet/oxycodone 20-325 mg every 6 hours as needed, and home Lyrica 200 mg twice a day.  10. Depression/anxiety: Continue home Prozac 40 mg daily and temazepam 15 mg daily at bedtime when necessary.  Discharge Vitals:   BP 104/65 mmHg  Pulse 82  Temp(Src) 97.7 F (36.5 C) (Oral)  Resp 18  Ht 5\' 6"  (1.676 m)  Wt 272 lb 9.6 oz (123.651 kg)  BMI 44.02 kg/m2  SpO2 100%  LMP 08/30/2006  Discharge Labs:  Basic Metabolic Panel:  Last Labs      Recent Labs Lab 08/27/14 0410 08/27/14 1210 08/28/14 0336  NA 142 --  142  K 4.4 --  4.3  CL 113* --  114*  CO2 20 --  21  GLUCOSE 79 --  92  BUN 46* --  38*  CREATININE 1.45* --  1.33*  CALCIUM 8.1* --  7.8*  MG --  2.0 --   PHOS --  4.2 3.6     Liver Function Tests:  Last Labs      Recent Labs Lab 08/27/14 1210 08/28/14 0336  AST 19 --   ALT 15 --   ALKPHOS 122* --   BILITOT 0.8 --   PROT 6.1 --   ALBUMIN 2.3* 1.9*      Last Labs     No results for input(s): LIPASE, AMYLASE in the last 168 hours.    CBC:  Last Labs      Recent Labs Lab 08/27/14 0410 08/28/14 0336  WBC 5.1 5.0  NEUTROABS 2.3 --   HGB 9.2* 8.5*  HCT 28.8* 27.0*  MCV 100.3* 100.4*  PLT 192 180     Cardiac Enzymes:  Last Labs      Recent Labs Lab 08/27/14 0410 08/27/14 1210  TROPONINI <0.03 0.03     CBG:  Last Labs      Recent Labs Lab 08/26/14 1622  GLUCAP 88     Urine Drug Screen: Drugs of Abuse   Labs (Brief)       Component Value Date/Time   LABOPIA NEG 03/07/2011 1138   COCAINSCRNUR NEG 03/07/2011 1138   LABBENZ NEG 03/07/2011 1138   AMPHETMU NEG 03/07/2011 1138      Urinalysis:  Last Labs      Recent Labs Lab 08/26/14 1850  COLORURINE YELLOW  LABSPEC 1.018  PHURINE 5.0  GLUCOSEU NEGATIVE  HGBUR  NEGATIVE  BILIRUBINUR NEGATIVE  KETONESUR NEGATIVE  PROTEINUR NEGATIVE  UROBILINOGEN 0.2  NITRITE NEGATIVE  LEUKOCYTESUR NEGATIVE          Signed: Milagros Loll, MD 08/31/2014, 8:37 AM    Services Ordered on Discharge: None Equipment Ordered on Discharge: None

## 2014-09-01 ENCOUNTER — Telehealth (HOSPITAL_COMMUNITY): Payer: Self-pay | Admitting: Cardiology

## 2014-09-01 DIAGNOSIS — I5022 Chronic systolic (congestive) heart failure: Secondary | ICD-10-CM

## 2014-09-01 NOTE — Telephone Encounter (Signed)
Pt left message regarding LE edema-LMOM

## 2014-09-01 NOTE — Telephone Encounter (Signed)
May use Lasix 20 mg daily but will need appointment in office this week with [facility].

## 2014-09-01 NOTE — Telephone Encounter (Signed)
Pamala Hurry, RN with [facility] called after pts [place] visit Pt recently in the hospital Pt states LE edema and pain was not addressed Pt weight is up to 258lb 1+ LE edema and painful Diuretic was stopped at discharge due to increase Cr  Please advised

## 2014-09-02 MED ORDER — FUROSEMIDE 20 MG PO TABS
20.0000 mg | ORAL_TABLET | Freq: Every day | ORAL | Status: DC
Start: 2014-09-02 — End: 2014-09-04

## 2014-09-02 NOTE — Telephone Encounter (Signed)
Pt/pts daughter aware Follow up scheduled for 5/6

## 2014-09-03 ENCOUNTER — Ambulatory Visit (HOSPITAL_BASED_OUTPATIENT_CLINIC_OR_DEPARTMENT_OTHER): Payer: Medicare Other | Admitting: Family

## 2014-09-03 ENCOUNTER — Other Ambulatory Visit (HOSPITAL_BASED_OUTPATIENT_CLINIC_OR_DEPARTMENT_OTHER): Payer: Medicare Other

## 2014-09-03 ENCOUNTER — Ambulatory Visit (HOSPITAL_BASED_OUTPATIENT_CLINIC_OR_DEPARTMENT_OTHER): Payer: Medicare Other

## 2014-09-03 ENCOUNTER — Encounter: Payer: Self-pay | Admitting: Family

## 2014-09-03 VITALS — BP 90/58 | HR 61 | Temp 97.3°F | Resp 16 | Ht 66.0 in | Wt 294.0 lb

## 2014-09-03 DIAGNOSIS — D510 Vitamin B12 deficiency anemia due to intrinsic factor deficiency: Secondary | ICD-10-CM

## 2014-09-03 DIAGNOSIS — D34 Benign neoplasm of thyroid gland: Secondary | ICD-10-CM

## 2014-09-03 DIAGNOSIS — Z5111 Encounter for antineoplastic chemotherapy: Secondary | ICD-10-CM | POA: Diagnosis not present

## 2014-09-03 DIAGNOSIS — C50919 Malignant neoplasm of unspecified site of unspecified female breast: Secondary | ICD-10-CM

## 2014-09-03 DIAGNOSIS — C787 Secondary malignant neoplasm of liver and intrahepatic bile duct: Secondary | ICD-10-CM

## 2014-09-03 DIAGNOSIS — C50912 Malignant neoplasm of unspecified site of left female breast: Secondary | ICD-10-CM

## 2014-09-03 LAB — CBC WITH DIFFERENTIAL (CANCER CENTER ONLY)
BASO#: 0 10*3/uL (ref 0.0–0.2)
BASO%: 0.2 % (ref 0.0–2.0)
EOS%: 3.7 % (ref 0.0–7.0)
Eosinophils Absolute: 0.2 10*3/uL (ref 0.0–0.5)
HEMATOCRIT: 34.5 % — AB (ref 34.8–46.6)
HEMOGLOBIN: 10.9 g/dL — AB (ref 11.6–15.9)
LYMPH#: 1.1 10*3/uL (ref 0.9–3.3)
LYMPH%: 20.8 % (ref 14.0–48.0)
MCH: 32.8 pg (ref 26.0–34.0)
MCHC: 31.6 g/dL — AB (ref 32.0–36.0)
MCV: 104 fL — ABNORMAL HIGH (ref 81–101)
MONO#: 0.3 10*3/uL (ref 0.1–0.9)
MONO%: 6.4 % (ref 0.0–13.0)
NEUT#: 3.6 10*3/uL (ref 1.5–6.5)
NEUT%: 68.9 % (ref 39.6–80.0)
Platelets: 184 10*3/uL (ref 145–400)
RBC: 3.32 10*6/uL — ABNORMAL LOW (ref 3.70–5.32)
RDW: 14.4 % (ref 11.1–15.7)
WBC: 5.2 10*3/uL (ref 3.9–10.0)

## 2014-09-03 LAB — CMP (CANCER CENTER ONLY)
ALK PHOS: 113 U/L — AB (ref 26–84)
ALT: 13 U/L (ref 10–47)
AST: 19 U/L (ref 11–38)
Albumin: 2.7 g/dL — ABNORMAL LOW (ref 3.3–5.5)
BILIRUBIN TOTAL: 0.7 mg/dL (ref 0.20–1.60)
BUN, Bld: 25 mg/dL — ABNORMAL HIGH (ref 7–22)
CALCIUM: 8.6 mg/dL (ref 8.0–10.3)
CHLORIDE: 110 meq/L — AB (ref 98–108)
CO2: 22 mEq/L (ref 18–33)
Creat: 1.3 mg/dl — ABNORMAL HIGH (ref 0.6–1.2)
Glucose, Bld: 59 mg/dL — ABNORMAL LOW (ref 73–118)
Potassium: 4 mEq/L (ref 3.3–4.7)
SODIUM: 137 meq/L (ref 128–145)
Total Protein: 6.5 g/dL (ref 6.4–8.1)

## 2014-09-03 LAB — CHCC SATELLITE - SMEAR

## 2014-09-03 MED ORDER — FULVESTRANT 250 MG/5ML IM SOLN
INTRAMUSCULAR | Status: AC
  Filled 2014-09-03: qty 10

## 2014-09-03 MED ORDER — FULVESTRANT 250 MG/5ML IM SOLN
500.0000 mg | Freq: Once | INTRAMUSCULAR | Status: AC
  Administered 2014-09-03: 500 mg via INTRAMUSCULAR

## 2014-09-03 NOTE — Patient Instructions (Signed)
Fulvestrant injection  What is this medicine?  FULVESTRANT (ful VES trant) blocks the effects of estrogen. It is used to treat breast cancer in women past the age of menopause.  This medicine may be used for other purposes; ask your health care provider or pharmacist if you have questions.  COMMON BRAND NAME(S): FASLODEX  What should I tell my health care provider before I take this medicine?  They need to know if you have any of these conditions:  -bleeding problems  -liver disease  -low levels of platelets in the blood  -an unusual or allergic reaction to fulvestrant, other medicines, foods, dyes, or preservatives  -pregnant or trying to get pregnant  -breast-feeding  How should I use this medicine?  This medicine is for injection into a muscle. It is usually given by a health care professional in a hospital or clinic setting.  Talk to your pediatrician regarding the use of this medicine in children. Special care may be needed.  Overdosage: If you think you have taken too much of this medicine contact a poison control center or emergency room at once.  NOTE: This medicine is only for you. Do not share this medicine with others.  What if I miss a dose?  It is important not to miss your dose. Call your doctor or health care professional if you are unable to keep an appointment.  What may interact with this medicine?  -medicines that treat or prevent blood clots like warfarin, enoxaparin, and dalteparin  This list may not describe all possible interactions. Give your health care provider a list of all the medicines, herbs, non-prescription drugs, or dietary supplements you use. Also tell them if you smoke, drink alcohol, or use illegal drugs. Some items may interact with your medicine.  What should I watch for while using this medicine?  Your condition will be monitored carefully while you are receiving this medicine. You will need important blood work done while you are taking this medicine.  Do not become pregnant  while taking this medicine. Women should inform their doctor if they wish to become pregnant or think they might be pregnant. There is a potential for serious side effects to an unborn child. Talk to your health care professional or pharmacist for more information.  What side effects may I notice from receiving this medicine?  Side effects that you should report to your doctor or health care professional as soon as possible:  -allergic reactions like skin rash, itching or hives, swelling of the face, lips, or tongue  -feeling faint or lightheaded, falls  -fever or flu-like symptoms  -sore throat  -vaginal bleeding  Side effects that usually do not require medical attention (report to your doctor or health care professional if they continue or are bothersome):  -aches, pains  -constipation or diarrhea  -headache  -hot flashes  -nausea, vomiting  -pain at site where injected  -stomach pain  This list may not describe all possible side effects. Call your doctor for medical advice about side effects. You may report side effects to FDA at 1-800-FDA-1088.  Where should I keep my medicine?  This drug is given in a hospital or clinic and will not be stored at home.  NOTE: This sheet is a summary. It may not cover all possible information. If you have questions about this medicine, talk to your doctor, pharmacist, or health care provider.   2015, [employer]. (2007-08-26 15:39:24)

## 2014-09-03 NOTE — Progress Notes (Signed)
Hematology and Oncology Follow Up Visit  Kerri Vasquez [id number] 1955-09-19 60 y.o. 09/03/2014   Principle Diagnosis:  1. Metastatic breast cancer, solitary liver metastasis - recurrent 2. Gastric bypass for subsequent B12 and iron deficiency 3. Osteoarthritis with chronic pain 4. 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 Faslodex 500 mg IM monthly 2. Ibrance 100mg  po q day (21/7) 2. Vitamin B12 1 mg IM monthly 3. IV iron as indicated    Interim History:  Kerri Vasquez is here today with her daughter for a follow-up. They are both very emotional and crying. She was hospitalized in Late April for Fluid overload and acute kidney injury.  Unfortunately, she had an MI after her 2nd liver biopsy in March. Her Echo at that time showed an EF of 20-25%. She is now on Coreg and also started back on Lasix yesterday. During all of this the Dr. seeing her stopped her Ibrance. We will have her start this back today. She is also getting Faslodex injections monthly.  Her Echo in April showed an EF of 55-60% so she has definitely improved. Her BUN is down to 25 and Creatinine still hanging around 1.3. She is making plenty of urine but states that she does not feel like it is as much as she should be putting out while on Lasix.   She still retaining a lot of fluids. Her weight is up 22 lbs today. She is unable to walk without assistance and is using a wheelchair to get around due to the heaviness of her legs.  Her liver biopsy in march showed metastatic breast cancer, ER positive. Her last CA 27.29 was 37.  She denies fever, chills, n/v, cough, rash, dizziness, chest pain, palpitations, abdominal pain, constipation, diarrhea, blood in urine or stool. No lymphadenopathy. No bruising or episodes of bleeding.  No numbness or tingling in her extremities.  Her appetite is good and she is drinking fluids.  She lives with her daughter who helps with her care.   Medications:      Medication List       This list is accurate as of: 09/03/14  2:17 PM.  Always use your most recent med list.               acetaminophen 500 MG tablet  Commonly known as:  TYLENOL  Take 1,000 mg by mouth every 4 (four) hours as needed for moderate pain.     carvedilol 3.125 MG tablet  Commonly known as:  COREG  Take 1 tablet (3.125 mg total) by mouth 2 (two) times daily with a meal.     FLUoxetine 40 MG capsule  Commonly known as:  PROZAC  Take 40 mg by mouth daily before breakfast.     furosemide 20 MG tablet  Commonly known as:  LASIX  Take 1 tablet (20 mg total) by mouth daily.     levothyroxine 25 MCG tablet  Commonly known as:  SYNTHROID, LEVOTHROID  Take 11 tablets (275 mcg total) by mouth daily before breakfast.     LORazepam 0.5 MG tablet  Commonly known as:  ATIVAN  PLACE 1 TABLET UNDER THE TONGUE EVERY 6 HOURS AS NEEDED FOR NAUSEA     nitroGLYCERIN 0.4 MG SL tablet  Commonly known as:  NITROSTAT  Place 1 tablet (0.4 mg total) under the tongue every 5 (five) minutes as needed for chest pain.     ondansetron 8 MG tablet  Commonly known as:  ZOFRAN  Take 1 tablet (8 mg total) by mouth every 8 (eight) hours as needed for nausea or vomiting.     OxyCODONE 20 mg T12a 12 hr tablet  Commonly known as:  OXYCONTIN  Take 20 mg by mouth every 12 (twelve) hours.     oxycodone 5 MG capsule  Commonly known as:  OXY-IR  Take 5 mg by mouth every 4 (four) hours as needed.     oxyCODONE-acetaminophen 10-325 MG per tablet  Commonly known as:  PERCOCET  Take 1 tablet by mouth.     pregabalin 200 MG capsule  Commonly known as:  LYRICA  Take 1 capsule (200 mg total) by mouth 2 (two) times daily.     promethazine 25 MG tablet  Commonly known as:  PHENERGAN  TAKE 1 TABLET BY MOUTH THREE TIMES DAILY AS NEEDED FOR NAUSEA     spironolactone 25 MG tablet  Commonly known as:  ALDACTONE  Take 1 tablet (25 mg total) by mouth daily.     temazepam 15 MG capsule  Commonly  known as:  RESTORIL  15 mg po qhs prn for sleep.  If needed, can take an additional 15mg .     tiZANidine 4 MG tablet  Commonly known as:  ZANAFLEX  Take 4 mg by mouth every 6 (six) hours as needed for muscle spasms.        Allergies:  Allergies  Allergen Reactions  . Influenza Vac Split [Flu Virus Vaccine] Other (See Comments)    Joint stiffness and renal failure  . Ritalin [Methylphenidate Hcl]     "Heart attack"  . Zostavax [Zoster Vaccine Live ([employer])] Other (See Comments)    Joint stiffness, renal failure  . Adhesive [Tape] Itching and Rash    Past Medical History, Surgical history, Social history, and Family History were reviewed and updated.  Review of Systems: All other 10 point review of systems is negative.   Physical Exam:  height is 5\' 6"  (1.676 m) and weight is 294 lb (133.358 kg). Her oral temperature is 97.3 F (36.3 C). Her blood pressure is 90/58 and her pulse is 61. Her respiration is 16.   Wt Readings from Last 3 Encounters:  09/03/14 294 lb (133.358 kg)  08/26/14 272 lb 9.6 oz (123.651 kg)  08/18/14 238 lb 9.6 oz (108.228 kg)    Ocular: Sclerae unicteric, pupils equal, round and reactive to light Ear-nose-throat: Oropharynx clear, dentition fair Lymphatic: No cervical or supraclavicular adenopathy Lungs no rales or rhonchi, good excursion bilaterally Heart regular rate and rhythm, no murmur appreciated Abd soft, nontender, positive bowel sounds MSK no focal spinal tenderness, no joint edema Neuro: non-focal, well-oriented, appropriate affect Breasts: Has had bilateral mastectomies. No change with chest. No lymphadenopathy.   Lab Results  Component Value Date   WBC 5.2 09/03/2014   HGB 10.9* 09/03/2014   HCT 34.5* 09/03/2014   MCV 104* 09/03/2014   PLT 184 09/03/2014   Lab Results  Component Value Date   FERRITIN 1,641* 06/29/2014   IRON 111 06/29/2014   TIBC 219* 06/29/2014   UIBC 108* 06/29/2014   IRONPCTSAT 51 06/29/2014   Lab  Results  Component Value Date   RETICCTPCT 1.0 06/29/2014   RBC 3.32* 09/03/2014   RETICCTABS 32.3 06/29/2014   No results found for: KPAFRELGTCHN, LAMBDASER, KAPLAMBRATIO No results found for: Kandis Cocking, IGMSERUM Lab Results  Component Value Date   TOTALPROTELP 4.7* 01/31/2010   ALBUMINELP 39.4* 01/31/2010   A1GS 12.1* 01/31/2010   A2GS 20.3*  01/31/2010   BETS 6.0 01/31/2010   BETA2SER 6.8* 01/31/2010   GAMS 15.4 01/31/2010   MSPIKE NOT DETECTED 01/31/2010   SPEI  01/31/2010    (NOTE) The possibility of a faint restricted band(s) cannot be completely excluded in the gamma region.  Suggest serum IFE to evaluate possibility, if clinically indicated. (Lab will hold sample one week. Please call Customer Service at [phone] to  add test.) Reviewed by Odis Hollingshead, MD, PhD, FCAP (Electronic Signature on File)     Chemistry      Component Value Date/Time   NA 142 08/28/2014 0336   NA 144 06/29/2014 0824   K 4.3 08/28/2014 0336   K 3.6 06/29/2014 0824   CL 114* 08/28/2014 0336   CL 101 06/29/2014 0824   CO2 21 08/28/2014 0336   CO2 28 06/29/2014 0824   BUN 38* 08/28/2014 0336   BUN 30* 06/29/2014 0824   CREATININE 1.33* 08/28/2014 0336   CREATININE 1.1 06/29/2014 0824      Component Value Date/Time   CALCIUM 7.8* 08/28/2014 0336   CALCIUM 9.5 06/29/2014 0824   ALKPHOS 122* 08/27/2014 1210   ALKPHOS 101* 06/29/2014 0824   AST 19 08/27/2014 1210   AST 27 06/29/2014 0824   ALT 15 08/27/2014 1210   ALT 31 06/29/2014 0824   BILITOT 0.8 08/27/2014 1210   BILITOT 0.70 06/29/2014 0824     Impression and Plan: Impression and Plan: Kerri Vasquez is 59 year old female with metastatic breast cancer with mets to the liver.  She has a great deal of other health issues that are causing her a lot of stress. Unfortunatly she did have an MI after her liver biopsy in March. Her latest Echo was improved. She will have another on May 12th. She has an appointment with Cardiology  tomorrow and is taking Coreg and Lasix daily.  She will continue her monthly Faslodex. We will restart her [facility] (she has been off since early March). We will also get another PET scan on her in a few weeks.  We will plan to see her back in 1 months for labs and follow-up. We will discuss her PET scan with her at that time.   She and her daughter know to contact us with any questions or concerns. We can certainly see her sooner if need be.   Eliezer Bottom, NP 5/5/20162:17 PM

## 2014-09-04 ENCOUNTER — Ambulatory Visit (HOSPITAL_COMMUNITY)
Admission: RE | Admit: 2014-09-04 | Discharge: 2014-09-04 | Disposition: A | Discharge status: Home / Self Care | Payer: Medicare Other | Source: Ambulatory Visit | Attending: Cardiology | Admitting: Cardiology

## 2014-09-04 ENCOUNTER — Telehealth (HOSPITAL_COMMUNITY): Payer: Self-pay | Admitting: Vascular Surgery

## 2014-09-04 ENCOUNTER — Other Ambulatory Visit: Payer: Self-pay | Admitting: Hematology & Oncology

## 2014-09-04 ENCOUNTER — Telehealth: Payer: Self-pay | Admitting: Hematology & Oncology

## 2014-09-04 ENCOUNTER — Encounter (HOSPITAL_COMMUNITY): Payer: Self-pay

## 2014-09-04 VITALS — BP 92/60 | HR 65 | Wt 297.8 lb

## 2014-09-04 DIAGNOSIS — F329 Major depressive disorder, single episode, unspecified: Secondary | ICD-10-CM | POA: Insufficient documentation

## 2014-09-04 DIAGNOSIS — I5181 Takotsubo syndrome: Secondary | ICD-10-CM

## 2014-09-04 DIAGNOSIS — M797 Fibromyalgia: Secondary | ICD-10-CM | POA: Diagnosis not present

## 2014-09-04 DIAGNOSIS — I420 Dilated cardiomyopathy: Secondary | ICD-10-CM

## 2014-09-04 DIAGNOSIS — I5022 Chronic systolic (congestive) heart failure: Secondary | ICD-10-CM | POA: Diagnosis not present

## 2014-09-04 DIAGNOSIS — M419 Scoliosis, unspecified: Secondary | ICD-10-CM | POA: Diagnosis not present

## 2014-09-04 DIAGNOSIS — Z79899 Other long term (current) drug therapy: Secondary | ICD-10-CM | POA: Insufficient documentation

## 2014-09-04 DIAGNOSIS — C787 Secondary malignant neoplasm of liver and intrahepatic bile duct: Secondary | ICD-10-CM | POA: Insufficient documentation

## 2014-09-04 DIAGNOSIS — I252 Old myocardial infarction: Secondary | ICD-10-CM | POA: Insufficient documentation

## 2014-09-04 DIAGNOSIS — R0600 Dyspnea, unspecified: Secondary | ICD-10-CM | POA: Diagnosis not present

## 2014-09-04 DIAGNOSIS — C50919 Malignant neoplasm of unspecified site of unspecified female breast: Secondary | ICD-10-CM | POA: Diagnosis not present

## 2014-09-04 DIAGNOSIS — E119 Type 2 diabetes mellitus without complications: Secondary | ICD-10-CM | POA: Insufficient documentation

## 2014-09-04 DIAGNOSIS — I10 Essential (primary) hypertension: Secondary | ICD-10-CM | POA: Insufficient documentation

## 2014-09-04 DIAGNOSIS — G4730 Sleep apnea, unspecified: Secondary | ICD-10-CM | POA: Insufficient documentation

## 2014-09-04 DIAGNOSIS — K219 Gastro-esophageal reflux disease without esophagitis: Secondary | ICD-10-CM | POA: Insufficient documentation

## 2014-09-04 DIAGNOSIS — E039 Hypothyroidism, unspecified: Secondary | ICD-10-CM | POA: Insufficient documentation

## 2014-09-04 DIAGNOSIS — I5033 Acute on chronic diastolic (congestive) heart failure: Secondary | ICD-10-CM | POA: Insufficient documentation

## 2014-09-04 DIAGNOSIS — I5032 Chronic diastolic (congestive) heart failure: Secondary | ICD-10-CM | POA: Diagnosis not present

## 2014-09-04 LAB — BASIC METABOLIC PANEL
ANION GAP: 7 (ref 5–15)
BUN: 27 mg/dL — ABNORMAL HIGH (ref 6–20)
CALCIUM: 7.9 mg/dL — AB (ref 8.9–10.3)
CO2: 19 mmol/L — ABNORMAL LOW (ref 22–32)
CREATININE: 1.27 mg/dL — AB (ref 0.44–1.00)
Chloride: 112 mmol/L — ABNORMAL HIGH (ref 101–111)
GFR calc non Af Amer: 45 mL/min — ABNORMAL LOW (ref >60)
GFR, EST AFRICAN AMERICAN: 52 mL/min — AB (ref >60)
Glucose, Bld: 80 mg/dL (ref 70–99)
Potassium: 4.6 mmol/L (ref 3.5–5.1)
Sodium: 138 mmol/L (ref 135–145)

## 2014-09-04 MED ORDER — FUROSEMIDE 10 MG/ML IJ SOLN
80.0000 mg | Freq: Once | INTRAMUSCULAR | Status: AC
  Administered 2014-09-04: 80 mg via INTRAVENOUS

## 2014-09-04 MED ORDER — POTASSIUM CHLORIDE CRYS ER 20 MEQ PO TBCR: 40.0000 meq | EXTENDED_RELEASE_TABLET | Freq: Two times a day (BID) | ORAL | Status: DC

## 2014-09-04 MED ORDER — FUROSEMIDE 20 MG PO TABS: 40.0000 mg | ORAL_TABLET | Freq: Two times a day (BID) | ORAL | Status: DC

## 2014-09-04 MED ORDER — POTASSIUM CHLORIDE CRYS ER 20 MEQ PO TBCR
20.0000 meq | EXTENDED_RELEASE_TABLET | Freq: Once | ORAL | Status: AC
  Administered 2014-09-04: 40 meq via ORAL

## 2014-09-04 NOTE — Progress Notes (Signed)
Patient ID: Kerri Vasquez, female   DOB: [birth year], 59 y.o.   MRN: [id number] Primary Cardiologist: Dr Haroldine Laws Oncologist: Dr Marin Olp  HPI: Kerri Vasquez is 59 y/o woman with obesity s/p gastric bypass, HTN and breast CA. Underwent chemo and bilateral mastectomies in 2008. It sounds like her chemotherapy was Adriamycin/Cytoxan followed by Taxotere. She was then put on tamoxifen for year and then Femara. She had a previous liver lesion that was ablated.   Unfortunately found to have recurrent liver metastasis and underwent IR ablation in 3/16. Procedure complicated by acute CP and dyspnea with hypotension. Trop peaked at 17. ECHO with EF 20-25% with RWMA. . Cath on 07/14/14 with no significant CAD. LVEDP 10. Echo in 2011 showed normal EF. Prior to discharge struggled with hypotension and required pressors. Could only tolerate low-dose HF meds.  Suspected Takotsubo cardiomyopathy.   She is now getting cancer treatment with palbociclib and Faslodex, neither of which commonly cause cardiac toxicity.   She was readmitted in 4/16 with AKI, creatinine up to 1.73.  Prior to this, she had increased Lasix to 80 mg daily with weight gain and edema.  She was given IV fluid in the hospital with fall in creatinine.  Echo in 4/16 showed improvement in EF to 55-60%, the RV was poorly seen. Lasix was stopped in the hospital.   Since that time, weight has gone up markedly.  Her weight is up 56 lbs since last appointment.  She has edema into her thighs.  Lasix was restarted Wednesday at 20 mg daily.  She is short of breath after walking 20 feet.  She uses a walker.  No chest pain.  +Orthopnea, uses several pillows.  No lightheadedness, no palpitations.    Labs (4/16): creatinine 1.7 Labs (5/16): K 4, creatinine 1.3, LFTs normal, HCT 34.5  ECHO 06/2014: EF 20-25%  ECHO 4/16: EF 55-60%  ROS: All systems negative except as listed in HPI, PMH and Problem List.  SH:  History   Social History  . Marital Status:  Divorced    Spouse Name: N/A  . Number of Children: 1  . Years of Education: N/A   Occupational History  . Not on file.   Social History Main Topics  . Smoking status: Never Smoker   . Smokeless tobacco: Never Used     Comment: NEVER USED TOBACCO  . Alcohol Use: No  . Drug Use: No  . Sexual Activity: Not Currently   Other Topics Concern  . Not on file   Social History Narrative   Caffeine use: 3 cups coffee/tea daily   Regular exercise: no          FH:  Family History  Problem Relation Age of Onset  . Cancer Mother     breast  . Hypertension Mother   . Anesthesia problems Daughter     Past Medical History  Diagnosis Date  . Depression   . Hypertension   . Hypothyroidism   . Incisional hernia   . Spinal stenosis   . Scoliosis   . Sciatica   . Intestinal obstruction   . GERD (gastroesophageal reflux disease)   . Septic arthritis 01/28/10    and spinal stenosis , moderate scoliosis   . Osteoarthritis of multiple joints   . Sleep apnea     hx of off machine x 4 years   . Renal insufficiency     hx renal failure 2011  . Fibromyalgia   . Fracture of multiple toes  right toes x 4 toes- excluding small toe  . Diabetes mellitus     type II, controlled by diet 05/25/11   . CHF (congestive heart failure)   . Anemia   . Pernicious anemia 05/30/2011    Iron transfusion and VB12 on 06/06/11  . Migraine     "weekly; but they don't last long" (08/26/2014)  . Chronic lower back pain   . Breast cancer, right breast   . Liver cancer     "twice"  . Thyroid cancer   . Myocardial infarction 06/2014    S/P percutaneous intervention procedure a metastatic lesion in the liver    Current Outpatient Prescriptions  Medication Sig Dispense Refill  . acetaminophen (TYLENOL) 500 MG tablet Take 1,000 mg by mouth every 4 (four) hours as needed for moderate pain.    . carvedilol (COREG) 3.125 MG tablet Take 1 tablet (3.125 mg total) by mouth 2 (two) times daily with a meal. 60  tablet 1  . FLUoxetine (PROZAC) 40 MG capsule Take 40 mg by mouth daily before breakfast.     . furosemide (LASIX) 20 MG tablet Take 2 tablets (40 mg total) by mouth 2 (two) times daily. 120 tablet 3  . levothyroxine (SYNTHROID, LEVOTHROID) 25 MCG tablet Take 11 tablets (275 mcg total) by mouth daily before breakfast. (Patient taking differently: Take 300 mcg by mouth daily before breakfast. ) 30 tablet 1  . LORazepam (ATIVAN) 0.5 MG tablet PLACE 1 TABLET UNDER THE TONGUE EVERY 6 HOURS AS NEEDED FOR NAUSEA 60 tablet 0  . nitroGLYCERIN (NITROSTAT) 0.4 MG SL tablet Place 1 tablet (0.4 mg total) under the tongue every 5 (five) minutes as needed for chest pain. 30 tablet 12  . ondansetron (ZOFRAN) 8 MG tablet Take 1 tablet (8 mg total) by mouth every 8 (eight) hours as needed for nausea or vomiting. 60 tablet 2  . oxycodone (OXY-IR) 5 MG capsule Take 5 mg by mouth every 4 (four) hours as needed.    . OxyCODONE (OXYCONTIN) 20 mg T12A 12 hr tablet Take 20 mg by mouth every 12 (twelve) hours.    Marland Kitchen oxyCODONE-acetaminophen (PERCOCET) 10-325 MG per tablet Take 1 tablet by mouth.    . pregabalin (LYRICA) 200 MG capsule Take 1 capsule (200 mg total) by mouth 2 (two) times daily. 60 capsule 1  . promethazine (PHENERGAN) 25 MG tablet TAKE 1 TABLET BY MOUTH THREE TIMES DAILY AS NEEDED FOR NAUSEA 90 tablet 0  . spironolactone (ALDACTONE) 25 MG tablet Take 1 tablet (25 mg total) by mouth daily. 30 tablet 3  . temazepam (RESTORIL) 15 MG capsule TAKE 1 CAPSULE EVERY BEDTIME AS NEEDED FOR SLEEP--IF NEEDED CAN TAKE 1 ADDITIONAL CAPSULE 50 capsule 0  . tiZANidine (ZANAFLEX) 4 MG tablet Take 4 mg by mouth every 6 (six) hours as needed for muscle spasms.    . potassium chloride SA (KLOR-CON M20) 20 MEQ tablet Take 2 tablets (40 mEq total) by mouth 2 (two) times daily. 120 tablet 3   No current facility-administered medications for this encounter.    Filed Vitals:   09/04/14 1035  BP: 92/60  Pulse: 65  Weight: 297 lb  12 oz (135.059 kg)  SpO2: 99%    PHYSICAL EXAM:  General:  Obese woman sitting in chair. No resp difficulty HEENT: normal Neck: supple. JVP 10 cm with HJR. Carotids 2+ bilaterally; no bruits. No lymphadenopathy or thryomegaly appreciated. Cor: PMI normal. Regular rate & rhythm. No rubs, gallops or murmurs. Lungs: clear Abdomen:  soft, obese nontender, nondistended. No hepatosplenomegaly. No bruits or masses. Good bowel sounds. Extremities: no cyanosis, clubbing, rash, 2+ edema to thighs Neuro: alert & orientedx3, cranial nerves grossly intact. Moves all 4 extremities w/o difficulty. Affect pleasant   ASSESSMENT & PLAN: 1. 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 Echo 06/2014 EF 20-25%. Coronary angiography with no CAD.  4/16 echo showed EF improved to 55-60%.   - Would continue Coreg 3.125 mg bid.  2. Chronic diastolic CHF: Patient is markedly volume overloaded despite improvement in EF.   RV was poorly visualized by echo, so I wonder if she does not have prominent RV failure (?OHS/OSA, ?chronic PE with pulmonary hypertension).  She had been off Lasix until earlier this week.  Weight is up 50+ lbs over the last month. NYHA class III symptoms.  - Lasix 80 mg IV x 1 in the office today.  - Increase home Lasix to 40 mg bid and home KCl to 40 bid.   - BNP today.  - BMET in 1 week with followup.  - Will arrange V/Q scan to look for chronic PE.  - Will need sleep study. - Will likely need RHC, can reassess at next appointment.  - Wear compression stockings during the day.  2. Metastatic breast cancer: s/p IR ablation of liver metastasis.  She is currently getting palbociclib and Faslodex which do not have prominent risk of cardiotoxicity.  3. Morbid obesity 4. Deconditioning - continue HHPT  Loralie Champagne 09/04/2014

## 2014-09-04 NOTE — Telephone Encounter (Signed)
DONE

## 2014-09-04 NOTE — Telephone Encounter (Signed)
[facility] called they needs to speak to some one to verify dosage and directions for pt potassium.. Please advise

## 2014-09-04 NOTE — Telephone Encounter (Signed)
Mailed 5-27 PET appointments, instructions and 6-6 MD appointment

## 2014-09-04 NOTE — Patient Instructions (Signed)
INCREASE Lasix to 40mg  (2 tablets) twice daily.  START Potassium 40 meq (2 tablets) twice daily.  Will call you to schedule a VQ scan to rule out a pulmonary embolism (blood clot in lung).  Return in 1 week.  Do the following things EVERYDAY: 1) Weigh yourself in the morning before breakfast. Write it down and keep it in a log. 2) Take your medicines as prescribed 3) Eat low salt foods-Limit salt (sodium) to 2000 mg per day.  4) Stay as active as you can everyday 5) Limit all fluids for the day to less than 2 liters

## 2014-09-09 ENCOUNTER — Ambulatory Visit: Payer: Medicare Other | Admitting: Physical Therapy

## 2014-09-10 ENCOUNTER — Other Ambulatory Visit (HOSPITAL_COMMUNITY): Payer: Self-pay | Admitting: *Deleted

## 2014-09-10 ENCOUNTER — Ambulatory Visit (HOSPITAL_COMMUNITY): Admission: RE | Admit: 2014-09-10 | Payer: Medicare Other | Source: Ambulatory Visit

## 2014-09-10 ENCOUNTER — Inpatient Hospital Stay (HOSPITAL_COMMUNITY): Admission: RE | Admit: 2014-09-10 | Payer: Self-pay | Source: Ambulatory Visit

## 2014-09-11 ENCOUNTER — Encounter: Payer: Self-pay | Admitting: *Deleted

## 2014-09-11 ENCOUNTER — Encounter: Payer: Self-pay | Admitting: Family

## 2014-09-14 ENCOUNTER — Encounter (HOSPITAL_COMMUNITY): Payer: Self-pay | Admitting: *Deleted

## 2014-09-14 ENCOUNTER — Ambulatory Visit (HOSPITAL_COMMUNITY): Payer: Medicare Other

## 2014-09-14 ENCOUNTER — Ambulatory Visit (HOSPITAL_COMMUNITY)
Admission: RE | Admit: 2014-09-14 | Discharge: 2014-09-14 | Disposition: A | Discharge status: Home / Self Care | Payer: Medicare Other | Source: Ambulatory Visit | Attending: Internal Medicine | Admitting: Internal Medicine

## 2014-09-14 VITALS — BP 112/68 | HR 84 | Wt 291.0 lb

## 2014-09-14 DIAGNOSIS — Z9221 Personal history of antineoplastic chemotherapy: Secondary | ICD-10-CM | POA: Insufficient documentation

## 2014-09-14 DIAGNOSIS — I5181 Takotsubo syndrome: Secondary | ICD-10-CM

## 2014-09-14 DIAGNOSIS — Z9013 Acquired absence of bilateral breasts and nipples: Secondary | ICD-10-CM | POA: Insufficient documentation

## 2014-09-14 DIAGNOSIS — I5032 Chronic diastolic (congestive) heart failure: Secondary | ICD-10-CM | POA: Diagnosis not present

## 2014-09-14 DIAGNOSIS — I5022 Chronic systolic (congestive) heart failure: Secondary | ICD-10-CM | POA: Diagnosis not present

## 2014-09-14 DIAGNOSIS — Z8249 Family history of ischemic heart disease and other diseases of the circulatory system: Secondary | ICD-10-CM | POA: Diagnosis not present

## 2014-09-14 DIAGNOSIS — Z79899 Other long term (current) drug therapy: Secondary | ICD-10-CM | POA: Diagnosis not present

## 2014-09-14 DIAGNOSIS — G4730 Sleep apnea, unspecified: Secondary | ICD-10-CM

## 2014-09-14 DIAGNOSIS — Z9884 Bariatric surgery status: Secondary | ICD-10-CM | POA: Insufficient documentation

## 2014-09-14 DIAGNOSIS — R079 Chest pain, unspecified: Secondary | ICD-10-CM

## 2014-09-14 DIAGNOSIS — E119 Type 2 diabetes mellitus without complications: Secondary | ICD-10-CM | POA: Diagnosis not present

## 2014-09-14 DIAGNOSIS — C787 Secondary malignant neoplasm of liver and intrahepatic bile duct: Secondary | ICD-10-CM | POA: Diagnosis not present

## 2014-09-14 DIAGNOSIS — K219 Gastro-esophageal reflux disease without esophagitis: Secondary | ICD-10-CM | POA: Diagnosis not present

## 2014-09-14 DIAGNOSIS — R0600 Dyspnea, unspecified: Secondary | ICD-10-CM

## 2014-09-14 DIAGNOSIS — Z85850 Personal history of malignant neoplasm of thyroid: Secondary | ICD-10-CM | POA: Insufficient documentation

## 2014-09-14 DIAGNOSIS — M797 Fibromyalgia: Secondary | ICD-10-CM | POA: Diagnosis not present

## 2014-09-14 DIAGNOSIS — C50919 Malignant neoplasm of unspecified site of unspecified female breast: Secondary | ICD-10-CM | POA: Diagnosis not present

## 2014-09-14 DIAGNOSIS — Z803 Family history of malignant neoplasm of breast: Secondary | ICD-10-CM | POA: Insufficient documentation

## 2014-09-14 DIAGNOSIS — I10 Essential (primary) hypertension: Secondary | ICD-10-CM | POA: Insufficient documentation

## 2014-09-14 DIAGNOSIS — E039 Hypothyroidism, unspecified: Secondary | ICD-10-CM | POA: Diagnosis not present

## 2014-09-14 DIAGNOSIS — I252 Old myocardial infarction: Secondary | ICD-10-CM | POA: Insufficient documentation

## 2014-09-14 LAB — BASIC METABOLIC PANEL
ANION GAP: 11 (ref 5–15)
BUN: 38 mg/dL — ABNORMAL HIGH (ref 6–20)
CHLORIDE: 113 mmol/L — AB (ref 101–111)
CO2: 18 mmol/L — ABNORMAL LOW (ref 22–32)
CREATININE: 1.84 mg/dL — AB (ref 0.44–1.00)
Calcium: 8 mg/dL — ABNORMAL LOW (ref 8.9–10.3)
GFR calc Af Amer: 33 mL/min — ABNORMAL LOW (ref >60)
GFR calc non Af Amer: 29 mL/min — ABNORMAL LOW (ref >60)
Glucose, Bld: 77 mg/dL (ref 65–99)
POTASSIUM: 4.7 mmol/L (ref 3.5–5.1)
Sodium: 142 mmol/L (ref 135–145)

## 2014-09-14 LAB — BRAIN NATRIURETIC PEPTIDE: B Natriuretic Peptide: 102.9 pg/mL — ABNORMAL HIGH (ref 0.0–100.0)

## 2014-09-14 LAB — MAGNESIUM: MAGNESIUM: 1.7 mg/dL (ref 1.7–2.4)

## 2014-09-14 MED ORDER — TEMAZEPAM 15 MG PO CAPS: ORAL_CAPSULE | ORAL | Status: DC

## 2014-09-14 MED ORDER — FUROSEMIDE 20 MG PO TABS: 60.0000 mg | ORAL_TABLET | Freq: Two times a day (BID) | ORAL | Status: DC

## 2014-09-14 NOTE — Patient Instructions (Signed)
INCREASE Lasix to 60 mg (3 tabs) twice a day  Labs today and again in 2 weeks (BMET)  You have been referred to have a VQ scan/CXR- 09/22/14 @ 10:45 AM [place] TOWER/ENTRANCE A  Your physician has recommended that you have a sleep study. This test records several body functions during sleep, including: brain activity, eye movement, oxygen and carbon dioxide blood levels, heart rate and rhythm, breathing rate and rhythm, the flow of air through your mouth and nose, snoring, body muscle movements, and chest and belly movement.  Your physician recommends that you schedule a follow-up appointment in: 3 weeks  Do the following things EVERYDAY: 1) Weigh yourself in the morning before breakfast. Write it down and keep it in a log. 2) Take your medicines as prescribed 3) Eat low salt foods-Limit salt (sodium) to 2000 mg per day.  4) Stay as active as you can everyday 5) Limit all fluids for the day to less than 2 liters 6)

## 2014-09-14 NOTE — Progress Notes (Signed)
Patient ID: Kerri Vasquez, female   DOB: 1955/08/30, 59 y.o.   MRN: [id number] Primary Cardiologist: Dr Haroldine Laws Oncologist: Dr Marin Olp  HPI: Caira Poche is 59 y/o woman with obesity s/p gastric bypass, HTN and breast CA. Underwent chemo and bilateral mastectomies in 2008. It sounds like her chemotherapy was Adriamycin/Cytoxan followed by Taxotere. She was then put on tamoxifen for year and then Femara. She had a previous liver lesion that was ablated.   Unfortunately found to have recurrent liver metastasis and underwent IR ablation in 3/16. Procedure complicated by acute CP and dyspnea with hypotension. Trop peaked at 17. ECHO with EF 20-25% with RWMA. . Cath on 07/14/14 with no significant CAD. LVEDP 10. Echo in 2011 showed normal EF. Prior to discharge struggled with hypotension and required pressors. Could only tolerate low-dose HF meds.  Suspected Takotsubo cardiomyopathy.   She is now getting cancer treatment with palbociclib and Faslodex, neither of which commonly cause cardiac toxicity.   She was readmitted in 4/16 with AKI, creatinine up to 1.73.  Prior to this, she had increased Lasix to 80 mg daily with weight gain and edema.  She was given IV fluid in the hospital with fall in creatinine.  Echo in 4/16 showed improvement in EF to 55-60%, the RV was poorly seen. Lasix was stopped in the hospital.   Since that time, weight has gone up markedly.  At last appointment, weight was up over 50 lbs.  She was only on Lasix 20 mg daily.  I increased her Lasix to 40 mg bid.  She has lost 6 lbs since last appointment.  She is still short of breath walking around her house.  She uses a walker.  She has episodes of sharp, pleuritic chest pain that is nonexertional.  It lasts only for a few seconds at a time and will continue on and off for up to 30 minutes.  +Orthopnea, uses several pillows.  No lightheadedness, no palpitations.    ECG: NSR, poor RWP  Labs (4/16): creatinine 1.7 Labs (5/16): K 4 =>  4.6, creatinine 1.3 => 1.27, LFTs normal, HCT 34.5  ECHO 06/2014: EF 20-25%  ECHO 4/16: EF 55-60%, RV poorly visualized  ROS: All systems negative except as listed in HPI, PMH and Problem List.  SH:  History   Social History  . Marital Status: Divorced    Spouse Name: N/A  . Number of Children: 1  . Years of Education: N/A   Occupational History  . Not on file.   Social History Main Topics  . Smoking status: Never Smoker   . Smokeless tobacco: Never Used     Comment: NEVER USED TOBACCO  . Alcohol Use: No  . Drug Use: No  . Sexual Activity: Not Currently   Other Topics Concern  . Not on file   Social History Narrative   Caffeine use: 3 cups coffee/tea daily   Regular exercise: no          FH:  Family History  Problem Relation Age of Onset  . Cancer Mother     breast  . Hypertension Mother   . Anesthesia problems Daughter     Past Medical History  Diagnosis Date  . Depression   . Hypertension   . Hypothyroidism   . Incisional hernia   . Spinal stenosis   . Scoliosis   . Sciatica   . Intestinal obstruction   . GERD (gastroesophageal reflux disease)   . Septic arthritis 01/28/10    and  spinal stenosis , moderate scoliosis   . Osteoarthritis of multiple joints   . Sleep apnea     hx of off machine x 4 years   . Renal insufficiency     hx renal failure 2011  . Fibromyalgia   . Fracture of multiple toes     right toes x 4 toes- excluding small toe  . Diabetes mellitus     type II, controlled by diet 05/25/11   . CHF (congestive heart failure)   . Anemia   . Pernicious anemia 05/30/2011    Iron transfusion and VB12 on 06/06/11  . Migraine     "weekly; but they don't last long" (08/26/2014)  . Chronic lower back pain   . Breast cancer, right breast   . Liver cancer     "twice"  . Thyroid cancer   . Myocardial infarction 06/2014    S/P percutaneous intervention procedure a metastatic lesion in the liver    Current Outpatient Prescriptions  Medication  Sig Dispense Refill  . acetaminophen (TYLENOL) 500 MG tablet Take 1,000 mg by mouth every 4 (four) hours as needed for moderate pain.    . carvedilol (COREG) 3.125 MG tablet Take 1 tablet (3.125 mg total) by mouth 2 (two) times daily with a meal. 60 tablet 1  . FLUoxetine (PROZAC) 40 MG capsule Take 40 mg by mouth daily before breakfast.     . furosemide (LASIX) 20 MG tablet Take 3 tablets (60 mg total) by mouth 2 (two) times daily. 180 tablet 3  . levothyroxine (SYNTHROID, LEVOTHROID) 25 MCG tablet Take 11 tablets (275 mcg total) by mouth daily before breakfast. (Patient taking differently: Take 300 mcg by mouth daily before breakfast. ) 30 tablet 1  . LORazepam (ATIVAN) 0.5 MG tablet PLACE 1 TABLET UNDER THE TONGUE EVERY 6 HOURS AS NEEDED FOR NAUSEA 60 tablet 0  . nitroGLYCERIN (NITROSTAT) 0.4 MG SL tablet Place 1 tablet (0.4 mg total) under the tongue every 5 (five) minutes as needed for chest pain. 30 tablet 12  . ondansetron (ZOFRAN) 8 MG tablet Take 1 tablet (8 mg total) by mouth every 8 (eight) hours as needed for nausea or vomiting. 60 tablet 2  . oxycodone (OXY-IR) 5 MG capsule Take 5 mg by mouth every 4 (four) hours as needed.    . OxyCODONE (OXYCONTIN) 20 mg T12A 12 hr tablet Take 20 mg by mouth every 12 (twelve) hours.    Marland Kitchen oxyCODONE-acetaminophen (PERCOCET) 10-325 MG per tablet Take 1 tablet by mouth.    . potassium chloride SA (KLOR-CON M20) 20 MEQ tablet Take 2 tablets (40 mEq total) by mouth 2 (two) times daily. 120 tablet 3  . pregabalin (LYRICA) 200 MG capsule Take 1 capsule (200 mg total) by mouth 2 (two) times daily. 60 capsule 1  . promethazine (PHENERGAN) 25 MG tablet TAKE 1 TABLET BY MOUTH THREE TIMES DAILY AS NEEDED FOR NAUSEA 90 tablet 0  . spironolactone (ALDACTONE) 25 MG tablet Take 1 tablet (25 mg total) by mouth daily. 30 tablet 3  . temazepam (RESTORIL) 15 MG capsule TAKE 1 CAPSULE EVERY BEDTIME AS NEEDED FOR SLEEP--IF NEEDED CAN TAKE 1 ADDITIONAL CAPSULE 50 capsule 1   . tiZANidine (ZANAFLEX) 4 MG tablet Take 4 mg by mouth every 6 (six) hours as needed for muscle spasms.     No current facility-administered medications for this encounter.    Filed Vitals:   09/14/14 1139  BP: 112/68  Pulse: 84  Weight: 291 lb (131.997 kg)  SpO2: 99%    PHYSICAL EXAM:  General:  Obese woman sitting in chair. No resp difficulty HEENT: normal Neck: supple. JVP 8-9 cm. Carotids 2+ bilaterally; no bruits. No lymphadenopathy or thryomegaly appreciated. Cor: PMI normal. Regular rate & rhythm. No rubs, gallops or murmurs. Lungs: clear Abdomen: soft, obese nontender, nondistended. No hepatosplenomegaly. No bruits or masses. Good bowel sounds. Extremities: no cyanosis, clubbing, rash, 1+ edema to knees bilaterally. Neuro: alert & orientedx3, cranial nerves grossly intact. Moves all 4 extremities w/o difficulty. Affect pleasant  ASSESSMENT & PLAN: 1. 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 Echo 06/2014 EF 20-25%. Coronary angiography with no CAD.  4/16 echo showed EF improved to 55-60%.   - Would continue Coreg 3.125 mg bid.  2. Chronic diastolic CHF: Patient has developed volume overload despite improvement in EF.   RV was poorly visualized by echo, so I wonder if she does not have prominent RV failure (?OHS/OSA, ?chronic PE with pulmonary hypertension).  At last appointment, I increased Lasix and weight is down 6 lbs.  She is still volume overloaded but looks somewhat improved.  NYHA class IIIb symptoms still.  - Increase home Lasix to 60 mg bid, continue current KCl.    - BMET/BNP today.  - BMET in 2 wks.  - Will arrange V/Q scan to look for chronic PE => still needs to be done.  - Will need sleep study. - Will likely need RHC at some point, can consider this at next appointment if creatinine rising or has trouble diuresing.  - Wear compression stockings during the day.  2. Metastatic breast cancer: s/p IR ablation of liver metastasis.  She is  currently getting palbociclib and Faslodex which do not have prominent risk of cardiotoxicity.  3. Morbid obesity 4. Deconditioning - continue HHPT  Followup in 3 wks.   Loralie Champagne 09/14/2014

## 2014-09-18 ENCOUNTER — Telehealth (HOSPITAL_COMMUNITY): Payer: Self-pay | Admitting: Vascular Surgery

## 2014-09-18 NOTE — Telephone Encounter (Signed)
recert orders

## 2014-09-19 DIAGNOSIS — I5022 Chronic systolic (congestive) heart failure: Secondary | ICD-10-CM | POA: Diagnosis not present

## 2014-09-19 DIAGNOSIS — Z9013 Acquired absence of bilateral breasts and nipples: Secondary | ICD-10-CM | POA: Diagnosis not present

## 2014-09-19 DIAGNOSIS — N189 Chronic kidney disease, unspecified: Secondary | ICD-10-CM | POA: Diagnosis not present

## 2014-09-19 DIAGNOSIS — C787 Secondary malignant neoplasm of liver and intrahepatic bile duct: Secondary | ICD-10-CM | POA: Diagnosis not present

## 2014-09-19 DIAGNOSIS — G894 Chronic pain syndrome: Secondary | ICD-10-CM | POA: Diagnosis not present

## 2014-09-19 DIAGNOSIS — E119 Type 2 diabetes mellitus without complications: Secondary | ICD-10-CM | POA: Diagnosis not present

## 2014-09-19 DIAGNOSIS — I129 Hypertensive chronic kidney disease with stage 1 through stage 4 chronic kidney disease, or unspecified chronic kidney disease: Secondary | ICD-10-CM | POA: Diagnosis not present

## 2014-09-19 DIAGNOSIS — D510 Vitamin B12 deficiency anemia due to intrinsic factor deficiency: Secondary | ICD-10-CM | POA: Diagnosis not present

## 2014-09-19 DIAGNOSIS — C50911 Malignant neoplasm of unspecified site of right female breast: Secondary | ICD-10-CM | POA: Diagnosis not present

## 2014-09-19 DIAGNOSIS — M797 Fibromyalgia: Secondary | ICD-10-CM | POA: Diagnosis not present

## 2014-09-21 DIAGNOSIS — G603 Idiopathic progressive neuropathy: Secondary | ICD-10-CM | POA: Diagnosis not present

## 2014-09-21 DIAGNOSIS — M25532 Pain in left wrist: Secondary | ICD-10-CM | POA: Diagnosis not present

## 2014-09-21 DIAGNOSIS — Z79891 Long term (current) use of opiate analgesic: Secondary | ICD-10-CM | POA: Diagnosis not present

## 2014-09-21 DIAGNOSIS — M4726 Other spondylosis with radiculopathy, lumbar region: Secondary | ICD-10-CM | POA: Diagnosis not present

## 2014-09-21 DIAGNOSIS — G894 Chronic pain syndrome: Secondary | ICD-10-CM | POA: Diagnosis not present

## 2014-09-21 NOTE — Telephone Encounter (Signed)
Orders to continue [facility] given

## 2014-09-22 ENCOUNTER — Ambulatory Visit (HOSPITAL_COMMUNITY): Payer: Medicare Other

## 2014-09-23 ENCOUNTER — Ambulatory Visit: Payer: Medicare Other | Attending: Hematology & Oncology | Admitting: Physical Therapy

## 2014-09-24 ENCOUNTER — Telehealth (HOSPITAL_COMMUNITY): Payer: Self-pay

## 2014-09-24 DIAGNOSIS — C50911 Malignant neoplasm of unspecified site of right female breast: Secondary | ICD-10-CM | POA: Diagnosis not present

## 2014-09-24 DIAGNOSIS — E119 Type 2 diabetes mellitus without complications: Secondary | ICD-10-CM | POA: Diagnosis not present

## 2014-09-24 DIAGNOSIS — D510 Vitamin B12 deficiency anemia due to intrinsic factor deficiency: Secondary | ICD-10-CM | POA: Diagnosis not present

## 2014-09-24 DIAGNOSIS — N189 Chronic kidney disease, unspecified: Secondary | ICD-10-CM | POA: Diagnosis not present

## 2014-09-24 DIAGNOSIS — C787 Secondary malignant neoplasm of liver and intrahepatic bile duct: Secondary | ICD-10-CM | POA: Diagnosis not present

## 2014-09-24 DIAGNOSIS — I129 Hypertensive chronic kidney disease with stage 1 through stage 4 chronic kidney disease, or unspecified chronic kidney disease: Secondary | ICD-10-CM | POA: Diagnosis not present

## 2014-09-24 NOTE — Telephone Encounter (Signed)
Patient had recent lab work in HF clinic on 09/14/14 and reviewed by Dr. Aundra Dubin with recommendations as follows... "Creatinine is higher, magnesium is lower. Would keep Lasix at 40 mg bid and repeat BMET in 1 week (would not increase to 60 mg bid as recommended at visit today). Would add magnesium oxide 200 mg daily to her regimen. Looks like she may end up needing RHC soon, would like her to come back next week to discuss [place]." Patient made aware of these changes.  Lab and RHC discussion will be added to appointment reminder for her upcoming appointment with HF clinic next week.  Renee Pain

## 2014-09-25 ENCOUNTER — Ambulatory Visit (HOSPITAL_COMMUNITY)
Admission: RE | Admit: 2014-09-25 | Discharge: 2014-09-25 | Disposition: A | Discharge status: Home / Self Care | Payer: Medicare Other | Source: Ambulatory Visit | Attending: Family | Admitting: Family

## 2014-09-25 DIAGNOSIS — J90 Pleural effusion, not elsewhere classified: Secondary | ICD-10-CM | POA: Diagnosis not present

## 2014-09-25 DIAGNOSIS — K439 Ventral hernia without obstruction or gangrene: Secondary | ICD-10-CM | POA: Diagnosis not present

## 2014-09-25 DIAGNOSIS — N200 Calculus of kidney: Secondary | ICD-10-CM | POA: Insufficient documentation

## 2014-09-25 DIAGNOSIS — C50919 Malignant neoplasm of unspecified site of unspecified female breast: Secondary | ICD-10-CM | POA: Insufficient documentation

## 2014-09-25 DIAGNOSIS — R933 Abnormal findings on diagnostic imaging of other parts of digestive tract: Secondary | ICD-10-CM | POA: Diagnosis not present

## 2014-09-25 DIAGNOSIS — C787 Secondary malignant neoplasm of liver and intrahepatic bile duct: Secondary | ICD-10-CM | POA: Diagnosis not present

## 2014-09-25 DIAGNOSIS — I517 Cardiomegaly: Secondary | ICD-10-CM | POA: Diagnosis not present

## 2014-09-25 DIAGNOSIS — Z79899 Other long term (current) drug therapy: Secondary | ICD-10-CM | POA: Insufficient documentation

## 2014-09-25 LAB — GLUCOSE, CAPILLARY: GLUCOSE-CAPILLARY: 66 mg/dL (ref 65–99)

## 2014-09-25 MED ORDER — FLUDEOXYGLUCOSE F - 18 (FDG) INJECTION
12.6900 | Freq: Once | INTRAVENOUS | Status: AC | PRN
  Administered 2014-09-25: 12.69 via INTRAVENOUS

## 2014-09-29 DIAGNOSIS — N189 Chronic kidney disease, unspecified: Secondary | ICD-10-CM | POA: Diagnosis not present

## 2014-09-29 DIAGNOSIS — E119 Type 2 diabetes mellitus without complications: Secondary | ICD-10-CM | POA: Diagnosis not present

## 2014-09-29 DIAGNOSIS — I129 Hypertensive chronic kidney disease with stage 1 through stage 4 chronic kidney disease, or unspecified chronic kidney disease: Secondary | ICD-10-CM | POA: Diagnosis not present

## 2014-09-29 DIAGNOSIS — C787 Secondary malignant neoplasm of liver and intrahepatic bile duct: Secondary | ICD-10-CM | POA: Diagnosis not present

## 2014-09-29 DIAGNOSIS — D510 Vitamin B12 deficiency anemia due to intrinsic factor deficiency: Secondary | ICD-10-CM | POA: Diagnosis not present

## 2014-09-29 DIAGNOSIS — C50911 Malignant neoplasm of unspecified site of right female breast: Secondary | ICD-10-CM | POA: Diagnosis not present

## 2014-09-30 ENCOUNTER — Other Ambulatory Visit (HOSPITAL_COMMUNITY): Payer: Self-pay

## 2014-09-30 ENCOUNTER — Encounter: Payer: Self-pay | Admitting: Hematology & Oncology

## 2014-09-30 ENCOUNTER — Ambulatory Visit (HOSPITAL_COMMUNITY)
Admission: RE | Admit: 2014-09-30 | Discharge: 2014-09-30 | Disposition: A | Discharge status: Home / Self Care | Payer: Medicare Other | Source: Ambulatory Visit | Attending: Cardiology | Admitting: Cardiology

## 2014-09-30 DIAGNOSIS — R0600 Dyspnea, unspecified: Secondary | ICD-10-CM | POA: Diagnosis not present

## 2014-09-30 DIAGNOSIS — R0609 Other forms of dyspnea: Secondary | ICD-10-CM | POA: Insufficient documentation

## 2014-09-30 DIAGNOSIS — I252 Old myocardial infarction: Secondary | ICD-10-CM | POA: Diagnosis not present

## 2014-09-30 DIAGNOSIS — Z853 Personal history of malignant neoplasm of breast: Secondary | ICD-10-CM | POA: Insufficient documentation

## 2014-09-30 DIAGNOSIS — C787 Secondary malignant neoplasm of liver and intrahepatic bile duct: Secondary | ICD-10-CM | POA: Diagnosis not present

## 2014-09-30 DIAGNOSIS — J9811 Atelectasis: Secondary | ICD-10-CM | POA: Diagnosis not present

## 2014-09-30 DIAGNOSIS — C7981 Secondary malignant neoplasm of breast: Secondary | ICD-10-CM | POA: Diagnosis not present

## 2014-09-30 DIAGNOSIS — C801 Malignant (primary) neoplasm, unspecified: Secondary | ICD-10-CM | POA: Diagnosis not present

## 2014-09-30 MED ORDER — TECHNETIUM TC 99M DIETHYLENETRIAME-PENTAACETIC ACID: 44.0000 | Freq: Once | INTRAVENOUS | Status: AC | PRN

## 2014-09-30 MED ORDER — TECHNETIUM TO 99M ALBUMIN AGGREGATED
6.0000 | Freq: Once | INTRAVENOUS | Status: AC | PRN
  Administered 2014-09-30: 6 via INTRAVENOUS

## 2014-10-05 ENCOUNTER — Ambulatory Visit (HOSPITAL_BASED_OUTPATIENT_CLINIC_OR_DEPARTMENT_OTHER): Payer: Medicare Other | Admitting: Hematology & Oncology

## 2014-10-05 ENCOUNTER — Other Ambulatory Visit (HOSPITAL_BASED_OUTPATIENT_CLINIC_OR_DEPARTMENT_OTHER): Payer: Medicare Other

## 2014-10-05 ENCOUNTER — Ambulatory Visit (HOSPITAL_BASED_OUTPATIENT_CLINIC_OR_DEPARTMENT_OTHER): Payer: Medicare Other

## 2014-10-05 ENCOUNTER — Encounter: Payer: Self-pay | Admitting: Hematology & Oncology

## 2014-10-05 VITALS — BP 107/45 | HR 80 | Temp 98.0°F | Resp 18 | Ht 66.0 in | Wt 265.0 lb

## 2014-10-05 DIAGNOSIS — C787 Secondary malignant neoplasm of liver and intrahepatic bile duct: Secondary | ICD-10-CM | POA: Diagnosis not present

## 2014-10-05 DIAGNOSIS — Z5111 Encounter for antineoplastic chemotherapy: Secondary | ICD-10-CM | POA: Diagnosis present

## 2014-10-05 DIAGNOSIS — R52 Pain, unspecified: Secondary | ICD-10-CM

## 2014-10-05 DIAGNOSIS — C50919 Malignant neoplasm of unspecified site of unspecified female breast: Secondary | ICD-10-CM

## 2014-10-05 DIAGNOSIS — D510 Vitamin B12 deficiency anemia due to intrinsic factor deficiency: Secondary | ICD-10-CM

## 2014-10-05 DIAGNOSIS — D500 Iron deficiency anemia secondary to blood loss (chronic): Secondary | ICD-10-CM

## 2014-10-05 DIAGNOSIS — C50911 Malignant neoplasm of unspecified site of right female breast: Secondary | ICD-10-CM

## 2014-10-05 LAB — CBC WITH DIFFERENTIAL (CANCER CENTER ONLY)
BASO#: 0 10*3/uL (ref 0.0–0.2)
BASO%: 1 % (ref 0.0–2.0)
EOS%: 2.1 % (ref 0.0–7.0)
Eosinophils Absolute: 0.1 10*3/uL (ref 0.0–0.5)
HEMATOCRIT: 27.8 % — AB (ref 34.8–46.6)
HGB: 8.8 g/dL — ABNORMAL LOW (ref 11.6–15.9)
LYMPH#: 0.9 10*3/uL (ref 0.9–3.3)
LYMPH%: 23.1 % (ref 14.0–48.0)
MCH: 34.1 pg — ABNORMAL HIGH (ref 26.0–34.0)
MCHC: 31.7 g/dL — AB (ref 32.0–36.0)
MCV: 108 fL — AB (ref 81–101)
MONO#: 0.4 10*3/uL (ref 0.1–0.9)
MONO%: 9.8 % (ref 0.0–13.0)
NEUT%: 64 % (ref 39.6–80.0)
NEUTROS ABS: 2.5 10*3/uL (ref 1.5–6.5)
PLATELETS: 135 10*3/uL — AB (ref 145–400)
RBC: 2.58 10*6/uL — ABNORMAL LOW (ref 3.70–5.32)
RDW: 15.6 % (ref 11.1–15.7)
WBC: 3.9 10*3/uL (ref 3.9–10.0)

## 2014-10-05 LAB — COMPREHENSIVE METABOLIC PANEL
ALK PHOS: 96 U/L (ref 39–117)
ALT: 10 U/L (ref 0–35)
AST: 13 U/L (ref 0–37)
Albumin: 3.3 g/dL — ABNORMAL LOW (ref 3.5–5.2)
BUN: 33 mg/dL — AB (ref 6–23)
CHLORIDE: 109 meq/L (ref 96–112)
CO2: 24 meq/L (ref 19–32)
Calcium: 8.2 mg/dL — ABNORMAL LOW (ref 8.4–10.5)
Creatinine, Ser: 1.47 mg/dL — ABNORMAL HIGH (ref 0.50–1.10)
GLUCOSE: 90 mg/dL (ref 70–99)
Potassium: 4.5 mEq/L (ref 3.5–5.3)
SODIUM: 142 meq/L (ref 135–145)
Total Bilirubin: 0.6 mg/dL (ref 0.2–1.2)
Total Protein: 6.1 g/dL (ref 6.0–8.3)

## 2014-10-05 LAB — CANCER ANTIGEN 27.29: CA 27.29: 37 U/mL (ref 0–39)

## 2014-10-05 LAB — CHCC SATELLITE - SMEAR

## 2014-10-05 MED ORDER — HYDROMORPHONE HCL 1 MG/ML IJ SOLN
1.0000 mg | Freq: Once | INTRAMUSCULAR | Status: AC
  Administered 2014-10-05: 1 mg via SUBCUTANEOUS

## 2014-10-05 MED ORDER — LEVOTHYROXINE SODIUM 300 MCG PO TABS: 300.0000 ug | ORAL_TABLET | Freq: Every day | ORAL | Status: DC

## 2014-10-05 MED ORDER — EVEROLIMUS 5 MG PO TABS: 5.0000 mg | ORAL_TABLET | Freq: Every day | ORAL | Status: DC

## 2014-10-05 MED ORDER — LORAZEPAM 0.5 MG PO TABS: ORAL_TABLET | ORAL | Status: DC

## 2014-10-05 MED ORDER — CYANOCOBALAMIN 1000 MCG/ML IJ SOLN
1000.0000 ug | Freq: Once | INTRAMUSCULAR | Status: AC
  Administered 2014-10-05: 1000 ug via INTRAMUSCULAR

## 2014-10-05 MED ORDER — HYDROMORPHONE HCL 1 MG/ML IJ SOLN
INTRAMUSCULAR | Status: AC
  Filled 2014-10-05: qty 1

## 2014-10-05 MED ORDER — CYANOCOBALAMIN 1000 MCG/ML IJ SOLN
INTRAMUSCULAR | Status: AC
  Filled 2014-10-05: qty 1

## 2014-10-05 MED ORDER — FULVESTRANT 250 MG/5ML IM SOLN
500.0000 mg | Freq: Once | INTRAMUSCULAR | Status: AC
  Administered 2014-10-05: 500 mg via INTRAMUSCULAR

## 2014-10-05 MED ORDER — FULVESTRANT 250 MG/5ML IM SOLN
INTRAMUSCULAR | Status: AC
  Filled 2014-10-05: qty 10

## 2014-10-05 NOTE — Patient Instructions (Signed)
Cyanocobalamin, Vitamin B12 injection What is this medicine? CYANOCOBALAMIN (sye an oh koe BAL a min) is a man made form of vitamin B12. Vitamin B12 is used in the growth of healthy blood cells, nerve cells, and proteins in the body. It also helps with the metabolism of fats and carbohydrates. This medicine is used to treat people who can not absorb vitamin B12. This medicine may be used for other purposes; ask your health care provider or pharmacist if you have questions. COMMON BRAND NAME(S): Cyomin, LA-12, Nutri-Twelve, Primabalt What should I tell my health care provider before I take this medicine? They need to know if you have any of these conditions: -kidney disease -Leber's disease -megaloblastic anemia -an unusual or allergic reaction to cyanocobalamin, cobalt, other medicines, foods, dyes, or preservatives -pregnant or trying to get pregnant -breast-feeding How should I use this medicine? This medicine is injected into a muscle or deeply under the skin. It is usually given by a health care professional in a clinic or doctor's office. However, your doctor may teach you how to inject yourself. Follow all instructions. Talk to your pediatrician regarding the use of this medicine in children. Special care may be needed. Overdosage: If you think you have taken too much of this medicine contact a poison control center or emergency room at once. NOTE: This medicine is only for you. Do not share this medicine with others. What if I miss a dose? If you are given your dose at a clinic or doctor's office, call to reschedule your appointment. If you give your own injections and you miss a dose, take it as soon as you can. If it is almost time for your next dose, take only that dose. Do not take double or extra doses. What may interact with this medicine? -colchicine -heavy alcohol intake This list may not describe all possible interactions. Give your health care provider a list of all the  medicines, herbs, non-prescription drugs, or dietary supplements you use. Also tell them if you smoke, drink alcohol, or use illegal drugs. Some items may interact with your medicine. What should I watch for while using this medicine? Visit your doctor or health care professional regularly. You may need blood work done while you are taking this medicine. You may need to follow a special diet. Talk to your doctor. Limit your alcohol intake and avoid smoking to get the best benefit. What side effects may I notice from receiving this medicine? Side effects that you should report to your doctor or health care professional as soon as possible: -allergic reactions like skin rash, itching or hives, swelling of the face, lips, or tongue -blue tint to skin -chest tightness, pain -difficulty breathing, wheezing -dizziness -red, swollen painful area on the leg Side effects that usually do not require medical attention (report to your doctor or health care professional if they continue or are bothersome): -diarrhea -headache This list may not describe all possible side effects. Call your doctor for medical advice about side effects. You may report side effects to FDA at 1-800-FDA-1088. Where should I keep my medicine? Keep out of the reach of children. Store at room temperature between 15 and 30 degrees C (59 and 85 degrees F). Protect from light. Throw away any unused medicine after the expiration date. NOTE: This sheet is a summary. It may not cover all possible information. If you have questions about this medicine, talk to your doctor, pharmacist, or health care provider.  2015, Elsevier/Gold Standard. (2007-07-29 22:10:20) Fulvestrant  injection What is this medicine? FULVESTRANT (ful VES trant) blocks the effects of estrogen. It is used to treat breast cancer in women past the age of menopause. This medicine may be used for other purposes; ask your health care provider or pharmacist if you have  questions. COMMON BRAND NAME(S): FASLODEX What should I tell my health care provider before I take this medicine? They need to know if you have any of these conditions: -bleeding problems -liver disease -low levels of platelets in the blood -an unusual or allergic reaction to fulvestrant, other medicines, foods, dyes, or preservatives -pregnant or trying to get pregnant -breast-feeding How should I use this medicine? This medicine is for injection into a muscle. It is usually given by a health care professional in a hospital or clinic setting. Talk to your pediatrician regarding the use of this medicine in children. Special care may be needed. Overdosage: If you think you have taken too much of this medicine contact a poison control center or emergency room at once. NOTE: This medicine is only for you. Do not share this medicine with others. What if I miss a dose? It is important not to miss your dose. Call your doctor or health care professional if you are unable to keep an appointment. What may interact with this medicine? -medicines that treat or prevent blood clots like warfarin, enoxaparin, and dalteparin This list may not describe all possible interactions. Give your health care provider a list of all the medicines, herbs, non-prescription drugs, or dietary supplements you use. Also tell them if you smoke, drink alcohol, or use illegal drugs. Some items may interact with your medicine. What should I watch for while using this medicine? Your condition will be monitored carefully while you are receiving this medicine. You will need important blood work done while you are taking this medicine. Do not become pregnant while taking this medicine. Women should inform their doctor if they wish to become pregnant or think they might be pregnant. There is a potential for serious side effects to an unborn child. Talk to your health care professional or pharmacist for more information. What side  effects may I notice from receiving this medicine? Side effects that you should report to your doctor or health care professional as soon as possible: -allergic reactions like skin rash, itching or hives, swelling of the face, lips, or tongue -feeling faint or lightheaded, falls -fever or flu-like symptoms -sore throat -vaginal bleeding Side effects that usually do not require medical attention (report to your doctor or health care professional if they continue or are bothersome): -aches, pains -constipation or diarrhea -headache -hot flashes -nausea, vomiting -pain at site where injected -stomach pain This list may not describe all possible side effects. Call your doctor for medical advice about side effects. You may report side effects to FDA at 1-800-FDA-1088. Where should I keep my medicine? This drug is given in a hospital or clinic and will not be stored at home. NOTE: This sheet is a summary. It may not cover all possible information. If you have questions about this medicine, talk to your doctor, pharmacist, or health care provider.  2015, Elsevier/Gold Standard. (2007-08-26 15:39:24) Hydromorphone injection What is this medicine? HYDROMORPHONE (hye droe MOR fone) is a pain reliever. It is used to treat moderate to severe pain. This medicine may be used for other purposes; ask your health care provider or pharmacist if you have questions. COMMON BRAND NAME(S): Dilaudid, Dilaudid-HP What should I tell my health care provider  before I take this medicine? They need to know if you have any of these conditions: -brain tumor -drug abuse or addiction -head injury -heart disease -frequently drink alcohol containing drinks -kidney disease -liver disease -lung disease, asthma, or breathing problems -an allergic or unusual reaction to hydromorphone, other opioid analgesics, latex, other medicines, foods, dyes, or preservatives -pregnant or trying to get  pregnant -breast-feeding How should I use this medicine? This medicine is for injection into a vein, into a muscle, or under the skin. It is usually given by a health care professional in a hospital or clinic setting. If you get this medicine at home, you will be taught how to prepare and give this medicine. Use exactly as directed. Take your medicine at regular intervals. Do not take your medicine more often than directed. It is important that you put your used needles and syringes in a special sharps container. Do not put them in a trash can. If you do not have a sharps container, call your pharmacist or healthcare provider to get one. Talk to your pediatrician regarding the use of this medicine in children. This medicine is not approved for use in children. Overdosage: If you think you have taken too much of this medicine contact a poison control center or emergency room at once. NOTE: This medicine is only for you. Do not share this medicine with others. What if I miss a dose? If you miss a dose, use it as soon as you can. If it is almost time for your next dose, use only that dose. Do not use double or extra doses. What may interact with this medicine? -alcohol -antihistamines for allergy, cough and cold -medicines for anesthesia -medicines for depression, anxiety, or psychotic disturbances -medicines for sleep -muscle relaxants -naltrexone -narcotic medicines (opiates) for pain -phenothiazines like chlorpromazine, mesoridazine, prochlorperazine, thioridazine -tramadol This list may not describe all possible interactions. Give your health care provider a list of all the medicines, herbs, non-prescription drugs, or dietary supplements you use. Also tell them if you smoke, drink alcohol, or use illegal drugs. Some items may interact with your medicine. What should I watch for while using this medicine? Tell your doctor or health care professional if your pain does not go away, if it gets  worse, or if you have new or a different type of pain. You may develop tolerance to the medicine. Tolerance means that you will need a higher dose of the medicine for pain relief. Tolerance is normal and is expected if you take this medicine for a long time. Do not suddenly stop taking your medicine because you may develop a severe reaction. Your body becomes used to the medicine. This does NOT mean you are addicted. Addiction is a behavior related to getting and using a drug for a non-medical reason. If you have pain, you have a medical reason to take pain medicine. Your doctor will tell you how much medicine to take. If your doctor wants you to stop the medicine, the dose will be slowly lowered over time to avoid any side effects. You may get drowsy or dizzy. Do not drive, use machinery, or do anything that needs mental alertness until you know how this medicine affects you. Do not stand or sit up quickly, especially if you are an older patient. This reduces the risk of dizzy or fainting spells. Alcohol may interfere with the effect of this medicine. Avoid alcoholic drinks. There are different types of narcotic medicines (opiates) for pain. If you take  more than one type at the same time, you may have more side effects. Give your health care provider a list of all medicines you use. Your doctor will tell you how much medicine to take. Do not take more medicine than directed. Call emergency for help if you have problems breathing. This medicine will cause constipation. Try to have a bowel movement at least every 2 to 3 days. If you do not have a bowel movement for 3 days, call your doctor or health care professional. Your mouth may get dry. Chewing sugarless gum or sucking hard candy, and drinking plenty of water may help. Contact your doctor if the problem does not go away or is severe. What side effects may I notice from receiving this medicine? Side effects that you should report to your doctor or health  care professional as soon as possible: -allergic reactions like skin rash, itching or hives, swelling of the face, lips, or tongue -breathing problems -changes in vision -confusion -feeling faint or lightheaded, falls -seizures -slow or fast heartbeat -trouble passing urine or change in the amount of urine -trouble with balance, talking, walking -unusually weak or tired Side effects that usually do not require medical attention (report to your doctor or health care professional if they continue or are bothersome): -difficulty sleeping -drowsiness -dry mouth -flushing -headache -itching -loss of appetite -nausea, vomiting This list may not describe all possible side effects. Call your doctor for medical advice about side effects. You may report side effects to FDA at 1-800-FDA-1088. Where should I keep my medicine? Keep out of the reach of children. This medicine can be abused. Keep your medicine in a safe place to protect it from theft. Do not share this medicine with anyone. Selling or giving away this medicine is dangerous and against the law. If you are using this medicine at home, you will be instructed on how to store this medicine. This medicine may cause accidental overdose and death if it is taken by other adults, children, or pets. Flush any unused medicine down the toilet to reduce the chance of harm. Do not use the medicine after the expiration date. NOTE: This sheet is a summary. It may not cover all possible information. If you have questions about this medicine, talk to your doctor, pharmacist, or health care provider.  2015, Elsevier/Gold Standard. (2012-11-19 10:47:33)

## 2014-10-06 NOTE — Progress Notes (Signed)
Hematology and Oncology Follow Up Visit  Heath Badon [id number] 1955-06-25 59 y.o. 10/06/2014   Principle Diagnosis:  1. Metastatic breast cancer, progressive liver metastases  2. Gastric bypass for subsequent B12 and iron deficiency 3. Osteoarthritis with chronic pain 4. 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 Faslodex 500 mg IM monthly 2. Afinitor 5 mg by mouth daily 2. Vitamin B12 1 mg IM monthly 3. IV iron as indicated    Interim History:  [name] is here today with her daughter for a follow-up. She still is in a bit of a tough way. She still is having After This Cardiac Issue. I'm Not Sure Exactly What Happened. I Don't Know If This Was Truly a Heart Attack.  We Did a Follow-Up PET Scan on Her. Unfortunately, It Shows That She Has Progressive Liver Disease. She Has New Liver Metastases. I Don't See Singer. She is on Ibrance already. I think we may have to her off that and try something different  Her last tumor marker for CA 27.29 was 37. This is up a little bit. It'll be interesting to see what it is today.  She still has a lot of arthritic issues. She still has a lot of pain problems. She is tired.  She is her vitamin B-12 today.    Medications:    Medication List       This list is accurate as of: 10/05/14 11:59 PM.  Always use your most recent med list.               acetaminophen 500 MG tablet  Commonly known as:  TYLENOL  Take 1,000 mg by mouth every 4 (four) hours as needed for moderate pain.     carvedilol 6.25 MG tablet  Commonly known as:  COREG  Take 6.25 mg by mouth 2 (two) times daily with a meal.     everolimus 5 MG tablet  Commonly known as:  AFINITOR  Take 1 tablet (5 mg total) by mouth daily.     FLUoxetine 40 MG capsule  Commonly known as:  PROZAC  Take 40 mg by mouth daily before breakfast.     furosemide 20 MG tablet  Commonly known as:  LASIX  Take 20 mg by mouth. Take 2 tablets  by mouth twice a day .     levothyroxine 300 MCG tablet  Commonly known as:  SYNTHROID, LEVOTHROID  Take 1 tablet (300 mcg total) by mouth daily before breakfast.     LORazepam 0.5 MG tablet  Commonly known as:  ATIVAN  PLACE 1 TABLET UNDER THE TONGUE EVERY 6 HOURS AS NEEDED FOR NAUSEA     nitroGLYCERIN 0.4 MG SL tablet  Commonly known as:  NITROSTAT  Place 1 tablet (0.4 mg total) under the tongue every 5 (five) minutes as needed for chest pain.     ondansetron 8 MG tablet  Commonly known as:  ZOFRAN  Take 1 tablet (8 mg total) by mouth every 8 (eight) hours as needed for nausea or vomiting.     OxyCODONE 20 mg T12a 12 hr tablet  Commonly known as:  OXYCONTIN  Take 20 mg by mouth every 12 (twelve) hours.     oxycodone 5 MG capsule  Commonly known as:  OXY-IR  Take 5 mg by mouth every 4 (four) hours as needed.     oxyCODONE-acetaminophen 10-325 MG per tablet  Commonly known as:  PERCOCET  Take 1 tablet  by mouth every 6 (six) hours as needed (4 x per day).     palbociclib 100 MG capsule  Commonly known as:  IBRANCE  Take 100 mg by mouth daily with breakfast. Take whole with food. 21 days on 7 days off.     potassium chloride SA 20 MEQ tablet  Commonly known as:  KLOR-CON M20  Take 2 tablets (40 mEq total) by mouth 2 (two) times daily.     pregabalin 200 MG capsule  Commonly known as:  LYRICA  Take 1 capsule (200 mg total) by mouth 2 (two) times daily.     promethazine 25 MG tablet  Commonly known as:  PHENERGAN  TAKE 1 TABLET BY MOUTH THREE TIMES DAILY AS NEEDED FOR NAUSEA     spironolactone 25 MG tablet  Commonly known as:  ALDACTONE  Take 1 tablet (25 mg total) by mouth daily.     temazepam 15 MG capsule  Commonly known as:  RESTORIL  TAKE 1 CAPSULE EVERY BEDTIME AS NEEDED FOR SLEEP--IF NEEDED CAN TAKE 1 ADDITIONAL CAPSULE     tiZANidine 4 MG tablet  Commonly known as:  ZANAFLEX  Take 4 mg by mouth every 6 (six) hours as needed for muscle spasms.         Allergies:  Allergies  Allergen Reactions  . Influenza Vac Split [Flu Virus Vaccine] Other (See Comments)    Joint stiffness and renal failure  . Ritalin [Methylphenidate Hcl]     "Heart attack"  . Zostavax [Zoster Vaccine Live ([employer])] Other (See Comments)    Joint stiffness, renal failure  . Adhesive [Tape] Itching and Rash    Past Medical History, Surgical history, Social history, and Family History were reviewed and updated.  Review of Systems: All other 10 point review of systems is negative.   Physical Exam:  height is '5\' 6"'  (1.676 m) and weight is 265 lb (120.203 kg). Her oral temperature is 98 F (36.7 C). Her blood pressure is 107/45 and her pulse is 80. Her respiration is 18.   Wt Readings from Last 3 Encounters:  10/05/14 265 lb (120.203 kg)  09/14/14 291 lb (131.997 kg)  09/04/14 297 lb 12 oz (135.059 kg)    Ocular: Sclerae unicteric, pupils equal, round and reactive to light Ear-nose-throat: Oropharynx clear, dentition fair Lymphatic: No cervical or supraclavicular adenopathy Lungs no rales or rhonchi, good excursion bilaterally Heart regular rate and rhythm, no murmur appreciated Abd soft, nontender, positive bowel sounds MSK no focal spinal tenderness, no joint edema Neuro: non-focal, well-oriented, appropriate affect Breasts: Has had bilateral mastectomies. No change with chest. No lymphadenopathy.   Lab Results  Component Value Date   WBC 3.9 10/05/2014   HGB 8.8* 10/05/2014   HCT 27.8* 10/05/2014   MCV 108* 10/05/2014   PLT 135* 10/05/2014   Lab Results  Component Value Date   FERRITIN 1,641* 06/29/2014   IRON 111 06/29/2014   TIBC 219* 06/29/2014   UIBC 108* 06/29/2014   IRONPCTSAT 51 06/29/2014   Lab Results  Component Value Date   RETICCTPCT 1.0 06/29/2014   RBC 2.58* 10/05/2014   RETICCTABS 32.3 06/29/2014   No results found for: KPAFRELGTCHN, LAMBDASER, KAPLAMBRATIO No results found for: Kandis Cocking, IGMSERUM Lab Results   Component Value Date   TOTALPROTELP 4.7* 01/31/2010   ALBUMINELP 39.4* 01/31/2010   A1GS 12.1* 01/31/2010   A2GS 20.3* 01/31/2010   BETS 6.0 01/31/2010   BETA2SER 6.8* 01/31/2010   GAMS 15.4 01/31/2010   MSPIKE NOT DETECTED  01/31/2010   SPEI  01/31/2010    (NOTE) The possibility of a faint restricted band(s) cannot be completely excluded in the gamma region.  Suggest serum IFE to evaluate possibility, if clinically indicated. (Lab will hold sample one week. Please call Customer Service at [phone] to  add test.) Reviewed by Odis Hollingshead, MD, PhD, FCAP (Electronic Signature on File)     Chemistry      Component Value Date/Time   NA 142 10/05/2014 1052   NA 137 09/03/2014 1328   K 4.5 10/05/2014 1052   K 4.0 09/03/2014 1328   CL 109 10/05/2014 1052   CL 110* 09/03/2014 1328   CO2 24 10/05/2014 1052   CO2 22 09/03/2014 1328   BUN 33* 10/05/2014 1052   BUN 25* 09/03/2014 1328   CREATININE 1.47* 10/05/2014 1052   CREATININE 1.3* 09/03/2014 1328      Component Value Date/Time   CALCIUM 8.2* 10/05/2014 1052   CALCIUM 8.6 09/03/2014 1328   ALKPHOS 96 10/05/2014 1052   ALKPHOS 113* 09/03/2014 1328   AST 13 10/05/2014 1052   AST 19 09/03/2014 1328   ALT 10 10/05/2014 1052   ALT 13 09/03/2014 1328   BILITOT 0.6 10/05/2014 1052   BILITOT 0.70 09/03/2014 1328     Impression and Plan: Impression and Plan: [name] is 59 year old female with metastatic breast cancer with mets to the liver.   I think that trying Afinitor with fast neck would not be a bad idea. I think she could tolerate this. I did talk about mouth sores. That is why wants use the 5 mg dose.  It may take several months before we know if this is going to work.  If we find that she still has progression, then we will have to consider chemotherapy.  I'm now going to try to use one of the liquid biopsy test to see about her estrogen receptor status, and HER-2 status.  I spent about 40 minutes with  she and her daughter. I have known them for quite a while. I am just very disappointed that she is progressing.    Volanda Napoleon, MD 6/7/201610:44 AM

## 2014-10-07 ENCOUNTER — Encounter (HOSPITAL_COMMUNITY): Payer: Self-pay

## 2014-10-09 ENCOUNTER — Telehealth: Payer: Self-pay | Admitting: Hematology & Oncology

## 2014-10-09 NOTE — Telephone Encounter (Signed)
[facility] has APPROVED the AFINITOR 5 MG TAB  and is good until 10/07/2015.     P:  [phone] F:  [phone]      COPY SCANNED

## 2014-10-14 ENCOUNTER — Other Ambulatory Visit (HOSPITAL_COMMUNITY): Payer: Self-pay

## 2014-10-16 DIAGNOSIS — M25532 Pain in left wrist: Secondary | ICD-10-CM | POA: Diagnosis not present

## 2014-10-16 DIAGNOSIS — G603 Idiopathic progressive neuropathy: Secondary | ICD-10-CM | POA: Diagnosis not present

## 2014-10-16 DIAGNOSIS — M4726 Other spondylosis with radiculopathy, lumbar region: Secondary | ICD-10-CM | POA: Diagnosis not present

## 2014-10-16 DIAGNOSIS — G894 Chronic pain syndrome: Secondary | ICD-10-CM | POA: Diagnosis not present

## 2014-10-19 DIAGNOSIS — I129 Hypertensive chronic kidney disease with stage 1 through stage 4 chronic kidney disease, or unspecified chronic kidney disease: Secondary | ICD-10-CM | POA: Diagnosis not present

## 2014-10-19 DIAGNOSIS — D510 Vitamin B12 deficiency anemia due to intrinsic factor deficiency: Secondary | ICD-10-CM | POA: Diagnosis not present

## 2014-10-19 DIAGNOSIS — N189 Chronic kidney disease, unspecified: Secondary | ICD-10-CM | POA: Diagnosis not present

## 2014-10-19 DIAGNOSIS — C787 Secondary malignant neoplasm of liver and intrahepatic bile duct: Secondary | ICD-10-CM | POA: Diagnosis not present

## 2014-10-19 DIAGNOSIS — C50911 Malignant neoplasm of unspecified site of right female breast: Secondary | ICD-10-CM | POA: Diagnosis not present

## 2014-10-19 DIAGNOSIS — E119 Type 2 diabetes mellitus without complications: Secondary | ICD-10-CM | POA: Diagnosis not present

## 2014-10-27 ENCOUNTER — Encounter (HOSPITAL_BASED_OUTPATIENT_CLINIC_OR_DEPARTMENT_OTHER): Payer: Self-pay

## 2014-10-29 ENCOUNTER — Encounter (HOSPITAL_COMMUNITY): Payer: Self-pay

## 2014-11-05 ENCOUNTER — Inpatient Hospital Stay: Payer: Self-pay

## 2014-11-05 ENCOUNTER — Ambulatory Visit: Payer: Self-pay | Admitting: Hematology & Oncology

## 2014-11-05 ENCOUNTER — Other Ambulatory Visit: Payer: Self-pay

## 2014-11-06 ENCOUNTER — Encounter (HOSPITAL_BASED_OUTPATIENT_CLINIC_OR_DEPARTMENT_OTHER): Payer: Self-pay

## 2014-11-07 ENCOUNTER — Other Ambulatory Visit: Payer: Self-pay | Admitting: Hematology & Oncology

## 2014-11-09 ENCOUNTER — Ambulatory Visit: Payer: Medicare Other | Admitting: Hematology & Oncology

## 2014-11-09 ENCOUNTER — Ambulatory Visit: Payer: Medicare Other

## 2014-11-09 ENCOUNTER — Other Ambulatory Visit: Payer: Medicare Other

## 2014-11-13 ENCOUNTER — Other Ambulatory Visit: Payer: Self-pay

## 2014-11-13 DIAGNOSIS — C787 Secondary malignant neoplasm of liver and intrahepatic bile duct: Principal | ICD-10-CM

## 2014-11-13 DIAGNOSIS — D510 Vitamin B12 deficiency anemia due to intrinsic factor deficiency: Secondary | ICD-10-CM

## 2014-11-13 DIAGNOSIS — C50911 Malignant neoplasm of unspecified site of right female breast: Secondary | ICD-10-CM

## 2014-11-16 ENCOUNTER — Other Ambulatory Visit: Payer: Medicare Other

## 2014-11-16 ENCOUNTER — Ambulatory Visit: Payer: Medicare Other | Admitting: Family

## 2014-11-16 ENCOUNTER — Ambulatory Visit: Payer: Medicare Other

## 2014-11-19 ENCOUNTER — Telehealth: Payer: Self-pay | Admitting: Hematology & Oncology

## 2014-11-19 NOTE — Telephone Encounter (Signed)
Pt's daughter called to reschedule missed appt for 7/25

## 2014-11-20 ENCOUNTER — Encounter: Payer: Self-pay | Admitting: Family

## 2014-11-23 ENCOUNTER — Ambulatory Visit (HOSPITAL_BASED_OUTPATIENT_CLINIC_OR_DEPARTMENT_OTHER): Payer: Medicare Other | Admitting: Hematology & Oncology

## 2014-11-23 ENCOUNTER — Ambulatory Visit (HOSPITAL_BASED_OUTPATIENT_CLINIC_OR_DEPARTMENT_OTHER): Payer: Medicare Other

## 2014-11-23 ENCOUNTER — Other Ambulatory Visit (HOSPITAL_BASED_OUTPATIENT_CLINIC_OR_DEPARTMENT_OTHER): Payer: Medicare Other

## 2014-11-23 ENCOUNTER — Other Ambulatory Visit: Payer: Self-pay | Admitting: *Deleted

## 2014-11-23 ENCOUNTER — Encounter: Payer: Self-pay | Admitting: Hematology & Oncology

## 2014-11-23 VITALS — BP 93/49 | HR 71 | Temp 98.1°F | Resp 14 | Ht 66.0 in | Wt 233.0 lb

## 2014-11-23 DIAGNOSIS — D510 Vitamin B12 deficiency anemia due to intrinsic factor deficiency: Secondary | ICD-10-CM

## 2014-11-23 DIAGNOSIS — N182 Chronic kidney disease, stage 2 (mild): Secondary | ICD-10-CM

## 2014-11-23 DIAGNOSIS — C50911 Malignant neoplasm of unspecified site of right female breast: Secondary | ICD-10-CM

## 2014-11-23 DIAGNOSIS — C50919 Malignant neoplasm of unspecified site of unspecified female breast: Secondary | ICD-10-CM | POA: Diagnosis not present

## 2014-11-23 DIAGNOSIS — D6481 Anemia due to antineoplastic chemotherapy: Secondary | ICD-10-CM | POA: Diagnosis not present

## 2014-11-23 DIAGNOSIS — Z5111 Encounter for antineoplastic chemotherapy: Secondary | ICD-10-CM

## 2014-11-23 DIAGNOSIS — D631 Anemia in chronic kidney disease: Secondary | ICD-10-CM

## 2014-11-23 DIAGNOSIS — T451X5A Adverse effect of antineoplastic and immunosuppressive drugs, initial encounter: Principal | ICD-10-CM

## 2014-11-23 DIAGNOSIS — C787 Secondary malignant neoplasm of liver and intrahepatic bile duct: Secondary | ICD-10-CM

## 2014-11-23 LAB — CMP (CANCER CENTER ONLY)
ALBUMIN: 2.9 g/dL — AB (ref 3.3–5.5)
ALT: 15 U/L (ref 10–47)
AST: 27 U/L (ref 11–38)
Alkaline Phosphatase: 75 U/L (ref 26–84)
BILIRUBIN TOTAL: 0.5 mg/dL (ref 0.20–1.60)
BUN, Bld: 34 mg/dL — ABNORMAL HIGH (ref 7–22)
CALCIUM: 9.3 mg/dL (ref 8.0–10.3)
CHLORIDE: 112 meq/L — AB (ref 98–108)
CO2: 18 meq/L (ref 18–33)
CREATININE: 1.7 mg/dL — AB (ref 0.6–1.2)
GLUCOSE: 131 mg/dL — AB (ref 73–118)
POTASSIUM: 3.8 meq/L (ref 3.3–4.7)
SODIUM: 142 meq/L (ref 128–145)
Total Protein: 7.1 g/dL (ref 6.4–8.1)

## 2014-11-23 LAB — CBC WITH DIFFERENTIAL (CANCER CENTER ONLY)
BASO#: 0 10*3/uL (ref 0.0–0.2)
BASO%: 0.9 % (ref 0.0–2.0)
EOS ABS: 0.3 10*3/uL (ref 0.0–0.5)
EOS%: 7.3 % — ABNORMAL HIGH (ref 0.0–7.0)
HCT: 29 % — ABNORMAL LOW (ref 34.8–46.6)
HGB: 9.4 g/dL — ABNORMAL LOW (ref 11.6–15.9)
LYMPH#: 0.8 10*3/uL — ABNORMAL LOW (ref 0.9–3.3)
LYMPH%: 17.2 % (ref 14.0–48.0)
MCH: 31.1 pg (ref 26.0–34.0)
MCHC: 32.4 g/dL (ref 32.0–36.0)
MCV: 96 fL (ref 81–101)
MONO#: 0.5 10*3/uL (ref 0.1–0.9)
MONO%: 10.9 % (ref 0.0–13.0)
NEUT#: 3 10*3/uL (ref 1.5–6.5)
NEUT%: 63.7 % (ref 39.6–80.0)
Platelets: 112 10*3/uL — ABNORMAL LOW (ref 145–400)
RBC: 3.02 10*6/uL — ABNORMAL LOW (ref 3.70–5.32)
RDW: 13.7 % (ref 11.1–15.7)
WBC: 4.7 10*3/uL (ref 3.9–10.0)

## 2014-11-23 LAB — IRON AND TIBC CHCC
%SAT: 21 % (ref 21–57)
Iron: 42 ug/dL (ref 41–142)
TIBC: 204 ug/dL — ABNORMAL LOW (ref 236–444)
UIBC: 161 ug/dL (ref 120–384)

## 2014-11-23 LAB — CHCC SATELLITE - SMEAR

## 2014-11-23 MED ORDER — FULVESTRANT 250 MG/5ML IM SOLN
500.0000 mg | Freq: Once | INTRAMUSCULAR | Status: AC
  Administered 2014-11-23: 500 mg via INTRAMUSCULAR

## 2014-11-23 MED ORDER — FULVESTRANT 250 MG/5ML IM SOLN
INTRAMUSCULAR | Status: AC
  Filled 2014-11-23: qty 10

## 2014-11-23 MED ORDER — CYANOCOBALAMIN 1000 MCG/ML IJ SOLN
1000.0000 ug | Freq: Once | INTRAMUSCULAR | Status: AC
  Administered 2014-11-23: 1000 ug via INTRAMUSCULAR

## 2014-11-23 MED ORDER — CYANOCOBALAMIN 1000 MCG/ML IJ SOLN
INTRAMUSCULAR | Status: AC
  Filled 2014-11-23: qty 1

## 2014-11-23 NOTE — Progress Notes (Signed)
Hematology and Oncology Follow Up Visit  Kerri Vasquez [id number] June 05, 1955 59 y.o. 11/23/2014   Principle Diagnosis:  1. Metastatic breast cancer, progressive liver metastases  2. Gastric bypass for subsequent B12 and iron deficiency 3. Osteoarthritis with chronic pain 4. 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 Faslodex 500 mg IM monthly 2. Afinitor 5 mg by mouth daily 2. Vitamin B12 1 mg IM monthly 3. IV iron as indicated    Interim History:  Kerri Vasquez is here today with her daughter for a follow-up. She is and will chair. She still has a lot of issues with her heart. She also has the progressive metastatic breast cancer. There is also a lot of pain issues from her arthritis.  We started her on Afinitor. She seems be doing okay with this. She has occasional mouth sores but nothing that seems to be lasting.  She has had no fever. There's been no bleeding.  This swelling in her legs appears be better. She has weakness in her legs. She has arthritic issues.  Her blood pressure has been a little on the lower side. I told her daughter to stop given her the Aldactone. I also told her daughter to only do the Lasix once a day and not twice a day. She goes back to see the cardiologist I think next week and maybe they will be able to further refine her medications.  She still has a lot of pain problems. She is having her pain medications really adjusted by her pain doctor.  She, in my mind, is totally disabled. Is also no way that she is able to work because of her metastatic breast cancer and also her cardiac issues. This,, the arthritis that she is doing with makes her pretty much incapable of any significant employment.  She is chronically tired.  I think this I be from her underlying malignancy.  Overall, her performance status is ECOG 3..  Medications:    Medication List       This list is accurate as of: 11/23/14  5:45 PM.  Always use your  most recent med list.               carvedilol 6.25 MG tablet  Commonly known as:  COREG  Take 6.25 mg by mouth 2 (two) times daily with a meal.     everolimus 5 MG tablet  Commonly known as:  AFINITOR  Take 1 tablet (5 mg total) by mouth daily.     FLUoxetine 40 MG capsule  Commonly known as:  PROZAC  Take 40 mg by mouth daily before breakfast.     furosemide 20 MG tablet  Commonly known as:  LASIX  Take 20 mg by mouth. Take 2 tablets by mouth twice a day .     levothyroxine 300 MCG tablet  Commonly known as:  SYNTHROID, LEVOTHROID  TAKE 1 TABLET BY MOUTH DAILY BEFORE BREAKFAST     LORazepam 0.5 MG tablet  Commonly known as:  ATIVAN  PLACE 1 TABLET UNDER THE TONGUE EVERY 6 HOURS AS NEEDED FOR NAUSEA     nitroGLYCERIN 0.4 MG SL tablet  Commonly known as:  NITROSTAT  Place 1 tablet (0.4 mg total) under the tongue every 5 (five) minutes as needed for chest pain.     ondansetron 8 MG tablet  Commonly known as:  ZOFRAN  Take 1 tablet (8 mg total) by mouth every 8 (eight) hours as needed for nausea  or vomiting.     OxyCODONE 20 mg T12a 12 hr tablet  Commonly known as:  OXYCONTIN  Take 20 mg by mouth every 12 (twelve) hours.     oxycodone 5 MG capsule  Commonly known as:  OXY-IR  Take 5 mg by mouth every 4 (four) hours as needed. 7-25- "INCREASED BY PAIN DOCTOR TO 3 TABS Q 4 HR."     oxyCODONE-acetaminophen 10-325 MG per tablet  Commonly known as:  PERCOCET  Take 1 tablet by mouth every 4 (four) hours as needed (4 x per day). 7-25- "INCREASED BY PAIN DOCTOR"     potassium chloride SA 20 MEQ tablet  Commonly known as:  KLOR-CON M20  Take 2 tablets (40 mEq total) by mouth 2 (two) times daily.     pregabalin 200 MG capsule  Commonly known as:  LYRICA  Take 1 capsule (200 mg total) by mouth 2 (two) times daily.     promethazine 25 MG tablet  Commonly known as:  PHENERGAN  TAKE 1 TABLET BY MOUTH THREE TIMES DAILY AS NEEDED FOR NAUSEA     spironolactone 25 MG tablet    Commonly known as:  ALDACTONE  Take 1 tablet (25 mg total) by mouth daily.     temazepam 15 MG capsule  Commonly known as:  RESTORIL  TAKE 1 CAPSULE EVERY BEDTIME AS NEEDED FOR SLEEP--IF NEEDED CAN TAKE 1 ADDITIONAL CAPSULE     tiZANidine 4 MG tablet  Commonly known as:  ZANAFLEX  Take 4 mg by mouth every 6 (six) hours as needed for muscle spasms.        Allergies:  Allergies  Allergen Reactions  . Influenza Vac Split [Flu Virus Vaccine] Other (See Comments)    Joint stiffness and renal failure  . Ritalin [Methylphenidate Hcl]     "Heart attack"  . Zostavax [Zoster Vaccine Live ([employer])] Other (See Comments)    Joint stiffness, renal failure  . Adhesive [Tape] Itching and Rash    Past Medical History, Surgical history, Social history, and Family History were reviewed and updated.  Review of Systems: All other 10 point review of systems is negative.   Physical Exam:  height is 5\' 6"  (1.676 m) and weight is 233 lb (105.688 kg). Her oral temperature is 98.1 F (36.7 C). Her blood pressure is 93/49 and her pulse is 71. Her respiration is 14.   Wt Readings from Last 3 Encounters:  11/23/14 233 lb (105.688 kg)  10/05/14 265 lb (120.203 kg)  09/14/14 291 lb (131.997 kg)    Head and neck exam shows no ocular or oral lesions. She has no scleral icterus. She has no thrush. There is no adenopathy in the neck. Lungs are clear. Cardiac exam regular rate and rhythm with occasional extra beat. She has a 1/6 systolic ejection murmur. Abdomen is soft. She is obese. She has no fluid wave. There is no palpable liver or spleen tip. Back exam shows some tenderness throughout the thoracic and lumbar spine. She has some paravertebral spasms in the lumbar spine. Extremities shows minimal edema in her legs. She has arthritic issues in her joints. She has decreased range of motion of a lot of her joints. Skin exam shows no rashes, ecchymoses or petechia. Neurological exam shows no focal  neurological deficits.   Lab Results  Component Value Date   WBC 4.7 11/23/2014   HGB 9.4* 11/23/2014   HCT 29.0* 11/23/2014   MCV 96 11/23/2014   PLT 112* 11/23/2014   Lab  Results  Component Value Date   FERRITIN 1,641* 06/29/2014   IRON 42 11/23/2014   TIBC 204* 11/23/2014   UIBC 161 11/23/2014   IRONPCTSAT 21 11/23/2014   Lab Results  Component Value Date   RETICCTPCT 1.0 06/29/2014   RBC 3.02* 11/23/2014   RETICCTABS 32.3 06/29/2014   No results found for: KPAFRELGTCHN, LAMBDASER, KAPLAMBRATIO No results found for: Osborne Casco Lab Results  Component Value Date   TOTALPROTELP 4.7* 01/31/2010   ALBUMINELP 39.4* 01/31/2010   A1GS 12.1* 01/31/2010   A2GS 20.3* 01/31/2010   BETS 6.0 01/31/2010   BETA2SER 6.8* 01/31/2010   GAMS 15.4 01/31/2010   MSPIKE NOT DETECTED 01/31/2010   SPEI  01/31/2010    (NOTE) The possibility of a faint restricted band(s) cannot be completely excluded in the gamma region.  Suggest serum IFE to evaluate possibility, if clinically indicated. (Lab will hold sample one week. Please call Customer Service at [phone] to  add test.) Reviewed by Odis Hollingshead, MD, PhD, FCAP (Electronic Signature on File)     Chemistry      Component Value Date/Time   NA 142 11/23/2014 1403   NA 142 10/05/2014 1052   K 3.8 11/23/2014 1403   K 4.5 10/05/2014 1052   CL 112* 11/23/2014 1403   CL 109 10/05/2014 1052   CO2 18 11/23/2014 1403   CO2 24 10/05/2014 1052   BUN 34* 11/23/2014 1403   BUN 33* 10/05/2014 1052   CREATININE 1.7* 11/23/2014 1403   CREATININE 1.47* 10/05/2014 1052      Component Value Date/Time   CALCIUM 9.3 11/23/2014 1403   CALCIUM 8.2* 10/05/2014 1052   ALKPHOS 75 11/23/2014 1403   ALKPHOS 96 10/05/2014 1052   AST 27 11/23/2014 1403   AST 13 10/05/2014 1052   ALT 15 11/23/2014 1403   ALT 10 10/05/2014 1052   BILITOT 0.50 11/23/2014 1403   BILITOT 0.6 10/05/2014 1052     Impression and Plan: Impression and  Plan: Kerri Vasquez is 59 year old female with metastatic breast cancer with mets to the liver.   Hopefully, the Afinitor with the Faslodex will make a difference. I think is still a little too early for [place] to know this.  We really have to keep focusing on her quality of life. She is in a wheelchair. She really is not able to do much. She feels tired.  I'm sure that her iron studies probably are low.  She is quite anemic. I'm checking an erythropoietin level on her. She may qualify for Aranesp to try to help with her anemia.  I want to have her come back tomorrow so we can see about given her iron and vitamin B-12.  I don't think that we need any scans on her for several months. I want to give the Afinitor/Faslodex combination a try. I want to make sure that we give this an adequate trial.   I spent about 35 minutes with she and her daughter.  Volanda Napoleon, MD 7/25/20165:45 PM

## 2014-11-23 NOTE — Patient Instructions (Signed)
Cyanocobalamin, Vitamin B12 injection What is this medicine? CYANOCOBALAMIN (sye an oh koe BAL a min) is a man made form of vitamin B12. Vitamin B12 is used in the growth of healthy blood cells, nerve cells, and proteins in the body. It also helps with the metabolism of fats and carbohydrates. This medicine is used to treat people who can not absorb vitamin B12. This medicine may be used for other purposes; ask your health care provider or pharmacist if you have questions. COMMON BRAND NAME(S): Cyomin, LA-12, Nutri-Twelve, Primabalt What should I tell my health care provider before I take this medicine? They need to know if you have any of these conditions: -kidney disease -Leber's disease -megaloblastic anemia -an unusual or allergic reaction to cyanocobalamin, cobalt, other medicines, foods, dyes, or preservatives -pregnant or trying to get pregnant -breast-feeding How should I use this medicine? This medicine is injected into a muscle or deeply under the skin. It is usually given by a health care professional in a clinic or doctor's office. However, your doctor may teach you how to inject yourself. Follow all instructions. Talk to your pediatrician regarding the use of this medicine in children. Special care may be needed. Overdosage: If you think you have taken too much of this medicine contact a poison control center or emergency room at once. NOTE: This medicine is only for you. Do not share this medicine with others. What if I miss a dose? If you are given your dose at a clinic or doctor's office, call to reschedule your appointment. If you give your own injections and you miss a dose, take it as soon as you can. If it is almost time for your next dose, take only that dose. Do not take double or extra doses. What may interact with this medicine? -colchicine -heavy alcohol intake This list may not describe all possible interactions. Give your health care provider a list of all the  medicines, herbs, non-prescription drugs, or dietary supplements you use. Also tell them if you smoke, drink alcohol, or use illegal drugs. Some items may interact with your medicine. What should I watch for while using this medicine? Visit your doctor or health care professional regularly. You may need blood work done while you are taking this medicine. You may need to follow a special diet. Talk to your doctor. Limit your alcohol intake and avoid smoking to get the best benefit. What side effects may I notice from receiving this medicine? Side effects that you should report to your doctor or health care professional as soon as possible: -allergic reactions like skin rash, itching or hives, swelling of the face, lips, or tongue -blue tint to skin -chest tightness, pain -difficulty breathing, wheezing -dizziness -red, swollen painful area on the leg Side effects that usually do not require medical attention (report to your doctor or health care professional if they continue or are bothersome): -diarrhea -headache This list may not describe all possible side effects. Call your doctor for medical advice about side effects. You may report side effects to FDA at 1-800-FDA-1088. Where should I keep my medicine? Keep out of the reach of children. Store at room temperature between 15 and 30 degrees C (59 and 85 degrees F). Protect from light. Throw away any unused medicine after the expiration date. NOTE: This sheet is a summary. It may not cover all possible information. If you have questions about this medicine, talk to your doctor, pharmacist, or health care provider.  2015, Elsevier/Gold Standard. (2007-07-29 22:10:20) Fulvestrant  injection What is this medicine? FULVESTRANT (ful VES trant) blocks the effects of estrogen. It is used to treat breast cancer in women past the age of menopause. This medicine may be used for other purposes; ask your health care provider or pharmacist if you have  questions. COMMON BRAND NAME(S): FASLODEX What should I tell my health care provider before I take this medicine? They need to know if you have any of these conditions: -bleeding problems -liver disease -low levels of platelets in the blood -an unusual or allergic reaction to fulvestrant, other medicines, foods, dyes, or preservatives -pregnant or trying to get pregnant -breast-feeding How should I use this medicine? This medicine is for injection into a muscle. It is usually given by a health care professional in a hospital or clinic setting. Talk to your pediatrician regarding the use of this medicine in children. Special care may be needed. Overdosage: If you think you have taken too much of this medicine contact a poison control center or emergency room at once. NOTE: This medicine is only for you. Do not share this medicine with others. What if I miss a dose? It is important not to miss your dose. Call your doctor or health care professional if you are unable to keep an appointment. What may interact with this medicine? -medicines that treat or prevent blood clots like warfarin, enoxaparin, and dalteparin This list may not describe all possible interactions. Give your health care provider a list of all the medicines, herbs, non-prescription drugs, or dietary supplements you use. Also tell them if you smoke, drink alcohol, or use illegal drugs. Some items may interact with your medicine. What should I watch for while using this medicine? Your condition will be monitored carefully while you are receiving this medicine. You will need important blood work done while you are taking this medicine. Do not become pregnant while taking this medicine. Women should inform their doctor if they wish to become pregnant or think they might be pregnant. There is a potential for serious side effects to an unborn child. Talk to your health care professional or pharmacist for more information. What side  effects may I notice from receiving this medicine? Side effects that you should report to your doctor or health care professional as soon as possible: -allergic reactions like skin rash, itching or hives, swelling of the face, lips, or tongue -feeling faint or lightheaded, falls -fever or flu-like symptoms -sore throat -vaginal bleeding Side effects that usually do not require medical attention (report to your doctor or health care professional if they continue or are bothersome): -aches, pains -constipation or diarrhea -headache -hot flashes -nausea, vomiting -pain at site where injected -stomach pain This list may not describe all possible side effects. Call your doctor for medical advice about side effects. You may report side effects to FDA at 1-800-FDA-1088. Where should I keep my medicine? This drug is given in a hospital or clinic and will not be stored at home. NOTE: This sheet is a summary. It may not cover all possible information. If you have questions about this medicine, talk to your doctor, pharmacist, or health care provider.  2015, Elsevier/Gold Standard. (2007-08-26 15:39:24)

## 2014-11-24 ENCOUNTER — Encounter: Payer: Self-pay | Admitting: *Deleted

## 2014-11-24 ENCOUNTER — Ambulatory Visit (HOSPITAL_BASED_OUTPATIENT_CLINIC_OR_DEPARTMENT_OTHER): Payer: Medicare Other

## 2014-11-24 VITALS — BP 105/64 | HR 77 | Temp 98.2°F | Resp 18

## 2014-11-24 DIAGNOSIS — D649 Anemia, unspecified: Secondary | ICD-10-CM | POA: Diagnosis present

## 2014-11-24 DIAGNOSIS — D500 Iron deficiency anemia secondary to blood loss (chronic): Secondary | ICD-10-CM

## 2014-11-24 LAB — CANCER ANTIGEN 27.29: CA 27.29: 38 U/mL (ref 0–39)

## 2014-11-24 LAB — VITAMIN B12: Vitamin B-12: 601 pg/mL (ref 211–911)

## 2014-11-24 MED ORDER — FERUMOXYTOL INJECTION 510 MG/17 ML
510.0000 mg | Freq: Once | INTRAVENOUS | Status: AC
  Administered 2014-11-24: 510 mg via INTRAVENOUS
  Filled 2014-11-24: qty 17

## 2014-11-24 NOTE — Patient Instructions (Signed)

## 2014-11-25 ENCOUNTER — Encounter: Payer: Self-pay | Admitting: *Deleted

## 2014-11-26 LAB — ERYTHROPOIETIN

## 2014-11-30 ENCOUNTER — Inpatient Hospital Stay (HOSPITAL_COMMUNITY): Admission: RE | Admit: 2014-11-30 | Payer: Self-pay | Source: Ambulatory Visit

## 2014-12-07 DIAGNOSIS — G4700 Insomnia, unspecified: Secondary | ICD-10-CM | POA: Diagnosis not present

## 2014-12-07 DIAGNOSIS — L8990 Pressure ulcer of unspecified site, unspecified stage: Secondary | ICD-10-CM | POA: Diagnosis not present

## 2014-12-07 DIAGNOSIS — C50919 Malignant neoplasm of unspecified site of unspecified female breast: Secondary | ICD-10-CM | POA: Insufficient documentation

## 2014-12-07 DIAGNOSIS — E039 Hypothyroidism, unspecified: Secondary | ICD-10-CM | POA: Diagnosis not present

## 2014-12-09 ENCOUNTER — Other Ambulatory Visit (HOSPITAL_COMMUNITY): Payer: Self-pay | Admitting: Internal Medicine

## 2014-12-15 ENCOUNTER — Other Ambulatory Visit: Payer: Self-pay | Admitting: Hematology & Oncology

## 2014-12-24 ENCOUNTER — Ambulatory Visit (HOSPITAL_BASED_OUTPATIENT_CLINIC_OR_DEPARTMENT_OTHER): Payer: Medicare Other | Admitting: Hematology & Oncology

## 2014-12-24 ENCOUNTER — Ambulatory Visit (HOSPITAL_BASED_OUTPATIENT_CLINIC_OR_DEPARTMENT_OTHER): Payer: Medicare Other

## 2014-12-24 ENCOUNTER — Encounter: Payer: Self-pay | Admitting: Hematology & Oncology

## 2014-12-24 ENCOUNTER — Other Ambulatory Visit: Payer: Self-pay | Admitting: *Deleted

## 2014-12-24 ENCOUNTER — Other Ambulatory Visit (HOSPITAL_BASED_OUTPATIENT_CLINIC_OR_DEPARTMENT_OTHER): Payer: Medicare Other

## 2014-12-24 VITALS — BP 97/59 | HR 80 | Temp 97.6°F

## 2014-12-24 DIAGNOSIS — C50911 Malignant neoplasm of unspecified site of right female breast: Secondary | ICD-10-CM

## 2014-12-24 DIAGNOSIS — D510 Vitamin B12 deficiency anemia due to intrinsic factor deficiency: Secondary | ICD-10-CM | POA: Diagnosis not present

## 2014-12-24 DIAGNOSIS — E538 Deficiency of other specified B group vitamins: Secondary | ICD-10-CM

## 2014-12-24 DIAGNOSIS — C787 Secondary malignant neoplasm of liver and intrahepatic bile duct: Secondary | ICD-10-CM

## 2014-12-24 DIAGNOSIS — Z5111 Encounter for antineoplastic chemotherapy: Secondary | ICD-10-CM | POA: Diagnosis present

## 2014-12-24 DIAGNOSIS — R7989 Other specified abnormal findings of blood chemistry: Secondary | ICD-10-CM

## 2014-12-24 DIAGNOSIS — C50919 Malignant neoplasm of unspecified site of unspecified female breast: Secondary | ICD-10-CM

## 2014-12-24 DIAGNOSIS — C50912 Malignant neoplasm of unspecified site of left female breast: Secondary | ICD-10-CM

## 2014-12-24 DIAGNOSIS — R519 Headache, unspecified: Secondary | ICD-10-CM

## 2014-12-24 DIAGNOSIS — R51 Headache: Principal | ICD-10-CM

## 2014-12-24 LAB — IRON AND TIBC CHCC
%SAT: 46 % (ref 21–57)
IRON: 71 ug/dL (ref 41–142)
TIBC: 154 ug/dL — ABNORMAL LOW (ref 236–444)
UIBC: 82 ug/dL — ABNORMAL LOW (ref 120–384)

## 2014-12-24 LAB — CMP (CANCER CENTER ONLY)
ALBUMIN: 3.2 g/dL — AB (ref 3.3–5.5)
ALK PHOS: 136 U/L — AB (ref 26–84)
ALT: 99 U/L — AB (ref 10–47)
AST: 266 U/L — AB (ref 11–38)
BILIRUBIN TOTAL: 0.6 mg/dL (ref 0.20–1.60)
BUN, Bld: 26 mg/dL — ABNORMAL HIGH (ref 7–22)
CALCIUM: 8.9 mg/dL (ref 8.0–10.3)
CO2: 27 meq/L (ref 18–33)
Chloride: 105 mEq/L (ref 98–108)
Creat: 1.5 mg/dl — ABNORMAL HIGH (ref 0.6–1.2)
Glucose, Bld: 140 mg/dL — ABNORMAL HIGH (ref 73–118)
Potassium: 4.4 mEq/L (ref 3.3–4.7)
Sodium: 141 mEq/L (ref 128–145)
TOTAL PROTEIN: 6.9 g/dL (ref 6.4–8.1)

## 2014-12-24 LAB — CBC WITH DIFFERENTIAL (CANCER CENTER ONLY)
BASO#: 0 10*3/uL (ref 0.0–0.2)
BASO%: 0.4 % (ref 0.0–2.0)
EOS ABS: 0.3 10*3/uL (ref 0.0–0.5)
EOS%: 5.8 % (ref 0.0–7.0)
HCT: 32.7 % — ABNORMAL LOW (ref 34.8–46.6)
HGB: 10.2 g/dL — ABNORMAL LOW (ref 11.6–15.9)
LYMPH#: 1 10*3/uL (ref 0.9–3.3)
LYMPH%: 20.8 % (ref 14.0–48.0)
MCH: 29.7 pg (ref 26.0–34.0)
MCHC: 31.2 g/dL — ABNORMAL LOW (ref 32.0–36.0)
MCV: 95 fL (ref 81–101)
MONO#: 0.5 10*3/uL (ref 0.1–0.9)
MONO%: 10.1 % (ref 0.0–13.0)
NEUT#: 3.1 10*3/uL (ref 1.5–6.5)
NEUT%: 62.9 % (ref 39.6–80.0)
Platelets: 131 10*3/uL — ABNORMAL LOW (ref 145–400)
RBC: 3.43 10*6/uL — AB (ref 3.70–5.32)
RDW: 12.9 % (ref 11.1–15.7)
WBC: 4.9 10*3/uL (ref 3.9–10.0)

## 2014-12-24 LAB — VITAMIN B12: Vitamin B-12: 1071 pg/mL — ABNORMAL HIGH (ref 211–911)

## 2014-12-24 LAB — CHCC SATELLITE - SMEAR

## 2014-12-24 LAB — CANCER ANTIGEN 27.29: CA 27.29: 44 U/mL — ABNORMAL HIGH (ref 0–39)

## 2014-12-24 MED ORDER — CYANOCOBALAMIN 1000 MCG/ML IJ SOLN
INTRAMUSCULAR | Status: AC
  Filled 2014-12-24: qty 1

## 2014-12-24 MED ORDER — CYANOCOBALAMIN 1000 MCG/ML IJ SOLN
1000.0000 ug | Freq: Once | INTRAMUSCULAR | Status: AC
Start: 2014-12-24 — End: 2014-12-24
  Administered 2014-12-24: 1000 ug via INTRAMUSCULAR

## 2014-12-24 MED ORDER — FULVESTRANT 250 MG/5ML IM SOLN
500.0000 mg | Freq: Once | INTRAMUSCULAR | Status: AC
  Administered 2014-12-24: 500 mg via INTRAMUSCULAR

## 2014-12-24 MED ORDER — FULVESTRANT 250 MG/5ML IM SOLN
INTRAMUSCULAR | Status: AC
  Filled 2014-12-24: qty 5

## 2014-12-24 NOTE — Progress Notes (Signed)
Hematology and Oncology Follow Up Visit  Kerri Vasquez [id number] [birth year] 59 y.o. 12/24/2014   Principle Diagnosis:  1. Metastatic breast cancer, progressive liver metastases  2. Gastric bypass for subsequent B12 and iron deficiency 3. Osteoarthritis with chronic pain 4. 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 Faslodex 500 mg IM monthly 2. Afinitor 5 mg by mouth daily 2. Vitamin B12 1 mg IM monthly 3. IV iron as indicated    Interim History:  Kerri Vasquez is here today with her daughter for a follow-up. She looks much better. She is walking in. She has a cane.  She is not complaining of any type of bleeding. She's had no cough. She's had no leg swelling. She has lost some weight. ble of any significant employment.  She is chronically tired.  I think this might be from her underlying malignancy. She does have a lot of other health issues.  Thankfully, her cardiac status seems to be improving.  She has lost weight. Her weight today was 212 pounds. However, in the past 4 days, however, her appetite has come back. She has better taste. She feels like eating.  She did get iron last time she was here.  Overall, her performance status is ECOG 3..  Medications:    Medication List       This list is accurate as of: 12/24/14  1:40 PM.  Always use your most recent med list.               carvedilol 6.25 MG tablet  Commonly known as:  COREG  Take 6.25 mg by mouth 2 (two) times daily with a meal.     everolimus 5 MG tablet  Commonly known as:  AFINITOR  Take 1 tablet (5 mg total) by mouth daily.     FLUoxetine 40 MG capsule  Commonly known as:  PROZAC  Take 40 mg by mouth daily before breakfast.     furosemide 20 MG tablet  Commonly known as:  LASIX  Take 20 mg by mouth. Take 2 tablets by mouth twice a day .     levothyroxine 300 MCG tablet  Commonly known as:  SYNTHROID, LEVOTHROID  TAKE 1 TABLET BY MOUTH DAILY BEFORE BREAKFAST     LORazepam 0.5 MG tablet  Commonly known as:  ATIVAN  PLACE 1 TABLET BY MOUTH UNDER THE TONGUE EVERY 6 HOURS AS NEEDED FOR NAUSEA     nitroGLYCERIN 0.4 MG SL tablet  Commonly known as:  NITROSTAT  Place 1 tablet (0.4 mg total) under the tongue every 5 (five) minutes as needed for chest pain.     ondansetron 8 MG tablet  Commonly known as:  ZOFRAN  Take 1 tablet (8 mg total) by mouth every 8 (eight) hours as needed for nausea or vomiting.     OxyCODONE 20 mg T12a 12 hr tablet  Commonly known as:  OXYCONTIN  Take 20 mg by mouth every 12 (twelve) hours.     oxycodone 5 MG capsule  Commonly known as:  OXY-IR  Take 5 mg by mouth every 4 (four) hours as needed. 7-25- "INCREASED BY PAIN DOCTOR TO 3 TABS Q 4 HR."     oxyCODONE-acetaminophen 10-325 MG per tablet  Commonly known as:  PERCOCET  Take 1 tablet by mouth every 4 (four) hours as needed (4 x per day). 7-25- "INCREASED BY PAIN DOCTOR"     potassium chloride SA 20 MEQ tablet  Commonly known as:  KLOR-CON M20  Take 2 tablets (40 mEq total) by mouth 2 (two) times daily.     pregabalin 200 MG capsule  Commonly known as:  LYRICA  Take 1 capsule (200 mg total) by mouth 2 (two) times daily.     promethazine 25 MG tablet  Commonly known as:  PHENERGAN  TAKE 1 TABLET BY MOUTH THREE TIMES DAILY AS NEEDED FOR NAUSEA     spironolactone 25 MG tablet  Commonly known as:  ALDACTONE  TAKE 1 TABLET BY MOUTH DAILY     temazepam 15 MG capsule  Commonly known as:  RESTORIL  TAKE 1 CAPSULE EVERY BEDTIME AS NEEDED FOR SLEEP--IF NEEDED CAN TAKE 1 ADDITIONAL CAPSULE     tiZANidine 4 MG tablet  Commonly known as:  ZANAFLEX  Take 4 mg by mouth every 6 (six) hours as needed for muscle spasms.        Allergies:  Allergies  Allergen Reactions  . Influenza Vac Split [Flu Virus Vaccine] Other (See Comments)    Joint stiffness and renal failure  . Ritalin [Methylphenidate Hcl]     "Heart attack"  . Zostavax [Zoster Vaccine Live ([employer])]  Other (See Comments)    Joint stiffness, renal failure  . Adhesive [Tape] Itching and Rash    Past Medical History, Surgical history, Social history, and Family History were reviewed and updated.  Review of Systems: All other 10 point review of systems is negative.   Physical Exam:  oral temperature is 97.6 F (36.4 C). Her blood pressure is 97/59 and her pulse is 80.   Wt Readings from Last 3 Encounters:  11/23/14 233 lb (105.688 kg)  10/05/14 265 lb (120.203 kg)  09/14/14 291 lb (131.997 kg)    Head and neck exam shows no ocular or oral lesions. She has no scleral icterus. She has no thrush. There is no adenopathy in the neck. Lungs are clear. Cardiac exam regular rate and rhythm with occasional extra beat. She has a 1/6 systolic ejection murmur. Abdomen is soft. She is obese. She has no fluid wave. There is no palpable liver or spleen tip. Back exam shows some tenderness throughout the thoracic and lumbar spine. She has some paravertebral spasms in the lumbar spine. Extremities shows minimal edema in her legs. She has arthritic issues in her joints. She has decreased range of motion of a lot of her joints. Skin exam shows no rashes, ecchymoses or petechia. Neurological exam shows no focal neurological deficits.   Lab Results  Component Value Date   WBC 4.9 12/24/2014   HGB 10.2* 12/24/2014   HCT 32.7* 12/24/2014   MCV 95 12/24/2014   PLT 131* 12/24/2014   Lab Results  Component Value Date   FERRITIN 1,641* 06/29/2014   IRON 42 11/23/2014   TIBC 204* 11/23/2014   UIBC 161 11/23/2014   IRONPCTSAT 21 11/23/2014   Lab Results  Component Value Date   RETICCTPCT 1.0 06/29/2014   RBC 3.43* 12/24/2014   RETICCTABS 32.3 06/29/2014   No results found for: KPAFRELGTCHN, LAMBDASER, KAPLAMBRATIO No results found for: Kandis Cocking, IGMSERUM Lab Results  Component Value Date   TOTALPROTELP 4.7* 01/31/2010   ALBUMINELP 39.4* 01/31/2010   A1GS 12.1* 01/31/2010   A2GS 20.3*  01/31/2010   BETS 6.0 01/31/2010   BETA2SER 6.8* 01/31/2010   GAMS 15.4 01/31/2010   MSPIKE NOT DETECTED 01/31/2010   SPEI  01/31/2010    (NOTE) The possibility of a faint restricted band(s) cannot be completely excluded in the gamma  region.  Suggest serum IFE to evaluate possibility, if clinically indicated. (Lab will hold sample one week. Please call Customer Service at [phone] to  add test.) Reviewed by Odis Hollingshead, MD, PhD, FCAP (Electronic Signature on File)     Chemistry      Component Value Date/Time   NA 141 12/24/2014 1207   NA 142 10/05/2014 1052   K 4.4 12/24/2014 1207   K 4.5 10/05/2014 1052   CL 105 12/24/2014 1207   CL 109 10/05/2014 1052   CO2 27 12/24/2014 1207   CO2 24 10/05/2014 1052   BUN 26* 12/24/2014 1207   BUN 33* 10/05/2014 1052   CREATININE 1.5* 12/24/2014 1207   CREATININE 1.47* 10/05/2014 1052      Component Value Date/Time   CALCIUM 8.9 12/24/2014 1207   CALCIUM 8.2* 10/05/2014 1052   ALKPHOS 136* 12/24/2014 1207   ALKPHOS 96 10/05/2014 1052   AST 266* 12/24/2014 1207   AST 13 10/05/2014 1052   ALT 99* 12/24/2014 1207   ALT 10 10/05/2014 1052   BILITOT 0.60 12/24/2014 1207   BILITOT 0.6 10/05/2014 1052     Impression and Plan: Impression and Plan: Kerri Vasquez is 59 year old female with metastatic breast cancer with mets to the liver.   I am not sure as to why her liver tests are elevated. She does not drink. She has had no change in her medications. I know that Afinitor can cause some elevated liver tests.  I want her to stop the Afinitor. I want her off this. In 1 week, we will repeat her liver function tests. If the liver tests are still elevated, that we are going to have to get a CT scan or possibly MRI of the liver.  Her CA 27.29 has been holding pretty steady so I would not think that this would be progressive metastatic disease but that is always a possibility that has to be addressed.  She did get her vitamin B-12 today.     I spent about 35 minutes with she and her daughter.  Volanda Napoleon, MD 8/25/20161:40 PM

## 2014-12-24 NOTE — Patient Instructions (Signed)
Fulvestrant injection What is this medicine? FULVESTRANT (ful VES trant) blocks the effects of estrogen. It is used to treat breast cancer in women past the age of menopause. This medicine may be used for other purposes; ask your health care provider or pharmacist if you have questions. COMMON BRAND NAME(S): FASLODEX What should I tell my health care provider before I take this medicine? They need to know if you have any of these conditions: -bleeding problems -liver disease -low levels of platelets in the blood -an unusual or allergic reaction to fulvestrant, other medicines, foods, dyes, or preservatives -pregnant or trying to get pregnant -breast-feeding How should I use this medicine? This medicine is for injection into a muscle. It is usually given by a health care professional in a hospital or clinic setting. Talk to your pediatrician regarding the use of this medicine in children. Special care may be needed. Overdosage: If you think you have taken too much of this medicine contact a poison control center or emergency room at once. NOTE: This medicine is only for you. Do not share this medicine with others. What if I miss a dose? It is important not to miss your dose. Call your doctor or health care professional if you are unable to keep an appointment. What may interact with this medicine? -medicines that treat or prevent blood clots like warfarin, enoxaparin, and dalteparin This list may not describe all possible interactions. Give your health care provider a list of all the medicines, herbs, non-prescription drugs, or dietary supplements you use. Also tell them if you smoke, drink alcohol, or use illegal drugs. Some items may interact with your medicine. What should I watch for while using this medicine? Your condition will be monitored carefully while you are receiving this medicine. You will need important blood work done while you are taking this medicine. Do not become pregnant  while taking this medicine. Women should inform their doctor if they wish to become pregnant or think they might be pregnant. There is a potential for serious side effects to an unborn child. Talk to your health care professional or pharmacist for more information. What side effects may I notice from receiving this medicine? Side effects that you should report to your doctor or health care professional as soon as possible: -allergic reactions like skin rash, itching or hives, swelling of the face, lips, or tongue -feeling faint or lightheaded, falls -fever or flu-like symptoms -sore throat -vaginal bleeding Side effects that usually do not require medical attention (report to your doctor or health care professional if they continue or are bothersome): -aches, pains -constipation or diarrhea -headache -hot flashes -nausea, vomiting -pain at site where injected -stomach pain This list may not describe all possible side effects. Call your doctor for medical advice about side effects. You may report side effects to FDA at 1-800-FDA-1088. Where should I keep my medicine? This drug is given in a hospital or clinic and will not be stored at home. NOTE: This sheet is a summary. It may not cover all possible information. If you have questions about this medicine, talk to your doctor, pharmacist, or health care provider.  2015, Elsevier/Gold Standard. (2007-08-26 15:39:24) Cyanocobalamin, Vitamin B12 injection What is this medicine? CYANOCOBALAMIN (sye an oh koe BAL a min) is a man made form of vitamin B12. Vitamin B12 is used in the growth of healthy blood cells, nerve cells, and proteins in the body. It also helps with the metabolism of fats and carbohydrates. This medicine is used  to treat people who can not absorb vitamin B12. This medicine may be used for other purposes; ask your health care provider or pharmacist if you have questions. COMMON BRAND NAME(S): Cyomin, LA-12, Nutri-Twelve,  Primabalt What should I tell my health care provider before I take this medicine? They need to know if you have any of these conditions: -kidney disease -Leber's disease -megaloblastic anemia -an unusual or allergic reaction to cyanocobalamin, cobalt, other medicines, foods, dyes, or preservatives -pregnant or trying to get pregnant -breast-feeding How should I use this medicine? This medicine is injected into a muscle or deeply under the skin. It is usually given by a health care professional in a clinic or doctor's office. However, your doctor may teach you how to inject yourself. Follow all instructions. Talk to your pediatrician regarding the use of this medicine in children. Special care may be needed. Overdosage: If you think you have taken too much of this medicine contact a poison control center or emergency room at once. NOTE: This medicine is only for you. Do not share this medicine with others. What if I miss a dose? If you are given your dose at a clinic or doctor's office, call to reschedule your appointment. If you give your own injections and you miss a dose, take it as soon as you can. If it is almost time for your next dose, take only that dose. Do not take double or extra doses. What may interact with this medicine? -colchicine -heavy alcohol intake This list may not describe all possible interactions. Give your health care provider a list of all the medicines, herbs, non-prescription drugs, or dietary supplements you use. Also tell them if you smoke, drink alcohol, or use illegal drugs. Some items may interact with your medicine. What should I watch for while using this medicine? Visit your doctor or health care professional regularly. You may need blood work done while you are taking this medicine. You may need to follow a special diet. Talk to your doctor. Limit your alcohol intake and avoid smoking to get the best benefit. What side effects may I notice from receiving  this medicine? Side effects that you should report to your doctor or health care professional as soon as possible: -allergic reactions like skin rash, itching or hives, swelling of the face, lips, or tongue -blue tint to skin -chest tightness, pain -difficulty breathing, wheezing -dizziness -red, swollen painful area on the leg Side effects that usually do not require medical attention (report to your doctor or health care professional if they continue or are bothersome): -diarrhea -headache This list may not describe all possible side effects. Call your doctor for medical advice about side effects. You may report side effects to FDA at 1-800-FDA-1088. Where should I keep my medicine? Keep out of the reach of children. Store at room temperature between 15 and 30 degrees C (59 and 85 degrees F). Protect from light. Throw away any unused medicine after the expiration date. NOTE: This sheet is a summary. It may not cover all possible information. If you have questions about this medicine, talk to your doctor, pharmacist, or health care provider.  2015, Elsevier/Gold Standard. (2007-07-29 22:10:20)

## 2014-12-31 ENCOUNTER — Other Ambulatory Visit (HOSPITAL_BASED_OUTPATIENT_CLINIC_OR_DEPARTMENT_OTHER): Payer: Medicare Other

## 2014-12-31 ENCOUNTER — Inpatient Hospital Stay (HOSPITAL_COMMUNITY): Admission: RE | Admit: 2014-12-31 | Payer: Self-pay | Source: Ambulatory Visit

## 2014-12-31 DIAGNOSIS — D510 Vitamin B12 deficiency anemia due to intrinsic factor deficiency: Secondary | ICD-10-CM

## 2014-12-31 DIAGNOSIS — C50911 Malignant neoplasm of unspecified site of right female breast: Secondary | ICD-10-CM

## 2014-12-31 DIAGNOSIS — C787 Secondary malignant neoplasm of liver and intrahepatic bile duct: Secondary | ICD-10-CM | POA: Diagnosis not present

## 2014-12-31 LAB — CMP (CANCER CENTER ONLY)
ALK PHOS: 92 U/L — AB (ref 26–84)
ALT: 36 U/L (ref 10–47)
AST: 32 U/L (ref 11–38)
Albumin: 3.4 g/dL (ref 3.3–5.5)
BILIRUBIN TOTAL: 0.6 mg/dL (ref 0.20–1.60)
BUN: 26 mg/dL — AB (ref 7–22)
CO2: 27 meq/L (ref 18–33)
CREATININE: 1.1 mg/dL (ref 0.6–1.2)
Calcium: 8.5 mg/dL (ref 8.0–10.3)
Chloride: 102 mEq/L (ref 98–108)
GLUCOSE: 89 mg/dL (ref 73–118)
Potassium: 4 mEq/L (ref 3.3–4.7)
SODIUM: 142 meq/L (ref 128–145)
Total Protein: 7.1 g/dL (ref 6.4–8.1)

## 2015-01-02 ENCOUNTER — Ambulatory Visit (HOSPITAL_BASED_OUTPATIENT_CLINIC_OR_DEPARTMENT_OTHER): Payer: Medicare Other

## 2015-01-12 ENCOUNTER — Other Ambulatory Visit: Payer: Self-pay | Admitting: Cardiology

## 2015-01-16 ENCOUNTER — Ambulatory Visit (HOSPITAL_BASED_OUTPATIENT_CLINIC_OR_DEPARTMENT_OTHER): Payer: Medicare Other

## 2015-01-21 ENCOUNTER — Ambulatory Visit (HOSPITAL_COMMUNITY)
Admission: RE | Admit: 2015-01-21 | Discharge: 2015-01-21 | Disposition: A | Discharge status: Home / Self Care | Payer: Medicare Other | Source: Ambulatory Visit | Attending: Internal Medicine | Admitting: Internal Medicine

## 2015-01-21 ENCOUNTER — Encounter (HOSPITAL_BASED_OUTPATIENT_CLINIC_OR_DEPARTMENT_OTHER): Payer: Self-pay

## 2015-01-21 VITALS — BP 112/62 | HR 68 | Wt 226.0 lb

## 2015-01-21 DIAGNOSIS — Z9884 Bariatric surgery status: Secondary | ICD-10-CM | POA: Diagnosis not present

## 2015-01-21 DIAGNOSIS — I10 Essential (primary) hypertension: Secondary | ICD-10-CM | POA: Diagnosis not present

## 2015-01-21 DIAGNOSIS — G8929 Other chronic pain: Secondary | ICD-10-CM | POA: Insufficient documentation

## 2015-01-21 DIAGNOSIS — I5181 Takotsubo syndrome: Secondary | ICD-10-CM | POA: Diagnosis not present

## 2015-01-21 DIAGNOSIS — C50919 Malignant neoplasm of unspecified site of unspecified female breast: Secondary | ICD-10-CM

## 2015-01-21 DIAGNOSIS — M545 Low back pain: Secondary | ICD-10-CM | POA: Insufficient documentation

## 2015-01-21 DIAGNOSIS — E119 Type 2 diabetes mellitus without complications: Secondary | ICD-10-CM | POA: Diagnosis not present

## 2015-01-21 DIAGNOSIS — C787 Secondary malignant neoplasm of liver and intrahepatic bile duct: Secondary | ICD-10-CM | POA: Diagnosis not present

## 2015-01-21 DIAGNOSIS — K219 Gastro-esophageal reflux disease without esophagitis: Secondary | ICD-10-CM | POA: Insufficient documentation

## 2015-01-21 DIAGNOSIS — Z85850 Personal history of malignant neoplasm of thyroid: Secondary | ICD-10-CM | POA: Insufficient documentation

## 2015-01-21 DIAGNOSIS — M797 Fibromyalgia: Secondary | ICD-10-CM | POA: Insufficient documentation

## 2015-01-21 DIAGNOSIS — F329 Major depressive disorder, single episode, unspecified: Secondary | ICD-10-CM | POA: Diagnosis not present

## 2015-01-21 DIAGNOSIS — I5032 Chronic diastolic (congestive) heart failure: Secondary | ICD-10-CM

## 2015-01-21 DIAGNOSIS — I252 Old myocardial infarction: Secondary | ICD-10-CM | POA: Insufficient documentation

## 2015-01-21 DIAGNOSIS — Z79899 Other long term (current) drug therapy: Secondary | ICD-10-CM | POA: Diagnosis not present

## 2015-01-21 DIAGNOSIS — Z8249 Family history of ischemic heart disease and other diseases of the circulatory system: Secondary | ICD-10-CM | POA: Insufficient documentation

## 2015-01-21 DIAGNOSIS — E039 Hypothyroidism, unspecified: Secondary | ICD-10-CM | POA: Insufficient documentation

## 2015-01-21 NOTE — Progress Notes (Signed)
Patient ID: Kerri Vasquez, female   DOB: 21-Sep-1955, 59 y.o.   MRN: [id number] Primary Cardiologist: Dr Haroldine Laws Oncologist: Dr Marin Olp  HPI: Kerri Vasquez is 59 y/o woman with obesity s/p gastric bypass, HTN and breast CA. Underwent chemo and bilateral mastectomies in 2008. It sounds like her chemotherapy was Adriamycin/Cytoxan followed by Taxotere. She was then put on tamoxifen for year and then Femara. She had a previous liver lesion that was ablated.   Unfortunately found to have recurrent liver metastasis and underwent IR ablation in 3/16. Procedure complicated by acute CP and dyspnea with hypotension. Trop peaked at 17. ECHO with EF 20-25% with RWMA. . Cath on 07/14/14 with no significant CAD. LVEDP 10. Echo in 2011 showed normal EF. Prior to discharge struggled with hypotension and required pressors. Could only tolerate low-dose HF meds.  Suspected Takotsubo cardiomyopathy.   She is now getting cancer treatment with palbociclib and Faslodex, neither of which commonly cause cardiac toxicity.   She was readmitted in 4/16 with AKI, creatinine up to 1.73.  Prior to this, she had increased Lasix to 80 mg daily with weight gain and edema.  She was given IV fluid in the hospital with fall in creatinine.  Echo in 4/16 showed improvement in EF to 55-60%, the RV was poorly seen. Lasix was stopped in the hospital.   She returns for HF follow up. Following closely with  Dr Marin Olp for metastatic breast cancer. Overall feeling ok. Denies SOB/PND/Orthopnea. Not weighing daily. Leg fatigue. Taking all medications.    Labs (4/16): creatinine 1.7 Labs (5/16): K 4 => 4.6, creatinine 1.3 => 1.27, LFTs normal, HCT 34.5  ECHO 06/2014: EF 20-25%  ECHO 4/16: EF 55-60%, RV poorly visualized  ROS: All systems negative except as listed in HPI, PMH and Problem List.  SH:  Social History   Social History  . Marital Status: Divorced    Spouse Name: N/A  . Number of Children: 1  . Years of Education: N/A    Occupational History  . Not on file.   Social History Main Topics  . Smoking status: Never Smoker   . Smokeless tobacco: Never Used     Comment: NEVER USED TOBACCO  . Alcohol Use: No  . Drug Use: No  . Sexual Activity: Not Currently   Other Topics Concern  . Not on file   Social History Narrative   Caffeine use: 3 cups coffee/tea daily   Regular exercise: no          FH:  Family History  Problem Relation Age of Onset  . Cancer Mother     breast  . Hypertension Mother   . Anesthesia problems Daughter     Past Medical History  Diagnosis Date  . Depression   . Hypertension   . Hypothyroidism   . Incisional hernia   . Spinal stenosis   . Scoliosis   . Sciatica   . Intestinal obstruction   . GERD (gastroesophageal reflux disease)   . Septic arthritis 01/28/10    and spinal stenosis , moderate scoliosis   . Osteoarthritis of multiple joints   . Sleep apnea     hx of off machine x 4 years   . Renal insufficiency     hx renal failure 2011  . Fibromyalgia   . Fracture of multiple toes     right toes x 4 toes- excluding small toe  . Diabetes mellitus     type II, controlled by diet 05/25/11   .  CHF (congestive heart failure)   . Anemia   . Pernicious anemia 05/30/2011    Iron transfusion and VB12 on 06/06/11  . Migraine     "weekly; but they don't last long" (08/26/2014)  . Chronic lower back pain   . Breast cancer, right breast   . Liver cancer     "twice"  . Thyroid cancer   . Myocardial infarction 06/2014    S/P percutaneous intervention procedure a metastatic lesion in the liver    Current Outpatient Prescriptions  Medication Sig Dispense Refill  . carvedilol (COREG) 6.25 MG tablet Take 6.25 mg by mouth 2 (two) times daily with a meal.    . everolimus (AFINITOR) 5 MG tablet Take 1 tablet (5 mg total) by mouth daily. 30 tablet 4  . FLUoxetine (PROZAC) 40 MG capsule Take 40 mg by mouth daily before breakfast.     . fulvestrant (FASLODEX) 250 MG/5ML  injection Inject 250 mg into the muscle every 30 (thirty) days.    . furosemide (LASIX) 20 MG tablet Take 20 mg by mouth. Take 2 tablets by mouth twice a day .    . levothyroxine (SYNTHROID, LEVOTHROID) 200 MCG tablet Take 200 mcg by mouth daily.    Marland Kitchen LORazepam (ATIVAN) 0.5 MG tablet PLACE 1 TABLET BY MOUTH UNDER THE TONGUE EVERY 6 HOURS AS NEEDED FOR NAUSEA 60 tablet 0  . methocarbamol (ROBAXIN) 750 MG tablet Take 750 mg by mouth 4 (four) times daily.    . Multiple Vitamin (MULTI-VITAMINS) TABS Take 1 tablet by mouth daily.    . ondansetron (ZOFRAN) 8 MG tablet Take 1 tablet (8 mg total) by mouth every 8 (eight) hours as needed for nausea or vomiting. 60 tablet 2  . oxycodone (OXY-IR) 5 MG capsule Take 5 mg by mouth every 4 (four) hours as needed. 7-25- "INCREASED BY PAIN DOCTOR TO 3 TABS Q 4 HR."    . oxyCODONE-acetaminophen (PERCOCET) 10-325 MG per tablet Take 1 tablet by mouth every 5 (five) hours as needed (4 x per day). 7-25- "INCREASED BY PAIN DOCTOR"    . oxymorphone (OPANA ER) 15 MG 12 hr tablet Take 15 mg by mouth every 12 (twelve) hours.    . potassium chloride SA (KLOR-CON M20) 20 MEQ tablet Take 2 tablets (40 mEq total) by mouth 2 (two) times daily. 120 tablet 3  . pregabalin (LYRICA) 200 MG capsule Take 1 capsule (200 mg total) by mouth 2 (two) times daily. (Patient taking differently: Take 200 mg by mouth 3 (three) times daily. 7-25- INCREASED) 60 capsule 1  . promethazine (PHENERGAN) 25 MG tablet TAKE 1 TABLET BY MOUTH THREE TIMES DAILY AS NEEDED FOR NAUSEA 90 tablet 0  . spironolactone (ALDACTONE) 25 MG tablet TAKE 1 TABLET BY MOUTH DAILY 30 tablet 0  . temazepam (RESTORIL) 15 MG capsule Take 30 mg by mouth at bedtime.    Marland Kitchen tiZANidine (ZANAFLEX) 4 MG tablet Take 4 mg by mouth every 6 (six) hours as needed for muscle spasms.    . nitroGLYCERIN (NITROSTAT) 0.4 MG SL tablet Place 1 tablet (0.4 mg total) under the tongue every 5 (five) minutes as needed for chest pain. (Patient not  taking: Reported on 01/21/2015) 30 tablet 12   No current facility-administered medications for this encounter.    Filed Vitals:   01/21/15 1225  BP: 112/62  Pulse: 68  Weight: 226 lb (102.513 kg)  SpO2: 99%    PHYSICAL EXAM:  General:  Obese woman sitting in chair. No resp  difficulty HEENT: normal Neck: supple. JVP 8-9 cm. Carotids 2+ bilaterally; no bruits. No lymphadenopathy or thryomegaly appreciated. Cor: PMI normal. Regular rate & rhythm. No rubs, gallops or murmurs. Lungs: clear Abdomen: soft, obese nontender, nondistended. No hepatosplenomegaly. No bruits or masses. Good bowel sounds. Extremities: no cyanosis, clubbing, rash, 1+ edema to knees bilaterally. Neuro: alert & orientedx3, cranial nerves grossly intact. Moves all 4 extremities w/o difficulty. Affect pleasant  ASSESSMENT & PLAN: 1. 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 Echo 06/2014 EF 20-25%. Coronary angiography with no CAD.  4/16 echo showed EF improved to 55-60%.    - Would continue Coreg 3.125 mg bid.  2. Chronic diastolic CHF: Patient has developed volume overload despite improvement in EF.   RV was poorly visualized by echo, so I wonder if she does not have prominent RV failure (?OHS/OSA, ?chronic PE with pulmonary hypertension) NYHA class II-IIIb symptoms still.  - Continue Lasix to 60 mg bid, continue current KCl.    -  - Will need sleep study. - Will likely need RHC at some point, can consider this at next appointment if creatinine rising or has trouble diuresing.  - Wear compression stockings during the day.  2. Metastatic breast cancer: s/p IR ablation of liver metastasis.  She is currently getting palbociclib and Faslodex which do not have prominent risk of cardiotoxicity.  3. Morbid obesity 4. Deconditioning - continue HHPT  Follow up in 4 months .   CLEGG,AMY 01/21/2015

## 2015-01-21 NOTE — Progress Notes (Signed)
Advanced Heart Failure Medication Review by a Pharmacist  Does the patient  feel that his/her medications are working for him/her?  yes  Has the patient been experiencing any side effects to the medications prescribed?  no  Does the patient measure his/her own blood pressure or blood glucose at home?  yes   Does the patient have any problems obtaining medications due to transportation or finances?   no  Understanding of regimen: good Understanding of indications: good Potential of compliance: good    Pharmacist comments:  Kerri Vasquez is a pleasant 59 yo F presenting with her daughter who manages her medications. She seems to have a good understanding of her regimen and the importance of consistent use. They did not have any specific medication-related questions or concerns for me at this time.  [name]. Velva Harman, PharmD, BCPS, CPP Clinical Pharmacist Pager: [phone] Phone: [phone] 01/21/2015 12:45 PM

## 2015-01-21 NOTE — Patient Instructions (Signed)
FOLLOW UP: 4 months n/p clinic

## 2015-02-03 ENCOUNTER — Other Ambulatory Visit: Payer: Medicare Other

## 2015-02-03 ENCOUNTER — Ambulatory Visit: Payer: Self-pay

## 2015-02-03 ENCOUNTER — Ambulatory Visit: Payer: Self-pay | Admitting: Hematology & Oncology

## 2015-02-10 ENCOUNTER — Ambulatory Visit: Payer: Self-pay

## 2015-02-10 ENCOUNTER — Ambulatory Visit: Payer: Self-pay | Admitting: Hematology & Oncology

## 2015-02-10 ENCOUNTER — Other Ambulatory Visit: Payer: Medicare Other

## 2015-02-12 ENCOUNTER — Other Ambulatory Visit (HOSPITAL_BASED_OUTPATIENT_CLINIC_OR_DEPARTMENT_OTHER): Payer: Medicare Other

## 2015-02-12 ENCOUNTER — Other Ambulatory Visit: Payer: Self-pay | Admitting: *Deleted

## 2015-02-12 ENCOUNTER — Ambulatory Visit (HOSPITAL_BASED_OUTPATIENT_CLINIC_OR_DEPARTMENT_OTHER): Payer: Medicare Other

## 2015-02-12 ENCOUNTER — Encounter: Payer: Self-pay | Admitting: Family

## 2015-02-12 ENCOUNTER — Ambulatory Visit (HOSPITAL_BASED_OUTPATIENT_CLINIC_OR_DEPARTMENT_OTHER): Payer: Medicare Other | Admitting: Family

## 2015-02-12 VITALS — BP 126/77 | HR 81 | Temp 98.0°F | Resp 16 | Ht 66.0 in | Wt 224.0 lb

## 2015-02-12 DIAGNOSIS — C787 Secondary malignant neoplasm of liver and intrahepatic bile duct: Secondary | ICD-10-CM

## 2015-02-12 DIAGNOSIS — D500 Iron deficiency anemia secondary to blood loss (chronic): Secondary | ICD-10-CM

## 2015-02-12 DIAGNOSIS — C50919 Malignant neoplasm of unspecified site of unspecified female breast: Secondary | ICD-10-CM

## 2015-02-12 DIAGNOSIS — R51 Headache: Principal | ICD-10-CM

## 2015-02-12 DIAGNOSIS — D510 Vitamin B12 deficiency anemia due to intrinsic factor deficiency: Secondary | ICD-10-CM

## 2015-02-12 DIAGNOSIS — C50911 Malignant neoplasm of unspecified site of right female breast: Secondary | ICD-10-CM

## 2015-02-12 DIAGNOSIS — G441 Vascular headache, not elsewhere classified: Secondary | ICD-10-CM | POA: Diagnosis not present

## 2015-02-12 DIAGNOSIS — C50912 Malignant neoplasm of unspecified site of left female breast: Secondary | ICD-10-CM | POA: Diagnosis not present

## 2015-02-12 DIAGNOSIS — M79606 Pain in leg, unspecified: Secondary | ICD-10-CM | POA: Diagnosis not present

## 2015-02-12 DIAGNOSIS — R519 Headache, unspecified: Secondary | ICD-10-CM

## 2015-02-12 DIAGNOSIS — Z5111 Encounter for antineoplastic chemotherapy: Secondary | ICD-10-CM

## 2015-02-12 LAB — CBC WITH DIFFERENTIAL (CANCER CENTER ONLY)
BASO#: 0 10*3/uL (ref 0.0–0.2)
BASO%: 0.5 % (ref 0.0–2.0)
EOS%: 2.9 % (ref 0.0–7.0)
Eosinophils Absolute: 0.2 10*3/uL (ref 0.0–0.5)
HEMATOCRIT: 34.7 % — AB (ref 34.8–46.6)
HEMOGLOBIN: 10.9 g/dL — AB (ref 11.6–15.9)
LYMPH#: 1.1 10*3/uL (ref 0.9–3.3)
LYMPH%: 17.8 % (ref 14.0–48.0)
MCH: 29.2 pg (ref 26.0–34.0)
MCHC: 31.4 g/dL — ABNORMAL LOW (ref 32.0–36.0)
MCV: 93 fL (ref 81–101)
MONO#: 0.4 10*3/uL (ref 0.1–0.9)
MONO%: 5.9 % (ref 0.0–13.0)
NEUT%: 72.9 % (ref 39.6–80.0)
NEUTROS ABS: 4.5 10*3/uL (ref 1.5–6.5)
Platelets: 112 10*3/uL — ABNORMAL LOW (ref 145–400)
RBC: 3.73 10*6/uL (ref 3.70–5.32)
RDW: 14.5 % (ref 11.1–15.7)
WBC: 6.2 10*3/uL (ref 3.9–10.0)

## 2015-02-12 LAB — COMPREHENSIVE METABOLIC PANEL (CC13)
ALBUMIN: 3.3 g/dL — AB (ref 3.5–5.0)
ALK PHOS: 76 U/L (ref 40–150)
ALT: 12 U/L (ref 0–55)
AST: 19 U/L (ref 5–34)
Anion Gap: 8 mEq/L (ref 3–11)
BILIRUBIN TOTAL: 0.3 mg/dL (ref 0.20–1.20)
BUN: 23.3 mg/dL (ref 7.0–26.0)
CO2: 26 mEq/L (ref 22–29)
Calcium: 9.1 mg/dL (ref 8.4–10.4)
Chloride: 110 mEq/L — ABNORMAL HIGH (ref 98–109)
Creatinine: 1.2 mg/dL — ABNORMAL HIGH (ref 0.6–1.1)
EGFR: 47 mL/min/{1.73_m2} — ABNORMAL LOW (ref >90)
GLUCOSE: 78 mg/dL (ref 70–140)
Potassium: 5.3 mEq/L — ABNORMAL HIGH (ref 3.5–5.1)
SODIUM: 144 meq/L (ref 136–145)
TOTAL PROTEIN: 7.2 g/dL (ref 6.4–8.3)

## 2015-02-12 MED ORDER — EVEROLIMUS 5 MG PO TABS: 5.0000 mg | ORAL_TABLET | Freq: Every day | ORAL | Status: DC

## 2015-02-12 MED ORDER — CYANOCOBALAMIN 1000 MCG/ML IJ SOLN
INTRAMUSCULAR | Status: AC
  Filled 2015-02-12: qty 1

## 2015-02-12 MED ORDER — FULVESTRANT 250 MG/5ML IM SOLN
500.0000 mg | Freq: Once | INTRAMUSCULAR | Status: AC
  Administered 2015-02-12: 500 mg via INTRAMUSCULAR

## 2015-02-12 MED ORDER — FULVESTRANT 250 MG/5ML IM SOLN
INTRAMUSCULAR | Status: AC
  Filled 2015-02-12: qty 10

## 2015-02-12 MED ORDER — HYDROMORPHONE HCL 4 MG/ML IJ SOLN
INTRAMUSCULAR | Status: AC
  Filled 2015-02-12: qty 1

## 2015-02-12 MED ORDER — LORAZEPAM 0.5 MG PO TABS: ORAL_TABLET | ORAL | Status: DC

## 2015-02-12 MED ORDER — HYDROMORPHONE HCL 1 MG/ML IJ SOLN
2.0000 mg | Freq: Once | INTRAMUSCULAR | Status: AC
  Administered 2015-02-12: 2 mg via INTRAVENOUS

## 2015-02-12 MED ORDER — CYANOCOBALAMIN 1000 MCG/ML IJ SOLN
1000.0000 ug | Freq: Once | INTRAMUSCULAR | Status: AC
  Administered 2015-02-12: 1000 ug via INTRAMUSCULAR

## 2015-02-12 NOTE — Progress Notes (Signed)
Hematology and Oncology Follow Up Visit  Kerri Vasquez [id number] 11/17/1955 59 y.o. 02/12/2015   Principle Diagnosis:  1. Metastatic breast cancer, progressive liver metastases  2. Gastric bypass for subsequent B12 and iron deficiency 3. Osteoarthritis with chronic pain 4. 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 Faslodex 500 mg IM monthly 2. Afinitor 5 mg by mouth daily 2. Vitamin B12 1 mg IM monthly 3. IV iron as indicated    Interim History:  Kerri Vasquez is here today with her daughter for a follow-up. She is feeling much better since the last time she was here. She states that her heart function in improving. She still has fatigue at times.  Her appetite has improved. She is eating anything and everything pickled. She is also craving red meat. She has had some spells with nausea after eating. This resolves with ativan. She is also trying to drink plenty of fluids but has days where she doesn't drink much.  She is followed by pain management Dr. Myles Rosenthal. She has a follow-up appointment with him next week for breakthrough "bone" pain in the legs.  She has some lymphedema in the right arm and would like a compression sleeve.  She denies fever, chills, n/v, cough, rash, dizziness, chest pain, palpitations, abdominal pain pr changes in her bowel or bladder habits. She is making urine. Since starting the lasix she has lost a good deal of weight. She seems to have leveled off and is stable at this time.  No swelling of the legs.  She is able to walk using her cain now. Her daughter is working with her daily to help keep her active and out of a wheelchair.   No numbness or tingling in her extremities.   Medications:    Medication List       This list is accurate as of: 02/12/15  2:06 PM.  Always use your most recent med list.               carvedilol 6.25 MG tablet  Commonly known as:  COREG  Take 6.25 mg by mouth 2 (two) times daily with a  meal.     everolimus 5 MG tablet  Commonly known as:  AFINITOR  Take 1 tablet (5 mg total) by mouth daily.     FLUoxetine 40 MG capsule  Commonly known as:  PROZAC  Take 40 mg by mouth daily before breakfast.     fulvestrant 250 MG/5ML injection  Commonly known as:  FASLODEX  Inject 250 mg into the muscle every 30 (thirty) days.     furosemide 20 MG tablet  Commonly known as:  LASIX  Take 60 mg by mouth daily.     levothyroxine 200 MCG tablet  Commonly known as:  SYNTHROID, LEVOTHROID  Take 200 mcg by mouth daily.     LORazepam 0.5 MG tablet  Commonly known as:  ATIVAN  PLACE 1 TABLET BY MOUTH UNDER THE TONGUE EVERY 6 HOURS AS NEEDED FOR NAUSEA     methocarbamol 750 MG tablet  Commonly known as:  ROBAXIN  Take 750 mg by mouth 4 (four) times daily.     MULTI-VITAMINS Tabs  Take 1 tablet by mouth daily.     nitroGLYCERIN 0.4 MG SL tablet  Commonly known as:  NITROSTAT  Place 1 tablet (0.4 mg total) under the tongue every 5 (five) minutes as needed for chest pain.     ondansetron 8 MG tablet  Commonly  known as:  ZOFRAN  Take 1 tablet (8 mg total) by mouth every 8 (eight) hours as needed for nausea or vomiting.     oxycodone 5 MG capsule  Commonly known as:  OXY-IR  Take 15 mg by mouth every 4 (four) hours.     oxyCODONE-acetaminophen 10-325 MG tablet  Commonly known as:  PERCOCET  Take 1 tablet by mouth every 5 (five) hours as needed (4 x per day). 7-25- "INCREASED BY PAIN DOCTOR"     oxymorphone 15 MG 12 hr tablet  Commonly known as:  OPANA ER  Take 15 mg by mouth every 12 (twelve) hours.     potassium chloride SA 20 MEQ tablet  Commonly known as:  KLOR-CON M20  Take 2 tablets (40 mEq total) by mouth 2 (two) times daily.     pregabalin 200 MG capsule  Commonly known as:  LYRICA  Take 200 mg by mouth 3 (three) times daily.     promethazine 25 MG tablet  Commonly known as:  PHENERGAN  TAKE 1 TABLET BY MOUTH THREE TIMES DAILY AS NEEDED FOR NAUSEA      spironolactone 25 MG tablet  Commonly known as:  ALDACTONE  TAKE 1 TABLET BY MOUTH DAILY     temazepam 15 MG capsule  Commonly known as:  RESTORIL  Take 30 mg by mouth at bedtime.     tiZANidine 4 MG tablet  Commonly known as:  ZANAFLEX  Take 4 mg by mouth every 6 (six) hours as needed for muscle spasms.        Allergies:  Allergies  Allergen Reactions  . Influenza Vac Split [Flu Virus Vaccine] Other (See Comments)    Joint stiffness and renal failure  . Ritalin [Methylphenidate Hcl]     "Heart attack"  . Zostavax [Zoster Vaccine Live ([employer])] Other (See Comments)    Joint stiffness, renal failure  . Adhesive [Tape] Itching and Rash    Past Medical History, Surgical history, Social history, and Family History were reviewed and updated.  Review of Systems: All other 10 point review of systems is negative.   Physical Exam:  height is 5\' 6"  (1.676 m) and weight is 224 lb (101.606 kg). Her oral temperature is 98 F (36.7 C). Her blood pressure is 126/77 and her pulse is 81. Her respiration is 16.   Wt Readings from Last 3 Encounters:  02/12/15 224 lb (101.606 kg)  01/21/15 226 lb (102.513 kg)  11/23/14 233 lb (105.688 kg)    Ocular: Sclerae unicteric, pupils equal, round and reactive to light Ear-nose-throat: Oropharynx clear, dentition fair Lymphatic: No cervical or supraclavicular adenopathy Lungs no rales or rhonchi, good excursion bilaterally Heart regular rate and rhythm, no murmur appreciated Abd soft, nontender, positive bowel sounds MSK no focal spinal tenderness, no joint edema Neuro: non-focal, well-oriented, appropriate affect Breasts: Has had bilateral mastectomies. No change with chest. No lymphadenopathy.   Lab Results  Component Value Date   WBC 6.2 02/12/2015   HGB 10.9* 02/12/2015   HCT 34.7* 02/12/2015   MCV 93 02/12/2015   PLT 112* 02/12/2015   Lab Results  Component Value Date   FERRITIN 1,641* 06/29/2014   IRON 71 12/24/2014   TIBC  154* 12/24/2014   UIBC 82* 12/24/2014   IRONPCTSAT 46 12/24/2014   Lab Results  Component Value Date   RETICCTPCT 1.0 06/29/2014   RBC 3.73 02/12/2015   RETICCTABS 32.3 06/29/2014   No results found for: KPAFRELGTCHN, LAMBDASER, KAPLAMBRATIO No results found for: IGGSERUM,  IGA, [facility] Lab Results  Component Value Date   TOTALPROTELP 4.7* 01/31/2010   ALBUMINELP 39.4* 01/31/2010   A1GS 12.1* 01/31/2010   A2GS 20.3* 01/31/2010   BETS 6.0 01/31/2010   BETA2SER 6.8* 01/31/2010   GAMS 15.4 01/31/2010   MSPIKE NOT DETECTED 01/31/2010   SPEI  01/31/2010    (NOTE) The possibility of a faint restricted band(s) cannot be completely excluded in the gamma region.  Suggest serum IFE to evaluate possibility, if clinically indicated. (Lab will hold sample one week. Please call Customer Service at [phone] to  add test.) Reviewed by Odis Hollingshead, MD, PhD, FCAP (Electronic Signature on File)     Chemistry      Component Value Date/Time   NA 142 12/31/2014 1007   NA 142 10/05/2014 1052   K 4.0 12/31/2014 1007   K 4.5 10/05/2014 1052   CL 102 12/31/2014 1007   CL 109 10/05/2014 1052   CO2 27 12/31/2014 1007   CO2 24 10/05/2014 1052   BUN 26* 12/31/2014 1007   BUN 33* 10/05/2014 1052   CREATININE 1.1 12/31/2014 1007   CREATININE 1.47* 10/05/2014 1052      Component Value Date/Time   CALCIUM 8.5 12/31/2014 1007   CALCIUM 8.2* 10/05/2014 1052   ALKPHOS 92* 12/31/2014 1007   ALKPHOS 96 10/05/2014 1052   AST 32 12/31/2014 1007   AST 13 10/05/2014 1052   ALT 36 12/31/2014 1007   ALT 10 10/05/2014 1052   BILITOT 0.60 12/31/2014 1007   BILITOT 0.6 10/05/2014 1052     Impression and Plan: Impression and Plan: Kerri Vasquez is 59 year old female with metastatic breast cancer with mets to the liver. She looks much better today. She still has chronic fatigue but is very encouraged that her heart function has improved.  She will her her B 12 injection today and will continue her  monthly Faslodex. We will repeat a PET scan in 3 weeks.  We did give her a dose of pain medication while she was here in the office for her c/o "bone" pain in her legs.  She has her prescription for compression sleeves.  We will plan to see her back in 1 months for labs and follow-up. Both she and her daughter know to contact us with any questions or concerns. We can certainly see her sooner if need be.   Eliezer Bottom, NP 10/14/20162:06 PM

## 2015-02-12 NOTE — Patient Instructions (Signed)
Fulvestrant injection What is this medicine? FULVESTRANT (ful VES trant) blocks the effects of estrogen. It is used to treat breast cancer. This medicine may be used for other purposes; ask your health care provider or pharmacist if you have questions. What should I tell my health care provider before I take this medicine? They need to know if you have any of these conditions: -bleeding problems -liver disease -low levels of platelets in the blood -an unusual or allergic reaction to fulvestrant, other medicines, foods, dyes, or preservatives -pregnant or trying to get pregnant -breast-feeding How should I use this medicine? This medicine is for injection into a muscle. It is usually given by a health care professional in a hospital or clinic setting. Talk to your pediatrician regarding the use of this medicine in children. Special care may be needed. Overdosage: If you think you have taken too much of this medicine contact a poison control center or emergency room at once. NOTE: This medicine is only for you. Do not share this medicine with others. What if I miss a dose? It is important not to miss your dose. Call your doctor or health care professional if you are unable to keep an appointment. What may interact with this medicine? -medicines that treat or prevent blood clots like warfarin, enoxaparin, and dalteparin This list may not describe all possible interactions. Give your health care provider a list of all the medicines, herbs, non-prescription drugs, or dietary supplements you use. Also tell them if you smoke, drink alcohol, or use illegal drugs. Some items may interact with your medicine. What should I watch for while using this medicine? Your condition will be monitored carefully while you are receiving this medicine. You will need important blood work done while you are taking this medicine. Do not become pregnant while taking this medicine or for at least 1 year after stopping  it. Women of child-bearing potential will need to have a negative pregnancy test before starting this medicine. Women should inform their doctor if they wish to become pregnant or think they might be pregnant. There is a potential for serious side effects to an unborn child. Men should inform their doctors if they wish to father a child. This medicine may lower sperm counts. Talk to your health care professional or pharmacist for more information. Do not breast-feed an infant while taking this medicine or for 1 year after the last dose. What side effects may I notice from receiving this medicine? Side effects that you should report to your doctor or health care professional as soon as possible: -allergic reactions like skin rash, itching or hives, swelling of the face, lips, or tongue -feeling faint or lightheaded, falls -pain, tingling, numbness, or weakness in the legs -signs and symptoms of infection like fever or chills; cough; flu-like symptoms; sore throat -vaginal bleeding Side effects that usually do not require medical attention (report to your doctor or health care professional if they continue or are bothersome): -aches, pains -constipation -diarrhea -headache -hot flashes -nausea, vomiting -pain at site where injected -stomach pain This list may not describe all possible side effects. Call your doctor for medical advice about side effects. You may report side effects to FDA at 1-800-FDA-1088. Where should I keep my medicine? This drug is given in a hospital or clinic and will not be stored at home. NOTE: This sheet is a summary. It may not cover all possible information. If you have questions about this medicine, talk to your doctor, pharmacist, or health  care provider.    2016, Elsevier/Gold Standard. (2014-11-13 11:03:55)  Cyanocobalamin, Vitamin B12 injection What is this medicine? CYANOCOBALAMIN (sye an oh koe BAL a min) is a man made form of vitamin B12. Vitamin B12 is  used in the growth of healthy blood cells, nerve cells, and proteins in the body. It also helps with the metabolism of fats and carbohydrates. This medicine is used to treat people who can not absorb vitamin B12. This medicine may be used for other purposes; ask your health care provider or pharmacist if you have questions. What should I tell my health care provider before I take this medicine? They need to know if you have any of these conditions: -kidney disease -Leber's disease -megaloblastic anemia -an unusual or allergic reaction to cyanocobalamin, cobalt, other medicines, foods, dyes, or preservatives -pregnant or trying to get pregnant -breast-feeding How should I use this medicine? This medicine is injected into a muscle or deeply under the skin. It is usually given by a health care professional in a clinic or doctor's office. However, your doctor may teach you how to inject yourself. Follow all instructions. Talk to your pediatrician regarding the use of this medicine in children. Special care may be needed. Overdosage: If you think you have taken too much of this medicine contact a poison control center or emergency room at once. NOTE: This medicine is only for you. Do not share this medicine with others. What if I miss a dose? If you are given your dose at a clinic or doctor's office, call to reschedule your appointment. If you give your own injections and you miss a dose, take it as soon as you can. If it is almost time for your next dose, take only that dose. Do not take double or extra doses. What may interact with this medicine? -colchicine -heavy alcohol intake This list may not describe all possible interactions. Give your health care provider a list of all the medicines, herbs, non-prescription drugs, or dietary supplements you use. Also tell them if you smoke, drink alcohol, or use illegal drugs. Some items may interact with your medicine. What should I watch for while using  this medicine? Visit your doctor or health care professional regularly. You may need blood work done while you are taking this medicine. You may need to follow a special diet. Talk to your doctor. Limit your alcohol intake and avoid smoking to get the best benefit. What side effects may I notice from receiving this medicine? Side effects that you should report to your doctor or health care professional as soon as possible: -allergic reactions like skin rash, itching or hives, swelling of the face, lips, or tongue -blue tint to skin -chest tightness, pain -difficulty breathing, wheezing -dizziness -red, swollen painful area on the leg Side effects that usually do not require medical attention (report to your doctor or health care professional if they continue or are bothersome): -diarrhea -headache This list may not describe all possible side effects. Call your doctor for medical advice about side effects. You may report side effects to FDA at 1-800-FDA-1088. Where should I keep my medicine? Keep out of the reach of children. Store at room temperature between 15 and 30 degrees C (59 and 85 degrees F). Protect from light. Throw away any unused medicine after the expiration date. NOTE: This sheet is a summary. It may not cover all possible information. If you have questions about this medicine, talk to your doctor, pharmacist, or health care provider.  2016, Elsevier/Gold Standard. (2007-07-29 22:10:20)  Hydromorphone injection What is this medicine? HYDROMORPHONE (hye droe MOR fone) is a pain reliever. It is used to treat moderate to severe pain. This medicine may be used for other purposes; ask your health care provider or pharmacist if you have questions. What should I tell my health care provider before I take this medicine? They need to know if you have any of these conditions: -brain tumor -drug abuse or addiction -head injury -heart disease -frequently drink alcohol containing  drinks -kidney disease -liver disease -lung disease, asthma, or breathing problems -an allergic or unusual reaction to hydromorphone, other opioid analgesics, latex, other medicines, foods, dyes, or preservatives -pregnant or trying to get pregnant -breast-feeding How should I use this medicine? This medicine is for injection into a vein, into a muscle, or under the skin. It is usually given by a health care professional in a hospital or clinic setting. If you get this medicine at home, you will be taught how to prepare and give this medicine. Use exactly as directed. Take your medicine at regular intervals. Do not take your medicine more often than directed. It is important that you put your used needles and syringes in a special sharps container. Do not put them in a trash can. If you do not have a sharps container, call your pharmacist or healthcare provider to get one. Talk to your pediatrician regarding the use of this medicine in children. This medicine is not approved for use in children. Overdosage: If you think you have taken too much of this medicine contact a poison control center or emergency room at once. NOTE: This medicine is only for you. Do not share this medicine with others. What if I miss a dose? If you miss a dose, use it as soon as you can. If it is almost time for your next dose, use only that dose. Do not use double or extra doses. What may interact with this medicine? -alcohol -antihistamines for allergy, cough and cold -medicines for anesthesia -medicines for depression, anxiety, or psychotic disturbances -medicines for sleep -muscle relaxants -naltrexone -narcotic medicines (opiates) for pain -phenothiazines like chlorpromazine, mesoridazine, prochlorperazine, thioridazine -tramadol This list may not describe all possible interactions. Give your health care provider a list of all the medicines, herbs, non-prescription drugs, or dietary supplements you use. Also  tell them if you smoke, drink alcohol, or use illegal drugs. Some items may interact with your medicine. What should I watch for while using this medicine? Tell your doctor or health care professional if your pain does not go away, if it gets worse, or if you have new or a different type of pain. You may develop tolerance to the medicine. Tolerance means that you will need a higher dose of the medicine for pain relief. Tolerance is normal and is expected if you take this medicine for a long time. Do not suddenly stop taking your medicine because you may develop a severe reaction. Your body becomes used to the medicine. This does NOT mean you are addicted. Addiction is a behavior related to getting and using a drug for a non-medical reason. If you have pain, you have a medical reason to take pain medicine. Your doctor will tell you how much medicine to take. If your doctor wants you to stop the medicine, the dose will be slowly lowered over time to avoid any side effects. You may get drowsy or dizzy. Do not drive, use machinery, or do anything that needs mental  alertness until you know how this medicine affects you. Do not stand or sit up quickly, especially if you are an older patient. This reduces the risk of dizzy or fainting spells. Alcohol may interfere with the effect of this medicine. Avoid alcoholic drinks. There are different types of narcotic medicines (opiates) for pain. If you take more than one type at the same time, you may have more side effects. Give your health care provider a list of all medicines you use. Your doctor will tell you how much medicine to take. Do not take more medicine than directed. Call emergency for help if you have problems breathing. This medicine will cause constipation. Try to have a bowel movement at least every 2 to 3 days. If you do not have a bowel movement for 3 days, call your doctor or health care professional. Your mouth may get dry. Chewing sugarless gum or  sucking hard candy, and drinking plenty of water may help. Contact your doctor if the problem does not go away or is severe. What side effects may I notice from receiving this medicine? Side effects that you should report to your doctor or health care professional as soon as possible: -allergic reactions like skin rash, itching or hives, swelling of the face, lips, or tongue -breathing problems -changes in vision -confusion -feeling faint or lightheaded, falls -seizures -slow or fast heartbeat -trouble passing urine or change in the amount of urine -trouble with balance, talking, walking -unusually weak or tired Side effects that usually do not require medical attention (report to your doctor or health care professional if they continue or are bothersome): -difficulty sleeping -drowsiness -dry mouth -flushing -headache -itching -loss of appetite -nausea, vomiting This list may not describe all possible side effects. Call your doctor for medical advice about side effects. You may report side effects to FDA at 1-800-FDA-1088. Where should I keep my medicine? Keep out of the reach of children. This medicine can be abused. Keep your medicine in a safe place to protect it from theft. Do not share this medicine with anyone. Selling or giving away this medicine is dangerous and against the law. If you are using this medicine at home, you will be instructed on how to store this medicine. This medicine may cause accidental overdose and death if it is taken by other adults, children, or pets. Flush any unused medicine down the toilet to reduce the chance of harm. Do not use the medicine after the expiration date. NOTE: This sheet is a summary. It may not cover all possible information. If you have questions about this medicine, talk to your doctor, pharmacist, or health care provider.    2016, Elsevier/Gold Standard. (2012-11-19 10:47:33)

## 2015-02-12 NOTE — Progress Notes (Signed)
Patient refused to stay post pain medication (Dilaudid) for observation.

## 2015-02-13 LAB — CANCER ANTIGEN 27.29: CA 27.29: 52 U/mL — ABNORMAL HIGH (ref 0–39)

## 2015-02-13 LAB — LACTATE DEHYDROGENASE: LDH: 182 U/L (ref 94–250)

## 2015-02-15 LAB — FERRITIN CHCC: Ferritin: 869 ng/ml — ABNORMAL HIGH (ref 9–269)

## 2015-02-15 LAB — IRON AND TIBC CHCC
%SAT: 36 % (ref 21–57)
Iron: 69 ug/dL (ref 41–142)
TIBC: 193 ug/dL — AB (ref 236–444)
UIBC: 123 ug/dL (ref 120–384)

## 2015-02-23 ENCOUNTER — Telehealth: Payer: Self-pay | Admitting: Emergency Medicine

## 2015-02-23 NOTE — Telephone Encounter (Signed)
Patient's daughter called inquiring about what her mother can take prior to her PET scan for anxiety and claustrophobia. Per Judson Roch NP, patient was instructed to take one Lorazepam 0.5 mg prior to leaving her house and then take a second one upon arrival to radiology. Patient's daughter verbalized understanding.

## 2015-02-24 ENCOUNTER — Ambulatory Visit (HOSPITAL_COMMUNITY): Payer: Medicare Other

## 2015-02-24 DIAGNOSIS — G603 Idiopathic progressive neuropathy: Secondary | ICD-10-CM | POA: Diagnosis not present

## 2015-02-24 DIAGNOSIS — M4726 Other spondylosis with radiculopathy, lumbar region: Secondary | ICD-10-CM | POA: Diagnosis not present

## 2015-02-24 DIAGNOSIS — G894 Chronic pain syndrome: Secondary | ICD-10-CM | POA: Diagnosis not present

## 2015-02-24 DIAGNOSIS — M25532 Pain in left wrist: Secondary | ICD-10-CM | POA: Diagnosis not present

## 2015-02-26 ENCOUNTER — Other Ambulatory Visit: Payer: Self-pay | Admitting: *Deleted

## 2015-02-26 DIAGNOSIS — C787 Secondary malignant neoplasm of liver and intrahepatic bile duct: Principal | ICD-10-CM

## 2015-02-26 DIAGNOSIS — C50911 Malignant neoplasm of unspecified site of right female breast: Secondary | ICD-10-CM

## 2015-02-26 DIAGNOSIS — D510 Vitamin B12 deficiency anemia due to intrinsic factor deficiency: Secondary | ICD-10-CM

## 2015-02-26 DIAGNOSIS — C50919 Malignant neoplasm of unspecified site of unspecified female breast: Secondary | ICD-10-CM

## 2015-02-26 DIAGNOSIS — D500 Iron deficiency anemia secondary to blood loss (chronic): Secondary | ICD-10-CM

## 2015-02-26 MED ORDER — LORAZEPAM 0.5 MG PO TABS: ORAL_TABLET | ORAL | Status: DC

## 2015-03-09 ENCOUNTER — Ambulatory Visit (HOSPITAL_COMMUNITY)
Admission: RE | Admit: 2015-03-09 | Discharge: 2015-03-09 | Disposition: A | Discharge status: Home / Self Care | Payer: Medicare Other | Source: Ambulatory Visit | Attending: Family | Admitting: Family

## 2015-03-09 DIAGNOSIS — D510 Vitamin B12 deficiency anemia due to intrinsic factor deficiency: Secondary | ICD-10-CM | POA: Diagnosis not present

## 2015-03-09 DIAGNOSIS — Z85850 Personal history of malignant neoplasm of thyroid: Secondary | ICD-10-CM | POA: Diagnosis not present

## 2015-03-09 DIAGNOSIS — Z9882 Breast implant status: Secondary | ICD-10-CM | POA: Diagnosis not present

## 2015-03-09 DIAGNOSIS — C50919 Malignant neoplasm of unspecified site of unspecified female breast: Secondary | ICD-10-CM

## 2015-03-09 DIAGNOSIS — M4726 Other spondylosis with radiculopathy, lumbar region: Secondary | ICD-10-CM | POA: Diagnosis not present

## 2015-03-09 DIAGNOSIS — Z79899 Other long term (current) drug therapy: Secondary | ICD-10-CM | POA: Diagnosis not present

## 2015-03-09 DIAGNOSIS — D500 Iron deficiency anemia secondary to blood loss (chronic): Secondary | ICD-10-CM | POA: Diagnosis not present

## 2015-03-09 DIAGNOSIS — C787 Secondary malignant neoplasm of liver and intrahepatic bile duct: Secondary | ICD-10-CM | POA: Diagnosis not present

## 2015-03-09 DIAGNOSIS — Z79891 Long term (current) use of opiate analgesic: Secondary | ICD-10-CM | POA: Diagnosis not present

## 2015-03-09 DIAGNOSIS — K439 Ventral hernia without obstruction or gangrene: Secondary | ICD-10-CM | POA: Diagnosis not present

## 2015-03-09 DIAGNOSIS — G603 Idiopathic progressive neuropathy: Secondary | ICD-10-CM | POA: Diagnosis not present

## 2015-03-09 DIAGNOSIS — G894 Chronic pain syndrome: Secondary | ICD-10-CM | POA: Diagnosis not present

## 2015-03-09 DIAGNOSIS — C50911 Malignant neoplasm of unspecified site of right female breast: Secondary | ICD-10-CM | POA: Insufficient documentation

## 2015-03-09 LAB — GLUCOSE, CAPILLARY: GLUCOSE-CAPILLARY: 75 mg/dL (ref 65–99)

## 2015-03-09 MED ORDER — FLUDEOXYGLUCOSE F - 18 (FDG) INJECTION
11.1400 | Freq: Once | INTRAVENOUS | Status: DC | PRN
  Administered 2015-03-09: 11.14 via INTRAVENOUS
  Filled 2015-03-09: qty 11.14

## 2015-03-11 ENCOUNTER — Encounter: Payer: Self-pay | Admitting: Family

## 2015-03-11 ENCOUNTER — Telehealth: Payer: Self-pay | Admitting: Family

## 2015-03-11 NOTE — Telephone Encounter (Signed)
I spoke with [name]'s daughter and went over the patients PET scan results from earlier this week. All questions were answered and we will plan to see her on November 17th for her next follow-up.

## 2015-03-15 ENCOUNTER — Other Ambulatory Visit (HOSPITAL_COMMUNITY): Payer: Self-pay | Admitting: *Deleted

## 2015-03-15 ENCOUNTER — Other Ambulatory Visit: Payer: Self-pay

## 2015-03-15 ENCOUNTER — Ambulatory Visit: Payer: Self-pay | Admitting: Family

## 2015-03-15 ENCOUNTER — Ambulatory Visit: Payer: Self-pay

## 2015-03-15 MED ORDER — SPIRONOLACTONE 25 MG PO TABS: 25.0000 mg | ORAL_TABLET | Freq: Every day | ORAL | Status: DC

## 2015-03-18 ENCOUNTER — Ambulatory Visit (HOSPITAL_BASED_OUTPATIENT_CLINIC_OR_DEPARTMENT_OTHER): Payer: Medicare Other | Admitting: Family

## 2015-03-18 ENCOUNTER — Other Ambulatory Visit: Payer: Self-pay | Admitting: Family

## 2015-03-18 ENCOUNTER — Other Ambulatory Visit: Payer: Self-pay | Admitting: Nurse Practitioner

## 2015-03-18 ENCOUNTER — Other Ambulatory Visit (HOSPITAL_BASED_OUTPATIENT_CLINIC_OR_DEPARTMENT_OTHER): Payer: Medicare Other

## 2015-03-18 ENCOUNTER — Ambulatory Visit (HOSPITAL_BASED_OUTPATIENT_CLINIC_OR_DEPARTMENT_OTHER): Payer: Medicare Other

## 2015-03-18 ENCOUNTER — Encounter: Payer: Self-pay | Admitting: Family

## 2015-03-18 VITALS — BP 130/57 | HR 67 | Resp 20

## 2015-03-18 VITALS — BP 117/68 | HR 73 | Temp 98.0°F | Resp 16 | Ht 66.0 in | Wt 218.0 lb

## 2015-03-18 DIAGNOSIS — D510 Vitamin B12 deficiency anemia due to intrinsic factor deficiency: Secondary | ICD-10-CM

## 2015-03-18 DIAGNOSIS — C50911 Malignant neoplasm of unspecified site of right female breast: Secondary | ICD-10-CM

## 2015-03-18 DIAGNOSIS — C787 Secondary malignant neoplasm of liver and intrahepatic bile duct: Principal | ICD-10-CM

## 2015-03-18 DIAGNOSIS — C50919 Malignant neoplasm of unspecified site of unspecified female breast: Secondary | ICD-10-CM

## 2015-03-18 DIAGNOSIS — R52 Pain, unspecified: Secondary | ICD-10-CM

## 2015-03-18 DIAGNOSIS — I890 Lymphedema, not elsewhere classified: Secondary | ICD-10-CM | POA: Diagnosis not present

## 2015-03-18 DIAGNOSIS — S025XXA Fracture of tooth (traumatic), initial encounter for closed fracture: Secondary | ICD-10-CM

## 2015-03-18 DIAGNOSIS — Z5111 Encounter for antineoplastic chemotherapy: Secondary | ICD-10-CM | POA: Diagnosis present

## 2015-03-18 DIAGNOSIS — E538 Deficiency of other specified B group vitamins: Secondary | ICD-10-CM

## 2015-03-18 DIAGNOSIS — D500 Iron deficiency anemia secondary to blood loss (chronic): Secondary | ICD-10-CM

## 2015-03-18 LAB — CBC WITH DIFFERENTIAL (CANCER CENTER ONLY)
BASO#: 0 10*3/uL (ref 0.0–0.2)
BASO%: 0.6 % (ref 0.0–2.0)
EOS ABS: 0.1 10*3/uL (ref 0.0–0.5)
EOS%: 2 % (ref 0.0–7.0)
HEMATOCRIT: 35.6 % (ref 34.8–46.6)
HGB: 11.6 g/dL (ref 11.6–15.9)
LYMPH#: 1.2 10*3/uL (ref 0.9–3.3)
LYMPH%: 21.4 % (ref 14.0–48.0)
MCH: 29.1 pg (ref 26.0–34.0)
MCHC: 32.6 g/dL (ref 32.0–36.0)
MCV: 89 fL (ref 81–101)
MONO#: 0.6 10*3/uL (ref 0.1–0.9)
MONO%: 10.2 % (ref 0.0–13.0)
NEUT#: 3.5 10*3/uL (ref 1.5–6.5)
NEUT%: 65.8 % (ref 39.6–80.0)
PLATELETS: 133 10*3/uL — AB (ref 145–400)
RBC: 3.98 10*6/uL (ref 3.70–5.32)
RDW: 13.5 % (ref 11.1–15.7)
WBC: 5.4 10*3/uL (ref 3.9–10.0)

## 2015-03-18 LAB — COMPREHENSIVE METABOLIC PANEL (CC13)
ALK PHOS: 74 U/L (ref 40–150)
ALT: 11 U/L (ref 0–55)
ANION GAP: 9 meq/L (ref 3–11)
AST: 21 U/L (ref 5–34)
Albumin: 3.6 g/dL (ref 3.5–5.0)
BILIRUBIN TOTAL: 0.57 mg/dL (ref 0.20–1.20)
BUN: 12.5 mg/dL (ref 7.0–26.0)
CALCIUM: 9.2 mg/dL (ref 8.4–10.4)
CHLORIDE: 105 meq/L (ref 98–109)
CO2: 26 meq/L (ref 22–29)
CREATININE: 1.2 mg/dL — AB (ref 0.6–1.1)
EGFR: 48 mL/min/{1.73_m2} — AB (ref >90)
GLUCOSE: 91 mg/dL (ref 70–140)
POTASSIUM: 3.8 meq/L (ref 3.5–5.1)
Sodium: 141 mEq/L (ref 136–145)
Total Protein: 7.4 g/dL (ref 6.4–8.3)

## 2015-03-18 LAB — LACTATE DEHYDROGENASE (CC13): LDH: 203 U/L (ref 125–245)

## 2015-03-18 LAB — IRON AND TIBC CHCC
%SAT: 22 % (ref 21–57)
IRON: 48 ug/dL (ref 41–142)
TIBC: 223 ug/dL — AB (ref 236–444)
UIBC: 174 ug/dL (ref 120–384)

## 2015-03-18 LAB — FERRITIN CHCC: Ferritin: 817 ng/ml — ABNORMAL HIGH (ref 9–269)

## 2015-03-18 MED ORDER — FULVESTRANT 250 MG/5ML IM SOLN
500.0000 mg | Freq: Once | INTRAMUSCULAR | Status: AC
  Administered 2015-03-18: 500 mg via INTRAMUSCULAR

## 2015-03-18 MED ORDER — LORAZEPAM 0.5 MG PO TABS: ORAL_TABLET | ORAL | Status: DC

## 2015-03-18 MED ORDER — CYANOCOBALAMIN 1000 MCG/ML IJ SOLN
1000.0000 ug | Freq: Once | INTRAMUSCULAR | Status: AC
  Administered 2015-03-18: 1000 ug via INTRAMUSCULAR

## 2015-03-18 MED ORDER — HYDROMORPHONE HCL 1 MG/ML IJ SOLN
1.0000 mg | Freq: Once | INTRAMUSCULAR | Status: AC
Start: 2015-03-18 — End: 2015-03-18
  Administered 2015-03-18: 1 mg via SUBCUTANEOUS

## 2015-03-18 MED ORDER — HYDROMORPHONE HCL 1 MG/ML IJ SOLN
1.0000 mg | Freq: Once | INTRAMUSCULAR | Status: AC
  Administered 2015-03-18: 1 mg via SUBCUTANEOUS

## 2015-03-18 MED ORDER — FULVESTRANT 250 MG/5ML IM SOLN
INTRAMUSCULAR | Status: AC
  Filled 2015-03-18: qty 10

## 2015-03-18 MED ORDER — PROMETHAZINE HCL 25 MG PO TABS: ORAL_TABLET | ORAL | Status: DC

## 2015-03-18 MED ORDER — HYDROMORPHONE HCL 1 MG/ML IJ SOLN
INTRAMUSCULAR | Status: AC
  Filled 2015-03-18: qty 1

## 2015-03-18 MED ORDER — CYANOCOBALAMIN 1000 MCG/ML IJ SOLN
INTRAMUSCULAR | Status: AC
  Filled 2015-03-18: qty 1

## 2015-03-18 MED ORDER — CYANOCOBALAMIN 1000 MCG/ML IJ KIT: 1.0000 mL | PACK | INTRAMUSCULAR | Status: DC

## 2015-03-18 NOTE — Patient Instructions (Signed)
Hydromorphone injection What is this medicine? HYDROMORPHONE (hye droe MOR fone) is a pain reliever. It is used to treat moderate to severe pain. This medicine may be used for other purposes; ask your health care provider or pharmacist if you have questions. What should I tell my health care provider before I take this medicine? They need to know if you have any of these conditions: -brain tumor -drug abuse or addiction -head injury -heart disease -frequently drink alcohol containing drinks -kidney disease -liver disease -lung disease, asthma, or breathing problems -an allergic or unusual reaction to hydromorphone, other opioid analgesics, latex, other medicines, foods, dyes, or preservatives -pregnant or trying to get pregnant -breast-feeding How should I use this medicine? This medicine is for injection into a vein, into a muscle, or under the skin. It is usually given by a health care professional in a hospital or clinic setting. If you get this medicine at home, you will be taught how to prepare and give this medicine. Use exactly as directed. Take your medicine at regular intervals. Do not take your medicine more often than directed. It is important that you put your used needles and syringes in a special sharps container. Do not put them in a trash can. If you do not have a sharps container, call your pharmacist or healthcare provider to get one. Talk to your pediatrician regarding the use of this medicine in children. This medicine is not approved for use in children. Overdosage: If you think you have taken too much of this medicine contact a poison control center or emergency room at once. NOTE: This medicine is only for you. Do not share this medicine with others. What if I miss a dose? If you miss a dose, use it as soon as you can. If it is almost time for your next dose, use only that dose. Do not use double or extra doses. What may interact with this  medicine? -alcohol -antihistamines for allergy, cough and cold -medicines for anesthesia -medicines for depression, anxiety, or psychotic disturbances -medicines for sleep -muscle relaxants -naltrexone -narcotic medicines (opiates) for pain -phenothiazines like chlorpromazine, mesoridazine, prochlorperazine, thioridazine -tramadol This list may not describe all possible interactions. Give your health care provider a list of all the medicines, herbs, non-prescription drugs, or dietary supplements you use. Also tell them if you smoke, drink alcohol, or use illegal drugs. Some items may interact with your medicine. What should I watch for while using this medicine? Tell your doctor or health care professional if your pain does not go away, if it gets worse, or if you have new or a different type of pain. You may develop tolerance to the medicine. Tolerance means that you will need a higher dose of the medicine for pain relief. Tolerance is normal and is expected if you take this medicine for a long time. Do not suddenly stop taking your medicine because you may develop a severe reaction. Your body becomes used to the medicine. This does NOT mean you are addicted. Addiction is a behavior related to getting and using a drug for a non-medical reason. If you have pain, you have a medical reason to take pain medicine. Your doctor will tell you how much medicine to take. If your doctor wants you to stop the medicine, the dose will be slowly lowered over time to avoid any side effects. You may get drowsy or dizzy. Do not drive, use machinery, or do anything that needs mental alertness until you know how this medicine  affects you. Do not stand or sit up quickly, especially if you are an older patient. This reduces the risk of dizzy or fainting spells. Alcohol may interfere with the effect of this medicine. Avoid alcoholic drinks. There are different types of narcotic medicines (opiates) for pain. If you take  more than one type at the same time, you may have more side effects. Give your health care provider a list of all medicines you use. Your doctor will tell you how much medicine to take. Do not take more medicine than directed. Call emergency for help if you have problems breathing. This medicine will cause constipation. Try to have a bowel movement at least every 2 to 3 days. If you do not have a bowel movement for 3 days, call your doctor or health care professional. Your mouth may get dry. Chewing sugarless gum or sucking hard candy, and drinking plenty of water may help. Contact your doctor if the problem does not go away or is severe. What side effects may I notice from receiving this medicine? Side effects that you should report to your doctor or health care professional as soon as possible: -allergic reactions like skin rash, itching or hives, swelling of the face, lips, or tongue -breathing problems -changes in vision -confusion -feeling faint or lightheaded, falls -seizures -slow or fast heartbeat -trouble passing urine or change in the amount of urine -trouble with balance, talking, walking -unusually weak or tired Side effects that usually do not require medical attention (report to your doctor or health care professional if they continue or are bothersome): -difficulty sleeping -drowsiness -dry mouth -flushing -headache -itching -loss of appetite -nausea, vomiting This list may not describe all possible side effects. Call your doctor for medical advice about side effects. You may report side effects to FDA at 1-800-FDA-1088. Where should I keep my medicine? Keep out of the reach of children. This medicine can be abused. Keep your medicine in a safe place to protect it from theft. Do not share this medicine with anyone. Selling or giving away this medicine is dangerous and against the law. If you are using this medicine at home, you will be instructed on how to store this  medicine. This medicine may cause accidental overdose and death if it is taken by other adults, children, or pets. Flush any unused medicine down the toilet to reduce the chance of harm. Do not use the medicine after the expiration date. NOTE: This sheet is a summary. It may not cover all possible information. If you have questions about this medicine, talk to your doctor, pharmacist, or health care provider.    2016, Elsevier/Gold Standard. (2012-11-19 10:47:33) Fulvestrant injection What is this medicine? FULVESTRANT (ful VES trant) blocks the effects of estrogen. It is used to treat breast cancer. This medicine may be used for other purposes; ask your health care provider or pharmacist if you have questions. What should I tell my health care provider before I take this medicine? They need to know if you have any of these conditions: -bleeding problems -liver disease -low levels of platelets in the blood -an unusual or allergic reaction to fulvestrant, other medicines, foods, dyes, or preservatives -pregnant or trying to get pregnant -breast-feeding How should I use this medicine? This medicine is for injection into a muscle. It is usually given by a health care professional in a hospital or clinic setting. Talk to your pediatrician regarding the use of this medicine in children. Special care may be needed. Overdosage: If  you think you have taken too much of this medicine contact a poison control center or emergency room at once. NOTE: This medicine is only for you. Do not share this medicine with others. What if I miss a dose? It is important not to miss your dose. Call your doctor or health care professional if you are unable to keep an appointment. What may interact with this medicine? -medicines that treat or prevent blood clots like warfarin, enoxaparin, and dalteparin This list may not describe all possible interactions. Give your health care provider a list of all the medicines,  herbs, non-prescription drugs, or dietary supplements you use. Also tell them if you smoke, drink alcohol, or use illegal drugs. Some items may interact with your medicine. What should I watch for while using this medicine? Your condition will be monitored carefully while you are receiving this medicine. You will need important blood work done while you are taking this medicine. Do not become pregnant while taking this medicine or for at least 1 year after stopping it. Women of child-bearing potential will need to have a negative pregnancy test before starting this medicine. Women should inform their doctor if they wish to become pregnant or think they might be pregnant. There is a potential for serious side effects to an unborn child. Men should inform their doctors if they wish to father a child. This medicine may lower sperm counts. Talk to your health care professional or pharmacist for more information. Do not breast-feed an infant while taking this medicine or for 1 year after the last dose. What side effects may I notice from receiving this medicine? Side effects that you should report to your doctor or health care professional as soon as possible: -allergic reactions like skin rash, itching or hives, swelling of the face, lips, or tongue -feeling faint or lightheaded, falls -pain, tingling, numbness, or weakness in the legs -signs and symptoms of infection like fever or chills; cough; flu-like symptoms; sore throat -vaginal bleeding Side effects that usually do not require medical attention (report to your doctor or health care professional if they continue or are bothersome): -aches, pains -constipation -diarrhea -headache -hot flashes -nausea, vomiting -pain at site where injected -stomach pain This list may not describe all possible side effects. Call your doctor for medical advice about side effects. You may report side effects to FDA at 1-800-FDA-1088. Where should I keep my  medicine? This drug is given in a hospital or clinic and will not be stored at home. NOTE: This sheet is a summary. It may not cover all possible information. If you have questions about this medicine, talk to your doctor, pharmacist, or health care provider.    2016, Elsevier/Gold Standard. (2014-11-13 11:03:55) Cyanocobalamin, Vitamin B12 injection O que  este medicamento? A CIANOCOBALAMINA  uma forma sinttica de vitamina B12. A vitamina B12  essencial para o desenvolvimento de glbulos sanguneos, clulas nervosas e protenas saudveis pelo organismo. Tambm ajuda no metabolismo das gorduras e carboidratos. Este medicamento  usado para tratar pessoas que no conseguem absorver vitamina B12 suficiente. Este medicamento pode ser usado para outros propsitos; em caso de dvidas, pergunte ao seu profissional de sade ou farmacutico. O que devo dizer a meu profissional de sade antes de tomar este medicamento? Precisam saber se voc tem algum dos seguintes problemas ou estados de sade: -doenas renais -sndrome ou doena de [place] -anemia megaloblstica -reao estranha ou alergia  cianocobalamina ou ao cobalto -reao estranha ou alergia a outros medicamentos, alimentos, corantes ou  conservantes -est grvida ou tentando engravidar -est [employer] devo usar este medicamento? Este medicamento  injetado por via intramuscular ou por injeo subcutnea profunda. Costuma ser administrado por um profissional de sade em consultrio ou Chief of Staff. Porm,  possvel que seu mdico lhe ensine como aplicar suas prprias injees. Siga todas as instrues. Fale com seu pediatra a respeito do uso deste medicamento em crianas. Pode ser preciso tomar alguns cuidados especiais. Superdosagem: Se achar que tomou uma superdosagem deste medicamento, entre em contato imediatamente com o [facility] ou v a [employer]. OBSERVAO: Este medicamento  s para voc. No  compartilhe este medicamento com outras pessoas. E se eu deixar de tomar uma dose? Se toma o medicamento em uma clnica ou no consultrio do seu mdico, ligue para Par[employer] a Passenger transport manager. Se aplica as injees por conta prpria e perdeu uma dose, tome-a assim que possvel. Se j estiver quase na hora da sua prxima dose, tome somente essa dose. No tome o remdio em dobro, nem tome uma dose adicional. O que pode interagir com este medicamento? -colchicina -consumo pesado de lcool Esta lista pode no descrever todas as interaes possveis. D ao seu profissional de sade uma lista de todos os medicamentos, ervas medicinais, remdios de venda livre, ou suplementos alimentares que voc [place]. Diga tambm se voc fuma, bebe, ou [place] drogas ilcitas. Alguns destes podem interagir com o seu medicamento. Ao que devo ficar atento quando estiver [employer] medicamento? Consulte seu mdico ou profissional de sade para acompanhamento regular Museum/gallery curator. Voc precisar fazer exames de sangue peridicos enquanto estiver [employer]. Voc pode precisar seguir uma dieta especial. Fale com seu mdico. Para conseguir o mximo benefcio deste medicamento, limite o seu consumo de lcool e evite fumar. Que efeitos colaterais posso sentir aps usar este medicamento? Efeitos colaterais que devem ser informados ao seu mdico ou profissional de sade o mais rpido possvel: -reaes alrgicas, como erupo na pele, coceira, urticria, ou inchao do rosto, dos lbios ou da lngua -pele azulada -dor ou aperto no peito -chiado no peito ou dificuldade para respirar -tontura -rea vermelha, inchada e dolorosa na perna Efeitos colaterais que normalmente no precisam de cuidados mdicos (avise ao seu mdico ou profissional de sade se persistirem ou forem incmodos): -diarreia -dor de cabea Esta lista pode no descrever todos os efeitos colaterais possveis. Para mais orientaes sobre efeitos colaterais,  consulte o seu mdico. Voc pode relatar a ocorrncia de efeitos colaterais  FDA pelo telefone [phone]. Onde devo guardar meu medicamento? [facility] do [employer]. Conservar em temperatura ambiente, entre 15 e 30 degreesC [phone] e 86 degreesF). Proteger Administrator, arts. Descartar qualquer medicamento no utilizado aps a data de validade impressa no rtulo ou embalagem. OBSERVAO: Este folheto  um resumo. Pode no cobrir todas as informaes possveis. Se tiver dvidas a respeito deste medicamento, fale com seu mdico, farmacutico ou profissional de sade.    2016, Elsevier/Gold Standard. (2010-01-19 00:00:00)

## 2015-03-18 NOTE — Progress Notes (Signed)
Hematology and Oncology Follow Up Visit  Kerri Vasquez [id number] [birth year] 59 y.o. 03/18/2015   Principle Diagnosis:  1. Metastatic breast cancer, progressive liver metastases  2. Gastric bypass for subsequent B12 and iron deficiency 3. Osteoarthritis with chronic pain 4. 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 Faslodex 500 mg IM monthly 2. Afinitor 5 mg by mouth daily 2. Vitamin B12 1 mg IM monthly 3. IV iron as indicated    Interim History:  Kerri Vasquez is here today with her daughter for a follow-up. She is feeling fatigued. Her PET scan earlier this month showed mild progression of hepatic metastasis. She states that she takes her Afinitor daily as prescribed. She has had a total of 27 doses of Faslodex since 2013.  In October her CA 27.29 was 52. We rechecked this today and results are pending.  She states that her "bone" pain and aches are the same. She has no numbness or tingling in her extremities.  She has no appetite and has not been eating well. She states that she becomes nauseated at times when she eats but has not vomited. She is drinking fluids. Her weight is down 6 lbs since her last visit.  No fever, chills, cough, rash, dizziness, chest pain, palpitations, abdominal pain or changes in bowel or bladder habits.  She has some SOB with exertion. This is unchanged. She has some edema in her right arm and leg. She states that this is "normal" for her and has been like that for a long time. We will have her try a compression sleeve and stocking and see if this helps.   Medications:    Medication List       This list is accurate as of: 03/18/15 10:31 AM.  Always use your most recent med list.               carvedilol 6.25 MG tablet  Commonly known as:  COREG  Take 6.25 mg by mouth 2 (two) times daily with a meal.     everolimus 5 MG tablet  Commonly known as:  AFINITOR  Take 1 tablet (5 mg total) by mouth daily.     FLUoxetine 40 MG capsule  Commonly known as:  PROZAC  Take 40 mg by mouth daily before breakfast.     fulvestrant 250 MG/5ML injection  Commonly known as:  FASLODEX  Inject 250 mg into the muscle every 30 (thirty) days.     furosemide 20 MG tablet  Commonly known as:  LASIX  Take 60 mg by mouth daily.     levothyroxine 200 MCG tablet  Commonly known as:  SYNTHROID, LEVOTHROID  Take 200 mcg by mouth daily.     LORazepam 0.5 MG tablet  Commonly known as:  ATIVAN  PLACE 1 TABLET BY MOUTH UNDER THE TONGUE EVERY 6 HOURS AS NEEDED FOR NAUSEA     methocarbamol 750 MG tablet  Commonly known as:  ROBAXIN  Take 750 mg by mouth 4 (four) times daily.     MULTI-VITAMINS Tabs  Take 1 tablet by mouth daily.     nitroGLYCERIN 0.4 MG SL tablet  Commonly known as:  NITROSTAT  Place 1 tablet (0.4 mg total) under the tongue every 5 (five) minutes as needed for chest pain.     ondansetron 8 MG tablet  Commonly known as:  ZOFRAN  Take 1 tablet (8 mg total) by mouth every 8 (eight) hours as needed for nausea or  vomiting.     oxycodone 5 MG capsule  Commonly known as:  OXY-IR  Take 15 mg by mouth every 4 (four) hours.     oxyCODONE-acetaminophen 10-325 MG tablet  Commonly known as:  PERCOCET  Take 1 tablet by mouth every 5 (five) hours as needed (4 x per day). 7-25- "INCREASED BY PAIN DOCTOR"     oxymorphone 15 MG 12 hr tablet  Commonly known as:  OPANA ER  Take 15 mg by mouth every 12 (twelve) hours.     potassium chloride SA 20 MEQ tablet  Commonly known as:  KLOR-CON M20  Take 2 tablets (40 mEq total) by mouth 2 (two) times daily.     pregabalin 200 MG capsule  Commonly known as:  LYRICA  Take 200 mg by mouth 3 (three) times daily.     promethazine 25 MG tablet  Commonly known as:  PHENERGAN  TAKE 1 TABLET BY MOUTH THREE TIMES DAILY AS NEEDED FOR NAUSEA     spironolactone 25 MG tablet  Commonly known as:  ALDACTONE  Take 1 tablet (25 mg total) by mouth daily.     temazepam  15 MG capsule  Commonly known as:  RESTORIL  Take 30 mg by mouth at bedtime.     tiZANidine 4 MG tablet  Commonly known as:  ZANAFLEX  Take 4 mg by mouth every 6 (six) hours as needed for muscle spasms.        Allergies:  Allergies  Allergen Reactions  . Influenza Vac Split [Flu Virus Vaccine] Other (See Comments)    Joint stiffness and renal failure  . Ritalin [Methylphenidate Hcl]     "Heart attack"  . Zostavax [Zoster Vaccine Live ([employer])] Other (See Comments)    Joint stiffness, renal failure  . Adhesive [Tape] Itching and Rash    Past Medical History, Surgical history, Social history, and Family History were reviewed and updated.  Review of Systems: All other 10 point review of systems is negative.   Physical Exam:  height is '5\' 6"'  (1.676 m) and weight is 218 lb (98.884 kg). Her oral temperature is 98 F (36.7 C). Her blood pressure is 117/68 and her pulse is 73. Her respiration is 16.   Wt Readings from Last 3 Encounters:  03/18/15 218 lb (98.884 kg)  02/12/15 224 lb (101.606 kg)  01/21/15 226 lb (102.513 kg)    Ocular: Sclerae unicteric, pupils equal, round and reactive to light Ear-nose-throat: Oropharynx clear, dentition fair Lymphatic: No cervical supraclavicular or axillary adenopathy Lungs no rales or rhonchi, good excursion bilaterally Heart regular rate and rhythm, no murmur appreciated Abd soft, nontender, positive bowel sounds MSK no focal spinal tenderness, no joint edema Neuro: non-focal, well-oriented, appropriate affect Breasts: Has had bilateral mastectomies. No new mass, lesion, rash or lymphadenopathy.   Lab Results  Component Value Date   WBC 5.4 03/18/2015   HGB 11.6 03/18/2015   HCT 35.6 03/18/2015   MCV 89 03/18/2015   PLT 133* 03/18/2015   Lab Results  Component Value Date   FERRITIN 869* 02/12/2015   IRON 69 02/12/2015   TIBC 193* 02/12/2015   UIBC 123 02/12/2015   IRONPCTSAT 36 02/12/2015   Lab Results  Component Value  Date   RETICCTPCT 1.0 06/29/2014   RBC 3.98 03/18/2015   RETICCTABS 32.3 06/29/2014   No results found for: KPAFRELGTCHN, LAMBDASER, KAPLAMBRATIO No results found for: Kandis Cocking, IGMSERUM Lab Results  Component Value Date   TOTALPROTELP 4.7* 01/31/2010   ALBUMINELP 39.4*  01/31/2010   A1GS 12.1* 01/31/2010   A2GS 20.3* 01/31/2010   BETS 6.0 01/31/2010   BETA2SER 6.8* 01/31/2010   GAMS 15.4 01/31/2010   MSPIKE NOT DETECTED 01/31/2010   SPEI  01/31/2010    (NOTE) The possibility of a faint restricted band(s) cannot be completely excluded in the gamma region.  Suggest serum IFE to evaluate possibility, if clinically indicated. (Lab will hold sample one week. Please call Customer Service at [phone] to  add test.) Reviewed by Odis Hollingshead, MD, PhD, FCAP (Electronic Signature on File)     Chemistry      Component Value Date/Time   NA 144 02/12/2015 1345   NA 142 12/31/2014 1007   NA 142 10/05/2014 1052   K 5.3* 02/12/2015 1345   K 4.0 12/31/2014 1007   K 4.5 10/05/2014 1052   CL 102 12/31/2014 1007   CL 109 10/05/2014 1052   CO2 26 02/12/2015 1345   CO2 27 12/31/2014 1007   CO2 24 10/05/2014 1052   BUN 23.3 02/12/2015 1345   BUN 26* 12/31/2014 1007   BUN 33* 10/05/2014 1052   CREATININE 1.2* 02/12/2015 1345   CREATININE 1.1 12/31/2014 1007   CREATININE 1.47* 10/05/2014 1052      Component Value Date/Time   CALCIUM 9.1 02/12/2015 1345   CALCIUM 8.5 12/31/2014 1007   CALCIUM 8.2* 10/05/2014 1052   ALKPHOS 76 02/12/2015 1345   ALKPHOS 92* 12/31/2014 1007   ALKPHOS 96 10/05/2014 1052   AST 19 02/12/2015 1345   AST 32 12/31/2014 1007   AST 13 10/05/2014 1052   ALT 12 02/12/2015 1345   ALT 36 12/31/2014 1007   ALT 10 10/05/2014 1052   BILITOT 0.30 02/12/2015 1345   BILITOT 0.60 12/31/2014 1007   BILITOT 0.6 10/05/2014 1052     Impression and Plan: Impression and Plan: Kerri Vasquez is 59 year old female with metastatic breast cancer with mets to the  liver. Pet scan this month showed mild progression of lever metastasis.  We will plan to biopsy her liver to assess her ER, PR and HER-2 status.  We will plan to see her back in 3 weeks for follow-up and to discuss her scans and treatment plan.  She will start doing B 12 1,000 mcg injections at home every 3 weeks. Prescription sent to pharmacy.  Also refilled her ativan and phenergan scripts.  Gave her scripts for compression sleeves and stockings to help with lymphedema.  She has c/o broken teeth and would like a referral to a dentist. I have referred her to dr. Enrique Sack.  Both she and her daughter know to contact us with any questions or concerns. We can certainly see her sooner if need be.   Eliezer Bottom, NP 11/17/201610:31 AM

## 2015-03-18 NOTE — Progress Notes (Signed)
Sarah notified of no change in pain after Dilaudid, orders received for additional Dilaudid 1 mg.

## 2015-03-19 LAB — CANCER ANTIGEN 27.29: CA 27.29: 68 U/mL — AB (ref 0–39)

## 2015-03-26 ENCOUNTER — Other Ambulatory Visit: Payer: Self-pay | Admitting: Radiology

## 2015-03-29 ENCOUNTER — Other Ambulatory Visit: Payer: Self-pay | Admitting: Radiology

## 2015-03-30 ENCOUNTER — Ambulatory Visit (HOSPITAL_COMMUNITY): Admission: RE | Admit: 2015-03-30 | Payer: Medicare Other | Source: Ambulatory Visit

## 2015-04-01 DIAGNOSIS — R112 Nausea with vomiting, unspecified: Secondary | ICD-10-CM

## 2015-04-01 HISTORY — PX: LIVER BIOPSY: SHX301

## 2015-04-01 HISTORY — DX: Nausea with vomiting, unspecified: R11.2

## 2015-04-05 ENCOUNTER — Other Ambulatory Visit: Payer: Self-pay | Admitting: Radiology

## 2015-04-07 ENCOUNTER — Other Ambulatory Visit: Payer: Self-pay | Admitting: Radiology

## 2015-04-08 ENCOUNTER — Ambulatory Visit (HOSPITAL_COMMUNITY)
Admission: RE | Admit: 2015-04-08 | Discharge: 2015-04-08 | Disposition: A | Discharge status: Home / Self Care | Payer: Medicare Other | Source: Ambulatory Visit | Attending: Family | Admitting: Family

## 2015-04-08 DIAGNOSIS — E538 Deficiency of other specified B group vitamins: Secondary | ICD-10-CM

## 2015-04-08 DIAGNOSIS — K219 Gastro-esophageal reflux disease without esophagitis: Secondary | ICD-10-CM | POA: Insufficient documentation

## 2015-04-08 DIAGNOSIS — E119 Type 2 diabetes mellitus without complications: Secondary | ICD-10-CM | POA: Diagnosis not present

## 2015-04-08 DIAGNOSIS — K769 Liver disease, unspecified: Secondary | ICD-10-CM | POA: Diagnosis not present

## 2015-04-08 DIAGNOSIS — I10 Essential (primary) hypertension: Secondary | ICD-10-CM | POA: Diagnosis not present

## 2015-04-08 DIAGNOSIS — E039 Hypothyroidism, unspecified: Secondary | ICD-10-CM | POA: Diagnosis not present

## 2015-04-08 DIAGNOSIS — C787 Secondary malignant neoplasm of liver and intrahepatic bile duct: Secondary | ICD-10-CM

## 2015-04-08 DIAGNOSIS — I252 Old myocardial infarction: Secondary | ICD-10-CM | POA: Insufficient documentation

## 2015-04-08 DIAGNOSIS — C50919 Malignant neoplasm of unspecified site of unspecified female breast: Secondary | ICD-10-CM | POA: Insufficient documentation

## 2015-04-08 DIAGNOSIS — M159 Polyosteoarthritis, unspecified: Secondary | ICD-10-CM | POA: Diagnosis not present

## 2015-04-08 DIAGNOSIS — I890 Lymphedema, not elsewhere classified: Secondary | ICD-10-CM

## 2015-04-08 DIAGNOSIS — S025XXA Fracture of tooth (traumatic), initial encounter for closed fracture: Secondary | ICD-10-CM

## 2015-04-08 DIAGNOSIS — Z79899 Other long term (current) drug therapy: Secondary | ICD-10-CM | POA: Insufficient documentation

## 2015-04-08 DIAGNOSIS — R160 Hepatomegaly, not elsewhere classified: Secondary | ICD-10-CM | POA: Insufficient documentation

## 2015-04-08 DIAGNOSIS — C801 Malignant (primary) neoplasm, unspecified: Secondary | ICD-10-CM | POA: Diagnosis not present

## 2015-04-08 LAB — CBC
HCT: 29.9 % — ABNORMAL LOW (ref 36.0–46.0)
HEMOGLOBIN: 9.8 g/dL — AB (ref 12.0–15.0)
MCH: 29.3 pg (ref 26.0–34.0)
MCHC: 32.8 g/dL (ref 30.0–36.0)
MCV: 89.3 fL (ref 78.0–100.0)
PLATELETS: 67 10*3/uL — AB (ref 150–400)
RBC: 3.35 MIL/uL — AB (ref 3.87–5.11)
RDW: 13.2 % (ref 11.5–15.5)
WBC: 3.7 10*3/uL — ABNORMAL LOW (ref 4.0–10.5)

## 2015-04-08 LAB — PROTIME-INR
INR: 1.17 (ref 0.00–1.49)
PROTHROMBIN TIME: 15.1 s (ref 11.6–15.2)

## 2015-04-08 LAB — APTT: APTT: 34 s (ref 24–37)

## 2015-04-08 MED ORDER — MIDAZOLAM HCL 2 MG/2ML IJ SOLN
INTRAMUSCULAR | Status: AC | PRN
  Administered 2015-04-08: 1 mg via INTRAVENOUS
  Administered 2015-04-08 (×2): 0.5 mg via INTRAVENOUS

## 2015-04-08 MED ORDER — OXYCODONE-ACETAMINOPHEN 5-325 MG PO TABS
2.0000 | ORAL_TABLET | Freq: Once | ORAL | Status: AC
  Administered 2015-04-08: 2 via ORAL

## 2015-04-08 MED ORDER — GELATIN ABSORBABLE 12-7 MM EX MISC
CUTANEOUS | Status: AC
  Filled 2015-04-08: qty 1

## 2015-04-08 MED ORDER — MIDAZOLAM HCL 2 MG/2ML IJ SOLN
INTRAMUSCULAR | Status: AC
  Filled 2015-04-08: qty 2

## 2015-04-08 MED ORDER — FENTANYL CITRATE (PF) 100 MCG/2ML IJ SOLN
INTRAMUSCULAR | Status: AC
  Filled 2015-04-08: qty 2

## 2015-04-08 MED ORDER — FENTANYL CITRATE (PF) 100 MCG/2ML IJ SOLN
INTRAMUSCULAR | Status: AC | PRN
  Administered 2015-04-08: 50 ug via INTRAVENOUS
  Administered 2015-04-08: 25 ug via INTRAVENOUS

## 2015-04-08 MED ORDER — OXYCODONE-ACETAMINOPHEN 5-325 MG PO TABS
ORAL_TABLET | ORAL | Status: AC
  Filled 2015-04-08: qty 2

## 2015-04-08 MED ORDER — LIDOCAINE HCL (PF) 1 % IJ SOLN
INTRAMUSCULAR | Status: DC
Start: 2015-04-08 — End: 2015-04-09
  Filled 2015-04-08: qty 10

## 2015-04-08 MED ORDER — SODIUM CHLORIDE 0.9 % IV SOLN
Freq: Once | INTRAVENOUS | Status: AC
  Administered 2015-04-08: 13:00:00 via INTRAVENOUS

## 2015-04-08 NOTE — Sedation Documentation (Signed)
Patient is resting comfortably. 

## 2015-04-08 NOTE — Discharge Instructions (Signed)

## 2015-04-08 NOTE — H&P (Signed)
Chief Complaint: Liver lesion History of breast cancer  Referring Physician(s): [place],Sarah M  History of Present Illness: Kerri Vasquez is a 59 y.o. female with metastatic breast cancer.    She has had bilateral mastectomies and is undergoing chemotherapy.  Recent PET shows mild progression of her liver mets= A hepatic dome lesion (labeled as segment 4A on the prior exam secondary to anatomic distortion) measures 12 mm and a S.U.V. max of 5.3 (image 84, series 4). 9 mm and a S.U.V. max of 5.0 on the prior exam.  Lesions along the falciform ligament, in the medial segment left liver lobe, measure in combination 2.6 x 2.1 cm and a S.U.V. max of 8.5. On the prior exam, these measured maximally 2.2 cm and a S.U.V. max of 7.4.  We are asked to perform an US guided biopsy of a liver lesion today.  She only complains of "bone pain" and states it is mostly in her back and legs.  She denies any fever or chills or recent illness.  She is not taking blood thinners and is NPO  Past Medical History  Diagnosis Date  . Depression   . Hypertension   . Hypothyroidism   . Incisional hernia   . Spinal stenosis   . Scoliosis   . Sciatica   . Intestinal obstruction ([place])   . GERD (gastroesophageal reflux disease)   . Septic arthritis ([place]) 01/28/10    and spinal stenosis , moderate scoliosis   . Osteoarthritis of multiple joints   . Sleep apnea     hx of off machine x 4 years   . Renal insufficiency     hx renal failure 2011  . Fibromyalgia   . Fracture of multiple toes     right toes x 4 toes- excluding small toe  . Diabetes mellitus     type II, controlled by diet 05/25/11   . CHF (congestive heart failure) ([place])   . Anemia   . Pernicious anemia 05/30/2011    Iron transfusion and VB12 on 06/06/11  . Migraine     "weekly; but they don't last long" (08/26/2014)  . Chronic lower back pain   . Breast cancer, right breast ([place])   . Liver cancer ([place])     "twice"  .  Thyroid cancer ([place])   . Myocardial infarction [facility]) 06/2014    S/P percutaneous intervention procedure a metastatic lesion in the liver    Past Surgical History  Procedure Laterality Date  . Tonsillectomy  1969  . Multiple extractions with alveoloplasty  06/08/2011    Procedure: MULTIPLE EXTRACION WITH ALVEOLOPLASTY;  Surgeon: Lenn Cal, DDS;  Location: [facility] ORS;  Service: Oral Surgery;  Laterality: N/A;  Extraction of tooth #'s 3,4,7 with alveoloplasty and Gross Debridement of Remaining Teeth  . Gastric bypass  2006    "w/scope"  . Colectomy  2011    "SBO"  . Radiofrequency ablation liver tumor  2011; 01/26/2012  . Thyroid lobectomy  02/16/2012    Procedure: THYROID LOBECTOMY;  Surgeon: Earnstine Regal, MD;  Location: [facility];  Service: General;  Laterality: Right;  . Left heart catheterization with coronary angiogram N/A 07/14/2014    Procedure: LEFT HEART CATHETERIZATION WITH CORONARY ANGIOGRAM;  Surgeon: Leonie Man, MD;  Location: [facility] CATH LAB;  Service: Cardiovascular;  Laterality: N/A;  . Cholecystectomy open  06/29/85  . Breast implant removal Left 2015  . Mastectomy Bilateral 2008  . Breast biopsy Right 2008  . Breast reconstruction  Bilateral 2008; 2009    "w/the mastectomy; "  . Breast reconstruction Left ~ 2010    "it capsized"  . Cardiac catheterization      Allergies: Influenza vac split; Ritalin; Zostavax; and Adhesive  Medications: Prior to Admission medications   Medication Sig Start Date End Date Taking? Authorizing Provider  carvedilol (COREG) 6.25 MG tablet Take 6.25 mg by mouth 2 (two) times daily with a meal.   Yes Historical Provider, MD  everolimus (AFINITOR) 5 MG tablet Take 1 tablet (5 mg total) by mouth daily. 02/12/15  Yes Volanda Napoleon, MD  FLUoxetine (PROZAC) 40 MG capsule Take 40 mg by mouth daily before breakfast.    Yes Historical Provider, MD  furosemide (LASIX) 20 MG tablet Take 60 mg by mouth daily.    Yes Historical Provider, MD    levothyroxine (SYNTHROID, LEVOTHROID) 200 MCG tablet Take 200 mcg by mouth daily. 12/15/14  Yes Historical Provider, MD  LORazepam (ATIVAN) 0.5 MG tablet PLACE 1 TABLET BY MOUTH UNDER THE TONGUE EVERY 6 HOURS AS NEEDED FOR NAUSEA Patient taking differently: Take 1 mg by mouth every 6 (six) hours as needed for anxiety. PLACE 1 TABLET BY MOUTH UNDER THE TONGUE EVERY 6 HOURS AS NEEDED FOR NAUSEA 03/18/15  Yes [place], NP  methocarbamol (ROBAXIN) 750 MG tablet Take 750 mg by mouth 4 (four) times daily.   Yes Historical Provider, MD  Multiple Vitamin (MULTI-VITAMINS) TABS Take 1 tablet by mouth daily. 11/29/11  Yes Historical Provider, MD  ondansetron (ZOFRAN) 8 MG tablet Take 1 tablet (8 mg total) by mouth every 8 (eight) hours as needed for nausea or vomiting. 07/20/14  Yes Volanda Napoleon, MD  oxycodone (OXY-IR) 5 MG capsule Take 15 mg by mouth every 4 (four) hours.   Yes Historical Provider, MD  oxyCODONE-acetaminophen (PERCOCET) 10-325 MG per tablet Take 1 tablet by mouth every 5 (five) hours as needed (4 x per day). 7-25- "INCREASED BY PAIN DOCTOR"   Yes Historical Provider, MD  oxymorphone (OPANA ER) 15 MG 12 hr tablet Take 15 mg by mouth every 12 (twelve) hours.   Yes Historical Provider, MD  potassium chloride SA (KLOR-CON M20) 20 MEQ tablet Take 2 tablets (40 mEq total) by mouth 2 (two) times daily. Patient taking differently: Take 40 mEq by mouth daily.  09/04/14  Yes Larey Dresser, MD  pregabalin (LYRICA) 200 MG capsule Take 200 mg by mouth 3 (three) times daily.   Yes Historical Provider, MD  promethazine (PHENERGAN) 25 MG tablet TAKE 1 TABLET BY MOUTH THREE TIMES DAILY AS NEEDED FOR NAUSEA 03/18/15  Yes Eliezer Bottom, NP  spironolactone (ALDACTONE) 25 MG tablet Take 1 tablet (25 mg total) by mouth daily. 03/15/15  Yes Shaune Pascal Bensimhon, MD  temazepam (RESTORIL) 15 MG capsule Take 30 mg by mouth at bedtime.   Yes Historical Provider, MD  tiZANidine (ZANAFLEX) 4 MG tablet Take 4  mg by mouth every 6 (six) hours as needed for muscle spasms.   Yes Historical Provider, MD  cyanocobalamin (,VITAMIN B-12,) 1000 MCG/ML injection INJECT 1 ML AS DIRECTED EVERY 21 DAYS 03/18/15   Eliezer Bottom, NP  fulvestrant (FASLODEX) 250 MG/5ML injection Inject 250 mg into the muscle every 30 (thirty) days. 02/26/12   Historical Provider, MD  nitroGLYCERIN (NITROSTAT) 0.4 MG SL tablet Place 1 tablet (0.4 mg total) under the tongue every 5 (five) minutes as needed for chest pain. 07/18/14   Annita Brod, MD  Family History  Problem Relation Age of Onset  . Cancer Mother     breast  . Hypertension Mother   . Anesthesia problems Daughter     Social History   Social History  . Marital Status: Divorced    Spouse Name: N/A  . Number of Children: 1  . Years of Education: N/A   Social History Main Topics  . Smoking status: Never Smoker   . Smokeless tobacco: Never Used     Comment: NEVER USED TOBACCO  . Alcohol Use: No  . Drug Use: No  . Sexual Activity: Not Currently   Other Topics Concern  . Not on file   Social History Narrative   Caffeine use: 3 cups coffee/tea daily   Regular exercise: no          Review of Systems  Constitutional: Positive for fatigue. Negative for fever, chills, activity change and appetite change.  HENT: Negative.   Respiratory: Negative for cough, chest tightness, shortness of breath and wheezing.   Gastrointestinal: Negative for nausea, vomiting, abdominal pain and abdominal distention.  Genitourinary: Negative.   Musculoskeletal: Positive for back pain and arthralgias.  Skin: Negative.   Neurological: Negative.   Psychiatric/Behavioral: Negative.     Vital Signs: BP 123/67 mmHg  Pulse 62  Temp(Src) 97.8 F (36.6 C) (Oral)  Resp 18  Ht 5\' 6"  (1.676 m)  Wt 218 lb (98.884 kg)  BMI 35.20 kg/m2  SpO2 100%  LMP 08/30/2006  Physical Exam  Constitutional: She is oriented to person, place, and time.  Obese, NAD  HENT:    Head: Normocephalic and atraumatic.  Eyes: EOM are normal.  Neck: Normal range of motion. Neck supple.  Cardiovascular: Normal rate, regular rhythm and normal heart sounds.   Abdominal: Soft. Bowel sounds are normal. She exhibits no distension. There is no tenderness.  Musculoskeletal: Normal range of motion.  Neurological: She is alert and oriented to person, place, and time.  Skin: Skin is warm and dry.  Psychiatric: She has a normal mood and affect. Her behavior is normal. Judgment and thought content normal.  Vitals reviewed.   Mallampati Score:  MD Evaluation Airway: WNL Heart: WNL Abdomen: WNL Chest/ Lungs: WNL ASA  Classification: 3 Mallampati/Airway Score: Two  Imaging: No results found.  Labs:  CBC:  Recent Labs  12/24/14 1206 02/12/15 1343 03/18/15 0917 04/08/15 1200  WBC 4.9 6.2 5.4 3.7*  HGB 10.2* 10.9* 11.6 9.8*  HCT 32.7* 34.7* 35.6 29.9*  PLT 131* 112* 133* PENDING    COAGS:  Recent Labs  07/07/14 1005 07/11/14 1842 07/12/14 0705 08/27/14 1210 04/08/15 1200  INR 1.11 1.21 1.20 1.20 1.17  APTT 36 29  --  35 34    BMP:  Recent Labs  08/27/14 0410 08/28/14 0336  09/04/14 1207 09/14/14 1230 10/05/14 1052 11/23/14 1403 12/24/14 1207 12/31/14 1007 02/12/15 1345 03/18/15 0918  NA 142 142  < > 138 142 142 142 141 142 144 141  K 4.4 4.3  < > 4.6 4.7 4.5 3.8 4.4 4.0 5.3* 3.8  CL 113* 114*  < > 112* 113* 109 112* 105 102  --   --   CO2 20 21  < > 19* 18* 24 18 27 27 26 26   GLUCOSE 79 92  < > 80 77 90 131* 140* 89 78 91  BUN 46* 38*  < > 27* 38* 33* 34* 26* 26* 23.3 12.5  CALCIUM 8.1* 7.8*  < > 7.9* 8.0* 8.2* 9.3 8.9 8.5  9.1 9.2  CREATININE 1.45* 1.33*  < > 1.27* 1.84* 1.47* 1.7* 1.5* 1.1 1.2* 1.2*  GFRNONAA 39* 43*  --  45* 29*  --   --   --   --   --   --   GFRAA 45* 50*  --  52* 33*  --   --   --   --   --   --   < > = values in this interval not displayed.  LIVER FUNCTION TESTS:  Recent Labs  12/24/14 1207 12/31/14 1007  02/12/15 1345 03/18/15 0918  BILITOT 0.60 0.60 0.30 0.57  AST 266* 32 19 21  ALT 99* 36 12 11  ALKPHOS 136* 92* 76 74  PROT 6.9 7.1 7.2 7.4  ALBUMIN 3.2* 3.4 3.3* 3.6    TUMOR MARKERS: No results for input(s): AFPTM, CEA, CA199, CHROMGRNA in the last 8760 hours.  Imaging:  CLINICAL DATA: Subsequent treatment strategy for restaging of right breast cancer. Liver metastasis. History of thyroid cancer.  EXAM: NUCLEAR MEDICINE PET SKULL BASE TO THIGH  TECHNIQUE: 11.1 mCi F-18 FDG was injected intravenously. Full-ring PET imaging was performed from the skull base to thigh after the radiotracer. CT data was obtained and used for attenuation correction and anatomic localization.  FASTING BLOOD GLUCOSE: Value: 75 mg/dl  COMPARISON: 09/25/2014  FINDINGS: Mild degradation secondary to patient body habitus.  NECK  No areas of abnormal hypermetabolism.  CHEST  No areas of abnormal hypermetabolism. Previously described right cardiophrenic angle nodal hypermetabolism is resolved.  ABDOMEN/PELVIS  The subcapsular right hepatic fluid collection is resolved. This resolves the anatomic distortion on the prior exam.  A hepatic dome lesion (labeled as segment 4A on the prior exam secondary to anatomic distortion) measures 12 mm and a S.U.V. max of 5.3 (image 84, series 4). 9 mm and a S.U.V. max of 5.0 on the prior exam.  Lesions along the falciform ligament, in the medial segment left liver lobe, measure in combination 2.6 x 2.1 cm and a S.U.V. max of 8.5. On the prior exam, these measured maximally 2.2 cm and a S.U.V. max of 7.4.  Apparent hypermetabolism along the fissure for the ligamentum venosum, favored to be artifactual and related to adjacent bowel.  Subcutaneous nodularity deep to the umbilicus measures 1.6 cm and a S.U.V. max of 3.6 (image 145, series 4). This is present on the prior exam but newly hypermetabolic today.  Again demonstrated is  ascending colonic hypermetabolism, without well-defined CT abnormality. This measures a S.U.V. max of 9.4.  SKELETON  No abnormal marrow activity.  CT IMAGES PERFORMED FOR ATTENUATION CORRECTION  No cervical adenopathy. Right-sided thyroidectomy. Right breast implant. Resolved right sided pleural effusion. Surgical changes about the stomach. Right paracentral small bowel containing ventral abdominal wall hernia is chronic.  IMPRESSION: 1. Mild progression of hepatic metastasis. 2. Anterior abdominal wall nodularity is similar with minimal new hypermetabolism. This could be reactive or less likely represent an unusual appearance of subcutaneous metastasis. 3. Previously described right cardiophrenic angle hypermetabolism is resolved. 4. Right perihepatic fluid collection and right pleural effusion are also resolved. 5. Decreased sensitivity and specificity exam due to technique related factors, as described above. 6. Ascending colonic hypermetabolism is favored to be physiologic.   Electronically Signed  By: Abigail Miyamoto M.D.  On: 03/09/2015 12:03  Assessment and Plan:  Metastatic breast cancer.   Liver lesions.  Will proceed with US guided biopsy of liver lesion today by Dr. Earleen Newport  Thank you for this interesting consult.  I greatly enjoyed meeting Kerri Vasquez and look forward to participating in their care.  A copy of this report was sent to the requesting provider on this date.  Signed: Murrell Redden PA-C 04/08/2015, 12:45 PM   I spent a total of  30 Minutes in face to face in clinical consultation, greater than 50% of which was counseling/coordinating care for US guided biopsy of liver lesion.

## 2015-04-08 NOTE — Sedation Documentation (Signed)
Pt resting quietly but asking for more medication to make her sleepy.

## 2015-04-08 NOTE — Procedures (Signed)
Interventional Radiology Procedure Note  Procedure: US guided bx of right liver lobe lesion.  FDG avid.  Complications: None Recommendations:  - Ok to shower tomorrow - Do not submerge for 7 days - Routine care   Signed,  Dulcy Fanny. Earleen Newport, DO

## 2015-04-12 ENCOUNTER — Other Ambulatory Visit: Payer: Self-pay

## 2015-04-12 ENCOUNTER — Ambulatory Visit: Payer: Self-pay | Admitting: Family

## 2015-04-14 ENCOUNTER — Other Ambulatory Visit: Payer: Self-pay | Admitting: Family

## 2015-04-18 ENCOUNTER — Encounter (HOSPITAL_COMMUNITY): Payer: Self-pay | Admitting: Emergency Medicine

## 2015-04-18 DIAGNOSIS — I252 Old myocardial infarction: Secondary | ICD-10-CM | POA: Insufficient documentation

## 2015-04-18 DIAGNOSIS — R0789 Other chest pain: Secondary | ICD-10-CM | POA: Diagnosis not present

## 2015-04-18 DIAGNOSIS — K529 Noninfective gastroenteritis and colitis, unspecified: Secondary | ICD-10-CM | POA: Diagnosis not present

## 2015-04-18 DIAGNOSIS — Z85850 Personal history of malignant neoplasm of thyroid: Secondary | ICD-10-CM | POA: Insufficient documentation

## 2015-04-18 DIAGNOSIS — Z66 Do not resuscitate: Secondary | ICD-10-CM | POA: Insufficient documentation

## 2015-04-18 DIAGNOSIS — K5900 Constipation, unspecified: Secondary | ICD-10-CM | POA: Diagnosis not present

## 2015-04-18 DIAGNOSIS — I5181 Takotsubo syndrome: Secondary | ICD-10-CM | POA: Diagnosis not present

## 2015-04-18 DIAGNOSIS — C50919 Malignant neoplasm of unspecified site of unspecified female breast: Secondary | ICD-10-CM | POA: Diagnosis not present

## 2015-04-18 DIAGNOSIS — G894 Chronic pain syndrome: Secondary | ICD-10-CM | POA: Insufficient documentation

## 2015-04-18 DIAGNOSIS — E119 Type 2 diabetes mellitus without complications: Secondary | ICD-10-CM | POA: Diagnosis not present

## 2015-04-18 DIAGNOSIS — C7951 Secondary malignant neoplasm of bone: Secondary | ICD-10-CM | POA: Insufficient documentation

## 2015-04-18 DIAGNOSIS — N182 Chronic kidney disease, stage 2 (mild): Secondary | ICD-10-CM | POA: Diagnosis not present

## 2015-04-18 DIAGNOSIS — C787 Secondary malignant neoplasm of liver and intrahepatic bile duct: Secondary | ICD-10-CM | POA: Insufficient documentation

## 2015-04-18 DIAGNOSIS — R112 Nausea with vomiting, unspecified: Secondary | ICD-10-CM | POA: Diagnosis present

## 2015-04-18 DIAGNOSIS — M549 Dorsalgia, unspecified: Secondary | ICD-10-CM | POA: Insufficient documentation

## 2015-04-18 DIAGNOSIS — N179 Acute kidney failure, unspecified: Secondary | ICD-10-CM | POA: Insufficient documentation

## 2015-04-18 DIAGNOSIS — I5032 Chronic diastolic (congestive) heart failure: Secondary | ICD-10-CM | POA: Diagnosis not present

## 2015-04-18 DIAGNOSIS — E039 Hypothyroidism, unspecified: Secondary | ICD-10-CM | POA: Diagnosis not present

## 2015-04-18 DIAGNOSIS — I129 Hypertensive chronic kidney disease with stage 1 through stage 4 chronic kidney disease, or unspecified chronic kidney disease: Secondary | ICD-10-CM | POA: Diagnosis not present

## 2015-04-19 ENCOUNTER — Encounter (HOSPITAL_COMMUNITY): Payer: Self-pay | Admitting: Radiology

## 2015-04-19 ENCOUNTER — Observation Stay (HOSPITAL_COMMUNITY): Payer: Medicare Other

## 2015-04-19 ENCOUNTER — Emergency Department (HOSPITAL_COMMUNITY): Payer: Medicare Other

## 2015-04-19 ENCOUNTER — Encounter: Payer: Self-pay | Admitting: Hematology & Oncology

## 2015-04-19 ENCOUNTER — Observation Stay (HOSPITAL_COMMUNITY)
Admission: EM | Admit: 2015-04-19 | Discharge: 2015-04-21 | Disposition: A | Discharge status: Home / Self Care | Payer: Medicare Other | Attending: Internal Medicine | Admitting: Internal Medicine

## 2015-04-19 DIAGNOSIS — R52 Pain, unspecified: Secondary | ICD-10-CM

## 2015-04-19 DIAGNOSIS — R0789 Other chest pain: Secondary | ICD-10-CM | POA: Diagnosis not present

## 2015-04-19 DIAGNOSIS — G894 Chronic pain syndrome: Secondary | ICD-10-CM | POA: Diagnosis present

## 2015-04-19 DIAGNOSIS — C50919 Malignant neoplasm of unspecified site of unspecified female breast: Secondary | ICD-10-CM

## 2015-04-19 DIAGNOSIS — R1110 Vomiting, unspecified: Secondary | ICD-10-CM | POA: Diagnosis not present

## 2015-04-19 DIAGNOSIS — R112 Nausea with vomiting, unspecified: Secondary | ICD-10-CM | POA: Diagnosis present

## 2015-04-19 DIAGNOSIS — R079 Chest pain, unspecified: Secondary | ICD-10-CM | POA: Diagnosis not present

## 2015-04-19 DIAGNOSIS — C787 Secondary malignant neoplasm of liver and intrahepatic bile duct: Secondary | ICD-10-CM

## 2015-04-19 DIAGNOSIS — IMO0001 Reserved for inherently not codable concepts without codable children: Secondary | ICD-10-CM

## 2015-04-19 DIAGNOSIS — N189 Chronic kidney disease, unspecified: Secondary | ICD-10-CM

## 2015-04-19 DIAGNOSIS — K5900 Constipation, unspecified: Secondary | ICD-10-CM | POA: Diagnosis not present

## 2015-04-19 DIAGNOSIS — C7951 Secondary malignant neoplasm of bone: Secondary | ICD-10-CM | POA: Diagnosis not present

## 2015-04-19 DIAGNOSIS — R778 Other specified abnormalities of plasma proteins: Secondary | ICD-10-CM | POA: Diagnosis present

## 2015-04-19 DIAGNOSIS — C50912 Malignant neoplasm of unspecified site of left female breast: Secondary | ICD-10-CM

## 2015-04-19 DIAGNOSIS — K529 Noninfective gastroenteritis and colitis, unspecified: Secondary | ICD-10-CM | POA: Diagnosis not present

## 2015-04-19 DIAGNOSIS — M4806 Spinal stenosis, lumbar region: Secondary | ICD-10-CM | POA: Diagnosis not present

## 2015-04-19 DIAGNOSIS — N179 Acute kidney failure, unspecified: Secondary | ICD-10-CM | POA: Diagnosis present

## 2015-04-19 DIAGNOSIS — E119 Type 2 diabetes mellitus without complications: Secondary | ICD-10-CM

## 2015-04-19 DIAGNOSIS — I5033 Acute on chronic diastolic (congestive) heart failure: Secondary | ICD-10-CM | POA: Diagnosis present

## 2015-04-19 DIAGNOSIS — R7989 Other specified abnormal findings of blood chemistry: Secondary | ICD-10-CM

## 2015-04-19 DIAGNOSIS — M5124 Other intervertebral disc displacement, thoracic region: Secondary | ICD-10-CM | POA: Diagnosis not present

## 2015-04-19 DIAGNOSIS — I10 Essential (primary) hypertension: Secondary | ICD-10-CM | POA: Diagnosis not present

## 2015-04-19 DIAGNOSIS — E039 Hypothyroidism, unspecified: Secondary | ICD-10-CM

## 2015-04-19 HISTORY — DX: Nausea with vomiting, unspecified: R11.2

## 2015-04-19 LAB — BASIC METABOLIC PANEL
ANION GAP: 11 (ref 5–15)
BUN: 14 mg/dL (ref 6–20)
CHLORIDE: 111 mmol/L (ref 101–111)
CO2: 20 mmol/L — ABNORMAL LOW (ref 22–32)
Calcium: 9.2 mg/dL (ref 8.9–10.3)
Creatinine, Ser: 1.29 mg/dL — ABNORMAL HIGH (ref 0.44–1.00)
GFR calc Af Amer: 51 mL/min — ABNORMAL LOW (ref >60)
GFR, EST NON AFRICAN AMERICAN: 44 mL/min — AB (ref >60)
Glucose, Bld: 136 mg/dL — ABNORMAL HIGH (ref 65–99)
POTASSIUM: 4.5 mmol/L (ref 3.5–5.1)
SODIUM: 142 mmol/L (ref 135–145)

## 2015-04-19 LAB — TROPONIN I
TROPONIN I: 0.25 ng/mL — AB (ref <0.031)
Troponin I: 0.04 ng/mL — ABNORMAL HIGH (ref <0.031)

## 2015-04-19 LAB — I-STAT TROPONIN, ED: TROPONIN I, POC: 0.07 ng/mL (ref 0.00–0.08)

## 2015-04-19 LAB — MRSA PCR SCREENING: MRSA BY PCR: POSITIVE — AB

## 2015-04-19 LAB — CBC
HEMATOCRIT: 36.3 % (ref 36.0–46.0)
HEMOGLOBIN: 11.6 g/dL — AB (ref 12.0–15.0)
MCH: 28.5 pg (ref 26.0–34.0)
MCHC: 32 g/dL (ref 30.0–36.0)
MCV: 89.2 fL (ref 78.0–100.0)
PLATELETS: 97 10*3/uL — AB (ref 150–400)
RBC: 4.07 MIL/uL (ref 3.87–5.11)
RDW: 12.8 % (ref 11.5–15.5)
WBC: 7.8 10*3/uL (ref 4.0–10.5)

## 2015-04-19 LAB — INFLUENZA PANEL BY PCR (TYPE A & B)
H1N1FLUPCR: NOT DETECTED
INFLAPCR: NEGATIVE
INFLBPCR: NEGATIVE

## 2015-04-19 LAB — HEPATIC FUNCTION PANEL
ALBUMIN: 3.7 g/dL (ref 3.5–5.0)
ALT: 16 U/L (ref 14–54)
AST: 26 U/L (ref 15–41)
Alkaline Phosphatase: 93 U/L (ref 38–126)
Bilirubin, Direct: 0.3 mg/dL (ref 0.1–0.5)
TOTAL PROTEIN: 6.7 g/dL (ref 6.5–8.1)

## 2015-04-19 LAB — BILIRUBIN, TOTAL: Total Bilirubin: 0.8 mg/dL (ref 0.3–1.2)

## 2015-04-19 LAB — TSH: TSH: 0.02 u[IU]/mL — ABNORMAL LOW (ref 0.350–4.500)

## 2015-04-19 MED ORDER — ONDANSETRON HCL 4 MG/2ML IJ SOLN
4.0000 mg | Freq: Four times a day (QID) | INTRAMUSCULAR | Status: DC | PRN
  Administered 2015-04-19 – 2015-04-20 (×4): 8 mg via INTRAVENOUS
  Administered 2015-04-20: 4 mg via INTRAVENOUS
  Administered 2015-04-20: 8 mg via INTRAVENOUS
  Filled 2015-04-19: qty 4
  Filled 2015-04-19: qty 2
  Filled 2015-04-19 (×4): qty 4

## 2015-04-19 MED ORDER — GADOPENTETATE DIMEGLUMINE 469.01 MG/ML IV SOLN
20.0000 mL | Freq: Once | INTRAVENOUS | Status: AC | PRN
  Administered 2015-04-19: 20 mL via INTRAVENOUS

## 2015-04-19 MED ORDER — GI COCKTAIL ~~LOC~~
30.0000 mL | Freq: Once | ORAL | Status: DC
  Filled 2015-04-19: qty 30

## 2015-04-19 MED ORDER — FENTANYL 12 MCG/HR TD PT72
12.5000 ug | MEDICATED_PATCH | TRANSDERMAL | Status: DC
  Administered 2015-04-19: 12.5 ug via TRANSDERMAL
  Filled 2015-04-19: qty 1

## 2015-04-19 MED ORDER — SODIUM CHLORIDE 0.45 % IV SOLN
INTRAVENOUS | Status: AC
  Administered 2015-04-19 (×2): 1000 mL via INTRAVENOUS

## 2015-04-19 MED ORDER — HEPARIN SODIUM (PORCINE) 5000 UNIT/ML IJ SOLN
5000.0000 [IU] | Freq: Three times a day (TID) | INTRAMUSCULAR | Status: DC
  Administered 2015-04-19 – 2015-04-20 (×4): 5000 [IU] via SUBCUTANEOUS
  Filled 2015-04-19 (×5): qty 1

## 2015-04-19 MED ORDER — SODIUM CHLORIDE 0.9 % IV BOLUS (SEPSIS)
1000.0000 mL | Freq: Once | INTRAVENOUS | Status: AC
  Administered 2015-04-19: 1000 mL via INTRAVENOUS

## 2015-04-19 MED ORDER — KCL IN DEXTROSE-NACL 20-5-0.45 MEQ/L-%-% IV SOLN
Freq: Once | INTRAVENOUS | Status: AC
  Administered 2015-04-19: 1000 mL via INTRAVENOUS
  Filled 2015-04-19: qty 1000

## 2015-04-19 MED ORDER — LORAZEPAM 2 MG/ML IJ SOLN
1.0000 mg | Freq: Once | INTRAMUSCULAR | Status: AC
  Administered 2015-04-19: 1 mg via INTRAVENOUS
  Filled 2015-04-19: qty 1

## 2015-04-19 MED ORDER — MUPIROCIN 2 % EX OINT
1.0000 "application " | TOPICAL_OINTMENT | Freq: Two times a day (BID) | CUTANEOUS | Status: DC
  Administered 2015-04-19 – 2015-04-21 (×4): 1 via NASAL
  Filled 2015-04-19 (×2): qty 22

## 2015-04-19 MED ORDER — ONDANSETRON HCL 4 MG/2ML IJ SOLN
4.0000 mg | Freq: Four times a day (QID) | INTRAMUSCULAR | Status: DC | PRN
  Administered 2015-04-19: 4 mg via INTRAVENOUS
  Filled 2015-04-19: qty 2

## 2015-04-19 MED ORDER — ZOLEDRONIC ACID 4 MG/5ML IV CONC
4.0000 mg | Freq: Once | INTRAVENOUS | Status: AC
  Administered 2015-04-19: 4 mg via INTRAVENOUS
  Filled 2015-04-19: qty 5

## 2015-04-19 MED ORDER — GADOBENATE DIMEGLUMINE 529 MG/ML IV SOLN: 20.0000 mL | Freq: Once | INTRAVENOUS | Status: DC | PRN

## 2015-04-19 MED ORDER — ACETAMINOPHEN 650 MG RE SUPP: 650.0000 mg | Freq: Four times a day (QID) | RECTAL | Status: DC | PRN

## 2015-04-19 MED ORDER — IOHEXOL 300 MG/ML  SOLN
80.0000 mL | Freq: Once | INTRAMUSCULAR | Status: AC | PRN
  Administered 2015-04-19: 80 mL via INTRAVENOUS

## 2015-04-19 MED ORDER — PROMETHAZINE HCL 25 MG/ML IJ SOLN
25.0000 mg | Freq: Once | INTRAMUSCULAR | Status: AC
  Administered 2015-04-19: 25 mg via INTRAVENOUS
  Filled 2015-04-19: qty 1

## 2015-04-19 MED ORDER — ACETAMINOPHEN 325 MG PO TABS: 650.0000 mg | ORAL_TABLET | Freq: Four times a day (QID) | ORAL | Status: DC | PRN

## 2015-04-19 MED ORDER — GI COCKTAIL ~~LOC~~
30.0000 mL | Freq: Once | ORAL | Status: AC
  Administered 2015-04-19: 30 mL via ORAL
  Filled 2015-04-19: qty 30

## 2015-04-19 MED ORDER — FENTANYL 50 MCG/HR TD PT72
75.0000 ug | MEDICATED_PATCH | TRANSDERMAL | Status: DC
  Administered 2015-04-19: 75 ug via TRANSDERMAL
  Filled 2015-04-19: qty 1

## 2015-04-19 MED ORDER — CHLORHEXIDINE GLUCONATE CLOTH 2 % EX PADS
6.0000 | MEDICATED_PAD | Freq: Every day | CUTANEOUS | Status: DC
  Administered 2015-04-20 – 2015-04-21 (×2): 6 via TOPICAL

## 2015-04-19 MED ORDER — PROCHLORPERAZINE EDISYLATE 5 MG/ML IJ SOLN
5.0000 mg | INTRAMUSCULAR | Status: DC | PRN
  Administered 2015-04-19 – 2015-04-21 (×5): 5 mg via INTRAVENOUS
  Filled 2015-04-19 (×2): qty 1
  Filled 2015-04-19: qty 2
  Filled 2015-04-19 (×4): qty 1

## 2015-04-19 MED ORDER — HYDROMORPHONE HCL 1 MG/ML IJ SOLN
1.0000 mg | Freq: Once | INTRAMUSCULAR | Status: AC
Start: 2015-04-19 — End: 2015-04-19
  Administered 2015-04-19: 1 mg via INTRAVENOUS
  Filled 2015-04-19: qty 1

## 2015-04-19 MED ORDER — ONDANSETRON HCL 4 MG/2ML IJ SOLN
4.0000 mg | Freq: Once | INTRAMUSCULAR | Status: AC
  Administered 2015-04-19: 4 mg via INTRAVENOUS
  Filled 2015-04-19: qty 2

## 2015-04-19 MED ORDER — HYDRALAZINE HCL 20 MG/ML IJ SOLN: 5.0000 mg | INTRAMUSCULAR | Status: DC | PRN

## 2015-04-19 MED ORDER — METOCLOPRAMIDE HCL 5 MG/ML IJ SOLN
10.0000 mg | Freq: Once | INTRAMUSCULAR | Status: AC
  Administered 2015-04-19: 10 mg via INTRAVENOUS
  Filled 2015-04-19: qty 2

## 2015-04-19 MED ORDER — HYDROMORPHONE HCL 1 MG/ML IJ SOLN
0.5000 mg | INTRAMUSCULAR | Status: DC | PRN
  Administered 2015-04-19 – 2015-04-21 (×19): 0.5 mg via INTRAVENOUS
  Filled 2015-04-19 (×19): qty 1

## 2015-04-19 MED ORDER — LORAZEPAM 2 MG/ML IJ SOLN: 1.0000 mg | Freq: Four times a day (QID) | INTRAMUSCULAR | Status: DC | PRN

## 2015-04-19 MED ORDER — HYDROMORPHONE HCL 1 MG/ML IJ SOLN
1.0000 mg | Freq: Once | INTRAMUSCULAR | Status: AC
  Administered 2015-04-19: 1 mg via INTRAVENOUS
  Filled 2015-04-19: qty 1

## 2015-04-19 MED ORDER — BARIUM SULFATE 2.1 % PO SUSP
450.0000 mL | Freq: Once | ORAL | Status: AC
  Administered 2015-04-19: 450 mL via ORAL

## 2015-04-19 NOTE — Care Management Note (Signed)
Case Management Note  Patient Details  Name: Kerri Vasquez MRN: [id number] Date of Birth: 10-16-1955  Subjective/Objective:                  Date-04-19-15 Initial Assessment Spoke with patient at the bedside  Introduced self as case manager and explained role in discharge planning and how to be reached.  Verified patient lives in [place] daughter Verified patient anticipates to go home with daughter of discharge and will have part-time supervision by family at this time to best of their knowledge.  Patient has DME cane walker  wheelchair. Expressed potential need for no other DME.  Patient denied needing help with their medication.  Patient is driven by daughter to MD appointments.  Verified patient has PCP Luciana Axe Patient states they currently receive [facility] services through no one.    Plan: CM will continue to follow for discharge planning and [facility] resources.   Carles Collet RN BSN CM [phone]   Action/Plan:  NO CM needs identified at this time, will continue to monitor.  Expected Discharge Date:                  Expected Discharge Plan:  Home/Self Care  In-House Referral:     Discharge planning Services  CM Consult  Post Acute Care Choice:    Choice offered to:     DME Arranged:    DME Agency:     HH Arranged:    HH Agency:     Status of Service:  In process, will continue to follow  Medicare Important Message Given:    Date Medicare IM Given:    Medicare IM give by:    Date Additional Medicare IM Given:    Additional Medicare Important Message give by:     If discussed at [place] of Stay Meetings, dates discussed:    Additional Comments:  Carles Collet, RN 04/19/2015, 2:51 PM

## 2015-04-19 NOTE — Consult Note (Signed)
Referral MD  Reason for Referral: Metastatic breast cancer   Chief Complaint  Patient presents with  . Emesis  . Chest Pain  :  My cancer is spreading.  HPI: Kerri Vasquez is well-known to me. She is a 59-year-old postmenopausal female. She has known metastatic breast cancer. We have been following her probably for about 2 or 3 years. To date, her breast cancer has been estrogen positive. Most recently, she has received Faslodex with Afinitor.  A recent PET scan that was done on her seem to show that she had progressing disease. Her CA 27.29 have been trending upward. In August he was 44. In November, it was 68.  She recently underwent another liver biopsy. This was done on December 8. The pathology report did show that she had metastatic breast cancer to the liver. The tumor was still estrogen positive and HER-2 negative.  We have exhausted all antiestrogen therapies. As such, we were going to institute chemotherapy.  She was recently admitted. She nausea and vomiting. His salivation may have had some type of gastroenteritis. I don't think she had any obstruction. A CT of the abdomen and pelvis done today showed constipation. She had interval increase of a lesion in the left lobe of the liver. There are multiple lesions in the dome of the liver which appear to be new. She also had lytic lesions at L3 and L4.  To date,, she has not had any bone directed therapy. She's not had any bony lesions.  We are asked to see her to try to help with management of the progressive breast cancer.  She actually is quite good. She is eating some clear liquids right now. She's not had any nausea or vomiting.  Her last MRI the brain was back in January 2016. I suspect she will need another one.  His chronic pain issues. She has bad arthritis. Other she may have rheumatoid arthritis. I know she has osteoarthritis.  She has intermittent iron deficiency anemia. Today, her hemoglobin was 11.6 with hematocrit  of 36.3. Herpetic genital bit depressed at 97K.  Her lites lites show sodium of 142. Potassium 4.5. BUN 14 and creatinine 1.3.   She does not have any diarrhea. She is having no urinary symptoms.  Overall, I said that her performance status is ECOG 1-2.            Past Medical History  Diagnosis Date  . Depression   . Hypertension   . Hypothyroidism   . Incisional hernia   . Spinal stenosis   . Scoliosis   . Sciatica   . Intestinal obstruction (HCC)   . GERD (gastroesophageal reflux disease)   . Septic arthritis (HCC) 01/28/10    and spinal stenosis , moderate scoliosis   . Osteoarthritis of multiple joints   . Sleep apnea     hx of off machine x 4 years   . Renal insufficiency     hx renal failure 2011  . Fibromyalgia   . Fracture of multiple toes     right toes x 4 toes- excluding small toe  . Diabetes mellitus     type II, controlled by diet 05/25/11   . CHF (congestive heart failure) (HCC)   . Anemia   . Pernicious anemia 05/30/2011    Iron transfusion and VB12 on 06/06/11  . Migraine     "weekly; but they don't last long" (08/26/2014)  . Chronic lower back pain   . Breast cancer, right breast (HCC)   .   Liver cancer ([place])     "twice"  . Thyroid cancer ([place])   . Myocardial infarction ([place] [facility]) 06/2014    S/P percutaneous intervention procedure a metastatic lesion in the liver  . Nausea and vomiting 04/2015  :  Past Surgical History  Procedure Laterality Date  . Tonsillectomy  1969  . Multiple extractions with alveoloplasty  06/08/2011    Procedure: MULTIPLE EXTRACION WITH ALVEOLOPLASTY;  Surgeon: Lenn Cal, DDS;  Location: [facility];  Service: Oral Surgery;  Laterality: N/A;  Extraction of tooth #'s 3,4,7 with alveoloplasty and Gross Debridement of Remaining Teeth  . Gastric bypass  2006    "w/scope"  . Colectomy  2011    "SBO"  . Radiofrequency ablation liver tumor  2011; 01/26/2012  . Thyroid lobectomy  02/16/2012    Procedure: THYROID LOBECTOMY;   Surgeon: Earnstine Regal, MD;  Location: [facility];  Service: General;  Laterality: Right;  . Left heart catheterization with coronary angiogram N/A 07/14/2014    Procedure: LEFT HEART CATHETERIZATION WITH CORONARY ANGIOGRAM;  Surgeon: Leonie Man, MD;  Location: [facility] CATH LAB;  Service: Cardiovascular;  Laterality: N/A;  . Cholecystectomy open  06/29/85  . Breast implant removal Left 2015  . Mastectomy Bilateral 2008  . Breast biopsy Right 2008  . Breast reconstruction Bilateral 2008; 2009    "w/the mastectomy; "  . Breast reconstruction Left ~ 2010    "it capsized"  . Cardiac catheterization    . Liver biopsy  04/2015  :   Current facility-administered medications:  .  0.45 % sodium chloride infusion, , Intravenous, Continuous, Lezlie Octave Black, NP, Last Rate: 100 mL/hr at 04/19/15 1218, 1,000 mL at 04/19/15 1218 .  acetaminophen (TYLENOL) tablet 650 mg, 650 mg, Oral, Q6H PRN **OR** acetaminophen (TYLENOL) suppository 650 mg, 650 mg, Rectal, Q6H PRN, Lezlie Octave Black, NP .  fentaNYL (DURAGESIC - dosed mcg/hr) patch 75 mcg, 75 mcg, Transdermal, Q72H, Waldemar Dickens, MD, 75 mcg at 04/19/15 1010 .  heparin injection 5,000 Units, 5,000 Units, Subcutaneous, 3 times per day, Radene Gunning, NP, 5,000 Units at 04/19/15 1509 .  hydrALAZINE (APRESOLINE) injection 5 mg, 5 mg, Intravenous, Q4H PRN, Lezlie Octave Black, NP .  HYDROmorphone (DILAUDID) injection 0.5 mg, 0.5 mg, Intravenous, Q2H PRN, Radene Gunning, NP, 0.5 mg at 04/19/15 1721 .  LORazepam (ATIVAN) injection 1 mg, 1 mg, Intravenous, Q6H PRN, Waldemar Dickens, MD .  ondansetron [facility]) injection 4-8 mg, 4-8 mg, Intravenous, Q6H PRN, Radene Gunning, NP, 8 mg at 04/19/15 1247 .  prochlorperazine (COMPAZINE) injection 5 mg, 5 mg, Intravenous, Q4H PRN, Radene Gunning, NP, 5 mg at 04/19/15 1509:  . fentaNYL  75 mcg Transdermal Q72H  . heparin  5,000 Units Subcutaneous 3 times per day  :  Allergies  Allergen Reactions  . Influenza Vac Split [Flu Virus  Vaccine] Other (See Comments)    Joint stiffness and renal failure  . Ritalin [Methylphenidate Hcl]     "Heart attack"  . Zostavax [Zoster Vaccine Live ([employer])] Other (See Comments)    Joint stiffness, renal failure  . Adhesive [Tape] Itching and Rash  :  Family History  Problem Relation Age of Onset  . Cancer Mother     breast  . Hypertension Mother   . Anesthesia problems Daughter   :  Social History   Social History  . Marital Status: Divorced    Spouse Name: N/A  . Number of Children: 1  . Years of Education:  N/A   Occupational History  . Not on file.   Social History Main Topics  . Smoking status: Never Smoker   . Smokeless tobacco: Never Used     Comment: NEVER USED TOBACCO  . Alcohol Use: No  . Drug Use: No  . Sexual Activity: Not Currently   Other Topics Concern  . Not on file   Social History Narrative   Caffeine use: 3 cups coffee/tea daily   Regular exercise: no        :  Pertinent items are noted in HPI.  Exam: Patient Vitals for the past 24 hrs:  BP Temp Temp src Pulse Resp SpO2 Height Weight  04/19/15 1311 132/73 mmHg 99.1 F (37.3 C) Oral 90 18 96 % - -  04/19/15 0928 (!) 173/85 mmHg 98.9 F (37.2 C) Oral 83 18 99 % - -  04/19/15 0927 - - - - - - 5' 6" (1.676 m) 226 lb (102.513 kg)  04/19/15 0800 164/86 mmHg - - 89 - 100 % - -  04/19/15 0730 153/78 mmHg - - 90 - 99 % - -  04/19/15 0727 153/78 mmHg - - 92 20 100 % - -  04/19/15 0530 170/97 mmHg - - 88 14 98 % - -  04/19/15 0500 171/95 mmHg - - 93 12 98 % - -  04/19/15 0430 165/95 mmHg - - 95 13 97 % - -  04/19/15 0420 171/96 mmHg - - 95 12 98 % - -  04/18/15 2354 (!) 152/118 mmHg 98.7 F (37.1 C) Oral 82 22 98 % - -    as above    Recent Labs  04/19/15 0208  WBC 7.8  HGB 11.6*  HCT 36.3  PLT 97*    Recent Labs  04/19/15 0034  NA 142  K 4.5  CL 111  CO2 20*  GLUCOSE 136*  BUN 14  CREATININE 1.29*  CALCIUM 9.2    Blood smear review:  None  Pathology: None      Assessment and Plan:  Kerri Vasquez is very charming 59-year-old white female. She has metastatic breast cancer which appears to be progressing.  I think that she probably has some, viral gastroenteritis that caused all this GI symptoms. I don't think this is from her underlying metastatic disease.  I think that washing the hospital, we really have to get all the staging studies done. It is very, very, very difficult for her to have the staging studies done as an outpatient as it is hard to get her to the different appointments.  She will need to have a Port-A-Cath placed. [place] she will need to have an echocardiogram done. I suspect that she probably had Adriamycin-based chemotherapy back in 2008. She apparently had this transient heart failure earlier this year which seems to have resolved.  I really need to get a MRI of her back. I'll like to see exactly what the MRI shows.  She is MRI of the brain. She needs a CT scan of the chest.  I will repeat a CA 27.29.  She will be given some Zometa while in the hospital. Not yet given her Zometa as she's not had any bone metastasis.  I will have to speak to her daughter to let her know what is going on.  Hopefully, we can get all these studies done next couple days. In the meantime, hopefully she will increase her by mouth intake and be able to manage at home afterwards.  I spent about   an hour with her this evening.  Thank you for letting me know that she is in the hospital.   Pete E.  James 1:5-7     

## 2015-04-19 NOTE — ED Notes (Signed)
Pt. reports persistent emesis with central chest pressure onset this evening with mild SOB , denies fever or diarrhea.

## 2015-04-19 NOTE — ED Notes (Signed)
Patient transported to CT 

## 2015-04-19 NOTE — Progress Notes (Signed)
Pending on admission:  59 year old lady with past medical history for DM, CAD, myocardial infarction, congestive heart failure, breast cancer, liver cancer, thyroid cancer, currently on chemotherapy, who presents with nausea, vomiting, central chest pressure and mild shortness of breath. No diarrhea. No fever or chills. Chest x-ray is negative. Vital signs are stable. No leukocytosis, temperature normal. CT abdomen/pelvis  showed progressed liver lesions, has constipation otherwise non- impressive. Pt is accepted to med-durg bed.  Ivor Costa, MD  [facility] Pager [phone]  If 7PM-7AM, please contact night-coverage [email or web address] Password [facility] 04/19/2015, 6:32 AM

## 2015-04-19 NOTE — H&P (Signed)
[facility] History and Physical  Kerri Vasquez [id number] DOB: 04-01-1956 DOA: 04/19/2015  Referring physician: gray PCP: Kerri Bill, MD   Chief Complaint: intractable nausea/vomiting, chest pain  HPI: Kerri Vasquez is a 59 y.o. female past medical history that includes hypertension, metastatic breast cancer, diabetes, chronic pain chronic diastolic heart failure presents emergency Department chief complaint intractable nausea vomiting and intermittent chest pain. Initial evaluation reveals acute chronic kidney disease mild dehydration intended symptoms was likely viral gastroenteritis.  information is obtained from the patient and her daughter is at the bedside. She reports being in her usual state of health until yesterday afternoon when she developed persistent intermittent nausea. She took her anti-emetic medications her usual with little relief. Several hours later she developed emesis. She denies any coffee ground emesis. She reports 6 or 7 episodes clear then emesis. Associated symptoms include intermittent left anterior chest pain. She denies any fever chills cough dysuria. She does report her daughter with him she resides had the flu last week. last chemotherapy was in the middle of November. She denies ever suffering nausea vomiting associated with her chemotherapy. Chest pain located left anterior described as a "dullness" nonradiating. She denies diaphoresis shortness of breath lower extremity edema.  In the emergency department she's afebrile hemodynamically stable and not hypoxic. She is provided with IV fluids, Ativan, Phenergan, Zofran with little improvement.   Review of Systems:  10 point review of systems complete and all systems are negative except as indicated in the history of present illness  Past Medical History  Diagnosis Date  . Depression   . Hypertension   . Hypothyroidism   . Incisional hernia   . Spinal stenosis   . Scoliosis     . Sciatica   . Intestinal obstruction ([place])   . GERD (gastroesophageal reflux disease)   . Septic arthritis ([place]) 01/28/10    and spinal stenosis , moderate scoliosis   . Osteoarthritis of multiple joints   . Sleep apnea     hx of off machine x 4 years   . Renal insufficiency     hx renal failure 2011  . Fibromyalgia   . Fracture of multiple toes     right toes x 4 toes- excluding small toe  . Diabetes mellitus     type II, controlled by diet 05/25/11   . CHF (congestive heart failure) ([place])   . Anemia   . Pernicious anemia 05/30/2011    Iron transfusion and VB12 on 06/06/11  . Migraine     "weekly; but they don't last long" (08/26/2014)  . Chronic lower back pain   . Breast cancer, right breast ([place])   . Liver cancer ([place])     "twice"  . Thyroid cancer ([place])   . Myocardial infarction [facility]) 06/2014    S/P percutaneous intervention procedure a metastatic lesion in the liver   Past Surgical History  Procedure Laterality Date  . Tonsillectomy  1969  . Multiple extractions with alveoloplasty  06/08/2011    Procedure: MULTIPLE EXTRACION WITH ALVEOLOPLASTY;  Surgeon: Kerri Vasquez, DDS;  Location: [facility] ORS;  Service: Oral Surgery;  Laterality: N/A;  Extraction of tooth #'s 3,4,7 with alveoloplasty and Gross Debridement of Remaining Teeth  . Gastric bypass  2006    "w/scope"  . Colectomy  2011    "SBO"  . Radiofrequency ablation liver tumor  2011; 01/26/2012  . Thyroid lobectomy  02/16/2012    Procedure: THYROID LOBECTOMY;  Surgeon: Kerri Regal, MD;  Location: [facility] ORS;  Service: General;  Laterality: Right;  . Left heart catheterization with coronary angiogram N/A 07/14/2014    Procedure: LEFT HEART CATHETERIZATION WITH CORONARY ANGIOGRAM;  Surgeon: Kerri Man, MD;  Location: [facility] CATH LAB;  Service: Cardiovascular;  Laterality: N/A;  . Cholecystectomy open  06/29/85  . Breast implant removal Left 2015  . Mastectomy Bilateral 2008  . Breast biopsy Right 2008  . Breast  reconstruction Bilateral 2008; 2009    "w/the mastectomy; "  . Breast reconstruction Left ~ 2010    "it capsized"  . Cardiac catheterization     Social History:  reports that she has never smoked. She has never used smokeless tobacco. She reports that she does not drink alcohol or use illicit drugs. She lives at home with her daughter she is a retired Scientist, research (medical) she is independent with ADLs Allergies  Allergen Reactions  . Influenza Vac Split [Flu Virus Vaccine] Other (See Comments)    Joint stiffness and renal failure  . Ritalin [Methylphenidate Hcl]     "Heart attack"  . Zostavax [Zoster Vaccine Live ([employer])] Other (See Comments)    Joint stiffness, renal failure  . Adhesive [Tape] Itching and Rash    Family History  Problem Relation Age of Onset  . Cancer Mother     breast  . Hypertension Mother   . Anesthesia problems Daughter      Prior to Admission medications   Medication Sig Start Date End Date Taking? Authorizing Provider  carvedilol (COREG) 6.25 MG tablet Take 6.25 mg by mouth 2 (two) times daily with a meal.   Yes Historical Provider, MD  cyanocobalamin (,VITAMIN B-12,) 1000 MCG/ML injection INJECT 1 ML AS DIRECTED EVERY 21 DAYS 03/18/15  Yes [place], NP  everolimus (AFINITOR) 5 MG tablet Take 1 tablet (5 mg total) by mouth daily. 02/12/15  Yes Kerri Napoleon, MD  FLUoxetine (PROZAC) 40 MG capsule Take 40 mg by mouth daily before breakfast.    Yes Historical Provider, MD  fulvestrant (FASLODEX) 250 MG/5ML injection Inject 250 mg into the muscle every 30 (thirty) days. 02/26/12  Yes Historical Provider, MD  furosemide (LASIX) 20 MG tablet Take 60 mg by mouth daily.    Yes Historical Provider, MD  levothyroxine (SYNTHROID, LEVOTHROID) 200 MCG tablet Take 200 mcg by mouth daily. 12/15/14  Yes Historical Provider, MD  LORazepam (ATIVAN) 0.5 MG tablet PLACE 1 TABLET BY MOUTH UNDER THE TONGUE EVERY 6 HOURS AS NEEDED FOR NAUSEA Patient taking differently:  Take 1 mg by mouth every 6 (six) hours as needed for anxiety. PLACE 1 TABLET BY MOUTH UNDER THE TONGUE EVERY 6 HOURS AS NEEDED FOR NAUSEA 03/18/15  Yes [place], NP  methocarbamol (ROBAXIN) 750 MG tablet Take 750 mg by mouth 4 (four) times daily.   Yes Historical Provider, MD  Multiple Vitamin (MULTI-VITAMINS) TABS Take 1 tablet by mouth daily. 11/29/11  Yes Historical Provider, MD  ondansetron (ZOFRAN) 8 MG tablet Take 1 tablet (8 mg total) by mouth every 8 (eight) hours as needed for nausea or vomiting. 07/20/14  Yes Kerri Napoleon, MD  oxycodone (OXY-IR) 5 MG capsule Take 15 mg by mouth every 4 (four) hours.   Yes Historical Provider, MD  oxyCODONE-acetaminophen (PERCOCET) 10-325 MG per tablet Take 1 tablet by mouth every 5 (five) hours as needed (4 x per day). 7-25- "INCREASED BY PAIN DOCTOR"   Yes Historical Provider, MD  oxymorphone (OPANA ER) 15 MG 12 hr tablet Take 15  mg by mouth every 12 (twelve) hours.   Yes Historical Provider, MD  potassium chloride SA (KLOR-CON M20) 20 MEQ tablet Take 2 tablets (40 mEq total) by mouth 2 (two) times daily. Patient taking differently: Take 40 mEq by mouth daily.  09/04/14  Yes Larey Dresser, MD  pregabalin (LYRICA) 200 MG capsule Take 200 mg by mouth 3 (three) times daily.   Yes Historical Provider, MD  promethazine (PHENERGAN) 25 MG tablet TAKE 1 TABLET BY MOUTH THREE TIMES DAILY AS NEEDED FOR NAUSEA 04/14/15  Yes Eliezer Bottom, NP  spironolactone (ALDACTONE) 25 MG tablet Take 1 tablet (25 mg total) by mouth daily. 03/15/15  Yes Shaune Pascal Bensimhon, MD  temazepam (RESTORIL) 15 MG capsule Take 30 mg by mouth at bedtime.   Yes Historical Provider, MD  tiZANidine (ZANAFLEX) 4 MG tablet Take 4 mg by mouth every 6 (six) hours as needed for muscle spasms.   Yes Historical Provider, MD  nitroGLYCERIN (NITROSTAT) 0.4 MG SL tablet Place 1 tablet (0.4 mg total) under the tongue every 5 (five) minutes as needed for chest pain. 07/18/14   Annita Brod,  MD   Physical Exam: Filed Vitals:   04/19/15 0430 04/19/15 0500 04/19/15 0530 04/19/15 0727  BP: 165/95 171/95 170/97 153/78  Pulse: 95 93 88 92  Temp:      TempSrc:      Resp: 13 12 14 20   SpO2: 97% 98% 98% 100%    Wt Readings from Last 3 Encounters:  04/08/15 98.884 kg (218 lb)  03/18/15 98.884 kg (218 lb)  02/12/15 101.606 kg (224 lb)    General:  Appears somewhat acutely ill and slightly uncomfortable, obese Eyes: PERRL, normal lids, irises & conjunctiva ENT: grossly normal hearing, because membranes of her mouth are pink slightly dry Neck: no LAD, masses or thyromegaly Cardiovascular: RRR, no m/r/g. No LE edema.  Respiratory: CTA bilaterally, no w/r/r. Normal respiratory effort. Abdomen: soft, I'll diffuse tenderness to palpation 3 sluggish bowel sounds no guarding or rebounding Skin: no rash or induration seen on limited exam Musculoskeletal: grossly normal tone BUE/BLE Psychiatric: grossly normal mood and affect, speech fluent and appropriate Neurologic: grossly non-focal. Speech clear facial symmetry           Labs on Admission:  Basic Metabolic Panel:  Recent Labs Lab 04/19/15 0034  NA 142  K 4.5  CL 111  CO2 20*  GLUCOSE 136*  BUN 14  CREATININE 1.29*  CALCIUM 9.2   Liver Function Tests:  Recent Labs Lab 04/19/15 0208 04/19/15 0635  AST 26  --   ALT 16  --   ALKPHOS 93  --   BILITOT NOT DONE 0.8  PROT 6.7  --   ALBUMIN 3.7  --    No results for input(s): LIPASE, AMYLASE in the last 168 hours. No results for input(s): AMMONIA in the last 168 hours. CBC:  Recent Labs Lab 04/19/15 0208  WBC 7.8  HGB 11.6*  HCT 36.3  MCV 89.2  PLT 97*   Cardiac Enzymes:  Recent Labs Lab 04/19/15 0635  TROPONINI 0.04*    BNP (last 3 results)  Recent Labs  07/11/14 1340 08/26/14 1617 09/14/14 1230  BNP 3001.6* 101.3* 102.9*    ProBNP (last 3 results) No results for input(s): PROBNP in the last 8760 hours.  CBG: No results for  input(s): GLUCAP in the last 168 hours.  Radiological Exams on Admission: Dg Chest 2 View  04/19/2015  CLINICAL DATA:  Chest pressure with  nausea and vomiting EXAM: CHEST  2 VIEW COMPARISON:  09/30/2014 FINDINGS: Normal heart size and mediastinal contours. No acute infiltrate or edema. No effusion or pneumothorax. No acute osseous findings. IMPRESSION: No evidence of active cardiopulmonary disease. Electronically Signed   By: Monte Fantasia M.D.   On: 04/19/2015 01:15   Ct Abdomen Pelvis W Contrast  04/19/2015  CLINICAL DATA:  59 year old female with abdominal pain and vomiting. History of small-bowel obstruction. EXAM: CT ABDOMEN AND PELVIS WITH CONTRAST TECHNIQUE: Multidetector CT imaging of the abdomen and pelvis was performed using the standard protocol following bolus administration of intravenous contrast. CONTRAST:  16mL OMNIPAQUE IOHEXOL 300 MG/ML  SOLN COMPARISON:  CT dated 07/11/2014 FINDINGS: The visualized lung bases are clear. No intra-abdominal free air or free fluid. Partially visualized right cardiophrenic fluid density may represent a pericardial cyst. Partially visualized right breast implant. A thin low attenuating fluid collection noted in the superior aspect of the liver under the diaphragm which may represent a subcapsular collection or implants. There is a focal area low-attenuation in the left lobe of the liver along the falciform ligament measuring approximately 3.6 x 2.4 cm. This area appears increased in size compared to the prior study. Multiple small hypodense lesion noted at the dome of the liver measuring up to 1 cm. These lesions appear new compared to the prior study and are concerning for metastatic disease. Switch Create a 3 cholecystectomy. Dilated at the common bile duct, likely post cholecystectomy. The pancreas there is, spleen, and adrenal glands appear unremarkable. There is a 5 mm nonobstructing left renal inferior pole calculus. There is mild bilateral renal  cortical atrophy. There is an extrarenal pelvis on the right. No hydronephrosis. The visualized ureters and urinary bladder appear unremarkable. The uterus is grossly unremarkable. 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 No evidence of bowel obstruction. Moderate stool there is noted throughout the colon. No bowel inflammation. Normal appendix. The abdominal aorta and IVC appear patent. No portal venous gas identified. There is no adenopathy. Multiple small peritoneal defects noted in the upper anterior abdominal wall in the midline. There is focal area of protrusion and abutment of a loop of small bowel compatible with adhesions. No evidence of bowel obstruction. There is degenerative changes of the spine. There are 6 non rib bearing lumbar vertebrae. There is extensive degenerative changes at L2-L3 and L3-L4. Multiple lytic lesions noted at L3 and L4 concerning for metastatic disease. IMPRESSION: Constipation.  No evidence of bowel obstruction or inflammation. Interval increase in the size of the hypo attenuating lesion along the falciform ligament in the left lobe of the liver. Multiple hypodense lesions in the dome of the liver, new from prior study. Findings compatible with progression of hepatic metastatic disease. Subcapsular fluid collection/ implant over the hepatic dome. Multiple lytic lesion predominantly involving L3 and L4 vertebra concerning for metastatic disease. Electronically Signed   By: Anner Crete M.D.   On: 04/19/2015 03:02    EKG: Independently reviewed EKG normal sinus rhythm  Assessment/Plan Principal Problem:   Intractable nausea and vomiting Active Problems:   Breast cancer metastasized to liver [facility])   Essential hypertension   Hypothyroidism   Chest pain   Chronic pain syndrome   Chronic diastolic heart failure (HCC)   Diabetes mellitus ([place])   Acute kidney injury superimposed on chronic kidney disease ([place])   #1. Intractable nausea and  vomiting/gastroenteritis. Likely viral. CT of the abdomen pelvis reveals constipation without inflammation or obstruction swell as progressive hepatic metastatic disease -  Admit to MedSurg -Influenza panel -Supportive therapy , Zofran, Compazine, IV fluids -Clear liquids and advance as tolerated.  2. Chest pain. Intermittent. History of MI in March of this year. EKG without acute changes. Mild elevation and initial troponin likely related to above -Cycle troponins -Serial EKG -Monitor  #3. Hypertension. Blood pressure in the emergency room at the high end of normal. -Home medications include Coreg, Lasix -We'll hold these for now -When necessary hydralazine -Resume  #4. Diabetes -We'll obtain a hemoglobin A1c -Clear liquids for now as noted above -Appears diet-controlled -Monitor  5. Chronic diastolic heart failure. Grade 1 diastolic heart failure. Most recent EF 55% -Gentle IV fluids. -Daily weights and monitor intake and output -Holding beta blocker for now as noted above -Resume once tolerating clear liquids  #6. Acute kidney injury superimposed on chronic kidney disease. Stage II. -Related to #1 -Old nephrotoxins -Gentle IV fluids -Recheck in the morning  #7. Static breast cancer to liver. Recent imaging shows advancement. She had a biopsy several weeks ago. -Follow-up with oncology for plan -Awaiting liver function tests   Code Status: DO NOT RESUSCITATE  DVT Prophylaxis: Family Communication: daughter at bedside  Disposition Plan: home hopefully 24 hours  Time spent: 54 minutes  [facility]

## 2015-04-19 NOTE — ED Notes (Signed)
Report given to [facility] RN 

## 2015-04-19 NOTE — Progress Notes (Addendum)
Patient trasfered from ED to 5W01 via stretcher; alert and oriented x 4; complaints of generalized pain (patient was medicated in ED); IV in LW running fluids@100cc /hr; skin intact (was checked by RN staff and charge nurse. Orient patient to room and unit; gave patient care guide; instructed how to use the call bell and  fall risk precautions. Patient and her daughter said that she is not DNR and she doesn't want to be DNR. MD notified. Will continue to monitor the patient.

## 2015-04-20 ENCOUNTER — Encounter (HOSPITAL_COMMUNITY): Payer: Self-pay

## 2015-04-20 ENCOUNTER — Observation Stay (HOSPITAL_COMMUNITY): Payer: Medicare Other

## 2015-04-20 DIAGNOSIS — I10 Essential (primary) hypertension: Secondary | ICD-10-CM | POA: Diagnosis not present

## 2015-04-20 DIAGNOSIS — C787 Secondary malignant neoplasm of liver and intrahepatic bile duct: Secondary | ICD-10-CM | POA: Diagnosis not present

## 2015-04-20 DIAGNOSIS — C7951 Secondary malignant neoplasm of bone: Secondary | ICD-10-CM | POA: Diagnosis not present

## 2015-04-20 DIAGNOSIS — N179 Acute kidney failure, unspecified: Secondary | ICD-10-CM | POA: Diagnosis not present

## 2015-04-20 DIAGNOSIS — R0789 Other chest pain: Secondary | ICD-10-CM

## 2015-04-20 DIAGNOSIS — R1110 Vomiting, unspecified: Secondary | ICD-10-CM

## 2015-04-20 DIAGNOSIS — C50919 Malignant neoplasm of unspecified site of unspecified female breast: Secondary | ICD-10-CM | POA: Diagnosis not present

## 2015-04-20 DIAGNOSIS — R7989 Other specified abnormal findings of blood chemistry: Secondary | ICD-10-CM | POA: Diagnosis not present

## 2015-04-20 DIAGNOSIS — N189 Chronic kidney disease, unspecified: Secondary | ICD-10-CM

## 2015-04-20 DIAGNOSIS — K529 Noninfective gastroenteritis and colitis, unspecified: Secondary | ICD-10-CM | POA: Diagnosis not present

## 2015-04-20 DIAGNOSIS — R778 Other specified abnormalities of plasma proteins: Secondary | ICD-10-CM | POA: Diagnosis present

## 2015-04-20 DIAGNOSIS — R079 Chest pain, unspecified: Secondary | ICD-10-CM | POA: Diagnosis not present

## 2015-04-20 LAB — CBC WITH DIFFERENTIAL/PLATELET
BASOS PCT: 1 %
Basophils Absolute: 0 10*3/uL (ref 0.0–0.1)
Eosinophils Absolute: 0.1 10*3/uL (ref 0.0–0.7)
Eosinophils Relative: 3 %
HCT: 31.7 % — ABNORMAL LOW (ref 36.0–46.0)
HEMOGLOBIN: 10.1 g/dL — AB (ref 12.0–15.0)
LYMPHS ABS: 1.4 10*3/uL (ref 0.7–4.0)
LYMPHS PCT: 34 %
MCH: 29.2 pg (ref 26.0–34.0)
MCHC: 31.9 g/dL (ref 30.0–36.0)
MCV: 91.6 fL (ref 78.0–100.0)
MONO ABS: 0.4 10*3/uL (ref 0.1–1.0)
MONOS PCT: 10 %
NEUTROS ABS: 2.2 10*3/uL (ref 1.7–7.7)
NEUTROS PCT: 52 %
Platelets: 90 10*3/uL — ABNORMAL LOW (ref 150–400)
RBC: 3.46 MIL/uL — ABNORMAL LOW (ref 3.87–5.11)
RDW: 13.3 % (ref 11.5–15.5)
WBC: 4.1 10*3/uL (ref 4.0–10.5)

## 2015-04-20 LAB — BASIC METABOLIC PANEL
ANION GAP: 11 (ref 5–15)
BUN: 11 mg/dL (ref 6–20)
CALCIUM: 8.5 mg/dL — AB (ref 8.9–10.3)
CO2: 21 mmol/L — AB (ref 22–32)
CREATININE: 1.1 mg/dL — AB (ref 0.44–1.00)
Chloride: 111 mmol/L (ref 101–111)
GFR calc Af Amer: >60 mL/min (ref >60)
GFR, EST NON AFRICAN AMERICAN: 53 mL/min — AB (ref >60)
GLUCOSE: 83 mg/dL (ref 65–99)
Potassium: 3.6 mmol/L (ref 3.5–5.1)
Sodium: 143 mmol/L (ref 135–145)

## 2015-04-20 LAB — PROTIME-INR
INR: 1.17 (ref 0.00–1.49)
PROTHROMBIN TIME: 15.1 s (ref 11.6–15.2)

## 2015-04-20 LAB — TROPONIN I: Troponin I: 0.27 ng/mL — ABNORMAL HIGH (ref <0.031)

## 2015-04-20 LAB — HEMOGLOBIN A1C
Hgb A1c MFr Bld: 5.2 % (ref 4.8–5.6)
Mean Plasma Glucose: 103 mg/dL

## 2015-04-20 LAB — SEDIMENTATION RATE: Sed Rate: 40 mm/hr — ABNORMAL HIGH (ref 0–22)

## 2015-04-20 LAB — C-REACTIVE PROTEIN

## 2015-04-20 LAB — CANCER ANTIGEN 27.29: CA 27.29: 47.9 U/mL — ABNORMAL HIGH (ref 0.0–38.6)

## 2015-04-20 MED ORDER — SENNA 8.6 MG PO TABS
2.0000 | ORAL_TABLET | Freq: Every day | ORAL | Status: DC
  Filled 2015-04-20: qty 2

## 2015-04-20 MED ORDER — LORAZEPAM 1 MG PO TABS: 1.0000 mg | ORAL_TABLET | Freq: Four times a day (QID) | ORAL | Status: DC | PRN

## 2015-04-20 MED ORDER — FLUOXETINE HCL 20 MG PO CAPS
40.0000 mg | ORAL_CAPSULE | Freq: Every day | ORAL | Status: DC
  Administered 2015-04-20 – 2015-04-21 (×2): 40 mg via ORAL
  Filled 2015-04-20 (×2): qty 2

## 2015-04-20 MED ORDER — PANTOPRAZOLE SODIUM 40 MG PO TBEC
40.0000 mg | DELAYED_RELEASE_TABLET | Freq: Every day | ORAL | Status: DC
  Administered 2015-04-20 – 2015-04-21 (×2): 40 mg via ORAL
  Filled 2015-04-20 (×2): qty 1

## 2015-04-20 MED ORDER — MORPHINE SULFATE ER 15 MG PO TBCR
45.0000 mg | EXTENDED_RELEASE_TABLET | Freq: Two times a day (BID) | ORAL | Status: DC
  Administered 2015-04-20 – 2015-04-21 (×2): 45 mg via ORAL
  Filled 2015-04-20 (×2): qty 1

## 2015-04-20 MED ORDER — POLYETHYLENE GLYCOL 3350 17 G PO PACK
17.0000 g | PACK | Freq: Two times a day (BID) | ORAL | Status: DC
  Administered 2015-04-20: 17 g via ORAL
  Filled 2015-04-20 (×3): qty 1

## 2015-04-20 MED ORDER — METHOCARBAMOL 750 MG PO TABS
750.0000 mg | ORAL_TABLET | Freq: Four times a day (QID) | ORAL | Status: DC
  Administered 2015-04-20 – 2015-04-21 (×5): 750 mg via ORAL
  Filled 2015-04-20 (×5): qty 1

## 2015-04-20 MED ORDER — IOHEXOL 300 MG/ML  SOLN
75.0000 mL | Freq: Once | INTRAMUSCULAR | Status: AC | PRN
  Administered 2015-04-20: 75 mL via INTRAVENOUS

## 2015-04-20 MED ORDER — LEVOTHYROXINE SODIUM 150 MCG PO TABS
150.0000 ug | ORAL_TABLET | Freq: Every day | ORAL | Status: DC
  Administered 2015-04-20 – 2015-04-21 (×2): 150 ug via ORAL
  Filled 2015-04-20 (×2): qty 1

## 2015-04-20 MED ORDER — OXYCODONE HCL 5 MG PO TABS
10.0000 mg | ORAL_TABLET | ORAL | Status: DC | PRN
  Administered 2015-04-21: 15 mg via ORAL
  Filled 2015-04-20: qty 3

## 2015-04-20 MED ORDER — PREGABALIN 100 MG PO CAPS
200.0000 mg | ORAL_CAPSULE | Freq: Three times a day (TID) | ORAL | Status: DC
  Administered 2015-04-20 – 2015-04-21 (×4): 200 mg via ORAL
  Filled 2015-04-20 (×4): qty 2

## 2015-04-20 MED ORDER — OXYMORPHONE HCL ER 15 MG PO TB12: 15.0000 mg | ORAL_TABLET | Freq: Two times a day (BID) | ORAL | Status: DC

## 2015-04-20 MED ORDER — FLUOXETINE HCL 40 MG PO CAPS: 40.0000 mg | ORAL_CAPSULE | Freq: Every day | ORAL | Status: DC

## 2015-04-20 MED ORDER — NITROGLYCERIN 0.4 MG SL SUBL: 0.4000 mg | SUBLINGUAL_TABLET | SUBLINGUAL | Status: DC | PRN

## 2015-04-20 MED ORDER — CARVEDILOL 6.25 MG PO TABS
6.2500 mg | ORAL_TABLET | Freq: Two times a day (BID) | ORAL | Status: DC
  Administered 2015-04-20 – 2015-04-21 (×3): 6.25 mg via ORAL
  Filled 2015-04-20 (×3): qty 1

## 2015-04-20 NOTE — Care Management Obs Status (Signed)
[facility] NOTIFICATION   Patient Details  Name: Kerri Vasquez MRN: [id number] Date of Birth: 1955/11/08   Medicare Observation Status Notification Given:  Yes    Carles Collet, RN 04/20/2015, 3:36 PM

## 2015-04-20 NOTE — Progress Notes (Signed)
PATIENT DETAILS Name: Kerri Vasquez Age: 59 y.o. Sex: female Date of Birth: May 24, 1955 Admit Date: 04/19/2015 Admitting Physician Waldemar Dickens, MD [id number], Dola Factor, MD  Subjective: Continues to have lower chest/epigastric pain. Vomiting has resolved, nausea slightly better.  Assessment/Plan: Principal Problem: Intractable nausea and vomiting: Resume diet, add PPI, treat constipation. Could be from underlying cancer with hepatic metastases.  Active Problems: Chest pain: Probably GI related, start PPI. 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 Suspect the elevated troponin could be from mild renal failure or demand ischemia. Echocardiogram pending.   Acute on chronic kidney disease stage II: Likely prerenal azotemia. Improving with IV fluids.  Hypertension: Resume Coreg, follow BP trend  L2-L3 disc edema: Check blood cultures, check ESR/CRP. Doubt infection.  Metastatic breast cancer: Oncology following.  Chronic back pain: Will slowly resume patient's narcotics.  Hypothyroidism: TSH suppressed-decrease levothyroxine to 150 g (per patient her last adjustment was in March)  Constipation: Had BM this morning-recent bowel regimen, likely secondary to narcotics  Disposition: Remain inpatient  Antimicrobial agents  See below  Anti-infectives    None      DVT Prophylaxis: Prophylactic Heparin   Code Status: Full code   Family Communication None at bedside  Procedures: None  CONSULTS:  cardiology and IR  Time spent 40 minutes-Greater than 50% of this time was spent in counseling, explanation of diagnosis, planning of further management, and coordination of care.  MEDICATIONS: Scheduled Meds: . Chlorhexidine Gluconate Cloth  6 each Topical Q0600  . fentaNYL  75 mcg Transdermal Q72H  . heparin  5,000 Units Subcutaneous 3 times per day  . mupirocin ointment  1 application Nasal BID   Continuous Infusions:  PRN Meds:.acetaminophen  **OR** acetaminophen, gadobenate dimeglumine, hydrALAZINE, HYDROmorphone (DILAUDID) injection, LORazepam, ondansetron (ZOFRAN) IV, prochlorperazine    PHYSICAL EXAM: Vital signs in last 24 hours: Filed Vitals:   04/19/15 1311 04/19/15 2326 04/20/15 0630 04/20/15 0635  BP: 132/73 111/66 127/60   Pulse: 90 60 54   Temp: 99.1 F (37.3 C) 98.2 F (36.8 C) 98.1 F (36.7 C)   TempSrc: Oral Oral Oral   Resp: _0 Height:      Weight:    102.3 kg (225 lb 8.5 oz)  SpO2: 96% 100% 100%     Weight change:  Filed Weights   04/19/15 0927 04/20/15 0635  Weight: 102.513 kg (226 lb) 102.3 kg (225 lb 8.5 oz)   Body mass index is 36.42 kg/(m^2).   Gen Exam: Awake and alert with clear speech.   Neck: Supple, No JVD.   Chest: B/L Clear.   CVS: S1 S2 Regular, no murmurs.  Abdomen: soft, BS +, mildly tender in epigastric , non distended.  Extremities: no edema, lower extremities warm to touch. Neurologic: Non Focal.   Skin: No Rash.   Wounds: N/A.   Intake/Output from previous day:  Intake/Output Summary (Last 24 hours) at 04/20/15 1333 Last data filed at 04/19/15 1822  Gross per 24 hour  Intake 866.67 ml  Output      0 ml  Net 866.67 ml     LAB RESULTS: CBC  Recent Labs Lab 04/19/15 0208 04/20/15 0620  WBC 7.8 4.1  HGB 11.6* 10.1*  HCT 36.3 31.7*  PLT 97* 90*  MCV 89.2 91.6  MCH 28.5 29.2  MCHC 32.0 31.9  RDW 12.8 13.3  LYMPHSABS  --  1.4  MONOABS  --  0.4  EOSABS  --  0.1  BASOSABS  --  0.0    Chemistries   Recent Labs Lab 04/19/15 0034 04/20/15 0620  NA 142 143  K 4.5 3.6  CL 111 111  CO2 20* 21*  GLUCOSE 136* 83  BUN 14 11  CREATININE 1.29* 1.10*  CALCIUM 9.2 8.5*    CBG: No results for input(s): GLUCAP in the last 168 hours.  GFR Estimated Creatinine Clearance: 65.7 mL/min (by C-G formula based on Cr of 1.1).  Coagulation profile  Recent Labs Lab 04/20/15 0620  INR 1.17    Cardiac Enzymes  Recent Labs Lab 04/19/15 0635  04/19/15 1839 04/20/15 0620  TROPONINI 0.04* 0.25* 0.27*    Invalid input(s): POCBNP No results for input(s): DDIMER in the last 72 hours.  Recent Labs  04/19/15 1839  HGBA1C 5.2   No results for input(s): CHOL, HDL, LDLCALC, TRIG, CHOLHDL, LDLDIRECT in the last 72 hours.  Recent Labs  04/19/15 1839  TSH 0.020*   No results for input(s): VITAMINB12, FOLATE, FERRITIN, TIBC, IRON, RETICCTPCT in the last 72 hours. No results for input(s): LIPASE, AMYLASE in the last 72 hours.  Urine Studies No results for input(s): UHGB, CRYS in the last 72 hours.  Invalid input(s): UACOL, UAPR, USPG, UPH, UTP, UGL, UKET, UBIL, UNIT, UROB, ULEU, UEPI, UWBC, URBC, UBAC, CAST, UCOM, BILUA  MICROBIOLOGY: Recent Results (from the past 240 hour(s))  MRSA PCR Screening     Status: Abnormal   Collection Time: 04/19/15 12:22 PM  Result Value Ref Range Status   MRSA by PCR POSITIVE (A) NEGATIVE Final    Comment:        The GeneXpert MRSA Assay (FDA approved for NASAL specimens only), is one component of a comprehensive MRSA colonization surveillance program. It is not intended to diagnose MRSA infection nor to guide or monitor treatment for MRSA infections. RESULT CALLED TO, READ BACK BY AND VERIFIED WITH: Jefferson Fuel RN 15:25 04/19/15 ([email or web address])     RADIOLOGY STUDIES/RESULTS: Dg Chest 2 View  04/19/2015  CLINICAL DATA:  Chest pressure with nausea and vomiting EXAM: CHEST  2 VIEW COMPARISON:  09/30/2014 FINDINGS: Normal heart size and mediastinal contours. No acute infiltrate or edema. No effusion or pneumothorax. No acute osseous findings. IMPRESSION: No evidence of active cardiopulmonary disease. Electronically Signed   By: Monte Fantasia M.D.   On: 04/19/2015 01:15   Ct Chest W Contrast  04/20/2015  CLINICAL DATA:  Breast cancer with osseous metastases EXAM: CT CHEST WITH CONTRAST TECHNIQUE: Multidetector CT imaging of the chest was performed during intravenous contrast administration.  CONTRAST:  33m OMNIPAQUE IOHEXOL 300 MG/ML  SOLN COMPARISON:  MRI thoracic spine dated 04/19/2015. PET-CT dated 03/19/2015. CT abdomen pelvis dated 04/19/2015. FINDINGS: Mediastinum/Nodes: The heart is normal in size. No pericardial effusion. Fluid within the superior pericardial recess. No suspicious mediastinal, hilar, or axillary lymphadenopathy. Visualized right thyroid is surgically absent. Lungs/Pleura: New lungs are essentially clear. No suspicious pulmonary nodules.  No focal consolidation. Trace bilateral pleural effusions.  No pneumothorax. Upper abdomen: 2.1 x 3.3 cm hypodense lesion in segment 8 (series 201/image 49), compatible with metastasis when correlating with prior PET-CT. 2-3 additional lesions along the anterior hepatic dome (series 201/images 38, 40, and 41), better evaluated on the prior dedicated CT abdomen/pelvis. Visualized upper abdomen is also notable for small volume perihepatic ascites, postsurgical changes involving the stomach, and cholecystectomy clips. Musculoskeletal: Status post left mastectomy. Degenerative changes of the visualized thoracolumbar spine. No focal osseous lesion is  seen. IMPRESSION: Status post left mastectomy. No evidence of metastatic disease in the chest. Trace bilateral pleural effusions. Multiple hepatic metastases, measuring up to 3.3 cm in segment 8, better characterized on recent enhanced CT abdomen/pelvis. Electronically Signed   By: Julian Hy M.D.   On: 04/20/2015 08:04   Mr Jeri Cos OF Contrast  04/19/2015  CLINICAL DATA:  Staging for metastatic breast cancer. EXAM: MRI HEAD WITHOUT AND WITH CONTRAST TECHNIQUE: Multiplanar, multiecho pulse sequences of the brain and surrounding structures were obtained without and with intravenous contrast. CONTRAST:  37m MAGNEVIST GADOPENTETATE DIMEGLUMINE 469.01 MG/ML IV SOLN, 1 MULTIHANCE GADOBENATE DIMEGLUMINE 529 MG/ML IV SOLN COMPARISON:  MRI of the brain May 16, 2014 FINDINGS: The ventricles and  sulci are normal for patient's age. No abnormal parenchymal signal, mass lesions, mass effect. No abnormal parenchymal enhancement. No reduced diffusion to suggest acute ischemia or hypercellular tumor. No susceptibility artifact to suggest hemorrhage. A few scattered sub cm supratentorial and pontine white matter T2 hyperintensities are unchanged, compatible with chronic small vessel ischemic disease. Tiny old bilateral basal ganglia perivascular spaces and/or lacunar infarcts are unchanged. No abnormal extra-axial fluid collections. No extra-axial masses nor leptomeningeal enhancement. Normal major intracranial vascular flow voids seen at the skull base. Ocular globes and orbital contents are unremarkable though not tailored for evaluation. No suspicious calvarial bone marrow signal. No abnormal sellar expansion. Craniocervical junction maintained. Mild paranasal sinus mucosal thickening without air-fluid levels. The mastoid air cells appear well-aerated. IMPRESSION: No acute intracranial process nor MR findings of metastatic disease. Mild chronic small vessel ischemic disease. Electronically Signed   By: CElon AlasM.D.   On: 04/19/2015 23:32   Mr Thoracic Spine W Wo Contrast  04/20/2015  ADDENDUM REPORT: 04/20/2015 00:13 ADDENDUM: Partially imaged pericardial effusion versus fluid in RIGHT fissure, which would be better characterized on CT chest as clinically indicated. Electronically Signed   By: CElon AlasM.D.   On: 04/20/2015 00:13  04/20/2015  CLINICAL DATA:  Staging for metastatic breast cancer. Known liver metastasis. EXAM: MRI THORACIC AND LUMBAR SPINE WITHOUT AND WITH CONTRAST TECHNIQUE: Multiplanar and multiecho pulse sequences of the thoracic and lumbar spine were obtained without and with intravenous contrast. CONTRAST:  262mMAGNEVIST GADOPENTETATE DIMEGLUMINE 469.01 MG/ML IV SOLN, 1 MULTIHANCE GADOBENATE DIMEGLUMINE 529 MG/ML IV SOLN COMPARISON:  PET-CT March 09, 2015  FINDINGS: MR THORACIC SPINE FINDINGS Thoracic vertebral bodies are intact aligned and maintenance of thoracic kyphosis. Scattered chronic Schmorl's nodes. No suspicious STIR signal abnormality to suggest fracture or bony metastasis. Generalized low signal on fat saturation sequences most compatible with osteopenia. No suspicious osseous or intradiscal enhancement. Bulky bridging ventral osteophytes can be seen with DISH. Cervical spinal cord is normal morphology and signal characteristics, conus medullaris terminates at T12. No abnormal cord, leptomeningeal or epidural enhancement. Crescentic fluid signal and RIGHT lung, axial 18/30. Small bilateral pleural effusions. Tiny central T1-2 disc protrusion. Small LEFT central T4-5 disc protrusion. Moderate RIGHT central T6-7 and T7-8 disc protrusions. Axial sequences do not extend caudal to T8-9. No canal stenosis or neural foraminal narrowing at any level. MR LUMBAR SPINE FINDINGS Grade 1 L1-2 retrolisthesis. Lumbar vertebral bodies intact. Expansile bright T2 signal within the L2-3 disc associated with low T1: Enhancing signal within the L2 inferior and L3 superior endplates. Moderate to severe subacute on chronic L1-2 and L3-4 discogenic endplate changes, mild at L4-5. No focal abnormal enhancement to suggest metastatic disease. Conus medullaris terminates at T12-L1 and appears normal morphology and signal characteristics. Central displacement  of the cauda equina due to canal stenosis, cauda equina is otherwise unremarkable. No abnormal cord, leptomeningeal or epidural enhancement. Mild bright interstitial STIR signal within the medial LEFT psoas muscle without focal fluid collection or suspicious enhancement. Moderate to severe relatively symmetric paraspinal muscle atrophy. Level by level evaluation: T12-L1: Small broad-based disc bulge. Mild to moderate facet arthropathy and ligamentum flavum redundancy without canal stenosis or neural foraminal narrowing. L1-2:  Retrolisthesis. Moderate broad-based disc osteophyte complex eccentric to the LEFT encroaches upon the exited LEFT L1 nerve. Mild facet arthropathy and ligamentum flavum redundancy. No significant canal stenosis. Mild LEFT neural foraminal narrowing. L2-3: Moderate broad-based disc bulge asymmetric to the RIGHT with suspected RIGHT extra foraminal annular fissure. Severe facet arthropathy and ligamentum flavum redundancy with trace facet effusions which are likely reactive. Moderate canal stenosis, including RIGHT lateral recess effacement which may affect the traversing RIGHT L3 nerve. Mild RIGHT, severe LEFT neural foraminal narrowing. L3-4: Moderate broad-based disc bulge with faint enhancing fissure versus annular calcification. Moderate facet arthropathy and ligamentum flavum redundancy. Moderate canal stenosis. Moderate to severe RIGHT, mild LEFT neural foraminal narrowing. L4-5: Small to moderate broad-based disc bulge asymmetric to the RIGHT. Severe RIGHT, mild LEFT facet arthropathy and ligamentum flavum redundancy without canal stenosis. Severe RIGHT neural foraminal narrowing. L5-S1: Transitional anatomy, partially sacralized L5 vertebral body without disc bulge, canal stenosis. Mild RIGHT neural foraminal narrowing associated with severe facet arthropathy. IMPRESSION: MRI THORACIC SPINE: No acute osseous process nor MR findings of metastatic disease. Multilevel disc protrusions without canal stenosis or neural foraminal narrowing. MRI LUMBAR SPINE:  No MR findings of metastatic disease. Severe L2-3 disc edema and enhancing endplate changes, which can be seen with severe inflammatory changes or, osteomyelitis which is less favored. Recommend correlation with ESR/CRP. Grade 1 L1-2 retrolisthesis on degenerative basis. Moderate canal stenosis L2-3 and L3-4. Neural foraminal narrowing at all lumbar levels: Severe on the LEFT at L2-3, severe on the RIGHT at L4-5. Electronically Signed: By: Elon Alas  M.D. On: 04/20/2015 00:10   Mr Lumbar Spine W Wo Contrast  04/20/2015  ADDENDUM REPORT: 04/20/2015 00:13 ADDENDUM: Partially imaged pericardial effusion versus fluid in RIGHT fissure, which would be better characterized on CT chest as clinically indicated. Electronically Signed   By: Elon Alas M.D.   On: 04/20/2015 00:13  04/20/2015  CLINICAL DATA:  Staging for metastatic breast cancer. Known liver metastasis. EXAM: MRI THORACIC AND LUMBAR SPINE WITHOUT AND WITH CONTRAST TECHNIQUE: Multiplanar and multiecho pulse sequences of the thoracic and lumbar spine were obtained without and with intravenous contrast. CONTRAST:  28m MAGNEVIST GADOPENTETATE DIMEGLUMINE 469.01 MG/ML IV SOLN, 1 MULTIHANCE GADOBENATE DIMEGLUMINE 529 MG/ML IV SOLN COMPARISON:  PET-CT March 09, 2015 FINDINGS: MR THORACIC SPINE FINDINGS Thoracic vertebral bodies are intact aligned and maintenance of thoracic kyphosis. Scattered chronic Schmorl's nodes. No suspicious STIR signal abnormality to suggest fracture or bony metastasis. Generalized low signal on fat saturation sequences most compatible with osteopenia. No suspicious osseous or intradiscal enhancement. Bulky bridging ventral osteophytes can be seen with DISH. Cervical spinal cord is normal morphology and signal characteristics, conus medullaris terminates at T12. No abnormal cord, leptomeningeal or epidural enhancement. Crescentic fluid signal and RIGHT lung, axial 18/30. Small bilateral pleural effusions. Tiny central T1-2 disc protrusion. Small LEFT central T4-5 disc protrusion. Moderate RIGHT central T6-7 and T7-8 disc protrusions. Axial sequences do not extend caudal to T8-9. No canal stenosis or neural foraminal narrowing at any level. MR LUMBAR SPINE FINDINGS Grade 1 L1-2 retrolisthesis. Lumbar vertebral  bodies intact. Expansile bright T2 signal within the L2-3 disc associated with low T1: Enhancing signal within the L2 inferior and L3 superior endplates. Moderate to  severe subacute on chronic L1-2 and L3-4 discogenic endplate changes, mild at L4-5. No focal abnormal enhancement to suggest metastatic disease. Conus medullaris terminates at T12-L1 and appears normal morphology and signal characteristics. Central displacement of the cauda equina due to canal stenosis, cauda equina is otherwise unremarkable. No abnormal cord, leptomeningeal or epidural enhancement. Mild bright interstitial STIR signal within the medial LEFT psoas muscle without focal fluid collection or suspicious enhancement. Moderate to severe relatively symmetric paraspinal muscle atrophy. Level by level evaluation: T12-L1: Small broad-based disc bulge. Mild to moderate facet arthropathy and ligamentum flavum redundancy without canal stenosis or neural foraminal narrowing. L1-2: Retrolisthesis. Moderate broad-based disc osteophyte complex eccentric to the LEFT encroaches upon the exited LEFT L1 nerve. Mild facet arthropathy and ligamentum flavum redundancy. No significant canal stenosis. Mild LEFT neural foraminal narrowing. L2-3: Moderate broad-based disc bulge asymmetric to the RIGHT with suspected RIGHT extra foraminal annular fissure. Severe facet arthropathy and ligamentum flavum redundancy with trace facet effusions which are likely reactive. Moderate canal stenosis, including RIGHT lateral recess effacement which may affect the traversing RIGHT L3 nerve. Mild RIGHT, severe LEFT neural foraminal narrowing. L3-4: Moderate broad-based disc bulge with faint enhancing fissure versus annular calcification. Moderate facet arthropathy and ligamentum flavum redundancy. Moderate canal stenosis. Moderate to severe RIGHT, mild LEFT neural foraminal narrowing. L4-5: Small to moderate broad-based disc bulge asymmetric to the RIGHT. Severe RIGHT, mild LEFT facet arthropathy and ligamentum flavum redundancy without canal stenosis. Severe RIGHT neural foraminal narrowing. L5-S1: Transitional anatomy, partially sacralized  L5 vertebral body without disc bulge, canal stenosis. Mild RIGHT neural foraminal narrowing associated with severe facet arthropathy. IMPRESSION: MRI THORACIC SPINE: No acute osseous process nor MR findings of metastatic disease. Multilevel disc protrusions without canal stenosis or neural foraminal narrowing. MRI LUMBAR SPINE:  No MR findings of metastatic disease. Severe L2-3 disc edema and enhancing endplate changes, which can be seen with severe inflammatory changes or, osteomyelitis which is less favored. Recommend correlation with ESR/CRP. Grade 1 L1-2 retrolisthesis on degenerative basis. Moderate canal stenosis L2-3 and L3-4. Neural foraminal narrowing at all lumbar levels: Severe on the LEFT at L2-3, severe on the RIGHT at L4-5. Electronically Signed: By: Elon Alas M.D. On: 04/20/2015 00:10   Ct Abdomen Pelvis W Contrast  04/19/2015  CLINICAL DATA:  59 year old female with abdominal pain and vomiting. History of small-bowel obstruction. EXAM: CT ABDOMEN AND PELVIS WITH CONTRAST TECHNIQUE: Multidetector CT imaging of the abdomen and pelvis was performed using the standard protocol following bolus administration of intravenous contrast. CONTRAST:  27m OMNIPAQUE IOHEXOL 300 MG/ML  SOLN COMPARISON:  CT dated 07/11/2014 FINDINGS: The visualized lung bases are clear. No intra-abdominal free air or free fluid. Partially visualized right cardiophrenic fluid density may represent a pericardial cyst. Partially visualized right breast implant. A thin low attenuating fluid collection noted in the superior aspect of the liver under the diaphragm which may represent a subcapsular collection or implants. There is a focal area low-attenuation in the left lobe of the liver along the falciform ligament measuring approximately 3.6 x 2.4 cm. This area appears increased in size compared to the prior study. Multiple small hypodense lesion noted at the dome of the liver measuring up to 1 cm. These lesions appear new  compared to the prior study and are concerning for metastatic disease. Switch Create a 3 cholecystectomy. Dilated  at the common bile duct, likely post cholecystectomy. The pancreas there is, spleen, and adrenal glands appear unremarkable. There is a 5 mm nonobstructing left renal inferior pole calculus. There is mild bilateral renal cortical atrophy. There is an extrarenal pelvis on the right. No hydronephrosis. The visualized ureters and urinary bladder appear unremarkable. The uterus is grossly unremarkable. 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 No evidence of bowel obstruction. Moderate stool there is noted throughout the colon. No bowel inflammation. Normal appendix. The abdominal aorta and IVC appear patent. No portal venous gas identified. There is no adenopathy. Multiple small peritoneal defects noted in the upper anterior abdominal wall in the midline. There is focal area of protrusion and abutment of a loop of small bowel compatible with adhesions. No evidence of bowel obstruction. There is degenerative changes of the spine. There are 6 non rib bearing lumbar vertebrae. There is extensive degenerative changes at L2-L3 and L3-L4. Multiple lytic lesions noted at L3 and L4 concerning for metastatic disease. IMPRESSION: Constipation.  No evidence of bowel obstruction or inflammation. Interval increase in the size of the hypo attenuating lesion along the falciform ligament in the left lobe of the liver. Multiple hypodense lesions in the dome of the liver, new from prior study. Findings compatible with progression of hepatic metastatic disease. Subcapsular fluid collection/ implant over the hepatic dome. Multiple lytic lesion predominantly involving L3 and L4 vertebra concerning for metastatic disease. Electronically Signed   By: Anner Crete M.D.   On: 04/19/2015 03:02   US Biopsy  04/08/2015  CLINICAL DATA:  59 year old female with a history of breast carcinoma. Suspicion of metastasis to  liver. EXAM: ULTRASOUND GUIDED CORE BIOPSY OF RIGHT LIVER MASS MEDICATIONS: 2.0 mg IV Versed; 75 mcg IV Fentanyl Total Moderate Sedation Time: 20 PROCEDURE: The procedure, risks, benefits, and alternatives were explained to the patient. Questions regarding the procedure were encouraged and answered. The patient understands and consents to the procedure. The right upper quad was prepped with Betadine in a sterile fashion, and a sterile drape was applied covering the operative field. A sterile gown and sterile gloves were used for the procedure. Local anesthesia was provided with 1% Lidocaine. Once the patient is prepped and draped in the usual sterile fashion, the skin and subcutaneous tissues were generously infiltrated 1% lidocaine for local anesthesia. Small incision was made with 11 blade scalpel, and using ultrasound guidance, a 17 gauge guide needle was advanced into the liver lesion of the right liver lobe. Three separate 18 gauge core biopsy were retrieved. Two Gel-Foam pledgets were infused. Final image demonstrates no complicating features. Patient tolerated the procedure well and remained hemodynamically stable throughout. No complications encountered and no significant blood loss encounter. COMPLICATIONS: None. FINDINGS: Ultrasound survey demonstrates heterogeneously hypoechoic lesion at right liver lobe. Images during the case demonstrate needle tip within the lesion on each needle pass. Final images demonstrate no complicating features, with expected gas secondary to Gel-Foam pledget infusion. IMPRESSION: Status post ultrasound-guided core biopsy of right liver lobe mass. Tissue specimen sent to pathology for complete histopathologic analysis. Signed, Dulcy Fanny. Earleen Newport, DO [facility] Electronically Signed   By: Corrie Mckusick D.O.   On: 04/08/2015 14:06    Oren Binet, MD  [facility] Pager:[phone]  If 7PM-7AM, please  contact night-coverage [email or web address] Password [email or web address] 04/20/2015, 1:33 PM

## 2015-04-20 NOTE — Consult Note (Signed)
Reason for Consult: elevated troponin    Referring Physician:   Dr. Sloan Leiter  PCP:  [name], MD  Primary Cardiologist:Dr. Bensimhon-CHF  Kerri Vasquez Kerri Vasquez is an 59 y.o. female.    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  Other hx is metastatic breast cancer s/p IR ablation of liver metastasis,  HTN, DM-2, chronic pain.  Last seen in HF clinic 12/2014 and she was stable.  Last echo 07/2014 with improved EF to 55-60% and she had G1DD.    Pt developed acute nausea and despite taking her anti-emitic she developed emesis.  Numerous episodes.  After she began vomiting she developed chest discomfort but different from the March episode and her troponin was 17 at that time.   She did recently undergo a liver biopsy 04/08/15.   Her troponins 0.04 to 0.27,   TSH 0.020  EKG today with flipped T waves V1,3,4,5,6, CXR without HF.  MR of brain, lumbar spine and thoracic spine without metastasis.  Dr. Marin Olp has seen and plans for port a cath placement for further chemo- plans for repeat Echo.   Past Medical History  Diagnosis Date  . Depression   . Hypertension   . Hypothyroidism   . Incisional hernia   . Spinal stenosis   . Scoliosis   . Sciatica   . Intestinal obstruction ([place])   . GERD (gastroesophageal reflux disease)   . Septic arthritis ([place]) 01/28/10    and spinal stenosis , moderate scoliosis   . Osteoarthritis of multiple joints   . Sleep apnea     hx of off machine x 4 years   . Renal insufficiency     hx renal failure 2011  . Fibromyalgia   . Fracture of multiple toes     right toes x 4 toes- excluding small toe  . Diabetes mellitus     type  II, controlled by diet 05/25/11   . CHF (congestive heart failure) ([place])   . Anemia   . Pernicious anemia 05/30/2011    Iron transfusion and VB12 on 06/06/11  . Migraine     "weekly; but they don't last long" (08/26/2014)  . Chronic lower back pain   . Breast cancer, right breast ([place])   . Liver cancer ([place])     "twice"  . Thyroid cancer ([place])   . Myocardial infarction ([place]) 06/2014    S/P percutaneous intervention procedure a metastatic lesion in the liver  . Nausea and vomiting 04/2015    Past Surgical History  Procedure Laterality Date  . Tonsillectomy  1969  . Multiple extractions with alveoloplasty  06/08/2011    Procedure: MULTIPLE EXTRACION WITH ALVEOLOPLASTY;  Surgeon: Lenn Cal, DDS;  Location: [facility];  Service: Oral Surgery;  Laterality: N/A;  Extraction of tooth #'s 3,4,7 with alveoloplasty and Gross Debridement of Remaining Teeth  . Gastric bypass  2006    "w/scope"  . Colectomy  2011    "SBO"  . Radiofrequency ablation liver tumor  2011; 01/26/2012  . Thyroid lobectomy  02/16/2012    Procedure: THYROID LOBECTOMY;  Surgeon: Earnstine Regal, MD;  Location: [facility];  Service: General;  Laterality: Right;  . Left heart catheterization with coronary angiogram N/A 07/14/2014    Procedure: LEFT HEART CATHETERIZATION WITH CORONARY ANGIOGRAM;  Surgeon: Leonie Man, MD;  Location: [facility] CATH LAB;  Service: Cardiovascular;  Laterality: N/A;  . Cholecystectomy open  06/29/85  . Breast implant removal Left 2015  . Mastectomy Bilateral 2008  . Breast biopsy Right 2008  . Breast reconstruction Bilateral 2008; 2009    "w/the mastectomy; "  . Breast reconstruction Left ~ 2010    "it capsized"  . Cardiac catheterization    . Liver biopsy  04/2015    Family History  Problem Relation Age of Onset  . Cancer Mother     breast  . Hypertension Mother   . Anesthesia problems Daughter    Social History:  reports that she has never smoked. She has never used smokeless tobacco. She  reports that she does not drink alcohol or use illicit drugs.  Allergies:  Allergies  Allergen Reactions  . Influenza Vac Split [Flu Virus Vaccine] Other (See Comments)    Joint stiffness and renal failure  . Ritalin [Methylphenidate Hcl]     "Heart attack"  . Zostavax [Zoster Vaccine Live ([employer])] Other (See Comments)    Joint stiffness, renal failure  . Adhesive [Tape] Itching and Rash    '@medshecduled' @ '@medinfusions' @ acetaminophen **OR** acetaminophen, gadobenate dimeglumine, hydrALAZINE, HYDROmorphone (DILAUDID) injection, LORazepam, LORazepam, nitroGLYCERIN, ondansetron (ZOFRAN) IV, oxyCODONE, prochlorperazine  Results for orders placed or performed during the hospital encounter of 04/19/15 (from the past 48 hour(s))  I-stat troponin, ED (not at [facility], [facility])     Status: None   Collection Time: 04/19/15 12:22 AM  Result Value Ref Range   Troponin i, poc 0.07 0.00 - 0.08 ng/mL   Comment 3            Comment: Due to the release kinetics of cTnI, a negative result within the first hours of the onset of symptoms does not rule out myocardial infarction with certainty. If myocardial infarction is still suspected, repeat the test at appropriate intervals.   Basic metabolic panel     Status: Abnormal   Collection Time: 04/19/15 12:34 AM  Result Value Ref Range   Sodium 142 135 - 145 mmol/L   Potassium 4.5 3.5 - 5.1 mmol/L   Chloride 111 101 - 111 mmol/L   CO2 20 (L) 22 - 32 mmol/L   Glucose, Bld 136 (H) 65 - 99 mg/dL   BUN 14 6 - 20 mg/dL   Creatinine, Ser 1.29 (H) 0.44 - 1.00 mg/dL   Calcium 9.2 8.9 - 10.3 mg/dL   GFR calc non Af Amer 44 (L) >60 mL/min   GFR calc Af Amer 51 (L) >60 mL/min    Comment: (NOTE) The eGFR has been calculated using the CKD EPI equation. This calculation has not been validated in all clinical situations. eGFR's persistently <60 mL/min signify possible Chronic Kidney Disease.    Anion gap 11 5 - 15  Hepatic function panel     Status: None    Collection Time: 04/19/15  2:08 AM  Result Value Ref Range   Total Protein 6.7 6.5 - 8.1 g/dL   Albumin 3.7 3.5 - 5.0 g/dL   AST 26 15 - 41 U/L   ALT 16 14 - 54 U/L   Alkaline Phosphatase 93 38 - 126 U/L   Total Bilirubin NOT DONE 0.3 - 1.2 mg/dL    Comment: QUANTITY NOT SUFFICIENT, UNABLE TO PERFORM TEST   Bilirubin, Direct  0.3 0.1 - 0.5 mg/dL   Indirect Bilirubin NOT CALCULATED 0.3 - 0.9 mg/dL  CBC     Status: Abnormal   Collection Time: 04/19/15  2:08 AM  Result Value Ref Range   WBC 7.8 4.0 - 10.5 K/uL   RBC 4.07 3.87 - 5.11 MIL/uL   Hemoglobin 11.6 (L) 12.0 - 15.0 g/dL   HCT 36.3 36.0 - 46.0 %   MCV 89.2 78.0 - 100.0 fL   MCH 28.5 26.0 - 34.0 pg   MCHC 32.0 30.0 - 36.0 g/dL   RDW 12.8 11.5 - 15.5 %   Platelets 97 (L) 150 - 400 K/uL    Comment: SPECIMEN CHECKED FOR CLOTS REPEATED TO VERIFY PLATELET COUNT CONFIRMED BY SMEAR   Bilirubin, total     Status: None   Collection Time: 04/19/15  6:35 AM  Result Value Ref Range   Total Bilirubin 0.8 0.3 - 1.2 mg/dL  Troponin I     Status: Abnormal   Collection Time: 04/19/15  6:35 AM  Result Value Ref Range   Troponin I 0.04 (H) <0.031 ng/mL    Comment:        PERSISTENTLY INCREASED TROPONIN VALUES IN THE RANGE OF 0.04-0.49 ng/mL CAN BE SEEN IN:       -UNSTABLE ANGINA       -CONGESTIVE HEART FAILURE       -MYOCARDITIS       -CHEST TRAUMA       -ARRYHTHMIAS       -LATE PRESENTING MYOCARDIAL INFARCTION       -COPD   CLINICAL FOLLOW-UP RECOMMENDED.   Influenza panel by PCR (type A & B, H1N1)     Status: None   Collection Time: 04/19/15  9:34 AM  Result Value Ref Range   Influenza A By PCR NEGATIVE NEGATIVE   Influenza B By PCR NEGATIVE NEGATIVE   H1N1 flu by pcr NOT DETECTED NOT DETECTED    Comment:        The Xpert Flu assay (FDA approved for nasal aspirates or washes and nasopharyngeal swab specimens), is intended as an aid in the diagnosis of influenza and should not be used as a sole basis for treatment.     MRSA PCR Screening     Status: Abnormal   Collection Time: 04/19/15 12:22 PM  Result Value Ref Range   MRSA by PCR POSITIVE (A) NEGATIVE    Comment:        The GeneXpert MRSA Assay (FDA approved for NASAL specimens only), is one component of a comprehensive MRSA colonization surveillance program. It is not intended to diagnose MRSA infection nor to guide or monitor treatment for MRSA infections. RESULT CALLED TO, READ BACK BY AND VERIFIED WITH: Kerri Fuel RN 15:25 04/19/15 ([email or web address])   TSH     Status: Abnormal   Collection Time: 04/19/15  6:39 PM  Result Value Ref Range   TSH 0.020 (L) 0.350 - 4.500 uIU/mL  Hemoglobin A1c     Status: None   Collection Time: 04/19/15  6:39 PM  Result Value Ref Range   Hgb A1c MFr Bld 5.2 4.8 - 5.6 %    Comment: (NOTE)         Pre-diabetes: 5.7 - 6.4         Diabetes: >6.4         Glycemic control for adults with diabetes: <7.0    Mean Plasma Glucose 103 mg/dL    Comment: (NOTE) Performed At: [facility] [address]  [place], [place] [id number] Lindon Romp MD [id number]   Troponin I     Status: Abnormal   Collection Time: 04/19/15  6:39 PM  Result Value Ref Range   Troponin I 0.25 (H) <0.031 ng/mL    Comment:        PERSISTENTLY INCREASED TROPONIN VALUES IN THE RANGE OF 0.04-0.49 ng/mL CAN BE SEEN IN:       -UNSTABLE ANGINA       -CONGESTIVE HEART FAILURE       -MYOCARDITIS       -CHEST TRAUMA       -ARRYHTHMIAS       -LATE PRESENTING MYOCARDIAL INFARCTION       -COPD   CLINICAL FOLLOW-UP RECOMMENDED.   Cancer antigen 27.29     Status: Abnormal   Collection Time: 04/19/15  6:39 PM  Result Value Ref Range   CA 27.29 47.9 (H) 0.0 - 38.6 U/mL    Comment: (NOTE) [employer]/ACS methodology Performed At: [facility] [place], [place] [id number] Lindon Romp MD [id number]   Basic metabolic panel     Status: Abnormal   Collection Time: 04/20/15  6:20 AM  Result Value Ref Range    Sodium 143 135 - 145 mmol/L   Potassium 3.6 3.5 - 5.1 mmol/L    Comment: DELTA CHECK NOTED   Chloride 111 101 - 111 mmol/L   CO2 21 (L) 22 - 32 mmol/L   Glucose, Bld 83 65 - 99 mg/dL   BUN 11 6 - 20 mg/dL   Creatinine, Ser 1.10 (H) 0.44 - 1.00 mg/dL   Calcium 8.5 (L) 8.9 - 10.3 mg/dL   GFR calc non Af Amer 53 (L) >60 mL/min   GFR calc Af Amer >60 >60 mL/min    Comment: (NOTE) The eGFR has been calculated using the CKD EPI equation. This calculation has not been validated in all clinical situations. eGFR's persistently <60 mL/min signify possible Chronic Kidney Disease.    Anion gap 11 5 - 15  Troponin I     Status: Abnormal   Collection Time: 04/20/15  6:20 AM  Result Value Ref Range   Troponin I 0.27 (H) <0.031 ng/mL    Comment:        PERSISTENTLY INCREASED TROPONIN VALUES IN THE RANGE OF 0.04-0.49 ng/mL CAN BE SEEN IN:       -UNSTABLE ANGINA       -CONGESTIVE HEART FAILURE       -MYOCARDITIS       -CHEST TRAUMA       -ARRYHTHMIAS       -LATE PRESENTING MYOCARDIAL INFARCTION       -COPD   CLINICAL FOLLOW-UP RECOMMENDED.   Protime-INR     Status: None   Collection Time: 04/20/15  6:20 AM  Result Value Ref Range   Prothrombin Time 15.1 11.6 - 15.2 seconds   INR 1.17 0.00 - 1.49  CBC with Differential/Platelet     Status: Abnormal   Collection Time: 04/20/15  6:20 AM  Result Value Ref Range   WBC 4.1 4.0 - 10.5 K/uL   RBC 3.46 (L) 3.87 - 5.11 MIL/uL   Hemoglobin 10.1 (L) 12.0 - 15.0 g/dL   HCT 31.7 (L) 36.0 - 46.0 %   MCV 91.6 78.0 - 100.0 fL   MCH 29.2 26.0 - 34.0 pg   MCHC 31.9 30.0 - 36.0 g/dL   RDW 13.3 11.5 - 15.5 %   Platelets 90 (L) 150 - 400 K/uL  Comment: CONSISTENT WITH PREVIOUS RESULT   Neutrophils Relative % 52 %   Neutro Abs 2.2 1.7 - 7.7 K/uL   Lymphocytes Relative 34 %   Lymphs Abs 1.4 0.7 - 4.0 K/uL   Monocytes Relative 10 %   Monocytes Absolute 0.4 0.1 - 1.0 K/uL   Eosinophils Relative 3 %   Eosinophils Absolute 0.1 0.0 - 0.7 K/uL    Basophils Relative 1 %   Basophils Absolute 0.0 0.0 - 0.1 K/uL   Dg Chest 2 View  04/19/2015  CLINICAL DATA:  Chest pressure with nausea and vomiting EXAM: CHEST  2 VIEW COMPARISON:  09/30/2014 FINDINGS: Normal heart size and mediastinal contours. No acute infiltrate or edema. No effusion or pneumothorax. No acute osseous findings. IMPRESSION: No evidence of active cardiopulmonary disease. Electronically Signed   By: Monte Fantasia M.D.   On: 04/19/2015 01:15   Ct Chest W Contrast  04/20/2015  CLINICAL DATA:  Breast cancer with osseous metastases EXAM: CT CHEST WITH CONTRAST TECHNIQUE: Multidetector CT imaging of the chest was performed during intravenous contrast administration. CONTRAST:  45m OMNIPAQUE IOHEXOL 300 MG/ML  SOLN COMPARISON:  MRI thoracic spine dated 04/19/2015. PET-CT dated 03/19/2015. CT abdomen pelvis dated 04/19/2015. FINDINGS: Mediastinum/Nodes: The heart is normal in size. No pericardial effusion. Fluid within the superior pericardial recess. No suspicious mediastinal, hilar, or axillary lymphadenopathy. Visualized right thyroid is surgically absent. Lungs/Pleura: New lungs are essentially clear. No suspicious pulmonary nodules.  No focal consolidation. Trace bilateral pleural effusions.  No pneumothorax. Upper abdomen: 2.1 x 3.3 cm hypodense lesion in segment 8 (series 201/image 49), compatible with metastasis when correlating with prior PET-CT. 2-3 additional lesions along the anterior hepatic dome (series 201/images 38, 40, and 41), better evaluated on the prior dedicated CT abdomen/pelvis. Visualized upper abdomen is also notable for small volume perihepatic ascites, postsurgical changes involving the stomach, and cholecystectomy clips. Musculoskeletal: Status post left mastectomy. Degenerative changes of the visualized thoracolumbar spine. No focal osseous lesion is seen. IMPRESSION: Status post left mastectomy. No evidence of metastatic disease in the chest. Trace bilateral  pleural effusions. Multiple hepatic metastases, measuring up to 3.3 cm in segment 8, better characterized on recent enhanced CT abdomen/pelvis. Electronically Signed   By: SJulian HyM.D.   On: 04/20/2015 08:04   Mr BJeri CosWFGContrast  04/19/2015  CLINICAL DATA:  Staging for metastatic breast cancer. EXAM: MRI HEAD WITHOUT AND WITH CONTRAST TECHNIQUE: Multiplanar, multiecho pulse sequences of the brain and surrounding structures were obtained without and with intravenous contrast. CONTRAST:  244mMAGNEVIST GADOPENTETATE DIMEGLUMINE 469.01 MG/ML IV SOLN, 1 MULTIHANCE GADOBENATE DIMEGLUMINE 529 MG/ML IV SOLN COMPARISON:  MRI of the brain May 16, 2014 FINDINGS: The ventricles and sulci are normal for patient's age. No abnormal parenchymal signal, mass lesions, mass effect. No abnormal parenchymal enhancement. No reduced diffusion to suggest acute ischemia or hypercellular tumor. No susceptibility artifact to suggest hemorrhage. A few scattered sub cm supratentorial and pontine white matter T2 hyperintensities are unchanged, compatible with chronic small vessel ischemic disease. Tiny old bilateral basal ganglia perivascular spaces and/or lacunar infarcts are unchanged. No abnormal extra-axial fluid collections. No extra-axial masses nor leptomeningeal enhancement. Normal major intracranial vascular flow voids seen at the skull base. Ocular globes and orbital contents are unremarkable though not tailored for evaluation. No suspicious calvarial bone marrow signal. No abnormal sellar expansion. Craniocervical junction maintained. Mild paranasal sinus mucosal thickening without air-fluid levels. The mastoid air cells appear well-aerated. IMPRESSION: No acute intracranial process nor MR findings  of metastatic disease. Mild chronic small vessel ischemic disease. Electronically Signed   By: Elon Alas M.D.   On: 04/19/2015 23:32   Mr Thoracic Spine W Wo Contrast  04/20/2015  ADDENDUM REPORT:  04/20/2015 00:13 ADDENDUM: Partially imaged pericardial effusion versus fluid in RIGHT fissure, which would be better characterized on CT chest as clinically indicated. Electronically Signed   By: Elon Alas M.D.   On: 04/20/2015 00:13  04/20/2015  CLINICAL DATA:  Staging for metastatic breast cancer. Known liver metastasis. EXAM: MRI THORACIC AND LUMBAR SPINE WITHOUT AND WITH CONTRAST TECHNIQUE: Multiplanar and multiecho pulse sequences of the thoracic and lumbar spine were obtained without and with intravenous contrast. CONTRAST:  103m MAGNEVIST GADOPENTETATE DIMEGLUMINE 469.01 MG/ML IV SOLN, 1 MULTIHANCE GADOBENATE DIMEGLUMINE 529 MG/ML IV SOLN COMPARISON:  PET-CT March 09, 2015 FINDINGS: MR THORACIC SPINE FINDINGS Thoracic vertebral bodies are intact aligned and maintenance of thoracic kyphosis. Scattered chronic Schmorl's nodes. No suspicious STIR signal abnormality to suggest fracture or bony metastasis. Generalized low signal on fat saturation sequences most compatible with osteopenia. No suspicious osseous or intradiscal enhancement. Bulky bridging ventral osteophytes can be seen with DISH. Cervical spinal cord is normal morphology and signal characteristics, conus medullaris terminates at T12. No abnormal cord, leptomeningeal or epidural enhancement. Crescentic fluid signal and RIGHT lung, axial 18/30. Small bilateral pleural effusions. Tiny central T1-2 disc protrusion. Small LEFT central T4-5 disc protrusion. Moderate RIGHT central T6-7 and T7-8 disc protrusions. Axial sequences do not extend caudal to T8-9. No canal stenosis or neural foraminal narrowing at any level. MR LUMBAR SPINE FINDINGS Grade 1 L1-2 retrolisthesis. Lumbar vertebral bodies intact. Expansile bright T2 signal within the L2-3 disc associated with low T1: Enhancing signal within the L2 inferior and L3 superior endplates. Moderate to severe subacute on chronic L1-2 and L3-4 discogenic endplate changes, mild at L4-5. No focal  abnormal enhancement to suggest metastatic disease. Conus medullaris terminates at T12-L1 and appears normal morphology and signal characteristics. Central displacement of the cauda equina due to canal stenosis, cauda equina is otherwise unremarkable. No abnormal cord, leptomeningeal or epidural enhancement. Mild bright interstitial STIR signal within the medial LEFT psoas muscle without focal fluid collection or suspicious enhancement. Moderate to severe relatively symmetric paraspinal muscle atrophy. Level by level evaluation: T12-L1: Small broad-based disc bulge. Mild to moderate facet arthropathy and ligamentum flavum redundancy without canal stenosis or neural foraminal narrowing. L1-2: Retrolisthesis. Moderate broad-based disc osteophyte complex eccentric to the LEFT encroaches upon the exited LEFT L1 nerve. Mild facet arthropathy and ligamentum flavum redundancy. No significant canal stenosis. Mild LEFT neural foraminal narrowing. L2-3: Moderate broad-based disc bulge asymmetric to the RIGHT with suspected RIGHT extra foraminal annular fissure. Severe facet arthropathy and ligamentum flavum redundancy with trace facet effusions which are likely reactive. Moderate canal stenosis, including RIGHT lateral recess effacement which may affect the traversing RIGHT L3 nerve. Mild RIGHT, severe LEFT neural foraminal narrowing. L3-4: Moderate broad-based disc bulge with faint enhancing fissure versus annular calcification. Moderate facet arthropathy and ligamentum flavum redundancy. Moderate canal stenosis. Moderate to severe RIGHT, mild LEFT neural foraminal narrowing. L4-5: Small to moderate broad-based disc bulge asymmetric to the RIGHT. Severe RIGHT, mild LEFT facet arthropathy and ligamentum flavum redundancy without canal stenosis. Severe RIGHT neural foraminal narrowing. L5-S1: Transitional anatomy, partially sacralized L5 vertebral body without disc bulge, canal stenosis. Mild RIGHT neural foraminal narrowing  associated with severe facet arthropathy. IMPRESSION: MRI THORACIC SPINE: No acute osseous process nor MR findings of metastatic disease. Multilevel disc protrusions  without canal stenosis or neural foraminal narrowing. MRI LUMBAR SPINE:  No MR findings of metastatic disease. Severe L2-3 disc edema and enhancing endplate changes, which can be seen with severe inflammatory changes or, osteomyelitis which is less favored. Recommend correlation with ESR/CRP. Grade 1 L1-2 retrolisthesis on degenerative basis. Moderate canal stenosis L2-3 and L3-4. Neural foraminal narrowing at all lumbar levels: Severe on the LEFT at L2-3, severe on the RIGHT at L4-5. Electronically Signed: By: Elon Alas M.D. On: 04/20/2015 00:10   Mr Lumbar Spine W Wo Contrast  04/20/2015  ADDENDUM REPORT: 04/20/2015 00:13 ADDENDUM: Partially imaged pericardial effusion versus fluid in RIGHT fissure, which would be better characterized on CT chest as clinically indicated. Electronically Signed   By: Elon Alas M.D.   On: 04/20/2015 00:13  04/20/2015  CLINICAL DATA:  Staging for metastatic breast cancer. Known liver metastasis. EXAM: MRI THORACIC AND LUMBAR SPINE WITHOUT AND WITH CONTRAST TECHNIQUE: Multiplanar and multiecho pulse sequences of the thoracic and lumbar spine were obtained without and with intravenous contrast. CONTRAST:  70m MAGNEVIST GADOPENTETATE DIMEGLUMINE 469.01 MG/ML IV SOLN, 1 MULTIHANCE GADOBENATE DIMEGLUMINE 529 MG/ML IV SOLN COMPARISON:  PET-CT March 09, 2015 FINDINGS: MR THORACIC SPINE FINDINGS Thoracic vertebral bodies are intact aligned and maintenance of thoracic kyphosis. Scattered chronic Schmorl's nodes. No suspicious STIR signal abnormality to suggest fracture or bony metastasis. Generalized low signal on fat saturation sequences most compatible with osteopenia. No suspicious osseous or intradiscal enhancement. Bulky bridging ventral osteophytes can be seen with DISH. Cervical spinal cord is  normal morphology and signal characteristics, conus medullaris terminates at T12. No abnormal cord, leptomeningeal or epidural enhancement. Crescentic fluid signal and RIGHT lung, axial 18/30. Small bilateral pleural effusions. Tiny central T1-2 disc protrusion. Small LEFT central T4-5 disc protrusion. Moderate RIGHT central T6-7 and T7-8 disc protrusions. Axial sequences do not extend caudal to T8-9. No canal stenosis or neural foraminal narrowing at any level. MR LUMBAR SPINE FINDINGS Grade 1 L1-2 retrolisthesis. Lumbar vertebral bodies intact. Expansile bright T2 signal within the L2-3 disc associated with low T1: Enhancing signal within the L2 inferior and L3 superior endplates. Moderate to severe subacute on chronic L1-2 and L3-4 discogenic endplate changes, mild at L4-5. No focal abnormal enhancement to suggest metastatic disease. Conus medullaris terminates at T12-L1 and appears normal morphology and signal characteristics. Central displacement of the cauda equina due to canal stenosis, cauda equina is otherwise unremarkable. No abnormal cord, leptomeningeal or epidural enhancement. Mild bright interstitial STIR signal within the medial LEFT psoas muscle without focal fluid collection or suspicious enhancement. Moderate to severe relatively symmetric paraspinal muscle atrophy. Level by level evaluation: T12-L1: Small broad-based disc bulge. Mild to moderate facet arthropathy and ligamentum flavum redundancy without canal stenosis or neural foraminal narrowing. L1-2: Retrolisthesis. Moderate broad-based disc osteophyte complex eccentric to the LEFT encroaches upon the exited LEFT L1 nerve. Mild facet arthropathy and ligamentum flavum redundancy. No significant canal stenosis. Mild LEFT neural foraminal narrowing. L2-3: Moderate broad-based disc bulge asymmetric to the RIGHT with suspected RIGHT extra foraminal annular fissure. Severe facet arthropathy and ligamentum flavum redundancy with trace facet effusions  which are likely reactive. Moderate canal stenosis, including RIGHT lateral recess effacement which may affect the traversing RIGHT L3 nerve. Mild RIGHT, severe LEFT neural foraminal narrowing. L3-4: Moderate broad-based disc bulge with faint enhancing fissure versus annular calcification. Moderate facet arthropathy and ligamentum flavum redundancy. Moderate canal stenosis. Moderate to severe RIGHT, mild LEFT neural foraminal narrowing. L4-5: Small to moderate broad-based disc bulge asymmetric  to the RIGHT. Severe RIGHT, mild LEFT facet arthropathy and ligamentum flavum redundancy without canal stenosis. Severe RIGHT neural foraminal narrowing. L5-S1: Transitional anatomy, partially sacralized L5 vertebral body without disc bulge, canal stenosis. Mild RIGHT neural foraminal narrowing associated with severe facet arthropathy. IMPRESSION: MRI THORACIC SPINE: No acute osseous process nor MR findings of metastatic disease. Multilevel disc protrusions without canal stenosis or neural foraminal narrowing. MRI LUMBAR SPINE:  No MR findings of metastatic disease. Severe L2-3 disc edema and enhancing endplate changes, which can be seen with severe inflammatory changes or, osteomyelitis which is less favored. Recommend correlation with ESR/CRP. Grade 1 L1-2 retrolisthesis on degenerative basis. Moderate canal stenosis L2-3 and L3-4. Neural foraminal narrowing at all lumbar levels: Severe on the LEFT at L2-3, severe on the RIGHT at L4-5. Electronically Signed: By: Elon Alas M.D. On: 04/20/2015 00:10   Ct Abdomen Pelvis W Contrast  04/19/2015  CLINICAL DATA:  59 year old female with abdominal pain and vomiting. History of small-bowel obstruction. EXAM: CT ABDOMEN AND PELVIS WITH CONTRAST TECHNIQUE: Multidetector CT imaging of the abdomen and pelvis was performed using the standard protocol following bolus administration of intravenous contrast. CONTRAST:  71m OMNIPAQUE IOHEXOL 300 MG/ML  SOLN COMPARISON:  CT dated  07/11/2014 FINDINGS: The visualized lung bases are clear. No intra-abdominal free air or free fluid. Partially visualized right cardiophrenic fluid density may represent a pericardial cyst. Partially visualized right breast implant. A thin low attenuating fluid collection noted in the superior aspect of the liver under the diaphragm which may represent a subcapsular collection or implants. There is a focal area low-attenuation in the left lobe of the liver along the falciform ligament measuring approximately 3.6 x 2.4 cm. This area appears increased in size compared to the prior study. Multiple small hypodense lesion noted at the dome of the liver measuring up to 1 cm. These lesions appear new compared to the prior study and are concerning for metastatic disease. Switch Create a 3 cholecystectomy. Dilated at the common bile duct, likely post cholecystectomy. The pancreas there is, spleen, and adrenal glands appear unremarkable. There is a 5 mm nonobstructing left renal inferior pole calculus. There is mild bilateral renal cortical atrophy. There is an extrarenal pelvis on the right. No hydronephrosis. The visualized ureters and urinary bladder appear unremarkable. The uterus is grossly unremarkable. 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 No evidence of bowel obstruction. Moderate stool there is noted throughout the colon. No bowel inflammation. Normal appendix. The abdominal aorta and IVC appear patent. No portal venous gas identified. There is no adenopathy. Multiple small peritoneal defects noted in the upper anterior abdominal wall in the midline. There is focal area of protrusion and abutment of a loop of small bowel compatible with adhesions. No evidence of bowel obstruction. There is degenerative changes of the spine. There are 6 non rib bearing lumbar vertebrae. There is extensive degenerative changes at L2-L3 and L3-L4. Multiple lytic lesions noted at L3 and L4 concerning for metastatic disease.  IMPRESSION: Constipation.  No evidence of bowel obstruction or inflammation. Interval increase in the size of the hypo attenuating lesion along the falciform ligament in the left lobe of the liver. Multiple hypodense lesions in the dome of the liver, new from prior study. Findings compatible with progression of hepatic metastatic disease. Subcapsular fluid collection/ implant over the hepatic dome. Multiple lytic lesion predominantly involving L3 and L4 vertebra concerning for metastatic disease. Electronically Signed   By: AAnner CreteM.D.   On: 04/19/2015 03:02  ROS: General:no colds or fevers,  Weight is up 7 lbs from 04/08/15 but no SOB Skin:no rashes or ulcers HEENT:no blurred vision, no congestion CV:see HPI PUL:see HPI GI:no diarrhea constipation or Vasquez, no indigestion GU:no hematuria, no dysuria MS:no joint pain, no claudication Neuro:no syncope, no lightheadedness Endo:+ diabetes, no thyroid disease   Blood pressure 149/66, pulse 54, temperature 98 F (36.7 C), temperature source Oral, resp. rate 23, height '5\' 6"'  (1.676 m), weight 225 lb 8.5 oz (102.3 kg), last menstrual period 08/30/2006, SpO2 100 %.  Wt Readings from Last 3 Encounters:  04/20/15 225 lb 8.5 oz (102.3 kg)  04/08/15 218 lb (98.884 kg)  03/18/15 218 lb (98.884 kg)    PE: General:Pleasant affect, NAD- feels back to her normal now, no further vomiting or chest pain. Skin:Warm and dry, brisk capillary refill HEENT:normocephalic, sclera clear, mucus membranes moist Neck:supple, no JVD, no bruits  Heart:S1S2 RRR without murmur, gallup, rub or click Lungs:clear without rales, rhonchi, or wheezes EPP:IRJJ, non tender, + BS, do not palpate liver spleen or masses Ext:no lower ext edema, 2+ pedal pulses, 2+ radial pulses Neuro:alert and oriented X 3, MAE, follows commands, + facial symmetry    Assessment/Plan Principal Problem:   Intractable nausea and vomiting Active Problems:   Breast cancer metastasized  to liver [facility])   Essential hypertension   Hypothyroidism   Chest pain   Chronic pain syndrome   Chronic diastolic heart failure (HCC)   Diabetes mellitus ([place])   Acute kidney injury superimposed on chronic kidney disease (HCC)   Chest discomfort and elevated troponin.  No further pain.  This may be demand ischemia from intractable vomiting, with recent cardiac cath with LAD distal tubular 30-40% stenosis, RCA with 20-30% mid stenosis.  Otherwise minimal luminal irregularities.  MD to see. Echo ordered for EF follow up and prior to further chemo.   Today's EKG is abnormal will recheck tomorrow. Pt stated leads were placed on her Rt side not the Left.  [place]  Nurse Practitioner Certified [place] Pager [phone] or after 5pm or weekends call [phone] 04/20/2015, 3:00 PM     Patient seen and examined and history reviewed. Agree with above findings and plan. 59 yo WF with metastatic breast CA to liver admitted with refractory nausea and vomiting. Troponins mildly elevated peak 0.27. No new Ecg changes. Denies any real chest pain. Prior history of stress induced cardiomyopathy in March with subsequent improvement in EF to 55-60%. Repeat Echo pending. I think troponin elevation is probably stress related due to other acute medical illness. Plan to proceed with [facility] and chemo pending results.   Of note Ecg done today at 6:27 is ERRONEOUS. Precordial leads placed on right chest instead of left.   Akiva Josey [place], [place] 04/20/2015 4:13 PM

## 2015-04-21 ENCOUNTER — Encounter (HOSPITAL_COMMUNITY): Payer: Self-pay | Admitting: Radiology

## 2015-04-21 ENCOUNTER — Observation Stay (HOSPITAL_BASED_OUTPATIENT_CLINIC_OR_DEPARTMENT_OTHER): Payer: Medicare Other

## 2015-04-21 ENCOUNTER — Observation Stay (HOSPITAL_COMMUNITY): Payer: Medicare Other

## 2015-04-21 DIAGNOSIS — C787 Secondary malignant neoplasm of liver and intrahepatic bile duct: Secondary | ICD-10-CM | POA: Diagnosis not present

## 2015-04-21 DIAGNOSIS — C7951 Secondary malignant neoplasm of bone: Secondary | ICD-10-CM | POA: Diagnosis not present

## 2015-04-21 DIAGNOSIS — Z5111 Encounter for antineoplastic chemotherapy: Secondary | ICD-10-CM

## 2015-04-21 DIAGNOSIS — R1110 Vomiting, unspecified: Secondary | ICD-10-CM | POA: Diagnosis not present

## 2015-04-21 DIAGNOSIS — C50919 Malignant neoplasm of unspecified site of unspecified female breast: Secondary | ICD-10-CM | POA: Diagnosis not present

## 2015-04-21 DIAGNOSIS — I10 Essential (primary) hypertension: Secondary | ICD-10-CM

## 2015-04-21 DIAGNOSIS — G894 Chronic pain syndrome: Secondary | ICD-10-CM | POA: Diagnosis not present

## 2015-04-21 DIAGNOSIS — R0789 Other chest pain: Secondary | ICD-10-CM | POA: Diagnosis not present

## 2015-04-21 DIAGNOSIS — N179 Acute kidney failure, unspecified: Secondary | ICD-10-CM | POA: Diagnosis not present

## 2015-04-21 DIAGNOSIS — R7989 Other specified abnormal findings of blood chemistry: Secondary | ICD-10-CM | POA: Diagnosis not present

## 2015-04-21 DIAGNOSIS — K529 Noninfective gastroenteritis and colitis, unspecified: Secondary | ICD-10-CM | POA: Diagnosis not present

## 2015-04-21 LAB — COMPREHENSIVE METABOLIC PANEL
ALK PHOS: 60 U/L (ref 38–126)
ALT: 12 U/L — AB (ref 14–54)
AST: 17 U/L (ref 15–41)
Albumin: 2.6 g/dL — ABNORMAL LOW (ref 3.5–5.0)
Anion gap: 8 (ref 5–15)
BUN: 10 mg/dL (ref 6–20)
CALCIUM: 7.9 mg/dL — AB (ref 8.9–10.3)
CO2: 24 mmol/L (ref 22–32)
CREATININE: 1.15 mg/dL — AB (ref 0.44–1.00)
Chloride: 111 mmol/L (ref 101–111)
GFR, EST AFRICAN AMERICAN: 59 mL/min — AB (ref >60)
GFR, EST NON AFRICAN AMERICAN: 51 mL/min — AB (ref >60)
Glucose, Bld: 78 mg/dL (ref 65–99)
Potassium: 3.3 mmol/L — ABNORMAL LOW (ref 3.5–5.1)
Sodium: 143 mmol/L (ref 135–145)
TOTAL PROTEIN: 5.3 g/dL — AB (ref 6.5–8.1)
Total Bilirubin: 0.8 mg/dL (ref 0.3–1.2)

## 2015-04-21 LAB — CBC WITH DIFFERENTIAL/PLATELET
BASOS ABS: 0 10*3/uL (ref 0.0–0.1)
BASOS PCT: 1 %
EOS ABS: 0.1 10*3/uL (ref 0.0–0.7)
Eosinophils Relative: 4 %
HCT: 28 % — ABNORMAL LOW (ref 36.0–46.0)
HEMOGLOBIN: 9 g/dL — AB (ref 12.0–15.0)
Lymphocytes Relative: 30 %
Lymphs Abs: 1 10*3/uL (ref 0.7–4.0)
MCH: 29.1 pg (ref 26.0–34.0)
MCHC: 32.1 g/dL (ref 30.0–36.0)
MCV: 90.6 fL (ref 78.0–100.0)
Monocytes Absolute: 0.3 10*3/uL (ref 0.1–1.0)
Monocytes Relative: 10 %
NEUTROS PCT: 56 %
Neutro Abs: 1.9 10*3/uL (ref 1.7–7.7)
Platelets: 76 10*3/uL — ABNORMAL LOW (ref 150–400)
RBC: 3.09 MIL/uL — AB (ref 3.87–5.11)
RDW: 13.2 % (ref 11.5–15.5)
WBC: 3.5 10*3/uL — AB (ref 4.0–10.5)

## 2015-04-21 MED ORDER — POTASSIUM CHLORIDE CRYS ER 20 MEQ PO TBCR
40.0000 meq | EXTENDED_RELEASE_TABLET | Freq: Once | ORAL | Status: AC
  Administered 2015-04-21: 40 meq via ORAL
  Filled 2015-04-21: qty 2

## 2015-04-21 MED ORDER — PANTOPRAZOLE SODIUM 40 MG PO TBEC: 40.0000 mg | DELAYED_RELEASE_TABLET | Freq: Every day | ORAL | Status: DC

## 2015-04-21 MED ORDER — CEFAZOLIN SODIUM-DEXTROSE 2-3 GM-% IV SOLR
INTRAVENOUS | Status: AC
  Administered 2015-04-21: 15:00:00
  Filled 2015-04-21: qty 50

## 2015-04-21 MED ORDER — MIDAZOLAM HCL 2 MG/2ML IJ SOLN
INTRAMUSCULAR | Status: AC | PRN
  Administered 2015-04-21 (×3): 1 mg via INTRAVENOUS

## 2015-04-21 MED ORDER — ONDANSETRON HCL 8 MG PO TABS: 8.0000 mg | ORAL_TABLET | Freq: Three times a day (TID) | ORAL | Status: DC | PRN

## 2015-04-21 MED ORDER — SENNA 8.6 MG PO TABS: 2.0000 | ORAL_TABLET | Freq: Every day | ORAL | Status: DC

## 2015-04-21 MED ORDER — FENTANYL CITRATE (PF) 100 MCG/2ML IJ SOLN
INTRAMUSCULAR | Status: AC | PRN
  Administered 2015-04-21 (×3): 50 ug via INTRAVENOUS

## 2015-04-21 MED ORDER — LIDOCAINE-EPINEPHRINE (PF) 1 %-1:200000 IJ SOLN
INTRAMUSCULAR | Status: AC
  Filled 2015-04-21: qty 30

## 2015-04-21 MED ORDER — MIDAZOLAM HCL 2 MG/2ML IJ SOLN
INTRAMUSCULAR | Status: AC
  Filled 2015-04-21: qty 6

## 2015-04-21 MED ORDER — POLYETHYLENE GLYCOL 3350 17 G PO PACK: 17.0000 g | PACK | Freq: Two times a day (BID) | ORAL | Status: DC

## 2015-04-21 MED ORDER — HEPARIN SOD (PORK) LOCK FLUSH 100 UNIT/ML IV SOLN
INTRAVENOUS | Status: AC
  Filled 2015-04-21: qty 5

## 2015-04-21 MED ORDER — OXYCODONE HCL 5 MG PO CAPS: 10.0000 mg | ORAL_CAPSULE | ORAL | Status: DC

## 2015-04-21 MED ORDER — LEVOTHYROXINE SODIUM 150 MCG PO TABS: 150.0000 ug | ORAL_TABLET | Freq: Every day | ORAL | Status: DC

## 2015-04-21 MED ORDER — FENTANYL CITRATE (PF) 100 MCG/2ML IJ SOLN
INTRAMUSCULAR | Status: AC
  Filled 2015-04-21: qty 4

## 2015-04-21 NOTE — Progress Notes (Signed)
Patient Name: Kerri Vasquez Date of Encounter: 04/21/2015  Principal Problem:   Intractable nausea and vomiting Active Problems:   Breast cancer metastasized to liver [facility])   Essential hypertension   Hypothyroidism   Chest pain   Chronic pain syndrome   Chronic diastolic heart failure (HCC)   Diabetes mellitus ([place])   Acute kidney injury superimposed on chronic kidney disease (HCC)   Elevated troponin   Chest discomfort    Primary Cardiologist: Dr. Haroldine Laws Patient Profile: 59 yo female w/ PMH of Takotsubo Cardiomyopathy (EF 20-25% in 06/2014, improved to 55-60% on 08/27/2014), Type 2 DM, CKD, metastatic breast cancer to the liver, and HTN admitted on 04/19/2015 for chest pain in the setting of nausea and vomiting. Troponin values 0.04, 0.25, and 0.27 this admission.  SUBJECTIVE: Denies any recurrent chest pain. Reports significant improvement in her nausea and vomiting.  OBJECTIVE Filed Vitals:   04/20/15 1418 04/20/15 2158 04/21/15 0541 04/21/15 0548  BP: 149/66 126/58 130/66   Pulse: 54 59 60   Temp: 98 F (36.7 C) 98.1 F (36.7 C) 98.4 F (36.9 C)   TempSrc: Oral Oral Oral   Resp: _0 Height:      Weight:    225 lb 12 oz (102.4 kg)  SpO2: 100% 67% 95%     Intake/Output Summary (Last 24 hours) at 04/21/15 0814 Last data filed at 04/20/15 2158  Gross per 24 hour  Intake   1020 ml  Output      0 ml  Net   1020 ml   Filed Weights   04/19/15 0927 04/20/15 0635 04/21/15 0548  Weight: 226 lb (102.513 kg) 225 lb 8.5 oz (102.3 kg) 225 lb 12 oz (102.4 kg)    PHYSICAL EXAM General: Well developed, well nourished, female in no acute distress. Head: Normocephalic, atraumatic.  Neck: Supple without bruits, JVD not elevated. Lungs:  Resp regular and unlabored, CTA without wheezing or rales. Heart: RRR, S1, S2, no S3, S4, or murmur; no rub. Abdomen: Soft, non-tender, non-distended with normoactive bowel sounds. No hepatomegaly. No rebound/guarding. No  obvious abdominal masses. Extremities: No clubbing, cyanosis, or edema. Distal pedal pulses are 2+ bilaterally. Neuro: Alert and oriented X 3. Moves all extremities spontaneously. Psych: Normal affect.  LABS: CBC: Recent Labs  04/20/15 0620 04/21/15 0530  WBC 4.1 3.5*  NEUTROABS 2.2 1.9  HGB 10.1* 9.0*  HCT 31.7* 28.0*  MCV 91.6 90.6  PLT 90* 76*   INR: Recent Labs  04/20/15 0620  INR 4.33   Basic Metabolic Panel: Recent Labs  04/19/15 0034 04/20/15 0620  NA 142 143  K 4.5 3.6  CL 111 111  CO2 20* 21*  GLUCOSE 136* 83  BUN 14 11  CREATININE 1.29* 1.10*  CALCIUM 9.2 8.5*   Liver Function Tests: Recent Labs  04/19/15 0208 04/19/15 0635  AST 26  --   ALT 16  --   ALKPHOS 93  --   BILITOT NOT DONE 0.8  PROT 6.7  --   ALBUMIN 3.7  --    Cardiac Enzymes: Recent Labs  04/19/15 0635 04/19/15 1839 04/20/15 0620  TROPONINI 0.04* 0.25* 0.27*    Recent Labs  04/19/15 0022  TROPIPOC 0.07   Hemoglobin A1C: Recent Labs  04/19/15 1839  HGBA1C 5.2   Thyroid Function Tests: Recent Labs  04/19/15 1839  TSH 0.020*    TELE:   Not connected.     ECG: NSR, rate in 60's. TWI in  anterolateral leads resolved.  ECHO: Pending  Radiology/Studies: Ct Chest W Contrast: 04/20/2015  CLINICAL DATA:  Breast cancer with osseous metastases EXAM: CT CHEST WITH CONTRAST TECHNIQUE: Multidetector CT imaging of the chest was performed during intravenous contrast administration. CONTRAST:  46m OMNIPAQUE IOHEXOL 300 MG/ML  SOLN COMPARISON:  MRI thoracic spine dated 04/19/2015. PET-CT dated 03/19/2015. CT abdomen pelvis dated 04/19/2015. FINDINGS: Mediastinum/Nodes: The heart is normal in size. No pericardial effusion. Fluid within the superior pericardial recess. No suspicious mediastinal, hilar, or axillary lymphadenopathy. Visualized right thyroid is surgically absent. Lungs/Pleura: New lungs are essentially clear. No suspicious pulmonary nodules.  No focal consolidation.  Trace bilateral pleural effusions.  No pneumothorax. Upper abdomen: 2.1 x 3.3 cm hypodense lesion in segment 8 (series 201/image 49), compatible with metastasis when correlating with prior PET-CT. 2-3 additional lesions along the anterior hepatic dome (series 201/images 38, 40, and 41), better evaluated on the prior dedicated CT abdomen/pelvis. Visualized upper abdomen is also notable for small volume perihepatic ascites, postsurgical changes involving the stomach, and cholecystectomy clips. Musculoskeletal: Status post left mastectomy. Degenerative changes of the visualized thoracolumbar spine. No focal osseous lesion is seen. IMPRESSION: Status post left mastectomy. No evidence of metastatic disease in the chest. Trace bilateral pleural effusions. Multiple hepatic metastases, measuring up to 3.3 cm in segment 8, better characterized on recent enhanced CT abdomen/pelvis. Electronically Signed   By: SJulian HyM.D.   On: 04/20/2015 08:04   Mr BJeri CosWFMContrast: 04/19/2015  CLINICAL DATA:  Staging for metastatic breast cancer. EXAM: MRI HEAD WITHOUT AND WITH CONTRAST TECHNIQUE: Multiplanar, multiecho pulse sequences of the brain and surrounding structures were obtained without and with intravenous contrast. CONTRAST:  281mMAGNEVIST GADOPENTETATE DIMEGLUMINE 469.01 MG/ML IV SOLN, 1 MULTIHANCE GADOBENATE DIMEGLUMINE 529 MG/ML IV SOLN COMPARISON:  MRI of the brain May 16, 2014 FINDINGS: The ventricles and sulci are normal for patient's age. No abnormal parenchymal signal, mass lesions, mass effect. No abnormal parenchymal enhancement. No reduced diffusion to suggest acute ischemia or hypercellular tumor. No susceptibility artifact to suggest hemorrhage. A few scattered sub cm supratentorial and pontine white matter T2 hyperintensities are unchanged, compatible with chronic small vessel ischemic disease. Tiny old bilateral basal ganglia perivascular spaces and/or lacunar infarcts are unchanged. No  abnormal extra-axial fluid collections. No extra-axial masses nor leptomeningeal enhancement. Normal major intracranial vascular flow voids seen at the skull base. Ocular globes and orbital contents are unremarkable though not tailored for evaluation. No suspicious calvarial bone marrow signal. No abnormal sellar expansion. Craniocervical junction maintained. Mild paranasal sinus mucosal thickening without air-fluid levels. The mastoid air cells appear well-aerated. IMPRESSION: No acute intracranial process nor MR findings of metastatic disease. Mild chronic small vessel ischemic disease. Electronically Signed   By: CoElon Alas.D.   On: 04/19/2015 23:32   Mr Thoracic Spine W Wo Contrast: 04/20/2015  ADDENDUM REPORT: 04/20/2015 00:13 ADDENDUM: Partially imaged pericardial effusion versus fluid in RIGHT fissure, which would be better characterized on CT chest as clinically indicated. Electronically Signed   By: CoElon Alas.D.   On: 04/20/2015 00:13  04/20/2015  CLINICAL DATA:  Staging for metastatic breast cancer. Known liver metastasis. EXAM: MRI THORACIC AND LUMBAR SPINE WITHOUT AND WITH CONTRAST TECHNIQUE: Multiplanar and multiecho pulse sequences of the thoracic and lumbar spine were obtained without and with intravenous contrast. CONTRAST:  2045mAGNEVIST GADOPENTETATE DIMEGLUMINE 469.01 MG/ML IV SOLN, 1 MULTIHANCE GADOBENATE DIMEGLUMINE 529 MG/ML IV SOLN COMPARISON:  PET-CT March 09, 2015 FINDINGS: MR THORACIC SPINE FINDINGS  Thoracic vertebral bodies are intact aligned and maintenance of thoracic kyphosis. Scattered chronic Schmorl's nodes. No suspicious STIR signal abnormality to suggest fracture or bony metastasis. Generalized low signal on fat saturation sequences most compatible with osteopenia. No suspicious osseous or intradiscal enhancement. Bulky bridging ventral osteophytes can be seen with DISH. Cervical spinal cord is normal morphology and signal characteristics, conus medullaris  terminates at T12. No abnormal cord, leptomeningeal or epidural enhancement. Crescentic fluid signal and RIGHT lung, axial 18/30. Small bilateral pleural effusions. Tiny central T1-2 disc protrusion. Small LEFT central T4-5 disc protrusion. Moderate RIGHT central T6-7 and T7-8 disc protrusions. Axial sequences do not extend caudal to T8-9. No canal stenosis or neural foraminal narrowing at any level. MR LUMBAR SPINE FINDINGS Grade 1 L1-2 retrolisthesis. Lumbar vertebral bodies intact. Expansile bright T2 signal within the L2-3 disc associated with low T1: Enhancing signal within the L2 inferior and L3 superior endplates. Moderate to severe subacute on chronic L1-2 and L3-4 discogenic endplate changes, mild at L4-5. No focal abnormal enhancement to suggest metastatic disease. Conus medullaris terminates at T12-L1 and appears normal morphology and signal characteristics. Central displacement of the cauda equina due to canal stenosis, cauda equina is otherwise unremarkable. No abnormal cord, leptomeningeal or epidural enhancement. Mild bright interstitial STIR signal within the medial LEFT psoas muscle without focal fluid collection or suspicious enhancement. Moderate to severe relatively symmetric paraspinal muscle atrophy. Level by level evaluation: T12-L1: Small broad-based disc bulge. Mild to moderate facet arthropathy and ligamentum flavum redundancy without canal stenosis or neural foraminal narrowing. L1-2: Retrolisthesis. Moderate broad-based disc osteophyte complex eccentric to the LEFT encroaches upon the exited LEFT L1 nerve. Mild facet arthropathy and ligamentum flavum redundancy. No significant canal stenosis. Mild LEFT neural foraminal narrowing. L2-3: Moderate broad-based disc bulge asymmetric to the RIGHT with suspected RIGHT extra foraminal annular fissure. Severe facet arthropathy and ligamentum flavum redundancy with trace facet effusions which are likely reactive. Moderate canal stenosis, including  RIGHT lateral recess effacement which may affect the traversing RIGHT L3 nerve. Mild RIGHT, severe LEFT neural foraminal narrowing. L3-4: Moderate broad-based disc bulge with faint enhancing fissure versus annular calcification. Moderate facet arthropathy and ligamentum flavum redundancy. Moderate canal stenosis. Moderate to severe RIGHT, mild LEFT neural foraminal narrowing. L4-5: Small to moderate broad-based disc bulge asymmetric to the RIGHT. Severe RIGHT, mild LEFT facet arthropathy and ligamentum flavum redundancy without canal stenosis. Severe RIGHT neural foraminal narrowing. L5-S1: Transitional anatomy, partially sacralized L5 vertebral body without disc bulge, canal stenosis. Mild RIGHT neural foraminal narrowing associated with severe facet arthropathy. IMPRESSION: MRI THORACIC SPINE: No acute osseous process nor MR findings of metastatic disease. Multilevel disc protrusions without canal stenosis or neural foraminal narrowing. MRI LUMBAR SPINE:  No MR findings of metastatic disease. Severe L2-3 disc edema and enhancing endplate changes, which can be seen with severe inflammatory changes or, osteomyelitis which is less favored. Recommend correlation with ESR/CRP. Grade 1 L1-2 retrolisthesis on degenerative basis. Moderate canal stenosis L2-3 and L3-4. Neural foraminal narrowing at all lumbar levels: Severe on the LEFT at L2-3, severe on the RIGHT at L4-5. Electronically Signed: By: Elon Alas M.D. On: 04/20/2015 00:10     Current Medications:  . carvedilol  6.25 mg Oral BID WC  . Chlorhexidine Gluconate Cloth  6 each Topical Q0600  . FLUoxetine  40 mg Oral QAC breakfast  . heparin  5,000 Units Subcutaneous 3 times per day  . levothyroxine  150 mcg Oral QAC breakfast  . methocarbamol  750 mg Oral  QID  . morphine  45 mg Oral Q12H  . mupirocin ointment  1 application Nasal BID  . pantoprazole  40 mg Oral Q1200  . polyethylene glycol  17 g Oral BID  . pregabalin  200 mg Oral TID  .  senna  2 tablet Oral QHS      ASSESSMENT AND PLAN:  1. Chest Discomfort - occurring in the setting of intractable vomiting. Cyclic troponin values have been  0.04, 0.25, and 0.27. - had recent cath in 06/2014 showing LAD with distal tubular 30-40% stenosis and RCA with 20-30% mid stenosis. - Her EKG on 04/20/2015 showed new TWI in the anterolateral leads however the leads were on the right side of her chest and not the left. - Repeat EKG today shows NSR with no TWI.  - Echo is pending (called [facility] [place] and they will do it this morning). Will need cardiac clearance prior to [facility] Cath placement due to it being performed under sedation.  2. History of Takotsubo Cardiomyopathy  - EF 20-25% in 06/2014, improved to 55-60% on 08/27/2014 - repeat echo pending   Signed, Erma Heritage , PA-C 8:14 AM 04/21/2015 Pager: [phone] Patient seen and examined and history reviewed. Agree with above findings and plan. Feeling well. Going for [employer] today. Follow up Ecg is normal. Echo shows normal LV function with EF 55-60%. No further work up needed from a cardiac standpoint. If potentially cardiotoxic chemotherapy to be used will need follow up with Dr. Haroldine Laws in [place] clinic with serial Echos.    Aniyia Rane [place], [place] 04/21/2015 1:56 PM

## 2015-04-21 NOTE — Sedation Documentation (Signed)
Successful placement port-a-cath R chest

## 2015-04-21 NOTE — Discharge Summary (Addendum)
PATIENT DETAILS Name: Kerri Vasquez Age: 59 y.o. Sex: female Date of Birth: Sep 20, 1955 MRN: [id number]. Admitting Physician: Waldemar Dickens, MD BPZ:WCHE, Dola Factor, MD  Admit Date: 04/19/2015 Discharge date: 04/21/2015  Recommendations for Outpatient Follow-up:  1. Please repeat CBC/BMET at next visit 2. Has L2-L3 disc edema seen on MRI  L Spine-?etiology-CRP neg-blood cultures neg so far. If back pain worsens-may need a biopsy of the area 3. Please follow blood cultures till final 4. Keep on bowel regimen-for constipation 5. Repeat TSH in 3 months-dose of synthroid decreased  PRIMARY DISCHARGE DIAGNOSIS:  Principal Problem:   Intractable nausea and vomiting Active Problems:   Breast cancer metastasized to liver [facility])   Essential hypertension   Hypothyroidism   Chest pain   Chronic pain syndrome   Chronic diastolic heart failure (HCC)   Diabetes mellitus ([place])   Acute kidney injury superimposed on chronic kidney disease (HCC)   Elevated troponin   Chest discomfort      PAST MEDICAL HISTORY: Past Medical History  Diagnosis Date  . Depression   . Hypertension   . Hypothyroidism   . Incisional hernia   . Spinal stenosis   . Scoliosis   . Sciatica   . Intestinal obstruction ([place])   . GERD (gastroesophageal reflux disease)   . Septic arthritis ([place]) 01/28/10    and spinal stenosis , moderate scoliosis   . Osteoarthritis of multiple joints   . Sleep apnea     hx of off machine x 4 years   . Renal insufficiency     hx renal failure 2011  . Fibromyalgia   . Fracture of multiple toes     right toes x 4 toes- excluding small toe  . Diabetes mellitus     type II, controlled by diet 05/25/11   . CHF (congestive heart failure) ([place])   . Anemia   . Pernicious anemia 05/30/2011    Iron transfusion and VB12 on 06/06/11  . Migraine     "weekly; but they don't last long" (08/26/2014)  . Chronic lower back pain   . Breast cancer, right breast ([place])   . Liver  cancer ([place])     "twice"  . Thyroid cancer ([place])   . Myocardial infarction ([place]) 06/2014    S/P percutaneous intervention procedure a metastatic lesion in the liver  . Nausea and vomiting 04/2015    DISCHARGE MEDICATIONS: Current Discharge Medication List    START taking these medications   Details  pantoprazole (PROTONIX) 40 MG tablet Take 1 tablet (40 mg total) by mouth daily at 12 noon. Qty: 60 tablet, Refills: 0    polyethylene glycol (MIRALAX / GLYCOLAX) packet Take 17 g by mouth 2 (two) times daily. Qty: 14 each, Refills: 0    senna (SENOKOT) 8.6 MG TABS tablet Take 2 tablets (17.2 mg total) by mouth at bedtime. Qty: 120 each, Refills: 0      CONTINUE these medications which have CHANGED   Details  levothyroxine (SYNTHROID, LEVOTHROID) 150 MCG tablet Take 1 tablet (150 mcg total) by mouth daily. Qty: 60 tablet, Refills: 0    ondansetron (ZOFRAN) 8 MG tablet Take 1 tablet (8 mg total) by mouth every 8 (eight) hours as needed for nausea or vomiting. Qty: 60 tablet, Refills: 0   Associated Diagnoses: Breast cancer metastasized to liver, unspecified laterality (HCC)    oxycodone (OXY-IR) 5 MG capsule Take 2-4 capsules (10-20 mg total) by mouth every 4 (four) hours. Qty: 60 capsule, Refills: 0  CONTINUE these medications which have NOT CHANGED   Details  carvedilol (COREG) 6.25 MG tablet Take 6.25 mg by mouth 2 (two) times daily with a meal.   Associated Diagnoses: Breast cancer metastasized to liver, unspecified laterality ([place]); Breast cancer metastasized to liver, right ([place]); Pernicious anemia; Iron deficiency anemia due to chronic blood loss    cyanocobalamin (,VITAMIN B-12,) 1000 MCG/ML injection INJECT 1 ML AS DIRECTED EVERY 21 DAYS Qty: 4 mL, Refills: 3    everolimus (AFINITOR) 5 MG tablet Take 1 tablet (5 mg total) by mouth daily. Qty: 30 tablet, Refills: 4   Associated Diagnoses: Breast cancer metastasized to liver, unspecified laterality ([place]); Breast  cancer metastasized to liver, right ([place]); Pernicious anemia; Iron deficiency anemia due to chronic blood loss    FLUoxetine (PROZAC) 40 MG capsule Take 40 mg by mouth daily before breakfast.     fulvestrant (FASLODEX) 250 MG/5ML injection Inject 250 mg into the muscle every 30 (thirty) days.    furosemide (LASIX) 20 MG tablet Take 60 mg by mouth daily.    Associated Diagnoses: Breast cancer metastasized to liver, unspecified laterality ([place]); Breast cancer metastasized to liver, right ([place]); Pernicious anemia; Iron deficiency anemia due to chronic blood loss    LORazepam (ATIVAN) 0.5 MG tablet PLACE 1 TABLET BY MOUTH UNDER THE TONGUE EVERY 6 HOURS AS NEEDED FOR NAUSEA Qty: 60 tablet, Refills: 1   Associated Diagnoses: Breast cancer metastasized to liver, unspecified laterality ([place]); Breast cancer metastasized to liver, right ([place]); Pernicious anemia; Iron deficiency anemia due to chronic blood loss    methocarbamol (ROBAXIN) 750 MG tablet Take 750 mg by mouth 4 (four) times daily.    Multiple Vitamin (MULTI-VITAMINS) TABS Take 1 tablet by mouth daily.    oxymorphone (OPANA ER) 15 MG 12 hr tablet Take 15 mg by mouth every 12 (twelve) hours.    potassium chloride SA (KLOR-CON M20) 20 MEQ tablet Take 2 tablets (40 mEq total) by mouth 2 (two) times daily. Qty: 120 tablet, Refills: 3    pregabalin (LYRICA) 200 MG capsule Take 200 mg by mouth 3 (three) times daily.    promethazine (PHENERGAN) 25 MG tablet TAKE 1 TABLET BY MOUTH THREE TIMES DAILY AS NEEDED FOR NAUSEA Qty: 90 tablet, Refills: 0    spironolactone (ALDACTONE) 25 MG tablet Take 1 tablet (25 mg total) by mouth daily. Qty: 30 tablet, Refills: 3    temazepam (RESTORIL) 15 MG capsule Take 30 mg by mouth at bedtime.    tiZANidine (ZANAFLEX) 4 MG tablet Take 4 mg by mouth every 6 (six) hours as needed for muscle spasms.    nitroGLYCERIN (NITROSTAT) 0.4 MG SL tablet Place 1 tablet (0.4 mg total) under the tongue every 5 (five)  minutes as needed for chest pain. Qty: 30 tablet, Refills: 12      STOP taking these medications     oxyCODONE-acetaminophen (PERCOCET) 10-325 MG per tablet         ALLERGIES:   Allergies  Allergen Reactions  . Influenza Vac Split [Flu Virus Vaccine] Other (See Comments)    Joint stiffness and renal failure  . Ritalin [Methylphenidate Hcl]     "Heart attack"  . Zostavax [Zoster Vaccine Live ([employer])] Other (See Comments)    Joint stiffness, renal failure  . Adhesive [Tape] Itching and Rash    BRIEF HPI:  See H&P, Labs, Consult and Test reports for all details in brief, Telina Kleckley is a 59 y.o. female with a Past Medical History of Depression,  HTN, Hypothyroidism, spinal stenosis, scoliosis, sciatica, GERD, OSA, CKD, fibromyalgia, DM, anemia, migraines, Breast Cancer w/ metastasis, MI who presented with intractable nausea and vomiting, along with epigastric pain/chest pain  CONSULTATIONS:   cardiology and hematology/oncology  PERTINENT RADIOLOGIC STUDIES: Dg Chest 2 View  04/19/2015  CLINICAL DATA:  Chest pressure with nausea and vomiting EXAM: CHEST  2 VIEW COMPARISON:  09/30/2014 FINDINGS: Normal heart size and mediastinal contours. No acute infiltrate or edema. No effusion or pneumothorax. No acute osseous findings. IMPRESSION: No evidence of active cardiopulmonary disease. Electronically Signed   By: Monte Fantasia M.D.   On: 04/19/2015 01:15   Ct Chest W Contrast  04/20/2015  CLINICAL DATA:  Breast cancer with osseous metastases EXAM: CT CHEST WITH CONTRAST TECHNIQUE: Multidetector CT imaging of the chest was performed during intravenous contrast administration. CONTRAST:  34m OMNIPAQUE IOHEXOL 300 MG/ML  SOLN COMPARISON:  MRI thoracic spine dated 04/19/2015. PET-CT dated 03/19/2015. CT abdomen pelvis dated 04/19/2015. FINDINGS: Mediastinum/Nodes: The heart is normal in size. No pericardial effusion. Fluid within the superior pericardial recess. No suspicious  mediastinal, hilar, or axillary lymphadenopathy. Visualized right thyroid is surgically absent. Lungs/Pleura: New lungs are essentially clear. No suspicious pulmonary nodules.  No focal consolidation. Trace bilateral pleural effusions.  No pneumothorax. Upper abdomen: 2.1 x 3.3 cm hypodense lesion in segment 8 (series 201/image 49), compatible with metastasis when correlating with prior PET-CT. 2-3 additional lesions along the anterior hepatic dome (series 201/images 38, 40, and 41), better evaluated on the prior dedicated CT abdomen/pelvis. Visualized upper abdomen is also notable for small volume perihepatic ascites, postsurgical changes involving the stomach, and cholecystectomy clips. Musculoskeletal: Status post left mastectomy. Degenerative changes of the visualized thoracolumbar spine. No focal osseous lesion is seen. IMPRESSION: Status post left mastectomy. No evidence of metastatic disease in the chest. Trace bilateral pleural effusions. Multiple hepatic metastases, measuring up to 3.3 cm in segment 8, better characterized on recent enhanced CT abdomen/pelvis. Electronically Signed   By: SJulian HyM.D.   On: 04/20/2015 08:04   Mr BJeri CosWPNContrast  04/19/2015  CLINICAL DATA:  Staging for metastatic breast cancer. EXAM: MRI HEAD WITHOUT AND WITH CONTRAST TECHNIQUE: Multiplanar, multiecho pulse sequences of the brain and surrounding structures were obtained without and with intravenous contrast. CONTRAST:  255mMAGNEVIST GADOPENTETATE DIMEGLUMINE 469.01 MG/ML IV SOLN, 1 MULTIHANCE GADOBENATE DIMEGLUMINE 529 MG/ML IV SOLN COMPARISON:  MRI of the brain May 16, 2014 FINDINGS: The ventricles and sulci are normal for patient's age. No abnormal parenchymal signal, mass lesions, mass effect. No abnormal parenchymal enhancement. No reduced diffusion to suggest acute ischemia or hypercellular tumor. No susceptibility artifact to suggest hemorrhage. A few scattered sub cm supratentorial and pontine  white matter T2 hyperintensities are unchanged, compatible with chronic small vessel ischemic disease. Tiny old bilateral basal ganglia perivascular spaces and/or lacunar infarcts are unchanged. No abnormal extra-axial fluid collections. No extra-axial masses nor leptomeningeal enhancement. Normal major intracranial vascular flow voids seen at the skull base. Ocular globes and orbital contents are unremarkable though not tailored for evaluation. No suspicious calvarial bone marrow signal. No abnormal sellar expansion. Craniocervical junction maintained. Mild paranasal sinus mucosal thickening without air-fluid levels. The mastoid air cells appear well-aerated. IMPRESSION: No acute intracranial process nor MR findings of metastatic disease. Mild chronic small vessel ischemic disease. Electronically Signed   By: CoElon Alas.D.   On: 04/19/2015 23:32   Mr Thoracic Spine W Wo Contrast  04/20/2015  ADDENDUM REPORT: 04/20/2015 00:13 ADDENDUM: Partially imaged pericardial  effusion versus fluid in RIGHT fissure, which would be better characterized on CT chest as clinically indicated. Electronically Signed   By: Elon Alas M.D.   On: 04/20/2015 00:13  04/20/2015  CLINICAL DATA:  Staging for metastatic breast cancer. Known liver metastasis. EXAM: MRI THORACIC AND LUMBAR SPINE WITHOUT AND WITH CONTRAST TECHNIQUE: Multiplanar and multiecho pulse sequences of the thoracic and lumbar spine were obtained without and with intravenous contrast. CONTRAST:  49m MAGNEVIST GADOPENTETATE DIMEGLUMINE 469.01 MG/ML IV SOLN, 1 MULTIHANCE GADOBENATE DIMEGLUMINE 529 MG/ML IV SOLN COMPARISON:  PET-CT March 09, 2015 FINDINGS: MR THORACIC SPINE FINDINGS Thoracic vertebral bodies are intact aligned and maintenance of thoracic kyphosis. Scattered chronic Schmorl's nodes. No suspicious STIR signal abnormality to suggest fracture or bony metastasis. Generalized low signal on fat saturation sequences most compatible with  osteopenia. No suspicious osseous or intradiscal enhancement. Bulky bridging ventral osteophytes can be seen with DISH. Cervical spinal cord is normal morphology and signal characteristics, conus medullaris terminates at T12. No abnormal cord, leptomeningeal or epidural enhancement. Crescentic fluid signal and RIGHT lung, axial 18/30. Small bilateral pleural effusions. Tiny central T1-2 disc protrusion. Small LEFT central T4-5 disc protrusion. Moderate RIGHT central T6-7 and T7-8 disc protrusions. Axial sequences do not extend caudal to T8-9. No canal stenosis or neural foraminal narrowing at any level. MR LUMBAR SPINE FINDINGS Grade 1 L1-2 retrolisthesis. Lumbar vertebral bodies intact. Expansile bright T2 signal within the L2-3 disc associated with low T1: Enhancing signal within the L2 inferior and L3 superior endplates. Moderate to severe subacute on chronic L1-2 and L3-4 discogenic endplate changes, mild at L4-5. No focal abnormal enhancement to suggest metastatic disease. Conus medullaris terminates at T12-L1 and appears normal morphology and signal characteristics. Central displacement of the cauda equina due to canal stenosis, cauda equina is otherwise unremarkable. No abnormal cord, leptomeningeal or epidural enhancement. Mild bright interstitial STIR signal within the medial LEFT psoas muscle without focal fluid collection or suspicious enhancement. Moderate to severe relatively symmetric paraspinal muscle atrophy. Level by level evaluation: T12-L1: Small broad-based disc bulge. Mild to moderate facet arthropathy and ligamentum flavum redundancy without canal stenosis or neural foraminal narrowing. L1-2: Retrolisthesis. Moderate broad-based disc osteophyte complex eccentric to the LEFT encroaches upon the exited LEFT L1 nerve. Mild facet arthropathy and ligamentum flavum redundancy. No significant canal stenosis. Mild LEFT neural foraminal narrowing. L2-3: Moderate broad-based disc bulge asymmetric to the  RIGHT with suspected RIGHT extra foraminal annular fissure. Severe facet arthropathy and ligamentum flavum redundancy with trace facet effusions which are likely reactive. Moderate canal stenosis, including RIGHT lateral recess effacement which may affect the traversing RIGHT L3 nerve. Mild RIGHT, severe LEFT neural foraminal narrowing. L3-4: Moderate broad-based disc bulge with faint enhancing fissure versus annular calcification. Moderate facet arthropathy and ligamentum flavum redundancy. Moderate canal stenosis. Moderate to severe RIGHT, mild LEFT neural foraminal narrowing. L4-5: Small to moderate broad-based disc bulge asymmetric to the RIGHT. Severe RIGHT, mild LEFT facet arthropathy and ligamentum flavum redundancy without canal stenosis. Severe RIGHT neural foraminal narrowing. L5-S1: Transitional anatomy, partially sacralized L5 vertebral body without disc bulge, canal stenosis. Mild RIGHT neural foraminal narrowing associated with severe facet arthropathy. IMPRESSION: MRI THORACIC SPINE: No acute osseous process nor MR findings of metastatic disease. Multilevel disc protrusions without canal stenosis or neural foraminal narrowing. MRI LUMBAR SPINE:  No MR findings of metastatic disease. Severe L2-3 disc edema and enhancing endplate changes, which can be seen with severe inflammatory changes or, osteomyelitis which is less favored. Recommend correlation with ESR/CRP.  Grade 1 L1-2 retrolisthesis on degenerative basis. Moderate canal stenosis L2-3 and L3-4. Neural foraminal narrowing at all lumbar levels: Severe on the LEFT at L2-3, severe on the RIGHT at L4-5. Electronically Signed: By: Elon Alas M.D. On: 04/20/2015 00:10   Mr Lumbar Spine W Wo Contrast  04/20/2015  ADDENDUM REPORT: 04/20/2015 00:13 ADDENDUM: Partially imaged pericardial effusion versus fluid in RIGHT fissure, which would be better characterized on CT chest as clinically indicated. Electronically Signed   By: Elon Alas  M.D.   On: 04/20/2015 00:13  04/20/2015  CLINICAL DATA:  Staging for metastatic breast cancer. Known liver metastasis. EXAM: MRI THORACIC AND LUMBAR SPINE WITHOUT AND WITH CONTRAST TECHNIQUE: Multiplanar and multiecho pulse sequences of the thoracic and lumbar spine were obtained without and with intravenous contrast. CONTRAST:  55m MAGNEVIST GADOPENTETATE DIMEGLUMINE 469.01 MG/ML IV SOLN, 1 MULTIHANCE GADOBENATE DIMEGLUMINE 529 MG/ML IV SOLN COMPARISON:  PET-CT March 09, 2015 FINDINGS: MR THORACIC SPINE FINDINGS Thoracic vertebral bodies are intact aligned and maintenance of thoracic kyphosis. Scattered chronic Schmorl's nodes. No suspicious STIR signal abnormality to suggest fracture or bony metastasis. Generalized low signal on fat saturation sequences most compatible with osteopenia. No suspicious osseous or intradiscal enhancement. Bulky bridging ventral osteophytes can be seen with DISH. Cervical spinal cord is normal morphology and signal characteristics, conus medullaris terminates at T12. No abnormal cord, leptomeningeal or epidural enhancement. Crescentic fluid signal and RIGHT lung, axial 18/30. Small bilateral pleural effusions. Tiny central T1-2 disc protrusion. Small LEFT central T4-5 disc protrusion. Moderate RIGHT central T6-7 and T7-8 disc protrusions. Axial sequences do not extend caudal to T8-9. No canal stenosis or neural foraminal narrowing at any level. MR LUMBAR SPINE FINDINGS Grade 1 L1-2 retrolisthesis. Lumbar vertebral bodies intact. Expansile bright T2 signal within the L2-3 disc associated with low T1: Enhancing signal within the L2 inferior and L3 superior endplates. Moderate to severe subacute on chronic L1-2 and L3-4 discogenic endplate changes, mild at L4-5. No focal abnormal enhancement to suggest metastatic disease. Conus medullaris terminates at T12-L1 and appears normal morphology and signal characteristics. Central displacement of the cauda equina due to canal stenosis,  cauda equina is otherwise unremarkable. No abnormal cord, leptomeningeal or epidural enhancement. Mild bright interstitial STIR signal within the medial LEFT psoas muscle without focal fluid collection or suspicious enhancement. Moderate to severe relatively symmetric paraspinal muscle atrophy. Level by level evaluation: T12-L1: Small broad-based disc bulge. Mild to moderate facet arthropathy and ligamentum flavum redundancy without canal stenosis or neural foraminal narrowing. L1-2: Retrolisthesis. Moderate broad-based disc osteophyte complex eccentric to the LEFT encroaches upon the exited LEFT L1 nerve. Mild facet arthropathy and ligamentum flavum redundancy. No significant canal stenosis. Mild LEFT neural foraminal narrowing. L2-3: Moderate broad-based disc bulge asymmetric to the RIGHT with suspected RIGHT extra foraminal annular fissure. Severe facet arthropathy and ligamentum flavum redundancy with trace facet effusions which are likely reactive. Moderate canal stenosis, including RIGHT lateral recess effacement which may affect the traversing RIGHT L3 nerve. Mild RIGHT, severe LEFT neural foraminal narrowing. L3-4: Moderate broad-based disc bulge with faint enhancing fissure versus annular calcification. Moderate facet arthropathy and ligamentum flavum redundancy. Moderate canal stenosis. Moderate to severe RIGHT, mild LEFT neural foraminal narrowing. L4-5: Small to moderate broad-based disc bulge asymmetric to the RIGHT. Severe RIGHT, mild LEFT facet arthropathy and ligamentum flavum redundancy without canal stenosis. Severe RIGHT neural foraminal narrowing. L5-S1: Transitional anatomy, partially sacralized L5 vertebral body without disc bulge, canal stenosis. Mild RIGHT neural foraminal narrowing associated with severe facet  arthropathy. IMPRESSION: MRI THORACIC SPINE: No acute osseous process nor MR findings of metastatic disease. Multilevel disc protrusions without canal stenosis or neural foraminal  narrowing. MRI LUMBAR SPINE:  No MR findings of metastatic disease. Severe L2-3 disc edema and enhancing endplate changes, which can be seen with severe inflammatory changes or, osteomyelitis which is less favored. Recommend correlation with ESR/CRP. Grade 1 L1-2 retrolisthesis on degenerative basis. Moderate canal stenosis L2-3 and L3-4. Neural foraminal narrowing at all lumbar levels: Severe on the LEFT at L2-3, severe on the RIGHT at L4-5. Electronically Signed: By: Elon Alas M.D. On: 04/20/2015 00:10   Ct Abdomen Pelvis W Contrast  04/19/2015  CLINICAL DATA:  59 year old female with abdominal pain and vomiting. History of small-bowel obstruction. EXAM: CT ABDOMEN AND PELVIS WITH CONTRAST TECHNIQUE: Multidetector CT imaging of the abdomen and pelvis was performed using the standard protocol following bolus administration of intravenous contrast. CONTRAST:  22m OMNIPAQUE IOHEXOL 300 MG/ML  SOLN COMPARISON:  CT dated 07/11/2014 FINDINGS: The visualized lung bases are clear. No intra-abdominal free air or free fluid. Partially visualized right cardiophrenic fluid density may represent a pericardial cyst. Partially visualized right breast implant. A thin low attenuating fluid collection noted in the superior aspect of the liver under the diaphragm which may represent a subcapsular collection or implants. There is a focal area low-attenuation in the left lobe of the liver along the falciform ligament measuring approximately 3.6 x 2.4 cm. This area appears increased in size compared to the prior study. Multiple small hypodense lesion noted at the dome of the liver measuring up to 1 cm. These lesions appear new compared to the prior study and are concerning for metastatic disease. Switch Create a 3 cholecystectomy. Dilated at the common bile duct, likely post cholecystectomy. The pancreas there is, spleen, and adrenal glands appear unremarkable. There is a 5 mm nonobstructing left renal inferior pole  calculus. There is mild bilateral renal cortical atrophy. There is an extrarenal pelvis on the right. No hydronephrosis. The visualized ureters and urinary bladder appear unremarkable. The uterus is grossly unremarkable. 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 No evidence of bowel obstruction. Moderate stool there is noted throughout the colon. No bowel inflammation. Normal appendix. The abdominal aorta and IVC appear patent. No portal venous gas identified. There is no adenopathy. Multiple small peritoneal defects noted in the upper anterior abdominal wall in the midline. There is focal area of protrusion and abutment of a loop of small bowel compatible with adhesions. No evidence of bowel obstruction. There is degenerative changes of the spine. There are 6 non rib bearing lumbar vertebrae. There is extensive degenerative changes at L2-L3 and L3-L4. Multiple lytic lesions noted at L3 and L4 concerning for metastatic disease. IMPRESSION: Constipation.  No evidence of bowel obstruction or inflammation. Interval increase in the size of the hypo attenuating lesion along the falciform ligament in the left lobe of the liver. Multiple hypodense lesions in the dome of the liver, new from prior study. Findings compatible with progression of hepatic metastatic disease. Subcapsular fluid collection/ implant over the hepatic dome. Multiple lytic lesion predominantly involving L3 and L4 vertebra concerning for metastatic disease. Electronically Signed   By: AAnner CreteM.D.   On: 04/19/2015 03:02   [place]  04/08/2015  CLINICAL DATA:  59year old female with a history of breast carcinoma. Suspicion of metastasis to liver. EXAM: ULTRASOUND GUIDED CORE BIOPSY OF RIGHT LIVER MASS MEDICATIONS: 2.0 mg IV Versed; 75 mcg IV Fentanyl Total Moderate Sedation  Time: 20 PROCEDURE: The procedure, risks, benefits, and alternatives were explained to the patient. Questions regarding the procedure were encouraged and  answered. The patient understands and consents to the procedure. The right upper quad was prepped with Betadine in a sterile fashion, and a sterile drape was applied covering the operative field. A sterile gown and sterile gloves were used for the procedure. Local anesthesia was provided with 1% Lidocaine. Once the patient is prepped and draped in the usual sterile fashion, the skin and subcutaneous tissues were generously infiltrated 1% lidocaine for local anesthesia. Small incision was made with 11 blade scalpel, and using ultrasound guidance, a 17 gauge guide needle was advanced into the liver lesion of the right liver lobe. Three separate 18 gauge core biopsy were retrieved. Two Gel-Foam pledgets were infused. Final image demonstrates no complicating features. Patient tolerated the procedure well and remained hemodynamically stable throughout. No complications encountered and no significant blood loss encounter. COMPLICATIONS: None. FINDINGS: Ultrasound survey demonstrates heterogeneously hypoechoic lesion at right liver lobe. Images during the case demonstrate needle tip within the lesion on each needle pass. Final images demonstrate no complicating features, with expected gas secondary to Gel-Foam pledget infusion. IMPRESSION: Status post ultrasound-guided core biopsy of right liver lobe mass. Tissue specimen sent to pathology for complete histopathologic analysis. Signed, Dulcy Fanny. Earleen Newport, DO [facility] Radiology Electronically Signed   By: Corrie Mckusick D.O.   On: 04/08/2015 14:06   Ir Fluoro Guide Cv Line Right  04/21/2015  CLINICAL DATA:  Progressive metastatic breast carcinoma. Needs venous access for chemotherapy. EXAM: TUNNELED PORT CATHETER PLACEMENT WITH ULTRASOUND AND FLUOROSCOPIC GUIDANCE FLUOROSCOPY TIME:  0.8 minutes, 62 uGym2 DAP ANESTHESIA/SEDATION: Intravenous Fentanyl and Versed were administered as conscious sedation during continuous  cardiorespiratory monitoring by the radiology RN, with a total moderate sedation time of 20 minutes. TECHNIQUE: The procedure, risks, benefits, and alternatives were explained to the patient. Questions regarding the procedure were encouraged and answered. The patient understands and consents to the procedure. Antibiotic prophylaxis was ordered pre-procedure and administered intravenously within one hour of incision. Patency of the right IJ vein was confirmed with ultrasound with image documentation. An appropriate skin site was determined. Skin site was marked. Region was prepped using maximum barrier technique including cap and mask, sterile gown, sterile gloves, large sterile sheet, and Chlorhexidine as cutaneous antisepsis. The region was infiltrated locally with 1% lidocaine. Under real-time ultrasound guidance, the right IJ vein was accessed with a 21 gauge micropuncture needle; the needle tip within the vein was confirmed with ultrasound image documentation. Needle was exchanged over a 018 guidewire for transitional dilator which allowed passage of the [facility] wire into the IVC. Over this, the transitional dilator was exchanged for a 5 [place] MPA catheter. A small incision was made on the right anterior chest wall and a subcutaneous pocket fashioned. The power-injectable port was positioned and its catheter tunneled to the right IJ dermatotomy site. The MPA catheter was exchanged over an Amplatz wire for a peel-away sheath, through which the port catheter, which had been trimmed to the appropriate length, was advanced and positioned under fluoroscopy with its tip at the cavoatrial junction. Spot chest radiograph confirms good catheter position and no pneumothorax. The pocket was closed with deep interrupted and subcuticular continuous 3-0 Monocryl sutures. The port was flushed per protocol. The incisions were covered with Dermabond then covered with a sterile dressing. COMPLICATIONS: COMPLICATIONS None  immediate IMPRESSION: Technically successful right IJ power-injectable port catheter placement. Ready  for routine use. Electronically Signed   By: Lucrezia Europe M.D.   On: 04/21/2015 15:36   Ir US Guide Vasc Access Right  04/21/2015  CLINICAL DATA:  Progressive metastatic breast carcinoma. Needs venous access for chemotherapy. EXAM: TUNNELED PORT CATHETER PLACEMENT WITH ULTRASOUND AND FLUOROSCOPIC GUIDANCE FLUOROSCOPY TIME:  0.8 minutes, 62 uGym2 DAP ANESTHESIA/SEDATION: Intravenous Fentanyl and Versed were administered as conscious sedation during continuous cardiorespiratory monitoring by the radiology RN, with a total moderate sedation time of 20 minutes. TECHNIQUE: The procedure, risks, benefits, and alternatives were explained to the patient. Questions regarding the procedure were encouraged and answered. The patient understands and consents to the procedure. Antibiotic prophylaxis was ordered pre-procedure and administered intravenously within one hour of incision. Patency of the right IJ vein was confirmed with ultrasound with image documentation. An appropriate skin site was determined. Skin site was marked. Region was prepped using maximum barrier technique including cap and mask, sterile gown, sterile gloves, large sterile sheet, and Chlorhexidine as cutaneous antisepsis. The region was infiltrated locally with 1% lidocaine. Under real-time ultrasound guidance, the right IJ vein was accessed with a 21 gauge micropuncture needle; the needle tip within the vein was confirmed with ultrasound image documentation. Needle was exchanged over a 018 guidewire for transitional dilator which allowed passage of the [facility] wire into the IVC. Over this, the transitional dilator was exchanged for a 5 [place] MPA catheter. A small incision was made on the right anterior chest wall and a subcutaneous pocket fashioned. The power-injectable port was positioned and its catheter tunneled to the right IJ dermatotomy site. The  MPA catheter was exchanged over an Amplatz wire for a peel-away sheath, through which the port catheter, which had been trimmed to the appropriate length, was advanced and positioned under fluoroscopy with its tip at the cavoatrial junction. Spot chest radiograph confirms good catheter position and no pneumothorax. The pocket was closed with deep interrupted and subcuticular continuous 3-0 Monocryl sutures. The port was flushed per protocol. The incisions were covered with Dermabond then covered with a sterile dressing. COMPLICATIONS: COMPLICATIONS None immediate IMPRESSION: Technically successful right IJ power-injectable port catheter placement. Ready for routine use. Electronically Signed   By: Lucrezia Europe M.D.   On: 04/21/2015 15:36     PERTINENT LAB RESULTS: CBC:  Recent Labs  04/20/15 0620 04/21/15 0530  WBC 4.1 3.5*  HGB 10.1* 9.0*  HCT 31.7* 28.0*  PLT 90* 76*   CMET CMP     Component Value Date/Time   NA 143 04/21/2015 0530   NA 141 03/18/2015 0918   NA 142 12/31/2014 1007   K 3.3* 04/21/2015 0530   K 3.8 03/18/2015 0918   K 4.0 12/31/2014 1007   CL 111 04/21/2015 0530   CL 102 12/31/2014 1007   CO2 24 04/21/2015 0530   CO2 26 03/18/2015 0918   CO2 27 12/31/2014 1007   GLUCOSE 78 04/21/2015 0530   GLUCOSE 91 03/18/2015 0918   GLUCOSE 89 12/31/2014 1007   BUN 10 04/21/2015 0530   BUN 12.5 03/18/2015 0918   BUN 26* 12/31/2014 1007   CREATININE 1.15* 04/21/2015 0530   CREATININE 1.2* 03/18/2015 0918   CREATININE 1.1 12/31/2014 1007   CALCIUM 7.9* 04/21/2015 0530   CALCIUM 9.2 03/18/2015 0918   CALCIUM 8.5 12/31/2014 1007   PROT 5.3* 04/21/2015 0530   PROT 7.4 03/18/2015 0918   PROT 7.1 12/31/2014 1007   ALBUMIN 2.6* 04/21/2015 0530   ALBUMIN 3.6 03/18/2015 0918   ALBUMIN 3.4 12/31/2014  1007   AST 17 04/21/2015 0530   AST 21 03/18/2015 0918   AST 32 12/31/2014 1007   ALT 12* 04/21/2015 0530   ALT 11 03/18/2015 0918   ALT 36 12/31/2014 1007   ALKPHOS 60  04/21/2015 0530   ALKPHOS 74 03/18/2015 0918   ALKPHOS 92* 12/31/2014 1007   BILITOT 0.8 04/21/2015 0530   BILITOT 0.57 03/18/2015 0918   BILITOT 0.60 12/31/2014 1007   GFRNONAA 51* 04/21/2015 0530   GFRNONAA 64* 03/07/2011 0000   GFRAA 59* 04/21/2015 0530   GFRAA 74* 03/07/2011 0000    GFR Estimated Creatinine Clearance: 62.8 mL/min (by C-G formula based on Cr of 1.15). No results for input(s): LIPASE, AMYLASE in the last 72 hours.  Recent Labs  04/19/15 0635 04/19/15 1839 04/20/15 0620  TROPONINI 0.04* 0.25* 0.27*   Invalid input(s): POCBNP No results for input(s): DDIMER in the last 72 hours.  Recent Labs  04/19/15 1839  HGBA1C 5.2   No results for input(s): CHOL, HDL, LDLCALC, TRIG, CHOLHDL, LDLDIRECT in the last 72 hours.  Recent Labs  04/19/15 1839  TSH 0.020*   No results for input(s): VITAMINB12, FOLATE, FERRITIN, TIBC, IRON, RETICCTPCT in the last 72 hours. Coags:  Recent Labs  04/20/15 0620  INR 1.17   Microbiology: Recent Results (from the past 240 hour(s))  MRSA PCR Screening     Status: Abnormal   Collection Time: 04/19/15 12:22 PM  Result Value Ref Range Status   MRSA by PCR POSITIVE (A) NEGATIVE Final    Comment:        The GeneXpert MRSA Assay (FDA approved for NASAL specimens only), is one component of a comprehensive MRSA colonization surveillance program. It is not intended to diagnose MRSA infection nor to guide or monitor treatment for MRSA infections. RESULT CALLED TO, READ BACK BY AND VERIFIED WITH: Jefferson Fuel RN 15:25 04/19/15 ([email or web address])   Culture, blood (routine x 2)     Status: None (Preliminary result)   Collection Time: 04/20/15 11:40 AM  Result Value Ref Range Status   Specimen Description BLOOD LEFT ANTECUBITAL  Final   Special Requests IN PEDIATRIC BOTTLE 3CCS  Final   Culture NO GROWTH < 24 HOURS  Final   Report Status PENDING  Incomplete  Culture, blood (routine x 2)     Status: None (Preliminary result)    Collection Time: 04/20/15  4:15 PM  Result Value Ref Range Status   Specimen Description BLOOD LEFT HAND  Final   Special Requests IN PEDIATRIC BOTTLE 3CC  Final   Culture NO GROWTH < 24 HOURS  Final   Report Status PENDING  Incomplete     BRIEF HOSPITAL COURSE:  Intractable nausea and vomiting: Treated with conservative measures-PPI/Anti-emetics-initially kept NPO-diet advanced-by day of discharge-tolerating regular diet and anxious to go home. Continue PPI,continue to treat constipation. Etiology Could be from underlying cancer with hepatic metastases-with constipation contributing.  Active Problems: Chest pain: Probably GI related, start PPI. 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 Suspect the elevated troponin could be from mild renal failure or demand ischemia. Echocardiogram did not show major major abnormalities-no further work up needed per cardiology.  Acute on chronic kidney disease stage II: Likely prerenal azotemia. Improving with IV fluids-please check lytes at next visit with PCP  Hypertension: Resume Coreg, follow BP trend closely  L2-L3 disc edema: seen incidentally on MRI LS Spine done to look for metastatic disease. ESR mildly elevated at 40, CRP negative, blood cultures 12/20 negative so far-not sure what exactly  the etiology is, spoke with Dr Parks Neptune will follow patient closely. If develops fever or worsening back pain-may need biopsy of that area.Does not have fever/Leukocytosis. Spoke with radiology Dr Saralyn Pilar over the phone-who reviwed prior CT Scan Abd-area of concern present then as well-he suspects arthritic/degenerative changes and highly doubts infection.  Metastatic breast cancer: Oncology following-Porta Cath placed this admission-  Chronic back pain:  resume patient's narcotics-see above regarding MRI LS Spine changes.  Hypothyroidism: TSH suppressed-decrease levothyroxine to 150 g (per patient her last adjustment was in March)  Constipation: Had BM this  morning-recent bowel regimen, likely secondary to narcotics. Continue sennokot and Miralax on discharge.   TODAY-DAY OF DISCHARGE:  Subjective:   Audrea Muscat Mahjouba today has no headache,no chest abdominal pain,no new weakness tingling or numbness, feels much better wants to go home today.   Objective:   Blood pressure 130/65, pulse 63, temperature 98.4 F (36.9 C), temperature source Oral, resp. rate 16, height _0  (1.676 m), weight 102.4 kg (225 lb 12 oz), last menstrual period 08/30/2006, SpO2 100 %.  Intake/Output Summary (Last 24 hours) at 04/21/15 1615 Last data filed at 04/21/15 1300  Gross per 24 hour  Intake    260 ml  Output      0 ml  Net    260 ml   Filed Weights   04/19/15 0927 04/20/15 0635 04/21/15 0548  Weight: 102.513 kg (226 lb) 102.3 kg (225 lb 8.5 oz) 102.4 kg (225 lb 12 oz)    Exam Awake Alert, Oriented *3, No new F.N deficits, Normal affect [place].AT,PERRAL Supple Neck,No JVD, No cervical lymphadenopathy appriciated.  Symmetrical Chest wall movement, Good air movement bilaterally, CTAB RRR,No Gallops,Rubs or new Murmurs, No Parasternal Heave +ve B.Sounds, Abd Soft, Non tender, No organomegaly appriciated, No rebound -guarding or rigidity. No Cyanosis, Clubbing or edema, No new Rash or bruise  DISCHARGE CONDITION: Stable  DISPOSITION: Home  DISCHARGE INSTRUCTIONS:    Activity:  As tolerated   Please get a complete blood count and chemistry panel checked at your next visit  Repeat TSH in 3 months-as dose of levothyroxine was adjusted  You have some swelling on L2-L3 disk edema seen on MRI Spine, this will need to be followed closely. If you have worsening back pain, please let your MD know-you may need a biopsy of that area.  Please ask your Primary Md to check final Blood culture results-preliminary negative at the time of discharge.  Stay on bowel regimen with Miralaz/Senokot to treat consitpation, if your stools get loose/frequent-can  temporarily stop taking these medications.   Get Medicines reviewed and adjusted: Please take all your medications with you for your next visit with your Primary MD  Please request your Primary MD to go over all hospital tests and procedure/radiological results at the follow up, please ask your Primary MD to get all Hospital records sent to his/her office.  If you experience worsening of your admission symptoms, develop shortness of breath, life threatening emergency, suicidal or homicidal thoughts you must seek medical attention immediately by calling 911 or calling your MD immediately  if symptoms less severe.  You must read complete instructions/literature along with all the possible adverse reactions/side effects for all the Medicines you take and that have been prescribed to you. Take any new Medicines after you have completely understood and accpet all the possible adverse reactions/side effects.   Do not drive when taking Pain medications.   Do not take more than prescribed Pain, Sleep and  Anxiety Medications  Special Instructions: If you have smoked or chewed Tobacco  in the last 2 yrs please stop smoking, stop any regular Alcohol  and or any Recreational drug use.  Wear Seat belts while driving.  Please note  You were cared for by a hospitalist during your hospital stay. Once you are discharged, your primary care physician will handle any further medical issues. Please note that NO REFILLS for any discharge medications will be authorized once you are discharged, as it is imperative that you return to your primary care physician (or establish a relationship with a primary care physician if you do not have one) for your aftercare needs so that they can reassess your need for medications and monitor your lab values.   Diet recommendation: Heart Healthy diet  Discharge Instructions    Diet - low sodium heart healthy    Complete by:  As directed      Increase activity slowly     Complete by:  As directed            Follow-up Information    Follow up with [name], MD. Schedule an appointment as soon as possible for a visit in 1 week.   Specialty:  Family Medicine   Contact information:   [address] [address] [phone]       Follow up with Volanda Napoleon, MD. Schedule an appointment as soon as possible for a visit in 1 week.   Specialty:  Oncology   Why:  Hospital follow up   Contact information:   [address], [address] [place] [address] [phone]       Total Time spent on discharge equals 45 minutes.  SignedOren Binet 04/21/2015 4:15 PM

## 2015-04-21 NOTE — Progress Notes (Signed)
Nsg Discharge Note  Admit Date:  04/19/2015 Discharge date: 04/21/2015   Kerri Vasquez to be D/C'd Home per MD order.  AVS completed.  Copy for chart, and copy for patient signed, and dated. Patient/caregiver able to verbalize understanding.  Discharge Medication:   Medication List    STOP taking these medications        oxyCODONE-acetaminophen 10-325 MG tablet  Commonly known as:  PERCOCET      TAKE these medications        carvedilol 6.25 MG tablet  Commonly known as:  COREG  Take 6.25 mg by mouth 2 (two) times daily with a meal.     cyanocobalamin 1000 MCG/ML injection  Commonly known as:  (VITAMIN B-12)  INJECT 1 ML AS DIRECTED EVERY 21 DAYS     everolimus 5 MG tablet  Commonly known as:  AFINITOR  Take 1 tablet (5 mg total) by mouth daily.     FLUoxetine 40 MG capsule  Commonly known as:  PROZAC  Take 40 mg by mouth daily before breakfast.     fulvestrant 250 MG/5ML injection  Commonly known as:  FASLODEX  Inject 250 mg into the muscle every 30 (thirty) days.     furosemide 20 MG tablet  Commonly known as:  LASIX  Take 60 mg by mouth daily.     levothyroxine 150 MCG tablet  Commonly known as:  SYNTHROID, LEVOTHROID  Take 1 tablet (150 mcg total) by mouth daily.     LORazepam 0.5 MG tablet  Commonly known as:  ATIVAN  PLACE 1 TABLET BY MOUTH UNDER THE TONGUE EVERY 6 HOURS AS NEEDED FOR NAUSEA     methocarbamol 750 MG tablet  Commonly known as:  ROBAXIN  Take 750 mg by mouth 4 (four) times daily.     MULTI-VITAMINS Tabs  Take 1 tablet by mouth daily.     nitroGLYCERIN 0.4 MG SL tablet  Commonly known as:  NITROSTAT  Place 1 tablet (0.4 mg total) under the tongue every 5 (five) minutes as needed for chest pain.     ondansetron 8 MG tablet  Commonly known as:  ZOFRAN  Take 1 tablet (8 mg total) by mouth every 8 (eight) hours as needed for nausea or vomiting.     oxycodone 5 MG capsule  Commonly known as:  OXY-IR  Take 2-4 capsules (10-20 mg  total) by mouth every 4 (four) hours.     oxymorphone 15 MG 12 hr tablet  Commonly known as:  OPANA ER  Take 15 mg by mouth every 12 (twelve) hours.     pantoprazole 40 MG tablet  Commonly known as:  PROTONIX  Take 1 tablet (40 mg total) by mouth daily at 12 noon.     polyethylene glycol packet  Commonly known as:  MIRALAX / GLYCOLAX  Take 17 g by mouth 2 (two) times daily.     potassium chloride SA 20 MEQ tablet  Commonly known as:  KLOR-CON M20  Take 2 tablets (40 mEq total) by mouth 2 (two) times daily.     pregabalin 200 MG capsule  Commonly known as:  LYRICA  Take 200 mg by mouth 3 (three) times daily.     promethazine 25 MG tablet  Commonly known as:  PHENERGAN  TAKE 1 TABLET BY MOUTH THREE TIMES DAILY AS NEEDED FOR NAUSEA     senna 8.6 MG Tabs tablet  Commonly known as:  SENOKOT  Take 2 tablets (17.2 mg total) by mouth at  bedtime.     spironolactone 25 MG tablet  Commonly known as:  ALDACTONE  Take 1 tablet (25 mg total) by mouth daily.     temazepam 15 MG capsule  Commonly known as:  RESTORIL  Take 30 mg by mouth at bedtime.     tiZANidine 4 MG tablet  Commonly known as:  ZANAFLEX  Take 4 mg by mouth every 6 (six) hours as needed for muscle spasms.        Discharge Assessment: Filed Vitals:   04/21/15 1507 04/21/15 1631  BP: 130/65 105/43  Pulse: 63 72  Temp:  97.5 F (36.4 C)  Resp: 16 21   Skin clean, dry and intact without evidence of skin break down, no evidence of skin tears noted. IV catheter discontinued intact. Site without signs and symptoms of complications - no redness or edema noted at insertion site, patient denies c/o pain - only slight tenderness at site.  Dressing with slight pressure applied.  D/c Instructions-Education: Discharge instructions given to patient/family with verbalized understanding. D/c education completed with patient/family including follow up instructions, medication list, d/c activities limitations if indicated,  with other d/c instructions as indicated by MD - patient able to verbalize understanding, all questions fully answered. Patient instructed to return to ED, call 911, or call MD for any changes in condition.  Patient escorted via [place], and D/C home via private auto.  Dayle Points, RN 04/21/2015 5:04 PM

## 2015-04-21 NOTE — Discharge Instructions (Signed)
Follow with Primary MD  [name], MD  and Dr Marin Olp  Please get a complete blood count and chemistry panel checked at your next visit  Repeat TSH in 3 months-as dose of levothyroxine was adjusted  You have some swelling on L2-L3 disk edema seen on MRI Spine, this will need to be followed closely. If you have worsening back pain, please let your MD know-you may need a biopsy of that area.  Please ask your Primary Md to check final Blood culture results-preliminary negative at the time of discharge.  Stay on bowel regimen with Miralaz/Senokot to treat consitpation, if your stools get loose/frequent-can temporarily stop taking these medications.   Get Medicines reviewed and adjusted. Please take all your medications with you for your next visit with your Primary MD  Please request your Primary MD to go over all hospital tests and procedure/radiological results at the follow up, please ask your Primary MD to get all Hospital records sent to his/her office.  If you experience worsening of your admission symptoms, develop shortness of breath, life threatening emergency, suicidal or homicidal thoughts you must seek medical attention immediately by calling 911 or calling your MD immediately  if symptoms less severe.  You must read complete instructions/literature along with all the possible adverse reactions/side effects for all the Medicines you take and that have been prescribed to you. Take any new Medicines after you have completely understood and accpet all the possible adverse reactions/side effects.   Do not drive when taking Pain medications or sleeping medications (Benzodaizepines)  Do not take more than prescribed Pain, Sleep and Anxiety Medications  Special Instructions: If you have smoked or chewed Tobacco  in the last 2 yrs please stop smoking, stop any regular Alcohol  and or any Recreational drug use.  Wear Seat belts while driving.  Please note  You were cared for  by a hospitalist during your hospital stay. Once you are discharged, your primary care physician will handle any further medical issues. Please note that NO REFILLS for any discharge medications will be authorized once you are discharged, as it is imperative that you return to your primary care physician (or establish a relationship with a primary care physician if you do not have one) for your aftercare needs so that they can reassess your need for medications and monitor your lab values.

## 2015-04-21 NOTE — Sedation Documentation (Signed)
Denies pain

## 2015-04-21 NOTE — Progress Notes (Signed)
Echocardiogram 2D Echocardiogram has been performed.  Kerri Vasquez 04/21/2015, 10:16 AM

## 2015-04-21 NOTE — Progress Notes (Signed)
Chief Complaint: Patient was seen in consultation today for port placement at the request of Dr. Marin Olp  History of Present Illness: Kerri Vasquez is a 59 y.o. female with metastatic breast cancer and progressive disease. She is in need of chemotherapy again and IR is requested to place port. Chart reviewed and pt underwent Cardiology eval for clearance including an Echo today. Appreciate cardiology eval and input prior to procedure. Pt well known to IR/Dr. Vernard Gambles with hx of previous biopsies and ablation therapy of liver lesions. Chart, PMHx, meds, labs, reviewed. She previously had left sided port placed and subsequently removed after treatment several years ago. Has been NPO since early this morning Offers no new c/o  Past Medical History  Diagnosis Date  . Depression   . Hypertension   . Hypothyroidism   . Incisional hernia   . Spinal stenosis   . Scoliosis   . Sciatica   . Intestinal obstruction ([place])   . GERD (gastroesophageal reflux disease)   . Septic arthritis ([place]) 01/28/10    and spinal stenosis , moderate scoliosis   . Osteoarthritis of multiple joints   . Sleep apnea     hx of off machine x 4 years   . Renal insufficiency     hx renal failure 2011  . Fibromyalgia   . Fracture of multiple toes     right toes x 4 toes- excluding small toe  . Diabetes mellitus     type II, controlled by diet 05/25/11   . CHF (congestive heart failure) ([place])   . Anemia   . Pernicious anemia 05/30/2011    Iron transfusion and VB12 on 06/06/11  . Migraine     "weekly; but they don't last long" (08/26/2014)  . Chronic lower back pain   . Breast cancer, right breast ([place])   . Liver cancer ([place])     "twice"  . Thyroid cancer ([place])   . Myocardial infarction ([place]) 06/2014    S/P percutaneous intervention procedure a metastatic lesion in the liver  . Nausea and vomiting 04/2015    Past Surgical History  Procedure Laterality Date  . Tonsillectomy  1969  . Multiple  extractions with alveoloplasty  06/08/2011    Procedure: MULTIPLE EXTRACION WITH ALVEOLOPLASTY;  Surgeon: Lenn Cal, DDS;  Location: [facility];  Service: Oral Surgery;  Laterality: N/A;  Extraction of tooth #'s 3,4,7 with alveoloplasty and Gross Debridement of Remaining Teeth  . Gastric bypass  2006    "w/scope"  . Colectomy  2011    "SBO"  . Radiofrequency ablation liver tumor  2011; 01/26/2012  . Thyroid lobectomy  02/16/2012    Procedure: THYROID LOBECTOMY;  Surgeon: Earnstine Regal, MD;  Location: [facility];  Service: General;  Laterality: Right;  . Left heart catheterization with coronary angiogram N/A 07/14/2014    Procedure: LEFT HEART CATHETERIZATION WITH CORONARY ANGIOGRAM;  Surgeon: Leonie Man, MD;  Location: [facility] CATH LAB;  Service: Cardiovascular;  Laterality: N/A;  . Cholecystectomy open  06/29/85  . Breast implant removal Left 2015  . Mastectomy Bilateral 2008  . Breast biopsy Right 2008  . Breast reconstruction Bilateral 2008; 2009    "w/the mastectomy; "  . Breast reconstruction Left ~ 2010    "it capsized"  . Cardiac catheterization    . Liver biopsy  04/2015    Allergies: Influenza vac split; Ritalin; Zostavax; and Adhesive  Medications: Prior to Admission medications   Medication Sig Start Date End Date Taking? Authorizing  Provider  carvedilol (COREG) 6.25 MG tablet Take 6.25 mg by mouth 2 (two) times daily with a meal.   Yes Historical Provider, MD  cyanocobalamin (,VITAMIN B-12,) 1000 MCG/ML injection INJECT 1 ML AS DIRECTED EVERY 21 DAYS 03/18/15  Yes [place], NP  everolimus (AFINITOR) 5 MG tablet Take 1 tablet (5 mg total) by mouth daily. 02/12/15  Yes Volanda Napoleon, MD  FLUoxetine (PROZAC) 40 MG capsule Take 40 mg by mouth daily before breakfast.    Yes Historical Provider, MD  fulvestrant (FASLODEX) 250 MG/5ML injection Inject 250 mg into the muscle every 30 (thirty) days. 02/26/12  Yes Historical Provider, MD  furosemide (LASIX) 20 MG tablet Take  60 mg by mouth daily.    Yes Historical Provider, MD  levothyroxine (SYNTHROID, LEVOTHROID) 200 MCG tablet Take 200 mcg by mouth daily. 12/15/14  Yes Historical Provider, MD  LORazepam (ATIVAN) 0.5 MG tablet PLACE 1 TABLET BY MOUTH UNDER THE TONGUE EVERY 6 HOURS AS NEEDED FOR NAUSEA Patient taking differently: Take 1 mg by mouth every 6 (six) hours as needed for anxiety. PLACE 1 TABLET BY MOUTH UNDER THE TONGUE EVERY 6 HOURS AS NEEDED FOR NAUSEA 03/18/15  Yes [place], NP  methocarbamol (ROBAXIN) 750 MG tablet Take 750 mg by mouth 4 (four) times daily.   Yes Historical Provider, MD  Multiple Vitamin (MULTI-VITAMINS) TABS Take 1 tablet by mouth daily. 11/29/11  Yes Historical Provider, MD  ondansetron (ZOFRAN) 8 MG tablet Take 1 tablet (8 mg total) by mouth every 8 (eight) hours as needed for nausea or vomiting. 07/20/14  Yes Volanda Napoleon, MD  oxycodone (OXY-IR) 5 MG capsule Take 15 mg by mouth every 4 (four) hours.   Yes Historical Provider, MD  oxyCODONE-acetaminophen (PERCOCET) 10-325 MG per tablet Take 1 tablet by mouth every 5 (five) hours as needed (4 x per day). 7-25- "INCREASED BY PAIN DOCTOR"   Yes Historical Provider, MD  oxymorphone (OPANA ER) 15 MG 12 hr tablet Take 15 mg by mouth every 12 (twelve) hours.   Yes Historical Provider, MD  potassium chloride SA (KLOR-CON M20) 20 MEQ tablet Take 2 tablets (40 mEq total) by mouth 2 (two) times daily. Patient taking differently: Take 40 mEq by mouth daily.  09/04/14  Yes Larey Dresser, MD  pregabalin (LYRICA) 200 MG capsule Take 200 mg by mouth 3 (three) times daily.   Yes Historical Provider, MD  promethazine (PHENERGAN) 25 MG tablet TAKE 1 TABLET BY MOUTH THREE TIMES DAILY AS NEEDED FOR NAUSEA 04/14/15  Yes Eliezer Bottom, NP  spironolactone (ALDACTONE) 25 MG tablet Take 1 tablet (25 mg total) by mouth daily. 03/15/15  Yes Shaune Pascal Bensimhon, MD  temazepam (RESTORIL) 15 MG capsule Take 30 mg by mouth at bedtime.   Yes Historical  Provider, MD  tiZANidine (ZANAFLEX) 4 MG tablet Take 4 mg by mouth every 6 (six) hours as needed for muscle spasms.   Yes Historical Provider, MD  nitroGLYCERIN (NITROSTAT) 0.4 MG SL tablet Place 1 tablet (0.4 mg total) under the tongue every 5 (five) minutes as needed for chest pain. 07/18/14   Annita Brod, MD     Family History  Problem Relation Age of Onset  . Cancer Mother     breast  . Hypertension Mother   . Anesthesia problems Daughter     Social History   Social History  . Marital Status: Divorced    Spouse Name: N/A  . Number of Children: 1  .  Years of Education: N/A   Social History Main Topics  . Smoking status: Never Smoker   . Smokeless tobacco: Never Used     Comment: NEVER USED TOBACCO  . Alcohol Use: No  . Drug Use: No  . Sexual Activity: Not Currently   Other Topics Concern  . None   Social History Narrative   Caffeine use: 3 cups coffee/tea daily   Regular exercise: no          Review of Systems: A 12 point ROS discussed and pertinent positives are indicated in the HPI above.  All other systems are negative.  Review of Systems  Vital Signs: BP 130/66 mmHg  Pulse 60  Temp(Src) 98.4 F (36.9 C) (Oral)  Resp 17  Ht 5\' 6"  (1.676 m)  Wt 225 lb 12 oz (102.4 kg)  BMI 36.45 kg/m2  SpO2 95%  LMP 08/30/2006  Physical Exam  Constitutional: She is oriented to person, place, and time. She appears well-developed. No distress.  HENT:  Head: Normocephalic.  Mouth/Throat: Oropharynx is clear and moist.  Neck: Normal range of motion. No JVD present. No tracheal deviation present.  Cardiovascular: Normal rate, regular rhythm and normal heart sounds.   Pulmonary/Chest: Effort normal and breath sounds normal. No respiratory distress.  Neurological: She is alert and oriented to person, place, and time.  Psychiatric: Judgment normal.    Mallampati Score:  MD Evaluation Airway: WNL Heart: WNL Abdomen: WNL Chest/ Lungs: WNL ASA  Classification:  3 Mallampati/Airway Score: Two    Labs:  CBC:  Recent Labs  04/08/15 1200 04/19/15 0208 04/20/15 0620 04/21/15 0530  WBC 3.7* 7.8 4.1 3.5*  HGB 9.8* 11.6* 10.1* 9.0*  HCT 29.9* 36.3 31.7* 28.0*  PLT 67* 97* 90* 76*    COAGS:  Recent Labs  07/07/14 1005 07/11/14 1842 07/12/14 0705 08/27/14 1210 04/08/15 1200 04/20/15 0620  INR 1.11 1.21 1.20 1.20 1.17 1.17  APTT 36 29  --  35 34  --     BMP:  Recent Labs  09/14/14 1230  12/31/14 1007  03/18/15 0918 04/19/15 0034 04/20/15 0620 04/21/15 0530  NA 142  < > 142  < > 141 142 143 143  K 4.7  < > 4.0  < > 3.8 4.5 3.6 3.3*  CL 113*  < > 102  --   --  111 111 111  CO2 18*  < > 27  < > 26 20* 21* 24  GLUCOSE 77  < > 89  < > 91 136* 83 78  BUN 38*  < > 26*  < > 12.5 14 11 10   CALCIUM 8.0*  < > 8.5  < > 9.2 9.2 8.5* 7.9*  CREATININE 1.84*  < > 1.1  < > 1.2* 1.29* 1.10* 1.15*  GFRNONAA 29*  --   --   --   --  44* 53* 51*  GFRAA 33*  --   --   --   --  51* >60 59*  < > = values in this interval not displayed.  LIVER FUNCTION TESTS:  Recent Labs  02/12/15 1345 03/18/15 0918 04/19/15 0208 04/19/15 0635 04/21/15 0530  BILITOT 0.30 0.57 NOT DONE 0.8 0.8  AST 19 21 26   --  17  ALT 12 11 16   --  12*  ALKPHOS 76 74 93  --  60  PROT 7.2 7.4 6.7  --  5.3*  ALBUMIN 3.3* 3.6 3.7  --  2.6*    Assessment and Plan: Metastatic  breast cancer For port placement Risks and Benefits discussed with the patient including, but not limited to bleeding, infection, pneumothorax, or fibrin sheath development and need for additional procedures. All of the patient's questions were answered, patient is agreeable to proceed. Consent signed and in chart.    SignedAscencion Dike 04/21/2015, 2:17 PM   I spent a total of 15 minutes in face to face in clinical consultation, greater than 50% of which was counseling/coordinating care for port placement

## 2015-04-21 NOTE — Procedures (Signed)
R IJ Port cathter placement with US and fluoroscopy No complication No blood loss. See complete dictation in [facility] PACS.  

## 2015-04-23 ENCOUNTER — Ambulatory Visit (HOSPITAL_BASED_OUTPATIENT_CLINIC_OR_DEPARTMENT_OTHER): Payer: Medicare Other

## 2015-04-23 ENCOUNTER — Ambulatory Visit (HOSPITAL_BASED_OUTPATIENT_CLINIC_OR_DEPARTMENT_OTHER): Payer: Medicare Other | Admitting: Family

## 2015-04-23 ENCOUNTER — Other Ambulatory Visit (HOSPITAL_BASED_OUTPATIENT_CLINIC_OR_DEPARTMENT_OTHER): Payer: Medicare Other

## 2015-04-23 VITALS — BP 106/60 | HR 79 | Temp 98.0°F | Resp 14 | Ht 66.0 in | Wt 232.0 lb

## 2015-04-23 DIAGNOSIS — Z95828 Presence of other vascular implants and grafts: Secondary | ICD-10-CM

## 2015-04-23 DIAGNOSIS — C50919 Malignant neoplasm of unspecified site of unspecified female breast: Secondary | ICD-10-CM

## 2015-04-23 DIAGNOSIS — C50911 Malignant neoplasm of unspecified site of right female breast: Secondary | ICD-10-CM

## 2015-04-23 DIAGNOSIS — I890 Lymphedema, not elsewhere classified: Secondary | ICD-10-CM | POA: Diagnosis not present

## 2015-04-23 DIAGNOSIS — D510 Vitamin B12 deficiency anemia due to intrinsic factor deficiency: Secondary | ICD-10-CM | POA: Diagnosis not present

## 2015-04-23 DIAGNOSIS — E538 Deficiency of other specified B group vitamins: Secondary | ICD-10-CM | POA: Diagnosis not present

## 2015-04-23 DIAGNOSIS — E038 Other specified hypothyroidism: Secondary | ICD-10-CM

## 2015-04-23 DIAGNOSIS — C50912 Malignant neoplasm of unspecified site of left female breast: Secondary | ICD-10-CM

## 2015-04-23 DIAGNOSIS — S025XXA Fracture of tooth (traumatic), initial encounter for closed fracture: Secondary | ICD-10-CM

## 2015-04-23 DIAGNOSIS — C787 Secondary malignant neoplasm of liver and intrahepatic bile duct: Secondary | ICD-10-CM | POA: Diagnosis not present

## 2015-04-23 DIAGNOSIS — D500 Iron deficiency anemia secondary to blood loss (chronic): Secondary | ICD-10-CM

## 2015-04-23 DIAGNOSIS — D649 Anemia, unspecified: Secondary | ICD-10-CM | POA: Diagnosis not present

## 2015-04-23 LAB — CBC WITH DIFFERENTIAL (CANCER CENTER ONLY)
BASO#: 0 10*3/uL (ref 0.0–0.2)
BASO%: 0.4 % (ref 0.0–2.0)
EOS%: 2.4 % (ref 0.0–7.0)
Eosinophils Absolute: 0.1 10*3/uL (ref 0.0–0.5)
HCT: 30.1 % — ABNORMAL LOW (ref 34.8–46.6)
HGB: 9.3 g/dL — ABNORMAL LOW (ref 11.6–15.9)
LYMPH#: 1 10*3/uL (ref 0.9–3.3)
LYMPH%: 19.5 % (ref 14.0–48.0)
MCH: 28.9 pg (ref 26.0–34.0)
MCHC: 30.9 g/dL — ABNORMAL LOW (ref 32.0–36.0)
MCV: 94 fL (ref 81–101)
MONO#: 0.5 10*3/uL (ref 0.1–0.9)
MONO%: 10.1 % (ref 0.0–13.0)
NEUT#: 3.4 10*3/uL (ref 1.5–6.5)
NEUT%: 67.6 % (ref 39.6–80.0)
Platelets: 107 10*3/uL — ABNORMAL LOW (ref 145–400)
RBC: 3.22 10*6/uL — ABNORMAL LOW (ref 3.70–5.32)
RDW: 13.1 % (ref 11.1–15.7)
WBC: 5 10*3/uL (ref 3.9–10.0)

## 2015-04-23 LAB — CMP (CANCER CENTER ONLY)
ALT(SGPT): 17 U/L (ref 10–47)
AST: 28 U/L (ref 11–38)
Albumin: 3.1 g/dL — ABNORMAL LOW (ref 3.3–5.5)
Alkaline Phosphatase: 59 U/L (ref 26–84)
BUN, Bld: 13 mg/dL (ref 7–22)
CO2: 25 meq/L (ref 18–33)
Calcium: 6.7 mg/dL — ABNORMAL LOW (ref 8.0–10.3)
Chloride: 107 meq/L (ref 98–108)
Creat: 1.3 mg/dL — ABNORMAL HIGH (ref 0.6–1.2)
Glucose, Bld: 76 mg/dL (ref 73–118)
Potassium: 4 meq/L (ref 3.3–4.7)
Sodium: 148 meq/L — ABNORMAL HIGH (ref 128–145)
Total Bilirubin: 0.5 mg/dL (ref 0.20–1.60)
Total Protein: 6.5 g/dL (ref 6.4–8.1)

## 2015-04-23 LAB — VITAMIN B12: Vitamin B-12: 581 pg/mL (ref 211–911)

## 2015-04-23 LAB — IRON AND TIBC CHCC
%SAT: 30 % (ref 11–50)
Iron: 56 ug/dL (ref 45–160)
TIBC: 185 ug/dL — AB (ref 250–450)
UIBC: 129 ug/dL (ref 125–400)

## 2015-04-23 LAB — FERRITIN CHCC: Ferritin: 931 ng/mL — ABNORMAL HIGH (ref 10–291)

## 2015-04-23 LAB — LACTATE DEHYDROGENASE (CC13): LDH: 213 U/L (ref 94–250)

## 2015-04-23 MED ORDER — HEPARIN SOD (PORK) LOCK FLUSH 100 UNIT/ML IV SOLN
500.0000 [IU] | Freq: Once | INTRAVENOUS | Status: AC
  Administered 2015-04-23: 500 [IU] via INTRAVENOUS
  Filled 2015-04-23: qty 5

## 2015-04-23 MED ORDER — SODIUM CHLORIDE 0.9 % IJ SOLN
10.0000 mL | INTRAMUSCULAR | Status: DC | PRN
  Administered 2015-04-23: 10 mL via INTRAVENOUS
  Filled 2015-04-23: qty 10

## 2015-04-23 MED ORDER — FENTANYL 75 MCG/HR TD PT72: 75.0000 ug | MEDICATED_PATCH | TRANSDERMAL | Status: DC

## 2015-04-23 NOTE — Progress Notes (Signed)
Hematology and Oncology Follow Up Visit  Kerri Vasquez [id number] Feb 20, 1956 59 y.o. 04/23/2015   Principle Diagnosis:  1. Metastatic breast cancer, progressive liver metastases  2. Gastric bypass for subsequent B12 and iron deficiency 3. Osteoarthritis with chronic pain 4. 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 Vitamin B12 1 mg IM monthly 3. IV iron as indicated    Interim History:  Kerri Vasquez is here today with her daughter for a follow-up after her recent hospitalization for nausea and vomiting. She has now started a PPI and is beginning to feel a little better. She has been able to eat and drink fluids. Her weight is back up 7 lbs with this visit.  She had the biopsy of her liver earlier this month which showed her mets to be ER/PR + and HER-2 negative.  Due to her progression of disease on scans in November we stopped her Faslodex and Afinitor. She still has abdominal discomfort on the right side. No more episodes of vomiting since being home.  No fever, chills, cough, rash, dizziness, chest pain, palpitations or changes in bowel or bladder habits.  She has some SOB with exertion that comes and goes.  Her CA 27-29 is now 62. She wears her compression sleeve in her right arm as needed.  No swelling numbness or tingling in her extremities at this time. She does still have joint aches and pains "all over."   Medications:    Medication List       This list is accurate as of: 04/23/15 10:36 PM.  Always use your most recent med list.               carvedilol 6.25 MG tablet  Commonly known as:  COREG  Take 6.25 mg by mouth 2 (two) times daily with a meal.     cyanocobalamin 1000 MCG/ML injection  Commonly known as:  (VITAMIN B-12)  INJECT 1 ML AS DIRECTED EVERY 21 DAYS     fentaNYL 75 MCG/HR  Commonly known as:  DURAGESIC - dosed mcg/hr  Place 1 patch (75 mcg total) onto the skin every 3 (three) days.     FLUoxetine 40 MG capsule    Commonly known as:  PROZAC  Take 40 mg by mouth daily before breakfast.     furosemide 20 MG tablet  Commonly known as:  LASIX  Take 60 mg by mouth daily.     levothyroxine 150 MCG tablet  Commonly known as:  SYNTHROID, LEVOTHROID  Take 1 tablet (150 mcg total) by mouth daily.     LORazepam 0.5 MG tablet  Commonly known as:  ATIVAN  PLACE 1 TABLET BY MOUTH UNDER THE TONGUE EVERY 6 HOURS AS NEEDED FOR NAUSEA     methocarbamol 750 MG tablet  Commonly known as:  ROBAXIN  Take 750 mg by mouth 4 (four) times daily.     MULTI-VITAMINS Tabs  Take 1 tablet by mouth daily.     nitroGLYCERIN 0.4 MG SL tablet  Commonly known as:  NITROSTAT  Place 1 tablet (0.4 mg total) under the tongue every 5 (five) minutes as needed for chest pain.     ondansetron 8 MG tablet  Commonly known as:  ZOFRAN  Take 1 tablet (8 mg total) by mouth every 8 (eight) hours as needed for nausea or vomiting.     oxycodone 5 MG capsule  Commonly known as:  OXY-IR  Take 2-4 capsules (10-20 mg total) by mouth  every 4 (four) hours.     oxymorphone 15 MG 12 hr tablet  Commonly known as:  OPANA ER  Take 15 mg by mouth every 12 (twelve) hours.     pantoprazole 40 MG tablet  Commonly known as:  PROTONIX  Take 1 tablet (40 mg total) by mouth daily at 12 noon.     polyethylene glycol packet  Commonly known as:  MIRALAX / GLYCOLAX  Take 17 g by mouth 2 (two) times daily.     potassium chloride SA 20 MEQ tablet  Commonly known as:  KLOR-CON M20  Take 2 tablets (40 mEq total) by mouth 2 (two) times daily.     pregabalin 200 MG capsule  Commonly known as:  LYRICA  Take 200 mg by mouth 3 (three) times daily.     promethazine 25 MG tablet  Commonly known as:  PHENERGAN  TAKE 1 TABLET BY MOUTH THREE TIMES DAILY AS NEEDED FOR NAUSEA     senna 8.6 MG Tabs tablet  Commonly known as:  SENOKOT  Take 2 tablets (17.2 mg total) by mouth at bedtime.     spironolactone 25 MG tablet  Commonly known as:  ALDACTONE   Take 1 tablet (25 mg total) by mouth daily.     temazepam 15 MG capsule  Commonly known as:  RESTORIL  Take 30 mg by mouth at bedtime.     tiZANidine 4 MG tablet  Commonly known as:  ZANAFLEX  Take 4 mg by mouth every 6 (six) hours as needed for muscle spasms.        Allergies:  Allergies  Allergen Reactions  . Influenza Vac Split [Flu Virus Vaccine] Other (See Comments)    Joint stiffness and renal failure  . Ritalin [Methylphenidate Hcl]     "Heart attack"  . Zostavax [Zoster Vaccine Live ([employer])] Other (See Comments)    Joint stiffness, renal failure  . Adhesive [Tape] Itching and Rash    Past Medical History, Surgical history, Social history, and Family History were reviewed and updated.  Review of Systems: All other 10 point review of systems is negative.   Physical Exam:  height is '5\' 6"'  (1.676 m) and weight is 232 lb (105.235 kg). Her oral temperature is 98 F (36.7 C). Her blood pressure is 106/60 and her pulse is 79. Her respiration is 14.   Wt Readings from Last 3 Encounters:  04/23/15 232 lb (105.235 kg)  04/21/15 225 lb 12 oz (102.4 kg)  04/08/15 218 lb (98.884 kg)    Ocular: Sclerae unicteric, pupils equal, round and reactive to light Ear-nose-throat: Oropharynx clear, dentition fair Lymphatic: No cervical supraclavicular or axillary adenopathy Lungs no rales or rhonchi, good excursion bilaterally Heart regular rate and rhythm, no murmur appreciated Abd soft, tender in right abdomen, positive bowel sounds,no liver or spleen tip palpated on exam MSK no focal spinal tenderness, no joint edema Neuro: non-focal, well-oriented, appropriate affect Breasts: Surgical changes due to bilateral mastectomies. No mass, lesion, rash or lymphadenopathy.  Lab Results  Component Value Date   WBC 5.0 04/23/2015   HGB 9.3* 04/23/2015   HCT 30.1* 04/23/2015   MCV 94 04/23/2015   PLT 107* 04/23/2015   Lab Results  Component Value Date   FERRITIN 931*  04/23/2015   IRON 56 04/23/2015   TIBC 185* 04/23/2015   UIBC 129 04/23/2015   IRONPCTSAT 30 04/23/2015   Lab Results  Component Value Date   RETICCTPCT 1.0 06/29/2014   RBC 3.22* 04/23/2015  RETICCTABS 32.3 06/29/2014   No results found for: KPAFRELGTCHN, LAMBDASER, KAPLAMBRATIO No results found for: Osborne Casco Lab Results  Component Value Date   TOTALPROTELP 4.7* 01/31/2010   ALBUMINELP 39.4* 01/31/2010   A1GS 12.1* 01/31/2010   A2GS 20.3* 01/31/2010   BETS 6.0 01/31/2010   BETA2SER 6.8* 01/31/2010   GAMS 15.4 01/31/2010   MSPIKE NOT DETECTED 01/31/2010   SPEI  01/31/2010    (NOTE) The possibility of a faint restricted band(s) cannot be completely excluded in the gamma region.  Suggest serum IFE to evaluate possibility, if clinically indicated. (Lab will hold sample one week. Please call Customer Service at [phone] to  add test.) Reviewed by Odis Hollingshead, MD, PhD, FCAP (Electronic Signature on File)     Chemistry      Component Value Date/Time   NA 148* 04/23/2015 1521   NA 143 04/21/2015 0530   NA 141 03/18/2015 0918   K 4.0 04/23/2015 1521   K 3.3* 04/21/2015 0530   K 3.8 03/18/2015 0918   CL 107 04/23/2015 1521   CL 111 04/21/2015 0530   CO2 25 04/23/2015 1521   CO2 24 04/21/2015 0530   CO2 26 03/18/2015 0918   BUN 13 04/23/2015 1521   BUN 10 04/21/2015 0530   BUN 12.5 03/18/2015 0918   CREATININE 1.3* 04/23/2015 1521   CREATININE 1.15* 04/21/2015 0530   CREATININE 1.2* 03/18/2015 0918      Component Value Date/Time   CALCIUM 6.7* 04/23/2015 1521   CALCIUM 7.9* 04/21/2015 0530   CALCIUM 9.2 03/18/2015 0918   ALKPHOS 59 04/23/2015 1521   ALKPHOS 60 04/21/2015 0530   ALKPHOS 74 03/18/2015 0918   AST 28 04/23/2015 1521   AST 17 04/21/2015 0530   AST 21 03/18/2015 0918   ALT 17 04/23/2015 1521   ALT 12* 04/21/2015 0530   ALT 11 03/18/2015 0918   BILITOT 0.50 04/23/2015 1521   BILITOT 0.8 04/21/2015 0530   BILITOT 0.57  03/18/2015 0918     Impression and Plan: Kerri Vasquez is 59 year old female with metastatic breast cancer with mets to the liver. Pet scan in November showed mild progression of lever metastasis. Biopsy of the liver mets was ER/PR + and HER-2 negative. CA 27.29 is now 59.  She was recently hospitalized with n/v. Since starting on a PPI this has resolved.  We will plan to start her treatment with Abraxane/Xgeva on January 5th. She will also have a follow-up appointment that same day.  She is still complaining of abdominal pain so we will try her on a fentanyl duragesic patch and see if this gives her some relief.  Both she and her daughter know to contact us with any questions or concerns. We can certainly see her sooner if need be.  This was a shared visit with Dr. Marin Olp and he in agreement with the afore mentioned.  Eliezer Bottom, NP 12/23/201610:36 PM     Addendum:  I saw and examined the patient with Kerri Vasquez. She clearly has progressive hepatic disease. All she is in the hospital, we did an MRI of her spine which did not show any lytic lesions in the spine. She just has terrible arthritis.  She did have a Port-A-Cath placed while in the hospital.  I think we have exhausted the ER-positive therapies. As such, I think we're are going to have to consider chemotherapy now.  Her last CA 27.29 is going out so I think this is also indicative of progressive disease.  I think that she would do well with Abraxane. I again this would be reasonable. I think she should respond to this.  While in the hospital, she did have a echocardiogram done. She has a very good ejection fraction of 55-60%.  I talked to she and her daughter at length about treatment. I explained  2 her what Abraxane did. I told that she likely would lose her hair area and this does not bother her. She may have more fatigue. Her blood counts may be affected a little bit. I don't think she will need any type of colony  stimulating factor.  With the holidays coming up, I the week away until after the new year before we have started treatment.  I would give her 2 cycles of treatment and then repeat her scans to see how things look.  I spent about 45 minutes with she and her daughter.

## 2015-04-24 LAB — CANCER ANTIGEN 27.29: CA 27.29: 59 U/mL — AB (ref 0–39)

## 2015-04-25 LAB — CULTURE, BLOOD (ROUTINE X 2)
CULTURE: NO GROWTH
Culture: NO GROWTH

## 2015-04-30 DIAGNOSIS — G894 Chronic pain syndrome: Secondary | ICD-10-CM | POA: Diagnosis not present

## 2015-04-30 DIAGNOSIS — M4726 Other spondylosis with radiculopathy, lumbar region: Secondary | ICD-10-CM | POA: Diagnosis not present

## 2015-04-30 DIAGNOSIS — Z79891 Long term (current) use of opiate analgesic: Secondary | ICD-10-CM | POA: Diagnosis not present

## 2015-04-30 DIAGNOSIS — G603 Idiopathic progressive neuropathy: Secondary | ICD-10-CM | POA: Diagnosis not present

## 2015-05-04 ENCOUNTER — Other Ambulatory Visit: Payer: Self-pay | Admitting: Hematology & Oncology

## 2015-05-06 ENCOUNTER — Encounter: Payer: Self-pay | Admitting: Family

## 2015-05-06 ENCOUNTER — Ambulatory Visit (HOSPITAL_BASED_OUTPATIENT_CLINIC_OR_DEPARTMENT_OTHER): Payer: Medicare Other | Admitting: Family

## 2015-05-06 ENCOUNTER — Ambulatory Visit (HOSPITAL_BASED_OUTPATIENT_CLINIC_OR_DEPARTMENT_OTHER): Payer: Medicare Other

## 2015-05-06 ENCOUNTER — Other Ambulatory Visit (HOSPITAL_BASED_OUTPATIENT_CLINIC_OR_DEPARTMENT_OTHER): Payer: Medicare Other

## 2015-05-06 VITALS — BP 107/60 | HR 56 | Temp 97.2°F | Resp 18 | Ht 66.0 in | Wt 240.0 lb

## 2015-05-06 DIAGNOSIS — Z9884 Bariatric surgery status: Secondary | ICD-10-CM

## 2015-05-06 DIAGNOSIS — C50919 Malignant neoplasm of unspecified site of unspecified female breast: Secondary | ICD-10-CM

## 2015-05-06 DIAGNOSIS — D500 Iron deficiency anemia secondary to blood loss (chronic): Secondary | ICD-10-CM

## 2015-05-06 DIAGNOSIS — E038 Other specified hypothyroidism: Secondary | ICD-10-CM | POA: Diagnosis not present

## 2015-05-06 DIAGNOSIS — C787 Secondary malignant neoplasm of liver and intrahepatic bile duct: Secondary | ICD-10-CM | POA: Diagnosis not present

## 2015-05-06 DIAGNOSIS — D510 Vitamin B12 deficiency anemia due to intrinsic factor deficiency: Secondary | ICD-10-CM

## 2015-05-06 DIAGNOSIS — C50912 Malignant neoplasm of unspecified site of left female breast: Secondary | ICD-10-CM

## 2015-05-06 DIAGNOSIS — Z5111 Encounter for antineoplastic chemotherapy: Secondary | ICD-10-CM

## 2015-05-06 DIAGNOSIS — D509 Iron deficiency anemia, unspecified: Secondary | ICD-10-CM

## 2015-05-06 DIAGNOSIS — E538 Deficiency of other specified B group vitamins: Secondary | ICD-10-CM

## 2015-05-06 DIAGNOSIS — C50911 Malignant neoplasm of unspecified site of right female breast: Secondary | ICD-10-CM

## 2015-05-06 LAB — CBC WITH DIFFERENTIAL (CANCER CENTER ONLY)
BASO#: 0 10*3/uL (ref 0.0–0.2)
BASO%: 0.2 % (ref 0.0–2.0)
EOS%: 2.4 % (ref 0.0–7.0)
Eosinophils Absolute: 0.1 10*3/uL (ref 0.0–0.5)
HCT: 29 % — ABNORMAL LOW (ref 34.8–46.6)
HGB: 9.1 g/dL — ABNORMAL LOW (ref 11.6–15.9)
LYMPH#: 0.9 10*3/uL (ref 0.9–3.3)
LYMPH%: 17.1 % (ref 14.0–48.0)
MCH: 30.1 pg (ref 26.0–34.0)
MCHC: 31.4 g/dL — AB (ref 32.0–36.0)
MCV: 96 fL (ref 81–101)
MONO#: 0.4 10*3/uL (ref 0.1–0.9)
MONO%: 8 % (ref 0.0–13.0)
NEUT#: 4 10*3/uL (ref 1.5–6.5)
NEUT%: 72.3 % (ref 39.6–80.0)
Platelets: 100 10*3/uL — ABNORMAL LOW (ref 145–400)
RBC: 3.02 10*6/uL — ABNORMAL LOW (ref 3.70–5.32)
RDW: 14.7 % (ref 11.1–15.7)
WBC: 5.5 10*3/uL (ref 3.9–10.0)

## 2015-05-06 LAB — CMP (CANCER CENTER ONLY)
ALT(SGPT): 11 U/L (ref 10–47)
AST: 20 U/L (ref 11–38)
Albumin: 3 g/dL — ABNORMAL LOW (ref 3.3–5.5)
Alkaline Phosphatase: 57 U/L (ref 26–84)
BILIRUBIN TOTAL: 0.6 mg/dL (ref 0.20–1.60)
BUN, Bld: 19 mg/dL (ref 7–22)
CALCIUM: 7.5 mg/dL — AB (ref 8.0–10.3)
CO2: 25 meq/L (ref 18–33)
CREATININE: 1.3 mg/dL — AB (ref 0.6–1.2)
Chloride: 108 mEq/L (ref 98–108)
GLUCOSE: 88 mg/dL (ref 73–118)
Potassium: 3.9 mEq/L (ref 3.3–4.7)
SODIUM: 143 meq/L (ref 128–145)
Total Protein: 5.8 g/dL — ABNORMAL LOW (ref 6.4–8.1)

## 2015-05-06 LAB — VITAMIN B12: Vitamin B-12: 474 pg/mL (ref 211–911)

## 2015-05-06 MED ORDER — DEXAMETHASONE 4 MG PO TABS
8.0000 mg | ORAL_TABLET | Freq: Two times a day (BID) | ORAL | Status: DC
Start: 2015-05-06 — End: 2015-06-10

## 2015-05-06 MED ORDER — LIDOCAINE-PRILOCAINE 2.5-2.5 % EX CREA: TOPICAL_CREAM | CUTANEOUS | Status: DC

## 2015-05-06 MED ORDER — LORAZEPAM 0.5 MG PO TABS: 0.5000 mg | ORAL_TABLET | Freq: Four times a day (QID) | ORAL | Status: DC | PRN

## 2015-05-06 MED ORDER — CYANOCOBALAMIN 1000 MCG/ML IJ SOLN
INTRAMUSCULAR | Status: AC
  Filled 2015-05-06: qty 1

## 2015-05-06 MED ORDER — SODIUM CHLORIDE 0.9 % IV SOLN
Freq: Once | INTRAVENOUS | Status: AC
  Administered 2015-05-06: 15:00:00 via INTRAVENOUS

## 2015-05-06 MED ORDER — SODIUM CHLORIDE 0.9 % IJ SOLN
10.0000 mL | INTRAMUSCULAR | Status: DC | PRN
  Administered 2015-05-06: 10 mL
  Filled 2015-05-06: qty 10

## 2015-05-06 MED ORDER — CYANOCOBALAMIN 1000 MCG/ML IJ SOLN
1000.0000 ug | Freq: Once | INTRAMUSCULAR | Status: AC
  Administered 2015-05-06: 1000 ug via INTRAMUSCULAR

## 2015-05-06 MED ORDER — PACLITAXEL PROTEIN-BOUND CHEMO INJECTION 100 MG
100.0000 mg/m2 | Freq: Once | INTRAVENOUS | Status: AC
  Administered 2015-05-06: 225 mg via INTRAVENOUS
  Filled 2015-05-06: qty 45

## 2015-05-06 MED ORDER — ONDANSETRON HCL 8 MG PO TABS: 8.0000 mg | ORAL_TABLET | Freq: Two times a day (BID) | ORAL | Status: DC

## 2015-05-06 MED ORDER — DENOSUMAB 120 MG/1.7ML ~~LOC~~ SOLN: 120.0000 mg | Freq: Once | SUBCUTANEOUS | Status: DC

## 2015-05-06 MED ORDER — SODIUM CHLORIDE 0.9 % IV SOLN
Freq: Once | INTRAVENOUS | Status: AC
  Administered 2015-05-06: 15:00:00 via INTRAVENOUS
  Filled 2015-05-06: qty 4

## 2015-05-06 MED ORDER — HEPARIN SOD (PORK) LOCK FLUSH 100 UNIT/ML IV SOLN
500.0000 [IU] | Freq: Once | INTRAVENOUS | Status: AC | PRN
Start: 2015-05-06 — End: 2015-05-06
  Administered 2015-05-06: 500 [IU]
  Filled 2015-05-06: qty 5

## 2015-05-06 MED ORDER — PROCHLORPERAZINE MALEATE 10 MG PO TABS: 10.0000 mg | ORAL_TABLET | Freq: Four times a day (QID) | ORAL | Status: DC | PRN

## 2015-05-06 NOTE — Patient Instructions (Signed)

## 2015-05-06 NOTE — Progress Notes (Signed)
Hematology and Oncology Follow Up Visit  Kerri Vasquez [id number] 1955-07-16 60 y.o. 05/06/2015   Principle Diagnosis:  1. Metastatic breast cancer, progressive liver metastases  2. Gastric bypass for subsequent B12 and iron deficiency 3. Osteoarthritis with chronic pain 4. 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 Abraxane q 21 days  2. Vitamin B12 1 mg IM monthly 3. IV iron as indicated    Interim History:  [name] is here today with her daughter for a follow-up and Day 1, Cycle 1 of Abraxane. She states that she is feeling better. The pain in her abdomen has resolved. She has had some mild nausea that resolves with antiemetics. No vomiting.  Her appetite has improved and she is eating much better. She is staying well hydrated. Her weight is up 8 lbs this visit. She is taking her Lasix daily to help keep the fluid retention down.  No fever, chills, cough, rash, dizziness, chest pain, palpitations or changes in bowel or bladder habits. She has SOB with exertion that comes and goes.  She has lymphedema of the right arm at times and wears a compression sleeve as needed.  No numbness or tingling in her extremities at this time. She has chronic pain in her lower back and legs. She is feeling a little better with the Fentanyl Duragesic patch.   Medications:    Medication List       This list is accurate as of: 05/06/15  2:44 PM.  Always use your most recent med list.               carvedilol 6.25 MG tablet  Commonly known as:  COREG  Take 6.25 mg by mouth 2 (two) times daily with a meal.     cyanocobalamin 1000 MCG/ML injection  Commonly known as:  (VITAMIN B-12)  INJECT 1 ML AS DIRECTED EVERY 21 DAYS     dexamethasone 4 MG tablet  Commonly known as:  DECADRON  Take 2 tablets (8 mg total) by mouth 2 (two) times daily with a meal. Start the day after chemotherapy for 2 days. Take with food.     fentaNYL 75 MCG/HR  Commonly known as:   DURAGESIC - dosed mcg/hr  Place 1 patch (75 mcg total) onto the skin every 3 (three) days.     FLUoxetine 40 MG capsule  Commonly known as:  PROZAC  Take 40 mg by mouth daily before breakfast.     furosemide 20 MG tablet  Commonly known as:  LASIX  Take 60 mg by mouth daily.     levothyroxine 150 MCG tablet  Commonly known as:  SYNTHROID, LEVOTHROID  Take 1 tablet (150 mcg total) by mouth daily.     lidocaine-prilocaine cream  Commonly known as:  EMLA  Apply to affected area once     LORazepam 0.5 MG tablet  Commonly known as:  ATIVAN  PLACE 1 TABLET UNDER THE TONGUE EVERY 6 HOURS AS NEEDED FOR NAUSEA     LORazepam 0.5 MG tablet  Commonly known as:  ATIVAN  Take 1 tablet (0.5 mg total) by mouth every 6 (six) hours as needed (Nausea or vomiting).     methocarbamol 750 MG tablet  Commonly known as:  ROBAXIN  Take 750 mg by mouth 4 (four) times daily.     MULTI-VITAMINS Tabs  Take 1 tablet by mouth daily.     nitroGLYCERIN 0.4 MG SL tablet  Commonly known as:  NITROSTAT  Place 1 tablet (0.4 mg total) under the tongue every 5 (five) minutes as needed for chest pain.     ondansetron 8 MG tablet  Commonly known as:  ZOFRAN  Take 1 tablet (8 mg total) by mouth every 8 (eight) hours as needed for nausea or vomiting.     ondansetron 8 MG tablet  Commonly known as:  ZOFRAN  Take 1 tablet (8 mg total) by mouth 2 (two) times daily. Start the day after chemo for 2 days. Then take as needed for nausea or vomiting.     oxycodone 5 MG capsule  Commonly known as:  OXY-IR  Take 2-4 capsules (10-20 mg total) by mouth every 4 (four) hours.     oxymorphone 15 MG 12 hr tablet  Commonly known as:  OPANA ER  Take 15 mg by mouth every 12 (twelve) hours.     pantoprazole 40 MG tablet  Commonly known as:  PROTONIX  Take 1 tablet (40 mg total) by mouth daily at 12 noon.     polyethylene glycol packet  Commonly known as:  MIRALAX / GLYCOLAX  Take 17 g by mouth 2 (two) times daily.       potassium chloride SA 20 MEQ tablet  Commonly known as:  KLOR-CON M20  Take 2 tablets (40 mEq total) by mouth 2 (two) times daily.     pregabalin 200 MG capsule  Commonly known as:  LYRICA  Take 200 mg by mouth 3 (three) times daily.     prochlorperazine 10 MG tablet  Commonly known as:  COMPAZINE  Take 1 tablet (10 mg total) by mouth every 6 (six) hours as needed (Nausea or vomiting).     promethazine 25 MG tablet  Commonly known as:  PHENERGAN  TAKE 1 TABLET BY MOUTH THREE TIMES DAILY AS NEEDED FOR NAUSEA     senna 8.6 MG Tabs tablet  Commonly known as:  SENOKOT  Take 2 tablets (17.2 mg total) by mouth at bedtime.     spironolactone 25 MG tablet  Commonly known as:  ALDACTONE  Take 1 tablet (25 mg total) by mouth daily.     temazepam 15 MG capsule  Commonly known as:  RESTORIL  Take 30 mg by mouth at bedtime.     tiZANidine 4 MG tablet  Commonly known as:  ZANAFLEX  Take 4 mg by mouth every 6 (six) hours as needed for muscle spasms.        Allergies:  Allergies  Allergen Reactions  . Influenza Vac Split [Flu Virus Vaccine] Other (See Comments)    Joint stiffness and renal failure  . Ritalin [Methylphenidate Hcl]     "Heart attack"  . Zostavax [Zoster Vaccine Live ([employer])] Other (See Comments)    Joint stiffness, renal failure  . Adhesive [Tape] Itching and Rash    Past Medical History, Surgical history, Social history, and Family History were reviewed and updated.  Review of Systems: All other 10 point review of systems is negative.   Physical Exam:  height is '5\' 6"'  (1.676 m) and weight is 240 lb (108.863 kg). Her oral temperature is 97.2 F (36.2 C). Her blood pressure is 107/60 and her pulse is 56. Her respiration is 18.   Wt Readings from Last 3 Encounters:  05/06/15 240 lb (108.863 kg)  04/23/15 232 lb (105.235 kg)  04/21/15 225 lb 12 oz (102.4 kg)    Ocular: Sclerae unicteric, pupils equal, round and reactive to light Ear-nose-throat:  Oropharynx clear, dentition fair  Lymphatic: No cervical supraclavicular or axillary adenopathy Lungs no rales or rhonchi, good excursion bilaterally Heart regular rate and rhythm, no murmur appreciated Abd soft, tender in right abdomen, positive bowel sounds, no liver or spleen tip palpated on exam MSK no focal spinal tenderness, no joint edema Neuro: non-focal, well-oriented, appropriate affect Breasts: Surgical changes due to bilateral mastectomies. No mass, lesion, rash or lymphadenopathy.  Lab Results  Component Value Date   WBC 5.5 05/06/2015   HGB 9.1* 05/06/2015   HCT 29.0* 05/06/2015   MCV 96 05/06/2015   PLT 100* 05/06/2015   Lab Results  Component Value Date   FERRITIN 931* 04/23/2015   IRON 56 04/23/2015   TIBC 185* 04/23/2015   UIBC 129 04/23/2015   IRONPCTSAT 30 04/23/2015   Lab Results  Component Value Date   RETICCTPCT 1.0 06/29/2014   RBC 3.02* 05/06/2015   RETICCTABS 32.3 06/29/2014   No results found for: KPAFRELGTCHN, LAMBDASER, KAPLAMBRATIO No results found for: Kandis Cocking, IGMSERUM Lab Results  Component Value Date   TOTALPROTELP 4.7* 01/31/2010   ALBUMINELP 39.4* 01/31/2010   A1GS 12.1* 01/31/2010   A2GS 20.3* 01/31/2010   BETS 6.0 01/31/2010   BETA2SER 6.8* 01/31/2010   GAMS 15.4 01/31/2010   MSPIKE NOT DETECTED 01/31/2010   SPEI  01/31/2010    (NOTE) The possibility of a faint restricted band(s) cannot be completely excluded in the gamma region.  Suggest serum IFE to evaluate possibility, if clinically indicated. (Lab will hold sample one week. Please call Customer Service at [phone] to  add test.) Reviewed by Odis Hollingshead, MD, PhD, FCAP (Electronic Signature on File)     Chemistry      Component Value Date/Time   NA 143 05/06/2015 1313   NA 143 04/21/2015 0530   NA 141 03/18/2015 0918   K 3.9 05/06/2015 1313   K 3.3* 04/21/2015 0530   K 3.8 03/18/2015 0918   CL 108 05/06/2015 1313   CL 111 04/21/2015 0530   CO2 25  05/06/2015 1313   CO2 24 04/21/2015 0530   CO2 26 03/18/2015 0918   BUN 19 05/06/2015 1313   BUN 10 04/21/2015 0530   BUN 12.5 03/18/2015 0918   CREATININE 1.3* 05/06/2015 1313   CREATININE 1.15* 04/21/2015 0530   CREATININE 1.2* 03/18/2015 0918      Component Value Date/Time   CALCIUM 7.5* 05/06/2015 1313   CALCIUM 7.9* 04/21/2015 0530   CALCIUM 9.2 03/18/2015 0918   ALKPHOS 57 05/06/2015 1313   ALKPHOS 60 04/21/2015 0530   ALKPHOS 74 03/18/2015 0918   AST 20 05/06/2015 1313   AST 17 04/21/2015 0530   AST 21 03/18/2015 0918   ALT 11 05/06/2015 1313   ALT 12* 04/21/2015 0530   ALT 11 03/18/2015 0918   BILITOT 0.60 05/06/2015 1313   BILITOT 0.8 04/21/2015 0530   BILITOT 0.57 03/18/2015 0918     Impression and Plan: [name] is 60 year old female with metastatic breast cancer with mets to the liver. Pet scan in November showed mild progression of lever metastasis. Biopsy of the liver mets was ER/PR + and HER-2 negative. She is feeling a little better and is alert and oriented today.  We will proceed with cycle 1 of Abraxane today as planned. We will plan to repeat scans after cycle 2/  She has her current appointment/treatment schedule.  Both she and her daughter know to contact us with any questions or concerns. We can certainly see her sooner if need be.  Eliezer Bottom, NP 1/5/20172:44 PM

## 2015-05-07 ENCOUNTER — Telehealth: Payer: Self-pay

## 2015-05-07 LAB — IRON AND TIBC
%SAT: 54 % (ref 21–57)
IRON: 71 ug/dL (ref 41–142)
TIBC: 130 ug/dL — AB (ref 236–444)
UIBC: 60 ug/dL — ABNORMAL LOW (ref 120–384)

## 2015-05-07 LAB — CANCER ANTIGEN 27.29: CA 27.29: 60 U/mL — AB (ref 0–39)

## 2015-05-07 LAB — FERRITIN: Ferritin: 732 ng/ml — ABNORMAL HIGH (ref 9–269)

## 2015-05-07 LAB — TSH: TSH: <0.08 m(IU)/L — ABNORMAL LOW (ref 0.308–3.960)

## 2015-05-07 NOTE — Telephone Encounter (Signed)
Call to check on patient as chemotherapy follow up. No one answer left message for patient to call us if the needed anyting.   By Jennell Corner, RN

## 2015-05-12 ENCOUNTER — Other Ambulatory Visit: Payer: Self-pay | Admitting: *Deleted

## 2015-05-12 DIAGNOSIS — C50912 Malignant neoplasm of unspecified site of left female breast: Secondary | ICD-10-CM

## 2015-05-12 DIAGNOSIS — C787 Secondary malignant neoplasm of liver and intrahepatic bile duct: Principal | ICD-10-CM

## 2015-05-13 ENCOUNTER — Encounter: Payer: Self-pay | Admitting: Family

## 2015-05-13 ENCOUNTER — Other Ambulatory Visit: Payer: Self-pay | Admitting: *Deleted

## 2015-05-13 ENCOUNTER — Ambulatory Visit (HOSPITAL_BASED_OUTPATIENT_CLINIC_OR_DEPARTMENT_OTHER): Payer: Medicare Other | Admitting: Family

## 2015-05-13 ENCOUNTER — Ambulatory Visit (HOSPITAL_BASED_OUTPATIENT_CLINIC_OR_DEPARTMENT_OTHER): Payer: Medicare Other

## 2015-05-13 ENCOUNTER — Other Ambulatory Visit (HOSPITAL_BASED_OUTPATIENT_CLINIC_OR_DEPARTMENT_OTHER): Payer: Medicare Other

## 2015-05-13 VITALS — BP 105/51 | HR 58 | Temp 97.9°F | Resp 14 | Ht 66.0 in | Wt 230.0 lb

## 2015-05-13 DIAGNOSIS — E538 Deficiency of other specified B group vitamins: Secondary | ICD-10-CM | POA: Diagnosis not present

## 2015-05-13 DIAGNOSIS — E611 Iron deficiency: Secondary | ICD-10-CM | POA: Diagnosis not present

## 2015-05-13 DIAGNOSIS — C50919 Malignant neoplasm of unspecified site of unspecified female breast: Secondary | ICD-10-CM

## 2015-05-13 DIAGNOSIS — M1990 Unspecified osteoarthritis, unspecified site: Secondary | ICD-10-CM

## 2015-05-13 DIAGNOSIS — C787 Secondary malignant neoplasm of liver and intrahepatic bile duct: Secondary | ICD-10-CM

## 2015-05-13 DIAGNOSIS — R530 Neoplastic (malignant) related fatigue: Secondary | ICD-10-CM

## 2015-05-13 DIAGNOSIS — D34 Benign neoplasm of thyroid gland: Secondary | ICD-10-CM | POA: Diagnosis not present

## 2015-05-13 DIAGNOSIS — Z5111 Encounter for antineoplastic chemotherapy: Secondary | ICD-10-CM | POA: Diagnosis present

## 2015-05-13 DIAGNOSIS — C50912 Malignant neoplasm of unspecified site of left female breast: Secondary | ICD-10-CM

## 2015-05-13 DIAGNOSIS — D510 Vitamin B12 deficiency anemia due to intrinsic factor deficiency: Secondary | ICD-10-CM

## 2015-05-13 DIAGNOSIS — R112 Nausea with vomiting, unspecified: Secondary | ICD-10-CM

## 2015-05-13 LAB — CMP (CANCER CENTER ONLY)
ALK PHOS: 56 U/L (ref 26–84)
ALT: 16 U/L (ref 10–47)
AST: 15 U/L (ref 11–38)
Albumin: 2.9 g/dL — ABNORMAL LOW (ref 3.3–5.5)
BUN: 23 mg/dL — AB (ref 7–22)
CALCIUM: 7.7 mg/dL — AB (ref 8.0–10.3)
CO2: 25 mEq/L (ref 18–33)
Chloride: 108 mEq/L (ref 98–108)
Creat: 1.4 mg/dl — ABNORMAL HIGH (ref 0.6–1.2)
Glucose, Bld: 85 mg/dL (ref 73–118)
POTASSIUM: 4.1 meq/L (ref 3.3–4.7)
Sodium: 145 mEq/L (ref 128–145)
TOTAL PROTEIN: 6.4 g/dL (ref 6.4–8.1)
Total Bilirubin: 0.5 mg/dl (ref 0.20–1.60)

## 2015-05-13 LAB — CBC WITH DIFFERENTIAL (CANCER CENTER ONLY)
BASO#: 0 10*3/uL (ref 0.0–0.2)
BASO%: 0.3 % (ref 0.0–2.0)
EOS ABS: 0.3 10*3/uL (ref 0.0–0.5)
EOS%: 4.6 % (ref 0.0–7.0)
HCT: 31.8 % — ABNORMAL LOW (ref 34.8–46.6)
HGB: 10 g/dL — ABNORMAL LOW (ref 11.6–15.9)
LYMPH#: 1.3 10*3/uL (ref 0.9–3.3)
LYMPH%: 21.8 % (ref 14.0–48.0)
MCH: 30 pg (ref 26.0–34.0)
MCHC: 31.4 g/dL — ABNORMAL LOW (ref 32.0–36.0)
MCV: 96 fL (ref 81–101)
MONO#: 0.1 10*3/uL (ref 0.1–0.9)
MONO%: 2.4 % (ref 0.0–13.0)
NEUT#: 4.2 10*3/uL (ref 1.5–6.5)
NEUT%: 70.9 % (ref 39.6–80.0)
Platelets: 133 10*3/uL — ABNORMAL LOW (ref 145–400)
RBC: 3.33 10*6/uL — AB (ref 3.70–5.32)
RDW: 14.9 % (ref 11.1–15.7)
WBC: 5.9 10*3/uL (ref 3.9–10.0)

## 2015-05-13 MED ORDER — DENOSUMAB 120 MG/1.7ML ~~LOC~~ SOLN
120.0000 mg | Freq: Once | SUBCUTANEOUS | Status: AC
  Administered 2015-05-13: 120 mg via SUBCUTANEOUS
  Filled 2015-05-13: qty 1.7

## 2015-05-13 MED ORDER — SODIUM CHLORIDE 0.9 % IV SOLN
Freq: Once | INTRAVENOUS | Status: DC
  Filled 2015-05-13: qty 4

## 2015-05-13 MED ORDER — PALONOSETRON HCL INJECTION 0.25 MG/5ML
0.2500 mg | Freq: Once | INTRAVENOUS | Status: AC
  Administered 2015-05-13: 0.25 mg via INTRAVENOUS

## 2015-05-13 MED ORDER — PALONOSETRON HCL INJECTION 0.25 MG/5ML
INTRAVENOUS | Status: AC
  Filled 2015-05-13: qty 5

## 2015-05-13 MED ORDER — SODIUM CHLORIDE 0.9 % IV SOLN
Freq: Once | INTRAVENOUS | Status: AC
  Administered 2015-05-13: 10:00:00 via INTRAVENOUS

## 2015-05-13 MED ORDER — PACLITAXEL PROTEIN-BOUND CHEMO INJECTION 100 MG
100.0000 mg/m2 | Freq: Once | INTRAVENOUS | Status: AC
  Administered 2015-05-13: 225 mg via INTRAVENOUS
  Filled 2015-05-13: qty 45

## 2015-05-13 MED ORDER — SODIUM CHLORIDE 0.9 % IV SOLN
Freq: Once | INTRAVENOUS | Status: AC
  Administered 2015-05-13: 11:00:00 via INTRAVENOUS
  Filled 2015-05-13: qty 5

## 2015-05-13 MED ORDER — HEPARIN SOD (PORK) LOCK FLUSH 100 UNIT/ML IV SOLN
500.0000 [IU] | Freq: Once | INTRAVENOUS | Status: AC | PRN
  Administered 2015-05-13: 500 [IU]
  Filled 2015-05-13: qty 5

## 2015-05-13 MED ORDER — SODIUM CHLORIDE 0.9 % IJ SOLN
10.0000 mL | INTRAMUSCULAR | Status: DC | PRN
  Administered 2015-05-13: 10 mL
  Filled 2015-05-13: qty 10

## 2015-05-13 NOTE — Progress Notes (Signed)
Hematology and Oncology Follow Up Visit  Kerri Vasquez [id number] 27-Aug-1955 60 y.o. 05/13/2015   Principle Diagnosis:  1. Metastatic breast cancer, progressive liver metastases  2. Gastric bypass for subsequent B12 and iron deficiency 3. Osteoarthritis with chronic pain 4. 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 Abraxane q 21 days  2. Vitamin B12 1 mg IM monthly 3. IV iron as indicated    Interim History:  [name] is here today with her daughter for a follow-up and Day 8, Cycle 1 of Abraxane. She had a good bit of nausea and a few episodes of vomiting. She has also had diarrhea. She is feeling fatigued and staying in bed a good deal of the time.  Her appetite comes and goes but she states that she has been eating and drinking fluids. Her weight is down 10 lbs this visit.  No fever, chills, cough, rash, dizziness, chest pain, palpitations or changes in bladder habits. She has SOB with exertion that comes and goes.  No lymphadenopathy found on exam.  No numbness or tingling in her extremities at this time. Her chronic pain in the lower back and legs is unchanged.   Medications:    Medication List       This list is accurate as of: 05/13/15 11:19 AM.  Always use your most recent med list.               carvedilol 6.25 MG tablet  Commonly known as:  COREG  Take 6.25 mg by mouth 2 (two) times daily with a meal.     cyanocobalamin 1000 MCG/ML injection  Commonly known as:  (VITAMIN B-12)  INJECT 1 ML AS DIRECTED EVERY 21 DAYS     dexamethasone 4 MG tablet  Commonly known as:  DECADRON  Take 2 tablets (8 mg total) by mouth 2 (two) times daily with a meal. Start the day after chemotherapy for 2 days. Take with food.     fentaNYL 75 MCG/HR  Commonly known as:  DURAGESIC - dosed mcg/hr  Place 1 patch (75 mcg total) onto the skin every 3 (three) days.     FLUoxetine 40 MG capsule  Commonly known as:  PROZAC  Take 40 mg by mouth daily  before breakfast.     furosemide 20 MG tablet  Commonly known as:  LASIX  Take 60 mg by mouth daily.     levothyroxine 150 MCG tablet  Commonly known as:  SYNTHROID, LEVOTHROID  Take 1 tablet (150 mcg total) by mouth daily.     lidocaine-prilocaine cream  Commonly known as:  EMLA  Apply to affected area once     LORazepam 0.5 MG tablet  Commonly known as:  ATIVAN  PLACE 1 TABLET UNDER THE TONGUE EVERY 6 HOURS AS NEEDED FOR NAUSEA     LORazepam 0.5 MG tablet  Commonly known as:  ATIVAN  Take 1 tablet (0.5 mg total) by mouth every 6 (six) hours as needed (Nausea or vomiting).     methocarbamol 750 MG tablet  Commonly known as:  ROBAXIN  Take 750 mg by mouth 4 (four) times daily.     MULTI-VITAMINS Tabs  Take 1 tablet by mouth daily.     nitroGLYCERIN 0.4 MG SL tablet  Commonly known as:  NITROSTAT  Place 1 tablet (0.4 mg total) under the tongue every 5 (five) minutes as needed for chest pain.     ondansetron 8 MG tablet  Commonly known  as:  ZOFRAN  Take 1 tablet (8 mg total) by mouth every 8 (eight) hours as needed for nausea or vomiting.     ondansetron 8 MG tablet  Commonly known as:  ZOFRAN  Take 1 tablet (8 mg total) by mouth 2 (two) times daily. Start the day after chemo for 2 days. Then take as needed for nausea or vomiting.     oxycodone 5 MG capsule  Commonly known as:  OXY-IR  Take 2-4 capsules (10-20 mg total) by mouth every 4 (four) hours.     oxymorphone 15 MG 12 hr tablet  Commonly known as:  OPANA ER  Take 15 mg by mouth every 12 (twelve) hours.     pantoprazole 40 MG tablet  Commonly known as:  PROTONIX  Take 1 tablet (40 mg total) by mouth daily at 12 noon.     polyethylene glycol packet  Commonly known as:  MIRALAX / GLYCOLAX  Take 17 g by mouth 2 (two) times daily.     potassium chloride SA 20 MEQ tablet  Commonly known as:  KLOR-CON M20  Take 2 tablets (40 mEq total) by mouth 2 (two) times daily.     pregabalin 200 MG capsule  Commonly  known as:  LYRICA  Take 200 mg by mouth 3 (three) times daily.     prochlorperazine 10 MG tablet  Commonly known as:  COMPAZINE  Take 1 tablet (10 mg total) by mouth every 6 (six) hours as needed (Nausea or vomiting).     promethazine 25 MG tablet  Commonly known as:  PHENERGAN  TAKE 1 TABLET BY MOUTH THREE TIMES DAILY AS NEEDED FOR NAUSEA     senna 8.6 MG Tabs tablet  Commonly known as:  SENOKOT  Take 2 tablets (17.2 mg total) by mouth at bedtime.     spironolactone 25 MG tablet  Commonly known as:  ALDACTONE  Take 1 tablet (25 mg total) by mouth daily.     temazepam 15 MG capsule  Commonly known as:  RESTORIL  Take 30 mg by mouth at bedtime.     tiZANidine 4 MG tablet  Commonly known as:  ZANAFLEX  Take 4 mg by mouth every 6 (six) hours as needed for muscle spasms.        Allergies:  Allergies  Allergen Reactions  . Influenza Vac Split [Flu Virus Vaccine] Other (See Comments)    Joint stiffness and renal failure  . Ritalin [Methylphenidate Hcl]     "Heart attack"  . Zostavax [Zoster Vaccine Live ([employer])] Other (See Comments)    Joint stiffness, renal failure  . Adhesive [Tape] Itching and Rash    Past Medical History, Surgical history, Social history, and Family History were reviewed and updated.  Review of Systems: All other 10 point review of systems is negative.   Physical Exam:  height is 5\' 6"  (1.676 m) and weight is 230 lb (104.327 kg). Her oral temperature is 97.9 F (36.6 C). Her blood pressure is 105/51 and her pulse is 58. Her respiration is 14.   Wt Readings from Last 3 Encounters:  05/13/15 230 lb (104.327 kg)  05/06/15 240 lb (108.863 kg)  04/23/15 232 lb (105.235 kg)    Ocular: Sclerae unicteric, pupils equal, round and reactive to light Ear-nose-throat: Oropharynx clear, dentition fair Lymphatic: No cervical supraclavicular or axillary adenopathy Lungs no rales or rhonchi, good excursion bilaterally Heart regular rate and rhythm, no  murmur appreciated Abd soft, tender in right abdomen, positive bowel sounds,  no liver or spleen tip palpated on exam MSK no focal spinal tenderness, no joint edema Neuro: non-focal, well-oriented, appropriate affect Breasts: Surgical changes due to bilateral mastectomies. No mass, lesion, rash or lymphadenopathy.  Lab Results  Component Value Date   WBC 5.9 05/13/2015   HGB 10.0* 05/13/2015   HCT 31.8* 05/13/2015   MCV 96 05/13/2015   PLT 133* 05/13/2015   Lab Results  Component Value Date   FERRITIN 732* 05/06/2015   IRON 71 05/06/2015   TIBC 130* 05/06/2015   UIBC 60* 05/06/2015   IRONPCTSAT 54 05/06/2015   Lab Results  Component Value Date   RETICCTPCT 1.0 06/29/2014   RBC 3.33* 05/13/2015   RETICCTABS 32.3 06/29/2014   No results found for: KPAFRELGTCHN, LAMBDASER, KAPLAMBRATIO No results found for: Kandis Cocking, IGMSERUM Lab Results  Component Value Date   TOTALPROTELP 4.7* 01/31/2010   ALBUMINELP 39.4* 01/31/2010   A1GS 12.1* 01/31/2010   A2GS 20.3* 01/31/2010   BETS 6.0 01/31/2010   BETA2SER 6.8* 01/31/2010   GAMS 15.4 01/31/2010   MSPIKE NOT DETECTED 01/31/2010   SPEI  01/31/2010    (NOTE) The possibility of a faint restricted band(s) cannot be completely excluded in the gamma region.  Suggest serum IFE to evaluate possibility, if clinically indicated. (Lab will hold sample one week. Please call Customer Service at [phone] to  add test.) Reviewed by Odis Hollingshead, MD, PhD, FCAP (Electronic Signature on File)     Chemistry      Component Value Date/Time   NA 145 05/13/2015 0915   NA 143 04/21/2015 0530   NA 141 03/18/2015 0918   K 4.1 05/13/2015 0915   K 3.3* 04/21/2015 0530   K 3.8 03/18/2015 0918   CL 108 05/13/2015 0915   CL 111 04/21/2015 0530   CO2 25 05/13/2015 0915   CO2 24 04/21/2015 0530   CO2 26 03/18/2015 0918   BUN 23* 05/13/2015 0915   BUN 10 04/21/2015 0530   BUN 12.5 03/18/2015 0918   CREATININE 1.4* 05/13/2015 0915    CREATININE 1.15* 04/21/2015 0530   CREATININE 1.2* 03/18/2015 0918      Component Value Date/Time   CALCIUM 7.7* 05/13/2015 0915   CALCIUM 7.9* 04/21/2015 0530   CALCIUM 9.2 03/18/2015 0918   ALKPHOS 56 05/13/2015 0915   ALKPHOS 60 04/21/2015 0530   ALKPHOS 74 03/18/2015 0918   AST 15 05/13/2015 0915   AST 17 04/21/2015 0530   AST 21 03/18/2015 0918   ALT 16 05/13/2015 0915   ALT 12* 04/21/2015 0530   ALT 11 03/18/2015 0918   BILITOT 0.50 05/13/2015 0915   BILITOT 0.8 04/21/2015 0530   BILITOT 0.57 03/18/2015 0918     Impression and Plan: [name] is 60 year old female with metastatic breast cancer with mets to the liver. She is now being treated with Abraxane. She had n/v/d since she started and is feeling fatigued.  We will give her Aloxi and Emend today prior to her treatment and see if this will help with her symptoms.   She has her current appointment/treatment schedule. We will repeat scans on her after cycle 2.  Both she and her daughter know to contact us with any questions or concerns. We can certainly see her sooner if need be.   Eliezer Bottom, NP 1/12/201711:19 AM

## 2015-05-13 NOTE — Patient Instructions (Signed)

## 2015-05-14 ENCOUNTER — Encounter: Payer: Self-pay | Admitting: Hematology & Oncology

## 2015-05-16 ENCOUNTER — Other Ambulatory Visit: Payer: Self-pay | Admitting: Hematology & Oncology

## 2015-05-16 ENCOUNTER — Other Ambulatory Visit: Payer: Self-pay | Admitting: Family

## 2015-05-17 ENCOUNTER — Other Ambulatory Visit: Payer: Self-pay | Admitting: *Deleted

## 2015-05-17 DIAGNOSIS — R1110 Vomiting, unspecified: Secondary | ICD-10-CM

## 2015-05-17 MED ORDER — LORAZEPAM 0.5 MG PO TABS: ORAL_TABLET | ORAL | Status: DC

## 2015-05-19 DIAGNOSIS — Z79891 Long term (current) use of opiate analgesic: Secondary | ICD-10-CM | POA: Diagnosis not present

## 2015-05-19 DIAGNOSIS — G603 Idiopathic progressive neuropathy: Secondary | ICD-10-CM | POA: Diagnosis not present

## 2015-05-19 DIAGNOSIS — G894 Chronic pain syndrome: Secondary | ICD-10-CM | POA: Diagnosis not present

## 2015-05-19 DIAGNOSIS — M4726 Other spondylosis with radiculopathy, lumbar region: Secondary | ICD-10-CM | POA: Diagnosis not present

## 2015-05-20 NOTE — ED Provider Notes (Signed)
CSN: [id number]     Arrival date & time 04/18/15  2344 History   First MD Initiated Contact with Patient 04/19/15 0018     Chief Complaint  Patient presents with  . Emesis  . Chest Pain     (Consider location/radiation/quality/duration/timing/severity/associated sxs/prior Treatment) HPI  Kerri Vasquez is a 60 y.o. female with hx of UTI, kidney stones, a MI in March, and cholecystectomy who presents to the Emergency Department complaining of a heavy chest pain and vomiting onset five hours ago. Pt also reports associated nausea, chills, and diaphoresis. She notes that her pain and symptoms are similar to when she experienced an intestinal blockage in 2013 and she also notes that she curretnly receives chemotherapy for treatment of her liver cancer. Pt reports that she has not eaten anything today. She states that her last normal bowel movement was this morning. Pt has taken ativan, percocet, and oxycodone and has tried drinking ginger ale with no relief of symptoms before vomiting her medications back up. Pt has recently had a sick contact being that her sister recently had the flu. She also notes that she was given zofran and ativan in past when experiencing similar symptoms. She denies SOB, fever, blood in stool, cough and rash.   Past Medical History  Diagnosis Date  . Depression   . Hypertension   . Hypothyroidism   . Incisional hernia   . Spinal stenosis   . Scoliosis   . Sciatica   . Intestinal obstruction ([place])   . GERD (gastroesophageal reflux disease)   . Septic arthritis ([place]) 01/28/10    and spinal stenosis , moderate scoliosis   . Osteoarthritis of multiple joints   . Sleep apnea     hx of off machine x 4 years   . Renal insufficiency     hx renal failure 2011  . Fibromyalgia   . Fracture of multiple toes     right toes x 4 toes- excluding small toe  . Diabetes mellitus     type II, controlled by diet 05/25/11   . CHF (congestive heart failure) ([place])   . Anemia    . Pernicious anemia 05/30/2011    Iron transfusion and VB12 on 06/06/11  . Migraine     "weekly; but they don't last long" (08/26/2014)  . Chronic lower back pain   . Breast cancer, right breast ([place])   . Liver cancer ([place])     "twice"  . Thyroid cancer ([place])   . Myocardial infarction ([place]) 06/2014    S/P percutaneous intervention procedure a metastatic lesion in the liver  . Nausea and vomiting 04/2015   Past Surgical History  Procedure Laterality Date  . Tonsillectomy  1969  . Multiple extractions with alveoloplasty  06/08/2011    Procedure: MULTIPLE EXTRACION WITH ALVEOLOPLASTY;  Surgeon: Lenn Cal, DDS;  Location: [facility];  Service: Oral Surgery;  Laterality: N/A;  Extraction of tooth #'s 3,4,7 with alveoloplasty and Gross Debridement of Remaining Teeth  . Gastric bypass  2006    "w/scope"  . Colectomy  2011    "SBO"  . Radiofrequency ablation liver tumor  2011; 01/26/2012  . Thyroid lobectomy  02/16/2012    Procedure: THYROID LOBECTOMY;  Surgeon: Earnstine Regal, MD;  Location: [facility];  Service: General;  Laterality: Right;  . Left heart catheterization with coronary angiogram N/A 07/14/2014    Procedure: LEFT HEART CATHETERIZATION WITH CORONARY ANGIOGRAM;  Surgeon: Leonie Man, MD;  Location: [facility] CATH LAB;  Service: Cardiovascular;  Laterality: N/A;  . Cholecystectomy open  06/29/85  . Breast implant removal Left 2015  . Mastectomy Bilateral 2008  . Breast biopsy Right 2008  . Breast reconstruction Bilateral 2008; 2009    "w/the mastectomy; "  . Breast reconstruction Left ~ 2010    "it capsized"  . Cardiac catheterization    . Liver biopsy  04/2015   Family History  Problem Relation Age of Onset  . Cancer Mother     breast  . Hypertension Mother   . Anesthesia problems Daughter    Social History  Substance Use Topics  . Smoking status: Never Smoker   . Smokeless tobacco: Never Used     Comment: NEVER USED TOBACCO  . Alcohol Use: No   OB History    No data  available     Review of Systems  Constitutional: Positive for chills. Negative for fever.  Eyes: Negative for pain.  Endocrine: Negative for polydipsia and polyuria.  All other systems reviewed and are negative.     Allergies  Influenza vac split; Ritalin; Zostavax; and Adhesive  Home Medications   Prior to Admission medications   Medication Sig Start Date End Date Taking? Authorizing Provider  carvedilol (COREG) 6.25 MG tablet Take 6.25 mg by mouth 2 (two) times daily with a meal.   Yes Historical Provider, MD  cyanocobalamin (,VITAMIN B-12,) 1000 MCG/ML injection INJECT 1 ML AS DIRECTED EVERY 21 DAYS 03/18/15  Yes Icard, NP  FLUoxetine (PROZAC) 40 MG capsule Take 40 mg by mouth daily before breakfast.    Yes Historical Provider, MD  furosemide (LASIX) 20 MG tablet Take 60 mg by mouth daily.    Yes Historical Provider, MD  methocarbamol (ROBAXIN) 750 MG tablet Take 750 mg by mouth 4 (four) times daily.   Yes Historical Provider, MD  Multiple Vitamin (MULTI-VITAMINS) TABS Take 1 tablet by mouth daily. 11/29/11  Yes Historical Provider, MD  oxymorphone (OPANA ER) 15 MG 12 hr tablet Take 15 mg by mouth every 12 (twelve) hours.   Yes Historical Provider, MD  potassium chloride SA (KLOR-CON M20) 20 MEQ tablet Take 2 tablets (40 mEq total) by mouth 2 (two) times daily. Patient taking differently: Take 40 mEq by mouth daily.  09/04/14  Yes Larey Dresser, MD  pregabalin (LYRICA) 200 MG capsule Take 200 mg by mouth 3 (three) times daily.   Yes Historical Provider, MD  spironolactone (ALDACTONE) 25 MG tablet Take 1 tablet (25 mg total) by mouth daily. 03/15/15  Yes Shaune Pascal Bensimhon, MD  temazepam (RESTORIL) 15 MG capsule Take 30 mg by mouth at bedtime.   Yes Historical Provider, MD  tiZANidine (ZANAFLEX) 4 MG tablet Take 4 mg by mouth every 6 (six) hours as needed for muscle spasms.   Yes Historical Provider, MD  dexamethasone (DECADRON) 4 MG tablet Take 2 tablets (8 mg total) by  mouth 2 (two) times daily with a meal. Start the day after chemotherapy for 2 days. Take with food. 05/06/15   Volanda Napoleon, MD  fentaNYL (DURAGESIC - DOSED MCG/HR) 75 MCG/HR Place 1 patch (75 mcg total) onto the skin every 3 (three) days. 04/23/15   Volanda Napoleon, MD  levothyroxine (SYNTHROID, LEVOTHROID) 150 MCG tablet Take 1 tablet (150 mcg total) by mouth daily. 04/21/15   Shanker Kristeen Mans, MD  lidocaine-prilocaine (EMLA) cream Apply to affected area once 05/06/15   Volanda Napoleon, MD  LORazepam (ATIVAN) 0.5 MG tablet PLACE 1 TABLET UNDER  THE TONGUE EVERY 6 HOURS AS NEEDED FOR NAUSEA 05/17/15   Volanda Napoleon, MD  nitroGLYCERIN (NITROSTAT) 0.4 MG SL tablet Place 1 tablet (0.4 mg total) under the tongue every 5 (five) minutes as needed for chest pain. 07/18/14   Annita Brod, MD  ondansetron (ZOFRAN) 8 MG tablet Take 1 tablet (8 mg total) by mouth every 8 (eight) hours as needed for nausea or vomiting. 04/21/15   Jonetta Osgood, MD  ondansetron (ZOFRAN) 8 MG tablet Take 1 tablet (8 mg total) by mouth 2 (two) times daily. Start the day after chemo for 2 days. Then take as needed for nausea or vomiting. 05/06/15   Volanda Napoleon, MD  oxycodone (OXY-IR) 5 MG capsule Take 2-4 capsules (10-20 mg total) by mouth every 4 (four) hours. 04/21/15   Shanker Kristeen Mans, MD  pantoprazole (PROTONIX) 40 MG tablet Take 1 tablet (40 mg total) by mouth daily at 12 noon. 04/21/15   Shanker Kristeen Mans, MD  polyethylene glycol (MIRALAX / GLYCOLAX) packet Take 17 g by mouth 2 (two) times daily. 04/21/15   Shanker Kristeen Mans, MD  prochlorperazine (COMPAZINE) 10 MG tablet Take 1 tablet (10 mg total) by mouth every 6 (six) hours as needed (Nausea or vomiting). 05/06/15   Volanda Napoleon, MD  promethazine (PHENERGAN) 25 MG tablet TAKE 1 TABLET BY MOUTH THREE TIMES DAILY AS NEEDED FOR NAUSEA 05/17/15   Eliezer Bottom, NP  senna (SENOKOT) 8.6 MG TABS tablet Take 2 tablets (17.2 mg total) by mouth at bedtime. 04/21/15    Shanker Kristeen Mans, MD   BP 105/43 mmHg  Pulse 72  Temp(Src) 97.5 F (36.4 C) (Oral)  Resp 21  Ht 5\' 6"  (1.676 m)  Wt 225 lb 12 oz (102.4 kg)  BMI 36.45 kg/m2  SpO2 97%  LMP 08/30/2006 Physical Exam  Constitutional: She appears well-developed and well-nourished.  HENT:  Head: Normocephalic and atraumatic.  Neck: Normal range of motion.  Cardiovascular: Normal rate and regular rhythm.   Pulmonary/Chest: No stridor. No respiratory distress.  Abdominal: She exhibits no distension. There is tenderness. There is no guarding.  Neurological: She is alert.  Skin: Skin is warm and dry.  Nursing note and vitals reviewed.   ED Course  Procedures (including critical care time) Labs Review Labs Reviewed  MRSA PCR SCREENING - Abnormal; Notable for the following:    MRSA by PCR POSITIVE (*)    All other components within normal limits  BASIC METABOLIC PANEL - Abnormal; Notable for the following:    CO2 20 (*)    Glucose, Bld 136 (*)    Creatinine, Ser 1.29 (*)    GFR calc non Af Amer 44 (*)    GFR calc Af Amer 51 (*)    All other components within normal limits  CBC - Abnormal; Notable for the following:    Hemoglobin 11.6 (*)    Platelets 97 (*)    All other components within normal limits  TROPONIN I - Abnormal; Notable for the following:    Troponin I 0.04 (*)    All other components within normal limits  TSH - Abnormal; Notable for the following:    TSH 0.020 (*)    All other components within normal limits  TROPONIN I - Abnormal; Notable for the following:    Troponin I 0.25 (*)    All other components within normal limits  BASIC METABOLIC PANEL - Abnormal; Notable for the following:    CO2 21 (*)  Creatinine, Ser 1.10 (*)    Calcium 8.5 (*)    GFR calc non Af Amer 53 (*)    All other components within normal limits  TROPONIN I - Abnormal; Notable for the following:    Troponin I 0.27 (*)    All other components within normal limits  CANCER ANTIGEN 27.29 -  Abnormal; Notable for the following:    CA 27.29 47.9 (*)    All other components within normal limits  CBC WITH DIFFERENTIAL/PLATELET - Abnormal; Notable for the following:    RBC 3.46 (*)    Hemoglobin 10.1 (*)    HCT 31.7 (*)    Platelets 90 (*)    All other components within normal limits  SEDIMENTATION RATE - Abnormal; Notable for the following:    Sed Rate 40 (*)    All other components within normal limits  COMPREHENSIVE METABOLIC PANEL - Abnormal; Notable for the following:    Potassium 3.3 (*)    Creatinine, Ser 1.15 (*)    Calcium 7.9 (*)    Total Protein 5.3 (*)    Albumin 2.6 (*)    ALT 12 (*)    GFR calc non Af Amer 51 (*)    GFR calc Af Amer 59 (*)    All other components within normal limits  CBC WITH DIFFERENTIAL/PLATELET - Abnormal; Notable for the following:    WBC 3.5 (*)    RBC 3.09 (*)    Hemoglobin 9.0 (*)    HCT 28.0 (*)    Platelets 76 (*)    All other components within normal limits  CULTURE, BLOOD (ROUTINE X 2)  CULTURE, BLOOD (ROUTINE X 2)  HEPATIC FUNCTION PANEL  BILIRUBIN, TOTAL  INFLUENZA PANEL BY PCR (TYPE A & B, H1N1)  HEMOGLOBIN A1C  PROTIME-INR  C-REACTIVE PROTEIN  I-STAT TROPOININ, ED    Imaging Review No results found. I have personally reviewed and evaluated these images and lab results as part of my medical decision-making.   EKG Interpretation   Date/Time:  Sunday April 18 2015 23:48:32 EST Ventricular Rate:  72 PR Interval:  160 QRS Duration: 78 QT Interval:  402 QTC Calculation: 440 R Axis:   17 Text Interpretation:  Normal sinus rhythm Normal ECG Confirmed by Paitlyn Mcclatchey  MD, Corene Cornea [phone]) on 04/19/2015 12:18:09 AM      MDM   Final diagnoses:  Breast cancer metastasized to bone [facility])  Breast cancer metastasized to liver, left (HCC)  Metastatic carcinoma of breast (HCC)  Breast cancer metastasized to liver, unspecified laterality [facility])    60 yo F q/ h/o cancer and h/o recent MI here with chest pain and  intractable vomiting. Exam as above, ct scan w/o obvious abnormality. Initial troponin negative, ECG without ischemia. Admitted for further management.     Merrily Pew, MD 05/20/15 [phone]

## 2015-05-27 ENCOUNTER — Other Ambulatory Visit: Payer: Medicare Other

## 2015-05-27 ENCOUNTER — Ambulatory Visit: Payer: Self-pay | Admitting: Hematology & Oncology

## 2015-05-27 ENCOUNTER — Ambulatory Visit: Payer: Self-pay

## 2015-05-31 ENCOUNTER — Other Ambulatory Visit: Payer: Self-pay | Admitting: Hematology & Oncology

## 2015-05-31 ENCOUNTER — Encounter: Payer: Self-pay | Admitting: Hematology & Oncology

## 2015-06-03 ENCOUNTER — Other Ambulatory Visit (HOSPITAL_BASED_OUTPATIENT_CLINIC_OR_DEPARTMENT_OTHER): Payer: Medicare Other

## 2015-06-03 ENCOUNTER — Ambulatory Visit (HOSPITAL_BASED_OUTPATIENT_CLINIC_OR_DEPARTMENT_OTHER): Payer: Medicare Other

## 2015-06-03 ENCOUNTER — Ambulatory Visit (HOSPITAL_BASED_OUTPATIENT_CLINIC_OR_DEPARTMENT_OTHER): Payer: Medicare Other | Admitting: Hematology & Oncology

## 2015-06-03 DIAGNOSIS — E538 Deficiency of other specified B group vitamins: Secondary | ICD-10-CM

## 2015-06-03 DIAGNOSIS — C50919 Malignant neoplasm of unspecified site of unspecified female breast: Secondary | ICD-10-CM | POA: Diagnosis not present

## 2015-06-03 DIAGNOSIS — C50911 Malignant neoplasm of unspecified site of right female breast: Secondary | ICD-10-CM

## 2015-06-03 DIAGNOSIS — C50912 Malignant neoplasm of unspecified site of left female breast: Secondary | ICD-10-CM | POA: Diagnosis not present

## 2015-06-03 DIAGNOSIS — Z5111 Encounter for antineoplastic chemotherapy: Secondary | ICD-10-CM | POA: Diagnosis present

## 2015-06-03 DIAGNOSIS — D510 Vitamin B12 deficiency anemia due to intrinsic factor deficiency: Secondary | ICD-10-CM

## 2015-06-03 DIAGNOSIS — G8929 Other chronic pain: Secondary | ICD-10-CM | POA: Diagnosis not present

## 2015-06-03 DIAGNOSIS — D500 Iron deficiency anemia secondary to blood loss (chronic): Secondary | ICD-10-CM

## 2015-06-03 DIAGNOSIS — S025XXA Fracture of tooth (traumatic), initial encounter for closed fracture: Secondary | ICD-10-CM | POA: Diagnosis not present

## 2015-06-03 DIAGNOSIS — C787 Secondary malignant neoplasm of liver and intrahepatic bile duct: Secondary | ICD-10-CM

## 2015-06-03 DIAGNOSIS — I150 Renovascular hypertension: Secondary | ICD-10-CM

## 2015-06-03 LAB — CMP (CANCER CENTER ONLY)
ALBUMIN: 2.8 g/dL — AB (ref 3.3–5.5)
ALT(SGPT): 18 U/L (ref 10–47)
AST: 22 U/L (ref 11–38)
Alkaline Phosphatase: 52 U/L (ref 26–84)
BUN, Bld: 19 mg/dL (ref 7–22)
CALCIUM: 5.8 mg/dL — AB (ref 8.0–10.3)
CHLORIDE: 105 meq/L (ref 98–108)
CO2: 26 meq/L (ref 18–33)
CREATININE: 1.4 mg/dL — AB (ref 0.6–1.2)
Glucose, Bld: 75 mg/dL (ref 73–118)
Potassium: 4.6 mEq/L (ref 3.3–4.7)
SODIUM: 142 meq/L (ref 128–145)
Total Bilirubin: 0.5 mg/dl (ref 0.20–1.60)
Total Protein: 5.8 g/dL — ABNORMAL LOW (ref 6.4–8.1)

## 2015-06-03 LAB — CBC WITH DIFFERENTIAL (CANCER CENTER ONLY)
BASO#: 0.1 10*3/uL (ref 0.0–0.2)
BASO%: 0.7 % (ref 0.0–2.0)
EOS ABS: 0.2 10*3/uL (ref 0.0–0.5)
EOS%: 2.2 % (ref 0.0–7.0)
HCT: 33.1 % — ABNORMAL LOW (ref 34.8–46.6)
HEMOGLOBIN: 10.6 g/dL — AB (ref 11.6–15.9)
LYMPH#: 1.7 10*3/uL (ref 0.9–3.3)
LYMPH%: 25.9 % (ref 14.0–48.0)
MCH: 31 pg (ref 26.0–34.0)
MCHC: 32 g/dL (ref 32.0–36.0)
MCV: 97 fL (ref 81–101)
MONO#: 0.5 10*3/uL (ref 0.1–0.9)
MONO%: 6.8 % (ref 0.0–13.0)
NEUT%: 64.4 % (ref 39.6–80.0)
NEUTROS ABS: 4.3 10*3/uL (ref 1.5–6.5)
Platelets: 202 10*3/uL (ref 145–400)
RBC: 3.42 10*6/uL — ABNORMAL LOW (ref 3.70–5.32)
RDW: 17.1 % — ABNORMAL HIGH (ref 11.1–15.7)
WBC: 6.7 10*3/uL (ref 3.9–10.0)

## 2015-06-03 LAB — IRON AND TIBC
%SAT: 74 % — ABNORMAL HIGH (ref 21–57)
IRON: 107 ug/dL (ref 41–142)
TIBC: 145 ug/dL — AB (ref 236–444)
UIBC: 38 ug/dL — AB (ref 120–384)

## 2015-06-03 LAB — FERRITIN: Ferritin: 727 ng/ml — ABNORMAL HIGH (ref 9–269)

## 2015-06-03 LAB — TECHNOLOGIST REVIEW CHCC SATELLITE

## 2015-06-03 MED ORDER — CYANOCOBALAMIN 1000 MCG/ML IJ SOLN
1000.0000 ug | Freq: Once | INTRAMUSCULAR | Status: AC
  Administered 2015-06-03: 1000 ug via INTRAMUSCULAR

## 2015-06-03 MED ORDER — PACLITAXEL PROTEIN-BOUND CHEMO INJECTION 100 MG
100.0000 mg/m2 | Freq: Once | INTRAVENOUS | Status: AC
  Administered 2015-06-03: 225 mg via INTRAVENOUS
  Filled 2015-06-03: qty 45

## 2015-06-03 MED ORDER — HYDROMORPHONE HCL 4 MG/ML IJ SOLN
4.0000 mg | INTRAMUSCULAR | Status: DC | PRN
Start: 2015-06-03 — End: 2015-06-03
  Administered 2015-06-03: 4 mg via INTRAVENOUS

## 2015-06-03 MED ORDER — PALONOSETRON HCL INJECTION 0.25 MG/5ML
INTRAVENOUS | Status: AC
  Filled 2015-06-03: qty 5

## 2015-06-03 MED ORDER — PALONOSETRON HCL INJECTION 0.25 MG/5ML
0.2500 mg | Freq: Once | INTRAVENOUS | Status: AC
  Administered 2015-06-03: 0.25 mg via INTRAVENOUS

## 2015-06-03 MED ORDER — CARVEDILOL 6.25 MG PO TABS: 6.2500 mg | ORAL_TABLET | Freq: Two times a day (BID) | ORAL | Status: DC

## 2015-06-03 MED ORDER — HYDROMORPHONE HCL 4 MG/ML IJ SOLN
INTRAMUSCULAR | Status: AC
  Filled 2015-06-03: qty 1

## 2015-06-03 MED ORDER — SODIUM CHLORIDE 0.9 % IV SOLN
2.0000 g | Freq: Once | INTRAVENOUS | Status: AC
  Administered 2015-06-03: 2 g via INTRAVENOUS
  Filled 2015-06-03: qty 20

## 2015-06-03 MED ORDER — HEPARIN SOD (PORK) LOCK FLUSH 100 UNIT/ML IV SOLN
500.0000 [IU] | Freq: Once | INTRAVENOUS | Status: AC | PRN
  Administered 2015-06-03: 500 [IU]
  Filled 2015-06-03: qty 5

## 2015-06-03 MED ORDER — FOSAPREPITANT DIMEGLUMINE INJECTION 150 MG
Freq: Once | INTRAVENOUS | Status: AC
  Administered 2015-06-03: 13:00:00 via INTRAVENOUS
  Filled 2015-06-03: qty 5

## 2015-06-03 MED ORDER — SODIUM CHLORIDE 0.9 % IV SOLN
Freq: Once | INTRAVENOUS | Status: AC
  Administered 2015-06-03: 11:00:00 via INTRAVENOUS

## 2015-06-03 MED ORDER — CYANOCOBALAMIN 1000 MCG/ML IJ SOLN
INTRAMUSCULAR | Status: AC
  Filled 2015-06-03: qty 1

## 2015-06-03 MED ORDER — SODIUM CHLORIDE 0.9 % IJ SOLN
10.0000 mL | INTRAMUSCULAR | Status: DC | PRN
  Administered 2015-06-03: 10 mL
  Filled 2015-06-03: qty 10

## 2015-06-03 MED ORDER — FENTANYL 50 MCG/HR TD PT72: 100.0000 ug | MEDICATED_PATCH | TRANSDERMAL | Status: DC

## 2015-06-03 NOTE — Patient Instructions (Signed)

## 2015-06-03 NOTE — Progress Notes (Signed)
Hematology and Oncology Follow Up Visit  Shanaia Drozda [id number] [birth year] 60 y.o. 06/03/2015   Principle Diagnosis:  1. Metastatic breast cancer, progressive liver metastases  2. Gastric bypass for subsequent B12 and iron deficiency 3. Osteoarthritis with chronic pain 4. 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 Abraxane q 21 days - s/p cycle #1 2. Vitamin B12 1 mg IM monthly 3. IV iron as indicated    Interim History:  [name] is here today with her daughter.she seemed to tolerate chemotherapy pretty well so far. Is hard to say how well it's working.  Chronic pain seems to be her biggest problem area and she has severe arthritis in her back. There is no metastatic disease to her spine. She does has incredible stenosis and spurs.  I think changing her Duragesic patch to every 2 days would be reasonable.  Her appetite is doing okay. She's had no nausea or vomiting.  She's had no bleeding. Has not been any leg swelling. She's had no rashes.  She's had no cough. She's had no fever.  Overall, her performance status is ECOG 2. Medications:    Medication List       This list is accurate as of: 06/03/15  6:21 PM.  Always use your most recent med list.               carvedilol 6.25 MG tablet  Commonly known as:  COREG  Take 1 tablet (6.25 mg total) by mouth 2 (two) times daily with a meal.     cyanocobalamin 1000 MCG/ML injection  Commonly known as:  (VITAMIN B-12)  INJECT 1 ML AS DIRECTED EVERY 21 DAYS     dexamethasone 4 MG tablet  Commonly known as:  DECADRON  Take 2 tablets (8 mg total) by mouth 2 (two) times daily with a meal. Start the day after chemotherapy for 2 days. Take with food.     fentaNYL 50 MCG/HR  Commonly known as:  DURAGESIC - dosed mcg/hr  Place 2 patches (100 mcg total) onto the skin every other day.     FLUoxetine 40 MG capsule  Commonly known as:  PROZAC  Take 40 mg by mouth daily before breakfast.     furosemide 20 MG tablet  Commonly known as:  LASIX  Take 60 mg by mouth daily.     levothyroxine 150 MCG tablet  Commonly known as:  SYNTHROID, LEVOTHROID  Take 1 tablet (150 mcg total) by mouth daily.     lidocaine-prilocaine cream  Commonly known as:  EMLA  Apply to affected area once     LORazepam 0.5 MG tablet  Commonly known as:  ATIVAN  PLACE 1 TABLET UNDER TONGUE EVERY 6 HOURS AS NEEDED FOR NAUSEA AND VOMITING     LYRICA 200 MG capsule  Generic drug:  pregabalin  TAKE 1 CAPSULE 2 TIMES A DAY     methocarbamol 750 MG tablet  Commonly known as:  ROBAXIN  Take 750 mg by mouth 4 (four) times daily.     MULTI-VITAMINS Tabs  Take 1 tablet by mouth daily.     nitroGLYCERIN 0.4 MG SL tablet  Commonly known as:  NITROSTAT  Place 1 tablet (0.4 mg total) under the tongue every 5 (five) minutes as needed for chest pain.     ondansetron 8 MG tablet  Commonly known as:  ZOFRAN  Take 1 tablet (8 mg total) by mouth every 8 (eight) hours as needed for  nausea or vomiting.     ondansetron 8 MG tablet  Commonly known as:  ZOFRAN  Take 1 tablet (8 mg total) by mouth 2 (two) times daily. Start the day after chemo for 2 days. Then take as needed for nausea or vomiting.     oxycodone 5 MG capsule  Commonly known as:  OXY-IR  Take 2-4 capsules (10-20 mg total) by mouth every 4 (four) hours.     pantoprazole 40 MG tablet  Commonly known as:  PROTONIX  Take 1 tablet (40 mg total) by mouth daily at 12 noon.     polyethylene glycol packet  Commonly known as:  MIRALAX / GLYCOLAX  Take 17 g by mouth 2 (two) times daily.     potassium chloride SA 20 MEQ tablet  Commonly known as:  KLOR-CON M20  Take 2 tablets (40 mEq total) by mouth 2 (two) times daily.     prochlorperazine 10 MG tablet  Commonly known as:  COMPAZINE  TAKE 1 TABLET BY MOUTH EVERY 6 HOURS AS NEEDED FOR NAUSEA OR VOMITING     promethazine 25 MG tablet  Commonly known as:  PHENERGAN  TAKE 1 TABLET BY MOUTH THREE  TIMES DAILY AS NEEDED FOR NAUSEA     temazepam 15 MG capsule  Commonly known as:  RESTORIL  Take 30 mg by mouth at bedtime.     tiZANidine 4 MG tablet  Commonly known as:  ZANAFLEX  Take 4 mg by mouth every 6 (six) hours as needed for muscle spasms.        Allergies:  Allergies  Allergen Reactions  . Influenza Vac Split [Flu Virus Vaccine] Other (See Comments)    Joint stiffness and renal failure  . Ritalin [Methylphenidate Hcl]     "Heart attack"  . Zostavax [Zoster Vaccine Live ([employer])] Other (See Comments)    Joint stiffness, renal failure  . Adhesive [Tape] Itching and Rash    Past Medical History, Surgical history, Social history, and Family History were reviewed and updated.  Review of Systems: All other 10 point review of systems is negative.   Physical Exam:  weight is 231 lb 9.6 oz (105.053 kg). Her oral temperature is 98 F (36.7 C). Her blood pressure is 107/75 and her pulse is 71. Her respiration is 14.   Wt Readings from Last 3 Encounters:  06/03/15 231 lb 9.6 oz (105.053 kg)  05/13/15 230 lb (104.327 kg)  05/06/15 240 lb (108.863 kg)    Ocular: Sclerae unicteric, pupils equal, round and reactive to light Ear-nose-throat: Oropharynx clear, dentition fair Lymphatic: No cervical supraclavicular or axillary adenopathy Lungs no rales or rhonchi, good excursion bilaterally Heart regular rate and rhythm, no murmur appreciated Abd soft, tender in right abdomen, positive bowel sounds, no liver or spleen tip palpated on exam MSK no focal spinal tenderness, no joint edema Neuro: non-focal, well-oriented, appropriate affect Breasts: Surgical changes due to bilateral mastectomies. No mass, lesion, rash or lymphadenopathy.  Lab Results  Component Value Date   WBC 6.7 06/03/2015   HGB 10.6* 06/03/2015   HCT 33.1* 06/03/2015   MCV 97 06/03/2015   PLT 202 06/03/2015   Lab Results  Component Value Date   FERRITIN 727* 06/03/2015   IRON 107 06/03/2015    TIBC 145* 06/03/2015   UIBC 38* 06/03/2015   IRONPCTSAT 74* 06/03/2015   Lab Results  Component Value Date   RETICCTPCT 1.0 06/29/2014   RBC 3.42* 06/03/2015   RETICCTABS 32.3 06/29/2014   No results  found for: KPAFRELGTCHN, LAMBDASER, KAPLAMBRATIO No results found for: Osborne Casco Lab Results  Component Value Date   TOTALPROTELP 4.7* 01/31/2010   ALBUMINELP 39.4* 01/31/2010   A1GS 12.1* 01/31/2010   A2GS 20.3* 01/31/2010   BETS 6.0 01/31/2010   BETA2SER 6.8* 01/31/2010   GAMS 15.4 01/31/2010   MSPIKE NOT DETECTED 01/31/2010   SPEI  01/31/2010    (NOTE) The possibility of a faint restricted band(s) cannot be completely excluded in the gamma region.  Suggest serum IFE to evaluate possibility, if clinically indicated. (Lab will hold sample one week. Please call Customer Service at [phone] to  add test.) Reviewed by Odis Hollingshead, MD, PhD, FCAP (Electronic Signature on File)     Chemistry      Component Value Date/Time   NA 142 06/03/2015 0943   NA 143 04/21/2015 0530   NA 141 03/18/2015 0918   K 4.6 06/03/2015 0943   K 3.3* 04/21/2015 0530   K 3.8 03/18/2015 0918   CL 105 06/03/2015 0943   CL 111 04/21/2015 0530   CO2 26 06/03/2015 0943   CO2 24 04/21/2015 0530   CO2 26 03/18/2015 0918   BUN 19 06/03/2015 0943   BUN 10 04/21/2015 0530   BUN 12.5 03/18/2015 0918   CREATININE 1.4* 06/03/2015 0943   CREATININE 1.15* 04/21/2015 0530   CREATININE 1.2* 03/18/2015 0918      Component Value Date/Time   CALCIUM 5.8* 06/03/2015 0943   CALCIUM 7.9* 04/21/2015 0530   CALCIUM 9.2 03/18/2015 0918   ALKPHOS 52 06/03/2015 0943   ALKPHOS 60 04/21/2015 0530   ALKPHOS 74 03/18/2015 0918   AST 22 06/03/2015 0943   AST 17 04/21/2015 0530   AST 21 03/18/2015 0918   ALT 18 06/03/2015 0943   ALT 12* 04/21/2015 0530   ALT 11 03/18/2015 0918   BILITOT 0.50 06/03/2015 0943   BILITOT 0.8 04/21/2015 0530   BILITOT 0.57 03/18/2015 0918     Impression and Plan:  [name] is 60 year-old female with metastatic breast cancer with mets to the liver. She is now being treated with Abraxane.   Again, it is a little bit too soon to know how well things are working. Her last CA 27.29 was holding steady area did  We will go ahead and start cycle #2.  I don't think we had to do any scans on her for another couple months.  Her main detriment to her quality of life is the lower back. I think we have to be aggressive with trying to treat this. She has metastatic breast cancer which is not curable so I really don't see a problem with having her on potent narcotics given that she has and incurable disease. She certainly is okay with this. Her daughter is okay with this.  Her iron studies are looking pretty good right now.  We will plan to get her back to see [place] for the start of her third cycle of treatment in 21 days.  I spent about 35 minutes with she and her daughter today.   Volanda Napoleon, MD 2/2/20176:21 PM

## 2015-06-04 LAB — CANCER ANTIGEN 27.29: CA 27.29: 63.2 U/mL — ABNORMAL HIGH (ref 0.0–38.6)

## 2015-06-04 LAB — VITAMIN B12: VITAMIN B 12: 558 pg/mL (ref 211–946)

## 2015-06-04 LAB — CANCER ANTIGEN 27-29 (PARALLEL TESTING): CA 27.29: 49 U/mL — ABNORMAL HIGH (ref 0–39)

## 2015-06-10 ENCOUNTER — Other Ambulatory Visit: Payer: Self-pay | Admitting: Hematology & Oncology

## 2015-06-10 ENCOUNTER — Other Ambulatory Visit: Payer: Self-pay | Admitting: Family

## 2015-06-10 DIAGNOSIS — G894 Chronic pain syndrome: Secondary | ICD-10-CM | POA: Diagnosis not present

## 2015-06-10 DIAGNOSIS — Z79891 Long term (current) use of opiate analgesic: Secondary | ICD-10-CM | POA: Diagnosis not present

## 2015-06-10 DIAGNOSIS — G603 Idiopathic progressive neuropathy: Secondary | ICD-10-CM | POA: Diagnosis not present

## 2015-06-10 DIAGNOSIS — M4726 Other spondylosis with radiculopathy, lumbar region: Secondary | ICD-10-CM | POA: Diagnosis not present

## 2015-06-17 ENCOUNTER — Encounter: Payer: Self-pay | Admitting: Family

## 2015-06-17 ENCOUNTER — Ambulatory Visit (HOSPITAL_BASED_OUTPATIENT_CLINIC_OR_DEPARTMENT_OTHER): Payer: Medicare Other

## 2015-06-17 ENCOUNTER — Other Ambulatory Visit (HOSPITAL_BASED_OUTPATIENT_CLINIC_OR_DEPARTMENT_OTHER): Payer: Medicare Other

## 2015-06-17 ENCOUNTER — Ambulatory Visit (HOSPITAL_BASED_OUTPATIENT_CLINIC_OR_DEPARTMENT_OTHER): Payer: Medicare Other | Admitting: Family

## 2015-06-17 VITALS — BP 120/68 | HR 64 | Temp 98.2°F | Resp 16 | Ht 66.0 in | Wt 244.0 lb

## 2015-06-17 DIAGNOSIS — I150 Renovascular hypertension: Secondary | ICD-10-CM

## 2015-06-17 DIAGNOSIS — C50911 Malignant neoplasm of unspecified site of right female breast: Secondary | ICD-10-CM | POA: Diagnosis not present

## 2015-06-17 DIAGNOSIS — C787 Secondary malignant neoplasm of liver and intrahepatic bile duct: Secondary | ICD-10-CM

## 2015-06-17 DIAGNOSIS — R110 Nausea: Secondary | ICD-10-CM | POA: Diagnosis not present

## 2015-06-17 DIAGNOSIS — D500 Iron deficiency anemia secondary to blood loss (chronic): Secondary | ICD-10-CM

## 2015-06-17 DIAGNOSIS — G8929 Other chronic pain: Secondary | ICD-10-CM | POA: Diagnosis not present

## 2015-06-17 DIAGNOSIS — S025XXA Fracture of tooth (traumatic), initial encounter for closed fracture: Secondary | ICD-10-CM | POA: Diagnosis not present

## 2015-06-17 DIAGNOSIS — D510 Vitamin B12 deficiency anemia due to intrinsic factor deficiency: Secondary | ICD-10-CM | POA: Diagnosis not present

## 2015-06-17 DIAGNOSIS — G894 Chronic pain syndrome: Secondary | ICD-10-CM | POA: Diagnosis not present

## 2015-06-17 DIAGNOSIS — M1990 Unspecified osteoarthritis, unspecified site: Secondary | ICD-10-CM

## 2015-06-17 DIAGNOSIS — C50912 Malignant neoplasm of unspecified site of left female breast: Secondary | ICD-10-CM

## 2015-06-17 DIAGNOSIS — C50919 Malignant neoplasm of unspecified site of unspecified female breast: Secondary | ICD-10-CM | POA: Diagnosis not present

## 2015-06-17 DIAGNOSIS — Z5111 Encounter for antineoplastic chemotherapy: Secondary | ICD-10-CM

## 2015-06-17 LAB — CMP (CANCER CENTER ONLY)
ALBUMIN: 3.2 g/dL — AB (ref 3.3–5.5)
ALT(SGPT): 29 U/L (ref 10–47)
AST: 24 U/L (ref 11–38)
Alkaline Phosphatase: 45 U/L (ref 26–84)
BILIRUBIN TOTAL: 0.4 mg/dL (ref 0.20–1.60)
BUN, Bld: 23 mg/dL — ABNORMAL HIGH (ref 7–22)
CALCIUM: 7.3 mg/dL — AB (ref 8.0–10.3)
CHLORIDE: 105 meq/L (ref 98–108)
CO2: 27 meq/L (ref 18–33)
CREATININE: 1.5 mg/dL — AB (ref 0.6–1.2)
Glucose, Bld: 105 mg/dL (ref 73–118)
Potassium: 5.5 mEq/L — ABNORMAL HIGH (ref 3.3–4.7)
SODIUM: 135 meq/L (ref 128–145)
TOTAL PROTEIN: 5.8 g/dL — AB (ref 6.4–8.1)

## 2015-06-17 LAB — CBC WITH DIFFERENTIAL (CANCER CENTER ONLY)
BASO#: 0 10*3/uL (ref 0.0–0.2)
BASO%: 0.1 % (ref 0.0–2.0)
EOS%: 0 % (ref 0.0–7.0)
Eosinophils Absolute: 0 10*3/uL (ref 0.0–0.5)
HCT: 33.1 % — ABNORMAL LOW (ref 34.8–46.6)
HGB: 10.5 g/dL — ABNORMAL LOW (ref 11.6–15.9)
LYMPH#: 0.7 10*3/uL — ABNORMAL LOW (ref 0.9–3.3)
LYMPH%: 8.4 % — AB (ref 14.0–48.0)
MCH: 31.3 pg (ref 26.0–34.0)
MCHC: 31.7 g/dL — AB (ref 32.0–36.0)
MCV: 99 fL (ref 81–101)
MONO#: 0.2 10*3/uL (ref 0.1–0.9)
MONO%: 2.4 % (ref 0.0–13.0)
NEUT#: 7.1 10*3/uL — ABNORMAL HIGH (ref 1.5–6.5)
NEUT%: 89.1 % — AB (ref 39.6–80.0)
PLATELETS: 193 10*3/uL (ref 145–400)
RBC: 3.35 10*6/uL — ABNORMAL LOW (ref 3.70–5.32)
RDW: 17.3 % — AB (ref 11.1–15.7)
WBC: 8 10*3/uL (ref 3.9–10.0)

## 2015-06-17 LAB — FERRITIN: FERRITIN: 737 ng/mL — AB (ref 9–269)

## 2015-06-17 LAB — IRON AND TIBC
%SAT: 61 % — AB (ref 21–57)
IRON: 112 ug/dL (ref 41–142)
TIBC: 183 ug/dL — ABNORMAL LOW (ref 236–444)
UIBC: 71 ug/dL — AB (ref 120–384)

## 2015-06-17 LAB — RETICULOCYTES: RETICULOCYTE COUNT: 2.8 % — AB (ref 0.6–2.6)

## 2015-06-17 MED ORDER — HEPARIN SOD (PORK) LOCK FLUSH 100 UNIT/ML IV SOLN
500.0000 [IU] | Freq: Once | INTRAVENOUS | Status: AC | PRN
  Administered 2015-06-17: 500 [IU]
  Filled 2015-06-17: qty 5

## 2015-06-17 MED ORDER — PALONOSETRON HCL INJECTION 0.25 MG/5ML
0.2500 mg | Freq: Once | INTRAVENOUS | Status: AC
  Administered 2015-06-17: 0.25 mg via INTRAVENOUS

## 2015-06-17 MED ORDER — HYDROMORPHONE HCL 4 MG/ML IJ SOLN
INTRAMUSCULAR | Status: AC
  Filled 2015-06-17: qty 1

## 2015-06-17 MED ORDER — FOSAPREPITANT DIMEGLUMINE INJECTION 150 MG
Freq: Once | INTRAVENOUS | Status: AC
  Administered 2015-06-17: 12:00:00 via INTRAVENOUS
  Filled 2015-06-17: qty 5

## 2015-06-17 MED ORDER — PACLITAXEL PROTEIN-BOUND CHEMO INJECTION 100 MG
100.0000 mg/m2 | Freq: Once | INTRAVENOUS | Status: AC
  Administered 2015-06-17: 225 mg via INTRAVENOUS
  Filled 2015-06-17: qty 45

## 2015-06-17 MED ORDER — HYDROMORPHONE HCL 4 MG/ML IJ SOLN
4.0000 mg | Freq: Once | INTRAMUSCULAR | Status: AC
  Administered 2015-06-17: 4 mg via INTRAVENOUS

## 2015-06-17 MED ORDER — SODIUM CHLORIDE 0.9 % IJ SOLN
10.0000 mL | INTRAMUSCULAR | Status: DC | PRN
  Administered 2015-06-17: 10 mL
  Filled 2015-06-17: qty 10

## 2015-06-17 MED ORDER — PALONOSETRON HCL INJECTION 0.25 MG/5ML
INTRAVENOUS | Status: AC
  Filled 2015-06-17: qty 5

## 2015-06-17 MED ORDER — SODIUM CHLORIDE 0.9 % IV SOLN
Freq: Once | INTRAVENOUS | Status: AC
  Administered 2015-06-17: 11:00:00 via INTRAVENOUS

## 2015-06-17 NOTE — Patient Instructions (Signed)
[facility] Discharge Instructions for Patients Receiving Chemotherapy  Today you received the following chemotherapy agents Abraxane  To help prevent nausea and vomiting after your treatment, we encourage you to take your nausea medication    If you develop nausea and vomiting that is not controlled by your nausea medication, call the clinic.   BELOW ARE SYMPTOMS THAT SHOULD BE REPORTED IMMEDIATELY:  *FEVER GREATER THAN 100.5 F  *CHILLS WITH OR WITHOUT FEVER  NAUSEA AND VOMITING THAT IS NOT CONTROLLED WITH YOUR NAUSEA MEDICATION  *UNUSUAL SHORTNESS OF BREATH  *UNUSUAL BRUISING OR BLEEDING  TENDERNESS IN MOUTH AND THROAT WITH OR WITHOUT PRESENCE OF ULCERS  *URINARY PROBLEMS  *BOWEL PROBLEMS  UNUSUAL RASH Items with * indicate a potential emergency and should be followed up as soon as possible.  Feel free to call the clinic you have any questions or concerns. The clinic phone number is [phone].  Please show the CHEMO ALERT CARD at check-in to the Emergency Department and triage nurse.   

## 2015-06-17 NOTE — Progress Notes (Signed)
Hematology and Oncology Follow Up Visit  Kerri Vasquez [id number] 04-25-1956 60 y.o. 06/17/2015   Principle Diagnosis:  1. Metastatic breast cancer, progressive liver metastases  2. Gastric bypass for subsequent B12 and iron deficiency 3. Osteoarthritis with chronic pain 4. 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 Abraxane q 21 days  2. Vitamin B12 1 mg IM monthly 3. IV iron as indicated    Interim History:  [name] is here today with her daughter for a follow-up and cycle 3 of Abraxane. She did well with the aloxi, emend and dex combination with her last treatment. Her nausea was significantly reduced and she had no episodes of vomiting. Her appetite has improved and her weight is up 13 lbs today. She is staying well hydrated.  No fever, chills, cough, rash, dizziness, chest pain, palpitations or changes in bladder habits. She still has mild SOB with exertion at times. This passes with taking a moment to rest.  Chest exam was negative. No lymphadenopathy found on exam.  She has had some mild numbness and tingling in her feet. This has not effected her gait. She has had no falls or syncopal episodes. She has some chronic swelling in the right lower extremity that comes and goes and is currently on lasix 60 mg daily. Her pedal pulses are +1. No new aches or pain. She states that her pain has improved and she is able to get around much better.   Medications:    Medication List       This list is accurate as of: 06/17/15 10:17 AM.  Always use your most recent med list.               carvedilol 6.25 MG tablet  Commonly known as:  COREG  Take 1 tablet (6.25 mg total) by mouth 2 (two) times daily with a meal.     cyanocobalamin 1000 MCG/ML injection  Commonly known as:  (VITAMIN B-12)  INJECT 1 ML AS DIRECTED EVERY 21 DAYS     dexamethasone 4 MG tablet  Commonly known as:  DECADRON  TAKE 2 TABLETS BY MOUTH TWICE DAILY WITH A MEAL. START THE  DAY AFTER CHEMOTHERAPY FOR 2 DAYS.     fentaNYL 50 MCG/HR  Commonly known as:  DURAGESIC - dosed mcg/hr  Place 2 patches (100 mcg total) onto the skin every other day.     FLUoxetine 40 MG capsule  Commonly known as:  PROZAC  Take 40 mg by mouth daily before breakfast.     furosemide 20 MG tablet  Commonly known as:  LASIX  Take 60 mg by mouth daily.     levothyroxine 150 MCG tablet  Commonly known as:  SYNTHROID, LEVOTHROID  Take 1 tablet (150 mcg total) by mouth daily.     lidocaine-prilocaine cream  Commonly known as:  EMLA  Apply to affected area once     LORazepam 0.5 MG tablet  Commonly known as:  ATIVAN  PLACE 1 TABLET UNDER TONGUE EVERY 6 HOURS AS NEEDED FOR NAUSEA AND VOMITING     LYRICA 200 MG capsule  Generic drug:  pregabalin  TAKE 1 CAPSULE 2 TIMES A DAY     methocarbamol 750 MG tablet  Commonly known as:  ROBAXIN  Take 750 mg by mouth 4 (four) times daily.     MULTI-VITAMINS Tabs  Take 1 tablet by mouth daily.     nitroGLYCERIN 0.4 MG SL tablet  Commonly known as:  NITROSTAT  Place 1 tablet (0.4 mg total) under the tongue every 5 (five) minutes as needed for chest pain.     ondansetron 8 MG tablet  Commonly known as:  ZOFRAN  Take 1 tablet (8 mg total) by mouth every 8 (eight) hours as needed for nausea or vomiting.     ondansetron 8 MG tablet  Commonly known as:  ZOFRAN  Take 1 tablet (8 mg total) by mouth 2 (two) times daily. Start the day after chemo for 2 days. Then take as needed for nausea or vomiting.     oxycodone 5 MG capsule  Commonly known as:  OXY-IR  Take 2-4 capsules (10-20 mg total) by mouth every 4 (four) hours.     pantoprazole 40 MG tablet  Commonly known as:  PROTONIX  Take 1 tablet (40 mg total) by mouth daily at 12 noon.     polyethylene glycol packet  Commonly known as:  MIRALAX / GLYCOLAX  Take 17 g by mouth 2 (two) times daily.     potassium chloride SA 20 MEQ tablet  Commonly known as:  KLOR-CON M20  Take 2 tablets  (40 mEq total) by mouth 2 (two) times daily.     prochlorperazine 10 MG tablet  Commonly known as:  COMPAZINE  TAKE 1 TABLET BY MOUTH EVERY 6 HOURS AS NEEDED FOR NAUSEA AND VOMITING     promethazine 25 MG tablet  Commonly known as:  PHENERGAN  TAKE 1 TABLET BY MOUTH THREE TIMES DAILY AS NEEDED FOR NAUSEA.     temazepam 15 MG capsule  Commonly known as:  RESTORIL  Take 30 mg by mouth at bedtime.     tiZANidine 4 MG tablet  Commonly known as:  ZANAFLEX  Take 4 mg by mouth every 6 (six) hours as needed for muscle spasms.        Allergies:  Allergies  Allergen Reactions  . Influenza Vac Split [Flu Virus Vaccine] Other (See Comments)    Joint stiffness and renal failure  . Ritalin [Methylphenidate Hcl]     "Heart attack"  . Zostavax [Zoster Vaccine Live ([employer])] Other (See Comments)    Joint stiffness, renal failure  . Adhesive [Tape] Itching and Rash    Past Medical History, Surgical history, Social history, and Family History were reviewed and updated.  Review of Systems: All other 10 point review of systems is negative.   Physical Exam:  vitals were not taken for this visit.  Wt Readings from Last 3 Encounters:  06/03/15 231 lb 9.6 oz (105.053 kg)  05/13/15 230 lb (104.327 kg)  05/06/15 240 lb (108.863 kg)    Ocular: Sclerae unicteric, pupils equal, round and reactive to light Ear-nose-throat: Oropharynx clear, dentition fair Lymphatic: No cervical supraclavicular or axillary adenopathy Lungs no rales or rhonchi, good excursion bilaterally Heart regular rate and rhythm, no murmur appreciated Abd soft, tender in right abdomen, positive bowel sounds, no liver or spleen tip palpated on exam MSK no focal spinal tenderness, no joint edema Neuro: non-focal, well-oriented, appropriate affect Breasts: Surgical changes due to bilateral mastectomies. No mass, lesion, rash or lymphadenopathy.  Lab Results  Component Value Date   WBC 6.7 06/03/2015   HGB 10.6*  06/03/2015   HCT 33.1* 06/03/2015   MCV 97 06/03/2015   PLT 202 06/03/2015   Lab Results  Component Value Date   FERRITIN 727* 06/03/2015   IRON 107 06/03/2015   TIBC 145* 06/03/2015   UIBC 38* 06/03/2015   IRONPCTSAT 74* 06/03/2015  Lab Results  Component Value Date   RETICCTPCT 1.0 06/29/2014   RBC 3.42* 06/03/2015   RETICCTABS 32.3 06/29/2014   No results found for: KPAFRELGTCHN, LAMBDASER, KAPLAMBRATIO No results found for: Osborne Casco Lab Results  Component Value Date   TOTALPROTELP 4.7* 01/31/2010   ALBUMINELP 39.4* 01/31/2010   A1GS 12.1* 01/31/2010   A2GS 20.3* 01/31/2010   BETS 6.0 01/31/2010   BETA2SER 6.8* 01/31/2010   GAMS 15.4 01/31/2010   MSPIKE NOT DETECTED 01/31/2010   SPEI  01/31/2010    (NOTE) The possibility of a faint restricted band(s) cannot be completely excluded in the gamma region.  Suggest serum IFE to evaluate possibility, if clinically indicated. (Lab will hold sample one week. Please call Customer Service at [phone] to  add test.) Reviewed by Odis Hollingshead, MD, PhD, FCAP (Electronic Signature on File)     Chemistry      Component Value Date/Time   NA 142 06/03/2015 0943   NA 143 04/21/2015 0530   NA 141 03/18/2015 0918   K 4.6 06/03/2015 0943   K 3.3* 04/21/2015 0530   K 3.8 03/18/2015 0918   CL 105 06/03/2015 0943   CL 111 04/21/2015 0530   CO2 26 06/03/2015 0943   CO2 24 04/21/2015 0530   CO2 26 03/18/2015 0918   BUN 19 06/03/2015 0943   BUN 10 04/21/2015 0530   BUN 12.5 03/18/2015 0918   CREATININE 1.4* 06/03/2015 0943   CREATININE 1.15* 04/21/2015 0530   CREATININE 1.2* 03/18/2015 0918      Component Value Date/Time   CALCIUM 5.8* 06/03/2015 0943   CALCIUM 7.9* 04/21/2015 0530   CALCIUM 9.2 03/18/2015 0918   ALKPHOS 52 06/03/2015 0943   ALKPHOS 60 04/21/2015 0530   ALKPHOS 74 03/18/2015 0918   AST 22 06/03/2015 0943   AST 17 04/21/2015 0530   AST 21 03/18/2015 0918   ALT 18 06/03/2015 0943    ALT 12* 04/21/2015 0530   ALT 11 03/18/2015 0918   BILITOT 0.50 06/03/2015 0943   BILITOT 0.8 04/21/2015 0530   BILITOT 0.57 03/18/2015 0918     Impression and Plan: [name] is 60 year old female with metastatic breast cancer with mets to the liver. She did well with her last cycle of treatment after getting the Aloxi, Emend and Dex with it. She has had very little nausea and has been able to eat. She has gained 13 lbs since her last visit and her pain has improved with the change in her Duragesic patch.  We will proceed with cycle 3 of Abraxane today as planned and she will received her antiemetics with treatment again.  She also received Dilaudid IV for lower back pain from spinal stenosis. She states that she is feeling much better. She has her current treatment/appointment schedule.   Both she and her daughter know to contact us with any questions or concerns. We can certainly see her sooner if need be.   Eliezer Bottom, NP 2/16/201710:17 AM

## 2015-06-18 LAB — CANCER ANTIGEN 27-29 (PARALLEL TESTING): CA 27.29: 62 U/mL — ABNORMAL HIGH (ref <38)

## 2015-06-18 LAB — CANCER ANTIGEN 27.29: CA 27.29: 58.5 U/mL — ABNORMAL HIGH (ref 0.0–38.6)

## 2015-06-21 ENCOUNTER — Encounter: Payer: Self-pay | Admitting: Family

## 2015-06-21 ENCOUNTER — Other Ambulatory Visit: Payer: Self-pay | Admitting: Hematology & Oncology

## 2015-06-21 ENCOUNTER — Other Ambulatory Visit: Payer: Self-pay | Admitting: Family

## 2015-06-23 ENCOUNTER — Other Ambulatory Visit: Payer: Self-pay | Admitting: *Deleted

## 2015-06-23 DIAGNOSIS — C787 Secondary malignant neoplasm of liver and intrahepatic bile duct: Principal | ICD-10-CM

## 2015-06-23 DIAGNOSIS — C50912 Malignant neoplasm of unspecified site of left female breast: Secondary | ICD-10-CM

## 2015-06-24 ENCOUNTER — Ambulatory Visit (HOSPITAL_BASED_OUTPATIENT_CLINIC_OR_DEPARTMENT_OTHER): Payer: Medicare Other | Admitting: Family

## 2015-06-24 ENCOUNTER — Encounter: Payer: Self-pay | Admitting: Family

## 2015-06-24 ENCOUNTER — Ambulatory Visit (HOSPITAL_BASED_OUTPATIENT_CLINIC_OR_DEPARTMENT_OTHER): Payer: Medicare Other

## 2015-06-24 ENCOUNTER — Telehealth: Payer: Self-pay | Admitting: *Deleted

## 2015-06-24 ENCOUNTER — Other Ambulatory Visit (HOSPITAL_BASED_OUTPATIENT_CLINIC_OR_DEPARTMENT_OTHER): Payer: Medicare Other

## 2015-06-24 VITALS — BP 145/90 | HR 76 | Temp 97.9°F | Resp 16 | Ht 66.0 in | Wt 241.0 lb

## 2015-06-24 DIAGNOSIS — C787 Secondary malignant neoplasm of liver and intrahepatic bile duct: Secondary | ICD-10-CM | POA: Diagnosis not present

## 2015-06-24 DIAGNOSIS — C50912 Malignant neoplasm of unspecified site of left female breast: Secondary | ICD-10-CM

## 2015-06-24 DIAGNOSIS — Z5111 Encounter for antineoplastic chemotherapy: Secondary | ICD-10-CM

## 2015-06-24 DIAGNOSIS — G8929 Other chronic pain: Secondary | ICD-10-CM

## 2015-06-24 DIAGNOSIS — D510 Vitamin B12 deficiency anemia due to intrinsic factor deficiency: Secondary | ICD-10-CM

## 2015-06-24 DIAGNOSIS — C50911 Malignant neoplasm of unspecified site of right female breast: Secondary | ICD-10-CM

## 2015-06-24 DIAGNOSIS — M545 Low back pain, unspecified: Secondary | ICD-10-CM

## 2015-06-24 DIAGNOSIS — I150 Renovascular hypertension: Secondary | ICD-10-CM

## 2015-06-24 DIAGNOSIS — R5383 Other fatigue: Secondary | ICD-10-CM

## 2015-06-24 DIAGNOSIS — D500 Iron deficiency anemia secondary to blood loss (chronic): Secondary | ICD-10-CM

## 2015-06-24 DIAGNOSIS — C50919 Malignant neoplasm of unspecified site of unspecified female breast: Secondary | ICD-10-CM

## 2015-06-24 LAB — CBC WITH DIFFERENTIAL (CANCER CENTER ONLY)
BASO#: 0 10*3/uL (ref 0.0–0.2)
BASO%: 0.3 % (ref 0.0–2.0)
EOS%: 4.2 % (ref 0.0–7.0)
Eosinophils Absolute: 0.3 10*3/uL (ref 0.0–0.5)
HCT: 32.4 % — ABNORMAL LOW (ref 34.8–46.6)
HEMOGLOBIN: 10.4 g/dL — AB (ref 11.6–15.9)
LYMPH#: 2.1 10*3/uL (ref 0.9–3.3)
LYMPH%: 28.6 % (ref 14.0–48.0)
MCH: 32 pg (ref 26.0–34.0)
MCHC: 32.1 g/dL (ref 32.0–36.0)
MCV: 100 fL (ref 81–101)
MONO#: 0.3 10*3/uL (ref 0.1–0.9)
MONO%: 4 % (ref 0.0–13.0)
NEUT%: 62.9 % (ref 39.6–80.0)
NEUTROS ABS: 4.5 10*3/uL (ref 1.5–6.5)
Platelets: 218 10*3/uL (ref 145–400)
RBC: 3.25 10*6/uL — AB (ref 3.70–5.32)
RDW: 16.8 % — ABNORMAL HIGH (ref 11.1–15.7)
WBC: 7.2 10*3/uL (ref 3.9–10.0)

## 2015-06-24 LAB — CMP (CANCER CENTER ONLY)
ALBUMIN: 3 g/dL — AB (ref 3.3–5.5)
ALT(SGPT): 36 U/L (ref 10–47)
AST: 31 U/L (ref 11–38)
Alkaline Phosphatase: 37 U/L (ref 26–84)
BUN, Bld: 20 mg/dL (ref 7–22)
CHLORIDE: 107 meq/L (ref 98–108)
CO2: 27 meq/L (ref 18–33)
CREATININE: 1.3 mg/dL — AB (ref 0.6–1.2)
Calcium: 5.9 mg/dL — CL (ref 8.0–10.3)
Glucose, Bld: 83 mg/dL (ref 73–118)
POTASSIUM: 4 meq/L (ref 3.3–4.7)
SODIUM: 139 meq/L (ref 128–145)
Total Bilirubin: 0.5 mg/dl (ref 0.20–1.60)
Total Protein: 5.5 g/dL — ABNORMAL LOW (ref 6.4–8.1)

## 2015-06-24 MED ORDER — PALONOSETRON HCL INJECTION 0.25 MG/5ML
INTRAVENOUS | Status: AC
  Filled 2015-06-24: qty 5

## 2015-06-24 MED ORDER — DENOSUMAB 120 MG/1.7ML ~~LOC~~ SOLN: 120.0000 mg | Freq: Once | SUBCUTANEOUS | Status: DC

## 2015-06-24 MED ORDER — PACLITAXEL PROTEIN-BOUND CHEMO INJECTION 100 MG
100.0000 mg/m2 | Freq: Once | INTRAVENOUS | Status: AC
  Administered 2015-06-24: 225 mg via INTRAVENOUS
  Filled 2015-06-24: qty 45

## 2015-06-24 MED ORDER — HEPARIN SOD (PORK) LOCK FLUSH 100 UNIT/ML IV SOLN
500.0000 [IU] | Freq: Once | INTRAVENOUS | Status: AC
  Administered 2015-06-24: 500 [IU] via INTRAVENOUS
  Filled 2015-06-24: qty 5

## 2015-06-24 MED ORDER — HYDROMORPHONE HCL 4 MG/ML IJ SOLN
4.0000 mg | Freq: Once | INTRAMUSCULAR | Status: AC
  Administered 2015-06-24: 4 mg via INTRAVENOUS

## 2015-06-24 MED ORDER — SODIUM CHLORIDE 0.9 % IV SOLN
Freq: Once | INTRAVENOUS | Status: AC
  Administered 2015-06-24: 10:00:00 via INTRAVENOUS
  Filled 2015-06-24: qty 5

## 2015-06-24 MED ORDER — PALONOSETRON HCL INJECTION 0.25 MG/5ML
0.2500 mg | Freq: Once | INTRAVENOUS | Status: AC
  Administered 2015-06-24: 0.25 mg via INTRAVENOUS

## 2015-06-24 MED ORDER — SODIUM CHLORIDE 0.9% FLUSH
10.0000 mL | INTRAVENOUS | Status: DC | PRN
  Administered 2015-06-24: 10 mL via INTRAVENOUS
  Filled 2015-06-24: qty 10

## 2015-06-24 MED ORDER — FENTANYL 25 MCG/HR TD PT72: 25.0000 ug | MEDICATED_PATCH | TRANSDERMAL | Status: DC

## 2015-06-24 MED ORDER — HYDROMORPHONE HCL 4 MG/ML IJ SOLN
INTRAMUSCULAR | Status: AC
  Filled 2015-06-24: qty 1

## 2015-06-24 MED ORDER — SODIUM CHLORIDE 0.9 % IV SOLN
Freq: Once | INTRAVENOUS | Status: AC
  Administered 2015-06-24: 10:00:00 via INTRAVENOUS

## 2015-06-24 MED FILL — fentaNYL 25 MCG/HR PT72: 25 | 30 days supply | Qty: 15 | Fill #0

## 2015-06-24 NOTE — Patient Instructions (Signed)
[facility] Discharge Instructions for Patients Receiving Chemotherapy  Today you received the following chemotherapy agents Abraxane  To help prevent nausea and vomiting after your treatment, we encourage you to take your nausea medication    If you develop nausea and vomiting that is not controlled by your nausea medication, call the clinic.   BELOW ARE SYMPTOMS THAT SHOULD BE REPORTED IMMEDIATELY:  *FEVER GREATER THAN 100.5 F  *CHILLS WITH OR WITHOUT FEVER  NAUSEA AND VOMITING THAT IS NOT CONTROLLED WITH YOUR NAUSEA MEDICATION  *UNUSUAL SHORTNESS OF BREATH  *UNUSUAL BRUISING OR BLEEDING  TENDERNESS IN MOUTH AND THROAT WITH OR WITHOUT PRESENCE OF ULCERS  *URINARY PROBLEMS  *BOWEL PROBLEMS  UNUSUAL RASH Items with * indicate a potential emergency and should be followed up as soon as possible.  Feel free to call the clinic you have any questions or concerns. The clinic phone number is [phone].  Please show the CHEMO ALERT CARD at check-in to the Emergency Department and triage nurse.   

## 2015-06-24 NOTE — Telephone Encounter (Signed)
Critical Value Calcium 5.9 Dr Marin Olp notified. No orders at this time

## 2015-06-24 NOTE — Progress Notes (Signed)
Hematology and Oncology Follow Up Visit  Kerri Vasquez [id number] [birth year] 60 y.o. 06/24/2015   Principle Diagnosis:  1. Metastatic breast cancer, progressive liver metastases  2. Gastric bypass for subsequent B12 and iron deficiency 3. Osteoarthritis with chronic pain 4. 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 Abraxane q 21 days s/p cycle 2 2. Vitamin B12 1 mg IM monthly 3. IV iron as indicated    Interim History:  Kerri Vasquez is here today with her daughter for a follow-up and day 8 of cycle 3 of Abraxane. She is feeling fatigued and states that she is having some break through back pain. She is currently has the Duragesic patch 100 mcg q 48 hrs and oxy IR 10-20 mg that she has been taking every 6 hours.  She also states that her appetite has decreased. She c/o "no taste." She is staying hydrated and her weight at this time is stable.  She has episodes of nausea that resolve with an antiemetic.  No fever, chills, cough, rash, dizziness, chest pain, palpitations or changes in bowel or bladder habits.  The neuropathy in her feet is unchanged. She has had no problems with ambulation using her cane. No syncopal episodes or falls.  She has some lymphedema swelling in the right arm. She has her prescription for a compression sleeve but has not gotten it yet.  The chronic swelling in her right lower extremity is unchanged. She continues to take lasix 20 mg daily.   Medications:    Medication List       This list is accurate as of: 06/24/15  9:30 AM.  Always use your most recent med list.               carvedilol 6.25 MG tablet  Commonly known as:  COREG  Take 1 tablet (6.25 mg total) by mouth 2 (two) times daily with a meal.     cyanocobalamin 1000 MCG/ML injection  Commonly known as:  (VITAMIN B-12)  INJECT 1 ML AS DIRECTED EVERY 21 DAYS     dexamethasone 4 MG tablet  Commonly known as:  DECADRON  TAKE 2 TABLETS BY MOUTH TWICE DAILY WITH A  MEAL. START THE DAY AFTER CHEMOTHERAPY FOR 2 DAYS.     fentaNYL 50 MCG/HR  Commonly known as:  DURAGESIC - dosed mcg/hr  Place 2 patches (100 mcg total) onto the skin every other day.     FLUoxetine 40 MG capsule  Commonly known as:  PROZAC  Take 40 mg by mouth daily before breakfast.     furosemide 20 MG tablet  Commonly known as:  LASIX  Take 60 mg by mouth daily.     levothyroxine 150 MCG tablet  Commonly known as:  SYNTHROID, LEVOTHROID  Take 1 tablet (150 mcg total) by mouth daily.     lidocaine-prilocaine cream  Commonly known as:  EMLA  Apply to affected area once     LORazepam 0.5 MG tablet  Commonly known as:  ATIVAN  PLACE 1 TABLET UNDER TONGUE EVERY 6 HOURS AS NEEDED FOR NAUSEA AND VOMITING     LYRICA 200 MG capsule  Generic drug:  pregabalin  TAKE 1 CAPSULE 2 TIMES A DAY     methocarbamol 750 MG tablet  Commonly known as:  ROBAXIN  Take 750 mg by mouth 4 (four) times daily.     MULTI-VITAMINS Tabs  Take 1 tablet by mouth daily.     nitroGLYCERIN 0.4 MG  SL tablet  Commonly known as:  NITROSTAT  Place 1 tablet (0.4 mg total) under the tongue every 5 (five) minutes as needed for chest pain.     ondansetron 8 MG tablet  Commonly known as:  ZOFRAN  Take 1 tablet (8 mg total) by mouth every 8 (eight) hours as needed for nausea or vomiting.     ondansetron 8 MG tablet  Commonly known as:  ZOFRAN  Take 1 tablet (8 mg total) by mouth 2 (two) times daily. Start the day after chemo for 2 days. Then take as needed for nausea or vomiting.     ondansetron 8 MG tablet  Commonly known as:  ZOFRAN  TAKE 1 TABLET BY MOUTH EVERY 8 HOURS AS NEEDED FOR NAUSEA AND VOMITING     oxycodone 5 MG capsule  Commonly known as:  OXY-IR  Take 2-4 capsules (10-20 mg total) by mouth every 4 (four) hours.     pantoprazole 40 MG tablet  Commonly known as:  PROTONIX  Take 1 tablet (40 mg total) by mouth daily at 12 noon.     polyethylene glycol packet  Commonly known as:  MIRALAX  / GLYCOLAX  Take 17 g by mouth 2 (two) times daily.     potassium chloride SA 20 MEQ tablet  Commonly known as:  KLOR-CON M20  Take 2 tablets (40 mEq total) by mouth 2 (two) times daily.     prochlorperazine 10 MG tablet  Commonly known as:  COMPAZINE  TAKE 1 TABLET BY MOUTH EVERY 6 HOURS AS NEEDED FOR NAUSEA AND VOMITING     promethazine 25 MG tablet  Commonly known as:  PHENERGAN  TAKE 1 TABLET BY MOUTH THREE TIMES DAILY AS NEEDED FOR NAUSEA.     temazepam 15 MG capsule  Commonly known as:  RESTORIL  Take 30 mg by mouth at bedtime.     tiZANidine 4 MG tablet  Commonly known as:  ZANAFLEX  Take 4 mg by mouth every 6 (six) hours as needed for muscle spasms.        Allergies:  Allergies  Allergen Reactions  . Influenza Vac Split [Flu Virus Vaccine] Other (See Comments)    Joint stiffness and renal failure  . Ritalin [Methylphenidate Hcl]     "Heart attack"  . Zostavax [Zoster Vaccine Live ([employer])] Other (See Comments)    Joint stiffness, renal failure  . Adhesive [Tape] Itching and Rash    Past Medical History, Surgical history, Social history, and Family History were reviewed and updated.  Review of Systems: All other 10 point review of systems is negative.   Physical Exam:  height is 5\' 6"  (1.676 m) and weight is 241 lb (109.317 kg). Her oral temperature is 97.9 F (36.6 C). Her blood pressure is 145/90 and her pulse is 76. Her respiration is 16.   Wt Readings from Last 3 Encounters:  06/24/15 241 lb (109.317 kg)  06/17/15 244 lb (110.678 kg)  06/03/15 231 lb 9.6 oz (105.053 kg)    Ocular: Sclerae unicteric, pupils equal, round and reactive to light Ear-nose-throat: Oropharynx clear, dentition fair Lymphatic: No cervical supraclavicular or axillary adenopathy Lungs no rales or rhonchi, good excursion bilaterally Heart regular rate and rhythm, no murmur appreciated Abd soft, tender in right abdomen, positive bowel sounds, no liver or spleen tip palpated  on exam MSK no focal spinal tenderness, no joint edema Neuro: non-focal, well-oriented, appropriate affect Breasts: Surgical changes due to bilateral mastectomies. No mass, lesion, rash or lymphadenopathy.  Lab Results  Component Value Date   WBC 7.2 06/24/2015   HGB 10.4* 06/24/2015   HCT 32.4* 06/24/2015   MCV 100 06/24/2015   PLT 218 06/24/2015   Lab Results  Component Value Date   FERRITIN 737* 06/17/2015   IRON 112 06/17/2015   TIBC 183* 06/17/2015   UIBC 71* 06/17/2015   IRONPCTSAT 61* 06/17/2015   Lab Results  Component Value Date   RETICCTPCT 1.0 06/29/2014   RBC 3.25* 06/24/2015   RETICCTABS 32.3 06/29/2014   No results found for: KPAFRELGTCHN, LAMBDASER, KAPLAMBRATIO No results found for: Kandis Cocking, IGMSERUM Lab Results  Component Value Date   TOTALPROTELP 4.7* 01/31/2010   ALBUMINELP 39.4* 01/31/2010   A1GS 12.1* 01/31/2010   A2GS 20.3* 01/31/2010   BETS 6.0 01/31/2010   BETA2SER 6.8* 01/31/2010   GAMS 15.4 01/31/2010   MSPIKE NOT DETECTED 01/31/2010   SPEI  01/31/2010    (NOTE) The possibility of a faint restricted band(s) cannot be completely excluded in the gamma region.  Suggest serum IFE to evaluate possibility, if clinically indicated. (Lab will hold sample one week. Please call Customer Service at [phone] to  add test.) Reviewed by Odis Hollingshead, MD, PhD, FCAP (Electronic Signature on File)     Chemistry      Component Value Date/Time   NA 135 06/17/2015 0946   NA 143 04/21/2015 0530   NA 141 03/18/2015 0918   K 5.5* 06/17/2015 0946   K 3.3* 04/21/2015 0530   K 3.8 03/18/2015 0918   CL 105 06/17/2015 0946   CL 111 04/21/2015 0530   CO2 27 06/17/2015 0946   CO2 24 04/21/2015 0530   CO2 26 03/18/2015 0918   BUN 23* 06/17/2015 0946   BUN 10 04/21/2015 0530   BUN 12.5 03/18/2015 0918   CREATININE 1.5* 06/17/2015 0946   CREATININE 1.15* 04/21/2015 0530   CREATININE 1.2* 03/18/2015 0918      Component Value Date/Time    CALCIUM 7.3* 06/17/2015 0946   CALCIUM 7.9* 04/21/2015 0530   CALCIUM 9.2 03/18/2015 0918   ALKPHOS 45 06/17/2015 0946   ALKPHOS 60 04/21/2015 0530   ALKPHOS 74 03/18/2015 0918   AST 24 06/17/2015 0946   AST 17 04/21/2015 0530   AST 21 03/18/2015 0918   ALT 29 06/17/2015 0946   ALT 12* 04/21/2015 0530   ALT 11 03/18/2015 0918   BILITOT 0.40 06/17/2015 0946   BILITOT 0.8 04/21/2015 0530   BILITOT 0.57 03/18/2015 0918     Impression and Plan: Kerri Vasquez is 59 year old female with metastatic breast cancer with mets to the liver. So far she has tolerated treatment fairly well with minimal side effects. She is feeling fatigued at this time and has c/o breakthrough back pain.  Her CBC today looks good. We will proceed with day 8 of cycle 3 as planned.  Dr. Marin Olp spoke with the patient regarding her breakthrough pain and is ok with increasing her duragesic patch to 125 mcg q 48 hours.  She has her current treatment/appointment schedule.   Both she and her daughter know to contact us with any questions or concerns. We can certainly see her sooner if need be.   Eliezer Bottom, NP 2/23/20179:30 AM

## 2015-06-29 ENCOUNTER — Other Ambulatory Visit: Payer: Self-pay | Admitting: Hematology & Oncology

## 2015-07-01 ENCOUNTER — Ambulatory Visit (HOSPITAL_BASED_OUTPATIENT_CLINIC_OR_DEPARTMENT_OTHER): Payer: Medicare Other | Admitting: Family

## 2015-07-01 ENCOUNTER — Other Ambulatory Visit (HOSPITAL_BASED_OUTPATIENT_CLINIC_OR_DEPARTMENT_OTHER): Payer: Medicare Other

## 2015-07-01 ENCOUNTER — Ambulatory Visit (HOSPITAL_BASED_OUTPATIENT_CLINIC_OR_DEPARTMENT_OTHER): Payer: Medicare Other

## 2015-07-01 ENCOUNTER — Encounter: Payer: Self-pay | Admitting: Family

## 2015-07-01 VITALS — BP 98/64 | HR 72 | Resp 18

## 2015-07-01 DIAGNOSIS — E538 Deficiency of other specified B group vitamins: Secondary | ICD-10-CM

## 2015-07-01 DIAGNOSIS — D510 Vitamin B12 deficiency anemia due to intrinsic factor deficiency: Secondary | ICD-10-CM

## 2015-07-01 DIAGNOSIS — C50919 Malignant neoplasm of unspecified site of unspecified female breast: Secondary | ICD-10-CM

## 2015-07-01 DIAGNOSIS — C50912 Malignant neoplasm of unspecified site of left female breast: Secondary | ICD-10-CM

## 2015-07-01 DIAGNOSIS — D509 Iron deficiency anemia, unspecified: Secondary | ICD-10-CM

## 2015-07-01 DIAGNOSIS — C50911 Malignant neoplasm of unspecified site of right female breast: Secondary | ICD-10-CM

## 2015-07-01 DIAGNOSIS — D500 Iron deficiency anemia secondary to blood loss (chronic): Secondary | ICD-10-CM

## 2015-07-01 DIAGNOSIS — C787 Secondary malignant neoplasm of liver and intrahepatic bile duct: Secondary | ICD-10-CM | POA: Diagnosis not present

## 2015-07-01 DIAGNOSIS — I959 Hypotension, unspecified: Secondary | ICD-10-CM | POA: Diagnosis not present

## 2015-07-01 DIAGNOSIS — I150 Renovascular hypertension: Secondary | ICD-10-CM

## 2015-07-01 LAB — CMP (CANCER CENTER ONLY)
ALBUMIN: 2.9 g/dL — AB (ref 3.3–5.5)
ALT(SGPT): 39 U/L (ref 10–47)
AST: 31 U/L (ref 11–38)
Alkaline Phosphatase: 38 U/L (ref 26–84)
BILIRUBIN TOTAL: 0.4 mg/dL (ref 0.20–1.60)
BUN, Bld: 22 mg/dL (ref 7–22)
CALCIUM: 6.6 mg/dL — AB (ref 8.0–10.3)
CHLORIDE: 107 meq/L (ref 98–108)
CO2: 22 mEq/L (ref 18–33)
CREATININE: 1.3 mg/dL — AB (ref 0.6–1.2)
Glucose, Bld: 120 mg/dL — ABNORMAL HIGH (ref 73–118)
Potassium: 4 mEq/L (ref 3.3–4.7)
Sodium: 136 mEq/L (ref 128–145)
TOTAL PROTEIN: 5.5 g/dL — AB (ref 6.4–8.1)

## 2015-07-01 LAB — CBC WITH DIFFERENTIAL (CANCER CENTER ONLY)
BASO#: 0.1 10*3/uL (ref 0.0–0.2)
BASO%: 1.7 % (ref 0.0–2.0)
EOS ABS: 0.3 10*3/uL (ref 0.0–0.5)
EOS%: 7.7 % — AB (ref 0.0–7.0)
HEMATOCRIT: 32.1 % — AB (ref 34.8–46.6)
HEMOGLOBIN: 10.3 g/dL — AB (ref 11.6–15.9)
LYMPH#: 1 10*3/uL (ref 0.9–3.3)
LYMPH%: 27.5 % (ref 14.0–48.0)
MCH: 32.8 pg (ref 26.0–34.0)
MCHC: 32.1 g/dL (ref 32.0–36.0)
MCV: 102 fL — AB (ref 81–101)
MONO#: 0.2 10*3/uL (ref 0.1–0.9)
MONO%: 5.2 % (ref 0.0–13.0)
NEUT%: 57.9 % (ref 39.6–80.0)
NEUTROS ABS: 2 10*3/uL (ref 1.5–6.5)
Platelets: 187 10*3/uL (ref 145–400)
RBC: 3.14 10*6/uL — ABNORMAL LOW (ref 3.70–5.32)
RDW: 17.8 % — ABNORMAL HIGH (ref 11.1–15.7)
WBC: 3.5 10*3/uL — ABNORMAL LOW (ref 3.9–10.0)

## 2015-07-01 LAB — CHCC SATELLITE - SMEAR

## 2015-07-01 MED ORDER — HEPARIN SOD (PORK) LOCK FLUSH 100 UNIT/ML IV SOLN
500.0000 [IU] | Freq: Once | INTRAVENOUS | Status: AC
  Administered 2015-07-01: 500 [IU] via INTRAVENOUS
  Filled 2015-07-01: qty 5

## 2015-07-01 MED ORDER — SODIUM CHLORIDE 0.9% FLUSH
10.0000 mL | INTRAVENOUS | Status: DC | PRN
  Administered 2015-07-01: 10 mL via INTRAVENOUS
  Filled 2015-07-01: qty 10

## 2015-07-01 MED ORDER — FENTANYL 50 MCG/HR TD PT72: 100.0000 ug | MEDICATED_PATCH | TRANSDERMAL | Status: DC

## 2015-07-01 MED ORDER — LORAZEPAM 0.5 MG PO TABS: ORAL_TABLET | ORAL | Status: DC

## 2015-07-01 MED ORDER — SODIUM CHLORIDE 0.9 % IV SOLN
Freq: Once | INTRAVENOUS | Status: AC
  Administered 2015-07-01: 10:00:00 via INTRAVENOUS

## 2015-07-01 MED ORDER — PANTOPRAZOLE SODIUM 40 MG PO TBEC: 40.0000 mg | DELAYED_RELEASE_TABLET | Freq: Two times a day (BID) | ORAL | Status: DC

## 2015-07-01 MED FILL — fentaNYL 50 MCG/HR PT72: 50 | 30 days supply | Qty: 30 | Fill #0

## 2015-07-01 MED FILL — PANTOPRAZOLE SOD DR 40 MG T: 40 | 30 days supply | Qty: 60 | Fill #0

## 2015-07-01 MED FILL — LORazepam 0.5 MG TABS: 0.5 | 23 days supply | Qty: 90 | Fill #0

## 2015-07-01 NOTE — Progress Notes (Signed)
Chemo appointments off somewhat.  New appointments made for patient .  No chemo today.  1400  BP 98/64.  Ok to discharge per Laverna Peace NP

## 2015-07-01 NOTE — Patient Instructions (Signed)

## 2015-07-01 NOTE — Progress Notes (Signed)
Hematology and Oncology Follow Up Visit  Kerri Vasquez [id number] [birth year] 60 y.o. 07/01/2015   Principle Diagnosis:  1. Metastatic breast cancer, progressive liver metastases  2. Gastric bypass for subsequent B12 and iron deficiency 3. Osteoarthritis with chronic pain 4. 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 Abraxane q 21 days s/p cycle 2 2. Vitamin B12 1 mg IM monthly 3. IV iron as indicated    Interim History:  Kerri Vasquez is here today with her daughter for a follow-up and treatment. She is feeling a little better this week. Her back pain and sciatica are improved since we adjusted her pain patch.  She states that she is fatigued. Her grandson came home sick several days ago and she states that she and her daughter feel like they are now coming down with colds.   She is hypotensive today at 85/53 and HR of 80. She did not take her Carvedilol this morning.  Her appetite is still down but she is trying to eat and stay hydrated. She does admit that she has not been drinking enough fluids. Her weight is down 5 lbs since her last visit.  She has had reflux bothering her at night. I will increase her Protonix to BID.  No fever, chills, cough, rash, dizziness, SOB, chest pain, palpitations or changes in bowel or bladder habits.  Her diarrhea has improved with imodium.  The neuropathy in her feet is unchanged. She has had no problems with ambulation using her cane. No syncopal episodes or falls.  She has some lymphedema swelling in the right arm. Her right lower extremity swelling is improved.  The chronic swelling in her right lower extremity is unchanged. She continues to take lasix 20 mg daily.   Medications:    Medication List       This list is accurate as of: 07/01/15  9:13 AM.  Always use your most recent med list.               carvedilol 6.25 MG tablet  Commonly known as:  COREG  Take 1 tablet (6.25 mg total) by mouth 2 (two) times  daily with a meal.     cyanocobalamin 1000 MCG/ML injection  Commonly known as:  (VITAMIN B-12)  INJECT 1 ML AS DIRECTED EVERY 21 DAYS     dexamethasone 4 MG tablet  Commonly known as:  DECADRON  TAKE 2 TABLETS BY MOUTH TWICE DAILY WITH A MEAL STARTING THE DAY AFTER CHEMOTHERAPY FOR 2 DAYS.     fentaNYL 50 MCG/HR  Commonly known as:  DURAGESIC - dosed mcg/hr  Place 2 patches (100 mcg total) onto the skin every other day.     fentaNYL 25 MCG/HR patch  Commonly known as:  DURAGESIC - dosed mcg/hr  Place 1 patch (25 mcg total) onto the skin every other day.     FLUoxetine 40 MG capsule  Commonly known as:  PROZAC  Take 40 mg by mouth daily before breakfast.     furosemide 20 MG tablet  Commonly known as:  LASIX  Take 60 mg by mouth daily.     levothyroxine 150 MCG tablet  Commonly known as:  SYNTHROID, LEVOTHROID  Take 1 tablet (150 mcg total) by mouth daily.     lidocaine-prilocaine cream  Commonly known as:  EMLA  Apply to affected area once     LORazepam 0.5 MG tablet  Commonly known as:  ATIVAN  PLACE 1 TABLET UNDER TONGUE  EVERY 6 HOURS AS NEEDED FOR NAUSEA AND VOMITING     LYRICA 200 MG capsule  Generic drug:  pregabalin  TAKE 1 CAPSULE 2 TIMES A DAY     methocarbamol 750 MG tablet  Commonly known as:  ROBAXIN  Take 750 mg by mouth 4 (four) times daily.     MULTI-VITAMINS Tabs  Take 1 tablet by mouth daily.     nitroGLYCERIN 0.4 MG SL tablet  Commonly known as:  NITROSTAT  Place 1 tablet (0.4 mg total) under the tongue every 5 (five) minutes as needed for chest pain.     ondansetron 8 MG tablet  Commonly known as:  ZOFRAN  Take 1 tablet (8 mg total) by mouth every 8 (eight) hours as needed for nausea or vomiting.     ondansetron 8 MG tablet  Commonly known as:  ZOFRAN  Take 1 tablet (8 mg total) by mouth 2 (two) times daily. Start the day after chemo for 2 days. Then take as needed for nausea or vomiting.     ondansetron 8 MG tablet  Commonly known as:   ZOFRAN  TAKE 1 TABLET BY MOUTH EVERY 8 HOURS AS NEEDED FOR NAUSEA AND VOMITING     oxycodone 5 MG capsule  Commonly known as:  OXY-IR  Take 2-4 capsules (10-20 mg total) by mouth every 4 (four) hours.     pantoprazole 40 MG tablet  Commonly known as:  PROTONIX  Take 1 tablet (40 mg total) by mouth daily at 12 noon.     polyethylene glycol packet  Commonly known as:  MIRALAX / GLYCOLAX  Take 17 g by mouth 2 (two) times daily.     potassium chloride SA 20 MEQ tablet  Commonly known as:  KLOR-CON M20  Take 2 tablets (40 mEq total) by mouth 2 (two) times daily.     prochlorperazine 10 MG tablet  Commonly known as:  COMPAZINE  TAKE 1 TABLET BY MOUTH EVERY 6 HOURS AS NEEDED FOR NAUSEA AND VOMITING     promethazine 25 MG tablet  Commonly known as:  PHENERGAN  TAKE 1 TABLET BY MOUTH THREE TIMES DAILY AS NEEDED FOR NAUSEA.     temazepam 15 MG capsule  Commonly known as:  RESTORIL  Take 30 mg by mouth at bedtime.     tiZANidine 4 MG tablet  Commonly known as:  ZANAFLEX  Take 4 mg by mouth every 6 (six) hours as needed for muscle spasms.        Allergies:  Allergies  Allergen Reactions  . Influenza Vac Split [Flu Virus Vaccine] Other (See Comments)    Joint stiffness and renal failure  . Ritalin [Methylphenidate Hcl]     "Heart attack"  . Zostavax [Zoster Vaccine Live ([employer])] Other (See Comments)    Joint stiffness, renal failure  . Adhesive [Tape] Itching and Rash    Past Medical History, Surgical history, Social history, and Family History were reviewed and updated.  Review of Systems: All other 10 point review of systems is negative.   Physical Exam:  vitals were not taken for this visit.  Wt Readings from Last 3 Encounters:  06/24/15 241 lb (109.317 kg)  06/17/15 244 lb (110.678 kg)  06/03/15 231 lb 9.6 oz (105.053 kg)    Ocular: Sclerae unicteric, pupils equal, round and reactive to light Ear-nose-throat: Oropharynx clear, dentition fair Lymphatic: No  cervical supraclavicular or axillary adenopathy Lungs no rales or rhonchi, good excursion bilaterally Heart regular rate and rhythm, no murmur  appreciated Abd soft, tender in right abdomen, positive bowel sounds, no liver or spleen tip palpated on exam MSK no focal spinal tenderness, no joint edema Neuro: non-focal, well-oriented, appropriate affect Breasts: Surgical changes due to bilateral mastectomies. No mass, lesion, rash or lymphadenopathy.  Lab Results  Component Value Date   WBC 7.2 06/24/2015   HGB 10.4* 06/24/2015   HCT 32.4* 06/24/2015   MCV 100 06/24/2015   PLT 218 06/24/2015   Lab Results  Component Value Date   FERRITIN 737* 06/17/2015   IRON 112 06/17/2015   TIBC 183* 06/17/2015   UIBC 71* 06/17/2015   IRONPCTSAT 61* 06/17/2015   Lab Results  Component Value Date   RETICCTPCT 1.0 06/29/2014   RBC 3.25* 06/24/2015   RETICCTABS 32.3 06/29/2014   No results found for: KPAFRELGTCHN, LAMBDASER, KAPLAMBRATIO No results found for: Kandis Cocking, IGMSERUM Lab Results  Component Value Date   TOTALPROTELP 4.7* 01/31/2010   ALBUMINELP 39.4* 01/31/2010   A1GS 12.1* 01/31/2010   A2GS 20.3* 01/31/2010   BETS 6.0 01/31/2010   BETA2SER 6.8* 01/31/2010   GAMS 15.4 01/31/2010   MSPIKE NOT DETECTED 01/31/2010   SPEI  01/31/2010    (NOTE) The possibility of a faint restricted band(s) cannot be completely excluded in the gamma region.  Suggest serum IFE to evaluate possibility, if clinically indicated. (Lab will hold sample one week. Please call Customer Service at [phone] to  add test.) Reviewed by Odis Hollingshead, MD, PhD, FCAP (Electronic Signature on File)     Chemistry      Component Value Date/Time   NA 139 06/24/2015 0837   NA 143 04/21/2015 0530   NA 141 03/18/2015 0918   K 4.0 06/24/2015 0837   K 3.3* 04/21/2015 0530   K 3.8 03/18/2015 0918   CL 107 06/24/2015 0837   CL 111 04/21/2015 0530   CO2 27 06/24/2015 0837   CO2 24 04/21/2015 0530   CO2  26 03/18/2015 0918   BUN 20 06/24/2015 0837   BUN 10 04/21/2015 0530   BUN 12.5 03/18/2015 0918   CREATININE 1.3* 06/24/2015 0837   CREATININE 1.15* 04/21/2015 0530   CREATININE 1.2* 03/18/2015 0918      Component Value Date/Time   CALCIUM 5.9* 06/24/2015 0837   CALCIUM 7.9* 04/21/2015 0530   CALCIUM 9.2 03/18/2015 0918   ALKPHOS 37 06/24/2015 0837   ALKPHOS 60 04/21/2015 0530   ALKPHOS 74 03/18/2015 0918   AST 31 06/24/2015 0837   AST 17 04/21/2015 0530   AST 21 03/18/2015 0918   ALT 36 06/24/2015 0837   ALT 12* 04/21/2015 0530   ALT 11 03/18/2015 0918   BILITOT 0.50 06/24/2015 0837   BILITOT 0.8 04/21/2015 0530   BILITOT 0.57 03/18/2015 0918     Impression and Plan: Kerri Vasquez is 60 year old female with metastatic breast cancer with mets to the liver. She is feeling fatigued at this time and states that she thinks she is coming down with a cold.  She is afebrile but hypotensive. We are currently giving her fluids and her BP is beginning to improve. She has not been eating well or drinking enough fluids. She will increase her calorie count and supplement with Boost as needed.  Protonix was increased to BID.  She has her current treatment/appointment schedule.   Both she and her daughter know to contact us with any questions or concerns. We can certainly see her sooner if need be.   Eliezer Bottom, NP 3/2/20179:13 AM

## 2015-07-08 ENCOUNTER — Other Ambulatory Visit (HOSPITAL_BASED_OUTPATIENT_CLINIC_OR_DEPARTMENT_OTHER): Payer: Medicare Other

## 2015-07-08 ENCOUNTER — Ambulatory Visit (HOSPITAL_BASED_OUTPATIENT_CLINIC_OR_DEPARTMENT_OTHER): Payer: Medicare Other

## 2015-07-08 ENCOUNTER — Other Ambulatory Visit: Payer: Self-pay | Admitting: Oncology

## 2015-07-08 ENCOUNTER — Telehealth: Payer: Self-pay | Admitting: *Deleted

## 2015-07-08 ENCOUNTER — Ambulatory Visit: Payer: Self-pay | Admitting: Hematology & Oncology

## 2015-07-08 ENCOUNTER — Other Ambulatory Visit: Payer: Self-pay | Admitting: Family

## 2015-07-08 ENCOUNTER — Other Ambulatory Visit: Payer: Self-pay

## 2015-07-08 ENCOUNTER — Ambulatory Visit: Payer: Self-pay

## 2015-07-08 VITALS — BP 111/59 | HR 94 | Temp 97.5°F | Resp 16 | Wt 239.1 lb

## 2015-07-08 DIAGNOSIS — C787 Secondary malignant neoplasm of liver and intrahepatic bile duct: Principal | ICD-10-CM

## 2015-07-08 DIAGNOSIS — I150 Renovascular hypertension: Secondary | ICD-10-CM

## 2015-07-08 DIAGNOSIS — Z5111 Encounter for antineoplastic chemotherapy: Secondary | ICD-10-CM

## 2015-07-08 DIAGNOSIS — D500 Iron deficiency anemia secondary to blood loss (chronic): Secondary | ICD-10-CM

## 2015-07-08 DIAGNOSIS — C50912 Malignant neoplasm of unspecified site of left female breast: Secondary | ICD-10-CM

## 2015-07-08 DIAGNOSIS — G894 Chronic pain syndrome: Secondary | ICD-10-CM | POA: Diagnosis not present

## 2015-07-08 DIAGNOSIS — E538 Deficiency of other specified B group vitamins: Secondary | ICD-10-CM

## 2015-07-08 DIAGNOSIS — C50919 Malignant neoplasm of unspecified site of unspecified female breast: Secondary | ICD-10-CM

## 2015-07-08 DIAGNOSIS — C50911 Malignant neoplasm of unspecified site of right female breast: Secondary | ICD-10-CM

## 2015-07-08 DIAGNOSIS — G603 Idiopathic progressive neuropathy: Secondary | ICD-10-CM | POA: Diagnosis not present

## 2015-07-08 DIAGNOSIS — M4726 Other spondylosis with radiculopathy, lumbar region: Secondary | ICD-10-CM | POA: Diagnosis not present

## 2015-07-08 DIAGNOSIS — Z79891 Long term (current) use of opiate analgesic: Secondary | ICD-10-CM | POA: Diagnosis not present

## 2015-07-08 DIAGNOSIS — D510 Vitamin B12 deficiency anemia due to intrinsic factor deficiency: Secondary | ICD-10-CM

## 2015-07-08 LAB — CBC WITH DIFFERENTIAL (CANCER CENTER ONLY)
BASO#: 0 10*3/uL (ref 0.0–0.2)
BASO%: 0.3 % (ref 0.0–2.0)
EOS%: 3.2 % (ref 0.0–7.0)
Eosinophils Absolute: 0.2 10*3/uL (ref 0.0–0.5)
HCT: 35.2 % (ref 34.8–46.6)
HEMOGLOBIN: 11.5 g/dL — AB (ref 11.6–15.9)
LYMPH#: 2 10*3/uL (ref 0.9–3.3)
LYMPH%: 28.5 % (ref 14.0–48.0)
MCH: 33 pg (ref 26.0–34.0)
MCHC: 32.7 g/dL (ref 32.0–36.0)
MCV: 101 fL (ref 81–101)
MONO#: 0.4 10*3/uL (ref 0.1–0.9)
MONO%: 5 % (ref 0.0–13.0)
NEUT%: 63 % (ref 39.6–80.0)
NEUTROS ABS: 4.5 10*3/uL (ref 1.5–6.5)
Platelets: 180 10*3/uL (ref 145–400)
RBC: 3.48 10*6/uL — AB (ref 3.70–5.32)
RDW: 17.5 % — ABNORMAL HIGH (ref 11.1–15.7)
WBC: 7.1 10*3/uL (ref 3.9–10.0)

## 2015-07-08 LAB — CMP (CANCER CENTER ONLY)
ALBUMIN: 2.9 g/dL — AB (ref 3.3–5.5)
ALT(SGPT): 30 U/L (ref 10–47)
AST: 26 U/L (ref 11–38)
Alkaline Phosphatase: 51 U/L (ref 26–84)
BUN: 18 mg/dL (ref 7–22)
CHLORIDE: 103 meq/L (ref 98–108)
CO2: 27 meq/L (ref 18–33)
CREATININE: 1.6 mg/dL — AB (ref 0.6–1.2)
Calcium: 6.2 mg/dL — CL (ref 8.0–10.3)
Glucose, Bld: 122 mg/dL — ABNORMAL HIGH (ref 73–118)
POTASSIUM: 3.2 meq/L — AB (ref 3.3–4.7)
SODIUM: 142 meq/L (ref 128–145)
TOTAL PROTEIN: 5.9 g/dL — AB (ref 6.4–8.1)
Total Bilirubin: 0.5 mg/dl (ref 0.20–1.60)

## 2015-07-08 LAB — TECHNOLOGIST REVIEW CHCC SATELLITE

## 2015-07-08 MED ORDER — HYDROMORPHONE HCL 4 MG/ML IJ SOLN
INTRAMUSCULAR | Status: AC
  Filled 2015-07-08: qty 1

## 2015-07-08 MED ORDER — CYANOCOBALAMIN 1000 MCG/ML IJ SOLN
1000.0000 ug | Freq: Once | INTRAMUSCULAR | Status: AC
  Administered 2015-07-08: 1000 ug via INTRAMUSCULAR

## 2015-07-08 MED ORDER — SODIUM CHLORIDE 0.9 % IJ SOLN
10.0000 mL | INTRAMUSCULAR | Status: DC | PRN
  Administered 2015-07-08: 10 mL
  Filled 2015-07-08: qty 10

## 2015-07-08 MED ORDER — CYANOCOBALAMIN 1000 MCG/ML IJ SOLN
INTRAMUSCULAR | Status: AC
  Filled 2015-07-08: qty 1

## 2015-07-08 MED ORDER — HYDROMORPHONE HCL 4 MG/ML IJ SOLN
4.0000 mg | Freq: Once | INTRAMUSCULAR | Status: AC
  Administered 2015-07-08: 4 mg via INTRAVENOUS

## 2015-07-08 MED ORDER — PALONOSETRON HCL INJECTION 0.25 MG/5ML
0.2500 mg | Freq: Once | INTRAVENOUS | Status: AC
  Administered 2015-07-08: 0.25 mg via INTRAVENOUS

## 2015-07-08 MED ORDER — PACLITAXEL PROTEIN-BOUND CHEMO INJECTION 100 MG
100.0000 mg/m2 | Freq: Once | INTRAVENOUS | Status: AC
  Administered 2015-07-08: 225 mg via INTRAVENOUS
  Filled 2015-07-08: qty 45

## 2015-07-08 MED ORDER — PROCHLORPERAZINE MALEATE 10 MG PO TABS: ORAL_TABLET | ORAL | Status: DC

## 2015-07-08 MED ORDER — HEPARIN SOD (PORK) LOCK FLUSH 100 UNIT/ML IV SOLN
500.0000 [IU] | Freq: Once | INTRAVENOUS | Status: AC | PRN
  Administered 2015-07-08: 500 [IU]
  Filled 2015-07-08: qty 5

## 2015-07-08 MED ORDER — SODIUM CHLORIDE 0.9 % IV SOLN
Freq: Once | INTRAVENOUS | Status: AC
  Administered 2015-07-08: 12:00:00 via INTRAVENOUS
  Filled 2015-07-08: qty 5

## 2015-07-08 MED ORDER — PALONOSETRON HCL INJECTION 0.25 MG/5ML
INTRAVENOUS | Status: AC
  Filled 2015-07-08: qty 5

## 2015-07-08 MED ORDER — SODIUM CHLORIDE 0.9 % IV SOLN
Freq: Once | INTRAVENOUS | Status: AC
  Administered 2015-07-08: 11:00:00 via INTRAVENOUS

## 2015-07-08 MED FILL — OXYCODONE-APAP 10-325 TAB: 10-325 | 30 days supply | Qty: 180 | Fill #0

## 2015-07-08 MED FILL — oxyCODONE HCL 10 MG TABS: 10 | 30 days supply | Qty: 180 | Fill #0

## 2015-07-08 NOTE — Progress Notes (Signed)
Ok to treat with low calcium and high creatinine. Refill sent for Compazine.

## 2015-07-08 NOTE — Patient Instructions (Signed)

## 2015-07-08 NOTE — Telephone Encounter (Signed)
Critical Value Calcium 6.2 Dr Ennever notified. No orders at this time 

## 2015-07-15 ENCOUNTER — Other Ambulatory Visit (HOSPITAL_BASED_OUTPATIENT_CLINIC_OR_DEPARTMENT_OTHER): Payer: Medicare Other

## 2015-07-15 ENCOUNTER — Other Ambulatory Visit: Payer: Self-pay

## 2015-07-15 ENCOUNTER — Ambulatory Visit (HOSPITAL_BASED_OUTPATIENT_CLINIC_OR_DEPARTMENT_OTHER): Payer: Medicare Other

## 2015-07-15 ENCOUNTER — Other Ambulatory Visit: Payer: Self-pay | Admitting: Hematology & Oncology

## 2015-07-15 VITALS — BP 107/59 | HR 88 | Temp 97.8°F | Resp 20 | Wt 239.0 lb

## 2015-07-15 DIAGNOSIS — G441 Vascular headache, not elsewhere classified: Secondary | ICD-10-CM | POA: Diagnosis not present

## 2015-07-15 DIAGNOSIS — D500 Iron deficiency anemia secondary to blood loss (chronic): Secondary | ICD-10-CM

## 2015-07-15 DIAGNOSIS — G8929 Other chronic pain: Secondary | ICD-10-CM

## 2015-07-15 DIAGNOSIS — C787 Secondary malignant neoplasm of liver and intrahepatic bile duct: Secondary | ICD-10-CM

## 2015-07-15 DIAGNOSIS — C50919 Malignant neoplasm of unspecified site of unspecified female breast: Secondary | ICD-10-CM

## 2015-07-15 DIAGNOSIS — C50912 Malignant neoplasm of unspecified site of left female breast: Secondary | ICD-10-CM

## 2015-07-15 DIAGNOSIS — M545 Low back pain, unspecified: Secondary | ICD-10-CM

## 2015-07-15 DIAGNOSIS — Z5111 Encounter for antineoplastic chemotherapy: Secondary | ICD-10-CM

## 2015-07-15 DIAGNOSIS — C50911 Malignant neoplasm of unspecified site of right female breast: Secondary | ICD-10-CM

## 2015-07-15 DIAGNOSIS — D510 Vitamin B12 deficiency anemia due to intrinsic factor deficiency: Secondary | ICD-10-CM | POA: Diagnosis not present

## 2015-07-15 DIAGNOSIS — I150 Renovascular hypertension: Secondary | ICD-10-CM

## 2015-07-15 LAB — IRON AND TIBC
%SAT: 24 % (ref 21–57)
IRON: 43 ug/dL (ref 41–142)
TIBC: 176 ug/dL — ABNORMAL LOW (ref 236–444)
UIBC: 133 ug/dL (ref 120–384)

## 2015-07-15 LAB — CBC WITH DIFFERENTIAL (CANCER CENTER ONLY)
BASO#: 0.1 10*3/uL (ref 0.0–0.2)
BASO%: 0.9 % (ref 0.0–2.0)
EOS%: 4.7 % (ref 0.0–7.0)
Eosinophils Absolute: 0.3 10*3/uL (ref 0.0–0.5)
HEMATOCRIT: 34.9 % (ref 34.8–46.6)
HGB: 11.4 g/dL — ABNORMAL LOW (ref 11.6–15.9)
LYMPH#: 1.4 10*3/uL (ref 0.9–3.3)
LYMPH%: 21.8 % (ref 14.0–48.0)
MCH: 33.4 pg (ref 26.0–34.0)
MCHC: 32.7 g/dL (ref 32.0–36.0)
MCV: 102 fL — ABNORMAL HIGH (ref 81–101)
MONO#: 0.3 10*3/uL (ref 0.1–0.9)
MONO%: 4.1 % (ref 0.0–13.0)
NEUT#: 4.5 10*3/uL (ref 1.5–6.5)
NEUT%: 68.5 % (ref 39.6–80.0)
PLATELETS: 175 10*3/uL (ref 145–400)
RBC: 3.41 10*6/uL — ABNORMAL LOW (ref 3.70–5.32)
RDW: 16.7 % — AB (ref 11.1–15.7)
WBC: 6.6 10*3/uL (ref 3.9–10.0)

## 2015-07-15 LAB — CMP (CANCER CENTER ONLY)
ALK PHOS: 55 U/L (ref 26–84)
ALT: 75 U/L — AB (ref 10–47)
AST: 49 U/L — ABNORMAL HIGH (ref 11–38)
Albumin: 2.9 g/dL — ABNORMAL LOW (ref 3.3–5.5)
BUN, Bld: 18 mg/dL (ref 7–22)
CALCIUM: 7.2 mg/dL — AB (ref 8.0–10.3)
CHLORIDE: 102 meq/L (ref 98–108)
CO2: 30 mEq/L (ref 18–33)
Creat: 1.7 mg/dl — ABNORMAL HIGH (ref 0.6–1.2)
Glucose, Bld: 102 mg/dL (ref 73–118)
POTASSIUM: 4.1 meq/L (ref 3.3–4.7)
Sodium: 137 mEq/L (ref 128–145)
TOTAL PROTEIN: 5.4 g/dL — AB (ref 6.4–8.1)
Total Bilirubin: 0.5 mg/dl (ref 0.20–1.60)

## 2015-07-15 LAB — FERRITIN: FERRITIN: 839 ng/mL — AB (ref 9–269)

## 2015-07-15 LAB — CHCC SATELLITE - SMEAR

## 2015-07-15 MED ORDER — PALONOSETRON HCL INJECTION 0.25 MG/5ML
INTRAVENOUS | Status: AC
  Filled 2015-07-15: qty 5

## 2015-07-15 MED ORDER — PALONOSETRON HCL INJECTION 0.25 MG/5ML
0.2500 mg | Freq: Once | INTRAVENOUS | Status: AC
  Administered 2015-07-15: 0.25 mg via INTRAVENOUS

## 2015-07-15 MED ORDER — SODIUM CHLORIDE 0.9 % IJ SOLN
10.0000 mL | INTRAMUSCULAR | Status: DC | PRN
  Administered 2015-07-15: 10 mL
  Filled 2015-07-15: qty 10

## 2015-07-15 MED ORDER — FENTANYL 25 MCG/HR TD PT72: 25.0000 ug | MEDICATED_PATCH | TRANSDERMAL | Status: DC

## 2015-07-15 MED ORDER — PACLITAXEL PROTEIN-BOUND CHEMO INJECTION 100 MG
100.0000 mg/m2 | Freq: Once | INTRAVENOUS | Status: AC
  Administered 2015-07-15: 225 mg via INTRAVENOUS
  Filled 2015-07-15: qty 45

## 2015-07-15 MED ORDER — HEPARIN SOD (PORK) LOCK FLUSH 100 UNIT/ML IV SOLN
500.0000 [IU] | Freq: Once | INTRAVENOUS | Status: AC | PRN
  Administered 2015-07-15: 500 [IU]
  Filled 2015-07-15: qty 5

## 2015-07-15 MED ORDER — SODIUM CHLORIDE 0.9 % IV SOLN
Freq: Once | INTRAVENOUS | Status: AC
  Administered 2015-07-15: 11:00:00 via INTRAVENOUS
  Filled 2015-07-15: qty 5

## 2015-07-15 MED ORDER — SODIUM CHLORIDE 0.9 % IV SOLN
Freq: Once | INTRAVENOUS | Status: AC
Start: 2015-07-15 — End: 2015-07-15
  Administered 2015-07-15: 11:00:00 via INTRAVENOUS

## 2015-07-15 MED FILL — fentaNYL 25 MCG/HR PT72: 25 | 30 days supply | Qty: 15 | Fill #0

## 2015-07-15 NOTE — Patient Instructions (Signed)

## 2015-07-16 LAB — VITAMIN B12: Vitamin B12: 624 pg/mL (ref 211–946)

## 2015-07-19 ENCOUNTER — Other Ambulatory Visit: Payer: Self-pay | Admitting: Hematology & Oncology

## 2015-07-19 ENCOUNTER — Other Ambulatory Visit: Payer: Self-pay | Admitting: Family

## 2015-07-22 ENCOUNTER — Other Ambulatory Visit: Payer: Self-pay

## 2015-07-22 ENCOUNTER — Ambulatory Visit: Payer: Self-pay

## 2015-07-22 ENCOUNTER — Ambulatory Visit: Payer: Self-pay | Admitting: Hematology & Oncology

## 2015-07-28 ENCOUNTER — Other Ambulatory Visit: Payer: Self-pay | Admitting: *Deleted

## 2015-07-28 DIAGNOSIS — C787 Secondary malignant neoplasm of liver and intrahepatic bile duct: Principal | ICD-10-CM

## 2015-07-28 DIAGNOSIS — C50912 Malignant neoplasm of unspecified site of left female breast: Secondary | ICD-10-CM

## 2015-07-29 ENCOUNTER — Encounter: Payer: Self-pay | Admitting: Hematology & Oncology

## 2015-07-29 ENCOUNTER — Ambulatory Visit (HOSPITAL_BASED_OUTPATIENT_CLINIC_OR_DEPARTMENT_OTHER): Payer: Medicare Other

## 2015-07-29 ENCOUNTER — Other Ambulatory Visit (HOSPITAL_BASED_OUTPATIENT_CLINIC_OR_DEPARTMENT_OTHER): Payer: Medicare Other

## 2015-07-29 ENCOUNTER — Ambulatory Visit (HOSPITAL_BASED_OUTPATIENT_CLINIC_OR_DEPARTMENT_OTHER): Payer: Medicare Other | Admitting: Hematology & Oncology

## 2015-07-29 VITALS — BP 101/60 | HR 78 | Temp 98.7°F | Resp 18 | Ht 66.0 in | Wt 237.0 lb

## 2015-07-29 DIAGNOSIS — C787 Secondary malignant neoplasm of liver and intrahepatic bile duct: Secondary | ICD-10-CM

## 2015-07-29 DIAGNOSIS — C772 Secondary and unspecified malignant neoplasm of intra-abdominal lymph nodes: Secondary | ICD-10-CM | POA: Diagnosis not present

## 2015-07-29 DIAGNOSIS — D500 Iron deficiency anemia secondary to blood loss (chronic): Secondary | ICD-10-CM

## 2015-07-29 DIAGNOSIS — Z5111 Encounter for antineoplastic chemotherapy: Secondary | ICD-10-CM

## 2015-07-29 DIAGNOSIS — K909 Intestinal malabsorption, unspecified: Secondary | ICD-10-CM | POA: Diagnosis not present

## 2015-07-29 DIAGNOSIS — E538 Deficiency of other specified B group vitamins: Secondary | ICD-10-CM

## 2015-07-29 DIAGNOSIS — C50919 Malignant neoplasm of unspecified site of unspecified female breast: Secondary | ICD-10-CM

## 2015-07-29 DIAGNOSIS — G4701 Insomnia due to medical condition: Secondary | ICD-10-CM | POA: Diagnosis not present

## 2015-07-29 DIAGNOSIS — D510 Vitamin B12 deficiency anemia due to intrinsic factor deficiency: Secondary | ICD-10-CM | POA: Diagnosis not present

## 2015-07-29 DIAGNOSIS — C50912 Malignant neoplasm of unspecified site of left female breast: Secondary | ICD-10-CM

## 2015-07-29 HISTORY — DX: Intestinal malabsorption, unspecified: K90.9

## 2015-07-29 HISTORY — DX: Iron deficiency anemia secondary to blood loss (chronic): D50.0

## 2015-07-29 LAB — CBC WITH DIFFERENTIAL (CANCER CENTER ONLY)
BASO#: 0.1 10*3/uL (ref 0.0–0.2)
BASO%: 0.8 % (ref 0.0–2.0)
EOS ABS: 0.3 10*3/uL (ref 0.0–0.5)
EOS%: 4 % (ref 0.0–7.0)
HCT: 30.8 % — ABNORMAL LOW (ref 34.8–46.6)
HEMOGLOBIN: 10.3 g/dL — AB (ref 11.6–15.9)
LYMPH#: 1.8 10*3/uL (ref 0.9–3.3)
LYMPH%: 27.2 % (ref 14.0–48.0)
MCH: 34.1 pg — AB (ref 26.0–34.0)
MCHC: 33.4 g/dL (ref 32.0–36.0)
MCV: 102 fL — AB (ref 81–101)
MONO#: 0.7 10*3/uL (ref 0.1–0.9)
MONO%: 10 % (ref 0.0–13.0)
NEUT%: 58 % (ref 39.6–80.0)
NEUTROS ABS: 3.8 10*3/uL (ref 1.5–6.5)
PLATELETS: 220 10*3/uL (ref 145–400)
RBC: 3.02 10*6/uL — AB (ref 3.70–5.32)
RDW: 17.1 % — ABNORMAL HIGH (ref 11.1–15.7)
WBC: 6.6 10*3/uL (ref 3.9–10.0)

## 2015-07-29 LAB — CMP (CANCER CENTER ONLY)
ALBUMIN: 2.8 g/dL — AB (ref 3.3–5.5)
ALK PHOS: 37 U/L (ref 26–84)
ALT: 40 U/L (ref 10–47)
AST: 27 U/L (ref 11–38)
BUN: 24 mg/dL — AB (ref 7–22)
CHLORIDE: 102 meq/L (ref 98–108)
CO2: 28 mEq/L (ref 18–33)
CREATININE: 1.3 mg/dL — AB (ref 0.6–1.2)
Calcium: 6.2 mg/dL — CL (ref 8.0–10.3)
Glucose, Bld: 97 mg/dL (ref 73–118)
Potassium: 4.1 mEq/L (ref 3.3–4.7)
SODIUM: 135 meq/L (ref 128–145)
TOTAL PROTEIN: 5 g/dL — AB (ref 6.4–8.1)
Total Bilirubin: 0.6 mg/dl (ref 0.20–1.60)

## 2015-07-29 LAB — TECHNOLOGIST REVIEW CHCC SATELLITE

## 2015-07-29 MED ORDER — CYANOCOBALAMIN 1000 MCG/ML IJ SOLN
1000.0000 ug | Freq: Once | INTRAMUSCULAR | Status: AC
  Administered 2015-07-29: 1000 ug via INTRAMUSCULAR

## 2015-07-29 MED ORDER — SODIUM CHLORIDE 0.9 % IJ SOLN
10.0000 mL | INTRAMUSCULAR | Status: DC | PRN
  Administered 2015-07-29: 10 mL
  Filled 2015-07-29: qty 10

## 2015-07-29 MED ORDER — FOSAPREPITANT DIMEGLUMINE INJECTION 150 MG
Freq: Once | INTRAVENOUS | Status: AC
  Administered 2015-07-29: 14:00:00 via INTRAVENOUS
  Filled 2015-07-29: qty 5

## 2015-07-29 MED ORDER — PALONOSETRON HCL INJECTION 0.25 MG/5ML
0.2500 mg | Freq: Once | INTRAVENOUS | Status: AC
  Administered 2015-07-29: 0.25 mg via INTRAVENOUS

## 2015-07-29 MED ORDER — HEPARIN SOD (PORK) LOCK FLUSH 100 UNIT/ML IV SOLN
500.0000 [IU] | Freq: Once | INTRAVENOUS | Status: AC | PRN
  Administered 2015-07-29: 500 [IU]
  Filled 2015-07-29: qty 5

## 2015-07-29 MED ORDER — CYANOCOBALAMIN 1000 MCG/ML IJ SOLN
INTRAMUSCULAR | Status: AC
  Filled 2015-07-29: qty 1

## 2015-07-29 MED ORDER — SODIUM CHLORIDE 0.9 % IV SOLN
Freq: Once | INTRAVENOUS | Status: AC
  Administered 2015-07-29: 13:00:00 via INTRAVENOUS

## 2015-07-29 MED ORDER — PALONOSETRON HCL INJECTION 0.25 MG/5ML
INTRAVENOUS | Status: AC
  Filled 2015-07-29: qty 5

## 2015-07-29 MED ORDER — PACLITAXEL PROTEIN-BOUND CHEMO INJECTION 100 MG
100.0000 mg/m2 | Freq: Once | INTRAVENOUS | Status: AC
  Administered 2015-07-29: 225 mg via INTRAVENOUS
  Filled 2015-07-29: qty 45

## 2015-07-29 MED FILL — fentaNYL 100 MCG/HR PT72: 100 | 30 days supply | Qty: 15 | Fill #0

## 2015-07-29 MED FILL — LORazepam 0.5 MG TABS: 0.5 | 23 days supply | Qty: 90 | Fill #0

## 2015-07-29 NOTE — Progress Notes (Signed)
1:34 PM  OK to treat with Calcium:  6.2 per Dr Marin Olp.

## 2015-07-29 NOTE — Patient Instructions (Signed)

## 2015-07-29 NOTE — Progress Notes (Signed)
Hematology and Oncology Follow Up Visit  Revae Denninger [id number] [birth year] 60 y.o. 07/29/2015   Principle Diagnosis:  1. Metastatic breast cancer, progressive liver metastases  2. Gastric bypass for subsequent B12 and iron deficiency 3. Osteoarthritis with chronic pain 4. 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 Abraxane q 21 days s/p cycle 4 2. Vitamin B12 1 mg IM monthly 3. IV iron as indicated    Interim History:  [name] is here today with her daughter for a follow-up and treatment. She is feeling a little better this week. Her back pain and sciatica are improved since we adjusted her pain patch.  Her appetite is still down. However, her weight is up a little bit. She has had no nausea or vomiting. She's had no diarrhea. She's had no cough or shortness of breath. She's had no leg swelling. She's had no rashes.  Her last CA 27.29 was up a little bit. It was 69 back in February.  She's had no fever. She's had no issues with infections.  She has chronic back problems which is not from her metastatic breast cancer. Thankfully, this is some that we do have to treat with radiation.  I would have say that her performance status is ECOG 1-2.  She's not noted any bleeding. She has had a gastric bypass. 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 She is iron deficiency because of the gastric bypass and malabsorption.   Medications:    Medication List       This list is accurate as of: 07/29/15  2:57 PM.  Always use your most recent med list.               carvedilol 6.25 MG tablet  Commonly known as:  COREG  Take 1 tablet (6.25 mg total) by mouth 2 (two) times daily with a meal.     cyanocobalamin 1000 MCG/ML injection  Commonly known as:  (VITAMIN B-12)  INJECT 1 ML AS DIRECTED EVERY 21 DAYS     dexamethasone 4 MG tablet  Commonly known as:  DECADRON  TAKE 2 TABLETS BY MOUTH TWICE DAILY WITH A MEAL STARTING THE DAY  AFTER CHEMOTHERAPY FOR 2 DAYS     fentaNYL 50 MCG/HR  Commonly known as:  DURAGESIC - dosed mcg/hr  Place 2 patches (100 mcg total) onto the skin every other day.     fentaNYL 25 MCG/HR patch  Commonly known as:  DURAGESIC - dosed mcg/hr  Place 1 patch (25 mcg total) onto the skin every other day.     FLUoxetine 40 MG capsule  Commonly known as:  PROZAC  Take 40 mg by mouth daily before breakfast.     furosemide 20 MG tablet  Commonly known as:  LASIX  Take 60 mg by mouth daily.     levothyroxine 150 MCG tablet  Commonly known as:  SYNTHROID, LEVOTHROID  Take 1 tablet (150 mcg total) by mouth daily.     lidocaine-prilocaine cream  Commonly known as:  EMLA  Apply to affected area once     LORazepam 0.5 MG tablet  Commonly known as:  ATIVAN  PLACE 1 TABLET UNDER TONGUE EVERY 6 HOURS AS NEEDED FOR NAUSEA AND VOMITING     LYRICA 200 MG capsule  Generic drug:  pregabalin  TAKE 1 CAPSULE 2 TIMES A DAY     methocarbamol 750 MG tablet  Commonly known as:  ROBAXIN  Take 750 mg by  mouth 4 (four) times daily.     MULTI-VITAMINS Tabs  Take 1 tablet by mouth daily.     nitroGLYCERIN 0.4 MG SL tablet  Commonly known as:  NITROSTAT  Place 1 tablet (0.4 mg total) under the tongue every 5 (five) minutes as needed for chest pain.     ondansetron 8 MG tablet  Commonly known as:  ZOFRAN  Take 1 tablet (8 mg total) by mouth every 8 (eight) hours as needed for nausea or vomiting.     ondansetron 8 MG tablet  Commonly known as:  ZOFRAN  TAKE 1 TABLET BY MOUTH EVERY 8 HOURS AS NEEDED FOR NAUSEA AND VOMITING     oxycodone 5 MG capsule  Commonly known as:  OXY-IR  Take 2-4 capsules (10-20 mg total) by mouth every 4 (four) hours.     pantoprazole 40 MG tablet  Commonly known as:  PROTONIX  Take 1 tablet (40 mg total) by mouth 2 (two) times daily.     polyethylene glycol packet  Commonly known as:  MIRALAX / GLYCOLAX  Take 17 g by mouth 2 (two) times daily.     potassium chloride  SA 20 MEQ tablet  Commonly known as:  KLOR-CON M20  Take 2 tablets (40 mEq total) by mouth 2 (two) times daily.     prochlorperazine 10 MG tablet  Commonly known as:  COMPAZINE  TAKE 1 TABLET BY MOUTH EVERY 6 HOURS AS NEEDED FOR NAUSEA AND VOMITING     promethazine 25 MG tablet  Commonly known as:  PHENERGAN  TAKE 1 TABLET BY MOUTH THREE TIMES DAILY AS NEEDED FOR NAUSEA     temazepam 15 MG capsule  Commonly known as:  RESTORIL  Take 30 mg by mouth at bedtime.     tiZANidine 4 MG tablet  Commonly known as:  ZANAFLEX  Take 4 mg by mouth every 6 (six) hours as needed for muscle spasms.        Allergies:  Allergies  Allergen Reactions  . Influenza Vac Split [Flu Virus Vaccine] Other (See Comments)    Joint stiffness and renal failure  . Ritalin [Methylphenidate Hcl]     "Heart attack"  . Zostavax [Zoster Vaccine Live ([employer])] Other (See Comments)    Joint stiffness, renal failure  . Adhesive [Tape] Itching and Rash    Past Medical History, Surgical history, Social history, and Family History were reviewed and updated.  Review of Systems: All other 10 point review of systems is negative.   Physical Exam:  height is 5\' 6"  (1.676 m) and weight is 237 lb (107.502 kg). Her oral temperature is 98.7 F (37.1 C). Her blood pressure is 101/60 and her pulse is 78. Her respiration is 18.   Wt Readings from Last 3 Encounters:  07/29/15 237 lb (107.502 kg)  07/15/15 239 lb (108.41 kg)  07/08/15 239 lb 1.9 oz (108.464 kg)    Obese white female in no obvious distress. Head and neck exam shows no ocular or oral lesions. She has no scleral icterus. She has no adenopathy in the neck. She has no palpable thyroid. Lungs are with decreased at the bases. Cardiac exam regular rate and rhythm with no murmurs, rubs or bruits. Abdomen is obese but soft. She has good bowel sounds. She'll a little bit of a abdominal wall hernia. This is chronic. This reduces easily. There is no palpable  hepatomegaly. Extremities shows chronic nonpitting edema of the lower legs. She has decent range of motion of her  joints. Skin exam shows no rashes. Neurological exam shows no focal neurological deficits. .  Lab Results  Component Value Date   WBC 6.6 07/29/2015   HGB 10.3* 07/29/2015   HCT 30.8* 07/29/2015   MCV 102* 07/29/2015   PLT 220 07/29/2015   Lab Results  Component Value Date   FERRITIN 839* 07/15/2015   IRON 43 07/15/2015   TIBC 176* 07/15/2015   UIBC 133 07/15/2015   IRONPCTSAT 24 07/15/2015   Lab Results  Component Value Date   RETICCTPCT 1.0 06/29/2014   RBC 3.02* 07/29/2015   RETICCTABS 32.3 06/29/2014   No results found for: KPAFRELGTCHN, LAMBDASER, KAPLAMBRATIO No results found for: Kandis Cocking, IGMSERUM Lab Results  Component Value Date   TOTALPROTELP 4.7* 01/31/2010   ALBUMINELP 39.4* 01/31/2010   A1GS 12.1* 01/31/2010   A2GS 20.3* 01/31/2010   BETS 6.0 01/31/2010   BETA2SER 6.8* 01/31/2010   GAMS 15.4 01/31/2010   MSPIKE NOT DETECTED 01/31/2010   SPEI  01/31/2010    (NOTE) The possibility of a faint restricted band(s) cannot be completely excluded in the gamma region.  Suggest serum IFE to evaluate possibility, if clinically indicated. (Lab will hold sample one week. Please call Customer Service at [phone] to  add test.) Reviewed by Odis Hollingshead, MD, PhD, FCAP (Electronic Signature on File)     Chemistry      Component Value Date/Time   NA 135 07/29/2015 1202   NA 143 04/21/2015 0530   NA 141 03/18/2015 0918   K 4.1 07/29/2015 1202   K 3.3* 04/21/2015 0530   K 3.8 03/18/2015 0918   CL 102 07/29/2015 1202   CL 111 04/21/2015 0530   CO2 28 07/29/2015 1202   CO2 24 04/21/2015 0530   CO2 26 03/18/2015 0918   BUN 24* 07/29/2015 1202   BUN 10 04/21/2015 0530   BUN 12.5 03/18/2015 0918   CREATININE 1.3* 07/29/2015 1202   CREATININE 1.15* 04/21/2015 0530   CREATININE 1.2* 03/18/2015 0918      Component Value Date/Time   CALCIUM  6.2* 07/29/2015 1202   CALCIUM 7.9* 04/21/2015 0530   CALCIUM 9.2 03/18/2015 0918   ALKPHOS 37 07/29/2015 1202   ALKPHOS 60 04/21/2015 0530   ALKPHOS 74 03/18/2015 0918   AST 27 07/29/2015 1202   AST 17 04/21/2015 0530   AST 21 03/18/2015 0918   ALT 40 07/29/2015 1202   ALT 12* 04/21/2015 0530   ALT 11 03/18/2015 0918   BILITOT 0.60 07/29/2015 1202   BILITOT 0.8 04/21/2015 0530   BILITOT 0.57 03/18/2015 0918     Impression and Plan: [name] is 60 year old white female with metastatic breast cancer. She also has some mesenteric lymph node involvement.   We have her on systemic chemotherapy. She is on Abraxane.  I think we will have to get another PET scan on her. This will tell us how well treatment is working. I think just be very helpful in [place] to decide how we can further treat her. Hopefully, we'll see that she is responding and if so, we can continue her on the Abraxane. If we find that she is progressing, then we will have to consider other options.  She'll go ahead and get vitamin B-12 today.  I will see what her iron studies show.  I spent about 30 minutes with her today.  Volanda Napoleon, MD 3/30/20172:57 PM

## 2015-07-30 LAB — CANCER ANTIGEN 27.29: CAN 27.29: 63.8 U/mL — AB (ref 0.0–38.6)

## 2015-07-30 LAB — CANCER ANTIGEN 27-29 (PARALLEL TESTING): CA 27.29: 60 U/mL — AB (ref <38)

## 2015-08-04 DIAGNOSIS — C50919 Malignant neoplasm of unspecified site of unspecified female breast: Secondary | ICD-10-CM | POA: Diagnosis not present

## 2015-08-04 DIAGNOSIS — H2513 Age-related nuclear cataract, bilateral: Secondary | ICD-10-CM | POA: Diagnosis not present

## 2015-08-04 DIAGNOSIS — H538 Other visual disturbances: Secondary | ICD-10-CM | POA: Diagnosis not present

## 2015-08-05 ENCOUNTER — Other Ambulatory Visit: Payer: Self-pay | Admitting: Hematology & Oncology

## 2015-08-05 ENCOUNTER — Other Ambulatory Visit: Payer: Medicare Other

## 2015-08-05 ENCOUNTER — Ambulatory Visit: Payer: Medicare Other

## 2015-08-06 ENCOUNTER — Ambulatory Visit (HOSPITAL_BASED_OUTPATIENT_CLINIC_OR_DEPARTMENT_OTHER): Payer: Medicare Other

## 2015-08-06 ENCOUNTER — Telehealth: Payer: Self-pay | Admitting: *Deleted

## 2015-08-06 ENCOUNTER — Other Ambulatory Visit (HOSPITAL_BASED_OUTPATIENT_CLINIC_OR_DEPARTMENT_OTHER): Payer: Medicare Other

## 2015-08-06 ENCOUNTER — Other Ambulatory Visit: Payer: Self-pay | Admitting: Family

## 2015-08-06 VITALS — BP 92/53 | HR 78 | Temp 97.9°F | Resp 20 | Wt 248.5 lb

## 2015-08-06 DIAGNOSIS — C50911 Malignant neoplasm of unspecified site of right female breast: Secondary | ICD-10-CM

## 2015-08-06 DIAGNOSIS — C50912 Malignant neoplasm of unspecified site of left female breast: Secondary | ICD-10-CM

## 2015-08-06 DIAGNOSIS — Z5111 Encounter for antineoplastic chemotherapy: Secondary | ICD-10-CM | POA: Diagnosis present

## 2015-08-06 DIAGNOSIS — D500 Iron deficiency anemia secondary to blood loss (chronic): Secondary | ICD-10-CM

## 2015-08-06 DIAGNOSIS — C50919 Malignant neoplasm of unspecified site of unspecified female breast: Secondary | ICD-10-CM | POA: Diagnosis present

## 2015-08-06 DIAGNOSIS — C787 Secondary malignant neoplasm of liver and intrahepatic bile duct: Principal | ICD-10-CM

## 2015-08-06 DIAGNOSIS — D510 Vitamin B12 deficiency anemia due to intrinsic factor deficiency: Secondary | ICD-10-CM

## 2015-08-06 DIAGNOSIS — K909 Intestinal malabsorption, unspecified: Secondary | ICD-10-CM

## 2015-08-06 DIAGNOSIS — G8929 Other chronic pain: Secondary | ICD-10-CM

## 2015-08-06 DIAGNOSIS — I150 Renovascular hypertension: Secondary | ICD-10-CM

## 2015-08-06 DIAGNOSIS — M545 Low back pain: Secondary | ICD-10-CM

## 2015-08-06 DIAGNOSIS — C772 Secondary and unspecified malignant neoplasm of intra-abdominal lymph nodes: Secondary | ICD-10-CM | POA: Diagnosis not present

## 2015-08-06 LAB — CBC WITH DIFFERENTIAL (CANCER CENTER ONLY)
BASO#: 0 10*3/uL (ref 0.0–0.2)
BASO%: 0.9 % (ref 0.0–2.0)
EOS%: 7 % (ref 0.0–7.0)
Eosinophils Absolute: 0.3 10*3/uL (ref 0.0–0.5)
HEMATOCRIT: 29.4 % — AB (ref 34.8–46.6)
HEMOGLOBIN: 9.5 g/dL — AB (ref 11.6–15.9)
LYMPH#: 1.3 10*3/uL (ref 0.9–3.3)
LYMPH%: 29.8 % (ref 14.0–48.0)
MCH: 34.2 pg — ABNORMAL HIGH (ref 26.0–34.0)
MCHC: 32.3 g/dL (ref 32.0–36.0)
MCV: 106 fL — ABNORMAL HIGH (ref 81–101)
MONO#: 0.3 10*3/uL (ref 0.1–0.9)
MONO%: 5.8 % (ref 0.0–13.0)
NEUT%: 56.5 % (ref 39.6–80.0)
NEUTROS ABS: 2.5 10*3/uL (ref 1.5–6.5)
Platelets: 147 10*3/uL (ref 145–400)
RBC: 2.78 10*6/uL — AB (ref 3.70–5.32)
RDW: 16.4 % — AB (ref 11.1–15.7)
WBC: 4.5 10*3/uL (ref 3.9–10.0)

## 2015-08-06 LAB — CMP (CANCER CENTER ONLY)
ALT: 37 U/L (ref 10–47)
AST: 25 U/L (ref 11–38)
Albumin: 2.8 g/dL — ABNORMAL LOW (ref 3.3–5.5)
Alkaline Phosphatase: 46 U/L (ref 26–84)
BUN: 22 mg/dL (ref 7–22)
CALCIUM: 6.1 mg/dL — AB (ref 8.0–10.3)
CHLORIDE: 111 meq/L — AB (ref 98–108)
CO2: 23 mEq/L (ref 18–33)
Creat: 1.1 mg/dl (ref 0.6–1.2)
Glucose, Bld: 84 mg/dL (ref 73–118)
POTASSIUM: 4.2 meq/L (ref 3.3–4.7)
Sodium: 140 mEq/L (ref 128–145)
TOTAL PROTEIN: 5.1 g/dL — AB (ref 6.4–8.1)
Total Bilirubin: 0.5 mg/dl (ref 0.20–1.60)

## 2015-08-06 LAB — TECHNOLOGIST REVIEW CHCC SATELLITE

## 2015-08-06 MED ORDER — PALONOSETRON HCL INJECTION 0.25 MG/5ML
INTRAVENOUS | Status: AC
  Filled 2015-08-06: qty 5

## 2015-08-06 MED ORDER — SODIUM CHLORIDE 0.9 % IV SOLN
Freq: Once | INTRAVENOUS | Status: AC
  Administered 2015-08-06: 11:00:00 via INTRAVENOUS
  Filled 2015-08-06: qty 5

## 2015-08-06 MED ORDER — FENTANYL 25 MCG/HR TD PT72
25.0000 ug | MEDICATED_PATCH | TRANSDERMAL | Status: DC
Start: 2015-08-06 — End: 2015-08-26

## 2015-08-06 MED ORDER — HEPARIN SOD (PORK) LOCK FLUSH 100 UNIT/ML IV SOLN
500.0000 [IU] | Freq: Once | INTRAVENOUS | Status: AC | PRN
  Administered 2015-08-06: 500 [IU]
  Filled 2015-08-06: qty 5

## 2015-08-06 MED ORDER — HYDROMORPHONE HCL 1 MG/ML IJ SOLN
INTRAMUSCULAR | Status: AC
  Filled 2015-08-06: qty 2

## 2015-08-06 MED ORDER — HYDROMORPHONE HCL 4 MG/ML IJ SOLN
4.0000 mg | Freq: Once | INTRAMUSCULAR | Status: AC
  Administered 2015-08-06: 2 mg via INTRAVENOUS

## 2015-08-06 MED ORDER — PACLITAXEL PROTEIN-BOUND CHEMO INJECTION 100 MG
100.0000 mg/m2 | Freq: Once | INTRAVENOUS | Status: AC
  Administered 2015-08-06: 225 mg via INTRAVENOUS
  Filled 2015-08-06: qty 45

## 2015-08-06 MED ORDER — PALONOSETRON HCL INJECTION 0.25 MG/5ML
0.2500 mg | Freq: Once | INTRAVENOUS | Status: AC
  Administered 2015-08-06: 0.25 mg via INTRAVENOUS

## 2015-08-06 MED ORDER — SODIUM CHLORIDE 0.9 % IV SOLN
Freq: Once | INTRAVENOUS | Status: AC
  Administered 2015-08-06: 10:00:00 via INTRAVENOUS

## 2015-08-06 MED ORDER — SODIUM CHLORIDE 0.9 % IJ SOLN
10.0000 mL | INTRAMUSCULAR | Status: DC | PRN
  Administered 2015-08-06: 10 mL
  Filled 2015-08-06: qty 10

## 2015-08-06 MED ORDER — FENTANYL 50 MCG/HR TD PT72: 100.0000 ug | MEDICATED_PATCH | TRANSDERMAL | Status: DC

## 2015-08-06 NOTE — Telephone Encounter (Signed)
Critical Value Calcium 6.1 Dr Marin Olp notified. No orders at this time

## 2015-08-06 NOTE — Patient Instructions (Signed)
[facility] Discharge Instructions for Patients Receiving Chemotherapy  Today you received the following chemotherapy agents Abraxane  To help prevent nausea and vomiting after your treatment, we encourage you to take your nausea medication    If you develop nausea and vomiting that is not controlled by your nausea medication, call the clinic.   BELOW ARE SYMPTOMS THAT SHOULD BE REPORTED IMMEDIATELY:  *FEVER GREATER THAN 100.5 F  *CHILLS WITH OR WITHOUT FEVER  NAUSEA AND VOMITING THAT IS NOT CONTROLLED WITH YOUR NAUSEA MEDICATION  *UNUSUAL SHORTNESS OF BREATH  *UNUSUAL BRUISING OR BLEEDING  TENDERNESS IN MOUTH AND THROAT WITH OR WITHOUT PRESENCE OF ULCERS  *URINARY PROBLEMS  *BOWEL PROBLEMS  UNUSUAL RASH Items with * indicate a potential emergency and should be followed up as soon as possible.  Feel free to call the clinic you have any questions or concerns. The clinic phone number is [phone].  Please show the CHEMO ALERT CARD at check-in to the Emergency Department and triage nurse.   

## 2015-08-10 ENCOUNTER — Ambulatory Visit (HOSPITAL_COMMUNITY): Payer: Medicare Other

## 2015-08-10 ENCOUNTER — Encounter: Payer: Self-pay | Admitting: Nurse Practitioner

## 2015-08-10 NOTE — Progress Notes (Signed)
Received notification from pharmacy and also [facility] stating patient has been receiving pain medications from multiple providers. At this point Dr. Marin Olp and Judson Roch, NP is aware of the situation. Dr. Nicholaus Bloom at the Pain center stated he is discontinuing all pain medications as ordered by his practice and will no longer manage pain symptoms.

## 2015-08-13 ENCOUNTER — Encounter: Payer: Self-pay | Admitting: Nurse Practitioner

## 2015-08-13 NOTE — Progress Notes (Signed)
PET SCAN: Approved following peer to peer Case # [id number] Approval # [id number] per [employer].

## 2015-08-17 ENCOUNTER — Ambulatory Visit (HOSPITAL_COMMUNITY)
Admission: RE | Admit: 2015-08-17 | Discharge: 2015-08-17 | Disposition: A | Discharge status: Home / Self Care | Payer: Medicare Other | Source: Ambulatory Visit | Attending: Hematology & Oncology | Admitting: Hematology & Oncology

## 2015-08-17 ENCOUNTER — Other Ambulatory Visit: Payer: Self-pay | Admitting: Hematology & Oncology

## 2015-08-17 ENCOUNTER — Other Ambulatory Visit: Payer: Self-pay | Admitting: Family

## 2015-08-17 DIAGNOSIS — C787 Secondary malignant neoplasm of liver and intrahepatic bile duct: Secondary | ICD-10-CM | POA: Insufficient documentation

## 2015-08-17 DIAGNOSIS — C50919 Malignant neoplasm of unspecified site of unspecified female breast: Secondary | ICD-10-CM | POA: Diagnosis not present

## 2015-08-17 DIAGNOSIS — R590 Localized enlarged lymph nodes: Secondary | ICD-10-CM | POA: Insufficient documentation

## 2015-08-17 DIAGNOSIS — D500 Iron deficiency anemia secondary to blood loss (chronic): Secondary | ICD-10-CM | POA: Insufficient documentation

## 2015-08-17 DIAGNOSIS — R938 Abnormal findings on diagnostic imaging of other specified body structures: Secondary | ICD-10-CM | POA: Insufficient documentation

## 2015-08-17 DIAGNOSIS — D510 Vitamin B12 deficiency anemia due to intrinsic factor deficiency: Secondary | ICD-10-CM | POA: Insufficient documentation

## 2015-08-17 DIAGNOSIS — K909 Intestinal malabsorption, unspecified: Secondary | ICD-10-CM | POA: Diagnosis not present

## 2015-08-17 DIAGNOSIS — C50912 Malignant neoplasm of unspecified site of left female breast: Secondary | ICD-10-CM | POA: Insufficient documentation

## 2015-08-17 LAB — GLUCOSE, CAPILLARY: GLUCOSE-CAPILLARY: 79 mg/dL (ref 65–99)

## 2015-08-17 MED ORDER — FLUDEOXYGLUCOSE F - 18 (FDG) INJECTION: 12.8000 | Freq: Once | INTRAVENOUS | Status: DC | PRN

## 2015-08-18 ENCOUNTER — Other Ambulatory Visit: Payer: Self-pay | Admitting: *Deleted

## 2015-08-18 DIAGNOSIS — C787 Secondary malignant neoplasm of liver and intrahepatic bile duct: Principal | ICD-10-CM

## 2015-08-18 DIAGNOSIS — C50919 Malignant neoplasm of unspecified site of unspecified female breast: Secondary | ICD-10-CM

## 2015-08-19 ENCOUNTER — Other Ambulatory Visit (HOSPITAL_BASED_OUTPATIENT_CLINIC_OR_DEPARTMENT_OTHER): Payer: Medicare Other

## 2015-08-19 ENCOUNTER — Ambulatory Visit (HOSPITAL_BASED_OUTPATIENT_CLINIC_OR_DEPARTMENT_OTHER): Payer: Medicare Other | Admitting: Hematology & Oncology

## 2015-08-19 ENCOUNTER — Encounter: Payer: Self-pay | Admitting: Hematology & Oncology

## 2015-08-19 ENCOUNTER — Telehealth: Payer: Self-pay | Admitting: *Deleted

## 2015-08-19 ENCOUNTER — Ambulatory Visit (HOSPITAL_BASED_OUTPATIENT_CLINIC_OR_DEPARTMENT_OTHER): Payer: Medicare Other

## 2015-08-19 VITALS — BP 89/48 | HR 66 | Temp 98.1°F | Resp 22 | Wt 248.0 lb

## 2015-08-19 DIAGNOSIS — C787 Secondary malignant neoplasm of liver and intrahepatic bile duct: Principal | ICD-10-CM

## 2015-08-19 DIAGNOSIS — D510 Vitamin B12 deficiency anemia due to intrinsic factor deficiency: Secondary | ICD-10-CM

## 2015-08-19 DIAGNOSIS — C772 Secondary and unspecified malignant neoplasm of intra-abdominal lymph nodes: Secondary | ICD-10-CM

## 2015-08-19 DIAGNOSIS — C50919 Malignant neoplasm of unspecified site of unspecified female breast: Secondary | ICD-10-CM | POA: Diagnosis not present

## 2015-08-19 DIAGNOSIS — D500 Iron deficiency anemia secondary to blood loss (chronic): Secondary | ICD-10-CM | POA: Diagnosis not present

## 2015-08-19 DIAGNOSIS — Z5111 Encounter for antineoplastic chemotherapy: Secondary | ICD-10-CM

## 2015-08-19 DIAGNOSIS — C50912 Malignant neoplasm of unspecified site of left female breast: Secondary | ICD-10-CM

## 2015-08-19 LAB — CMP (CANCER CENTER ONLY)
ALK PHOS: 56 U/L (ref 26–84)
ALT(SGPT): 31 U/L (ref 10–47)
AST: 29 U/L (ref 11–38)
Albumin: 2.6 g/dL — ABNORMAL LOW (ref 3.3–5.5)
BUN, Bld: 18 mg/dL (ref 7–22)
CALCIUM: 5.3 mg/dL — AB (ref 8.0–10.3)
CO2: 25 mEq/L (ref 18–33)
Chloride: 104 mEq/L (ref 98–108)
Creat: 1.4 mg/dl — ABNORMAL HIGH (ref 0.6–1.2)
GLUCOSE: 91 mg/dL (ref 73–118)
POTASSIUM: 4.4 meq/L (ref 3.3–4.7)
Sodium: 137 mEq/L (ref 128–145)
TOTAL PROTEIN: 5.5 g/dL — AB (ref 6.4–8.1)
Total Bilirubin: 0.6 mg/dl (ref 0.20–1.60)

## 2015-08-19 LAB — CBC WITH DIFFERENTIAL (CANCER CENTER ONLY)
BASO#: 0.1 10*3/uL (ref 0.0–0.2)
BASO%: 1.2 % (ref 0.0–2.0)
EOS ABS: 0.2 10*3/uL (ref 0.0–0.5)
EOS%: 3.6 % (ref 0.0–7.0)
HEMATOCRIT: 30.5 % — AB (ref 34.8–46.6)
HGB: 9.9 g/dL — ABNORMAL LOW (ref 11.6–15.9)
LYMPH#: 1.6 10*3/uL (ref 0.9–3.3)
LYMPH%: 31.9 % (ref 14.0–48.0)
MCH: 34.6 pg — AB (ref 26.0–34.0)
MCHC: 32.5 g/dL (ref 32.0–36.0)
MCV: 107 fL — AB (ref 81–101)
MONO#: 0.5 10*3/uL (ref 0.1–0.9)
MONO%: 10.5 % (ref 0.0–13.0)
NEUT#: 2.7 10*3/uL (ref 1.5–6.5)
NEUT%: 52.8 % (ref 39.6–80.0)
Platelets: 239 10*3/uL (ref 145–400)
RBC: 2.86 10*6/uL — ABNORMAL LOW (ref 3.70–5.32)
RDW: 15.1 % (ref 11.1–15.7)
WBC: 5.1 10*3/uL (ref 3.9–10.0)

## 2015-08-19 MED ORDER — SODIUM CHLORIDE 0.9 % IV SOLN
Freq: Once | INTRAVENOUS | Status: AC
  Administered 2015-08-19: 11:00:00 via INTRAVENOUS

## 2015-08-19 MED ORDER — SODIUM CHLORIDE 0.9 % IV SOLN
Freq: Once | INTRAVENOUS | Status: AC
  Administered 2015-08-19: 11:00:00 via INTRAVENOUS
  Filled 2015-08-19: qty 5

## 2015-08-19 MED ORDER — SODIUM CHLORIDE 0.9 % IJ SOLN
10.0000 mL | INTRAMUSCULAR | Status: DC | PRN
  Administered 2015-08-19: 10 mL
  Filled 2015-08-19: qty 10

## 2015-08-19 MED ORDER — HEPARIN SOD (PORK) LOCK FLUSH 100 UNIT/ML IV SOLN
500.0000 [IU] | Freq: Once | INTRAVENOUS | Status: AC | PRN
  Administered 2015-08-19: 500 [IU]
  Filled 2015-08-19: qty 5

## 2015-08-19 MED ORDER — HYDROMORPHONE HCL 1 MG/ML IJ SOLN
INTRAMUSCULAR | Status: AC
  Filled 2015-08-19: qty 2

## 2015-08-19 MED ORDER — PALONOSETRON HCL INJECTION 0.25 MG/5ML
0.2500 mg | Freq: Once | INTRAVENOUS | Status: AC
  Administered 2015-08-19: 0.25 mg via INTRAVENOUS

## 2015-08-19 MED ORDER — PACLITAXEL PROTEIN-BOUND CHEMO INJECTION 100 MG
100.0000 mg/m2 | Freq: Once | INTRAVENOUS | Status: AC
  Administered 2015-08-19: 225 mg via INTRAVENOUS
  Filled 2015-08-19: qty 45

## 2015-08-19 MED ORDER — HYDROMORPHONE HCL 1 MG/ML IJ SOLN
2.0000 mg | INTRAMUSCULAR | Status: DC | PRN
Start: 2015-08-19 — End: 2015-08-19
  Administered 2015-08-19: 2 mg via INTRAVENOUS

## 2015-08-19 MED ORDER — PALONOSETRON HCL INJECTION 0.25 MG/5ML
INTRAVENOUS | Status: AC
  Filled 2015-08-19: qty 5

## 2015-08-19 NOTE — Telephone Encounter (Signed)
Critical Value Calcium 5.3 Dr Marin Olp aware. No new orders

## 2015-08-19 NOTE — Progress Notes (Signed)
Hematology and Oncology Follow Up Visit  Kerri Vasquez [id number] 05/06/1955 60 y.o. 08/19/2015   Principle Diagnosis:  1. Metastatic breast cancer, progressive liver metastases  2. Gastric bypass for subsequent B12 and iron deficiency 3. Osteoarthritis with chronic pain 4. 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 Abraxane q 21 days s/p cycle 5 2. Vitamin B12 1 mg IM monthly 3. IV iron as indicated    Interim History:  [name] is here today.  She is holding her own. She is really bothered by back pain which is not from cancer but from severe arthritis. She has knee pain from arthritis. She is not a surgical candidate for any intervention for these problems.  He did go ahead and repeat a PET scan on her. The PET scan showed slight progression of her breast cancer area and no new disease was noted. The liver met that she has was slightly larger. It measures 4.4 x 2.9 cm. There was indeterminate periportal lymph node and peritoneal thickening. No ascites was noted.  I think she is tolerated chemotherapy pretty well.  She will does not do all that much. She has had the gastric bypass and subsequent malabsorption of iron and B-12.  Overall, her performance status is ECOG 2.  Medications:    Medication List       This list is accurate as of: 08/19/15  6:36 PM.  Always use your most recent med list.               carvedilol 6.25 MG tablet  Commonly known as:  COREG  Take 1 tablet (6.25 mg total) by mouth 2 (two) times daily with a meal.     cyanocobalamin 1000 MCG/ML injection  Commonly known as:  (VITAMIN B-12)  INJECT 1 ML AS DIRECTED EVERY 21 DAYS     dexamethasone 4 MG tablet  Commonly known as:  DECADRON  TAKE 2 TABLETS BY MOUTH TWICE DAILY WITH A MEAL STARTING THE DAY AFTER CHEMOTHERAPY FOR 2 DAYS     fentaNYL 25 MCG/HR patch  Commonly known as:  DURAGESIC - dosed mcg/hr  Place 1 patch (25 mcg total) onto the skin every other  day.     fentaNYL 50 MCG/HR  Commonly known as:  DURAGESIC - dosed mcg/hr  Place 2 patches (100 mcg total) onto the skin every other day.     FLUoxetine 40 MG capsule  Commonly known as:  PROZAC  Take 40 mg by mouth daily before breakfast.     furosemide 20 MG tablet  Commonly known as:  LASIX  Take 60 mg by mouth daily.     levothyroxine 150 MCG tablet  Commonly known as:  SYNTHROID, LEVOTHROID  Take 1 tablet (150 mcg total) by mouth daily.     lidocaine-prilocaine cream  Commonly known as:  EMLA  Apply to affected area once     LORazepam 0.5 MG tablet  Commonly known as:  ATIVAN  PLACE 1 TABLET UNDER TONGUE EVERY 6 HOURS AS NEEDED FOR NAUSEA AND VOMITING     LYRICA 200 MG capsule  Generic drug:  pregabalin  TAKE 1 CAPSULE 2 TIMES A DAY     methocarbamol 750 MG tablet  Commonly known as:  ROBAXIN  Take 750 mg by mouth 4 (four) times daily.     MULTI-VITAMINS Tabs  Take 1 tablet by mouth daily.     nitroGLYCERIN 0.4 MG SL tablet  Commonly known as:  NITROSTAT  Place 1 tablet (0.4 mg total) under the tongue every 5 (five) minutes as needed for chest pain.     ondansetron 8 MG tablet  Commonly known as:  ZOFRAN  Take 1 tablet (8 mg total) by mouth every 8 (eight) hours as needed for nausea or vomiting.     ondansetron 8 MG tablet  Commonly known as:  ZOFRAN  TAKE 1 TABLET BY MOUTH EVERY 8 HOURS AS NEEDED FOR NAUSEA AND VOMITING     oxycodone 5 MG capsule  Commonly known as:  OXY-IR  Take 2-4 capsules (10-20 mg total) by mouth every 4 (four) hours.     pantoprazole 40 MG tablet  Commonly known as:  PROTONIX  Take 1 tablet (40 mg total) by mouth 2 (two) times daily.     polyethylene glycol packet  Commonly known as:  MIRALAX / GLYCOLAX  Take 17 g by mouth 2 (two) times daily.     potassium chloride SA 20 MEQ tablet  Commonly known as:  KLOR-CON M20  Take 2 tablets (40 mEq total) by mouth 2 (two) times daily.     prochlorperazine 10 MG tablet  Commonly  known as:  COMPAZINE  TAKE 1 TABLET BY MOUTH EVERY 6 HOURS AS NEEDED FOR NAUSEA AND VOMITING     promethazine 25 MG tablet  Commonly known as:  PHENERGAN  TAKE 1 TABLET BY MOUTH THREE TIMES DAILY AS NEEDED FOR NAUSEA.     temazepam 15 MG capsule  Commonly known as:  RESTORIL  Take 30 mg by mouth at bedtime.     tiZANidine 4 MG tablet  Commonly known as:  ZANAFLEX  Take 4 mg by mouth every 6 (six) hours as needed for muscle spasms.        Allergies:  Allergies  Allergen Reactions  . Influenza Vac Split [Flu Virus Vaccine] Other (See Comments)    Joint stiffness and renal failure  . Ritalin [Methylphenidate Hcl]     "Heart attack"  . Zostavax [Zoster Vaccine Live ([employer])] Other (See Comments)    Joint stiffness, renal failure  . Adhesive [Tape] Itching and Rash    Past Medical History, Surgical history, Social history, and Family History were reviewed and updated.  Review of Systems: All other 10 point review of systems is negative.   Physical Exam:  weight is 248 lb (112.492 kg). Her oral temperature is 98.1 F (36.7 C). Her blood pressure is 89/48 and her pulse is 66. Her respiration is 22.   Wt Readings from Last 3 Encounters:  08/19/15 248 lb (112.492 kg)  08/06/15 248 lb 8 oz (112.719 kg)  07/29/15 237 lb (107.502 kg)    Obese white female in no obvious distress. Head and neck exam shows no ocular or oral lesions. She has no scleral icterus. She has no adenopathy in the neck. She has no palpable thyroid. Lungs are with decreased at the bases. Cardiac exam regular rate and rhythm with no murmurs, rubs or bruits. Abdomen is obese but soft. She has good bowel sounds. She'll a little bit of a abdominal wall hernia. This is chronic. This reduces easily. There is no palpable hepatomegaly. Extremities shows chronic nonpitting edema of the lower legs. She has decent range of motion of her joints. Skin exam shows no rashes. Neurological exam shows no focal neurological  deficits. .  Lab Results  Component Value Date   WBC 5.1 08/19/2015   HGB 9.9* 08/19/2015   HCT 30.5* 08/19/2015   MCV 107* 08/19/2015  PLT 239 08/19/2015   Lab Results  Component Value Date   FERRITIN 839* 07/15/2015   IRON 43 07/15/2015   TIBC 176* 07/15/2015   UIBC 133 07/15/2015   IRONPCTSAT 24 07/15/2015   Lab Results  Component Value Date   RETICCTPCT 1.0 06/29/2014   RBC 2.86* 08/19/2015   RETICCTABS 32.3 06/29/2014   No results found for: KPAFRELGTCHN, LAMBDASER, KAPLAMBRATIO No results found for: Osborne Casco Lab Results  Component Value Date   TOTALPROTELP 4.7* 01/31/2010   ALBUMINELP 39.4* 01/31/2010   A1GS 12.1* 01/31/2010   A2GS 20.3* 01/31/2010   BETS 6.0 01/31/2010   BETA2SER 6.8* 01/31/2010   GAMS 15.4 01/31/2010   MSPIKE NOT DETECTED 01/31/2010   SPEI  01/31/2010    (NOTE) The possibility of a faint restricted band(s) cannot be completely excluded in the gamma region.  Suggest serum IFE to evaluate possibility, if clinically indicated. (Lab will hold sample one week. Please call Customer Service at [phone] to  add test.) Reviewed by Odis Hollingshead, MD, PhD, FCAP (Electronic Signature on File)     Chemistry      Component Value Date/Time   NA 137 08/19/2015 0915   NA 143 04/21/2015 0530   NA 141 03/18/2015 0918   K 4.4 08/19/2015 0915   K 3.3* 04/21/2015 0530   K 3.8 03/18/2015 0918   CL 104 08/19/2015 0915   CL 111 04/21/2015 0530   CO2 25 08/19/2015 0915   CO2 24 04/21/2015 0530   CO2 26 03/18/2015 0918   BUN 18 08/19/2015 0915   BUN 10 04/21/2015 0530   BUN 12.5 03/18/2015 0918   CREATININE 1.4* 08/19/2015 0915   CREATININE 1.15* 04/21/2015 0530   CREATININE 1.2* 03/18/2015 0918      Component Value Date/Time   CALCIUM 5.3* 08/19/2015 0915   CALCIUM 7.9* 04/21/2015 0530   CALCIUM 9.2 03/18/2015 0918   ALKPHOS 56 08/19/2015 0915   ALKPHOS 60 04/21/2015 0530   ALKPHOS 74 03/18/2015 0918   AST 29 08/19/2015  0915   AST 17 04/21/2015 0530   AST 21 03/18/2015 0918   ALT 31 08/19/2015 0915   ALT 12* 04/21/2015 0530   ALT 11 03/18/2015 0918   BILITOT 0.60 08/19/2015 0915   BILITOT 0.8 04/21/2015 0530   BILITOT 0.57 03/18/2015 0918     Impression and Plan: [name] is 60 year old white female with metastatic breast cancer.   From my point of view, I think that she really is doing well. I just don't think that we should change her therapy right now. I think anything additional therapy would have more toxicity and would affect her quality of life.  Of note, her last CA-27-29 was holding pretty steady at 66.  I will like to see her back in 3 weeks.  Her birthday is coming up in middle of May.  I spent about 30 minutes with her today.  Volanda Napoleon, MD 4/20/20176:36 PM

## 2015-08-19 NOTE — Patient Instructions (Signed)
[facility] Discharge Instructions for Patients Receiving Chemotherapy  Today you received the following chemotherapy agents Abraxane  To help prevent nausea and vomiting after your treatment, we encourage you to take your nausea medication    If you develop nausea and vomiting that is not controlled by your nausea medication, call the clinic.   BELOW ARE SYMPTOMS THAT SHOULD BE REPORTED IMMEDIATELY:  *FEVER GREATER THAN 100.5 F  *CHILLS WITH OR WITHOUT FEVER  NAUSEA AND VOMITING THAT IS NOT CONTROLLED WITH YOUR NAUSEA MEDICATION  *UNUSUAL SHORTNESS OF BREATH  *UNUSUAL BRUISING OR BLEEDING  TENDERNESS IN MOUTH AND THROAT WITH OR WITHOUT PRESENCE OF ULCERS  *URINARY PROBLEMS  *BOWEL PROBLEMS  UNUSUAL RASH Items with * indicate a potential emergency and should be followed up as soon as possible.  Feel free to call the clinic you have any questions or concerns. The clinic phone number is [phone].  Please show the CHEMO ALERT CARD at check-in to the Emergency Department and triage nurse.   

## 2015-08-22 ENCOUNTER — Encounter: Payer: Self-pay | Admitting: Hematology & Oncology

## 2015-08-26 ENCOUNTER — Other Ambulatory Visit (HOSPITAL_BASED_OUTPATIENT_CLINIC_OR_DEPARTMENT_OTHER): Payer: Medicare Other

## 2015-08-26 ENCOUNTER — Telehealth: Payer: Self-pay | Admitting: *Deleted

## 2015-08-26 ENCOUNTER — Ambulatory Visit (HOSPITAL_BASED_OUTPATIENT_CLINIC_OR_DEPARTMENT_OTHER): Payer: Medicare Other

## 2015-08-26 ENCOUNTER — Other Ambulatory Visit: Payer: Self-pay | Admitting: Nurse Practitioner

## 2015-08-26 ENCOUNTER — Other Ambulatory Visit: Payer: Self-pay | Admitting: *Deleted

## 2015-08-26 VITALS — BP 88/54 | HR 84 | Temp 97.7°F | Resp 20 | Wt 250.0 lb

## 2015-08-26 DIAGNOSIS — D510 Vitamin B12 deficiency anemia due to intrinsic factor deficiency: Secondary | ICD-10-CM

## 2015-08-26 DIAGNOSIS — C50911 Malignant neoplasm of unspecified site of right female breast: Secondary | ICD-10-CM

## 2015-08-26 DIAGNOSIS — I150 Renovascular hypertension: Secondary | ICD-10-CM

## 2015-08-26 DIAGNOSIS — D500 Iron deficiency anemia secondary to blood loss (chronic): Secondary | ICD-10-CM

## 2015-08-26 DIAGNOSIS — M545 Low back pain, unspecified: Secondary | ICD-10-CM

## 2015-08-26 DIAGNOSIS — G8929 Other chronic pain: Secondary | ICD-10-CM

## 2015-08-26 DIAGNOSIS — C50919 Malignant neoplasm of unspecified site of unspecified female breast: Secondary | ICD-10-CM

## 2015-08-26 DIAGNOSIS — Z5111 Encounter for antineoplastic chemotherapy: Secondary | ICD-10-CM

## 2015-08-26 DIAGNOSIS — C50912 Malignant neoplasm of unspecified site of left female breast: Secondary | ICD-10-CM

## 2015-08-26 DIAGNOSIS — C787 Secondary malignant neoplasm of liver and intrahepatic bile duct: Secondary | ICD-10-CM | POA: Diagnosis not present

## 2015-08-26 DIAGNOSIS — C772 Secondary and unspecified malignant neoplasm of intra-abdominal lymph nodes: Secondary | ICD-10-CM

## 2015-08-26 LAB — CMP (CANCER CENTER ONLY)
ALK PHOS: 55 U/L (ref 26–84)
ALT: 28 U/L (ref 10–47)
AST: 28 U/L (ref 11–38)
Albumin: 2.8 g/dL — ABNORMAL LOW (ref 3.3–5.5)
BILIRUBIN TOTAL: 0.6 mg/dL (ref 0.20–1.60)
BUN: 20 mg/dL (ref 7–22)
CO2: 23 mEq/L (ref 18–33)
Calcium: 6.2 mg/dL — CL (ref 8.0–10.3)
Chloride: 106 mEq/L (ref 98–108)
Creat: 1.5 mg/dl — ABNORMAL HIGH (ref 0.6–1.2)
GLUCOSE: 142 mg/dL — AB (ref 73–118)
Potassium: 3.8 mEq/L (ref 3.3–4.7)
Sodium: 140 mEq/L (ref 128–145)
TOTAL PROTEIN: 5.8 g/dL — AB (ref 6.4–8.1)

## 2015-08-26 LAB — TECHNOLOGIST REVIEW CHCC SATELLITE

## 2015-08-26 LAB — CBC WITH DIFFERENTIAL (CANCER CENTER ONLY)
BASO#: 0 10*3/uL (ref 0.0–0.2)
BASO%: 0.4 % (ref 0.0–2.0)
EOS ABS: 0.1 10*3/uL (ref 0.0–0.5)
EOS%: 1.6 % (ref 0.0–7.0)
HEMATOCRIT: 31 % — AB (ref 34.8–46.6)
HGB: 10.3 g/dL — ABNORMAL LOW (ref 11.6–15.9)
LYMPH#: 1.8 10*3/uL (ref 0.9–3.3)
LYMPH%: 31.9 % (ref 14.0–48.0)
MCH: 34.8 pg — ABNORMAL HIGH (ref 26.0–34.0)
MCHC: 33.2 g/dL (ref 32.0–36.0)
MCV: 105 fL — ABNORMAL HIGH (ref 81–101)
MONO#: 0.2 10*3/uL (ref 0.1–0.9)
MONO%: 3 % (ref 0.0–13.0)
NEUT%: 63.1 % (ref 39.6–80.0)
NEUTROS ABS: 3.6 10*3/uL (ref 1.5–6.5)
PLATELETS: 207 10*3/uL (ref 145–400)
RBC: 2.96 10*6/uL — AB (ref 3.70–5.32)
RDW: 14 % (ref 11.1–15.7)
WBC: 5.7 10*3/uL (ref 3.9–10.0)

## 2015-08-26 MED ORDER — HYDROMORPHONE HCL 4 MG/ML IJ SOLN
INTRAMUSCULAR | Status: AC
  Filled 2015-08-26: qty 1

## 2015-08-26 MED ORDER — PALONOSETRON HCL INJECTION 0.25 MG/5ML
0.2500 mg | Freq: Once | INTRAVENOUS | Status: AC
  Administered 2015-08-26: 0.25 mg via INTRAVENOUS

## 2015-08-26 MED ORDER — OXYCODONE HCL 15 MG PO TABS: 15.0000 mg | ORAL_TABLET | ORAL | Status: DC | PRN

## 2015-08-26 MED ORDER — CYANOCOBALAMIN 1000 MCG/ML IJ SOLN
INTRAMUSCULAR | Status: AC
  Filled 2015-08-26: qty 1

## 2015-08-26 MED ORDER — CYANOCOBALAMIN 1000 MCG/ML IJ SOLN
1000.0000 ug | Freq: Once | INTRAMUSCULAR | Status: AC
  Administered 2015-08-26: 1000 ug via INTRAMUSCULAR

## 2015-08-26 MED ORDER — SODIUM CHLORIDE 0.9 % IJ SOLN
10.0000 mL | INTRAMUSCULAR | Status: DC | PRN
  Administered 2015-08-26: 10 mL
  Filled 2015-08-26: qty 10

## 2015-08-26 MED ORDER — HEPARIN SOD (PORK) LOCK FLUSH 100 UNIT/ML IV SOLN
500.0000 [IU] | Freq: Once | INTRAVENOUS | Status: AC | PRN
  Administered 2015-08-26: 500 [IU]
  Filled 2015-08-26: qty 5

## 2015-08-26 MED ORDER — HYDROMORPHONE HCL 1 MG/ML IJ SOLN
4.0000 mg | Freq: Once | INTRAMUSCULAR | Status: AC
  Administered 2015-08-26: 4 mg via INTRAVENOUS

## 2015-08-26 MED ORDER — PACLITAXEL PROTEIN-BOUND CHEMO INJECTION 100 MG
100.0000 mg/m2 | Freq: Once | INTRAVENOUS | Status: AC
  Administered 2015-08-26: 225 mg via INTRAVENOUS
  Filled 2015-08-26: qty 45

## 2015-08-26 MED ORDER — PALONOSETRON HCL INJECTION 0.25 MG/5ML
INTRAVENOUS | Status: AC
  Filled 2015-08-26: qty 5

## 2015-08-26 MED ORDER — SODIUM CHLORIDE 0.9 % IV SOLN
Freq: Once | INTRAVENOUS | Status: AC
  Administered 2015-08-26: 11:00:00 via INTRAVENOUS
  Filled 2015-08-26: qty 5

## 2015-08-26 MED ORDER — OXYCODONE HCL 10 MG PO TABS: 10.0000 mg | ORAL_TABLET | ORAL | Status: DC | PRN

## 2015-08-26 MED ORDER — FENTANYL 50 MCG/HR TD PT72: 100.0000 ug | MEDICATED_PATCH | TRANSDERMAL | Status: DC

## 2015-08-26 MED ORDER — OXYCODONE-ACETAMINOPHEN 10-325 MG PO TABS: 1.0000 | ORAL_TABLET | ORAL | Status: DC | PRN

## 2015-08-26 MED ORDER — FENTANYL 25 MCG/HR TD PT72: 25.0000 ug | MEDICATED_PATCH | TRANSDERMAL | Status: DC

## 2015-08-26 MED ORDER — SODIUM CHLORIDE 0.9 % IV SOLN
Freq: Once | INTRAVENOUS | Status: AC
  Administered 2015-08-26: 11:00:00 via INTRAVENOUS

## 2015-08-26 MED FILL — oxyCODONE HCL 15 MG TABS: 15 | 30 days supply | Qty: 180 | Fill #0

## 2015-08-26 MED FILL — fentaNYL 25 MCG/HR PT72: 25 | 30 days supply | Qty: 15 | Fill #0

## 2015-08-26 MED FILL — fentaNYL 50 MCG/HR PT72: 50 | 30 days supply | Qty: 30 | Fill #0

## 2015-08-26 NOTE — Patient Instructions (Addendum)
[facility] Discharge Instructions for Patients Receiving Chemotherapy  Today you received the following chemotherapy agents Abraxane  To help prevent nausea and vomiting after your treatment, we encourage you to take your nausea medication    If you develop nausea and vomiting that is not controlled by your nausea medication, call the clinic.   BELOW ARE SYMPTOMS THAT SHOULD BE REPORTED IMMEDIATELY:  *FEVER GREATER THAN 100.5 F  *CHILLS WITH OR WITHOUT FEVER  NAUSEA AND VOMITING THAT IS NOT CONTROLLED WITH YOUR NAUSEA MEDICATION  *UNUSUAL SHORTNESS OF BREATH  *UNUSUAL BRUISING OR BLEEDING  TENDERNESS IN MOUTH AND THROAT WITH OR WITHOUT PRESENCE OF ULCERS  *URINARY PROBLEMS  *BOWEL PROBLEMS  UNUSUAL RASH Items with * indicate a potential emergency and should be followed up as soon as possible.  Feel free to call the clinic you have any questions or concerns. The clinic phone number is [phone].  Please show the [facility] at check-in to the Emergency Department and triage nurse.  [facility] Discharge Instructions for Patients Receiving Chemotherapy  Today you received the following chemotherapy agents Abraxane  To help prevent nausea and vomiting after your treatment, we encourage you to take your nausea medication    If you develop nausea and vomiting that is not controlled by your nausea medication, call the clinic.   BELOW ARE SYMPTOMS THAT SHOULD BE REPORTED IMMEDIATELY:  *FEVER GREATER THAN 100.5 F  *CHILLS WITH OR WITHOUT FEVER  NAUSEA AND VOMITING THAT IS NOT CONTROLLED WITH YOUR NAUSEA MEDICATION  *UNUSUAL SHORTNESS OF BREATH  *UNUSUAL BRUISING OR BLEEDING  TENDERNESS IN MOUTH AND THROAT WITH OR WITHOUT PRESENCE OF ULCERS  *URINARY PROBLEMS  *BOWEL PROBLEMS  UNUSUAL RASH Items with * indicate a potential emergency and should be followed up as soon as possible.  Feel free to call the clinic you have any  questions or concerns. The clinic phone number is [phone].  Please show the [facility] at check-in to the Emergency Department and triage nurse.

## 2015-08-26 NOTE — Telephone Encounter (Signed)
Critical Value Calcium 6.2 Dr Ennever notified. No orders at this time 

## 2015-09-06 ENCOUNTER — Other Ambulatory Visit: Payer: Self-pay | Admitting: Hematology & Oncology

## 2015-09-06 ENCOUNTER — Other Ambulatory Visit: Payer: Self-pay | Admitting: Family

## 2015-09-07 ENCOUNTER — Other Ambulatory Visit: Payer: Self-pay | Admitting: Hematology & Oncology

## 2015-09-08 ENCOUNTER — Other Ambulatory Visit: Payer: Self-pay | Admitting: *Deleted

## 2015-09-08 DIAGNOSIS — C50912 Malignant neoplasm of unspecified site of left female breast: Secondary | ICD-10-CM

## 2015-09-08 DIAGNOSIS — C787 Secondary malignant neoplasm of liver and intrahepatic bile duct: Principal | ICD-10-CM

## 2015-09-09 ENCOUNTER — Ambulatory Visit (HOSPITAL_BASED_OUTPATIENT_CLINIC_OR_DEPARTMENT_OTHER): Payer: Medicare Other

## 2015-09-09 ENCOUNTER — Other Ambulatory Visit (HOSPITAL_BASED_OUTPATIENT_CLINIC_OR_DEPARTMENT_OTHER): Payer: Medicare Other

## 2015-09-09 ENCOUNTER — Encounter: Payer: Self-pay | Admitting: Hematology & Oncology

## 2015-09-09 ENCOUNTER — Ambulatory Visit (HOSPITAL_BASED_OUTPATIENT_CLINIC_OR_DEPARTMENT_OTHER): Payer: Medicare Other | Admitting: Hematology & Oncology

## 2015-09-09 VITALS — BP 100/51 | HR 83 | Temp 97.6°F | Resp 16 | Ht 66.0 in | Wt 247.0 lb

## 2015-09-09 DIAGNOSIS — Z9884 Bariatric surgery status: Secondary | ICD-10-CM | POA: Diagnosis not present

## 2015-09-09 DIAGNOSIS — C50912 Malignant neoplasm of unspecified site of left female breast: Secondary | ICD-10-CM

## 2015-09-09 DIAGNOSIS — C787 Secondary malignant neoplasm of liver and intrahepatic bile duct: Secondary | ICD-10-CM

## 2015-09-09 DIAGNOSIS — M069 Rheumatoid arthritis, unspecified: Secondary | ICD-10-CM | POA: Diagnosis not present

## 2015-09-09 DIAGNOSIS — D509 Iron deficiency anemia, unspecified: Secondary | ICD-10-CM | POA: Diagnosis not present

## 2015-09-09 DIAGNOSIS — G4701 Insomnia due to medical condition: Secondary | ICD-10-CM | POA: Diagnosis not present

## 2015-09-09 DIAGNOSIS — C50919 Malignant neoplasm of unspecified site of unspecified female breast: Secondary | ICD-10-CM

## 2015-09-09 DIAGNOSIS — D510 Vitamin B12 deficiency anemia due to intrinsic factor deficiency: Secondary | ICD-10-CM

## 2015-09-09 DIAGNOSIS — E538 Deficiency of other specified B group vitamins: Secondary | ICD-10-CM

## 2015-09-09 DIAGNOSIS — Z5111 Encounter for antineoplastic chemotherapy: Secondary | ICD-10-CM | POA: Diagnosis present

## 2015-09-09 DIAGNOSIS — G441 Vascular headache, not elsewhere classified: Secondary | ICD-10-CM | POA: Diagnosis not present

## 2015-09-09 DIAGNOSIS — K909 Intestinal malabsorption, unspecified: Secondary | ICD-10-CM

## 2015-09-09 DIAGNOSIS — S025XXA Fracture of tooth (traumatic), initial encounter for closed fracture: Secondary | ICD-10-CM | POA: Diagnosis not present

## 2015-09-09 DIAGNOSIS — D500 Iron deficiency anemia secondary to blood loss (chronic): Secondary | ICD-10-CM

## 2015-09-09 LAB — CBC WITH DIFFERENTIAL (CANCER CENTER ONLY)
BASO#: 0 10*3/uL (ref 0.0–0.2)
BASO%: 0.5 % (ref 0.0–2.0)
EOS%: 0 % (ref 0.0–7.0)
Eosinophils Absolute: 0 10*3/uL (ref 0.0–0.5)
HEMATOCRIT: 29.7 % — AB (ref 34.8–46.6)
HEMOGLOBIN: 9.8 g/dL — AB (ref 11.6–15.9)
LYMPH#: 0.7 10*3/uL — AB (ref 0.9–3.3)
LYMPH%: 12.4 % — ABNORMAL LOW (ref 14.0–48.0)
MCH: 35.3 pg — ABNORMAL HIGH (ref 26.0–34.0)
MCHC: 33 g/dL (ref 32.0–36.0)
MCV: 107 fL — AB (ref 81–101)
MONO#: 0.1 10*3/uL (ref 0.1–0.9)
MONO%: 1.7 % (ref 0.0–13.0)
NEUT%: 85.4 % — AB (ref 39.6–80.0)
NEUTROS ABS: 5 10*3/uL (ref 1.5–6.5)
Platelets: 212 10*3/uL (ref 145–400)
RBC: 2.78 10*6/uL — ABNORMAL LOW (ref 3.70–5.32)
RDW: 13.7 % (ref 11.1–15.7)
WBC: 5.9 10*3/uL (ref 3.9–10.0)

## 2015-09-09 LAB — CMP (CANCER CENTER ONLY)
ALBUMIN: 2.9 g/dL — AB (ref 3.3–5.5)
ALT(SGPT): 27 U/L (ref 10–47)
AST: 22 U/L (ref 11–38)
Alkaline Phosphatase: 55 U/L (ref 26–84)
BILIRUBIN TOTAL: 0.5 mg/dL (ref 0.20–1.60)
BUN, Bld: 20 mg/dL (ref 7–22)
CALCIUM: 7.9 mg/dL — AB (ref 8.0–10.3)
CHLORIDE: 103 meq/L (ref 98–108)
CO2: 28 meq/L (ref 18–33)
Creat: 1.4 mg/dl — ABNORMAL HIGH (ref 0.6–1.2)
Glucose, Bld: 134 mg/dL — ABNORMAL HIGH (ref 73–118)
POTASSIUM: 4.9 meq/L — AB (ref 3.3–4.7)
SODIUM: 141 meq/L (ref 128–145)
Total Protein: 5.8 g/dL — ABNORMAL LOW (ref 6.4–8.1)

## 2015-09-09 LAB — FERRITIN: Ferritin: 799 ng/ml — ABNORMAL HIGH (ref 9–269)

## 2015-09-09 LAB — IRON AND TIBC
%SAT: 42 % (ref 21–57)
IRON: 75 ug/dL (ref 41–142)
TIBC: 179 ug/dL — AB (ref 236–444)
UIBC: 104 ug/dL — AB (ref 120–384)

## 2015-09-09 MED ORDER — PALONOSETRON HCL INJECTION 0.25 MG/5ML
0.2500 mg | Freq: Once | INTRAVENOUS | Status: AC
  Administered 2015-09-09: 0.25 mg via INTRAVENOUS

## 2015-09-09 MED ORDER — SODIUM CHLORIDE 0.9 % IJ SOLN
10.0000 mL | INTRAMUSCULAR | Status: DC | PRN
  Administered 2015-09-09: 10 mL
  Filled 2015-09-09: qty 10

## 2015-09-09 MED ORDER — PALONOSETRON HCL INJECTION 0.25 MG/5ML
INTRAVENOUS | Status: AC
  Filled 2015-09-09: qty 5

## 2015-09-09 MED ORDER — SODIUM CHLORIDE 0.9 % IV SOLN
Freq: Once | INTRAVENOUS | Status: AC
  Administered 2015-09-09: 13:00:00 via INTRAVENOUS
  Filled 2015-09-09: qty 5

## 2015-09-09 MED ORDER — SODIUM CHLORIDE 0.9 % IV SOLN
510.0000 mg | Freq: Once | INTRAVENOUS | Status: AC
  Administered 2015-09-09: 510 mg via INTRAVENOUS
  Filled 2015-09-09: qty 17

## 2015-09-09 MED ORDER — HEPARIN SOD (PORK) LOCK FLUSH 100 UNIT/ML IV SOLN
500.0000 [IU] | Freq: Once | INTRAVENOUS | Status: AC | PRN
  Administered 2015-09-09: 500 [IU]
  Filled 2015-09-09: qty 5

## 2015-09-09 MED ORDER — HYDROMORPHONE HCL 2 MG/ML IJ SOLN
2.0000 mg | Freq: Once | INTRAMUSCULAR | Status: DC
  Filled 2015-09-09: qty 1

## 2015-09-09 MED ORDER — HYDROMORPHONE HCL 1 MG/ML IJ SOLN
2.0000 mg | Freq: Once | INTRAMUSCULAR | Status: AC
  Administered 2015-09-09: 2 mg via INTRAVENOUS

## 2015-09-09 MED ORDER — PACLITAXEL PROTEIN-BOUND CHEMO INJECTION 100 MG
100.0000 mg/m2 | Freq: Once | INTRAVENOUS | Status: AC
Start: 2015-09-09 — End: 2015-09-09
  Administered 2015-09-09: 225 mg via INTRAVENOUS
  Filled 2015-09-09: qty 45

## 2015-09-09 MED ORDER — HYDROMORPHONE HCL 1 MG/ML IJ SOLN
INTRAMUSCULAR | Status: AC
Start: 2015-09-09 — End: 2015-09-09
  Filled 2015-09-09: qty 2

## 2015-09-09 MED ORDER — SODIUM CHLORIDE 0.9 % IV SOLN
Freq: Once | INTRAVENOUS | Status: AC
  Administered 2015-09-09: 12:00:00 via INTRAVENOUS

## 2015-09-09 NOTE — Progress Notes (Signed)
Hematology and Oncology Follow Up Visit  Kerri Vasquez [id number] January 07, 1956 60 y.o. 09/09/2015   Principle Diagnosis:  1. Metastatic breast cancer, progressive liver metastases  2. Gastric bypass for subsequent B12 and iron deficiency 3. Osteoarthritis with chronic pain 4. 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 Abraxane q 21 days s/p cycle 5 2. Vitamin B12 1 mg IM monthly 3. IV iron as indicated    Interim History:  Kerri Vasquez is here today.  She is holding her own. She is really bothered by back pain which is not from cancer but from severe arthritis. She has knee pain from arthritis. She is not a surgical candidate for any intervention for these problems.  We did go ahead and repeat a PET scan on her. The PET scan showed slight progression of her breast cancer. No new disease was noted. The liver met that she has was slightly larger. It measures 4.4 x 2.9 cm. There was indeterminate periportal lymph node and peritoneal thickening. No ascites was noted.  I think she has tolerated chemotherapy pretty well.  She is feeling a little more tired. This uses a sign of low iron. Back in March, her iron saturation was only 24%. Her serum iron was 43. Her ferritin is elevated because of her malignancy and because of her severe arthritis. I suspect that she is iron deficient. We will go ahead and give her a dose of IV iron.  She did get vitamin B-12 a week ago.  Overall, her performance status is ECOG 2.  Medications:    Medication List       This list is accurate as of: 09/09/15 11:57 AM.  Always use your most recent med list.               carvedilol 6.25 MG tablet  Commonly known as:  COREG  Take 1 tablet (6.25 mg total) by mouth 2 (two) times daily with a meal.     cyanocobalamin 1000 MCG/ML injection  Commonly known as:  (VITAMIN B-12)  INJECT 1 ML AS DIRECTED EVERY 21 DAYS     dexamethasone 4 MG tablet  Commonly known  as:  DECADRON  TAKE 2 TABLETS BY MOUTH TWICE DAILY WITH A MEAL STARTING THE DAY AFTER CHEMOTHERAPY FOR 2 DAYS     fentaNYL 50 MCG/HR  Commonly known as:  DURAGESIC - dosed mcg/hr  Place 2 patches (100 mcg total) onto the skin every other day.     fentaNYL 25 MCG/HR patch  Commonly known as:  DURAGESIC - dosed mcg/hr  Place 1 patch (25 mcg total) onto the skin every other day.     FLUoxetine 40 MG capsule  Commonly known as:  PROZAC  Take 40 mg by mouth daily before breakfast.     furosemide 20 MG tablet  Commonly known as:  LASIX  Take 60 mg by mouth daily.     levothyroxine 150 MCG tablet  Commonly known as:  SYNTHROID, LEVOTHROID  Take 1 tablet (150 mcg total) by mouth daily.     lidocaine-prilocaine cream  Commonly known as:  EMLA  Apply to affected area once     LORazepam 0.5 MG tablet  Commonly known as:  ATIVAN  PLACE 1 TABLET UNDER TONGUE EVERY 6 HOURS AS NEEDED FOR NAUSEA AND VOMITING     LYRICA 200 MG capsule  Generic drug:  pregabalin  TAKE 1 CAPSULE 2 TIMES A DAY     methocarbamol 750  MG tablet  Commonly known as:  ROBAXIN  Take 750 mg by mouth 4 (four) times daily.     MULTI-VITAMINS Tabs  Take 1 tablet by mouth daily.     nitroGLYCERIN 0.4 MG SL tablet  Commonly known as:  NITROSTAT  Place 1 tablet (0.4 mg total) under the tongue every 5 (five) minutes as needed for chest pain.     ondansetron 8 MG tablet  Commonly known as:  ZOFRAN  Take 1 tablet (8 mg total) by mouth every 8 (eight) hours as needed for nausea or vomiting.     ondansetron 8 MG tablet  Commonly known as:  ZOFRAN  TAKE 1 TABLET BY MOUTH EVERY 8 HOURS AS NEEDED FOR NAUSEA AND VOMITING     oxyCODONE 15 MG immediate release tablet  Commonly known as:  ROXICODONE  Take 1 tablet (15 mg total) by mouth every 4 (four) hours as needed for pain.     pantoprazole 40 MG tablet  Commonly known as:  PROTONIX  Take 1 tablet (40 mg total) by mouth 2 (two) times daily.     polyethylene glycol  packet  Commonly known as:  MIRALAX / GLYCOLAX  Take 17 g by mouth 2 (two) times daily.     potassium chloride SA 20 MEQ tablet  Commonly known as:  KLOR-CON M20  Take 2 tablets (40 mEq total) by mouth 2 (two) times daily.     prochlorperazine 10 MG tablet  Commonly known as:  COMPAZINE  TAKE 1 TABLET BY MOUTH EVERY 6 HOURS AS NEEDED FOR NAUSEA OR VOMITING.     promethazine 25 MG tablet  Commonly known as:  PHENERGAN  TAKE 1 TABLET BY MOUTH THREE TIMES DAILY AS NEEDED FOR NAUSEA     temazepam 15 MG capsule  Commonly known as:  RESTORIL  Take 30 mg by mouth at bedtime.     tiZANidine 4 MG tablet  Commonly known as:  ZANAFLEX  Take 4 mg by mouth every 6 (six) hours as needed for muscle spasms.        Allergies:  Allergies  Allergen Reactions  . Influenza Vac Split [Flu Virus Vaccine] Other (See Comments)    Joint stiffness and renal failure  . Ritalin [Methylphenidate Hcl]     "Heart attack"  . Zostavax [Zoster Vaccine Live ([employer])] Other (See Comments)    Joint stiffness, renal failure  . Adhesive [Tape] Itching and Rash    Past Medical History, Surgical history, Social history, and Family History were reviewed and updated.  Review of Systems: All other 10 point review of systems is negative.   Physical Exam:  height is '5\' 6"'  (1.676 m) and weight is 247 lb (112.038 kg). Her oral temperature is 97.6 F (36.4 C). Her blood pressure is 100/51 and her pulse is 83. Her respiration is 16.   Wt Readings from Last 3 Encounters:  09/09/15 247 lb (112.038 kg)  08/26/15 250 lb (113.399 kg)  08/19/15 248 lb (112.492 kg)    Obese white female in no obvious distress. Head and neck exam shows no ocular or oral lesions. She has no scleral icterus. She has no adenopathy in the neck. She has no palpable thyroid. Lungs are with decreased at the bases. Cardiac exam regular rate and rhythm with no murmurs, rubs or bruits. Abdomen is obese but soft. She has good bowel sounds.  She'll a little bit of a abdominal wall hernia. This is chronic. This reduces easily. There is no palpable hepatomegaly.  Extremities shows chronic nonpitting edema of the lower legs. She has decent range of motion of her joints. Skin exam shows no rashes. Neurological exam shows no focal neurological deficits. .  Lab Results  Component Value Date   WBC 5.9 09/09/2015   HGB 9.8* 09/09/2015   HCT 29.7* 09/09/2015   MCV 107* 09/09/2015   PLT 212 09/09/2015   Lab Results  Component Value Date   FERRITIN 839* 07/15/2015   IRON 43 07/15/2015   TIBC 176* 07/15/2015   UIBC 133 07/15/2015   IRONPCTSAT 24 07/15/2015   Lab Results  Component Value Date   RETICCTPCT 1.0 06/29/2014   RBC 2.78* 09/09/2015   RETICCTABS 32.3 06/29/2014   No results found for: KPAFRELGTCHN, LAMBDASER, KAPLAMBRATIO No results found for: Kandis Cocking, IGMSERUM Lab Results  Component Value Date   TOTALPROTELP 4.7* 01/31/2010   ALBUMINELP 39.4* 01/31/2010   A1GS 12.1* 01/31/2010   A2GS 20.3* 01/31/2010   BETS 6.0 01/31/2010   BETA2SER 6.8* 01/31/2010   GAMS 15.4 01/31/2010   MSPIKE NOT DETECTED 01/31/2010   SPEI  01/31/2010    (NOTE) The possibility of a faint restricted band(s) cannot be completely excluded in the gamma region.  Suggest serum IFE to evaluate possibility, if clinically indicated. (Lab will hold sample one week. Please call Customer Service at [phone] to  add test.) Reviewed by Odis Hollingshead, MD, PhD, FCAP (Electronic Signature on File)     Chemistry      Component Value Date/Time   NA 140 08/26/2015 0939   NA 143 04/21/2015 0530   NA 141 03/18/2015 0918   K 3.8 08/26/2015 0939   K 3.3* 04/21/2015 0530   K 3.8 03/18/2015 0918   CL 106 08/26/2015 0939   CL 111 04/21/2015 0530   CO2 23 08/26/2015 0939   CO2 24 04/21/2015 0530   CO2 26 03/18/2015 0918   BUN 20 08/26/2015 0939   BUN 10 04/21/2015 0530   BUN 12.5 03/18/2015 0918   CREATININE 1.5* 08/26/2015 0939    CREATININE 1.15* 04/21/2015 0530   CREATININE 1.2* 03/18/2015 0918      Component Value Date/Time   CALCIUM 6.2* 08/26/2015 0939   CALCIUM 7.9* 04/21/2015 0530   CALCIUM 9.2 03/18/2015 0918   ALKPHOS 55 08/26/2015 0939   ALKPHOS 60 04/21/2015 0530   ALKPHOS 74 03/18/2015 0918   AST 28 08/26/2015 0939   AST 17 04/21/2015 0530   AST 21 03/18/2015 0918   ALT 28 08/26/2015 0939   ALT 12* 04/21/2015 0530   ALT 11 03/18/2015 0918   BILITOT 0.60 08/26/2015 0939   BILITOT 0.8 04/21/2015 0530   BILITOT 0.57 03/18/2015 0918     Impression and Plan: Kerri Vasquez is 60 year old white female with metastatic breast cancer.   From my point of view, I think that she really is doing well. I just don't think that we should change her therapy right now. I think that if anything, additional therapy would have more toxicity and would affect her quality of life.  Of note, her last CA-27-29 was holding pretty steady at 27.  I want to go to give her some iron today. I'll get her blood smear. She does have some microcytic changes. She has a gastric bypass so she does not absorb iron. His been several months that we have given her iron. Her hemoglobin going down certainly suggest iron deficiency.  I will like to see her back in 3 weeks.  After her eighth cycle treatment,  then we will repeat another scan.  Her birthday is coming up in middle of May.  I spent about 30 minutes with her today.  Kerri Napoleon, MD 5/11/201711:57 AM

## 2015-09-09 NOTE — Patient Instructions (Signed)
[facility] Discharge Instructions for Patients Receiving Chemotherapy  Today you received the following chemotherapy agents Abraxane  To help prevent nausea and vomiting after your treatment, we encourage you to take your nausea medication    If you develop nausea and vomiting that is not controlled by your nausea medication, call the clinic.   BELOW ARE SYMPTOMS THAT SHOULD BE REPORTED IMMEDIATELY:  *FEVER GREATER THAN 100.5 F  *CHILLS WITH OR WITHOUT FEVER  NAUSEA AND VOMITING THAT IS NOT CONTROLLED WITH YOUR NAUSEA MEDICATION  *UNUSUAL SHORTNESS OF BREATH  *UNUSUAL BRUISING OR BLEEDING  TENDERNESS IN MOUTH AND THROAT WITH OR WITHOUT PRESENCE OF ULCERS  *URINARY PROBLEMS  *BOWEL PROBLEMS  UNUSUAL RASH Items with * indicate a potential emergency and should be followed up as soon as possible.  Feel free to call the clinic you have any questions or concerns. The clinic phone number is [phone].  Please show the [facility] at check-in to the Emergency Department and triage nurse.  [facility] Discharge Instructions for Patients Receiving Chemotherapy  Today you received the following chemotherapy agents Abraxane  To help prevent nausea and vomiting after your treatment, we encourage you to take your nausea medication    If you develop nausea and vomiting that is not controlled by your nausea medication, call the clinic.   BELOW ARE SYMPTOMS THAT SHOULD BE REPORTED IMMEDIATELY:  *FEVER GREATER THAN 100.5 F  *CHILLS WITH OR WITHOUT FEVER  NAUSEA AND VOMITING THAT IS NOT CONTROLLED WITH YOUR NAUSEA MEDICATION  *UNUSUAL SHORTNESS OF BREATH  *UNUSUAL BRUISING OR BLEEDING  TENDERNESS IN MOUTH AND THROAT WITH OR WITHOUT PRESENCE OF ULCERS  *URINARY PROBLEMS  *BOWEL PROBLEMS  UNUSUAL RASH Items with * indicate a potential emergency and should be followed up as soon as possible.  Feel free to call the clinic you have any  questions or concerns. The clinic phone number is [phone].  Please show the [facility] at check-in to the Emergency Department and triage nurse.

## 2015-09-10 LAB — VITAMIN B12: Vitamin B12: 987 pg/mL — ABNORMAL HIGH (ref 211–946)

## 2015-09-10 LAB — CANCER ANTIGEN 27.29: CAN 27.29: 74.3 U/mL — AB (ref 0.0–38.6)

## 2015-09-13 ENCOUNTER — Other Ambulatory Visit (HOSPITAL_COMMUNITY): Payer: Self-pay | Admitting: Cardiology

## 2015-09-14 ENCOUNTER — Other Ambulatory Visit (HOSPITAL_COMMUNITY): Payer: Self-pay | Admitting: Cardiology

## 2015-09-15 ENCOUNTER — Other Ambulatory Visit: Payer: Self-pay | Admitting: Family

## 2015-09-15 ENCOUNTER — Other Ambulatory Visit: Payer: Self-pay | Admitting: Internal Medicine

## 2015-09-15 ENCOUNTER — Encounter: Payer: Self-pay | Admitting: Hematology & Oncology

## 2015-09-15 ENCOUNTER — Other Ambulatory Visit: Payer: Self-pay | Admitting: Hematology & Oncology

## 2015-09-16 ENCOUNTER — Other Ambulatory Visit: Payer: Self-pay | Admitting: Family

## 2015-09-16 ENCOUNTER — Encounter: Payer: Self-pay | Admitting: Hematology & Oncology

## 2015-09-16 ENCOUNTER — Ambulatory Visit (HOSPITAL_BASED_OUTPATIENT_CLINIC_OR_DEPARTMENT_OTHER): Payer: Medicare Other | Admitting: Hematology & Oncology

## 2015-09-16 ENCOUNTER — Other Ambulatory Visit: Payer: Self-pay | Admitting: *Deleted

## 2015-09-16 ENCOUNTER — Other Ambulatory Visit (HOSPITAL_BASED_OUTPATIENT_CLINIC_OR_DEPARTMENT_OTHER): Payer: Medicare Other

## 2015-09-16 ENCOUNTER — Ambulatory Visit (HOSPITAL_BASED_OUTPATIENT_CLINIC_OR_DEPARTMENT_OTHER): Payer: Medicare Other

## 2015-09-16 VITALS — BP 130/78 | HR 79 | Temp 99.5°F | Resp 16 | Ht 66.0 in | Wt 239.0 lb

## 2015-09-16 DIAGNOSIS — C50911 Malignant neoplasm of unspecified site of right female breast: Secondary | ICD-10-CM

## 2015-09-16 DIAGNOSIS — E860 Dehydration: Secondary | ICD-10-CM

## 2015-09-16 DIAGNOSIS — M545 Low back pain: Secondary | ICD-10-CM

## 2015-09-16 DIAGNOSIS — C50919 Malignant neoplasm of unspecified site of unspecified female breast: Secondary | ICD-10-CM

## 2015-09-16 DIAGNOSIS — D500 Iron deficiency anemia secondary to blood loss (chronic): Secondary | ICD-10-CM

## 2015-09-16 DIAGNOSIS — G8929 Other chronic pain: Secondary | ICD-10-CM

## 2015-09-16 DIAGNOSIS — D510 Vitamin B12 deficiency anemia due to intrinsic factor deficiency: Secondary | ICD-10-CM

## 2015-09-16 DIAGNOSIS — K0520 Aggressive periodontitis, unspecified: Secondary | ICD-10-CM | POA: Diagnosis not present

## 2015-09-16 DIAGNOSIS — K909 Intestinal malabsorption, unspecified: Secondary | ICD-10-CM

## 2015-09-16 DIAGNOSIS — C787 Secondary malignant neoplasm of liver and intrahepatic bile duct: Secondary | ICD-10-CM

## 2015-09-16 DIAGNOSIS — C50912 Malignant neoplasm of unspecified site of left female breast: Secondary | ICD-10-CM

## 2015-09-16 DIAGNOSIS — I150 Renovascular hypertension: Secondary | ICD-10-CM

## 2015-09-16 LAB — CBC WITH DIFFERENTIAL (CANCER CENTER ONLY)
BASO#: 0 10*3/uL (ref 0.0–0.2)
BASO%: 0.4 % (ref 0.0–2.0)
EOS%: 1.7 % (ref 0.0–7.0)
Eosinophils Absolute: 0.2 10*3/uL (ref 0.0–0.5)
HCT: 28 % — ABNORMAL LOW (ref 34.8–46.6)
HGB: 9.4 g/dL — ABNORMAL LOW (ref 11.6–15.9)
LYMPH#: 1.3 10*3/uL (ref 0.9–3.3)
LYMPH%: 13.9 % — AB (ref 14.0–48.0)
MCH: 35.1 pg — ABNORMAL HIGH (ref 26.0–34.0)
MCHC: 33.6 g/dL (ref 32.0–36.0)
MCV: 105 fL — AB (ref 81–101)
MONO#: 0.4 10*3/uL (ref 0.1–0.9)
MONO%: 4.5 % (ref 0.0–13.0)
NEUT#: 7.4 10*3/uL — ABNORMAL HIGH (ref 1.5–6.5)
NEUT%: 79.5 % (ref 39.6–80.0)
PLATELETS: 230 10*3/uL (ref 145–400)
RBC: 2.68 10*6/uL — ABNORMAL LOW (ref 3.70–5.32)
RDW: 13.2 % (ref 11.1–15.7)
WBC: 9.4 10*3/uL (ref 3.9–10.0)

## 2015-09-16 LAB — CMP (CANCER CENTER ONLY)
ALBUMIN: 3.3 g/dL (ref 3.3–5.5)
ALK PHOS: 46 U/L (ref 26–84)
ALT: 23 U/L (ref 10–47)
AST: 21 U/L (ref 11–38)
BILIRUBIN TOTAL: 0.7 mg/dL (ref 0.20–1.60)
BUN, Bld: 16 mg/dL (ref 7–22)
CALCIUM: 6.9 mg/dL — AB (ref 8.0–10.3)
CO2: 26 mEq/L (ref 18–33)
Chloride: 106 mEq/L (ref 98–108)
Creat: 1.1 mg/dl (ref 0.6–1.2)
GLUCOSE: 93 mg/dL (ref 73–118)
Potassium: 3.2 mEq/L — ABNORMAL LOW (ref 3.3–4.7)
Sodium: 141 mEq/L (ref 128–145)
Total Protein: 5.9 g/dL — ABNORMAL LOW (ref 6.4–8.1)

## 2015-09-16 MED ORDER — FENTANYL 50 MCG/HR TD PT72: 100.0000 ug | MEDICATED_PATCH | TRANSDERMAL | Status: DC

## 2015-09-16 MED ORDER — PANTOPRAZOLE SODIUM 40 MG PO TBEC: 40.0000 mg | DELAYED_RELEASE_TABLET | Freq: Two times a day (BID) | ORAL | Status: AC

## 2015-09-16 MED ORDER — SODIUM CHLORIDE 0.9 % IV SOLN
INTRAVENOUS | Status: AC
  Administered 2015-09-16: 11:00:00 via INTRAVENOUS

## 2015-09-16 MED ORDER — FAMOTIDINE IN NACL 20-0.9 MG/50ML-% IV SOLN
INTRAVENOUS | Status: AC
  Filled 2015-09-16: qty 100

## 2015-09-16 MED ORDER — POLYETHYLENE GLYCOL 3350 17 G PO PACK: 17.0000 g | PACK | Freq: Two times a day (BID) | ORAL | Status: AC

## 2015-09-16 MED ORDER — DIPHENOXYLATE-ATROPINE 2.5-0.025 MG PO TABS: 1.0000 | ORAL_TABLET | Freq: Four times a day (QID) | ORAL | Status: DC | PRN

## 2015-09-16 MED ORDER — HYDROMORPHONE HCL 1 MG/ML IJ SOLN
INTRAMUSCULAR | Status: AC
  Filled 2015-09-16: qty 2

## 2015-09-16 MED ORDER — FAMOTIDINE IN NACL 20-0.9 MG/50ML-% IV SOLN
40.0000 mg | Freq: Two times a day (BID) | INTRAVENOUS | Status: DC
  Administered 2015-09-16: 40 mg via INTRAVENOUS

## 2015-09-16 MED ORDER — SODIUM CHLORIDE 0.9 % IV SOLN
3.0000 g | Freq: Once | INTRAVENOUS | Status: AC
  Administered 2015-09-16: 3 g via INTRAVENOUS
  Filled 2015-09-16: qty 3

## 2015-09-16 MED ORDER — OXYCODONE HCL 15 MG PO TABS
15.0000 mg | ORAL_TABLET | ORAL | Status: DC | PRN
Start: 2015-09-16 — End: 2015-10-13

## 2015-09-16 MED ORDER — HYDROMORPHONE HCL 1 MG/ML IJ SOLN
2.0000 mg | Freq: Once | INTRAMUSCULAR | Status: AC
  Administered 2015-09-16: 2 mg via INTRAVENOUS

## 2015-09-16 MED ORDER — AMPICILLIN 500 MG PO CAPS: 500.0000 mg | ORAL_CAPSULE | Freq: Four times a day (QID) | ORAL | Status: DC

## 2015-09-16 MED ORDER — FENTANYL 25 MCG/HR TD PT72: 25.0000 ug | MEDICATED_PATCH | TRANSDERMAL | Status: DC

## 2015-09-16 MED ORDER — PREGABALIN 200 MG PO CAPS: ORAL_CAPSULE | ORAL | Status: DC

## 2015-09-16 NOTE — Patient Instructions (Signed)
Diarrhea Diarrhea is frequent loose and watery bowel movements. It can cause you to feel weak and dehydrated. Dehydration can cause you to become tired and thirsty, have a dry mouth, and have decreased urination that often is dark yellow. Diarrhea is a sign of another problem, most often an infection that will not last long. In most cases, diarrhea typically lasts 2-3 days. However, it can last longer if it is a sign of something more serious. It is important to treat your diarrhea as directed by your caregiver to lessen or prevent future episodes of diarrhea. CAUSES  Some common causes include:  Gastrointestinal infections caused by viruses, bacteria, or parasites.  Food poisoning or food allergies.  Certain medicines, such as antibiotics, chemotherapy, and laxatives.  Artificial sweeteners and fructose.  Digestive disorders. HOME CARE INSTRUCTIONS  Ensure adequate fluid intake (hydration): Have 1 cup (8 oz) of fluid for each diarrhea episode. Avoid fluids that contain simple sugars or sports drinks, fruit juices, whole milk products, and sodas. Your urine should be clear or pale yellow if you are drinking enough fluids. Hydrate with an oral rehydration solution that you can purchase at pharmacies, retail stores, and online. You can prepare an oral rehydration solution at home by mixing the following ingredients together:   - tsp table salt.   tsp baking soda.   tsp salt substitute containing potassium chloride.  1  tablespoons sugar.  1 L (34 oz) of water.  Certain foods and beverages may increase the speed at which food moves through the gastrointestinal (GI) tract. These foods and beverages should be avoided and include:  Caffeinated and alcoholic beverages.  High-fiber foods, such as raw fruits and vegetables, nuts, seeds, and whole grain breads and cereals.  Foods and beverages sweetened with sugar alcohols, such as xylitol, sorbitol, and mannitol.  Some foods may be well  tolerated and may help thicken stool including:  Starchy foods, such as rice, toast, pasta, low-sugar cereal, oatmeal, grits, baked potatoes, crackers, and bagels.  Bananas.  Applesauce.  Add probiotic-rich foods to help increase healthy bacteria in the GI tract, such as yogurt and fermented milk products.  Wash your hands well after each diarrhea episode.  Only take over-the-counter or prescription medicines as directed by your caregiver.  Take a warm bath to relieve any burning or pain from frequent diarrhea episodes. SEEK IMMEDIATE MEDICAL CARE IF:   You are unable to keep fluids down.  You have persistent vomiting.  You have blood in your stool, or your stools are black and tarry.  You do not urinate in 6-8 hours, or there is only a small amount of very dark urine.  You have abdominal pain that increases or localizes.  You have weakness, dizziness, confusion, or light-headedness.  You have a severe headache.  Your diarrhea gets worse or does not get better.  You have a fever or persistent symptoms for more than 2-3 days.  You have a fever and your symptoms suddenly get worse. MAKE SURE YOU:   Understand these instructions.  Will watch your condition.  Will get help right away if you are not doing well or get worse.   This information is not intended to replace advice given to you by your health care provider. Make sure you discuss any questions you have with your health care provider.   Document Released: 04/07/2002 Document Revised: 05/08/2014 Document Reviewed: 12/24/2011 Elsevier Interactive Patient Education 2016 Elsevier Inc. Dehydration, Adult Dehydration means your body does not have as much fluid  or water as it needs. It happens when you take in less fluid than you lose. Your kidneys, brain, and heart will not work properly without the right amount of fluids.  Dehydration can range from mild to severe. It should be treated right away to help prevent it  from becoming severe. HOME CARE  Drink enough fluid to keep your pee (urine) clear or pale yellow.  Drink water or fluid slowly by taking small sips. You can also try sucking on ice cubes.  Have food or drinks that contain electrolytes. Examples include bananas and sports drinks.  Take over-the-counter and prescription medicines only as told by your doctor.  Prepare oral rehydration solution (ORS) according to the instructions that came with it. Take sips of ORS every 5 minutes until your pee returns to normal.  If you are throwing up (vomiting) or have watery poop (diarrhea), keep trying to drink water, ORS, or both.  If you have watery poop, avoid:  Drinks with caffeine.  Fruit juice.  Milk.  Carbonated soft drinks.  Do not take salt tablets. This can lead to having too much sodium in your body (hypernatremia). GET HELP IF:  You cannot eat or drink without throwing up.  You have had mild watery poop for longer than 24 hours.  You have a fever. GET HELP RIGHT AWAY IF:   You have very strong thirst.  You have very bad watery poop.  You have not peed in 6-8 hours, or you have peed only a small amount of very dark pee.  You have shriveled skin.  You are dizzy, confused, or both.   This information is not intended to replace advice given to you by your health care provider. Make sure you discuss any questions you have with your health care provider.   Document Released: 02/11/2009 Document Revised: 01/06/2015 Document Reviewed: 09/02/2014 Elsevier Interactive Patient Education [employer].

## 2015-09-20 NOTE — Progress Notes (Addendum)
Hematology and Oncology Follow Up Visit  Dawnette Krogh [id number] Mar 16, 1956 60 y.o. 09/20/2015   Principle Diagnosis:  1. Metastatic breast cancer, progressive liver metastases  2. Gastric bypass for subsequent B12 and iron deficiency 3. Osteoarthritis with chronic pain 4. 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 Abraxane q 21 days s/p cycle7 2. Vitamin B12 1 mg IM monthly 3. IV iron as indicated    Interim History:  Ms. Marella Bile is here today.  She was supposed to get her day 8 of cycle 7 of chemotherapy today. However, she started to have some gastroenteritis type symptoms. She's having some diarrhea. This started a couple days ago. She is in no condition to take any treatment today.  She's having some issues with her mouth. She has some periodontal issues. She's had some dental condition that probably need to be seen by our dentist.  I think that she probably would benefit from some antibiotics. I do not want to see an abscess develop in her mouth.  She's had no bleeding. She's had no obvious fever. She is weak. She has a lot of other health issues. She is on horrible arthritis. She decompensates pretty quickly.  Again, we will not give her any chemotherapy today.  She has had no cough. She has had no rashes. She has had no increased leg swelling.  Overall, her performance status is ECOG 2.  Medications:    Medication List       This list is accurate as of: 09/16/15 11:59 PM.  Always use your most recent med list.               ampicillin 500 MG capsule  Commonly known as:  PRINCIPEN  Take 1 capsule (500 mg total) by mouth 4 (four) times daily.     carvedilol 6.25 MG tablet  Commonly known as:  COREG  Take 1 tablet (6.25 mg total) by mouth 2 (two) times daily with a meal.     cyanocobalamin 1000 MCG/ML injection  Commonly known as:  (VITAMIN B-12)  INJECT 1 ML AS DIRECTED EVERY 21 DAYS     dexamethasone 4 MG  tablet  Commonly known as:  DECADRON  TAKE 2 TABLETS BY MOUTH TWICE DAILY WITH A MEAL STARTING THE DAY AFTER CHEMOTHERAPY FOR 2 DAYS     diphenoxylate-atropine 2.5-0.025 MG tablet  Commonly known as:  LOMOTIL  Take 1 tablet by mouth 4 (four) times daily as needed for diarrhea or loose stools.     fentaNYL 50 MCG/HR  Commonly known as:  DURAGESIC - dosed mcg/hr  Place 2 patches (100 mcg total) onto the skin every other day.     fentaNYL 25 MCG/HR patch  Commonly known as:  DURAGESIC - dosed mcg/hr  Place 1 patch (25 mcg total) onto the skin every other day.     FLUoxetine 40 MG capsule  Commonly known as:  PROZAC  Take 40 mg by mouth daily before breakfast.     furosemide 20 MG tablet  Commonly known as:  LASIX  Take 60 mg by mouth daily.     furosemide 20 MG tablet  Commonly known as:  LASIX  Take 3 tablets (60 mg total) by mouth daily.     levothyroxine 150 MCG tablet  Commonly known as:  SYNTHROID, LEVOTHROID  Take 1 tablet (150 mcg total) by mouth daily.     lidocaine-prilocaine cream  Commonly known as:  EMLA  Apply to affected  area once     LORazepam 0.5 MG tablet  Commonly known as:  ATIVAN  PLACE 1 TABLET UNDER TONGUE EVERY 6 HOURS AS NEEDED FOR NAUSEA AND VOMITING     methocarbamol 750 MG tablet  Commonly known as:  ROBAXIN  Take 750 mg by mouth 4 (four) times daily.     MULTI-VITAMINS Tabs  Take 1 tablet by mouth daily.     nitroGLYCERIN 0.4 MG SL tablet  Commonly known as:  NITROSTAT  Place 1 tablet (0.4 mg total) under the tongue every 5 (five) minutes as needed for chest pain.     ondansetron 8 MG tablet  Commonly known as:  ZOFRAN  Take 1 tablet (8 mg total) by mouth every 8 (eight) hours as needed for nausea or vomiting.     ondansetron 8 MG tablet  Commonly known as:  ZOFRAN  TAKE 1 TABLET BY MOUTH EVERY 8 HOURS AS NEEDED FOR NAUSEA AND VOMITING     oxyCODONE 15 MG immediate release tablet  Commonly known as:  ROXICODONE  Take 1 tablet (15  mg total) by mouth every 4 (four) hours as needed for pain.     pantoprazole 40 MG tablet  Commonly known as:  PROTONIX  Take 1 tablet (40 mg total) by mouth 2 (two) times daily.     polyethylene glycol packet  Commonly known as:  MIRALAX / GLYCOLAX  Take 17 g by mouth 2 (two) times daily.     potassium chloride SA 20 MEQ tablet  Commonly known as:  K-DUR,KLOR-CON  TAKE 2 TABLETS BY MOUTH TWICE DAILY     pregabalin 200 MG capsule  Commonly known as:  LYRICA  TAKE 1 CAPSULE 2 TIMES A DAY     prochlorperazine 10 MG tablet  Commonly known as:  COMPAZINE  TAKE 1 TABLET BY MOUTH EVERY 6 HOURS AS NEEDED FOR NAUSEA OR VOMITING.     promethazine 25 MG tablet  Commonly known as:  PHENERGAN  TAKE 1 TABLET BY MOUTH THREE TIMES DAILY AS NEEDED FOR NAUSEA     temazepam 15 MG capsule  Commonly known as:  RESTORIL  Take 30 mg by mouth at bedtime.     tiZANidine 4 MG tablet  Commonly known as:  ZANAFLEX  Take 4 mg by mouth every 6 (six) hours as needed for muscle spasms.        Allergies:  Allergies  Allergen Reactions  . Influenza Vac Split [Flu Virus Vaccine] Other (See Comments)    Joint stiffness and renal failure  . Ritalin [Methylphenidate Hcl]     "Heart attack"  . Zostavax [Zoster Vaccine Live ([employer])] Other (See Comments)    Joint stiffness, renal failure  . Adhesive [Tape] Itching and Rash    Past Medical History, Surgical history, Social history, and Family History were reviewed and updated.  Review of Systems: All other 10 point review of systems is negative.   Physical Exam:  height is 5\' 6"  (1.676 m) and weight is 239 lb (108.41 kg). Her oral temperature is 99.5 F (37.5 C). Her blood pressure is 130/78 and her pulse is 79. Her respiration is 16.   Wt Readings from Last 3 Encounters:  09/16/15 239 lb (108.41 kg)  09/09/15 247 lb (112.038 kg)  08/26/15 250 lb (113.399 kg)    Obese white female in no obvious distress. Head and neck exam shows no ocular  or oral lesions. She does have some gingival inflammation. I do not see any gingival bleeding.  There is some tenderness to palpation over her gums, mostly in the mandibular region. She has no scleral icterus. She has no adenopathy in the neck. She has no palpable thyroid. Lungs are with decreased at the bases. Cardiac exam regular rate and rhythm with no murmurs, rubs or bruits. Abdomen is obese but soft. She has good bowel sounds. She'll a little bit of a abdominal wall hernia. This is chronic. This reduces easily. There is no palpable hepatomegaly. Extremities shows chronic nonpitting edema of the lower legs. She has decent range of motion of her joints. Skin exam shows no rashes. Neurological exam shows no focal neurological deficits. .  Lab Results  Component Value Date   WBC 9.4 09/16/2015   HGB 9.4* 09/16/2015   HCT 28.0* 09/16/2015   MCV 105* 09/16/2015   PLT 230 09/16/2015   Lab Results  Component Value Date   FERRITIN 799* 09/09/2015   IRON 75 09/09/2015   TIBC 179* 09/09/2015   UIBC 104* 09/09/2015   IRONPCTSAT 42 09/09/2015   Lab Results  Component Value Date   RETICCTPCT 1.0 06/29/2014   RBC 2.68* 09/16/2015   RETICCTABS 32.3 06/29/2014   No results found for: KPAFRELGTCHN, LAMBDASER, KAPLAMBRATIO No results found for: Kandis Cocking, IGMSERUM Lab Results  Component Value Date   TOTALPROTELP 4.7* 01/31/2010   ALBUMINELP 39.4* 01/31/2010   A1GS 12.1* 01/31/2010   A2GS 20.3* 01/31/2010   BETS 6.0 01/31/2010   BETA2SER 6.8* 01/31/2010   GAMS 15.4 01/31/2010   MSPIKE NOT DETECTED 01/31/2010   SPEI  01/31/2010    (NOTE) The possibility of a faint restricted band(s) cannot be completely excluded in the gamma region.  Suggest serum IFE to evaluate possibility, if clinically indicated. (Lab will hold sample one week. Please call Customer Service at [phone] to  add test.) Reviewed by Odis Hollingshead, MD, PhD, FCAP (Electronic Signature on File)     Chemistry        Component Value Date/Time   NA 141 09/16/2015 0912   NA 143 04/21/2015 0530   NA 141 03/18/2015 0918   K 3.2* 09/16/2015 0912   K 3.3* 04/21/2015 0530   K 3.8 03/18/2015 0918   CL 106 09/16/2015 0912   CL 111 04/21/2015 0530   CO2 26 09/16/2015 0912   CO2 24 04/21/2015 0530   CO2 26 03/18/2015 0918   BUN 16 09/16/2015 0912   BUN 10 04/21/2015 0530   BUN 12.5 03/18/2015 0918   CREATININE 1.1 09/16/2015 0912   CREATININE 1.15* 04/21/2015 0530   CREATININE 1.2* 03/18/2015 0918      Component Value Date/Time   CALCIUM 6.9* 09/16/2015 0912   CALCIUM 7.9* 04/21/2015 0530   CALCIUM 9.2 03/18/2015 0918   ALKPHOS 46 09/16/2015 0912   ALKPHOS 60 04/21/2015 0530   ALKPHOS 74 03/18/2015 0918   AST 21 09/16/2015 0912   AST 17 04/21/2015 0530   AST 21 03/18/2015 0918   ALT 23 09/16/2015 0912   ALT 12* 04/21/2015 0530   ALT 11 03/18/2015 0918   BILITOT 0.70 09/16/2015 0912   BILITOT 0.8 04/21/2015 0530   BILITOT 0.57 03/18/2015 0918     Impression and Plan: [name] is 60 year old white female with metastatic breast cancer.   I'm not sure exactly this what is going on with her. Hopefully, this is just some kind of self-limited viral gastroenteritis. I will know that might be a little bit of food poisoning. I would not fees related to her underlying malignancy.  Her potassium is a little bit on the low side. We will give her some IV fluids.  I will give her some Unasyn. Again she is at significant risk for. I will infection. Given that she has a Port-A-Cath, I do not want to see the Port-A-Cath is again infected by oral bacteria. I will then get her on some Augmentin.  We will hold on her chemotherapy and just delay it for 1 week.  I spent about 30 minutes with her today.  Volanda Napoleon, MD 5/22/20177:30 AM

## 2015-09-21 ENCOUNTER — Encounter: Payer: Self-pay | Admitting: Hematology & Oncology

## 2015-09-24 ENCOUNTER — Encounter: Payer: Self-pay | Admitting: *Deleted

## 2015-09-30 ENCOUNTER — Other Ambulatory Visit: Payer: Self-pay | Admitting: Nurse Practitioner

## 2015-09-30 ENCOUNTER — Other Ambulatory Visit (HOSPITAL_BASED_OUTPATIENT_CLINIC_OR_DEPARTMENT_OTHER): Payer: Medicare Other

## 2015-09-30 ENCOUNTER — Encounter: Payer: Self-pay | Admitting: Hematology & Oncology

## 2015-09-30 ENCOUNTER — Other Ambulatory Visit: Payer: Self-pay | Admitting: Family

## 2015-09-30 ENCOUNTER — Ambulatory Visit (HOSPITAL_BASED_OUTPATIENT_CLINIC_OR_DEPARTMENT_OTHER): Payer: Medicare Other | Admitting: Hematology & Oncology

## 2015-09-30 ENCOUNTER — Ambulatory Visit (HOSPITAL_BASED_OUTPATIENT_CLINIC_OR_DEPARTMENT_OTHER): Payer: Medicare Other

## 2015-09-30 VITALS — BP 116/63 | HR 79 | Temp 98.3°F | Resp 18 | Ht 66.0 in | Wt 251.0 lb

## 2015-09-30 DIAGNOSIS — M545 Low back pain: Secondary | ICD-10-CM

## 2015-09-30 DIAGNOSIS — C787 Secondary malignant neoplasm of liver and intrahepatic bile duct: Secondary | ICD-10-CM

## 2015-09-30 DIAGNOSIS — C50919 Malignant neoplasm of unspecified site of unspecified female breast: Secondary | ICD-10-CM

## 2015-09-30 DIAGNOSIS — Z9884 Bariatric surgery status: Secondary | ICD-10-CM | POA: Diagnosis not present

## 2015-09-30 DIAGNOSIS — G8929 Other chronic pain: Secondary | ICD-10-CM

## 2015-09-30 DIAGNOSIS — C50912 Malignant neoplasm of unspecified site of left female breast: Secondary | ICD-10-CM

## 2015-09-30 DIAGNOSIS — E538 Deficiency of other specified B group vitamins: Secondary | ICD-10-CM | POA: Diagnosis not present

## 2015-09-30 DIAGNOSIS — Z5111 Encounter for antineoplastic chemotherapy: Secondary | ICD-10-CM | POA: Diagnosis present

## 2015-09-30 DIAGNOSIS — K909 Intestinal malabsorption, unspecified: Secondary | ICD-10-CM

## 2015-09-30 DIAGNOSIS — D510 Vitamin B12 deficiency anemia due to intrinsic factor deficiency: Secondary | ICD-10-CM

## 2015-09-30 DIAGNOSIS — D509 Iron deficiency anemia, unspecified: Secondary | ICD-10-CM

## 2015-09-30 DIAGNOSIS — K0520 Aggressive periodontitis, unspecified: Secondary | ICD-10-CM

## 2015-09-30 DIAGNOSIS — D500 Iron deficiency anemia secondary to blood loss (chronic): Secondary | ICD-10-CM

## 2015-09-30 LAB — IRON AND TIBC
%SAT: 48 % (ref 21–57)
IRON: 74 ug/dL (ref 41–142)
TIBC: 153 ug/dL — AB (ref 236–444)
UIBC: 79 ug/dL — ABNORMAL LOW (ref 120–384)

## 2015-09-30 LAB — CMP (CANCER CENTER ONLY)
ALT: 23 U/L (ref 10–47)
AST: 21 U/L (ref 11–38)
Albumin: 2.9 g/dL — ABNORMAL LOW (ref 3.3–5.5)
Alkaline Phosphatase: 68 U/L (ref 26–84)
BILIRUBIN TOTAL: 0.5 mg/dL (ref 0.20–1.60)
BUN: 23 mg/dL — AB (ref 7–22)
CALCIUM: 7.8 mg/dL — AB (ref 8.0–10.3)
CO2: 27 meq/L (ref 18–33)
CREATININE: 0.9 mg/dL (ref 0.6–1.2)
Chloride: 107 mEq/L (ref 98–108)
GLUCOSE: 90 mg/dL (ref 73–118)
POTASSIUM: 4.6 meq/L (ref 3.3–4.7)
Sodium: 141 mEq/L (ref 128–145)
TOTAL PROTEIN: 5.9 g/dL — AB (ref 6.4–8.1)

## 2015-09-30 LAB — CBC WITH DIFFERENTIAL (CANCER CENTER ONLY)
BASO#: 0 10*3/uL (ref 0.0–0.2)
BASO%: 0.4 % (ref 0.0–2.0)
EOS ABS: 0.3 10*3/uL (ref 0.0–0.5)
EOS%: 3.4 % (ref 0.0–7.0)
HEMATOCRIT: 31.9 % — AB (ref 34.8–46.6)
HGB: 10.2 g/dL — ABNORMAL LOW (ref 11.6–15.9)
LYMPH#: 1.4 10*3/uL (ref 0.9–3.3)
LYMPH%: 19.5 % (ref 14.0–48.0)
MCH: 34.5 pg — AB (ref 26.0–34.0)
MCHC: 32 g/dL (ref 32.0–36.0)
MCV: 108 fL — AB (ref 81–101)
MONO#: 0.6 10*3/uL (ref 0.1–0.9)
MONO%: 8.2 % (ref 0.0–13.0)
NEUT#: 5 10*3/uL (ref 1.5–6.5)
NEUT%: 68.5 % (ref 39.6–80.0)
PLATELETS: 173 10*3/uL (ref 145–400)
RBC: 2.96 10*6/uL — AB (ref 3.70–5.32)
RDW: 13.8 % (ref 11.1–15.7)
WBC: 7.3 10*3/uL (ref 3.9–10.0)

## 2015-09-30 LAB — LACTATE DEHYDROGENASE: LDH: 248 U/L — ABNORMAL HIGH (ref 125–245)

## 2015-09-30 LAB — FERRITIN: Ferritin: 875 ng/ml — ABNORMAL HIGH (ref 9–269)

## 2015-09-30 MED ORDER — SODIUM CHLORIDE 0.9 % IV SOLN
Freq: Once | INTRAVENOUS | Status: AC
  Administered 2015-09-30: 13:00:00 via INTRAVENOUS
  Filled 2015-09-30: qty 5

## 2015-09-30 MED ORDER — PALONOSETRON HCL INJECTION 0.25 MG/5ML
0.2500 mg | Freq: Once | INTRAVENOUS | Status: AC
  Administered 2015-09-30: 0.25 mg via INTRAVENOUS

## 2015-09-30 MED ORDER — CYANOCOBALAMIN 1000 MCG/ML IJ SOLN
1000.0000 ug | Freq: Once | INTRAMUSCULAR | Status: AC
  Administered 2015-09-30: 1000 ug via INTRAMUSCULAR

## 2015-09-30 MED ORDER — CYANOCOBALAMIN 1000 MCG/ML IJ SOLN
INTRAMUSCULAR | Status: AC
  Filled 2015-09-30: qty 1

## 2015-09-30 MED ORDER — PALONOSETRON HCL INJECTION 0.25 MG/5ML
INTRAVENOUS | Status: AC
  Filled 2015-09-30: qty 5

## 2015-09-30 MED ORDER — HYDROMORPHONE HCL 1 MG/ML IJ SOLN
2.0000 mg | Freq: Once | INTRAMUSCULAR | Status: AC
  Administered 2015-09-30: 2 mg via INTRAVENOUS

## 2015-09-30 MED ORDER — AMPICILLIN 500 MG PO CAPS: 500.0000 mg | ORAL_CAPSULE | Freq: Four times a day (QID) | ORAL | Status: DC

## 2015-09-30 MED ORDER — SODIUM CHLORIDE 0.9 % IJ SOLN
10.0000 mL | INTRAMUSCULAR | Status: DC | PRN
  Administered 2015-09-30: 10 mL
  Filled 2015-09-30: qty 10

## 2015-09-30 MED ORDER — SODIUM CHLORIDE 0.9 % IV SOLN
Freq: Once | INTRAVENOUS | Status: AC
  Administered 2015-09-30: 13:00:00 via INTRAVENOUS

## 2015-09-30 MED ORDER — HEPARIN SOD (PORK) LOCK FLUSH 100 UNIT/ML IV SOLN
500.0000 [IU] | Freq: Once | INTRAVENOUS | Status: AC | PRN
  Administered 2015-09-30: 500 [IU]
  Filled 2015-09-30: qty 5

## 2015-09-30 MED ORDER — HYDROMORPHONE HCL 1 MG/ML IJ SOLN
INTRAMUSCULAR | Status: AC
  Filled 2015-09-30: qty 2

## 2015-09-30 MED ORDER — PACLITAXEL PROTEIN-BOUND CHEMO INJECTION 100 MG
100.0000 mg/m2 | Freq: Once | INTRAVENOUS | Status: AC
  Administered 2015-09-30: 225 mg via INTRAVENOUS
  Filled 2015-09-30: qty 45

## 2015-09-30 NOTE — Patient Instructions (Signed)

## 2015-09-30 NOTE — Progress Notes (Signed)
Hematology and Oncology Follow Up Visit  Kerri Vasquez [id number] 1955-06-25 60 y.o. 09/30/2015   Principle Diagnosis:  1. Metastatic breast cancer, progressive liver metastases  2. Gastric bypass for subsequent B12 and iron deficiency 3. Osteoarthritis with chronic pain 4. 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 Abraxane q 21 days s/p cycle7 2. Vitamin B12 1 mg IM monthly 3. IV iron as indicated    Interim History:  [name] is here today. She is feeling much better. This gastroneuritis has resolved. This seemed to resolve spontaneously which I thought it would.  She has decreased pain in the mouth. She has a bad tooth. We will have to get her to dental medicine for an evaluation.  She still is bothered by the back and leg pain. She has severe arthritis in her back. For now, there's been no evidence of metastatic disease to the back.  She's had no diarrhea. His been a little bit of constipation.  There's been no bleeding. He's had no bruising.  Her last CA 27.29 has been going up slowly. In May it was 29.  Her appetite has been good. She is eating fairly well.  Overall, her performance status is ECOG 1.  Medications:    Medication List       This list is accurate as of: 09/30/15 11:38 AM.  Always use your most recent med list.               ampicillin 500 MG capsule  Commonly known as:  PRINCIPEN  Take 1 capsule (500 mg total) by mouth 4 (four) times daily.     carvedilol 6.25 MG tablet  Commonly known as:  COREG  Take 1 tablet (6.25 mg total) by mouth 2 (two) times daily with a meal.     cyanocobalamin 1000 MCG/ML injection  Commonly known as:  (VITAMIN B-12)  INJECT 1 ML AS DIRECTED EVERY 21 DAYS     dexamethasone 4 MG tablet  Commonly known as:  DECADRON  TAKE 2 TABLETS BY MOUTH TWICE DAILY WITH A MEAL STARTING THE DAY AFTER CHEMOTHERAPY FOR 2 DAYS     diphenoxylate-atropine 2.5-0.025 MG tablet    Commonly known as:  LOMOTIL  Take 1 tablet by mouth 4 (four) times daily as needed for diarrhea or loose stools.     fentaNYL 50 MCG/HR  Commonly known as:  DURAGESIC - dosed mcg/hr  Place 2 patches (100 mcg total) onto the skin every other day.     fentaNYL 25 MCG/HR patch  Commonly known as:  DURAGESIC - dosed mcg/hr  Place 1 patch (25 mcg total) onto the skin every other day.     FLUoxetine 40 MG capsule  Commonly known as:  PROZAC  Take 40 mg by mouth daily before breakfast.     furosemide 20 MG tablet  Commonly known as:  LASIX  Take 60 mg by mouth daily.     furosemide 20 MG tablet  Commonly known as:  LASIX  Take 3 tablets (60 mg total) by mouth daily.     levothyroxine 150 MCG tablet  Commonly known as:  SYNTHROID, LEVOTHROID  Take 1 tablet (150 mcg total) by mouth daily.     lidocaine-prilocaine cream  Commonly known as:  EMLA  Apply to affected area once     LORazepam 0.5 MG tablet  Commonly known as:  ATIVAN  PLACE 1 TABLET UNDER TONGUE EVERY 6 HOURS AS NEEDED FOR NAUSEA AND  VOMITING     methocarbamol 750 MG tablet  Commonly known as:  ROBAXIN  Take 750 mg by mouth 4 (four) times daily.     MULTI-VITAMINS Tabs  Take 1 tablet by mouth daily.     nitroGLYCERIN 0.4 MG SL tablet  Commonly known as:  NITROSTAT  Place 1 tablet (0.4 mg total) under the tongue every 5 (five) minutes as needed for chest pain.     ondansetron 8 MG tablet  Commonly known as:  ZOFRAN  Take 1 tablet (8 mg total) by mouth every 8 (eight) hours as needed for nausea or vomiting.     ondansetron 8 MG tablet  Commonly known as:  ZOFRAN  TAKE 1 TABLET BY MOUTH EVERY 8 HOURS AS NEEDED FOR NAUSEA AND VOMITING     oxyCODONE 15 MG immediate release tablet  Commonly known as:  ROXICODONE  Take 1 tablet (15 mg total) by mouth every 4 (four) hours as needed for pain.     pantoprazole 40 MG tablet  Commonly known as:  PROTONIX  Take 1 tablet (40 mg total) by mouth 2 (two) times daily.      polyethylene glycol packet  Commonly known as:  MIRALAX / GLYCOLAX  Take 17 g by mouth 2 (two) times daily.     potassium chloride SA 20 MEQ tablet  Commonly known as:  K-DUR,KLOR-CON  TAKE 2 TABLETS BY MOUTH TWICE DAILY     pregabalin 200 MG capsule  Commonly known as:  LYRICA  TAKE 1 CAPSULE 2 TIMES A DAY     prochlorperazine 10 MG tablet  Commonly known as:  COMPAZINE  TAKE 1 TABLET BY MOUTH EVERY 6 HOURS AS NEEDED FOR NAUSEA OR VOMITING.     promethazine 25 MG tablet  Commonly known as:  PHENERGAN  TAKE 1 TABLET BY MOUTH THREE TIMES DAILY AS NEEDED FOR NAUSEA     temazepam 15 MG capsule  Commonly known as:  RESTORIL  Take 30 mg by mouth at bedtime.     tiZANidine 4 MG tablet  Commonly known as:  ZANAFLEX  Take 4 mg by mouth every 6 (six) hours as needed for muscle spasms.        Allergies:  Allergies  Allergen Reactions  . Influenza Vac Split [Flu Virus Vaccine] Other (See Comments)    Joint stiffness and renal failure  . Ritalin [Methylphenidate Hcl]     "Heart attack"  . Zostavax [Zoster Vaccine Live ([employer])] Other (See Comments)    Joint stiffness, renal failure  . Adhesive [Tape] Itching and Rash    Past Medical History, Surgical history, Social history, and Family History were reviewed and updated.  Review of Systems: All other 10 point review of systems is negative.   Physical Exam:  height is 5\' 6"  (1.676 m) and weight is 251 lb (113.853 kg). Her oral temperature is 98.3 F (36.8 C). Her blood pressure is 116/63 and her pulse is 79. Her respiration is 18.   Wt Readings from Last 3 Encounters:  09/30/15 251 lb (113.853 kg)  09/16/15 239 lb (108.41 kg)  09/09/15 247 lb (112.038 kg)    Obese white female in no obvious distress. Head and neck exam shows no ocular or oral lesions. She does have some gingival inflammation. I do not see any gingival bleeding. There is some tenderness to palpation over her gums, mostly in the mandibular region. She  has no scleral icterus. She has no adenopathy in the neck. She has no palpable  thyroid. Lungs are with decreased at the bases. Cardiac exam regular rate and rhythm with no murmurs, rubs or bruits. Abdomen is obese but soft. She has good bowel sounds. She'll a little bit of a abdominal wall hernia. This is chronic. This reduces easily. There is no palpable hepatomegaly. Extremities shows chronic nonpitting edema of the lower legs. She has decent range of motion of her joints. Skin exam shows no rashes. Neurological exam shows no focal neurological deficits. .  Lab Results  Component Value Date   WBC 7.3 09/30/2015   HGB 10.2* 09/30/2015   HCT 31.9* 09/30/2015   MCV 108* 09/30/2015   PLT 173 09/30/2015   Lab Results  Component Value Date   FERRITIN 799* 09/09/2015   IRON 75 09/09/2015   TIBC 179* 09/09/2015   UIBC 104* 09/09/2015   IRONPCTSAT 42 09/09/2015   Lab Results  Component Value Date   RETICCTPCT 1.0 06/29/2014   RBC 2.96* 09/30/2015   RETICCTABS 32.3 06/29/2014   No results found for: KPAFRELGTCHN, LAMBDASER, KAPLAMBRATIO No results found for: Kandis Cocking, IGMSERUM Lab Results  Component Value Date   TOTALPROTELP 4.7* 01/31/2010   ALBUMINELP 39.4* 01/31/2010   A1GS 12.1* 01/31/2010   A2GS 20.3* 01/31/2010   BETS 6.0 01/31/2010   BETA2SER 6.8* 01/31/2010   GAMS 15.4 01/31/2010   MSPIKE NOT DETECTED 01/31/2010   SPEI  01/31/2010    (NOTE) The possibility of a faint restricted band(s) cannot be completely excluded in the gamma region.  Suggest serum IFE to evaluate possibility, if clinically indicated. (Lab will hold sample one week. Please call Customer Service at [phone] to  add test.) Reviewed by Odis Hollingshead, MD, PhD, FCAP (Electronic Signature on File)     Chemistry      Component Value Date/Time   NA 141 09/30/2015 1049   NA 143 04/21/2015 0530   NA 141 03/18/2015 0918   K 4.6 09/30/2015 1049   K 3.3* 04/21/2015 0530   K 3.8 03/18/2015 0918    CL 107 09/30/2015 1049   CL 111 04/21/2015 0530   CO2 27 09/30/2015 1049   CO2 24 04/21/2015 0530   CO2 26 03/18/2015 0918   BUN 23* 09/30/2015 1049   BUN 10 04/21/2015 0530   BUN 12.5 03/18/2015 0918   CREATININE 0.9 09/30/2015 1049   CREATININE 1.15* 04/21/2015 0530   CREATININE 1.2* 03/18/2015 0918      Component Value Date/Time   CALCIUM 7.8* 09/30/2015 1049   CALCIUM 7.9* 04/21/2015 0530   CALCIUM 9.2 03/18/2015 0918   ALKPHOS 68 09/30/2015 1049   ALKPHOS 60 04/21/2015 0530   ALKPHOS 74 03/18/2015 0918   AST 21 09/30/2015 1049   AST 17 04/21/2015 0530   AST 21 03/18/2015 0918   ALT 23 09/30/2015 1049   ALT 12* 04/21/2015 0530   ALT 11 03/18/2015 0918   BILITOT 0.50 09/30/2015 1049   BILITOT 0.8 04/21/2015 0530   BILITOT 0.57 03/18/2015 0918     Impression and Plan: [name] is 60 year old white female with metastatic breast cancer.   We will move ahead with her eighth cycle of treatment. After this cycle, we will have to repeat her scans to see how things look.   I do worry that given her CA-27-29 going up, that she may have progressive disease.  If she does have progressive disease, it might be difficult to do further treatment given her performance status. However, of the we probably would be able to consider other treatment  options   I spent about 30 minutes with her today.  Volanda Napoleon, MD 6/1/201711:38 AM

## 2015-10-05 ENCOUNTER — Telehealth (HOSPITAL_COMMUNITY): Payer: Self-pay

## 2015-10-05 ENCOUNTER — Other Ambulatory Visit (HOSPITAL_COMMUNITY): Payer: Self-pay | Admitting: Dentistry

## 2015-10-05 NOTE — Telephone Encounter (Signed)
10/05/15        Daughter, [name] called and left msg. on day of appt. 10/05/15 stating pt. had no transportation and would need to call back and reschedule Dental Consult w/Dr. Enrique Sack for another day. Will wait to hear from patient.  LRI

## 2015-10-07 ENCOUNTER — Other Ambulatory Visit: Payer: Medicare Other

## 2015-10-07 ENCOUNTER — Ambulatory Visit: Payer: Medicare Other

## 2015-10-08 ENCOUNTER — Other Ambulatory Visit: Payer: Self-pay | Admitting: Hematology & Oncology

## 2015-10-13 ENCOUNTER — Ambulatory Visit (HOSPITAL_BASED_OUTPATIENT_CLINIC_OR_DEPARTMENT_OTHER): Payer: Medicare Other

## 2015-10-13 ENCOUNTER — Other Ambulatory Visit: Payer: Self-pay | Admitting: *Deleted

## 2015-10-13 ENCOUNTER — Other Ambulatory Visit (HOSPITAL_BASED_OUTPATIENT_CLINIC_OR_DEPARTMENT_OTHER): Payer: Medicare Other

## 2015-10-13 VITALS — BP 112/64 | HR 71 | Temp 98.2°F | Resp 18

## 2015-10-13 DIAGNOSIS — C50911 Malignant neoplasm of unspecified site of right female breast: Secondary | ICD-10-CM

## 2015-10-13 DIAGNOSIS — C50912 Malignant neoplasm of unspecified site of left female breast: Secondary | ICD-10-CM

## 2015-10-13 DIAGNOSIS — M545 Low back pain: Secondary | ICD-10-CM

## 2015-10-13 DIAGNOSIS — C50919 Malignant neoplasm of unspecified site of unspecified female breast: Secondary | ICD-10-CM | POA: Diagnosis present

## 2015-10-13 DIAGNOSIS — C787 Secondary malignant neoplasm of liver and intrahepatic bile duct: Secondary | ICD-10-CM

## 2015-10-13 DIAGNOSIS — K0520 Aggressive periodontitis, unspecified: Secondary | ICD-10-CM

## 2015-10-13 DIAGNOSIS — D510 Vitamin B12 deficiency anemia due to intrinsic factor deficiency: Secondary | ICD-10-CM

## 2015-10-13 DIAGNOSIS — Z5111 Encounter for antineoplastic chemotherapy: Secondary | ICD-10-CM | POA: Diagnosis present

## 2015-10-13 DIAGNOSIS — I150 Renovascular hypertension: Secondary | ICD-10-CM

## 2015-10-13 DIAGNOSIS — D500 Iron deficiency anemia secondary to blood loss (chronic): Secondary | ICD-10-CM

## 2015-10-13 DIAGNOSIS — G8929 Other chronic pain: Secondary | ICD-10-CM

## 2015-10-13 LAB — CMP (CANCER CENTER ONLY)
ALBUMIN: 2.9 g/dL — AB (ref 3.3–5.5)
ALT(SGPT): 18 U/L (ref 10–47)
AST: 22 U/L (ref 11–38)
Alkaline Phosphatase: 58 U/L (ref 26–84)
BUN, Bld: 22 mg/dL (ref 7–22)
CALCIUM: 7.5 mg/dL — AB (ref 8.0–10.3)
CHLORIDE: 105 meq/L (ref 98–108)
CO2: 30 meq/L (ref 18–33)
CREATININE: 1.2 mg/dL (ref 0.6–1.2)
Glucose, Bld: 106 mg/dL (ref 73–118)
POTASSIUM: 4.5 meq/L (ref 3.3–4.7)
SODIUM: 141 meq/L (ref 128–145)
Total Bilirubin: 0.6 mg/dl (ref 0.20–1.60)
Total Protein: 5.9 g/dL — ABNORMAL LOW (ref 6.4–8.1)

## 2015-10-13 LAB — CBC WITH DIFFERENTIAL (CANCER CENTER ONLY)
BASO#: 0 10*3/uL (ref 0.0–0.2)
BASO%: 0 % (ref 0.0–2.0)
EOS ABS: 0 10*3/uL (ref 0.0–0.5)
EOS%: 0.2 % (ref 0.0–7.0)
HCT: 30.5 % — ABNORMAL LOW (ref 34.8–46.6)
HEMOGLOBIN: 9.8 g/dL — AB (ref 11.6–15.9)
LYMPH#: 0.8 10*3/uL — ABNORMAL LOW (ref 0.9–3.3)
LYMPH%: 12.9 % — AB (ref 14.0–48.0)
MCH: 34.4 pg — AB (ref 26.0–34.0)
MCHC: 32.1 g/dL (ref 32.0–36.0)
MCV: 107 fL — AB (ref 81–101)
MONO#: 0.2 10*3/uL (ref 0.1–0.9)
MONO%: 4.1 % (ref 0.0–13.0)
NEUT%: 82.8 % — ABNORMAL HIGH (ref 39.6–80.0)
NEUTROS ABS: 4.8 10*3/uL (ref 1.5–6.5)
Platelets: 208 10*3/uL (ref 145–400)
RBC: 2.85 10*6/uL — ABNORMAL LOW (ref 3.70–5.32)
RDW: 13.5 % (ref 11.1–15.7)
WBC: 5.8 10*3/uL (ref 3.9–10.0)

## 2015-10-13 MED ORDER — HYDROMORPHONE HCL 1 MG/ML IJ SOLN: 2.0000 mg | Freq: Once | INTRAMUSCULAR | Status: DC

## 2015-10-13 MED ORDER — HYDROMORPHONE HCL PF 1 MG/ML IJ SOLN: 2.0000 mg | INTRAMUSCULAR | Status: DC | PRN

## 2015-10-13 MED ORDER — OXYCODONE HCL 15 MG PO TABS: 15.0000 mg | ORAL_TABLET | ORAL | Status: DC | PRN

## 2015-10-13 MED ORDER — FENTANYL 25 MCG/HR TD PT72
25.0000 ug | MEDICATED_PATCH | TRANSDERMAL | Status: DC
Start: 2015-10-13 — End: 2015-11-22

## 2015-10-13 MED ORDER — SODIUM CHLORIDE 0.9 % IV SOLN
Freq: Once | INTRAVENOUS | Status: AC
  Administered 2015-10-13: 13:00:00 via INTRAVENOUS
  Filled 2015-10-13: qty 5

## 2015-10-13 MED ORDER — HYDROMORPHONE HCL PF 1 MG/ML IJ SOLN: 2.0000 mg | Freq: Once | INTRAMUSCULAR | Status: DC

## 2015-10-13 MED ORDER — HYDROMORPHONE HCL 1 MG/ML IJ SOLN
2.0000 mg | Freq: Once | INTRAMUSCULAR | Status: AC
  Administered 2015-10-13: 2 mg via INTRAVENOUS

## 2015-10-13 MED ORDER — FENTANYL 50 MCG/HR TD PT72
100.0000 ug | MEDICATED_PATCH | TRANSDERMAL | Status: DC
Start: 2015-10-13 — End: 2015-11-22

## 2015-10-13 MED ORDER — PALONOSETRON HCL INJECTION 0.25 MG/5ML
0.2500 mg | Freq: Once | INTRAVENOUS | Status: AC
  Administered 2015-10-13: 0.25 mg via INTRAVENOUS

## 2015-10-13 MED ORDER — SODIUM CHLORIDE 0.9 % IJ SOLN
10.0000 mL | INTRAMUSCULAR | Status: DC | PRN
  Administered 2015-10-13: 10 mL
  Filled 2015-10-13: qty 10

## 2015-10-13 MED ORDER — DIPHENOXYLATE-ATROPINE 2.5-0.025 MG PO TABS: 1.0000 | ORAL_TABLET | Freq: Four times a day (QID) | ORAL | Status: DC | PRN

## 2015-10-13 MED ORDER — PREGABALIN 200 MG PO CAPS: ORAL_CAPSULE | ORAL | Status: DC

## 2015-10-13 MED ORDER — HYDROMORPHONE HCL 1 MG/ML IJ SOLN
INTRAMUSCULAR | Status: AC
  Filled 2015-10-13: qty 2

## 2015-10-13 MED ORDER — PACLITAXEL PROTEIN-BOUND CHEMO INJECTION 100 MG
100.0000 mg/m2 | Freq: Once | INTRAVENOUS | Status: AC
  Administered 2015-10-13: 225 mg via INTRAVENOUS
  Filled 2015-10-13: qty 45

## 2015-10-13 MED ORDER — HEPARIN SOD (PORK) LOCK FLUSH 100 UNIT/ML IV SOLN
500.0000 [IU] | Freq: Once | INTRAVENOUS | Status: AC | PRN
  Administered 2015-10-13: 500 [IU]
  Filled 2015-10-13: qty 5

## 2015-10-13 MED ORDER — PALONOSETRON HCL INJECTION 0.25 MG/5ML
INTRAVENOUS | Status: AC
  Filled 2015-10-13: qty 5

## 2015-10-13 MED ORDER — SODIUM CHLORIDE 0.9 % IV SOLN
Freq: Once | INTRAVENOUS | Status: AC
  Administered 2015-10-13: 13:00:00 via INTRAVENOUS

## 2015-10-13 NOTE — Patient Instructions (Signed)
[facility] Discharge Instructions for Patients Receiving Chemotherapy  Today you received the following chemotherapy agents Abraxane  To help prevent nausea and vomiting after your treatment, we encourage you to take your nausea medication    If you develop nausea and vomiting that is not controlled by your nausea medication, call the clinic.   BELOW ARE SYMPTOMS THAT SHOULD BE REPORTED IMMEDIATELY:  *FEVER GREATER THAN 100.5 F  *CHILLS WITH OR WITHOUT FEVER  NAUSEA AND VOMITING THAT IS NOT CONTROLLED WITH YOUR NAUSEA MEDICATION  *UNUSUAL SHORTNESS OF BREATH  *UNUSUAL BRUISING OR BLEEDING  TENDERNESS IN MOUTH AND THROAT WITH OR WITHOUT PRESENCE OF ULCERS  *URINARY PROBLEMS  *BOWEL PROBLEMS  UNUSUAL RASH Items with * indicate a potential emergency and should be followed up as soon as possible.  Feel free to call the clinic you have any questions or concerns. The clinic phone number is [phone].  Please show the CHEMO ALERT CARD at check-in to the Emergency Department and triage nurse.   

## 2015-10-17 ENCOUNTER — Other Ambulatory Visit: Payer: Self-pay | Admitting: Hematology & Oncology

## 2015-10-17 ENCOUNTER — Other Ambulatory Visit: Payer: Self-pay | Admitting: Family

## 2015-10-17 ENCOUNTER — Other Ambulatory Visit (HOSPITAL_COMMUNITY): Payer: Self-pay | Admitting: Internal Medicine

## 2015-10-18 ENCOUNTER — Other Ambulatory Visit (HOSPITAL_COMMUNITY): Payer: Self-pay | Admitting: Internal Medicine

## 2015-10-19 ENCOUNTER — Other Ambulatory Visit: Payer: Self-pay | Admitting: Hematology & Oncology

## 2015-10-26 ENCOUNTER — Ambulatory Visit (HOSPITAL_COMMUNITY)
Admission: RE | Admit: 2015-10-26 | Discharge: 2015-10-26 | Disposition: A | Discharge status: Home / Self Care | Payer: Medicare Other | Source: Ambulatory Visit | Attending: Hematology & Oncology | Admitting: Hematology & Oncology

## 2015-10-26 ENCOUNTER — Telehealth: Payer: Self-pay | Admitting: *Deleted

## 2015-10-26 DIAGNOSIS — R590 Localized enlarged lymph nodes: Secondary | ICD-10-CM | POA: Insufficient documentation

## 2015-10-26 DIAGNOSIS — C50919 Malignant neoplasm of unspecified site of unspecified female breast: Secondary | ICD-10-CM | POA: Diagnosis not present

## 2015-10-26 DIAGNOSIS — K0520 Aggressive periodontitis, unspecified: Secondary | ICD-10-CM

## 2015-10-26 DIAGNOSIS — C787 Secondary malignant neoplasm of liver and intrahepatic bile duct: Secondary | ICD-10-CM | POA: Diagnosis present

## 2015-10-26 LAB — GLUCOSE, CAPILLARY: GLUCOSE-CAPILLARY: 96 mg/dL (ref 65–99)

## 2015-10-26 MED ORDER — FLUDEOXYGLUCOSE F - 18 (FDG) INJECTION
12.5000 | Freq: Once | INTRAVENOUS | Status: AC | PRN
  Administered 2015-10-26: 12.5 via INTRAVENOUS

## 2015-10-26 NOTE — Telephone Encounter (Signed)
-----   Message from Kerri Napoleon, MD sent at 10/26/2015  1:21 PM EDT ----- I called her and left a message on her answering machine. I told her that the breast cancer appears to be be progressing. She has some new spots in her liver. As such we will have to make a change in her treatment protocol. We have other options that we can consider for her. I told her that she can call us if she has any questions. Am praying hard for her.

## 2015-10-30 ENCOUNTER — Other Ambulatory Visit: Payer: Self-pay | Admitting: Hematology & Oncology

## 2015-11-01 ENCOUNTER — Encounter: Payer: Self-pay | Admitting: Hematology & Oncology

## 2015-11-04 ENCOUNTER — Encounter: Payer: Self-pay | Admitting: Hematology & Oncology

## 2015-11-09 ENCOUNTER — Other Ambulatory Visit: Payer: Self-pay | Admitting: Hematology & Oncology

## 2015-11-19 ENCOUNTER — Encounter: Payer: Self-pay | Admitting: Hematology & Oncology

## 2015-11-21 ENCOUNTER — Other Ambulatory Visit: Payer: Self-pay | Admitting: Hematology & Oncology

## 2015-11-22 ENCOUNTER — Other Ambulatory Visit: Payer: Self-pay | Admitting: Hematology & Oncology

## 2015-11-22 ENCOUNTER — Encounter: Payer: Self-pay | Admitting: Hematology & Oncology

## 2015-11-22 ENCOUNTER — Other Ambulatory Visit: Payer: Self-pay | Admitting: Nurse Practitioner

## 2015-11-22 DIAGNOSIS — G8929 Other chronic pain: Secondary | ICD-10-CM

## 2015-11-22 DIAGNOSIS — C50912 Malignant neoplasm of unspecified site of left female breast: Secondary | ICD-10-CM

## 2015-11-22 DIAGNOSIS — M545 Low back pain, unspecified: Secondary | ICD-10-CM

## 2015-11-22 DIAGNOSIS — I150 Renovascular hypertension: Secondary | ICD-10-CM

## 2015-11-22 DIAGNOSIS — C50911 Malignant neoplasm of unspecified site of right female breast: Secondary | ICD-10-CM

## 2015-11-22 DIAGNOSIS — C50919 Malignant neoplasm of unspecified site of unspecified female breast: Secondary | ICD-10-CM

## 2015-11-22 DIAGNOSIS — D510 Vitamin B12 deficiency anemia due to intrinsic factor deficiency: Secondary | ICD-10-CM

## 2015-11-22 DIAGNOSIS — C787 Secondary malignant neoplasm of liver and intrahepatic bile duct: Secondary | ICD-10-CM

## 2015-11-22 DIAGNOSIS — D500 Iron deficiency anemia secondary to blood loss (chronic): Secondary | ICD-10-CM

## 2015-11-22 MED ORDER — LORAZEPAM 0.5 MG PO TABS: ORAL_TABLET | ORAL | 0 refills | Status: DC

## 2015-11-22 MED ORDER — FENTANYL 25 MCG/HR TD PT72: 25.0000 ug | MEDICATED_PATCH | TRANSDERMAL | 0 refills | Status: DC

## 2015-11-22 MED ORDER — PROMETHAZINE HCL 25 MG PO TABS: 25.0000 mg | ORAL_TABLET | Freq: Three times a day (TID) | ORAL | 0 refills | Status: DC | PRN

## 2015-11-22 MED ORDER — ONDANSETRON HCL 8 MG PO TABS: 8.0000 mg | ORAL_TABLET | Freq: Three times a day (TID) | ORAL | 2 refills | Status: DC | PRN

## 2015-11-22 MED ORDER — FENTANYL 50 MCG/HR TD PT72: 100.0000 ug | MEDICATED_PATCH | TRANSDERMAL | 0 refills | Status: DC

## 2015-11-23 ENCOUNTER — Other Ambulatory Visit: Payer: Self-pay | Admitting: *Deleted

## 2015-11-23 ENCOUNTER — Ambulatory Visit: Payer: Medicare Other | Admitting: Hematology & Oncology

## 2015-11-23 ENCOUNTER — Other Ambulatory Visit: Payer: Medicare Other

## 2015-11-23 MED ORDER — OXYCODONE HCL 15 MG PO TABS: 15.0000 mg | ORAL_TABLET | ORAL | 0 refills | Status: DC | PRN

## 2015-11-23 MED FILL — oxyCODONE HCL 15 MG TABS: 15 | 30 days supply | Qty: 180 | Fill #0

## 2015-11-23 MED FILL — fentaNYL 25 MCG/HR PT72: 25 | 30 days supply | Qty: 15 | Fill #0

## 2015-11-23 MED FILL — fentaNYL 50 MCG/HR PT72: 50 | 30 days supply | Qty: 30 | Fill #0

## 2015-11-29 ENCOUNTER — Other Ambulatory Visit: Payer: Self-pay | Admitting: Hematology & Oncology

## 2015-12-01 ENCOUNTER — Other Ambulatory Visit: Payer: Self-pay | Admitting: Nurse Practitioner

## 2015-12-01 DIAGNOSIS — C50919 Malignant neoplasm of unspecified site of unspecified female breast: Secondary | ICD-10-CM

## 2015-12-01 DIAGNOSIS — C787 Secondary malignant neoplasm of liver and intrahepatic bile duct: Principal | ICD-10-CM

## 2015-12-02 ENCOUNTER — Ambulatory Visit (HOSPITAL_BASED_OUTPATIENT_CLINIC_OR_DEPARTMENT_OTHER): Payer: Medicare Other | Admitting: Hematology & Oncology

## 2015-12-02 ENCOUNTER — Ambulatory Visit (HOSPITAL_BASED_OUTPATIENT_CLINIC_OR_DEPARTMENT_OTHER): Payer: Medicare Other

## 2015-12-02 ENCOUNTER — Other Ambulatory Visit (HOSPITAL_BASED_OUTPATIENT_CLINIC_OR_DEPARTMENT_OTHER): Payer: Medicare Other

## 2015-12-02 ENCOUNTER — Encounter: Payer: Self-pay | Admitting: Hematology & Oncology

## 2015-12-02 VITALS — BP 126/65 | HR 74 | Temp 97.6°F | Resp 18 | Ht 66.0 in | Wt 261.0 lb

## 2015-12-02 DIAGNOSIS — D500 Iron deficiency anemia secondary to blood loss (chronic): Secondary | ICD-10-CM | POA: Diagnosis not present

## 2015-12-02 DIAGNOSIS — C787 Secondary malignant neoplasm of liver and intrahepatic bile duct: Secondary | ICD-10-CM

## 2015-12-02 DIAGNOSIS — D510 Vitamin B12 deficiency anemia due to intrinsic factor deficiency: Secondary | ICD-10-CM

## 2015-12-02 DIAGNOSIS — C50919 Malignant neoplasm of unspecified site of unspecified female breast: Secondary | ICD-10-CM | POA: Diagnosis not present

## 2015-12-02 DIAGNOSIS — E538 Deficiency of other specified B group vitamins: Secondary | ICD-10-CM | POA: Diagnosis not present

## 2015-12-02 LAB — CBC WITH DIFFERENTIAL (CANCER CENTER ONLY)
BASO#: 0 10*3/uL (ref 0.0–0.2)
BASO%: 0.3 % (ref 0.0–2.0)
EOS%: 0 % (ref 0.0–7.0)
Eosinophils Absolute: 0 10*3/uL (ref 0.0–0.5)
HEMATOCRIT: 40.3 % (ref 34.8–46.6)
HEMOGLOBIN: 13.2 g/dL (ref 11.6–15.9)
LYMPH#: 0.8 10*3/uL — AB (ref 0.9–3.3)
LYMPH%: 7 % — ABNORMAL LOW (ref 14.0–48.0)
MCH: 34.2 pg — ABNORMAL HIGH (ref 26.0–34.0)
MCHC: 32.8 g/dL (ref 32.0–36.0)
MCV: 104 fL — AB (ref 81–101)
MONO#: 0.4 10*3/uL (ref 0.1–0.9)
MONO%: 4 % (ref 0.0–13.0)
NEUT%: 88.7 % — AB (ref 39.6–80.0)
NEUTROS ABS: 9.9 10*3/uL — AB (ref 1.5–6.5)
Platelets: 122 10*3/uL — ABNORMAL LOW (ref 145–400)
RBC: 3.86 10*6/uL (ref 3.70–5.32)
RDW: 14.5 % (ref 11.1–15.7)
WBC: 11.1 10*3/uL — ABNORMAL HIGH (ref 3.9–10.0)

## 2015-12-02 LAB — COMPREHENSIVE METABOLIC PANEL
ALBUMIN: 3.3 g/dL — AB (ref 3.5–5.0)
ALK PHOS: 64 U/L (ref 40–150)
ALT: 36 U/L (ref 0–55)
ANION GAP: 12 meq/L — AB (ref 3–11)
AST: 18 U/L (ref 5–34)
BUN: 44.5 mg/dL — AB (ref 7.0–26.0)
CALCIUM: 8.6 mg/dL (ref 8.4–10.4)
CO2: 19 mEq/L — ABNORMAL LOW (ref 22–29)
CREATININE: 1.3 mg/dL — AB (ref 0.6–1.1)
Chloride: 112 mEq/L — ABNORMAL HIGH (ref 98–109)
EGFR: 46 mL/min/{1.73_m2} — ABNORMAL LOW (ref >90)
Glucose: 112 mg/dl (ref 70–140)
POTASSIUM: 4.5 meq/L (ref 3.5–5.1)
Sodium: 144 mEq/L (ref 136–145)
Total Bilirubin: 0.35 mg/dL (ref 0.20–1.20)
Total Protein: 6.3 g/dL — ABNORMAL LOW (ref 6.4–8.3)

## 2015-12-02 LAB — TECHNOLOGIST REVIEW CHCC SATELLITE

## 2015-12-02 MED ORDER — CYANOCOBALAMIN 1000 MCG/ML IJ SOLN
INTRAMUSCULAR | Status: AC
  Filled 2015-12-02: qty 1

## 2015-12-02 MED ORDER — CAPECITABINE 500 MG PO TABS: ORAL_TABLET | ORAL | 3 refills | Status: DC

## 2015-12-02 MED ORDER — CAPECITABINE 500 MG PO TABS: 1000.0000 mg/m2 | ORAL_TABLET | Freq: Two times a day (BID) | ORAL | 3 refills | Status: DC

## 2015-12-02 MED ORDER — CYANOCOBALAMIN 1000 MCG/ML IJ SOLN
1000.0000 ug | Freq: Once | INTRAMUSCULAR | Status: AC
  Administered 2015-12-02: 1000 ug via INTRAMUSCULAR

## 2015-12-02 MED FILL — CAPECITABINE 500 MG TABLET: 500 | 14 days supply | Qty: 140 | Fill #0

## 2015-12-02 NOTE — Patient Instructions (Signed)

## 2015-12-02 NOTE — Progress Notes (Signed)
Hematology and Oncology Follow Up Visit  [employer] [id number] Nov 04, 1955 60 y.o. 12/02/2015   Principle Diagnosis:  1. Metastatic breast cancer, progressive liver metastases  2. Gastric bypass for subsequent B12 and iron deficiency 3. Osteoarthritis with chronic pain 4. 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 Abraxane q 21 days s/p cycle 8 - d/c'ed due to progression 2. Vitamin B12 1 mg IM monthly 3. IV iron as indicated 4.  Xeloda 2500 mg po BID (14/7) - start next week    Interim History:  Ms. Marella Bile is here today.  Unfortunately, her cancer progressed. She was on Abraxane. She tolerated this pretty well.  We did repeat a PET scan on her. This was done a few weeks ago. This showed progression of her hepatic metastasis with several new small lesions. There was some small hypermetabolic periportal lymph nodes. Otherwise, everything looked okay.  Her CA 27.29 back in May was 74.3.  She actually looks fairly good. She still has a chronic pain issues which she will always have because of severe back problems.  Her appetite is doing okay. Her daughter, despite her limitations, is doing a great job helping out Ms. Mahjouba.  She's had no bleeding or bruising. She does have iron deficiency. She just has very poor absorption. She's had gastric bypass.  She's had no leg swelling. She has bad arthritis in her knees.  Overall, her pain control seems to go pretty well.  I'll had to say that her performance status probably is ECOG 1-2.   Medications:    Medication List       Accurate as of 12/02/15  5:56 PM. Always use your most recent med list.          capecitabine 500 MG tablet Commonly known as:  XELODA Take 5 capsules TWICE a day with meals for 14 days.   carvedilol 6.25 MG tablet Commonly known as:  COREG Take 1 tablet (6.25 mg total) by mouth 2 (two) times daily with a meal.   cyanocobalamin 1000 MCG/ML  injection Commonly known as:  (VITAMIN B-12) INJECT 1 ML AS DIRECTED EVERY 21 DAYS   dexamethasone 4 MG tablet Commonly known as:  DECADRON TAKE 2 TABLETS BY MOUTH TWICE DAILY WITH A MEAL STARTING THE DAY AFTER CHEMOTHERAPY FOR 2 DAYS   diphenoxylate-atropine 2.5-0.025 MG tablet Commonly known as:  LOMOTIL Take 1 tablet by mouth 4 (four) times daily as needed for diarrhea or loose stools.   fentaNYL 25 MCG/HR patch Commonly known as:  DURAGESIC - dosed mcg/hr Place 1 patch (25 mcg total) onto the skin every other day.   fentaNYL 50 MCG/HR Commonly known as:  DURAGESIC - dosed mcg/hr Place 2 patches (100 mcg total) onto the skin every other day.   FLUoxetine 40 MG capsule Commonly known as:  PROZAC Take 40 mg by mouth daily before breakfast.   furosemide 20 MG tablet Commonly known as:  LASIX Take 60 mg by mouth daily.   levothyroxine 150 MCG tablet Commonly known as:  SYNTHROID, LEVOTHROID Take 1 tablet (150 mcg total) by mouth daily.   lidocaine-prilocaine cream Commonly known as:  EMLA Apply to affected area once   LORazepam 0.5 MG tablet Commonly known as:  ATIVAN PLACE 1 TABLET UNDER TONGUE EVERY 6 HOURS AS NEEDED FOR NAUSEA AND VOMITING   LYRICA 200 MG capsule Generic drug:  pregabalin TAKE ONE CAPSULE BY MOUTH TWICE DAILY   methocarbamol 750 MG tablet Commonly known  as:  ROBAXIN TAKE 1 TABLET BY MOUTH THREE TIMES DAILY AS NEEDED   MULTI-VITAMINS Tabs Take 1 tablet by mouth daily.   nitroGLYCERIN 0.4 MG SL tablet Commonly known as:  NITROSTAT Place 1 tablet (0.4 mg total) under the tongue every 5 (five) minutes as needed for chest pain.   ondansetron 8 MG tablet Commonly known as:  ZOFRAN Take 1 tablet (8 mg total) by mouth every 8 (eight) hours as needed for nausea or vomiting.   oxyCODONE 15 MG immediate release tablet Commonly known as:  ROXICODONE Take 1 tablet (15 mg total) by mouth every 4 (four) hours as needed for pain.   pantoprazole 40 MG  tablet Commonly known as:  PROTONIX Take 1 tablet (40 mg total) by mouth 2 (two) times daily.   polyethylene glycol packet Commonly known as:  MIRALAX / GLYCOLAX Take 17 g by mouth 2 (two) times daily.   potassium chloride SA 20 MEQ tablet Commonly known as:  K-DUR,KLOR-CON TAKE 2 TABLETS BY MOUTH TWICE DAILY   prochlorperazine 10 MG tablet Commonly known as:  COMPAZINE TAKE 1 TABLET BY MOUTH EVERY 6 HOURS AS NEEDED FOR NAUSEA AND VOMITING   promethazine 25 MG tablet Commonly known as:  PHENERGAN TAKE 1 TABLET BY MOUTH THREE TIMES DAILY AS NEEDED FOR NAUSEA.   promethazine 25 MG tablet Commonly known as:  PHENERGAN Take 1 tablet (25 mg total) by mouth every 8 (eight) hours as needed for nausea or vomiting.   temazepam 15 MG capsule Commonly known as:  RESTORIL Take 30 mg by mouth at bedtime.   tiZANidine 4 MG capsule Commonly known as:  ZANAFLEX TAKE 1 CAPSULE BY MOUTH EVERY 8 HOURS       Allergies:  Allergies  Allergen Reactions  . Influenza Vac Split [Flu Virus Vaccine] Other (See Comments)    Joint stiffness and renal failure  . Ritalin [Methylphenidate Hcl]     "Heart attack"  . Zostavax [Zoster Vaccine Live ([employer])] Other (See Comments)    Joint stiffness, renal failure  . Adhesive [Tape] Itching and Rash    Past Medical History, Surgical history, Social history, and Family History were reviewed and updated.  Review of Systems: All other 10 point review of systems is negative.   Physical Exam:  height is 5\' 6"  (1.676 m) and weight is 261 lb (118.4 kg). Her oral temperature is 97.6 F (36.4 C). Her blood pressure is 126/65 and her pulse is 74. Her respiration is 18.   Wt Readings from Last 3 Encounters:  12/02/15 261 lb (118.4 kg)  09/30/15 251 lb (113.9 kg)  09/16/15 239 lb (108.4 kg)    Obese white female in no obvious distress. Head and neck exam shows no ocular or oral lesions. She does have some gingival inflammation. I do not see any  gingival bleeding. There is some tenderness to palpation over her gums, mostly in the mandibular region. She has no scleral icterus. She has no adenopathy in the neck. She has no palpable thyroid. Lungs are with decreased at the bases. Cardiac exam regular rate and rhythm with no murmurs, rubs or bruits. Abdomen is obese but soft. She has good bowel sounds. She'll a little bit of a abdominal wall hernia. This is chronic. This reduces easily. There is no palpable hepatomegaly. Extremities shows chronic nonpitting edema of the lower legs. She has decent range of motion of her joints. Skin exam shows no rashes. Neurological exam shows no focal neurological deficits. .  Lab Results  Component Value Date   WBC 11.1 (H) 12/02/2015   HGB 13.2 12/02/2015   HCT 40.3 12/02/2015   MCV 104 (H) 12/02/2015   PLT 122 (L) 12/02/2015   Lab Results  Component Value Date   FERRITIN 875 (H) 09/30/2015   IRON 74 09/30/2015   TIBC 153 (L) 09/30/2015   UIBC 79 (L) 09/30/2015   IRONPCTSAT 48 09/30/2015   Lab Results  Component Value Date   RETICCTPCT 1.0 06/29/2014   RBC 3.86 12/02/2015   RETICCTABS 32.3 06/29/2014   No results found for: KPAFRELGTCHN, LAMBDASER, KAPLAMBRATIO No results found for: Kandis Cocking, IGMSERUM Lab Results  Component Value Date   TOTALPROTELP 4.7 (L) 01/31/2010   ALBUMINELP 39.4 (L) 01/31/2010   A1GS 12.1 (H) 01/31/2010   A2GS 20.3 (H) 01/31/2010   BETS 6.0 01/31/2010   BETA2SER 6.8 (H) 01/31/2010   GAMS 15.4 01/31/2010   MSPIKE NOT DETECTED 01/31/2010   SPEI  01/31/2010    (NOTE) The possibility of a faint restricted band(s) cannot be completely excluded in the gamma region.  Suggest serum IFE to evaluate possibility, if clinically indicated. (Lab will hold sample one week. Please call Customer Service at [phone] to  add test.) Reviewed by Odis Hollingshead, MD, PhD, FCAP (Electronic Signature on File)     Chemistry      Component Value Date/Time   NA 144  12/02/2015 1355   K 4.5 12/02/2015 1355   CL 105 10/13/2015 1141   CO2 19 (L) 12/02/2015 1355   BUN 44.5 (H) 12/02/2015 1355   CREATININE 1.3 (H) 12/02/2015 1355      Component Value Date/Time   CALCIUM 8.6 12/02/2015 1355   ALKPHOS 64 12/02/2015 1355   AST 18 12/02/2015 1355   ALT 36 12/02/2015 1355   BILITOT 0.35 12/02/2015 1355     Impression and Plan: [name] is 60 year old white female with metastatic breast cancer.   We clearly have to change therapy. I think that we might consider Xeloda. I think Xeloda would be a very good idea for her. I think she would tolerate Xeloda. I would not give her full dose Xeloda.  I talked to she and her daughter about this. [name] is willing to try Xeloda.  I would give her 4 cycles and then repeat her PET scan. Her CA 123456 will certainly help [place].  We still have to watch her vitamin B-12 and iron levels. She will get a B-12 shot today.  Hopefully, they will be only get the Xeloda and not have to take a lot of money for it.  I will plan to see her back in 3 weeks.   I spent about 30 minutes with her and her daughter today.  Volanda Napoleon, MD 8/3/20175:56 PM

## 2015-12-03 LAB — CANCER ANTIGEN 27.29: CA 27.29: 291.5 U/mL — ABNORMAL HIGH (ref 0.0–38.6)

## 2015-12-03 LAB — LACTATE DEHYDROGENASE: LDH: 347 U/L — AB (ref 125–245)

## 2015-12-04 ENCOUNTER — Other Ambulatory Visit: Payer: Self-pay | Admitting: Hematology & Oncology

## 2015-12-06 ENCOUNTER — Ambulatory Visit: Payer: Medicare Other | Admitting: Hematology & Oncology

## 2015-12-06 ENCOUNTER — Other Ambulatory Visit: Payer: Medicare Other

## 2015-12-09 ENCOUNTER — Other Ambulatory Visit: Payer: Self-pay | Admitting: Hematology & Oncology

## 2015-12-13 ENCOUNTER — Other Ambulatory Visit: Payer: Self-pay | Admitting: Hematology & Oncology

## 2015-12-13 ENCOUNTER — Other Ambulatory Visit: Payer: Self-pay | Admitting: Emergency Medicine

## 2015-12-13 NOTE — Telephone Encounter (Signed)
Prescription for Lomotil and Lyrica faxed to [facility] per patient's daughters request. Notified patient's daughter of prescriptions being sent to pharmacy. Daughter states that patient has been experiencing a lot of "watery diarrhea" since starting the Xeloda. Per Dr Marin Olp; instructed patient to hold Xeloda today and restart on 8/17 for 4 tablets BID until 8/22.

## 2015-12-21 ENCOUNTER — Other Ambulatory Visit: Payer: Self-pay | Admitting: Hematology & Oncology

## 2015-12-23 ENCOUNTER — Encounter: Payer: Self-pay | Admitting: Hematology & Oncology

## 2015-12-23 ENCOUNTER — Other Ambulatory Visit (HOSPITAL_BASED_OUTPATIENT_CLINIC_OR_DEPARTMENT_OTHER): Payer: Medicare Other

## 2015-12-23 ENCOUNTER — Ambulatory Visit (HOSPITAL_BASED_OUTPATIENT_CLINIC_OR_DEPARTMENT_OTHER): Payer: Medicare Other | Admitting: Hematology & Oncology

## 2015-12-23 VITALS — BP 145/96 | HR 93 | Temp 98.0°F | Resp 18 | Ht 66.0 in | Wt 273.0 lb

## 2015-12-23 DIAGNOSIS — G4701 Insomnia due to medical condition: Secondary | ICD-10-CM

## 2015-12-23 DIAGNOSIS — C50919 Malignant neoplasm of unspecified site of unspecified female breast: Secondary | ICD-10-CM

## 2015-12-23 DIAGNOSIS — C50911 Malignant neoplasm of unspecified site of right female breast: Secondary | ICD-10-CM

## 2015-12-23 DIAGNOSIS — D500 Iron deficiency anemia secondary to blood loss (chronic): Secondary | ICD-10-CM | POA: Diagnosis present

## 2015-12-23 DIAGNOSIS — M545 Low back pain: Secondary | ICD-10-CM

## 2015-12-23 DIAGNOSIS — G8929 Other chronic pain: Secondary | ICD-10-CM

## 2015-12-23 DIAGNOSIS — D509 Iron deficiency anemia, unspecified: Secondary | ICD-10-CM

## 2015-12-23 DIAGNOSIS — C50912 Malignant neoplasm of unspecified site of left female breast: Secondary | ICD-10-CM

## 2015-12-23 DIAGNOSIS — C787 Secondary malignant neoplasm of liver and intrahepatic bile duct: Principal | ICD-10-CM

## 2015-12-23 DIAGNOSIS — I150 Renovascular hypertension: Secondary | ICD-10-CM

## 2015-12-23 DIAGNOSIS — D510 Vitamin B12 deficiency anemia due to intrinsic factor deficiency: Secondary | ICD-10-CM

## 2015-12-23 LAB — CBC WITH DIFFERENTIAL (CANCER CENTER ONLY)
BASO#: 0 10*3/uL (ref 0.0–0.2)
BASO%: 0.1 % (ref 0.0–2.0)
EOS%: 0.1 % (ref 0.0–7.0)
Eosinophils Absolute: 0 10*3/uL (ref 0.0–0.5)
HCT: 39.4 % (ref 34.8–46.6)
HGB: 13.6 g/dL (ref 11.6–15.9)
LYMPH#: 1.1 10*3/uL (ref 0.9–3.3)
LYMPH%: 12.6 % — AB (ref 14.0–48.0)
MCH: 34.5 pg — ABNORMAL HIGH (ref 26.0–34.0)
MCHC: 34.5 g/dL (ref 32.0–36.0)
MCV: 100 fL (ref 81–101)
MONO#: 0.2 10*3/uL (ref 0.1–0.9)
MONO%: 2.7 % (ref 0.0–13.0)
NEUT#: 7.2 10*3/uL — ABNORMAL HIGH (ref 1.5–6.5)
NEUT%: 84.5 % — ABNORMAL HIGH (ref 39.6–80.0)
PLATELETS: 151 10*3/uL (ref 145–400)
RBC: 3.94 10*6/uL (ref 3.70–5.32)
RDW: 14.8 % (ref 11.1–15.7)
WBC: 8.5 10*3/uL (ref 3.9–10.0)

## 2015-12-23 LAB — COMPREHENSIVE METABOLIC PANEL
ALT: 14 U/L (ref 0–55)
ANION GAP: 9 meq/L (ref 3–11)
AST: 14 U/L (ref 5–34)
Albumin: 3.1 g/dL — ABNORMAL LOW (ref 3.5–5.0)
Alkaline Phosphatase: 64 U/L (ref 40–150)
BUN: 25.6 mg/dL (ref 7.0–26.0)
CALCIUM: 8.1 mg/dL — AB (ref 8.4–10.4)
CHLORIDE: 112 meq/L — AB (ref 98–109)
CO2: 20 mEq/L — ABNORMAL LOW (ref 22–29)
Creatinine: 1.1 mg/dL (ref 0.6–1.1)
EGFR: 56 mL/min/{1.73_m2} — AB (ref >90)
Glucose: 148 mg/dl — ABNORMAL HIGH (ref 70–140)
POTASSIUM: 4.2 meq/L (ref 3.5–5.1)
Sodium: 142 mEq/L (ref 136–145)
Total Bilirubin: 0.46 mg/dL (ref 0.20–1.20)
Total Protein: 6.2 g/dL — ABNORMAL LOW (ref 6.4–8.3)

## 2015-12-23 LAB — IRON AND TIBC
%SAT: 75 % — ABNORMAL HIGH (ref 21–57)
IRON: 192 ug/dL — AB (ref 41–142)
TIBC: 256 ug/dL (ref 236–444)
UIBC: 64 ug/dL — AB (ref 120–384)

## 2015-12-23 LAB — FERRITIN: Ferritin: 761 ng/ml — ABNORMAL HIGH (ref 9–269)

## 2015-12-23 MED ORDER — SUVOREXANT 20 MG PO TABS: 20.0000 mg | ORAL_TABLET | Freq: Every evening | ORAL | 0 refills | Status: DC | PRN

## 2015-12-23 MED ORDER — FENTANYL 25 MCG/HR TD PT72: 25.0000 ug | MEDICATED_PATCH | TRANSDERMAL | 0 refills | Status: DC

## 2015-12-23 MED ORDER — OXYCODONE HCL 15 MG PO TABS: 15.0000 mg | ORAL_TABLET | ORAL | 0 refills | Status: DC | PRN

## 2015-12-23 MED ORDER — FENTANYL 50 MCG/HR TD PT72: 100.0000 ug | MEDICATED_PATCH | TRANSDERMAL | 0 refills | Status: DC

## 2015-12-23 MED ORDER — CAPECITABINE 500 MG PO TABS: ORAL_TABLET | ORAL | 3 refills | Status: DC

## 2015-12-23 MED FILL — fentaNYL 25 MCG/HR PT72: 25 | 30 days supply | Qty: 15 | Fill #0

## 2015-12-23 MED FILL — BELSOMRA 20 MG TABLET: 20 | 30 days supply | Qty: 30 | Fill #0

## 2015-12-23 MED FILL — oxyCODONE HCL 15 MG TABS: 15 | 30 days supply | Qty: 180 | Fill #0

## 2015-12-23 MED FILL — fentaNYL 50 MCG/HR PT72: 50 | 30 days supply | Qty: 30 | Fill #0

## 2015-12-29 NOTE — Progress Notes (Signed)
Hematology and Oncology Follow Up Visit  Kerri Vasquez [id number] April 21, 1956 60 y.o. 12/29/2015   Principle Diagnosis:  1. Metastatic breast cancer, progressive liver metastases  2. Gastric bypass for subsequent B12 and iron deficiency 3. Osteoarthritis with chronic pain 4. 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 Abraxane q 21 days s/p cycle 8 - d/c'ed due to progression 2. Vitamin B12 1 mg IM monthly 3. IV iron as indicated 4.  Xeloda 2500 mg po BID (14/7) - start next week    Interim History:  [name] is here today.  Unfortunately, her cancer progressed. She was on Abraxane. She tolerated this pretty well.  We did repeat a PET scan on her. This was done a few weeks ago. This showed progression of her hepatic metastasis with several new small lesions. There was some small hypermetabolic periportal lymph nodes. Otherwise, everything looked okay.  Her CA 27.29 back in early August was 291.. We went ahead and put her on Xeloda. I think this is very reasonable as it is oral.  She actually looks fairly good. She still has a chronic pain issues which she will always have because of severe back problems.  Her appetite is doing okay. Her daughter, despite her limitations, is doing a great job helping out Kerri Vasquez.  She's had no bleeding or bruising. She does have iron deficiency. She just has very poor absorption. She's had gastric bypass.  She's had no leg swelling. She has bad arthritis in her knees.  Overall, her pain control seems to go pretty well.  I'll had to say that her performance status probably is ECOG 1-2.   Medications:    Medication List       Accurate as of 12/23/15 11:59 PM. Always use your most recent med list.          ampicillin 500 MG capsule Commonly known as:  PRINCIPEN TAKE 1 CAPSULE FOUR TIMES DAILY   capecitabine 500 MG tablet Commonly known as:  XELODA Take 4 capsules TWICE a day with  meals for 14 days.   carvedilol 6.25 MG tablet Commonly known as:  COREG Take 1 tablet (6.25 mg total) by mouth 2 (two) times daily with a meal.   cyanocobalamin 1000 MCG/ML injection Commonly known as:  (VITAMIN B-12) INJECT 1 ML AS DIRECTED EVERY 21 DAYS   dexamethasone 4 MG tablet Commonly known as:  DECADRON TAKE 2 TABLETS BY MOUTH TWICE DAILY WITH A MEAL STARTING THE DAY AFTER CHEMOTHERAPY FOR 2 DAYS.   diphenoxylate-atropine 2.5-0.025 MG tablet Commonly known as:  LOMOTIL TAKE 1 TABLET BY MOUTH FOUR TIMES DAILY AS NEEDED FOR DIARRHEA   fentaNYL 25 MCG/HR patch Commonly known as:  DURAGESIC - dosed mcg/hr Place 1 patch (25 mcg total) onto the skin every other day.   fentaNYL 50 MCG/HR Commonly known as:  DURAGESIC - dosed mcg/hr Place 2 patches (100 mcg total) onto the skin every other day.   FLUoxetine 40 MG capsule Commonly known as:  PROZAC Take 40 mg by mouth daily before breakfast.   furosemide 20 MG tablet Commonly known as:  LASIX Take 60 mg by mouth daily.   levothyroxine 150 MCG tablet Commonly known as:  SYNTHROID, LEVOTHROID Take 1 tablet (150 mcg total) by mouth daily.   lidocaine-prilocaine cream Commonly known as:  EMLA Apply to affected area once   LORazepam 0.5 MG tablet Commonly known as:  ATIVAN PLACE 1 TABLET UNDER TONGUE EVERY 6 HOURS  AS NEEDED FOR NAUSEA AND VOMITING   LYRICA 200 MG capsule Generic drug:  pregabalin TAKE ONE CAPSULE BY MOUTH TWICE DAILY   methocarbamol 750 MG tablet Commonly known as:  ROBAXIN TAKE 1 TABLET BY MOUTH THREE TIMES DAILY AS NEEDED   MULTI-VITAMINS Tabs Take 1 tablet by mouth daily.   nitroGLYCERIN 0.4 MG SL tablet Commonly known as:  NITROSTAT Place 1 tablet (0.4 mg total) under the tongue every 5 (five) minutes as needed for chest pain.   ondansetron 8 MG tablet Commonly known as:  ZOFRAN Take 1 tablet (8 mg total) by mouth every 8 (eight) hours as needed for nausea or vomiting.   oxyCODONE 15 MG  immediate release tablet Commonly known as:  ROXICODONE Take 1 tablet (15 mg total) by mouth every 4 (four) hours as needed for pain.   pantoprazole 40 MG tablet Commonly known as:  PROTONIX Take 1 tablet (40 mg total) by mouth 2 (two) times daily.   polyethylene glycol packet Commonly known as:  MIRALAX / GLYCOLAX Take 17 g by mouth 2 (two) times daily.   potassium chloride SA 20 MEQ tablet Commonly known as:  K-DUR,KLOR-CON TAKE 2 TABLETS BY MOUTH TWICE DAILY   prochlorperazine 10 MG tablet Commonly known as:  COMPAZINE TAKE 1 TABLET BY MOUTH EVERY 6 HOURS AS NEEDED FOR NAUSEA AND VOMITING   promethazine 25 MG tablet Commonly known as:  PHENERGAN TAKE 1 TABLET BY MOUTH THREE TIMES DAILY AS NEEDED FOR NAUSEA.   promethazine 25 MG tablet Commonly known as:  PHENERGAN Take 1 tablet (25 mg total) by mouth every 8 (eight) hours as needed for nausea or vomiting.   Suvorexant 20 MG Tabs Commonly known as:  BELSOMRA Take 20 mg by mouth at bedtime as needed.   temazepam 15 MG capsule Commonly known as:  RESTORIL Take 30 mg by mouth at bedtime.   tiZANidine 4 MG capsule Commonly known as:  ZANAFLEX TAKE 1 CAPSULE BY MOUTH EVERY 8 HOURS       Allergies:  Allergies  Allergen Reactions  . Influenza Vac Split [Flu Virus Vaccine] Other (See Comments)    Joint stiffness and renal failure  . Ritalin [Methylphenidate Hcl]     "Heart attack"  . Zostavax [Zoster Vaccine Live ([employer])] Other (See Comments)    Joint stiffness, renal failure  . Adhesive [Tape] Itching and Rash    Past Medical History, Surgical history, Social history, and Family History were reviewed and updated.  Review of Systems: All other 10 point review of systems is negative.   Physical Exam:  height is 5\' 6"  (1.676 m) and weight is 273 lb (123.8 kg). Her oral temperature is 98 F (36.7 C). Her blood pressure is 145/96 (abnormal) and her pulse is 93. Her respiration is 18.   Wt Readings from Last 3  Encounters:  12/23/15 273 lb (123.8 kg)  12/02/15 261 lb (118.4 kg)  09/30/15 251 lb (113.9 kg)    Obese white female in no obvious distress. Head and neck exam shows no ocular or oral lesions. She does have some gingival inflammation. I do not see any gingival bleeding. There is some tenderness to palpation over her gums, mostly in the mandibular region. She has no scleral icterus. She has no adenopathy in the neck. She has no palpable thyroid. Lungs are with decreased at the bases. Cardiac exam regular rate and rhythm with no murmurs, rubs or bruits. Abdomen is obese but soft. She has good bowel sounds. She'll a  little bit of a abdominal wall hernia. This is chronic. This reduces easily. There is no palpable hepatomegaly. Extremities shows chronic nonpitting edema of the lower legs. She has decent range of motion of her joints. Skin exam shows no rashes. Neurological exam shows no focal neurological deficits. .  Lab Results  Component Value Date   WBC 8.5 12/23/2015   HGB 13.6 12/23/2015   HCT 39.4 12/23/2015   MCV 100 12/23/2015   PLT 151 12/23/2015   Lab Results  Component Value Date   FERRITIN 761 (H) 12/23/2015   IRON 192 (H) 12/23/2015   TIBC 256 12/23/2015   UIBC 64 (L) 12/23/2015   IRONPCTSAT 75 (H) 12/23/2015   Lab Results  Component Value Date   RETICCTPCT 1.0 06/29/2014   RBC 3.94 12/23/2015   RETICCTABS 32.3 06/29/2014   No results found for: KPAFRELGTCHN, LAMBDASER, KAPLAMBRATIO No results found for: Kandis Cocking, IGMSERUM Lab Results  Component Value Date   TOTALPROTELP 4.7 (L) 01/31/2010   ALBUMINELP 39.4 (L) 01/31/2010   A1GS 12.1 (H) 01/31/2010   A2GS 20.3 (H) 01/31/2010   BETS 6.0 01/31/2010   BETA2SER 6.8 (H) 01/31/2010   GAMS 15.4 01/31/2010   MSPIKE NOT DETECTED 01/31/2010   SPEI  01/31/2010    (NOTE) The possibility of a faint restricted band(s) cannot be completely excluded in the gamma region.  Suggest serum IFE to evaluate possibility, if  clinically indicated. (Lab will hold sample one week. Please call Customer Service at [phone] to  add test.) Reviewed by Odis Hollingshead, MD, PhD, FCAP (Electronic Signature on File)     Chemistry      Component Value Date/Time   NA 142 12/23/2015 1113   K 4.2 12/23/2015 1113   CL 105 10/13/2015 1141   CO2 20 (L) 12/23/2015 1113   BUN 25.6 12/23/2015 1113   CREATININE 1.1 12/23/2015 1113      Component Value Date/Time   CALCIUM 8.1 (L) 12/23/2015 1113   ALKPHOS 64 12/23/2015 1113   AST 14 12/23/2015 1113   ALT 14 12/23/2015 1113   BILITOT 0.46 12/23/2015 1113     Impression and Plan: [name] is 60 year old white female with metastatic breast cancer. She is on Xeloda. She has been doing okay with this.  With her, it is sometimes hard to tell how well she really is doing. She has a lot of other health issues. She has really bad arthritis. So far, his been no bony metastases in her spine or other bones.  It is still too early to know how well she will respond to the Xeloda. We just had to give it some more time.  We will plan to get her back here in another 3 weeks. She comes with her daughter. Her daughter will get iron we see her back.   Volanda Napoleon, MD 8/30/20175:19 PM

## 2015-12-30 ENCOUNTER — Other Ambulatory Visit: Payer: Self-pay | Admitting: Hematology & Oncology

## 2016-01-05 ENCOUNTER — Other Ambulatory Visit: Payer: Self-pay | Admitting: Hematology & Oncology

## 2016-01-06 ENCOUNTER — Other Ambulatory Visit: Payer: Self-pay | Admitting: Hematology & Oncology

## 2016-01-12 ENCOUNTER — Other Ambulatory Visit: Payer: Self-pay | Admitting: Hematology & Oncology

## 2016-01-13 ENCOUNTER — Other Ambulatory Visit: Payer: Self-pay | Admitting: Emergency Medicine

## 2016-01-13 ENCOUNTER — Ambulatory Visit: Payer: Self-pay | Admitting: Hematology & Oncology

## 2016-01-13 ENCOUNTER — Ambulatory Visit: Payer: Self-pay

## 2016-01-13 ENCOUNTER — Other Ambulatory Visit: Payer: Self-pay

## 2016-01-18 ENCOUNTER — Other Ambulatory Visit: Payer: Self-pay | Admitting: Hematology & Oncology

## 2016-01-19 ENCOUNTER — Other Ambulatory Visit: Payer: Self-pay | Admitting: Hematology & Oncology

## 2016-01-19 DIAGNOSIS — C50919 Malignant neoplasm of unspecified site of unspecified female breast: Secondary | ICD-10-CM

## 2016-01-19 DIAGNOSIS — C787 Secondary malignant neoplasm of liver and intrahepatic bile duct: Secondary | ICD-10-CM

## 2016-01-19 DIAGNOSIS — D510 Vitamin B12 deficiency anemia due to intrinsic factor deficiency: Secondary | ICD-10-CM

## 2016-01-19 DIAGNOSIS — C50911 Malignant neoplasm of unspecified site of right female breast: Secondary | ICD-10-CM

## 2016-01-19 DIAGNOSIS — D500 Iron deficiency anemia secondary to blood loss (chronic): Secondary | ICD-10-CM

## 2016-01-19 DIAGNOSIS — C50912 Malignant neoplasm of unspecified site of left female breast: Secondary | ICD-10-CM

## 2016-01-19 DIAGNOSIS — I150 Renovascular hypertension: Secondary | ICD-10-CM

## 2016-01-20 ENCOUNTER — Ambulatory Visit: Payer: Self-pay

## 2016-01-20 ENCOUNTER — Other Ambulatory Visit: Payer: Self-pay | Admitting: Hematology & Oncology

## 2016-01-20 ENCOUNTER — Telehealth: Payer: Self-pay | Admitting: *Deleted

## 2016-01-20 ENCOUNTER — Other Ambulatory Visit: Payer: Self-pay

## 2016-01-20 ENCOUNTER — Other Ambulatory Visit: Payer: Self-pay | Admitting: *Deleted

## 2016-01-20 ENCOUNTER — Ambulatory Visit: Payer: Self-pay | Admitting: Hematology & Oncology

## 2016-01-20 DIAGNOSIS — G8929 Other chronic pain: Secondary | ICD-10-CM

## 2016-01-20 DIAGNOSIS — C787 Secondary malignant neoplasm of liver and intrahepatic bile duct: Secondary | ICD-10-CM

## 2016-01-20 DIAGNOSIS — C50912 Malignant neoplasm of unspecified site of left female breast: Secondary | ICD-10-CM

## 2016-01-20 DIAGNOSIS — C50911 Malignant neoplasm of unspecified site of right female breast: Secondary | ICD-10-CM

## 2016-01-20 DIAGNOSIS — I150 Renovascular hypertension: Secondary | ICD-10-CM

## 2016-01-20 DIAGNOSIS — G4701 Insomnia due to medical condition: Secondary | ICD-10-CM

## 2016-01-20 DIAGNOSIS — M545 Low back pain, unspecified: Secondary | ICD-10-CM

## 2016-01-20 DIAGNOSIS — D500 Iron deficiency anemia secondary to blood loss (chronic): Secondary | ICD-10-CM

## 2016-01-20 DIAGNOSIS — C50919 Malignant neoplasm of unspecified site of unspecified female breast: Secondary | ICD-10-CM

## 2016-01-20 DIAGNOSIS — D510 Vitamin B12 deficiency anemia due to intrinsic factor deficiency: Secondary | ICD-10-CM

## 2016-01-20 MED ORDER — FENTANYL 25 MCG/HR TD PT72: 25.0000 ug | MEDICATED_PATCH | TRANSDERMAL | 0 refills | Status: DC

## 2016-01-20 MED ORDER — FENTANYL 50 MCG/HR TD PT72: 100.0000 ug | MEDICATED_PATCH | TRANSDERMAL | 0 refills | Status: DC

## 2016-01-20 MED ORDER — OXYCODONE HCL 15 MG PO TABS: 15.0000 mg | ORAL_TABLET | ORAL | 0 refills | Status: DC | PRN

## 2016-01-20 MED ORDER — LEVOTHYROXINE SODIUM 150 MCG PO TABS: 150.0000 ug | ORAL_TABLET | Freq: Every day | ORAL | 0 refills | Status: DC

## 2016-01-20 MED ORDER — LORAZEPAM 0.5 MG PO TABS: ORAL_TABLET | ORAL | 0 refills | Status: DC

## 2016-01-20 MED FILL — fentaNYL 50 MCG/HR PT72: 50 | 30 days supply | Qty: 30 | Fill #0

## 2016-01-20 MED FILL — LORazepam 0.5 MG TABS: 0.5 | 22 days supply | Qty: 90 | Fill #0

## 2016-01-20 MED FILL — fentaNYL 25 MCG/HR PT72: 25 | 30 days supply | Qty: 15 | Fill #0

## 2016-01-20 MED FILL — oxyCODONE HCL 15 MG TABS: 15 | 30 days supply | Qty: 180 | Fill #0

## 2016-01-20 NOTE — Telephone Encounter (Signed)
Called Radiology Dept for intrahepatic therapy per dr. Marin Olp request.  Patients name and DOB given.  They will schedule patient and call her.

## 2016-01-25 ENCOUNTER — Other Ambulatory Visit: Payer: Self-pay | Admitting: *Deleted

## 2016-01-25 ENCOUNTER — Encounter: Payer: Self-pay | Admitting: Hematology & Oncology

## 2016-01-25 DIAGNOSIS — C787 Secondary malignant neoplasm of liver and intrahepatic bile duct: Principal | ICD-10-CM

## 2016-01-25 DIAGNOSIS — C50919 Malignant neoplasm of unspecified site of unspecified female breast: Secondary | ICD-10-CM

## 2016-01-26 ENCOUNTER — Other Ambulatory Visit: Payer: Self-pay | Admitting: Hematology & Oncology

## 2016-01-27 ENCOUNTER — Other Ambulatory Visit: Payer: Self-pay

## 2016-01-31 ENCOUNTER — Other Ambulatory Visit: Payer: Self-pay | Admitting: Hematology & Oncology

## 2016-02-02 ENCOUNTER — Emergency Department (HOSPITAL_COMMUNITY): Payer: Medicare Other

## 2016-02-02 ENCOUNTER — Inpatient Hospital Stay (HOSPITAL_COMMUNITY)
Admission: EM | Admit: 2016-02-02 | Discharge: 2016-02-09 | DRG: 375 | Disposition: A | Discharge status: Home Health | Payer: Medicare Other | Attending: Internal Medicine | Admitting: Internal Medicine

## 2016-02-02 ENCOUNTER — Encounter (HOSPITAL_COMMUNITY): Payer: Self-pay | Admitting: Emergency Medicine

## 2016-02-02 DIAGNOSIS — S2231XB Fracture of one rib, right side, initial encounter for open fracture: Secondary | ICD-10-CM

## 2016-02-02 DIAGNOSIS — M25569 Pain in unspecified knee: Secondary | ICD-10-CM

## 2016-02-02 DIAGNOSIS — Z9221 Personal history of antineoplastic chemotherapy: Secondary | ICD-10-CM

## 2016-02-02 DIAGNOSIS — B372 Candidiasis of skin and nail: Secondary | ICD-10-CM | POA: Diagnosis present

## 2016-02-02 DIAGNOSIS — I959 Hypotension, unspecified: Secondary | ICD-10-CM | POA: Diagnosis not present

## 2016-02-02 DIAGNOSIS — E2740 Unspecified adrenocortical insufficiency: Secondary | ICD-10-CM | POA: Diagnosis present

## 2016-02-02 DIAGNOSIS — C787 Secondary malignant neoplasm of liver and intrahepatic bile duct: Secondary | ICD-10-CM | POA: Diagnosis present

## 2016-02-02 DIAGNOSIS — S3991XA Unspecified injury of abdomen, initial encounter: Secondary | ICD-10-CM | POA: Diagnosis not present

## 2016-02-02 DIAGNOSIS — R180 Malignant ascites: Secondary | ICD-10-CM

## 2016-02-02 DIAGNOSIS — Z7401 Bed confinement status: Secondary | ICD-10-CM

## 2016-02-02 DIAGNOSIS — S2231XA Fracture of one rib, right side, initial encounter for closed fracture: Secondary | ICD-10-CM

## 2016-02-02 DIAGNOSIS — C50919 Malignant neoplasm of unspecified site of unspecified female breast: Secondary | ICD-10-CM | POA: Diagnosis not present

## 2016-02-02 DIAGNOSIS — R627 Adult failure to thrive: Secondary | ICD-10-CM

## 2016-02-02 DIAGNOSIS — E038 Other specified hypothyroidism: Secondary | ICD-10-CM

## 2016-02-02 DIAGNOSIS — Z853 Personal history of malignant neoplasm of breast: Secondary | ICD-10-CM

## 2016-02-02 DIAGNOSIS — D696 Thrombocytopenia, unspecified: Secondary | ICD-10-CM | POA: Diagnosis present

## 2016-02-02 DIAGNOSIS — F329 Major depressive disorder, single episode, unspecified: Secondary | ICD-10-CM | POA: Diagnosis present

## 2016-02-02 DIAGNOSIS — I5032 Chronic diastolic (congestive) heart failure: Secondary | ICD-10-CM | POA: Diagnosis present

## 2016-02-02 DIAGNOSIS — I700 Atherosclerosis of aorta: Secondary | ICD-10-CM | POA: Diagnosis not present

## 2016-02-02 DIAGNOSIS — D510 Vitamin B12 deficiency anemia due to intrinsic factor deficiency: Secondary | ICD-10-CM | POA: Diagnosis present

## 2016-02-02 DIAGNOSIS — K219 Gastro-esophageal reflux disease without esophagitis: Secondary | ICD-10-CM

## 2016-02-02 DIAGNOSIS — Z85850 Personal history of malignant neoplasm of thyroid: Secondary | ICD-10-CM

## 2016-02-02 DIAGNOSIS — I110 Hypertensive heart disease with heart failure: Secondary | ICD-10-CM | POA: Diagnosis not present

## 2016-02-02 DIAGNOSIS — S299XXA Unspecified injury of thorax, initial encounter: Secondary | ICD-10-CM | POA: Diagnosis not present

## 2016-02-02 DIAGNOSIS — M797 Fibromyalgia: Secondary | ICD-10-CM | POA: Diagnosis present

## 2016-02-02 DIAGNOSIS — S2239XA Fracture of one rib, unspecified side, initial encounter for closed fracture: Secondary | ICD-10-CM | POA: Diagnosis present

## 2016-02-02 DIAGNOSIS — R531 Weakness: Secondary | ICD-10-CM | POA: Diagnosis not present

## 2016-02-02 DIAGNOSIS — Z9884 Bariatric surgery status: Secondary | ICD-10-CM

## 2016-02-02 DIAGNOSIS — S199XXA Unspecified injury of neck, initial encounter: Secondary | ICD-10-CM | POA: Diagnosis not present

## 2016-02-02 DIAGNOSIS — Z79899 Other long term (current) drug therapy: Secondary | ICD-10-CM

## 2016-02-02 DIAGNOSIS — E039 Hypothyroidism, unspecified: Secondary | ICD-10-CM | POA: Diagnosis present

## 2016-02-02 DIAGNOSIS — Z6841 Body Mass Index (BMI) 40.0 and over, adult: Secondary | ICD-10-CM

## 2016-02-02 DIAGNOSIS — C786 Secondary malignant neoplasm of retroperitoneum and peritoneum: Secondary | ICD-10-CM | POA: Diagnosis not present

## 2016-02-02 DIAGNOSIS — K909 Intestinal malabsorption, unspecified: Secondary | ICD-10-CM

## 2016-02-02 DIAGNOSIS — M25559 Pain in unspecified hip: Secondary | ICD-10-CM | POA: Diagnosis not present

## 2016-02-02 DIAGNOSIS — J90 Pleural effusion, not elsewhere classified: Secondary | ICD-10-CM | POA: Diagnosis not present

## 2016-02-02 DIAGNOSIS — W19XXXA Unspecified fall, initial encounter: Secondary | ICD-10-CM | POA: Diagnosis present

## 2016-02-02 DIAGNOSIS — D500 Iron deficiency anemia secondary to blood loss (chronic): Secondary | ICD-10-CM

## 2016-02-02 DIAGNOSIS — E876 Hypokalemia: Secondary | ICD-10-CM | POA: Diagnosis not present

## 2016-02-02 DIAGNOSIS — L8994 Pressure ulcer of unspecified site, stage 4: Secondary | ICD-10-CM | POA: Insufficient documentation

## 2016-02-02 DIAGNOSIS — K5900 Constipation, unspecified: Secondary | ICD-10-CM | POA: Diagnosis not present

## 2016-02-02 DIAGNOSIS — M542 Cervicalgia: Secondary | ICD-10-CM | POA: Diagnosis not present

## 2016-02-02 DIAGNOSIS — Z7952 Long term (current) use of systemic steroids: Secondary | ICD-10-CM

## 2016-02-02 DIAGNOSIS — W06XXXA Fall from bed, initial encounter: Secondary | ICD-10-CM | POA: Diagnosis present

## 2016-02-02 DIAGNOSIS — R51 Headache: Secondary | ICD-10-CM | POA: Diagnosis not present

## 2016-02-02 DIAGNOSIS — L89152 Pressure ulcer of sacral region, stage 2: Secondary | ICD-10-CM | POA: Diagnosis not present

## 2016-02-02 DIAGNOSIS — M48061 Spinal stenosis, lumbar region without neurogenic claudication: Secondary | ICD-10-CM | POA: Diagnosis present

## 2016-02-02 DIAGNOSIS — Y92009 Unspecified place in unspecified non-institutional (private) residence as the place of occurrence of the external cause: Secondary | ICD-10-CM

## 2016-02-02 DIAGNOSIS — N179 Acute kidney failure, unspecified: Secondary | ICD-10-CM

## 2016-02-02 DIAGNOSIS — E119 Type 2 diabetes mellitus without complications: Secondary | ICD-10-CM | POA: Diagnosis present

## 2016-02-02 DIAGNOSIS — R0602 Shortness of breath: Secondary | ICD-10-CM

## 2016-02-02 DIAGNOSIS — S0990XA Unspecified injury of head, initial encounter: Secondary | ICD-10-CM | POA: Diagnosis not present

## 2016-02-02 DIAGNOSIS — S3993XA Unspecified injury of pelvis, initial encounter: Secondary | ICD-10-CM | POA: Diagnosis not present

## 2016-02-02 DIAGNOSIS — I252 Old myocardial infarction: Secondary | ICD-10-CM

## 2016-02-02 LAB — COMPREHENSIVE METABOLIC PANEL
ALBUMIN: 3.2 g/dL — AB (ref 3.5–5.0)
ALK PHOS: 44 U/L (ref 38–126)
ALT: 29 U/L (ref 14–54)
ANION GAP: 5 (ref 5–15)
AST: 15 U/L (ref 15–41)
BUN: 28 mg/dL — ABNORMAL HIGH (ref 6–20)
CALCIUM: 8 mg/dL — AB (ref 8.9–10.3)
CO2: 25 mmol/L (ref 22–32)
Chloride: 108 mmol/L (ref 101–111)
Creatinine, Ser: 0.87 mg/dL (ref 0.44–1.00)
GFR calc non Af Amer: >60 mL/min (ref >60)
GLUCOSE: 121 mg/dL — AB (ref 65–99)
POTASSIUM: 4.4 mmol/L (ref 3.5–5.1)
SODIUM: 138 mmol/L (ref 135–145)
TOTAL PROTEIN: 5.5 g/dL — AB (ref 6.5–8.1)
Total Bilirubin: 1 mg/dL (ref 0.3–1.2)

## 2016-02-02 LAB — CBC WITH DIFFERENTIAL/PLATELET
BASOS PCT: 0 %
Basophils Absolute: 0 10*3/uL (ref 0.0–0.1)
EOS ABS: 0 10*3/uL (ref 0.0–0.7)
EOS PCT: 0 %
HCT: 29.2 % — ABNORMAL LOW (ref 36.0–46.0)
HEMOGLOBIN: 9.8 g/dL — AB (ref 12.0–15.0)
LYMPHS ABS: 0.5 10*3/uL — AB (ref 0.7–4.0)
Lymphocytes Relative: 10 %
MCH: 33.7 pg (ref 26.0–34.0)
MCHC: 33.6 g/dL (ref 30.0–36.0)
MCV: 100.3 fL — ABNORMAL HIGH (ref 78.0–100.0)
MONO ABS: 0.2 10*3/uL (ref 0.1–1.0)
MONOS PCT: 4 %
NEUTROS PCT: 86 %
Neutro Abs: 4.6 10*3/uL (ref 1.7–7.7)
Platelets: 68 10*3/uL — ABNORMAL LOW (ref 150–400)
RBC: 2.91 MIL/uL — ABNORMAL LOW (ref 3.87–5.11)
RDW: 18 % — AB (ref 11.5–15.5)
WBC: 5.3 10*3/uL (ref 4.0–10.5)

## 2016-02-02 LAB — URINALYSIS, ROUTINE W REFLEX MICROSCOPIC
Bilirubin Urine: NEGATIVE
Glucose, UA: NEGATIVE mg/dL
Hgb urine dipstick: NEGATIVE
KETONES UR: NEGATIVE mg/dL
LEUKOCYTES UA: NEGATIVE
NITRITE: NEGATIVE
PH: 6 (ref 5.0–8.0)
PROTEIN: NEGATIVE mg/dL
Specific Gravity, Urine: 1.024 (ref 1.005–1.030)

## 2016-02-02 LAB — I-STAT CHEM 8, ED
BUN: 28 mg/dL — ABNORMAL HIGH (ref 6–20)
CHLORIDE: 107 mmol/L (ref 101–111)
Calcium, Ion: 1.07 mmol/L — ABNORMAL LOW (ref 1.15–1.40)
Creatinine, Ser: 1 mg/dL (ref 0.44–1.00)
Glucose, Bld: 117 mg/dL — ABNORMAL HIGH (ref 65–99)
HEMATOCRIT: 27 % — AB (ref 36.0–46.0)
HEMOGLOBIN: 9.2 g/dL — AB (ref 12.0–15.0)
POTASSIUM: 4.4 mmol/L (ref 3.5–5.1)
Sodium: 140 mmol/L (ref 135–145)
TCO2: 26 mmol/L (ref 0–100)

## 2016-02-02 MED ORDER — PREGABALIN 100 MG PO CAPS
200.0000 mg | ORAL_CAPSULE | Freq: Two times a day (BID) | ORAL | Status: DC
  Administered 2016-02-02 – 2016-02-09 (×15): 200 mg via ORAL
  Filled 2016-02-02 (×15): qty 2

## 2016-02-02 MED ORDER — FENTANYL 100 MCG/HR TD PT72
100.0000 ug | MEDICATED_PATCH | TRANSDERMAL | Status: DC
  Administered 2016-02-02: 100 ug via TRANSDERMAL
  Filled 2016-02-02: qty 1

## 2016-02-02 MED ORDER — LIDOCAINE-PRILOCAINE 2.5-2.5 % EX CREA: TOPICAL_CREAM | Freq: Once | CUTANEOUS | Status: DC

## 2016-02-02 MED ORDER — TIZANIDINE HCL 4 MG PO TABS
4.0000 mg | ORAL_TABLET | Freq: Two times a day (BID) | ORAL | Status: DC
  Administered 2016-02-02 – 2016-02-07 (×10): 4 mg via ORAL
  Filled 2016-02-02 (×10): qty 1

## 2016-02-02 MED ORDER — NYSTATIN 100000 UNIT/GM EX POWD
Freq: Two times a day (BID) | CUTANEOUS | Status: DC
  Administered 2016-02-02: 22:00:00 via TOPICAL
  Filled 2016-02-02: qty 15

## 2016-02-02 MED ORDER — ONDANSETRON HCL 4 MG PO TABS: 4.0000 mg | ORAL_TABLET | Freq: Three times a day (TID) | ORAL | Status: DC | PRN

## 2016-02-02 MED ORDER — FENTANYL CITRATE (PF) 100 MCG/2ML IJ SOLN
100.0000 ug | Freq: Once | INTRAMUSCULAR | Status: AC
  Administered 2016-02-02: 100 ug via INTRAVENOUS
  Filled 2016-02-02: qty 2

## 2016-02-02 MED ORDER — METHOCARBAMOL 500 MG PO TABS
750.0000 mg | ORAL_TABLET | Freq: Three times a day (TID) | ORAL | Status: DC | PRN
Start: 2016-02-02 — End: 2016-02-09
  Administered 2016-02-05: 750 mg via ORAL
  Filled 2016-02-02: qty 2

## 2016-02-02 MED ORDER — ENOXAPARIN SODIUM 40 MG/0.4ML ~~LOC~~ SOLN: 40.0000 mg | SUBCUTANEOUS | Status: DC

## 2016-02-02 MED ORDER — IBUPROFEN 200 MG PO TABS: 600.0000 mg | ORAL_TABLET | Freq: Four times a day (QID) | ORAL | Status: DC | PRN

## 2016-02-02 MED ORDER — AMPICILLIN 500 MG PO CAPS
500.0000 mg | ORAL_CAPSULE | Freq: Four times a day (QID) | ORAL | Status: DC
  Administered 2016-02-02 – 2016-02-03 (×2): 500 mg via ORAL
  Filled 2016-02-02 (×5): qty 1

## 2016-02-02 MED ORDER — IOPAMIDOL (ISOVUE-300) INJECTION 61%
100.0000 mL | Freq: Once | INTRAVENOUS | Status: AC | PRN
  Administered 2016-02-02: 100 mL via INTRAVENOUS

## 2016-02-02 MED ORDER — ADULT MULTIVITAMIN W/MINERALS CH
1.0000 | ORAL_TABLET | Freq: Every day | ORAL | Status: DC
  Administered 2016-02-03 – 2016-02-09 (×7): 1 via ORAL
  Filled 2016-02-02 (×8): qty 1

## 2016-02-02 MED ORDER — LEVOTHYROXINE SODIUM 50 MCG PO TABS
150.0000 ug | ORAL_TABLET | Freq: Every day | ORAL | Status: DC
  Administered 2016-02-03 – 2016-02-09 (×7): 150 ug via ORAL
  Filled 2016-02-02 (×8): qty 1

## 2016-02-02 MED ORDER — SUVOREXANT 20 MG PO TABS: 20.0000 mg | ORAL_TABLET | Freq: Every evening | ORAL | Status: DC | PRN

## 2016-02-02 MED ORDER — FENTANYL 25 MCG/HR TD PT72
25.0000 ug | MEDICATED_PATCH | TRANSDERMAL | Status: DC
  Administered 2016-02-02: 25 ug via TRANSDERMAL
  Filled 2016-02-02: qty 1

## 2016-02-02 MED ORDER — PANTOPRAZOLE SODIUM 40 MG PO TBEC
40.0000 mg | DELAYED_RELEASE_TABLET | Freq: Two times a day (BID) | ORAL | Status: DC
  Administered 2016-02-02 – 2016-02-09 (×14): 40 mg via ORAL
  Filled 2016-02-02 (×15): qty 1

## 2016-02-02 MED ORDER — TEMAZEPAM 15 MG PO CAPS
30.0000 mg | ORAL_CAPSULE | Freq: Every day | ORAL | Status: DC
  Administered 2016-02-02 – 2016-02-09 (×7): 30 mg via ORAL
  Filled 2016-02-02 (×7): qty 2

## 2016-02-02 MED ORDER — NITROGLYCERIN 0.4 MG SL SUBL: 0.4000 mg | SUBLINGUAL_TABLET | SUBLINGUAL | Status: DC | PRN

## 2016-02-02 MED ORDER — SODIUM CHLORIDE 0.9 % IV BOLUS (SEPSIS)
1000.0000 mL | Freq: Once | INTRAVENOUS | Status: AC
  Administered 2016-02-02: 1000 mL via INTRAVENOUS

## 2016-02-02 MED ORDER — LORAZEPAM 0.5 MG PO TABS
0.5000 mg | ORAL_TABLET | Freq: Four times a day (QID) | ORAL | Status: DC | PRN
  Administered 2016-02-02 – 2016-02-08 (×2): 0.5 mg via ORAL
  Filled 2016-02-02: qty 1

## 2016-02-02 MED ORDER — FLUOXETINE HCL 20 MG PO CAPS
40.0000 mg | ORAL_CAPSULE | Freq: Every day | ORAL | Status: DC
  Administered 2016-02-03 – 2016-02-09 (×7): 40 mg via ORAL
  Filled 2016-02-02 (×8): qty 2

## 2016-02-02 MED ORDER — OXYCODONE HCL 5 MG PO TABS
15.0000 mg | ORAL_TABLET | ORAL | Status: DC | PRN
  Administered 2016-02-02 – 2016-02-09 (×9): 15 mg via ORAL
  Filled 2016-02-02 (×9): qty 3

## 2016-02-02 MED ORDER — FENTANYL CITRATE (PF) 100 MCG/2ML IJ SOLN
50.0000 ug | Freq: Once | INTRAMUSCULAR | Status: AC
  Administered 2016-02-02: 50 ug via INTRAVENOUS
  Filled 2016-02-02: qty 2

## 2016-02-02 MED ORDER — CARVEDILOL 3.125 MG PO TABS: 3.1250 mg | ORAL_TABLET | Freq: Two times a day (BID) | ORAL | Status: DC

## 2016-02-02 NOTE — ED Notes (Signed)
Bed: WA13 Expected date:  Expected time:  Means of arrival:  Comments: EMS-fall 

## 2016-02-02 NOTE — ED Triage Notes (Signed)
Per EMS-states she fell yesterday-on chemo for breast cancer-has been increasing weak-left flank and pelvic bruising-pain on right side-decub on left-no LOC no head, neck and back pain

## 2016-02-02 NOTE — H&P (Signed)
History and Physical  Kerri Vasquez [id number] DOB: 13-Feb-1956 DOA: 02/02/2016  Referring physician: EDP PCP: [name], MD   Chief Complaint: progressive weakness, fall  HPI: Kerri Vasquez is a 60 y.o. female   With metastatic breast cancer ,currently off therapy, reported progressive weakness, has been in bed for the last three weeks, she fell out of bed yesterday and hit her abdomen and ribs, she presented to the ED today due to increased pain. She denies fever, does has chronic back pain, denies bowel or bladder incontinence, does report increased weakness and numbness of lower extremity, she developed a bedsore on her sacrum for which she is taking amoxicillin for. She denies chest pain, no sob, no cough, no abdominal pain.  ED course, her vital signs are stable, CT head cervical spine, chest and abdomen does show minimal displaced right 8th rib fracture without pneumothorax, there are also signs of cancer progression, hospitalist called to admit the patient due to fall, FTT and pain control.  Review of Systems:  Detail per HPI, Review of systems are otherwise negative  Past Medical History:  Diagnosis Date  . Anemia   . Breast cancer, right breast ([place]) dx'd 2008   right  . CHF (congestive heart failure) ([place])   . Chronic lower back pain   . Depression   . Diabetes mellitus    type II, controlled by diet 05/25/11   . Fibromyalgia   . Fracture of multiple toes    right toes x 4 toes- excluding small toe  . GERD (gastroesophageal reflux disease)   . Hypertension   . Hypothyroidism   . Incisional hernia   . Intestinal obstruction   . Iron deficiency anemia due to chronic blood loss 07/29/2015  . 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 Migraine    "weekly; but they don't last long" (08/26/2014)  . Myocardial infarction 06/2014   S/P percutaneous intervention procedure a metastatic lesion in the liver  .  Nausea and vomiting 04/2015  . Osteoarthritis of multiple joints   . Pernicious anemia 05/30/2011   Iron transfusion and VB12 on 06/06/11  . Renal insufficiency    hx renal failure 2011  . Sciatica   . Scoliosis   . Septic arthritis ([place]) 01/28/10   and spinal stenosis , moderate scoliosis   . Sleep apnea    hx of off machine x 4 years   . Spinal stenosis   . Thyroid cancer ([place]) 2013   Past Surgical History:  Procedure Laterality Date  . BREAST BIOPSY Right 2008  . BREAST IMPLANT REMOVAL Left 2015  . BREAST RECONSTRUCTION Bilateral 2008; 2009   "w/the mastectomy; "  . BREAST RECONSTRUCTION Left ~ 2010   "it capsized"  . CARDIAC CATHETERIZATION    . CHOLECYSTECTOMY OPEN  06/29/85  . COLECTOMY  2011   "SBO"  . GASTRIC BYPASS  2006   "w/scope"  . LEFT HEART CATHETERIZATION WITH CORONARY ANGIOGRAM N/A 07/14/2014   Procedure: LEFT HEART CATHETERIZATION WITH CORONARY ANGIOGRAM;  Surgeon: Leonie Man, MD;  Location[facility] CATH LAB;  Service: Cardiovascular;  Laterality: N/A;  . LIVER BIOPSY  04/2015  . MASTECTOMY Bilateral 2008  . MULTIPLE EXTRACTIONS WITH ALVEOLOPLASTY  06/08/2011   Procedure: MULTIPLE EXTRACION WITH ALVEOLOPLASTY;  Surgeon: Lenn Cal, DDS;  Location: [facility] ORS;  Service: Oral Surgery;  Laterality: N/A;  Extraction of tooth #'s 3,4,7 with alveoloplasty and Gross Debridement of Remaining Teeth  .  [facility] LIVER TUMOR  2011; 01/26/2012  . THYROID LOBECTOMY  02/16/2012   Procedure: THYROID LOBECTOMY;  Surgeon: Earnstine Regal, MD;  Location: [facility] ORS;  Service: General;  Laterality: Right;  . TONSILLECTOMY  1969   Social History:  reports that she has never smoked. She has never used smokeless tobacco. She reports that she does not drink alcohol or use drugs. Patient lives at home & has been largely bedbound for the last three weeks  Allergies  Allergen Reactions  . Influenza Vac Split [Flu Virus Vaccine] Other (See Comments)    Joint stiffness and renal  failure  . Pneumococcal Vaccines Other (See Comments)    "sepsis and renal failure"  . Ritalin [Methylphenidate Hcl]     "Heart attack"  . Zostavax [Zoster Vaccine Live ([employer])] Other (See Comments)    Joint stiffness, renal failure  . Adhesive [Tape] Itching and Rash    Family History  Problem Relation Age of Onset  . Cancer Mother     breast  . Hypertension Mother   . Anesthesia problems Daughter       Prior to Admission medications   Medication Sig Start Date End Date Taking? Authorizing Provider  ampicillin (PRINCIPEN) 500 MG capsule TAKE ONE CAPSULE BY MOUTH FOUR TIMES DAILY 02/01/16   Volanda Napoleon, MD  capecitabine (XELODA) 500 MG tablet Take 4 capsules TWICE a day with meals for 14 days. 12/23/15   Volanda Napoleon, MD  carvedilol (COREG) 6.25 MG tablet TAKE 1 TABLET BY MOUTH TWICE DAILY 01/19/16   Volanda Napoleon, MD  cyanocobalamin (,VITAMIN B-12,) 1000 MCG/ML injection INJECT 1 ML AS DIRECTED EVERY 21 DAYS 03/18/15   [place], NP  dexamethasone (DECADRON) 4 MG tablet TAKE 2 TABLETS BY MOUTH TWICE DAILY WITH A MEAL STARTING THE DAY AFTER CHEMOTHERAPY. 01/26/16   Volanda Napoleon, MD  diphenoxylate-atropine (LOMOTIL) 2.5-0.025 MG tablet TAKE 1 TABLET BY MOUTH FOUR TIMES DAILY AS NEEDED FOR DIARRHEA 01/18/16   Volanda Napoleon, MD  fentaNYL (DURAGESIC - DOSED MCG/HR) 25 MCG/HR patch Place 1 patch (25 mcg total) onto the skin every other day. 01/20/16   Volanda Napoleon, MD  fentaNYL (DURAGESIC - DOSED MCG/HR) 50 MCG/HR Place 2 patches (100 mcg total) onto the skin every other day. 01/20/16   Volanda Napoleon, MD  FLUoxetine (PROZAC) 40 MG capsule Take 40 mg by mouth daily before breakfast.     Historical Provider, MD  furosemide (LASIX) 20 MG tablet Take 60 mg by mouth daily.     Historical Provider, MD  levothyroxine (SYNTHROID, LEVOTHROID) 150 MCG tablet Take 1 tablet (150 mcg total) by mouth daily. 01/20/16   Volanda Napoleon, MD  LORazepam (ATIVAN) 0.5 MG tablet PLACE 1  TABLET UNDER TONGUE EVERY 6 HOURS AS NEEDED FOR NAUSEA AND VOMITING 01/20/16   Volanda Napoleon, MD  LYRICA 200 MG capsule TAKE 1 CAPSULE BY MOUTH TWICE DAILY. 01/12/16   Volanda Napoleon, MD  methocarbamol (ROBAXIN) 750 MG tablet TAKE 1 TABLET BY MOUTH THREE TIMES DAILY AS NEEDED 01/27/16   Volanda Napoleon, MD  Multiple Vitamin (MULTI-VITAMINS) TABS Take 1 tablet by mouth daily. 11/29/11   Historical Provider, MD  nitroGLYCERIN (NITROSTAT) 0.4 MG SL tablet Place 1 tablet (0.4 mg total) under the tongue every 5 (five) minutes as needed for chest pain. 07/18/14   Annita Brod, MD  ondansetron (ZOFRAN) 8 MG tablet TAKE 1 TABLET BY MOUTH EVERY 8 HOURS AS NEEDED FOR  NAUSEA AND VOMITING 01/20/16   Volanda Napoleon, MD  oxyCODONE (ROXICODONE) 15 MG immediate release tablet Take 1 tablet (15 mg total) by mouth every 4 (four) hours as needed for pain. 01/20/16   Volanda Napoleon, MD  pantoprazole (PROTONIX) 40 MG tablet Take 1 tablet (40 mg total) by mouth 2 (two) times daily. 09/16/15   Volanda Napoleon, MD  polyethylene glycol [facility] Floria Raveling) packet Take 17 g by mouth 2 (two) times daily. 09/16/15   Volanda Napoleon, MD  potassium chloride SA (K-DUR,KLOR-CON) 20 MEQ tablet TAKE 2 TABLETS BY MOUTH TWICE DAILY 10/19/15   Jolaine Artist, MD  prochlorperazine (COMPAZINE) 10 MG tablet TAKE 1 TABLET BY MOUTH EVERY 6 HOURS AS NEEDED FOR NAUSEA AND VOMITING. 01/06/16   Volanda Napoleon, MD  promethazine (PHENERGAN) 25 MG tablet TAKE 1 TABLET BY MOUTH THREE TIMES DAILY AS NEEDED FOR NAUSEA. 10/27/15   Eliezer Bottom, NP  promethazine (PHENERGAN) 25 MG tablet TAKE 1 TABLET BY MOUTH EVERY 8 HOURS AS NEEDED FOR NAUSEA OR VOMITING. 01/12/16   Volanda Napoleon, MD  promethazine (PHENERGAN) 25 MG tablet TAKE 1 TABLET BY MOUTH EVERY 8 HOURS AS NEEDED FOR NAUSEA OR VOMITING. 01/13/16   Volanda Napoleon, MD  Suvorexant (BELSOMRA) 20 MG TABS Take 20 mg by mouth at bedtime as needed. 12/23/15   Volanda Napoleon, MD  temazepam  (RESTORIL) 15 MG capsule Take 30 mg by mouth at bedtime.    Historical Provider, MD  tiZANidine (ZANAFLEX) 4 MG capsule TAKE 1 CAPSULE BY MOUTH EVERY 8 HOURS. 01/12/16   Volanda Napoleon, MD  tiZANidine (ZANAFLEX) 4 MG capsule TAKE 1 CAPSULE BY MOUTH EVERY 8 HOURS. 01/13/16   Volanda Napoleon, MD    Physical Exam: BP 112/71   Pulse (!) 57   Temp 98 F (36.7 C) (Oral)   Resp 16   LMP 08/30/2006   SpO2 98%   General:  Chronically ill, obese Eyes: PERRL ENT: unremarkable Neck: supple, no JVD Cardiovascular: RRR Respiratory: CTABL Abdomen: soft/ND/ND, positive bowel sounds Skin: no rash Musculoskeletal:  No edema Psychiatric: calm/cooperative Neurologic: bilateral lower extremity not able to lift against gravity          Labs on Admission:  Basic Metabolic Panel:  Recent Labs Lab 02/02/16 1321 02/02/16 1341  NA 138 140  K 4.4 4.4  CL 108 107  CO2 25  --   GLUCOSE 121* 117*  BUN 28* 28*  CREATININE 0.87 1.00  CALCIUM 8.0*  --    Liver Function Tests:  Recent Labs Lab 02/02/16 1321  AST 15  ALT 29  ALKPHOS 44  BILITOT 1.0  PROT 5.5*  ALBUMIN 3.2*   No results for input(s): LIPASE, AMYLASE in the last 168 hours. No results for input(s): AMMONIA in the last 168 hours. CBC:  Recent Labs Lab 02/02/16 1321 02/02/16 1341  WBC 5.3  --   NEUTROABS 4.6  --   HGB 9.8* 9.2*  HCT 29.2* 27.0*  MCV 100.3*  --   PLT 68*  --    Cardiac Enzymes: No results for input(s): CKTOTAL, CKMB, CKMBINDEX, TROPONINI in the last 168 hours.  BNP (last 3 results) No results for input(s): BNP in the last 8760 hours.  ProBNP (last 3 results) No results for input(s): PROBNP in the last 8760 hours.  CBG: No results for input(s): GLUCAP in the last 168 hours.  Radiological Exams on Admission: Dg Chest 1 View  Result Date:  02/02/2016 CLINICAL DATA:  Increasing weakness, with recent fall. Receiving chemotherapy for breast cancer. EXAM: CHEST 1 VIEW COMPARISON:  04/19/2015.  FINDINGS: Cardiomegaly. Port-A-Cath tip RIGHT atrium. No active infiltrates or failure. LEFT basilar subsegmental atelectasis. No visible pulmonary nodules. No visible rib fracture. Similar appearance to priors. IMPRESSION: Unremarkable supine AP chest. Electronically Signed   By: Staci Righter M.D.   On: 02/02/2016 15:13   Dg Pelvis 1-2 Views  Result Date: 02/02/2016 CLINICAL DATA:  Pain following fall.  History of breast carcinoma EXAM: PELVIS - 1-2 VIEW COMPARISON:  None. FINDINGS: There is no evidence of pelvic fracture or dislocation. Joint spaces appear normal. There is dextroscoliosis in the lower lumbar spine. There are no blastic or lytic bone lesions. IMPRESSION: No acute fracture or dislocation. No evident blastic or lytic bone lesions. Hip joints appear symmetric bilaterally. Scoliosis in the lower lumbar spine region. Electronically Signed   By: Lowella Grip III M.D.   On: 02/02/2016 15:12   Ct Head Wo Contrast  Result Date: 02/02/2016 CLINICAL DATA:  60 y/o F; status post fall yesterday with head injury to the right parietal area with pain in the right occipital and right-sided head. Posterior neck pain. EXAM: CT HEAD WITHOUT CONTRAST CT CERVICAL SPINE WITHOUT CONTRAST TECHNIQUE: Multidetector CT imaging of the head and cervical spine was performed following the standard protocol without intravenous contrast. Multiplanar CT image reconstructions of the cervical spine were also generated. COMPARISON:  04/19/2015 MRI of the brain. FINDINGS: CT HEAD FINDINGS Brain: No evidence of acute infarction, hemorrhage, hydrocephalus, extra-axial collection or mass lesion/mass effect. Mild chronic microvascular ischemic changes. Vascular: No hyperdense vessel. Mild calcific atherosclerosis of carotid siphons. Skull: Choose Sinuses/Orbits: Mild maxillary sinus mucosal thickening. Otherwise visualized paranasal sinuses and mastoid air cells are normally aerated. Orbits are unremarkable. Other: None. CT  CERVICAL SPINE FINDINGS Alignment: Straightening of cervical lordosis with reversal at C5. Degenerative grade 1 C4-5 anterolisthesis. Skull base and vertebrae: No acute fracture. No primary bone lesion or focal pathologic process. Soft tissues and spinal canal: No prevertebral fluid or swelling. No visible canal hematoma. Disc levels: Multilevel cervical spondylosis greatest at the C5 through C7 levels where there is extensive discogenic disease and small marginal osteophytes. There is mild to moderate bony foraminal narrowing at the C6-7 level. No high-grade bony canal stenosis. Upper chest: Negative. Other: Calcific atherosclerosis of bilateral carotid bifurcations. Right IJ central line partially visualized. Patent airway. IMPRESSION: 1. No acute intracranial abnormality is identified. 2. Mild chronic microvascular ischemic changes of the brain are stable. 3. No acute fracture or dislocation of the cervical spine. 4. Cervical spine degenerative changes greatest at C5 through C7 Electronically Signed   By: Kristine Garbe M.D.   On: 02/02/2016 15:07   Ct Chest W Contrast  Result Date: 02/02/2016 CLINICAL DATA:  60 year old female with a history of metastatic breast cancer presents status post fall at home yesterday with head injury. EXAM: CT CHEST, ABDOMEN, AND PELVIS WITH CONTRAST TECHNIQUE: Multidetector CT imaging of the chest, abdomen and pelvis was performed following the standard protocol during bolus administration of intravenous contrast. CONTRAST:  113mL ISOVUE-300 IOPAMIDOL (ISOVUE-300) INJECTION 61% COMPARISON:  10/26/2015 PET-CT. 04/20/2015 chest CT. 04/19/2015 CT abdomen/ pelvis. FINDINGS: CT CHEST FINDINGS Motion degraded scan. Cardiovascular: Stable mild cardiomegaly. No significant pericardial fluid/thickening. Right internal jugular MediPort terminates at the cavoatrial junction. Atherosclerotic nonaneurysmal thoracic aorta. Normal caliber pulmonary arteries. No evidence of acute  thoracic aortic injury. No central pulmonary emboli. Mediastinum/Nodes: No pneumomediastinum. No mediastinal hematoma.  Simple fluid lateral to the ascending thoracic aorta, unchanged back to 04/20/2015, favored represent a pericardial cyst or fluid in a pericardial recess. Right hemithyroidectomy. No discrete left thyroid lobe nodules. Unremarkable esophagus. No axillary, mediastinal or hilar lymphadenopathy. Lungs/Pleura: No pneumothorax. Trace bilateral layering pleural effusions. Mild compressive atelectasis in the dependent lower lobes. No acute consolidative airspace disease, lung masses or significant pulmonary nodules. Musculoskeletal: No aggressive appearing focal osseous lesions. Acute minimally displaced lateral right eighth rib fracture. No additional fracture in the chest. Moderate thoracic spondylosis. Partially visualized intact appearing right breast prosthesis. CT ABDOMEN PELVIS FINDINGS Hepatobiliary: There are at least 10 scattered hypodense lesions throughout the liver, which appear increased in size and number since 10/26/2015. A 6.4 x 3.9 cm liver mass in the medial segment left liver lobe (series 9/ image 42) is increased from 5.3 x 2.7 cm. A 4.1 x 2.2 cm liver dome mass (series 9/ image 32), is increased from 2.3 x 1.7 cm. No liver laceration. Cholecystectomy .Bile ducts are stable and within expected post cholecystectomy limits with common bile duct diameter 9 mm. No radiopaque choledocholithiasis. Pancreas: No pancreatic mass, duct dilation, laceration or peripancreatic fluid collections. Spleen: Normal size. No laceration or mass. Adrenals/Urinary Tract: Normal adrenals. Nonobstructing 5 mm lower left renal stone. No hydronephrosis. No renal laceration. No renal mass. Normal bladder. 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 Normal caliber small bowel with no small  bowel wall thickening. Stable appearance of the appendix with no appendiceal wall thickening or significant periappendiceal fat stranding. Normal large bowel with no diverticulosis, large bowel wall thickening or pericolonic fat stranding. Vascular/Lymphatic: Normal caliber abdominal aorta. Patent portal, splenic and renal veins. No pathologically enlarged lymph nodes in the abdomen or pelvis. Reproductive:  Grossly normal uterus.  No adnexal mass. Other: No pneumoperitoneum. Trace simple free fluid in the lower peritoneal cavity anteriorly. Nonspecific caking in the right omentum appears new. Small midline supraumbilical ventral abdominal wall hernia contains a small portion of the sigmoid colon, decreased in size. No associated colonic wall thickening or pneumatosis. Musculoskeletal: No aggressive appearing focal osseous lesions. No fracture in the abdomen or pelvis. Severe lumbar spondylosis, not appreciably changed back to 04/19/2015. IMPRESSION: 1. Minimally displaced acute lateral right eighth rib fracture. No pneumothorax . 2. Otherwise no acute traumatic injuries in the chest, abdomen or pelvis. 3. Liver metastases have increased in size and number since 10/26/2015 PET-CT study . 4. New nonspecific caking in the right omentum, worrisome for peritoneal carcinomatosis. New simple fluid density small volume ascites in the anterior lower abdomen. 5. Additional findings include stable mild cardiomegaly, aortic atherosclerosis, trace layering bilateral pleural effusions and nonobstructing left renal stone. Electronically Signed   By: Ilona Sorrel M.D.   On: 02/02/2016 15:29   Ct Cervical Spine Wo Contrast  Result Date: 02/02/2016 CLINICAL DATA:  60 y/o F; status post fall yesterday with head injury to the right parietal area with pain in the right occipital and right-sided head. Posterior neck pain. EXAM: CT HEAD WITHOUT CONTRAST CT CERVICAL SPINE WITHOUT CONTRAST TECHNIQUE: Multidetector CT imaging of the  head and cervical spine was performed following the standard protocol without intravenous contrast. Multiplanar CT image reconstructions of the cervical spine were also generated. COMPARISON:  04/19/2015 MRI of the brain. FINDINGS: CT HEAD FINDINGS Brain: No evidence of acute infarction, hemorrhage, hydrocephalus, extra-axial collection or mass lesion/mass effect. Mild chronic microvascular ischemic changes. Vascular: No hyperdense vessel. Mild  calcific atherosclerosis of carotid siphons. Skull: Choose Sinuses/Orbits: Mild maxillary sinus mucosal thickening. Otherwise visualized paranasal sinuses and mastoid air cells are normally aerated. Orbits are unremarkable. Other: None. CT CERVICAL SPINE FINDINGS Alignment: Straightening of cervical lordosis with reversal at C5. Degenerative grade 1 C4-5 anterolisthesis. Skull base and vertebrae: No acute fracture. No primary bone lesion or focal pathologic process. Soft tissues and spinal canal: No prevertebral fluid or swelling. No visible canal hematoma. Disc levels: Multilevel cervical spondylosis greatest at the C5 through C7 levels where there is extensive discogenic disease and small marginal osteophytes. There is mild to moderate bony foraminal narrowing at the C6-7 level. No high-grade bony canal stenosis. Upper chest: Negative. Other: Calcific atherosclerosis of bilateral carotid bifurcations. Right IJ central line partially visualized. Patent airway. IMPRESSION: 1. No acute intracranial abnormality is identified. 2. Mild chronic microvascular ischemic changes of the brain are stable. 3. No acute fracture or dislocation of the cervical spine. 4. Cervical spine degenerative changes greatest at C5 through C7 Electronically Signed   By: Kristine Garbe M.D.   On: 02/02/2016 15:07   Ct Abdomen Pelvis W Contrast  Result Date: 02/02/2016 CLINICAL DATA:  60 year old female with a history of metastatic breast cancer presents status post fall at home yesterday  with head injury. EXAM: CT CHEST, ABDOMEN, AND PELVIS WITH CONTRAST TECHNIQUE: Multidetector CT imaging of the chest, abdomen and pelvis was performed following the standard protocol during bolus administration of intravenous contrast. CONTRAST:  157mL ISOVUE-300 IOPAMIDOL (ISOVUE-300) INJECTION 61% COMPARISON:  10/26/2015 PET-CT. 04/20/2015 chest CT. 04/19/2015 CT abdomen/ pelvis. FINDINGS: CT CHEST FINDINGS Motion degraded scan. Cardiovascular: Stable mild cardiomegaly. No significant pericardial fluid/thickening. Right internal jugular MediPort terminates at the cavoatrial junction. Atherosclerotic nonaneurysmal thoracic aorta. Normal caliber pulmonary arteries. No evidence of acute thoracic aortic injury. No central pulmonary emboli. Mediastinum/Nodes: No pneumomediastinum. No mediastinal hematoma. Simple fluid lateral to the ascending thoracic aorta, unchanged back to 04/20/2015, favored represent a pericardial cyst or fluid in a pericardial recess. Right hemithyroidectomy. No discrete left thyroid lobe nodules. Unremarkable esophagus. No axillary, mediastinal or hilar lymphadenopathy. Lungs/Pleura: No pneumothorax. Trace bilateral layering pleural effusions. Mild compressive atelectasis in the dependent lower lobes. No acute consolidative airspace disease, lung masses or significant pulmonary nodules. Musculoskeletal: No aggressive appearing focal osseous lesions. Acute minimally displaced lateral right eighth rib fracture. No additional fracture in the chest. Moderate thoracic spondylosis. Partially visualized intact appearing right breast prosthesis. CT ABDOMEN PELVIS FINDINGS Hepatobiliary: There are at least 10 scattered hypodense lesions throughout the liver, which appear increased in size and number since 10/26/2015. A 6.4 x 3.9 cm liver mass in the medial segment left liver lobe (series 9/ image 42) is increased from 5.3 x 2.7 cm. A 4.1 x 2.2 cm liver dome mass (series 9/ image 32), is increased from  2.3 x 1.7 cm. No liver laceration. Cholecystectomy .Bile ducts are stable and within expected post cholecystectomy limits with common bile duct diameter 9 mm. No radiopaque choledocholithiasis. Pancreas: No pancreatic mass, duct dilation, laceration or peripancreatic fluid collections. Spleen: Normal size. No laceration or mass. Adrenals/Urinary Tract: Normal adrenals. Nonobstructing 5 mm lower left renal stone. No hydronephrosis. No renal laceration. No renal mass. Normal bladder. 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 Normal caliber small bowel with no small bowel wall thickening. Stable appearance of the appendix with no appendiceal wall thickening or significant periappendiceal fat stranding. Normal large bowel with no diverticulosis, large bowel  wall thickening or pericolonic fat stranding. Vascular/Lymphatic: Normal caliber abdominal aorta. Patent portal, splenic and renal veins. No pathologically enlarged lymph nodes in the abdomen or pelvis. Reproductive:  Grossly normal uterus.  No adnexal mass. Other: No pneumoperitoneum. Trace simple free fluid in the lower peritoneal cavity anteriorly. Nonspecific caking in the right omentum appears new. Small midline supraumbilical ventral abdominal wall hernia contains a small portion of the sigmoid colon, decreased in size. No associated colonic wall thickening or pneumatosis. Musculoskeletal: No aggressive appearing focal osseous lesions. No fracture in the abdomen or pelvis. Severe lumbar spondylosis, not appreciably changed back to 04/19/2015. IMPRESSION: 1. Minimally displaced acute lateral right eighth rib fracture. No pneumothorax . 2. Otherwise no acute traumatic injuries in the chest, abdomen or pelvis. 3. Liver metastases have increased in size and number since 10/26/2015 PET-CT study . 4. New nonspecific caking in the right  omentum, worrisome for peritoneal carcinomatosis. New simple fluid density small volume ascites in the anterior lower abdomen. 5. Additional findings include stable mild cardiomegaly, aortic atherosclerosis, trace layering bilateral pleural effusions and nonobstructing left renal stone. Electronically Signed   By: Ilona Sorrel M.D.   On: 02/02/2016 15:29     Assessment/Plan Present on Admission: . Rib fracture  Fall with progressive weakness Has been bed bound for the last three weeks, will get mri spine to r/o spine mets, fall precaution, PT/OT  Thrombocytopenia:  acute, unclear etiology. She report has been off chemo for three weeks, currently on sign of infection, no acute bleed Will get venous US lower extremity to r/o DVT,  H/o pernicious anemia: getting b12 shots, hgb seems to be stable at baseline.  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 Now with progressive weakness and ECOG score has declined,  Dr Martha Clan notified  Sacral decubitus ulcer : skin care, she report pus has resolved since she is started on amoxicillin, will continue for now  Bilateral groin candidiasis, topical nytstin  H/o diastolic chf,  will continue coreg with lower does and holding parameters, she state she stopped taking lasix recently, her edema has much improved compare to before    DVT prophylaxis: scd due to thrombocytopenia  Consultants: oncology  Code Status: full , confirmed with patient  Family Communication:  Patient   Disposition Plan: to med surg obs  Time spent: 65  Tareq Dwan MD, PhD [facility] Pager [phone] If 7PM-7AM, please contact night-coverage at [email or web address], password [facility]

## 2016-02-02 NOTE — ED Provider Notes (Signed)
[facility] DEPT Provider Note   CSN: [id number] Arrival date & time: 02/02/16  1232     History   Chief Complaint Chief Complaint  Patient presents with  . Fall  . Weakness    HPI Kerri Vasquez is a 60 y.o. female history CHF, diabetes, fibromyalgia, metastatic breast cancer not on chemotherapy here presenting with fall. Patient states that she has metastatic breast cancer was on chemotherapy but has failed it so chemotherapy was stopped 3 weeks ago. She states that she fell out of bed yesterday and fell and hit her abdomen as well as ribs. Also hit her head as well. She has been complaining of increasing bruising of the left side of her abdomen and right scalp and ribs. She has fentanyl patch on but still has pain.    The history is provided by the patient.    Past Medical History:  Diagnosis Date  . Anemia   . Breast cancer, right breast ([place]) dx'd 2008   right  . CHF (congestive heart failure) ([place])   . Chronic lower back pain   . Depression   . Diabetes mellitus    type II, controlled by diet 05/25/11   . Fibromyalgia   . Fracture of multiple toes    right toes x 4 toes- excluding small toe  . GERD (gastroesophageal reflux disease)   . Hypertension   . Hypothyroidism   . Incisional hernia   . Intestinal obstruction   . Iron deficiency anemia due to chronic blood loss 07/29/2015  . 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 Migraine    "weekly; but they don't last long" (08/26/2014)  . Myocardial infarction 06/2014   S/P percutaneous intervention procedure a metastatic lesion in the liver  . Nausea and vomiting 04/2015  . Osteoarthritis of multiple joints   . Pernicious anemia 05/30/2011   Iron transfusion and VB12 on 06/06/11  . Renal insufficiency    hx renal failure 2011  . Sciatica   . Scoliosis   . Septic arthritis ([place]) 01/28/10   and spinal stenosis , moderate scoliosis   . Sleep apnea    hx of off machine x 4  years   . Spinal stenosis   . Thyroid cancer [facility]) 2013    Patient Active Problem List   Diagnosis Date Noted  . Iron deficiency anemia due to chronic blood loss 07/29/2015  . 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 Elevated troponin 04/20/2015  . Chest discomfort 04/20/2015  . Vomiting 04/19/2015  . Intractable nausea and vomiting 04/19/2015  . Acute kidney injury superimposed on chronic kidney disease ([place]) 04/19/2015  . Primary malignant neoplasm of breast ([place]) 12/07/2014  . Takotsubo syndrome 09/04/2014  . Chronic diastolic CHF (congestive heart failure) ([place]) 09/04/2014  . Chronic diastolic heart failure ([place]) 09/04/2014  . Apical ballooning syndrome 09/04/2014  . Dehydration   . Peripheral edema   . AKI (acute kidney injury) ([place]) 08/26/2014  . Chronic systolic heart failure ([place]) 08/13/2014  . DNR (do not resuscitate) discussion 07/15/2014  . Weakness generalized 07/15/2014  . Palliative care encounter 07/15/2014  . Chronic pain syndrome 07/15/2014  . Acute systolic heart failure ([place]) 07/14/2014  . Dyspnea   . Congestive dilated cardiomyopathy ([place])   . Shock circulatory ([place]) 07/13/2014  . Encephalopathy acute 07/13/2014  . Pleural effusion 07/11/2014  . DM2 (diabetes mellitus, type 2) ([place]) 07/11/2014  . Essential hypertension 07/11/2014  . Hypothyroidism 07/11/2014  . NSTEMI (non-ST elevated  myocardial infarction) ([place]) 07/11/2014  . SOB (shortness of breath) 07/11/2014  . Acquired hypothyroidism 07/11/2014  . Chest pain   . Other specified hypotension   . Breast cancer metastasized to liver ([place])   . Fibrositis 08/06/2013  . Diabetes mellitus ([place]) 08/06/2013  . Fibromyalgia 08/06/2013  . Personal history of other diseases of the digestive system 08/06/2013  . Obstructive apnea 08/06/2013  . Lumbar canal stenosis 10/25/2012  . Degeneration of intervertebral disc of lumbosacral region 10/05/2012  . Rheumatoid arthritis ([place]) 10/05/2012  . 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 Pernicious anemia 05/30/2011  . Anemia 05/29/2011  . Sleep apnea 05/29/2011  . Degenerative joint disease 05/16/2011  . Arthritis, degenerative 05/16/2011  . Adenocarcinoma, breast ([place]) 03/07/2011  . Rheumatoid arthritis(714.0) 03/07/2011  . Depression 03/07/2011  . Migraine 03/07/2011  . GERD (gastroesophageal reflux disease) 03/07/2011  . Acid reflux 03/07/2011  . Spinal stenosis with chronic low back pain     Past Surgical History:  Procedure Laterality Date  . BREAST BIOPSY Right 2008  . BREAST IMPLANT REMOVAL Left 2015  . BREAST RECONSTRUCTION Bilateral 2008; 2009   "w/the mastectomy; "  . BREAST RECONSTRUCTION Left ~ 2010   "it capsized"  . CARDIAC CATHETERIZATION    . CHOLECYSTECTOMY OPEN  06/29/85  . COLECTOMY  2011   "SBO"  . GASTRIC BYPASS  2006   "w/scope"  . LEFT HEART CATHETERIZATION WITH CORONARY ANGIOGRAM N/A 07/14/2014   Procedure: LEFT HEART CATHETERIZATION WITH CORONARY ANGIOGRAM;  Surgeon: Leonie Man, MD;  Location: [facility] CATH LAB;  Service: Cardiovascular;  Laterality: N/A;  . LIVER BIOPSY  04/2015  . MASTECTOMY Bilateral 2008  . MULTIPLE EXTRACTIONS WITH ALVEOLOPLASTY  06/08/2011   Procedure: MULTIPLE EXTRACION WITH ALVEOLOPLASTY;  Surgeon: Lenn Cal, DDS;  Location: [facility] ORS;  Service: Oral Surgery;  Laterality: N/A;  Extraction of tooth #'s 3,4,7 with alveoloplasty and Gross Debridement of Remaining Teeth  . [place] LIVER TUMOR  2011; 01/26/2012  . THYROID LOBECTOMY  02/16/2012   Procedure: THYROID LOBECTOMY;  Surgeon: Earnstine Regal, MD;  Location: [facility] ORS;  Service: General;  Laterality: Right;  . TONSILLECTOMY  1969    OB History    No data available       Home Medications    Prior to Admission medications   Medication Sig Start Date End Date Taking? Authorizing Provider  ampicillin (PRINCIPEN) 500 MG capsule TAKE ONE CAPSULE BY MOUTH FOUR TIMES DAILY 02/01/16   Volanda Napoleon, MD    capecitabine (XELODA) 500 MG tablet Take 4 capsules TWICE a day with meals for 14 days. 12/23/15   Volanda Napoleon, MD  carvedilol (COREG) 6.25 MG tablet TAKE 1 TABLET BY MOUTH TWICE DAILY 01/19/16   Volanda Napoleon, MD  cyanocobalamin (,VITAMIN B-12,) 1000 MCG/ML injection INJECT 1 ML AS DIRECTED EVERY 21 DAYS 03/18/15   Timnath, NP  dexamethasone (DECADRON) 4 MG tablet TAKE 2 TABLETS BY MOUTH TWICE DAILY WITH A MEAL STARTING THE DAY AFTER CHEMOTHERAPY. 01/26/16   Volanda Napoleon, MD  diphenoxylate-atropine (LOMOTIL) 2.5-0.025 MG tablet TAKE 1 TABLET BY MOUTH FOUR TIMES DAILY AS NEEDED FOR DIARRHEA 01/18/16   Volanda Napoleon, MD  fentaNYL (DURAGESIC - DOSED MCG/HR) 25 MCG/HR patch Place 1 patch (25 mcg total) onto the skin every other day. 01/20/16   Volanda Napoleon, MD  fentaNYL (DURAGESIC - DOSED MCG/HR) 50 MCG/HR Place 2 patches (100 mcg total) onto the skin  every other day. 01/20/16   Volanda Napoleon, MD  FLUoxetine (PROZAC) 40 MG capsule Take 40 mg by mouth daily before breakfast.     Historical Provider, MD  furosemide (LASIX) 20 MG tablet Take 60 mg by mouth daily.     Historical Provider, MD  levothyroxine (SYNTHROID, LEVOTHROID) 150 MCG tablet Take 1 tablet (150 mcg total) by mouth daily. 01/20/16   Volanda Napoleon, MD  LORazepam (ATIVAN) 0.5 MG tablet PLACE 1 TABLET UNDER TONGUE EVERY 6 HOURS AS NEEDED FOR NAUSEA AND VOMITING 01/20/16   Volanda Napoleon, MD  LYRICA 200 MG capsule TAKE 1 CAPSULE BY MOUTH TWICE DAILY. 01/12/16   Volanda Napoleon, MD  methocarbamol (ROBAXIN) 750 MG tablet TAKE 1 TABLET BY MOUTH THREE TIMES DAILY AS NEEDED 01/27/16   Volanda Napoleon, MD  Multiple Vitamin (MULTI-VITAMINS) TABS Take 1 tablet by mouth daily. 11/29/11   Historical Provider, MD  nitroGLYCERIN (NITROSTAT) 0.4 MG SL tablet Place 1 tablet (0.4 mg total) under the tongue every 5 (five) minutes as needed for chest pain. 07/18/14   Annita Brod, MD  ondansetron (ZOFRAN) 8 MG tablet TAKE 1 TABLET BY  MOUTH EVERY 8 HOURS AS NEEDED FOR NAUSEA AND VOMITING 01/20/16   Volanda Napoleon, MD  oxyCODONE (ROXICODONE) 15 MG immediate release tablet Take 1 tablet (15 mg total) by mouth every 4 (four) hours as needed for pain. 01/20/16   Volanda Napoleon, MD  pantoprazole (PROTONIX) 40 MG tablet Take 1 tablet (40 mg total) by mouth 2 (two) times daily. 09/16/15   Volanda Napoleon, MD  polyethylene glycol [facility] Floria Raveling) packet Take 17 g by mouth 2 (two) times daily. 09/16/15   Volanda Napoleon, MD  potassium chloride SA (K-DUR,KLOR-CON) 20 MEQ tablet TAKE 2 TABLETS BY MOUTH TWICE DAILY 10/19/15   Jolaine Artist, MD  prochlorperazine (COMPAZINE) 10 MG tablet TAKE 1 TABLET BY MOUTH EVERY 6 HOURS AS NEEDED FOR NAUSEA AND VOMITING. 01/06/16   Volanda Napoleon, MD  promethazine (PHENERGAN) 25 MG tablet TAKE 1 TABLET BY MOUTH THREE TIMES DAILY AS NEEDED FOR NAUSEA. 10/27/15   Eliezer Bottom, NP  promethazine (PHENERGAN) 25 MG tablet TAKE 1 TABLET BY MOUTH EVERY 8 HOURS AS NEEDED FOR NAUSEA OR VOMITING. 01/12/16   Volanda Napoleon, MD  promethazine (PHENERGAN) 25 MG tablet TAKE 1 TABLET BY MOUTH EVERY 8 HOURS AS NEEDED FOR NAUSEA OR VOMITING. 01/13/16   Volanda Napoleon, MD  Suvorexant (BELSOMRA) 20 MG TABS Take 20 mg by mouth at bedtime as needed. 12/23/15   Volanda Napoleon, MD  temazepam (RESTORIL) 15 MG capsule Take 30 mg by mouth at bedtime.    Historical Provider, MD  tiZANidine (ZANAFLEX) 4 MG capsule TAKE 1 CAPSULE BY MOUTH EVERY 8 HOURS. 01/12/16   Volanda Napoleon, MD  tiZANidine (ZANAFLEX) 4 MG capsule TAKE 1 CAPSULE BY MOUTH EVERY 8 HOURS. 01/13/16   Volanda Napoleon, MD    Family History Family History  Problem Relation Age of Onset  . Cancer Mother     breast  . Hypertension Mother   . Anesthesia problems Daughter     Social History Social History  Substance Use Topics  . Smoking status: Never Smoker  . Smokeless tobacco: Never Used     Comment: NEVER USED TOBACCO  . Alcohol use No      Allergies   Influenza vac split [flu virus vaccine]; Pneumococcal vaccines; Ritalin [methylphenidate hcl]; Zostavax [zoster vaccine  live (oka-merck)]; and Adhesive [tape]   Review of Systems Review of Systems  Gastrointestinal: Positive for abdominal pain.  Musculoskeletal:       Rib pain   Neurological: Positive for weakness and headaches.  All other systems reviewed and are negative.    Physical Exam Updated Vital Signs BP 112/71   Pulse (!) 57   Temp 98 F (36.7 C) (Oral)   Resp 16   LMP 08/30/2006   SpO2 98%   Physical Exam  Constitutional: She is oriented to person, place, and time.  Chronically ill, uncomfortable   HENT:  R scalp hematoma, bruising R upper ear lobe with no obvious laceration.   Eyes: EOM are normal. Pupils are equal, round, and reactive to light.  Neck: Normal range of motion.  Cardiovascular: Normal rate, regular rhythm and normal heart sounds.   Pulmonary/Chest: Effort normal and breath sounds normal.  Tenderness l ribs, no obvious ecchymosis   Abdominal: Soft.  Ecchymosis L side of abdomen, + tenderness   Musculoskeletal: Normal range of motion.  Pelvis stable. Nl ROM bilateral hips   Neurological: She is alert and oriented to person, place, and time.  CN 2-12 intact. Nl strength upper extremities, 4/5 bilateral lower extremities (chronic per patient)   Skin: Skin is warm.  Psychiatric: She has a normal mood and affect.  Nursing note and vitals reviewed.    ED Treatments / Results  Labs (all labs ordered are listed, but only abnormal results are displayed) Labs Reviewed  CBC WITH DIFFERENTIAL/PLATELET - Abnormal; Notable for the following:       Result Value   RBC 2.91 (*)    Hemoglobin 9.8 (*)    HCT 29.2 (*)    MCV 100.3 (*)    RDW 18.0 (*)    Platelets 68 (*)    Lymphs Abs 0.5 (*)    All other components within normal limits  COMPREHENSIVE METABOLIC PANEL - Abnormal; Notable for the following:    Glucose, Bld 121 (*)     BUN 28 (*)    Calcium 8.0 (*)    Total Protein 5.5 (*)    Albumin 3.2 (*)    All other components within normal limits  I-STAT CHEM 8, ED - Abnormal; Notable for the following:    BUN 28 (*)    Glucose, Bld 117 (*)    Calcium, Ion 1.07 (*)    Hemoglobin 9.2 (*)    HCT 27.0 (*)    All other components within normal limits  URINALYSIS, ROUTINE W REFLEX MICROSCOPIC (NOT AT [facility])    EKG  EKG Interpretation None       Radiology Dg Chest 1 View  Result Date: 02/02/2016 CLINICAL DATA:  Increasing weakness, with recent fall. Receiving chemotherapy for breast cancer. EXAM: CHEST 1 VIEW COMPARISON:  04/19/2015. FINDINGS: Cardiomegaly. Port-A-Cath tip RIGHT atrium. No active infiltrates or failure. LEFT basilar subsegmental atelectasis. No visible pulmonary nodules. No visible rib fracture. Similar appearance to priors. IMPRESSION: Unremarkable supine AP chest. Electronically Signed   By: Staci Righter M.D.   On: 02/02/2016 15:13   Dg Pelvis 1-2 Views  Result Date: 02/02/2016 CLINICAL DATA:  Pain following fall.  History of breast carcinoma EXAM: PELVIS - 1-2 VIEW COMPARISON:  None. FINDINGS: There is no evidence of pelvic fracture or dislocation. Joint spaces appear normal. There is dextroscoliosis in the lower lumbar spine. There are no blastic or lytic bone lesions. IMPRESSION: No acute fracture or dislocation. No evident blastic or lytic bone lesions.  Hip joints appear symmetric bilaterally. Scoliosis in the lower lumbar spine region. Electronically Signed   By: Lowella Grip III M.D.   On: 02/02/2016 15:12   Ct Head Wo Contrast  Result Date: 02/02/2016 CLINICAL DATA:  61 y/o F; status post fall yesterday with head injury to the right parietal area with pain in the right occipital and right-sided head. Posterior neck pain. EXAM: CT HEAD WITHOUT CONTRAST CT CERVICAL SPINE WITHOUT CONTRAST TECHNIQUE: Multidetector CT imaging of the head and cervical spine was performed following the  standard protocol without intravenous contrast. Multiplanar CT image reconstructions of the cervical spine were also generated. COMPARISON:  04/19/2015 MRI of the brain. FINDINGS: CT HEAD FINDINGS Brain: No evidence of acute infarction, hemorrhage, hydrocephalus, extra-axial collection or mass lesion/mass effect. Mild chronic microvascular ischemic changes. Vascular: No hyperdense vessel. Mild calcific atherosclerosis of carotid siphons. Skull: Choose Sinuses/Orbits: Mild maxillary sinus mucosal thickening. Otherwise visualized paranasal sinuses and mastoid air cells are normally aerated. Orbits are unremarkable. Other: None. CT CERVICAL SPINE FINDINGS Alignment: Straightening of cervical lordosis with reversal at C5. Degenerative grade 1 C4-5 anterolisthesis. Skull base and vertebrae: No acute fracture. No primary bone lesion or focal pathologic process. Soft tissues and spinal canal: No prevertebral fluid or swelling. No visible canal hematoma. Disc levels: Multilevel cervical spondylosis greatest at the C5 through C7 levels where there is extensive discogenic disease and small marginal osteophytes. There is mild to moderate bony foraminal narrowing at the C6-7 level. No high-grade bony canal stenosis. Upper chest: Negative. Other: Calcific atherosclerosis of bilateral carotid bifurcations. Right IJ central line partially visualized. Patent airway. IMPRESSION: 1. No acute intracranial abnormality is identified. 2. Mild chronic microvascular ischemic changes of the brain are stable. 3. No acute fracture or dislocation of the cervical spine. 4. Cervical spine degenerative changes greatest at C5 through C7 Electronically Signed   By: Kristine Garbe M.D.   On: 02/02/2016 15:07   Ct Chest W Contrast  Result Date: 02/02/2016 CLINICAL DATA:  60 year old female with a history of metastatic breast cancer presents status post fall at home yesterday with head injury. EXAM: CT CHEST, ABDOMEN, AND PELVIS WITH  CONTRAST TECHNIQUE: Multidetector CT imaging of the chest, abdomen and pelvis was performed following the standard protocol during bolus administration of intravenous contrast. CONTRAST:  151mL ISOVUE-300 IOPAMIDOL (ISOVUE-300) INJECTION 61% COMPARISON:  10/26/2015 PET-CT. 04/20/2015 chest CT. 04/19/2015 CT abdomen/ pelvis. FINDINGS: CT CHEST FINDINGS Motion degraded scan. Cardiovascular: Stable mild cardiomegaly. No significant pericardial fluid/thickening. Right internal jugular MediPort terminates at the cavoatrial junction. Atherosclerotic nonaneurysmal thoracic aorta. Normal caliber pulmonary arteries. No evidence of acute thoracic aortic injury. No central pulmonary emboli. Mediastinum/Nodes: No pneumomediastinum. No mediastinal hematoma. Simple fluid lateral to the ascending thoracic aorta, unchanged back to 04/20/2015, favored represent a pericardial cyst or fluid in a pericardial recess. Right hemithyroidectomy. No discrete left thyroid lobe nodules. Unremarkable esophagus. No axillary, mediastinal or hilar lymphadenopathy. Lungs/Pleura: No pneumothorax. Trace bilateral layering pleural effusions. Mild compressive atelectasis in the dependent lower lobes. No acute consolidative airspace disease, lung masses or significant pulmonary nodules. Musculoskeletal: No aggressive appearing focal osseous lesions. Acute minimally displaced lateral right eighth rib fracture. No additional fracture in the chest. Moderate thoracic spondylosis. Partially visualized intact appearing right breast prosthesis. CT ABDOMEN PELVIS FINDINGS Hepatobiliary: There are at least 10 scattered hypodense lesions throughout the liver, which appear increased in size and number since 10/26/2015. A 6.4 x 3.9 cm liver mass in the medial segment left liver lobe (series 9/ image 42)  is increased from 5.3 x 2.7 cm. A 4.1 x 2.2 cm liver dome mass (series 9/ image 32), is increased from 2.3 x 1.7 cm. No liver laceration. Cholecystectomy .Bile  ducts are stable and within expected post cholecystectomy limits with common bile duct diameter 9 mm. No radiopaque choledocholithiasis. Pancreas: No pancreatic mass, duct dilation, laceration or peripancreatic fluid collections. Spleen: Normal size. No laceration or mass. Adrenals/Urinary Tract: Normal adrenals. Nonobstructing 5 mm lower left renal stone. No hydronephrosis. No renal laceration. No renal mass. Normal bladder. 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 Normal caliber small bowel with no small bowel wall thickening. Stable appearance of the appendix with no appendiceal wall thickening or significant periappendiceal fat stranding. Normal large bowel with no diverticulosis, large bowel wall thickening or pericolonic fat stranding. Vascular/Lymphatic: Normal caliber abdominal aorta. Patent portal, splenic and renal veins. No pathologically enlarged lymph nodes in the abdomen or pelvis. Reproductive:  Grossly normal uterus.  No adnexal mass. Other: No pneumoperitoneum. Trace simple free fluid in the lower peritoneal cavity anteriorly. Nonspecific caking in the right omentum appears new. Small midline supraumbilical ventral abdominal wall hernia contains a small portion of the sigmoid colon, decreased in size. No associated colonic wall thickening or pneumatosis. Musculoskeletal: No aggressive appearing focal osseous lesions. No fracture in the abdomen or pelvis. Severe lumbar spondylosis, not appreciably changed back to 04/19/2015. IMPRESSION: 1. Minimally displaced acute lateral right eighth rib fracture. No pneumothorax . 2. Otherwise no acute traumatic injuries in the chest, abdomen or pelvis. 3. Liver metastases have increased in size and number since 10/26/2015 PET-CT study . 4. New nonspecific caking in the right omentum, worrisome for peritoneal carcinomatosis. New simple  fluid density small volume ascites in the anterior lower abdomen. 5. Additional findings include stable mild cardiomegaly, aortic atherosclerosis, trace layering bilateral pleural effusions and nonobstructing left renal stone. Electronically Signed   By: Ilona Sorrel M.D.   On: 02/02/2016 15:29   Ct Cervical Spine Wo Contrast  Result Date: 02/02/2016 CLINICAL DATA:  60 y/o F; status post fall yesterday with head injury to the right parietal area with pain in the right occipital and right-sided head. Posterior neck pain. EXAM: CT HEAD WITHOUT CONTRAST CT CERVICAL SPINE WITHOUT CONTRAST TECHNIQUE: Multidetector CT imaging of the head and cervical spine was performed following the standard protocol without intravenous contrast. Multiplanar CT image reconstructions of the cervical spine were also generated. COMPARISON:  04/19/2015 MRI of the brain. FINDINGS: CT HEAD FINDINGS Brain: No evidence of acute infarction, hemorrhage, hydrocephalus, extra-axial collection or mass lesion/mass effect. Mild chronic microvascular ischemic changes. Vascular: No hyperdense vessel. Mild calcific atherosclerosis of carotid siphons. Skull: Choose Sinuses/Orbits: Mild maxillary sinus mucosal thickening. Otherwise visualized paranasal sinuses and mastoid air cells are normally aerated. Orbits are unremarkable. Other: None. CT CERVICAL SPINE FINDINGS Alignment: Straightening of cervical lordosis with reversal at C5. Degenerative grade 1 C4-5 anterolisthesis. Skull base and vertebrae: No acute fracture. No primary bone lesion or focal pathologic process. Soft tissues and spinal canal: No prevertebral fluid or swelling. No visible canal hematoma. Disc levels: Multilevel cervical spondylosis greatest at the C5 through C7 levels where there is extensive discogenic disease and small marginal osteophytes. There is mild to moderate bony foraminal narrowing at the C6-7 level. No high-grade bony canal stenosis. Upper chest: Negative. Other:  Calcific atherosclerosis of bilateral carotid bifurcations. Right IJ central line partially visualized. Patent airway. IMPRESSION: 1. No acute intracranial abnormality  is identified. 2. Mild chronic microvascular ischemic changes of the brain are stable. 3. No acute fracture or dislocation of the cervical spine. 4. Cervical spine degenerative changes greatest at C5 through C7 Electronically Signed   By: Kristine Garbe M.D.   On: 02/02/2016 15:07   Ct Abdomen Pelvis W Contrast  Result Date: 02/02/2016 CLINICAL DATA:  60 year old female with a history of metastatic breast cancer presents status post fall at home yesterday with head injury. EXAM: CT CHEST, ABDOMEN, AND PELVIS WITH CONTRAST TECHNIQUE: Multidetector CT imaging of the chest, abdomen and pelvis was performed following the standard protocol during bolus administration of intravenous contrast. CONTRAST:  145mL ISOVUE-300 IOPAMIDOL (ISOVUE-300) INJECTION 61% COMPARISON:  10/26/2015 PET-CT. 04/20/2015 chest CT. 04/19/2015 CT abdomen/ pelvis. FINDINGS: CT CHEST FINDINGS Motion degraded scan. Cardiovascular: Stable mild cardiomegaly. No significant pericardial fluid/thickening. Right internal jugular MediPort terminates at the cavoatrial junction. Atherosclerotic nonaneurysmal thoracic aorta. Normal caliber pulmonary arteries. No evidence of acute thoracic aortic injury. No central pulmonary emboli. Mediastinum/Nodes: No pneumomediastinum. No mediastinal hematoma. Simple fluid lateral to the ascending thoracic aorta, unchanged back to 04/20/2015, favored represent a pericardial cyst or fluid in a pericardial recess. Right hemithyroidectomy. No discrete left thyroid lobe nodules. Unremarkable esophagus. No axillary, mediastinal or hilar lymphadenopathy. Lungs/Pleura: No pneumothorax. Trace bilateral layering pleural effusions. Mild compressive atelectasis in the dependent lower lobes. No acute consolidative airspace disease, lung masses or  significant pulmonary nodules. Musculoskeletal: No aggressive appearing focal osseous lesions. Acute minimally displaced lateral right eighth rib fracture. No additional fracture in the chest. Moderate thoracic spondylosis. Partially visualized intact appearing right breast prosthesis. CT ABDOMEN PELVIS FINDINGS Hepatobiliary: There are at least 10 scattered hypodense lesions throughout the liver, which appear increased in size and number since 10/26/2015. A 6.4 x 3.9 cm liver mass in the medial segment left liver lobe (series 9/ image 42) is increased from 5.3 x 2.7 cm. A 4.1 x 2.2 cm liver dome mass (series 9/ image 32), is increased from 2.3 x 1.7 cm. No liver laceration. Cholecystectomy .Bile ducts are stable and within expected post cholecystectomy limits with common bile duct diameter 9 mm. No radiopaque choledocholithiasis. Pancreas: No pancreatic mass, duct dilation, laceration or peripancreatic fluid collections. Spleen: Normal size. No laceration or mass. Adrenals/Urinary Tract: Normal adrenals. Nonobstructing 5 mm lower left renal stone. No hydronephrosis. No renal laceration. No renal mass. Normal bladder. 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 Normal caliber small bowel with no small bowel wall thickening. Stable appearance of the appendix with no appendiceal wall thickening or significant periappendiceal fat stranding. Normal large bowel with no diverticulosis, large bowel wall thickening or pericolonic fat stranding. Vascular/Lymphatic: Normal caliber abdominal aorta. Patent portal, splenic and renal veins. No pathologically enlarged lymph nodes in the abdomen or pelvis. Reproductive:  Grossly normal uterus.  No adnexal mass. Other: No pneumoperitoneum. Trace simple free fluid in the lower peritoneal cavity anteriorly. Nonspecific caking in the right omentum appears new.  Small midline supraumbilical ventral abdominal wall hernia contains a small portion of the sigmoid colon, decreased in size. No associated colonic wall thickening or pneumatosis. Musculoskeletal: No aggressive appearing focal osseous lesions. No fracture in the abdomen or pelvis. Severe lumbar spondylosis, not appreciably changed back to 04/19/2015. IMPRESSION: 1. Minimally displaced acute lateral right eighth rib fracture. No pneumothorax . 2. Otherwise no acute traumatic injuries in the chest, abdomen or pelvis. 3. Liver metastases have increased in size and number  since 10/26/2015 PET-CT study . 4. New nonspecific caking in the right omentum, worrisome for peritoneal carcinomatosis. New simple fluid density small volume ascites in the anterior lower abdomen. 5. Additional findings include stable mild cardiomegaly, aortic atherosclerosis, trace layering bilateral pleural effusions and nonobstructing left renal stone. Electronically Signed   By: Ilona Sorrel M.D.   On: 02/02/2016 15:29    Procedures Procedures (including critical care time)  Medications Ordered in ED Medications  fentaNYL (SUBLIMAZE) injection 100 mcg (100 mcg Intravenous Given 02/02/16 1353)  sodium chloride 0.9 % bolus 1,000 mL (0 mLs Intravenous Stopped 02/02/16 1614)  iopamidol (ISOVUE-300) 61 % injection 100 mL (100 mLs Intravenous Contrast Given 02/02/16 1428)  fentaNYL (SUBLIMAZE) injection 50 mcg (50 mcg Intravenous Given 02/02/16 1558)     Initial Impression / Assessment and Plan / ED Course  I have reviewed the triage vital signs and the nursing notes.  Pertinent labs & imaging results that were available during my care of the patient were reviewed by me and considered in my medical decision making (see chart for details).  Clinical Course    Kerri Vasquez is a 60 y.o. female here with fall with evidence of head injury, rib and abdominal injuries. Denies passing out. Will get trauma scan, labs, xrays. She  has fentanyl patch on but still has pain so will access her port and give IV fentanyl.  4:14 PM Trauma workup showed R lateral rib fracture with known cancer and some ometal involvement. The mets have increased. Given multiple rounds of fentanyl but pain is not under control. She already has fentanyl patch and oxycodone. Will admit for pain control.    Final Clinical Impressions(s) / ED Diagnoses   Final diagnoses:  None    New Prescriptions New Prescriptions   No medications on file     Drenda Freeze, MD 02/02/16 1616

## 2016-02-02 NOTE — ED Triage Notes (Signed)
Pt reports pain on L flank/abd/hip area (large bruise here), R flank/abd, and R head just above ear (bruising here as well). No LOC. Has felt weak recently. Pt normally non-ambulatory and has limited feeling in her extremities since chemo. Last chemo 3 weeks ago.

## 2016-02-02 NOTE — Progress Notes (Signed)
EDCM spoke to patient and her daughter at bedside.  Patient lives at home with her daughter [name] [phone] and her 56 year grandson.  Patient's daughter reports she works full time but works from home.  Patient has a walker, cane and bedside commode at home.  Patient's daughter voiced interest in a wheelchair for patient and possibly a hoyer lift. Patient has been requiring assistance with ADL's at home.  Patent's daughter reports the last time the patient was able to walk on her own was 2-3 weeks ago.  Patient's daughter reports they have stopped patient's chemo for now as they want to do a liver catheterization on Oct 31 with Dr. Yisroel Ramming.  Patient confirms her pcp is Dr. Precious Haws.  Patient's daughter reports patient has had [facility] or [facility] in the past for home health services.  EDCM provided patient with list of home health agencies in [place] explained services.  Patient and daughter thankful for services.  No further EDCM needs at this time.

## 2016-02-02 NOTE — Progress Notes (Signed)
CSW attempted to speak with patient. Nurse Techs were assisting patient at the time.  Genice Rouge [id number] ED CSW 02/02/2016 2:28 PM

## 2016-02-03 ENCOUNTER — Observation Stay (HOSPITAL_COMMUNITY): Payer: Medicare Other

## 2016-02-03 ENCOUNTER — Observation Stay (HOSPITAL_BASED_OUTPATIENT_CLINIC_OR_DEPARTMENT_OTHER): Payer: Medicare Other

## 2016-02-03 DIAGNOSIS — R531 Weakness: Secondary | ICD-10-CM | POA: Diagnosis not present

## 2016-02-03 DIAGNOSIS — S3992XA Unspecified injury of lower back, initial encounter: Secondary | ICD-10-CM | POA: Diagnosis not present

## 2016-02-03 DIAGNOSIS — C50919 Malignant neoplasm of unspecified site of unspecified female breast: Secondary | ICD-10-CM | POA: Diagnosis not present

## 2016-02-03 DIAGNOSIS — Z9884 Bariatric surgery status: Secondary | ICD-10-CM

## 2016-02-03 DIAGNOSIS — L89152 Pressure ulcer of sacral region, stage 2: Secondary | ICD-10-CM | POA: Diagnosis not present

## 2016-02-03 DIAGNOSIS — I9589 Other hypotension: Secondary | ICD-10-CM

## 2016-02-03 DIAGNOSIS — S2231XA Fracture of one rib, right side, initial encounter for closed fracture: Secondary | ICD-10-CM | POA: Diagnosis not present

## 2016-02-03 DIAGNOSIS — D696 Thrombocytopenia, unspecified: Secondary | ICD-10-CM

## 2016-02-03 DIAGNOSIS — E538 Deficiency of other specified B group vitamins: Secondary | ICD-10-CM

## 2016-02-03 DIAGNOSIS — W19XXXA Unspecified fall, initial encounter: Secondary | ICD-10-CM | POA: Diagnosis not present

## 2016-02-03 DIAGNOSIS — Z85850 Personal history of malignant neoplasm of thyroid: Secondary | ICD-10-CM

## 2016-02-03 DIAGNOSIS — M4800 Spinal stenosis, site unspecified: Secondary | ICD-10-CM

## 2016-02-03 DIAGNOSIS — R627 Adult failure to thrive: Secondary | ICD-10-CM | POA: Diagnosis not present

## 2016-02-03 DIAGNOSIS — W19XXXD Unspecified fall, subsequent encounter: Secondary | ICD-10-CM | POA: Diagnosis not present

## 2016-02-03 DIAGNOSIS — L8994 Pressure ulcer of unspecified site, stage 4: Secondary | ICD-10-CM | POA: Insufficient documentation

## 2016-02-03 DIAGNOSIS — R609 Edema, unspecified: Secondary | ICD-10-CM

## 2016-02-03 LAB — COMPREHENSIVE METABOLIC PANEL
ALBUMIN: 2.6 g/dL — AB (ref 3.5–5.0)
ALT: 24 U/L (ref 14–54)
ANION GAP: 3 — AB (ref 5–15)
AST: 15 U/L (ref 15–41)
Alkaline Phosphatase: 38 U/L (ref 38–126)
BILIRUBIN TOTAL: 0.8 mg/dL (ref 0.3–1.2)
BUN: 26 mg/dL — ABNORMAL HIGH (ref 6–20)
CO2: 27 mmol/L (ref 22–32)
Calcium: 7.6 mg/dL — ABNORMAL LOW (ref 8.9–10.3)
Chloride: 111 mmol/L (ref 101–111)
Creatinine, Ser: 0.94 mg/dL (ref 0.44–1.00)
GFR calc Af Amer: >60 mL/min (ref >60)
GLUCOSE: 98 mg/dL (ref 65–99)
POTASSIUM: 4.2 mmol/L (ref 3.5–5.1)
Sodium: 141 mmol/L (ref 135–145)
TOTAL PROTEIN: 4.5 g/dL — AB (ref 6.5–8.1)

## 2016-02-03 LAB — CBC
HEMATOCRIT: 26.8 % — AB (ref 36.0–46.0)
Hemoglobin: 8.9 g/dL — ABNORMAL LOW (ref 12.0–15.0)
MCH: 34.9 pg — ABNORMAL HIGH (ref 26.0–34.0)
MCHC: 33.2 g/dL (ref 30.0–36.0)
MCV: 105.1 fL — ABNORMAL HIGH (ref 78.0–100.0)
Platelets: 59 10*3/uL — ABNORMAL LOW (ref 150–400)
RBC: 2.55 MIL/uL — ABNORMAL LOW (ref 3.87–5.11)
RDW: 18.6 % — AB (ref 11.5–15.5)
WBC: 4.3 10*3/uL (ref 4.0–10.5)

## 2016-02-03 LAB — MRSA PCR SCREENING: MRSA BY PCR: POSITIVE — AB

## 2016-02-03 LAB — PROCALCITONIN

## 2016-02-03 LAB — LACTIC ACID, PLASMA: Lactic Acid, Venous: 1.1 mmol/L (ref 0.5–1.9)

## 2016-02-03 MED ORDER — PREMIER PROTEIN SHAKE
11.0000 [oz_av] | Freq: Three times a day (TID) | ORAL | Status: DC
  Administered 2016-02-03 – 2016-02-09 (×12): 11 [oz_av] via ORAL
  Filled 2016-02-03 (×5): qty 325.31

## 2016-02-03 MED ORDER — PANCRELIPASE (LIP-PROT-AMYL) 12000-38000 UNITS PO CPEP
24000.0000 [IU] | ORAL_CAPSULE | Freq: Three times a day (TID) | ORAL | Status: DC
Start: 2016-02-03 — End: 2016-02-09
  Administered 2016-02-03 – 2016-02-09 (×18): 24000 [IU] via ORAL
  Filled 2016-02-03 (×20): qty 2

## 2016-02-03 MED ORDER — GADOBENATE DIMEGLUMINE 529 MG/ML IV SOLN
20.0000 mL | Freq: Once | INTRAVENOUS | Status: AC | PRN
  Administered 2016-02-03: 20 mL via INTRAVENOUS

## 2016-02-03 MED ORDER — LORAZEPAM 2 MG/ML IJ SOLN
0.5000 mg | Freq: Once | INTRAMUSCULAR | Status: AC
  Administered 2016-02-03: 0.5 mg via INTRAVENOUS
  Filled 2016-02-03: qty 1

## 2016-02-03 MED ORDER — MUPIROCIN 2 % EX OINT
1.0000 "application " | TOPICAL_OINTMENT | Freq: Two times a day (BID) | CUTANEOUS | Status: AC
  Administered 2016-02-03 – 2016-02-08 (×10): 1 via NASAL
  Filled 2016-02-03: qty 22

## 2016-02-03 MED ORDER — SODIUM CHLORIDE 0.9 % IV SOLN: INTRAVENOUS | Status: DC

## 2016-02-03 MED ORDER — CHLORHEXIDINE GLUCONATE CLOTH 2 % EX PADS
6.0000 | MEDICATED_PAD | Freq: Every day | CUTANEOUS | Status: AC
  Administered 2016-02-05 – 2016-02-07 (×3): 6 via TOPICAL

## 2016-02-03 NOTE — Progress Notes (Signed)
I saw [name] this AM.  She was going down to MRI.  I am very perplexed as to why her platelets are so low.  She is not on chemo.  She has really had no change in medications.    I spoke with her daughter this AM.  I will provide a full note later on when the results of the MRI are back!!  I totally appreciate everybody's help!!!  Lattie Haw, MD

## 2016-02-03 NOTE — Consult Note (Signed)
Referral MD  Reason for Referral: Metastatic breast cancer, weakness, chronic low back pain secondary to severe arthritis   Chief Complaint  Patient presents with  . Fall  . Weakness  : I at home.  HPI: [name] is well-known to me. She is a 60 year old postmenopausal Caucasian female. I've known her for many years. She has metastatic breast cancer. She has been on multiple cycles of chemotherapy. We tried her most recently on Xeloda which she tolerated very poorly. I think she got her first and only dose back in late August. She cannot even tolerate one cycle. She had diarrhea. She had some weakness.  She has severe back problems. This is not from malignancy. She has severe degenerative changes.  I she also may have some rheumatologic issue.  She has a history of gastric bypass. She gets B-12 and iron.  She had been getting weaker at home. She been having some dizziness. She fell area and her daughter finally was able to convince her to go to the hospital. She was admitted yesterday.  On admission, her labs showed Weisser, 5.3. Hemoglobin 9.8. Platelet count was 60,000. Her routine was 5.5. Albumin 3.2. Her sodium was 138 potassium 4.4.  She has CT scan done. The CT scan showed increase in liver metastasis. She had a right eighth rib fracture. She has some thickening of the right omentum.  CT of the head was negative. She had no fracture of the cervical spine. She had degenerative changes.  She had an MRI of the lumbar spine and thoracic spine. She has severe degenerative changes. She has severe spinal stenosis. She has weakness in her legs.  She was seen by neurosurgery. They do not think that her symptoms were from the lumbar spine degenerative changes.  She really has a marginal, at best, performance status. Her appetite is okay. She's had no nausea. Maybe some occasional vomiting.  Her last CA 27.29 in early August was 291.  There's been no cough. She's had no obvious  bleeding. She's had no rashes.  Currently, I would say that her performance status is probably ECOG 3.    Past Medical History:  Diagnosis Date  . Anemia   . Breast cancer, right breast ([place]) dx'd 2008   right  . CHF (congestive heart failure) ([place])   . Chronic lower back pain   . Depression   . Diabetes mellitus    type II, controlled by diet 05/25/11   . Fibromyalgia   . Fracture of multiple toes    right toes x 4 toes- excluding small toe  . GERD (gastroesophageal reflux disease)   . Hypertension   . Hypothyroidism   . Incisional hernia   . Intestinal obstruction   . Iron deficiency anemia due to chronic blood loss 07/29/2015  . 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 Migraine    "weekly; but they don't last long" (08/26/2014)  . Myocardial infarction 06/2014   S/P percutaneous intervention procedure a metastatic lesion in the liver  . Nausea and vomiting 04/2015  . Osteoarthritis of multiple joints   . Pernicious anemia 05/30/2011   Iron transfusion and VB12 on 06/06/11  . Renal insufficiency    hx renal failure 2011  . Sciatica   . Scoliosis   . Septic arthritis ([place]) 01/28/10   and spinal stenosis , moderate scoliosis   . Sleep apnea    hx of off machine x 4 years   . Spinal  stenosis   . Thyroid cancer [facility]) 2013  :  Past Surgical History:  Procedure Laterality Date  . BREAST BIOPSY Right 2008  . BREAST IMPLANT REMOVAL Left 2015  . BREAST RECONSTRUCTION Bilateral 2008; 2009   "w/the mastectomy; "  . BREAST RECONSTRUCTION Left ~ 2010   "it capsized"  . CARDIAC CATHETERIZATION    . CHOLECYSTECTOMY OPEN  06/29/85  . COLECTOMY  2011   "SBO"  . GASTRIC BYPASS  2006   "w/scope"  . LEFT HEART CATHETERIZATION WITH CORONARY ANGIOGRAM N/A 07/14/2014   Procedure: LEFT HEART CATHETERIZATION WITH CORONARY ANGIOGRAM;  Surgeon: Leonie Man, MD;  Location: [facility] CATH LAB;  Service: Cardiovascular;  Laterality: N/A;  . LIVER BIOPSY  04/2015  .  MASTECTOMY Bilateral 2008  . MULTIPLE EXTRACTIONS WITH ALVEOLOPLASTY  06/08/2011   Procedure: MULTIPLE EXTRACION WITH ALVEOLOPLASTY;  Surgeon: Lenn Cal, DDS;  Location: [facility] ORS;  Service: Oral Surgery;  Laterality: N/A;  Extraction of tooth #'s 3,4,7 with alveoloplasty and Gross Debridement of Remaining Teeth  . [place] LIVER TUMOR  2011; 01/26/2012  . THYROID LOBECTOMY  02/16/2012   Procedure: THYROID LOBECTOMY;  Surgeon: Earnstine Regal, MD;  Location: [facility] ORS;  Service: General;  Laterality: Right;  . TONSILLECTOMY  1969  :   Current Facility-Administered Medications:  .  0.9 %  sodium chloride infusion, , Intravenous, Continuous, Florencia Reasons, MD, Last Rate: 75 mL/hr at 02/03/16 1608 .  fentaNYL (DURAGESIC - dosed mcg/hr) 100 mcg, 100 mcg, Transdermal, Q48H, Florencia Reasons, MD, 100 mcg at 02/02/16 2141 .  FLUoxetine (PROZAC) capsule 40 mg, 40 mg, Oral, QAC breakfast, Florencia Reasons, MD, 40 mg at 02/03/16 1102 .  ibuprofen (ADVIL,MOTRIN) tablet 600 mg, 600 mg, Oral, Q6H PRN, Florencia Reasons, MD .  levothyroxine (SYNTHROID, LEVOTHROID) tablet 150 mcg, 150 mcg, Oral, QAC breakfast, Florencia Reasons, MD, 150 mcg at 02/03/16 1102 .  lidocaine-prilocaine (EMLA) cream, , Topical, Once, Florencia Reasons, MD .  lipase/protease/amylase (CREON) capsule 24,000 Units, 24,000 Units, Oral, TID AC, Volanda Napoleon, MD, 24,000 Units at 02/03/16 1103 .  LORazepam (ATIVAN) tablet 0.5 mg, 0.5 mg, Oral, Q6H PRN, Florencia Reasons, MD, 0.5 mg at 02/02/16 2141 .  methocarbamol (ROBAXIN) tablet 750 mg, 750 mg, Oral, Q8H PRN, Florencia Reasons, MD .  multivitamin with minerals tablet 1 tablet, 1 tablet, Oral, Daily, Florencia Reasons, MD, 1 tablet at 02/03/16 1102 .  nitroGLYCERIN (NITROSTAT) SL tablet 0.4 mg, 0.4 mg, Sublingual, Q5 min PRN, Florencia Reasons, MD .  ondansetron [facility]) tablet 4 mg, 4 mg, Oral, Q8H PRN, Florencia Reasons, MD .  oxyCODONE (Oxy IR/ROXICODONE) immediate release tablet 15 mg, 15 mg, Oral, Q4H PRN, Florencia Reasons, MD, 15 mg at 02/02/16 2141 .  pantoprazole (PROTONIX)  EC tablet 40 mg, 40 mg, Oral, BID, Florencia Reasons, MD, 40 mg at 02/03/16 1102 .  pregabalin (LYRICA) capsule 200 mg, 200 mg, Oral, BID, Florencia Reasons, MD, 200 mg at 02/03/16 1102 .  protein supplement (PREMIER PROTEIN) liquid, 11 oz, Oral, TID BM, Rosezetta Schlatter, RD .  Suvorexant TABS 20 mg, 20 mg, Oral, QHS PRN, Florencia Reasons, MD .  temazepam (RESTORIL) capsule 30 mg, 30 mg, Oral, QHS, Florencia Reasons, MD, 30 mg at 02/02/16 2140 .  tiZANidine (ZANAFLEX) tablet 4 mg, 4 mg, Oral, BID, Florencia Reasons, MD, 4 mg at 02/03/16 1102:  . fentaNYL  100 mcg Transdermal Q48H  . FLUoxetine  40 mg Oral QAC breakfast  . levothyroxine  150 mcg Oral QAC breakfast  .  lidocaine-prilocaine   Topical Once  . lipase/protease/amylase  24,000 Units Oral TID AC  . multivitamin with minerals  1 tablet Oral Daily  . pantoprazole  40 mg Oral BID  . pregabalin  200 mg Oral BID  . protein supplement shake  11 oz Oral TID BM  . temazepam  30 mg Oral QHS  . tiZANidine  4 mg Oral BID  :  Allergies  Allergen Reactions  . Influenza Vac Split [Flu Virus Vaccine] Other (See Comments)    Joint stiffness and renal failure  . Pneumococcal Vaccines Other (See Comments)    "sepsis and renal failure"  . Ritalin [Methylphenidate Hcl]     "Heart attack"  . Zostavax [Zoster Vaccine Live ([employer])] Other (See Comments)    Joint stiffness, renal failure  . Adhesive [Tape] Itching and Rash  :  Family History  Problem Relation Age of Onset  . Cancer Mother     breast  . Hypertension Mother   . Anesthesia problems Daughter   :  Social History   Social History  . Marital status: Divorced    Spouse name: N/A  . Number of children: 1  . Years of education: N/A   Occupational History  . Not on file.   Social History Main Topics  . Smoking status: Never Smoker  . Smokeless tobacco: Never Used     Comment: NEVER USED TOBACCO  . Alcohol use No  . Drug use: No  . Sexual activity: Not Currently   Other Topics Concern  . Not on file   Social  History Narrative   Caffeine use: 3 cups coffee/tea daily   Regular exercise: no        :  Pertinent items are noted in HPI.  Exam: Patient Vitals for the past 24 hrs:  BP Temp Temp src Pulse Resp SpO2 Height Weight  02/03/16 1400 (!) 86/40 97.7 F (36.5 C) Oral 65 - 95 % - -  02/03/16 0555 (!) 86/60 97.5 F (36.4 C) Oral 60 14 98 % - -  02/02/16 2134 95/72 97.6 F (36.4 C) Oral 77 16 100 % - -  02/02/16 1902 (!) 94/38 - - 98 18 100 % 5\' 6"  (1.676 m) 283 lb 15.2 oz (128.8 kg)  02/02/16 1814 113/74 - - 75 15 94 % - -  02/02/16 1730 120/75 - - - - - - -  02/02/16 1700 117/74 - - (!) 54 - 96 % - -  02/02/16 1630 127/87 - - (!) 54 - 97 % - -    Obese white female in no obvious distress. She is alert and oriented. Head and neck exam shows no ocular or oral lesions. Her pupils reacted properly. She has no adenopathy in the neck. Lungs are with decreased breath sounds at the bases. Cardiac exam regular rate and rhythm with no murmurs, rubs or bruits. Abdomen is soft. She is obese. There is no guarding or rebound tenderness. There is no palpable hepatomegaly. She does have some ecchymoses on the left flank. Extremities shows some chronic pitting edema of the lower legs. She really cannot raise her legs up. She has decent plantar and dorsiflexion of her feet. She can feel her legs okay. Neurological exam shows the weakness in the legs.    Recent Labs  02/02/16 1321 02/02/16 1341 02/03/16 0450  WBC 5.3  --  4.3  HGB 9.8* 9.2* 8.9*  HCT 29.2* 27.0* 26.8*  PLT 68*  --  59*    Recent  Labs  02/02/16 1321 02/02/16 1341 02/03/16 0450  NA 138 140 141  K 4.4 4.4 4.2  CL 108 107 111  CO2 25  --  27  GLUCOSE 121* 117* 98  BUN 28* 28* 26*  CREATININE 0.87 1.00 0.94  CALCIUM 8.0*  --  7.6*    Blood smear review:  None  Pathology: None     Assessment and Plan:  [name] is a 60 year old white female. She has metastatic breast cancer.  I realize that her cancer is  progressing. She really has a very poor tolerance to therapy. She cannot tolerate Xeloda. I think that if her going to try to help her, we might incinerator single agent Eribulin. Single agent Navelbine also could be considered.  I'm not sure exactly why she has a weakness in her legs. I would think that this would be from severe spinal stenosis. However, neurosurgery thinks otherwise.  It would be reasonable to do a myelogram. I know that this is "old school" but yet this could give information regarding the amount of stenosis area did unfortunately, her platelet count is on the low side. I'm not sure as to why her platelets are on the low side. She is not had Xeloda for several weeks. I would think that it would be out of her system by now. However, she might be one of these patients who is a "slow metabolizer" and the Xeloda might take longer to get out of the system.  I would do an MRI of the brain. I would want to make sure that there is no type of leptomeningeal disease. I would think that this would be seen on the lumbar or thoracic MRI that she had done earlier today.  I'll have to look at her blood under the microscope. I would not think that she would have marrow infiltration by malignancy. However, this is a possibility with metastatic breast cancer.  I am also going to check her vitamin B-12 level. It was okay a few months ago so I would not think that it would be that low that would cause neurological changes.  I would not think that this would be paraneoplastic. With breast cancer, this is a remote possibility although it would be hard to prove. I know that there is a panel of paraneoplastic antibodies that can be run. I would think that this would be a very low yield.  I did speak with her daughter today. I gave her update as to what was going on from my point of view.  I appreciate all the help that she is being getting from everybody up on [address]. I will certainly do what I can to try  to make her life better so that she can have a good quality of life.  Lattie Haw, MD  Psalm 37:3-4

## 2016-02-03 NOTE — Evaluation (Signed)
Physical Therapy Evaluation Patient Details Name: Kerri Vasquez MRN: [id number] DOB: 07-Jun-1955 Today's Date: 02/03/2016   History of Present Illness  60yo F adm With metastatic breast cancer ,currently off therapy, reported progressive weakness, has been in bed for the last three weeks, she fell out of bed yesterday and hit her abdomen and sustained nondisplaced right eighth rib fracture, she presented to the ED 02/02/16 due to increased pain.  Clinical Impression  The patient is seveerly deconditioned, has not been able to mobilize for several weeks from the bed, has fallen/ slipped from bed several times. No family present. Anticipate will be difficulty to mobilize to sitting up due to habitus and pain . Pt admitted with above diagnosis. Pt currently with functional limitations due to the deficits listed below (see PT Problem List).  Pt will benefit from skilled PT to increase their independence and safety with mobility to allow discharge to the venue listed below.       Follow Up Recommendations SNF;Home health PT    Equipment Recommendations  Wheelchair (measurements PT);Wheelchair cushion (measurements PT);Hospital bed (hoyer lift)    Recommendations for Other Services       Precautions / Restrictions Precautions Precautions: Fall Precaution Comments: multiple falls, slide of and fell off      Mobility  Bed Mobility Overal bed mobility: Needs Assistance;+2 for physical assistance;+ 2 for safety/equipment Bed Mobility: Rolling Rolling: Total assist;Max assist;+2 for physical assistance;+2 for safety/equipment         General bed mobility comments: total A +2 to left, max A +2 to right  Transfers                 General transfer comment: unable  Ambulation/Gait                Stairs            Wheelchair Mobility    Modified Rankin (Stroke Patients Only)       Balance Overall balance assessment: History of Falls                                            Pertinent Vitals/Pain Pain Assessment: 0-10 Pain Score: 9  Pain Location: back and r side    Home Living Family/patient expects to be discharged to:: Private residence Living Arrangements: Children Available Help at Discharge: Family Type of Home: House Home Access: Stairs to enter   Technical brewer of Steps: 4 Home Layout: One level Home Equipment: Bedside commode;Walker - 2 wheels;Cane - single point;Wheelchair - manual      Prior Function Level of Independence: Needs assistance   Gait / Transfers Assistance Needed: BSC transfers with family until lately, bed bound.     Comments: daughter performs bed bath/getting dressed/toileting. Patient able to feed self in bed.     Hand Dominance   Dominant Hand: Right    Extremity/Trunk Assessment   Upper Extremity Assessment: RUE deficits/detail;Generalized weakness RUE Deficits / Details: limited shoulder AROM to 90 degrees         Lower Extremity Assessment: RLE deficits/detail;LLE deficits/detail RLE Deficits / Details: states "pins and needles in feet", grossly 2+/ 5 hip flexion, knee extension, dorsiflexion4/5 LLE Deficits / Details: same as right     Communication   Communication: No difficulties  Cognition Arousal/Alertness: Awake/alert Behavior During Therapy: WFL for tasks assessed/performed Overall Cognitive Status: Within Functional Limits for  tasks assessed                      General Comments General comments (skin integrity, edema, etc.): sacral dressing noted; bruises    Exercises     Assessment/Plan    PT Assessment Patient needs continued PT services  PT Problem List Decreased strength;Decreased range of motion;Decreased activity tolerance;Decreased mobility;Pain;Decreased skin integrity;Obesity          PT Treatment Interventions Functional mobility training;Therapeutic activities;Therapeutic exercise;Patient/family education     PT Goals (Current goals can be found in the Care Plan section)  Acute Rehab PT Goals Patient Stated Goal: get stronger PT Goal Formulation: With patient Time For Goal Achievement: 02/17/16 Potential to Achieve Goals: Poor    Frequency Min 3X/week   Barriers to discharge Decreased caregiver support;Inaccessible home environment      Co-evaluation PT/OT/SLP Co-Evaluation/Treatment: Yes Reason for Co-Treatment: Complexity of the patient's impairments (multi-system involvement);For patient/therapist safety PT goals addressed during session: Mobility/safety with mobility OT goals addressed during session: ADL's and self-care       End of Session   Activity Tolerance: Patient limited by pain Patient left: in bed;with call bell/phone within reach;with bed alarm set Nurse Communication: Mobility status    Functional Assessment Tool Used: clinical judgement Functional Limitation: Changing and maintaining body position Changing and Maintaining Body Position Current Status [id number]): 100 percent impaired, limited or restricted Changing and Maintaining Body Position Goal Status [id number]): At least 20 percent but less than 40 percent impaired, limited or restricted    Time: [id number] PT Time Calculation (min) (ACUTE ONLY): 20 min   Charges:   PT Evaluation $PT Eval Low Complexity: 1 Procedure     PT G Codes:   PT G-Codes **NOT FOR INPATIENT CLASS** Functional Assessment Tool Used: clinical judgement Functional Limitation: Changing and maintaining body position Changing and Maintaining Body Position Current Status [id number]): 100 percent impaired, limited or restricted Changing and Maintaining Body Position Goal Status [id number]): At least 20 percent but less than 40 percent impaired, limited or restricted    Marcelino Freestone PT [id number]  02/03/2016, 2:00 PM

## 2016-02-03 NOTE — Progress Notes (Signed)
Initial Nutrition Assessment  DOCUMENTATION CODES:   Morbid obesity  INTERVENTION:   - Provide Premier Protein oral nutrition supplement TID between meals. Each provides 160 kcal and 30 grams protein. - Ordered dinner meal for patient. - Continue to monitor for nutrition-related needs.  NUTRITION DIAGNOSIS:   Increased nutrient needs related to wound healing as evidenced by estimated needs.  GOAL:   Patient will meet greater than or equal to 90% of their needs  MONITOR:   PO intake, Supplement acceptance, Weight trends, Labs, Skin  REASON FOR ASSESSMENT:   Consult Assessment of nutrition requirement/status  ASSESSMENT:   60 yo F admitted with metastatic breast cancer, currently off therapy, reported progressive weakness, has been in bed for the last three weeks, she fell out of bed yesterday and hit her abdomen and ribs, she presented to the ED due to increased pain. Found to have minimally displaced R 8th rib fx.  Spoke with pt at bedside. Pt sleepy but able to answer questions appropriately. Pt reports varied appetite PTA but good appetite during admission. Pt states she is "starving" at time of visit. Per chart, pt consumed 100% of meal this AM. Pt reports having some days where she is extremely hungry and some days where she does not want to eat at all. Per pt, a typical breakfast would include scrambled eggs, fruit, toast, and coffee. Pt states that if her daughter fixes lunch, she will "pick at it." Pt reports consuming a high-protein dinner that might consist of pork chops, steak, or chicken. Pt also reports consuming snacks between meals. Pt reports foods having "dull flavors" when she was receiving chemotherapy but that this has resolved since chemotherapy has been d/c. Pt reports still having a good appetite during chemotherapy.  Per chart, pt has experienced recent weight gain. Pt denies taking any medications that affect her weight. Pt is s/p gastric bypass surgery in  2006.  Pt denies difficulties chewing and swallowing. Pt also denies taking any vitamin or mineral supplements PTA.  Pt interested in receiving oral nutrition supplement during admission. Will order Premier Protein oral nutrition supplement.  Ordered dinner meal per pt request: 1/2 portion pot roast and gravy, chicken noodle soup, baked sweet potato, corn, dinner roll, and grape juice.  Medications reviewed and include Synthroid tablet 150 mcg daily, Creon capsule 24,000 units 3x daily before meals, MVI with minerals daily, Protonix 40 mg BID  Labs reviewed and include elevated BUN (26 mg/dL), low calcium (7.6 mg/dL), glucose 98-121 mg/dL since admission No recent CBGs.  NFPE: Limited exam completed. No fat depletion, no muscle depletion, and unable to assess edema.  Diet Order:  Diet Heart Room service appropriate? Yes; Fluid consistency: Thin  Skin:  Wound (see comment) (stage II pressure injury to buttocks, stage II pressure injury to sacrum)  Last BM:  PTA  Height:   Ht Readings from Last 1 Encounters:  02/02/16 5\' 6"  (1.676 m)    Weight:   Wt Readings from Last 1 Encounters:  02/02/16 283 lb 15.2 oz (128.8 kg)    Ideal Body Weight:  59 kg  BMI:  Body mass index is 45.83 kg/m.  Estimated Nutritional Needs:   Kcal:  1930-2190 (15-17 kcal/kg)  Protein:  88-100 grams (1.5-1.7 g/kg IBW)  Fluid:  1.9-2.2 L/day  EDUCATION NEEDS:   No education needs identified at this time  Jeb Levering Dietetic Intern Pager Number: [phone]

## 2016-02-03 NOTE — Consult Note (Signed)
[facility] Nurse wound consult note Reason for Consult:Deep tissue pressure injury (DTPI) at the coccyx in the intragluteal skin fold, partial thickness tissue loss in the periwound area, at risk heels for pressure injury, moisture management issues in the sub-pannicular and inguinal skin folds resulting in intertriginous dermatitis Wound type:Pressure moisture Pressure Ulcer POA: Yes Measurement: Coccyx wound measures 3cm x 10cm with deeper center measuring 2cm x 1cm x 0.5cm at this time. Left buttocks with partial thickness tissue loss measures 5cm x 4cm x 0.2cm. Wound bed: central area with depth is purple/red, surrounding areas of partial thickness tissue loss are erythematous with red, moist base. Bilateral inguinal area and sub-pannicular areas are moist and weeping serous fluid. Drainage (amount, consistency, odor) moderate serous and serosanguinous Periwound: erythematous, partial thickness tissue loss from previous tape use. Dressing procedure/placement/frequency: While patient is on a therapeutic mattress with low air loss feature, she requires a bariatric bed and we will arrange for that today.  Bilateral Pressure redistribution heel boots will be provided as well.  Her heels are intact and blance, but with decreased mobility and a preference for the supine position, they are at risk. Additionally, the skin fold moisture issues will be addressed with our antimicrobial textile, InterDry Ag+. Most concerning is the deep tissue pressure injury with periwound partial thickness tissue loss; I have implemented daily topical care with an antimicrobial (silver) dressing topped with a silicone foam for the sacral and coccyx areas.  Patient will have to turn side to side and minimize time spent in the supine position.  Discussed POC with bedside RN. [facility] nursing team will not follow routinely, but will remain available to this patient, the nursing and medical teams.  Please re-consult if ulcer deteriorates or if  new pressure related areas present or if intertriginous areas of dermatitis do not respond to interventions. Thanks, Maudie Flakes, MSN, RN, [place], [facility]  Pager# [phone]

## 2016-02-03 NOTE — Progress Notes (Addendum)
PROGRESS NOTE  Zoeie Sotelo Martinez-Mahjouba [id number] DOB: 1956/02/07 DOA: 02/02/2016 PCP: [name], MD  HPI/Recap of past 24 hours:  bp bordeline low, patient looks sleepy, drift back to sleep during conversation ,   but able to be awaken with conversation and oriented x3  Assessment/Plan: Active Problems:   Rib fracture   Fall   Pressure injury of skin   Fall with progressive weakness, now bed bound Has been bed bound for the last three weeks,  Ct cervical spine/mri thoracic/lumber spine no mets, fall precaution, PT/OT Unclear etiology for the progressive weakness, patient not have been bedbound, not able to sit up,  Will check ck and am cortisol level.  paraneoplastic syndrome? She does has elevated total protein with low albumin,  Will discuss with oncology Dr Marin Olp  Addendum: I have discussed with dr Martha Clan later this pm who recommended to get mri brain to r/o leptomingeal disease.   Thrombocytopenia:  acute, unclear etiology. She report has been off chemo for three weeks, currently on sign of infection, no acute bleed, CT ab does show numerous liver mets that is increasing in size, spleen is normal in size. enous [place] lower extremity no DVT,  She has been taking amoxicillin, side effect from abx? Will d/c amoxicillin, hem/oncology consulted. Dr Marin Olp will get blood smear, may need bone marrow, to be determined by Dr Marin Olp.  hypotension,  though patient does not look septic, d/c coreg,  Low bp from over sedation?, patient look drowsy, did not require additional prn pain meds, will taper fentanyl patch,   Start gentle hydration due to poor oral intake and hypotension ua and blood culture, check lactic acid, procalcitonin, am cortisol   Sacral decubitus ulcer presented on admission : skin care, she report pus has resolved since she is started on amoxicillin, will continue for now  Bilateral groin candidiasis, topical nytstin   Drowsiness: from  oversedation? CT head no acute findings  H/o diastolic chf,   she state she stopped taking lasix recently, her edema has much improved compare to before bp to low for coreg, on gentle hydration due to hypotension, close monitor volume status   H/o pernicious anemia: getting b12 shots, hgb seems to be stable at baseline.  Metastatic breast cancer, has progressed , with liver mets and possble mets to omentum. Now with progressive weakness and ECOG score has declined,  oncology Dr Martha Clan notified  DVT prophylaxis: scd due to thrombocytopenia  Consultants: hem/oncology  Code Status: full , confirmed with patient  Family Communication:  Patient   Disposition Plan: pending    Consultants:  Oncology Dr Marin Olp  Neurosurgery Dr Vertell Limber phone conversation regarding MRi spine findings  Procedures:  none  Antibiotics:  Amoxicillin ( continued from home meds), discontinued on 10/5   Objective: BP (!) 86/60 (BP Location: Left Arm)   Pulse 60   Temp 97.5 F (36.4 C) (Oral)   Resp 14   Ht 5\' 6"  (1.676 m)   Wt 128.8 kg (283 lb 15.2 oz)   LMP 08/30/2006   SpO2 98%   BMI 45.83 kg/m   Intake/Output Summary (Last 24 hours) at 02/03/16 0808 Last data filed at 02/02/16 1614  Gross per 24 hour  Intake              999 ml  Output              900 ml  Net  99 ml   Filed Weights   02/02/16 1902  Weight: 128.8 kg (283 lb 15.2 oz)    Exam:   General:  Chronically ill, obese, drowsy  Eyes: PERRL  ENT: unremarkable  Neck: supple, no JVD  Cardiovascular: RRR  Respiratory: diminished at basis, poor respiratory effort, no wheezing, no rales, no rhonchi  Abdomen: soft/ND/ND, positive bowel sounds  Skin: bilateral groin candidiasis changes, sacral decubitus ulcer presented on admission  Musculoskeletal:  No edema  Psychiatric: calm/cooperative  Neurologic: bilateral lower extremity not able to lift against gravity    Data Reviewed: Basic  Metabolic Panel:  Recent Labs Lab 02/02/16 1321 02/02/16 1341 02/03/16 0450  NA 138 140 141  K 4.4 4.4 4.2  CL 108 107 111  CO2 25  --  27  GLUCOSE 121* 117* 98  BUN 28* 28* 26*  CREATININE 0.87 1.00 0.94  CALCIUM 8.0*  --  7.6*   Liver Function Tests:  Recent Labs Lab 02/02/16 1321 02/03/16 0450  AST 15 15  ALT 29 24  ALKPHOS 44 38  BILITOT 1.0 0.8  PROT 5.5* 4.5*  ALBUMIN 3.2* 2.6*   No results for input(s): LIPASE, AMYLASE in the last 168 hours. No results for input(s): AMMONIA in the last 168 hours. CBC:  Recent Labs Lab 02/02/16 1321 02/02/16 1341 02/03/16 0450  WBC 5.3  --  4.3  NEUTROABS 4.6  --   --   HGB 9.8* 9.2* 8.9*  HCT 29.2* 27.0* 26.8*  MCV 100.3*  --  105.1*  PLT 68*  --  59*   Cardiac Enzymes:   No results for input(s): CKTOTAL, CKMB, CKMBINDEX, TROPONINI in the last 168 hours. BNP (last 3 results) No results for input(s): BNP in the last 8760 hours.  ProBNP (last 3 results) No results for input(s): PROBNP in the last 8760 hours.  CBG: No results for input(s): GLUCAP in the last 168 hours.  No results found for this or any previous visit (from the past 240 hour(s)).   Studies: Dg Chest 1 View  Result Date: 02/02/2016 CLINICAL DATA:  Increasing weakness, with recent fall. Receiving chemotherapy for breast cancer. EXAM: CHEST 1 VIEW COMPARISON:  04/19/2015. FINDINGS: Cardiomegaly. Port-A-Cath tip RIGHT atrium. No active infiltrates or failure. LEFT basilar subsegmental atelectasis. No visible pulmonary nodules. No visible rib fracture. Similar appearance to priors. IMPRESSION: Unremarkable supine AP chest. Electronically Signed   By: Staci Righter M.D.   On: 02/02/2016 15:13   Dg Pelvis 1-2 Views  Result Date: 02/02/2016 CLINICAL DATA:  Pain following fall.  History of breast carcinoma EXAM: PELVIS - 1-2 VIEW COMPARISON:  None. FINDINGS: There is no evidence of pelvic fracture or dislocation. Joint spaces appear normal. There is  dextroscoliosis in the lower lumbar spine. There are no blastic or lytic bone lesions. IMPRESSION: No acute fracture or dislocation. No evident blastic or lytic bone lesions. Hip joints appear symmetric bilaterally. Scoliosis in the lower lumbar spine region. Electronically Signed   By: Lowella Grip III M.D.   On: 02/02/2016 15:12   Ct Head Wo Contrast  Result Date: 02/02/2016 CLINICAL DATA:  60 y/o F; status post fall yesterday with head injury to the right parietal area with pain in the right occipital and right-sided head. Posterior neck pain. EXAM: CT HEAD WITHOUT CONTRAST CT CERVICAL SPINE WITHOUT CONTRAST TECHNIQUE: Multidetector CT imaging of the head and cervical spine was performed following the standard protocol without intravenous contrast. Multiplanar CT image reconstructions of the cervical spine were also  generated. COMPARISON:  04/19/2015 MRI of the brain. FINDINGS: CT HEAD FINDINGS Brain: No evidence of acute infarction, hemorrhage, hydrocephalus, extra-axial collection or mass lesion/mass effect. Mild chronic microvascular ischemic changes. Vascular: No hyperdense vessel. Mild calcific atherosclerosis of carotid siphons. Skull: Choose Sinuses/Orbits: Mild maxillary sinus mucosal thickening. Otherwise visualized paranasal sinuses and mastoid air cells are normally aerated. Orbits are unremarkable. Other: None. CT CERVICAL SPINE FINDINGS Alignment: Straightening of cervical lordosis with reversal at C5. Degenerative grade 1 C4-5 anterolisthesis. Skull base and vertebrae: No acute fracture. No primary bone lesion or focal pathologic process. Soft tissues and spinal canal: No prevertebral fluid or swelling. No visible canal hematoma. Disc levels: Multilevel cervical spondylosis greatest at the C5 through C7 levels where there is extensive discogenic disease and small marginal osteophytes. There is mild to moderate bony foraminal narrowing at the C6-7 level. No high-grade bony canal stenosis.  Upper chest: Negative. Other: Calcific atherosclerosis of bilateral carotid bifurcations. Right IJ central line partially visualized. Patent airway. IMPRESSION: 1. No acute intracranial abnormality is identified. 2. Mild chronic microvascular ischemic changes of the brain are stable. 3. No acute fracture or dislocation of the cervical spine. 4. Cervical spine degenerative changes greatest at C5 through C7 Electronically Signed   By: Kristine Garbe M.D.   On: 02/02/2016 15:07   Ct Chest W Contrast  Result Date: 02/02/2016 CLINICAL DATA:  60 year old female with a history of metastatic breast cancer presents status post fall at home yesterday with head injury. EXAM: CT CHEST, ABDOMEN, AND PELVIS WITH CONTRAST TECHNIQUE: Multidetector CT imaging of the chest, abdomen and pelvis was performed following the standard protocol during bolus administration of intravenous contrast. CONTRAST:  139mL ISOVUE-300 IOPAMIDOL (ISOVUE-300) INJECTION 61% COMPARISON:  10/26/2015 PET-CT. 04/20/2015 chest CT. 04/19/2015 CT abdomen/ pelvis. FINDINGS: CT CHEST FINDINGS Motion degraded scan. Cardiovascular: Stable mild cardiomegaly. No significant pericardial fluid/thickening. Right internal jugular MediPort terminates at the cavoatrial junction. Atherosclerotic nonaneurysmal thoracic aorta. Normal caliber pulmonary arteries. No evidence of acute thoracic aortic injury. No central pulmonary emboli. Mediastinum/Nodes: No pneumomediastinum. No mediastinal hematoma. Simple fluid lateral to the ascending thoracic aorta, unchanged back to 04/20/2015, favored represent a pericardial cyst or fluid in a pericardial recess. Right hemithyroidectomy. No discrete left thyroid lobe nodules. Unremarkable esophagus. No axillary, mediastinal or hilar lymphadenopathy. Lungs/Pleura: No pneumothorax. Trace bilateral layering pleural effusions. Mild compressive atelectasis in the dependent lower lobes. No acute consolidative airspace disease,  lung masses or significant pulmonary nodules. Musculoskeletal: No aggressive appearing focal osseous lesions. Acute minimally displaced lateral right eighth rib fracture. No additional fracture in the chest. Moderate thoracic spondylosis. Partially visualized intact appearing right breast prosthesis. CT ABDOMEN PELVIS FINDINGS Hepatobiliary: There are at least 10 scattered hypodense lesions throughout the liver, which appear increased in size and number since 10/26/2015. A 6.4 x 3.9 cm liver mass in the medial segment left liver lobe (series 9/ image 42) is increased from 5.3 x 2.7 cm. A 4.1 x 2.2 cm liver dome mass (series 9/ image 32), is increased from 2.3 x 1.7 cm. No liver laceration. Cholecystectomy .Bile ducts are stable and within expected post cholecystectomy limits with common bile duct diameter 9 mm. No radiopaque choledocholithiasis. Pancreas: No pancreatic mass, duct dilation, laceration or peripancreatic fluid collections. Spleen: Normal size. No laceration or mass. Adrenals/Urinary Tract: Normal adrenals. Nonobstructing 5 mm lower left renal stone. No hydronephrosis. No renal laceration. No renal mass. Normal bladder. 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 Normal caliber small bowel with no small bowel wall thickening. Stable appearance of the appendix with no appendiceal wall thickening or significant periappendiceal fat stranding. Normal large bowel with no diverticulosis, large bowel wall thickening or pericolonic fat stranding. Vascular/Lymphatic: Normal caliber abdominal aorta. Patent portal, splenic and renal veins. No pathologically enlarged lymph nodes in the abdomen or pelvis. Reproductive:  Grossly normal uterus.  No adnexal mass. Other: No pneumoperitoneum. Trace simple free fluid in the lower peritoneal cavity anteriorly. Nonspecific caking in the right omentum  appears new. Small midline supraumbilical ventral abdominal wall hernia contains a small portion of the sigmoid colon, decreased in size. No associated colonic wall thickening or pneumatosis. Musculoskeletal: No aggressive appearing focal osseous lesions. No fracture in the abdomen or pelvis. Severe lumbar spondylosis, not appreciably changed back to 04/19/2015. IMPRESSION: 1. Minimally displaced acute lateral right eighth rib fracture. No pneumothorax . 2. Otherwise no acute traumatic injuries in the chest, abdomen or pelvis. 3. Liver metastases have increased in size and number since 10/26/2015 PET-CT study . 4. New nonspecific caking in the right omentum, worrisome for peritoneal carcinomatosis. New simple fluid density small volume ascites in the anterior lower abdomen. 5. Additional findings include stable mild cardiomegaly, aortic atherosclerosis, trace layering bilateral pleural effusions and nonobstructing left renal stone. Electronically Signed   By: Ilona Sorrel M.D.   On: 02/02/2016 15:29   Ct Cervical Spine Wo Contrast  Result Date: 02/02/2016 CLINICAL DATA:  60 y/o F; status post fall yesterday with head injury to the right parietal area with pain in the right occipital and right-sided head. Posterior neck pain. EXAM: CT HEAD WITHOUT CONTRAST CT CERVICAL SPINE WITHOUT CONTRAST TECHNIQUE: Multidetector CT imaging of the head and cervical spine was performed following the standard protocol without intravenous contrast. Multiplanar CT image reconstructions of the cervical spine were also generated. COMPARISON:  04/19/2015 MRI of the brain. FINDINGS: CT HEAD FINDINGS Brain: No evidence of acute infarction, hemorrhage, hydrocephalus, extra-axial collection or mass lesion/mass effect. Mild chronic microvascular ischemic changes. Vascular: No hyperdense vessel. Mild calcific atherosclerosis of carotid siphons. Skull: Choose Sinuses/Orbits: Mild maxillary sinus mucosal thickening. Otherwise visualized  paranasal sinuses and mastoid air cells are normally aerated. Orbits are unremarkable. Other: None. CT CERVICAL SPINE FINDINGS Alignment: Straightening of cervical lordosis with reversal at C5. Degenerative grade 1 C4-5 anterolisthesis. Skull base and vertebrae: No acute fracture. No primary bone lesion or focal pathologic process. Soft tissues and spinal canal: No prevertebral fluid or swelling. No visible canal hematoma. Disc levels: Multilevel cervical spondylosis greatest at the C5 through C7 levels where there is extensive discogenic disease and small marginal osteophytes. There is mild to moderate bony foraminal narrowing at the C6-7 level. No high-grade bony canal stenosis. Upper chest: Negative. Other: Calcific atherosclerosis of bilateral carotid bifurcations. Right IJ central line partially visualized. Patent airway. IMPRESSION: 1. No acute intracranial abnormality is identified. 2. Mild chronic microvascular ischemic changes of the brain are stable. 3. No acute fracture or dislocation of the cervical spine. 4. Cervical spine degenerative changes greatest at C5 through C7 Electronically Signed   By: Kristine Garbe M.D.   On: 02/02/2016 15:07   Ct Abdomen Pelvis W Contrast  Result Date: 02/02/2016 CLINICAL DATA:  60 year old female with a history of metastatic breast cancer presents status post fall at home yesterday with head injury. EXAM: CT CHEST, ABDOMEN, AND PELVIS WITH CONTRAST TECHNIQUE: Multidetector CT imaging of the chest, abdomen and pelvis was performed following the standard protocol during bolus administration  of intravenous contrast. CONTRAST:  153mL ISOVUE-300 IOPAMIDOL (ISOVUE-300) INJECTION 61% COMPARISON:  10/26/2015 PET-CT. 04/20/2015 chest CT. 04/19/2015 CT abdomen/ pelvis. FINDINGS: CT CHEST FINDINGS Motion degraded scan. Cardiovascular: Stable mild cardiomegaly. No significant pericardial fluid/thickening. Right internal jugular MediPort terminates at the cavoatrial  junction. Atherosclerotic nonaneurysmal thoracic aorta. Normal caliber pulmonary arteries. No evidence of acute thoracic aortic injury. No central pulmonary emboli. Mediastinum/Nodes: No pneumomediastinum. No mediastinal hematoma. Simple fluid lateral to the ascending thoracic aorta, unchanged back to 04/20/2015, favored represent a pericardial cyst or fluid in a pericardial recess. Right hemithyroidectomy. No discrete left thyroid lobe nodules. Unremarkable esophagus. No axillary, mediastinal or hilar lymphadenopathy. Lungs/Pleura: No pneumothorax. Trace bilateral layering pleural effusions. Mild compressive atelectasis in the dependent lower lobes. No acute consolidative airspace disease, lung masses or significant pulmonary nodules. Musculoskeletal: No aggressive appearing focal osseous lesions. Acute minimally displaced lateral right eighth rib fracture. No additional fracture in the chest. Moderate thoracic spondylosis. Partially visualized intact appearing right breast prosthesis. CT ABDOMEN PELVIS FINDINGS Hepatobiliary: There are at least 10 scattered hypodense lesions throughout the liver, which appear increased in size and number since 10/26/2015. A 6.4 x 3.9 cm liver mass in the medial segment left liver lobe (series 9/ image 42) is increased from 5.3 x 2.7 cm. A 4.1 x 2.2 cm liver dome mass (series 9/ image 32), is increased from 2.3 x 1.7 cm. No liver laceration. Cholecystectomy .Bile ducts are stable and within expected post cholecystectomy limits with common bile duct diameter 9 mm. No radiopaque choledocholithiasis. Pancreas: No pancreatic mass, duct dilation, laceration or peripancreatic fluid collections. Spleen: Normal size. No laceration or mass. Adrenals/Urinary Tract: Normal adrenals. Nonobstructing 5 mm lower left renal stone. No hydronephrosis. No renal laceration. No renal mass. Normal bladder. 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 Normal caliber small bowel with no small bowel wall thickening. Stable appearance of the appendix with no appendiceal wall thickening or significant periappendiceal fat stranding. Normal large bowel with no diverticulosis, large bowel wall thickening or pericolonic fat stranding. Vascular/Lymphatic: Normal caliber abdominal aorta. Patent portal, splenic and renal veins. No pathologically enlarged lymph nodes in the abdomen or pelvis. Reproductive:  Grossly normal uterus.  No adnexal mass. Other: No pneumoperitoneum. Trace simple free fluid in the lower peritoneal cavity anteriorly. Nonspecific caking in the right omentum appears new. Small midline supraumbilical ventral abdominal wall hernia contains a small portion of the sigmoid colon, decreased in size. No associated colonic wall thickening or pneumatosis. Musculoskeletal: No aggressive appearing focal osseous lesions. No fracture in the abdomen or pelvis. Severe lumbar spondylosis, not appreciably changed back to 04/19/2015. IMPRESSION: 1. Minimally displaced acute lateral right eighth rib fracture. No pneumothorax . 2. Otherwise no acute traumatic injuries in the chest, abdomen or pelvis. 3. Liver metastases have increased in size and number since 10/26/2015 PET-CT study . 4. New nonspecific caking in the right omentum, worrisome for peritoneal carcinomatosis. New simple fluid density small volume ascites in the anterior lower abdomen. 5. Additional findings include stable mild cardiomegaly, aortic atherosclerosis, trace layering bilateral pleural effusions and nonobstructing left renal stone. Electronically Signed   By: Ilona Sorrel M.D.   On: 02/02/2016 15:29    Scheduled Meds: . ampicillin  500 mg Oral QID  . fentaNYL  100 mcg Transdermal Q48H  . fentaNYL  25 mcg Transdermal Q48H  . FLUoxetine  40 mg Oral QAC breakfast  . levothyroxine  150 mcg Oral QAC  breakfast  .  lidocaine-prilocaine   Topical Once  . lipase/protease/amylase  24,000 Units Oral TID AC  . multivitamin with minerals  1 tablet Oral Daily  . nystatin   Topical BID  . pantoprazole  40 mg Oral BID  . pregabalin  200 mg Oral BID  . temazepam  30 mg Oral QHS  . tiZANidine  4 mg Oral BID    Continuous Infusions:    Time spent: 99mins  Willys Salvino MD, PhD  [facility] Pager [phone]. If 7PM-7AM, please contact night-coverage at [email or web address], password [facility] 02/03/2016, 8:08 AM  LOS: 0 days

## 2016-02-03 NOTE — Care Management Note (Signed)
Case Management Note  Patient Details  Name: Sherilee Smotherman MRN: [id number] Date of Birth: July 28, 1955  Subjective/Objective:      60 yo admitted with Rib fracture. Hx metastatic breast cancer.              Action/Plan: Pt from home with daughter. PT evaluation done and SNF was recommended. This CM met with pt at bedside who states she plans to go back home with her daughter. Pt states she has walker, 3in1, cane and wheelchair at home. Pt states she has used [facility] for home health services previously and would like to use them again. [facility] rep called for referral and will need [facility] orders prior to discharge.  CM will continue to follow.  Expected Discharge Date:   (unknown)               Expected Discharge Plan:  [facility]  In-House Referral:     Discharge planning Services  CM Consult  Post Acute Care Choice:    Choice offered to:     DME Arranged:    DME Agency:     HH Arranged:  RN, PT, Social Work [employer] Agency:  [facility]  Status of Service:  In process, will continue to follow  If discussed at Long Length of Stay Meetings, dates discussed:    Additional CommentsLynnell Catalan, RN 02/03/2016, 3:09 PM  [phone]

## 2016-02-03 NOTE — Progress Notes (Signed)
Occupational Therapy Evaluation Patient Details Name: Kerri Vasquez MRN: [id number] DOB: [birth year] Today's Date: 02/03/2016    History of Present Illness 60yo F adm With metastatic breast cancer ,currently off therapy, reported progressive weakness, has been in bed for the last three weeks, she fell out of bed yesterday and hit her abdomen and ribs, she presented to the ED today due to increased pain. Found to have minimally displaced R 8th rib fx   Clinical Impression   Patient presents to OT with decreased ADL independence and safety due to the deficits listed below. She will benefit from skilled OT to maximize function and to facilitate a safe discharge. OT will follow.    Follow Up Recommendations  Supervision/Assistance - 24 hour;Home health OT;Other (comment) [facility] aide)    [facility] bed;Other (comment) (hoyer lift)    Recommendations for Other Services       Precautions / Restrictions Precautions Precautions: Fall Precaution Comments: multiple falls, slide of and fell off Restrictions Weight Bearing Restrictions: No      Mobility Bed Mobility Overal bed mobility: Needs Assistance;+2 for physical assistance;+ 2 for safety/equipment Bed Mobility: Rolling Rolling: Total assist;Max assist;+2 for physical assistance;+2 for safety/equipment         General bed mobility comments: total A +2 to left, max A +2 to right  Transfers                 General transfer comment: unable    Balance Overall balance assessment: History of Falls                                          ADL Overall ADL's : Needs assistance/impaired Eating/Feeding: Set up;Bed level   Grooming: Wash/dry hands;Wash/dry face;Set up;Bed level   Upper Body Bathing: Total assistance   Lower Body Bathing: Total assistance   Upper Body Dressing : Total assistance   Lower Body Dressing: Total assistance     Toilet Transfer Details  (indicate cue type and reason): unable; +2 A to use bedpan Toileting- Clothing Manipulation and Hygiene: Total assistance;+2 for physical assistance;Bed level       Functional mobility during ADLs: Maximal assistance;Total assistance;+2 for physical assistance;+2 for safety/equipment       Vision     Perception     Praxis      Pertinent Vitals/Pain Pain Assessment: 0-10 Pain Score: 9  Pain Location: back      Hand Dominance Right   Extremity/Trunk Assessment Upper Extremity Assessment Upper Extremity Assessment: RUE deficits/detail;Generalized weakness RUE Deficits / Details: limited shoulder AROM to 90 degrees   Lower Extremity Assessment Lower Extremity Assessment: Defer to PT evaluation       Communication Communication Communication: No difficulties   Cognition Arousal/Alertness: Awake/alert Behavior During Therapy: WFL for tasks assessed/performed Overall Cognitive Status: Within Functional Limits for tasks assessed                     General Comments       Exercises       Shoulder Instructions      Home Living Family/patient expects to be discharged to:: Private residence Living Arrangements: Children Available Help at Discharge: Family Type of Home: House Home Access: Stairs to enter Technical brewer of Steps: 4   Home Layout: One level     Bathroom Shower/Tub: Gaffer  Home Equipment: Bedside commode;Walker - 2 wheels;Cane - single point;Wheelchair - manual          Prior Functioning/Environment Level of Independence: Needs assistance  Gait / Transfers Assistance Needed: BSC transfers with family until lately, bed bound.     Comments: daughter performs bed bath/getting dressed/toileting. Patient able to feed self in bed.        OT Problem List: Decreased strength;Decreased activity tolerance;Impaired balance (sitting and/or standing);Decreased safety awareness;Decreased knowledge of use of DME or  AE;Pain;Obesity   OT Treatment/Interventions: Self-care/ADL training;Therapeutic exercise;DME and/or AE instruction;Therapeutic activities;Patient/family education    OT Goals(Current goals can be found in the care plan section) Acute Rehab OT Goals Patient Stated Goal: get stronger OT Goal Formulation: With patient Time For Goal Achievement: 02/17/16 Potential to Achieve Goals: Fair ADL Goals Pt Will Perform Upper Body Bathing: with min assist;bed level Pt Will Perform Upper Body Dressing: with min assist;bed level Additional ADL Goal #1: Patient will roll L/R with mod A to assist with self-care.  OT Frequency: Min 2X/week   Barriers to D/C:            Co-evaluation PT/OT/SLP Co-Evaluation/Treatment: Yes Reason for Co-Treatment: For patient/therapist safety;Complexity of the patient's impairments (multi-system involvement) PT goals addressed during session: Mobility/safety with mobility OT goals addressed during session: ADL's and self-care      End of Session Nurse Communication: Mobility status  Activity Tolerance: Patient tolerated treatment well Patient left: in bed;with call bell/phone within reach;with bed alarm set   Time: [id number] OT Time Calculation (min): 20 min Charges:  OT General Charges $OT Visit: 1 Procedure OT Evaluation $OT Eval Moderate Complexity: 1 Procedure G-Codes: OT G-codes **NOT FOR INPATIENT CLASS** Functional Assessment Tool Used: clinical judgment Functional Limitation: Self care Self Care Current Status [id number]): At least 80 percent but less than 100 percent impaired, limited or restricted Self Care Goal Status [id number]): At least 40 percent but less than 60 percent impaired, limited or restricted  Hayato Guaman A 02/03/2016, 11:43 AM

## 2016-02-03 NOTE — Care Management Obs Status (Signed)
[place] NOTIFICATION   Patient Details  Name: Kerri Vasquez MRN: [id number] Date of Birth: Nov 24, 1955   Medicare Observation Status Notification Given:  Yes    Lynnell Catalan, RN 02/03/2016, 3:06 PM

## 2016-02-04 DIAGNOSIS — S2231XA Fracture of one rib, right side, initial encounter for closed fracture: Secondary | ICD-10-CM | POA: Diagnosis not present

## 2016-02-04 DIAGNOSIS — B379 Candidiasis, unspecified: Secondary | ICD-10-CM | POA: Diagnosis not present

## 2016-02-04 DIAGNOSIS — C786 Secondary malignant neoplasm of retroperitoneum and peritoneum: Secondary | ICD-10-CM | POA: Diagnosis present

## 2016-02-04 DIAGNOSIS — R531 Weakness: Secondary | ICD-10-CM | POA: Diagnosis not present

## 2016-02-04 DIAGNOSIS — F329 Major depressive disorder, single episode, unspecified: Secondary | ICD-10-CM | POA: Diagnosis present

## 2016-02-04 DIAGNOSIS — I959 Hypotension, unspecified: Secondary | ICD-10-CM | POA: Diagnosis not present

## 2016-02-04 DIAGNOSIS — M797 Fibromyalgia: Secondary | ICD-10-CM | POA: Diagnosis present

## 2016-02-04 DIAGNOSIS — Z85850 Personal history of malignant neoplasm of thyroid: Secondary | ICD-10-CM | POA: Diagnosis not present

## 2016-02-04 DIAGNOSIS — M47812 Spondylosis without myelopathy or radiculopathy, cervical region: Secondary | ICD-10-CM | POA: Diagnosis not present

## 2016-02-04 DIAGNOSIS — I110 Hypertensive heart disease with heart failure: Secondary | ICD-10-CM | POA: Diagnosis present

## 2016-02-04 DIAGNOSIS — E039 Hypothyroidism, unspecified: Secondary | ICD-10-CM | POA: Diagnosis present

## 2016-02-04 DIAGNOSIS — S8992XA Unspecified injury of left lower leg, initial encounter: Secondary | ICD-10-CM | POA: Diagnosis not present

## 2016-02-04 DIAGNOSIS — I252 Old myocardial infarction: Secondary | ICD-10-CM | POA: Diagnosis not present

## 2016-02-04 DIAGNOSIS — Z79899 Other long term (current) drug therapy: Secondary | ICD-10-CM | POA: Diagnosis not present

## 2016-02-04 DIAGNOSIS — L89152 Pressure ulcer of sacral region, stage 2: Secondary | ICD-10-CM | POA: Diagnosis not present

## 2016-02-04 DIAGNOSIS — Z7401 Bed confinement status: Secondary | ICD-10-CM | POA: Diagnosis not present

## 2016-02-04 DIAGNOSIS — Z6841 Body Mass Index (BMI) 40.0 and over, adult: Secondary | ICD-10-CM | POA: Diagnosis not present

## 2016-02-04 DIAGNOSIS — E119 Type 2 diabetes mellitus without complications: Secondary | ICD-10-CM | POA: Diagnosis present

## 2016-02-04 DIAGNOSIS — D696 Thrombocytopenia, unspecified: Secondary | ICD-10-CM | POA: Diagnosis not present

## 2016-02-04 DIAGNOSIS — W19XXXD Unspecified fall, subsequent encounter: Secondary | ICD-10-CM | POA: Diagnosis not present

## 2016-02-04 DIAGNOSIS — W06XXXA Fall from bed, initial encounter: Secondary | ICD-10-CM | POA: Diagnosis present

## 2016-02-04 DIAGNOSIS — Y92009 Unspecified place in unspecified non-institutional (private) residence as the place of occurrence of the external cause: Secondary | ICD-10-CM | POA: Diagnosis not present

## 2016-02-04 DIAGNOSIS — Z9884 Bariatric surgery status: Secondary | ICD-10-CM | POA: Diagnosis not present

## 2016-02-04 DIAGNOSIS — W19XXXA Unspecified fall, initial encounter: Secondary | ICD-10-CM | POA: Diagnosis not present

## 2016-02-04 DIAGNOSIS — M25462 Effusion, left knee: Secondary | ICD-10-CM | POA: Diagnosis not present

## 2016-02-04 DIAGNOSIS — Z853 Personal history of malignant neoplasm of breast: Secondary | ICD-10-CM | POA: Diagnosis not present

## 2016-02-04 DIAGNOSIS — S2231XD Fracture of one rib, right side, subsequent encounter for fracture with routine healing: Secondary | ICD-10-CM | POA: Diagnosis not present

## 2016-02-04 DIAGNOSIS — C787 Secondary malignant neoplasm of liver and intrahepatic bile duct: Secondary | ICD-10-CM | POA: Diagnosis present

## 2016-02-04 DIAGNOSIS — I5032 Chronic diastolic (congestive) heart failure: Secondary | ICD-10-CM | POA: Diagnosis present

## 2016-02-04 DIAGNOSIS — S2239XA Fracture of one rib, unspecified side, initial encounter for closed fracture: Secondary | ICD-10-CM | POA: Diagnosis not present

## 2016-02-04 DIAGNOSIS — E876 Hypokalemia: Secondary | ICD-10-CM | POA: Diagnosis not present

## 2016-02-04 DIAGNOSIS — E2740 Unspecified adrenocortical insufficiency: Secondary | ICD-10-CM | POA: Diagnosis present

## 2016-02-04 DIAGNOSIS — Z7952 Long term (current) use of systemic steroids: Secondary | ICD-10-CM | POA: Diagnosis not present

## 2016-02-04 DIAGNOSIS — R930 Abnormal findings on diagnostic imaging of skull and head, not elsewhere classified: Secondary | ICD-10-CM | POA: Diagnosis not present

## 2016-02-04 DIAGNOSIS — C801 Malignant (primary) neoplasm, unspecified: Secondary | ICD-10-CM | POA: Diagnosis not present

## 2016-02-04 DIAGNOSIS — K219 Gastro-esophageal reflux disease without esophagitis: Secondary | ICD-10-CM | POA: Diagnosis present

## 2016-02-04 DIAGNOSIS — R627 Adult failure to thrive: Secondary | ICD-10-CM | POA: Diagnosis not present

## 2016-02-04 DIAGNOSIS — C50919 Malignant neoplasm of unspecified site of unspecified female breast: Secondary | ICD-10-CM | POA: Diagnosis not present

## 2016-02-04 DIAGNOSIS — S8991XA Unspecified injury of right lower leg, initial encounter: Secondary | ICD-10-CM | POA: Diagnosis not present

## 2016-02-04 LAB — CBC WITH DIFFERENTIAL/PLATELET
BASOS PCT: 0 %
Basophils Absolute: 0 10*3/uL (ref 0.0–0.1)
EOS ABS: 0.1 10*3/uL (ref 0.0–0.7)
Eosinophils Relative: 2 %
HCT: 30.1 % — ABNORMAL LOW (ref 36.0–46.0)
HEMOGLOBIN: 9.7 g/dL — AB (ref 12.0–15.0)
Lymphocytes Relative: 17 %
Lymphs Abs: 0.9 10*3/uL (ref 0.7–4.0)
MCH: 34.5 pg — ABNORMAL HIGH (ref 26.0–34.0)
MCHC: 32.2 g/dL (ref 30.0–36.0)
MCV: 107.1 fL — ABNORMAL HIGH (ref 78.0–100.0)
Monocytes Absolute: 0.1 10*3/uL (ref 0.1–1.0)
Monocytes Relative: 3 %
NEUTROS ABS: 4 10*3/uL (ref 1.7–7.7)
NEUTROS PCT: 78 %
Platelets: 64 10*3/uL — ABNORMAL LOW (ref 150–400)
RBC: 2.81 MIL/uL — AB (ref 3.87–5.11)
RDW: 19 % — ABNORMAL HIGH (ref 11.5–15.5)
WBC: 5.1 10*3/uL (ref 4.0–10.5)

## 2016-02-04 LAB — IRON AND TIBC
Iron: 37 ug/dL (ref 28–170)
Saturation Ratios: 16 % (ref 10.4–31.8)
TIBC: 227 ug/dL — AB (ref 250–450)
UIBC: 190 ug/dL

## 2016-02-04 LAB — CK: CK TOTAL: 37 U/L — AB (ref 38–234)

## 2016-02-04 LAB — BASIC METABOLIC PANEL
ANION GAP: 6 (ref 5–15)
BUN: 29 mg/dL — ABNORMAL HIGH (ref 6–20)
CALCIUM: 8.1 mg/dL — AB (ref 8.9–10.3)
CO2: 25 mmol/L (ref 22–32)
Chloride: 111 mmol/L (ref 101–111)
Creatinine, Ser: 0.9 mg/dL (ref 0.44–1.00)
Glucose, Bld: 88 mg/dL (ref 65–99)
Potassium: 4 mmol/L (ref 3.5–5.1)
SODIUM: 142 mmol/L (ref 135–145)

## 2016-02-04 LAB — CORTISOL: CORTISOL PLASMA: 3.7 ug/dL

## 2016-02-04 LAB — SAVE SMEAR

## 2016-02-04 LAB — VITAMIN B12: VITAMIN B 12: 315 pg/mL (ref 180–914)

## 2016-02-04 LAB — TSH: TSH: 63.069 u[IU]/mL — AB (ref 0.350–4.500)

## 2016-02-04 LAB — LACTIC ACID, PLASMA: LACTIC ACID, VENOUS: 0.8 mmol/L (ref 0.5–1.9)

## 2016-02-04 LAB — PROTIME-INR
INR: 1.02
PROTHROMBIN TIME: 13.4 s (ref 11.4–15.2)

## 2016-02-04 LAB — FERRITIN: Ferritin: 808 ng/mL — ABNORMAL HIGH (ref 11–307)

## 2016-02-04 LAB — PREALBUMIN: Prealbumin: 27.3 mg/dL (ref 18–38)

## 2016-02-04 MED ORDER — FENTANYL 75 MCG/HR TD PT72
75.0000 ug | MEDICATED_PATCH | TRANSDERMAL | Status: DC
  Administered 2016-02-04 – 2016-02-06 (×2): 75 ug via TRANSDERMAL
  Filled 2016-02-04 (×2): qty 1

## 2016-02-04 MED ORDER — SENNOSIDES-DOCUSATE SODIUM 8.6-50 MG PO TABS
1.0000 | ORAL_TABLET | Freq: Two times a day (BID) | ORAL | Status: DC
  Administered 2016-02-04 (×2): 1 via ORAL
  Filled 2016-02-04 (×2): qty 1

## 2016-02-04 MED ORDER — COSYNTROPIN 0.25 MG IJ SOLR
0.2500 mg | Freq: Once | INTRAMUSCULAR | Status: AC
  Administered 2016-02-05: 0.25 mg via INTRAVENOUS
  Filled 2016-02-04 (×2): qty 0.25

## 2016-02-04 NOTE — Progress Notes (Signed)
This CM spoke with daughter via phone. Daughter requesting hoyar lift and hospital bed for home. MD asked for orders, along with [place] orders. [place] DME rep contacted for equipment delivery. Pt may need ambulance transport home per daughter. CM staff will be available over the weekend to help with ambulance transport. Marney Doctor RN,BSN,NCM [phone]

## 2016-02-04 NOTE — Progress Notes (Signed)
[name] looks a little better.  She still is having leg weakness.  MRI does NOT show any met disease to the spine. She has horrible spinal stenosis and deterioration.  I am not sure why neurosurgery doesn't think that this is a source for her weakness.  I do think that a myelogram might be helpful.  Her platelet count is too low for a myelogram right now.  The platelet count is coming up a little bit.  She is eating a little better.  Looks like there is a little more strength in her legs.  She's not yet had the brain MRI. I would have to make sure that there is no obvious leptomeningeal disease.  Her cultures are still pending. On her physical exam, her vital signs look pretty stable. Her blood pressure is a little long the lower side at 94/74. Her lungs sound clear. Cardiac exam regular rate and rhythm with no murmurs, rubs or bruits. Abdomen is obese but soft. There is no guarding or rebound tenderness. Is no obvious hepatomegaly. Extremities shows a little better movement and strength in her feet. She may have a little bit of better movement in her legs overall. Neurological exam shows no obvious focal neurological deficits. She has had the leg weakness.  I do that she has metastatic breast cancer. I still am not convinced that this is related to her leg weakness. I would not think that this would be paraneoplastic. I suppose this could happen but it will be hard to prove.  I think that may be getting physical therapy and for her would not be a bad idea right now. Maybe they can start working with her to at least improve range of motion of her legs.  It is obvious that nutrition will be helpful. I will check a prealbumin on her.  We will continue to follow along.  Lattie Haw, MD  Hebrew[place] 12:12

## 2016-02-04 NOTE — Progress Notes (Signed)
PROGRESS NOTE  Kerri Vasquez [id number] DOB: July 08, 1955 DOA: 02/02/2016 PCP: [name], MD  HPI/Recap of past 24 hours:  bp still borderline low but slightly better after the fluids started, she seems a little bit more awake today, still slow in answering questions,   Report no bm for several days  Assessment/Plan: Active Problems:   Rib fracture   Fall   Pressure injury of skin   Fall with progressive weakness, now bed bound Has been bed bound for the last three weeks,  Ct cervical spine/mri thoracic/lumber spine no mets, fall precaution, PT/OT Unclear etiology for the progressive weakness, patient notwhave been bedbound, not able to sit up,  Ck low  paraneoplastic syndrome? She does has elevated total protein with low albumin,  Though oncology think this is less likely in breast cancer mri brain to r/o leptomingeal disease.  am cortisol level low, will get cosyntropin stim test.  tsh very elevated,  Adrenal insufficiency and hypothyroidism are certainly cause generalized weakness    Thrombocytopenia:  acute, unclear etiology. She report has been off chemo for three weeks, currently on sign of infection, no acute bleed, CT ab does show numerous liver mets that is increasing in size, spleen is normal in size. enous [place] lower extremity no DVT, b12/iron unremarkable She has been taking amoxicillin, side effect from abx? Will d/c amoxicillin, hem/oncology consulted. Dr Marin Olp will get blood smear, may need bone marrow, to be determined by Dr Marin Olp.  hypotension,  though patient does not look septic, d/c coreg,  Low bp from over sedation?, patient look drowsy, did not require additional prn pain meds, will taper fentanyl patch,   Start gentle hydration due to poor oral intake and hypotension ua and blood culture, check lactic acid, procalcitonin,  Am cortisol low, getting cosyntropin stim test,    Sacral decubitus ulcer presented on admission :    skin care, she report pus has resolved since she is started on amoxicillin, amoxicillin d/ced due to unexplained thrombocytopenia Wound care input appreciated "Reason for Consult:Deep tissue pressure injury (DTPI) at the coccyx in the intragluteal skin fold, partial thickness tissue loss in the periwound area, at risk heels for pressure injury, moisture management issues in the sub-pannicular and inguinal skin folds resulting in intertriginous dermatitis Wound type:Pressure moisture Pressure Ulcer POA: Yes Measurement: Coccyx wound measures 3cm x 10cm with deeper center measuring 2cm x 1cm x 0.5cm at this time. Left buttocks with partial thickness tissue loss measures 5cm x 4cm x 0.2cm. Wound bed: central area with depth is purple/red, surrounding areas of partial thickness tissue loss are erythematous with red, moist base. Bilateral inguinal area and sub-pannicular areas are moist and weeping serous fluid. Drainage (amount, consistency, odor) moderate serous and serosanguinous Periwound: erythematous, partial thickness tissue loss from previous tape use. Dressing procedure/placement/frequency: While patient is on a therapeutic mattress with low air loss feature, she requires a bariatric bed and we will arrange for that today.  Bilateral Pressure redistribution heel boots will be provided as well.  Her heels are intact and blance, but with decreased mobility and a preference for the supine position, they are at risk. Additionally, the skin fold moisture issues will be addressed with our antimicrobial textile, InterDry Ag+. Most concerning is the deep tissue pressure injury with periwound partial thickness tissue loss; I have implemented daily topical care with an antimicrobial (silver) dressing topped with a silicone foam for the sacral and coccyx areas.  Patient will have to turn side to side  and minimize time spent in the supine position.  Discussed POC with bedside RN. [place] nursing team will not  follow routinely, but will remain available to this patient, the nursing and medical teams.  Please re-consult if ulcer deteriorates or if new pressure related areas present or if intertriginous areas of dermatitis do not respond to interventions. Thanks, Maudie Flakes, MSN, RN, [place], Arther Abbott  Pager# [phone]"   Bilateral groin candidiasis, topical nytstin   Drowsiness: from oversedation? CT head no acute findings,  Mri brain pending  H/o diastolic chf,   she state she stopped taking lasix recently, her edema has much improved compare to before bp to low for coreg, on gentle hydration due to hypotension, close monitor volume status   H/o pernicious anemia: getting b12 shots, hgb seems to be stable at baseline.  Metastatic breast cancer, has progressed , with liver mets and possble mets to omentum. Now with progressive weakness and ECOG score has declined,  oncology Dr Martha Clan following, input appreciated  DVT prophylaxis: scd due to thrombocytopenia  Consultants: hem/oncology  Code Status: full , confirmed with patient  Family Communication:  Patient   Disposition Plan: pending, though PT recommend SNF, patient wants to go home, will arrange home health    Consultants:  Oncology Dr Marin Olp  Neurosurgery Dr Vertell Limber phone conversation regarding MRi spine findings  Procedures:  none  Antibiotics:  Amoxicillin ( continued from home meds), discontinued on 10/5   Objective: BP 94/74 (BP Location: Left Arm)   Pulse 71   Temp 98.1 F (36.7 C) (Oral)   Resp 14   Ht 5\' 6"  (1.676 m)   Wt 128.8 kg (283 lb 15.2 oz)   LMP 08/30/2006   SpO2 97%   BMI 45.83 kg/m   Intake/Output Summary (Last 24 hours) at 02/04/16 0746 Last data filed at 02/04/16 0645  Gross per 24 hour  Intake             1060 ml  Output                0 ml  Net             1060 ml   Filed Weights   02/02/16 1902  Weight: 128.8 kg (283 lb 15.2 oz)     Exam:   General:  Chronically ill, obese, drowsy  Eyes: PERRL  ENT: unremarkable  Neck: supple, no JVD  Cardiovascular: RRR  Respiratory: diminished at basis, poor respiratory effort, no wheezing, no rales, no rhonchi  Abdomen: soft/ND/ND, positive bowel sounds  Skin: bilateral groin candidiasis changes, sacral decubitus ulcer presented on admission  Musculoskeletal:  No edema  Psychiatric: calm/cooperative  Neurologic: bilateral lower extremity not able to lift against gravity    Data Reviewed: Basic Metabolic Panel:  Recent Labs Lab 02/02/16 1321 02/02/16 1341 02/03/16 0450 02/04/16 0600  NA 138 140 141 142  K 4.4 4.4 4.2 4.0  CL 108 107 111 111  CO2 25  --  27 25  GLUCOSE 121* 117* 98 88  BUN 28* 28* 26* 29*  CREATININE 0.87 1.00 0.94 0.90  CALCIUM 8.0*  --  7.6* 8.1*   Liver Function Tests:  Recent Labs Lab 02/02/16 1321 02/03/16 0450  AST 15 15  ALT 29 24  ALKPHOS 44 38  BILITOT 1.0 0.8  PROT 5.5* 4.5*  ALBUMIN 3.2* 2.6*   No results for input(s): LIPASE, AMYLASE in the last 168 hours. No results for input(s): AMMONIA in the last 168  hours. CBC:  Recent Labs Lab 02/02/16 1321 02/02/16 1341 02/03/16 0450 02/04/16 0600  WBC 5.3  --  4.3 5.1  NEUTROABS 4.6  --   --  4.0  HGB 9.8* 9.2* 8.9* 9.7*  HCT 29.2* 27.0* 26.8* 30.1*  MCV 100.3*  --  105.1* 107.1*  PLT 68*  --  59* 64*   Cardiac Enzymes:    Recent Labs Lab 02/04/16 0600  CKTOTAL 37*   BNP (last 3 results) No results for input(s): BNP in the last 8760 hours.  ProBNP (last 3 results) No results for input(s): PROBNP in the last 8760 hours.  CBG: No results for input(s): GLUCAP in the last 168 hours.  Recent Results (from the past 240 hour(s))  MRSA PCR Screening     Status: Abnormal   Collection Time: 02/03/16 10:26 AM  Result Value Ref Range Status   MRSA by PCR POSITIVE (A) NEGATIVE Final    Comment:        The GeneXpert MRSA Assay (FDA approved for NASAL  specimens only), is one component of a comprehensive MRSA colonization surveillance program. It is not intended to diagnose MRSA infection nor to guide or monitor treatment for MRSA infections. RESULT CALLED TO, READ BACK BY AND VERIFIED WITH: HUGHEY,C @ 1620 ON 100517 BY POTEAT,S      Studies: Mr Thoracic Spine W Wo Contrast  Result Date: 02/03/2016 CLINICAL DATA:  Metastatic breast cancer, status post fall yesterday. Progressive weakness, unable to ambulate. EXAM: MRI THORACIC AND LUMBAR SPINE WITHOUT AND WITH CONTRAST TECHNIQUE: Multiplanar and multiecho pulse sequences of the thoracic and lumbar spine were obtained without and with intravenous contrast. CONTRAST:  3mL MULTIHANCE GADOBENATE DIMEGLUMINE 529 MG/ML IV SOLN COMPARISON:  04/19/2015 MR lumbar spine and thoracic spine, CT chest 04/20/2015, PET-CT 10/26/2015, CT chest/ abdomen/pelvis 02/02/2016 FINDINGS: MRI THORACIC SPINE FINDINGS Alignment:  No static listhesis.  Mild kyphosis centered at T7-8. Vertebrae: No fracture, evidence of discitis, or bone lesion. Cord:  Normal signal and morphology. Paraspinal and other soft tissues: No paraspinal soft tissue abnormality. Bilateral small pleural effusions. Possible small right pericardial cyst. Disc levels: Disc spaces: Mild degenerative disc disease with minimal disc height loss at T6-7 and T7-8. On the sagittal scout images there is degenerative disc disease at C5-6 and to a lesser extent C6-7. T1-T2: Minimal broad-based disc bulge. No foraminal or central canal stenosis. T2-T3: No disc protrusion, foraminal stenosis or central canal stenosis. T3-T4: Small left paracentral disc protrusion. No foraminal or central canal stenosis. T4-T5: No disc protrusion, foraminal stenosis or central canal stenosis. T5-T6: No disc protrusion, foraminal stenosis or central canal stenosis. T6-T7: Small right paracentral disc protrusion contacting the thoracic spinal cord. No foraminal or central canal  stenosis. T7-T8: Small right paracentral disc protrusion. No foraminal or central canal stenosis. T8-T9: No disc protrusion, foraminal stenosis or central canal stenosis. T9-T10: No disc protrusion, foraminal stenosis or central canal stenosis. T10-T11: No disc protrusion, foraminal stenosis or central canal stenosis. T11-T12: No disc protrusion, foraminal stenosis or central canal stenosis. MRI LUMBAR SPINE FINDINGS Segmentation:  Standard. Alignment: Minimal grade 1 retrolisthesis of L1 on L2 secondary to degenerative changes. Vertebrae: No acute fracture or aggressive bone lesion. Fluid in the L2-3 disc with mild endplate enhancement, but without significant disc enhancement. No significant endplate reactive marrow edema. Endplate irregularity is noted. Small right SI joint effusion. Conus medullaris: Extends to the T12-L1 level and appears normal. Paraspinal and other soft tissues: No paraspinal soft tissue abnormality. Disc levels: Disc spaces:  Degenerative disc disease with severe disc height loss at L1-2. Degenerative disc disease with disc height loss at L3-4 and L4-5. T12-L1: Mild broad-based disc bulge. No evidence of neural foraminal stenosis. No central canal stenosis. L1-L2: No significant disc protrusion. Mild bilateral facet arthropathy. No evidence of neural foraminal stenosis. No central canal stenosis. L2-L3: Broad disc bulge. Moderate-severe bilateral facet arthropathy with moderate central canal stenosis and bilateral lateral recess stenosis. Mild right and moderate left foraminal stenosis. L3-L4: Mild broad-based disc bulge. Severe bilateral facet arthropathy. Mild spinal stenosis and bilateral lateral recess stenosis. Severe right foraminal stenosis. No left foraminal stenosis. L4-L5: Mild broad-based disc bulge eccentric towards the right abutting the right extra foraminal L4 nerve root. Severe right and mild left facet arthropathy. Right lateral recess stenosis. Severe right foraminal  stenosis. L5-S1: No significant disc bulge. No evidence of neural foraminal stenosis. No central canal stenosis. Moderate bilateral facet arthropathy, right worse than left. IMPRESSION: THORACIC SPINE: 1. No acute osseous injury of the thoracic spine. 2. No osseous metastatic disease of the thoracic spine. 3. Bilateral small pleural effusions. LUMBAR SPINE: 1. No acute injury of the lumbar spine. 2. No osseous metastatic disease of the lumbar spine. 3. Diffuse lumbar spine spondylosis as described above. 4. Severe L2-3 disc edema and mild endplate enhancement with severe bilateral facet arthropathy likely relate to severe inflammatory changes given that there is no significant interval change compared with 04/19/2015 nor is there a paravertebral fluid collection or epidural fluid collection. Discitis-osteomyelitis is considered most less likely. 5. At L2-3 there is moderate-severe bilateral facet arthropathy with moderate central canal stenosis and bilateral lateral recess stenosis. Mild right and moderate left foraminal stenosis. 6. At L4-5 there is a mild broad-based disc bulge eccentric towards the right abutting the right extra foraminal L4 nerve root. Severe right and mild left facet arthropathy. Right lateral recess stenosis. Severe right foraminal stenosis. 7. At L3-4 there is a broad-based disc bulge. Severe bilateral facet arthropathy. Mild spinal stenosis and bilateral lateral recess stenosis. Severe right foraminal stenosis. Electronically Signed   By: Kathreen Devoid   On: 02/03/2016 08:36   Mr Lumbar Spine W Wo Contrast  Result Date: 02/03/2016 CLINICAL DATA:  Metastatic breast cancer, status post fall yesterday. Progressive weakness, unable to ambulate. EXAM: MRI THORACIC AND LUMBAR SPINE WITHOUT AND WITH CONTRAST TECHNIQUE: Multiplanar and multiecho pulse sequences of the thoracic and lumbar spine were obtained without and with intravenous contrast. CONTRAST:  90mL MULTIHANCE GADOBENATE DIMEGLUMINE  529 MG/ML IV SOLN COMPARISON:  04/19/2015 MR lumbar spine and thoracic spine, CT chest 04/20/2015, PET-CT 10/26/2015, CT chest/ abdomen/pelvis 02/02/2016 FINDINGS: MRI THORACIC SPINE FINDINGS Alignment:  No static listhesis.  Mild kyphosis centered at T7-8. Vertebrae: No fracture, evidence of discitis, or bone lesion. Cord:  Normal signal and morphology. Paraspinal and other soft tissues: No paraspinal soft tissue abnormality. Bilateral small pleural effusions. Possible small right pericardial cyst. Disc levels: Disc spaces: Mild degenerative disc disease with minimal disc height loss at T6-7 and T7-8. On the sagittal scout images there is degenerative disc disease at C5-6 and to a lesser extent C6-7. T1-T2: Minimal broad-based disc bulge. No foraminal or central canal stenosis. T2-T3: No disc protrusion, foraminal stenosis or central canal stenosis. T3-T4: Small left paracentral disc protrusion. No foraminal or central canal stenosis. T4-T5: No disc protrusion, foraminal stenosis or central canal stenosis. T5-T6: No disc protrusion, foraminal stenosis or central canal stenosis. T6-T7: Small right paracentral disc protrusion contacting the thoracic spinal cord. No foraminal or  central canal stenosis. T7-T8: Small right paracentral disc protrusion. No foraminal or central canal stenosis. T8-T9: No disc protrusion, foraminal stenosis or central canal stenosis. T9-T10: No disc protrusion, foraminal stenosis or central canal stenosis. T10-T11: No disc protrusion, foraminal stenosis or central canal stenosis. T11-T12: No disc protrusion, foraminal stenosis or central canal stenosis. MRI LUMBAR SPINE FINDINGS Segmentation:  Standard. Alignment: Minimal grade 1 retrolisthesis of L1 on L2 secondary to degenerative changes. Vertebrae: No acute fracture or aggressive bone lesion. Fluid in the L2-3 disc with mild endplate enhancement, but without significant disc enhancement. No significant endplate reactive marrow edema.  Endplate irregularity is noted. Small right SI joint effusion. Conus medullaris: Extends to the T12-L1 level and appears normal. Paraspinal and other soft tissues: No paraspinal soft tissue abnormality. Disc levels: Disc spaces: Degenerative disc disease with severe disc height loss at L1-2. Degenerative disc disease with disc height loss at L3-4 and L4-5. T12-L1: Mild broad-based disc bulge. No evidence of neural foraminal stenosis. No central canal stenosis. L1-L2: No significant disc protrusion. Mild bilateral facet arthropathy. No evidence of neural foraminal stenosis. No central canal stenosis. L2-L3: Broad disc bulge. Moderate-severe bilateral facet arthropathy with moderate central canal stenosis and bilateral lateral recess stenosis. Mild right and moderate left foraminal stenosis. L3-L4: Mild broad-based disc bulge. Severe bilateral facet arthropathy. Mild spinal stenosis and bilateral lateral recess stenosis. Severe right foraminal stenosis. No left foraminal stenosis. L4-L5: Mild broad-based disc bulge eccentric towards the right abutting the right extra foraminal L4 nerve root. Severe right and mild left facet arthropathy. Right lateral recess stenosis. Severe right foraminal stenosis. L5-S1: No significant disc bulge. No evidence of neural foraminal stenosis. No central canal stenosis. Moderate bilateral facet arthropathy, right worse than left. IMPRESSION: THORACIC SPINE: 1. No acute osseous injury of the thoracic spine. 2. No osseous metastatic disease of the thoracic spine. 3. Bilateral small pleural effusions. LUMBAR SPINE: 1. No acute injury of the lumbar spine. 2. No osseous metastatic disease of the lumbar spine. 3. Diffuse lumbar spine spondylosis as described above. 4. Severe L2-3 disc edema and mild endplate enhancement with severe bilateral facet arthropathy likely relate to severe inflammatory changes given that there is no significant interval change compared with 04/19/2015 nor is there a  paravertebral fluid collection or epidural fluid collection. Discitis-osteomyelitis is considered most less likely. 5. At L2-3 there is moderate-severe bilateral facet arthropathy with moderate central canal stenosis and bilateral lateral recess stenosis. Mild right and moderate left foraminal stenosis. 6. At L4-5 there is a mild broad-based disc bulge eccentric towards the right abutting the right extra foraminal L4 nerve root. Severe right and mild left facet arthropathy. Right lateral recess stenosis. Severe right foraminal stenosis. 7. At L3-4 there is a broad-based disc bulge. Severe bilateral facet arthropathy. Mild spinal stenosis and bilateral lateral recess stenosis. Severe right foraminal stenosis. Electronically Signed   By: Kathreen Devoid   On: 02/03/2016 08:36    Scheduled Meds: . Chlorhexidine Gluconate Cloth  6 each Topical Q0600  . fentaNYL  100 mcg Transdermal Q48H  . FLUoxetine  40 mg Oral QAC breakfast  . levothyroxine  150 mcg Oral QAC breakfast  . lidocaine-prilocaine   Topical Once  . lipase/protease/amylase  24,000 Units Oral TID AC  . multivitamin with minerals  1 tablet Oral Daily  . mupirocin ointment  1 application Nasal BID  . pantoprazole  40 mg Oral BID  . pregabalin  200 mg Oral BID  . protein supplement shake  11 oz Oral  TID BM  . temazepam  30 mg Oral QHS  . tiZANidine  4 mg Oral BID    Continuous Infusions: . sodium chloride 75 mL/hr at 02/03/16 1608     Time spent: 4mins  Shimshon Narula MD, PhD  [facility] Pager [phone]. If 7PM-7AM, please contact night-coverage at [email or web address], password [facility] 02/04/2016, 7:46 AM  LOS: 0 days

## 2016-02-05 DIAGNOSIS — D509 Iron deficiency anemia, unspecified: Secondary | ICD-10-CM

## 2016-02-05 DIAGNOSIS — G8929 Other chronic pain: Secondary | ICD-10-CM

## 2016-02-05 LAB — CBC
HEMATOCRIT: 28.1 % — AB (ref 36.0–46.0)
HEMOGLOBIN: 9.4 g/dL — AB (ref 12.0–15.0)
MCH: 34.2 pg — ABNORMAL HIGH (ref 26.0–34.0)
MCHC: 33.5 g/dL (ref 30.0–36.0)
MCV: 102.2 fL — ABNORMAL HIGH (ref 78.0–100.0)
Platelets: 76 10*3/uL — ABNORMAL LOW (ref 150–400)
RBC: 2.75 MIL/uL — ABNORMAL LOW (ref 3.87–5.11)
RDW: 18.5 % — AB (ref 11.5–15.5)
WBC: 5.4 10*3/uL (ref 4.0–10.5)

## 2016-02-05 LAB — BASIC METABOLIC PANEL
Anion gap: 4 — ABNORMAL LOW (ref 5–15)
BUN: 31 mg/dL — ABNORMAL HIGH (ref 6–20)
CHLORIDE: 113 mmol/L — AB (ref 101–111)
CO2: 24 mmol/L (ref 22–32)
Calcium: 8 mg/dL — ABNORMAL LOW (ref 8.9–10.3)
Creatinine, Ser: 0.96 mg/dL (ref 0.44–1.00)
Glucose, Bld: 117 mg/dL — ABNORMAL HIGH (ref 65–99)
POTASSIUM: 3.6 mmol/L (ref 3.5–5.1)
SODIUM: 141 mmol/L (ref 135–145)

## 2016-02-05 LAB — ACTH STIMULATION, 3 TIME POINTS
Cortisol, 30 Min: 20 ug/dL
Cortisol, 60 Min: 21.3 ug/dL
Cortisol, Base: 10.9 ug/dL

## 2016-02-05 LAB — PROCALCITONIN: Procalcitonin: <0.1 ng/mL

## 2016-02-05 LAB — CANCER ANTIGEN 27.29: CA 27.29: 460 U/mL — ABNORMAL HIGH (ref 0.0–38.6)

## 2016-02-05 LAB — MAGNESIUM: MAGNESIUM: 2 mg/dL (ref 1.7–2.4)

## 2016-02-05 MED ORDER — POLYETHYLENE GLYCOL 3350 17 G PO PACK
17.0000 g | PACK | Freq: Every day | ORAL | Status: DC
  Administered 2016-02-05 – 2016-02-09 (×5): 17 g via ORAL
  Filled 2016-02-05 (×5): qty 1

## 2016-02-05 MED ORDER — SODIUM CHLORIDE 0.9 % IV SOLN
510.0000 mg | Freq: Once | INTRAVENOUS | Status: AC
  Administered 2016-02-05: 510 mg via INTRAVENOUS
  Filled 2016-02-05: qty 17

## 2016-02-05 MED ORDER — CARVEDILOL 3.125 MG PO TABS
3.1250 mg | ORAL_TABLET | Freq: Two times a day (BID) | ORAL | Status: DC
  Administered 2016-02-05 – 2016-02-06 (×3): 3.125 mg via ORAL
  Filled 2016-02-05 (×3): qty 1

## 2016-02-05 MED ORDER — SENNOSIDES-DOCUSATE SODIUM 8.6-50 MG PO TABS
2.0000 | ORAL_TABLET | Freq: Two times a day (BID) | ORAL | Status: DC
  Administered 2016-02-05 – 2016-02-09 (×8): 2 via ORAL
  Filled 2016-02-05 (×8): qty 2

## 2016-02-05 MED ORDER — POTASSIUM CHLORIDE CRYS ER 20 MEQ PO TBCR
40.0000 meq | EXTENDED_RELEASE_TABLET | Freq: Once | ORAL | Status: AC
  Administered 2016-02-05: 40 meq via ORAL
  Filled 2016-02-05: qty 2

## 2016-02-05 NOTE — Progress Notes (Signed)
Ms. Edsel Petrin is doing a little better. She seems removing her legs a little bit more. She still does not have the MRI of the brain. I'm not sure that this really is necessary.  I still have to believe that the leg issues are coming from her back in the severe spinal stenosis.  Her platelet count is getting better. I would think that if it continues to improve, then considering a myelogram might not be a bad idea.  It is hard to set how much she is eating. She seems to be retaining some fluid. Her prealbumin is 27.3 which actually is quite good.  Her labs don't look too bad. She is iron deficient. Her ferritin is elevated because of all the arthritis. While she is in the hospital, I'll give her a dose of iron. This also might help her leg strength.  She has a chronic pain issues. This seems to be under fairly good control.  Her cultures, so far, are negative.  Her blood pressure seems be doing a little bit better.  I still do not think that the leg issues are related to her underlying malignancy. I think that a myelogram will show [place] this. Again, if her platelet count continues to improve, then a myelogram I would be a reasonable idea to see exactly the stenosis severity.  As always, I very much appreciate the wonderful job that is being done by the staff on [place], [place]  Hebrews 12:12

## 2016-02-05 NOTE — Progress Notes (Signed)
PROGRESS NOTE  Kerri Vasquez [id number] DOB: [birth year] DOA: 02/02/2016 PCP: [name], MD  HPI/Recap of past 24 hours:  bp improving,  still slow in answering questions, report pain is not well controlled, but she is drowsy and has borderline low bp  Report no bm for several days  Assessment/Plan: Active Problems:   Rib fracture   Fall   Pressure injury of skin   Fall with progressive weakness, now bed bound Has been bed bound for the last three weeks,  Ct cervical spine/mri thoracic/lumber spine no mets, fall precaution, PT/OT Unclear etiology for the progressive weakness, patient notwhave been bedbound, not able to sit up,  Ck low  paraneoplastic syndrome? She does has elevated total protein with low albumin,  Though oncology think this is less likely in breast cancer mri brain to r/o leptomingeal disease.  am cortisol level low,  cosyntropin stim test showed normal response tsh very elevated, patient admit that she has not been taking synthroid supplement regularly in the past. hypothyroidism can certainly cause generalized weakness    Thrombocytopenia:  acute, unclear etiology. She report has been off chemo for three weeks, currently on sign of infection, no acute bleed, CT ab does show numerous liver mets that is increasing in size, spleen is normal in size. enous [place] lower extremity no DVT, b12/iron unremarkable She has been taking amoxicillin, side effect from abx? Will d/c amoxicillin, hem/oncology consulted. Dr Marin Olp will get blood smear, may need bone marrow, to be determined by Dr Marin Olp. plt improving,  hypotension,  though patient does not look septic, d/c coreg,  Low bp from over sedation?, patient look drowsy, did not require additional prn pain meds, will taper fentanyl patch,   Start gentle hydration due to poor oral intake and hypotension ua and blood culture no growth, normal  lactic acid, normal  procalcitonin,  Am  cortisol low, cosyntropin stim test in process bp better on 10/7, d/c ivf, restart low dose coreg with holding parameters   Sacral decubitus ulcer presented on admission :  skin care, she report pus has resolved since she is started on amoxicillin, amoxicillin d/ced due to unexplained thrombocytopenia Wound care input appreciated "Reason for Consult:Deep tissue pressure injury (DTPI) at the coccyx in the intragluteal skin fold, partial thickness tissue loss in the periwound area, at risk heels for pressure injury, moisture management issues in the sub-pannicular and inguinal skin folds resulting in intertriginous dermatitis Wound type:Pressure moisture Pressure Ulcer POA: Yes Measurement: Coccyx wound measures 3cm x 10cm with deeper center measuring 2cm x 1cm x 0.5cm at this time. Left buttocks with partial thickness tissue loss measures 5cm x 4cm x 0.2cm. Wound bed: central area with depth is purple/red, surrounding areas of partial thickness tissue loss are erythematous with red, moist base. Bilateral inguinal area and sub-pannicular areas are moist and weeping serous fluid. Drainage (amount, consistency, odor) moderate serous and serosanguinous Periwound: erythematous, partial thickness tissue loss from previous tape use. Dressing procedure/placement/frequency: While patient is on a therapeutic mattress with low air loss feature, she requires a bariatric bed and we will arrange for that today.  Bilateral Pressure redistribution heel boots will be provided as well.  Her heels are intact and blance, but with decreased mobility and a preference for the supine position, they are at risk. Additionally, the skin fold moisture issues will be addressed with our antimicrobial textile, InterDry Ag+. Most concerning is the deep tissue pressure injury with periwound partial thickness tissue loss; I have implemented  daily topical care with an antimicrobial (silver) dressing topped with a silicone foam for the  sacral and coccyx areas.  Patient will have to turn side to side and minimize time spent in the supine position.  Discussed POC with bedside RN. [facility] nursing team will not follow routinely, but will remain available to this patient, the nursing and medical teams.  Please re-consult if ulcer deteriorates or if new pressure related areas present or if intertriginous areas of dermatitis do not respond to interventions. Thanks, Maudie Flakes, MSN, RN, [place], Arther Abbott  Pager# [phone]"   Bilateral groin candidiasis, topical nytstin   Drowsiness: from oversedation? CT head no acute findings,  Mri brain pending  H/o diastolic chf,   she state she stopped taking lasix recently, her edema has much improved compare to before bp to low for coreg, on gentle hydration due to hypotension,  bp better on 10/7, d/c ivf, restart coreg at lower dose, close monitor volume status   H/o pernicious anemia: getting b12 shots, hgb seems to be stable at baseline.  Metastatic breast cancer, has progressed , with liver mets and possble mets to omentum. Now with progressive weakness and ECOG score has declined,  oncology Dr Martha Clan following, input appreciated  Constipation: stool softener, patient declined enema  DVT prophylaxis: scd due to thrombocytopenia  Consultants: hem/oncology  Code Status: full , confirmed with patient  Family Communication:  Patient   Disposition Plan: pending, though PT recommend SNF, patient wants to go home, will arrange home health    Consultants:  Oncology Dr Marin Olp  Neurosurgery Dr Vertell Limber phone conversation regarding MRi spine findings  Procedures:  none  Antibiotics:  Amoxicillin ( continued from home meds), discontinued on 10/5   Objective: BP 110/78 (BP Location: Left Arm)   Pulse 88   Temp 98.4 F (36.9 C) (Oral)   Resp 18   Ht 5\' 6"  (1.676 m)   Wt 128.8 kg (283 lb 15.2 oz)   LMP 08/30/2006   SpO2 90%   BMI 45.83 kg/m     Intake/Output Summary (Last 24 hours) at 02/05/16 [id number] Last data filed at 02/04/16 1740  Gross per 24 hour  Intake             2155 ml  Output                0 ml  Net             2155 ml   Filed Weights   02/02/16 1902  Weight: 128.8 kg (283 lb 15.2 oz)    Exam:   General:  Chronically ill, obese, drowsy  Eyes: PERRL  ENT: unremarkable  Neck: supple, no JVD  Cardiovascular: RRR  Respiratory: diminished at basis, poor respiratory effort, no wheezing, no rales, no rhonchi  Abdomen: soft/ND/ND, positive bowel sounds  Skin: bilateral groin candidiasis changes, sacral decubitus ulcer presented on admission  Musculoskeletal:  No edema  Psychiatric: calm/cooperative  Neurologic: bilateral lower extremity not able to lift against gravity    Data Reviewed: Basic Metabolic Panel:  Recent Labs Lab 02/02/16 1321 02/02/16 1341 02/03/16 0450 02/04/16 0600 02/05/16 0603  NA 138 140 141 142 141  K 4.4 4.4 4.2 4.0 3.6  CL 108 107 111 111 113*  CO2 25  --  27 25 24   GLUCOSE 121* 117* 98 88 117*  BUN 28* 28* 26* 29* 31*  CREATININE 0.87 1.00 0.94 0.90 0.96  CALCIUM 8.0*  --  7.6* 8.1* 8.0*  MG  --   --   --   --  2.0   Liver Function Tests:  Recent Labs Lab 02/02/16 1321 02/03/16 0450  AST 15 15  ALT 29 24  ALKPHOS 44 38  BILITOT 1.0 0.8  PROT 5.5* 4.5*  ALBUMIN 3.2* 2.6*   No results for input(s): LIPASE, AMYLASE in the last 168 hours. No results for input(s): AMMONIA in the last 168 hours. CBC:  Recent Labs Lab 02/02/16 1321 02/02/16 1341 02/03/16 0450 02/04/16 0600 02/05/16 0603  WBC 5.3  --  4.3 5.1 5.4  NEUTROABS 4.6  --   --  4.0  --   HGB 9.8* 9.2* 8.9* 9.7* 9.4*  HCT 29.2* 27.0* 26.8* 30.1* 28.1*  MCV 100.3*  --  105.1* 107.1* 102.2*  PLT 68*  --  59* 64* 76*   Cardiac Enzymes:    Recent Labs Lab 02/04/16 0600  CKTOTAL 37*   BNP (last 3 results) No results for input(s): BNP in the last 8760 hours.  ProBNP (last 3  results) No results for input(s): PROBNP in the last 8760 hours.  CBG: No results for input(s): GLUCAP in the last 168 hours.  Recent Results (from the past 240 hour(s))  MRSA PCR Screening     Status: Abnormal   Collection Time: 02/03/16 10:26 AM  Result Value Ref Range Status   MRSA by PCR POSITIVE (A) NEGATIVE Final    Comment:        The GeneXpert MRSA Assay (FDA approved for NASAL specimens only), is one component of a comprehensive MRSA colonization surveillance program. It is not intended to diagnose MRSA infection nor to guide or monitor treatment for MRSA infections. RESULT CALLED TO, READ BACK BY AND VERIFIED WITH: HUGHEY,C @ 1620 ON 100517 BY POTEAT,S   Culture, blood (routine x 2)     Status: None (Preliminary result)   Collection Time: 02/03/16  3:42 PM  Result Value Ref Range Status   Specimen Description BLOOD BLOOD LEFT HAND  Final   Special Requests IN PEDIATRIC BOTTLE 1 CC  Final   Culture   Final    NO GROWTH < 24 HOURS Performed at [facility]    Report Status PENDING  Incomplete  Culture, blood (routine x 2)     Status: None (Preliminary result)   Collection Time: 02/03/16  3:42 PM  Result Value Ref Range Status   Specimen Description BLOOD BLOOD LEFT HAND  Final   Special Requests IN PEDIATRIC BOTTLE 1 CC  Final   Culture   Final    NO GROWTH < 24 HOURS Performed at [facility]    Report Status PENDING  Incomplete     Studies: No results found.  Scheduled Meds: . carvedilol  3.125 mg Oral BID WC  . Chlorhexidine Gluconate Cloth  6 each Topical Q0600  . fentaNYL  75 mcg Transdermal Q48H  . ferumoxytol  510 mg Intravenous Once  . FLUoxetine  40 mg Oral QAC breakfast  . levothyroxine  150 mcg Oral QAC breakfast  . lidocaine-prilocaine   Topical Once  . lipase/protease/amylase  24,000 Units Oral TID AC  . multivitamin with minerals  1 tablet Oral Daily  . mupirocin ointment  1 application Nasal BID  . pantoprazole  40 mg  Oral BID  . potassium chloride  40 mEq Oral Once  . pregabalin  200 mg Oral BID  . protein supplement shake  11 oz Oral TID BM  . senna-docusate  1 tablet  Oral BID  . temazepam  30 mg Oral QHS  . tiZANidine  4 mg Oral BID    Continuous Infusions:     Time spent: 10mins  Ronnisha Felber MD, PhD  [facility] Pager [phone]. If 7PM-7AM, please contact night-coverage at [email or web address], password [facility] 02/05/2016, 9:21 AM  LOS: 1 day

## 2016-02-06 ENCOUNTER — Inpatient Hospital Stay (HOSPITAL_COMMUNITY): Payer: Medicare Other

## 2016-02-06 LAB — CBC
HEMATOCRIT: 26.2 % — AB (ref 36.0–46.0)
HEMOGLOBIN: 8.5 g/dL — AB (ref 12.0–15.0)
MCH: 34.7 pg — ABNORMAL HIGH (ref 26.0–34.0)
MCHC: 32.4 g/dL (ref 30.0–36.0)
MCV: 106.9 fL — AB (ref 78.0–100.0)
Platelets: 78 10*3/uL — ABNORMAL LOW (ref 150–400)
RBC: 2.45 MIL/uL — AB (ref 3.87–5.11)
RDW: 18.8 % — ABNORMAL HIGH (ref 11.5–15.5)
WBC: 4.9 10*3/uL (ref 4.0–10.5)

## 2016-02-06 LAB — BASIC METABOLIC PANEL
ANION GAP: 4 — AB (ref 5–15)
BUN: 32 mg/dL — ABNORMAL HIGH (ref 6–20)
CALCIUM: 8 mg/dL — AB (ref 8.9–10.3)
CHLORIDE: 114 mmol/L — AB (ref 101–111)
CO2: 25 mmol/L (ref 22–32)
Creatinine, Ser: 1.15 mg/dL — ABNORMAL HIGH (ref 0.44–1.00)
GFR calc non Af Amer: 50 mL/min — ABNORMAL LOW (ref >60)
GFR, EST AFRICAN AMERICAN: 58 mL/min — AB (ref >60)
Glucose, Bld: 114 mg/dL — ABNORMAL HIGH (ref 65–99)
Potassium: 4 mmol/L (ref 3.5–5.1)
Sodium: 143 mmol/L (ref 135–145)

## 2016-02-06 LAB — URINALYSIS, ROUTINE W REFLEX MICROSCOPIC
Glucose, UA: NEGATIVE mg/dL
Hgb urine dipstick: NEGATIVE
KETONES UR: NEGATIVE mg/dL
NITRITE: NEGATIVE
PH: 6 (ref 5.0–8.0)
Protein, ur: NEGATIVE mg/dL
SPECIFIC GRAVITY, URINE: 1.028 (ref 1.005–1.030)

## 2016-02-06 LAB — URINE MICROSCOPIC-ADD ON

## 2016-02-06 MED ORDER — MAGNESIUM HYDROXIDE 400 MG/5ML PO SUSP
30.0000 mL | Freq: Once | ORAL | Status: AC
  Administered 2016-02-06: 30 mL via ORAL
  Filled 2016-02-06: qty 30

## 2016-02-06 MED ORDER — GADOBENATE DIMEGLUMINE 529 MG/ML IV SOLN
20.0000 mL | Freq: Once | INTRAVENOUS | Status: AC | PRN
  Administered 2016-02-06: 20 mL via INTRAVENOUS

## 2016-02-06 MED ORDER — SODIUM CHLORIDE 0.9 % IV SOLN
INTRAVENOUS | Status: AC
  Administered 2016-02-06: 12:00:00 via INTRAVENOUS

## 2016-02-06 MED ORDER — FLUDROCORTISONE ACETATE 0.1 MG PO TABS
0.1000 mg | ORAL_TABLET | Freq: Every day | ORAL | Status: DC
  Administered 2016-02-06 – 2016-02-09 (×4): 0.1 mg via ORAL
  Filled 2016-02-06 (×4): qty 1

## 2016-02-06 NOTE — Progress Notes (Signed)
Pt has UA STAT ordered. Prior to going down for MRI pt stated she could not urinate and would rather wait to do in/out cath until she got back from MRI.  Thomasene Lot, RN

## 2016-02-06 NOTE — Progress Notes (Signed)
PROGRESS NOTE  Bruna Bush Martinez-Mahjouba [id number] DOB: 07-01-1955 DOA: 02/02/2016 PCP: [name], MD  HPI/Recap of past 24 hours:  bp low normal on low dose coreg, cr elevated, still very drowsy  report pain is the same, no bm    Assessment/Plan: Active Problems:   Rib fracture   Fall   Pressure injury of skin   Fall with progressive weakness, now bed bound Has been bed bound for the last three weeks,  Ct cervical spine/mri thoracic/lumber spine no mets, fall precaution, PT/OT Unclear etiology for the progressive weakness, patient notwhave been bedbound, not able to sit up,  Ck low  paraneoplastic syndrome? She does has elevated total protein with low albumin,  Though oncology think this is less likely in breast cancer mri brain to r/o leptomingeal disease.  am cortisol level low,  cosyntropin stim test showed normal response tsh very elevated, patient admit that she has not been taking synthroid supplement regularly in the past. hypothyroidism can certainly cause generalized weakness    Thrombocytopenia:  acute, unclear etiology. She report has been off chemo for three weeks, currently on sign of infection, no acute bleed, CT ab does show numerous liver mets that is increasing in size, spleen is normal in size. enous [place] lower extremity no DVT, b12/iron unremarkable She has been taking amoxicillin, side effect from abx? Will d/c amoxicillin, hem/oncology consulted. Dr Marin Olp will get blood smear, may need bone marrow, to be determined by Dr Marin Olp. plt improving,  hypotension,  though patient does not look septic, d/c coreg,  Low bp from over sedation?, patient look drowsy, did not require additional prn pain meds, will taper fentanyl patch,   Start gentle hydration due to poor oral intake and hypotension ua and blood culture no growth, normal  lactic acid, normal  procalcitonin,  Am cortisol low, cosyntropin stim test showed appropriate increase, can  not rule out secondary adrenal insufficiency , will try low dose florinef.  bp better on 10/7, d/c ivf, restart low dose coreg with holding parameters Will discuss with Dr Marin Olp about continue taper pain meds, patient looks sedated eveytime when I exam her, and her blood pressure is borderline, but patient insists that her pain is not well controlled   Sacral decubitus ulcer presented on admission :  skin care, she report pus has resolved since she is started on amoxicillin, amoxicillin d/ced due to unexplained thrombocytopenia Wound care input appreciated "Reason for Consult:Deep tissue pressure injury (DTPI) at the coccyx in the intragluteal skin fold, partial thickness tissue loss in the periwound area, at risk heels for pressure injury, moisture management issues in the sub-pannicular and inguinal skin folds resulting in intertriginous dermatitis Wound type:Pressure moisture Pressure Ulcer POA: Yes Measurement: Coccyx wound measures 3cm x 10cm with deeper center measuring 2cm x 1cm x 0.5cm at this time. Left buttocks with partial thickness tissue loss measures 5cm x 4cm x 0.2cm. Wound bed: central area with depth is purple/red, surrounding areas of partial thickness tissue loss are erythematous with red, moist base. Bilateral inguinal area and sub-pannicular areas are moist and weeping serous fluid. Drainage (amount, consistency, odor) moderate serous and serosanguinous Periwound: erythematous, partial thickness tissue loss from previous tape use. Dressing procedure/placement/frequency: While patient is on a therapeutic mattress with low air loss feature, she requires a bariatric bed and we will arrange for that today.  Bilateral Pressure redistribution heel boots will be provided as well.  Her heels are intact and blance, but with decreased mobility and a  preference for the supine position, they are at risk. Additionally, the skin fold moisture issues will be addressed with our antimicrobial  textile, InterDry Ag+. Most concerning is the deep tissue pressure injury with periwound partial thickness tissue loss; I have implemented daily topical care with an antimicrobial (silver) dressing topped with a silicone foam for the sacral and coccyx areas.  Patient will have to turn side to side and minimize time spent in the supine position.  Discussed POC with bedside RN. [place] nursing team will not follow routinely, but will remain available to this patient, the nursing and medical teams.  Please re-consult if ulcer deteriorates or if new pressure related areas present or if intertriginous areas of dermatitis do not respond to interventions. Thanks, Maudie Flakes, MSN, RN, [place], Arther Abbott  Pager# [phone]"   Bilateral groin candidiasis, topical nytstin   Drowsiness: from oversedation? CT head no acute findings,  Mri brain pending  H/o diastolic chf,   she state she stopped taking lasix recently, her edema has much improved compare to before bp to low for coreg, on gentle hydration due to hypotension,  bp better on 10/7, d/c ivf, restart coreg at lower dose, close monitor volume status   H/o pernicious anemia: getting b12 shots, hgb seems to be stable at baseline.  Metastatic breast cancer, has progressed , with liver mets and possble mets to omentum. Now with progressive weakness and ECOG score has declined,  oncology Dr Martha Clan following, input appreciated  Constipation: stool softener, patient declined enema  DVT prophylaxis: scd due to thrombocytopenia  Consultants: hem/oncology  Code Status: full , confirmed with patient  Family Communication:  Patient   Disposition Plan: pending, though PT recommend SNF, patient wants to go home, will arrange home health    Consultants:  Oncology Dr Marin Olp  Neurosurgery Dr Vertell Limber phone conversation regarding MRi spine findings  Procedures:  none  Antibiotics:  Amoxicillin ( continued from home  meds), discontinued on 10/5   Objective: BP 103/71 (BP Location: Left Wrist)   Pulse 74   Temp 98.2 F (36.8 C) (Oral)   Resp 16   Ht 5\' 6"  (1.676 m)   Wt 128.8 kg (283 lb 15.2 oz)   LMP 08/30/2006   SpO2 93%   BMI 45.83 kg/m   Intake/Output Summary (Last 24 hours) at 02/06/16 0847 Last data filed at 02/05/16 1100  Gross per 24 hour  Intake              360 ml  Output                0 ml  Net              360 ml   Filed Weights   02/02/16 1902  Weight: 128.8 kg (283 lb 15.2 oz)    Exam:   General:  Chronically ill, obese, drowsy  Eyes: PERRL  ENT: unremarkable  Neck: supple, no JVD  Cardiovascular: RRR  Respiratory: diminished at basis, poor respiratory effort, no wheezing, no rales, no rhonchi  Abdomen: soft/ND/ND, positive bowel sounds  Skin: bilateral groin candidiasis changes, sacral decubitus ulcer presented on admission  Musculoskeletal:  No edema  Psychiatric: calm/cooperative  Neurologic: bilateral lower extremity not able to lift against gravity    Data Reviewed: Basic Metabolic Panel:  Recent Labs Lab 02/02/16 1321 02/02/16 1341 02/03/16 0450 02/04/16 0600 02/05/16 0603 02/06/16 0400  NA 138 140 141 142 141 143  K 4.4 4.4 4.2 4.0 3.6 4.0  CL 108 107 111 111 113* 114*  CO2 25  --  27 25 24 25   GLUCOSE 121* 117* 98 88 117* 114*  BUN 28* 28* 26* 29* 31* 32*  CREATININE 0.87 1.00 0.94 0.90 0.96 1.15*  CALCIUM 8.0*  --  7.6* 8.1* 8.0* 8.0*  MG  --   --   --   --  2.0  --    Liver Function Tests:  Recent Labs Lab 02/02/16 1321 02/03/16 0450  AST 15 15  ALT 29 24  ALKPHOS 44 38  BILITOT 1.0 0.8  PROT 5.5* 4.5*  ALBUMIN 3.2* 2.6*   No results for input(s): LIPASE, AMYLASE in the last 168 hours. No results for input(s): AMMONIA in the last 168 hours. CBC:  Recent Labs Lab 02/02/16 1321 02/02/16 1341 02/03/16 0450 02/04/16 0600 02/05/16 0603 02/06/16 0400  WBC 5.3  --  4.3 5.1 5.4 4.9  NEUTROABS 4.6  --   --  4.0   --   --   HGB 9.8* 9.2* 8.9* 9.7* 9.4* 8.5*  HCT 29.2* 27.0* 26.8* 30.1* 28.1* 26.2*  MCV 100.3*  --  105.1* 107.1* 102.2* 106.9*  PLT 68*  --  59* 64* 76* 78*   Cardiac Enzymes:    Recent Labs Lab 02/04/16 0600  CKTOTAL 37*   BNP (last 3 results) No results for input(s): BNP in the last 8760 hours.  ProBNP (last 3 results) No results for input(s): PROBNP in the last 8760 hours.  CBG: No results for input(s): GLUCAP in the last 168 hours.  Recent Results (from the past 240 hour(s))  MRSA PCR Screening     Status: Abnormal   Collection Time: 02/03/16 10:26 AM  Result Value Ref Range Status   MRSA by PCR POSITIVE (A) NEGATIVE Final    Comment:        The GeneXpert MRSA Assay (FDA approved for NASAL specimens only), is one component of a comprehensive MRSA colonization surveillance program. It is not intended to diagnose MRSA infection nor to guide or monitor treatment for MRSA infections. RESULT CALLED TO, READ BACK BY AND VERIFIED WITH: HUGHEY,C @ [id number] ON 100517 BY POTEAT,S   Culture, blood (routine x 2)     Status: None (Preliminary result)   Collection Time: 02/03/16  3:42 PM  Result Value Ref Range Status   Specimen Description BLOOD BLOOD LEFT HAND  Final   Special Requests IN PEDIATRIC BOTTLE 1 CC  Final   Culture   Final    NO GROWTH 2 DAYS Performed at [facility]    Report Status PENDING  Incomplete  Culture, blood (routine x 2)     Status: None (Preliminary result)   Collection Time: 02/03/16  3:42 PM  Result Value Ref Range Status   Specimen Description BLOOD BLOOD LEFT HAND  Final   Special Requests IN PEDIATRIC BOTTLE 1 CC  Final   Culture   Final    NO GROWTH 2 DAYS Performed at [facility]    Report Status PENDING  Incomplete     Studies: No results found.  Scheduled Meds: . carvedilol  3.125 mg Oral BID WC  . Chlorhexidine Gluconate Cloth  6 each Topical Q0600  . fentaNYL  75 mcg Transdermal Q48H  . FLUoxetine  40 mg  Oral QAC breakfast  . levothyroxine  150 mcg Oral QAC breakfast  . lidocaine-prilocaine   Topical Once  . lipase/protease/amylase  24,000 Units Oral TID AC  . multivitamin with minerals  1 tablet Oral Daily  . mupirocin ointment  1 application Nasal BID  . pantoprazole  40 mg Oral BID  . polyethylene glycol  17 g Oral Daily  . pregabalin  200 mg Oral BID  . protein supplement shake  11 oz Oral TID BM  . senna-docusate  2 tablet Oral BID  . temazepam  30 mg Oral QHS  . tiZANidine  4 mg Oral BID    Continuous Infusions:     Time spent: 38mins  Kristion Holifield MD, PhD  [facility] Pager [phone]. If 7PM-7AM, please contact night-coverage at [email or web address], password [facility] 02/06/2016, 8:47 AM  LOS: 2 days

## 2016-02-07 DIAGNOSIS — N390 Urinary tract infection, site not specified: Secondary | ICD-10-CM

## 2016-02-07 DIAGNOSIS — D649 Anemia, unspecified: Secondary | ICD-10-CM

## 2016-02-07 LAB — COMPREHENSIVE METABOLIC PANEL
ALBUMIN: 2.3 g/dL — AB (ref 3.5–5.0)
ALT: 22 U/L (ref 14–54)
ANION GAP: 3 — AB (ref 5–15)
AST: 18 U/L (ref 15–41)
Alkaline Phosphatase: 40 U/L (ref 38–126)
BILIRUBIN TOTAL: 0.5 mg/dL (ref 0.3–1.2)
BUN: 28 mg/dL — ABNORMAL HIGH (ref 6–20)
CO2: 25 mmol/L (ref 22–32)
Calcium: 7.8 mg/dL — ABNORMAL LOW (ref 8.9–10.3)
Chloride: 115 mmol/L — ABNORMAL HIGH (ref 101–111)
Creatinine, Ser: 0.83 mg/dL (ref 0.44–1.00)
Glucose, Bld: 94 mg/dL (ref 65–99)
POTASSIUM: 3.8 mmol/L (ref 3.5–5.1)
Sodium: 143 mmol/L (ref 135–145)
TOTAL PROTEIN: 4.6 g/dL — AB (ref 6.5–8.1)

## 2016-02-07 LAB — CBC
HEMATOCRIT: 23.6 % — AB (ref 36.0–46.0)
Hemoglobin: 7.6 g/dL — ABNORMAL LOW (ref 12.0–15.0)
MCH: 34.5 pg — AB (ref 26.0–34.0)
MCHC: 32.2 g/dL (ref 30.0–36.0)
MCV: 107.3 fL — AB (ref 78.0–100.0)
Platelets: 84 10*3/uL — ABNORMAL LOW (ref 150–400)
RBC: 2.2 MIL/uL — ABNORMAL LOW (ref 3.87–5.11)
RDW: 18.9 % — AB (ref 11.5–15.5)
WBC: 4.3 10*3/uL (ref 4.0–10.5)

## 2016-02-07 LAB — PROCALCITONIN

## 2016-02-07 LAB — PREPARE RBC (CROSSMATCH)

## 2016-02-07 MED ORDER — CYANOCOBALAMIN 1000 MCG/ML IJ SOLN
1000.0000 ug | Freq: Once | INTRAMUSCULAR | Status: AC
  Administered 2016-02-07: 1000 ug via INTRAMUSCULAR
  Filled 2016-02-07: qty 1

## 2016-02-07 MED ORDER — TIZANIDINE HCL 4 MG PO TABS
4.0000 mg | ORAL_TABLET | Freq: Every day | ORAL | Status: DC
  Administered 2016-02-09: 4 mg via ORAL
  Filled 2016-02-07: qty 1

## 2016-02-07 MED ORDER — BISACODYL 10 MG RE SUPP
10.0000 mg | Freq: Every day | RECTAL | Status: DC
  Administered 2016-02-08: 10 mg via RECTAL
  Filled 2016-02-07: qty 1

## 2016-02-07 MED ORDER — SODIUM CHLORIDE 0.9 % IV SOLN
Freq: Once | INTRAVENOUS | Status: AC
  Administered 2016-02-07: 14:00:00 via INTRAVENOUS

## 2016-02-07 MED ORDER — FUROSEMIDE 10 MG/ML IJ SOLN
20.0000 mg | Freq: Once | INTRAMUSCULAR | Status: AC
  Administered 2016-02-07: 20 mg via INTRAVENOUS
  Filled 2016-02-07: qty 2

## 2016-02-07 MED ORDER — FENTANYL 50 MCG/HR TD PT72
50.0000 ug | MEDICATED_PATCH | TRANSDERMAL | Status: DC
  Administered 2016-02-08: 50 ug via TRANSDERMAL
  Filled 2016-02-07: qty 1

## 2016-02-07 NOTE — Progress Notes (Signed)
PT Cancellation Note  Patient Details Name: Kerri Vasquez MRN: [id number] DOB: 06-Mar-1956   Cancelled Treatment:     PT deferred this date.  Pt with decreased BP and Hgb 7.6 with 2 unit transfusion ordered.  Will follow.   Viola Placeres 02/07/2016, 11:53 AM

## 2016-02-07 NOTE — Progress Notes (Signed)
PROGRESS NOTE  Kerri Vasquez [id number] DOB: 05/22/1955 DOA: 02/02/2016 PCP: [name], MD  HPI/Recap of past 24 hours:  bp low normal on low dose coreg, cr improved, still very drowsy  report pain is the same, no bm    Assessment/Plan: Active Problems:   Rib fracture   Fall   Pressure injury of skin   Fall with progressive weakness, now bed bound Has been bed bound for the last three weeks,  Ct cervical spine/mri thoracic/lumber spine no mets, fall precaution, PT/OT Unclear etiology for the progressive weakness, patient notwhave been bedbound, not able to sit up,  Ck low  paraneoplastic syndrome? She does has elevated total protein with low albumin,  Though oncology think this is less likely in breast cancer mri brain no leptomingeal disease.  am cortisol level low,  cosyntropin stim test showed normal response tsh very elevated, patient admit that she has not been taking synthroid supplement regularly in the past. hypothyroidism can certainly cause generalized weakness    Thrombocytopenia:  acute, unclear etiology. She report has been off chemo for three weeks, currently on sign of infection, no acute bleed, CT ab does show numerous liver mets that is increasing in size, spleen is normal in size. enous [place] lower extremity no DVT, b12/iron unremarkable She has been taking amoxicillin, side effect from abx? Will d/c amoxicillin, hem/oncology consulted. Dr Marin Olp will get blood smear, may need bone marrow, to be determined by Dr Marin Olp. plt improving,  hypotension,  though patient does not look septic, d/c coreg,  Low bp from over sedation?, patient look drowsy, did not require additional prn pain meds, will taper fentanyl patch,   Start gentle hydration due to poor oral intake and hypotension ua and blood culture no growth, normal  lactic acid, normal  procalcitonin,  Am cortisol low, cosyntropin stim test showed appropriate increase, can not  rule out secondary adrenal insufficiency ,  low dose florinef started. bp better on 10/7, d/c ivf, restart low dose coreg with holding parameters I have discussed with Dr Marin Olp about continue taper pain meds, patient looks sedated eveytime when I exam her, and her blood pressure is borderline, but patient insists that her pain is not well controlled.  I have also talked to daughter about taper pain meds, daughter report zanaflex makes patient drowsy, will change from bid to daily.   Sacral decubitus ulcer presented on admission :  skin care, she report pus has resolved since she is started on amoxicillin, amoxicillin d/ced due to unexplained thrombocytopenia Wound care input appreciated "Reason for Consult:Deep tissue pressure injury (DTPI) at the coccyx in the intragluteal skin fold, partial thickness tissue loss in the periwound area, at risk heels for pressure injury, moisture management issues in the sub-pannicular and inguinal skin folds resulting in intertriginous dermatitis Wound type:Pressure moisture Pressure Ulcer POA: Yes Measurement: Coccyx wound measures 3cm x 10cm with deeper center measuring 2cm x 1cm x 0.5cm at this time. Left buttocks with partial thickness tissue loss measures 5cm x 4cm x 0.2cm. Wound bed: central area with depth is purple/red, surrounding areas of partial thickness tissue loss are erythematous with red, moist base. Bilateral inguinal area and sub-pannicular areas are moist and weeping serous fluid. Drainage (amount, consistency, odor) moderate serous and serosanguinous Periwound: erythematous, partial thickness tissue loss from previous tape use. Dressing procedure/placement/frequency: While patient is on a therapeutic mattress with low air loss feature, she requires a bariatric bed and we will arrange for that today.  Bilateral  Pressure redistribution heel boots will be provided as well.  Her heels are intact and blance, but with decreased mobility and a  preference for the supine position, they are at risk. Additionally, the skin fold moisture issues will be addressed with our antimicrobial textile, InterDry Ag+. Most concerning is the deep tissue pressure injury with periwound partial thickness tissue loss; I have implemented daily topical care with an antimicrobial (silver) dressing topped with a silicone foam for the sacral and coccyx areas.  Patient will have to turn side to side and minimize time spent in the supine position.  Discussed POC with bedside RN. [place] nursing team will not follow routinely, but will remain available to this patient, the nursing and medical teams.  Please re-consult if ulcer deteriorates or if new pressure related areas present or if intertriginous areas of dermatitis do not respond to interventions. Thanks, Maudie Flakes, MSN, RN, [place], Arther Abbott  Pager# [phone]"   Bilateral groin candidiasis, topical nytstin   Drowsiness: from oversedation? CT head no acute findings,  Mri brain no acute findings, cut down zanaflex, and taper fentanyl as tolerated.  H/o diastolic chf,   she state she stopped taking lasix recently, her edema has much improved compare to before bp to low for coreg, on gentle hydration due to hypotension,  bp better on 10/7, d/c ivf, restart coreg at lower dose, close monitor volume status   H/o pernicious anemia: getting b12 shots, hgb seems to be stable at baseline. Dr Marin Olp ordered prbc transfusion  Metastatic breast cancer, has progressed , with liver mets and possble mets to omentum. Now with progressive weakness and ECOG score has declined,  oncology Dr Martha Clan following, input appreciated  Constipation: stool softener, patient declined enema, still no bm, will try suppository   Morbid obesity: Body mass index is 45.83 kg/m.   DVT prophylaxis: scd due to thrombocytopenia  Consultants: hem/oncology  Code Status: full , confirmed with patient  Family  Communication:  Patient in room and daughter over the phone  Disposition Plan: pending, though PT recommend SNF, patient wants to go home, will arrange home health Anticipate discharge on 10/10    Consultants:  Oncology Dr Marin Olp  Neurosurgery Dr Vertell Limber phone conversation regarding MRi spine findings  Procedures:  none  Antibiotics:  Amoxicillin ( continued from home meds), discontinued on 10/5   Objective: BP (!) 85/53 (BP Location: Left Wrist) Comment: after waking pt up and turning in bed  Pulse 76   Temp 98.2 F (36.8 C) (Oral)   Resp 16   Ht 5\' 6"  (1.676 m)   Wt 128.8 kg (283 lb 15.2 oz)   LMP 08/30/2006   SpO2 91%   BMI 45.83 kg/m   Intake/Output Summary (Last 24 hours) at 02/07/16 0844 Last data filed at 02/06/16 2005  Gross per 24 hour  Intake          1061.25 ml  Output              500 ml  Net           561.25 ml   Filed Weights   02/02/16 1902  Weight: 128.8 kg (283 lb 15.2 oz)    Exam:   General:  Chronically ill, obese, drowsy, does not seem in pain, oriented but poor memory  Eyes: PERRL  ENT: unremarkable  Neck: supple, no JVD  Cardiovascular: RRR  Respiratory: diminished at basis, poor respiratory effort, no wheezing, no rales, no rhonchi  Abdomen: soft/ND/ND, positive bowel  sounds  Skin: bilateral groin candidiasis changes, sacral decubitus ulcer presented on admission  Musculoskeletal:  No edema  Psychiatric: calm/cooperative  Neurologic: bilateral lower extremity not able to lift against gravity    Data Reviewed: Basic Metabolic Panel:  Recent Labs Lab 02/03/16 0450 02/04/16 0600 02/05/16 0603 02/06/16 0400 02/07/16 0425  NA 141 142 141 143 143  K 4.2 4.0 3.6 4.0 3.8  CL 111 111 113* 114* 115*  CO2 27 25 24 25 25   GLUCOSE 98 88 117* 114* 94  BUN 26* 29* 31* 32* 28*  CREATININE 0.94 0.90 0.96 1.15* 0.83  CALCIUM 7.6* 8.1* 8.0* 8.0* 7.8*  MG  --   --  2.0  --   --    Liver Function Tests:  Recent  Labs Lab 02/02/16 1321 02/03/16 0450 02/07/16 0425  AST 15 15 18   ALT 29 24 22   ALKPHOS 44 38 40  BILITOT 1.0 0.8 0.5  PROT 5.5* 4.5* 4.6*  ALBUMIN 3.2* 2.6* 2.3*   No results for input(s): LIPASE, AMYLASE in the last 168 hours. No results for input(s): AMMONIA in the last 168 hours. CBC:  Recent Labs Lab 02/02/16 1321  02/03/16 0450 02/04/16 0600 02/05/16 0603 02/06/16 0400 02/07/16 0425  WBC 5.3  --  4.3 5.1 5.4 4.9 4.3  NEUTROABS 4.6  --   --  4.0  --   --   --   HGB 9.8*  < > 8.9* 9.7* 9.4* 8.5* 7.6*  HCT 29.2*  < > 26.8* 30.1* 28.1* 26.2* 23.6*  MCV 100.3*  --  105.1* 107.1* 102.2* 106.9* 107.3*  PLT 68*  --  59* 64* 76* 78* 84*  < > = values in this interval not displayed. Cardiac Enzymes:    Recent Labs Lab 02/04/16 0600  CKTOTAL 37*   BNP (last 3 results) No results for input(s): BNP in the last 8760 hours.  ProBNP (last 3 results) No results for input(s): PROBNP in the last 8760 hours.  CBG: No results for input(s): GLUCAP in the last 168 hours.  Recent Results (from the past 240 hour(s))  MRSA PCR Screening     Status: Abnormal   Collection Time: 02/03/16 10:26 AM  Result Value Ref Range Status   MRSA by PCR POSITIVE (A) NEGATIVE Final    Comment:        The GeneXpert MRSA Assay (FDA approved for NASAL specimens only), is one component of a comprehensive MRSA colonization surveillance program. It is not intended to diagnose MRSA infection nor to guide or monitor treatment for MRSA infections. RESULT CALLED TO, READ BACK BY AND VERIFIED WITH: HUGHEY,C @ 1620 ON 100517 BY POTEAT,S   Culture, blood (routine x 2)     Status: None (Preliminary result)   Collection Time: 02/03/16  3:42 PM  Result Value Ref Range Status   Specimen Description BLOOD BLOOD LEFT HAND  Final   Special Requests IN PEDIATRIC BOTTLE 1 CC  Final   Culture   Final    NO GROWTH 3 DAYS Performed at [facility]    Report Status PENDING  Incomplete  Culture,  blood (routine x 2)     Status: None (Preliminary result)   Collection Time: 02/03/16  3:42 PM  Result Value Ref Range Status   Specimen Description BLOOD BLOOD LEFT HAND  Final   Special Requests IN PEDIATRIC BOTTLE 1 CC  Final   Culture   Final    NO GROWTH 3 DAYS Performed at [facility]  Report Status PENDING  Incomplete     Studies: Mr Jeri Cos Wo Contrast  Result Date: 02/06/2016 CLINICAL DATA:  History of breast cancer. Evaluation for leptomeningeal disease. EXAM: MRI HEAD WITHOUT AND WITH CONTRAST TECHNIQUE: Multiplanar, multiecho pulse sequences of the brain and surrounding structures were obtained without and with intravenous contrast. CONTRAST:  63mL MULTIHANCE GADOBENATE DIMEGLUMINE 529 MG/ML IV SOLN COMPARISON:  Head CT 02/02/2016 FINDINGS: Brain: No acute infarct or intraparenchymal hemorrhage. The midline structures are normal. There is multifocal hyperintense T2-weighted signal within the periventricular and deep white matter, most often seen in the setting of chronic microvascular ischemia. No mass lesion or midline shift. No hydrocephalus or extra-axial fluid collection. No meningeal lesions. Vascular: Major intracranial arterial and venous sinus flow voids are preserved. Extensive susceptibility on the gradient echo sequence along course of the intracranial vessels is compatible with recent iron infusion. Skull and upper cervical spine: The visualized skull base, calvarium, upper cervical spine and extracranial soft tissues are normal. Sinuses/Orbits: No fluid levels or advanced mucosal thickening. No mastoid effusion. Normal orbits. IMPRESSION: 1. No intracranial parenchymal or meningeal metastatic disease. 2. Findings of chronic microvascular ischemia without acute intracranial abnormality. Electronically Signed   By: Ulyses Jarred M.D.   On: 02/06/2016 22:14    Scheduled Meds: . sodium chloride   Intravenous Once  . Chlorhexidine Gluconate Cloth  6 each Topical  Q0600  . fentaNYL  75 mcg Transdermal Q48H  . fludrocortisone  0.1 mg Oral Daily  . FLUoxetine  40 mg Oral QAC breakfast  . levothyroxine  150 mcg Oral QAC breakfast  . lidocaine-prilocaine   Topical Once  . lipase/protease/amylase  24,000 Units Oral TID AC  . multivitamin with minerals  1 tablet Oral Daily  . mupirocin ointment  1 application Nasal BID  . pantoprazole  40 mg Oral BID  . polyethylene glycol  17 g Oral Daily  . pregabalin  200 mg Oral BID  . protein supplement shake  11 oz Oral TID BM  . senna-docusate  2 tablet Oral BID  . temazepam  30 mg Oral QHS  . tiZANidine  4 mg Oral BID    Continuous Infusions: . sodium chloride 75 mL/hr at 02/06/16 1206     Time spent: 22mins, more than 50% time spent on coordination of care, talking to subspecilty service and updating family.  Pheobe Sandiford MD, PhD  [facility] Pager [phone]. If 7PM-7AM, please contact night-coverage at [email or web address], password [facility] 02/07/2016, 8:44 AM  LOS: 3 days

## 2016-02-07 NOTE — Progress Notes (Signed)
[name] seems to be doing a little bit better. She may have a urinary tract infection. Cultures are pending. She did have the MRI of the brain on Sunday. This turned out to be okay without any obvious brain metastasis.  She seems to be moving her legs a little bit better  Her platelet count continues to improve. For now, I might hold off on doing anything invasive, such as a myelogram. Again, her legs seem to be doing a little bit better.  Her hemoglobin is down to 7.6. I am not sure as to why this is dropping. I probably would check her stools for blood. I know she has had endoscopy about 5 years ago.  Her appetite might be getting a little bit better. She's had no nausea or vomiting.  She did get IV iron on Saturday. She tolerated this pretty well.  Her vitamin B-12 level is low normal. I probably will give her a dose.  Her blood pressure still on the low side. The blood transfusion should help this. She is afebrile. Her pulse is 76.  I cannot find anything different on her physical exam. Her lungs sound pretty clear. Cardiac exam regular rate and rhythm. Abdomen is obese but soft. Bowel sounds are present. She has no abdominal mass. There is no palpable hepatosplenomegaly. Extremities shows no clubbing, cyanosis or edema. She does have some chronic lymphedema of the right arm. Neurological exam shows no focal neurological dose. She does have the weakness in her legs but this appears to be slightly better.  Again, we will transfuse her 2 units of blood. I think this will help.  I still do not believe that the leg weakness is anything to do with her breast cancer.  We will continue to follow along. We will see how the blood transfusion helps her.  Staff on 3 [address] as well as, I doing a fantastic job with her. This is a very complicated case.  Lattie Haw  Psalm 65:10

## 2016-02-08 ENCOUNTER — Other Ambulatory Visit: Payer: Self-pay | Admitting: Hematology & Oncology

## 2016-02-08 ENCOUNTER — Inpatient Hospital Stay (HOSPITAL_COMMUNITY): Payer: Medicare Other

## 2016-02-08 ENCOUNTER — Ambulatory Visit (HOSPITAL_COMMUNITY): Admission: EM | Admit: 2016-02-08 | Payer: Medicare Other | Source: Home / Self Care | Admitting: Internal Medicine

## 2016-02-08 ENCOUNTER — Inpatient Hospital Stay (HOSPITAL_COMMUNITY)
Admit: 2016-02-08 | Discharge: 2016-02-08 | Disposition: A | Discharge status: Home / Self Care | Payer: Self-pay | Attending: Internal Medicine | Admitting: Internal Medicine

## 2016-02-08 ENCOUNTER — Ambulatory Visit (HOSPITAL_COMMUNITY): Admit: 2016-02-08 | Payer: Medicare Other

## 2016-02-08 ENCOUNTER — Ambulatory Visit (HOSPITAL_COMMUNITY)
Admit: 2016-02-08 | Discharge: 2016-02-08 | Disposition: A | Discharge status: Home / Self Care | Payer: Medicare Other | Attending: Internal Medicine | Admitting: Internal Medicine

## 2016-02-08 ENCOUNTER — Ambulatory Visit (HOSPITAL_COMMUNITY): Payer: Medicare Other

## 2016-02-08 DIAGNOSIS — E039 Hypothyroidism, unspecified: Secondary | ICD-10-CM

## 2016-02-08 DIAGNOSIS — Z7689 Persons encountering health services in other specified circumstances: Secondary | ICD-10-CM | POA: Insufficient documentation

## 2016-02-08 LAB — TYPE AND SCREEN
ABO/RH(D): O POS
Antibody Screen: NEGATIVE
UNIT DIVISION: 0
UNIT DIVISION: 0

## 2016-02-08 LAB — CBC
HEMATOCRIT: 32.2 % — AB (ref 36.0–46.0)
HEMOGLOBIN: 10.6 g/dL — AB (ref 12.0–15.0)
MCH: 33.1 pg (ref 26.0–34.0)
MCHC: 32.9 g/dL (ref 30.0–36.0)
MCV: 100.6 fL — ABNORMAL HIGH (ref 78.0–100.0)
Platelets: 88 10*3/uL — ABNORMAL LOW (ref 150–400)
RBC: 3.2 MIL/uL — ABNORMAL LOW (ref 3.87–5.11)
RDW: 21.2 % — ABNORMAL HIGH (ref 11.5–15.5)
WBC: 5.7 10*3/uL (ref 4.0–10.5)

## 2016-02-08 LAB — CULTURE, BLOOD (ROUTINE X 2)
CULTURE: NO GROWTH
Culture: NO GROWTH

## 2016-02-08 LAB — MAGNESIUM: Magnesium: 1.8 mg/dL (ref 1.7–2.4)

## 2016-02-08 LAB — BASIC METABOLIC PANEL
ANION GAP: 5 (ref 5–15)
BUN: 26 mg/dL — ABNORMAL HIGH (ref 6–20)
CHLORIDE: 112 mmol/L — AB (ref 101–111)
CO2: 27 mmol/L (ref 22–32)
Calcium: 8.1 mg/dL — ABNORMAL LOW (ref 8.9–10.3)
Creatinine, Ser: 0.85 mg/dL (ref 0.44–1.00)
GFR calc non Af Amer: >60 mL/min (ref >60)
Glucose, Bld: 81 mg/dL (ref 65–99)
Potassium: 3.5 mmol/L (ref 3.5–5.1)
Sodium: 144 mmol/L (ref 135–145)

## 2016-02-08 LAB — URINE CULTURE

## 2016-02-08 MED ORDER — FUROSEMIDE 40 MG PO TABS
40.0000 mg | ORAL_TABLET | Freq: Every day | ORAL | Status: DC
  Administered 2016-02-08 – 2016-02-09 (×2): 40 mg via ORAL
  Filled 2016-02-08 (×3): qty 1

## 2016-02-08 MED ORDER — POTASSIUM CHLORIDE CRYS ER 20 MEQ PO TBCR
40.0000 meq | EXTENDED_RELEASE_TABLET | Freq: Once | ORAL | Status: AC
  Administered 2016-02-08: 40 meq via ORAL
  Filled 2016-02-08: qty 2

## 2016-02-08 NOTE — Progress Notes (Addendum)
PROGRESS NOTE  Kerri Vasquez [id number] DOB: February 17, 1956 DOA: 02/02/2016 PCP: [name], MD  Brief summary:  Metastatic breast ca progressed on chemo, admitted s/p fall, rib fracture, progressive weakness, bedbound. Dr Martha Clan consulted. D/c to home on 10/11 if mri c spine no acute findings.    HPI/Recap of past 24 hours:  bp better, cr normalized, seems more awake  report pain is the same, had finally had  bm today   Assessment/Plan: Active Problems:   Rib fracture   Fall   Pressure injury of skin   Fall with progressive weakness, now bed bound, likely multifactorial Has been bed bound for the last three weeks,   Unclear etiology for the progressive weakness, patient now have been bedbound for several weeks, not able to sit up,    paraneoplastic syndrome? She does has elevated total protein with low albumin,  Though oncology think this is less likely in breast cancer mri brain no leptomingeal disease.  Low CK,  am cortisol level low,  cosyntropin stim test showed normal response, can not rule out secondary adrenal insufficiency ,  low dose florinef started. tsh very elevated, patient admit that she has not been taking synthroid supplement regularly in the past. hypothyroidism can certainly cause generalized weakness Over sedation from pain medication could also contribute. Ct cervical spine/mri thoracic/lumbar spine no mets,  But does show severe degenerative disease. I have discussed with neurosurgery and radiology who both states the degenerative changes on her thoracic and lumbar spine are chronic , less likely to be the cause of her weakness. CT myelogram will not offer more information. Radiology recommended cervical MRI for completeness, due to ct c spine is not sufficient to evaluate cervical spine. Patient is not able to fit in MRI machine at [facility] long, she is to transfer to  for mri by careline, then come back to [place].    If mri c spine no acute findings, she is to discharge home with home health on 10/10.  Daughter also reports patient has bilateral knee problems, bilateral knee x ray performed, does has degenerative changes , but no acute changes.  Drowsiness: from oversedation? CT head no acute findings,  Mri brain no acute findings, cut down zanaflex  (daughter report zanaflex makes patient drowsy,), and taper fentanyl as tolerated. I have discussed with patient , her daughter and Dr Marin Olp about cutting down fentanyl as tolerated.   hypotension,  though patient does not look septic,  Low bp from over sedation?, patient look drowsy, did not require additional prn pain meds, will taper fentanyl patch,   She received gentle hydration due to poor oral intake and hypotension ua and blood culture no growth, normal  lactic acid, normal  procalcitonin,  Am cortisol low, cosyntropin stim test showed appropriate increase, can not rule out secondary adrenal insufficiency ,  low dose florinef started. bp better on 10/7, d/c ivf, restart low dose coreg with holding parameters Taper analgesic as tolerated  Thrombocytopenia:  acute, unclear etiology. She report has been off chemo for three weeks, currently on sign of infection, no acute bleed, CT ab does show numerous liver mets that is increasing in size, spleen is normal in size. enous [place] lower extremity no DVT, b12/iron unremarkable She has been taking amoxicillin, side effect from abx? Will d/c amoxicillin, hem/oncology consulted. Dr Marin Olp will get blood smear, may need bone marrow, to be determined by Dr Marin Olp. plt improving,  Sacral decubitus ulcer presented on admission :  skin care, she report pus has resolved since she is started on amoxicillin, amoxicillin d/ced due to unexplained thrombocytopenia Wound care input appreciated "Reason for Consult:Deep tissue pressure injury (DTPI) at the coccyx in the intragluteal skin fold, partial thickness tissue  loss in the periwound area, at risk heels for pressure injury, moisture management issues in the sub-pannicular and inguinal skin folds resulting in intertriginous dermatitis Wound type:Pressure moisture Pressure Ulcer POA: Yes Measurement: Coccyx wound measures 3cm x 10cm with deeper center measuring 2cm x 1cm x 0.5cm at this time. Left buttocks with partial thickness tissue loss measures 5cm x 4cm x 0.2cm. Wound bed: central area with depth is purple/red, surrounding areas of partial thickness tissue loss are erythematous with red, moist base. Bilateral inguinal area and sub-pannicular areas are moist and weeping serous fluid. Drainage (amount, consistency, odor) moderate serous and serosanguinous Periwound: erythematous, partial thickness tissue loss from previous tape use. Dressing procedure/placement/frequency: While patient is on a therapeutic mattress with low air loss feature, she requires a bariatric bed and we will arrange for that today.  Bilateral Pressure redistribution heel boots will be provided as well.  Her heels are intact and blance, but with decreased mobility and a preference for the supine position, they are at risk. Additionally, the skin fold moisture issues will be addressed with our antimicrobial textile, InterDry Ag+. Most concerning is the deep tissue pressure injury with periwound partial thickness tissue loss; I have implemented daily topical care with an antimicrobial (silver) dressing topped with a silicone foam for the sacral and coccyx areas.  Patient will have to turn side to side and minimize time spent in the supine position.  Discussed POC with bedside RN. [place] nursing team will not follow routinely, but will remain available to this patient, the nursing and medical teams.  Please re-consult if ulcer deteriorates or if new pressure related areas present or if intertriginous areas of dermatitis do not respond to interventions. Thanks, Maudie Flakes, MSN, RN, [place],  Arther Abbott  Pager# [phone]"   Bilateral groin candidiasis, topical nytstin  H/o diastolic chf,   she state she stopped taking lasix recently, her edema has much improved compare to before coreg held due to hypotension, restarted at lower dose with holding parameter, she received brief gentle hydration due to hypotension,  Lasix restarted at lower dose 40mg  po qd on 10/10   H/o pernicious anemia: getting b12 shots, hgb seems to be stable at baseline s/p prbc transfusion 10/9  Metastatic breast cancer, has progressed , with liver mets and possble mets to omentum. Now with progressive weakness and ECOG score has declined,  oncology Dr Martha Clan following, input appreciated  Constipation: stool softener, patient declined enema, suppository   MRSA colonization: contact precaution, decolonization protocol.   Morbid obesity: Body mass index is 45.83 kg/m.   DVT prophylaxis: scd due to thrombocytopenia  Consultants: hem/oncology  Code Status: full , confirmed with patient  Family Communication:  Patient in room and daughter over the phone on 10/10  Disposition Plan: pending, though PT recommend SNF, patient wants to go home, will arrange home health Anticipate discharge on 10/11 if mri c spine no acute findings    Consultants:  Oncology Dr Marin Olp  Neurosurgery Dr Vertell Limber phone conversation regarding MRi spine findings  Procedures:  none  Antibiotics:  Amoxicillin ( continued from home meds), discontinued on 10/5   Objective: BP 128/75 (BP Location: Left Wrist)   Pulse 87   Temp 98.8 F (37.1 C) (Oral)  Resp 16   Ht 5\' 6"  (1.676 m)   Wt 128.8 kg (283 lb 15.2 oz)   LMP 08/30/2006   SpO2 95%   BMI 45.83 kg/m   Intake/Output Summary (Last 24 hours) at 02/08/16 1013 Last data filed at 02/07/16 1830  Gross per 24 hour  Intake              260 ml  Output                0 ml  Net              260 ml   Filed Weights   02/02/16 1902    Weight: 128.8 kg (283 lb 15.2 oz)    Exam:   General:  Chronically ill, obese, drowsy, does not seem in pain, oriented but poor memory  Eyes: PERRL  ENT: unremarkable  Neck: supple, no JVD  Cardiovascular: RRR  Respiratory: diminished at basis, poor respiratory effort, no wheezing, no rales, no rhonchi  Abdomen: soft/ND/ND, positive bowel sounds  Skin: bilateral groin candidiasis changes, sacral decubitus ulcer presented on admission  Musculoskeletal:  No edema  Psychiatric: calm/cooperative  Neurologic: bilateral lower extremity not able to lift against gravity    Data Reviewed: Basic Metabolic Panel:  Recent Labs Lab 02/04/16 0600 02/05/16 0603 02/06/16 0400 02/07/16 0425 02/08/16 0545  NA 142 141 143 143 144  K 4.0 3.6 4.0 3.8 3.5  CL 111 113* 114* 115* 112*  CO2 25 24 25 25 27   GLUCOSE 88 117* 114* 94 81  BUN 29* 31* 32* 28* 26*  CREATININE 0.90 0.96 1.15* 0.83 0.85  CALCIUM 8.1* 8.0* 8.0* 7.8* 8.1*  MG  --  2.0  --   --  1.8   Liver Function Tests:  Recent Labs Lab 02/02/16 1321 02/03/16 0450 02/07/16 0425  AST 15 15 18   ALT 29 24 22   ALKPHOS 44 38 40  BILITOT 1.0 0.8 0.5  PROT 5.5* 4.5* 4.6*  ALBUMIN 3.2* 2.6* 2.3*   No results for input(s): LIPASE, AMYLASE in the last 168 hours. No results for input(s): AMMONIA in the last 168 hours. CBC:  Recent Labs Lab 02/02/16 1321  02/04/16 0600 02/05/16 0603 02/06/16 0400 02/07/16 0425 02/08/16 0545  WBC 5.3  < > 5.1 5.4 4.9 4.3 5.7  NEUTROABS 4.6  --  4.0  --   --   --   --   HGB 9.8*  < > 9.7* 9.4* 8.5* 7.6* 10.6*  HCT 29.2*  < > 30.1* 28.1* 26.2* 23.6* 32.2*  MCV 100.3*  < > 107.1* 102.2* 106.9* 107.3* 100.6*  PLT 68*  < > 64* 76* 78* 84* 88*  < > = values in this interval not displayed. Cardiac Enzymes:    Recent Labs Lab 02/04/16 0600  CKTOTAL 37*   BNP (last 3 results) No results for input(s): BNP in the last 8760 hours.  ProBNP (last 3 results) No results for input(s):  PROBNP in the last 8760 hours.  CBG: No results for input(s): GLUCAP in the last 168 hours.  Recent Results (from the past 240 hour(s))  MRSA PCR Screening     Status: Abnormal   Collection Time: 02/03/16 10:26 AM  Result Value Ref Range Status   MRSA by PCR POSITIVE (A) NEGATIVE Final    Comment:        The GeneXpert MRSA Assay (FDA approved for NASAL specimens only), is one component of a comprehensive MRSA colonization surveillance program. It is  not intended to diagnose MRSA infection nor to guide or monitor treatment for MRSA infections. RESULT CALLED TO, READ BACK BY AND VERIFIED WITH: HUGHEY,C @ [id number] ON [id number] BY POTEAT,S   Culture, blood (routine x 2)     Status: None (Preliminary result)   Collection Time: 02/03/16  3:42 PM  Result Value Ref Range Status   Specimen Description BLOOD BLOOD LEFT HAND  Final   Special Requests IN PEDIATRIC BOTTLE 1 CC  Final   Culture   Final    NO GROWTH 4 DAYS Performed at [facility]    Report Status PENDING  Incomplete  Culture, blood (routine x 2)     Status: None (Preliminary result)   Collection Time: 02/03/16  3:42 PM  Result Value Ref Range Status   Specimen Description BLOOD BLOOD LEFT HAND  Final   Special Requests IN PEDIATRIC BOTTLE 1 CC  Final   Culture   Final    NO GROWTH 4 DAYS Performed at [facility]    Report Status PENDING  Incomplete  Urine culture     Status: Abnormal   Collection Time: 02/06/16  7:52 PM  Result Value Ref Range Status   Specimen Description URINE, CLEAN CATCH  Final   Special Requests NONE  Final   Culture MULTIPLE SPECIES PRESENT, SUGGEST RECOLLECTION (A)  Final   Report Status 02/08/2016 FINAL  Final     Studies: No results found.  Scheduled Meds: . bisacodyl  10 mg Rectal Daily  . Chlorhexidine Gluconate Cloth  6 each Topical Q0600  . fentaNYL  50 mcg Transdermal Q48H  . fludrocortisone  0.1 mg Oral Daily  . FLUoxetine  40 mg Oral QAC breakfast  .  levothyroxine  150 mcg Oral QAC breakfast  . lidocaine-prilocaine   Topical Once  . lipase/protease/amylase  24,000 Units Oral TID AC  . multivitamin with minerals  1 tablet Oral Daily  . mupirocin ointment  1 application Nasal BID  . pantoprazole  40 mg Oral BID  . polyethylene glycol  17 g Oral Daily  . pregabalin  200 mg Oral BID  . protein supplement shake  11 oz Oral TID BM  . senna-docusate  2 tablet Oral BID  . temazepam  30 mg Oral QHS  . tiZANidine  4 mg Oral QHS    Continuous Infusions:     Time spent: 20mins, more than 50% time spent on coordination of care, talking to subspecilty service and updating family.  Selia Wareing MD, PhD  [facility] Pager [phone]. If 7PM-7AM, please contact night-coverage at [email or web address], password [facility] 02/08/2016, 10:13 AM  LOS: 4 days

## 2016-02-08 NOTE — Progress Notes (Signed)
OT Cancellation Note  Patient Details Name: Emilyn Avera MRN: [id number] DOB: [birth year]    Cancelled Treatment:    Reason Eval/Treat Not Completed: Other (comment) Pt gone for xray then eating lunch- will recheck on pt next day  Kari Baars, [place] Payton Mccallum D 02/08/2016, 12:32 PM

## 2016-02-08 NOTE — Progress Notes (Addendum)
Physical Therapy Treatment Patient Details Name: Kerri Vasquez MRN: [id number] DOB: Jun 21, 1955 Today's Date: 02/08/2016    History of Present Illness 60yo F adm With metastatic breast cancer ,currently off therapy, reported progressive weakness, has been in bed for the last three weeks, she fell out of bed yesterday and hit her abdomen and sustained nondisplaced right eighth rib fracture, she presented to the ED 02/02/16 due to increased pain.    PT Comments    Assisted patient with A/AAROM exercises to all extremities. Assisted to sitting upright in long sitting on bed by adjusting bed to facilitate  Her  Being able to sit up straight. She tolerated well. Remains severely weak and deconditioned and does not appear that she has leg strength to consider standing. Recommend mechanical lift OOB.  Recommend getting the bariatric recliner from portable equipment and  Consider moving to a room with a maxisky lift  For easier mobility OOB to recliner.   Pt admitted with above diagnosis. Pt currently with functional limitations due to the deficits listed below (see PT Problem List).  Pt will benefit from skilled PT to increase their independence and safety with mobility to allow discharge to the venue listed below.     Follow Up Recommendations  SNF;Home health PT;Supervision/Assistance - 24 hour     Equipment Recommendations  Wheelchair (measurements PT);Wheelchair cushion (measurements PT);Hospital bed [facility])    Recommendations for Other Services       Precautions / Restrictions Precautions Precautions: Fall Precaution Comments: multiple falls, slide of and fell off    Mobility  Bed Mobility Overal bed mobility: +2 for physical assistance;+ 2 for safety/equipment Bed Mobility: Supine to Sit     Supine to sit: +2 for physical assistance;+2 for safety/equipment;Mod assist     General bed mobility comments: Bed tilted with foot down, HOB raised, patient assisted to sitting  upright in long sitting on bed by reaching  forward on the bed rails and was able to hold herself  up in long sitting x 4 minutes.  Patient reports that the paosition felt good on her back to be off of it.  Transfers                 General transfer comment: unable  Ambulation/Gait                 Stairs            Wheelchair Mobility    Modified Rankin (Stroke Patients Only)       Balance Overall balance assessment: Needs assistance;History of Falls Sitting-balance support: Bilateral upper extremity supported   Sitting balance - Comments: in long sitting able to sit  upright with use of rails and mod assist to get to position then held herself up.                            Cognition Arousal/Alertness: Awake/alert Behavior During Therapy: WFL for tasks assessed/performed Overall Cognitive Status: Within Functional Limits for tasks assessed                      Exercises General Exercises - Upper Extremity Shoulder Flexion: AAROM;Strengthening;Both;5 reps Shoulder ABduction: AAROM;Both;5 reps Shoulder Horizontal ADduction: AAROM;Strengthening;5 reps;Both Elbow Flexion: AROM;Strengthening;Both;5 reps Elbow Extension: AROM;Strengthening;Both;5 reps General Exercises - Lower Extremity Quad Sets: Both;5 reps Short Arc Quad: Both;5 reps Heel Slides: Both;5 reps Hip ABduction/ADduction: Both;5 reps Straight Leg Raises: Both;5 reps    General  Comments        Pertinent Vitals/Pain Pain Assessment: Faces Faces Pain Scale: Hurts little more Pain Location: back Pain Descriptors / Indicators: Discomfort Pain Intervention(s): Limited activity within patient's tolerance;Monitored during session    Home Living                      Prior Function            PT Goals (current goals can now be found in the care plan section) Progress towards PT goals: Progressing toward goals    Frequency    Min 3X/week      PT  Plan Current plan remains appropriate    Co-evaluation             End of Session   Activity Tolerance: Patient tolerated treatment well Patient left: in bed;with call bell/phone within reach     Time: [id number] PT Time Calculation (min) (ACUTE ONLY): 25 min  Charges:  $Therapeutic Exercise: 23-37 mins                    G Codes:      Claretha Cooper 02/08/2016, 4:18 PM Tresa Endo PT [phone]

## 2016-02-08 NOTE — Progress Notes (Signed)
[name] did well with the 2 units of blood. Her hemoglobin up to 10.8. She did receive some iron over the weekend.  Her legs still are quite weak. This is her big problem. I'm just not sure what can be done for this. Maybe it will help to get a CT myelogram to see exactly how bad this stenosis is. This might be able to guide some type of epidural therapy. Again I just would doubt that any surgeon would operate on her given all of her health issues.  It would be nice if she was somehow more mobile. I know that physical therapy is trying to work with her. I'm unsure how much they would be able to do.  She was markedly hypothyroid. She is supposedly on Synthroid. I don't know she's been taking this or not.  She is not eating all that much. I don't know if there is no element of depression here. She just seems quite depressed when I saw her this morning. She had a flat affect. She just wants to be able to walk again. She is a gets around with a cane.  Her labs today look okay. Platelet continues to come up. It is now 88,000. Her hemoglobin is 10.6. Her white cell count is 5.7. Her potassium is 3.5.  On her physical exam, her vital signs look pretty good. Her blood pressure is 128/75. Her pulse is 87. Her temperature is 98.8. Her head and neck exam shows no ocular or oral lesions. There is no adenopathy in the neck. Lungs are with some decrease at the bases. Cardiac exam regular rate and rhythm. Abdomen is obese but soft. There is no guarding or rebound tenderness. There is no palpable liver or spleen tip. Extremities shows the compression boots on. She has some chronic 1-2+ edema. She does move both extremities although there is obvious weakness. Neurological exam shows a focal neurological deficit.  I just worry about her try to go home area and her daughter has health issues herself. I just am not sure that her daughter is able to care for her.  Again, maybe it would not be about idea to consider  a CT myelogram on her. We can see how bad her stenosis is. This might be able to help the radiologist with respect to epidural injections.  I know that physical therapy is trying her best with her.  I do not know if there is an element of depression.  The staff on [address] and the hospitalist are doing a fantastic job. This is an incredibly complicated situation.  Lattie Haw, MD  Psalm 64:10

## 2016-02-08 NOTE — Progress Notes (Signed)
Pt left for [facility] for MRI at this time

## 2016-02-08 NOTE — Care Management Important Message (Signed)
Important Message  Patient Details IM Letter given to [name] to present Patient Name: Kerri Vasquez MRN: [id number] Date of Birth: 12/26/1955   Medicare Important Message Given:  Yes    [name] 02/08/2016, 10:00 AMImportant Message  Patient Details  Name: Kerri Vasquez MRN: [id number] Date of Birth: Apr 09, 1956   Medicare Important Message Given:  Yes    [name] 02/08/2016, 10:00 AM

## 2016-02-09 ENCOUNTER — Ambulatory Visit (HOSPITAL_COMMUNITY): Payer: Medicare Other

## 2016-02-09 DIAGNOSIS — E876 Hypokalemia: Secondary | ICD-10-CM

## 2016-02-09 DIAGNOSIS — S2231XD Fracture of one rib, right side, subsequent encounter for fracture with routine healing: Secondary | ICD-10-CM

## 2016-02-09 DIAGNOSIS — R609 Edema, unspecified: Secondary | ICD-10-CM

## 2016-02-09 DIAGNOSIS — B379 Candidiasis, unspecified: Secondary | ICD-10-CM

## 2016-02-09 DIAGNOSIS — L89152 Pressure ulcer of sacral region, stage 2: Secondary | ICD-10-CM

## 2016-02-09 LAB — BASIC METABOLIC PANEL
Anion gap: 5 (ref 5–15)
BUN: 24 mg/dL — AB (ref 6–20)
CALCIUM: 7.9 mg/dL — AB (ref 8.9–10.3)
CHLORIDE: 110 mmol/L (ref 101–111)
CO2: 28 mmol/L (ref 22–32)
CREATININE: 0.8 mg/dL (ref 0.44–1.00)
GFR calc non Af Amer: >60 mL/min (ref >60)
GLUCOSE: 98 mg/dL (ref 65–99)
Potassium: 3.2 mmol/L — ABNORMAL LOW (ref 3.5–5.1)
Sodium: 143 mmol/L (ref 135–145)

## 2016-02-09 LAB — CBC
HEMATOCRIT: 30 % — AB (ref 36.0–46.0)
HEMOGLOBIN: 10 g/dL — AB (ref 12.0–15.0)
MCH: 33.8 pg (ref 26.0–34.0)
MCHC: 33.3 g/dL (ref 30.0–36.0)
MCV: 101.4 fL — ABNORMAL HIGH (ref 78.0–100.0)
Platelets: 104 10*3/uL — ABNORMAL LOW (ref 150–400)
RBC: 2.96 MIL/uL — ABNORMAL LOW (ref 3.87–5.11)
RDW: 20.6 % — AB (ref 11.5–15.5)
WBC: 5.2 10*3/uL (ref 4.0–10.5)

## 2016-02-09 MED ORDER — FLUCONAZOLE 100 MG PO TABS: 100.0000 mg | ORAL_TABLET | Freq: Every day | ORAL | 0 refills | Status: DC

## 2016-02-09 MED ORDER — FENTANYL 50 MCG/HR TD PT72: 50.0000 ug | MEDICATED_PATCH | TRANSDERMAL | 0 refills | Status: DC

## 2016-02-09 MED ORDER — PREGABALIN 200 MG PO CAPS: 200.0000 mg | ORAL_CAPSULE | Freq: Two times a day (BID) | ORAL | 0 refills | Status: DC

## 2016-02-09 MED ORDER — HEPARIN SOD (PORK) LOCK FLUSH 100 UNIT/ML IV SOLN
500.0000 [IU] | INTRAVENOUS | Status: AC | PRN
  Administered 2016-02-09: 500 [IU]
  Filled 2016-02-09: qty 5

## 2016-02-09 MED ORDER — FLUDROCORTISONE ACETATE 0.1 MG PO TABS: 0.1000 mg | ORAL_TABLET | Freq: Every day | ORAL | 0 refills | Status: DC

## 2016-02-09 MED ORDER — POTASSIUM CHLORIDE 10 MEQ/50ML IV SOLN
10.0000 meq | INTRAVENOUS | Status: AC
  Administered 2016-02-09 (×3): 10 meq via INTRAVENOUS
  Filled 2016-02-09 (×2): qty 50

## 2016-02-09 MED ORDER — FLUCONAZOLE 100 MG PO TABS
100.0000 mg | ORAL_TABLET | Freq: Every day | ORAL | Status: DC
  Administered 2016-02-09: 100 mg via ORAL
  Filled 2016-02-09 (×2): qty 1

## 2016-02-09 MED ORDER — FUROSEMIDE 40 MG PO TABS: 40.0000 mg | ORAL_TABLET | Freq: Every day | ORAL | Status: DC

## 2016-02-09 MED ORDER — PANCRELIPASE (LIP-PROT-AMYL) 24000-76000 UNITS PO CPEP: 24000.0000 [IU] | ORAL_CAPSULE | Freq: Three times a day (TID) | ORAL | 0 refills | Status: DC

## 2016-02-09 MED ORDER — GADOBENATE DIMEGLUMINE 529 MG/ML IV SOLN
20.0000 mL | Freq: Once | INTRAVENOUS | Status: AC
  Administered 2016-02-09: 20 mL via INTRAVENOUS

## 2016-02-09 NOTE — Discharge Summary (Addendum)
Physician Discharge Summary  Kerri Vasquez [id number] DOB: 03/01/1956 DOA: 02/02/2016  PCP: [name], MD  Admit date: 02/02/2016 Discharge date: 02/09/2016  Admitted From: Home.  Disposition: HOme.   Recommendations for Outpatient Follow-up:  1. Follow up with PCP in 1-2 weeks 2. Please obtain BMP/CBC in one week 3. Please follow up on the following pending results:  Home Health:Yes,  Equipment/Devices:hospital bed.   Discharge Condition:stable.  CODE STATUS:full code.  Diet recommendation: Heart Healthy    Brief/Interim Summary: Kerri Vasquez is a 60 y.o. female With metastatic breast cancer ,currently off therapy, reported progressive weakness, has been in bed for three weeks prior to admission,  fell out of bed and hit her abdomen and ribs, she presented to the ED  due to increased pain. ED course, her vital signs are stable, CT head cervical spine, chest and abdomen does show minimal displaced right 8th rib fracture without pneumothorax, there are also signs of cancer progression, hospitalist called to admit the patient due to fall, FTT and pain control. Oncology consulted and recommendations given.   Discharge Diagnoses:  Active Problems:   Rib fracture   Fall   Pressure injury of skin  Fall with progressive weakness, now bed bound, likely multifactorial Has been bed bound for the last three weeks,   Unclear etiology for the progressive weakness, patient now have been bedbound for several weeks, not able to sit up,  probably from disease progression.  tsh very elevated, patient admit that she has not been taking synthroid supplement regularly in the past. hypothyroidism can certainly cause generalized weakness Over sedation from pain medication could also contribute. Ct cervical spine/mri thoracic/lumbar spine no mets,  But does show severe degenerative disease. I have discussed with neurosurgery and radiology who both states the  degenerative changes on her thoracic and lumbar spine are chronic , less likely to be the cause of her weakness. CT myelogram will not offer more information. Radiology recommended cervical MRI for completeness MRI cervical spine negative for cord lesion, mild spondylosis seen. Discussed the findings with daughter and the patient.   Daughter also reports patient has bilateral knee problems, bilateral knee x ray performed, does has degenerative changes , but no acute changes.  Drowsiness: from oversedation? CT head no acute findings,  Mri brain no acute findings, cut down zanaflex  (daughter report zanaflex makes patient drowsy,), and taper fentanyl as tolerated. I have discussed with patient , her daughter and Dr Marin Olp about cutting down fentanyl as tolerated.   hypotension,  though patient does not look septic,  Low bp from over sedation?, patient look drowsy, did not require additional prn pain meds, will taper fentanyl patch,   She received gentle hydration due to poor oral intake and hypotension ua and blood culture no growth, normal  lactic acid, normal  procalcitonin,  Am cortisol low, cosyntropin stim test showed appropriate increase, can not rule out secondary adrenal insufficiency ,  low dose florinef started. bp better on 10/7, d/c ivf, restart low dose coreg with holding parameters Taper analgesic as tolerated  Thrombocytopenia:  acute, unclear etiology. She report has been off chemo for three weeks, currently no sign of infection, no acute bleed, CT ab does show numerous liver mets that is increasing in size, spleen is normal in size. venous US lower extremity no DVT, She has been taking amoxicillin, side effect from abx? Will d/c amoxicillin, Oncology consulted and her platelets are improving. Recommend checking cbc at oncology office in one week.  Sacral decubitus ulcer presented on admission :  Wound care consulted,  For Deep tissue pressure injury (DTPI) at the  coccyx in the intragluteal skin fold, partial thickness tissue loss in the periwound area, at risk heels for pressure injury, moisture management issues in the sub-pannicular and inguinal skin folds resulting in intertriginous dermatitis  Measurement: Coccyx wound measures 3cm x 10cm with deeper center measuring 2cm x 1cm x 0.5cm at this time. Left buttocks with partial thickness tissue loss measures 5cm x 4cm x 0.2cm. Wound bed: central area with depth is purple/red, surrounding areas of partial thickness tissue loss are erythematous with red, moist base. Bilateral inguinal area and sub-pannicular areas are moist and weeping serous fluid. Drainage (amount, consistency, odor) moderate serous and serosanguinous  Dressing procedure/placement/frequency: While patient is on a therapeutic mattress with low air loss feature, she requires a bariatric bed . Bilateral Pressure redistribution heel boots will be provided as well. Her heels are intact and blance, but with decreased mobility and a preference for the supine position, they are at risk. Additionally, the skin fold moisture issues will be addressed with our antimicrobial textile, InterDry Ag+. Most concerning is the deep tissue pressure injury with periwound partial thickness tissue loss recommended daily topical care with an antimicrobial (silver) dressing topped with a silicone foam for the sacral and coccyx areas. Patient will have to turn side to side and minimize time spent in the supine position.     Bilateral groin candidiasis, topical nytstin  H/o  Grade 1 diastolic chf,  She appears to be compensated. Restarted lasix at a lower dose.  Monitor potassium while on lasix.   H/o pernicious anemia: getting b12 shots, hgb seems to be stable at baseline s/p prbc transfusion 10/9. Hemoglobin stable around 10.   Metastatic breast cancer, has progressed , with liver mets and possble mets to omentum. Now with progressive weakness and ECOG  score has declined, oncology Dr Martha Clan following, input appreciated. Outpatient follow up scheduled.   Constipation: stool softener, patient declined enema, suppository. Resolved.   MRSA colonization: contact precaution, decolonization protocol.   Morbid obesity: Body mass index is 45.83 kg/m.  Hypokalemia: replete as needed.    Discharge Instructions  Discharge Instructions    Diet general    Complete by:  As directed    Discharge instructions    Complete by:  As directed    Please follow up with dr Marin Olp as recommended.  Please get a BMP by [facility] in two days to check your potassium level.   Face-to-face encounter (required for Medicare/Medicaid patients)    Complete by:  As directed    I Xu,Fang certify that this patient is under my care and that I, or a nurse practitioner or physician's assistant working with me, had a face-to-face encounter that meets the physician face-to-face encounter requirements with this patient on 02/04/2016. The encounter with the patient was in whole, or in part for the following medical condition(s) which is the primary reason for home health care (List medical condition): FTT   The encounter with the patient was in whole, or in part, for the following medical condition, which is the primary reason for home health care:  FTT   I certify that, based on my findings, the following services are medically necessary home health services:   Nursing Physical therapy     Reason for Medically Necessary Home Health Services:  Skilled Nursing- Change/Decline in Patient Status   My clinical findings support the need for the above  services:  Bedbound   Further, I certify that my clinical findings support that this patient is homebound due to:  Unable to leave home safely without assistance   For home use only DME Hospital bed    Complete by:  As directed    Also provide  [facility] lift .   Bed type:  Semi-electric   Home Health    Complete by:  As directed     To provide the following care/treatments:   PT OT RN         Medication List    STOP taking these medications   ampicillin 500 MG capsule Commonly known as:  PRINCIPEN   capecitabine 500 MG tablet Commonly known as:  XELODA   carvedilol 6.25 MG tablet Commonly known as:  COREG   dexamethasone 4 MG tablet Commonly known as:  DECADRON   temazepam 15 MG capsule Commonly known as:  RESTORIL     TAKE these medications   cyanocobalamin 1000 MCG/ML injection Commonly known as:  (VITAMIN B-12) INJECT 1 ML AS DIRECTED EVERY 21 DAYS   diphenoxylate-atropine 2.5-0.025 MG tablet Commonly known as:  LOMOTIL TAKE 1 TABLET BY MOUTH FOUR TIMES DAILY AS NEEDED FOR DIARRHEA   fentaNYL 50 MCG/HR Commonly known as:  DURAGESIC - dosed mcg/hr Place 1 patch (50 mcg total) onto the skin every other day. Start taking on:  02/10/2016 What changed:  how much to take  Another medication with the same name was removed. Continue taking this medication, and follow the directions you see here.   fluconazole 100 MG tablet Commonly known as:  DIFLUCAN Take 1 tablet (100 mg total) by mouth daily. Start taking on:  02/10/2016   fludrocortisone 0.1 MG tablet Commonly known as:  FLORINEF Take 1 tablet (0.1 mg total) by mouth daily.   FLUoxetine 40 MG capsule Commonly known as:  PROZAC Take 40 mg by mouth daily before breakfast.   furosemide 40 MG tablet Commonly known as:  LASIX Take 1 tablet (40 mg total) by mouth daily. What changed:  medication strength  how much to take   ibuprofen 600 MG tablet Commonly known as:  ADVIL,MOTRIN Take 600 mg by mouth every 8 (eight) hours as needed for pain.   levothyroxine 150 MCG tablet Commonly known as:  SYNTHROID, LEVOTHROID Take 1 tablet (150 mcg total) by mouth daily.   lidocaine-prilocaine cream Commonly known as:  EMLA Apply 1 application topically as needed for port access   LORazepam 0.5 MG tablet Commonly known as:   ATIVAN PLACE 1 TABLET UNDER TONGUE EVERY 6 HOURS AS NEEDED FOR NAUSEA AND VOMITING   methocarbamol 750 MG tablet Commonly known as:  ROBAXIN TAKE 1 TABLET BY MOUTH THREE TIMES DAILY AS NEEDED   MULTI-VITAMINS Tabs Take 1 tablet by mouth daily.   nitroGLYCERIN 0.4 MG SL tablet Commonly known as:  NITROSTAT Place 1 tablet (0.4 mg total) under the tongue every 5 (five) minutes as needed for chest pain.   ondansetron 8 MG tablet Commonly known as:  ZOFRAN TAKE 1 TABLET BY MOUTH EVERY 8 HOURS AS NEEDED FOR NAUSEA AND VOMITING   oxyCODONE 15 MG immediate release tablet Commonly known as:  ROXICODONE Take 1 tablet (15 mg total) by mouth every 4 (four) hours as needed for pain.   Pancrelipase (Lip-Prot-Amyl) 24000-76000 units Cpep Take 1 capsule (24,000 Units total) by mouth 3 (three) times daily before meals.   pantoprazole 40 MG tablet Commonly known as:  PROTONIX Take 1 tablet (40 mg  total) by mouth 2 (two) times daily.   polyethylene glycol packet Commonly known as:  MIRALAX / GLYCOLAX Take 17 g by mouth 2 (two) times daily.   potassium chloride SA 20 MEQ tablet Commonly known as:  K-DUR,KLOR-CON TAKE 2 TABLETS BY MOUTH TWICE DAILY   pregabalin 200 MG capsule Commonly known as:  LYRICA Take 1 capsule (200 mg total) by mouth 2 (two) times daily.   prochlorperazine 10 MG tablet Commonly known as:  COMPAZINE TAKE 1 TABLET BY MOUTH EVERY 6 HOURS AS NEEDED FOR NAUSEA AND VOMITING.   promethazine 25 MG tablet Commonly known as:  PHENERGAN TAKE 1 TABLET BY MOUTH THREE TIMES DAILY AS NEEDED FOR NAUSEA.   Suvorexant 20 MG Tabs Commonly known as:  BELSOMRA Take 20 mg by mouth at bedtime as needed. What changed:  reasons to take this   tiZANidine 4 MG capsule Commonly known as:  ZANAFLEX TAKE 1 CAPSULE BY MOUTH EVERY 8 HOURS.      Follow-up Information    [name], MD. Schedule an appointment as soon as possible for a visit in 1 week(s).   Specialty:  Family  Medicine Why:  post hospitalization visit. follow up of the potassium level.  Contact information: [place] [address] [phone]          Allergies  Allergen Reactions  . Influenza Vac Split [Flu Virus Vaccine] Other (See Comments)    Joint stiffness and renal failure  . Pneumococcal Vaccines Other (See Comments)    "sepsis and renal failure"  . Ritalin [Methylphenidate Hcl]     "Heart attack"  . Zostavax [Zoster Vaccine Live ([employer])] Other (See Comments)    Joint stiffness, renal failure  . Adhesive [Tape] Itching and Rash    Consultations:  Oncology.    Procedures/Studies: Dg Chest 1 View  Result Date: 02/02/2016 CLINICAL DATA:  Increasing weakness, with recent fall. Receiving chemotherapy for breast cancer. EXAM: CHEST 1 VIEW COMPARISON:  04/19/2015. FINDINGS: Cardiomegaly. Port-A-Cath tip RIGHT atrium. No active infiltrates or failure. LEFT basilar subsegmental atelectasis. No visible pulmonary nodules. No visible rib fracture. Similar appearance to priors. IMPRESSION: Unremarkable supine AP chest. Electronically Signed   By: Staci Righter M.D.   On: 02/02/2016 15:13   Dg Pelvis 1-2 Views  Result Date: 02/02/2016 CLINICAL DATA:  Pain following fall.  History of breast carcinoma EXAM: PELVIS - 1-2 VIEW COMPARISON:  None. FINDINGS: There is no evidence of pelvic fracture or dislocation. Joint spaces appear normal. There is dextroscoliosis in the lower lumbar spine. There are no blastic or lytic bone lesions. IMPRESSION: No acute fracture or dislocation. No evident blastic or lytic bone lesions. Hip joints appear symmetric bilaterally. Scoliosis in the lower lumbar spine region. Electronically Signed   By: Lowella Grip III M.D.   On: 02/02/2016 15:12   Ct Head Wo Contrast  Result Date: 02/02/2016 CLINICAL DATA:  60 y/o F; status post fall yesterday with head injury to the right parietal area with pain in the right  occipital and right-sided head. Posterior neck pain. EXAM: CT HEAD WITHOUT CONTRAST CT CERVICAL SPINE WITHOUT CONTRAST TECHNIQUE: Multidetector CT imaging of the head and cervical spine was performed following the standard protocol without intravenous contrast. Multiplanar CT image reconstructions of the cervical spine were also generated. COMPARISON:  04/19/2015 MRI of the brain. FINDINGS: CT HEAD FINDINGS Brain: No evidence of acute infarction, hemorrhage, hydrocephalus, extra-axial collection or mass lesion/mass effect. Mild chronic microvascular ischemic changes.  Vascular: No hyperdense vessel. Mild calcific atherosclerosis of carotid siphons. Skull: Choose Sinuses/Orbits: Mild maxillary sinus mucosal thickening. Otherwise visualized paranasal sinuses and mastoid air cells are normally aerated. Orbits are unremarkable. Other: None. CT CERVICAL SPINE FINDINGS Alignment: Straightening of cervical lordosis with reversal at C5. Degenerative grade 1 C4-5 anterolisthesis. Skull base and vertebrae: No acute fracture. No primary bone lesion or focal pathologic process. Soft tissues and spinal canal: No prevertebral fluid or swelling. No visible canal hematoma. Disc levels: Multilevel cervical spondylosis greatest at the C5 through C7 levels where there is extensive discogenic disease and small marginal osteophytes. There is mild to moderate bony foraminal narrowing at the C6-7 level. No high-grade bony canal stenosis. Upper chest: Negative. Other: Calcific atherosclerosis of bilateral carotid bifurcations. Right IJ central line partially visualized. Patent airway. IMPRESSION: 1. No acute intracranial abnormality is identified. 2. Mild chronic microvascular ischemic changes of the brain are stable. 3. No acute fracture or dislocation of the cervical spine. 4. Cervical spine degenerative changes greatest at C5 through C7 Electronically Signed   By: Kristine Garbe M.D.   On: 02/02/2016 15:07   Ct Chest W  Contrast  Result Date: 02/02/2016 CLINICAL DATA:  60 year old female with a history of metastatic breast cancer presents status post fall at home yesterday with head injury. EXAM: CT CHEST, ABDOMEN, AND PELVIS WITH CONTRAST TECHNIQUE: Multidetector CT imaging of the chest, abdomen and pelvis was performed following the standard protocol during bolus administration of intravenous contrast. CONTRAST:  152mL ISOVUE-300 IOPAMIDOL (ISOVUE-300) INJECTION 61% COMPARISON:  10/26/2015 PET-CT. 04/20/2015 chest CT. 04/19/2015 CT abdomen/ pelvis. FINDINGS: CT CHEST FINDINGS Motion degraded scan. Cardiovascular: Stable mild cardiomegaly. No significant pericardial fluid/thickening. Right internal jugular MediPort terminates at the cavoatrial junction. Atherosclerotic nonaneurysmal thoracic aorta. Normal caliber pulmonary arteries. No evidence of acute thoracic aortic injury. No central pulmonary emboli. Mediastinum/Nodes: No pneumomediastinum. No mediastinal hematoma. Simple fluid lateral to the ascending thoracic aorta, unchanged back to 04/20/2015, favored represent a pericardial cyst or fluid in a pericardial recess. Right hemithyroidectomy. No discrete left thyroid lobe nodules. Unremarkable esophagus. No axillary, mediastinal or hilar lymphadenopathy. Lungs/Pleura: No pneumothorax. Trace bilateral layering pleural effusions. Mild compressive atelectasis in the dependent lower lobes. No acute consolidative airspace disease, lung masses or significant pulmonary nodules. Musculoskeletal: No aggressive appearing focal osseous lesions. Acute minimally displaced lateral right eighth rib fracture. No additional fracture in the chest. Moderate thoracic spondylosis. Partially visualized intact appearing right breast prosthesis. CT ABDOMEN PELVIS FINDINGS Hepatobiliary: There are at least 10 scattered hypodense lesions throughout the liver, which appear increased in size and number since 10/26/2015. A 6.4 x 3.9 cm liver mass in the  medial segment left liver lobe (series 9/ image 42) is increased from 5.3 x 2.7 cm. A 4.1 x 2.2 cm liver dome mass (series 9/ image 32), is increased from 2.3 x 1.7 cm. No liver laceration. Cholecystectomy .Bile ducts are stable and within expected post cholecystectomy limits with common bile duct diameter 9 mm. No radiopaque choledocholithiasis. Pancreas: No pancreatic mass, duct dilation, laceration or peripancreatic fluid collections. Spleen: Normal size. No laceration or mass. Adrenals/Urinary Tract: Normal adrenals. Nonobstructing 5 mm lower left renal stone. No hydronephrosis. No renal laceration. No renal mass. Normal bladder. 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 Normal caliber small bowel with no small bowel wall thickening. Stable appearance of the appendix with no appendiceal wall thickening or significant periappendiceal fat stranding. Normal large bowel with  no diverticulosis, large bowel wall thickening or pericolonic fat stranding. Vascular/Lymphatic: Normal caliber abdominal aorta. Patent portal, splenic and renal veins. No pathologically enlarged lymph nodes in the abdomen or pelvis. Reproductive:  Grossly normal uterus.  No adnexal mass. Other: No pneumoperitoneum. Trace simple free fluid in the lower peritoneal cavity anteriorly. Nonspecific caking in the right omentum appears new. Small midline supraumbilical ventral abdominal wall hernia contains a small portion of the sigmoid colon, decreased in size. No associated colonic wall thickening or pneumatosis. Musculoskeletal: No aggressive appearing focal osseous lesions. No fracture in the abdomen or pelvis. Severe lumbar spondylosis, not appreciably changed back to 04/19/2015. IMPRESSION: 1. Minimally displaced acute lateral right eighth rib fracture. No pneumothorax . 2. Otherwise no acute traumatic injuries in the  chest, abdomen or pelvis. 3. Liver metastases have increased in size and number since 10/26/2015 PET-CT study . 4. New nonspecific caking in the right omentum, worrisome for peritoneal carcinomatosis. New simple fluid density small volume ascites in the anterior lower abdomen. 5. Additional findings include stable mild cardiomegaly, aortic atherosclerosis, trace layering bilateral pleural effusions and nonobstructing left renal stone. Electronically Signed   By: Ilona Sorrel M.D.   On: 02/02/2016 15:29   Ct Cervical Spine Wo Contrast  Result Date: 02/02/2016 CLINICAL DATA:  60 y/o F; status post fall yesterday with head injury to the right parietal area with pain in the right occipital and right-sided head. Posterior neck pain. EXAM: CT HEAD WITHOUT CONTRAST CT CERVICAL SPINE WITHOUT CONTRAST TECHNIQUE: Multidetector CT imaging of the head and cervical spine was performed following the standard protocol without intravenous contrast. Multiplanar CT image reconstructions of the cervical spine were also generated. COMPARISON:  04/19/2015 MRI of the brain. FINDINGS: CT HEAD FINDINGS Brain: No evidence of acute infarction, hemorrhage, hydrocephalus, extra-axial collection or mass lesion/mass effect. Mild chronic microvascular ischemic changes. Vascular: No hyperdense vessel. Mild calcific atherosclerosis of carotid siphons. Skull: Choose Sinuses/Orbits: Mild maxillary sinus mucosal thickening. Otherwise visualized paranasal sinuses and mastoid air cells are normally aerated. Orbits are unremarkable. Other: None. CT CERVICAL SPINE FINDINGS Alignment: Straightening of cervical lordosis with reversal at C5. Degenerative grade 1 C4-5 anterolisthesis. Skull base and vertebrae: No acute fracture. No primary bone lesion or focal pathologic process. Soft tissues and spinal canal: No prevertebral fluid or swelling. No visible canal hematoma. Disc levels: Multilevel cervical spondylosis greatest at the C5 through C7 levels  where there is extensive discogenic disease and small marginal osteophytes. There is mild to moderate bony foraminal narrowing at the C6-7 level. No high-grade bony canal stenosis. Upper chest: Negative. Other: Calcific atherosclerosis of bilateral carotid bifurcations. Right IJ central line partially visualized. Patent airway. IMPRESSION: 1. No acute intracranial abnormality is identified. 2. Mild chronic microvascular ischemic changes of the brain are stable. 3. No acute fracture or dislocation of the cervical spine. 4. Cervical spine degenerative changes greatest at C5 through C7 Electronically Signed   By: Kristine Garbe M.D.   On: 02/02/2016 15:07   Mr Jeri Cos [id number] Contrast  Result Date: 02/06/2016 CLINICAL DATA:  History of breast cancer. Evaluation for leptomeningeal disease. EXAM: MRI HEAD WITHOUT AND WITH CONTRAST TECHNIQUE: Multiplanar, multiecho pulse sequences of the brain and surrounding structures were obtained without and with intravenous contrast. CONTRAST:  86mL MULTIHANCE GADOBENATE DIMEGLUMINE 529 MG/ML IV SOLN COMPARISON:  Head CT 02/02/2016 FINDINGS: Brain: No acute infarct or intraparenchymal hemorrhage. The midline structures are normal. There is multifocal hyperintense T2-weighted signal within the periventricular and deep white matter, most often seen  in the setting of chronic microvascular ischemia. No mass lesion or midline shift. No hydrocephalus or extra-axial fluid collection. No meningeal lesions. Vascular: Major intracranial arterial and venous sinus flow voids are preserved. Extensive susceptibility on the gradient echo sequence along course of the intracranial vessels is compatible with recent iron infusion. Skull and upper cervical spine: The visualized skull base, calvarium, upper cervical spine and extracranial soft tissues are normal. Sinuses/Orbits: No fluid levels or advanced mucosal thickening. No mastoid effusion. Normal orbits. IMPRESSION: 1. No intracranial  parenchymal or meningeal metastatic disease. 2. Findings of chronic microvascular ischemia without acute intracranial abnormality. Electronically Signed   By: Ulyses Jarred M.D.   On: 02/06/2016 22:14   Mr Cervical Spine W Wo Contrast  Result Date: 02/09/2016 CLINICAL DATA:  Personal history of metastatic breast cancer. Progressive weakness over the last 3 weeks. EXAM: MRI CERVICAL SPINE WITHOUT AND WITH CONTRAST TECHNIQUE: Multiplanar and multiecho pulse sequences of the cervical spine, to include the craniocervical junction and cervicothoracic junction, were obtained according to standard protocol without and with intravenous contrast. CONTRAST:  82mL MULTIHANCE GADOBENATE DIMEGLUMINE 529 MG/ML IV SOLN COMPARISON:  CT 02/02/2016 FINDINGS: Foramen magnum is widely patent. Foramen magnum and C1-2 are unremarkable. C2-3:  Normal. C3-4: Normal. C4-5: Minimal endplate osteophytes.  No canal or foraminal stenosis. C5-6: Chronic fusion. Osteophytes narrow the ventral subarachnoid space but do not compress the cord. No foraminal stenosis. C6-7:  Mild bulging of the disc.  No canal or foraminal stenosis. C7-T1:  Normal interspace. No evidence of osseous metastatic disease in the region. No evidence of cord lesion. IMPRESSION: No significant finding seen in the cervical region. No central canal stenosis. No cord lesion. No significant foraminal narrowing. Chronic fusion at C5-6. Mild spondylosis at C6-7. Electronically Signed   By: Nelson Chimes M.D.   On: 02/09/2016 09:29   Mr Thoracic Spine W Wo Contrast  Result Date: 02/03/2016 CLINICAL DATA:  Metastatic breast cancer, status post fall yesterday. Progressive weakness, unable to ambulate. EXAM: MRI THORACIC AND LUMBAR SPINE WITHOUT AND WITH CONTRAST TECHNIQUE: Multiplanar and multiecho pulse sequences of the thoracic and lumbar spine were obtained without and with intravenous contrast. CONTRAST:  69mL MULTIHANCE GADOBENATE DIMEGLUMINE 529 MG/ML IV SOLN COMPARISON:   04/19/2015 MR lumbar spine and thoracic spine, CT chest 04/20/2015, PET-CT 10/26/2015, CT chest/ abdomen/pelvis 02/02/2016 FINDINGS: MRI THORACIC SPINE FINDINGS Alignment:  No static listhesis.  Mild kyphosis centered at T7-8. Vertebrae: No fracture, evidence of discitis, or bone lesion. Cord:  Normal signal and morphology. Paraspinal and other soft tissues: No paraspinal soft tissue abnormality. Bilateral small pleural effusions. Possible small right pericardial cyst. Disc levels: Disc spaces: Mild degenerative disc disease with minimal disc height loss at T6-7 and T7-8. On the sagittal scout images there is degenerative disc disease at C5-6 and to a lesser extent C6-7. T1-T2: Minimal broad-based disc bulge. No foraminal or central canal stenosis. T2-T3: No disc protrusion, foraminal stenosis or central canal stenosis. T3-T4: Small left paracentral disc protrusion. No foraminal or central canal stenosis. T4-T5: No disc protrusion, foraminal stenosis or central canal stenosis. T5-T6: No disc protrusion, foraminal stenosis or central canal stenosis. T6-T7: Small right paracentral disc protrusion contacting the thoracic spinal cord. No foraminal or central canal stenosis. T7-T8: Small right paracentral disc protrusion. No foraminal or central canal stenosis. T8-T9: No disc protrusion, foraminal stenosis or central canal stenosis. T9-T10: No disc protrusion, foraminal stenosis or central canal stenosis. T10-T11: No disc protrusion, foraminal stenosis or central canal stenosis. T11-T12: No disc  protrusion, foraminal stenosis or central canal stenosis. MRI LUMBAR SPINE FINDINGS Segmentation:  Standard. Alignment: Minimal grade 1 retrolisthesis of L1 on L2 secondary to degenerative changes. Vertebrae: No acute fracture or aggressive bone lesion. Fluid in the L2-3 disc with mild endplate enhancement, but without significant disc enhancement. No significant endplate reactive marrow edema. Endplate irregularity is noted.  Small right SI joint effusion. Conus medullaris: Extends to the T12-L1 level and appears normal. Paraspinal and other soft tissues: No paraspinal soft tissue abnormality. Disc levels: Disc spaces: Degenerative disc disease with severe disc height loss at L1-2. Degenerative disc disease with disc height loss at L3-4 and L4-5. T12-L1: Mild broad-based disc bulge. No evidence of neural foraminal stenosis. No central canal stenosis. L1-L2: No significant disc protrusion. Mild bilateral facet arthropathy. No evidence of neural foraminal stenosis. No central canal stenosis. L2-L3: Broad disc bulge. Moderate-severe bilateral facet arthropathy with moderate central canal stenosis and bilateral lateral recess stenosis. Mild right and moderate left foraminal stenosis. L3-L4: Mild broad-based disc bulge. Severe bilateral facet arthropathy. Mild spinal stenosis and bilateral lateral recess stenosis. Severe right foraminal stenosis. No left foraminal stenosis. L4-L5: Mild broad-based disc bulge eccentric towards the right abutting the right extra foraminal L4 nerve root. Severe right and mild left facet arthropathy. Right lateral recess stenosis. Severe right foraminal stenosis. L5-S1: No significant disc bulge. No evidence of neural foraminal stenosis. No central canal stenosis. Moderate bilateral facet arthropathy, right worse than left. IMPRESSION: THORACIC SPINE: 1. No acute osseous injury of the thoracic spine. 2. No osseous metastatic disease of the thoracic spine. 3. Bilateral small pleural effusions. LUMBAR SPINE: 1. No acute injury of the lumbar spine. 2. No osseous metastatic disease of the lumbar spine. 3. Diffuse lumbar spine spondylosis as described above. 4. Severe L2-3 disc edema and mild endplate enhancement with severe bilateral facet arthropathy likely relate to severe inflammatory changes given that there is no significant interval change compared with 04/19/2015 nor is there a paravertebral fluid collection  or epidural fluid collection. Discitis-osteomyelitis is considered most less likely. 5. At L2-3 there is moderate-severe bilateral facet arthropathy with moderate central canal stenosis and bilateral lateral recess stenosis. Mild right and moderate left foraminal stenosis. 6. At L4-5 there is a mild broad-based disc bulge eccentric towards the right abutting the right extra foraminal L4 nerve root. Severe right and mild left facet arthropathy. Right lateral recess stenosis. Severe right foraminal stenosis. 7. At L3-4 there is a broad-based disc bulge. Severe bilateral facet arthropathy. Mild spinal stenosis and bilateral lateral recess stenosis. Severe right foraminal stenosis. Electronically Signed   By: Kathreen Devoid   On: 02/03/2016 08:36   Mr Lumbar Spine W Wo Contrast  Result Date: 02/03/2016 CLINICAL DATA:  Metastatic breast cancer, status post fall yesterday. Progressive weakness, unable to ambulate. EXAM: MRI THORACIC AND LUMBAR SPINE WITHOUT AND WITH CONTRAST TECHNIQUE: Multiplanar and multiecho pulse sequences of the thoracic and lumbar spine were obtained without and with intravenous contrast. CONTRAST:  82mL MULTIHANCE GADOBENATE DIMEGLUMINE 529 MG/ML IV SOLN COMPARISON:  04/19/2015 MR lumbar spine and thoracic spine, CT chest 04/20/2015, PET-CT 10/26/2015, CT chest/ abdomen/pelvis 02/02/2016 FINDINGS: MRI THORACIC SPINE FINDINGS Alignment:  No static listhesis.  Mild kyphosis centered at T7-8. Vertebrae: No fracture, evidence of discitis, or bone lesion. Cord:  Normal signal and morphology. Paraspinal and other soft tissues: No paraspinal soft tissue abnormality. Bilateral small pleural effusions. Possible small right pericardial cyst. Disc levels: Disc spaces: Mild degenerative disc disease with minimal disc height loss at  T6-7 and T7-8. On the sagittal scout images there is degenerative disc disease at C5-6 and to a lesser extent C6-7. T1-T2: Minimal broad-based disc bulge. No foraminal or central  canal stenosis. T2-T3: No disc protrusion, foraminal stenosis or central canal stenosis. T3-T4: Small left paracentral disc protrusion. No foraminal or central canal stenosis. T4-T5: No disc protrusion, foraminal stenosis or central canal stenosis. T5-T6: No disc protrusion, foraminal stenosis or central canal stenosis. T6-T7: Small right paracentral disc protrusion contacting the thoracic spinal cord. No foraminal or central canal stenosis. T7-T8: Small right paracentral disc protrusion. No foraminal or central canal stenosis. T8-T9: No disc protrusion, foraminal stenosis or central canal stenosis. T9-T10: No disc protrusion, foraminal stenosis or central canal stenosis. T10-T11: No disc protrusion, foraminal stenosis or central canal stenosis. T11-T12: No disc protrusion, foraminal stenosis or central canal stenosis. MRI LUMBAR SPINE FINDINGS Segmentation:  Standard. Alignment: Minimal grade 1 retrolisthesis of L1 on L2 secondary to degenerative changes. Vertebrae: No acute fracture or aggressive bone lesion. Fluid in the L2-3 disc with mild endplate enhancement, but without significant disc enhancement. No significant endplate reactive marrow edema. Endplate irregularity is noted. Small right SI joint effusion. Conus medullaris: Extends to the T12-L1 level and appears normal. Paraspinal and other soft tissues: No paraspinal soft tissue abnormality. Disc levels: Disc spaces: Degenerative disc disease with severe disc height loss at L1-2. Degenerative disc disease with disc height loss at L3-4 and L4-5. T12-L1: Mild broad-based disc bulge. No evidence of neural foraminal stenosis. No central canal stenosis. L1-L2: No significant disc protrusion. Mild bilateral facet arthropathy. No evidence of neural foraminal stenosis. No central canal stenosis. L2-L3: Broad disc bulge. Moderate-severe bilateral facet arthropathy with moderate central canal stenosis and bilateral lateral recess stenosis. Mild right and moderate  left foraminal stenosis. L3-L4: Mild broad-based disc bulge. Severe bilateral facet arthropathy. Mild spinal stenosis and bilateral lateral recess stenosis. Severe right foraminal stenosis. No left foraminal stenosis. L4-L5: Mild broad-based disc bulge eccentric towards the right abutting the right extra foraminal L4 nerve root. Severe right and mild left facet arthropathy. Right lateral recess stenosis. Severe right foraminal stenosis. L5-S1: No significant disc bulge. No evidence of neural foraminal stenosis. No central canal stenosis. Moderate bilateral facet arthropathy, right worse than left. IMPRESSION: THORACIC SPINE: 1. No acute osseous injury of the thoracic spine. 2. No osseous metastatic disease of the thoracic spine. 3. Bilateral small pleural effusions. LUMBAR SPINE: 1. No acute injury of the lumbar spine. 2. No osseous metastatic disease of the lumbar spine. 3. Diffuse lumbar spine spondylosis as described above. 4. Severe L2-3 disc edema and mild endplate enhancement with severe bilateral facet arthropathy likely relate to severe inflammatory changes given that there is no significant interval change compared with 04/19/2015 nor is there a paravertebral fluid collection or epidural fluid collection. Discitis-osteomyelitis is considered most less likely. 5. At L2-3 there is moderate-severe bilateral facet arthropathy with moderate central canal stenosis and bilateral lateral recess stenosis. Mild right and moderate left foraminal stenosis. 6. At L4-5 there is a mild broad-based disc bulge eccentric towards the right abutting the right extra foraminal L4 nerve root. Severe right and mild left facet arthropathy. Right lateral recess stenosis. Severe right foraminal stenosis. 7. At L3-4 there is a broad-based disc bulge. Severe bilateral facet arthropathy. Mild spinal stenosis and bilateral lateral recess stenosis. Severe right foraminal stenosis. Electronically Signed   By: Kathreen Devoid   On: 02/03/2016  08:36   Ct Abdomen Pelvis W Contrast  Result Date: 02/02/2016  CLINICAL DATA:  60 year old female with a history of metastatic breast cancer presents status post fall at home yesterday with head injury. EXAM: CT CHEST, ABDOMEN, AND PELVIS WITH CONTRAST TECHNIQUE: Multidetector CT imaging of the chest, abdomen and pelvis was performed following the standard protocol during bolus administration of intravenous contrast. CONTRAST:  156mL ISOVUE-300 IOPAMIDOL (ISOVUE-300) INJECTION 61% COMPARISON:  10/26/2015 PET-CT. 04/20/2015 chest CT. 04/19/2015 CT abdomen/ pelvis. FINDINGS: CT CHEST FINDINGS Motion degraded scan. Cardiovascular: Stable mild cardiomegaly. No significant pericardial fluid/thickening. Right internal jugular MediPort terminates at the cavoatrial junction. Atherosclerotic nonaneurysmal thoracic aorta. Normal caliber pulmonary arteries. No evidence of acute thoracic aortic injury. No central pulmonary emboli. Mediastinum/Nodes: No pneumomediastinum. No mediastinal hematoma. Simple fluid lateral to the ascending thoracic aorta, unchanged back to 04/20/2015, favored represent a pericardial cyst or fluid in a pericardial recess. Right hemithyroidectomy. No discrete left thyroid lobe nodules. Unremarkable esophagus. No axillary, mediastinal or hilar lymphadenopathy. Lungs/Pleura: No pneumothorax. Trace bilateral layering pleural effusions. Mild compressive atelectasis in the dependent lower lobes. No acute consolidative airspace disease, lung masses or significant pulmonary nodules. Musculoskeletal: No aggressive appearing focal osseous lesions. Acute minimally displaced lateral right eighth rib fracture. No additional fracture in the chest. Moderate thoracic spondylosis. Partially visualized intact appearing right breast prosthesis. CT ABDOMEN PELVIS FINDINGS Hepatobiliary: There are at least 10 scattered hypodense lesions throughout the liver, which appear increased in size and number since 10/26/2015. A  6.4 x 3.9 cm liver mass in the medial segment left liver lobe (series 9/ image 42) is increased from 5.3 x 2.7 cm. A 4.1 x 2.2 cm liver dome mass (series 9/ image 32), is increased from 2.3 x 1.7 cm. No liver laceration. Cholecystectomy .Bile ducts are stable and within expected post cholecystectomy limits with common bile duct diameter 9 mm. No radiopaque choledocholithiasis. Pancreas: No pancreatic mass, duct dilation, laceration or peripancreatic fluid collections. Spleen: Normal size. No laceration or mass. Adrenals/Urinary Tract: Normal adrenals. Nonobstructing 5 mm lower left renal stone. No hydronephrosis. No renal laceration. No renal mass. Normal bladder. 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 Normal caliber small bowel with no small bowel wall thickening. Stable appearance of the appendix with no appendiceal wall thickening or significant periappendiceal fat stranding. Normal large bowel with no diverticulosis, large bowel wall thickening or pericolonic fat stranding. Vascular/Lymphatic: Normal caliber abdominal aorta. Patent portal, splenic and renal veins. No pathologically enlarged lymph nodes in the abdomen or pelvis. Reproductive:  Grossly normal uterus.  No adnexal mass. Other: No pneumoperitoneum. Trace simple free fluid in the lower peritoneal cavity anteriorly. Nonspecific caking in the right omentum appears new. Small midline supraumbilical ventral abdominal wall hernia contains a small portion of the sigmoid colon, decreased in size. No associated colonic wall thickening or pneumatosis. Musculoskeletal: No aggressive appearing focal osseous lesions. No fracture in the abdomen or pelvis. Severe lumbar spondylosis, not appreciably changed back to 04/19/2015. IMPRESSION: 1. Minimally displaced acute lateral right eighth rib fracture. No pneumothorax . 2. Otherwise no  acute traumatic injuries in the chest, abdomen or pelvis. 3. Liver metastases have increased in size and number since 10/26/2015 PET-CT study . 4. New nonspecific caking in the right omentum, worrisome for peritoneal carcinomatosis. New simple fluid density small volume ascites in the anterior lower abdomen. 5. Additional findings include stable mild cardiomegaly, aortic atherosclerosis, trace layering bilateral pleural effusions and nonobstructing left renal stone. Electronically Signed   By: Janina Mayo.D.  On: 02/02/2016 15:29   Dg Knee Left Port  Result Date: 02/08/2016 CLINICAL DATA:  Fall with progressive weakness. EXAM: PORTABLE LEFT KNEE - 1-2 VIEW COMPARISON:  11/30/2010 FINDINGS: Deformity within the proximal fibular shaft compatible with old healed fracture. Mild degenerative changes in the left knee, best seen in the patellofemoral compartment with joint space narrowing and spurring. Small joint effusion. No acute fracture, subluxation or dislocation. IMPRESSION: Mild degenerative changes. Small joint effusion. No acute bony abnormality. Electronically Signed   By: Rolm Baptise M.D.   On: 02/08/2016 11:32   Dg Knee Right Port  Result Date: 02/08/2016 CLINICAL DATA:  Fall, progressive weakness. EXAM: PORTABLE RIGHT KNEE - 1-2 VIEW COMPARISON:  11/30/2010 FINDINGS: Moderate tricompartment degenerative changes within the right knee with joint space narrowing and spurring. Small joint effusion. No acute bony abnormality. Specifically, no fracture, subluxation, or dislocation. Soft tissues are intact. IMPRESSION: Moderate tricompartment degenerative changes with joint effusion. No acute bony abnormality. Electronically Signed   By: Rolm Baptise M.D.   On: 02/08/2016 11:33   Mr Attempted Daymon Larsen Report  Result Date: 02/08/2016 This examination belongs to an outside facility and is stored here for comparison purposes only.  Contact the originating outside institution for any associated  report or interpretation.      Subjective:  No new complaints.   Discharge Exam: Vitals:   02/08/16 2133 02/09/16 0500  BP: (!) 128/95 90/64  Pulse: 94 78  Resp: 16 14  Temp: 98.1 F (36.7 C) 97.9 F (36.6 C)   Vitals:   02/08/16 0517 02/08/16 1412 02/08/16 2133 02/09/16 0500  BP: 128/75 118/75 (!) 128/95 90/64  Pulse: 87 (!) 102 94 78  Resp: 16 18 16 14   Temp: 98.8 F (37.1 C) 98.7 F (37.1 C) 98.1 F (36.7 C) 97.9 F (36.6 C)  TempSrc: Oral Oral Oral Oral  SpO2: 95% 94% 96% 92%  Weight:      Height:        General: Pt is alert, awake, not in acute distress Cardiovascular: RRR, S1/S2 +, no rubs, no gallops Respiratory: CTA bilaterally, no wheezing, no rhonchi Abdominal: Soft, NT, ND, bowel sounds +    The results of significant diagnostics from this hospitalization (including imaging, microbiology, ancillary and laboratory) are listed below for reference.     Microbiology: Recent Results (from the past 240 hour(s))  MRSA PCR Screening     Status: Abnormal   Collection Time: 02/03/16 10:26 AM  Result Value Ref Range Status   MRSA by PCR POSITIVE (A) NEGATIVE Final    Comment:        The GeneXpert MRSA Assay (FDA approved for NASAL specimens only), is one component of a comprehensive MRSA colonization surveillance program. It is not intended to diagnose MRSA infection nor to guide or monitor treatment for MRSA infections. RESULT CALLED TO, READ BACK BY AND VERIFIED WITH: HUGHEY,C @ [id number] ON [id number] BY POTEAT,S   Culture, blood (routine x 2)     Status: None   Collection Time: 02/03/16  3:42 PM  Result Value Ref Range Status   Specimen Description BLOOD BLOOD LEFT HAND  Final   Special Requests IN PEDIATRIC BOTTLE 1 CC  Final   Culture   Final    NO GROWTH 5 DAYS Performed at [facility]    Report Status 02/08/2016 FINAL  Final  Culture, blood (routine x 2)     Status: None   Collection Time: 02/03/16  3:42 PM  Result Value Ref Range  Status   Specimen Description BLOOD BLOOD LEFT HAND  Final   Special Requests IN PEDIATRIC BOTTLE 1 CC  Final   Culture   Final    NO GROWTH 5 DAYS Performed at [facility] [place]    Report Status 02/08/2016 FINAL  Final  Urine culture     Status: Abnormal   Collection Time: 02/06/16  7:52 PM  Result Value Ref Range Status   Specimen Description URINE, CLEAN CATCH  Final   Special Requests NONE  Final   Culture MULTIPLE SPECIES PRESENT, SUGGEST RECOLLECTION (A)  Final   Report Status 02/08/2016 FINAL  Final     Labs: BNP (last 3 results) No results for input(s): BNP in the last 8760 hours. Basic Metabolic Panel:  Recent Labs Lab 02/05/16 0603 02/06/16 0400 02/07/16 0425 02/08/16 0545 02/09/16 0500  NA 141 143 143 144 143  K 3.6 4.0 3.8 3.5 3.2*  CL 113* 114* 115* 112* 110  CO2 24 25 25 27 28   GLUCOSE 117* 114* 94 81 98  BUN 31* 32* 28* 26* 24*  CREATININE 0.96 1.15* 0.83 0.85 0.80  CALCIUM 8.0* 8.0* 7.8* 8.1* 7.9*  MG 2.0  --   --  1.8  --    Liver Function Tests:  Recent Labs Lab 02/02/16 1321 02/03/16 0450 02/07/16 0425  AST 15 15 18   ALT 29 24 22   ALKPHOS 44 38 40  BILITOT 1.0 0.8 0.5  PROT 5.5* 4.5* 4.6*  ALBUMIN 3.2* 2.6* 2.3*   No results for input(s): LIPASE, AMYLASE in the last 168 hours. No results for input(s): AMMONIA in the last 168 hours. CBC:  Recent Labs Lab 02/02/16 1321  02/04/16 0600 02/05/16 0603 02/06/16 0400 02/07/16 0425 02/08/16 0545 02/09/16 0500  WBC 5.3  < > 5.1 5.4 4.9 4.3 5.7 5.2  NEUTROABS 4.6  --  4.0  --   --   --   --   --   HGB 9.8*  < > 9.7* 9.4* 8.5* 7.6* 10.6* 10.0*  HCT 29.2*  < > 30.1* 28.1* 26.2* 23.6* 32.2* 30.0*  MCV 100.3*  < > 107.1* 102.2* 106.9* 107.3* 100.6* 101.4*  PLT 68*  < > 64* 76* 78* 84* 88* 104*  < > = values in this interval not displayed. Cardiac Enzymes:  Recent Labs Lab 02/04/16 0600  CKTOTAL 37*   BNP: Invalid input(s): POCBNP CBG: No results for input(s): GLUCAP in the last  168 hours. D-Dimer No results for input(s): DDIMER in the last 72 hours. Hgb A1c No results for input(s): HGBA1C in the last 72 hours. Lipid Profile No results for input(s): CHOL, HDL, LDLCALC, TRIG, CHOLHDL, LDLDIRECT in the last 72 hours. Thyroid function studies No results for input(s): TSH, T4TOTAL, T3FREE, THYROIDAB in the last 72 hours.  Invalid input(s): FREET3 Anemia work up No results for input(s): VITAMINB12, FOLATE, FERRITIN, TIBC, IRON, RETICCTPCT in the last 72 hours. Urinalysis    Component Value Date/Time   COLORURINE AMBER (A) 02/06/2016 1953   APPEARANCEUR CLEAR 02/06/2016 1953   LABSPEC 1.028 02/06/2016 1953   PHURINE 6.0 02/06/2016 1953   GLUCOSEU NEGATIVE 02/06/2016 1953   HGBUR NEGATIVE 02/06/2016 1953   BILIRUBINUR SMALL (A) 02/06/2016 1953   BILIRUBINUR negative 05/24/2011 1630   KETONESUR NEGATIVE 02/06/2016 1953   PROTEINUR NEGATIVE 02/06/2016 1953   UROBILINOGEN 0.2 08/26/2014 1850   NITRITE NEGATIVE 02/06/2016 1953   LEUKOCYTESUR SMALL (A) 02/06/2016 1953   Sepsis Labs Invalid input(s): PROCALCITONIN,  WBC,  LACTICIDVEN Microbiology Recent  Results (from the past 240 hour(s))  MRSA PCR Screening     Status: Abnormal   Collection Time: 02/03/16 10:26 AM  Result Value Ref Range Status   MRSA by PCR POSITIVE (A) NEGATIVE Final    Comment:        The GeneXpert MRSA Assay (FDA approved for NASAL specimens only), is one component of a comprehensive MRSA colonization surveillance program. It is not intended to diagnose MRSA infection nor to guide or monitor treatment for MRSA infections. RESULT CALLED TO, READ BACK BY AND VERIFIED WITH: HUGHEY,C @ [id number] ON [id number] BY POTEAT,S   Culture, blood (routine x 2)     Status: None   Collection Time: 02/03/16  3:42 PM  Result Value Ref Range Status   Specimen Description BLOOD BLOOD LEFT HAND  Final   Special Requests IN PEDIATRIC BOTTLE 1 CC  Final   Culture   Final    NO GROWTH 5 DAYS Performed at  [facility]    Report Status 02/08/2016 FINAL  Final  Culture, blood (routine x 2)     Status: None   Collection Time: 02/03/16  3:42 PM  Result Value Ref Range Status   Specimen Description BLOOD BLOOD LEFT HAND  Final   Special Requests IN PEDIATRIC BOTTLE 1 CC  Final   Culture   Final    NO GROWTH 5 DAYS Performed at [facility]    Report Status 02/08/2016 FINAL  Final  Urine culture     Status: Abnormal   Collection Time: 02/06/16  7:52 PM  Result Value Ref Range Status   Specimen Description URINE, CLEAN CATCH  Final   Special Requests NONE  Final   Culture MULTIPLE SPECIES PRESENT, SUGGEST RECOLLECTION (A)  Final   Report Status 02/08/2016 FINAL  Final     Time coordinating discharge: Over 30 minutes  SIGNED:   Hosie Poisson, MD  [facility] 02/09/2016, 9:52 AM Pager   If 7PM-7AM, please contact night-coverage [email or web address] Password [email or web address]

## 2016-02-09 NOTE — Progress Notes (Signed)
Patient suffers from Progressive metastatic breast cancer which impairs their ability to perform daily activities in the home. A walking aid: will not resolve  issue with performing activities of daily living. A wheelchair will allow patient to safely perform daily activities. Patient can safely propel the wheelchair in the home or has a caregiver who can provide assistance.  Accessories: elevating leg rests (ELRs), wheel locks, extensions and anti-tippers.

## 2016-02-09 NOTE — Progress Notes (Signed)
Per [facility] DME rep hospital bed and hoyar lift is to be delivered to her home this afternoon. Daughter [name] confirms this as well. Wheelchair is being delivered as well, but may be on a later delivery. Pt needing ambulance transport home per daughter. Medical necessity form filled out and printed along with face sheet. PTAR number left with staff RN to call when daughter reports bed has been delivered. No other CM needs communicated. Marney Doctor RN,BSN,NCM [phone]

## 2016-02-09 NOTE — Progress Notes (Signed)
[name] is somewhat lethargic this morning. She does awaken with gentle arousal. She says that she got back late from the MRI. She says she got back at 12:30 in the morning. The results of the cervical MRI are not back yet.  She does not appear to be in any obvious pain. She did not eat much yesterday. Looks that she may have a little bit of thrush. I will put her on some Diflucan for this.  She still is not moving her legs all that well. Much or how much physical therapy has maybe to help with her.  She clearly needs a ton of help at home. I hope that her daughter will be able to help her out.  Her last CA 27.29 was further elevated. This obviously is indicative of her metastatic breast cancer. I still do not think that metastatic breast cancer is involved with this leg weakness. It will be interesting to see what the cervical MRI shows.  Her labs look pretty stable. Her potassium is down a little bit. I will give her some IV potassium. Her white cell count is good. Her platelet is now back up to 104,000. Hemoglobin is 10. Hopefully, this is a sign that the Xeloda is finally out of her system. It is possible that she may be one of these "slow metabolizes" that take quite a while to get Xeloda out of the system.  On her physical exam, her temperature is 97.9. Pulse 78. Blood pressure 90/64. Her lungs are slightly decreased at the bases. Oral exam does show some thrush in the pharynx. She has no adenopathy in the neck. Cardiac exam is regular rate and rhythm. There are no murmurs. Abdomen is obese but soft. There is no guarding or rebound tenderness. Extremities shows some chronic nonpitting edema in the lower legs. She has lymphedema of her right arm. Skin exam shows no rashes, ecchymoses or petechia. Neurological exam shows the symmetric leg weakness.  Maybe, improved her potassium will help her legs. I give her some IV potassium.  I still worry about her being cared for at home. I know her  daughter will try to help as much as possible. I will have to see her within a week I think to see how she is doing. As far as therapy for metastatic breast cancer, this will be dependent upon her overall performance status.  I appreciate all the gray care that she is gotten. Everybody on 3 W.has done a fantastic job.  Lattie Haw, MD

## 2016-02-09 NOTE — Progress Notes (Signed)
Occupational Therapy Treatment Patient Details Name: Annjeanette Pehl MRN: [id number] DOB: November 01, 1955 Today's Date: 02/09/2016    History of present illness 60yo F adm With metastatic breast cancer ,currently off therapy, reported progressive weakness, has been in bed for the last three weeks, she fell out of bed yesterday and hit her abdomen and sustained nondisplaced right eighth rib fracture, she presented to the ED 02/02/16 due to increased pain.   OT comments  Patient limited by lethargy/fatigue during session. Progressing very slowly towards OT goals. Continue OT per plan of care.  Follow Up Recommendations  Supervision/Assistance - 24 hour;Home health OT;Other (comment) [facility] aide)    [facility] bed;Other (comment) (hoyer lift)    Recommendations for Other Services      Precautions / Restrictions Precautions Precautions: Fall Precaution Comments: multiple falls, slide of and fell off       Mobility Bed Mobility               General bed mobility comments: HOB raised for feeding task  Transfers                 General transfer comment: unable    Balance                                   ADL Overall ADL's : Needs assistance/impaired Eating/Feeding: Minimal assistance;Bed level                                     General ADL Comments: Patient sleeping upon arrival; aroused to voice, breakfast tray untouched on tray table. Set patient up for self-feeding task. HOB raised, tray table placed in position, all containers opened, food cut up by OT. Patient initially had difficulty manipulating utensils (slipping out of her hand) but this improved over time. Min spillage of drink with first sip. OT observed pt successfully take 5 bites of food and 2 sips of drink. Left pt to continue feeding herself breakfast.       Vision                     Perception     Praxis      Cognition    Behavior During Therapy: Flat affect Overall Cognitive Status: Within Functional Limits for tasks assessed                       Extremity/Trunk Assessment               Exercises     Shoulder Instructions       General Comments      Pertinent Vitals/ Pain       Pain Assessment: Faces Faces Pain Scale: Hurts a little bit Pain Location: ribs, back Pain Descriptors / Indicators: Grimacing Pain Intervention(s): Limited activity within patient's tolerance;Monitored during session  Home Living                                          Prior Functioning/Environment              Frequency  Min 2X/week        Progress Toward Goals  OT Goals(current goals can now be found in the  care plan section)  Progress towards OT goals: Not progressing toward goals - comment (limited session)  Acute Rehab OT Goals Patient Stated Goal: get stronger  Plan Discharge plan remains appropriate    Co-evaluation                 End of Session     Activity Tolerance Patient limited by fatigue;Patient limited by lethargy   Patient Left in bed;with call bell/phone within reach;with bed alarm set   Nurse Communication          Time: 0812-0822 OT Time Calculation (min): 10 min  Charges: OT General Charges $OT Visit: 1 Procedure OT Treatments $Self Care/Home Management : 8-22 mins  Emelyn Roen A 02/09/2016, 10:33 AM

## 2016-02-14 DIAGNOSIS — S2231XD Fracture of one rib, right side, subsequent encounter for fracture with routine healing: Secondary | ICD-10-CM | POA: Diagnosis not present

## 2016-02-14 DIAGNOSIS — L89510 Pressure ulcer of right ankle, unstageable: Secondary | ICD-10-CM | POA: Diagnosis not present

## 2016-02-14 DIAGNOSIS — C50911 Malignant neoplasm of unspecified site of right female breast: Secondary | ICD-10-CM | POA: Diagnosis not present

## 2016-02-14 DIAGNOSIS — W06XXXD Fall from bed, subsequent encounter: Secondary | ICD-10-CM | POA: Diagnosis not present

## 2016-02-14 DIAGNOSIS — I509 Heart failure, unspecified: Secondary | ICD-10-CM | POA: Diagnosis not present

## 2016-02-14 DIAGNOSIS — Z452 Encounter for adjustment and management of vascular access device: Secondary | ICD-10-CM | POA: Diagnosis not present

## 2016-02-14 DIAGNOSIS — E119 Type 2 diabetes mellitus without complications: Secondary | ICD-10-CM | POA: Diagnosis not present

## 2016-02-14 DIAGNOSIS — Z6841 Body Mass Index (BMI) 40.0 and over, adult: Secondary | ICD-10-CM | POA: Diagnosis not present

## 2016-02-14 DIAGNOSIS — M549 Dorsalgia, unspecified: Secondary | ICD-10-CM | POA: Diagnosis not present

## 2016-02-14 DIAGNOSIS — I110 Hypertensive heart disease with heart failure: Secondary | ICD-10-CM | POA: Diagnosis not present

## 2016-02-14 DIAGNOSIS — C73 Malignant neoplasm of thyroid gland: Secondary | ICD-10-CM | POA: Diagnosis not present

## 2016-02-14 DIAGNOSIS — L89154 Pressure ulcer of sacral region, stage 4: Secondary | ICD-10-CM | POA: Diagnosis not present

## 2016-02-14 DIAGNOSIS — M159 Polyosteoarthritis, unspecified: Secondary | ICD-10-CM | POA: Diagnosis not present

## 2016-02-14 DIAGNOSIS — Z4800 Encounter for change or removal of nonsurgical wound dressing: Secondary | ICD-10-CM | POA: Diagnosis not present

## 2016-02-14 DIAGNOSIS — L89520 Pressure ulcer of left ankle, unstageable: Secondary | ICD-10-CM | POA: Diagnosis not present

## 2016-02-14 DIAGNOSIS — E039 Hypothyroidism, unspecified: Secondary | ICD-10-CM | POA: Diagnosis not present

## 2016-02-14 DIAGNOSIS — Z9013 Acquired absence of bilateral breasts and nipples: Secondary | ICD-10-CM | POA: Diagnosis not present

## 2016-02-14 DIAGNOSIS — G894 Chronic pain syndrome: Secondary | ICD-10-CM | POA: Diagnosis not present

## 2016-02-14 DIAGNOSIS — C229 Malignant neoplasm of liver, not specified as primary or secondary: Secondary | ICD-10-CM | POA: Diagnosis not present

## 2016-02-14 DIAGNOSIS — M797 Fibromyalgia: Secondary | ICD-10-CM | POA: Diagnosis not present

## 2016-02-14 DIAGNOSIS — E669 Obesity, unspecified: Secondary | ICD-10-CM | POA: Diagnosis not present

## 2016-02-15 ENCOUNTER — Telehealth: Payer: Self-pay | Admitting: *Deleted

## 2016-02-15 ENCOUNTER — Telehealth: Payer: Self-pay | Admitting: Emergency Medicine

## 2016-02-15 DIAGNOSIS — L89510 Pressure ulcer of right ankle, unstageable: Secondary | ICD-10-CM | POA: Diagnosis not present

## 2016-02-15 DIAGNOSIS — S2231XD Fracture of one rib, right side, subsequent encounter for fracture with routine healing: Secondary | ICD-10-CM | POA: Diagnosis not present

## 2016-02-15 DIAGNOSIS — L89520 Pressure ulcer of left ankle, unstageable: Secondary | ICD-10-CM | POA: Diagnosis not present

## 2016-02-15 DIAGNOSIS — L89154 Pressure ulcer of sacral region, stage 4: Secondary | ICD-10-CM | POA: Diagnosis not present

## 2016-02-15 DIAGNOSIS — C73 Malignant neoplasm of thyroid gland: Secondary | ICD-10-CM | POA: Diagnosis not present

## 2016-02-15 DIAGNOSIS — C50911 Malignant neoplasm of unspecified site of right female breast: Secondary | ICD-10-CM | POA: Diagnosis not present

## 2016-02-15 NOTE — Telephone Encounter (Signed)
Patient's daughter called requesting refills for her mother's Oxycodone 15mg  tablets and the Fentanyl 48mcg patches. Patient was recently discharged from the hospital and she was given a prescription for Fentanyl 4mcg patches which they haven't filled. States that want all narcotic prescriptions to be filled by Dr Marin Olp due to her pain clinic contract. Advised patinet's daughter that we would call her when prescriptions are ready for pick up.

## 2016-02-15 NOTE — Telephone Encounter (Signed)
[facility] RN updating office with patient admission into home health.  RN states that patient has three wounds. Stage 4 sacral wound, stage 3 ankle wound and an addition unstagable wound. She has made a consult to the wound management nurse.   Patient and daughter have turned down offering of home PT/OT, Social Work. Cheyrrl is concerned that the daughter will be unable to manage the patient at home but at this time the daughter is sure of her ability and not accepting of further help.  Patient will have labs drawn per orders via the PAC.  Patient is requesting that her pain management regimen be returned to her pre-hospitalization plan.   Dr Marin Olp is aware of all the above. Regarding pain management, he doesn't want to change anything at this time due to patients blood pressure and previous levels of sedation on old regimen. Cheyrrl RN is aware of this decision.

## 2016-02-16 ENCOUNTER — Other Ambulatory Visit: Payer: Self-pay | Admitting: *Deleted

## 2016-02-16 DIAGNOSIS — D510 Vitamin B12 deficiency anemia due to intrinsic factor deficiency: Secondary | ICD-10-CM

## 2016-02-16 DIAGNOSIS — C787 Secondary malignant neoplasm of liver and intrahepatic bile duct: Secondary | ICD-10-CM

## 2016-02-16 DIAGNOSIS — D500 Iron deficiency anemia secondary to blood loss (chronic): Secondary | ICD-10-CM

## 2016-02-16 DIAGNOSIS — C50911 Malignant neoplasm of unspecified site of right female breast: Secondary | ICD-10-CM

## 2016-02-16 DIAGNOSIS — C50919 Malignant neoplasm of unspecified site of unspecified female breast: Secondary | ICD-10-CM

## 2016-02-16 DIAGNOSIS — C50912 Malignant neoplasm of unspecified site of left female breast: Secondary | ICD-10-CM

## 2016-02-16 DIAGNOSIS — I150 Renovascular hypertension: Secondary | ICD-10-CM

## 2016-02-16 DIAGNOSIS — G4701 Insomnia due to medical condition: Secondary | ICD-10-CM

## 2016-02-16 MED ORDER — FENTANYL 25 MCG/HR TD PT72: 25.0000 ug | MEDICATED_PATCH | TRANSDERMAL | 0 refills | Status: DC

## 2016-02-16 MED ORDER — FENTANYL 50 MCG/HR TD PT72: 50.0000 ug | MEDICATED_PATCH | TRANSDERMAL | 0 refills | Status: DC

## 2016-02-16 MED ORDER — OXYCODONE HCL 15 MG PO TABS: 15.0000 mg | ORAL_TABLET | ORAL | 0 refills | Status: DC | PRN

## 2016-02-17 ENCOUNTER — Other Ambulatory Visit: Payer: Self-pay

## 2016-02-17 DIAGNOSIS — D649 Anemia, unspecified: Secondary | ICD-10-CM | POA: Diagnosis not present

## 2016-02-17 DIAGNOSIS — C73 Malignant neoplasm of thyroid gland: Secondary | ICD-10-CM | POA: Diagnosis not present

## 2016-02-17 DIAGNOSIS — L89154 Pressure ulcer of sacral region, stage 4: Secondary | ICD-10-CM | POA: Diagnosis not present

## 2016-02-17 DIAGNOSIS — E876 Hypokalemia: Secondary | ICD-10-CM | POA: Diagnosis not present

## 2016-02-17 DIAGNOSIS — S2231XD Fracture of one rib, right side, subsequent encounter for fracture with routine healing: Secondary | ICD-10-CM | POA: Diagnosis not present

## 2016-02-17 DIAGNOSIS — C229 Malignant neoplasm of liver, not specified as primary or secondary: Secondary | ICD-10-CM | POA: Diagnosis not present

## 2016-02-17 DIAGNOSIS — C50911 Malignant neoplasm of unspecified site of right female breast: Secondary | ICD-10-CM | POA: Diagnosis not present

## 2016-02-17 DIAGNOSIS — M797 Fibromyalgia: Secondary | ICD-10-CM | POA: Diagnosis not present

## 2016-02-17 DIAGNOSIS — E119 Type 2 diabetes mellitus without complications: Secondary | ICD-10-CM | POA: Diagnosis not present

## 2016-02-17 DIAGNOSIS — L89510 Pressure ulcer of right ankle, unstageable: Secondary | ICD-10-CM | POA: Diagnosis not present

## 2016-02-17 DIAGNOSIS — L89520 Pressure ulcer of left ankle, unstageable: Secondary | ICD-10-CM | POA: Diagnosis not present

## 2016-02-17 MED ORDER — FENTANYL 25 MCG/HR TD PT72: 25.0000 ug | MEDICATED_PATCH | TRANSDERMAL | 0 refills | Status: DC

## 2016-02-17 MED ORDER — FENTANYL 50 MCG/HR TD PT72: 50.0000 ug | MEDICATED_PATCH | TRANSDERMAL | 0 refills | Status: DC

## 2016-02-17 MED FILL — fentaNYL 25 MCG/HR PT72: 25 | 30 days supply | Qty: 15 | Fill #0

## 2016-02-17 MED FILL — oxyCODONE HCL 15 MG TABS: 15 | 30 days supply | Qty: 180 | Fill #0

## 2016-02-17 MED FILL — fentaNYL 50 MCG/HR PT72: 50 | 30 days supply | Qty: 15 | Fill #0

## 2016-02-18 ENCOUNTER — Encounter: Payer: Self-pay | Admitting: Hematology & Oncology

## 2016-02-21 ENCOUNTER — Other Ambulatory Visit: Payer: Self-pay | Admitting: Hematology & Oncology

## 2016-02-22 DIAGNOSIS — S2231XD Fracture of one rib, right side, subsequent encounter for fracture with routine healing: Secondary | ICD-10-CM | POA: Diagnosis not present

## 2016-02-22 DIAGNOSIS — L89154 Pressure ulcer of sacral region, stage 4: Secondary | ICD-10-CM | POA: Diagnosis not present

## 2016-02-22 DIAGNOSIS — C50911 Malignant neoplasm of unspecified site of right female breast: Secondary | ICD-10-CM | POA: Diagnosis not present

## 2016-02-22 DIAGNOSIS — L89520 Pressure ulcer of left ankle, unstageable: Secondary | ICD-10-CM | POA: Diagnosis not present

## 2016-02-22 DIAGNOSIS — C73 Malignant neoplasm of thyroid gland: Secondary | ICD-10-CM | POA: Diagnosis not present

## 2016-02-22 DIAGNOSIS — L89510 Pressure ulcer of right ankle, unstageable: Secondary | ICD-10-CM | POA: Diagnosis not present

## 2016-02-24 ENCOUNTER — Telehealth: Payer: Self-pay

## 2016-02-24 DIAGNOSIS — L89510 Pressure ulcer of right ankle, unstageable: Secondary | ICD-10-CM | POA: Diagnosis not present

## 2016-02-24 DIAGNOSIS — C50911 Malignant neoplasm of unspecified site of right female breast: Secondary | ICD-10-CM | POA: Diagnosis not present

## 2016-02-24 DIAGNOSIS — S2231XD Fracture of one rib, right side, subsequent encounter for fracture with routine healing: Secondary | ICD-10-CM | POA: Diagnosis not present

## 2016-02-24 DIAGNOSIS — C73 Malignant neoplasm of thyroid gland: Secondary | ICD-10-CM | POA: Diagnosis not present

## 2016-02-24 DIAGNOSIS — N390 Urinary tract infection, site not specified: Secondary | ICD-10-CM | POA: Diagnosis not present

## 2016-02-24 DIAGNOSIS — L89154 Pressure ulcer of sacral region, stage 4: Secondary | ICD-10-CM | POA: Diagnosis not present

## 2016-02-24 DIAGNOSIS — L89520 Pressure ulcer of left ankle, unstageable: Secondary | ICD-10-CM | POA: Diagnosis not present

## 2016-02-24 NOTE — Telephone Encounter (Signed)
Received call from Amy, RN with [facility] with updated recommendations from the wound nurse related to pt's sacral wound and bilat ankle wounds.   Per Amy, wound RN recommends using Anasept gel to sacral wound and MediHoney Alginate to ankles. [name], daughter has been educated on wound care and dressing changes and has demonstrated understanding.  Per Dr Marin Olp, he agrees with wound care recommendations by [facility]. dph

## 2016-02-25 DIAGNOSIS — C50911 Malignant neoplasm of unspecified site of right female breast: Secondary | ICD-10-CM | POA: Diagnosis not present

## 2016-02-25 DIAGNOSIS — L89510 Pressure ulcer of right ankle, unstageable: Secondary | ICD-10-CM | POA: Diagnosis not present

## 2016-02-25 DIAGNOSIS — S2231XD Fracture of one rib, right side, subsequent encounter for fracture with routine healing: Secondary | ICD-10-CM | POA: Diagnosis not present

## 2016-02-25 DIAGNOSIS — L89154 Pressure ulcer of sacral region, stage 4: Secondary | ICD-10-CM | POA: Diagnosis not present

## 2016-02-25 DIAGNOSIS — C73 Malignant neoplasm of thyroid gland: Secondary | ICD-10-CM | POA: Diagnosis not present

## 2016-02-25 DIAGNOSIS — L89520 Pressure ulcer of left ankle, unstageable: Secondary | ICD-10-CM | POA: Diagnosis not present

## 2016-02-29 ENCOUNTER — Encounter: Payer: Self-pay | Admitting: Hematology & Oncology

## 2016-02-29 ENCOUNTER — Other Ambulatory Visit: Payer: Self-pay

## 2016-03-01 ENCOUNTER — Encounter: Payer: Self-pay | Admitting: Hematology & Oncology

## 2016-03-03 DIAGNOSIS — C73 Malignant neoplasm of thyroid gland: Secondary | ICD-10-CM | POA: Diagnosis not present

## 2016-03-03 DIAGNOSIS — S2231XD Fracture of one rib, right side, subsequent encounter for fracture with routine healing: Secondary | ICD-10-CM | POA: Diagnosis not present

## 2016-03-03 DIAGNOSIS — L89520 Pressure ulcer of left ankle, unstageable: Secondary | ICD-10-CM | POA: Diagnosis not present

## 2016-03-03 DIAGNOSIS — L89510 Pressure ulcer of right ankle, unstageable: Secondary | ICD-10-CM | POA: Diagnosis not present

## 2016-03-03 DIAGNOSIS — L89154 Pressure ulcer of sacral region, stage 4: Secondary | ICD-10-CM | POA: Diagnosis not present

## 2016-03-03 DIAGNOSIS — C50911 Malignant neoplasm of unspecified site of right female breast: Secondary | ICD-10-CM | POA: Diagnosis not present

## 2016-03-04 IMAGING — CR DG CHEST 1V PORT
1 series · 1 of 1 positions shown · non-contrast
Comparison: 07/11/2014

CLINICAL DATA: Central line placement

EXAM:
PORTABLE CHEST - 1 VIEW

[AP]
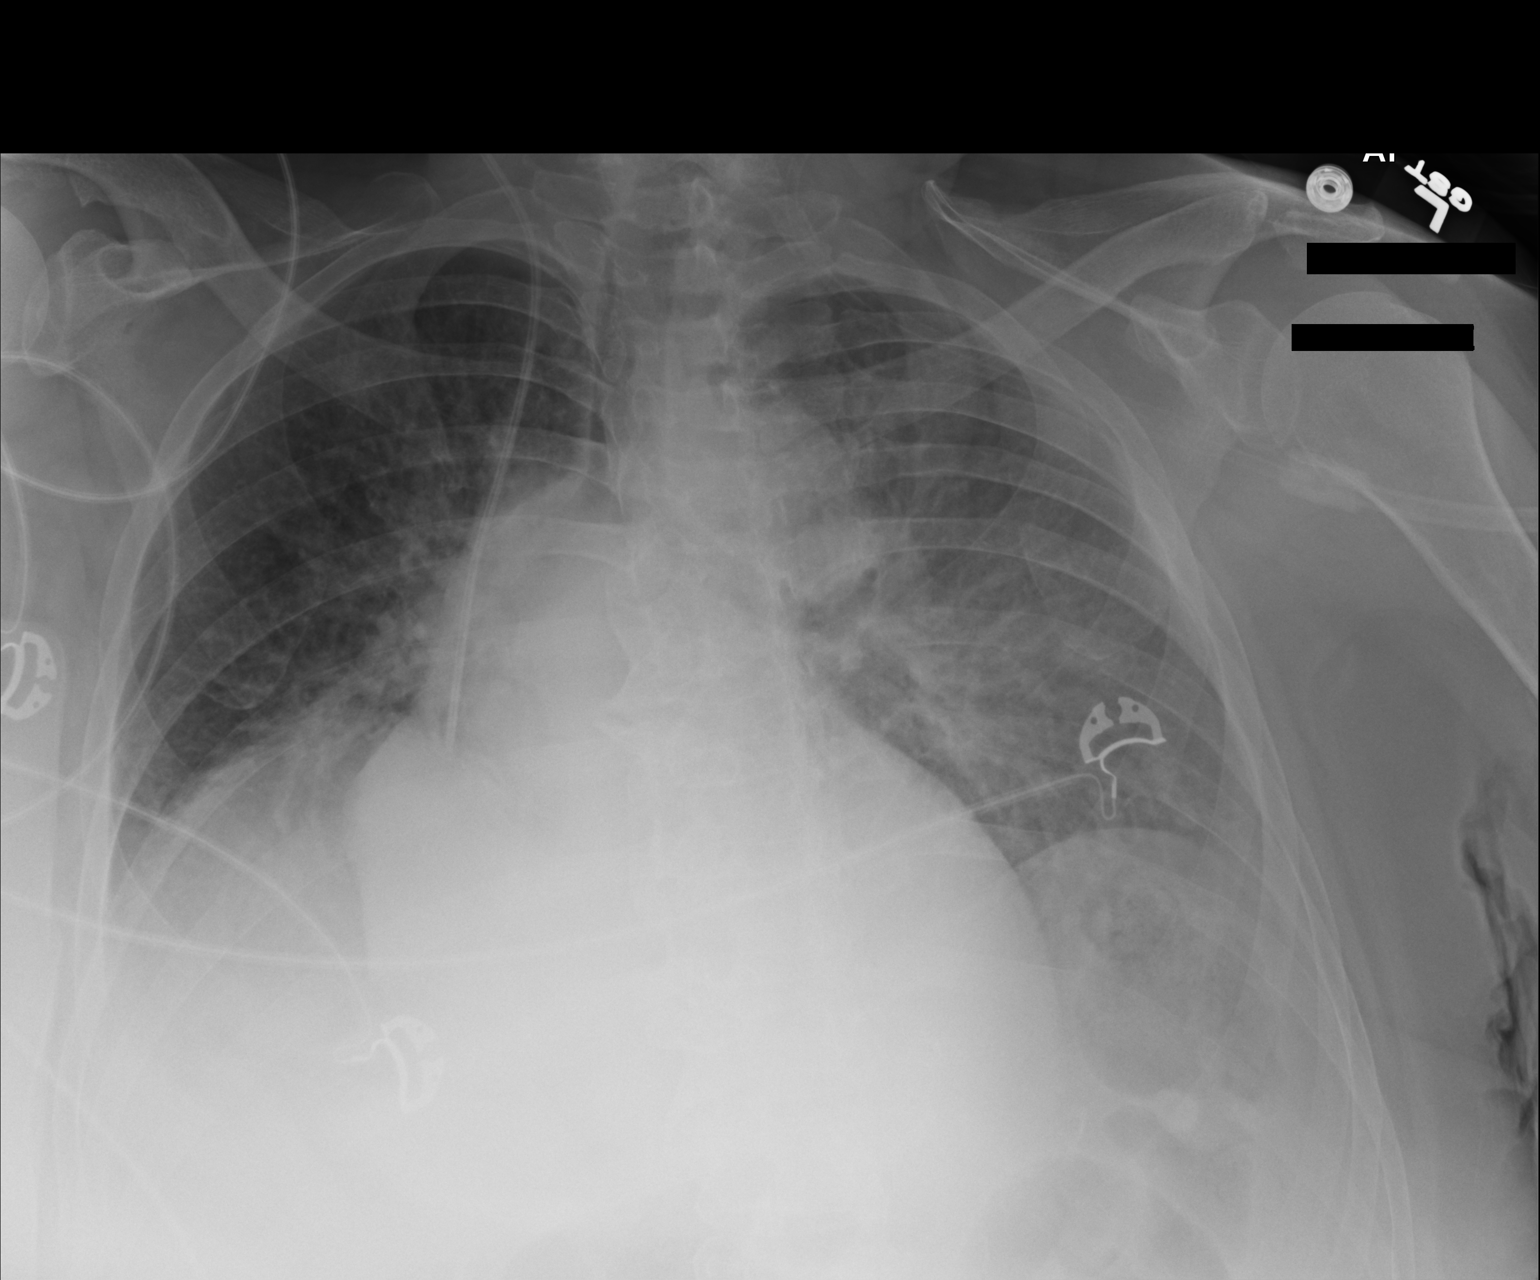

[1 of 1 positions shown; findings below may reference images not displayed]

FINDINGS: There is a new right jugular central line with tip in the expected
location of the low SVC. There is no pneumothorax. Opacities persist
throughout both lungs, somewhat more confluent in the central lung
regions compared to 07/11/2014.
IMPRESSION: New right-sided central line. No pneumothorax. Worsening airspace
opacities.

## 2016-03-09 ENCOUNTER — Other Ambulatory Visit: Payer: Self-pay | Admitting: *Deleted

## 2016-03-09 ENCOUNTER — Other Ambulatory Visit: Payer: Self-pay | Admitting: Hematology & Oncology

## 2016-03-09 DIAGNOSIS — C50911 Malignant neoplasm of unspecified site of right female breast: Secondary | ICD-10-CM

## 2016-03-09 DIAGNOSIS — C787 Secondary malignant neoplasm of liver and intrahepatic bile duct: Secondary | ICD-10-CM

## 2016-03-09 DIAGNOSIS — I150 Renovascular hypertension: Secondary | ICD-10-CM

## 2016-03-09 DIAGNOSIS — D500 Iron deficiency anemia secondary to blood loss (chronic): Secondary | ICD-10-CM

## 2016-03-09 DIAGNOSIS — G4701 Insomnia due to medical condition: Secondary | ICD-10-CM

## 2016-03-09 DIAGNOSIS — C50919 Malignant neoplasm of unspecified site of unspecified female breast: Secondary | ICD-10-CM

## 2016-03-09 DIAGNOSIS — C50912 Malignant neoplasm of unspecified site of left female breast: Secondary | ICD-10-CM

## 2016-03-09 DIAGNOSIS — D510 Vitamin B12 deficiency anemia due to intrinsic factor deficiency: Secondary | ICD-10-CM

## 2016-03-09 MED ORDER — OXYCODONE HCL 20 MG PO TABS: 20.0000 mg | ORAL_TABLET | ORAL | 0 refills | Status: DC | PRN

## 2016-03-09 MED ORDER — FENTANYL 25 MCG/HR TD PT72: 25.0000 ug | MEDICATED_PATCH | TRANSDERMAL | 0 refills | Status: DC

## 2016-03-09 MED ORDER — FENTANYL 100 MCG/HR TD PT72: 100.0000 ug | MEDICATED_PATCH | TRANSDERMAL | 0 refills | Status: DC

## 2016-03-09 NOTE — Telephone Encounter (Signed)
Patient's daughter is stating that her mother is in extreme pain. She has, on her own, increased the fentanyl patches to 123mcg. She states this is holding patient better. She wants Dr Marin Olp to provide a new prescription. She is also asking for patient's OXY IR to be increased to 20mg  from the current 15mg .  Spoke to Dr Marin Olp and he is fine with both these increases. Patient is currently home bound with multiple pressure wounds.

## 2016-03-10 ENCOUNTER — Other Ambulatory Visit: Payer: Self-pay | Admitting: Hematology & Oncology

## 2016-03-10 MED FILL — oxyCODONE HCL 20 MG TABS: 20 | 30 days supply | Qty: 180 | Fill #0

## 2016-03-10 MED FILL — fentaNYL 100 MCG/HR PT72: 100 | 30 days supply | Qty: 10 | Fill #0

## 2016-03-14 ENCOUNTER — Encounter (HOSPITAL_COMMUNITY): Payer: Self-pay | Admitting: Family Medicine

## 2016-03-14 ENCOUNTER — Inpatient Hospital Stay (HOSPITAL_COMMUNITY)
Admission: EM | Admit: 2016-03-14 | Discharge: 2016-03-20 | DRG: 853 | Disposition: A | Discharge status: Skilled Nursing Facility | Payer: Medicare Other | Attending: Internal Medicine | Admitting: Internal Medicine

## 2016-03-14 ENCOUNTER — Inpatient Hospital Stay (HOSPITAL_COMMUNITY): Payer: Medicare Other

## 2016-03-14 ENCOUNTER — Emergency Department (HOSPITAL_COMMUNITY): Payer: Medicare Other

## 2016-03-14 DIAGNOSIS — G8929 Other chronic pain: Secondary | ICD-10-CM | POA: Diagnosis present

## 2016-03-14 DIAGNOSIS — M797 Fibromyalgia: Secondary | ICD-10-CM | POA: Diagnosis present

## 2016-03-14 DIAGNOSIS — E872 Acidosis: Secondary | ICD-10-CM | POA: Diagnosis present

## 2016-03-14 DIAGNOSIS — I5032 Chronic diastolic (congestive) heart failure: Secondary | ICD-10-CM | POA: Diagnosis present

## 2016-03-14 DIAGNOSIS — R000 Tachycardia, unspecified: Secondary | ICD-10-CM | POA: Diagnosis not present

## 2016-03-14 DIAGNOSIS — C786 Secondary malignant neoplasm of retroperitoneum and peritoneum: Secondary | ICD-10-CM | POA: Diagnosis not present

## 2016-03-14 DIAGNOSIS — D259 Leiomyoma of uterus, unspecified: Secondary | ICD-10-CM | POA: Diagnosis not present

## 2016-03-14 DIAGNOSIS — K5900 Constipation, unspecified: Secondary | ICD-10-CM | POA: Diagnosis present

## 2016-03-14 DIAGNOSIS — C50919 Malignant neoplasm of unspecified site of unspecified female breast: Secondary | ICD-10-CM

## 2016-03-14 DIAGNOSIS — J189 Pneumonia, unspecified organism: Secondary | ICD-10-CM | POA: Diagnosis present

## 2016-03-14 DIAGNOSIS — Z7401 Bed confinement status: Secondary | ICD-10-CM | POA: Diagnosis not present

## 2016-03-14 DIAGNOSIS — C73 Malignant neoplasm of thyroid gland: Secondary | ICD-10-CM | POA: Diagnosis not present

## 2016-03-14 DIAGNOSIS — C787 Secondary malignant neoplasm of liver and intrahepatic bile duct: Secondary | ICD-10-CM | POA: Diagnosis present

## 2016-03-14 DIAGNOSIS — R0902 Hypoxemia: Secondary | ICD-10-CM | POA: Diagnosis present

## 2016-03-14 DIAGNOSIS — L89154 Pressure ulcer of sacral region, stage 4: Secondary | ICD-10-CM | POA: Diagnosis present

## 2016-03-14 DIAGNOSIS — C801 Malignant (primary) neoplasm, unspecified: Secondary | ICD-10-CM | POA: Diagnosis not present

## 2016-03-14 DIAGNOSIS — Z6841 Body Mass Index (BMI) 40.0 and over, adult: Secondary | ICD-10-CM

## 2016-03-14 DIAGNOSIS — G9340 Encephalopathy, unspecified: Secondary | ICD-10-CM | POA: Diagnosis not present

## 2016-03-14 DIAGNOSIS — F411 Generalized anxiety disorder: Secondary | ICD-10-CM | POA: Diagnosis not present

## 2016-03-14 DIAGNOSIS — M419 Scoliosis, unspecified: Secondary | ICD-10-CM | POA: Diagnosis present

## 2016-03-14 DIAGNOSIS — Z809 Family history of malignant neoplasm, unspecified: Secondary | ICD-10-CM

## 2016-03-14 DIAGNOSIS — D500 Iron deficiency anemia secondary to blood loss (chronic): Secondary | ICD-10-CM

## 2016-03-14 DIAGNOSIS — L89153 Pressure ulcer of sacral region, stage 3: Secondary | ICD-10-CM | POA: Diagnosis not present

## 2016-03-14 DIAGNOSIS — S2231XD Fracture of one rib, right side, subsequent encounter for fracture with routine healing: Secondary | ICD-10-CM | POA: Diagnosis not present

## 2016-03-14 DIAGNOSIS — I252 Old myocardial infarction: Secondary | ICD-10-CM

## 2016-03-14 DIAGNOSIS — F329 Major depressive disorder, single episode, unspecified: Secondary | ICD-10-CM | POA: Diagnosis present

## 2016-03-14 DIAGNOSIS — J9600 Acute respiratory failure, unspecified whether with hypoxia or hypercapnia: Secondary | ICD-10-CM | POA: Diagnosis not present

## 2016-03-14 DIAGNOSIS — Z79891 Long term (current) use of opiate analgesic: Secondary | ICD-10-CM

## 2016-03-14 DIAGNOSIS — R52 Pain, unspecified: Secondary | ICD-10-CM | POA: Diagnosis not present

## 2016-03-14 DIAGNOSIS — I110 Hypertensive heart disease with heart failure: Secondary | ICD-10-CM | POA: Diagnosis present

## 2016-03-14 DIAGNOSIS — Z9013 Acquired absence of bilateral breasts and nipples: Secondary | ICD-10-CM

## 2016-03-14 DIAGNOSIS — L89159 Pressure ulcer of sacral region, unspecified stage: Secondary | ICD-10-CM | POA: Diagnosis not present

## 2016-03-14 DIAGNOSIS — J9601 Acute respiratory failure with hypoxia: Secondary | ICD-10-CM

## 2016-03-14 DIAGNOSIS — Z9884 Bariatric surgery status: Secondary | ICD-10-CM

## 2016-03-14 DIAGNOSIS — A419 Sepsis, unspecified organism: Secondary | ICD-10-CM | POA: Diagnosis not present

## 2016-03-14 DIAGNOSIS — I5031 Acute diastolic (congestive) heart failure: Secondary | ICD-10-CM | POA: Diagnosis not present

## 2016-03-14 DIAGNOSIS — C50911 Malignant neoplasm of unspecified site of right female breast: Secondary | ICD-10-CM | POA: Diagnosis not present

## 2016-03-14 DIAGNOSIS — E119 Type 2 diabetes mellitus without complications: Secondary | ICD-10-CM | POA: Diagnosis present

## 2016-03-14 DIAGNOSIS — G4730 Sleep apnea, unspecified: Secondary | ICD-10-CM | POA: Diagnosis present

## 2016-03-14 DIAGNOSIS — L89150 Pressure ulcer of sacral region, unstageable: Secondary | ICD-10-CM | POA: Diagnosis not present

## 2016-03-14 DIAGNOSIS — K219 Gastro-esophageal reflux disease without esophagitis: Secondary | ICD-10-CM | POA: Diagnosis present

## 2016-03-14 DIAGNOSIS — D649 Anemia, unspecified: Secondary | ICD-10-CM | POA: Diagnosis not present

## 2016-03-14 DIAGNOSIS — D510 Vitamin B12 deficiency anemia due to intrinsic factor deficiency: Secondary | ICD-10-CM

## 2016-03-14 DIAGNOSIS — I509 Heart failure, unspecified: Secondary | ICD-10-CM | POA: Diagnosis not present

## 2016-03-14 DIAGNOSIS — R791 Abnormal coagulation profile: Secondary | ICD-10-CM | POA: Diagnosis present

## 2016-03-14 DIAGNOSIS — G4701 Insomnia due to medical condition: Secondary | ICD-10-CM

## 2016-03-14 DIAGNOSIS — Z9119 Patient's noncompliance with other medical treatment and regimen: Secondary | ICD-10-CM

## 2016-03-14 DIAGNOSIS — A4189 Other specified sepsis: Secondary | ICD-10-CM | POA: Diagnosis not present

## 2016-03-14 DIAGNOSIS — I959 Hypotension, unspecified: Secondary | ICD-10-CM | POA: Diagnosis not present

## 2016-03-14 DIAGNOSIS — I5181 Takotsubo syndrome: Secondary | ICD-10-CM | POA: Diagnosis present

## 2016-03-14 DIAGNOSIS — Z853 Personal history of malignant neoplasm of breast: Secondary | ICD-10-CM

## 2016-03-14 DIAGNOSIS — C50912 Malignant neoplasm of unspecified site of left female breast: Secondary | ICD-10-CM

## 2016-03-14 DIAGNOSIS — I150 Renovascular hypertension: Secondary | ICD-10-CM

## 2016-03-14 DIAGNOSIS — R0602 Shortness of breath: Secondary | ICD-10-CM

## 2016-03-14 DIAGNOSIS — Z8249 Family history of ischemic heart disease and other diseases of the circulatory system: Secondary | ICD-10-CM

## 2016-03-14 DIAGNOSIS — Z79899 Other long term (current) drug therapy: Secondary | ICD-10-CM

## 2016-03-14 DIAGNOSIS — F339 Major depressive disorder, recurrent, unspecified: Secondary | ICD-10-CM | POA: Diagnosis not present

## 2016-03-14 DIAGNOSIS — J188 Other pneumonia, unspecified organism: Secondary | ICD-10-CM | POA: Diagnosis not present

## 2016-03-14 DIAGNOSIS — E039 Hypothyroidism, unspecified: Secondary | ICD-10-CM | POA: Diagnosis present

## 2016-03-14 DIAGNOSIS — R627 Adult failure to thrive: Secondary | ICD-10-CM | POA: Diagnosis present

## 2016-03-14 DIAGNOSIS — L89520 Pressure ulcer of left ankle, unstageable: Secondary | ICD-10-CM | POA: Diagnosis not present

## 2016-03-14 DIAGNOSIS — R6520 Severe sepsis without septic shock: Secondary | ICD-10-CM | POA: Diagnosis present

## 2016-03-14 DIAGNOSIS — J9621 Acute and chronic respiratory failure with hypoxia: Secondary | ICD-10-CM | POA: Diagnosis not present

## 2016-03-14 DIAGNOSIS — Z85850 Personal history of malignant neoplasm of thyroid: Secondary | ICD-10-CM

## 2016-03-14 DIAGNOSIS — L89510 Pressure ulcer of right ankle, unstageable: Secondary | ICD-10-CM | POA: Diagnosis not present

## 2016-03-14 DIAGNOSIS — Z9049 Acquired absence of other specified parts of digestive tract: Secondary | ICD-10-CM

## 2016-03-14 DIAGNOSIS — I5033 Acute on chronic diastolic (congestive) heart failure: Secondary | ICD-10-CM | POA: Diagnosis not present

## 2016-03-14 DIAGNOSIS — M069 Rheumatoid arthritis, unspecified: Secondary | ICD-10-CM | POA: Diagnosis not present

## 2016-03-14 DIAGNOSIS — R069 Unspecified abnormalities of breathing: Secondary | ICD-10-CM | POA: Diagnosis not present

## 2016-03-14 DIAGNOSIS — R531 Weakness: Secondary | ICD-10-CM | POA: Diagnosis not present

## 2016-03-14 LAB — COMPREHENSIVE METABOLIC PANEL
ALBUMIN: 3.1 g/dL — AB (ref 3.5–5.0)
ALT: 22 U/L (ref 14–54)
ANION GAP: 9 (ref 5–15)
AST: 23 U/L (ref 15–41)
Alkaline Phosphatase: 80 U/L (ref 38–126)
BILIRUBIN TOTAL: 1.2 mg/dL (ref 0.3–1.2)
BUN: 36 mg/dL — ABNORMAL HIGH (ref 6–20)
CO2: 23 mmol/L (ref 22–32)
Calcium: 9 mg/dL (ref 8.9–10.3)
Chloride: 115 mmol/L — ABNORMAL HIGH (ref 101–111)
Creatinine, Ser: 0.81 mg/dL (ref 0.44–1.00)
GFR calc Af Amer: >60 mL/min (ref >60)
GLUCOSE: 122 mg/dL — AB (ref 65–99)
POTASSIUM: 3.5 mmol/L (ref 3.5–5.1)
Sodium: 147 mmol/L — ABNORMAL HIGH (ref 135–145)
TOTAL PROTEIN: 6.7 g/dL (ref 6.5–8.1)

## 2016-03-14 LAB — CBC WITH DIFFERENTIAL/PLATELET
BASOS ABS: 0 10*3/uL (ref 0.0–0.1)
Basophils Relative: 0 %
Eosinophils Absolute: 0.2 10*3/uL (ref 0.0–0.7)
Eosinophils Relative: 1 %
HEMATOCRIT: 38.1 % (ref 36.0–46.0)
Hemoglobin: 12.5 g/dL (ref 12.0–15.0)
LYMPHS PCT: 6 %
Lymphs Abs: 0.7 10*3/uL (ref 0.7–4.0)
MCH: 32.8 pg (ref 26.0–34.0)
MCHC: 32.8 g/dL (ref 30.0–36.0)
MCV: 100 fL (ref 78.0–100.0)
Monocytes Absolute: 0.5 10*3/uL (ref 0.1–1.0)
Monocytes Relative: 4 %
NEUTROS ABS: 11.9 10*3/uL — AB (ref 1.7–7.7)
Neutrophils Relative %: 89 %
Platelets: 149 10*3/uL — ABNORMAL LOW (ref 150–400)
RBC: 3.81 MIL/uL — AB (ref 3.87–5.11)
RDW: 16 % — ABNORMAL HIGH (ref 11.5–15.5)
WBC: 13.4 10*3/uL — AB (ref 4.0–10.5)

## 2016-03-14 LAB — BLOOD GAS, ARTERIAL
ACID-BASE DEFICIT: 1.7 mmol/L (ref 0.0–2.0)
BICARBONATE: 21.6 mmol/L (ref 20.0–28.0)
Delivery systems: POSITIVE
Drawn by: 295031
EXPIRATORY PAP: 7
FIO2: 60
INSPIRATORY PAP: 14
Mode: POSITIVE
O2 SAT: 97.8 %
PATIENT TEMPERATURE: 98.6
PH ART: 7.425 (ref 7.350–7.450)
PO2 ART: 155 mmHg — AB (ref 83.0–108.0)
pCO2 arterial: 33.5 mmHg (ref 32.0–48.0)

## 2016-03-14 LAB — I-STAT TROPONIN, ED: Troponin i, poc: 0 ng/mL (ref 0.00–0.08)

## 2016-03-14 LAB — D-DIMER, QUANTITATIVE: D-Dimer, Quant: 3.13 ug/mL-FEU — ABNORMAL HIGH (ref 0.00–0.50)

## 2016-03-14 LAB — BRAIN NATRIURETIC PEPTIDE: B Natriuretic Peptide: 162.1 pg/mL — ABNORMAL HIGH (ref 0.0–100.0)

## 2016-03-14 MED ORDER — ADULT MULTIVITAMIN W/MINERALS CH
1.0000 | ORAL_TABLET | Freq: Every day | ORAL | Status: DC
  Administered 2016-03-15 – 2016-03-20 (×5): 1 via ORAL
  Filled 2016-03-14 (×5): qty 1

## 2016-03-14 MED ORDER — FLUDROCORTISONE ACETATE 0.1 MG PO TABS
100.0000 ug | ORAL_TABLET | Freq: Every day | ORAL | Status: DC
Start: 2016-03-15 — End: 2016-03-18
  Administered 2016-03-15 – 2016-03-16 (×2): 100 ug via ORAL
  Filled 2016-03-14 (×3): qty 1

## 2016-03-14 MED ORDER — ENOXAPARIN SODIUM 40 MG/0.4ML ~~LOC~~ SOLN
40.0000 mg | SUBCUTANEOUS | Status: DC
  Administered 2016-03-15 (×2): 40 mg via SUBCUTANEOUS
  Filled 2016-03-14 (×2): qty 0.4

## 2016-03-14 MED ORDER — PROMETHAZINE HCL 25 MG PO TABS: 25.0000 mg | ORAL_TABLET | Freq: Three times a day (TID) | ORAL | Status: DC | PRN

## 2016-03-14 MED ORDER — ONDANSETRON HCL 4 MG PO TABS: 8.0000 mg | ORAL_TABLET | Freq: Three times a day (TID) | ORAL | Status: DC | PRN

## 2016-03-14 MED ORDER — ONDANSETRON HCL 4 MG/2ML IJ SOLN: 4.0000 mg | Freq: Three times a day (TID) | INTRAMUSCULAR | Status: DC | PRN

## 2016-03-14 MED ORDER — PREGABALIN 75 MG PO CAPS
200.0000 mg | ORAL_CAPSULE | Freq: Two times a day (BID) | ORAL | Status: DC
  Administered 2016-03-15 – 2016-03-20 (×11): 200 mg via ORAL
  Filled 2016-03-14: qty 4
  Filled 2016-03-14: qty 2
  Filled 2016-03-14: qty 1
  Filled 2016-03-14 (×3): qty 4
  Filled 2016-03-14: qty 1
  Filled 2016-03-14: qty 4
  Filled 2016-03-14 (×2): qty 1
  Filled 2016-03-14: qty 2

## 2016-03-14 MED ORDER — ONDANSETRON HCL 4 MG/2ML IJ SOLN: 4.0000 mg | Freq: Four times a day (QID) | INTRAMUSCULAR | Status: DC | PRN

## 2016-03-14 MED ORDER — DEXTROSE 5 % IV SOLN
1.0000 g | INTRAVENOUS | Status: DC
  Administered 2016-03-15 – 2016-03-16 (×3): 1 g via INTRAVENOUS
  Filled 2016-03-14 (×3): qty 10

## 2016-03-14 MED ORDER — SODIUM CHLORIDE 0.9 % IV SOLN: 250.0000 mL | INTRAVENOUS | Status: DC | PRN

## 2016-03-14 MED ORDER — PROCHLORPERAZINE MALEATE 10 MG PO TABS: 10.0000 mg | ORAL_TABLET | Freq: Four times a day (QID) | ORAL | Status: DC | PRN

## 2016-03-14 MED ORDER — IBUPROFEN 200 MG PO TABS
600.0000 mg | ORAL_TABLET | Freq: Three times a day (TID) | ORAL | Status: DC
  Administered 2016-03-15: 600 mg via ORAL
  Filled 2016-03-14 (×2): qty 3

## 2016-03-14 MED ORDER — IOPAMIDOL (ISOVUE-370) INJECTION 76%
100.0000 mL | Freq: Once | INTRAVENOUS | Status: AC | PRN
  Administered 2016-03-14: 100 mL via INTRAVENOUS

## 2016-03-14 MED ORDER — SUVOREXANT 20 MG PO TABS
20.0000 mg | ORAL_TABLET | Freq: Every evening | ORAL | Status: DC | PRN
  Filled 2016-03-14: qty 1

## 2016-03-14 MED ORDER — FUROSEMIDE 10 MG/ML IJ SOLN: 60.0000 mg | Freq: Once | INTRAMUSCULAR | Status: DC

## 2016-03-14 MED ORDER — LORAZEPAM 0.5 MG PO TABS
0.5000 mg | ORAL_TABLET | Freq: Four times a day (QID) | ORAL | Status: DC | PRN
  Filled 2016-03-14: qty 1

## 2016-03-14 MED ORDER — PANTOPRAZOLE SODIUM 40 MG PO TBEC
40.0000 mg | DELAYED_RELEASE_TABLET | Freq: Two times a day (BID) | ORAL | Status: DC
  Administered 2016-03-15 – 2016-03-20 (×11): 40 mg via ORAL
  Filled 2016-03-14 (×11): qty 1

## 2016-03-14 MED ORDER — METHOCARBAMOL 500 MG PO TABS
750.0000 mg | ORAL_TABLET | Freq: Three times a day (TID) | ORAL | Status: DC | PRN
  Administered 2016-03-17 – 2016-03-20 (×5): 750 mg via ORAL
  Filled 2016-03-14 (×5): qty 2

## 2016-03-14 MED ORDER — DEXTROSE 5 % IV SOLN
500.0000 mg | INTRAVENOUS | Status: DC
  Administered 2016-03-15 – 2016-03-18 (×5): 500 mg via INTRAVENOUS
  Filled 2016-03-14 (×5): qty 500

## 2016-03-14 MED ORDER — SODIUM CHLORIDE 0.9% FLUSH: 3.0000 mL | INTRAVENOUS | Status: DC | PRN

## 2016-03-14 MED ORDER — ORAL CARE MOUTH RINSE
15.0000 mL | Freq: Two times a day (BID) | OROMUCOSAL | Status: DC
  Administered 2016-03-15 – 2016-03-19 (×5): 15 mL via OROMUCOSAL

## 2016-03-14 MED ORDER — FLUOXETINE HCL 20 MG PO CAPS
40.0000 mg | ORAL_CAPSULE | Freq: Every day | ORAL | Status: DC
  Administered 2016-03-15 – 2016-03-20 (×5): 40 mg via ORAL
  Filled 2016-03-14 (×5): qty 2

## 2016-03-14 MED ORDER — SODIUM CHLORIDE 0.9% FLUSH
10.0000 mL | INTRAVENOUS | Status: DC | PRN
  Administered 2016-03-19: 3 mL
  Filled 2016-03-14: qty 40

## 2016-03-14 MED ORDER — LEVOTHYROXINE SODIUM 100 MCG PO TABS
200.0000 ug | ORAL_TABLET | Freq: Every day | ORAL | Status: DC
  Administered 2016-03-15 – 2016-03-20 (×5): 200 ug via ORAL
  Filled 2016-03-14 (×4): qty 2
  Filled 2016-03-14: qty 4

## 2016-03-14 MED ORDER — FUROSEMIDE 10 MG/ML IJ SOLN: 40.0000 mg | Freq: Every day | INTRAMUSCULAR | Status: DC

## 2016-03-14 MED ORDER — FENTANYL 100 MCG/HR TD PT72
100.0000 ug | MEDICATED_PATCH | TRANSDERMAL | Status: DC
  Administered 2016-03-16: 100 ug via TRANSDERMAL
  Filled 2016-03-14: qty 1

## 2016-03-14 MED ORDER — FUROSEMIDE 10 MG/ML IJ SOLN
40.0000 mg | Freq: Once | INTRAMUSCULAR | Status: AC
  Administered 2016-03-14: 40 mg via INTRAVENOUS
  Filled 2016-03-14: qty 4

## 2016-03-14 MED ORDER — SODIUM CHLORIDE 0.9% FLUSH: 3.0000 mL | Freq: Two times a day (BID) | INTRAVENOUS | Status: DC

## 2016-03-14 MED ORDER — TIZANIDINE HCL 4 MG PO TABS
4.0000 mg | ORAL_TABLET | Freq: Every day | ORAL | Status: DC
  Administered 2016-03-15 – 2016-03-19 (×6): 4 mg via ORAL
  Filled 2016-03-14 (×7): qty 1

## 2016-03-14 MED ORDER — POLYETHYLENE GLYCOL 3350 17 G PO PACK: 17.0000 g | PACK | Freq: Every day | ORAL | Status: DC | PRN

## 2016-03-14 MED ORDER — PANCRELIPASE (LIP-PROT-AMYL) 12000-38000 UNITS PO CPEP
24000.0000 [IU] | ORAL_CAPSULE | Freq: Three times a day (TID) | ORAL | Status: DC
  Administered 2016-03-15 – 2016-03-20 (×14): 24000 [IU] via ORAL
  Filled 2016-03-14 (×16): qty 2

## 2016-03-14 MED ORDER — ACETAMINOPHEN 325 MG PO TABS: 650.0000 mg | ORAL_TABLET | ORAL | Status: DC | PRN

## 2016-03-14 MED ORDER — OXYCODONE HCL 5 MG PO TABS
20.0000 mg | ORAL_TABLET | ORAL | Status: DC | PRN
  Administered 2016-03-14 – 2016-03-20 (×21): 20 mg via ORAL
  Filled 2016-03-14 (×21): qty 4

## 2016-03-14 NOTE — ED Notes (Signed)
I attempted to collect labs patient stated I could not draw from her right arm and I went to look on the left and patient stated she had a port.

## 2016-03-14 NOTE — ED Notes (Signed)
Will give bedside report.  

## 2016-03-14 NOTE — ED Notes (Signed)
Pt transported to CT ?

## 2016-03-14 NOTE — Significant Event (Addendum)
CTA reviewed:  Patient with B patchy CAP 1) holding further lasix for the moment, though she definatly has 3+ BLE pitting edema 2) PNA pathway AND abx ordered. 3) BP borderline -  at 125 cc/hr and 500 cc bolus

## 2016-03-14 NOTE — H&P (Signed)
History and Physical    Kerri Vasquez JYN:[id number] DOB: [birth year] DOA: 03/14/2016   PCP: [name], MD Chief Complaint:  Chief Complaint  Patient presents with  . Shortness of Breath    HPI: Kerri Vasquez is a 60 y.o. 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  Patient presents to the ED today with shortness of breath.  Per EMS report home health nurse came out take care of patients decubitus ulcer today and noticed she was short of breath.  Patient endorses shortness of breath for the past 2 days as well as worsening peripheral edema over the past 2 days.  She also endorses chest pressure for the past 2 days.  Currently reporting chest pressure at this time. Patient states the SOB has been constant. Gradually worsening. She denies any fever, has mild cough productive of clear mucus.  Patient is not ambulatory at baseline.  When EMS arrived she was sating in the 64s.  She was placed on CPAP by EMS. The patient has no history of using O2 at home.  Patient lives at home with daughter.  Patient states that she was taken off of her Lasix pill 2 months ago by her oncologist.  Denies any history of blood clots.  Patient was amended to the hospital 02/02/16 for rib fracture due to fall and pain control. Patient states she has been feeling fine up until 2 days ago. She denies any fever, chills, headache, vision changes, lightheadedness, dizziness, nausea, emesis, abdominal pain, urinary signs, change in bowel habits, numbness/tingling.  ED Course: CXR negative, patient with 3+ pitting edema BLE, able to be weaned down to [place].  Given lasix.  D.Dimer elevated and patient tachycardic to 120s, CTA chest pending.  No recent increased stress or  reason to go back into takotsubo per patient.  Review of Systems: As per HPI otherwise 10 point review of systems negative.    Past Medical History:  Diagnosis Date  . Anemia   . Breast cancer, right breast ([place]) dx'd 2008   right  . CHF (congestive heart failure) ([place])   . Chronic lower back pain   . Depression   . Diabetes mellitus    type II, controlled by diet 05/25/11   . Fibromyalgia   . Fracture of multiple toes    right toes x 4 toes- excluding small toe  . GERD (gastroesophageal reflux disease)   . Hypertension   . Hypothyroidism   . Incisional hernia   . Intestinal obstruction   . Iron deficiency anemia due to chronic blood loss 07/29/2015  . 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 Migraine    "weekly; but they don't last long" (08/26/2014)  . Myocardial infarction 06/2014   S/P percutaneous intervention procedure a metastatic lesion in the liver  . Nausea and vomiting 04/2015  . Osteoarthritis of multiple joints   . Pernicious anemia 05/30/2011   Iron transfusion and VB12 on 06/06/11  . Renal insufficiency    hx renal failure 2011  . Sciatica   . Scoliosis   . Septic arthritis ([place]) 01/28/10   and spinal stenosis , moderate scoliosis   . Sleep apnea    hx of off machine x 4 years   . Spinal stenosis   .  Thyroid cancer ([place]) 2013    Past Surgical History:  Procedure Laterality Date  . BREAST BIOPSY Right 2008  . BREAST IMPLANT REMOVAL Left 2015  . BREAST RECONSTRUCTION Bilateral 2008; 2009   "w/the mastectomy; "  . BREAST RECONSTRUCTION Left ~ 2010   "it capsized"  . CARDIAC CATHETERIZATION    . CHOLECYSTECTOMY OPEN  06/29/85  . COLECTOMY  2011   "SBO"  . GASTRIC BYPASS  2006   "w/scope"  . LEFT HEART CATHETERIZATION WITH CORONARY ANGIOGRAM N/A 07/14/2014   Procedure: LEFT HEART CATHETERIZATION WITH CORONARY ANGIOGRAM;  Surgeon: Leonie Man, MD;  Location: [facility] CATH LAB;  Service: Cardiovascular;  Laterality: N/A;  . LIVER  BIOPSY  04/2015  . MASTECTOMY Bilateral 2008  . MULTIPLE EXTRACTIONS WITH ALVEOLOPLASTY  06/08/2011   Procedure: MULTIPLE EXTRACION WITH ALVEOLOPLASTY;  Surgeon: Lenn Cal, DDS;  Location: [facility] ORS;  Service: Oral Surgery;  Laterality: N/A;  Extraction of tooth #'s 3,4,7 with alveoloplasty and Gross Debridement of Remaining Teeth  . [place] LIVER TUMOR  2011; 01/26/2012  . THYROID LOBECTOMY  02/16/2012   Procedure: THYROID LOBECTOMY;  Surgeon: Earnstine Regal, MD;  Location: [facility] ORS;  Service: General;  Laterality: Right;  . TONSILLECTOMY  1969     reports that she has never smoked. She has never used smokeless tobacco. She reports that she does not drink alcohol or use drugs.  Allergies  Allergen Reactions  . Influenza Vac Split [Flu Virus Vaccine] Other (See Comments)    Joint stiffness and renal failure  . Pneumococcal Vaccines Other (See Comments)    "sepsis and renal failure"  . Ritalin [Methylphenidate Hcl]     "Heart attack"  . Zostavax [Zoster Vaccine Live ([employer])] Other (See Comments)    Joint stiffness, renal failure  . Adhesive [Tape] Itching and Rash    Family History  Problem Relation Age of Onset  . Cancer Mother     breast  . Hypertension Mother   . Anesthesia problems Daughter       Prior to Admission medications   Medication Sig Start Date End Date Taking? Authorizing Provider  cyanocobalamin (,VITAMIN B-12,) 1000 MCG/ML injection INJECT 1 ML AS DIRECTED EVERY 21 DAYS 03/18/15  Yes [place], NP  dexamethasone (DECADRON) 4 MG tablet Take 2 tablets by mouth 2 (two) times daily. Takes the day after chemo 02/29/16  Yes Historical Provider, MD  diphenoxylate-atropine (LOMOTIL) 2.5-0.025 MG tablet TAKE 1 TABLET BY MOUTH FOUR TIMES DAILY AS NEEDED FOR DIARRHEA 03/09/16  Yes Volanda Napoleon, MD  fentaNYL (DURAGESIC - DOSED MCG/HR) 100 MCG/HR Place 1 patch (100 mcg total) onto the skin every 3 (three) days. 03/09/16  Yes Volanda Napoleon, MD    fentaNYL (DURAGESIC - DOSED MCG/HR) 25 MCG/HR patch Place 1 patch (25 mcg total) onto the skin every 3 (three) days. 03/09/16  Yes Volanda Napoleon, MD  fludrocortisone (FLORINEF) 0.1 MG tablet TAKE 1 TABLET BY MOUTH DAILY 03/10/16  Yes Volanda Napoleon, MD  FLUoxetine (PROZAC) 40 MG capsule Take 40 mg by mouth daily before breakfast.    Yes Historical Provider, MD  ibuprofen (ADVIL,MOTRIN) 600 MG tablet Take 600 mg by mouth every 8 (eight) hours.  01/13/16  Yes Historical Provider, MD  levothyroxine (SYNTHROID, LEVOTHROID) 200 MCG tablet Take 200 mcg by mouth daily with breakfast. 03/09/16  Yes Historical Provider, MD  lidocaine-prilocaine (EMLA) cream Apply 1 application topically as needed for port access 12/30/15  Yes Historical Provider, MD  lipase/protease/amylase 24000-76000 units CPEP Take 1 capsule (24,000 Units total) by mouth 3 (three) times daily before meals. 02/09/16  Yes Hosie Poisson, MD  LORazepam (ATIVAN) 0.5 MG tablet PLACE 1 TABLET UNDER TONGUE EVERY 6 HOURS AS NEEDED FOR NAUSEA AND VOMITING 01/20/16  Yes Volanda Napoleon, MD  methocarbamol (ROBAXIN) 750 MG tablet TAKE 1 TABLET BY MOUTH THREE TIMES DAILY AS NEEDED Patient taking differently: TAKE 1 TABLET BY MOUTH every 8 hours for muscle spasms 02/22/16  Yes Volanda Napoleon, MD  Multiple Vitamin (MULTI-VITAMINS) TABS Take 1 tablet by mouth daily. 11/29/11  Yes Historical Provider, MD  nitroGLYCERIN (NITROSTAT) 0.4 MG SL tablet Place 1 tablet (0.4 mg total) under the tongue every 5 (five) minutes as needed for chest pain. 07/18/14  Yes Annita Brod, MD  ondansetron (ZOFRAN) 8 MG tablet TAKE 1 TABLET BY MOUTH EVERY 8 HOURS AS NEEDED FOR NAUSEA AND VOMITING. 02/22/16  Yes Volanda Napoleon, MD  Oxycodone HCl 20 MG TABS Take 1 tablet (20 mg total) by mouth every 4 (four) hours as needed. Patient taking differently: Take 20 mg by mouth every 4 (four) hours as needed (for pain).  03/09/16  Yes Volanda Napoleon, MD  pantoprazole (PROTONIX) 40 MG  tablet Take 1 tablet (40 mg total) by mouth 2 (two) times daily. 09/16/15  Yes Volanda Napoleon, MD  polyethylene glycol (MIRALAX / GLYCOLAX) packet Take 17 g by mouth 2 (two) times daily. Patient taking differently: Take 17 g by mouth daily as needed for mild constipation.  09/16/15  Yes Volanda Napoleon, MD  pregabalin (LYRICA) 200 MG capsule Take 1 capsule (200 mg total) by mouth 2 (two) times daily. 02/09/16  Yes Hosie Poisson, MD  prochlorperazine (COMPAZINE) 10 MG tablet TAKE 1 TABLET BY MOUTH EVERY 6 HOURS AS NEEDED FOR NAUSEA AND VOMITING 03/09/16  Yes Volanda Napoleon, MD  promethazine (PHENERGAN) 25 MG tablet TAKE 1 TABLET BY MOUTH EVERY 8 HOURS AS NEEDED FOR NAUSEA OR VOMITING. 03/09/16  Yes Volanda Napoleon, MD  Suvorexant (BELSOMRA) 20 MG TABS Take 20 mg by mouth at bedtime as needed. Patient taking differently: Take 20 mg by mouth at bedtime.  12/23/15  Yes Volanda Napoleon, MD  tiZANidine (ZANAFLEX) 4 MG capsule TAKE 1 CAPSULE BY MOUTH EVERY 8 HOURS Patient taking differently: TAKE 1 CAPSULE BY MOUTH every night at bedtime 02/22/16  Yes Volanda Napoleon, MD    Physical Exam: Vitals:   03/14/16 1700 03/14/16 1809 03/14/16 1845 03/14/16 1951  BP: 131/97 128/70 118/80 124/89  Pulse: 93 90 117 115  Resp: '14 17 16 16  ' Temp:    97.7 F (36.5 C)  TempSrc:    Oral  SpO2: 100% 100% 100% 95%  Weight:      Height:          Constitutional: NAD, calm, comfortable Eyes: PERRL, lids and conjunctivae normal ENMT: Mucous membranes are moist. Posterior pharynx clear of any exudate or lesions.Normal dentition.  Neck: normal, supple, no masses, no thyromegaly Respiratory: Coarse crackles bilaterally Cardiovascular: Tachycardic, regular, no murmurs / rubs / gallops. 3+ pitting edema BLE. 2+ pedal pulses. No carotid bruits.  Abdomen: no tenderness, no masses palpated. No hepatosplenomegaly. Bowel sounds positive.  Musculoskeletal: no clubbing / cyanosis. No joint deformity upper and lower  extremities. Good ROM, no contractures. Normal muscle tone.  Skin: no rashes, lesions, ulcers. No induration Neurologic: CN 2-12 grossly intact. Sensation intact, DTR normal. Strength 5/5 in all 4.  Psychiatric:  Normal judgment and insight. Alert and oriented x 3. Normal mood.   Labs on Admission: I have personally reviewed following labs and imaging studies  CBC:  Recent Labs Lab 03/14/16 1758  WBC 13.4*  NEUTROABS 11.9*  HGB 12.5  HCT 38.1  MCV 100.0  PLT 956*   Basic Metabolic Panel:  Recent Labs Lab 03/14/16 1758  NA 147*  K 3.5  CL 115*  CO2 23  GLUCOSE 122*  BUN 36*  CREATININE 0.81  CALCIUM 9.0   GFR: Estimated Creatinine Clearance: 99 mL/min (by C-G formula based on SCr of 0.81 mg/dL). Liver Function Tests:  Recent Labs Lab 03/14/16 1758  AST 23  ALT 22  ALKPHOS 80  BILITOT 1.2  PROT 6.7  ALBUMIN 3.1*   No results for input(s): LIPASE, AMYLASE in the last 168 hours. No results for input(s): AMMONIA in the last 168 hours. Coagulation Profile: No results for input(s): INR, PROTIME in the last 168 hours. Cardiac Enzymes: No results for input(s): CKTOTAL, CKMB, CKMBINDEX, TROPONINI in the last 168 hours. BNP (last 3 results) No results for input(s): PROBNP in the last 8760 hours. HbA1C: No results for input(s): HGBA1C in the last 72 hours. CBG: No results for input(s): GLUCAP in the last 168 hours. Lipid Profile: No results for input(s): CHOL, HDL, LDLCALC, TRIG, CHOLHDL, LDLDIRECT in the last 72 hours. Thyroid Function Tests: No results for input(s): TSH, T4TOTAL, FREET4, T3FREE, THYROIDAB in the last 72 hours. Anemia Panel: No results for input(s): VITAMINB12, FOLATE, FERRITIN, TIBC, IRON, RETICCTPCT in the last 72 hours. Urine analysis:    Component Value Date/Time   COLORURINE AMBER (A) 02/06/2016 1953   APPEARANCEUR CLEAR 02/06/2016 1953   LABSPEC 1.028 02/06/2016 1953   PHURINE 6.0 02/06/2016 1953   GLUCOSEU NEGATIVE 02/06/2016 1953     HGBUR NEGATIVE 02/06/2016 1953   BILIRUBINUR SMALL (A) 02/06/2016 1953   BILIRUBINUR negative 05/24/2011 1630   KETONESUR NEGATIVE 02/06/2016 1953   PROTEINUR NEGATIVE 02/06/2016 1953   UROBILINOGEN 0.2 08/26/2014 1850   NITRITE NEGATIVE 02/06/2016 1953   LEUKOCYTESUR SMALL (A) 02/06/2016 1953   Sepsis Labs: '@LABRCNTIP' (procalcitonin:4,lacticidven:4) )No results found for this or any previous visit (from the past 240 hour(s)).   Radiological Exams on Admission: Dg Chest Port 1 View  Result Date: 03/14/2016 CLINICAL DATA:  Shortness of breath today, history breast cancer, CHF, diabetes mellitus, hypertension EXAM: PORTABLE CHEST 1 VIEW COMPARISON:  Portable exam 1638 hours compared 02/02/2016 FINDINGS: RIGHT jugular Port-A-Cath with tip projecting over cavoatrial junction. Enlargement of cardiac silhouette. Mediastinal contours and pulmonary vascularity grossly normal for technique. Peribronchial thickening without infiltrate, pleural effusion or pneumothorax. Diffuse osseous demineralization. IMPRESSION: Enlargement of cardiac silhouette. Mild bronchitic changes without infiltrate. Electronically Signed   By: Lavonia Dana M.D.   On: 03/14/2016 17:05    EKG: Independently reviewed.  Assessment/Plan Principal Problem:   Acute respiratory failure with hypoxia (HCC) Active Problems:   Breast cancer metastasized to liver (HCC)   Acute on chronic diastolic CHF (congestive heart failure) (HCC)   Tachycardia   Decubitus ulcer of sacral region, stage 3 ([place])    1. Acute resp failure with hypoxia - clinically appears to be fluid overloaded / CHF 1. CHF pathway 2. Lasix being given in ED 3. Lasix 26m IV daily ordered 4. Daily BMP 5. Intake and output 2. Sacral decubitus  1. Due to bed-ridden status with stage 3 sacral decubitus will order foley (discussed risk of UTI with patient and daughter) 2. Wound care consult 3.  Tachycardia and elevated D.Dimer - 1. CTA chest PE study  ordered 4. Metastatic BRCA - 1. not currently on chemo 2. Poor baseline functioning (bed-bound) 3. Likely warrants palliative care consult / discussion while here   DVT prophylaxis: Lovenox Code Status: Full Family Communication: Daughter at bedside Consults called: None Admission status: Admit to inpatient   Etta Quill DO [facility] Pager [phone] from 7PM-7AM  If 7AM-7PM, please contact the day physician for the patient [email or web address] Password [email or web address]  03/14/2016, 8:15 PM

## 2016-03-14 NOTE — Progress Notes (Signed)
Patient removed from BiPAP and placed on [place] @ 5 L. O2 sats 97%. Patient tolerating [place] well. RT will continue to monitor patient.

## 2016-03-14 NOTE — Progress Notes (Signed)
Verified with patients daughter, [name], that it is acceptable to use her left arm for lab and IV's.

## 2016-03-14 NOTE — ED Notes (Signed)
Hospitalist at bedside 

## 2016-03-14 NOTE — ED Notes (Signed)
Spoke with Dr. Sherry Ruffing, at this time, he recommends not accessing her port and using her IV that is in place.

## 2016-03-14 NOTE — ED Triage Notes (Signed)
Patient is from home and transported via [facility]. Pt is being treated for a bed sore by home health. Today, home health nurse came out to care for patient. She was short of breath. Pt's daughter states she has been [facility] for the last 2 days. Pt is alert, oriented x 4. Per EMS, patients initial O2 sat by the home health nurse was in the 70's. When EMS arrived patient was immediatly placed on C-pap. Pt does not use O2 at home.

## 2016-03-14 NOTE — ED Provider Notes (Signed)
[place] DEPT Provider Note   CSN: [id number] Arrival date & time: 03/14/16  1529     History   Chief Complaint Chief Complaint  Patient presents with  . Shortness of Breath    HPI Kerri Vasquez is a 60 y.o. female.  60 year old Hispanic female with a past medical history significant for stage IV breast cancer currently not on therapy, congestive heart failure (last EF 55-60% on 04/2015),Chronic low back Pain currently on fentanyl patches, diabetes, decubital ulcer, That presents to the ED today with shortness of breath. Patient presents by EMS. Per EMS report home health nurse came out take care of patients decubitus ulcer today and noticed she was short of breath. Patient endorses shortness of breath for the past 2 days. She also endorses chest pressure for the past 2 days. States the pain does not radiate. Currently reporting chest pressure at this time.Patient states the pain has been constant. Gradually worsening. She denies any fever or cough. Patient is not ambulatory at baseline. When EMS arrived she was sating in the 45s. She was placed on CPAP by EMS. The patient has no history of using O2 at home.Patient lives at home with daughter.Patient states that she was taken off of her Lasix pill 2 months ago by her oncologist. She endorses worsening lower leg edema. Denies any history of blood clots.Patient was amended to the hospital 02/02/16 for rib fracture due to fall and pain control. Patient states she has been feeling fine up until 2 days ago. Daughter at bedside states that her ulcer is "getting worse". She has been debridding the wound and states that the edges have become black.  She denies any fever, chills, headache, vision changes, lightheadedness, dizziness, nausea, emesis, abdominal pain, urinary signs, change in bowel habits, numbness/tingling.      Past Medical History:  Diagnosis Date  . Anemia   . Breast cancer, right breast ([place]) dx'd 2008   right    . CHF (congestive heart failure) ([place])   . Chronic lower back pain   . Depression   . Diabetes mellitus    type II, controlled by diet 05/25/11   . Fibromyalgia   . Fracture of multiple toes    right toes x 4 toes- excluding small toe  . GERD (gastroesophageal reflux disease)   . Hypertension   . Hypothyroidism   . Incisional hernia   . Intestinal obstruction   . Iron deficiency anemia due to chronic blood loss 07/29/2015  . 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 Migraine    "weekly; but they don't last long" (08/26/2014)  . Myocardial infarction 06/2014   S/P percutaneous intervention procedure a metastatic lesion in the liver  . Nausea and vomiting 04/2015  . Osteoarthritis of multiple joints   . Pernicious anemia 05/30/2011   Iron transfusion and VB12 on 06/06/11  . Renal insufficiency    hx renal failure 2011  . Sciatica   . Scoliosis   . Septic arthritis ([place]) 01/28/10   and spinal stenosis , moderate scoliosis   . Sleep apnea    hx of off machine x 4 years   . Spinal stenosis   . Thyroid cancer [facility]) 2013    Patient Active Problem List   Diagnosis Date Noted  . Pressure injury of skin 02/03/2016  . Rib fracture 02/02/2016  . Fall 02/02/2016  . Iron deficiency anemia due to chronic blood loss 07/29/2015  . 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  Elevated troponin 04/20/2015  . Chest discomfort 04/20/2015  . Vomiting 04/19/2015  . Intractable nausea and vomiting 04/19/2015  . Acute kidney injury superimposed on chronic kidney disease ([place]) 04/19/2015  . Primary malignant neoplasm of breast ([place]) 12/07/2014  . Takotsubo syndrome 09/04/2014  . Chronic diastolic CHF (congestive heart failure) ([place]) 09/04/2014  . Chronic diastolic heart failure ([place]) 09/04/2014  . Apical ballooning syndrome 09/04/2014  . Dehydration   . Peripheral edema   . AKI (acute kidney injury) ([place]) 08/26/2014  . Chronic systolic heart failure ([place]) 08/13/2014  . DNR  (do not resuscitate) discussion 07/15/2014  . Weakness generalized 07/15/2014  . Palliative care encounter 07/15/2014  . Chronic pain syndrome 07/15/2014  . Acute systolic heart failure ([place]) 07/14/2014  . Dyspnea   . Congestive dilated cardiomyopathy ([place])   . Shock circulatory ([place]) 07/13/2014  . Encephalopathy acute 07/13/2014  . Pleural effusion 07/11/2014  . DM2 (diabetes mellitus, type 2) ([place]) 07/11/2014  . Essential hypertension 07/11/2014  . Hypothyroidism 07/11/2014  . NSTEMI (non-ST elevated myocardial infarction) ([place]) 07/11/2014  . SOB (shortness of breath) 07/11/2014  . Acquired hypothyroidism 07/11/2014  . Chest pain   . Other specified hypotension   . Breast cancer metastasized to liver ([place])   . Fibrositis 08/06/2013  . Diabetes mellitus ([place]) 08/06/2013  . Fibromyalgia 08/06/2013  . Personal history of other diseases of the digestive system 08/06/2013  . Obstructive apnea 08/06/2013  . Lumbar canal stenosis 10/25/2012  . Degeneration of intervertebral disc of lumbosacral region 10/05/2012  . Rheumatoid arthritis ([place]) 10/05/2012  . 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 Pernicious anemia 05/30/2011  . Anemia 05/29/2011  . Sleep apnea 05/29/2011  . Degenerative joint disease 05/16/2011  . Arthritis, degenerative 05/16/2011  . Adenocarcinoma, breast ([place]) 03/07/2011  . Rheumatoid arthritis(714.0) 03/07/2011  . Depression 03/07/2011  . Migraine 03/07/2011  . GERD (gastroesophageal reflux disease) 03/07/2011  . Acid reflux 03/07/2011  . Spinal stenosis with chronic low back pain     Past Surgical History:  Procedure Laterality Date  . BREAST BIOPSY Right 2008  . BREAST IMPLANT REMOVAL Left 2015  . BREAST RECONSTRUCTION Bilateral 2008; 2009   "w/the mastectomy; "  . BREAST RECONSTRUCTION Left ~ 2010   "it capsized"  . CARDIAC CATHETERIZATION    . CHOLECYSTECTOMY OPEN  06/29/85  . COLECTOMY  2011   "SBO"  . GASTRIC BYPASS  2006    "w/scope"  . LEFT HEART CATHETERIZATION WITH CORONARY ANGIOGRAM N/A 07/14/2014   Procedure: LEFT HEART CATHETERIZATION WITH CORONARY ANGIOGRAM;  Surgeon: Leonie Man, MD;  Location: [facility] CATH LAB;  Service: Cardiovascular;  Laterality: N/A;  . LIVER BIOPSY  04/2015  . MASTECTOMY Bilateral 2008  . MULTIPLE EXTRACTIONS WITH ALVEOLOPLASTY  06/08/2011   Procedure: MULTIPLE EXTRACION WITH ALVEOLOPLASTY;  Surgeon: Lenn Cal, DDS;  Location: [facility] ORS;  Service: Oral Surgery;  Laterality: N/A;  Extraction of tooth #'s 3,4,7 with alveoloplasty and Gross Debridement of Remaining Teeth  . [place] LIVER TUMOR  2011; 01/26/2012  . THYROID LOBECTOMY  02/16/2012   Procedure: THYROID LOBECTOMY;  Surgeon: Earnstine Regal, MD;  Location: [facility] ORS;  Service: General;  Laterality: Right;  . TONSILLECTOMY  1969    OB History    No data available       Home Medications    Prior to Admission medications   Medication Sig Start Date End Date Taking? Authorizing Provider  cyanocobalamin (,VITAMIN B-12,) 1000 MCG/ML injection INJECT 1  ML AS DIRECTED EVERY 21 DAYS 03/18/15   Eliezer Bottom, NP  diphenoxylate-atropine (LOMOTIL) 2.5-0.025 MG tablet TAKE 1 TABLET BY MOUTH FOUR TIMES DAILY AS NEEDED FOR DIARRHEA 03/09/16   Volanda Napoleon, MD  fentaNYL (DURAGESIC - DOSED MCG/HR) 100 MCG/HR Place 1 patch (100 mcg total) onto the skin every 3 (three) days. 03/09/16   Volanda Napoleon, MD  fentaNYL (DURAGESIC - DOSED MCG/HR) 25 MCG/HR patch Place 1 patch (25 mcg total) onto the skin every 3 (three) days. 03/09/16   Volanda Napoleon, MD  fluconazole (DIFLUCAN) 100 MG tablet Take 1 tablet (100 mg total) by mouth daily. 02/10/16   Hosie Poisson, MD  fludrocortisone (FLORINEF) 0.1 MG tablet TAKE 1 TABLET BY MOUTH DAILY 03/10/16   Volanda Napoleon, MD  FLUoxetine (PROZAC) 40 MG capsule Take 40 mg by mouth daily before breakfast.     Historical Provider, MD  furosemide (LASIX) 40 MG tablet Take 1 tablet (40 mg  total) by mouth daily. 02/09/16   Hosie Poisson, MD  ibuprofen (ADVIL,MOTRIN) 600 MG tablet Take 600 mg by mouth every 8 (eight) hours as needed for pain. 01/13/16   Historical Provider, MD  levothyroxine (SYNTHROID, LEVOTHROID) 150 MCG tablet Take 1 tablet (150 mcg total) by mouth daily. 01/20/16   Volanda Napoleon, MD  lidocaine-prilocaine (EMLA) cream Apply 1 application topically as needed for port access 12/30/15   Historical Provider, MD  lipase/protease/amylase 24000-76000 units CPEP Take 1 capsule (24,000 Units total) by mouth 3 (three) times daily before meals. 02/09/16   Hosie Poisson, MD  LORazepam (ATIVAN) 0.5 MG tablet PLACE 1 TABLET UNDER TONGUE EVERY 6 HOURS AS NEEDED FOR NAUSEA AND VOMITING 01/20/16   Volanda Napoleon, MD  methocarbamol (ROBAXIN) 750 MG tablet TAKE 1 TABLET BY MOUTH THREE TIMES DAILY AS NEEDED 02/22/16   Volanda Napoleon, MD  Multiple Vitamin (MULTI-VITAMINS) TABS Take 1 tablet by mouth daily. 11/29/11   Historical Provider, MD  nitroGLYCERIN (NITROSTAT) 0.4 MG SL tablet Place 1 tablet (0.4 mg total) under the tongue every 5 (five) minutes as needed for chest pain. 07/18/14   Annita Brod, MD  ondansetron (ZOFRAN) 8 MG tablet TAKE 1 TABLET BY MOUTH EVERY 8 HOURS AS NEEDED FOR NAUSEA AND VOMITING. 02/22/16   Volanda Napoleon, MD  Oxycodone HCl 20 MG TABS Take 1 tablet (20 mg total) by mouth every 4 (four) hours as needed. 03/09/16   Volanda Napoleon, MD  pantoprazole (PROTONIX) 40 MG tablet Take 1 tablet (40 mg total) by mouth 2 (two) times daily. 09/16/15   Volanda Napoleon, MD  polyethylene glycol [facility] Floria Raveling) packet Take 17 g by mouth 2 (two) times daily. 09/16/15   Volanda Napoleon, MD  potassium chloride SA (K-DUR,KLOR-CON) 20 MEQ tablet TAKE 2 TABLETS BY MOUTH TWICE DAILY 10/19/15   Jolaine Artist, MD  pregabalin (LYRICA) 200 MG capsule Take 1 capsule (200 mg total) by mouth 2 (two) times daily. 02/09/16   Hosie Poisson, MD  prochlorperazine (COMPAZINE) 10 MG tablet  TAKE 1 TABLET BY MOUTH EVERY 6 HOURS AS NEEDED FOR NAUSEA AND VOMITING 03/09/16   Volanda Napoleon, MD  promethazine (PHENERGAN) 25 MG tablet TAKE 1 TABLET BY MOUTH THREE TIMES DAILY AS NEEDED FOR NAUSEA. 10/27/15   Eliezer Bottom, NP  promethazine (PHENERGAN) 25 MG tablet TAKE 1 TABLET BY MOUTH EVERY 8 HOURS AS NEEDED FOR NAUSEA OR VOMITING. 03/09/16   Volanda Napoleon, MD  Suvorexant [facility])  20 MG TABS Take 20 mg by mouth at bedtime as needed. Patient taking differently: Take 20 mg by mouth at bedtime as needed (for sleep).  12/23/15   Volanda Napoleon, MD  tiZANidine (ZANAFLEX) 4 MG capsule TAKE 1 CAPSULE BY MOUTH EVERY 8 HOURS. 01/12/16   Volanda Napoleon, MD  tiZANidine (ZANAFLEX) 4 MG capsule TAKE 1 CAPSULE BY MOUTH EVERY 8 HOURS 02/22/16   Volanda Napoleon, MD    Family History Family History  Problem Relation Age of Onset  . Cancer Mother     breast  . Hypertension Mother   . Anesthesia problems Daughter     Social History Social History  Substance Use Topics  . Smoking status: Never Smoker  . Smokeless tobacco: Never Used     Comment: NEVER USED TOBACCO  . Alcohol use No     Allergies   Influenza vac split [flu virus vaccine]; Pneumococcal vaccines; Ritalin [methylphenidate hcl]; Zostavax [zoster vaccine live (oka-merck)]; and Adhesive [tape]   Review of Systems Review of Systems  Constitutional: Positive for fatigue. Negative for chills and fever.  HENT: Negative for congestion, ear pain and sore throat.   Eyes: Negative for pain and visual disturbance.  Respiratory: Positive for shortness of breath. Negative for cough.   Cardiovascular: Positive for chest pain and leg swelling. Negative for palpitations.  Gastrointestinal: Negative for abdominal pain, diarrhea, nausea and vomiting.  Genitourinary: Negative for dysuria, frequency, hematuria and urgency.  Musculoskeletal: Negative for arthralgias and back pain.  Skin: Positive for wound (decubitus ulcer at baseline).  Negative for color change and rash.  Neurological: Negative for dizziness, syncope, weakness, light-headedness, numbness and headaches.  All other systems reviewed and are negative.    Physical Exam Updated Vital Signs BP 103/82 (BP Location: Right Wrist)   Pulse 94   Temp 97.5 F (36.4 C) (Oral)   Ht 5\' 6"  (1.676 m)   Wt 126.1 kg   LMP 08/30/2006   SpO2 92%   BMI 44.87 kg/m   Physical Exam  Constitutional: She is oriented to person, place, and time. She appears well-developed and well-nourished. No distress.  HENT:  Head: Normocephalic and atraumatic.  Mouth/Throat: Oropharynx is clear and moist.  Eyes: Conjunctivae and EOM are normal. Pupils are equal, round, and reactive to light. Right eye exhibits no discharge. Left eye exhibits no discharge. No scleral icterus.  Neck: Normal range of motion. Neck supple. No thyromegaly present.  Cardiovascular: Normal rate, regular rhythm, normal heart sounds and intact distal pulses.  Exam reveals no gallop and no friction rub.   No murmur heard. Regular rate and rhythm. S1-S2 normal. 3+ pitting edema to lower extremities bilaterally at the level of the foot up to the knee.  Pulmonary/Chest: Effort normal and breath sounds normal. No respiratory distress.  Course crackles and rhonchi noted throughout lung fields. No wheezing noted. Patient with congested cough that is nonproductive. Patient is currently on BiPAP satting 94%. Breath sounds are unlabored. She is not currently respiratory distress. Chest expansion is equal. No accessory muscle use. No retractions.patient is able speak in complete sentences over the BiPAP.  Abdominal: Soft. Bowel sounds are normal. She exhibits no distension. There is no tenderness. There is no rebound and no guarding.  Musculoskeletal: Normal range of motion.  Patient moving all 4 shoulders without any difficulty.  Lymphadenopathy:    She has no cervical adenopathy.  Neurological: She is alert and oriented to  person, place, and time. GCS eye subscore is 4. GCS  verbal subscore is 5. GCS motor subscore is 6.  Skin: Capillary refill takes less than 2 seconds.  Skin is cool and moist. Decubitus ulcere noted to sacrum.  Nursing note and vitals reviewed.    ED Treatments / Results  Labs (all labs ordered are listed, but only abnormal results are displayed) Labs Reviewed  COMPREHENSIVE METABOLIC PANEL  CBC WITH DIFFERENTIAL/PLATELET  BLOOD GAS, ARTERIAL  BRAIN NATRIURETIC PEPTIDE  D-DIMER, QUANTITATIVE (NOT AT [facility])  Randolm Idol, ED    EKG  EKG Interpretation  Date/Time:  Tuesday March 14 2016 16:21:16 EST Ventricular Rate:  88 PR Interval:    QRS Duration: 80 QT Interval:  368 QTC Calculation: 446 R Axis:   18 Text Interpretation:  Sinus rhythm Posterior infarct, old Artifact No significant change since last tracing Confirmed by Maryan Rued  MD, Loree Fee ([id number]) on 03/15/2016 8:03:33 PM       Radiology No results found.  Procedures Procedures (including critical care time)  Medications Ordered in ED Medications - No data to display   Initial Impression / Assessment and Plan / ED Course  I have reviewed the triage vital signs and the nursing notes.  Pertinent labs & imaging results that were available during my care of the patient were reviewed by me and considered in my medical decision making (see chart for details).  Clinical Course   Patient presented to the ED with SOB by EMS on bipap. On my exam patient was satting well and was oxygenated. She did not appear to be in respiratory distress, and no accessory muscle use noted. ABG was obtained with normal pH. Mild leukocytosis noted. Mild hypernatremia noted. CXR was negative without signs of pna or chf. Patient has 3+ pitting edema to BLE. BNP slightly elevated. PAtietn was able to be taken off bipap and waned down to [place]. Patient continued to maintain sats above 90%.   Patient was given IV lasix. D dimer was also noted to  be levated and patient was tachy to the 120s. CTA was obtained and is pending at this time. Decubitus ulcer noted to sacrum that is necrotic appearing. Patient was seen and examined by Dr. Sherry Ruffing who agrees with the above plan. Hospital medicine was called to admit patient. Spoke with Dr. Alcario Drought who agrees to admit patient. She is afebrile and hemodynamically stable at this time.  Agreeable to admission.  CRITICAL CARE Performed by: Ocie Cornfield   Total critical care time: 30 minutes  Critical care time was exclusive of separately billable procedures and treating other patients.  Critical care was necessary to treat or prevent imminent or life-threatening deterioration.  Critical care was time spent personally by me on the following activities: development of treatment plan with patient and/or surrogate as well as nursing, discussions with consultants, evaluation of patient's response to treatment, examination of patient, obtaining history from patient or surrogate, ordering and performing treatments and interventions, ordering and review of laboratory studies, ordering and review of radiographic studies, pulse oximetry and re-evaluation of patient's condition.   Final Clinical Impressions(s) / ED Diagnoses   Final diagnoses:  SOB (shortness of breath)  Hypoxia    New Prescriptions New Prescriptions   No medications on file     Doristine Devoid, PA-C 03/16/16 1859    Gwenyth Allegra Tegeler, MD 03/19/16 2148

## 2016-03-14 NOTE — Progress Notes (Signed)
Discussed blood cultures with Dr Alcario Drought. Pt states they have used her left arm for IV and lab access. It is agreed to not stick her right arm per patient and use her left arm and foot sticks if there is access. The 2nd blood culture will be drawn from [facility]  If foot stick not possible.

## 2016-03-14 NOTE — ED Notes (Signed)
Bed: WA25 Expected date:  Expected time:  Means of arrival:  Comments: Ems-sob 

## 2016-03-14 NOTE — ED Notes (Signed)
Notified Mck[place]ize, RN (Museum/gallery curator) of the 20 minute timer for transport.

## 2016-03-15 ENCOUNTER — Inpatient Hospital Stay (HOSPITAL_COMMUNITY): Payer: Medicare Other

## 2016-03-15 ENCOUNTER — Other Ambulatory Visit: Payer: Self-pay | Admitting: Hematology & Oncology

## 2016-03-15 DIAGNOSIS — A419 Sepsis, unspecified organism: Secondary | ICD-10-CM | POA: Diagnosis present

## 2016-03-15 DIAGNOSIS — R6520 Severe sepsis without septic shock: Secondary | ICD-10-CM

## 2016-03-15 DIAGNOSIS — J189 Pneumonia, unspecified organism: Secondary | ICD-10-CM

## 2016-03-15 DIAGNOSIS — I509 Heart failure, unspecified: Secondary | ICD-10-CM

## 2016-03-15 DIAGNOSIS — J9601 Acute respiratory failure with hypoxia: Secondary | ICD-10-CM

## 2016-03-15 LAB — BASIC METABOLIC PANEL
ANION GAP: 7 (ref 5–15)
BUN: 34 mg/dL — ABNORMAL HIGH (ref 6–20)
CALCIUM: 7.8 mg/dL — AB (ref 8.9–10.3)
CO2: 24 mmol/L (ref 22–32)
Chloride: 114 mmol/L — ABNORMAL HIGH (ref 101–111)
Creatinine, Ser: 0.8 mg/dL (ref 0.44–1.00)
GLUCOSE: 185 mg/dL — AB (ref 65–99)
POTASSIUM: 3.2 mmol/L — AB (ref 3.5–5.1)
Sodium: 145 mmol/L (ref 135–145)

## 2016-03-15 LAB — TROPONIN I: Troponin I: <0.03 ng/mL (ref <0.03)

## 2016-03-15 LAB — MRSA PCR SCREENING: MRSA BY PCR: POSITIVE — AB

## 2016-03-15 LAB — ECHOCARDIOGRAM COMPLETE
HEIGHTINCHES: 66.5 in
WEIGHTICAEL: 4246.94 [oz_av]

## 2016-03-15 LAB — LACTIC ACID, PLASMA
LACTIC ACID, VENOUS: 2.1 mmol/L — AB (ref 0.5–1.9)
LACTIC ACID, VENOUS: 1 mmol/L (ref 0.5–1.9)

## 2016-03-15 LAB — PROCALCITONIN: Procalcitonin: 0.76 ng/mL

## 2016-03-15 LAB — STREP PNEUMONIAE URINARY ANTIGEN: Strep Pneumo Urinary Antigen: NEGATIVE

## 2016-03-15 LAB — HIV ANTIBODY (ROUTINE TESTING W REFLEX): HIV SCREEN 4TH GENERATION: NONREACTIVE

## 2016-03-15 MED ORDER — IBUPROFEN 200 MG PO TABS
600.0000 mg | ORAL_TABLET | Freq: Four times a day (QID) | ORAL | Status: DC | PRN
  Administered 2016-03-15 – 2016-03-20 (×4): 600 mg via ORAL
  Filled 2016-03-15 (×3): qty 3

## 2016-03-15 MED ORDER — SENNOSIDES-DOCUSATE SODIUM 8.6-50 MG PO TABS
1.0000 | ORAL_TABLET | Freq: Two times a day (BID) | ORAL | Status: DC
  Administered 2016-03-15 – 2016-03-20 (×10): 1 via ORAL
  Filled 2016-03-15 (×10): qty 1

## 2016-03-15 MED ORDER — VANCOMYCIN HCL IN DEXTROSE 1-5 GM/200ML-% IV SOLN
1000.0000 mg | Freq: Two times a day (BID) | INTRAVENOUS | Status: DC
  Administered 2016-03-15 – 2016-03-17 (×5): 1000 mg via INTRAVENOUS
  Filled 2016-03-15 (×4): qty 200

## 2016-03-15 MED ORDER — SODIUM CHLORIDE 0.9 % IV SOLN
Freq: Once | INTRAVENOUS | Status: AC
  Administered 2016-03-15: via INTRAVENOUS

## 2016-03-15 MED ORDER — MUPIROCIN 2 % EX OINT
TOPICAL_OINTMENT | CUTANEOUS | Status: AC
  Filled 2016-03-15: qty 22

## 2016-03-15 MED ORDER — PERFLUTREN LIPID MICROSPHERE
1.0000 mL | INTRAVENOUS | Status: AC | PRN
  Administered 2016-03-15: 2 mL via INTRAVENOUS
  Filled 2016-03-15: qty 10

## 2016-03-15 MED ORDER — SODIUM CHLORIDE 0.9 % IV SOLN
INTRAVENOUS | Status: DC
  Administered 2016-03-15 (×3): via INTRAVENOUS

## 2016-03-15 MED ORDER — POLYETHYLENE GLYCOL 3350 17 G PO PACK
17.0000 g | PACK | Freq: Every day | ORAL | Status: DC
  Administered 2016-03-15 – 2016-03-20 (×5): 17 g via ORAL
  Filled 2016-03-15 (×5): qty 1

## 2016-03-15 MED ORDER — VANCOMYCIN HCL 10 G IV SOLR
1500.0000 mg | Freq: Once | INTRAVENOUS | Status: AC
  Administered 2016-03-15: 1500 mg via INTRAVENOUS
  Filled 2016-03-15: qty 1500

## 2016-03-15 MED ORDER — SODIUM CHLORIDE 0.9 % IV BOLUS (SEPSIS)
1000.0000 mL | Freq: Once | INTRAVENOUS | Status: AC
  Administered 2016-03-15: 1000 mL via INTRAVENOUS

## 2016-03-15 MED ORDER — PERFLUTREN LIPID MICROSPHERE
INTRAVENOUS | Status: AC
  Filled 2016-03-15: qty 10

## 2016-03-15 MED ORDER — MUPIROCIN 2 % EX OINT
1.0000 "application " | TOPICAL_OINTMENT | Freq: Two times a day (BID) | CUTANEOUS | Status: AC
  Administered 2016-03-15 – 2016-03-19 (×10): 1 via NASAL

## 2016-03-15 MED ORDER — DAKINS (1/4 STRENGTH) 0.125 % EX SOLN
Freq: Two times a day (BID) | CUTANEOUS | Status: AC
  Administered 2016-03-16 – 2016-03-17 (×5)
  Administered 2016-03-18: 1
  Filled 2016-03-15: qty 473

## 2016-03-15 MED ORDER — CHLORHEXIDINE GLUCONATE CLOTH 2 % EX PADS
6.0000 | MEDICATED_PAD | Freq: Every day | CUTANEOUS | Status: AC
  Administered 2016-03-15 – 2016-03-19 (×4): 6 via TOPICAL

## 2016-03-15 MED ORDER — POTASSIUM CHLORIDE CRYS ER 20 MEQ PO TBCR
40.0000 meq | EXTENDED_RELEASE_TABLET | Freq: Once | ORAL | Status: AC
  Administered 2016-03-15: 40 meq via ORAL
  Filled 2016-03-15: qty 2

## 2016-03-15 NOTE — Significant Event (Addendum)
Hypotension (sepsis) noted with SBPs now running as low as 60s.  Patient also more confused than when admitted from ED.    1) 4L IVF bolus (82ml / kg) ordered and patient put on sepsis pathway. 2) Total UoP from lasix that was given to patient in ED = 1.4L. 3) Will add Vanc coverage given MRSA colonization and SDU / ICU status.

## 2016-03-15 NOTE — Progress Notes (Signed)
Sepsis - Repeat Assessment  Performed at:    03/15/2016, 8:27 AM  Vitals     Blood pressure 104/60 pulse 115, temperature 97.8 F (36.6 C), temperature source Oral, resp. rate 11, height 5' 6.5" (1.689 m), weight 265 lb 6.9 oz (120.4 kg), last menstrual period 08/30/2006, SpO2 100 %.  Heart:     Regular rate and rhythm and Tachycardic  Lungs:    Rales  Capillary Refill:   <2 sec  Peripheral Pulse:   Radial pulse palpable  Skin:     Normal Color   Chesley Mires, MD [place] 03/15/2016, 8:28 AM Pager:  [phone] After 3pm call: [phone]

## 2016-03-15 NOTE — Consult Note (Signed)
PULMONARY / CRITICAL CARE MEDICINE   Name: Kerri Vasquez MRN: [id number] DOB: 05/09/1955    ADMISSION DATE:  03/14/2016 CONSULTATION DATE:  03/15/2016  REFERRING MD:  Dr. Alcario Drought, [facility]  CHIEF COMPLAINT:  Short of breath  HISTORY OF PRESENT ILLNESS:   60 yo female brought to ER with c/o dyspnea for past 2 days.  Her oxygen level was in 70's.  Initial concern was for CHF exacerbation based on CXR appearance.  She was given lasix.  She had elevated D dimer, and then CT angio chest.  This showed 4 cm ascending TAA, 1.2 cm opacity Rt apex, patchy opacity RUL, ASD LUL, patchy ASD RLL, multiple liver metastatic lesions, 8th rt rib fx.  She was started on Abx for pneumonia.  She had elevated lactic acid and low blood pressure.  She required several fluid boluses.  With these her blood pressure and mentation improved.  She feels her breathing is better.  She has a cough and chest congestion, but is not bringing up much sputum.  She denies headache, sinus congestion, difficulty swallowing, sore throat, chest pain, abdominal pain, nausea, or diarrhea.  She has been essential bed bound for the past several months >> it is not clear why this is.  As a result she has developed a sacral decubitus ulcer which was noted to have a strong odor.  She has hx of Stage IV breast cancer, and has been followed by Dr. Marin Olp. In last oncology note from August 2017 it was mentioned that her disease had progressed in spite of therapy, but that she was to continue xeloda.  Unfortunately, due to her other medical conditions she was not able to continue therapy.  She does not recall ever having a discussion about goals of care, or palliative care assessment.  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  PAST SURGICAL HISTORY: She  has a past surgical history that includes Tonsillectomy (1969); Multiple extractions with alveoloplasty (06/08/2011); Gastric bypass (2006); Colectomy (2011); Radiofrequency ablation liver tumor (2011; 01/26/2012); Thyroid lobectomy (02/16/2012); left heart catheterization with coronary angiogram (N/A, 07/14/2014); Cholecystectomy open (06/29/85); Breast implant removal (Left, 2015); Mastectomy (Bilateral, 2008); Breast biopsy (Right, 2008); Breast reconstruction (Bilateral, 2008; 2009); Breast reconstruction (Left, ~ 2010); Cardiac catheterization; and Liver biopsy (04/2015).  Allergies  Allergen Reactions  . Influenza Vac Split [Flu Virus Vaccine] Other (See Comments)    Joint stiffness and renal failure  . Pneumococcal Vaccines Other (See Comments)    "sepsis and renal failure"  . Ritalin [Methylphenidate Hcl]     "Heart attack"  . Zostavax [Zoster Vaccine Live ([employer])] Other (See Comments)    Joint stiffness, renal failure  . Adhesive [Tape] Itching and Rash    No current facility-administered medications on file prior to encounter.    Current Outpatient Prescriptions on File Prior to Encounter  Medication Sig  . cyanocobalamin (,VITAMIN B-12,) 1000 MCG/ML injection INJECT 1 ML AS DIRECTED EVERY 21 DAYS  . diphenoxylate-atropine (LOMOTIL) 2.5-0.025 MG tablet  TAKE 1 TABLET BY MOUTH FOUR TIMES DAILY AS NEEDED FOR DIARRHEA  . fentaNYL (DURAGESIC - DOSED MCG/HR) 100  MCG/HR Place 1 patch (100 mcg total) onto the skin every 3 (three) days.  . fentaNYL (DURAGESIC - DOSED MCG/HR) 25 MCG/HR patch Place 1 patch (25 mcg total) onto the skin every 3 (three) days.  . fludrocortisone (FLORINEF) 0.1 MG tablet TAKE 1 TABLET BY MOUTH DAILY  . FLUoxetine (PROZAC) 40 MG capsule Take 40 mg by mouth daily before breakfast.   . ibuprofen (ADVIL,MOTRIN) 600 MG tablet Take 600 mg by mouth every 8 (eight) hours.   . lidocaine-prilocaine (EMLA) cream Apply 1 application topically as needed for port access  . lipase/protease/amylase 24000-76000 units CPEP Take 1 capsule (24,000 Units total) by mouth 3 (three) times daily before meals.  Marland Kitchen LORazepam (ATIVAN) 0.5 MG tablet PLACE 1 TABLET UNDER TONGUE EVERY 6 HOURS AS NEEDED FOR NAUSEA AND VOMITING  . methocarbamol (ROBAXIN) 750 MG tablet TAKE 1 TABLET BY MOUTH THREE TIMES DAILY AS NEEDED (Patient taking differently: TAKE 1 TABLET BY MOUTH every 8 hours for muscle spasms)  . Multiple Vitamin (MULTI-VITAMINS) TABS Take 1 tablet by mouth daily.  . nitroGLYCERIN (NITROSTAT) 0.4 MG SL tablet Place 1 tablet (0.4 mg total) under the tongue every 5 (five) minutes as needed for chest pain.  Marland Kitchen ondansetron (ZOFRAN) 8 MG tablet TAKE 1 TABLET BY MOUTH EVERY 8 HOURS AS NEEDED FOR NAUSEA AND VOMITING.  Marland Kitchen Oxycodone HCl 20 MG TABS Take 1 tablet (20 mg total) by mouth every 4 (four) hours as needed. (Patient taking differently: Take 20 mg by mouth every 4 (four) hours as needed (for pain). )  . pantoprazole (PROTONIX) 40 MG tablet Take 1 tablet (40 mg total) by mouth 2 (two) times daily.  . polyethylene glycol (MIRALAX / GLYCOLAX) packet Take 17 g by mouth 2 (two) times daily. (Patient taking differently: Take 17 g by mouth daily as needed for mild constipation. )  . pregabalin (LYRICA) 200 MG capsule Take 1 capsule (200 mg total) by mouth 2 (two) times daily.  . prochlorperazine (COMPAZINE) 10 MG tablet TAKE 1 TABLET BY MOUTH EVERY 6 HOURS AS NEEDED FOR  NAUSEA AND VOMITING  . promethazine (PHENERGAN) 25 MG tablet TAKE 1 TABLET BY MOUTH EVERY 8 HOURS AS NEEDED FOR NAUSEA OR VOMITING.  . Suvorexant (BELSOMRA) 20 MG TABS Take 20 mg by mouth at bedtime as needed. (Patient taking differently: Take 20 mg by mouth at bedtime. )  . tiZANidine (ZANAFLEX) 4 MG capsule TAKE 1 CAPSULE BY MOUTH EVERY 8 HOURS (Patient taking differently: TAKE 1 CAPSULE BY MOUTH every night at bedtime)    FAMILY HISTORY:  Her indicated that her mother is alive. She indicated that her father is deceased. She indicated that the status of her daughter is unknown.    SOCIAL HISTORY: She  reports that she has never smoked. She has never used smokeless tobacco. She reports that she does not drink alcohol or use drugs.  REVIEW OF SYSTEMS:   Negative except above.  SUBJECTIVE:  Feels more alert.  VITAL SIGNS: BP (!) 104/0 (BP Location: Left Leg) Comment (BP Location): thigh  Pulse 115   Temp 97.8 F (36.6 C) (Oral)   Resp 11   Ht 5' 6.5" (1.689 m)   Wt 265 lb 6.9 oz (120.4 kg)   LMP 08/30/2006   SpO2 100%   BMI 42.20 kg/m   INTAKE / OUTPUT: I/O last 3 completed shifts: In: 4493.8 [P.O.:600;  I.V.:593.8; IV Piggyback:3300] Out: 1665 [Urine:1665]  PHYSICAL EXAMINATION: General:  pleasant Neuro:  Alert, moves upper extremities, can wiggle toes HEENT:  No sinus tenderness, no LAN, no stridor, raspy voice Cardiovascular:  Regular, no murmur Lungs:  B/l crackles that partially clear with coughing, no wheeze Abdomen:  Soft, non tender, decreased BS Musculoskeletal:  1+ edema Skin:  Stage II pressure wound in sacral area >> present prior to this admission  LABS:  BMET  Recent Labs Lab 03/14/16 1758 03/15/16 0422  NA 147* 145  K 3.5 3.2*  CL 115* 114*  CO2 23 24  BUN 36* 34*  CREATININE 0.81 0.80  GLUCOSE 122* 185*    Electrolytes  Recent Labs Lab 03/14/16 1758 03/15/16 0422  CALCIUM 9.0 7.8*    CBC  Recent Labs Lab 03/14/16 1758  WBC  13.4*  HGB 12.5  HCT 38.1  PLT 149*    Coag's No results for input(s): APTT, INR in the last 168 hours.  Sepsis Markers  Recent Labs Lab 03/15/16 0422  LATICACIDVEN 2.1*  PROCALCITON 0.76    ABG  Recent Labs Lab 03/14/16 1610  PHART 7.425  PCO2ART 33.5  PO2ART 155*    Liver Enzymes  Recent Labs Lab 03/14/16 1758  AST 23  ALT 22  ALKPHOS 80  BILITOT 1.2  ALBUMIN 3.1*    Cardiac Enzymes  Recent Labs Lab 03/15/16 0422  TROPONINI <0.03    Glucose No results for input(s): GLUCAP in the last 168 hours.  Imaging Ct Angio Chest Pe W/cm &/or Wo Cm  Addendum Date: 03/14/2016   ADDENDUM REPORT: 03/14/2016 21:20 ADDENDUM: There is a mildly displaced eighth rib fracture on the right laterally. Electronically Signed   By: Lowella Grip III M.D.   On: 03/14/2016 21:20   Result Date: 03/14/2016 CLINICAL DATA:  Shortness of breath. Breast carcinoma. Tachycardia. EXAM: CT ANGIOGRAPHY CHEST WITH CONTRAST TECHNIQUE: Multidetector CT imaging of the chest was performed using the standard protocol during bolus administration of intravenous contrast. Multiplanar CT image reconstructions and MIPs were obtained to evaluate the vascular anatomy. CONTRAST:  100 mL Isovue 370 nonionic COMPARISON:  Chest CT February 02, 2016; chest radiograph March 14, 2016 FINDINGS: Cardiovascular: There is no demonstrable pulmonary embolus. Ascending thoracic aorta measures 4.0 x 4.0 in transverse diameter; this measurement was obtained on axial slice 38 series 4. There is no demonstrable thoracic aortic dissection. The visualized great vessels appear normal. Note that the right and left common carotid arteries arise as a common trunk, an anatomic variant. There is no appreciable pericardial effusion. Port-A-Cath tip is at the cavoatrial junction. Mediastinum/Nodes: Surgical clips are noted in the right thyroid region consistent with hemi thyroidectomy. No thyroid lesion present currently. Left  lobe thyroid diminutive. There is no appreciable thoracic adenopathy. Lungs/Pleura: There is a focal opacity in the anterior right apex measuring 1.2 x 1.1 cm. Patchy opacity is noted in the right upper lobe with several ill-defined opacities in the right upper lobe, possibly representing small nodular lesions. There is airspace consolidation in a portion of the anterior segment of the left upper lobe. There is ill-defined patchy airspace opacity in the right lower lobe at multiple sites. There is atelectatic change in the left base. There is a small left pleural effusion. Upper Abdomen: In the visualized upper abdomen, liver metastases are again noted without change. The largest of these metastases arises in the anterior segment right lobe measuring 5.2 x 3.0 cm. Visualized upper abdominal structures otherwise appear unremarkable. Musculoskeletal: There  is a breast implant on the right. There is degenerative change in the thoracic spine. No blastic or lytic bone lesions are evident. There is evidence of a prior fracture of the posterior right tenth rib. There is a mildly displaced fracture of the lateral right eighth rib. There are no blastic or lytic bone lesions. Review of the MIP images confirms the above findings. IMPRESSION: No demonstrable pulmonary embolus. Ascending thoracic aorta measures 4.0 x 4.0 cm in diameter. Recommend annual imaging followup by CTA or MRA. This recommendation follows 2010 ACCF/AHA/AATS/ACR/ASA/SCA/SCAI/SIR/STS/SVM Guidelines for the Diagnosis and Management of Patients with Thoracic Aortic Disease. Circulation. 2010; [id number]. No dissection evident. Patchy areas of apparent pneumonia bilaterally. Nodular appearing opacity in the right apex measuring 1.2 x 1.1 cm raises concern for metastatic focus. There may be small metastases in the right upper lobe intermingled with patchy pneumonia. There is a rather minimal pericardial effusion. No appreciable thoracic adenopathy. Multiple  liver metastases. Breast implant on the right. Electronically Signed: By: Lowella Grip III M.D. On: 03/14/2016 21:11   Dg Chest Port 1 View  Result Date: 03/14/2016 CLINICAL DATA:  Shortness of breath today, history breast cancer, CHF, diabetes mellitus, hypertension EXAM: PORTABLE CHEST 1 VIEW COMPARISON:  Portable exam 1638 hours compared 02/02/2016 FINDINGS: RIGHT jugular Port-A-Cath with tip projecting over cavoatrial junction. Enlargement of cardiac silhouette. Mediastinal contours and pulmonary vascularity grossly normal for technique. Peribronchial thickening without infiltrate, pleural effusion or pneumothorax. Diffuse osseous demineralization. IMPRESSION: Enlargement of cardiac silhouette. Mild bronchitic changes without infiltrate. Electronically Signed   By: Lavonia Dana M.D.   On: 03/14/2016 17:05     STUDIES:  CT angio chest 11/14 >> 4 cm ascending TAA, 1.2 cm opacity Rt apex, patchy opacity RUL, ASD LUL, patchy ASD RLL, multiple liver metastatic lesions, 8th rt rib fx  CULTURES: MRSA Screen 11/15 >> positive Blood 11/15 >>   ANTIBIOTICS: Vancomycin 11/14 >> Rocephin 11/14 >> Zithromax 11/14 >>   SIGNIFICANT EVENTS: 11/14 Admit  LINES/TUBES: Port Rt chest >>   DISCUSSION: 60 yo female with acute hypoxic respiratory failure and sepsis from PNA and sacral wound.  She has hx of Stage IV breast cancer that has progressed on therapy, and has not been on therapy for several months.    ASSESSMENT / PLAN:  Severe sepsis 2nd to PNA, and sacral decubitus ulcer >> improved. - continue IV fluid - day 2 of Abx - wound care consulted  Acute hypoxic respiratory failure 2nd to PNA. - oxygen to keep SpO2 > 92% - f/u CXR intermittently  Acute encephalopathy 2nd to sepsis and hypoxia >> improved. - continue to optimize oxygenation and hemodynamics  Stage IV Breast cancer. - f/u with Dr. Marin Olp as outpt  Bed bound. - cause of her weakness not clearly defined - per  primary team  DVT prophylaxis - lovenox SUP - not indicated Goals of care - Full code.  D/w pt option of palliative care assessment, but she did not feel this was necessary and she seemed optimistic that should could still improve.    PCCM can be available as needed if her clinical status declines.  Chesley Mires, MD [facility] Pulmonary/Critical Care 03/15/2016, 8:19 AM Pager:  [phone] After 3pm call: [phone]

## 2016-03-15 NOTE — Significant Event (Addendum)
Despite electronic BP cuff continuing to read 0000000 to [id number] systolic.  A manual BP with doppler on patients popliteal artery clearly has an audible systolic of just over [id number]!  Due to patients habitus, BUE lymphedema from prior mastectomies, and BLE peripheral edema feel that this is why cuff BPs are not being read accurately.  Will ask PCCM if A-line is reasonable for patient.  Also of note, 2L IVF in, lactate has just come back at 2.1, and most importantly, renal function has just come back and appears unchanged overnight despite the low BPs.  Spoke with PCCM, they plan on evaluating / placing art line when day shift comes on at 7am.

## 2016-03-15 NOTE — Progress Notes (Signed)
eLink Physician-Brief Progress Note Patient Name: Milagro Weisbecker DOB: 1956-03-04 MRN: [id number]   Date of Service  03/15/2016  HPI/Events of Note  60 yo F with complex medical hx including breast ca w/ B/L mastectomies, lymphedema, now with SOB, CTA chest with multifocal PNA, and new RUL opacity,  Noted to have hypotension and sepsis 2/2 Multifocal PNA.  Pt confused, SBP in 60s her electronic measurement, but manual reading with doppler SBP ~ 100. PCCM asked to eval pt for sepsis and consideration of a-line  eICU Interventions  Plan Cont with current ABx Gentle IVF Limit benzos/opioids  PCT elevated F/u cultures Full consult note to follow     Intervention Category Evaluation Type: New Patient Evaluation  Arrick Dutton 03/15/2016, 5:28 AM

## 2016-03-15 NOTE — Care Management Note (Signed)
Case Management Note  Patient Details  Name: Tyshawna Stottlemyre MRN: [id number] Date of Birth: [birth year]  Subjective/Objective:      sepsis              Action/Plan:Date:  March 15, 2016 Chart reviewed for concurrent status and case management needs. Will continue to follow patient progress. Discharge Planning: following for needs Expected discharge date: [id number] Rhonda Davis, BSN, [place], [place]   Expected Discharge Date:   (unknown)               Expected Discharge Plan:  [place]  In-House Referral:     Discharge planning Services  CM Consult  Post Acute Care Choice:  Resumption of Svcs/PTA Provider Choice offered to:  Patient  DME Arranged:    DME Agency:     HH Arranged:  Disease Management [place] Agency:  [place]  Status of Service:  In process, will continue to follow  If discussed at Long Length of Stay Meetings, dates discussed:    Additional Comments:  Leeroy Cha, RN 03/15/2016, 9:38 AM

## 2016-03-15 NOTE — Progress Notes (Signed)
Echocardiogram 2D Echocardiogram has been performed with definity.  Aggie Cosier 03/15/2016, 1:49 PM

## 2016-03-15 NOTE — Progress Notes (Signed)
Pt refused CPAP qhs.  Pt educated but states she had a sleep study many years ago but has never worn a CPAP.  RT will continue to monitor as needed.

## 2016-03-15 NOTE — Progress Notes (Signed)
Pharmacy Antibiotic Note  Kerri Vasquez is a 60 y.o. female admitted on 03/14/2016 with pneumonia and sepsis.  Pharmacy has been consulted for Vancomycin dosing.  First dose of antibiotics already given.  Plan: Vancomycin 1gm IV every 12 hours.  Goal trough 15-20 mcg/mL.  Height: 5' 6.5" (168.9 cm) Weight: 265 lb 6.9 oz (120.4 kg) IBW/kg (Calculated) : 60.45  Temp (24hrs), Avg:97.7 F (36.5 C), Min:97.5 F (36.4 C), Max:98 F (36.7 C)   Recent Labs Lab 03/14/16 1758  WBC 13.4*  CREATININE 0.81    Estimated Creatinine Clearance: 97.3 mL/min (by C-G formula based on SCr of 0.81 mg/dL).    Allergies  Allergen Reactions  . Influenza Vac Split [Flu Virus Vaccine] Other (See Comments)    Joint stiffness and renal failure  . Pneumococcal Vaccines Other (See Comments)    "sepsis and renal failure"  . Ritalin [Methylphenidate Hcl]     "Heart attack"  . Zostavax [Zoster Vaccine Live ([employer])] Other (See Comments)    Joint stiffness, renal failure  . Adhesive [Tape] Itching and Rash    Antimicrobials this admission: Ceftriaxone 11/15 >> Azithromycin 11/15 >> Vancomycin 11/15 >>  Dose adjustments this admission: -  Microbiology results: Pending  Thank you for allowing pharmacy to be a part of this patient's care.  Nani Skillern [place]ford 03/15/2016 3:35 AM

## 2016-03-15 NOTE — Progress Notes (Signed)
PROGRESS NOTE  Kerri Vasquez [id number] DOB: 1956-02-01 DOA: 03/14/2016 PCP: [name], MD  HPI/Recap of past 24 hours:  bp better this am, she is aaox3,  she is on 4liter oxygen, still some congested cough, denies pain Chronic bilateral lower extremity pitting edema, right upper extremity edema, hands no edema Draining sacral decubitus ulcer  Assessment/Plan: Principal Problem:   Acute respiratory failure with hypoxia ([place]) Active Problems:   Breast cancer metastasized to liver ([facility])   Chronic diastolic CHF (congestive heart failure) ([place])   Tachycardia   Decubitus ulcer of sacral region, stage 3 ([place])   CAP (community acquired pneumonia)   Sepsis ([place])   Acute hypoxic respiratory failure: CTA no PE, troponin negative,  actue hypoxic respiratory failure Likely from sepsis/bilateral pna and diastolic chf exacerbation, sedation from opiods analgesia could also contribute, patient reports she has been feeling sob for the last two days prior to coming to the hospital  per EMS, 02 dropped to 70's (pateint does not require o2 supplement at baseline ), she was put on cpap by EMS, currently on 4liter, Wean as tolerated  Hypotension:  Likely from sepsis, but patient does has low bp during last hospitalization, she was put on florinef Treat sepsis, continue florinef, avoid sedation   Bilateral pna / sepsis with hypotension, leukocytosis, lactic acidosis, hypoxia on presentation: mrsa screening positive, blood culture collected , result pending, sputum culture collected, result pending, wound swab  Patient also has several unstageble decubitus ulcers, the one on sacrum with malodorous drainage, general surgery consulted wean oxygen, continue abx  With vanc/rocephin/zithro,avoid sedation   Acute on chronic diastolic chf exacerbation:  She was discharged on lasix 40mg  po qd a month ago, she is taken off coreg from last hospitalization due to  hypotension. Lasix held since admission, she received ivf due to sepsis She has chronic generalized edema, likely combination of diastolic chf and low albumin state Close monitor volume status, resume diuresis when bp stable   Hypothyroidism: patient in the past noncompliant with synthroid supplement, continue synthroid, repeat tsh  Chronic pain: she was discharged on fentanyl 44mcg q72hr a month ago, she report take 12mcg instead. she is also on oxycodone 20mg  q4hrs prn ( she was discharged on 15mg  q4hr prn a month ago),  on lyrica, zanaflex at bedtime, ibuprogen   Constipation: with chronic pain meds, bedridden status and hypothyroidism, patient is prone to constipation, start sennakot and miralax   Metastatic breast cancer, with liver mets and possible mets to omentum, she has progressive weakness, has been bedridden for the last two months, she is off chemotherapy, oncology Dr Martha Clan  Consulted  Sacral decubitus ulcer presented on admission  Wound care consulted "Wound type:stage IV sacral wound, unstageable R lateral ankle and R lateral leg just proximal to ankle, stage I R lateral calf Pressure Ulcer POA: Yes Measurement:sacrum 6cm x 7.5cm x 6cm 60% black, 40% red granulating tissue, small amt drainage, malodorous (consistant with necrotic tissue) R lateral ankle 0.5cm x 0.5cm x 0.2cm unstageable 100% slough no odor, small amt drainage R lateral leg 0.5cm x 0.5cm x 0.1cm 100% slough, no drainage, or odor Right lateral lower leg stage I 7cm x 2cm  Left lateral ankle 1cm x 2cm unstageable, 100% slough no drainage or odor Wound bed:see above Drainage (amount, consistency, odor) see above Periwound:see above  Dressing procedure/placement/frequency:Pt is on a therapeutic mattress with low air loss feature and will require this support surface when transfers to floor.  Staff provided with  guidance to minimize layers of bed linens. Recommend surgical consult for unstageable sacral  ulcer. If you agree please order. In the meantime we will begin hydrotherapy daily followed by 3 days of twice daily dressings with sodium hypochlorite (Dakins, quarter percent). Pressure redistribution heel boots have been in use at home and will be continued here. Nutritional consult ordered for increased requirements for tissue repair and regeneration."  General surgery consulted  morbid obesity:  Body mass index is 42.2 kg/m.   FTT, bedridden , from home  Code Status: Full  Family Communication: patient   Disposition Plan: pending ( patient is from home, bed ridden, daughter has refused snf in the past)   Consultants:  Critical care  Oncology  ]general surgery  Procedures:  none  Antibiotics:  Vanc/rocephin/zithro   Objective: BP (!) 104/0 (BP Location: Left Leg) Comment (BP Location): thigh  Pulse 115   Temp 97.8 F (36.6 C) (Oral)   Resp 11   Ht 5' 6.5" (1.689 m)   Wt 120.4 kg (265 lb 6.9 oz)   LMP 08/30/2006   SpO2 100%   BMI 42.20 kg/m   Intake/Output Summary (Last 24 hours) at 03/15/16 0758 Last data filed at 03/15/16 0615  Gross per 24 hour  Intake          4493.75 ml  Output             1665 ml  Net          2828.75 ml   Filed Weights   03/14/16 1547 03/14/16 2200  Weight: 126.1 kg (278 lb) 120.4 kg (265 lb 6.9 oz)    Exam:   General:  Obese, chronically ill  Cardiovascular: RRR  Respiratory: diminished at basis  Abdomen: Soft/ND/NT, positive BS  Musculoskeletal: chronic right upper extremity Edema ( lymphodema from breast cancer surgery), bilateral lower extremity pitting edema, able to move toes and attempt to lift both legs off the bed  Neuro: aaox3  Data Reviewed: Basic Metabolic Panel:  Recent Labs Lab 03/14/16 1758 03/15/16 0422  NA 147* 145  K 3.5 3.2*  CL 115* 114*  CO2 23 24  GLUCOSE 122* 185*  BUN 36* 34*  CREATININE 0.81 0.80  CALCIUM 9.0 7.8*   Liver Function Tests:  Recent Labs Lab 03/14/16 1758   AST 23  ALT 22  ALKPHOS 80  BILITOT 1.2  PROT 6.7  ALBUMIN 3.1*   No results for input(s): LIPASE, AMYLASE in the last 168 hours. No results for input(s): AMMONIA in the last 168 hours. CBC:  Recent Labs Lab 03/14/16 1758  WBC 13.4*  NEUTROABS 11.9*  HGB 12.5  HCT 38.1  MCV 100.0  PLT 149*   Cardiac Enzymes:    Recent Labs Lab 03/15/16 0422  TROPONINI <0.03   BNP (last 3 results)  Recent Labs  03/14/16 1758  BNP 162.1*    ProBNP (last 3 results) No results for input(s): PROBNP in the last 8760 hours.  CBG: No results for input(s): GLUCAP in the last 168 hours.  Recent Results (from the past 240 hour(s))  MRSA PCR Screening     Status: Abnormal   Collection Time: 03/14/16  9:30 PM  Result Value Ref Range Status   MRSA by PCR POSITIVE (A) NEGATIVE Final    Comment:        The GeneXpert MRSA Assay (FDA approved for NASAL specimens only), is one component of a comprehensive MRSA colonization surveillance program. It is not intended to diagnose MRSA infection nor  to guide or monitor treatment for MRSA infections. RESULT CALLED TO, READ BACK BY AND VERIFIED WITH: [facility] RN [phone] [id number] [place],N      Studies: Ct Angio Chest Pe W/cm &/or Wo Cm  Addendum Date: 03/14/2016   ADDENDUM REPORT: 03/14/2016 21:20 ADDENDUM: There is a mildly displaced eighth rib fracture on the right laterally. Electronically Signed   By: Lowella Grip III M.D.   On: 03/14/2016 21:20   Result Date: 03/14/2016 CLINICAL DATA:  Shortness of breath. Breast carcinoma. Tachycardia. EXAM: CT ANGIOGRAPHY CHEST WITH CONTRAST TECHNIQUE: Multidetector CT imaging of the chest was performed using the standard protocol during bolus administration of intravenous contrast. Multiplanar CT image reconstructions and MIPs were obtained to evaluate the vascular anatomy. CONTRAST:  100 mL Isovue 370 nonionic COMPARISON:  Chest CT February 02, 2016; chest radiograph March 14, 2016 FINDINGS:  Cardiovascular: There is no demonstrable pulmonary embolus. Ascending thoracic aorta measures 4.0 x 4.0 in transverse diameter; this measurement was obtained on axial slice 38 series 4. There is no demonstrable thoracic aortic dissection. The visualized great vessels appear normal. Note that the right and left common carotid arteries arise as a common trunk, an anatomic variant. There is no appreciable pericardial effusion. Port-A-Cath tip is at the cavoatrial junction. Mediastinum/Nodes: Surgical clips are noted in the right thyroid region consistent with hemi thyroidectomy. No thyroid lesion present currently. Left lobe thyroid diminutive. There is no appreciable thoracic adenopathy. Lungs/Pleura: There is a focal opacity in the anterior right apex measuring 1.2 x 1.1 cm. Patchy opacity is noted in the right upper lobe with several ill-defined opacities in the right upper lobe, possibly representing small nodular lesions. There is airspace consolidation in a portion of the anterior segment of the left upper lobe. There is ill-defined patchy airspace opacity in the right lower lobe at multiple sites. There is atelectatic change in the left base. There is a small left pleural effusion. Upper Abdomen: In the visualized upper abdomen, liver metastases are again noted without change. The largest of these metastases arises in the anterior segment right lobe measuring 5.2 x 3.0 cm. Visualized upper abdominal structures otherwise appear unremarkable. Musculoskeletal: There is a breast implant on the right. There is degenerative change in the thoracic spine. No blastic or lytic bone lesions are evident. There is evidence of a prior fracture of the posterior right tenth rib. There is a mildly displaced fracture of the lateral right eighth rib. There are no blastic or lytic bone lesions. Review of the MIP images confirms the above findings. IMPRESSION: No demonstrable pulmonary embolus. Ascending thoracic aorta measures 4.0  x 4.0 cm in diameter. Recommend annual imaging followup by CTA or MRA. This recommendation follows 2010 ACCF/AHA/AATS/ACR/ASA/SCA/SCAI/SIR/STS/SVM Guidelines for the Diagnosis and Management of Patients with Thoracic Aortic Disease. Circulation. 2010; [id number]. No dissection evident. Patchy areas of apparent pneumonia bilaterally. Nodular appearing opacity in the right apex measuring 1.2 x 1.1 cm raises concern for metastatic focus. There may be small metastases in the right upper lobe intermingled with patchy pneumonia. There is a rather minimal pericardial effusion. No appreciable thoracic adenopathy. Multiple liver metastases. Breast implant on the right. Electronically Signed: By: Lowella Grip III M.D. On: 03/14/2016 21:11   Dg Chest Port 1 View  Result Date: 03/14/2016 CLINICAL DATA:  Shortness of breath today, history breast cancer, CHF, diabetes mellitus, hypertension EXAM: PORTABLE CHEST 1 VIEW COMPARISON:  Portable exam 1638 hours compared 02/02/2016 FINDINGS: RIGHT jugular Port-A-Cath with tip projecting over cavoatrial junction. Enlargement of  cardiac silhouette. Mediastinal contours and pulmonary vascularity grossly normal for technique. Peribronchial thickening without infiltrate, pleural effusion or pneumothorax. Diffuse osseous demineralization. IMPRESSION: Enlargement of cardiac silhouette. Mild bronchitic changes without infiltrate. Electronically Signed   By: Lavonia Dana M.D.   On: 03/14/2016 17:05    Scheduled Meds: . azithromycin  500 mg Intravenous Q24H  . cefTRIAXone (ROCEPHIN)  IV  1 g Intravenous Q24H  . Chlorhexidine Gluconate Cloth  6 each Topical Q0600  . enoxaparin (LOVENOX) injection  40 mg Subcutaneous Q24H  . [START ON 03/16/2016] fentaNYL  100 mcg Transdermal Q72H  . fludrocortisone  100 mcg Oral Daily  . FLUoxetine  40 mg Oral Daily  . ibuprofen  600 mg Oral Q8H  . levothyroxine  200 mcg Oral QAC breakfast  . lipase/protease/amylase  24,000 Units Oral TID  AC  . mouth rinse  15 mL Mouth Rinse BID  . multivitamin with minerals  1 tablet Oral Daily  . mupirocin ointment  1 application Nasal BID  . pantoprazole  40 mg Oral BID  . pregabalin  200 mg Oral BID  . sodium chloride flush  3 mL Intravenous Q12H  . tiZANidine  4 mg Oral QHS  . vancomycin  1,000 mg Intravenous Q12H    Continuous Infusions: . sodium chloride 125 mL/hr at 03/15/16 0741     Time spent: 38mins  Auria Mckinlay MD, PhD  [facility] Pager [phone]. If 7PM-7AM, please contact night-coverage at [email or web address], password [facility] 03/15/2016, 7:58 AM  LOS: 1 day

## 2016-03-15 NOTE — Consult Note (Signed)
[facility] Nurse wound consult note: seen today with Maudie Flakes, [place] nurse Reason for Consult:pressure ulcers Wound type:stage IV sacral wound, unstageable R lateral ankle and R lateral leg just proximal to ankle, stage I R lateral calf Pressure Ulcer POA: Yes Measurement:sacrum 6cm x 7.5cm x 6cm 60% black, 40% red granulating tissue, small amt drainage, malodorous (consistant with necrotic tissue) R lateral ankle 0.5cm x 0.5cm x 0.2cm unstageable 100% slough no odor, small amt drainage R lateral leg 0.5cm x 0.5cm x 0.1cm 100% slough, no drainage, or odor Right lateral lower leg stage I 7cm x 2cm  Left lateral ankle 1cm x 2cm unstageable, 100% slough no drainage or odor Wound bed:see above Drainage (amount, consistency, odor) see above Periwound:see above  Dressing procedure/placement/frequency:Pt is on a therapeutic mattress with low air loss feature and will require this support surface when transfers to floor.  Staff provided with guidance to minimize layers of bed linens. Recommend surgical consult for unstageable sacral ulcer. If you agree please order. In the meantime we will begin hydrotherapy daily followed by 3 days of twice daily dressings with sodium hypochlorite (Dakins, quarter percent). Pressure redistribution heel boots have been in use at home and will be continued here. Nutritional consult ordered for increased requirements for tissue repair and regeneration.   We will follow along with you but not daily.  Please re-consult if needed in between visits.      Fara Olden, RN-C, WTA-C Wound Treatment Associate

## 2016-03-16 DIAGNOSIS — R52 Pain, unspecified: Secondary | ICD-10-CM

## 2016-03-16 DIAGNOSIS — L8990 Pressure ulcer of unspecified site, unspecified stage: Secondary | ICD-10-CM

## 2016-03-16 DIAGNOSIS — C50919 Malignant neoplasm of unspecified site of unspecified female breast: Secondary | ICD-10-CM

## 2016-03-16 DIAGNOSIS — R0602 Shortness of breath: Secondary | ICD-10-CM

## 2016-03-16 DIAGNOSIS — R627 Adult failure to thrive: Secondary | ICD-10-CM

## 2016-03-16 DIAGNOSIS — R531 Weakness: Secondary | ICD-10-CM

## 2016-03-16 DIAGNOSIS — C7951 Secondary malignant neoplasm of bone: Secondary | ICD-10-CM

## 2016-03-16 LAB — BASIC METABOLIC PANEL
Anion gap: 5 (ref 5–15)
BUN: 26 mg/dL — AB (ref 6–20)
CHLORIDE: 117 mmol/L — AB (ref 101–111)
CO2: 22 mmol/L (ref 22–32)
CREATININE: 0.64 mg/dL (ref 0.44–1.00)
Calcium: 7.6 mg/dL — ABNORMAL LOW (ref 8.9–10.3)
GFR calc Af Amer: >60 mL/min (ref >60)
GLUCOSE: 97 mg/dL (ref 65–99)
POTASSIUM: 3.2 mmol/L — AB (ref 3.5–5.1)
SODIUM: 144 mmol/L (ref 135–145)

## 2016-03-16 LAB — CBC WITH DIFFERENTIAL/PLATELET
Basophils Absolute: 0 10*3/uL (ref 0.0–0.1)
Basophils Relative: 0 %
EOS ABS: 0.3 10*3/uL (ref 0.0–0.7)
EOS PCT: 3 %
HCT: 28.3 % — ABNORMAL LOW (ref 36.0–46.0)
Hemoglobin: 9.2 g/dL — ABNORMAL LOW (ref 12.0–15.0)
LYMPHS ABS: 0.9 10*3/uL (ref 0.7–4.0)
Lymphocytes Relative: 8 %
MCH: 33.1 pg (ref 26.0–34.0)
MCHC: 32.5 g/dL (ref 30.0–36.0)
MCV: 101.8 fL — AB (ref 78.0–100.0)
MONO ABS: 0.6 10*3/uL (ref 0.1–1.0)
Monocytes Relative: 5 %
Neutro Abs: 9.5 10*3/uL — ABNORMAL HIGH (ref 1.7–7.7)
Neutrophils Relative %: 84 %
PLATELETS: 121 10*3/uL — AB (ref 150–400)
RBC: 2.78 MIL/uL — ABNORMAL LOW (ref 3.87–5.11)
RDW: 16.3 % — AB (ref 11.5–15.5)
WBC: 11.2 10*3/uL — AB (ref 4.0–10.5)

## 2016-03-16 LAB — HEPATIC FUNCTION PANEL
ALK PHOS: 65 U/L (ref 38–126)
ALT: 19 U/L (ref 14–54)
AST: 22 U/L (ref 15–41)
Albumin: 2.5 g/dL — ABNORMAL LOW (ref 3.5–5.0)
BILIRUBIN DIRECT: 0.1 mg/dL (ref 0.1–0.5)
BILIRUBIN INDIRECT: 0.6 mg/dL (ref 0.3–0.9)
BILIRUBIN TOTAL: 0.7 mg/dL (ref 0.3–1.2)
Total Protein: 5.6 g/dL — ABNORMAL LOW (ref 6.5–8.1)

## 2016-03-16 LAB — IRON AND TIBC
Iron: 27 ug/dL — ABNORMAL LOW (ref 28–170)
Saturation Ratios: 21 % (ref 10.4–31.8)
TIBC: 129 ug/dL — ABNORMAL LOW (ref 250–450)
UIBC: 102 ug/dL

## 2016-03-16 LAB — TSH: TSH: 2.219 u[IU]/mL (ref 0.350–4.500)

## 2016-03-16 LAB — PREALBUMIN: Prealbumin: 9.9 mg/dL — ABNORMAL LOW (ref 18–38)

## 2016-03-16 LAB — FERRITIN: FERRITIN: 3731 ng/mL — AB (ref 11–307)

## 2016-03-16 LAB — MAGNESIUM: MAGNESIUM: 1.7 mg/dL (ref 1.7–2.4)

## 2016-03-16 MED ORDER — SODIUM CHLORIDE 0.9 % IV BOLUS (SEPSIS)
500.0000 mL | Freq: Once | INTRAVENOUS | Status: AC
  Administered 2016-03-16: 500 mL via INTRAVENOUS

## 2016-03-16 MED ORDER — HYDROMORPHONE HCL 1 MG/ML IJ SOLN
1.0000 mg | Freq: Once | INTRAMUSCULAR | Status: AC
  Administered 2016-03-16: 1 mg via INTRAVENOUS

## 2016-03-16 MED ORDER — FUROSEMIDE 10 MG/ML IJ SOLN
40.0000 mg | Freq: Every day | INTRAMUSCULAR | Status: DC
  Administered 2016-03-17: 40 mg via INTRAVENOUS
  Filled 2016-03-16: qty 4

## 2016-03-16 MED ORDER — FUROSEMIDE 10 MG/ML IJ SOLN
40.0000 mg | Freq: Once | INTRAMUSCULAR | Status: AC
  Administered 2016-03-16: 40 mg via INTRAVENOUS
  Filled 2016-03-16: qty 4

## 2016-03-16 MED ORDER — POTASSIUM CHLORIDE CRYS ER 20 MEQ PO TBCR
40.0000 meq | EXTENDED_RELEASE_TABLET | Freq: Once | ORAL | Status: AC
  Administered 2016-03-16: 40 meq via ORAL
  Filled 2016-03-16: qty 2

## 2016-03-16 MED ORDER — HYDROMORPHONE HCL 1 MG/ML IJ SOLN
INTRAMUSCULAR | Status: AC
  Filled 2016-03-16: qty 1

## 2016-03-16 MED ORDER — BISACODYL 10 MG RE SUPP: 10.0000 mg | Freq: Every day | RECTAL | Status: AC

## 2016-03-16 MED ORDER — GUAIFENESIN ER 600 MG PO TB12
600.0000 mg | ORAL_TABLET | Freq: Two times a day (BID) | ORAL | Status: DC
  Administered 2016-03-16 – 2016-03-20 (×7): 600 mg via ORAL
  Filled 2016-03-16 (×7): qty 1

## 2016-03-16 NOTE — Progress Notes (Signed)
Patient ID: Kerri Vasquez, female   DOB: Sep 04, 1955, 60 y.o.   MRN: [id number]  [facility]  Patient seen and examined.  Agree with note by Melina Modena, PAC.  Epic will not let me attest or co-sign the note.  Patient with extensive necrotic sacral decubitus ulcer.  See photo.  Plan operative debridement under anesthesia tomorrow, likely with pulse lavage after excision.  Will plan to pack wound open.  Continue abx.  Pre-op orders written.  Discussed with patient who agrees to proceed tomorrow.  The risks and benefits of the procedure have been discussed at length with the patient.  The patient understands the proposed procedure, potential alternative treatments, and the course of recovery to be expected.  All of the patient's questions have been answered at this time.  The patient wishes to proceed with surgery.  Earnstine Regal, MD, [facility] Office: [phone]

## 2016-03-16 NOTE — Consult Note (Addendum)
Referral MD  Reason for Referral: Metastatic breast cancer; slow healing decubitus ulcer; overall poor performance status   Chief Complaint  Patient presents with  . Shortness of Breath  : I just kept getting weaker at home.  HPI: [name] is well-known to me. She is a 60 year old white female. She has metastatic breast cancer. She has been on about 3 or 4 different lines of therapy. Her last course of therapy was with Abraxane. This was stopped back in August because of progression. We then tried her on oral Xeloda which she tolerated incredibly poorly.  Her problem has been overall failure to thrive and poor performance status. She really has been bedbound because of severe, severe arthritis. She has no metastatic disease to the bones. Her disease basically has been in the liver. She may have some lung nodules.  She has multiple decubitus ulcers. Largest is on the sacrum area and she also some on her legs.  She is now being seen by wound care nurse.  She was admitted a day or so ago because of progressive failure to thrive at home. She has a lot of pain issues. She was having some shortness of breath. She had a CT angiogram done. This did not show any pulmonary embolism. She may have had pulmonary infiltrates consistent with pneumonia. She now is on some antibiotics.  Again, her metastatic breast cancer has not been treated because of her poor performance status. I think this is a hard concept for her to understand. I told her that as long she has open skin wounds, that we cannot treat her with chemotherapy as this would make things worse for her and I just cannot do this to her.  Her last CA 27.29 about a month ago was 450. We will see what it is now.  I'm unsure how much she is eating. I'm checking a prealbumin on her.  There's been no obvious bleeding. She has had anemia which is being chronic..   her labs today show her white cell count to be 11.2. Hemoglobin 9.2. Platelet count  121,000. Her potassium is 3.2. Calcium is 7.6. LFTs were not done.    overall, I'll have to set her performance status is no better than ECOG 3.    She has been hospitalized on several occasions. Her big Diagnosis Date  . Anemia   . Breast cancer, right breast ([place]) dx'd 2008   right  . CHF (congestive heart failure) ([place])   . Chronic lower back pain   . Depression   . Diabetes mellitus    type II, controlled by diet 05/25/11   . Fibromyalgia   . Fracture of multiple toes    right toes x 4 toes- excluding small toe  . GERD (gastroesophageal reflux disease)   . Hypertension   . Hypothyroidism   . Incisional hernia   . Intestinal obstruction   . Iron deficiency anemia due to chronic blood loss 07/29/2015  . 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 Migraine    "weekly; but they don't last long" (08/26/2014)  . Myocardial infarction 06/2014   S/P percutaneous intervention procedure a metastatic lesion in the liver  . Nausea and vomiting 04/2015  . Osteoarthritis of multiple joints   . Pernicious anemia 05/30/2011   Iron transfusion and VB12 on 06/06/11  . Renal insufficiency    hx renal failure 2011  . Sciatica   . Scoliosis   . Septic arthritis ([place])  01/28/10   and spinal stenosis , moderate scoliosis   . Sleep apnea    hx of off machine x 4 years   . Spinal stenosis   . Thyroid cancer [facility]) 2013  :  Past Surgical History:  Procedure Laterality Date  . BREAST BIOPSY Right 2008  . BREAST IMPLANT REMOVAL Left 2015  . BREAST RECONSTRUCTION Bilateral 2008; 2009   "w/the mastectomy; "  . BREAST RECONSTRUCTION Left ~ 2010   "it capsized"  . CARDIAC CATHETERIZATION    . CHOLECYSTECTOMY OPEN  06/29/85  . COLECTOMY  2011   "SBO"  . GASTRIC BYPASS  2006   "w/scope"  . LEFT HEART CATHETERIZATION WITH CORONARY ANGIOGRAM N/A 07/14/2014   Procedure: LEFT HEART CATHETERIZATION WITH CORONARY ANGIOGRAM;  Surgeon: Leonie Man, MD;  Location: [facility] CATH LAB;   Service: Cardiovascular;  Laterality: N/A;  . LIVER BIOPSY  04/2015  . MASTECTOMY Bilateral 2008  . MULTIPLE EXTRACTIONS WITH ALVEOLOPLASTY  06/08/2011   Procedure: MULTIPLE EXTRACION WITH ALVEOLOPLASTY;  Surgeon: Lenn Cal, DDS;  Location: [facility] ORS;  Service: Oral Surgery;  Laterality: N/A;  Extraction of tooth #'s 3,4,7 with alveoloplasty and Gross Debridement of Remaining Teeth  . [place] LIVER TUMOR  2011; 01/26/2012  . THYROID LOBECTOMY  02/16/2012   Procedure: THYROID LOBECTOMY;  Surgeon: Earnstine Regal, MD;  Location: [facility] ORS;  Service: General;  Laterality: Right;  . TONSILLECTOMY  1969  :   Current Facility-Administered Medications:  .  0.9 %  sodium chloride infusion, , Intravenous, Continuous, Etta Quill, DO, Last Rate: 125 mL/hr at 03/16/16 0600 .  acetaminophen (TYLENOL) tablet 650 mg, 650 mg, Oral, Q4H PRN, Etta Quill, DO .  azithromycin (ZITHROMAX) 500 mg in dextrose 5 % 250 mL IVPB, 500 mg, Intravenous, Q24H, Jared M Gardner, DO, 500 mg at 03/15/16 2351 .  cefTRIAXone (ROCEPHIN) 1 g in dextrose 5 % 50 mL IVPB, 1 g, Intravenous, Q24H, Jared M Gardner, DO, 1 g at 03/15/16 2256 .  Chlorhexidine Gluconate Cloth 2 % PADS 6 each, 6 each, Topical, Q0600, Etta Quill, DO, 6 each at 03/15/16 1000 .  enoxaparin (LOVENOX) injection 40 mg, 40 mg, Subcutaneous, Q24H, Jared M Gardner, DO, 40 mg at 03/15/16 2257 .  fentaNYL (DURAGESIC - dosed mcg/hr) patch 100 mcg, 100 mcg, Transdermal, Q72H, Jared M Gardner, DO .  fludrocortisone (FLORINEF) tablet 100 mcg, 100 mcg, Oral, Daily, Etta Quill, DO, 100 mcg at 03/15/16 0930 .  FLUoxetine (PROZAC) capsule 40 mg, 40 mg, Oral, Daily, Etta Quill, DO, 40 mg at 03/15/16 0931 .  ibuprofen (ADVIL,MOTRIN) tablet 600 mg, 600 mg, Oral, Q6H PRN, Chesley Mires, MD, 600 mg at 03/15/16 0826 .  levothyroxine (SYNTHROID, LEVOTHROID) tablet 200 mcg, 200 mcg, Oral, QAC breakfast, Etta Quill, DO, 200 mcg at 03/15/16 [id number] .   lipase/protease/amylase (CREON) capsule 24,000 Units, 24,000 Units, Oral, TID AC, Etta Quill, DO, 24,000 Units at 03/15/16 1628 .  LORazepam (ATIVAN) tablet 0.5 mg, 0.5 mg, Oral, Q6H PRN, Etta Quill, DO .  MEDLINE mouth rinse, 15 mL, Mouth Rinse, BID, Jared M Gardner, DO, 15 mL at 03/15/16 1000 .  methocarbamol (ROBAXIN) tablet 750 mg, 750 mg, Oral, TID PRN, Etta Quill, DO .  multivitamin with minerals tablet 1 tablet, 1 tablet, Oral, Daily, Etta Quill, DO, 1 tablet at 03/15/16 0931 .  mupirocin ointment (BACTROBAN) 2 % 1 application, 1 application, Nasal, BID, Etta Quill, DO, 1 application  at 03/15/16 2305 .  ondansetron (ZOFRAN) injection 4 mg, 4 mg, Intravenous, Q6H PRN, Etta Quill, DO .  ondansetron [facility]) tablet 8 mg, 8 mg, Oral, Q8H PRN, Etta Quill, DO .  oxyCODONE (Oxy IR/ROXICODONE) immediate release tablet 20 mg, 20 mg, Oral, Q4H PRN, Etta Quill, DO, 20 mg at 03/16/16 0322 .  pantoprazole (PROTONIX) EC tablet 40 mg, 40 mg, Oral, BID, Etta Quill, DO, 40 mg at 03/15/16 2256 .  polyethylene glycol (MIRALAX / GLYCOLAX) packet 17 g, 17 g, Oral, Daily PRN, Etta Quill, DO .  polyethylene glycol (MIRALAX / GLYCOLAX) packet 17 g, 17 g, Oral, Daily, Florencia Reasons, MD, 17 g at 03/15/16 1000 .  pregabalin (LYRICA) capsule 200 mg, 200 mg, Oral, BID, Etta Quill, DO, 200 mg at 03/15/16 2255 .  prochlorperazine (COMPAZINE) tablet 10 mg, 10 mg, Oral, Q6H PRN, Etta Quill, DO .  promethazine (PHENERGAN) tablet 25 mg, 25 mg, Oral, Q8H PRN, Etta Quill, DO .  senna-docusate (Senokot-S) tablet 1 tablet, 1 tablet, Oral, BID, Florencia Reasons, MD, 1 tablet at 03/15/16 2256 .  sodium chloride flush (NS) 0.9 % injection 10-40 mL, 10-40 mL, Intracatheter, PRN, Etta Quill, DO .  sodium hypochlorite (DAKIN'S 1/4 STRENGTH) topical solution, , Irrigation, BID, Florencia Reasons, MD .  Suvorexant TABS 20 mg, 20 mg, Oral, QHS PRN, Etta Quill, DO .  tiZANidine (ZANAFLEX)  tablet 4 mg, 4 mg, Oral, QHS, Jared M Gardner, DO, 4 mg at 03/15/16 2255 .  vancomycin (VANCOCIN) IVPB 1000 mg/200 mL premix, 1,000 mg, Intravenous, Q12H, Etta Quill, DO, 1,000 mg at 03/16/16 0019:  . azithromycin  500 mg Intravenous Q24H  . cefTRIAXone (ROCEPHIN)  IV  1 g Intravenous Q24H  . Chlorhexidine Gluconate Cloth  6 each Topical Q0600  . enoxaparin (LOVENOX) injection  40 mg Subcutaneous Q24H  . fentaNYL  100 mcg Transdermal Q72H  . fludrocortisone  100 mcg Oral Daily  . FLUoxetine  40 mg Oral Daily  . levothyroxine  200 mcg Oral QAC breakfast  . lipase/protease/amylase  24,000 Units Oral TID AC  . mouth rinse  15 mL Mouth Rinse BID  . multivitamin with minerals  1 tablet Oral Daily  . mupirocin ointment  1 application Nasal BID  . pantoprazole  40 mg Oral BID  . polyethylene glycol  17 g Oral Daily  . pregabalin  200 mg Oral BID  . senna-docusate  1 tablet Oral BID  . sodium hypochlorite   Irrigation BID  . tiZANidine  4 mg Oral QHS  . vancomycin  1,000 mg Intravenous Q12H  :  Allergies  Allergen Reactions  . Influenza Vac Split [Flu Virus Vaccine] Other (See Comments)    Joint stiffness and renal failure  . Pneumococcal Vaccines Other (See Comments)    "sepsis and renal failure"  . Ritalin [Methylphenidate Hcl]     "Heart attack"  . Zostavax [Zoster Vaccine Live ([employer])] Other (See Comments)    Joint stiffness, renal failure  . Adhesive [Tape] Itching and Rash  :  Family History  Problem Relation Age of Onset  . Cancer Mother     breast  . Hypertension Mother   . Anesthesia problems Daughter   :  Social History   Social History  . Marital status: Divorced    Spouse name: N/A  . Number of children: 1  . Years of education: N/A   Occupational History  . Not on file.  Social History Main Topics  . Smoking status: Never Smoker  . Smokeless tobacco: Never Used     Comment: NEVER USED TOBACCO  . Alcohol use No  . Drug use: No  . Sexual  activity: Not Currently   Other Topics Concern  . Not on file   Social History Narrative   Caffeine use: 3 cups coffee/tea daily   Regular exercise: no        :  Pertinent items are noted in HPI.  Exam: Patient Vitals for the past 24 hrs:  BP Temp Temp src Pulse Resp SpO2 Weight  03/16/16 0600 116/67 - - - 13 - -  03/16/16 0500 - - - - - - 290 lb 2 oz (131.6 kg)  03/16/16 0400 93/66 - - - (!) 9 100 % -  03/16/16 0331 - 98.2 F (36.8 C) Oral - - - -  03/16/16 0200 (!) 87/39 - - - 12 100 % -  03/16/16 0026 (!) 92/44 - - - - - -  03/16/16 0000 (!) 94/43 - - - - - -  03/15/16 2328 - 97.4 F (36.3 C) Oral - - - -  03/15/16 2125 - - - - 11 100 % -  03/15/16 2100 - - - - 12 98 % -  03/15/16 2001 - 98.4 F (36.9 C) Oral - - - -  03/15/16 2000 (!) 142/101 - - - 16 98 % -  03/15/16 1600 109/72 97.9 F (36.6 C) Oral - 16 - -  03/15/16 1200 137/75 97.8 F (36.6 C) Oral (!) 102 11 99 % -  03/15/16 0900 (!) 124/53 - - - 16 100 % -  03/15/16 0800 108/61 97.9 F (36.6 C) Oral 73 17 100 % -  03/15/16 0700 (!) 94/52 - - - 11 100 % -    Recent Labs  03/14/16 1758 03/16/16 0449  WBC 13.4* 11.2*  HGB 12.5 9.2*  HCT 38.1 28.3*  PLT 149* 121*    Recent Labs  03/15/16 0422 03/16/16 0449  NA 145 144  K 3.2* 3.2*  CL 114* 117*  CO2 24 22  GLUCOSE 185* 97  BUN 34* 26*  CREATININE 0.80 0.64  CALCIUM 7.8* 7.6*    Blood smear review:  None   Pathology: None     Assessment and Plan:  [name] is a 60 year old white female. She has metastatic breast cancer. She has multiple other health problems. Her BX problems are the slow healing and nonhealing decubitus ulcers. Again, I just do not able to treat her with these ulcers that are open. Any kind of chemotherapy will clearly make these ulcers worse. If the ulcers get worse, that she could easily get septic.  I have a feeling that she just is not going to be able to take any more treatment for the breast cancer. This is a  hard fact that she does not really understand. I will have to talk to her daughter.  From my point of view, I would think that her quality-of-life needs to be the priority. She has a very poor performance status. She really is not getting out of bed.  I will check her prealbumin. I will check her CA 27.29.  Adult think that we have to do any scans on her ear and we will see what the CA 27.29 shows. I think this will clearly give [place] a sense as to how her malignancy is progressing.  I really think that she would be a candidate  for hospice but she is just not willing to except that. As such, I just do not think we have to bring this up to her.  I appreciate everybody's care with her. I know this is somewhat complicated. Maybe surgery can help with some of the nonhealing decubiti.  We will check her labs. We will see about her anemia. If we get her blood count better, the maybe this might help a little bit with the healing.  As always, she is very very nice. I know she is trying her best but it is don't see that she has a lot of "reserve" to be able to handle stress, particularly any kind of chemotherapy.  Of note, I think if any chemotherapy were to be use, the chance of response would easily be less than 20%.   Lattie Haw, MD  2 Corinthians 9:8  Addendum:  I spoke to her daughter on the phone this morning. Her daughter has a good understanding of the situation. I told her daughter that I think there probably would be attempts at chance that we could treat her breast cancer. However, she really must be in better shape. She must be able to walk. These decubitus sores must be healed. I just am not confident that this would ever happen.  I told her daughter that we really have to think about her mother's quality-of-life. Currently, she really does not have a quality-of-life given all of her problems. She is in bed. She's not able to walk because she is weak. She has these nonhealing decubitus  ulcers.  I told her that the chance of chemotherapy actually working probably would be less than 20%.  I think her daughter understands quite well the problems that are in front of her mom. We will certainly continue to try to work hard to help her out.

## 2016-03-16 NOTE — Consult Note (Signed)
[facility] Surgery Consult/Admission Note  Kerri Vasquez [facility] 12/11/1955  [id number].    Requesting MD: Erlinda Hong, MD Chief Complaint/Reason for Consult: sacral decubitus ulcer   HPI:  60 year-old female with PMH stage IV breast cancer s/p bilateral mastectomy currently not on any medical therapy (Dr. Marin Olp), CHF secondary to takotsubo CM (EF 55-60%), chronic low back pain, and a sacral decubitus ulcer. Patient was admitted on 11/14 with acute chronic respiratory failure with hypoxia secondary to bilateral CAP and sepsis secondary to sacral ulcer. On admission she was hypotensive but hypotension has improved and critical care has signed off. Her lactate is 2.1 and she remains intermittently tachycardic. She is on day 3 of IV abx. General surgery has been asked to see the patient regarding her sacral decubitus ulcer.  Patient reports the ulcer is painful and foul smelling. Reports being bedbound secondary to arthritis.   She denies HA, chest pain, SOB, abdominal pain, and urinary symptoms at this time.  ROS: All systems reviewed and otherwise negative except for as above  Family History  Problem Relation Age of Onset  . Cancer Mother     breast  . Hypertension Mother   . Anesthesia problems Daughter     Past Medical History:  Diagnosis Date  . Anemia   . Breast cancer, right breast ([place]) dx'd 2008   right  . CHF (congestive heart failure) ([place])   . Chronic lower back pain   . Depression   . Diabetes mellitus    type II, controlled by diet 05/25/11   . Fibromyalgia   . Fracture of multiple toes    right toes x 4 toes- excluding small toe  . GERD (gastroesophageal reflux disease)   . Hypertension   . Hypothyroidism   . Incisional hernia   . Intestinal obstruction   . Iron deficiency anemia due to chronic blood loss 07/29/2015  . 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 Migraine    "weekly; but they don't last long" (08/26/2014)  .  Myocardial infarction 06/2014   S/P percutaneous intervention procedure a metastatic lesion in the liver  . Nausea and vomiting 04/2015  . Osteoarthritis of multiple joints   . Pernicious anemia 05/30/2011   Iron transfusion and VB12 on 06/06/11  . Renal insufficiency    hx renal failure 2011  . Sciatica   . Scoliosis   . Septic arthritis ([place]) 01/28/10   and spinal stenosis , moderate scoliosis   . Sleep apnea    hx of off machine x 4 years   . Spinal stenosis   . Thyroid cancer ([place]) 2013    Past Surgical History:  Procedure Laterality Date  . BREAST BIOPSY Right 2008  . BREAST IMPLANT REMOVAL Left 2015  . BREAST RECONSTRUCTION Bilateral 2008; 2009   "w/the mastectomy; "  . BREAST RECONSTRUCTION Left ~ 2010   "it capsized"  . CARDIAC CATHETERIZATION    . CHOLECYSTECTOMY OPEN  06/29/85  . COLECTOMY  2011   "SBO"  . GASTRIC BYPASS  2006   "w/scope"  . LEFT HEART CATHETERIZATION WITH CORONARY ANGIOGRAM N/A 07/14/2014   Procedure: LEFT HEART CATHETERIZATION WITH CORONARY ANGIOGRAM;  Surgeon: Leonie Man, MD;  Location: [facility] CATH LAB;  Service: Cardiovascular;  Laterality: N/A;  . LIVER BIOPSY  04/2015  . MASTECTOMY Bilateral 2008  . MULTIPLE EXTRACTIONS WITH ALVEOLOPLASTY  06/08/2011   Procedure: MULTIPLE EXTRACION WITH ALVEOLOPLASTY;  Surgeon: Lenn Cal, DDS;  Location: [facility] ORS;  Service: Oral Surgery;  Laterality: N/A;  Extraction of tooth #'s 3,4,7 with alveoloplasty and Gross Debridement of Remaining Teeth  . [place] LIVER TUMOR  2011; 01/26/2012  . THYROID LOBECTOMY  02/16/2012   Procedure: THYROID LOBECTOMY;  Surgeon: Earnstine Regal, MD;  Location: [facility] ORS;  Service: General;  Laterality: Right;  . TONSILLECTOMY  1969    Social History:  reports that she has never smoked. She has never used smokeless tobacco. She reports that she does not drink alcohol or use drugs.  Allergies:  Allergies  Allergen Reactions  . Influenza Vac Split [Flu Virus Vaccine]  Other (See Comments)    Joint stiffness and renal failure  . Pneumococcal Vaccines Other (See Comments)    "sepsis and renal failure"  . Ritalin [Methylphenidate Hcl]     "Heart attack"  . Zostavax [Zoster Vaccine Live ([employer])] Other (See Comments)    Joint stiffness, renal failure  . Adhesive [Tape] Itching and Rash    Medications Prior to Admission  Medication Sig Dispense Refill  . cyanocobalamin (,VITAMIN B-12,) 1000 MCG/ML injection INJECT 1 ML AS DIRECTED EVERY 21 DAYS 4 mL 3  . diphenoxylate-atropine (LOMOTIL) 2.5-0.025 MG tablet TAKE 1 TABLET BY MOUTH FOUR TIMES DAILY AS NEEDED FOR DIARRHEA 30 tablet 0  . fentaNYL (DURAGESIC - DOSED MCG/HR) 100 MCG/HR Place 1 patch (100 mcg total) onto the skin every 3 (three) days. 10 patch 0  . fentaNYL (DURAGESIC - DOSED MCG/HR) 25 MCG/HR patch Place 1 patch (25 mcg total) onto the skin every 3 (three) days. 10 patch 0  . fludrocortisone (FLORINEF) 0.1 MG tablet TAKE 1 TABLET BY MOUTH DAILY 90 tablet 0  . FLUoxetine (PROZAC) 40 MG capsule Take 40 mg by mouth daily before breakfast.     . ibuprofen (ADVIL,MOTRIN) 600 MG tablet Take 600 mg by mouth every 8 (eight) hours.   6  . levothyroxine (SYNTHROID, LEVOTHROID) 200 MCG tablet Take 200 mcg by mouth daily with breakfast.  0  . lidocaine-prilocaine (EMLA) cream Apply 1 application topically as needed for port access  3  . lipase/protease/amylase 24000-76000 units CPEP Take 1 capsule (24,000 Units total) by mouth 3 (three) times daily before meals. 270 capsule 0  . LORazepam (ATIVAN) 0.5 MG tablet PLACE 1 TABLET UNDER TONGUE EVERY 6 HOURS AS NEEDED FOR NAUSEA AND VOMITING 90 tablet 0  . methocarbamol (ROBAXIN) 750 MG tablet TAKE 1 TABLET BY MOUTH THREE TIMES DAILY AS NEEDED (Patient taking differently: TAKE 1 TABLET BY MOUTH every 8 hours for muscle spasms) 90 tablet 0  . Multiple Vitamin (MULTI-VITAMINS) TABS Take 1 tablet by mouth daily.    . nitroGLYCERIN (NITROSTAT) 0.4 MG SL tablet Place 1  tablet (0.4 mg total) under the tongue every 5 (five) minutes as needed for chest pain. 30 tablet 12  . ondansetron (ZOFRAN) 8 MG tablet TAKE 1 TABLET BY MOUTH EVERY 8 HOURS AS NEEDED FOR NAUSEA AND VOMITING. 60 tablet 0  . Oxycodone HCl 20 MG TABS Take 1 tablet (20 mg total) by mouth every 4 (four) hours as needed. (Patient taking differently: Take 20 mg by mouth every 4 (four) hours as needed (for pain). ) 180 tablet 0  . pantoprazole (PROTONIX) 40 MG tablet Take 1 tablet (40 mg total) by mouth 2 (two) times daily. 60 tablet 3  . polyethylene glycol (MIRALAX / GLYCOLAX) packet Take 17 g by mouth 2 (two) times daily. (Patient taking differently: Take 17 g by mouth daily as needed for mild constipation. )  14 each 6  . pregabalin (LYRICA) 200 MG capsule Take 1 capsule (200 mg total) by mouth 2 (two) times daily. 30 capsule 0  . prochlorperazine (COMPAZINE) 10 MG tablet TAKE 1 TABLET BY MOUTH EVERY 6 HOURS AS NEEDED FOR NAUSEA AND VOMITING 60 tablet 0  . promethazine (PHENERGAN) 25 MG tablet TAKE 1 TABLET BY MOUTH EVERY 8 HOURS AS NEEDED FOR NAUSEA OR VOMITING. 90 tablet 0  . Suvorexant (BELSOMRA) 20 MG TABS Take 20 mg by mouth at bedtime as needed. (Patient taking differently: Take 20 mg by mouth at bedtime. ) 30 tablet 0  . tiZANidine (ZANAFLEX) 4 MG capsule TAKE 1 CAPSULE BY MOUTH EVERY 8 HOURS (Patient taking differently: TAKE 1 CAPSULE BY MOUTH every night at bedtime) 90 capsule 0    Blood pressure 112/75, pulse (!) 102, temperature 98.1 F (36.7 C), temperature source Oral, resp. rate 10, height 5' 6.5" (1.689 m), weight 290 lb 2 oz (131.6 kg), last menstrual period 08/30/2006, SpO2 99 %. Physical Exam: General: cooperative, obese white female currently undergoing hydrotherapy for decubitus ulcer  HEENT: head is normocephalic, atraumatic. Mouth is pink and moist Heart: regular, rate, and rhythm.  No obvious murmurs, gallops, or rubs noted.  Palpable pedal pulses bilaterally Lungs: CTAB,  decreased breath sounds in bilateral bases.  Respiratory effort nonlabored Abd: soft, obese MS: all 4 extremities are symmetrical with bilateral edema of lower extremities  Skin: sacral decubitus ulcer as below:   Psych: A&Ox3 with an appropriate affect. Neuro: grossly intact, normal speech  Results for orders placed or performed during the hospital encounter of 03/14/16 (from the past 48 hour(s))  Comprehensive metabolic panel     Status: Abnormal   Collection Time: 03/14/16  5:58 PM  Result Value Ref Range   Sodium 147 (H) 135 - 145 mmol/L   Potassium 3.5 3.5 - 5.1 mmol/L   Chloride 115 (H) 101 - 111 mmol/L   CO2 23 22 - 32 mmol/L   Glucose, Bld 122 (H) 65 - 99 mg/dL   BUN 36 (H) 6 - 20 mg/dL   Creatinine, Ser 0.81 0.44 - 1.00 mg/dL   Calcium 9.0 8.9 - 10.3 mg/dL   Total Protein 6.7 6.5 - 8.1 g/dL   Albumin 3.1 (L) 3.5 - 5.0 g/dL   AST 23 15 - 41 U/L   ALT 22 14 - 54 U/L   Alkaline Phosphatase 80 38 - 126 U/L   Total Bilirubin 1.2 0.3 - 1.2 mg/dL   GFR calc non Af Amer >60 >60 mL/min   GFR calc Af Amer >60 >60 mL/min    Comment: (NOTE) The eGFR has been calculated using the CKD EPI equation. This calculation has not been validated in all clinical situations. eGFR's persistently <60 mL/min signify possible Chronic Kidney Disease.    Anion gap 9 5 - 15  CBC with Differential     Status: Abnormal   Collection Time: 03/14/16  5:58 PM  Result Value Ref Range   WBC 13.4 (H) 4.0 - 10.5 K/uL   RBC 3.81 (L) 3.87 - 5.11 MIL/uL   Hemoglobin 12.5 12.0 - 15.0 g/dL   HCT 38.1 36.0 - 46.0 %   MCV 100.0 78.0 - 100.0 fL   MCH 32.8 26.0 - 34.0 pg   MCHC 32.8 30.0 - 36.0 g/dL   RDW 16.0 (H) 11.5 - 15.5 %   Platelets 149 (L) 150 - 400 K/uL   Neutrophils Relative % 89 %   Neutro Abs 11.9 (H) 1.7 -  7.7 K/uL   Lymphocytes Relative 6 %   Lymphs Abs 0.7 0.7 - 4.0 K/uL   Monocytes Relative 4 %   Monocytes Absolute 0.5 0.1 - 1.0 K/uL   Eosinophils Relative 1 %   Eosinophils Absolute 0.2  0.0 - 0.7 K/uL   Basophils Relative 0 %   Basophils Absolute 0.0 0.0 - 0.1 K/uL  Brain natriuretic peptide     Status: Abnormal   Collection Time: 03/14/16  5:58 PM  Result Value Ref Range   B Natriuretic Peptide 162.1 (H) 0.0 - 100.0 pg/mL  D-dimer, quantitative (not at [facility])     Status: Abnormal   Collection Time: 03/14/16  5:58 PM  Result Value Ref Range   D-Dimer, Quant 3.13 (H) 0.00 - 0.50 ug/mL-FEU    Comment: (NOTE) At the manufacturer cut-off of 0.50 ug/mL FEU, this assay has been documented to exclude PE with a sensitivity and negative predictive value of 97 to 99%.  At this time, this assay has not been approved by the FDA to exclude DVT/VTE. Results should be correlated with clinical presentation.   I-stat troponin, ED     Status: None   Collection Time: 03/14/16  6:07 PM  Result Value Ref Range   Troponin i, poc 0.00 0.00 - 0.08 ng/mL   Comment 3            Comment: Due to the release kinetics of cTnI, a negative result within the first hours of the onset of symptoms does not rule out myocardial infarction with certainty. If myocardial infarction is still suspected, repeat the test at appropriate intervals.   MRSA PCR Screening     Status: Abnormal   Collection Time: 03/14/16  9:30 PM  Result Value Ref Range   MRSA by PCR POSITIVE (A) NEGATIVE    Comment:        The GeneXpert MRSA Assay (FDA approved for NASAL specimens only), is one component of a comprehensive MRSA colonization surveillance program. It is not intended to diagnose MRSA infection nor to guide or monitor treatment for MRSA infections. RESULT CALLED TO, READ BACK BY AND VERIFIED WITH: [facility] RN [phone] [id number] [place],N   Strep pneumoniae urinary antigen     Status: None   Collection Time: 03/14/16 11:03 PM  Result Value Ref Range   Strep Pneumo Urinary Antigen NEGATIVE NEGATIVE    Comment:        Infection due to S. pneumoniae cannot be absolutely ruled out since the antigen  present may be below the detection limit of the test.   Culture, blood (routine x 2) Call MD if unable to obtain prior to antibiotics being given     Status: None (Preliminary result)   Collection Time: 03/15/16 12:40 AM  Result Value Ref Range   Specimen Description BLOOD PORTA CATH    Special Requests BOTTLES DRAWN AEROBIC AND ANAEROBIC 10 CC    Culture      NO GROWTH 1 DAY Performed at [facility]    Report Status PENDING   Basic metabolic panel     Status: Abnormal   Collection Time: 03/15/16  4:22 AM  Result Value Ref Range   Sodium 145 135 - 145 mmol/L   Potassium 3.2 (L) 3.5 - 5.1 mmol/L   Chloride 114 (H) 101 - 111 mmol/L   CO2 24 22 - 32 mmol/L   Glucose, Bld 185 (H) 65 - 99 mg/dL   BUN 34 (H) 6 - 20 mg/dL   Creatinine, Ser 0.80  0.44 - 1.00 mg/dL   Calcium 7.8 (L) 8.9 - 10.3 mg/dL   GFR calc non Af Amer >60 >60 mL/min   GFR calc Af Amer >60 >60 mL/min    Comment: (NOTE) The eGFR has been calculated using the CKD EPI equation. This calculation has not been validated in all clinical situations. eGFR's persistently <60 mL/min signify possible Chronic Kidney Disease.    Anion gap 7 5 - 15  HIV antibody     Status: None   Collection Time: 03/15/16  4:22 AM  Result Value Ref Range   HIV Screen 4th Generation wRfx Non Reactive Non Reactive    Comment: (NOTE) Performed At: [facility] [address] [place], [place] [id number] Lindon Romp MD NW:[id number]   Lactic acid, plasma     Status: Abnormal   Collection Time: 03/15/16  4:22 AM  Result Value Ref Range   Lactic Acid, Venous 2.1 (HH) 0.5 - 1.9 mmol/L    Comment: CRITICAL RESULT CALLED TO, READ BACK BY AND VERIFIED WITH: DENNY,C RN 11.15.17 '@509'  ZANDO,C   Procalcitonin     Status: None   Collection Time: 03/15/16  4:22 AM  Result Value Ref Range   Procalcitonin 0.76 ng/mL    Comment:        Interpretation: PCT > 0.5 ng/mL and <= 2 ng/mL: Systemic infection (sepsis) is possible, but  other conditions are known to elevate PCT as well. (NOTE)         ICU PCT Algorithm               Non ICU PCT Algorithm    ----------------------------     ------------------------------         PCT < 0.25 ng/mL                 PCT < 0.1 ng/mL     Stopping of antibiotics            Stopping of antibiotics       strongly encouraged.               strongly encouraged.    ----------------------------     ------------------------------       PCT level decrease by               PCT < 0.25 ng/mL       >= 80% from peak PCT       OR PCT 0.25 - 0.5 ng/mL          Stopping of antibiotics                                             encouraged.     Stopping of antibiotics           encouraged.    ----------------------------     ------------------------------       PCT level decrease by              PCT >= 0.25 ng/mL       < 80% from peak PCT        AND PCT >= 0.5 ng/mL             Continuing antibiotics  encouraged.       Continuing antibiotics            encouraged.    ----------------------------     ------------------------------     PCT level increase compared          PCT > 0.5 ng/mL         with peak PCT AND          PCT >= 0.5 ng/mL             Escalation of antibiotics                                          strongly encouraged.      Escalation of antibiotics        strongly encouraged.   Troponin I (q 6hr x 3)     Status: None   Collection Time: 03/15/16  4:22 AM  Result Value Ref Range   Troponin I <0.03 <0.03 ng/mL  Lactic acid, plasma     Status: None   Collection Time: 03/15/16  9:42 AM  Result Value Ref Range   Lactic Acid, Venous 1.0 0.5 - 1.9 mmol/L  Troponin I (q 6hr x 3)     Status: None   Collection Time: 03/15/16  9:42 AM  Result Value Ref Range   Troponin I <0.03 <0.03 ng/mL  Troponin I (q 6hr x 3)     Status: None   Collection Time: 03/15/16  3:30 PM  Result Value Ref Range   Troponin I <0.03 <0.03 ng/mL  Basic  metabolic panel     Status: Abnormal   Collection Time: 03/16/16  4:49 AM  Result Value Ref Range   Sodium 144 135 - 145 mmol/L   Potassium 3.2 (L) 3.5 - 5.1 mmol/L   Chloride 117 (H) 101 - 111 mmol/L   CO2 22 22 - 32 mmol/L   Glucose, Bld 97 65 - 99 mg/dL   BUN 26 (H) 6 - 20 mg/dL   Creatinine, Ser 0.64 0.44 - 1.00 mg/dL   Calcium 7.6 (L) 8.9 - 10.3 mg/dL   GFR calc non Af Amer >60 >60 mL/min   GFR calc Af Amer >60 >60 mL/min    Comment: (NOTE) The eGFR has been calculated using the CKD EPI equation. This calculation has not been validated in all clinical situations. eGFR's persistently <60 mL/min signify possible Chronic Kidney Disease.    Anion gap 5 5 - 15  CBC with Differential/Platelet     Status: Abnormal   Collection Time: 03/16/16  4:49 AM  Result Value Ref Range   WBC 11.2 (H) 4.0 - 10.5 K/uL   RBC 2.78 (L) 3.87 - 5.11 MIL/uL   Hemoglobin 9.2 (L) 12.0 - 15.0 g/dL    Comment: DELTA CHECK NOTED REPEATED TO VERIFY    HCT 28.3 (L) 36.0 - 46.0 %   MCV 101.8 (H) 78.0 - 100.0 fL   MCH 33.1 26.0 - 34.0 pg   MCHC 32.5 30.0 - 36.0 g/dL   RDW 16.3 (H) 11.5 - 15.5 %   Platelets 121 (L) 150 - 400 K/uL   Neutrophils Relative % 84 %   Neutro Abs 9.5 (H) 1.7 - 7.7 K/uL   Lymphocytes Relative 8 %   Lymphs Abs 0.9 0.7 - 4.0 K/uL   Monocytes Relative 5 %   Monocytes Absolute 0.6 0.1 - 1.0 K/uL  Eosinophils Relative 3 %   Eosinophils Absolute 0.3 0.0 - 0.7 K/uL   Basophils Relative 0 %   Basophils Absolute 0.0 0.0 - 0.1 K/uL  Magnesium     Status: None   Collection Time: 03/16/16  4:49 AM  Result Value Ref Range   Magnesium 1.7 1.7 - 2.4 mg/dL  TSH     Status: None   Collection Time: 03/16/16  4:49 AM  Result Value Ref Range   TSH 2.219 0.350 - 4.500 uIU/mL    Comment: Performed by a 3rd Generation assay with a functional sensitivity of <=0.01 uIU/mL.  Hepatic function panel     Status: Abnormal   Collection Time: 03/16/16  7:30 AM  Result Value Ref Range   Total  Protein 5.6 (L) 6.5 - 8.1 g/dL   Albumin 2.5 (L) 3.5 - 5.0 g/dL   AST 22 15 - 41 U/L   ALT 19 14 - 54 U/L   Alkaline Phosphatase 65 38 - 126 U/L   Total Bilirubin 0.7 0.3 - 1.2 mg/dL   Bilirubin, Direct 0.1 0.1 - 0.5 mg/dL   Indirect Bilirubin 0.6 0.3 - 0.9 mg/dL   Ct Angio Chest Pe W/cm &/or Wo Cm  Addendum Date: 03/14/2016   ADDENDUM REPORT: 03/14/2016 21:20 ADDENDUM: There is a mildly displaced eighth rib fracture on the right laterally. Electronically Signed   By: Lowella Grip III M.D.   On: 03/14/2016 21:20   Result Date: 03/14/2016 CLINICAL DATA:  Shortness of breath. Breast carcinoma. Tachycardia. EXAM: CT ANGIOGRAPHY CHEST WITH CONTRAST TECHNIQUE: Multidetector CT imaging of the chest was performed using the standard protocol during bolus administration of intravenous contrast. Multiplanar CT image reconstructions and MIPs were obtained to evaluate the vascular anatomy. CONTRAST:  100 mL Isovue 370 nonionic COMPARISON:  Chest CT February 02, 2016; chest radiograph March 14, 2016 FINDINGS: Cardiovascular: There is no demonstrable pulmonary embolus. Ascending thoracic aorta measures 4.0 x 4.0 in transverse diameter; this measurement was obtained on axial slice 38 series 4. There is no demonstrable thoracic aortic dissection. The visualized great vessels appear normal. Note that the right and left common carotid arteries arise as a common trunk, an anatomic variant. There is no appreciable pericardial effusion. Port-A-Cath tip is at the cavoatrial junction. Mediastinum/Nodes: Surgical clips are noted in the right thyroid region consistent with hemi thyroidectomy. No thyroid lesion present currently. Left lobe thyroid diminutive. There is no appreciable thoracic adenopathy. Lungs/Pleura: There is a focal opacity in the anterior right apex measuring 1.2 x 1.1 cm. Patchy opacity is noted in the right upper lobe with several ill-defined opacities in the right upper lobe, possibly representing  small nodular lesions. There is airspace consolidation in a portion of the anterior segment of the left upper lobe. There is ill-defined patchy airspace opacity in the right lower lobe at multiple sites. There is atelectatic change in the left base. There is a small left pleural effusion. Upper Abdomen: In the visualized upper abdomen, liver metastases are again noted without change. The largest of these metastases arises in the anterior segment right lobe measuring 5.2 x 3.0 cm. Visualized upper abdominal structures otherwise appear unremarkable. Musculoskeletal: There is a breast implant on the right. There is degenerative change in the thoracic spine. No blastic or lytic bone lesions are evident. There is evidence of a prior fracture of the posterior right tenth rib. There is a mildly displaced fracture of the lateral right eighth rib. There are no blastic or lytic bone lesions. Review of  the MIP images confirms the above findings. IMPRESSION: No demonstrable pulmonary embolus. Ascending thoracic aorta measures 4.0 x 4.0 cm in diameter. Recommend annual imaging followup by CTA or MRA. This recommendation follows 2010 ACCF/AHA/AATS/ACR/ASA/SCA/SCAI/SIR/STS/SVM Guidelines for the Diagnosis and Management of Patients with Thoracic Aortic Disease. Circulation. 2010; 121: Y774-J287. No dissection evident. Patchy areas of apparent pneumonia bilaterally. Nodular appearing opacity in the right apex measuring 1.2 x 1.1 cm raises concern for metastatic focus. There may be small metastases in the right upper lobe intermingled with patchy pneumonia. There is a rather minimal pericardial effusion. No appreciable thoracic adenopathy. Multiple liver metastases. Breast implant on the right. Electronically Signed: By: Lowella Grip III M.D. On: 03/14/2016 21:11   Dg Chest Port 1 View  Result Date: 03/14/2016 CLINICAL DATA:  Shortness of breath today, history breast cancer, CHF, diabetes mellitus, hypertension EXAM:  PORTABLE CHEST 1 VIEW COMPARISON:  Portable exam 1638 hours compared 02/02/2016 FINDINGS: RIGHT jugular Port-A-Cath with tip projecting over cavoatrial junction. Enlargement of cardiac silhouette. Mediastinal contours and pulmonary vascularity grossly normal for technique. Peribronchial thickening without infiltrate, pleural effusion or pneumothorax. Diffuse osseous demineralization. IMPRESSION: Enlargement of cardiac silhouette. Mild bronchitic changes without infiltrate. Electronically Signed   By: Lavonia Dana M.D.   On: 03/14/2016 17:05   Assessment/Plan Sepsis secondary to Sacral decubitus ulcer/bilateral CAP  Recommend surgical debridement in the OR tomorrow by CCS,MD on call  NPO after midnight  Continue IV abx, IVF, analgesics PRN   Consent for surgery ordered  Jill Alexanders, [facility] Surgery 03/16/2016, 4:17 PM Pager: [phone] Consults: [phone] Mon-Fri 7:00 am-4:30 pm Sat-Sun 7:00 am-11:30 am

## 2016-03-16 NOTE — Evaluation (Signed)
Physical Therapy Evaluation Patient Details Name: Kerri Vasquez MRN: [id number] DOB: 02-07-1956 Today's Date: 03/16/2016   History of Present Illness  60 yo female admitted with acute respiratory failure, CHF exacerbation, and an unstageable sacral wound. Hx of CHF, stage IV breast cancer, chronic LBP, arthritis  Clinical Impression   On eval, pt required Total assist +2 for bed mobility. Multimodal cueing and increased time required. Rolling to L and R sides with use of bedpad and bedrail to assist. Recommend SNF for rehab and wound care. PT is following for wound care during hospital stay.    Follow Up Recommendations SNF    Equipment Recommendations  None recommended by PT    Recommendations for Other Services OT consult     Precautions / Restrictions Precautions Precautions: Fall Restrictions Weight Bearing Restrictions: No      Mobility  Bed Mobility Overal bed mobility: Needs Assistance Bed Mobility: Rolling Rolling: Total assist;+2 for physical assistance;+2 for safety/equipment         General bed mobility comments: Rolling x2 for positioning, linen removal/replacement. Multimodal cueing required. Increased time.   Transfers                    Ambulation/Gait                Stairs            Wheelchair Mobility    Modified Rankin (Stroke Patients Only)       Balance                                             Pertinent Vitals/Pain Pain Assessment: Faces Faces Pain Scale: Hurts worst Pain Location: sacral wound area  Pain Descriptors / Indicators: Grimacing;Sore;Crying Pain Intervention(s): Limited activity within patient's tolerance;Repositioned    Home Living Family/patient expects to be discharged to:: Skilled nursing facility Living Arrangements: Children Available Help at Discharge: Family Type of Home: House         Home Equipment: Bedside commode;Walker - 2 wheels;Cane - single  point;Wheelchair - manual      Prior Function Level of Independence: Needs assistance   Gait / Transfers Assistance Needed: bedbound per chart. Pt reports she was just "stuck in bed"           Hand Dominance        Extremity/Trunk Assessment   Upper Extremity Assessment: Generalized weakness           Lower Extremity Assessment:  (Strength ~2/5)         Communication   Communication: No difficulties  Cognition Arousal/Alertness: Awake/alert Behavior During Therapy: WFL for tasks assessed/performed Overall Cognitive Status: Within Functional Limits for tasks assessed                      General Comments      Exercises     Assessment/Plan    PT Assessment Patient needs continued PT services  PT Problem List Decreased strength;Decreased mobility;Decreased balance;Pain;Obesity;Decreased range of motion;Decreased activity tolerance;Decreased knowledge of use of DME          PT Treatment Interventions Therapeutic activities;Therapeutic exercise;Patient/family education;Functional mobility training;Balance training;DME instruction    PT Goals (Current goals can be found in the Care Plan section)  Acute Rehab PT Goals Patient Stated Goal: less pain. PT Goal Formulation: With patient Time For Goal Achievement: 03/30/16 Potential  to Achieve Goals: Fair    Frequency Min 2X/week   Barriers to discharge        Co-evaluation               End of Session   Activity Tolerance: Patient limited by pain;Patient limited by fatigue Patient left: in bed;with call bell/phone within reach;with bed alarm set           Time: 1545-1555 PT Time Calculation (min) (ACUTE ONLY): 10 min   Charges:   PT Evaluation $PT Eval Low Complexity: 1 Procedure     PT G Codes:        Weston Anna, MPT Pager: [phone]

## 2016-03-16 NOTE — Progress Notes (Signed)
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 (HCC)   Chronic diastolic CHF (congestive heart failure) (HCC)   Tachycardia   Decubitus ulcer of sacral region, stage 3 ([place])   CAP (community acquired pneumonia)   Sepsis ([place])   Acute hypoxic respiratory failure: CTA no PE, troponin negative,  actue hypoxic respiratory failure Likely from sepsis/bilateral pna and diastolic chf exacerbation, sedation from opiods analgesia could also contribute, patient reports she has been feeling sob for the last two days prior to coming to the hospital  per EMS, 02 dropped to 70's (pateint does not require o2 supplement at baseline ), she was put on cpap by EMS,  Improving, currently on 3liter, Wean as tolerated  Hypotension:  Patient developed significant hypotension the first night of admission, sbp in the 60's and 70's, critical care was consulted at night due to concerning needing central lines and alines. She has improved the next morning. Hypotension Likely from sepsis, but patient does has low bp during last hospitalization, she was put on florinef Treat sepsis, continue florinef, avoid sedation   Bilateral pna / sepsis with hypotension, leukocytosis, lactic acidosis, hypoxia on presentation: mrsa screening positive, blood culture no growth, sputum culture collected, result pending, wound swab result pending Patient also has several unstageble decubitus ulcers, the one on sacrum with malodorous drainage, general surgery consulted wean oxygen, continue abx  With vanc/rocephin/zithro,avoid  sedation   Acute on chronic diastolic chf exacerbation:  She was discharged on lasix 40mg  po qd a month ago, she is taken off coreg from last hospitalization due to hypotension. Lasix held since admission, she received ivf due to sepsis She has chronic generalized edema, likely combination of diastolic chf and low albumin state Lower extremity worse on 11/16, bp better, restart lasix, monitor volume status, bp, lytes   Hypothyroidism: patient in the past noncompliant with synthroid supplement,  continue synthroid,  tsh 2.2 ( this has much improved compare to a month ago ) She will need to have repeat tsh in 4weeks to ensure stability, it seems her tsh has fluctuated in the past  Chronic pain: she was discharged on fentanyl 77mcg q72hr a month ago, she report take 12mcg instead. she is also on oxycodone 20mg  q4hrs prn ( she was discharged on 15mg  q4hr prn a month ago),  on lyrica, zanaflex at bedtime, ibuprofen   Constipation: with chronic pain meds, bedridden status and hypothyroidism, patient is prone to constipation, start sennakot and miralax   Metastatic breast cancer, with liver mets and possible mets to omentum, she has progressive weakness, has been bedridden for the last two months, she is off chemotherapy, oncology Dr Martha Clan  Consulted  Sacral decubitus ulcer presented on admission  Wound care consulted "Wound type:stage IV sacral wound, unstageable R lateral ankle and R lateral leg just proximal to ankle, stage I R lateral calf Pressure Ulcer POA: Yes Measurement:sacrum 6cm x 7.5cm x 6cm 60% black, 40% red granulating tissue, small amt drainage, malodorous (consistant with necrotic tissue) R lateral ankle 0.5cm x 0.5cm x 0.2cm unstageable 100% slough no odor, small amt drainage R lateral leg 0.5cm x 0.5cm x 0.1cm 100% slough,  no drainage, or odor Right lateral lower leg stage I 7cm x 2cm  Left lateral ankle 1cm x 2cm unstageable, 100% slough no drainage or odor Wound  bed:see above Drainage (amount, consistency, odor) see above Periwound:see above  Dressing procedure/placement/frequency:Pt is on a therapeutic mattress with low air loss feature and will require this support surface when transfers to floor.  Staff provided with guidance to minimize layers of bed linens. Recommend surgical consult for unstageable sacral ulcer. If you agree please order. In the meantime we will begin hydrotherapy daily followed by 3 days of twice daily dressings with sodium hypochlorite (Dakins, quarter percent). Pressure redistribution heel boots have been in use at home and will be continued here. Nutritional consult ordered for increased requirements for tissue repair and regeneration."  General surgery consulted  morbid obesity:  Body mass index is 46.13 kg/m.   FTT, bedridden , from home  Code Status: Full  Family Communication: patient   Disposition Plan:  She is admitted to stepdown unit Transfer to med tele on 11/16 pm daughter agreed to SNF placement at discharge   Consultants:  Critical care  Oncology  General surgery  Procedures:  none  Antibiotics:  Vanc/rocephin/zithro   Objective: BP 116/67   Pulse (!) 102   Temp 98.2 F (36.8 C) (Oral)   Resp 13   Ht 5' 6.5" (1.689 m)   Wt 131.6 kg (290 lb 2 oz)   LMP 08/30/2006   SpO2 100%   BMI 46.13 kg/m   Intake/Output Summary (Last 24 hours) at 03/16/16 0811 Last data filed at 03/16/16 0600  Gross per 24 hour  Intake             3975 ml  Output              995 ml  Net             2980 ml   Filed Weights   03/14/16 1547 03/14/16 2200 03/16/16 0500  Weight: 126.1 kg (278 lb) 120.4 kg (265 lb 6.9 oz) 131.6 kg (290 lb 2 oz)    Exam:   General:  Obese, chronically ill, aaox3  Cardiovascular: RRR  Respiratory: diminished at basis  Abdomen: Soft/ND/NT, positive BS  Musculoskeletal: chronic right upper extremity Edema ( lymphodema from breast cancer surgery), bilateral lower  extremity pitting edema, able to move toes and attempt to lift both legs off the bed  Neuro: aaox3  Data Reviewed: Basic Metabolic Panel:  Recent Labs Lab 03/14/16 1758 03/15/16 0422 03/16/16 0449  NA 147* 145 144  K 3.5 3.2* 3.2*  CL 115* 114* 117*  CO2 23 24 22   GLUCOSE 122* 185* 97  BUN 36* 34* 26*  CREATININE 0.81 0.80 0.64  CALCIUM 9.0 7.8* 7.6*  MG  --   --  1.7   Liver Function Tests:  Recent Labs Lab 03/14/16 1758  AST 23  ALT 22  ALKPHOS 80  BILITOT 1.2  PROT 6.7  ALBUMIN 3.1*   No results for input(s): LIPASE, AMYLASE in the last 168 hours. No results for input(s): AMMONIA in the last 168 hours. CBC:  Recent Labs Lab 03/14/16 1758 03/16/16 0449  WBC 13.4* 11.2*  NEUTROABS 11.9* 9.5*  HGB 12.5 9.2*  HCT 38.1 28.3*  MCV 100.0 101.8*  PLT 149* 121*   Cardiac Enzymes:    Recent Labs Lab 03/15/16 0422 03/15/16 0942 03/15/16 1530  TROPONINI <0.03 <0.03 <0.03   BNP (last 3 results)  Recent Labs  03/14/16 1758  BNP 162.1*    ProBNP (last 3 results) No results for input(s): PROBNP in the last 8760 hours.  CBG: No results for input(s): GLUCAP in the last 168 hours.  Recent Results (from the past 240 hour(s))  MRSA PCR Screening     Status: Abnormal   Collection Time: 03/14/16  9:30 PM  Result Value Ref Range Status   MRSA by PCR POSITIVE (A) NEGATIVE Final    Comment:        The GeneXpert MRSA Assay (FDA approved for NASAL specimens only), is one component of a comprehensive MRSA colonization surveillance program. It is not intended to diagnose MRSA infection nor to guide or monitor treatment for MRSA infections. RESULT CALLED TO, READ BACK BY AND VERIFIED WITH: [facility] RN [phone] [id number] C[place],N      Studies: No results found.  Scheduled Meds: . azithromycin  500 mg Intravenous Q24H  . cefTRIAXone (ROCEPHIN)  IV  1 g Intravenous Q24H  . Chlorhexidine Gluconate Cloth  6 each Topical Q0600  . enoxaparin (LOVENOX)  injection  40 mg Subcutaneous Q24H  . fentaNYL  100 mcg Transdermal Q72H  . fludrocortisone  100 mcg Oral Daily  . FLUoxetine  40 mg Oral Daily  . levothyroxine  200 mcg Oral QAC breakfast  . lipase/protease/amylase  24,000 Units Oral TID AC  . mouth rinse  15 mL Mouth Rinse BID  . multivitamin with minerals  1 tablet Oral Daily  . mupirocin ointment  1 application Nasal BID  . pantoprazole  40 mg Oral BID  . polyethylene glycol  17 g Oral Daily  . potassium chloride  40 mEq Oral Once  . pregabalin  200 mg Oral BID  . senna-docusate  1 tablet Oral BID  . sodium hypochlorite   Irrigation BID  . tiZANidine  4 mg Oral QHS  . vancomycin  1,000 mg Intravenous Q12H    Continuous Infusions:    Time spent: 7mins  Lasheka Kempner MD, PhD  [facility] Pager [phone]. If 7PM-7AM, please contact night-coverage at [email or web address], password [facility] 03/16/2016, 8:11 AM  LOS: 2 days

## 2016-03-16 NOTE — Progress Notes (Signed)
HYDROTHERAPY EVALUATION     03/16/16 1600  Subjective Assessment  Subjective i was stuck in bed  Patient and Family Stated Goals none stated  Date of Onset (unknown)  Evaluation and Treatment  Evaluation and Treatment Procedures Explained to Patient/Family Yes  Evaluation and Treatment Procedures agreed to  Pressure Injury 03/14/16 Unstageable - Full thickness tissue loss in which the base of the ulcer is covered by slough (yellow, tan, gray, green or brown) and/or eschar (tan, brown or black) in the wound bed. eschar   Date First Assessed/Time First Assessed: 03/14/16 2145   Location: Sacrum  Location Orientation: Mid  Staging: Unstageable - Full thickness tissue loss in which the base of the ulcer is covered by slough (yellow, tan, gray, green or brown) and/or esch...  Dressing Type (Dakin's soaked Kerlix; 4x4; ABD )  Dressing Changed  Dressing Change Frequency Daily  State of Healing Eschar  Site / Wound Assessment Black;Pink;Bleeding;Painful  % Wound base Red or Granulating 30%  % Wound base Black/Eschar 70%  Peri-wound Assessment Bleeding;Excoriated;Pink  Wound Length (cm) 7 cm  Wound Width (cm) 6.5 cm  Wound Depth (cm) 6 cm  Tunneling (cm) (7:00-6.5 cm; 9:00-6 cm)  Margins Unattached edges (unapproximated)  Drainage Amount Copious  Drainage Description Odor;Purulent  Treatment Hydrotherapy (Pulse lavage);Debridement (Selective) (Packing with Dakins)  Hydrotherapy  Pulsed lavage therapy - wound location sacrum  Pulsed Lavage with Suction (psi) 8 psi  Pulsed Lavage with Suction - Normal Saline Used 1000 mL  Pulsed Lavage Tip Tip with splash shield  Selective Debridement  Selective Debridement - Location sacrum  Selective Debridement - Tools Used Forceps;Scissors  Selective Debridement - Tissue Removed necrotic   Wound Therapy - Assess/Plan/Recommendations  Wound Therapy - Clinical Statement 60 yo female with CHF, stage IV breast cancer, chronic LBP, arthritis, lymphedema  admitted with acute respiratory failure and an unstageable sacral wound  Wound Therapy - Functional Problem List Per chart, pt is bedbound  Factors Delaying/Impairing Wound Healing Immobility;Multiple medical problems  Hydrotherapy Plan Debridement;Dressing change;Patient/family education;Pulsatile lavage with suction  Wound Therapy - Frequency 6X / week  Wound Therapy - Current Recommendations Case manager/social work (surgery in to assess the wound during hydro evaluation)  Wound Therapy - Follow Up Recommendations Skilled nursing facility;Home health RN  Wound Plan Hydrotherapy and dressing changes to promote wound healing  Wound Therapy Goals - Improve the function of patient's integumentary system by progressing the wound(s) through the phases of wound healing by:  Decrease Necrotic Tissue to 40%  Increase Granulation Tissue to 60%  Improve Drainage Characteristics Min  Goals/treatment plan/discharge plan were made with and agreed upon by patient/family Yes  Time For Goal Achievement 2 weeks  Wound Therapy - Potential for Goals Good   Weston Anna, MPT [phone]

## 2016-03-17 ENCOUNTER — Encounter (HOSPITAL_COMMUNITY): Payer: Self-pay | Admitting: Surgery

## 2016-03-17 ENCOUNTER — Inpatient Hospital Stay (HOSPITAL_COMMUNITY): Payer: Medicare Other | Admitting: Certified Registered"

## 2016-03-17 ENCOUNTER — Encounter (HOSPITAL_COMMUNITY)
Admission: EM | Disposition: A | Discharge status: Skilled Nursing Facility | Payer: Self-pay | Source: Home / Self Care | Attending: Internal Medicine

## 2016-03-17 HISTORY — PX: IRRIGATION AND DEBRIDEMENT ABSCESS: SHX5252

## 2016-03-17 LAB — CANCER ANTIGEN 27.29: CA 27.29: 500.1 U/mL — AB (ref 0.0–38.6)

## 2016-03-17 LAB — GLUCOSE, CAPILLARY: Glucose-Capillary: 105 mg/dL — ABNORMAL HIGH (ref 65–99)

## 2016-03-17 LAB — BASIC METABOLIC PANEL
ANION GAP: 6 (ref 5–15)
BUN: 19 mg/dL (ref 6–20)
CALCIUM: 8.3 mg/dL — AB (ref 8.9–10.3)
CO2: 24 mmol/L (ref 22–32)
CREATININE: 0.7 mg/dL (ref 0.44–1.00)
Chloride: 112 mmol/L — ABNORMAL HIGH (ref 101–111)
Glucose, Bld: 92 mg/dL (ref 65–99)
Potassium: 3.9 mmol/L (ref 3.5–5.1)
SODIUM: 142 mmol/L (ref 135–145)

## 2016-03-17 LAB — CBC
HCT: 32.5 % — ABNORMAL LOW (ref 36.0–46.0)
HEMOGLOBIN: 10.3 g/dL — AB (ref 12.0–15.0)
MCH: 32.7 pg (ref 26.0–34.0)
MCHC: 31.7 g/dL (ref 30.0–36.0)
MCV: 103.2 fL — ABNORMAL HIGH (ref 78.0–100.0)
PLATELETS: 122 10*3/uL — AB (ref 150–400)
RBC: 3.15 MIL/uL — AB (ref 3.87–5.11)
RDW: 16.6 % — ABNORMAL HIGH (ref 11.5–15.5)
WBC: 10.3 10*3/uL (ref 4.0–10.5)

## 2016-03-17 LAB — MAGNESIUM: MAGNESIUM: 1.6 mg/dL — AB (ref 1.7–2.4)

## 2016-03-17 LAB — ERYTHROPOIETIN: Erythropoietin: 25.4 m[IU]/mL — ABNORMAL HIGH (ref 2.6–18.5)

## 2016-03-17 SURGERY — IRRIGATION AND DEBRIDEMENT ABSCESS
Anesthesia: General | Site: Buttocks

## 2016-03-17 MED ORDER — MIDAZOLAM HCL 2 MG/2ML IJ SOLN
INTRAMUSCULAR | Status: AC
  Filled 2016-03-17: qty 2

## 2016-03-17 MED ORDER — CHLORHEXIDINE GLUCONATE CLOTH 2 % EX PADS: 6.0000 | MEDICATED_PAD | Freq: Once | CUTANEOUS | Status: DC

## 2016-03-17 MED ORDER — LACTATED RINGERS IV SOLN
INTRAVENOUS | Status: DC
  Administered 2016-03-17: 12:00:00 via INTRAVENOUS

## 2016-03-17 MED ORDER — PROPOFOL 10 MG/ML IV BOLUS
INTRAVENOUS | Status: AC
  Filled 2016-03-17: qty 20

## 2016-03-17 MED ORDER — 0.9 % SODIUM CHLORIDE (POUR BTL) OPTIME
TOPICAL | Status: DC | PRN
  Administered 2016-03-17: 1000 mL

## 2016-03-17 MED ORDER — ONDANSETRON HCL 4 MG/2ML IJ SOLN
INTRAMUSCULAR | Status: DC | PRN
  Administered 2016-03-17: 4 mg via INTRAVENOUS

## 2016-03-17 MED ORDER — HYDROMORPHONE HCL 1 MG/ML IJ SOLN
INTRAMUSCULAR | Status: AC
  Filled 2016-03-17: qty 1

## 2016-03-17 MED ORDER — SUGAMMADEX SODIUM 200 MG/2ML IV SOLN
INTRAVENOUS | Status: DC | PRN
  Administered 2016-03-17: 200 mg via INTRAVENOUS

## 2016-03-17 MED ORDER — PHENYLEPHRINE HCL 10 MG/ML IJ SOLN
INTRAMUSCULAR | Status: DC | PRN
  Administered 2016-03-17 (×4): 80 ug via INTRAVENOUS

## 2016-03-17 MED ORDER — LACTATED RINGERS IV SOLN
INTRAVENOUS | Status: DC | PRN
Start: 2016-03-17 — End: 2016-03-17
  Administered 2016-03-17: 12:00:00 via INTRAVENOUS

## 2016-03-17 MED ORDER — MAGNESIUM HYDROXIDE 400 MG/5ML PO SUSP
30.0000 mL | Freq: Every day | ORAL | Status: DC | PRN
  Filled 2016-03-17: qty 30

## 2016-03-17 MED ORDER — HYDROMORPHONE HCL 2 MG/ML IJ SOLN
2.0000 mg | Freq: Once | INTRAMUSCULAR | Status: AC
  Administered 2016-03-17: 2 mg via INTRAVENOUS

## 2016-03-17 MED ORDER — VANCOMYCIN HCL 10 G IV SOLR
1250.0000 mg | Freq: Two times a day (BID) | INTRAVENOUS | Status: DC
  Administered 2016-03-17 – 2016-03-19 (×5): 1250 mg via INTRAVENOUS
  Filled 2016-03-17 (×6): qty 1250

## 2016-03-17 MED ORDER — HYDROMORPHONE HCL 1 MG/ML IJ SOLN
0.2500 mg | INTRAMUSCULAR | Status: DC | PRN
  Administered 2016-03-17 (×2): 0.5 mg via INTRAVENOUS

## 2016-03-17 MED ORDER — FENTANYL CITRATE (PF) 100 MCG/2ML IJ SOLN
INTRAMUSCULAR | Status: AC
  Filled 2016-03-17: qty 2

## 2016-03-17 MED ORDER — SODIUM CHLORIDE 0.9 % IR SOLN
Status: DC | PRN
  Administered 2016-03-17: 3000 mL

## 2016-03-17 MED ORDER — EPHEDRINE SULFATE 50 MG/ML IJ SOLN
INTRAMUSCULAR | Status: DC | PRN
  Administered 2016-03-17: 10 mg via INTRAVENOUS
  Administered 2016-03-17: 15 mg via INTRAVENOUS
  Administered 2016-03-17: 5 mg via INTRAVENOUS

## 2016-03-17 MED ORDER — LACTATED RINGERS IV SOLN
INTRAVENOUS | Status: DC
  Administered 2016-03-17: 11:00:00 via INTRAVENOUS

## 2016-03-17 MED ORDER — PROPOFOL 10 MG/ML IV BOLUS
INTRAVENOUS | Status: DC | PRN
Start: 2016-03-17 — End: 2016-03-17
  Administered 2016-03-17: 150 mg via INTRAVENOUS

## 2016-03-17 MED ORDER — FENTANYL CITRATE (PF) 100 MCG/2ML IJ SOLN
INTRAMUSCULAR | Status: DC | PRN
  Administered 2016-03-17 (×2): 50 ug via INTRAVENOUS
  Administered 2016-03-17: 100 ug via INTRAVENOUS

## 2016-03-17 MED ORDER — MEPERIDINE HCL 50 MG/ML IJ SOLN: 6.2500 mg | INTRAMUSCULAR | Status: DC | PRN

## 2016-03-17 MED ORDER — PROMETHAZINE HCL 25 MG/ML IJ SOLN: 6.2500 mg | INTRAMUSCULAR | Status: DC | PRN

## 2016-03-17 MED ORDER — ROCURONIUM BROMIDE 10 MG/ML (PF) SYRINGE
PREFILLED_SYRINGE | INTRAVENOUS | Status: DC | PRN
  Administered 2016-03-17: 30 mg via INTRAVENOUS

## 2016-03-17 MED ORDER — MAGNESIUM SULFATE 2 GM/50ML IV SOLN
2.0000 g | Freq: Once | INTRAVENOUS | Status: AC
  Administered 2016-03-17: 2 g via INTRAVENOUS
  Filled 2016-03-17: qty 50

## 2016-03-17 MED ORDER — DEXTROSE 5 % IV SOLN
2.0000 g | INTRAVENOUS | Status: DC
  Administered 2016-03-17 – 2016-03-19 (×3): 2 g via INTRAVENOUS
  Filled 2016-03-17 (×5): qty 2

## 2016-03-17 MED ORDER — LIDOCAINE 2% (20 MG/ML) 5 ML SYRINGE
INTRAMUSCULAR | Status: DC | PRN
  Administered 2016-03-17: 40 mg via INTRAVENOUS

## 2016-03-17 MED ORDER — CHLORHEXIDINE GLUCONATE CLOTH 2 % EX PADS: 6.0000 | MEDICATED_PAD | Freq: Once | CUTANEOUS | Status: AC

## 2016-03-17 MED ORDER — MIDAZOLAM HCL 2 MG/2ML IJ SOLN
INTRAMUSCULAR | Status: DC | PRN
  Administered 2016-03-17: 2 mg via INTRAVENOUS

## 2016-03-17 MED ORDER — VANCOMYCIN HCL IN DEXTROSE 1-5 GM/200ML-% IV SOLN
INTRAVENOUS | Status: AC
  Filled 2016-03-17: qty 200

## 2016-03-17 MED ORDER — SODIUM CHLORIDE 0.9 % IV SOLN
510.0000 mg | Freq: Once | INTRAVENOUS | Status: AC
  Administered 2016-03-17: 510 mg via INTRAVENOUS
  Filled 2016-03-17: qty 17

## 2016-03-17 MED ORDER — SODIUM CHLORIDE 0.9 % IV SOLN
INTRAVENOUS | Status: DC | PRN
  Administered 2016-03-17: 13:00:00 via INTRAVENOUS

## 2016-03-17 MED ORDER — HYDROMORPHONE HCL 2 MG/ML IJ SOLN
INTRAMUSCULAR | Status: AC
  Filled 2016-03-17: qty 1

## 2016-03-17 MED ORDER — HYDROMORPHONE HCL 1 MG/ML IJ SOLN
INTRAMUSCULAR | Status: AC
  Administered 2016-03-17: 0.5 mg via INTRAVENOUS
  Filled 2016-03-17: qty 1

## 2016-03-17 SURGICAL SUPPLY — 37 items
BENZOIN TINCTURE PRP APPL 2/3 (GAUZE/BANDAGES/DRESSINGS) IMPLANT
BLADE HEX COATED 2.75 (ELECTRODE) IMPLANT
BLADE SURG SZ10 CARB STEEL (BLADE) IMPLANT
BNDG GAUZE ELAST 4 BULKY (GAUZE/BANDAGES/DRESSINGS) ×3 IMPLANT
CHLORAPREP W/TINT 26ML (MISCELLANEOUS) IMPLANT
CLOSURE WOUND 1/2 X4 (GAUZE/BANDAGES/DRESSINGS)
COVER SURGICAL LIGHT HANDLE (MISCELLANEOUS) ×3 IMPLANT
DECANTER SPIKE VIAL GLASS SM (MISCELLANEOUS) IMPLANT
DRAIN CHANNEL RND F F (WOUND CARE) IMPLANT
DRAPE LAPAROSCOPIC ABDOMINAL (DRAPES) ×3 IMPLANT
DRAPE LAPAROTOMY T 102X78X121 (DRAPES) IMPLANT
DRAPE LAPAROTOMY TRNSV 102X78 (DRAPE) IMPLANT
DRAPE SHEET LG 3/4 BI-LAMINATE (DRAPES) IMPLANT
ELECT REM PT RETURN 9FT ADLT (ELECTROSURGICAL) ×3
ELECTRODE REM PT RTRN 9FT ADLT (ELECTROSURGICAL) ×1 IMPLANT
EVACUATOR SILICONE 100CC (DRAIN) IMPLANT
GAUZE SPONGE 4X4 12PLY STRL (GAUZE/BANDAGES/DRESSINGS) ×3 IMPLANT
GLOVE BIOGEL PI IND STRL 7.0 (GLOVE) ×1 IMPLANT
GLOVE BIOGEL PI INDICATOR 7.0 (GLOVE) ×2
GLOVE SURG ORTHO 8.0 STRL STRW (GLOVE) ×3 IMPLANT
GOWN STRL REUS W/TWL LRG LVL3 (GOWN DISPOSABLE) IMPLANT
GOWN STRL REUS W/TWL XL LVL3 (GOWN DISPOSABLE) ×6 IMPLANT
KIT BASIN OR (CUSTOM PROCEDURE TRAY) ×3 IMPLANT
MARKER SKIN DUAL TIP RULER LAB (MISCELLANEOUS) IMPLANT
NEEDLE HYPO 25X1 1.5 SAFETY (NEEDLE) IMPLANT
NS IRRIG 1000ML POUR BTL (IV SOLUTION) ×3 IMPLANT
PACK GENERAL/GYN (CUSTOM PROCEDURE TRAY) ×3 IMPLANT
PAD ABD 8X10 STRL (GAUZE/BANDAGES/DRESSINGS) ×3 IMPLANT
SPONGE LAP 18X18 X RAY DECT (DISPOSABLE) IMPLANT
STAPLER VISISTAT 35W (STAPLE) ×3 IMPLANT
STRIP CLOSURE SKIN 1/2X4 (GAUZE/BANDAGES/DRESSINGS) IMPLANT
SUT ETHILON 3 0 PS 1 (SUTURE) IMPLANT
SUT MNCRL AB 4-0 PS2 18 (SUTURE) IMPLANT
SUT VIC AB 3-0 SH 18 (SUTURE) IMPLANT
SYR CONTROL 10ML LL (SYRINGE) IMPLANT
TAPE CLOTH SURG 4X10 WHT LF (GAUZE/BANDAGES/DRESSINGS) ×3 IMPLANT
TOWEL OR 17X26 10 PK STRL BLUE (TOWEL DISPOSABLE) ×3 IMPLANT

## 2016-03-17 NOTE — Anesthesia Procedure Notes (Signed)
Procedure Name: Intubation Date/Time: 03/17/2016 12:10 PM Performed by: Cynda Familia Pre-anesthesia Checklist: Patient identified, Emergency Drugs available, Suction available and Patient being monitored Patient Re-evaluated:Patient Re-evaluated prior to inductionOxygen Delivery Method: Circle System Utilized Preoxygenation: Pre-oxygenation with 100% oxygen Intubation Type: IV induction Ventilation: Mask ventilation without difficulty Laryngoscope Size: Miller and 2 Grade View: Grade I Tube type: Oral Number of attempts: 1 Airway Equipment and Method: Stylet Placement Confirmation: ETT inserted through vocal cords under direct vision,  positive ETCO2 and breath sounds checked- equal and bilateral Secured at: 22 cm Tube secured with: Tape Dental Injury: Teeth and Oropharynx as per pre-operative assessment  Comments: Smooth IV induction by Lissa Hoard--- intubation AM CRNA atraumatic-- teeth and mouth as preop-- very poor dentition-- loose and many missing teeth--- bilat BS

## 2016-03-17 NOTE — Progress Notes (Signed)
General Surgery [facility]  Assessment & Plan:  Sacral decubitus ulcer, unstageable  Plan operative debridement today in [place], MD, [facility]       Office: [phone]    Subjective: Patient awake and alert.  All questions regarding surgery answered.  Objective: Vital signs in last 24 hours: Temp:  [97.6 F (36.4 C)-99 F (37.2 C)] 98.4 F (36.9 C) (11/17 0400) Resp:  [8-24] 15 (11/17 0600) BP: (96-128)/(43-75) 112/59 (11/17 0600) SpO2:  [75 %-100 %] 98 % (11/17 0600) Weight:  [126 kg (277 lb 12.5 oz)] 126 kg (277 lb 12.5 oz) (11/17 0458) Last BM Date:  (PTA)  Intake/Output from previous day: 11/16 0701 - 11/17 0700 In: 800 [IV Piggyback:800] Out: 1725 [id number] Intake/Output this shift: No intake/output data recorded.  Physical Exam: No exam  Lab Results:   Recent Labs  03/16/16 0449 03/17/16 0455  WBC 11.2* 10.3  HGB 9.2* 10.3*  HCT 28.3* 32.5*  PLT 121* 122*   BMET  Recent Labs  03/16/16 0449 03/17/16 0455  NA 144 142  K 3.2* 3.9  CL 117* 112*  CO2 22 24  GLUCOSE 97 92  BUN 26* 19  CREATININE 0.64 0.70  CALCIUM 7.6* 8.3*   PT/INR No results for input(s): LABPROT, INR in the last 72 hours. Comprehensive Metabolic Panel:    Component Value Date/Time   NA 142 03/17/2016 0455   NA 144 03/16/2016 0449   NA 142 12/23/2015 1113   NA 144 12/02/2015 1355   K 3.9 03/17/2016 0455   K 3.2 (L) 03/16/2016 0449   K 4.2 12/23/2015 1113   K 4.5 12/02/2015 1355   CL 112 (H) 03/17/2016 0455   CL 117 (H) 03/16/2016 0449   CL 105 10/13/2015 1141   CL 107 09/30/2015 1049   CO2 24 03/17/2016 0455   CO2 22 03/16/2016 0449   CO2 20 (L) 12/23/2015 1113   CO2 19 (L) 12/02/2015 1355   BUN 19 03/17/2016 0455   BUN 26 (H) 03/16/2016 0449   BUN 25.6 12/23/2015 1113   BUN 44.5 (H) 12/02/2015 1355   CREATININE 0.70 03/17/2016 0455   CREATININE 0.64 03/16/2016 0449   CREATININE  1.1 12/23/2015 1113   CREATININE 1.3 (H) 12/02/2015 1355   GLUCOSE 92 03/17/2016 0455   GLUCOSE 97 03/16/2016 0449   GLUCOSE 148 (H) 12/23/2015 1113   GLUCOSE 112 12/02/2015 1355   GLUCOSE 106 10/13/2015 1141   GLUCOSE 90 09/30/2015 1049   CALCIUM 8.3 (L) 03/17/2016 0455   CALCIUM 7.6 (L) 03/16/2016 0449   CALCIUM 8.1 (L) 12/23/2015 1113   CALCIUM 8.6 12/02/2015 1355   AST 22 03/16/2016 0730   AST 23 03/14/2016 1758   AST 14 12/23/2015 1113   AST 18 12/02/2015 1355   ALT 19 03/16/2016 0730   ALT 22 03/14/2016 1758   ALT 14 12/23/2015 1113   ALT 36 12/02/2015 1355   ALKPHOS 65 03/16/2016 0730   ALKPHOS 80 03/14/2016 1758   ALKPHOS 64 12/23/2015 1113   ALKPHOS 64 12/02/2015 1355   BILITOT 0.7 03/16/2016 0730   BILITOT 1.2 03/14/2016 1758   BILITOT 0.46 12/23/2015 1113   BILITOT 0.35 12/02/2015 1355   PROT 5.6 (L) 03/16/2016 0730   PROT 6.7 03/14/2016 1758   PROT 6.2 (L) 12/23/2015 1113   PROT 6.3 (L) 12/02/2015 1355   ALBUMIN 2.5 (L) 03/16/2016  0730   ALBUMIN 3.1 (L) 03/14/2016 1758   ALBUMIN 3.1 (L) 12/23/2015 1113   ALBUMIN 3.3 (L) 12/02/2015 1355    Studies/Results: No results found.    Katherene Dinino M 03/17/2016  Patient ID: Kerri Vasquez, female   DOB: 09/19/1955, 60 y.o.   MRN: [id number]

## 2016-03-17 NOTE — Transfer of Care (Signed)
Immediate Anesthesia Transfer of Care Note  Patient: Kerri Vasquez  Procedure(s) Performed: Procedure(s): IRRIGATION AND DEBRIDIMENT SACRAL DECUBITUS ULCER (N/A)  Patient Location: PACU  Anesthesia Type:General  Level of Consciousness: sedated  Airway & Oxygen Therapy: Patient Spontanous Breathing and Patient connected to face mask oxygen  Post-op Assessment: Report given to RN and Post -op Vital signs reviewed and stable  Post vital signs: Reviewed and stable  Last Vitals:  Vitals:   03/17/16 1030 03/17/16 1045  BP:    Pulse:    Resp: (!) 21 17  Temp:      Last Pain:  Vitals:   03/17/16 0800  TempSrc:   PainSc: Asleep         Complications: No apparent anesthesia complications

## 2016-03-17 NOTE — Brief Op Note (Signed)
03/14/2016 - 03/17/2016  12:58 PM  PATIENT:  Kerri Vasquez  60 y.o. female  PRE-OPERATIVE DIAGNOSIS:  sacral decubitus ulcer, unstageable, necrotic  POST-OPERATIVE DIAGNOSIS:  same  PROCEDURE:  Excisional debridement of sacral decubitus ulcer (18x15x4cm, skin, SQ, muscle, and fascia)  SURGEON:  Surgeon(s) and Role:    * Armandina Gemma, MD - Primary  ANESTHESIA:   general  EBL:  Total I/O In: 73 [IV Piggyback:50] Out: -   BLOOD ADMINISTERED:none  DRAINS: none   LOCAL MEDICATIONS USED:  NONE  SPECIMEN:  Source of Specimen:  wound cultures  DISPOSITION OF SPECIMEN:  PATHOLOGY  COUNTS:  YES  TOURNIQUET:  * No tourniquets in log *  DICTATION: .Other Dictation: Dictation Number [id number]  PLAN OF CARE: Admit to inpatient   PATIENT DISPOSITION:  PACU - hemodynamically stable.   Delay start of Pharmacological VTE agent (>24hrs) due to surgical blood loss or risk of bleeding: yes  Earnstine Regal, MD, [facility] Office: [phone]

## 2016-03-17 NOTE — Progress Notes (Signed)
[name] will be going to surgery today. She will have debridement of her decubitus. This is on the sacrum. Hopefully this will help her a little bit.  Her prealbumin is only 9.9. This is quite low. Hopefully, she will be able to get an nutrition to try to help heal up the surgical wound.  Her iron studies are low which is no surprise. Her iron saturation is only 21%. Her ferritin is 3700 which is an acute phase reactant.  She has fairly decent pain control.  So far, her cultures have been negative.  She's had no cough or shortness of breath.  Her CA 27.29 is up more. It is now 500. It is a little better than I would've thought.  On her physical exam, her blood pressure is 112/59. Her temperature 98.4. Her pulse is 87. There really is no change in her physical exam from yesterday.  I know that she is getting outstanding care by everybody down in the ICU. The staff is doing a tremendous job taking care of her.  For now, we will continue to follow along. We will give her the iron.  I'm sure that Dr. Harlow Asa will be able to do a great job in debriding out the ulcer.  Lattie Haw, MD  Darlyn Chamber 29:11

## 2016-03-17 NOTE — Anesthesia Postprocedure Evaluation (Signed)
Anesthesia Post Note  Patient: Kerri Vasquez  Procedure(s) Performed: Procedure(s) (LRB): IRRIGATION AND DEBRIDIMENT SACRAL DECUBITUS ULCER (N/A)  Patient location during evaluation: PACU Anesthesia Type: General Level of consciousness: sedated and patient cooperative Pain management: pain level controlled Vital Signs Assessment: post-procedure vital signs reviewed and stable Respiratory status: spontaneous breathing Cardiovascular status: stable Anesthetic complications: no    Last Vitals:  Vitals:   03/17/16 1600 03/17/16 1700  BP: 133/71   Pulse:    Resp: 11 11  Temp:      Last Pain:  Vitals:   03/17/16 1740  TempSrc:   PainSc: 10-Worst pain ever                 Nolon Nations

## 2016-03-17 NOTE — Anesthesia Preprocedure Evaluation (Signed)
Anesthesia Evaluation  Patient identified by MRN, date of birth, ID band Patient awake    Reviewed: Allergy & Precautions, H&P , NPO status , Patient's Chart, lab work & pertinent test results, reviewed documented beta blocker date and time   Airway Mallampati: II  TM Distance: >3 FB Neck ROM: full    Dental  (+) Chipped, Missing,    Pulmonary shortness of breath, sleep apnea and Continuous Positive Airway Pressure Ventilation , pneumonia,    Pulmonary exam normal breath sounds clear to auscultation       Cardiovascular hypertension, Pt. on medications and Pt. on home beta blockers + Past MI and +CHF   Rhythm:regular Rate:Normal  Echo 03/15/2016 - Left ventricle: The cavity size was normal. There was mild concentric hypertrophy. Systolic function was vigorous. The estimated ejection fraction was in the range of 65% to 70%. Wall motion was normal; there were no regional wall motion abnormalities. Doppler parameters are consistent with abnormal left ventricular relaxation (grade 1 diastolic dysfunction). Doppler parameters are consistent with indeterminate ventricular filling pressure. - Aortic valve: Transvalvular velocity was within the normal range. There was no stenosis. There was no regurgitation. - Mitral valve: Transvalvular velocity was within the normal range. There was no evidence for stenosis. There was no regurgitation. - Right ventricle: The cavity size was normal. Wall thickness was normal. Systolic function was normal. - Tricuspid valve: There was no regurgitation.   Neuro/Psych  Headaches, PSYCHIATRIC DISORDERS Depression  Neuromuscular disease    GI/Hepatic Neg liver ROS, GERD  Medicated and Controlled,  Endo/Other  diabetes, Well Controlled, Type 2, Oral Hypoglycemic AgentsHypothyroidism Morbid obesity  Renal/GU Renal InsufficiencyRenal disease     Musculoskeletal  (+) Arthritis , Rheumatoid disorders,   Fibromyalgia -, narcotic dependent  Abdominal (+) + obese,   Peds  Hematology  (+) Blood dyscrasia, anemia , Pernicious anemia 2013   Anesthesia Other Findings Breast Ca  Reproductive/Obstetrics negative OB ROS                             Anesthesia Physical  Anesthesia Plan  ASA: III  Anesthesia Plan: General   Post-op Pain Management:    Induction: Intravenous  Airway Management Planned: Oral ETT  Additional Equipment: None  Intra-op Plan:   Post-operative Plan: Extubation in OR  Informed Consent: I have reviewed the patients History and Physical, chart, labs and discussed the procedure including the risks, benefits and alternatives for the proposed anesthesia with the patient or authorized representative who has indicated his/her understanding and acceptance.   Dental advisory given  Plan Discussed with: CRNA  Anesthesia Plan Comments:         Anesthesia Quick Evaluation

## 2016-03-17 NOTE — Progress Notes (Signed)
PROGRESS NOTE  Kerri Vasquez [id number] DOB: Oct 18, 1955 DOA: 03/14/2016 PCP: [name], MD  HPI/Recap of past 24 hours:  Worsening pain   Assessment/Plan: Principal Problem:   Acute respiratory failure with hypoxia (HCC) Active Problems:   Breast cancer metastasized to liver (HCC)   Chronic diastolic CHF (congestive heart failure) (HCC)   Tachycardia   Decubitus ulcer of sacral region, stage 3 (HCC)   CAP (community acquired pneumonia)   Sepsis ([place])   Acute hypoxic respiratory failure as tolerated: CTA no PE, troponin negative,  actue hypoxic respiratory failure Likely from sepsis/bilateral pna and diastolic chf exacerbation, sedation from opiods analgesia could also contribute per EMS, hypoxic to 70% on RA, currently on [place] oxygen between 2 to 3 lit , wean as tolerated.   Hypotension:  Patient developed significant hypotension the first night of admission, sbp in the 60's and 70's, PCCM consulted and recommendations given.  Hypotension Likely from sepsis, but patient does has low bp during last hospitalization, she was put on florinef Treat sepsis, continue florinef, avoid sedation. Resolved.    Bilateral pna / sepsis with hypotension, leukocytosis, lactic acidosis, hypoxia on presentation: mrsa screening positive, blood culture no growth, sputum culture collected, result pending, wound swab result pending Patient also has several unstageble decubitus ulcers, the one on sacrum with malodorous drainage, general surgery consulted and he underwent debridement on 11/17. Resume IV antibiotics, wean her off oxygen as tolerated.   Acute on chronic diastolic chf exacerbation:  She was discharged on lasix 40mg  po qd a month ago, which was held on admission for hypotension and restarted it after blood pressures improved.    Hypothyroidism: patient in the past noncompliant with synthroid supplement,  continue synthroid,  tsh 2.2 ( this has much improved  compare to a month ago ) She will need to have repeat tsh in 4weeks to ensure stability, it seems her tsh has fluctuated in the past  Chronic pain: she was discharged on fentanyl 31mcg q72hr a month ago, she report take 19mcg instead. she is also on oxycodone 20mg  q4hrs prn ( she was discharged on 15mg  q4hr prn a month ago),  on lyrica, zanaflex at bedtime, ibuprofen   Constipation: with chronic pain meds, bedridden status and hypothyroidism, patient is prone to constipation, start sennakot and miralax.no BM yet. Will start her on MOM.    Metastatic breast cancer, with liver mets and possible mets to omentum, she has progressive weakness, has been bedridden for the last two months, she is off chemotherapy, oncology Dr Leticia Clas and recommendations given.   Sacral decubitus ulcer presented on admission  Wound care consulted "Wound type:stage IV sacral wound, unstageable R lateral ankle and R lateral leg just proximal to ankle, stage I R lateral calf Pressure Ulcer POA: Yes Measurement:sacrum 6cm x 7.5cm x 6cm 60% black, 40% red granulating tissue, small amt drainage, malodorous (consistant with necrotic tissue) R lateral ankle 0.5cm x 0.5cm x 0.2cm unstageable 100% slough no odor, small amt drainage R lateral leg 0.5cm x 0.5cm x 0.1cm 100% slough, no drainage, or odor Right lateral lower leg stage I 7cm x 2cm  Left lateral ankle 1cm x 2cm unstageable, 100% slough no drainage or odor Wound bed:see above Drainage (amount, consistency, odor) see above Periwound:see above  Dressing procedure/placement/frequency:Pt is on a therapeutic mattress with low air loss feature and will require this support surface when transfers to floor.  Staff provided with guidance to minimize layers of bed linens.surgical consult. hydrotherapy daily  followed by 3 days of twice daily dressings with sodium hypochlorite ([place], quarter percent). Pressure redistribution heel boots have been in use at home and  will be continued here. Nutritional consult ordered for increased requirements for tissue repair and regeneration."  General surgery consulted and she underwent debridement of the sacral ulcer.   morbid obesity:  Body mass index is 44.16 kg/m.   FTT, bedridden , from home  Code Status: Full  Family Communication: patient alone.   Disposition Plan:  daughter agreed to SNF placement at discharge   Consultants:  Critical care  Oncology  General surgery  Procedures:  none  Antibiotics:  Vanc/rocephin/zithro   Objective: BP (!) 140/57   Pulse (!) 102   Temp (P) 98.8 F (37.1 C)   Resp 17   Ht 5' 6.5" (1.689 m)   Wt 126 kg (277 lb 12.5 oz)   LMP 08/30/2006   SpO2 96%   BMI 44.16 kg/m   Intake/Output Summary (Last 24 hours) at 03/17/16 1340 Last data filed at 03/17/16 1327  Gross per 24 hour  Intake             2450 ml  Output             3175 ml  Net             -725 ml   Filed Weights   03/14/16 2200 03/16/16 0500 03/17/16 0458  Weight: 120.4 kg (265 lb 6.9 oz) 131.6 kg (290 lb 2 oz) 126 kg (277 lb 12.5 oz)    Exam:   General:  Obese, chronically ill, aaox3  Cardiovascular: RRR  Respiratory: diminished at basis  Abdomen: Soft/ND/NT, positive BS  Musculoskeletal: chronic right upper extremity Edema ( lymphodema from breast cancer surgery), bilateral lower extremity pitting edema, able to move toes and attempt to lift both legs off the bed  Neuro: aaox3  Data Reviewed: Basic Metabolic Panel:  Recent Labs Lab 03/14/16 1758 03/15/16 0422 03/16/16 0449 03/17/16 0455  NA 147* 145 144 142  K 3.5 3.2* 3.2* 3.9  CL 115* 114* 117* 112*  CO2 23 24 22 24   GLUCOSE 122* 185* 97 92  BUN 36* 34* 26* 19  CREATININE 0.81 0.80 0.64 0.70  CALCIUM 9.0 7.8* 7.6* 8.3*  MG  --   --  1.7 1.6*   Liver Function Tests:  Recent Labs Lab 03/14/16 1758 03/16/16 0730  AST 23 22  ALT 22 19  ALKPHOS 80 65  BILITOT 1.2 0.7  PROT 6.7 5.6*  ALBUMIN 3.1*  2.5*   No results for input(s): LIPASE, AMYLASE in the last 168 hours. No results for input(s): AMMONIA in the last 168 hours. CBC:  Recent Labs Lab 03/14/16 1758 03/16/16 0449 03/17/16 0455  WBC 13.4* 11.2* 10.3  NEUTROABS 11.9* 9.5*  --   HGB 12.5 9.2* 10.3*  HCT 38.1 28.3* 32.5*  MCV 100.0 101.8* 103.2*  PLT 149* 121* 122*   Cardiac Enzymes:    Recent Labs Lab 03/15/16 0422 03/15/16 0942 03/15/16 1530  TROPONINI <0.03 <0.03 <0.03   BNP (last 3 results)  Recent Labs  03/14/16 1758  BNP 162.1*    ProBNP (last 3 results) No results for input(s): PROBNP in the last 8760 hours.  CBG:  Recent Labs Lab 03/17/16 1337  GLUCAP 105*    Recent Results (from the past 240 hour(s))  MRSA PCR Screening     Status: Abnormal   Collection Time: 03/14/16  9:30 PM  Result Value Ref Range  Status   MRSA by PCR POSITIVE (A) NEGATIVE Final    Comment:        The GeneXpert MRSA Assay (FDA approved for NASAL specimens only), is one component of a comprehensive MRSA colonization surveillance program. It is not intended to diagnose MRSA infection nor to guide or monitor treatment for MRSA infections. RESULT CALLED TO, READ BACK BY AND VERIFIED WITH: [facility] RN [phone] [id number] [place],N   Culture, blood (routine x 2) Call MD if unable to obtain prior to antibiotics being given     Status: None (Preliminary result)   Collection Time: 03/15/16 12:40 AM  Result Value Ref Range Status   Specimen Description BLOOD PORTA CATH  Final   Special Requests BOTTLES DRAWN AEROBIC AND ANAEROBIC 10 CC  Final   Culture   Final    NO GROWTH 1 DAY Performed at [facility]    Report Status PENDING  Incomplete  Aerobic Culture (superficial specimen)     Status: None (Preliminary result)   Collection Time: 03/16/16  4:00 PM  Result Value Ref Range Status   Specimen Description SACRAL  Final   Special Requests NONE  Final   Gram Stain   Final    RARE WBC PRESENT, PREDOMINANTLY  PMN FEW GRAM VARIABLE ROD RARE GRAM POSITIVE COCCI IN PAIRS    Culture   Final    TOO YOUNG TO READ Performed at [facility]    Report Status PENDING  Incomplete     Studies: No results found.  Scheduled Meds: . [MAR Hold] azithromycin  500 mg Intravenous Q24H  . [MAR Hold] bisacodyl  10 mg Rectal Daily  . [MAR Hold] cefTRIAXone (ROCEPHIN)  IV  1 g Intravenous Q24H  . [MAR Hold] Chlorhexidine Gluconate Cloth  6 each Topical Q0600  . Chlorhexidine Gluconate Cloth  6 each Topical Once  . [MAR Hold] fentaNYL  100 mcg Transdermal Q72H  . [MAR Hold] fludrocortisone  100 mcg Oral Daily  . [MAR Hold] FLUoxetine  40 mg Oral Daily  . [MAR Hold] furosemide  40 mg Intravenous Daily  . [MAR Hold] guaiFENesin  600 mg Oral BID  . [MAR Hold] levothyroxine  200 mcg Oral QAC breakfast  . [MAR Hold] lipase/protease/amylase  24,000 Units Oral TID AC  . [MAR Hold] mouth rinse  15 mL Mouth Rinse BID  . [MAR Hold] multivitamin with minerals  1 tablet Oral Daily  . [MAR Hold] mupirocin ointment  1 application Nasal BID  . [MAR Hold] pantoprazole  40 mg Oral BID  . [MAR Hold] polyethylene glycol  17 g Oral Daily  . [MAR Hold] pregabalin  200 mg Oral BID  . [MAR Hold] senna-docusate  1 tablet Oral BID  . [MAR Hold] sodium hypochlorite   Irrigation BID  . [MAR Hold] tiZANidine  4 mg Oral QHS  . [MAR Hold] vancomycin  1,000 mg Intravenous Q12H    Continuous Infusions: . lactated ringers 50 mL/hr at 03/17/16 1114  . lactated ringers 10 mL/hr at 03/17/16 1154     Time spent: 1mins  Joanthony Hamza MD,   [facility] Pager [phone]  If 7PM-7AM, please contact night-coverage at [email or web address], password [facility] 03/17/2016, 1:40 PM  LOS: 3 days

## 2016-03-17 NOTE — Progress Notes (Addendum)
Pharmacy Antibiotic Note  Kerri Vasquez is a 60 y.o. female admitted on 03/14/2016 with pneumonia and sepsis; started on Rocephin/Zithromax per MD. Currently on D3 vancomycin added for clinical deterioration.  Today, 03/17/2016: WBC/SCr improved, remains afebrile Positive MRSA screen  Plan:  Given improved renal function, will increase Vancomycin to 1250 mg IV q12 hr; goal trough 15-20 mcg/mL  Measure vancomycin trough levels at steady state as indicated  Have adjusted Rocephin to 2 g IV q24 given obesity   Height: 5' 6.5" (168.9 cm) Weight: 277 lb 12.5 oz (126 kg) IBW/kg (Calculated) : 60.45  Temp (24hrs), Avg:98.2 F (36.8 C), Min:97.6 F (36.4 C), Max:99 F (37.2 C)   Recent Labs Lab 03/14/16 1758 03/15/16 0422 03/15/16 0942 03/16/16 0449 03/17/16 0455  WBC 13.4*  --   --  11.2* 10.3  CREATININE 0.81 0.80  --  0.64 0.70  LATICACIDVEN  --  2.1* 1.0  --   --     Estimated Creatinine Clearance: 101.1 mL/min (by C-G formula based on SCr of 0.7 mg/dL).    Allergies  Allergen Reactions  . Influenza Vac Split [Flu Virus Vaccine] Other (See Comments)    Joint stiffness and renal failure  . Pneumococcal Vaccines Other (See Comments)    "sepsis and renal failure"  . Ritalin [Methylphenidate Hcl]     "Heart attack"  . Zostavax [Zoster Vaccine Live ([employer])] Other (See Comments)    Joint stiffness, renal failure  . Adhesive [Tape] Itching and Rash    Antimicrobials this admission:  Ceftriaxone 11/15 >> (11/21) Azithromycin 11/15 >> (11/21) Vancomycin 11/15 >>  Dose adjustments this admission:   Microbiology results:  11/15 BCx (x1): NG1D 11/14 MRSA PCR: positive 11/16 superficial wound Cx: few GVR, GPCs in pairs 11/17 deep wound Cx: sent  Thank you for allowing pharmacy to be a part of this patient's care.  Reuel Boom, PharmD, BCPS Pager: [phone] 03/17/2016, 2:38 PM

## 2016-03-17 NOTE — Procedures (Signed)
Pt does not wish to wear cpap will inform RT if anything changes. 

## 2016-03-17 NOTE — Progress Notes (Signed)
PT Cancellation Note  Patient Details Name: Kerri Vasquez MRN: [id number] DOB: 05-Dec-1955   Cancelled Treatment:    Reason Eval/Treat Not Completed: Patient at procedure or test/unavailable. Pt to have surgical debridement of sacral wound today. Will hold hydrotherapy today. Will follow and continue hydrotherapy if recommended by surgery after procedure. Thanks.    Weston Anna, MPT Pager: [phone]

## 2016-03-18 LAB — BASIC METABOLIC PANEL
ANION GAP: 6 (ref 5–15)
BUN: 18 mg/dL (ref 6–20)
CALCIUM: 8.2 mg/dL — AB (ref 8.9–10.3)
CHLORIDE: 111 mmol/L (ref 101–111)
CO2: 26 mmol/L (ref 22–32)
Creatinine, Ser: 0.54 mg/dL (ref 0.44–1.00)
GFR calc non Af Amer: >60 mL/min (ref >60)
GLUCOSE: 105 mg/dL — AB (ref 65–99)
Potassium: 3.1 mmol/L — ABNORMAL LOW (ref 3.5–5.1)
Sodium: 143 mmol/L (ref 135–145)

## 2016-03-18 MED ORDER — SODIUM CHLORIDE 0.9 % IV BOLUS (SEPSIS)
500.0000 mL | Freq: Once | INTRAVENOUS | Status: AC
  Administered 2016-03-18: 500 mL via INTRAVENOUS

## 2016-03-18 MED ORDER — POTASSIUM CHLORIDE CRYS ER 20 MEQ PO TBCR
40.0000 meq | EXTENDED_RELEASE_TABLET | Freq: Once | ORAL | Status: AC
  Administered 2016-03-18: 40 meq via ORAL
  Filled 2016-03-18: qty 2

## 2016-03-18 MED ORDER — FLUDROCORTISONE ACETATE 0.1 MG PO TABS
100.0000 ug | ORAL_TABLET | Freq: Two times a day (BID) | ORAL | Status: DC
  Administered 2016-03-18 – 2016-03-20 (×6): 100 ug via ORAL
  Filled 2016-03-18 (×6): qty 1

## 2016-03-18 MED ORDER — FENTANYL 25 MCG/HR TD PT72
25.0000 ug | MEDICATED_PATCH | TRANSDERMAL | Status: DC
  Administered 2016-03-18: 25 ug via TRANSDERMAL
  Filled 2016-03-18: qty 1

## 2016-03-18 NOTE — Progress Notes (Signed)
Pt encouraged to wear nocturnal cpap, but she refused.  Pt stated she does not wear one or have a cpap machine at home.  Pt remains on 2lnc.  RN aware.  RT will monitor and assess as needed.

## 2016-03-18 NOTE — Progress Notes (Signed)
Pts. BP 79/48 (58) . HR 94. Pt. had received Zanaflex around 10:30 pm. Possible hypotension as a side effect? MD notified. NS 520ml bolus given per MD order.

## 2016-03-18 NOTE — Op Note (Signed)
NAMEMarland Kitchen  LACEY, VENDER NO.:  [id number]  MEDICAL RECORD NO.:  [id number]  LOCATION:  [id number]                         FACILITY:  [facility]  PHYSICIAN:  Earnstine Regal, MD      DATE OF BIRTH:  Dec 10, 1955  DATE OF PROCEDURE:  03/17/2016                              OPERATIVE REPORT   PREOPERATIVE DIAGNOSIS:  Sacral decubitus ulcer, necrotic, unstageable.  POSTOPERATIVE DIAGNOSIS:  Sacral decubitus ulcer, necrotic, unstageable.  PROCEDURE:  Excisional debridement of sacral decubitus ulcer (18 x 15 x 4 cm, skin, subcutaneous tissue, muscle, and fascia.)  ANESTHESIA:  General.  ESTIMATED BLOOD LOSS:  Minimal.  PREPARATION:  Betadine.  COMPLICATIONS:  None.  INDICATIONS:  The patient is a 60 year old female admitted to the medical service with sacral decubitus ulcer, which is unstageable. There is foul-smelling drainage and necrotic tissue.  Edges were undermined.  General Surgery was consulted for debridement.  BODY OF REPORT:  Procedure was done in OR #2 at the [facility].  The patient was brought to the operating room on a hospital bed.  After induction of general anesthesia, the patient was turned to a prone position on the operating room table and then prepped and draped in the usual aseptic fashion.  After ascertaining that an adequate level of anesthesia had been achieved and a time-out had been performed, the wound was dressed.  Aerobic and anaerobic cultures were obtained.  The skin edges were undermined significantly.  Using the electrocautery for hemostasis, the skin edges were debrided circumferentially excising ulcerated and necrotic tissue.  Dissection carried through skin and subcutaneous tissues.  Debridement was carried down to the gluteal musculature bilaterally.  Portions of muscle were debrided.  Fascia overlying the sacrum was debrided.  Hemostasis was achieved with the electrocautery.  Pulse lavage was performed in order to  cleanse the wound.  Hemostasis was again achieved with the electrocautery.  Wound was packed with Betadine-soaked 4 inch Kerlix gauze sponges.  This was followed by dry gauze followed by ABD pads, which were secured with Medipore tape.  The patient was awakened from anesthesia, taken out of the operating room to the recovery room in stable condition.  The patient tolerated the procedure well.   Earnstine Regal, MD, [facility] Office: [phone]    TMG/MEDQ  D:  03/17/2016  T:  03/18/2016  Job:  [id number]

## 2016-03-18 NOTE — Progress Notes (Signed)
PROGRESS NOTE  Kerri Vasquez [id number] DOB: 06/24/1955 DOA: 03/14/2016 PCP: [name], MD  HPI/Recap of past 24 hours:  Added fentanyl for persistent pain, discussed the results of CT with patient and his daughter at bedside.   Assessment/Plan: Principal Problem:   Acute respiratory failure with hypoxia (HCC) Active Problems:   Breast cancer metastasized to liver (HCC)   Chronic diastolic CHF (congestive heart failure) (HCC)   Tachycardia   Decubitus ulcer of sacral region, stage 3 (HCC)   CAP (community acquired pneumonia)   Sepsis ([place])   Acute hypoxic respiratory failure as tolerated: CTA no PE, troponin negative,  actue hypoxic respiratory failure Likely from sepsis/bilateral pna and diastolic chf exacerbation, sedation from opiods analgesia could also contribute per EMS, hypoxic to 70% on RA, currently onRA, weaned her off oxygen.   Hypotension:  Patient developed significant hypotension the first night of admission, sbp in the 60's and 70's, PCCM consulted and recommendations given.  Hypotension Likely from sepsis, but patient does has low bp during last hospitalization, she was put on florinef Treat sepsis, continue florinef, avoid sedation. Resolved.    Bilateral pna / sepsis with hypotension, leukocytosis, lactic acidosis, hypoxia on presentation: mrsa screening positive, blood culture no growth, sputum culture collected, result pending, wound swab result pending Patient also has several unstageble decubitus ulcers, the one on sacrum with malodorous drainage, general surgery consulted and he underwent debridement on 11/17. Resume IV antibiotics, weaned her off oxygen as tolerated.   Acute on chronic diastolic chf exacerbation:  She was discharged on lasix 40mg  po qd a month ago, which was held on admission for hypotension and restarted it after blood pressures improved.    Hypothyroidism: patient in the past noncompliant with synthroid  supplement,  continue synthroid,  tsh 2.2 ( this has much improved compare to a month ago ) She will need to have repeat tsh in 4weeks to ensure stability, it seems her tsh has fluctuated in the past  Chronic pain: she was discharged on fentanyl 68mcg q72hr a month ago, she report take 118mcg instead. she is also on oxycodone 20mg  q4hrs prn ( she was discharged on 15mg  q4hr prn a month ago),  on lyrica, zanaflex at bedtime, ibuprofen Resume fentanyl.    Constipation: with chronic pain meds, bedridden status and hypothyroidism, patient is prone to constipation, start sennakot and miralax.no BM yet. Will start her on MOM.    Metastatic breast cancer, with liver mets and possible mets to omentum, she has progressive weakness, has been bedridden for the last two months, she is off chemotherapy, oncology Dr Leticia Clas and recommendations given.   Sacral decubitus ulcer presented on admission  Wound care consulted "Wound type:stage IV sacral wound, unstageable R lateral ankle and R lateral leg just proximal to ankle, stage I R lateral calf Pressure Ulcer POA: Yes Measurement:sacrum 6cm x 7.5cm x 6cm 60% black, 40% red granulating tissue, small amt drainage, malodorous (consistant with necrotic tissue) R lateral ankle 0.5cm x 0.5cm x 0.2cm unstageable 100% slough no odor, small amt drainage R lateral leg 0.5cm x 0.5cm x 0.1cm 100% slough, no drainage, or odor Right lateral lower leg stage I 7cm x 2cm  Left lateral ankle 1cm x 2cm unstageable, 100% slough no drainage or odor Wound bed:see above Drainage (amount, consistency, odor) see above Periwound:see above  Dressing procedure/placement/frequency:Pt is on a therapeutic mattress with low air loss feature and will require this support surface when transfers to floor.  Staff provided  with guidance to minimize layers of bed linens.surgical consult. hydrotherapy daily followed by 3 days of twice daily dressings with sodium hypochlorite  ([place]ak[place], quarter percent). Pressure redistribution heel boots have been in use at home and will be continued here. Nutritional consult ordered for increased requirements for tissue repair and regeneration."  General surgery consulted and she underwent debridement of the sacral ulcer. Continue hydrotherapy .   morbid obesity:  Body mass index is 44.93 kg/m.   FTT, bedridden , from home  Code Status: Full  Family Communication: patient and daughter at bedside.   Disposition Plan:  daughter agreed to SNF placement at discharge   Consultants:  Critical care  Oncology  General surgery  Procedures:  none  Antibiotics:  Vanc/rocephin/zithro   Objective: BP 124/87 (BP Location: Left Leg)   Pulse (!) 102   Temp 97.7 F (36.5 C) (Oral)   Resp 16   Ht 5' 6.5" (1.689 m)   Wt 128.2 kg (282 lb 10.1 oz)   LMP 08/30/2006   SpO2 95%   BMI 44.93 kg/m   Intake/Output Summary (Last 24 hours) at 03/18/16 1718 Last data filed at 03/18/16 0957  Gross per 24 hour  Intake              800 ml  Output             1075 ml  Net             -275 ml   Filed Weights   03/16/16 0500 03/17/16 0458 03/18/16 0500  Weight: 131.6 kg (290 lb 2 oz) 126 kg (277 lb 12.5 oz) 128.2 kg (282 lb 10.1 oz)    Exam:   General:  Obese, chronically ill, aaox3  Cardiovascular: RRR  Respiratory: diminished at basis  Abdomen: Soft/ND/NT, positive BS  Musculoskeletal: chronic right upper extremity Edema ( lymphodema from breast cancer surgery), bilateral lower extremity pitting edema, able to move toes and attempt to lift both legs off the bed  Neuro: aaox3  Data Reviewed: Basic Metabolic Panel:  Recent Labs Lab 03/14/16 1758 03/15/16 0422 03/16/16 0449 03/17/16 0455 03/18/16 0446  NA 147* 145 144 142 143  K 3.5 3.2* 3.2* 3.9 3.1*  CL 115* 114* 117* 112* 111  CO2 23 24 22 24 26   GLUCOSE 122* 185* 97 92 105*  BUN 36* 34* 26* 19 18  CREATININE 0.81 0.80 0.64 0.70 0.54  CALCIUM 9.0  7.8* 7.6* 8.3* 8.2*  MG  --   --  1.7 1.6*  --    Liver Function Tests:  Recent Labs Lab 03/14/16 1758 03/16/16 0730  AST 23 22  ALT 22 19  ALKPHOS 80 65  BILITOT 1.2 0.7  PROT 6.7 5.6*  ALBUMIN 3.1* 2.5*   No results for input(s): LIPASE, AMYLASE in the last 168 hours. No results for input(s): AMMONIA in the last 168 hours. CBC:  Recent Labs Lab 03/14/16 1758 03/16/16 0449 03/17/16 0455  WBC 13.4* 11.2* 10.3  NEUTROABS 11.9* 9.5*  --   HGB 12.5 9.2* 10.3*  HCT 38.1 28.3* 32.5*  MCV 100.0 101.8* 103.2*  PLT 149* 121* 122*   Cardiac Enzymes:    Recent Labs Lab 03/15/16 0422 03/15/16 0942 03/15/16 1530  TROPONINI <0.03 <0.03 <0.03   BNP (last 3 results)  Recent Labs  03/14/16 1758  BNP 162.1*    ProBNP (last 3 results) No results for input(s): PROBNP in the last 8760 hours.  CBG:  Recent Labs Lab 03/17/16 [place]  105*    Recent Results (from the past 240 hour(s))  MRSA PCR Screening     Status: Abnormal   Collection Time: 03/14/16  9:30 PM  Result Value Ref Range Status   MRSA by PCR POSITIVE (A) NEGATIVE Final    Comment:        The GeneXpert MRSA Assay (FDA approved for NASAL specimens only), is one component of a comprehensive MRSA colonization surveillance program. It is not intended to diagnose MRSA infection nor to guide or monitor treatment for MRSA infections. RESULT CALLED TO, READ BACK BY AND VERIFIED WITH: [facility] RN [phone] [id number] [place],N   Culture, blood (routine x 2) Call MD if unable to obtain prior to antibiotics being given     Status: None (Preliminary result)   Collection Time: 03/15/16 12:40 AM  Result Value Ref Range Status   Specimen Description BLOOD PORTA CATH  Final   Special Requests BOTTLES DRAWN AEROBIC AND ANAEROBIC 10 CC  Final   Culture   Final    NO GROWTH 3 DAYS Performed at [facility]    Report Status PENDING  Incomplete  Aerobic Culture (superficial specimen)     Status: None  (Preliminary result)   Collection Time: 03/16/16  4:00 PM  Result Value Ref Range Status   Specimen Description SACRAL  Final   Special Requests NONE  Final   Gram Stain   Final    RARE WBC PRESENT, PREDOMINANTLY PMN FEW GRAM VARIABLE ROD RARE GRAM POSITIVE COCCI IN PAIRS    Culture   Final    CULTURE REINCUBATED FOR BETTER GROWTH Performed at [facility]    Report Status PENDING  Incomplete  Aerobic/Anaerobic Culture (surgical/deep wound)     Status: None (Preliminary result)   Collection Time: 03/17/16 12:57 PM  Result Value Ref Range Status   Specimen Description WOUND SACRAL  Final   Special Requests NONE  Final   Gram Stain   Final    RARE WBC PRESENT,BOTH PMN AND MONONUCLEAR FEW GRAM POSITIVE RODS MODERATE GRAM NEGATIVE RODS FEW GRAM POSITIVE COCCI IN PAIRS    Culture   Final    CULTURE REINCUBATED FOR BETTER GROWTH Performed at [facility]    Report Status PENDING  Incomplete     Studies: No results found.  Scheduled Meds: . azithromycin  500 mg Intravenous Q24H  . bisacodyl  10 mg Rectal Daily  . cefTRIAXone (ROCEPHIN)  IV  2 g Intravenous Q24H  . Chlorhexidine Gluconate Cloth  6 each Topical Q0600  . fentaNYL  25 mcg Transdermal Q72H  . fludrocortisone  100 mcg Oral BID  . FLUoxetine  40 mg Oral Daily  . guaiFENesin  600 mg Oral BID  . levothyroxine  200 mcg Oral QAC breakfast  . lipase/protease/amylase  24,000 Units Oral TID AC  . mouth rinse  15 mL Mouth Rinse BID  . multivitamin with minerals  1 tablet Oral Daily  . mupirocin ointment  1 application Nasal BID  . pantoprazole  40 mg Oral BID  . polyethylene glycol  17 g Oral Daily  . pregabalin  200 mg Oral BID  . senna-docusate  1 tablet Oral BID  . tiZANidine  4 mg Oral QHS  . vancomycin  1,250 mg Intravenous Q12H    Continuous Infusions:    Time spent: 62mins  Treyvonne Tata MD,   [facility] Pager [phone]  If 7PM-7AM, please contact night-coverage at [email or web address],  password [facility] 03/18/2016, 5:18 PM  LOS: 4 days

## 2016-03-18 NOTE — Progress Notes (Signed)
PT Cancellation Note  Patient Details Name: Kerri Vasquez MRN: [id number] DOB: 03/06/1956   Cancelled Treatment:    Reason Eval/Treat Not Completed: Pt transferring from ICU to med surg. Unable to complete hydrotherapy tx. Will perform tx on tomorrow.    Weston Anna, MPT Pager: [phone]

## 2016-03-18 NOTE — Progress Notes (Signed)
General Surgery [facility]  Assessment & Plan:  Sacral decubitus ulcer, unstageable  Pt improved since debridement.    Dressing changes BID.          Milus Height, MD, [facility]       Office: [phone]    Subjective: Patient awake and alert. States that she feels quite a bit better since OR yesterday.  Eating breakfast.  Wound changed at 4 am and was clean according to RN.   Objective: Vital signs in last 24 hours: Temp:  [96.7 F (35.9 C)-98.8 F (37.1 C)] 98.1 F (36.7 C) (11/18 0400) Pulse Rate:  [102-112] 102 (11/17 1435) Resp:  [5-21] 16 (11/18 0800) BP: (79-152)/(46-106) 109/74 (11/18 0800) SpO2:  [94 %-100 %] 96 % (11/18 0800) Weight:  [128.2 kg (282 lb 10.1 oz)] 128.2 kg (282 lb 10.1 oz) (11/18 0500) Last BM Date: 03/13/16  Intake/Output from previous day: 11/17 0701 - 11/18 0700 In: 2275 [I.V.:1675; IV Piggyback:600] Out: 3625 [Urine:3575; Blood:50] Intake/Output this shift: No intake/output data recorded.  Physical Exam: A&O x 3. RR&R Breathing comfortably Abdomen soft, non tender, non distended.    Lab Results:   Recent Labs  03/16/16 0449 03/17/16 0455  WBC 11.2* 10.3  HGB 9.2* 10.3*  HCT 28.3* 32.5*  PLT 121* 122*   BMET  Recent Labs  03/17/16 0455 03/18/16 0446  NA 142 143  K 3.9 3.1*  CL 112* 111  CO2 24 26  GLUCOSE 92 105*  BUN 19 18  CREATININE 0.70 0.54  CALCIUM 8.3* 8.2*   PT/INR No results for input(s): LABPROT, INR in the last 72 hours. Comprehensive Metabolic Panel:    Component Value Date/Time   NA 143 03/18/2016 0446   NA 142 03/17/2016 0455   NA 142 12/23/2015 1113   NA 144 12/02/2015 1355   K 3.1 (L) 03/18/2016 0446   K 3.9 03/17/2016 0455   K 4.2 12/23/2015 1113   K 4.5 12/02/2015 1355   CL 111 03/18/2016 0446   CL 112 (H) 03/17/2016 0455   CL 105 10/13/2015 1141   CL 107 09/30/2015 1049   CO2 26 03/18/2016 0446   CO2 24 03/17/2016 0455   CO2  20 (L) 12/23/2015 1113   CO2 19 (L) 12/02/2015 1355   BUN 18 03/18/2016 0446   BUN 19 03/17/2016 0455   BUN 25.6 12/23/2015 1113   BUN 44.5 (H) 12/02/2015 1355   CREATININE 0.54 03/18/2016 0446   CREATININE 0.70 03/17/2016 0455   CREATININE 1.1 12/23/2015 1113   CREATININE 1.3 (H) 12/02/2015 1355   GLUCOSE 105 (H) 03/18/2016 0446   GLUCOSE 92 03/17/2016 0455   GLUCOSE 148 (H) 12/23/2015 1113   GLUCOSE 112 12/02/2015 1355   GLUCOSE 106 10/13/2015 1141   GLUCOSE 90 09/30/2015 1049   CALCIUM 8.2 (L) 03/18/2016 0446   CALCIUM 8.3 (L) 03/17/2016 0455   CALCIUM 8.1 (L) 12/23/2015 1113   CALCIUM 8.6 12/02/2015 1355   AST 22 03/16/2016 0730   AST 23 03/14/2016 1758   AST 14 12/23/2015 1113   AST 18 12/02/2015 1355   ALT 19 03/16/2016 0730   ALT 22 03/14/2016 1758   ALT 14 12/23/2015 1113   ALT 36 12/02/2015 1355   ALKPHOS 65 03/16/2016 0730   ALKPHOS 80 03/14/2016 1758   ALKPHOS 64 12/23/2015 1113   ALKPHOS 64 12/02/2015 1355   BILITOT 0.7 03/16/2016 0730   BILITOT 1.2 03/14/2016  1758   BILITOT 0.46 12/23/2015 1113   BILITOT 0.35 12/02/2015 1355   PROT 5.6 (L) 03/16/2016 0730   PROT 6.7 03/14/2016 1758   PROT 6.2 (L) 12/23/2015 1113   PROT 6.3 (L) 12/02/2015 1355   ALBUMIN 2.5 (L) 03/16/2016 0730   ALBUMIN 3.1 (L) 03/14/2016 1758   ALBUMIN 3.1 (L) 12/23/2015 1113   ALBUMIN 3.3 (L) 12/02/2015 1355    Studies/Results: No results found.    Keatyn Luck 03/18/2016  Patient ID: Virgie Dad, female   DOB: Nov 22, 1955, 60 y.o.   MRN: [id number]

## 2016-03-19 DIAGNOSIS — I5032 Chronic diastolic (congestive) heart failure: Secondary | ICD-10-CM

## 2016-03-19 LAB — BASIC METABOLIC PANEL
ANION GAP: 3 — AB (ref 5–15)
BUN: 17 mg/dL (ref 6–20)
CALCIUM: 8.3 mg/dL — AB (ref 8.9–10.3)
CHLORIDE: 114 mmol/L — AB (ref 101–111)
CO2: 26 mmol/L (ref 22–32)
Creatinine, Ser: 0.55 mg/dL (ref 0.44–1.00)
GFR calc Af Amer: >60 mL/min (ref >60)
GFR calc non Af Amer: >60 mL/min (ref >60)
GLUCOSE: 93 mg/dL (ref 65–99)
Potassium: 3.6 mmol/L (ref 3.5–5.1)
Sodium: 143 mmol/L (ref 135–145)

## 2016-03-19 MED ORDER — HYDROMORPHONE HCL 1 MG/ML IJ SOLN
1.0000 mg | Freq: Once | INTRAMUSCULAR | Status: AC
  Administered 2016-03-19: 1 mg via INTRAVENOUS
  Filled 2016-03-19: qty 1

## 2016-03-19 MED ORDER — AZITHROMYCIN 250 MG PO TABS
500.0000 mg | ORAL_TABLET | Freq: Every day | ORAL | Status: DC
  Administered 2016-03-19: 500 mg via ORAL
  Filled 2016-03-19: qty 2

## 2016-03-19 NOTE — Progress Notes (Signed)
   03/19/16 1300 Hydrotherapy treatment note [id number]   Subjective Assessment  Subjective patient lethargic.  Patient and Family Stated Goals none stated  Date of Onset (unknown)  Evaluation and Treatment  Evaluation and Treatment Procedures Explained to Patient/Family Yes  Evaluation and Treatment Procedures agreed to  Pressure Injury 03/14/16 Unstageable - Full thickness tissue loss in which the base of the ulcer is covered by slough (yellow, tan, gray, green or brown) and/or eschar (tan, brown or black) in the wound bed. eschar   Date First Assessed/Time First Assessed: 03/14/16 2145   Location: Sacrum  Location Orientation: Mid  Staging: Unstageable - Full thickness tissue loss in which the base of the ulcer is covered by slough (yellow, tan, gray, green or brown) and/or esch...  Dressing Type ABD;Moist to dry;Gauze (Comment) (saline kerlix)  Dressing Changed  Dressing Change Frequency Daily  State of Healing Eschar  Site / Wound Assessment Black;Pink;Bleeding;Painful  % Wound base Red or Granulating 30%  % Wound base Black/Eschar 70%  Peri-wound Assessment Bleeding;Excoriated;Pink  Wound Length (cm) 7 cm  Wound Width (cm) 6.5 cm  Wound Depth (cm) 6 cm  Margins Unattached edges (unapproximated)  Drainage Amount Copious  Drainage Description Odor;Purulent  Hydrotherapy  Pulsed lavage therapy - wound location sacrum  Pulsed Lavage with Suction (psi) 8 psi  Pulsed Lavage with Suction - Normal Saline Used 1000 mL  Pulsed Lavage Tip Tip with splash shield  Selective Debridement  Selective Debridement - Location sacrum  Selective Debridement - Tools Used Forceps;Scissors  Selective Debridement - Tissue Removed necrotic   Wound Therapy - Assess/Plan/Recommendations  Wound Therapy - Clinical Statement The  patient  is moaning during treatment even with IV pain meds. The wound is very large. TThe patient reuires extensive assistance to roll. to positiion for treatment.  Wound  Therapy - Functional Problem List Per chart, pt is bedbound  Factors Delaying/Impairing Wound Healing Immobility;Multiple medical problems  Hydrotherapy Plan Debridement;Dressing change;Patient/family education;Pulsatile lavage with suction  Wound Therapy - Frequency 6X / week  Wound Therapy - Current Recommendations Case manager/social work (surgery in to assess the wound during hydro evaluation)  Wound Therapy - Follow Up Recommendations Skilled nursing facility;Home health RN  Wound Plan Hydrotherapy and dressing changes to promote wound healing  Wound Therapy Goals - Improve the function of patient's integumentary system by progressing the wound(s) through the phases of wound healing by:  Decrease Necrotic Tissue to 40%  Decrease Necrotic Tissue - Progress Progressing toward goal  Increase Granulation Tissue to 60%  Increase Granulation Tissue - Progress Progressing toward goal  Improve Drainage Characteristics Min  Improve Drainage Characteristics - Progress Progressing toward goal  Goals/treatment plan/discharge plan were made with and agreed upon by patient/family Yes  Time For Goal Achievement 2 weeks  Wound Therapy - Potential for Goals Alfredo Martinez PT [phone]

## 2016-03-19 NOTE — Progress Notes (Signed)
General Surgery [facility]  Assessment & Plan:  Sacral decubitus ulcer, unstageable  Continue wound care.    PT to do hydrotherapy.        Milus Height, MD, [facility]       Office: [phone]    Subjective: Pt sleeping.  Dressing changed in early morning hours, so did not wake her back up.    Objective: Vital signs in last 24 hours: Temp:  [97.5 F (36.4 C)-98.1 F (36.7 C)] 97.6 F (36.4 C) (11/19 0502) Resp:  [8-20] 20 (11/19 0502) BP: (93-124)/(46-87) 101/62 (11/19 0502) SpO2:  [95 %-98 %] 97 % (11/19 0502) Weight:  [127.2 kg (280 lb 6.8 oz)] 127.2 kg (280 lb 6.8 oz) (11/19 0502) Last BM Date: 03/14/16 (per pt)  Intake/Output from previous day: 11/18 0701 - 11/19 0700 In: 270 [IV Piggyback:250] Out: 300 [Urine:300] Intake/Output this shift: No intake/output data recorded.  Physical Exam: Sleeping.  Marland Kitchen RR&R Breathing comfortably   Lab Results:   Recent Labs  03/17/16 0455  WBC 10.3  HGB 10.3*  HCT 32.5*  PLT 122*   BMET  Recent Labs  03/18/16 0446 03/19/16 0500  NA 143 143  K 3.1* 3.6  CL 111 114*  CO2 26 26  GLUCOSE 105* 93  BUN 18 17  CREATININE 0.54 0.55  CALCIUM 8.2* 8.3*   PT/INR No results for input(s): LABPROT, INR in the last 72 hours. Comprehensive Metabolic Panel:    Component Value Date/Time   NA 143 03/19/2016 0500   NA 143 03/18/2016 0446   NA 142 12/23/2015 1113   NA 144 12/02/2015 1355   K 3.6 03/19/2016 0500   K 3.1 (L) 03/18/2016 0446   K 4.2 12/23/2015 1113   K 4.5 12/02/2015 1355   CL 114 (H) 03/19/2016 0500   CL 111 03/18/2016 0446   CL 105 10/13/2015 1141   CL 107 09/30/2015 1049   CO2 26 03/19/2016 0500   CO2 26 03/18/2016 0446   CO2 20 (L) 12/23/2015 1113   CO2 19 (L) 12/02/2015 1355   BUN 17 03/19/2016 0500   BUN 18 03/18/2016 0446   BUN 25.6 12/23/2015 1113   BUN 44.5 (H) 12/02/2015 1355   CREATININE 0.55 03/19/2016 0500   CREATININE 0.54  03/18/2016 0446   CREATININE 1.1 12/23/2015 1113   CREATININE 1.3 (H) 12/02/2015 1355   GLUCOSE 93 03/19/2016 0500   GLUCOSE 105 (H) 03/18/2016 0446   GLUCOSE 148 (H) 12/23/2015 1113   GLUCOSE 112 12/02/2015 1355   GLUCOSE 106 10/13/2015 1141   GLUCOSE 90 09/30/2015 1049   CALCIUM 8.3 (L) 03/19/2016 0500   CALCIUM 8.2 (L) 03/18/2016 0446   CALCIUM 8.1 (L) 12/23/2015 1113   CALCIUM 8.6 12/02/2015 1355   AST 22 03/16/2016 0730   AST 23 03/14/2016 1758   AST 14 12/23/2015 1113   AST 18 12/02/2015 1355   ALT 19 03/16/2016 0730   ALT 22 03/14/2016 1758   ALT 14 12/23/2015 1113   ALT 36 12/02/2015 1355   ALKPHOS 65 03/16/2016 0730   ALKPHOS 80 03/14/2016 1758   ALKPHOS 64 12/23/2015 1113   ALKPHOS 64 12/02/2015 1355   BILITOT 0.7 03/16/2016 0730   BILITOT 1.2 03/14/2016 1758   BILITOT 0.46 12/23/2015 1113   BILITOT 0.35 12/02/2015 1355   PROT 5.6 (L) 03/16/2016 0730   PROT 6.7 03/14/2016 1758   PROT 6.2 (L) 12/23/2015 1113  PROT 6.3 (L) 12/02/2015 1355   ALBUMIN 2.5 (L) 03/16/2016 0730   ALBUMIN 3.1 (L) 03/14/2016 1758   ALBUMIN 3.1 (L) 12/23/2015 1113   ALBUMIN 3.3 (L) 12/02/2015 1355    Studies/Results: No results found.    Kerri Vasquez 03/19/2016  Patient ID: Kerri Vasquez, female   DOB: 01-04-1956, 60 y.o.   MRN: [id number]

## 2016-03-19 NOTE — Progress Notes (Signed)
PROGRESS NOTE  Kerri Vasquez [id number] DOB: 1955/09/18 DOA: 03/14/2016 PCP: [name], MD  HPI/Recap of past 24 hours:  No new complaints today.   Assessment/Plan: Principal Problem:   Acute respiratory failure with hypoxia (HCC) Active Problems:   Breast cancer metastasized to liver (HCC)   Chronic diastolic CHF (congestive heart failure) (HCC)   Tachycardia   Decubitus ulcer of sacral region, stage 3 (HCC)   CAP (community acquired pneumonia)   Sepsis ([place])   Acute hypoxic respiratory failure as tolerated: CTA no PE, troponin negative,  actue hypoxic respiratory failure Likely from sepsis/bilateral pna and diastolic chf exacerbation, sedation from opiods analgesia could also contribute per EMS, hypoxic to 70% on RA, on admission.   Hypotension:  Patient developed significant hypotension the first night of admission, sbp in the 60's and 70's, PCCM consulted and recommendations given.  Hypotension Likely from sepsis, but patient does has low bp during last hospitalization, she was put on florinef Treat sepsis, continue florinef, avoid sedation. Resolved.    Bilateral pna / sepsis with hypotension, leukocytosis, lactic acidosis, hypoxia on presentation: mrsa screening positive, blood culture no growth, sputum culture collected, result pending, wound swab result pending Patient also has several unstageble decubitus ulcers, the one on sacrum with malodorous drainage, general surgery consulted and he underwent debridement on 11/17. Resume IV antibiotics, weaned her off oxygen as tolerated.   Acute on chronic diastolic chf exacerbation:  She was discharged on lasix 40mg  po qd a month ago, which was held on admission for hypotension and restarted it after blood pressures improved.    Hypothyroidism: patient in the past noncompliant with synthroid supplement,  continue synthroid,  tsh 2.2 ( this has much improved compare to a month ago ) She will  need to have repeat tsh in 4weeks to ensure stability, it seems her tsh has fluctuated in the past  Chronic pain: she was discharged on fentanyl 29mcg q72hr a month ago, she report take 19mcg instead. she is also on oxycodone 20mg  q4hrs prn ( she was discharged on 15mg  q4hr prn a month ago),  on lyrica, zanaflex at bedtime, ibuprofen Resume fentanyl.    Constipation: with chronic pain meds, bedridden status and hypothyroidism, patient is prone to constipation, start sennakot and miralax and milk of magnesium. No BM today.   Metastatic breast cancer, with liver mets and possible mets to omentum, she has progressive weakness, has been bedridden for the last two months, she is off chemotherapy, oncology Dr Leticia Clas and recommendations given.   Sacral decubitus ulcer presented on admission  Wound care consulted "Wound type:stage IV sacral wound, unstageable R lateral ankle and R lateral leg just proximal to ankle, stage I R lateral calf Pressure Ulcer POA: Yes Measurement:sacrum 6cm x 7.5cm x 6cm 60% black, 40% red granulating tissue, small amt drainage, malodorous (consistant with necrotic tissue) R lateral ankle 0.5cm x 0.5cm x 0.2cm unstageable 100% slough no odor, small amt drainage R lateral leg 0.5cm x 0.5cm x 0.1cm 100% slough, no drainage, or odor Right lateral lower leg stage I 7cm x 2cm  Left lateral ankle 1cm x 2cm unstageable, 100% slough no drainage or odor Wound bed:see above Drainage (amount, consistency, odor) see above Periwound:see above  Dressing procedure/placement/frequency:Pt is on a therapeutic mattress with low air loss feature and will require this support surface when transfers to floor.  Staff provided with guidance to minimize layers of bed linens.surgical consult. hydrotherapy daily followed by 3 days of twice daily  dressings with sodium hypochlorite ([place], quarter percent). Pressure redistribution heel boots have been in use at home and will be continued  here. Nutritional consult ordered for increased requirements for tissue repair and regeneration."  General surgery consulted and she underwent debridement of the sacral ulcer. Continue hydrotherapy .   morbid obesity:  Body mass index is 44.58 kg/m.   FTT, bedridden , from home  Code Status: Full  Family Communication: none at bedside.   Disposition Plan:  daughter agreed to SNF placement at discharge   Consultants:  Critical care  Oncology  General surgery  Procedures:  none  Antibiotics:  Vanc/rocephin/zithro   Objective: BP (!) 86/66 (BP Location: Left Arm) Comment: rn notified   Pulse (!) 102   Temp 98.6 F (37 C) (Oral)   Resp 20   Ht 5' 6.5" (1.689 m)   Wt 127.2 kg (280 lb 6.8 oz)   LMP 08/30/2006   SpO2 96%   BMI 44.58 kg/m   Intake/Output Summary (Last 24 hours) at 03/19/16 1808 Last data filed at 03/19/16 1544  Gross per 24 hour  Intake              240 ml  Output              600 ml  Net             -360 ml   Filed Weights   03/17/16 0458 03/18/16 0500 03/19/16 0502  Weight: 126 kg (277 lb 12.5 oz) 128.2 kg (282 lb 10.1 oz) 127.2 kg (280 lb 6.8 oz)    Exam:   General:  Obese, chronically ill, aaox3  Cardiovascular: RRR  Respiratory: diminished at basis  Abdomen: Soft/ND/NT, positive BS  Musculoskeletal: chronic right upper extremity Edema ( lymphodema from breast cancer surgery), bilateral lower extremity pitting edema, able to move toes and attempt to lift both legs off the bed  Neuro: aaox3  Data Reviewed: Basic Metabolic Panel:  Recent Labs Lab 03/15/16 0422 03/16/16 0449 03/17/16 0455 03/18/16 0446 03/19/16 0500  NA 145 144 142 143 143  K 3.2* 3.2* 3.9 3.1* 3.6  CL 114* 117* 112* 111 114*  CO2 24 22 24 26 26   GLUCOSE 185* 97 92 105* 93  BUN 34* 26* 19 18 17   CREATININE 0.80 0.64 0.70 0.54 0.55  CALCIUM 7.8* 7.6* 8.3* 8.2* 8.3*  MG  --  1.7 1.6*  --   --    Liver Function Tests:  Recent Labs Lab  03/14/16 1758 03/16/16 0730  AST 23 22  ALT 22 19  ALKPHOS 80 65  BILITOT 1.2 0.7  PROT 6.7 5.6*  ALBUMIN 3.1* 2.5*   No results for input(s): LIPASE, AMYLASE in the last 168 hours. No results for input(s): AMMONIA in the last 168 hours. CBC:  Recent Labs Lab 03/14/16 1758 03/16/16 0449 03/17/16 0455  WBC 13.4* 11.2* 10.3  NEUTROABS 11.9* 9.5*  --   HGB 12.5 9.2* 10.3*  HCT 38.1 28.3* 32.5*  MCV 100.0 101.8* 103.2*  PLT 149* 121* 122*   Cardiac Enzymes:    Recent Labs Lab 03/15/16 0422 03/15/16 0942 03/15/16 1530  TROPONINI <0.03 <0.03 <0.03   BNP (last 3 results)  Recent Labs  03/14/16 1758  BNP 162.1*    ProBNP (last 3 results) No results for input(s): PROBNP in the last 8760 hours.  CBG:  Recent Labs Lab 03/17/16 1337  GLUCAP 105*    Recent Results (from the past 240 hour(s))  MRSA PCR Screening  Status: Abnormal   Collection Time: 03/14/16  9:30 PM  Result Value Ref Range Status   MRSA by PCR POSITIVE (A) NEGATIVE Final    Comment:        The GeneXpert MRSA Assay (FDA approved for NASAL specimens only), is one component of a comprehensive MRSA colonization surveillance program. It is not intended to diagnose MRSA infection nor to guide or monitor treatment for MRSA infections. RESULT CALLED TO, READ BACK BY AND VERIFIED WITH: [facility] RN [phone] [id number] [place],N   Culture, blood (routine x 2) Call MD if unable to obtain prior to antibiotics being given     Status: None (Preliminary result)   Collection Time: 03/15/16 12:40 AM  Result Value Ref Range Status   Specimen Description BLOOD PORTA CATH  Final   Special Requests BOTTLES DRAWN AEROBIC AND ANAEROBIC 10 CC  Final   Culture   Final    NO GROWTH 4 DAYS Performed at [facility]    Report Status PENDING  Incomplete  Aerobic Culture (superficial specimen)     Status: None (Preliminary result)   Collection Time: 03/16/16  4:00 PM  Result Value Ref Range Status    Specimen Description SACRAL  Final   Special Requests NONE  Final   Gram Stain   Final    RARE WBC PRESENT, PREDOMINANTLY PMN FEW GRAM VARIABLE ROD RARE GRAM POSITIVE COCCI IN PAIRS Performed at [facility]   Final   Report Status PENDING  Incomplete  Aerobic/Anaerobic Culture (surgical/deep wound)     Status: None (Preliminary result)   Collection Time: 03/17/16 12:57 PM  Result Value Ref Range Status   Specimen Description WOUND SACRAL  Final   Special Requests NONE  Final   Gram Stain   Final    RARE WBC PRESENT,BOTH PMN AND MONONUCLEAR FEW GRAM POSITIVE RODS MODERATE GRAM NEGATIVE RODS FEW GRAM POSITIVE COCCI IN PAIRS    Culture FEW PROTEUS MIRABILIS  Final   Report Status PENDING  Incomplete     Studies: No results found.  Scheduled Meds: . azithromycin  500 mg Oral QHS  . cefTRIAXone (ROCEPHIN)  IV  2 g Intravenous Q24H  . Chlorhexidine Gluconate Cloth  6 each Topical Q0600  . fentaNYL  25 mcg Transdermal Q72H  . fludrocortisone  100 mcg Oral BID  . FLUoxetine  40 mg Oral Daily  . guaiFENesin  600 mg Oral BID  . levothyroxine  200 mcg Oral QAC breakfast  . lipase/protease/amylase  24,000 Units Oral TID AC  . mouth rinse  15 mL Mouth Rinse BID  . multivitamin with minerals  1 tablet Oral Daily  . pantoprazole  40 mg Oral BID  . polyethylene glycol  17 g Oral Daily  . pregabalin  200 mg Oral BID  . senna-docusate  1 tablet Oral BID  . tiZANidine  4 mg Oral QHS  . vancomycin  1,250 mg Intravenous Q12H    Continuous Infusions:    Time spent: 39mins  Jizelle Conkey MD,   [facility] Pager [phone]  If 7PM-7AM, please contact night-coverage at [email or web address], password [facility] 03/19/2016, 6:08 PM  LOS: 5 days

## 2016-03-19 NOTE — Progress Notes (Signed)
PHARMACIST - PHYSICIAN COMMUNICATION DR:   Karleen Hampshire CONCERNING: Antibiotic IV to Oral Route Change Policy  RECOMMENDATION: This patient is receiving Zithromax by the intravenous route.  Based on criteria approved by the Pharmacy and Therapeutics Committee, the antibiotic(s) is/are being converted to the equivalent oral dose form(s).   DESCRIPTION: These criteria include:  Patient being treated for a respiratory tract infection, urinary tract infection, cellulitis or clostridium difficile associated diarrhea if on metronidazole  The patient is not neutropenic and does not exhibit a GI malabsorption state  The patient is eating (either orally or via tube) and/or has been taking other orally administered medications for a least 24 hours  The patient is improving clinically and has a Tmax < 100.5  If you have questions about this conversion, please contact the Pharmacy Department  []   [phone] )  Kerri Vasquez []   [phone] )  [facility] []   [phone] )  Kerri Vasquez []   [phone] )  [facility] [x]   [phone] )  [place], PharmD, BCPS Pager: [phone] 03/19/2016@9 :06 AM

## 2016-03-19 NOTE — Progress Notes (Signed)
Pt has refused CPAP for the night.  RT to monitor and assess as needed.  

## 2016-03-20 ENCOUNTER — Other Ambulatory Visit: Payer: Self-pay | Admitting: Hematology & Oncology

## 2016-03-20 DIAGNOSIS — R000 Tachycardia, unspecified: Secondary | ICD-10-CM | POA: Diagnosis not present

## 2016-03-20 DIAGNOSIS — J9621 Acute and chronic respiratory failure with hypoxia: Secondary | ICD-10-CM | POA: Diagnosis not present

## 2016-03-20 DIAGNOSIS — A4189 Other specified sepsis: Secondary | ICD-10-CM | POA: Diagnosis not present

## 2016-03-20 DIAGNOSIS — F339 Major depressive disorder, recurrent, unspecified: Secondary | ICD-10-CM | POA: Diagnosis not present

## 2016-03-20 DIAGNOSIS — I959 Hypotension, unspecified: Secondary | ICD-10-CM | POA: Diagnosis not present

## 2016-03-20 DIAGNOSIS — I509 Heart failure, unspecified: Secondary | ICD-10-CM | POA: Diagnosis not present

## 2016-03-20 DIAGNOSIS — L89153 Pressure ulcer of sacral region, stage 3: Secondary | ICD-10-CM | POA: Diagnosis not present

## 2016-03-20 DIAGNOSIS — I5031 Acute diastolic (congestive) heart failure: Secondary | ICD-10-CM | POA: Diagnosis not present

## 2016-03-20 DIAGNOSIS — J188 Other pneumonia, unspecified organism: Secondary | ICD-10-CM | POA: Diagnosis not present

## 2016-03-20 DIAGNOSIS — L89150 Pressure ulcer of sacral region, unstageable: Secondary | ICD-10-CM | POA: Diagnosis not present

## 2016-03-20 DIAGNOSIS — M6281 Muscle weakness (generalized): Secondary | ICD-10-CM | POA: Diagnosis not present

## 2016-03-20 DIAGNOSIS — C801 Malignant (primary) neoplasm, unspecified: Secondary | ICD-10-CM | POA: Diagnosis not present

## 2016-03-20 DIAGNOSIS — J189 Pneumonia, unspecified organism: Secondary | ICD-10-CM | POA: Diagnosis not present

## 2016-03-20 DIAGNOSIS — C50919 Malignant neoplasm of unspecified site of unspecified female breast: Secondary | ICD-10-CM | POA: Diagnosis not present

## 2016-03-20 DIAGNOSIS — E039 Hypothyroidism, unspecified: Secondary | ICD-10-CM | POA: Diagnosis not present

## 2016-03-20 DIAGNOSIS — Z7409 Other reduced mobility: Secondary | ICD-10-CM | POA: Diagnosis not present

## 2016-03-20 DIAGNOSIS — J9600 Acute respiratory failure, unspecified whether with hypoxia or hypercapnia: Secondary | ICD-10-CM | POA: Diagnosis not present

## 2016-03-20 DIAGNOSIS — K5900 Constipation, unspecified: Secondary | ICD-10-CM | POA: Diagnosis not present

## 2016-03-20 DIAGNOSIS — F411 Generalized anxiety disorder: Secondary | ICD-10-CM | POA: Diagnosis not present

## 2016-03-20 DIAGNOSIS — C787 Secondary malignant neoplasm of liver and intrahepatic bile duct: Secondary | ICD-10-CM | POA: Diagnosis not present

## 2016-03-20 DIAGNOSIS — I5032 Chronic diastolic (congestive) heart failure: Secondary | ICD-10-CM | POA: Diagnosis not present

## 2016-03-20 DIAGNOSIS — J9601 Acute respiratory failure with hypoxia: Secondary | ICD-10-CM | POA: Diagnosis not present

## 2016-03-20 DIAGNOSIS — R2689 Other abnormalities of gait and mobility: Secondary | ICD-10-CM | POA: Diagnosis not present

## 2016-03-20 LAB — AEROBIC CULTURE W GRAM STAIN (SUPERFICIAL SPECIMEN)

## 2016-03-20 LAB — CULTURE, BLOOD (ROUTINE X 2): Culture: NO GROWTH

## 2016-03-20 LAB — AEROBIC CULTURE  (SUPERFICIAL SPECIMEN)

## 2016-03-20 LAB — VANCOMYCIN, TROUGH: Vancomycin Tr: 39 ug/mL (ref 15–20)

## 2016-03-20 MED ORDER — CIPROFLOXACIN HCL 500 MG PO TABS: 500.0000 mg | ORAL_TABLET | Freq: Two times a day (BID) | ORAL | Status: DC

## 2016-03-20 MED ORDER — OXYCODONE HCL 20 MG PO TABS: 20.0000 mg | ORAL_TABLET | ORAL | 0 refills | Status: DC | PRN

## 2016-03-20 MED ORDER — CIPROFLOXACIN HCL 500 MG PO TABS: 500.0000 mg | ORAL_TABLET | Freq: Two times a day (BID) | ORAL | 0 refills | Status: DC

## 2016-03-20 MED ORDER — FLUDROCORTISONE ACETATE 0.1 MG PO TABS: 100.0000 ug | ORAL_TABLET | Freq: Two times a day (BID) | ORAL | Status: AC

## 2016-03-20 MED ORDER — FENTANYL 25 MCG/HR TD PT72: 25.0000 ug | MEDICATED_PATCH | TRANSDERMAL | 0 refills | Status: DC

## 2016-03-20 MED ORDER — LORAZEPAM 0.5 MG PO TABS: ORAL_TABLET | ORAL | 0 refills | Status: DC

## 2016-03-20 MED ORDER — HYDROMORPHONE HCL 1 MG/ML IJ SOLN
1.0000 mg | Freq: Once | INTRAMUSCULAR | Status: AC
  Administered 2016-03-20: 1 mg via INTRAVENOUS
  Filled 2016-03-20: qty 1

## 2016-03-20 NOTE — Progress Notes (Signed)
Pharmacy Antibiotic Note  Kerri Vasquez is a 60 y.o. female with a past medical history significant for stage IV breast cancer (currently not undergoing chemotherapy treatment), congestive heart failure, and a sacral decubitus ulcer. She presented to the [facility] on 03/14/16 with SOB x 2 days, chest tightness, a mild productive cough, and 3+ pitting edema BLE. Chest CT was positive for patchy areas of apparent pneumonia and negative for pulmonary embolus. Of note, she was admitted in the hospital on 02/02/16 for a rib fracture.   Ceftriaxone, Azithromycin, and Vancomycin were started for pneumonia and suspected sepsis.   Today, 03/20/2016: - Day 6 of azithromycin, ceftriaxone, and vancomycin.  - Vanc trough drawn this morning at 0930 is supratherapeutic at 39 - SCr stable - Continues to be afebrile - Blood cultures have been negative, wound culture w/ mixed organisms (proteus and morganella)   Plan:  Hold Vancomycin and re-check random vancomycin level with AM labs on 11/21. Will follow up on level and enter new maintenance dose if/when appropriate.   Monitor renal function and clinical course.   Continue Rocephin 2g IV q24h  Continue Azithromycin 500 mg po daily  Please indicate plan of therapy for antibiotics.  ----------------------------------------------------------------------------------------------- Height: 5' 6.5" (168.9 cm) Weight: 277 lb 9 oz (125.9 kg) IBW/kg (Calculated) : 60.45  Temp (24hrs), Avg:98.2 F (36.8 C), Min:98 F (36.7 C), Max:98.6 F (37 C)   Recent Labs Lab 03/14/16 1758 03/15/16 0422 03/15/16 0942 03/16/16 0449 03/17/16 0455 03/18/16 0446 03/19/16 0500 03/20/16 0922  WBC 13.4*  --   --  11.2* 10.3  --   --   --   CREATININE 0.81 0.80  --  0.64 0.70 0.54 0.55  --   LATICACIDVEN  --  2.1* 1.0  --   --   --   --   --   VANCOTROUGH  --   --   --   --   --   --   --  39*    Estimated Creatinine Clearance: 101.1 mL/min (by C-G formula  based on SCr of 0.55 mg/dL).    Allergies  Allergen Reactions  . Influenza Vac Split [Flu Virus Vaccine] Other (See Comments)    Joint stiffness and renal failure  . Pneumococcal Vaccines Other (See Comments)    "sepsis and renal failure"  . Ritalin [Methylphenidate Hcl]     "Heart attack"  . Zostavax [Zoster Vaccine Live ([employer])] Other (See Comments)    Joint stiffness, renal failure  . Adhesive [Tape] Itching and Rash    Antimicrobials this admission:  Ceftriaxone 11/15 >> (11/21) Azithromycin 11/15 >> (11/21) Vancomycin 11/15 >>  Dose adjustments this admission:  11/17: incr vanc to 1250 q12 with improved renal function and pos MRSA 11/20 @ 0930 VT= 39 on 1250mg  q12h (before 6th dose). Holding Vancomycin.   Microbiology results:  11/15 BCx (x1): ngtd 11/14 MRSA PCR: positive > Bactroban/CHG 11/16 superficial wound Cx: few GVR, GPCs in pairs 11/17 deep wound Cx: pending  Thank you for allowing pharmacy to be a part of this patient's care.  Sallyanne Havers Student-PharmD 03/20/2016, 10:27 AM

## 2016-03-20 NOTE — Clinical Social Work Placement (Signed)
Patient has a bed at [facility]. CSW has completed FL2 & will continue to follow and assist with discharge when ready.    Raynaldo Opitz, [facility] Clinical Social Worker cell #: [phone]     CLINICAL SOCIAL WORK PLACEMENT  NOTE  Date:  03/20/2016  Patient Details  Name: Kerri Vasquez MRN: [id number] Date of Birth: Sep 28, 1955  Clinical Social Work is seeking post-discharge placement for this patient at the [place] level of care (*CSW will initial, date and re-position this form in  chart as items are completed):  Yes   Patient/family provided with [place] Work Department's list of facilities offering this level of care within the geographic area requested by the patient (or if unable, by the patient's family).  Yes   Patient/family informed of their freedom to choose among providers that offer the needed level of care, that participate in Medicare, Medicaid or managed care program needed by the patient, have an available bed and are willing to accept the patient.  Yes   Patient/family informed of [place]'s ownership interest in [facility] and [facility], as well as of the fact that they are under no obligation to receive care at these facilities.  PASRR submitted to EDS on 03/20/16     PASRR number received on 03/20/16     Existing PASRR number confirmed on       FL2 transmitted to all facilities in geographic area requested by pt/family on 03/20/16     FL2 transmitted to all facilities within larger geographic area on       Patient informed that his/her managed care company has contracts with or will negotiate with certain facilities, including the following:        Yes   Patient/family informed of bed offers received.  Patient chooses bed at [facility]     Physician recommends and patient chooses bed at      Patient to be transferred to [facility] on  .  Patient to be transferred to facility by       Patient family notified on   of transfer.  Name of family member notified:        PHYSICIAN       Additional Comment:    _______________________________________________ Standley Brooking, LCSW 03/20/2016, 11:30 AM

## 2016-03-20 NOTE — Progress Notes (Signed)
   03/20/16 1500  Subjective Assessment  Subjective okay.  Patient and Family Stated Goals none stated  Date of Onset (unknown)  Evaluation and Treatment  Evaluation and Treatment Procedures Explained to Patient/Family Yes  Evaluation and Treatment Procedures agreed to  Pressure Injury 03/14/16 Unstageable - Full thickness tissue loss in which the base of the ulcer is covered by slough (yellow, tan, gray, green or brown) and/or eschar (tan, brown or black) in the wound bed. eschar   Date First Assessed/Time First Assessed: 03/14/16 2145   Location: Sacrum  Location Orientation: Mid  Staging: Unstageable - Full thickness tissue loss in which the base of the ulcer is covered by slough (yellow, tan, gray, green or brown) and/or esch...  Dressing Type (saline kerlix; 4x4 gauze; ABD; tape)  Dressing Changed  Dressing Change Frequency Twice a day  Site / Wound Assessment Yellow;Pink;Brown  % Wound base Red or Granulating 75%  % Wound base Black/Eschar 25%  Tunneling (cm) (7:00-9:00: 2-3 cm)  Margins Unattached edges (unapproximated)  Drainage Amount Copious  Drainage Description Odor  Treatment Debridement (Selective);Hydrotherapy (Pulse lavage);Packing (Saline gauze)  Hydrotherapy  Pulsed lavage therapy - wound location sacrum  Pulsed Lavage with Suction (psi) 8 psi  Pulsed Lavage with Suction - Normal Saline Used 1000 mL  Pulsed Lavage Tip Tip with splash shield  Selective Debridement  Selective Debridement - Location sacrum  Selective Debridement - Tools Used Forceps;Scissors  Selective Debridement - Tissue Removed yellow  Wound Therapy - Assess/Plan/Recommendations  Wound Therapy - Functional Problem List Poor nutrition status, bedbound prior to admission   Factors Delaying/Impairing Wound Healing Immobility;Multiple medical problems  Hydrotherapy Plan Debridement;Dressing change;Patient/family education;Pulsatile lavage with suction  Wound Therapy - Frequency 6X / week  Wound  Therapy - Current Recommendations Case manager/social work;Nutritionist  Wound Therapy - Follow Up Recommendations Skilled nursing facility  Wound Plan Hydrotherapy and dressing changes to promote wound healing   Kerri Vasquez, PT [phone]

## 2016-03-20 NOTE — Discharge Summary (Addendum)
Physician Discharge Summary  Kerri Vasquez [id number] DOB: [birth year] DOA: 03/14/2016  PCP: [name], MD  Admit date: 03/14/2016 Discharge date: 03/20/2016  Admitted From: home  Disposition: SNF  Recommendations for Outpatient Follow-up:  1. Follow up with PCP in 1-2 weeks 2. Please obtain BMP/CBC in one week Please follow up with oncology as recommended.  Please follow up with hydrotherapy.  Please follow up the results of the wound culture.  Please follow up with surgery as needed.    Discharge Condition:stable . CODE STATUS: full code.  Diet recommendation: Heart Healthy   Brief/Interim Summary: Kerri Vasquez is a 60 y.o. 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 CT chest showed possible pneumonia . Surgery consulted for debridement of the sacral decubitus and wound cultures sent showing proteus and morganella sps.   Discharge Diagnoses:  Principal Problem:   Acute respiratory failure with hypoxia ([place]) Active Problems:   Breast cancer metastasized to liver (HCC)   Chronic diastolic CHF (congestive heart failure) (HCC)   Tachycardia   Decubitus ulcer of sacral region, stage 3 (HCC)   CAP (community acquired pneumonia)   Sepsis ([place])    Acute hypoxic respiratory failure as tolerated: CTA no PE, troponin negative,  actue hypoxic respiratory failure Likely from sepsis/bilateral pna and diastolic chf exacerbation, sedation from opiods analgesia could also contribute per EMS, hypoxic to 70% on RA, on admission.  Currently off oxygen with good sats on RA.  Completed 7 days of IV antibiotics for the pneumonia.   Hypotension:  Patient developed  significant hypotension the first night of admission, sbp in the 60's and 70's, PCCM consulted and recommendations given.  Hypotension Likely from sepsis, but patient does has low bp during last hospitalization, she was put on florinef Treat sepsis, continue florinef, avoid sedation. Resolved.    Bilateral pna / sepsis with hypotension, leukocytosis, lactic acidosis, hypoxia on presentation: mrsa screening positive, blood culture no growth,  wound swab  Showing proteus and morganella sps.  Patient also has several unstageble decubitus ulcers, the one on sacrum with malodorous drainage, general surgery consulted and he underwent debridement on 11/17. Completed 7 days of IV antibiotics. She will be on ciprofloxacin to complete the course.    Acute on chronic diastolic chf exacerbation:  She was discharged on lasix 40mg  po qd a month ago, which was held on admission for hypotension and restarted it after blood pressures improved.    Hypothyroidism: patient in the past noncompliant with synthroid supplement,  continue synthroid,  tsh 2.2 ( this has much improved compare to a month ago ) She will need to have repeat tsh in 4weeks to ensure stability, it seems her tsh has fluctuated in the past  Chronic pain: currently on fentanyl ,  on oxycodone 20mg  q4hrs prn ( she was discharged on 15mg  q4hr prn a month ago),  on lyrica, zanaflex at bedtime,.    Constipation: with chronic pain meds, bedridden status and hypothyroidism, patient is prone to constipation, started sennakot and miralax and milk of magnesium.   Metastatic breast cancer, with liver mets and possible mets to omentum, she has progressive weakness, has been bedridden for the last two months, she is off chemotherapy, oncology Dr Leticia Clas and  recommendations given.   Sacral decubitus ulcer presented on admission  Wound care consulted "Wound type:stage IV sacral wound, unstageable R lateral ankle and R lateral  leg just proximal to ankle, stage I R lateral calf Pressure Ulcer POA: Yes Measurement:sacrum 6cm x 7.5cm x 6cm 60% black, 40% red granulating tissue, small amt drainage, malodorous (consistant with necrotic tissue) R lateral ankle 0.5cm x 0.5cm x 0.2cm unstageable 100% slough no odor, small amt drainage R lateral leg 0.5cm x 0.5cm x 0.1cm 100% slough, no drainage, or odor Right lateral lower leg stage I 7cm x 2cm  Left lateral ankle 1cm x 2cm unstageable, 100% slough no drainage or odor Wound bed:see above Drainage (amount, consistency, odor) see above Periwound:see above  Dressing procedure/placement/frequency:Pt is on a therapeutic mattress with low air loss feature and will require this support surface when transfers to floor. Staff provided with guidance to minimize layers of bed linens.surgical consult. hydrotherapy daily followed by 3 days of twice daily dressings with sodium hypochlorite (Dakins, quarter percent). Pressure redistribution heel boots have been in use at home and will be continued here. Nutritional consult ordered for increased requirements for tissue repair and regeneration."  General surgery consulted and she underwent debridement of the sacral ulcer. Continue hydrotherapy .   morbid obesity:  Body mass index is 44.58 kg/m.   FTT, bedridden , from home  Discharge Instructions  Discharge Instructions    Discharge instructions    Complete by:  As directed    PLEASE follow up with PCP in 1 to 2 weeks.  Please follow up with surgery as needed. Please continue with hydrotherapy.       Medication List    STOP taking these medications   dexamethasone 4 MG tablet Commonly known as:  DECADRON   diphenoxylate-atropine 2.5-0.025 MG tablet Commonly known as:  LOMOTIL   ibuprofen 600 MG tablet Commonly known as:  ADVIL,MOTRIN   tiZANidine 4 MG capsule Commonly known as:  ZANAFLEX     TAKE these medications   ciprofloxacin 500 MG tablet Commonly known  as:  CIPRO Take 1 tablet (500 mg total) by mouth 2 (two) times daily.   cyanocobalamin 1000 MCG/ML injection Commonly known as:  (VITAMIN B-12) INJECT 1 ML AS DIRECTED EVERY 21 DAYS   fentaNYL 25 MCG/HR patch Commonly known as:  DURAGESIC - dosed mcg/hr Place 1 patch (25 mcg total) onto the skin every 3 (three) days. What changed:  Another medication with the same name was removed. Continue taking this medication, and follow the directions you see here.   fludrocortisone 0.1 MG tablet Commonly known as:  FLORINEF Take 1 tablet (100 mcg total) by mouth 2 (two) times daily. What changed:  See the new instructions.   FLUoxetine 40 MG capsule Commonly known as:  PROZAC Take 40 mg by mouth daily before breakfast.   levothyroxine 200 MCG tablet Commonly known as:  SYNTHROID, LEVOTHROID Take 200 mcg by mouth daily with breakfast.   lidocaine-prilocaine cream Commonly known as:  EMLA Apply 1 application topically as needed for port access   LORazepam 0.5 MG tablet Commonly known as:  ATIVAN PLACE 1 TABLET UNDER TONGUE EVERY 6 HOURS AS NEEDED FOR NAUSEA AND VOMITING   methocarbamol 750 MG tablet Commonly known as:  ROBAXIN TAKE 1 TABLET BY MOUTH THREE TIMES DAILY AS NEEDED What changed:  See the new instructions.   MULTI-VITAMINS Tabs Take 1 tablet by mouth daily.   nitroGLYCERIN 0.4 MG SL tablet Commonly known as:  NITROSTAT Place  1 tablet (0.4 mg total) under the tongue every 5 (five) minutes as needed for chest pain.   ondansetron 8 MG tablet Commonly known as:  ZOFRAN TAKE 1 TABLET BY MOUTH EVERY 8 HOURS AS NEEDED FOR NAUSEA AND VOMITING.   Oxycodone HCl 20 MG Tabs Take 1 tablet (20 mg total) by mouth every 4 (four) hours as needed. What changed:  reasons to take this   Pancrelipase (Lip-Prot-Amyl) 24000-76000 units Cpep Take 1 capsule (24,000 Units total) by mouth 3 (three) times daily before meals.   pantoprazole 40 MG tablet Commonly known as:  PROTONIX Take 1  tablet (40 mg total) by mouth 2 (two) times daily.   polyethylene glycol packet Commonly known as:  MIRALAX / GLYCOLAX Take 17 g by mouth 2 (two) times daily. What changed:  when to take this  reasons to take this   pregabalin 200 MG capsule Commonly known as:  LYRICA Take 1 capsule (200 mg total) by mouth 2 (two) times daily.   prochlorperazine 10 MG tablet Commonly known as:  COMPAZINE TAKE 1 TABLET BY MOUTH EVERY 6 HOURS AS NEEDED FOR NAUSEA AND VOMITING   promethazine 25 MG tablet Commonly known as:  PHENERGAN TAKE 1 TABLET BY MOUTH EVERY 8 HOURS AS NEEDED FOR NAUSEA OR VOMITING.   Suvorexant 20 MG Tabs Commonly known as:  BELSOMRA Take 20 mg by mouth at bedtime as needed. What changed:  when to take this      Follow-up Information    [name], MD. Schedule an appointment as soon as possible for a visit in 1 week(s).   Specialty:  Family Medicine Contact information: [place] [address] [phone]          Allergies  Allergen Reactions  . Influenza Vac Split [Flu Virus Vaccine] Other (See Comments)    Joint stiffness and renal failure  . Pneumococcal Vaccines Other (See Comments)    "sepsis and renal failure"  . Ritalin [Methylphenidate Hcl]     "Heart attack"  . Zostavax [Zoster Vaccine Live ([employer])] Other (See Comments)    Joint stiffness, renal failure  . Adhesive [Tape] Itching and Rash    Consultations:  Surgery.   Procedures/Studies: Ct Angio Chest Pe W/cm &/or Wo Cm  Addendum Date: 03/14/2016   ADDENDUM REPORT: 03/14/2016 21:20 ADDENDUM: There is a mildly displaced eighth rib fracture on the right laterally. Electronically Signed   By: Lowella Grip III M.D.   On: 03/14/2016 21:20   Result Date: 03/14/2016 CLINICAL DATA:  Shortness of breath. Breast carcinoma. Tachycardia. EXAM: CT ANGIOGRAPHY CHEST WITH CONTRAST TECHNIQUE: Multidetector CT imaging of the chest was  performed using the standard protocol during bolus administration of intravenous contrast. Multiplanar CT image reconstructions and MIPs were obtained to evaluate the vascular anatomy. CONTRAST:  100 mL Isovue 370 nonionic COMPARISON:  Chest CT February 02, 2016; chest radiograph March 14, 2016 FINDINGS: Cardiovascular: There is no demonstrable pulmonary embolus. Ascending thoracic aorta measures 4.0 x 4.0 in transverse diameter; this measurement was obtained on axial slice 38 series 4. There is no demonstrable thoracic aortic dissection. The visualized great vessels appear normal. Note that the right and left common carotid arteries arise as a common trunk, an anatomic variant. There is no appreciable pericardial effusion. Port-A-Cath tip is at the cavoatrial junction. Mediastinum/Nodes: Surgical clips are noted in the right thyroid region consistent with hemi thyroidectomy. No thyroid lesion present currently. Left lobe thyroid diminutive. There is no  appreciable thoracic adenopathy. Lungs/Pleura: There is a focal opacity in the anterior right apex measuring 1.2 x 1.1 cm. Patchy opacity is noted in the right upper lobe with several ill-defined opacities in the right upper lobe, possibly representing small nodular lesions. There is airspace consolidation in a portion of the anterior segment of the left upper lobe. There is ill-defined patchy airspace opacity in the right lower lobe at multiple sites. There is atelectatic change in the left base. There is a small left pleural effusion. Upper Abdomen: In the visualized upper abdomen, liver metastases are again noted without change. The largest of these metastases arises in the anterior segment right lobe measuring 5.2 x 3.0 cm. Visualized upper abdominal structures otherwise appear unremarkable. Musculoskeletal: There is a breast implant on the right. There is degenerative change in the thoracic spine. No blastic or lytic bone lesions are evident. There is evidence  of a prior fracture of the posterior right tenth rib. There is a mildly displaced fracture of the lateral right eighth rib. There are no blastic or lytic bone lesions. Review of the MIP images confirms the above findings. IMPRESSION: No demonstrable pulmonary embolus. Ascending thoracic aorta measures 4.0 x 4.0 cm in diameter. Recommend annual imaging followup by CTA or MRA. This recommendation follows 2010 ACCF/AHA/AATS/ACR/ASA/SCA/SCAI/SIR/STS/SVM Guidelines for the Diagnosis and Management of Patients with Thoracic Aortic Disease. Circulation. 2010; 121SP:1689793. No dissection evident. Patchy areas of apparent pneumonia bilaterally. Nodular appearing opacity in the right apex measuring 1.2 x 1.1 cm raises concern for metastatic focus. There may be small metastases in the right upper lobe intermingled with patchy pneumonia. There is a rather minimal pericardial effusion. No appreciable thoracic adenopathy. Multiple liver metastases. Breast implant on the right. Electronically Signed: By: Lowella Grip III M.D. On: 03/14/2016 21:11   Dg Chest Port 1 View  Result Date: 03/14/2016 CLINICAL DATA:  Shortness of breath today, history breast cancer, CHF, diabetes mellitus, hypertension EXAM: PORTABLE CHEST 1 VIEW COMPARISON:  Portable exam 1638 hours compared 02/02/2016 FINDINGS: RIGHT jugular Port-A-Cath with tip projecting over cavoatrial junction. Enlargement of cardiac silhouette. Mediastinal contours and pulmonary vascularity grossly normal for technique. Peribronchial thickening without infiltrate, pleural effusion or pneumothorax. Diffuse osseous demineralization. IMPRESSION: Enlargement of cardiac silhouette. Mild bronchitic changes without infiltrate. Electronically Signed   By: Lavonia Dana M.D.   On: 03/14/2016 17:05    Incision and Debridement of the sacral wound.    Subjective:  no new complaints.   Discharge Exam: Vitals:   03/20/16 0535 03/20/16 1304  BP: (!) 102/47 (!) 90/57  Pulse:  84 97  Resp: 18 18  Temp: 98.1 F (36.7 C) 98.1 F (36.7 C)   Vitals:   03/19/16 2000 03/20/16 0535 03/20/16 0620 03/20/16 1304  BP: (!) 94/56 (!) 102/47  (!) 90/57  Pulse: (!) 113 84  97  Resp: 18 18  18   Temp: 98 F (36.7 C) 98.1 F (36.7 C)  98.1 F (36.7 C)  TempSrc: Oral   Oral  SpO2: 96% 98%  100%  Weight:   125.9 kg (277 lb 9 oz)   Height:        General: Pt is alert, awake, not in acute distress Cardiovascular: RRR, S1/S2 +, no rubs, no gallops Respiratory: CTA bilaterally, no wheezing, no rhonchi Abdominal: Soft, NT, ND, bowel sounds +     The results of significant diagnostics from this hospitalization (including imaging, microbiology, ancillary and laboratory) are listed below for reference.     Microbiology: Recent Results (  from the past 240 hour(s))  MRSA PCR Screening     Status: Abnormal   Collection Time: 03/14/16  9:30 PM  Result Value Ref Range Status   MRSA by PCR POSITIVE (A) NEGATIVE Final    Comment:        The GeneXpert MRSA Assay (FDA approved for NASAL specimens only), is one component of a comprehensive MRSA colonization surveillance program. It is not intended to diagnose MRSA infection nor to guide or monitor treatment for MRSA infections. RESULT CALLED TO, READ BACK BY AND VERIFIED WITH: [facility] RN [phone] [place],N   Culture, blood (routine x 2) Call MD if unable to obtain prior to antibiotics being given     Status: None   Collection Time: 03/15/16 12:40 AM  Result Value Ref Range Status   Specimen Description BLOOD PORTA CATH  Final   Special Requests BOTTLES DRAWN AEROBIC AND ANAEROBIC 10 CC  Final   Culture   Final    NO GROWTH 5 DAYS Performed at [facility]    Report Status 03/20/2016 FINAL  Final  Aerobic Culture (superficial specimen)     Status: None   Collection Time: 03/16/16  4:00 PM  Result Value Ref Range Status   Specimen Description SACRAL  Final   Special Requests NONE  Final   Gram Stain    Final    RARE WBC PRESENT, PREDOMINANTLY PMN FEW GRAM VARIABLE ROD RARE GRAM POSITIVE COCCI IN PAIRS Performed at [place] MORGANII   Final   Report Status 03/20/2016 FINAL  Final   Organism ID, Bacteria PROTEUS MIRABILIS  Final   Organism ID, Bacteria MORGANELLA MORGANII  Final      Susceptibility   Morganella morganii - MIC*    AMPICILLIN 16 RESISTANT Resistant     CEFAZOLIN >=64 RESISTANT Resistant     CEFEPIME <=1 SENSITIVE Sensitive     CEFTAZIDIME <=1 SENSITIVE Sensitive     CEFTRIAXONE <=1 SENSITIVE Sensitive     CIPROFLOXACIN <=0.25 SENSITIVE Sensitive     GENTAMICIN <=1 SENSITIVE Sensitive     IMIPENEM 4 SENSITIVE Sensitive     TRIMETH/SULFA <=20 SENSITIVE Sensitive     AMPICILLIN/SULBACTAM >=32 RESISTANT Resistant     PIP/TAZO <=4 SENSITIVE Sensitive     * FEW MORGANELLA MORGANII   Proteus mirabilis - MIC*    AMPICILLIN <=2 SENSITIVE Sensitive     CEFAZOLIN <=4 SENSITIVE Sensitive     CEFEPIME <=1 SENSITIVE Sensitive     CEFTAZIDIME <=1 SENSITIVE Sensitive     CEFTRIAXONE <=1 SENSITIVE Sensitive     CIPROFLOXACIN <=0.25 SENSITIVE Sensitive     GENTAMICIN <=1 SENSITIVE Sensitive     IMIPENEM 2 SENSITIVE Sensitive     TRIMETH/SULFA <=20 SENSITIVE Sensitive     AMPICILLIN/SULBACTAM <=2 SENSITIVE Sensitive     PIP/TAZO <=4 SENSITIVE Sensitive     * FEW PROTEUS MIRABILIS  Aerobic/Anaerobic Culture (surgical/deep wound)     Status: None (Preliminary result)   Collection Time: 03/17/16 12:57 PM  Result Value Ref Range Status   Specimen Description WOUND SACRAL  Final   Special Requests NONE  Final   Gram Stain   Final    RARE WBC PRESENT,BOTH PMN AND MONONUCLEAR FEW GRAM POSITIVE RODS MODERATE GRAM NEGATIVE RODS FEW GRAM POSITIVE COCCI IN PAIRS    Culture   Final    FEW PROTEUS MIRABILIS RARE STAPHYLOCOCCUS AUREUS SUSCEPTIBILITIES TO FOLLOW Performed at [facility]  Report Status PENDING   Incomplete   Organism ID, Bacteria PROTEUS MIRABILIS  Final      Susceptibility   Proteus mirabilis - MIC*    AMPICILLIN <=2 SENSITIVE Sensitive     CEFAZOLIN <=4 SENSITIVE Sensitive     CEFEPIME <=1 SENSITIVE Sensitive     CEFTAZIDIME <=1 SENSITIVE Sensitive     CEFTRIAXONE <=1 SENSITIVE Sensitive     CIPROFLOXACIN <=0.25 SENSITIVE Sensitive     GENTAMICIN <=1 SENSITIVE Sensitive     IMIPENEM 2 SENSITIVE Sensitive     TRIMETH/SULFA <=20 SENSITIVE Sensitive     AMPICILLIN/SULBACTAM <=2 SENSITIVE Sensitive     PIP/TAZO <=4 SENSITIVE Sensitive     * FEW PROTEUS MIRABILIS     Labs: BNP (last 3 results)  Recent Labs  03/14/16 1758  BNP [id number]*   Basic Metabolic Panel:  Recent Labs Lab 03/15/16 0422 03/16/16 0449 03/17/16 0455 03/18/16 0446 03/19/16 0500  NA 145 144 142 143 143  K 3.2* 3.2* 3.9 3.1* 3.6  CL 114* 117* 112* 111 114*  CO2 24 22 24 26 26   GLUCOSE 185* 97 92 105* 93  BUN 34* 26* 19 18 17   CREATININE 0.80 0.64 0.70 0.54 0.55  CALCIUM 7.8* 7.6* 8.3* 8.2* 8.3*  MG  --  1.7 1.6*  --   --    Liver Function Tests:  Recent Labs Lab 03/14/16 1758 03/16/16 0730  AST 23 22  ALT 22 19  ALKPHOS 80 65  BILITOT 1.2 0.7  PROT 6.7 5.6*  ALBUMIN 3.1* 2.5*   No results for input(s): LIPASE, AMYLASE in the last 168 hours. No results for input(s): AMMONIA in the last 168 hours. CBC:  Recent Labs Lab 03/14/16 1758 03/16/16 0449 03/17/16 0455  WBC 13.4* 11.2* 10.3  NEUTROABS 11.9* 9.5*  --   HGB 12.5 9.2* 10.3*  HCT 38.1 28.3* 32.5*  MCV 100.0 101.8* 103.2*  PLT 149* 121* 122*   Cardiac Enzymes:  Recent Labs Lab 03/15/16 0422 03/15/16 0942 03/15/16 1530  TROPONINI <0.03 <0.03 <0.03   BNP: Invalid input(s): POCBNP CBG:  Recent Labs Lab 03/17/16 1337  GLUCAP 105*   D-Dimer No results for input(s): DDIMER in the last 72 hours. Hgb A1c No results for input(s): HGBA1C in the last 72 hours. Lipid Profile No results for input(s): CHOL, HDL,  LDLCALC, TRIG, CHOLHDL, LDLDIRECT in the last 72 hours. Thyroid function studies No results for input(s): TSH, T4TOTAL, T3FREE, THYROIDAB in the last 72 hours.  Invalid input(s): FREET3 Anemia work up No results for input(s): VITAMINB12, FOLATE, FERRITIN, TIBC, IRON, RETICCTPCT in the last 72 hours. Urinalysis    Component Value Date/Time   COLORURINE AMBER (A) 02/06/2016 1953   APPEARANCEUR CLEAR 02/06/2016 1953   LABSPEC 1.028 02/06/2016 1953   PHURINE 6.0 02/06/2016 1953   GLUCOSEU NEGATIVE 02/06/2016 1953   HGBUR NEGATIVE 02/06/2016 1953   BILIRUBINUR SMALL (A) 02/06/2016 1953   BILIRUBINUR negative 05/24/2011 1630   KETONESUR NEGATIVE 02/06/2016 1953   PROTEINUR NEGATIVE 02/06/2016 1953   UROBILINOGEN 0.2 08/26/2014 1850   NITRITE NEGATIVE 02/06/2016 1953   LEUKOCYTESUR SMALL (A) 02/06/2016 1953   Sepsis Labs Invalid input(s): PROCALCITONIN,  WBC,  LACTICIDVEN Microbiology Recent Results (from the past 240 hour(s))  MRSA PCR Screening     Status: Abnormal   Collection Time: 03/14/16  9:30 PM  Result Value Ref Range Status   MRSA by PCR POSITIVE (A) NEGATIVE Final    Comment:        The GeneXpert  MRSA Assay (FDA approved for NASAL specimens only), is one component of a comprehensive MRSA colonization surveillance program. It is not intended to diagnose MRSA infection nor to guide or monitor treatment for MRSA infections. RESULT CALLED TO, READ BACK BY AND VERIFIED WITH: [facility] RN [phone] [place],N   Culture, blood (routine x 2) Call MD if unable to obtain prior to antibiotics being given     Status: None   Collection Time: 03/15/16 12:40 AM  Result Value Ref Range Status   Specimen Description BLOOD PORTA CATH  Final   Special Requests BOTTLES DRAWN AEROBIC AND ANAEROBIC 10 CC  Final   Culture   Final    NO GROWTH 5 DAYS Performed at [facility]    Report Status 03/20/2016 FINAL  Final  Aerobic Culture (superficial specimen)     Status: None    Collection Time: 03/16/16  4:00 PM  Result Value Ref Range Status   Specimen Description SACRAL  Final   Special Requests NONE  Final   Gram Stain   Final    RARE WBC PRESENT, PREDOMINANTLY PMN FEW GRAM VARIABLE ROD RARE GRAM POSITIVE COCCI IN PAIRS Performed at [place] MORGANII   Final   Report Status 03/20/2016 FINAL  Final   Organism ID, Bacteria PROTEUS MIRABILIS  Final   Organism ID, Bacteria MORGANELLA MORGANII  Final      Susceptibility   Morganella morganii - MIC*    AMPICILLIN 16 RESISTANT Resistant     CEFAZOLIN >=64 RESISTANT Resistant     CEFEPIME <=1 SENSITIVE Sensitive     CEFTAZIDIME <=1 SENSITIVE Sensitive     CEFTRIAXONE <=1 SENSITIVE Sensitive     CIPROFLOXACIN <=0.25 SENSITIVE Sensitive     GENTAMICIN <=1 SENSITIVE Sensitive     IMIPENEM 4 SENSITIVE Sensitive     TRIMETH/SULFA <=20 SENSITIVE Sensitive     AMPICILLIN/SULBACTAM >=32 RESISTANT Resistant     PIP/TAZO <=4 SENSITIVE Sensitive     * FEW MORGANELLA MORGANII   Proteus mirabilis - MIC*    AMPICILLIN <=2 SENSITIVE Sensitive     CEFAZOLIN <=4 SENSITIVE Sensitive     CEFEPIME <=1 SENSITIVE Sensitive     CEFTAZIDIME <=1 SENSITIVE Sensitive     CEFTRIAXONE <=1 SENSITIVE Sensitive     CIPROFLOXACIN <=0.25 SENSITIVE Sensitive     GENTAMICIN <=1 SENSITIVE Sensitive     IMIPENEM 2 SENSITIVE Sensitive     TRIMETH/SULFA <=20 SENSITIVE Sensitive     AMPICILLIN/SULBACTAM <=2 SENSITIVE Sensitive     PIP/TAZO <=4 SENSITIVE Sensitive     * FEW PROTEUS MIRABILIS  Aerobic/Anaerobic Culture (surgical/deep wound)     Status: None (Preliminary result)   Collection Time: 03/17/16 12:57 PM  Result Value Ref Range Status   Specimen Description WOUND SACRAL  Final   Special Requests NONE  Final   Gram Stain   Final    RARE WBC PRESENT,BOTH PMN AND MONONUCLEAR FEW GRAM POSITIVE RODS MODERATE GRAM NEGATIVE RODS FEW GRAM POSITIVE COCCI IN PAIRS     Culture   Final    FEW PROTEUS MIRABILIS RARE STAPHYLOCOCCUS AUREUS SUSCEPTIBILITIES TO FOLLOW Performed at [facility]    Report Status PENDING  Incomplete   Organism ID, Bacteria PROTEUS MIRABILIS  Final      Susceptibility   Proteus mirabilis - MIC*    AMPICILLIN <=2 SENSITIVE Sensitive     CEFAZOLIN <=4 SENSITIVE Sensitive     CEFEPIME <=1 SENSITIVE Sensitive  CEFTAZIDIME <=1 SENSITIVE Sensitive     CEFTRIAXONE <=1 SENSITIVE Sensitive     CIPROFLOXACIN <=0.25 SENSITIVE Sensitive     GENTAMICIN <=1 SENSITIVE Sensitive     IMIPENEM 2 SENSITIVE Sensitive     TRIMETH/SULFA <=20 SENSITIVE Sensitive     AMPICILLIN/SULBACTAM <=2 SENSITIVE Sensitive     PIP/TAZO <=4 SENSITIVE Sensitive     * FEW PROTEUS MIRABILIS     Time coordinating discharge: Over 30 minutes  SIGNED:   Hosie Poisson, MD  [facility] 03/20/2016, 1:52 PM Pager   If 7PM-7AM, please contact night-coverage [email or web address] Password [email or web address]

## 2016-03-20 NOTE — NC FL2 (Signed)
[place] LEVEL OF CARE SCREENING TOOL     IDENTIFICATION  Patient Name: Kerri Vasquez Birthdate: [birth year] Sex: female Admission Date (Current Location): 03/14/2016  [facility] and [place] Number:  Herbalist and Address:  [facility],  [place] [address], [place]      Provider Number: [id number]  Attending Physician Name and Address:  Hosie Poisson, MD  Relative Name and Phone Number:       Current Level of Care: Hospital Recommended Level of Care: [facility] Prior Approval Number:    Date Approved/Denied:   PASRR Number: [id number]  Discharge Plan: SNF    Current Diagnoses: Patient Active Problem List   Diagnosis Date Noted  . Sepsis ([place]) 03/15/2016  . Acute respiratory failure with hypoxia ([place]) 03/14/2016  . Tachycardia 03/14/2016  . Decubitus ulcer of sacral region, stage 3 ([place]) 03/14/2016  . CAP (community acquired pneumonia) 03/14/2016  . Pressure injury of skin 02/03/2016  . Rib fracture 02/02/2016  . Fall 02/02/2016  . Iron deficiency anemia due to chronic blood loss 07/29/2015  . 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 Elevated troponin 04/20/2015  . Chest discomfort 04/20/2015  . Vomiting 04/19/2015  . Intractable nausea and vomiting 04/19/2015  . Acute kidney injury superimposed on chronic kidney disease ([place]) 04/19/2015  . Primary malignant neoplasm of breast ([place]) 12/07/2014  . Takotsubo syndrome 09/04/2014  . Chronic diastolic CHF (congestive heart failure) ([place]) 09/04/2014  . Acute on chronic diastolic CHF (congestive heart failure) ([place]) 09/04/2014  . Apical ballooning syndrome 09/04/2014  . Dehydration   . Peripheral edema   . AKI (acute kidney injury) ([place]) 08/26/2014  . Chronic systolic heart failure ([place]) 08/13/2014  . DNR (do not resuscitate) discussion 07/15/2014  . Weakness generalized 07/15/2014  . Palliative care encounter 07/15/2014  . Chronic pain syndrome  07/15/2014  . Acute systolic heart failure ([place]) 07/14/2014  . Dyspnea   . Congestive dilated cardiomyopathy ([place])   . Shock circulatory ([place]) 07/13/2014  . Encephalopathy acute 07/13/2014  . Pleural effusion 07/11/2014  . DM2 (diabetes mellitus, type 2) ([place]) 07/11/2014  . Essential hypertension 07/11/2014  . Hypothyroidism 07/11/2014  . NSTEMI (non-ST elevated myocardial infarction) ([place]) 07/11/2014  . SOB (shortness of breath) 07/11/2014  . Acquired hypothyroidism 07/11/2014  . Chest pain   . Other specified hypotension   . Breast cancer metastasized to liver ([place])   . Fibrositis 08/06/2013  . Diabetes mellitus ([place]) 08/06/2013  . Fibromyalgia 08/06/2013  . Personal history of other diseases of the digestive system 08/06/2013  . Obstructive apnea 08/06/2013  . Lumbar canal stenosis 10/25/2012  . Degeneration of intervertebral disc of lumbosacral region 10/05/2012  . Rheumatoid arthritis ([place]) 10/05/2012  . 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 Pernicious anemia 05/30/2011  . Anemia 05/29/2011  . Sleep apnea 05/29/2011  . Degenerative joint disease 05/16/2011  . Arthritis, degenerative 05/16/2011  . Adenocarcinoma, breast ([place]) 03/07/2011  . Rheumatoid arthritis(714.0) 03/07/2011  . Depression 03/07/2011  . Migraine 03/07/2011  . GERD (gastroesophageal reflux disease) 03/07/2011  . Acid reflux 03/07/2011  . Spinal stenosis with chronic low back pain     Orientation RESPIRATION BLADDER Height & Weight     Self, Time, Situation, Place  O2 (2L) Incontinent, Indwelling catheter Weight: 277 lb 9 oz (125.9 kg) Height:  5' 6.5" (168.9 cm)  BEHAVIORAL SYMPTOMS/MOOD NEUROLOGICAL BOWEL NUTRITION STATUS      Continent Diet (Heart)  AMBULATORY STATUS COMMUNICATION OF NEEDS  Skin   Extensive Assist Verbally PU Stage and Appropriate Care, Hydro Therapy (Pressure Injury 03/14/16 Unstageable - Full thickness tissue loss in which the base of the ulcer is covered  by slough (yellow, tan, gray, green or brown) and/or eschar (tan, brown or black) in the wound bed. eschar ) (sacrum)  PressureInjury11/14/17StageII-Partialthicknesslossofdermispresentingasashallowopenulcerwithared,pinkwoundbedwithoutslough.leftbuttocksnearsacrum(left medial buttock)  PressureInjury11/14/17StageII-Partialthicknesslossofdermispresentingasashallowopenulcerwithared,pinkwoundbedwithoutslough. (left upper buttock)  PressureInjury10/04/17StageII-Partialthicknesslossofdermispresentingasashallowopenulcerwithared,pinkwoundbedwithoutslough.2x3 (left buttock)  PressureInjury11/15/17Unstageable-Fullthicknesstissuelossinwhichthebaseoftheulceriscoveredbyslough(yellow,tan,gray,greenorbrown)and/oreschar(tan,brownorblack)inthewoundbed. (right lateral ankle)  PressureInjury11/15/17Unstageable-Fullthicknesstissuelossinwhichthebaseoftheulceriscoveredbyslough(yellow,tan,gray,greenorbrown)and/oreschar(tan,brownorblack)inthewoundbed. (left lateral ankle)               Personal Care Assistance Level of Assistance  Bathing, Feeding, Dressing Bathing Assistance: Maximum assistance Feeding assistance: Limited assistance Dressing Assistance: Maximum assistance     Functional Limitations Info             SPECIAL CARE FACTORS FREQUENCY  PT (By licensed PT), OT (By licensed OT)     PT Frequency: 5 OT Frequency: 5            Contractures      Additional Factors Info  Code Status, Allergies Code Status Info: Fullcode Allergies Info: Influenza Vac Split Flu Virus Vaccine, Pneumococcal Vaccines, Ritalin Methylphenidate Hcl, Zostavax Zoster Vaccine Live (O[employer]-merck), Adhesive Tape           Current Medications (03/20/2016):  This is the current hospital active medication list Current Facility-Administered Medications  Medication Dose  Route Frequency Provider Last Rate Last Dose  . acetaminophen (TYLENOL) tablet 650 mg  650 mg Oral Q4H PRN Etta Quill, DO      . azithromycin [facility]) tablet 500 mg  500 mg Oral QHS Thomes Lolling, RPH   500 mg at 03/19/16 2237  . cefTRIAXone (ROCEPHIN) 2 g in dextrose 5 % 50 mL IVPB  2 g Intravenous Q24H Polly Cobia, RPH   2 g at 03/19/16 2239  . fentaNYL (DURAGESIC - dosed mcg/hr) patch 25 mcg  25 mcg Transdermal Q72H Hosie Poisson, MD   25 mcg at 03/18/16 1754  . fludrocortisone (FLORINEF) tablet 100 mcg  100 mcg Oral BID Reyne Dumas, MD   100 mcg at 03/19/16 2237  . FLUoxetine (PROZAC) capsule 40 mg  40 mg Oral Daily Etta Quill, DO   40 mg at 03/19/16 [id number]  . guaiFENesin (MUCINEX) 12 hr tablet 600 mg  600 mg Oral BID Florencia Reasons, MD   600 mg at 03/19/16 2238  . ibuprofen (ADVIL,MOTRIN) tablet 600 mg  600 mg Oral Q6H PRN Chesley Mires, MD   600 mg at 03/19/16 2013  . levothyroxine (SYNTHROID, LEVOTHROID) tablet 200 mcg  200 mcg Oral QAC breakfast Etta Quill, DO   200 mcg at 03/19/16 [id number]  . lipase/protease/amylase (CREON) capsule 24,000 Units  24,000 Units Oral TID AC Etta Quill, DO   24,000 Units at 03/19/16 1702  . LORazepam (ATIVAN) tablet 0.5 mg  0.5 mg Oral Q6H PRN Etta Quill, DO      . magnesium hydroxide (MILK OF MAGNESIA) suspension 30 mL  30 mL Oral Daily PRN Hosie Poisson, MD      . MEDLINE mouth rinse  15 mL Mouth Rinse BID Etta Quill, DO   15 mL at 03/19/16 1000  . methocarbamol (ROBAXIN) tablet 750 mg  750 mg Oral TID PRN Etta Quill, DO   750 mg at 03/20/16 0551  . multivitamin with minerals tablet 1 tablet  1 tablet Oral Daily Etta Quill, DO   1 tablet at 03/19/16 [id number]  . ondansetron (ZOFRAN) injection 4 mg  4  mg Intravenous Q6H PRN Etta Quill, DO      . ondansetron [facility]) tablet 8 mg  8 mg Oral Q8H PRN Etta Quill, DO      . oxyCODONE (Oxy IR/ROXICODONE) immediate release tablet 20 mg  20 mg Oral Q4H PRN Etta Quill, DO    20 mg at 03/20/16 0551  . pantoprazole (PROTONIX) EC tablet 40 mg  40 mg Oral BID Etta Quill, DO   40 mg at 03/19/16 2237  . polyethylene glycol (MIRALAX / GLYCOLAX) packet 17 g  17 g Oral Daily PRN Etta Quill, DO      . polyethylene glycol (MIRALAX / GLYCOLAX) packet 17 g  17 g Oral Daily Florencia Reasons, MD   17 g at 03/19/16 0927  . pregabalin (LYRICA) capsule 200 mg  200 mg Oral BID Etta Quill, DO   200 mg at 03/19/16 2238  . prochlorperazine (COMPAZINE) tablet 10 mg  10 mg Oral Q6H PRN Etta Quill, DO      . promethazine (PHENERGAN) tablet 25 mg  25 mg Oral Q8H PRN Etta Quill, DO      . senna-docusate (Senokot-S) tablet 1 tablet  1 tablet Oral BID Florencia Reasons, MD   1 tablet at 03/19/16 2237  . sodium chloride flush (NS) 0.9 % injection 10-40 mL  10-40 mL Intracatheter PRN Etta Quill, DO   3 mL at 03/19/16 0934  . Suvorexant TABS 20 mg  20 mg Oral QHS PRN Etta Quill, DO      . tiZANidine (ZANAFLEX) tablet 4 mg  4 mg Oral QHS Etta Quill, DO   4 mg at 03/19/16 2237  . vancomycin (VANCOCIN) 1,250 mg in sodium chloride 0.9 % 250 mL IVPB  1,250 mg Intravenous Q12H Polly Cobia, RPH   1,250 mg at 03/19/16 2239     Discharge Medications: Please see discharge summary for a list of discharge medications.  Relevant Imaging Results:  Relevant Lab Results:   Additional Information Contact Precautions for MRSA SSN: [id number]  Standley Brooking, LCSW

## 2016-03-20 NOTE — Clinical Social Work Note (Signed)
Clinical Social Work Assessment  Patient Details  Name: Kerri Vasquez MRN: [id number] Date of Birth: 11/17/1955  Date of referral:  03/20/16               Reason for consult:  Facility Placement                Permission sought to share information with:  Facility Contact Representative Permission granted to share information::  Yes, Verbal Permission Granted  Name::        Agency::     Relationship::     Contact Information:     Housing/Transportation Living arrangements for the past 2 months:  Single Family Home Source of Information:  Patient, Adult Children Patient Interpreter Needed:  None Criminal Activity/Legal Involvement Pertinent to Current Situation/Hospitalization:  No - Comment as needed Significant Relationships:  Adult Children Lives with:  Adult Children Do you feel safe going back to the place where you live?  No Need for family participation in patient care:  Yes (Comment)  Care giving concerns:  CSW received consult for SNF placement.    Social Worker assessment / plan:  CSW met with patient & daughter, Kerri Vasquez at bedside re: discharge planning.   Employment status:  Retired Insurance information:  Medicare PT Recommendations:  Skilled Nursing Facility Information / Referral to community resources:  Skilled Nursing Facility  Patient/Family's Response to care:  Patient & daughter are agreeable with plan for SNF, were requesting [facility] but they do not provide hydrotherapy. CSW called around while in the room to see which facilities provide hydrotherapy - [facility] & [facility]. Patient & daughter are agreeable with plan to go to [facility].   Patient/Family's Understanding of and Emotional Response to Diagnosis, Current Treatment, and Prognosis:  Patient's daughter states that she had been taking care of her wounds at home, but agrees that she needs to go to a facility to get extensive wound care before returning home.   Emotional  Assessment Appearance:  Appears stated age Attitude/Demeanor/Rapport:    Affect (typically observed):    Orientation:  Oriented to Self, Oriented to Place, Oriented to  Time, Oriented to Situation Alcohol / Substance use:    Psych involvement (Current and /or in the community):     Discharge Needs  Concerns to be addressed:    Readmission within the last 30 days:    Current discharge risk:    Barriers to Discharge:      ,  F, LCSW 03/20/2016, 11:26 AM  

## 2016-03-20 NOTE — Progress Notes (Signed)
[facility] Progress Note  3 Days Post-Op  Subjective: Pain improving. Tolerating PO. Having bowel function. Denies fever, chills, night sweats. States she is going to [facility] at discharge - possibly as early as today.  Per HydroPT, leaving the patients room, the wound is healing nicely with >75%granulation tissue.  Objective: Vital signs in last 24 hours: Temp:  [98 F (36.7 C)-98.6 F (37 C)] 98.1 F (36.7 C) (11/20 0535) Pulse Rate:  [84-113] 84 (11/20 0535) Resp:  [18-20] 18 (11/20 0535) BP: (86-102)/(47-66) 102/47 (11/20 0535) SpO2:  [96 %-98 %] 98 % (11/20 0535) Weight:  [277 lb 9 oz (125.9 kg)] 277 lb 9 oz (125.9 kg) (11/20 0620) Last BM Date: 03/14/16 (per pt)  Intake/Output from previous day: 11/19 0701 - 11/20 0700 In: 350 [P.O.:240] Out: 1100 [Urine:1100] Intake/Output this shift: No intake/output data recorded.  PE: Gen:  Alert, NAD, pleasant and cooperative Abd: Soft, obese, NT/ND, +BS Sacral ulcer: did not examine - Hydro PT just finished with the patient and rolled her back over on her back.   Lab Results:  No results for input(s): WBC, HGB, HCT, PLT in the last 72 hours. BMET  Recent Labs  03/18/16 0446 03/19/16 0500  NA 143 143  K 3.1* 3.6  CL 111 114*  CO2 26 26  GLUCOSE 105* 93  BUN 18 17  CREATININE 0.54 0.55  CALCIUM 8.2* 8.3*   PT/INR No results for input(s): LABPROT, INR in the last 72 hours. CMP     Component Value Date/Time   NA 143 03/19/2016 0500   NA 142 12/23/2015 1113   K 3.6 03/19/2016 0500   K 4.2 12/23/2015 1113   CL 114 (H) 03/19/2016 0500   CL 105 10/13/2015 1141   CO2 26 03/19/2016 0500   CO2 20 (L) 12/23/2015 1113   GLUCOSE 93 03/19/2016 0500   GLUCOSE 148 (H) 12/23/2015 1113   GLUCOSE 106 10/13/2015 1141   BUN 17 03/19/2016 0500   BUN 25.6 12/23/2015 1113   CREATININE 0.55 03/19/2016 0500   CREATININE 1.1 12/23/2015 1113   CALCIUM 8.3 (L) 03/19/2016 0500   CALCIUM 8.1 (L) 12/23/2015 1113    PROT 5.6 (L) 03/16/2016 0730   PROT 6.2 (L) 12/23/2015 1113   ALBUMIN 2.5 (L) 03/16/2016 0730   ALBUMIN 3.1 (L) 12/23/2015 1113   AST 22 03/16/2016 0730   AST 14 12/23/2015 1113   ALT 19 03/16/2016 0730   ALT 14 12/23/2015 1113   ALKPHOS 65 03/16/2016 0730   ALKPHOS 64 12/23/2015 1113   BILITOT 0.7 03/16/2016 0730   BILITOT 0.46 12/23/2015 1113   GFRNONAA >60 03/19/2016 0500   GFRNONAA 64 (L) 03/07/2011 0000   GFRAA >60 03/19/2016 0500   GFRAA 74 (L) 03/07/2011 0000   Lipase     Component Value Date/Time   LIPASE 25 11/07/2011 1656       Studies/Results: No results found.  Anti-infectives: Anti-infectives    Start     Dose/Rate Route Frequency Ordered Stop   03/19/16 2200  azithromycin (ZITHROMAX) tablet 500 mg     500 mg Oral Daily at bedtime 03/19/16 0907 03/21/16 2159   03/17/16 2200  cefTRIAXone (ROCEPHIN) 2 g in dextrose 5 % 50 mL IVPB     2 g 100 mL/hr over 30 Minutes Intravenous Every 24 hours 03/17/16 1423 03/21/16 2159   03/17/16 2200  vancomycin (VANCOCIN) 1,250 mg in sodium chloride 0.9 % 250 mL IVPB  Status:  Discontinued  1,250 mg 166.7 mL/hr over 90 Minutes Intravenous Every 12 hours 03/17/16 1440 03/20/16 1057   03/15/16 1200  vancomycin (VANCOCIN) IVPB 1000 mg/200 mL premix  Status:  Discontinued     1,000 mg 200 mL/hr over 60 Minutes Intravenous Every 12 hours 03/15/16 0331 03/17/16 1440   03/15/16 0330  vancomycin (VANCOCIN) 1,500 mg in sodium chloride 0.9 % 500 mL IVPB     1,500 mg 250 mL/hr over 120 Minutes Intravenous  Once 03/15/16 0324 03/15/16 0546   03/14/16 2200  cefTRIAXone (ROCEPHIN) 1 g in dextrose 5 % 50 mL IVPB  Status:  Discontinued     1 g 100 mL/hr over 30 Minutes Intravenous Every 24 hours 03/14/16 2131 03/17/16 1423   03/14/16 2200  azithromycin (ZITHROMAX) 500 mg in dextrose 5 % 250 mL IVPB  Status:  Discontinued     500 mg 250 mL/hr over 60 Minutes Intravenous Every 24 hours 03/14/16 2131 03/19/16 [id number]      Assessment/Plan Sacral decubitus ulcer, unstageable   S/p excisional debridement 03/17/16 Dr. Armandina Gemma   POD#3  Hydro PT, BID wet-to-dry dressing changes  Hydro PT to page me tomorrow to examine wound if patient has not been discharged to SNF  abx per culture and sensitivities: proteus mirabilis    LOS: 6 days    Jill Alexanders , [facility] 03/20/2016, 11:08 AM Pager: [phone] Consults: [phone] Mon-Fri 7:00 am-4:30 pm Sat-Sun 7:00 am-11:30 am

## 2016-03-20 NOTE — Consult Note (Signed)
Circle Nurse wound consult note Reason for Consult: Wound follow up. Seen today with PT (hydrotherapy) Wound type:pressure Pressure Ulcer POA: Yes/No Measurement:per PT today Wound bed: 25% necrotic, 75% red Drainage (amount, consistency, odor) serous Periwound:intact, dry Dressing procedure/placement/frequency: Twice daily cleanse and fill with saline moistened gauze roll (Kerlix). Follow up with surgery ([facility]) as directed. Therapeutic mattress with low air loss feature. [place] nursing team will not follow routinely, but will remain available to this patient, the nursing, surgical and medical teams.  Please re-consult if needed. Thanks, Maudie Flakes, MSN, RN, [place], Arther Abbott  Pager# [phone]

## 2016-03-20 NOTE — Discharge Summary (Signed)
Physician Discharge Summary  Kerri Vasquez [id number] DOB: 28-Oct-1955 DOA: 03/14/2016  PCP: [name], MD  Admit date: 03/14/2016 Discharge date: 03/20/2016  Admitted From: home  Disposition: SNF  Recommendations for Outpatient Follow-up:  Follow up with PCP in 1-2 weeks Please obtain BMP/CBC in one week Please follow up with oncology as recommended.  Please follow up with hydrotherapy.  Please follow up the results of the wound culture.  Please follow up with surgery as needed.   wound care recommendation: Twice daily cleanse and fill with saline moistened gauze roll (Kerlix). Follow up with surgery ([facility]) as directed. Therapeutic mattress with low air loss feature.   Discharge Condition:stable . CODE STATUS: full code.  Diet recommendation: Heart Healthy   Brief/Interim Summary: Kerri Vasquez is a 60 y.o. 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 CT chest showed possible pneumonia . Surgery consulted for debridement of the sacral decubitus and wound cultures sent showing proteus and morganella sps.   Discharge Diagnoses:  Principal Problem:   Acute respiratory failure with hypoxia ([place]) Active Problems:   Breast cancer metastasized to liver (HCC)   Chronic diastolic CHF (congestive heart failure) (HCC)   Tachycardia   Decubitus ulcer of sacral region, stage 3 (HCC)   CAP (community acquired pneumonia)   Sepsis ([place])    Acute hypoxic respiratory failure as tolerated: CTA no PE, troponin negative,  actue hypoxic respiratory failure Likely from sepsis/bilateral pna and diastolic chf exacerbation, sedation from opiods analgesia could also contribute per  EMS, hypoxic to 70% on RA, on admission.  Currently off oxygen with good sats on RA.  Completed 7 days of IV antibiotics for the pneumonia.   Hypotension:  Patient developed significant hypotension the first night of admission, sbp in the 60's and 70's, PCCM consulted and recommendations given.  Hypotension Likely from sepsis, but patient does has low bp during last hospitalization, she was put on florinef Treat sepsis, continue florinef, avoid sedation. Resolved.    Bilateral pna / sepsis with hypotension, leukocytosis, lactic acidosis, hypoxia on presentation: mrsa screening positive, blood culture no growth,  wound swab  Showing proteus and morganella sps.  Patient also has several unstageble decubitus ulcers, the one on sacrum with malodorous drainage, general surgery consulted and he underwent debridement on 11/17. Completed 7 days of IV antibiotics. She will be on ciprofloxacin to complete the course.    Acute on chronic diastolic chf exacerbation:  She was discharged on lasix 40mg  po qd a month ago, which was held on admission for hypotension and restarted it after blood pressures improved.    Hypothyroidism: patient in the past noncompliant with synthroid supplement,  continue synthroid,  tsh 2.2 ( this has much improved compare to a month ago ) She will need to have repeat tsh in 4weeks to ensure stability, it seems her tsh has fluctuated in the past  Chronic pain: currently on fentanyl ,  on oxycodone 20mg  q4hrs prn ( she was discharged on 15mg  q4hr prn a month ago),  on lyrica, zanaflex at bedtime,.    Constipation: with chronic pain meds, bedridden status and hypothyroidism, patient is prone to constipation, started sennakot and miralax and milk of magnesium.   Metastatic breast cancer, with liver mets  and possible mets to omentum, she has progressive weakness, has been bedridden for the last two months, she is off chemotherapy, oncology Dr Leticia Clas  and recommendations given.   Sacral decubitus ulcer presented on admission  Wound care consulted "Wound type:stage IV sacral wound, unstageable R lateral ankle and R lateral leg just proximal to ankle, stage I R lateral calf Pressure Ulcer POA: Yes Measurement:sacrum 6cm x 7.5cm x 6cm 60% black, 40% red granulating tissue, small amt drainage, malodorous (consistant with necrotic tissue) R lateral ankle 0.5cm x 0.5cm x 0.2cm unstageable 100% slough no odor, small amt drainage R lateral leg 0.5cm x 0.5cm x 0.1cm 100% slough, no drainage, or odor Right lateral lower leg stage I 7cm x 2cm  Left lateral ankle 1cm x 2cm unstageable, 100% slough no drainage or odor Wound bed:see above Drainage (amount, consistency, odor) see above Periwound:see above  Dressing procedure/placement/frequency:Pt is on a therapeutic mattress with low air loss feature and will require this support surface when transfers to floor. Staff provided with guidance to minimize layers of bed linens.surgical consult. hydrotherapy daily followed by 3 days of twice daily dressings with sodium hypochlorite (Dakins, quarter percent). Pressure redistribution heel boots have been in use at home and will be continued here. Nutritional consult ordered for increased requirements for tissue repair and regeneration."  General surgery consulted and she underwent debridement of the sacral ulcer. Continue hydrotherapy .   morbid obesity:  Body mass index is 44.58 kg/m.   FTT, bedridden , from home  Discharge Instructions  Discharge Instructions    Discharge instructions    Complete by:  As directed    PLEASE follow up with PCP in 1 to 2 weeks.  Please follow up with surgery as needed. Please continue with hydrotherapy.       Medication List    STOP taking these medications   dexamethasone 4 MG tablet Commonly known as:  DECADRON   diphenoxylate-atropine 2.5-0.025 MG tablet Commonly known as:  LOMOTIL   ibuprofen 600  MG tablet Commonly known as:  ADVIL,MOTRIN   tiZANidine 4 MG capsule Commonly known as:  ZANAFLEX Please resume the Tizanidine one week after you have completed ciprofloxacin.      TAKE these medications   ciprofloxacin 500 MG tablet Commonly known as:  CIPRO Take 1 tablet (500 mg total) by mouth 2 (two) times daily.   cyanocobalamin 1000 MCG/ML injection Commonly known as:  (VITAMIN B-12) INJECT 1 ML AS DIRECTED EVERY 21 DAYS   fentaNYL 25 MCG/HR patch Commonly known as:  DURAGESIC - dosed mcg/hr Place 1 patch (25 mcg total) onto the skin every 3 (three) days. What changed:  Another medication with the same name was removed. Continue taking this medication, and follow the directions you see here.   fludrocortisone 0.1 MG tablet Commonly known as:  FLORINEF Take 1 tablet (100 mcg total) by mouth 2 (two) times daily. What changed:  See the new instructions.   FLUoxetine 40 MG capsule Commonly known as:  PROZAC Take 40 mg by mouth daily before breakfast.   levothyroxine 200 MCG tablet Commonly known as:  SYNTHROID, LEVOTHROID Take 200 mcg by mouth daily with breakfast.   lidocaine-prilocaine cream Commonly known as:  EMLA Apply 1 application topically as needed for port access   LORazepam 0.5 MG tablet Commonly known as:  ATIVAN PLACE 1 TABLET UNDER TONGUE EVERY 6 HOURS AS NEEDED FOR NAUSEA AND VOMITING   methocarbamol 750 MG tablet Commonly known as:  ROBAXIN TAKE  1 TABLET BY MOUTH THREE TIMES DAILY AS NEEDED What changed:  See the new instructions.   MULTI-VITAMINS Tabs Take 1 tablet by mouth daily.   nitroGLYCERIN 0.4 MG SL tablet Commonly known as:  NITROSTAT Place 1 tablet (0.4 mg total) under the tongue every 5 (five) minutes as needed for chest pain.   ondansetron 8 MG tablet Commonly known as:  ZOFRAN TAKE 1 TABLET BY MOUTH EVERY 8 HOURS AS NEEDED FOR NAUSEA AND VOMITING.   Oxycodone HCl 20 MG Tabs Take 1 tablet (20 mg total) by mouth every 4 (four)  hours as needed. What changed:  reasons to take this   Pancrelipase (Lip-Prot-Amyl) 24000-76000 units Cpep Take 1 capsule (24,000 Units total) by mouth 3 (three) times daily before meals.   pantoprazole 40 MG tablet Commonly known as:  PROTONIX Take 1 tablet (40 mg total) by mouth 2 (two) times daily.   polyethylene glycol packet Commonly known as:  MIRALAX / GLYCOLAX Take 17 g by mouth 2 (two) times daily. What changed:  when to take this  reasons to take this   pregabalin 200 MG capsule Commonly known as:  LYRICA Take 1 capsule (200 mg total) by mouth 2 (two) times daily.   prochlorperazine 10 MG tablet Commonly known as:  COMPAZINE TAKE 1 TABLET BY MOUTH EVERY 6 HOURS AS NEEDED FOR NAUSEA AND VOMITING   promethazine 25 MG tablet Commonly known as:  PHENERGAN TAKE 1 TABLET BY MOUTH EVERY 8 HOURS AS NEEDED FOR NAUSEA OR VOMITING.   Suvorexant 20 MG Tabs Commonly known as:  BELSOMRA Take 20 mg by mouth at bedtime as needed. What changed:  when to take this      Follow-up Information    [name], MD. Schedule an appointment as soon as possible for a visit in 1 week(s).   Specialty:  Family Medicine Contact information: [place] [address] [phone]          Allergies  Allergen Reactions  . Influenza Vac Split [Flu Virus Vaccine] Other (See Comments)    Joint stiffness and renal failure  . Pneumococcal Vaccines Other (See Comments)    "sepsis and renal failure"  . Ritalin [Methylphenidate Hcl]     "Heart attack"  . Zostavax [Zoster Vaccine Live ([employer])] Other (See Comments)    Joint stiffness, renal failure  . Adhesive [Tape] Itching and Rash    Consultations:  Surgery.   Procedures/Studies: Ct Angio Chest Pe W/cm &/or Wo Cm  Addendum Date: 03/14/2016   ADDENDUM REPORT: 03/14/2016 21:20 ADDENDUM: There is a mildly displaced eighth rib fracture on the right laterally.  Electronically Signed   By: Lowella Grip III M.D.   On: 03/14/2016 21:20   Result Date: 03/14/2016 CLINICAL DATA:  Shortness of breath. Breast carcinoma. Tachycardia. EXAM: CT ANGIOGRAPHY CHEST WITH CONTRAST TECHNIQUE: Multidetector CT imaging of the chest was performed using the standard protocol during bolus administration of intravenous contrast. Multiplanar CT image reconstructions and MIPs were obtained to evaluate the vascular anatomy. CONTRAST:  100 mL Isovue 370 nonionic COMPARISON:  Chest CT February 02, 2016; chest radiograph March 14, 2016 FINDINGS: Cardiovascular: There is no demonstrable pulmonary embolus. Ascending thoracic aorta measures 4.0 x 4.0 in transverse diameter; this measurement was obtained on axial slice 38 series 4. There is no demonstrable thoracic aortic dissection. The visualized great vessels appear normal. Note that the right and left common carotid arteries arise as a common trunk, an anatomic variant.  There is no appreciable pericardial effusion. Port-A-Cath tip is at the cavoatrial junction. Mediastinum/Nodes: Surgical clips are noted in the right thyroid region consistent with hemi thyroidectomy. No thyroid lesion present currently. Left lobe thyroid diminutive. There is no appreciable thoracic adenopathy. Lungs/Pleura: There is a focal opacity in the anterior right apex measuring 1.2 x 1.1 cm. Patchy opacity is noted in the right upper lobe with several ill-defined opacities in the right upper lobe, possibly representing small nodular lesions. There is airspace consolidation in a portion of the anterior segment of the left upper lobe. There is ill-defined patchy airspace opacity in the right lower lobe at multiple sites. There is atelectatic change in the left base. There is a small left pleural effusion. Upper Abdomen: In the visualized upper abdomen, liver metastases are again noted without change. The largest of these metastases arises in the anterior segment right  lobe measuring 5.2 x 3.0 cm. Visualized upper abdominal structures otherwise appear unremarkable. Musculoskeletal: There is a breast implant on the right. There is degenerative change in the thoracic spine. No blastic or lytic bone lesions are evident. There is evidence of a prior fracture of the posterior right tenth rib. There is a mildly displaced fracture of the lateral right eighth rib. There are no blastic or lytic bone lesions. Review of the MIP images confirms the above findings. IMPRESSION: No demonstrable pulmonary embolus. Ascending thoracic aorta measures 4.0 x 4.0 cm in diameter. Recommend annual imaging followup by CTA or MRA. This recommendation follows 2010 ACCF/AHA/AATS/ACR/ASA/SCA/SCAI/SIR/STS/SVM Guidelines for the Diagnosis and Management of Patients with Thoracic Aortic Disease. Circulation. 2010; 121SP:1689793. No dissection evident. Patchy areas of apparent pneumonia bilaterally. Nodular appearing opacity in the right apex measuring 1.2 x 1.1 cm raises concern for metastatic focus. There may be small metastases in the right upper lobe intermingled with patchy pneumonia. There is a rather minimal pericardial effusion. No appreciable thoracic adenopathy. Multiple liver metastases. Breast implant on the right. Electronically Signed: By: Lowella Grip III M.D. On: 03/14/2016 21:11   Dg Chest Port 1 View  Result Date: 03/14/2016 CLINICAL DATA:  Shortness of breath today, history breast cancer, CHF, diabetes mellitus, hypertension EXAM: PORTABLE CHEST 1 VIEW COMPARISON:  Portable exam 1638 hours compared 02/02/2016 FINDINGS: RIGHT jugular Port-A-Cath with tip projecting over cavoatrial junction. Enlargement of cardiac silhouette. Mediastinal contours and pulmonary vascularity grossly normal for technique. Peribronchial thickening without infiltrate, pleural effusion or pneumothorax. Diffuse osseous demineralization. IMPRESSION: Enlargement of cardiac silhouette. Mild bronchitic changes  without infiltrate. Electronically Signed   By: Lavonia Dana M.D.   On: 03/14/2016 17:05    Incision and Debridement of the sacral wound.    Subjective:  no new complaints.   Discharge Exam: Vitals:   03/20/16 0535 03/20/16 1304  BP: (!) 102/47 (!) 90/57  Pulse: 84 97  Resp: 18 18  Temp: 98.1 F (36.7 C) 98.1 F (36.7 C)   Vitals:   03/19/16 2000 03/20/16 0535 03/20/16 0620 03/20/16 1304  BP: (!) 94/56 (!) 102/47  (!) 90/57  Pulse: (!) 113 84  97  Resp: 18 18  18   Temp: 98 F (36.7 C) 98.1 F (36.7 C)  98.1 F (36.7 C)  TempSrc: Oral   Oral  SpO2: 96% 98%  100%  Weight:   125.9 kg (277 lb 9 oz)   Height:        General: Pt is alert, awake, not in acute distress Cardiovascular: RRR, S1/S2 +, no rubs, no gallops Respiratory: CTA bilaterally, no wheezing,  no rhonchi Abdominal: Soft, NT, ND, bowel sounds +     The results of significant diagnostics from this hospitalization (including imaging, microbiology, ancillary and laboratory) are listed below for reference.     Microbiology: Recent Results (from the past 240 hour(s))  MRSA PCR Screening     Status: Abnormal   Collection Time: 03/14/16  9:30 PM  Result Value Ref Range Status   MRSA by PCR POSITIVE (A) NEGATIVE Final    Comment:        The GeneXpert MRSA Assay (FDA approved for NASAL specimens only), is one component of a comprehensive MRSA colonization surveillance program. It is not intended to diagnose MRSA infection nor to guide or monitor treatment for MRSA infections. RESULT CALLED TO, READ BACK BY AND VERIFIED WITH: [facility] RN [phone] [place],N   Culture, blood (routine x 2) Call MD if unable to obtain prior to antibiotics being given     Status: None   Collection Time: 03/15/16 12:40 AM  Result Value Ref Range Status   Specimen Description BLOOD PORTA CATH  Final   Special Requests BOTTLES DRAWN AEROBIC AND ANAEROBIC 10 CC  Final   Culture   Final    NO GROWTH 5 DAYS Performed at  [facility]    Report Status 03/20/2016 FINAL  Final  Aerobic Culture (superficial specimen)     Status: None   Collection Time: 03/16/16  4:00 PM  Result Value Ref Range Status   Specimen Description SACRAL  Final   Special Requests NONE  Final   Gram Stain   Final    RARE WBC PRESENT, PREDOMINANTLY PMN FEW GRAM VARIABLE ROD RARE GRAM POSITIVE COCCI IN PAIRS Performed at [place] MORGANII   Final   Report Status 03/20/2016 FINAL  Final   Organism ID, Bacteria PROTEUS MIRABILIS  Final   Organism ID, Bacteria MORGANELLA MORGANII  Final      Susceptibility   Morganella morganii - MIC*    AMPICILLIN 16 RESISTANT Resistant     CEFAZOLIN >=64 RESISTANT Resistant     CEFEPIME <=1 SENSITIVE Sensitive     CEFTAZIDIME <=1 SENSITIVE Sensitive     CEFTRIAXONE <=1 SENSITIVE Sensitive     CIPROFLOXACIN <=0.25 SENSITIVE Sensitive     GENTAMICIN <=1 SENSITIVE Sensitive     IMIPENEM 4 SENSITIVE Sensitive     TRIMETH/SULFA <=20 SENSITIVE Sensitive     AMPICILLIN/SULBACTAM >=32 RESISTANT Resistant     PIP/TAZO <=4 SENSITIVE Sensitive     * FEW MORGANELLA MORGANII   Proteus mirabilis - MIC*    AMPICILLIN <=2 SENSITIVE Sensitive     CEFAZOLIN <=4 SENSITIVE Sensitive     CEFEPIME <=1 SENSITIVE Sensitive     CEFTAZIDIME <=1 SENSITIVE Sensitive     CEFTRIAXONE <=1 SENSITIVE Sensitive     CIPROFLOXACIN <=0.25 SENSITIVE Sensitive     GENTAMICIN <=1 SENSITIVE Sensitive     IMIPENEM 2 SENSITIVE Sensitive     TRIMETH/SULFA <=20 SENSITIVE Sensitive     AMPICILLIN/SULBACTAM <=2 SENSITIVE Sensitive     PIP/TAZO <=4 SENSITIVE Sensitive     * FEW PROTEUS MIRABILIS  Aerobic/Anaerobic Culture (surgical/deep wound)     Status: None (Preliminary result)   Collection Time: 03/17/16 12:57 PM  Result Value Ref Range Status   Specimen Description WOUND SACRAL  Final   Special Requests NONE  Final   Gram Stain   Final    RARE WBC  PRESENT,BOTH PMN AND MONONUCLEAR  FEW GRAM POSITIVE RODS MODERATE GRAM NEGATIVE RODS FEW GRAM POSITIVE COCCI IN PAIRS    Culture   Final    FEW PROTEUS MIRABILIS RARE STAPHYLOCOCCUS AUREUS SUSCEPTIBILITIES TO FOLLOW Performed at [facility]    Report Status PENDING  Incomplete   Organism ID, Bacteria PROTEUS MIRABILIS  Final      Susceptibility   Proteus mirabilis - MIC*    AMPICILLIN <=2 SENSITIVE Sensitive     CEFAZOLIN <=4 SENSITIVE Sensitive     CEFEPIME <=1 SENSITIVE Sensitive     CEFTAZIDIME <=1 SENSITIVE Sensitive     CEFTRIAXONE <=1 SENSITIVE Sensitive     CIPROFLOXACIN <=0.25 SENSITIVE Sensitive     GENTAMICIN <=1 SENSITIVE Sensitive     IMIPENEM 2 SENSITIVE Sensitive     TRIMETH/SULFA <=20 SENSITIVE Sensitive     AMPICILLIN/SULBACTAM <=2 SENSITIVE Sensitive     PIP/TAZO <=4 SENSITIVE Sensitive     * FEW PROTEUS MIRABILIS     Labs: BNP (last 3 results)  Recent Labs  03/14/16 1758  BNP [id number]*   Basic Metabolic Panel:  Recent Labs Lab 03/15/16 0422 03/16/16 0449 03/17/16 0455 03/18/16 0446 03/19/16 0500  NA 145 144 142 143 143  K 3.2* 3.2* 3.9 3.1* 3.6  CL 114* 117* 112* 111 114*  CO2 24 22 24 26 26   GLUCOSE 185* 97 92 105* 93  BUN 34* 26* 19 18 17   CREATININE 0.80 0.64 0.70 0.54 0.55  CALCIUM 7.8* 7.6* 8.3* 8.2* 8.3*  MG  --  1.7 1.6*  --   --    Liver Function Tests:  Recent Labs Lab 03/14/16 1758 03/16/16 0730  AST 23 22  ALT 22 19  ALKPHOS 80 65  BILITOT 1.2 0.7  PROT 6.7 5.6*  ALBUMIN 3.1* 2.5*   No results for input(s): LIPASE, AMYLASE in the last 168 hours. No results for input(s): AMMONIA in the last 168 hours. CBC:  Recent Labs Lab 03/14/16 1758 03/16/16 0449 03/17/16 0455  WBC 13.4* 11.2* 10.3  NEUTROABS 11.9* 9.5*  --   HGB 12.5 9.2* 10.3*  HCT 38.1 28.3* 32.5*  MCV 100.0 101.8* 103.2*  PLT 149* 121* 122*   Cardiac Enzymes:  Recent Labs Lab 03/15/16 0422 03/15/16 0942 03/15/16 1530  TROPONINI <0.03  <0.03 <0.03   BNP: Invalid input(s): POCBNP CBG:  Recent Labs Lab 03/17/16 1337  GLUCAP 105*   D-Dimer No results for input(s): DDIMER in the last 72 hours. Hgb A1c No results for input(s): HGBA1C in the last 72 hours. Lipid Profile No results for input(s): CHOL, HDL, LDLCALC, TRIG, CHOLHDL, LDLDIRECT in the last 72 hours. Thyroid function studies No results for input(s): TSH, T4TOTAL, T3FREE, THYROIDAB in the last 72 hours.  Invalid input(s): FREET3 Anemia work up No results for input(s): VITAMINB12, FOLATE, FERRITIN, TIBC, IRON, RETICCTPCT in the last 72 hours. Urinalysis    Component Value Date/Time   COLORURINE AMBER (A) 02/06/2016 1953   APPEARANCEUR CLEAR 02/06/2016 1953   LABSPEC 1.028 02/06/2016 1953   PHURINE 6.0 02/06/2016 1953   GLUCOSEU NEGATIVE 02/06/2016 1953   HGBUR NEGATIVE 02/06/2016 1953   BILIRUBINUR SMALL (A) 02/06/2016 1953   BILIRUBINUR negative 05/24/2011 1630   KETONESUR NEGATIVE 02/06/2016 1953   PROTEINUR NEGATIVE 02/06/2016 1953   UROBILINOGEN 0.2 08/26/2014 1850   NITRITE NEGATIVE 02/06/2016 1953   LEUKOCYTESUR SMALL (A) 02/06/2016 1953   Sepsis Labs Invalid input(s): PROCALCITONIN,  WBC,  LACTICIDVEN Microbiology Recent Results (from the past 240 hour(s))  MRSA PCR Screening  Status: Abnormal   Collection Time: 03/14/16  9:30 PM  Result Value Ref Range Status   MRSA by PCR POSITIVE (A) NEGATIVE Final    Comment:        The GeneXpert MRSA Assay (FDA approved for NASAL specimens only), is one component of a comprehensive MRSA colonization surveillance program. It is not intended to diagnose MRSA infection nor to guide or monitor treatment for MRSA infections. RESULT CALLED TO, READ BACK BY AND VERIFIED WITH: [facility] RN [phone] [place],N   Culture, blood (routine x 2) Call MD if unable to obtain prior to antibiotics being given     Status: None   Collection Time: 03/15/16 12:40 AM  Result Value Ref Range Status    Specimen Description BLOOD PORTA CATH  Final   Special Requests BOTTLES DRAWN AEROBIC AND ANAEROBIC 10 CC  Final   Culture   Final    NO GROWTH 5 DAYS Performed at [facility]    Report Status 03/20/2016 FINAL  Final  Aerobic Culture (superficial specimen)     Status: None   Collection Time: 03/16/16  4:00 PM  Result Value Ref Range Status   Specimen Description SACRAL  Final   Special Requests NONE  Final   Gram Stain   Final    RARE WBC PRESENT, PREDOMINANTLY PMN FEW GRAM VARIABLE ROD RARE GRAM POSITIVE COCCI IN PAIRS Performed at [place] [facility]   Final   Report Status 03/20/2016 FINAL  Final   Organism ID, Bacteria PROTEUS MIRABILIS  Final   Organism ID, Bacteria MORGANELLA MORGANII  Final      Susceptibility   Morganella morganii - MIC*    AMPICILLIN 16 RESISTANT Resistant     CEFAZOLIN >=64 RESISTANT Resistant     CEFEPIME <=1 SENSITIVE Sensitive     CEFTAZIDIME <=1 SENSITIVE Sensitive     CEFTRIAXONE <=1 SENSITIVE Sensitive     CIPROFLOXACIN <=0.25 SENSITIVE Sensitive     GENTAMICIN <=1 SENSITIVE Sensitive     IMIPENEM 4 SENSITIVE Sensitive     TRIMETH/SULFA <=20 SENSITIVE Sensitive     AMPICILLIN/SULBACTAM >=32 RESISTANT Resistant     PIP/TAZO <=4 SENSITIVE Sensitive     * FEW MORGANELLA MORGANII   Proteus mirabilis - MIC*    AMPICILLIN <=2 SENSITIVE Sensitive     CEFAZOLIN <=4 SENSITIVE Sensitive     CEFEPIME <=1 SENSITIVE Sensitive     CEFTAZIDIME <=1 SENSITIVE Sensitive     CEFTRIAXONE <=1 SENSITIVE Sensitive     CIPROFLOXACIN <=0.25 SENSITIVE Sensitive     GENTAMICIN <=1 SENSITIVE Sensitive     IMIPENEM 2 SENSITIVE Sensitive     TRIMETH/SULFA <=20 SENSITIVE Sensitive     AMPICILLIN/SULBACTAM <=2 SENSITIVE Sensitive     PIP/TAZO <=4 SENSITIVE Sensitive     * FEW PROTEUS MIRABILIS  Aerobic/Anaerobic Culture (surgical/deep wound)     Status: None (Preliminary result)   Collection Time:  03/17/16 12:57 PM  Result Value Ref Range Status   Specimen Description WOUND SACRAL  Final   Special Requests NONE  Final   Gram Stain   Final    RARE WBC PRESENT,BOTH PMN AND MONONUCLEAR FEW GRAM POSITIVE RODS MODERATE GRAM NEGATIVE RODS FEW GRAM POSITIVE COCCI IN PAIRS    Culture   Final    FEW PROTEUS MIRABILIS RARE STAPHYLOCOCCUS AUREUS SUSCEPTIBILITIES TO FOLLOW Performed at [facility]    Report Status PENDING  Incomplete   Organism ID, Bacteria PROTEUS  MIRABILIS  Final      Susceptibility   Proteus mirabilis - MIC*    AMPICILLIN <=2 SENSITIVE Sensitive     CEFAZOLIN <=4 SENSITIVE Sensitive     CEFEPIME <=1 SENSITIVE Sensitive     CEFTAZIDIME <=1 SENSITIVE Sensitive     CEFTRIAXONE <=1 SENSITIVE Sensitive     CIPROFLOXACIN <=0.25 SENSITIVE Sensitive     GENTAMICIN <=1 SENSITIVE Sensitive     IMIPENEM 2 SENSITIVE Sensitive     TRIMETH/SULFA <=20 SENSITIVE Sensitive     AMPICILLIN/SULBACTAM <=2 SENSITIVE Sensitive     PIP/TAZO <=4 SENSITIVE Sensitive     * FEW PROTEUS MIRABILIS     Time coordinating discharge: Over 30 minutes  SIGNED:   Hosie Poisson, MD  [facility] 03/20/2016, 3:52 PM Pager   If 7PM-7AM, please contact night-coverage [email or web address] Password [email or web address]

## 2016-03-20 NOTE — Clinical Social Work Placement (Signed)
Patient is set to discharge to [name] SNF today. Patient & daughter, [name] at bedside made aware. Discharge packet given to RN, Gregary Signs. [facility] called for transport.     Raynaldo Opitz, [facility] Clinical Social Worker cell #: [phone]    CLINICAL SOCIAL WORK PLACEMENT  NOTE  Date:  03/20/2016  Patient Details  Name: Kerri Vasquez MRN: [id number] Date of Birth: 03-28-1956  Clinical Social Work is seeking post-discharge placement for this patient at the [place] level of care (*CSW will initial, date and re-position this form in  chart as items are completed):  Yes   Patient/family provided with [place] Work Department's list of facilities offering this level of care within the geographic area requested by the patient (or if unable, by the patient's family).  Yes   Patient/family informed of their freedom to choose among providers that offer the needed level of care, that participate in Medicare, Medicaid or managed care program needed by the patient, have an available bed and are willing to accept the patient.  Yes   Patient/family informed of [place]'s ownership interest in [facility] and [facility], as well as of the fact that they are under no obligation to receive care at these facilities.  PASRR submitted to EDS on 03/20/16     PASRR number received on 03/20/16     Existing PASRR number confirmed on       FL2 transmitted to all facilities in geographic area requested by pt/family on 03/20/16     FL2 transmitted to all facilities within larger geographic area on       Patient informed that his/her managed care company has contracts with or will negotiate with certain facilities, including the following:        Yes   Patient/family informed of bed offers received.  Patient chooses bed at [place]     Physician recommends and patient chooses bed at       Patient to be transferred to [facility] on 03/20/16.  Patient to be transferred to facility by [facility]     Patient family notified on 03/20/16 of transfer.  Name of family member notified:  patient's daughter, [name] at bedside     PHYSICIAN       Additional Comment:    _______________________________________________ Standley Brooking, LCSW 03/20/2016, 3:21 PM

## 2016-03-21 ENCOUNTER — Encounter: Payer: Self-pay | Admitting: Adult Health

## 2016-03-21 ENCOUNTER — Non-Acute Institutional Stay (SKILLED_NURSING_FACILITY): Payer: Medicare Other | Admitting: Adult Health

## 2016-03-21 DIAGNOSIS — N390 Urinary tract infection, site not specified: Secondary | ICD-10-CM | POA: Diagnosis not present

## 2016-03-21 DIAGNOSIS — G4700 Insomnia, unspecified: Secondary | ICD-10-CM | POA: Diagnosis not present

## 2016-03-21 DIAGNOSIS — F419 Anxiety disorder, unspecified: Secondary | ICD-10-CM | POA: Diagnosis not present

## 2016-03-21 DIAGNOSIS — R05 Cough: Secondary | ICD-10-CM | POA: Diagnosis not present

## 2016-03-21 DIAGNOSIS — Z7409 Other reduced mobility: Secondary | ICD-10-CM | POA: Diagnosis not present

## 2016-03-21 DIAGNOSIS — I5032 Chronic diastolic (congestive) heart failure: Secondary | ICD-10-CM | POA: Diagnosis not present

## 2016-03-21 DIAGNOSIS — R531 Weakness: Secondary | ICD-10-CM | POA: Diagnosis not present

## 2016-03-21 DIAGNOSIS — C50919 Malignant neoplasm of unspecified site of unspecified female breast: Secondary | ICD-10-CM | POA: Diagnosis not present

## 2016-03-21 DIAGNOSIS — L8992 Pressure ulcer of unspecified site, stage 2: Secondary | ICD-10-CM | POA: Diagnosis not present

## 2016-03-21 DIAGNOSIS — L89150 Pressure ulcer of sacral region, unstageable: Secondary | ICD-10-CM | POA: Diagnosis not present

## 2016-03-21 DIAGNOSIS — Z9289 Personal history of other medical treatment: Secondary | ICD-10-CM | POA: Diagnosis not present

## 2016-03-21 DIAGNOSIS — R279 Unspecified lack of coordination: Secondary | ICD-10-CM | POA: Diagnosis not present

## 2016-03-21 DIAGNOSIS — I5031 Acute diastolic (congestive) heart failure: Secondary | ICD-10-CM | POA: Diagnosis not present

## 2016-03-21 DIAGNOSIS — L89523 Pressure ulcer of left ankle, stage 3: Secondary | ICD-10-CM | POA: Diagnosis not present

## 2016-03-21 DIAGNOSIS — L89512 Pressure ulcer of right ankle, stage 2: Secondary | ICD-10-CM | POA: Diagnosis not present

## 2016-03-21 DIAGNOSIS — E039 Hypothyroidism, unspecified: Secondary | ICD-10-CM | POA: Diagnosis not present

## 2016-03-21 DIAGNOSIS — Z9013 Acquired absence of bilateral breasts and nipples: Secondary | ICD-10-CM | POA: Diagnosis not present

## 2016-03-21 DIAGNOSIS — E46 Unspecified protein-calorie malnutrition: Secondary | ICD-10-CM | POA: Diagnosis not present

## 2016-03-21 DIAGNOSIS — D649 Anemia, unspecified: Secondary | ICD-10-CM | POA: Diagnosis not present

## 2016-03-21 DIAGNOSIS — F321 Major depressive disorder, single episode, moderate: Secondary | ICD-10-CM | POA: Diagnosis not present

## 2016-03-21 DIAGNOSIS — F339 Major depressive disorder, recurrent, unspecified: Secondary | ICD-10-CM | POA: Diagnosis not present

## 2016-03-21 DIAGNOSIS — L928 Other granulomatous disorders of the skin and subcutaneous tissue: Secondary | ICD-10-CM | POA: Diagnosis not present

## 2016-03-21 DIAGNOSIS — I509 Heart failure, unspecified: Secondary | ICD-10-CM | POA: Diagnosis not present

## 2016-03-21 DIAGNOSIS — L89153 Pressure ulcer of sacral region, stage 3: Secondary | ICD-10-CM | POA: Diagnosis not present

## 2016-03-21 DIAGNOSIS — G8929 Other chronic pain: Secondary | ICD-10-CM | POA: Diagnosis not present

## 2016-03-21 DIAGNOSIS — R600 Localized edema: Secondary | ICD-10-CM | POA: Diagnosis not present

## 2016-03-21 DIAGNOSIS — C787 Secondary malignant neoplasm of liver and intrahepatic bile duct: Secondary | ICD-10-CM | POA: Diagnosis not present

## 2016-03-21 DIAGNOSIS — L89522 Pressure ulcer of left ankle, stage 2: Secondary | ICD-10-CM | POA: Diagnosis not present

## 2016-03-21 DIAGNOSIS — R41841 Cognitive communication deficit: Secondary | ICD-10-CM | POA: Diagnosis not present

## 2016-03-21 DIAGNOSIS — L89513 Pressure ulcer of right ankle, stage 3: Secondary | ICD-10-CM | POA: Diagnosis not present

## 2016-03-21 DIAGNOSIS — J9601 Acute respiratory failure with hypoxia: Secondary | ICD-10-CM | POA: Diagnosis not present

## 2016-03-21 DIAGNOSIS — L89510 Pressure ulcer of right ankle, unstageable: Secondary | ICD-10-CM | POA: Diagnosis not present

## 2016-03-21 DIAGNOSIS — J189 Pneumonia, unspecified organism: Secondary | ICD-10-CM | POA: Diagnosis not present

## 2016-03-21 DIAGNOSIS — I10 Essential (primary) hypertension: Secondary | ICD-10-CM | POA: Diagnosis not present

## 2016-03-21 DIAGNOSIS — R293 Abnormal posture: Secondary | ICD-10-CM | POA: Diagnosis not present

## 2016-03-21 DIAGNOSIS — R6889 Other general symptoms and signs: Secondary | ICD-10-CM | POA: Diagnosis not present

## 2016-03-21 DIAGNOSIS — J9690 Respiratory failure, unspecified, unspecified whether with hypoxia or hypercapnia: Secondary | ICD-10-CM | POA: Diagnosis not present

## 2016-03-21 DIAGNOSIS — L89154 Pressure ulcer of sacral region, stage 4: Secondary | ICD-10-CM | POA: Diagnosis present

## 2016-03-21 DIAGNOSIS — G629 Polyneuropathy, unspecified: Secondary | ICD-10-CM | POA: Diagnosis not present

## 2016-03-21 DIAGNOSIS — S91309A Unspecified open wound, unspecified foot, initial encounter: Secondary | ICD-10-CM | POA: Diagnosis not present

## 2016-03-21 DIAGNOSIS — I5021 Acute systolic (congestive) heart failure: Secondary | ICD-10-CM | POA: Diagnosis not present

## 2016-03-21 DIAGNOSIS — R32 Unspecified urinary incontinence: Secondary | ICD-10-CM | POA: Diagnosis not present

## 2016-03-21 DIAGNOSIS — I890 Lymphedema, not elsewhere classified: Secondary | ICD-10-CM | POA: Diagnosis not present

## 2016-03-21 DIAGNOSIS — Z853 Personal history of malignant neoplasm of breast: Secondary | ICD-10-CM | POA: Diagnosis not present

## 2016-03-21 DIAGNOSIS — L89520 Pressure ulcer of left ankle, unstageable: Secondary | ICD-10-CM | POA: Diagnosis not present

## 2016-03-21 DIAGNOSIS — J9621 Acute and chronic respiratory failure with hypoxia: Secondary | ICD-10-CM | POA: Diagnosis not present

## 2016-03-21 DIAGNOSIS — R2689 Other abnormalities of gait and mobility: Secondary | ICD-10-CM | POA: Diagnosis not present

## 2016-03-21 DIAGNOSIS — L97919 Non-pressure chronic ulcer of unspecified part of right lower leg with unspecified severity: Secondary | ICD-10-CM | POA: Diagnosis not present

## 2016-03-21 DIAGNOSIS — A419 Sepsis, unspecified organism: Secondary | ICD-10-CM | POA: Diagnosis not present

## 2016-03-21 DIAGNOSIS — K210 Gastro-esophageal reflux disease with esophagitis: Secondary | ICD-10-CM | POA: Diagnosis not present

## 2016-03-21 DIAGNOSIS — M6281 Muscle weakness (generalized): Secondary | ICD-10-CM | POA: Diagnosis not present

## 2016-03-21 NOTE — Progress Notes (Signed)
Patient ID: Kerri Vasquez, female   DOB: 09-19-1955, 60 y.o.   MRN: [id number]   Location:   [place] Room Number: [id number] Place of Service:  SNF (31)   CODE STATUS: Full Code until otherwise determined  Allergies  Allergen Reactions  . Influenza Vac Split [Flu Virus Vaccine] Other (See Comments)    Joint stiffness and renal failure  . Pneumococcal Vaccines Other (See Comments)    "sepsis and renal failure"  . Ritalin [Methylphenidate Hcl]     "Heart attack"  . Zostavax [Zoster Vaccine Live ([employer])] Other (See Comments)    Joint stiffness, renal failure  . Adhesive [Tape] Itching and Rash   Chief Complaint  Patient presents with  . Discharge Note     HPI:  She is being discharged to another SNF per family request. She will not need any home health or dme. I have written for her narcotics. She will follow up medically with the providers at the receiving facility.   She had been hospitalized for acute respiratory failure.she has stage IV breast cancer.   Past Medical History:  Diagnosis Date  . Anemia   . Breast cancer, right breast ([place]) dx'd 2008   right  . CHF (congestive heart failure) ([place])   . Chronic lower back pain   . Depression   . Diabetes mellitus    type II, controlled by diet 05/25/11   . Fibromyalgia   . Fracture of multiple toes    right toes x 4 toes- excluding small toe  . GERD (gastroesophageal reflux disease)   . Hypertension   . Hypothyroidism   . Incisional hernia   . Intestinal obstruction   . Iron deficiency anemia due to chronic blood loss 07/29/2015  . 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 Migraine    "weekly; but they don't last long" (08/26/2014)  . Myocardial infarction 06/2014   S/P percutaneous intervention procedure a metastatic lesion in the liver  . Nausea and vomiting 04/2015  . Osteoarthritis of multiple joints   . Pernicious anemia 05/30/2011   Iron transfusion and  VB12 on 06/06/11  . Renal insufficiency    hx renal failure 2011  . Sciatica   . Scoliosis   . Septic arthritis ([place]) 01/28/10   and spinal stenosis , moderate scoliosis   . Sleep apnea    hx of off machine x 4 years   . Spinal stenosis   . Thyroid cancer ([place]) 2013    Past Surgical History:  Procedure Laterality Date  . BREAST BIOPSY Right 2008  . BREAST IMPLANT REMOVAL Left 2015  . BREAST RECONSTRUCTION Bilateral 2008; 2009   "w/the mastectomy; "  . BREAST RECONSTRUCTION Left ~ 2010   "it capsized"  . CARDIAC CATHETERIZATION    . CHOLECYSTECTOMY OPEN  06/29/85  . COLECTOMY  2011   "SBO"  . GASTRIC BYPASS  2006   "w/scope"  . IRRIGATION AND DEBRIDEMENT ABSCESS N/A 03/17/2016   Procedure: IRRIGATION AND DEBRIDIMENT SACRAL DECUBITUS ULCER;  Surgeon: Armandina Gemma, MD;  Location: [facility] ORS;  Service: General;  Laterality: N/A;  . LEFT HEART CATHETERIZATION WITH CORONARY ANGIOGRAM N/A 07/14/2014   Procedure: LEFT HEART CATHETERIZATION WITH CORONARY ANGIOGRAM;  Surgeon: Leonie Man, MD;  Location: [facility] CATH LAB;  Service: Cardiovascular;  Laterality: N/A;  . LIVER BIOPSY  04/2015  . MASTECTOMY Bilateral 2008  . MULTIPLE EXTRACTIONS WITH ALVEOLOPLASTY  06/08/2011   Procedure: MULTIPLE EXTRACION WITH ALVEOLOPLASTY;  Surgeon: Lenn Cal, DDS;  Location: [facility] ORS;  Service: Oral Surgery;  Laterality: N/A;  Extraction of tooth #'s 3,4,7 with alveoloplasty and Gross Debridement of Remaining Teeth  . [place] LIVER TUMOR  2011; 01/26/2012  . THYROID LOBECTOMY  02/16/2012   Procedure: THYROID LOBECTOMY;  Surgeon: Earnstine Regal, MD;  Location: [facility] ORS;  Service: General;  Laterality: Right;  . TONSILLECTOMY  1969    Social History   Social History  . Marital status: Divorced    Spouse name: N/A  . Number of children: 1  . Years of education: N/A   Occupational History  . Not on file.   Social History Main Topics  . Smoking status: Never Smoker  . Smokeless tobacco:  Never Used     Comment: NEVER USED TOBACCO  . Alcohol use No  . Drug use: No  . Sexual activity: Not Currently   Other Topics Concern  . Not on file   Social History Narrative   Caffeine use: 3 cups coffee/tea daily   Regular exercise: no         Family History  Problem Relation Age of Onset  . Cancer Mother     breast  . Hypertension Mother   . Anesthesia problems Daughter       VITAL SIGNS BP 125/80   Pulse 86   Temp 97.8 F (36.6 C) (Oral)   Resp 20   Ht 5\' 6"  (1.676 m)   Wt 270 lb (122.5 kg)   LMP 08/30/2006   SpO2 99%   BMI 43.58 kg/m   Patient's Medications  New Prescriptions   No medications on file  Previous Medications   CIPROFLOXACIN (CIPRO) 500 MG TABLET    Take 1 tablet (500 mg total) by mouth 2 (two) times daily.   CYANOCOBALAMIN (,VITAMIN B-12,) 1000 MCG/ML INJECTION    INJECT 1 ML AS DIRECTED EVERY 21 DAYS   FENTANYL (DURAGESIC - DOSED MCG/HR) 25 MCG/HR PATCH    Place 1 patch (25 mcg total) onto the skin every 3 (three) days.   FLUDROCORTISONE (FLORINEF) 0.1 MG TABLET    Take 1 tablet (100 mcg total) by mouth 2 (two) times daily.   FLUOXETINE (PROZAC) 40 MG CAPSULE    Take 40 mg by mouth daily before breakfast.    LEVOTHYROXINE (SYNTHROID, LEVOTHROID) 200 MCG TABLET    Take 200 mcg by mouth daily with breakfast.   LIDOCAINE-PRILOCAINE (EMLA) CREAM    Apply 1 application topically as needed for port access   LIPASE/PROTEASE/AMYLASE 24000-76000 UNITS CPEP    Take 1 capsule (24,000 Units total) by mouth 3 (three) times daily before meals.   LORAZEPAM (ATIVAN) 0.5 MG TABLET    PLACE 1 TABLET UNDER TONGUE EVERY 6 HOURS AS NEEDED FOR NAUSEA AND VOMITING   METHOCARBAMOL (ROBAXIN) 750 MG TABLET    TAKE 1 TABLET BY MOUTH THREE TIMES DAILY AS NEEDED   MULTIPLE VITAMIN (MULTI-VITAMINS) TABS    Take 1 tablet by mouth daily.   NITROGLYCERIN (NITROSTAT) 0.4 MG SL TABLET    Place 1 tablet (0.4 mg total) under the tongue every 5 (five) minutes as needed for chest  pain.   ONDANSETRON (ZOFRAN) 8 MG TABLET    TAKE 1 TABLET BY MOUTH EVERY 8 HOURS AS NEEDED FOR NAUSEA AND VOMITING.   OXYCODONE HCL 20 MG TABS    Take 1 tablet (20 mg total) by mouth every 4 (four) hours as needed.   PANTOPRAZOLE (PROTONIX) 40 MG TABLET  Take 1 tablet (40 mg total) by mouth 2 (two) times daily.   POLYETHYLENE GLYCOL (MIRALAX / GLYCOLAX) PACKET    Take 17 g by mouth 2 (two) times daily.   PREGABALIN (LYRICA) 200 MG CAPSULE    Take 1 capsule (200 mg total) by mouth 2 (two) times daily.   PROCHLORPERAZINE (COMPAZINE) 10 MG TABLET    TAKE 1 TABLET BY MOUTH EVERY 6 HOURS AS NEEDED FOR NAUSEA AND VOMITING   PROMETHAZINE (PHENERGAN) 25 MG TABLET    TAKE 1 TABLET BY MOUTH EVERY 8 HOURS AS NEEDED FOR NAUSEA OR VOMITING.   SUVOREXANT (BELSOMRA) 20 MG TABS    Take 20 mg by mouth at bedtime as needed.  Modified Medications   No medications on file  Discontinued Medications   No medications on file     SIGNIFICANT DIAGNOSTIC EXAMS   LABS REVIEWED:   03-19-16: glucose 93; bun 17; creat 0.55; k+ 3.6 na++ 143  Review of Systems  Constitutional: Positive for malaise/fatigue.  Respiratory: Negative for cough and shortness of breath.   Cardiovascular: Negative for chest pain, palpitations and leg swelling.  Gastrointestinal: Positive for abdominal pain. Negative for constipation and heartburn.  Musculoskeletal: Positive for myalgias. Negative for back pain and joint pain.  Skin: Negative.   Neurological: Negative for dizziness.  Psychiatric/Behavioral: The patient is nervous/anxious.     Physical Exam  Constitutional: She is oriented to person, place, and time. No distress.  Obese   Eyes: Conjunctivae are normal.  Neck: Neck supple. No JVD present. No thyromegaly present.  Cardiovascular: Normal rate, regular rhythm and intact distal pulses.   Respiratory: Effort normal and breath sounds normal. No respiratory distress. She has no wheezes.  GI: Soft. Bowel sounds are normal.  She exhibits no distension. There is no tenderness.  Musculoskeletal: She exhibits edema.  Able to move all extremities  Has generalized weakness present Has 2+ generalized edema present.   Lymphadenopathy:    She has no cervical adenopathy.  Neurological: She is alert and oriented to person, place, and time.  Skin: Skin is warm and dry. She is not diaphoretic.  Dressing intact to her ulcerations   Psychiatric: She has a normal mood and affect.      ASSESSMENT/ PLAN:  Will discharge her to SNF per family request. I have written prescriptions for her narcotics. She will not need any dme or home health at this time. She will follow up with the medical providers at the receiving facility.    Time spent with patient  35  minutes >50% time spent counseling; reviewing medical record; tests; labs; and developing future plan of care   Ok Edwards NP [facility]  Contact [phone] Monday through Friday 8am- 5pm  After hours call [phone]

## 2016-03-22 ENCOUNTER — Other Ambulatory Visit: Payer: Self-pay

## 2016-03-22 DIAGNOSIS — I150 Renovascular hypertension: Secondary | ICD-10-CM

## 2016-03-22 DIAGNOSIS — C50919 Malignant neoplasm of unspecified site of unspecified female breast: Secondary | ICD-10-CM

## 2016-03-22 DIAGNOSIS — G4701 Insomnia due to medical condition: Secondary | ICD-10-CM

## 2016-03-22 DIAGNOSIS — C50911 Malignant neoplasm of unspecified site of right female breast: Secondary | ICD-10-CM

## 2016-03-22 DIAGNOSIS — D500 Iron deficiency anemia secondary to blood loss (chronic): Secondary | ICD-10-CM

## 2016-03-22 DIAGNOSIS — D510 Vitamin B12 deficiency anemia due to intrinsic factor deficiency: Secondary | ICD-10-CM

## 2016-03-22 DIAGNOSIS — C50912 Malignant neoplasm of unspecified site of left female breast: Secondary | ICD-10-CM

## 2016-03-22 DIAGNOSIS — C787 Secondary malignant neoplasm of liver and intrahepatic bile duct: Secondary | ICD-10-CM

## 2016-03-22 LAB — AEROBIC/ANAEROBIC CULTURE (SURGICAL/DEEP WOUND)

## 2016-03-22 LAB — AEROBIC/ANAEROBIC CULTURE W GRAM STAIN (SURGICAL/DEEP WOUND)

## 2016-03-22 MED ORDER — OXYCODONE HCL 20 MG PO TABS: 20.0000 mg | ORAL_TABLET | ORAL | 0 refills | Status: DC | PRN

## 2016-03-22 MED ORDER — FENTANYL 100 MCG/HR TD PT72: 100.0000 ug | MEDICATED_PATCH | TRANSDERMAL | 0 refills | Status: DC

## 2016-03-22 MED ORDER — FENTANYL 25 MCG/HR TD PT72: 25.0000 ug | MEDICATED_PATCH | TRANSDERMAL | 0 refills | Status: DC

## 2016-03-26 ENCOUNTER — Other Ambulatory Visit (INDEPENDENT_AMBULATORY_CARE_PROVIDER_SITE_OTHER): Payer: Self-pay | Admitting: Orthopaedic Surgery

## 2016-03-26 ENCOUNTER — Other Ambulatory Visit: Payer: Self-pay | Admitting: Hematology & Oncology

## 2016-03-30 ENCOUNTER — Other Ambulatory Visit: Payer: Self-pay | Admitting: Hematology & Oncology

## 2016-03-30 DIAGNOSIS — E039 Hypothyroidism, unspecified: Secondary | ICD-10-CM | POA: Diagnosis not present

## 2016-03-30 DIAGNOSIS — J9690 Respiratory failure, unspecified, unspecified whether with hypoxia or hypercapnia: Secondary | ICD-10-CM | POA: Diagnosis not present

## 2016-03-30 DIAGNOSIS — C50919 Malignant neoplasm of unspecified site of unspecified female breast: Secondary | ICD-10-CM | POA: Diagnosis not present

## 2016-03-30 DIAGNOSIS — I509 Heart failure, unspecified: Secondary | ICD-10-CM | POA: Diagnosis not present

## 2016-03-30 DIAGNOSIS — J189 Pneumonia, unspecified organism: Secondary | ICD-10-CM | POA: Diagnosis not present

## 2016-03-30 DIAGNOSIS — A419 Sepsis, unspecified organism: Secondary | ICD-10-CM | POA: Diagnosis not present

## 2016-03-30 DIAGNOSIS — L89154 Pressure ulcer of sacral region, stage 4: Secondary | ICD-10-CM | POA: Diagnosis not present

## 2016-04-04 ENCOUNTER — Encounter: Payer: Medicare Other | Attending: Internal Medicine | Admitting: Internal Medicine

## 2016-04-04 DIAGNOSIS — Z9013 Acquired absence of bilateral breasts and nipples: Secondary | ICD-10-CM | POA: Insufficient documentation

## 2016-04-04 DIAGNOSIS — L89512 Pressure ulcer of right ankle, stage 2: Secondary | ICD-10-CM | POA: Diagnosis not present

## 2016-04-04 DIAGNOSIS — L89154 Pressure ulcer of sacral region, stage 4: Secondary | ICD-10-CM | POA: Diagnosis not present

## 2016-04-04 DIAGNOSIS — L89522 Pressure ulcer of left ankle, stage 2: Secondary | ICD-10-CM | POA: Diagnosis not present

## 2016-04-04 DIAGNOSIS — D649 Anemia, unspecified: Secondary | ICD-10-CM | POA: Insufficient documentation

## 2016-04-04 DIAGNOSIS — G629 Polyneuropathy, unspecified: Secondary | ICD-10-CM | POA: Insufficient documentation

## 2016-04-04 DIAGNOSIS — L89523 Pressure ulcer of left ankle, stage 3: Secondary | ICD-10-CM | POA: Diagnosis not present

## 2016-04-04 DIAGNOSIS — L89513 Pressure ulcer of right ankle, stage 3: Secondary | ICD-10-CM | POA: Insufficient documentation

## 2016-04-04 DIAGNOSIS — I890 Lymphedema, not elsewhere classified: Secondary | ICD-10-CM | POA: Insufficient documentation

## 2016-04-04 DIAGNOSIS — I509 Heart failure, unspecified: Secondary | ICD-10-CM | POA: Insufficient documentation

## 2016-04-04 DIAGNOSIS — Z853 Personal history of malignant neoplasm of breast: Secondary | ICD-10-CM | POA: Insufficient documentation

## 2016-04-05 NOTE — Progress Notes (Signed)
Kerri, Vasquez ([id number]) Visit Report for 04/04/2016 Abuse/Suicide Risk Screen Details MARTINEZ-MAHJOUBA, Date of Service: 04/04/2016 3:00 PM Patient Name: Kerri Vasquez Patient Account Number: [id number] Medical Record Treating RN: Ahmed Prima [id number] Number: Other Clinician: Date of Birth/Sex: Oct 28, 1955 (60 y.o. Female) Treating ROBSON, MICHAEL Primary Care Physician/Extender: Athena Masse, [place] Physician: Referring Physician: Wenda Low Weeks in Treatment: 0 Abuse/Suicide Risk Screen Items Answer ABUSE/SUICIDE RISK SCREEN: Has anyone close to you tried to hurt or harm you recentlyo No Do you feel uncomfortable with anyone in your familyo No Has anyone forced you do things that you didnot want to doo No Do you have any thoughts of harming yourselfo No Patient displays signs or symptoms of abuse and/or neglect. No Electronic Signature(s) Signed: 04/04/2016 4:57:07 PM By: Alric Quan Entered By: Alric Quan on 04/04/2016 15:47:18 Coralyn Mark ([id number]) -------------------------------------------------------------------------------- Activities of Daily Living Details MARTINEZ-MAHJOUBA, Date of Service: 04/04/2016 3:00 PM Patient Name: Kerri Vasquez Patient Account Number: [id number] Medical Record Treating RN: Ahmed Prima [id number] Number: Other Clinician: Date of Birth/Sex: 09/26/1955 (60 y.o. Female) Treating ROBSON, MICHAEL Primary Care Physician/Extender: Athena Masse, [place] Physician: Referring Physician: Wenda Low Weeks in Treatment: 0 Activities of Daily Living Items Answer Activities of Daily Living (Please select one for each item) Drive Automobile Not Able Take Medications Need Assistance Use Telephone Need Assistance Care for Appearance Need Assistance Use Toilet Need Assistance Bath / Shower Need Assistance Dress Self Need Assistance Feed Self Completely Able Walk Not Able Get In / Out Bed Need Assistance Housework Need  Assistance Prepare Meals Need Assistance Handle Money Need Assistance Shop for Self Not Able Electronic Signature(s) Signed: 04/04/2016 4:57:07 PM By: Alric Quan Entered By: Alric Quan on 04/04/2016 15:49:22 Coralyn Mark ([id number]) -------------------------------------------------------------------------------- Education Assessment Details MARTINEZ-MAHJOUBA, Date of Service: 04/04/2016 3:00 PM Patient Name: Kerri Vasquez Patient Account Number: [id number] Medical Record Treating RN: Ahmed Prima [id number] Number: Other Clinician: Date of Birth/Sex: May 27, 1955 (60 y.o. Female) Treating ROBSON, MICHAEL Primary Care Physician/Extender: Athena Masse, [place] Physician: Referring Physician: Andres Labrum in Treatment: 0 Primary Learner Assessed: Caregiver nurse Learning Preferences/Education Level/Primary Language Learning Preference: Explanation, Demonstration Highest Education Level: College or Above Preferred Language: English Cognitive Barrier Assessment/Beliefs Language Barrier: No Translator Needed: No Memory Deficit: No Emotional Barrier: No Cultural/Religious Beliefs Affecting Medical No Care: Physical Barrier Assessment Impaired Vision: No Impaired Hearing: No Decreased Hand dexterity: No Knowledge/Comprehension Assessment Knowledge Level: Medium Comprehension Level: Medium Ability to understand written Medium instructions: Ability to understand verbal Medium instructions: Motivation Assessment Anxiety Level: Calm Cooperation: Cooperative Education Importance: Acknowledges Need Interest in Health Problems: Asks Questions Perception: Coherent Willingness to Engage in Self- Medium Management Activities: Medium BALI, VIELE ([id number]) Readiness to Engage in Self- Management Activities: Electronic Signature(s) Signed: 04/04/2016 4:57:07 PM By: Alric Quan Entered By: Alric Quan on 04/04/2016  15:47:49 Coralyn Mark ([id number]) -------------------------------------------------------------------------------- Fall Risk Assessment Details MARTINEZ-MAHJOUBA, Date of Service: 04/04/2016 3:00 PM Patient Name: Kerri Vasquez Patient Account Number: [id number] Medical Record Treating RN: Ahmed Prima [id number] Number: Other Clinician: Date of Birth/Sex: 01-18-1956 (60 y.o. Female) Treating ROBSON, MICHAEL Primary Care Physician/Extender: Athena Masse, [place] Physician: Referring Physician: Wenda Low Weeks in Treatment: 0 Fall Risk Assessment Items Have you had 2 or more falls in the last 12 monthso 0 Yes Have you had any fall that resulted in injury in the last 12 monthso 0 Yes FALL RISK ASSESSMENT: History of falling - immediate or within 3 months 25 Yes Secondary diagnosis 0 No Ambulatory aid None/bed rest/wheelchair/nurse 0  Yes Crutches/cane/walker 0 No Furniture 0 No IV Access/Saline Lock 0 No Gait/Training Normal/bed rest/immobile 0 No Weak 10 Yes Impaired 0 No Mental Status Oriented to own ability 0 Yes Electronic Signature(s) Signed: 04/04/2016 4:57:07 PM By: Alric Quan Entered By: Alric Quan on 04/04/2016 15:48:42 Coralyn Mark ([id number]) -------------------------------------------------------------------------------- Foot Assessment Details MARTINEZ-MAHJOUBA, Date of Service: 04/04/2016 3:00 PM Patient Name: Kerri Vasquez Patient Account Number: [id number] Medical Record Treating RN: Ahmed Prima [id number] Number: Other Clinician: Date of Birth/Sex: 14-Mar-1956 (60 y.o. Female) Treating ROBSON, MICHAEL Primary Care Physician/Extender: Athena Masse, [place] Physician: Referring Physician: Wenda Low Weeks in Treatment: 0 Foot Assessment Items Site Locations + = Sensation present, - = Sensation absent, C = Callus, U = Ulcer R = Redness, W = Warmth, M = Maceration, PU = Pre-ulcerative lesion F = Fissure, S = Swelling, D =  Dryness Assessment Right: Left: Other Deformity: No No Prior Foot Ulcer: No No Prior Amputation: No No Charcot Joint: No No Ambulatory Status: Non-ambulatory Assistance Device: Art therapist) Signed: 04/04/2016 4:57:07 PM By: Lura Em, Kerri Vasquez ([id number]) Entered By: Alric Quan on 04/04/2016 15:49:46 Coralyn Mark ([id number]) -------------------------------------------------------------------------------- Nutrition Risk Assessment Details MARTINEZ-MAHJOUBA, Date of Service: 04/04/2016 3:00 PM Patient Name: Kerri Vasquez Patient Account Number: [id number] Medical Record Treating RN: Ahmed Prima [id number] Number: Other Clinician: Date of Birth/Sex: [birth year] (60 y.o. Female) Treating ROBSON, MICHAEL Primary Care Physician/Extender: Athena Masse, [place] Physician: Referring Physician: Wenda Low Weeks in Treatment: 0 Height (in): 66 Weight (lbs): Body Mass Index (BMI): Nutrition Risk Assessment Items NUTRITION RISK SCREEN: I have an illness or condition that made me change the kind and/or 0 No amount of food I eat I eat fewer than two meals per day 0 No I eat few fruits and vegetables, or milk products 0 No I have three or more drinks of beer, liquor or wine almost every day 0 No I have tooth or mouth problems that make it hard for me to eat 0 No I don't always have enough money to buy the food I need 0 No I eat alone most of the time 0 No I take three or more different prescribed or over-the-counter drugs a 1 Yes day Without wanting to, I have lost or gained 10 pounds in the last six 0 No months I am not always physically able to shop, cook and/or feed myself 0 No Nutrition Protocols Good Risk Protocol 0 No interventions needed Moderate Risk Protocol Electronic Signature(s) Signed: 04/04/2016 4:57:07 PM By: Alric Quan Entered By: Alric Quan on 04/04/2016 15:48:16

## 2016-04-05 NOTE — Progress Notes (Signed)
TEASHA, KUBACKI ([id number]) Visit Report for 04/04/2016 Chief Complaint Document Details Vasquez, Date of Service: 04/04/2016 3:00 PM Patient Name: Kerri Vasquez Patient Account Number: [id number] Medical Record Treating RN: [id number] Number: Other Clinician: Date of Birth/Sex: [birth year] (60 y.o. Female) Treating ROBSON, MICHAEL Primary Care Physician/Extender: Athena Masse, [place] Physician: Referring Physician: Wenda Low Weeks in Treatment: 0 Information Obtained from: Patient Chief Complaint 04/04/16; patient is here for review of a large decubitus ulcer over her lower sacrum and bilateral Botox. This she also has 2 pressure areas on the bilateral lateral malleoli Electronic Signature(s) Signed: 04/04/2016 5:08:56 PM By: Linton Ham MD Entered By: Linton Ham on 04/04/2016 16:33:28 Coralyn Mark ([id number]) -------------------------------------------------------------------------------- Debridement Details Marella Bile, Date of Service: 04/04/2016 3:00 PM Patient Name: Kerri Vasquez Patient Account Number: [id number] Medical Record Treating RN: Ahmed Prima [id number] Number: Other Clinician: Date of Birth/Sex: Feb 02, 1956 (60 y.o. Female) Treating ROBSON, MICHAEL Primary Care Physician/Extender: Athena Masse, [place] Physician: Referring Physician: Wenda Low Weeks in Treatment: 0 Debridement Performed for Wound #1 Right,Lateral Malleolus Assessment: Performed By: Physician Ricard Dillon, MD Debridement: Debridement Pre-procedure Yes - 16:05 Verification/Time Out Taken: Start Time: 16:06 Pain Control: Lidocaine 4% Topical Solution Level: Skin/Subcutaneous Tissue Total Area Debrided (L x 0.8 (cm) x 0.8 (cm) = 0.64 (cm) W): Tissue and other Viable, Non-Viable, Exudate, Fibrin/Slough, Subcutaneous material debrided: Instrument: Curette Bleeding: Minimum Hemostasis Achieved: Pressure End Time: 16:08 Procedural Pain: 0 Post  Procedural Pain: 0 Response to Treatment: Procedure was tolerated well Post Debridement Measurements of Total Wound Length: (cm) 0.8 Stage: Category/Stage II Width: (cm) 0.8 Depth: (cm) 0.1 Volume: (cm) 0.05 Character of Wound/Ulcer Post Requires Further Debridement: Debridement Severity of Tissue Post Fat layer exposed Debridement: Post Procedure Diagnosis Same as Pre-procedure Electronic Signature(s) DAJAHNAE, HOLLIGAN ([id number]) Signed: 04/04/2016 4:57:07 PM By: Alric Quan Signed: 04/04/2016 5:08:56 PM By: Linton Ham MD Entered By: Alric Quan on 04/04/2016 16:06:51 Coralyn Mark ([id number]) -------------------------------------------------------------------------------- Debridement Details Marella Bile, Date of Service: 04/04/2016 3:00 PM Patient Name: Kerri Vasquez Patient Account Number: [id number] Medical Record Treating RN: Ahmed Prima [id number] Number: Other Clinician: Date of Birth/Sex: 12-30-1955 (60 y.o. Female) Treating ROBSON, MICHAEL Primary Care Physician/Extender: Athena Masse, [place] Physician: Referring Physician: Wenda Low Weeks in Treatment: 0 Debridement Performed for Wound #2 Left,Lateral Malleolus Assessment: Performed By: Physician Ricard Dillon, MD Debridement: Debridement Pre-procedure Yes - 16:05 Verification/Time Out Taken: Start Time: 16:08 Pain Control: Lidocaine 4% Topical Solution Level: Skin/Subcutaneous Tissue Total Area Debrided (L x 1.3 (cm) x 2 (cm) = 2.6 (cm) W): Tissue and other Viable, Non-Viable, Exudate, Fibrin/Slough, Subcutaneous material debrided: Instrument: Curette Bleeding: Minimum Hemostasis Achieved: Pressure End Time: 16:10 Procedural Pain: 0 Post Procedural Pain: 0 Response to Treatment: Procedure was tolerated well Post Debridement Measurements of Total Wound Length: (cm) 1.3 Stage: Category/Stage II Width: (cm) 2 Depth: (cm) 0.2 Volume: (cm) 0.408 Character  of Wound/Ulcer Post Requires Further Debridement: Debridement Severity of Tissue Post Fat layer exposed Debridement: Post Procedure Diagnosis Same as Pre-procedure Electronic Signature(s) Kerri Vasquez, Kerri Vasquez ([id number]) Signed: 04/04/2016 4:57:07 PM By: Alric Quan Signed: 04/04/2016 5:08:56 PM By: Linton Ham MD Entered By: Alric Quan on 04/04/2016 16:07:25 Coralyn Mark ([id number]) -------------------------------------------------------------------------------- HPI Details Marella Bile, Date of Service: 04/04/2016 3:00 PM Patient Name: Kerri Vasquez Patient Account Number: [id number] Medical Record Treating RN: [id number] Number: Other Clinician: Date of Birth/Sex: May 20, 1955 (60 y.o. Female) Treating ROBSON, MICHAEL Primary Care Physician/Extender: Athena Masse, [place] Physician: Referring Physician: Wenda Low Weeks in Treatment: 0 History of Present Illness HPI Description:  04/04/16; this is a 60 year old woman who was hospitalized in October from 10/4 through 10/11. At that point noted to have deep tissue injury over her lower sacral area. Her admission was prompted by of failure to thrive type presentation. She was readmitted the hospital on 11/14 through 11/20 with acute respiratory failure secondary to pneumonia. At that point she had surgical consultation for debridement of a large sacral decubitus ulcer. Cultures of this grew a few Proteus and MRSA. Her albumin was 2.5 white count 10.7 hemoglobin 10.3 she was given IV antibiotics in the hospital and 7 days of Cipro at discharge. She had multiple MRIs including her brain, C-spine, T-spine and L-spine. None of these showed metastatic disease or other abnormalities. The patient states that her mobility decline since the summer using a cane. By October she was so weak she could not get off the toilet I think that's what prompted the admission to hospital in October. The patient also has bilateral  wounds over her left greater than right lateral malleoli of her ankles. The patient states this happened in the hospital in November as well. ABI in the right was 1.11 on the left 1.09 Electronic Signature(s) Signed: 04/04/2016 5:08:56 PM By: Linton Ham MD Entered By: Linton Ham on 04/04/2016 16:38:42 Coralyn Mark ([id number]) -------------------------------------------------------------------------------- Physical Exam Details Marella Bile, Date of Service: 04/04/2016 3:00 PM Patient Name: Kerri Vasquez Patient Account Number: [id number] Medical Record Treating RN: [id number] Number: Other Clinician: Date of Birth/Sex: 1955-05-23 (60 y.o. Female) Treating ROBSON, MICHAEL Primary Care Physician/Extender: Athena Masse, [place] Physician: Referring Physician: Wenda Low Weeks in Treatment: 0 Constitutional Sitting or standing Blood Pressure is within target range for patient.. Pulse regular and within target range for patient.Marland Kitchen Respirations regular, non-labored and within target range.. Temperature is normal and within the target range for the patient.. Patient's appearance is neat and clean. Appears in no acute distress. Well nourished and well developed.. Eyes Conjunctivae clear. No discharge. No lid fatigue. Extraocular movements are normal. Respiratory Respiratory effort is easy and symmetric bilaterally. Rate is normal at rest and on room air.. Bilateral breath sounds are clear and equal in all lobes with no wheezes, rales or rhonchi.. Cardiovascular Heart rhythm and rate regular, without murmur or gallop.Marland Kitchen Lymphedema in the right arm I think from previous breast cancer. Probably some in the lower extremities as well. Gastrointestinal (GI) Abdomen is soft and non-distended without masses or tenderness. Bowel sounds active in all quadrants.. No liver or spleen enlargement or tenderness.. Genitourinary (GU) Foley catheter in place. Lymphatic None palpable in the  cervical chain. Integumentary (Hair, Skin) No obvious rashes seen.. Neurological Cranial nerves III 08/05/10 all within normal limits.. Her knee jerks I think are normal surprisingly both toes were downgoing. There is no Hoffman's reflex. She does not have a sensory level. Notes Wound exam oThe patient has a very large pressure area over her lower sacrum and bilateral Buttock. This is stage IV with exposed bone in the right pelvis. The bottom of this wound is in very close proximity to her anal opening and would not be amenable to wound VAC because of this. The tissue itself does not look horrible perhaps 15% adherent slough. No attempt was made to debridement this today. I did not attempt to remove any of the bone. oOver the left greater than right lateral malleoli are stage III pressure ulcers. Both of these debrided with a Vasquez, Kerri A. ([id number]) #3 curet of surface slough and nonviable subcutaneous tissue. Her peripheral pulses are palpable.  This does not appear to be ischemic. No evidence of surrounding infection Electronic Signature(s) Signed: 04/04/2016 5:08:56 PM By: Linton Ham MD Entered By: Linton Ham on 04/04/2016 16:43:47 Coralyn Mark ([id number]) -------------------------------------------------------------------------------- Physician Orders Details Marella Bile, Date of Service: 04/04/2016 3:00 PM Patient Name: Kerri Vasquez Patient Account Number: [id number] Medical Record Treating RN: Ahmed Prima [id number] Number: Other Clinician: Date of Birth/Sex: [birth year] (59 y.o. Female) Treating ROBSON, MICHAEL Primary Care Physician/Extender: Athena Masse, [place] Physician: Referring Physician: Andres Labrum in Treatment: 0 Verbal / Phone Orders: Yes Clinician: Pinkerton, Debi Read Back and Verified: Yes Diagnosis Coding Wound Cleansing Wound #1 Right,Lateral Malleolus o Clean wound with Normal Saline. Wound #2 Left,Lateral  Malleolus o Clean wound with Normal Saline. Wound #3 Midline Coccyx o Clean wound with Normal Saline. Anesthetic Wound #1 Right,Lateral Malleolus o Topical Lidocaine 4% cream applied to wound bed prior to debridement - for clinic use Wound #2 Left,Lateral Malleolus o Topical Lidocaine 4% cream applied to wound bed prior to debridement - for clinic use Wound #3 Midline Coccyx o Topical Lidocaine 4% cream applied to wound bed prior to debridement - for clinic use Skin Barriers/Peri-Wound Care Wound #1 Right,Lateral Malleolus o Skin Prep Wound #2 Left,Lateral Malleolus o Skin Prep Wound #3 Midline Coccyx o Skin Prep Primary Wound Dressing Wound #1 Right,Lateral Malleolus o Santyl Ointment Kerri Vasquez, Kerri A. ([id number]) Wound #2 Left,Lateral Malleolus o Santyl Ointment Wound #3 Midline Coccyx o Saline moistened gauze - pack light into wound o Hydrogel Secondary Dressing Wound #1 Right,Lateral Malleolus o Dry Gauze o Boardered Foam Dressing Wound #2 Left,Lateral Malleolus o Dry Gauze o Boardered Foam Dressing Wound #3 Midline Coccyx o ABD pad o Dry Gauze o XtraSorb Dressing Change Frequency Wound #1 Right,Lateral Malleolus o Change dressing every day. Wound #2 Left,Lateral Malleolus o Change dressing every day. Wound #3 Midline Coccyx o Change dressing every day. - unless soiled Follow-up Appointments Wound #1 Right,Lateral Malleolus o Return Appointment in 2 weeks. Wound #2 Left,Lateral Malleolus o Return Appointment in 2 weeks. Wound #3 Midline Coccyx o Return Appointment in 2 weeks. Off-Loading Wound #1 Right,Lateral Malleolus o Turn and reposition every 2 hours o Other: - float heels when in the bed Wound #2 Left,Lateral Malleolus Vasquez, Kerri A. ([id number]) o Turn and reposition every 2 hours o Other: - float heels when in the bed Wound #3 Midline Coccyx o Turn and reposition  every 2 hours o Other: - float heels when in the bed Additional Orders / Instructions Wound #1 Right,Lateral Malleolus o Increase protein intake. Wound #2 Left,Lateral Malleolus o Increase protein intake. Wound #3 Midline Coccyx o Increase protein intake. Medications-please add to medication list. Wound #1 Right,Lateral Malleolus o Other: - Vitamin C, Zinc, Multivitamin Wound #2 Left,Lateral Malleolus o Other: - Vitamin C, Zinc, Multivitamin Wound #3 Midline Coccyx o Other: - Vitamin C, Zinc, Multivitamin Electronic Signature(s) Signed: 04/04/2016 4:57:07 PM By: Alric Quan Signed: 04/04/2016 5:08:56 PM By: Linton Ham MD Entered By: Alric Quan on 04/04/2016 16:16:20 Coralyn Mark ([id number]) -------------------------------------------------------------------------------- Problem List Details Marella Bile, Date of Service: 04/04/2016 3:00 PM Patient Name: Kerri Vasquez Patient Account Number: [id number] Medical Record Treating RN: [id number] Number: Other Clinician: Date of Birth/Sex: 10-05-1955 (60 y.o. Female) Treating ROBSON, MICHAEL Primary Care Physician/Extender: Athena Masse, [place] Physician: Referring Physician: Wenda Low Weeks in Treatment: 0 Active Problems ICD-10 Encounter Code Description Active Date Diagnosis L89.154 Pressure ulcer of sacral region, stage 4 04/04/2016 Yes L89.523 Pressure ulcer of left ankle, stage 3 04/04/2016 Yes L89.513 Pressure  ulcer of right ankle, stage 3 04/04/2016 Yes Inactive Problems Resolved Problems Electronic Signature(s) Signed: 04/04/2016 5:08:56 PM By: Linton Ham MD Entered By: Linton Ham on 04/04/2016 16:32:38 Coralyn Mark ([id number]) -------------------------------------------------------------------------------- Progress Note Details Marella Bile, Date of Service: 04/04/2016 3:00 PM Patient Name: Kerri Vasquez Patient Account Number: [id number] Medical Record  Treating RN: [id number] Number: Other Clinician: Date of Birth/Sex: 19-May-1955 (60 y.o. Female) Treating ROBSON, MICHAEL Primary Care Physician/Extender: Athena Masse, [place] Physician: Referring Physician: Wenda Low Weeks in Treatment: 0 Subjective Chief Complaint Information obtained from Patient 04/04/16; patient is here for review of a large decubitus ulcer over her lower sacrum and bilateral Botox. This she also has 2 pressure areas on the bilateral lateral malleoli History of Present Illness (HPI) 04/04/16; this is a 60 year old woman who was hospitalized in October from 10/4 through 10/11. At that point noted to have deep tissue injury over her lower sacral area. Her admission was prompted by of failure to thrive type presentation. She was readmitted the hospital on 11/14 through 11/20 with acute respiratory failure secondary to pneumonia. At that point she had surgical consultation for debridement of a large sacral decubitus ulcer. Cultures of this grew a few Proteus and MRSA. Her albumin was 2.5 white count 10.7 hemoglobin 10.3 she was given IV antibiotics in the hospital and 7 days of Cipro at discharge. She had multiple MRIs including her brain, C-spine, T-spine and L-spine. None of these showed metastatic disease or other abnormalities. The patient states that her mobility decline since the summer using a cane. By October she was so weak she could not get off the toilet I think that's what prompted the admission to hospital in October. The patient also has bilateral wounds over her left greater than right lateral malleoli of her ankles. The patient states this happened in the hospital in November as well. ABI in the right was 1.11 on the left 1.09 Wound History Patient presents with 3 open wounds that have been present for approximately 4 months. Patient has been treating wounds in the following manner: saline with rolled gauze. Laboratory tests have been performed in the last  month. Patient reportedly has tested positive for an antibiotic resistant organism. Patient reportedly has not tested positive for osteomyelitis. Patient reportedly has not had testing performed to evaluate circulation in the legs. Patient History Information obtained from Patient. Allergies influenza virus vacc trivalent, whole, pneumococcal vaccine, Ritalin, Zostavax (PF), adhesive tape JANIYLAH, COOKSLEY A. ([id number]) Social History Never smoker, Marital Status - Divorced, Alcohol Use - Never, Drug Use - No History, Caffeine Use - Never. Medical History Hematologic/Lymphatic Patient has history of Anemia, Lymphedema - r arm Cardiovascular Patient has history of Congestive Heart Failure Neurologic Patient has history of Neuropathy Oncologic Patient has history of Received Chemotherapy Denies history of Received Radiation Medical And Surgical History Notes Oncologic breast cancer - bilateral mastectomy - 9 years ago Review of Systems (ROS) Constitutional Symptoms (General Health) The patient has no complaints or symptoms. Eyes Complains or has symptoms of Glasses / Contacts - glasses. Ear/Nose/Mouth/Throat The patient has no complaints or symptoms. Hematologic/Lymphatic The patient has no complaints or symptoms. Respiratory The patient has no complaints or symptoms. Cardiovascular The patient has no complaints or symptoms. Gastrointestinal The patient has no complaints or symptoms. Endocrine The patient has no complaints or symptoms. Genitourinary The patient has no complaints or symptoms. Immunological The patient has no complaints or symptoms. Integumentary (Skin) The patient has no complaints or symptoms. Musculoskeletal The patient has no complaints  or symptoms. Neurologic The patient has no complaints or symptoms. Oncologic The patient has no complaints or symptoms. Kerri Vasquez, Kerri A. ([id number]) Objective Constitutional Sitting or standing  Blood Pressure is within target range for patient.. Pulse regular and within target range for patient.Marland Kitchen Respirations regular, non-labored and within target range.. Temperature is normal and within the target range for the patient.. Patient's appearance is neat and clean. Appears in no acute distress. Well nourished and well developed.. Vitals Time Taken: 3:05 PM, Height: 66 in, Source: Stated, Temperature: 97.8 F, Pulse: 87 bpm, Respiratory Rate: 16 breaths/min, Blood Pressure: 104/56 mmHg. Eyes Conjunctivae clear. No discharge. No lid fatigue. Extraocular movements are normal. Respiratory Respiratory effort is easy and symmetric bilaterally. Rate is normal at rest and on room air.. Bilateral breath sounds are clear and equal in all lobes with no wheezes, rales or rhonchi.. Cardiovascular Heart rhythm and rate regular, without murmur or gallop.Marland Kitchen Lymphedema in the right arm I think from previous breast cancer. Probably some in the lower extremities as well. Gastrointestinal (GI) Abdomen is soft and non-distended without masses or tenderness. Bowel sounds active in all quadrants.. No liver or spleen enlargement or tenderness.. Genitourinary (GU) Foley catheter in place. Lymphatic None palpable in the cervical chain. Neurological Cranial nerves III 08/05/10 all within normal limits.. Her knee jerks I think are normal surprisingly both toes were downgoing. There is no Hoffman's reflex. She does not have a sensory level. General Notes: Wound exam The patient has a very large pressure area over her lower sacrum and bilateral Buttock. This is stage IV with exposed bone in the right pelvis. The bottom of this wound is in very close proximity to her anal opening and would not be amenable to wound VAC because of this. The tissue itself does not look horrible perhaps 15% adherent slough. No attempt was made to debridement this today. I did not attempt to remove any of the bone. Over the left greater  than right lateral malleoli are stage III pressure ulcers. Both of these debrided with a #3 curet of surface slough and nonviable subcutaneous tissue. Her peripheral pulses are palpable. This does not appear to be ischemic. No evidence of surrounding infection Vasquez, Kerri A. ([id number]) Integumentary (Hair, Skin) No obvious rashes seen.. Wound #1 status is Open. Original cause of wound was Pressure Injury. The wound is located on the Right,Lateral Malleolus. The wound measures 0.8cm length x 0.8cm width x 0.1cm depth; 0.503cm^2 area and 0.05cm^3 volume. The wound is limited to skin breakdown. There is no tunneling or undermining noted. There is a large amount of serous drainage noted. The wound margin is distinct with the outline attached to the wound base. There is no granulation within the wound bed. There is a large (67-100%) amount of necrotic tissue within the wound bed including Adherent Slough. The periwound skin appearance exhibited: Localized Edema, Moist, Erythema. The surrounding wound skin color is noted with erythema which is circumferential. Periwound temperature was noted as No Abnormality. The periwound has tenderness on palpation. Wound #2 status is Open. Original cause of wound was Pressure Injury. The wound is located on the Left,Lateral Malleolus. The wound measures 1.3cm length x 2cm width x 0.1cm depth; 2.042cm^2 area and 0.204cm^3 volume. The wound is limited to skin breakdown. There is no tunneling or undermining noted. There is a large amount of serous drainage noted. The wound margin is distinct with the outline attached to the wound base. There is no granulation within the wound bed. There is a  large (67-100%) amount of necrotic tissue within the wound bed including Adherent Slough. The periwound skin appearance exhibited: Localized Edema, Moist, Erythema. The surrounding wound skin color is noted with erythema with red streaks. Periwound temperature was  noted as No Abnormality. The periwound has tenderness on palpation. Wound #3 status is Open. Original cause of wound was Pressure Injury. The wound is located on the Midline Coccyx. The wound measures 12.1cm length x 15.4cm width x 6.2cm depth; 146.351cm^2 area and 907.377cm^3 volume. There is muscle, fat, and fascia exposed. There is no tunneling noted, however, there is undermining starting at 3:00 and ending at 3:00 with a maximum distance of 4cm. There is additional undermining and at 7:00 and ending at 7:00 with a maximum distance of 3.5cm. There is a large amount of serosanguineous drainage noted. The wound margin is distinct with the outline attached to the wound base. There is medium (34-66%) red granulation within the wound bed. There is a medium (34-66%) amount of necrotic tissue within the wound bed including Adherent Slough. The periwound skin appearance exhibited: Localized Edema, Maceration, Moist, Erythema. The surrounding wound skin color is noted with erythema which is circumferential. Periwound temperature was noted as No Abnormality. The periwound has tenderness on palpation. Assessment Active Problems ICD-10 L89.154 - Pressure ulcer of sacral region, stage 4 L89.523 - Pressure ulcer of left ankle, stage 3 L89.513 - Pressure ulcer of right ankle, stage 3 Vasquez, Kerri A. ([id number]) Procedures Wound #1 Wound #1 is a Pressure Ulcer located on the Right,Lateral Malleolus . There was a Skin/Subcutaneous Tissue Debridement [id number] debridement with total area of 0.64 sq cm performed by Ricard Dillon, MD. with the following instrument(s): Curette to remove Viable and Non-Viable tissue/material including Exudate, Fibrin/Slough, and Subcutaneous after achieving pain control using Lidocaine 4% Topical Solution. A time out was conducted at 16:05, prior to the start of the procedure. A Minimum amount of bleeding was controlled with Pressure. The procedure was  tolerated well with a pain level of 0 throughout and a pain level of 0 following the procedure. Post Debridement Measurements: 0.8cm length x 0.8cm width x 0.1cm depth; 0.05cm^3 volume. Post debridement Stage noted as Category/Stage II. Character of Wound/Ulcer Post Debridement requires further debridement. Severity of Tissue Post Debridement is: Fat layer exposed. Post procedure Diagnosis Wound #1: Same as Pre-Procedure Wound #2 Wound #2 is a Pressure Ulcer located on the Left,Lateral Malleolus . There was a Skin/Subcutaneous Tissue Debridement [id number] debridement with total area of 2.6 sq cm performed by Ricard Dillon, MD. with the following instrument(s): Curette to remove Viable and Non-Viable tissue/material including Exudate, Fibrin/Slough, and Subcutaneous after achieving pain control using Lidocaine 4% Topical Solution. A time out was conducted at 16:05, prior to the start of the procedure. A Minimum amount of bleeding was controlled with Pressure. The procedure was tolerated well with a pain level of 0 throughout and a pain level of 0 following the procedure. Post Debridement Measurements: 1.3cm length x 2cm width x 0.2cm depth; 0.408cm^3 volume. Post debridement Stage noted as Category/Stage II. Character of Wound/Ulcer Post Debridement requires further debridement. Severity of Tissue Post Debridement is: Fat layer exposed. Post procedure Diagnosis Wound #2: Same as Pre-Procedure Plan Wound Cleansing: Wound #1 Right,Lateral Malleolus: Clean wound with Normal Saline. Wound #2 Left,Lateral Malleolus: Clean wound with Normal Saline. Wound #3 Midline Coccyx: Clean wound with Normal Saline. Anesthetic: Wound #1 Right,Lateral Malleolus: Topical Lidocaine 4% cream applied to wound bed prior to debridement - for clinic use Wound #2  Left,Lateral Malleolus: Kerri Vasquez, Kerri Vasquez ([id number]) Topical Lidocaine 4% cream applied to wound bed prior to debridement - for clinic  use Wound #3 Midline Coccyx: Topical Lidocaine 4% cream applied to wound bed prior to debridement - for clinic use Skin Barriers/Peri-Wound Care: Wound #1 Right,Lateral Malleolus: Skin Prep Wound #2 Left,Lateral Malleolus: Skin Prep Wound #3 Midline Coccyx: Skin Prep Primary Wound Dressing: Wound #1 Right,Lateral Malleolus: Santyl Ointment Wound #2 Left,Lateral Malleolus: Santyl Ointment Wound #3 Midline Coccyx: Saline moistened gauze - pack light into wound Hydrogel Secondary Dressing: Wound #1 Right,Lateral Malleolus: Dry Gauze Boardered Foam Dressing Wound #2 Left,Lateral Malleolus: Dry Gauze Boardered Foam Dressing Wound #3 Midline Coccyx: ABD pad Dry Gauze XtraSorb Dressing Change Frequency: Wound #1 Right,Lateral Malleolus: Change dressing every day. Wound #2 Left,Lateral Malleolus: Change dressing every day. Wound #3 Midline Coccyx: Change dressing every day. - unless soiled Follow-up Appointments: Wound #1 Right,Lateral Malleolus: Return Appointment in 2 weeks. Wound #2 Left,Lateral Malleolus: Return Appointment in 2 weeks. Wound #3 Midline Coccyx: Return Appointment in 2 weeks. Off-Loading: Wound #1 Right,Lateral Malleolus: Turn and reposition every 2 hours Other: - float heels when in the bed Wound #2 Left,Lateral Malleolus: Turn and reposition every 2 hours Other: - float heels when in the bed Wound #3 Midline Coccyx: Kerri Vasquez, WILTFONG A. ([id number]) Turn and reposition every 2 hours Other: - float heels when in the bed Additional Orders / Instructions: Wound #1 Right,Lateral Malleolus: Increase protein intake. Wound #2 Left,Lateral Malleolus: Increase protein intake. Wound #3 Midline Coccyx: Increase protein intake. Medications-please add to medication list.: Wound #1 Right,Lateral Malleolus: Other: - Vitamin C, Zinc, Multivitamin Wound #2 Left,Lateral Malleolus: Other: - Vitamin C, Zinc, Multivitamin Wound #3 Midline Coccyx: Other:  - Vitamin C, Zinc, Multivitamin #1 very large sacral decubitus ulcers surrounding and of the bilateral buttocks. This went for an operative debridement during a hospitalization in November. Cultures at the time grew Proteus and MRSA. She received antibiotics. I don't see any direct imaging of the bone underneath this. I think the facility's using a version of wet to dry dressings right now and to be truthful I don't think there is any alternative. This is to close to her anal opening to consider negative pressure wound therapy. Think the wound would need to get smaller even to consider plastic surgery referral. #2 pressure ulcers over her bilateral lateral malleolus left greater than right. Both of these are stage III areas. They're using Santyl at these of the facility I agree with this #3 she apparently has some form of low air loss mattress although the patient does not find a comfortable. #4 the patient does not have antigravity strength in either leg possible weakness greater than distal. She's had a complete set of MRIs of her spinal cord and her brain without obvious cause. I wonder whether she simply might have had a prolonged disuse situation although I wasn't really able to get this out of the patient Electronic Signature(s) Signed: 04/04/2016 5:08:56 PM By: Linton Ham MD Entered By: Linton Ham on 04/04/2016 16:47:05 Coralyn Mark ([id number]) -------------------------------------------------------------------------------- ROS/PFSH Details Marella Bile, Date of Service: 04/04/2016 3:00 PM Patient Name: Kerri Vasquez Patient Account Number: [id number] Medical Record Treating RN: Ahmed Prima [id number] Number: Other Clinician: Date of Birth/Sex: February 23, 1956 (60 y.o. 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 ([id number]) Respiratory Complaints and Symptoms: No Complaints or Symptoms Cardiovascular Complaints and Symptoms: No Complaints or Symptoms Medical History: Positive for: Congestive Heart Failure Gastrointestinal Complaints and Symptoms: No Complaints or Symptoms Endocrine Complaints and Symptoms: No Complaints or Symptoms Genitourinary Complaints and Symptoms: No Complaints or Symptoms Immunological Complaints and Symptoms: No Complaints or Symptoms Integumentary (Skin) Complaints and Symptoms: No Complaints or Symptoms Musculoskeletal Complaints and Symptoms: No Complaints or Symptoms Neurologic Complaints and Symptoms: No Complaints or Symptoms Medical History: Positive for: Neuropathy Oncologic KEZIAH, BARASCH ([id number]) Complaints and Symptoms: No Complaints or Symptoms Medical History: Positive for: Received Chemotherapy Negative for: Received Radiation Past  Medical History Notes: breast cancer - bilateral mastectomy - 9 years ago Immunizations Pneumococcal Vaccine: Received Pneumococcal Vaccination: No Immunization Notes: up to date Family and Social History Never smoker; Marital Status - Divorced; Alcohol Use: Never; Drug Use: No History; Caffeine Use: Never; Financial Concerns: No; Food, Clothing or Shelter Needs: No; Support System Lacking: No; Transportation Concerns: No; Advanced Directives: Yes; Medical Power of Attorney: Yes - Baltazar Najjar) Signed: 04/04/2016 4:57:07 PM By: Alric Quan Signed: 04/04/2016 5:08:56 PM By: Linton Ham MD Entered By: Alric Quan on 04/04/2016 15:53:45 Coralyn Mark ([id number]) -------------------------------------------------------------------------------- Jefferey Pica, Date of Service: 04/04/2016 Patient Name: Kerri Vasquez Patient Account Number: [id number] Medical Record Treating RN: [id number] Number: Other Clinician: Date of Birth/Sex: 1956/02/28 (60 y.o. Female) Treating ROBSON, Primary Care Physician/Extender: Mathis Fare, [place] Physician: Suella [place] in Treatment: 0 Referring Physician: Wenda Low Diagnosis Coding ICD-10 Codes Code Description L89.154 Pressure ulcer of sacral region, stage 4 L89.523 Pressure ulcer of left ankle, stage 3 L89.513 Pressure ulcer of right ankle, stage 3 Facility Procedures CPT4 Code: [id number] Description: [place] VISIT-LEV 4 EST PT Modifier: Quantity: 1 CPT4 Code: [id number] Description: [id number] - DEB SUBQ TISSUE 20 SQ CM/< ICD-10 Description Diagnosis L89.523 Pressure ulcer of left ankle, stage 3 L89.513 Pressure ulcer of right ankle, stage 3 Modifier: Quantity: 1 Physician Procedures CPT4 Code: [id number] Description: [id number] - WC PHYS LEVEL 4 - NEW PT ICD-10 Description Diagnosis L89.154 Pressure ulcer of sacral region, stage 4 L89.523 Pressure ulcer of left ankle, stage 3 L89.513  Pressure ulcer of right ankle, stage 3 Modifier: Quantity: 1 CPT4 Code: [id number] MARTINEZ-MAHJOUB Description: [id number] - WC PHYS SUBQ TISS 20 SQ CM ICD-10 Description Diagnosis L89.523 Pressure ulcer of left ankle, stage 3 L89.513 Pressure ulcer of right ankle, stage 3 A, Chablis A. ([id number]) Modifier: Quantity: 1 Electronic Signature(s) Signed: 04/04/2016 4:57:07 PM By: Alric Quan Signed: 04/04/2016 5:08:56 PM By: Linton Ham MD Entered By: Alric Quan on 04/04/2016 16:51:47

## 2016-04-05 NOTE — Progress Notes (Signed)
Kerri Vasquez, Kerri Vasquez ([id number]) Visit Report for 04/04/2016 Allergy List Details Kerri Vasquez, Date of Service: 04/04/2016 3:00 PM Patient Name: Kerri Vasquez Patient Account Number: [id number] Medical Record Treating RN: Ahmed Prima [id number] Number: Other Clinician: Date of Birth/Sex: [birth year] (60 y.o. Female) Treating ROBSON, MICHAEL Primary Care Physician/Extender: Athena Masse, [place] Physician: Referring Physician: Wenda Low Weeks in Treatment: 0 Allergies Active Allergies influenza virus vacc trivalent, whole pneumococcal vaccine Ritalin Zostavax (PF) adhesive tape Allergy Notes Electronic Signature(s) Signed: 04/04/2016 4:57:07 PM By: Alric Quan Entered By: Alric Quan on 04/04/2016 15:47:07 Kerri Vasquez ([id number]) -------------------------------------------------------------------------------- Arrival Information Details Kerri Vasquez, Date of Service: 04/04/2016 3:00 PM Patient Name: Kerri Vasquez Patient Account Number: [id number] Medical Record Treating RN: Ahmed Prima [id number] Number: Other Clinician: Date of Birth/Sex: 02/26/1956 (60 y.o. Female) Treating ROBSON, MICHAEL Primary Care Physician/Extender: Athena Masse, [place] Physician: Referring Physician: Andres Labrum in Treatment: 0 Visit Information Patient Arrived: Stretcher Arrival Time: 15:05 Accompanied By: caregiver, EMS Transfer Assistance: Stretcher Patient Identification Verified: Yes Secondary Verification Process Yes Completed: Patient Requires Transmission-Based No Precautions: Patient Has Alerts: No Electronic Signature(s) Signed: 04/04/2016 4:57:07 PM By: Alric Quan Entered By: Alric Quan on 04/04/2016 15:05:23 Kerri Vasquez ([id number]) -------------------------------------------------------------------------------- Clinic Level of Care Assessment Details [place], Date of Service: 04/04/2016 3:00 PM Patient  Name: Kerri Vasquez Patient Account Number: [id number] Medical Record Treating RN: Ahmed Prima [id number] Number: Other Clinician: Date of Birth/Sex: 04-12-1956 (60 y.o. Female) Treating ROBSON, MICHAEL Primary Care Physician/Extender: Athena Masse, [place] Physician: Referring Physician: Andres Labrum in Treatment: 0 Clinic Level of Care Assessment Items TOOL 1 Quantity Score X - Use when EandM and Procedure is performed on INITIAL visit 1 0 ASSESSMENTS - Nursing Assessment / Reassessment X - General Physical Exam (combine w/ comprehensive assessment (listed just 1 20 below) when performed on new pt. evals) X - Comprehensive Assessment (HX, ROS, Risk Assessments, Wounds Hx, etc.) 1 25 ASSESSMENTS - Wound and Skin Assessment / Reassessment []  - Dermatologic / Skin Assessment (not related to wound area) 0 ASSESSMENTS - Ostomy and/or Continence Assessment and Care X - Incontinence Assessment and Management 1 10 []  - Ostomy Care Assessment and Management (repouching, etc.) 0 PROCESS - Coordination of Care []  - Simple Patient / Family Education for ongoing care 0 X - Complex (extensive) Patient / Family Education for ongoing care 1 20 X - Staff obtains Programmer, systems, Records, Test Results / Process Orders 1 10 X - Staff telephones HHA, Nursing Homes / Clarify orders / etc 1 10 []  - Routine Transfer to another Facility (non-emergent condition) 0 []  - Routine Hospital Admission (non-emergent condition) 0 X - New Admissions / Biomedical engineer / Ordering NPWT, Apligraf, etc. 1 15 []  - Emergency Hospital Admission (emergent condition) 0 PROCESS - Special Needs []  - Pediatric / Minor Patient Management 0 Kerri Vasquez, Kerri A. ([id number]) []  - Isolation Patient Management 0 []  - Hearing / Language / Visual special needs 0 []  - Assessment of Community assistance (transportation, D/C planning, etc.) 0 []  - Additional assistance / Altered mentation 0 []  - Support Surface(s) Assessment  (bed, cushion, seat, etc.) 0 INTERVENTIONS - Miscellaneous []  - External ear exam 0 X - Patient Transfer (multiple staff / Civil Service fast streamer / Similar devices) 1 10 []  - Simple Staple / Suture removal (25 or less) 0 []  - Complex Staple / Suture removal (26 or more) 0 []  - Hypo/Hyperglycemic Management (do not check if billed separately) 0 X - Ankle / Brachial Index (ABI) - do not check if billed separately  1 15 Has the patient been seen at the hospital within the last three years: Yes Total Score: 135 Level Of Care: New/Established - Level 4 Electronic Signature(s) Signed: 04/04/2016 4:57:07 PM By: Alric Quan Entered By: Alric Quan on 04/04/2016 16:51:39 Kerri Vasquez ([id number]) -------------------------------------------------------------------------------- Encounter Discharge Information Details Kerri Vasquez, Date of Service: 04/04/2016 3:00 PM Patient Name: Kerri Vasquez Patient Account Number: [id number] Medical Record Treating RN: Ahmed Prima [id number] Number: Other Clinician: Date of Birth/Sex: 10-21-1955 (60 y.o. Female) Treating ROBSON, MICHAEL Primary Care Physician/Extender: Athena Masse, [place] Physician: Referring Physician: Andres Labrum in Treatment: 0 Encounter Discharge Information Items Discharge Pain Level: 4 Discharge Condition: Stable Ambulatory Status: Stretcher Discharge Destination: Nursing Home Transportation: Ambulance Accompanied By: EMS Schedule Follow-up Appointment: Yes Medication Reconciliation completed and provided to Patient/Care No Sly Parlee: Provided on Clinical Summary of Care: 04/04/2016 Form Type Recipient Paper Patient MM Electronic Signature(s) Signed: 04/04/2016 4:39:12 PM By: Ruthine Dose Entered By: Ruthine Dose on 04/04/2016 16:39:12 Kerri Vasquez ([id number]) -------------------------------------------------------------------------------- Lower Extremity Assessment Details Kerri Vasquez,  Date of Service: 04/04/2016 3:00 PM Patient Name: Kerri Vasquez Patient Account Number: [id number] Medical Record Treating RN: Ahmed Prima [id number] Number: Other Clinician: Date of Birth/Sex: November 02, 1955 (60 y.o. Female) Treating ROBSON, MICHAEL Primary Care Physician/Extender: Athena Masse, [place] Physician: Referring Physician: Wenda Low Weeks in Treatment: 0 Edema Assessment Assessed: [Left: No] [Right: No] Edema: [Left: Yes] [Right: Yes] Calf Left: Right: Point of Measurement: 30 cm From Medial Instep 41.6 cm 42 cm Ankle Left: Right: Point of Measurement: 12 cm From Medial Instep 26.2 cm 27 cm Vascular Assessment Pulses: Posterior Tibial Palpable: [Left:No] [Right:No] Doppler: [Left:Multiphasic] [Right:Multiphasic] Dorsalis Pedis Palpable: [Left:No] [Right:No] Doppler: [Left:Multiphasic] [Right:Multiphasic] Extremity colors, hair growth, and conditions: Extremity Color: [Left:Normal] [Right:Normal] Hair Growth on Extremity: [Left:Yes] [Right:Yes] Temperature of Extremity: [Left:Warm] [Right:Warm] Capillary Refill: [Left:< 3 seconds] [Right:< 3 seconds] Blood Pressure: Brachial: [Left:110] [Right:110] Dorsalis Pedis: 120 [Left:Dorsalis Pedis: 122] Ankle: Posterior Tibial: [Left:Posterior Tibial: 1.09] [Right:1.11] Toe Nail Assessment Left: Right: Thick: No No Kerri Vasquez, Kerri A. ([id number]) Discolored: No No Deformed: No No Improper Length and Hygiene: No No Electronic Signature(s) Signed: 04/04/2016 4:57:07 PM By: Alric Quan Entered By: Alric Quan on 04/04/2016 15:25:19 Kerri Vasquez ([id number]) -------------------------------------------------------------------------------- Multi Wound Chart Details Kerri Vasquez, Date of Service: 04/04/2016 3:00 PM Patient Name: Kerri Vasquez Patient Account Number: [id number] Medical Record Treating RN: Ahmed Prima [id number] Number: Other Clinician: Date of Birth/Sex: 1955/08/15 (60 y.o.  Female) Treating ROBSON, MICHAEL Primary Care Physician/Extender: Athena Masse, [place] Physician: Referring Physician: Wenda Low Weeks in Treatment: 0 Vital Signs Height(in): 66 Pulse(bpm): 87 Weight(lbs): Blood Pressure 104/56 (mmHg): Body Mass Index(BMI): Temperature(F): 97.8 Respiratory Rate 16 (breaths/min): Photos: [1:No Photos] [2:No Photos] [3:No Photos] Wound Location: [1:Right Malleolus - Lateral] [2:Left Malleolus - Lateral] [3:Coccyx - Midline] Wounding Event: [1:Pressure Injury] [2:Pressure Injury] [3:Pressure Injury] Primary Etiology: [1:Pressure Ulcer] [2:Pressure Ulcer] [3:Pressure Ulcer] Date Acquired: [1:02/03/2016] [2:02/03/2016] [3:12/04/2015] Weeks of Treatment: [1:0] [2:0] [3:0] Wound Status: [1:Open] [2:Open] [3:Open] Measurements L x W x D 0.8x0.8x0.1 [2:1.3x2x0.1] [3:12.1x15.4x6.2] (cm) Area (cm) : [1:0.503] [2:2.042] [3:146.351] Volume (cm) : [1:0.05] [2:0.204] [3:907.377] % Reduction in Area: [1:N/A] [2:N/A] [3:0.00%] % Reduction in Volume: N/A [2:N/A] [3:0.00%] Starting Position 1 [3:3] (o'clock): Ending Position 1 [3:3] (o'clock): Maximum Distance 1 [3:4] (cm): Starting Position 2 [3:7] (o'clock): Ending Position 2 [3:7] (o'clock): Maximum Distance 2 [3:3.5] (cm): Undermining: [1:No] [2:No] [3:Yes] Classification: [1:Category/Stage II] [2:Category/Stage II] [3:Category/Stage IV] Exudate Amount: Large Large Large Exudate Type: Serous Serous Serosanguineous Exudate Color: amber amber red, brown Foul  Odor After No Yes No Cleansing: Odor Anticipated Due to N/A No N/A Product Use: Wound Margin: Distinct, outline attached Distinct, outline attached Distinct, outline attached Granulation Amount: None Present (0%) None Present (0%) Medium (34-66%) Granulation Quality: N/A N/A Red Necrotic Amount: Large (67-100%) Large (67-100%) Medium (34-66%) Exposed Structures: Fascia: No Fascia: No Fascia: Yes Fat: No Fat: No Fat: Yes Tendon:  No Tendon: No Muscle: Yes Muscle: No Muscle: No Joint: No Joint: No Bone: No Bone: No Limited to Skin Limited to Skin Breakdown Breakdown Epithelialization: None None None Periwound Skin Texture: Edema: Yes Edema: Yes Edema: Yes Periwound Skin Moist: Yes Moist: Yes Maceration: Yes Moisture: Moist: Yes Periwound Skin Color: Erythema: Yes Erythema: Yes Erythema: Yes Erythema Location: Circumferential [place] Circumferential Temperature: No Abnormality No Abnormality No Abnormality Tenderness on Yes Yes Yes Palpation: Wound Preparation: Ulcer Cleansing: Ulcer Cleansing: Ulcer Cleansing: Rinsed/Irrigated with Rinsed/Irrigated with Rinsed/Irrigated with Saline Saline Saline Topical Anesthetic Topical Anesthetic Topical Anesthetic Applied: Other: lidocaine Applied: Other: lidocaine Applied: Other: lidocaine 4% 4% 4% Treatment Notes Electronic Signature(s) Signed: 04/04/2016 4:57:07 PM By: Alric Quan Entered By: Alric Quan on 04/04/2016 15:55:02 Kerri Vasquez ([id number]) -------------------------------------------------------------------------------- Multi-Disciplinary Care Plan Details Kerri Vasquez, Date of Service: 04/04/2016 3:00 PM Patient Name: Kerri Vasquez Patient Account Number: [id number] Medical Record Treating RN: Ahmed Prima [id number] Number: Other Clinician: Date of Birth/Sex: 05/09/1955 (60 y.o. Female) Treating ROBSON, MICHAEL Primary Care Physician/Extender: Athena Masse, [place] Physician: Referring Physician: Andres Labrum in Treatment: 0 Active Inactive Abuse / Safety / Falls / Self Care Management Nursing Diagnoses: Impaired physical mobility Goals: Patient will remain injury free Date Initiated: 04/04/2016 Goal Status: Active Interventions: Assess fall risk on admission and as needed Notes: Orientation to the Wound Care Program Nursing Diagnoses: Knowledge deficit related to the wound healing center  program Goals: Patient/caregiver will verbalize understanding of the [facility] Date Initiated: 04/04/2016 Goal Status: Active Interventions: Provide education on orientation to the wound center Notes: Pressure Nursing Diagnoses: Knowledge deficit related to causes and risk factors for pressure ulcer development Kerri Vasquez, Kerri A. ([id number]) Goals: Patient will remain free from development of additional pressure ulcers Date Initiated: 04/04/2016 Goal Status: Active Interventions: Assess: immobility, friction, shearing, incontinence upon admission and as needed Notes: Wound/Skin Impairment Nursing Diagnoses: Impaired tissue integrity Goals: Patient/caregiver will verbalize understanding of skin care regimen Date Initiated: 04/04/2016 Goal Status: Active Ulcer/skin breakdown will have a volume reduction of 30% by week 4 Date Initiated: 04/04/2016 Goal Status: Active Ulcer/skin breakdown will have a volume reduction of 50% by week 8 Date Initiated: 04/04/2016 Goal Status: Active Ulcer/skin breakdown will have a volume reduction of 80% by week 12 Date Initiated: 04/04/2016 Goal Status: Active Ulcer/skin breakdown will heal within 14 weeks Date Initiated: 04/04/2016 Goal Status: Active Interventions: Assess patient/caregiver ability to obtain necessary supplies Assess patient/caregiver ability to perform ulcer/skin care regimen upon admission and as needed Assess ulceration(s) every visit Notes: Electronic Signature(s) Signed: 04/04/2016 4:57:07 PM By: Alric Quan Entered By: Alric Quan on 04/04/2016 15:54:46 Kerri Vasquez ([id number]) -------------------------------------------------------------------------------- Pain Assessment Details Kerri Vasquez, Date of Service: 04/04/2016 3:00 PM Patient Name: Kerri Vasquez Patient Account Number: [id number] Medical Record Treating RN: Ahmed Prima [id number] Number: Other  Clinician: Date of Birth/Sex: 09-22-1955 (60 y.o. Female) Treating ROBSON, MICHAEL Primary Care Physician/Extender: Athena Masse, [place] Physician: Referring Physician: Wenda Low Weeks in Treatment: 0 Active Problems Location of Pain Severity and Description of Pain Patient Has Paino Yes Site Locations Pain Location: Pain in Ulcers With Dressing Change: Yes  Duration of the Pain. Constant / Intermittento Constant Rate the pain. Current Pain Level: 8 Worst Pain Level: 10 Least Pain Level: 4 Character of Pain Describe the Pain: Aching, Burning, Tender, Throbbing Pain Management and Medication Current Pain Management: Electronic Signature(s) Signed: 04/04/2016 4:57:07 PM By: Alric Quan Entered By: Alric Quan on 04/04/2016 15:05:51 Kerri Vasquez ([id number]) -------------------------------------------------------------------------------- Patient/Caregiver Education Details Kerri Vasquez, Date of Service: 04/04/2016 3:00 PM Patient Name: Kerri Vasquez Patient Account Number: [id number] Medical Record Treating RN: Ahmed Prima [id number] Number: Other Clinician: Date of Birth/Gender: 05/24/1955 (60 y.o. Female) Treating ROBSON, MICHAEL Primary Care Physician/Extender: Renella [place]un[place] Physician: [place] in Treatment: 0 Referring Physician: Wenda Low Education Assessment Education Provided To: Patient Education Topics Provided Welcome To [facility] [place]: Handouts: Welcome To [facility] [place] Methods: Explain/Verbal Responses: State content correctly Wound/Skin Impairment: Handouts: Other: change dressing as ordered Methods: Demonstration, Explain/Verbal Responses: State content correctly Electronic Signature(s) Signed: 04/04/2016 4:57:07 PM By: Alric Quan Entered By: Alric Quan on 04/04/2016 16:12:47 Kerri Vasquez ([id number]) -------------------------------------------------------------------------------- Wound  Assessment Details Kerri Vasquez, Date of Service: 04/04/2016 3:00 PM Patient Name: Kerri Vasquez Patient Account Number: [id number] Medical Record Treating RN: Ahmed Prima [id number] Number: Other Clinician: Date of Birth/Sex: May 29, 1955 (60 y.o. Female) Treating ROBSON, MICHAEL Primary Care Physician/Extender: Athena Masse, [place] Physician: Referring Physician: Wenda Low Weeks in Treatment: 0 Wound Status Wound Number: 1 Primary Etiology: Pressure Ulcer Wound Location: Right Malleolus - Lateral Wound Status: Open Wounding Event: Pressure Injury Date Acquired: 02/03/2016 Weeks Of Treatment: 0 Clustered Wound: No Photos Photo Uploaded By: Alric Quan on 04/04/2016 16:49:52 Wound Measurements Length: (cm) 0.8 Width: (cm) 0.8 Depth: (cm) 0.1 Area: (cm) 0.503 Volume: (cm) 0.05 % Reduction in Area: % Reduction in Volume: Epithelialization: None Tunneling: No Undermining: No Wound Description Classification: Category/Stage II Wound Margin: Distinct, outline attached Exudate Amount: Large Exudate Type: Serous Exudate Color: amber Foul Odor After Cleansing: No Wound Bed Granulation Amount: None Present (0%) Exposed Structure Necrotic Amount: Large (67-100%) Fascia Exposed: No Kerri Vasquez, Kerri A. ([id number]) Necrotic Quality: Adherent Slough Fat Layer Exposed: No Tendon Exposed: No Muscle Exposed: No Joint Exposed: No Bone Exposed: No Limited to Skin Breakdown Periwound Skin Texture Texture Color No Abnormalities Noted: No No Abnormalities Noted: No Localized Edema: Yes Erythema: Yes Erythema Location: Circumferential Moisture No Abnormalities Noted: No Temperature / Pain Moist: Yes Temperature: No Abnormality Tenderness on Palpation: Yes Wound Preparation Ulcer Cleansing: Rinsed/Irrigated with Saline Topical Anesthetic Applied: Other: lidocaine 4%, Treatment Notes Wound #1 (Right, Lateral Malleolus) 1. Cleansed with: Clean wound with  Normal Saline 2. Anesthetic Topical Lidocaine 4% cream to wound bed prior to debridement 3. Peri-wound Care: Skin Prep 4. Dressing Applied: Santyl Ointment 5. Secondary Dressing Applied Bordered Foam Dressing Dry Gauze Electronic Signature(s) Signed: 04/04/2016 4:57:07 PM By: Alric Quan Entered By: Alric Quan on 04/04/2016 15:29:26 Kerri Vasquez ([id number]) -------------------------------------------------------------------------------- Wound Assessment Details Kerri Vasquez, Date of Service: 04/04/2016 3:00 PM Patient Name: Kerri Vasquez Patient Account Number: [id number] Medical Record Treating RN: Ahmed Prima [id number] Number: Other Clinician: Date of Birth/Sex: [birth year] (60 y.o. Female) Treating ROBSON, MICHAEL Primary Care Physician/Extender: Athena Masse, [place] Physician: Referring Physician: Wenda Low Weeks in Treatment: 0 Wound Status Wound Number: 2 Primary Etiology: Pressure Ulcer Wound Location: Left Malleolus - Lateral Wound Status: Open Wounding Event: Pressure Injury Date Acquired: 02/03/2016 Weeks Of Treatment: 0 Clustered Wound: No Photos Photo Uploaded By: Alric Quan on 04/04/2016 16:49:53 Wound Measurements Length: (cm) 1.3 Width: (cm) 2 Depth: (cm) 0.1 Area: (cm) 2.042 Volume: (  cm) 0.204 % Reduction in Area: % Reduction in Volume: Epithelialization: None Tunneling: No Undermining: No Wound Description Classification: Category/Stage II Wound Margin: Distinct, outline attached Exudate Amount: Large Exudate Type: Serous Exudate Color: amber Foul Odor After Cleansing: Yes Due to Product Use: No Wound Bed Granulation Amount: None Present (0%) Exposed Structure Necrotic Amount: Large (67-100%) Fascia Exposed: No Kerri Vasquez, Angle A. ([id number]) Necrotic Quality: Adherent Slough Fat Layer Exposed: No Tendon Exposed: No Muscle Exposed: No Joint Exposed: No Bone Exposed: No Limited to Skin  Breakdown Periwound Skin Texture Texture Color No Abnormalities Noted: No No Abnormalities Noted: No Localized Edema: Yes Erythema: Yes Erythema Location: Red Streaks Moisture No Abnormalities Noted: No Temperature / Pain Moist: Yes Temperature: No Abnormality Tenderness on Palpation: Yes Wound Preparation Ulcer Cleansing: Rinsed/Irrigated with Saline Topical Anesthetic Applied: Other: lidocaine 4%, Treatment Notes Wound #2 (Left, Lateral Malleolus) 1. Cleansed with: Clean wound with Normal Saline 2. Anesthetic Topical Lidocaine 4% cream to wound bed prior to debridement 3. Peri-wound Care: Skin Prep 4. Dressing Applied: Santyl Ointment 5. Secondary Dressing Applied Bordered Foam Dressing Dry Gauze Electronic Signature(s) Signed: 04/04/2016 4:57:07 PM By: Alric Quan Entered By: Alric Quan on 04/04/2016 15:32:24 Kerri Vasquez ([id number]) -------------------------------------------------------------------------------- Wound Assessment Details Kerri Vasquez, Date of Service: 04/04/2016 3:00 PM Patient Name: Kerri Vasquez Patient Account Number: [id number] Medical Record Treating RN: Ahmed Prima [id number] Number: Other Clinician: Date of Birth/Sex: 27-Oct-1955 (60 y.o. Female) Treating ROBSON, MICHAEL Primary Care Physician/Extender: Athena Masse, [place] Physician: Referring Physician: Wenda Low Weeks in Treatment: 0 Wound Status Wound Number: 3 Primary Etiology: Pressure Ulcer Wound Location: Coccyx - Midline Wound Status: Open Wounding Event: Pressure Injury Date Acquired: 12/04/2015 Weeks Of Treatment: 0 Clustered Wound: No Photos Photo Uploaded By: Alric Quan on 04/04/2016 16:50:08 Wound Measurements Length: (cm) 12.1 % Reduction i Width: (cm) 15.4 % Reduction i Depth: (cm) 6.2 Epithelializa Area: (cm) 146.351 Tunneling: Volume: (cm) 907.377 Undermining: Location 1 Startin Ending Maximum Location  2 Startin Ending Maximum n Area: 0% n Volume: 0% tion: None No Yes g Position (o'clock): 3 Position (o'clock): 3 Distance: (cm) 4 g Position (o'clock): 7 Position (o'clock): 7 Distance: (cm) 3.5 Wound Description Kerri Vasquez, Kerri A. ([id number]) Classification: Category/Stage IV Foul Odor After Cleansing: No Wound Margin: Distinct, outline attached Exudate Amount: Large Exudate Type: Serosanguineous Exudate Color: red, brown Wound Bed Granulation Amount: Medium (34-66%) Exposed Structure Granulation Quality: Red Fascia Exposed: Yes Necrotic Amount: Medium (34-66%) Fat Layer Exposed: Yes Necrotic Quality: Adherent Slough Muscle Exposed: Yes Necrosis of Muscle: No Periwound Skin Texture Texture Color No Abnormalities Noted: No No Abnormalities Noted: No Localized Edema: Yes Erythema: Yes Erythema Location: Circumferential Moisture No Abnormalities Noted: No Temperature / Pain Maceration: Yes Temperature: No Abnormality Moist: Yes Tenderness on Palpation: Yes Wound Preparation Ulcer Cleansing: Rinsed/Irrigated with Saline Topical Anesthetic Applied: Other: lidocaine 4%, Treatment Notes Wound #3 (Midline Coccyx) 1. Cleansed with: Clean wound with Normal Saline 2. Anesthetic Topical Lidocaine 4% cream to wound bed prior to debridement 3. Peri-wound Care: Skin Prep 4. Dressing Applied: Hydrogel Saline moistened guaze 5. Secondary Dressing Applied ABD Pad Dry Gauze 7. Secured with Tape Notes Manufacturing systems engineer) Signed: 04/04/2016 4:57:07 PM By: Lura Em, Kerri Vasquez ([id number]) Entered By: Alric Quan on 04/04/2016 15:45:24 Kerri Vasquez ([id number]) -------------------------------------------------------------------------------- Vitals Details Kerri Vasquez, Date of Service: 04/04/2016 3:00 PM Patient Name: Kerri Vasquez Patient Account Number: [id number] Medical Record Treating RN: Ahmed Prima [id number] Number: Other Clinician: Date of Birth/Sex: 07/03/1955 (60 y.o. Female) Treating ROBSON, MICHAEL Primary  Care Physician/Extender: Athena Masse, [place] Physician: Referring Physician: Andres Labrum in Treatment: 0 Vital Signs Time Taken: 15:05 Temperature (F): 97.8 Height (in): 66 Pulse (bpm): 87 Source: Stated Respiratory Rate (breaths/min): 16 Blood Pressure (mmHg): 104/56 Reference Range: 80 - 120 mg / dl Electronic Signature(s) Signed: 04/04/2016 4:57:07 PM By: Alric Quan Entered By: Alric Quan on 04/04/2016 15:10:10

## 2016-04-06 DIAGNOSIS — I509 Heart failure, unspecified: Secondary | ICD-10-CM | POA: Diagnosis not present

## 2016-04-06 DIAGNOSIS — E039 Hypothyroidism, unspecified: Secondary | ICD-10-CM | POA: Diagnosis not present

## 2016-04-06 DIAGNOSIS — C50919 Malignant neoplasm of unspecified site of unspecified female breast: Secondary | ICD-10-CM | POA: Diagnosis not present

## 2016-04-06 DIAGNOSIS — L97919 Non-pressure chronic ulcer of unspecified part of right lower leg with unspecified severity: Secondary | ICD-10-CM | POA: Diagnosis not present

## 2016-04-06 DIAGNOSIS — I10 Essential (primary) hypertension: Secondary | ICD-10-CM | POA: Diagnosis not present

## 2016-04-06 DIAGNOSIS — G8929 Other chronic pain: Secondary | ICD-10-CM | POA: Diagnosis not present

## 2016-04-06 DIAGNOSIS — L89154 Pressure ulcer of sacral region, stage 4: Secondary | ICD-10-CM | POA: Diagnosis not present

## 2016-04-08 ENCOUNTER — Other Ambulatory Visit: Payer: Self-pay | Admitting: Hematology & Oncology

## 2016-04-13 ENCOUNTER — Other Ambulatory Visit: Payer: Self-pay | Admitting: Hematology & Oncology

## 2016-04-18 ENCOUNTER — Ambulatory Visit: Payer: Self-pay | Admitting: Internal Medicine

## 2016-05-02 DIAGNOSIS — L89520 Pressure ulcer of left ankle, unstageable: Secondary | ICD-10-CM | POA: Diagnosis not present

## 2016-05-02 DIAGNOSIS — L89510 Pressure ulcer of right ankle, unstageable: Secondary | ICD-10-CM | POA: Diagnosis not present

## 2016-05-02 DIAGNOSIS — I5032 Chronic diastolic (congestive) heart failure: Secondary | ICD-10-CM | POA: Diagnosis not present

## 2016-05-02 DIAGNOSIS — L89154 Pressure ulcer of sacral region, stage 4: Secondary | ICD-10-CM | POA: Diagnosis not present

## 2016-05-03 ENCOUNTER — Encounter: Payer: Self-pay | Admitting: Interventional Radiology

## 2016-05-04 DIAGNOSIS — N390 Urinary tract infection, site not specified: Secondary | ICD-10-CM | POA: Diagnosis not present

## 2016-05-04 DIAGNOSIS — L89154 Pressure ulcer of sacral region, stage 4: Secondary | ICD-10-CM | POA: Diagnosis not present

## 2016-05-04 DIAGNOSIS — Z9289 Personal history of other medical treatment: Secondary | ICD-10-CM | POA: Diagnosis not present

## 2016-05-09 DIAGNOSIS — L89510 Pressure ulcer of right ankle, unstageable: Secondary | ICD-10-CM | POA: Diagnosis not present

## 2016-05-09 DIAGNOSIS — L89520 Pressure ulcer of left ankle, unstageable: Secondary | ICD-10-CM | POA: Diagnosis not present

## 2016-05-09 DIAGNOSIS — L89154 Pressure ulcer of sacral region, stage 4: Secondary | ICD-10-CM | POA: Diagnosis not present

## 2016-05-16 DIAGNOSIS — L89154 Pressure ulcer of sacral region, stage 4: Secondary | ICD-10-CM | POA: Diagnosis not present

## 2016-05-16 DIAGNOSIS — L89520 Pressure ulcer of left ankle, unstageable: Secondary | ICD-10-CM | POA: Diagnosis not present

## 2016-05-19 DIAGNOSIS — I509 Heart failure, unspecified: Secondary | ICD-10-CM | POA: Diagnosis not present

## 2016-05-19 DIAGNOSIS — C50919 Malignant neoplasm of unspecified site of unspecified female breast: Secondary | ICD-10-CM | POA: Diagnosis not present

## 2016-05-19 DIAGNOSIS — G8929 Other chronic pain: Secondary | ICD-10-CM | POA: Diagnosis not present

## 2016-05-19 DIAGNOSIS — I10 Essential (primary) hypertension: Secondary | ICD-10-CM | POA: Diagnosis not present

## 2016-05-19 DIAGNOSIS — L89154 Pressure ulcer of sacral region, stage 4: Secondary | ICD-10-CM | POA: Diagnosis not present

## 2016-05-19 DIAGNOSIS — E039 Hypothyroidism, unspecified: Secondary | ICD-10-CM | POA: Diagnosis not present

## 2016-05-23 ENCOUNTER — Telehealth: Payer: Self-pay | Admitting: Radiology

## 2016-05-23 DIAGNOSIS — I10 Essential (primary) hypertension: Secondary | ICD-10-CM | POA: Diagnosis not present

## 2016-05-23 DIAGNOSIS — L89523 Pressure ulcer of left ankle, stage 3: Secondary | ICD-10-CM | POA: Diagnosis not present

## 2016-05-23 DIAGNOSIS — I5032 Chronic diastolic (congestive) heart failure: Secondary | ICD-10-CM | POA: Diagnosis not present

## 2016-05-23 DIAGNOSIS — L89154 Pressure ulcer of sacral region, stage 4: Secondary | ICD-10-CM | POA: Diagnosis not present

## 2016-05-23 NOTE — Telephone Encounter (Signed)
Spoke w/ patient's daugher, [name].  She states that patient is currently at [facility] in [place].  She prefers to schedule IR follow up for March.    Our office will call patient when March schedule available.  Christalynn Boise Riki Rusk, RN 05/23/2016 3:49 PM

## 2016-05-30 ENCOUNTER — Other Ambulatory Visit (INDEPENDENT_AMBULATORY_CARE_PROVIDER_SITE_OTHER): Payer: Self-pay | Admitting: Orthopaedic Surgery

## 2016-05-30 DIAGNOSIS — L89523 Pressure ulcer of left ankle, stage 3: Secondary | ICD-10-CM | POA: Diagnosis not present

## 2016-05-30 DIAGNOSIS — L928 Other granulomatous disorders of the skin and subcutaneous tissue: Secondary | ICD-10-CM | POA: Diagnosis not present

## 2016-05-30 DIAGNOSIS — L89154 Pressure ulcer of sacral region, stage 4: Secondary | ICD-10-CM | POA: Diagnosis not present

## 2016-06-06 DIAGNOSIS — I5032 Chronic diastolic (congestive) heart failure: Secondary | ICD-10-CM | POA: Diagnosis not present

## 2016-06-06 DIAGNOSIS — I10 Essential (primary) hypertension: Secondary | ICD-10-CM | POA: Diagnosis not present

## 2016-06-06 DIAGNOSIS — L89523 Pressure ulcer of left ankle, stage 3: Secondary | ICD-10-CM | POA: Diagnosis not present

## 2016-06-06 DIAGNOSIS — L89154 Pressure ulcer of sacral region, stage 4: Secondary | ICD-10-CM | POA: Diagnosis not present

## 2016-06-08 DIAGNOSIS — C50919 Malignant neoplasm of unspecified site of unspecified female breast: Secondary | ICD-10-CM | POA: Diagnosis not present

## 2016-06-08 DIAGNOSIS — L89154 Pressure ulcer of sacral region, stage 4: Secondary | ICD-10-CM | POA: Diagnosis not present

## 2016-06-08 DIAGNOSIS — F419 Anxiety disorder, unspecified: Secondary | ICD-10-CM | POA: Diagnosis not present

## 2016-06-08 DIAGNOSIS — I509 Heart failure, unspecified: Secondary | ICD-10-CM | POA: Diagnosis not present

## 2016-06-08 DIAGNOSIS — E039 Hypothyroidism, unspecified: Secondary | ICD-10-CM | POA: Diagnosis not present

## 2016-06-08 DIAGNOSIS — N390 Urinary tract infection, site not specified: Secondary | ICD-10-CM | POA: Diagnosis not present

## 2016-06-08 DIAGNOSIS — G4700 Insomnia, unspecified: Secondary | ICD-10-CM | POA: Diagnosis not present

## 2016-06-08 DIAGNOSIS — G8929 Other chronic pain: Secondary | ICD-10-CM | POA: Diagnosis not present

## 2016-06-08 DIAGNOSIS — F339 Major depressive disorder, recurrent, unspecified: Secondary | ICD-10-CM | POA: Diagnosis not present

## 2016-06-08 DIAGNOSIS — I10 Essential (primary) hypertension: Secondary | ICD-10-CM | POA: Diagnosis not present

## 2016-06-13 DIAGNOSIS — F321 Major depressive disorder, single episode, moderate: Secondary | ICD-10-CM | POA: Diagnosis not present

## 2016-06-13 DIAGNOSIS — I5032 Chronic diastolic (congestive) heart failure: Secondary | ICD-10-CM | POA: Diagnosis not present

## 2016-06-13 DIAGNOSIS — I10 Essential (primary) hypertension: Secondary | ICD-10-CM | POA: Diagnosis not present

## 2016-06-13 DIAGNOSIS — L89154 Pressure ulcer of sacral region, stage 4: Secondary | ICD-10-CM | POA: Diagnosis not present

## 2016-06-15 DIAGNOSIS — E039 Hypothyroidism, unspecified: Secondary | ICD-10-CM | POA: Diagnosis not present

## 2016-06-15 DIAGNOSIS — I890 Lymphedema, not elsewhere classified: Secondary | ICD-10-CM | POA: Diagnosis not present

## 2016-06-20 DIAGNOSIS — L89154 Pressure ulcer of sacral region, stage 4: Secondary | ICD-10-CM | POA: Diagnosis not present

## 2016-06-20 DIAGNOSIS — E039 Hypothyroidism, unspecified: Secondary | ICD-10-CM | POA: Diagnosis not present

## 2016-06-20 DIAGNOSIS — I10 Essential (primary) hypertension: Secondary | ICD-10-CM | POA: Diagnosis not present

## 2016-06-20 DIAGNOSIS — I5032 Chronic diastolic (congestive) heart failure: Secondary | ICD-10-CM | POA: Diagnosis not present

## 2016-06-27 DIAGNOSIS — E039 Hypothyroidism, unspecified: Secondary | ICD-10-CM | POA: Diagnosis not present

## 2016-06-27 DIAGNOSIS — I5032 Chronic diastolic (congestive) heart failure: Secondary | ICD-10-CM | POA: Diagnosis not present

## 2016-06-27 DIAGNOSIS — I10 Essential (primary) hypertension: Secondary | ICD-10-CM | POA: Diagnosis not present

## 2016-06-27 DIAGNOSIS — L89154 Pressure ulcer of sacral region, stage 4: Secondary | ICD-10-CM | POA: Diagnosis not present

## 2016-07-04 ENCOUNTER — Other Ambulatory Visit: Payer: Self-pay | Admitting: Hematology & Oncology

## 2016-07-04 DIAGNOSIS — M545 Low back pain, unspecified: Secondary | ICD-10-CM

## 2016-07-04 DIAGNOSIS — C787 Secondary malignant neoplasm of liver and intrahepatic bile duct: Secondary | ICD-10-CM

## 2016-07-04 DIAGNOSIS — C50912 Malignant neoplasm of unspecified site of left female breast: Secondary | ICD-10-CM

## 2016-07-04 DIAGNOSIS — G4701 Insomnia due to medical condition: Secondary | ICD-10-CM

## 2016-07-04 DIAGNOSIS — I10 Essential (primary) hypertension: Secondary | ICD-10-CM | POA: Diagnosis not present

## 2016-07-04 DIAGNOSIS — L89154 Pressure ulcer of sacral region, stage 4: Secondary | ICD-10-CM | POA: Diagnosis not present

## 2016-07-04 DIAGNOSIS — I5032 Chronic diastolic (congestive) heart failure: Secondary | ICD-10-CM | POA: Diagnosis not present

## 2016-07-04 DIAGNOSIS — G8929 Other chronic pain: Secondary | ICD-10-CM

## 2016-07-04 DIAGNOSIS — D500 Iron deficiency anemia secondary to blood loss (chronic): Secondary | ICD-10-CM

## 2016-07-04 DIAGNOSIS — C50911 Malignant neoplasm of unspecified site of right female breast: Secondary | ICD-10-CM

## 2016-07-04 DIAGNOSIS — C50919 Malignant neoplasm of unspecified site of unspecified female breast: Secondary | ICD-10-CM

## 2016-07-04 DIAGNOSIS — I150 Renovascular hypertension: Secondary | ICD-10-CM

## 2016-07-04 DIAGNOSIS — E039 Hypothyroidism, unspecified: Secondary | ICD-10-CM | POA: Diagnosis not present

## 2016-07-04 DIAGNOSIS — D510 Vitamin B12 deficiency anemia due to intrinsic factor deficiency: Secondary | ICD-10-CM

## 2016-07-05 ENCOUNTER — Telehealth: Payer: Self-pay | Admitting: *Deleted

## 2016-07-05 NOTE — Telephone Encounter (Signed)
Patient's daughter sent request for multiple medication refills including  Fentanyl 131mcg Fentanyl 59mcg Robaxin 750mg  Oxycodone 20mg  Belsomra 20mg  Zanaflex 4mg   Patient has not been seen in this office since August 2017, and the office has not had any interaction with patient since November 2017.  Called daughter and she stated that patient was due for refills, and that this office had been providing all refills. She states her mother is in [facility]. Reviewed the chart with daughter and explained that we had not provided refills for her mother since November and that her facility is the one who provides medication. The line at that time went dead.   She called back 60 minutes later and stated she needed to clarify. She placed the request for all the medications because she had questions about them, and by requesting them, she knew the office would call her.  Again explained that Dr Marin Olp is not managing her medications right now, and that for Dr Marin Olp to provide prescriptions, she needs to be seen in the office. She understood. Patient should be discharged in about 6 weeks and she will make an appointment at that time.

## 2016-07-06 DIAGNOSIS — G8929 Other chronic pain: Secondary | ICD-10-CM | POA: Diagnosis not present

## 2016-07-06 DIAGNOSIS — L89154 Pressure ulcer of sacral region, stage 4: Secondary | ICD-10-CM | POA: Diagnosis not present

## 2016-07-06 DIAGNOSIS — C50919 Malignant neoplasm of unspecified site of unspecified female breast: Secondary | ICD-10-CM | POA: Diagnosis not present

## 2016-07-06 DIAGNOSIS — I509 Heart failure, unspecified: Secondary | ICD-10-CM | POA: Diagnosis not present

## 2016-07-06 DIAGNOSIS — I890 Lymphedema, not elsewhere classified: Secondary | ICD-10-CM | POA: Diagnosis not present

## 2016-07-06 DIAGNOSIS — E039 Hypothyroidism, unspecified: Secondary | ICD-10-CM | POA: Diagnosis not present

## 2016-07-07 ENCOUNTER — Other Ambulatory Visit: Payer: Self-pay | Admitting: Hematology & Oncology

## 2016-07-11 DIAGNOSIS — L89154 Pressure ulcer of sacral region, stage 4: Secondary | ICD-10-CM | POA: Diagnosis not present

## 2016-07-18 DIAGNOSIS — I5032 Chronic diastolic (congestive) heart failure: Secondary | ICD-10-CM | POA: Diagnosis not present

## 2016-07-18 DIAGNOSIS — L89154 Pressure ulcer of sacral region, stage 4: Secondary | ICD-10-CM | POA: Diagnosis not present

## 2016-07-18 DIAGNOSIS — E039 Hypothyroidism, unspecified: Secondary | ICD-10-CM | POA: Diagnosis not present

## 2016-07-18 DIAGNOSIS — I10 Essential (primary) hypertension: Secondary | ICD-10-CM | POA: Diagnosis not present

## 2016-07-25 DIAGNOSIS — I5032 Chronic diastolic (congestive) heart failure: Secondary | ICD-10-CM | POA: Diagnosis not present

## 2016-07-25 DIAGNOSIS — I10 Essential (primary) hypertension: Secondary | ICD-10-CM | POA: Diagnosis not present

## 2016-07-25 DIAGNOSIS — E039 Hypothyroidism, unspecified: Secondary | ICD-10-CM | POA: Diagnosis not present

## 2016-07-25 DIAGNOSIS — L89154 Pressure ulcer of sacral region, stage 4: Secondary | ICD-10-CM | POA: Diagnosis not present

## 2016-07-31 ENCOUNTER — Telehealth: Payer: Self-pay | Admitting: Radiology

## 2016-07-31 NOTE — Telephone Encounter (Signed)
Daughter states that patient is still in rehab facility.  The family prefers to wait until she has been discharged to rehab before scheduling follow up.    Cherina Dhillon Riki Rusk, RN 07/31/2016 2:17 PM

## 2016-08-01 DIAGNOSIS — E46 Unspecified protein-calorie malnutrition: Secondary | ICD-10-CM | POA: Diagnosis not present

## 2016-08-01 DIAGNOSIS — L89154 Pressure ulcer of sacral region, stage 4: Secondary | ICD-10-CM | POA: Diagnosis not present

## 2016-08-01 DIAGNOSIS — R32 Unspecified urinary incontinence: Secondary | ICD-10-CM | POA: Diagnosis not present

## 2016-08-01 DIAGNOSIS — R600 Localized edema: Secondary | ICD-10-CM | POA: Diagnosis not present

## 2016-08-08 DIAGNOSIS — R600 Localized edema: Secondary | ICD-10-CM | POA: Diagnosis not present

## 2016-08-08 DIAGNOSIS — L89154 Pressure ulcer of sacral region, stage 4: Secondary | ICD-10-CM | POA: Diagnosis not present

## 2016-08-10 DIAGNOSIS — L89154 Pressure ulcer of sacral region, stage 4: Secondary | ICD-10-CM | POA: Diagnosis not present

## 2016-08-10 DIAGNOSIS — E039 Hypothyroidism, unspecified: Secondary | ICD-10-CM | POA: Diagnosis not present

## 2016-08-10 DIAGNOSIS — G8929 Other chronic pain: Secondary | ICD-10-CM | POA: Diagnosis not present

## 2016-08-10 DIAGNOSIS — I10 Essential (primary) hypertension: Secondary | ICD-10-CM | POA: Diagnosis not present

## 2016-08-10 DIAGNOSIS — I509 Heart failure, unspecified: Secondary | ICD-10-CM | POA: Diagnosis not present

## 2016-08-10 DIAGNOSIS — R531 Weakness: Secondary | ICD-10-CM | POA: Diagnosis not present

## 2016-08-10 DIAGNOSIS — C50919 Malignant neoplasm of unspecified site of unspecified female breast: Secondary | ICD-10-CM | POA: Diagnosis not present

## 2016-08-11 ENCOUNTER — Telehealth: Payer: Self-pay | Admitting: Hematology & Oncology

## 2016-08-11 NOTE — Telephone Encounter (Signed)
Per order to sch patient for 08/15/16 lab/np. Apt was sch and I called to give apt date and time, but there was no answer.  I left detail message on VM and mailed calendar

## 2016-08-15 ENCOUNTER — Ambulatory Visit: Payer: Medicare Other | Admitting: Family

## 2016-08-15 ENCOUNTER — Other Ambulatory Visit: Payer: Medicare Other

## 2016-08-15 DIAGNOSIS — I5032 Chronic diastolic (congestive) heart failure: Secondary | ICD-10-CM | POA: Diagnosis not present

## 2016-08-15 DIAGNOSIS — E039 Hypothyroidism, unspecified: Secondary | ICD-10-CM | POA: Diagnosis not present

## 2016-08-15 DIAGNOSIS — L89154 Pressure ulcer of sacral region, stage 4: Secondary | ICD-10-CM | POA: Diagnosis not present

## 2016-08-15 DIAGNOSIS — I10 Essential (primary) hypertension: Secondary | ICD-10-CM | POA: Diagnosis not present

## 2016-08-18 ENCOUNTER — Telehealth: Payer: Self-pay | Admitting: *Deleted

## 2016-08-18 NOTE — Telephone Encounter (Signed)
Patient discharged from facility to home. Patient's daughter called office on Thursday asking for patient to be seen Friday so that she could obtain all her narcotic prescriptions.  Dr Marin Olp is not in the office Friday. Patient is a complicated cancer patient with high narcotic needs. Patient has not been seen in this office since 11/2015. Dr Marin Olp also need to see the discharge records from facility so he can see what doses and frequencies the patient had been on most recently. As such patient cannot be seen on Friday. She must wait until next week when Dr Marin Olp  is back.   Message left on daughter's personal voice mail

## 2016-08-20 ENCOUNTER — Other Ambulatory Visit: Payer: Self-pay | Admitting: Hematology & Oncology

## 2016-08-20 DIAGNOSIS — L89892 Pressure ulcer of other site, stage 2: Secondary | ICD-10-CM | POA: Diagnosis not present

## 2016-08-20 DIAGNOSIS — L89510 Pressure ulcer of right ankle, unstageable: Secondary | ICD-10-CM | POA: Diagnosis not present

## 2016-08-20 DIAGNOSIS — F321 Major depressive disorder, single episode, moderate: Secondary | ICD-10-CM | POA: Diagnosis not present

## 2016-08-20 DIAGNOSIS — C787 Secondary malignant neoplasm of liver and intrahepatic bile duct: Secondary | ICD-10-CM | POA: Diagnosis not present

## 2016-08-20 DIAGNOSIS — L89154 Pressure ulcer of sacral region, stage 4: Secondary | ICD-10-CM | POA: Diagnosis not present

## 2016-08-20 DIAGNOSIS — Z853 Personal history of malignant neoplasm of breast: Secondary | ICD-10-CM | POA: Diagnosis not present

## 2016-08-20 DIAGNOSIS — E039 Hypothyroidism, unspecified: Secondary | ICD-10-CM | POA: Diagnosis not present

## 2016-08-20 DIAGNOSIS — L89520 Pressure ulcer of left ankle, unstageable: Secondary | ICD-10-CM | POA: Diagnosis not present

## 2016-08-20 DIAGNOSIS — L89324 Pressure ulcer of left buttock, stage 4: Secondary | ICD-10-CM | POA: Diagnosis not present

## 2016-08-20 DIAGNOSIS — I5032 Chronic diastolic (congestive) heart failure: Secondary | ICD-10-CM | POA: Diagnosis not present

## 2016-08-20 DIAGNOSIS — I110 Hypertensive heart disease with heart failure: Secondary | ICD-10-CM | POA: Diagnosis not present

## 2016-08-21 DIAGNOSIS — I110 Hypertensive heart disease with heart failure: Secondary | ICD-10-CM | POA: Diagnosis not present

## 2016-08-21 DIAGNOSIS — L89892 Pressure ulcer of other site, stage 2: Secondary | ICD-10-CM | POA: Diagnosis not present

## 2016-08-21 DIAGNOSIS — L89510 Pressure ulcer of right ankle, unstageable: Secondary | ICD-10-CM | POA: Diagnosis not present

## 2016-08-21 DIAGNOSIS — L89520 Pressure ulcer of left ankle, unstageable: Secondary | ICD-10-CM | POA: Diagnosis not present

## 2016-08-21 DIAGNOSIS — L89324 Pressure ulcer of left buttock, stage 4: Secondary | ICD-10-CM | POA: Diagnosis not present

## 2016-08-21 DIAGNOSIS — L89154 Pressure ulcer of sacral region, stage 4: Secondary | ICD-10-CM | POA: Diagnosis not present

## 2016-08-23 ENCOUNTER — Ambulatory Visit (HOSPITAL_BASED_OUTPATIENT_CLINIC_OR_DEPARTMENT_OTHER): Payer: Medicare Other

## 2016-08-23 ENCOUNTER — Ambulatory Visit (HOSPITAL_BASED_OUTPATIENT_CLINIC_OR_DEPARTMENT_OTHER): Payer: Medicare Other | Admitting: Family

## 2016-08-23 ENCOUNTER — Other Ambulatory Visit (HOSPITAL_BASED_OUTPATIENT_CLINIC_OR_DEPARTMENT_OTHER): Payer: Medicare Other

## 2016-08-23 VITALS — BP 134/78 | HR 66 | Temp 97.5°F | Resp 16 | Wt 288.1 lb

## 2016-08-23 DIAGNOSIS — G4701 Insomnia due to medical condition: Secondary | ICD-10-CM | POA: Diagnosis not present

## 2016-08-23 DIAGNOSIS — Z95828 Presence of other vascular implants and grafts: Secondary | ICD-10-CM

## 2016-08-23 DIAGNOSIS — L89324 Pressure ulcer of left buttock, stage 4: Secondary | ICD-10-CM | POA: Diagnosis not present

## 2016-08-23 DIAGNOSIS — C50912 Malignant neoplasm of unspecified site of left female breast: Secondary | ICD-10-CM

## 2016-08-23 DIAGNOSIS — C50911 Malignant neoplasm of unspecified site of right female breast: Secondary | ICD-10-CM

## 2016-08-23 DIAGNOSIS — G8929 Other chronic pain: Secondary | ICD-10-CM | POA: Diagnosis not present

## 2016-08-23 DIAGNOSIS — C50919 Malignant neoplasm of unspecified site of unspecified female breast: Secondary | ICD-10-CM

## 2016-08-23 DIAGNOSIS — D510 Vitamin B12 deficiency anemia due to intrinsic factor deficiency: Secondary | ICD-10-CM

## 2016-08-23 DIAGNOSIS — N179 Acute kidney failure, unspecified: Secondary | ICD-10-CM

## 2016-08-23 DIAGNOSIS — M545 Low back pain: Secondary | ICD-10-CM | POA: Diagnosis not present

## 2016-08-23 DIAGNOSIS — Z9884 Bariatric surgery status: Secondary | ICD-10-CM

## 2016-08-23 DIAGNOSIS — D500 Iron deficiency anemia secondary to blood loss (chronic): Secondary | ICD-10-CM

## 2016-08-23 DIAGNOSIS — M5440 Lumbago with sciatica, unspecified side: Secondary | ICD-10-CM

## 2016-08-23 DIAGNOSIS — I110 Hypertensive heart disease with heart failure: Secondary | ICD-10-CM | POA: Diagnosis not present

## 2016-08-23 DIAGNOSIS — C787 Secondary malignant neoplasm of liver and intrahepatic bile duct: Secondary | ICD-10-CM

## 2016-08-23 DIAGNOSIS — L89510 Pressure ulcer of right ankle, unstageable: Secondary | ICD-10-CM | POA: Diagnosis not present

## 2016-08-23 DIAGNOSIS — L89520 Pressure ulcer of left ankle, unstageable: Secondary | ICD-10-CM | POA: Diagnosis not present

## 2016-08-23 DIAGNOSIS — I150 Renovascular hypertension: Secondary | ICD-10-CM

## 2016-08-23 DIAGNOSIS — L89892 Pressure ulcer of other site, stage 2: Secondary | ICD-10-CM | POA: Diagnosis not present

## 2016-08-23 DIAGNOSIS — L89154 Pressure ulcer of sacral region, stage 4: Secondary | ICD-10-CM | POA: Diagnosis not present

## 2016-08-23 LAB — CBC WITH DIFFERENTIAL (CANCER CENTER ONLY)
BASO#: 0 10*3/uL (ref 0.0–0.2)
BASO%: 0.4 % (ref 0.0–2.0)
EOS ABS: 0.1 10*3/uL (ref 0.0–0.5)
EOS%: 1.7 % (ref 0.0–7.0)
HCT: 35.8 % (ref 34.8–46.6)
HGB: 11.6 g/dL (ref 11.6–15.9)
LYMPH#: 1.5 10*3/uL (ref 0.9–3.3)
LYMPH%: 19.7 % (ref 14.0–48.0)
MCH: 32 pg (ref 26.0–34.0)
MCHC: 32.4 g/dL (ref 32.0–36.0)
MCV: 99 fL (ref 81–101)
MONO#: 0.7 10*3/uL (ref 0.1–0.9)
MONO%: 8.5 % (ref 0.0–13.0)
NEUT#: 5.4 10*3/uL (ref 1.5–6.5)
NEUT%: 69.7 % (ref 39.6–80.0)
Platelets: 139 10*3/uL — ABNORMAL LOW (ref 145–400)
RBC: 3.62 10*6/uL — ABNORMAL LOW (ref 3.70–5.32)
RDW: 18.4 % — AB (ref 11.1–15.7)
WBC: 7.8 10*3/uL (ref 3.9–10.0)

## 2016-08-23 LAB — CMP (CANCER CENTER ONLY)
ALT(SGPT): 34 U/L (ref 10–47)
AST: 84 U/L — ABNORMAL HIGH (ref 11–38)
Albumin: 3 g/dL — ABNORMAL LOW (ref 3.3–5.5)
Alkaline Phosphatase: 212 U/L — ABNORMAL HIGH (ref 26–84)
BUN, Bld: 38 mg/dL — ABNORMAL HIGH (ref 7–22)
CHLORIDE: 111 meq/L — AB (ref 98–108)
CO2: 22 meq/L (ref 18–33)
Calcium: 9 mg/dL (ref 8.0–10.3)
Creat: 1.6 mg/dl — ABNORMAL HIGH (ref 0.6–1.2)
Glucose, Bld: 79 mg/dL (ref 73–118)
POTASSIUM: 4.1 meq/L (ref 3.3–4.7)
Sodium: 145 mEq/L (ref 128–145)
TOTAL PROTEIN: 7.2 g/dL (ref 6.4–8.1)
Total Bilirubin: 0.6 mg/dl (ref 0.20–1.60)

## 2016-08-23 LAB — IRON AND TIBC
%SAT: 68 % — AB (ref 21–57)
Iron: 94 ug/dL (ref 41–142)
TIBC: 138 ug/dL — ABNORMAL LOW (ref 236–444)
UIBC: 44 ug/dL — AB (ref 120–384)

## 2016-08-23 LAB — FERRITIN

## 2016-08-23 MED ORDER — OXYCODONE HCL 20 MG PO TABS: 20.0000 mg | ORAL_TABLET | ORAL | 0 refills | Status: DC | PRN

## 2016-08-23 MED ORDER — LEVOTHYROXINE SODIUM 200 MCG PO TABS: 200.0000 ug | ORAL_TABLET | Freq: Every day | ORAL | 3 refills | Status: AC

## 2016-08-23 MED ORDER — PREGABALIN 200 MG PO CAPS: 200.0000 mg | ORAL_CAPSULE | Freq: Three times a day (TID) | ORAL | 0 refills | Status: DC

## 2016-08-23 MED ORDER — FENTANYL 100 MCG/HR TD PT72: 100.0000 ug | MEDICATED_PATCH | TRANSDERMAL | 0 refills | Status: DC

## 2016-08-23 MED ORDER — HEPARIN SOD (PORK) LOCK FLUSH 100 UNIT/ML IV SOLN
500.0000 [IU] | Freq: Once | INTRAVENOUS | Status: AC
  Administered 2016-08-23: 500 [IU] via INTRAVENOUS
  Filled 2016-08-23: qty 5

## 2016-08-23 MED ORDER — PANCRELIPASE (LIP-PROT-AMYL) 24000-76000 UNITS PO CPEP: 24000.0000 [IU] | ORAL_CAPSULE | Freq: Three times a day (TID) | ORAL | 0 refills | Status: AC

## 2016-08-23 MED ORDER — LEVOTHYROXINE SODIUM 75 MCG PO TABS: 75.0000 ug | ORAL_TABLET | Freq: Every day | ORAL | 3 refills | Status: AC

## 2016-08-23 MED ORDER — SUVOREXANT 20 MG PO TABS: 20.0000 mg | ORAL_TABLET | Freq: Every evening | ORAL | 0 refills | Status: DC | PRN

## 2016-08-23 MED ORDER — OXYCODONE HCL 20 MG PO TABS
20.0000 mg | ORAL_TABLET | ORAL | 0 refills | Status: DC | PRN
Start: 2016-08-23 — End: 2016-08-23

## 2016-08-23 MED ORDER — SODIUM CHLORIDE 0.9% FLUSH
10.0000 mL | INTRAVENOUS | Status: DC | PRN
  Administered 2016-08-23: 10 mL via INTRAVENOUS
  Filled 2016-08-23: qty 10

## 2016-08-23 MED FILL — fentaNYL 100 MCG/HR PT72: 100 | 30 days supply | Qty: 15 | Fill #0

## 2016-08-23 MED FILL — BELSOMRA 20 MG TABLET: 20 | 30 days supply | Qty: 30 | Fill #0

## 2016-08-23 MED FILL — LEVOTHYROXINE 75 MCG TABLET: 75 | 30 days supply | Qty: 30 | Fill #0

## 2016-08-23 MED FILL — oxyCODONE HCL 20 MG TABS: 20 | 30 days supply | Qty: 180 | Fill #0

## 2016-08-24 LAB — CANCER ANTIGEN 27.29: CA 27.29: 1767.3 U/mL — ABNORMAL HIGH (ref 0.0–38.6)

## 2016-08-24 MED ORDER — ABEMACICLIB 150 MG PO TABS: 150.0000 mg | ORAL_TABLET | Freq: Two times a day (BID) | ORAL | 3 refills | Status: DC

## 2016-08-24 NOTE — Progress Notes (Signed)
Hematology and Oncology Follow Up Visit  Kerri Vasquez [id number] 02/04/1956 60 y.o. 08/24/2016   Principle Diagnosis:  1. Metastatic breast cancer, progressive liver metastases  2. Gastric bypass for subsequent B12 and iron deficiency 3. Osteoarthritis with chronic pain 4. 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 is here today with her daughter Kerri Vasquez and grandson for follow-up. She has been in a skilled nursing facility since November and was just released to go home last week. We have not seen her in our office since August of 2017.  She states that she stopped therapy with Xeloda in November due to weakness in her lower extremities and fatigue. She has had no other treatment in the last 5 months. Her CA 27.29 is 1,767.  She was hospitalized in [place] with a stage 4 wound to the sacrum and stage 3 and unstageble  wounds to the ankle. She states that she also developed pneumonia during this time. Her wounds have healed nicely and her SNF recommended she obtain a referral to a local wound clinic for follow-up.  Her bottom is still a little tender when sitting.  She is c/o a great deal of back and leg pain not relieved for months. While in the SNF they significantly reduced her pain medication. Duragesic patch was 25 mcg every 3 days and Oxycodone 20 mg every 4 hours for breakthrough pain. She is tearful discussing her pain at this time.  She is ambulating some with a walker but the pain makes this difficult. She is in a wheel chair today.  She states that her neuropathy has worsened in her feet despite Lyrica 200 mg PO BID.   She has some nausea at times that is relieved with antiemetics. She has had no vomiting. Her appetite is a little better now that she is home. She states that she is staying hydrated. Her weight is stable.  No fever,  chills, n/v, cough, rash, dizziness, chest pain, palpitations, abdominal pain or changes in bowel or bladder habits.  No lymphadenopathy found on exam. No episodes of bleeding, bruising or petechiae.    ECOG Performance Status: 3 - Symptomatic, >50% confined to bed  Medications:  Allergies as of 08/23/2016      Reactions   Influenza Vac Split [flu Virus Vaccine] Other (See Comments)   Joint stiffness and renal failure   Pneumococcal Vaccines Other (See Comments)   "sepsis and renal failure"   Ritalin [methylphenidate Hcl]    "Heart attack"   Zostavax [zoster Vaccine Live ([place]-[employer]ck)] Other (See Comments)   Joint stiffness, renal failure   Adhesive [tape] Itching, Rash      Medication List       Accurate as of 08/23/16 11:59 PM. Always use your most recent med list.          cyanocobalamin 1000 MCG/ML injection Commonly known as:  (VITAMIN B-12) INJECT 1 ML AS DIRECTED EVERY 21 DAYS   fentaNYL 100 MCG/HR Commonly known as:  DURAGESIC - dosed mcg/hr Place 1 patch (100 mcg total) onto the skin every other day.   fentaNYL 100 MCG/HR Commonly known as:  DURAGESIC - dosed mcg/hr Place 1 patch (100 mcg total) onto the skin every other day.   fludrocortisone 0.1 MG tablet Commonly known as:  FLORINEF Take 1 tablet (100 mcg total) by mouth 2 (two) times daily.   FLUoxetine 40 MG  capsule Commonly known as:  PROZAC Take 40 mg by mouth daily before breakfast.   levothyroxine 75 MCG tablet Commonly known as:  SYNTHROID Take 1 tablet (75 mcg total) by mouth daily before breakfast.   levothyroxine 200 MCG tablet Commonly known as:  SYNTHROID, LEVOTHROID Take 1 tablet (200 mcg total) by mouth daily with breakfast.   lidocaine-prilocaine cream Commonly known as:  EMLA Apply 1 application topically as needed for port access   LORazepam 0.5 MG tablet Commonly known as:  ATIVAN PLACE 1 TABLET UNDER TONGUE EVERY 6 HOURS AS NEEDED FOR NAUSEA AND VOMITING   methocarbamol 750 MG  tablet Commonly known as:  ROBAXIN TAKE 1 TABLET BY MOUTH THREE TIMES DAILY AS NEEDED   MULTI-VITAMINS Tabs Take 1 tablet by mouth daily.   nitroGLYCERIN 0.4 MG SL tablet Commonly known as:  NITROSTAT Place 1 tablet (0.4 mg total) under the tongue every 5 (five) minutes as needed for chest pain.   ondansetron 8 MG tablet Commonly known as:  ZOFRAN TAKE 1 TABLET BY MOUTH EVERY 8 HOURS AS NEEDED FOR NAUSEA AND VOMITING.   Oxycodone HCl 20 MG Tabs Take 1 tablet (20 mg total) by mouth every 4 (four) hours as needed.   Pancrelipase (Lip-Prot-Amyl) 24000-76000 units Cpep Take 1 capsule (24,000 Units total) by mouth 3 (three) times daily before meals.   pantoprazole 40 MG tablet Commonly known as:  PROTONIX Take 1 tablet (40 mg total) by mouth 2 (two) times daily.   polyethylene glycol packet Commonly known as:  MIRALAX / GLYCOLAX Take 17 g by mouth 2 (two) times daily.   pregabalin 200 MG capsule Commonly known as:  LYRICA Take 1 capsule (200 mg total) by mouth 3 (three) times daily.   prochlorperazine 10 MG tablet Commonly known as:  COMPAZINE TAKE 1 TABLET BY MOUTH EVERY 6 HOURS AS NEEDED FOR NAUSEA AND VOMITING   promethazine 25 MG tablet Commonly known as:  PHENERGAN TAKE 1 TABLET BY MOUTH EVERY 8 HOURS AS NEEDED FOR NAUSEA AND VOMITING   Suvorexant 20 MG Tabs Commonly known as:  BELSOMRA Take 20 mg by mouth at bedtime as needed.   tiZANidine 4 MG capsule Commonly known as:  ZANAFLEX TAKE ONE CAPSULE BY MOUTH EVERY 8 HOURS       Allergies:  Allergies  Allergen Reactions  . Influenza Vac Split [Flu Virus Vaccine] Other (See Comments)    Joint stiffness and renal failure  . Pneumococcal Vaccines Other (See Comments)    "sepsis and renal failure"  . Ritalin [Methylphenidate Hcl]     "Heart attack"  . Zostavax [Zoster Vaccine Live ([employer])] Other (See Comments)    Joint stiffness, renal failure  . Adhesive [Tape] Itching and Rash    Past Medical History,  Surgical history, Social history, and Family History were reviewed and updated.  Review of Systems: All other 10 point review of systems is negative.   Physical Exam:  weight is 288 lb 1.3 oz (130.7 kg). Her oral temperature is 97.5 F (36.4 C). Her blood pressure is 134/78 and her pulse is 66. Her respiration is 16 and oxygen saturation is 100%.   Wt Readings from Last 3 Encounters:  08/23/16 288 lb 1.3 oz (130.7 kg)  03/21/16 270 lb (122.5 kg)  03/20/16 277 lb 9 oz (125.9 kg)    Ocular: Sclerae unicteric, pupils equal, round and reactive to light Ear-nose-throat: Oropharynx clear, dentition fair Lymphatic: No cervical, supraclavicular or axillary adenopathy Lungs no rales or rhonchi, good excursion  bilaterally Heart regular rate and rhythm, no murmur appreciated Abd soft, nontender, positive bowel sounds, no liver or spleen tip palpated on exam, no fluid wave MSK no focal spinal tenderness, no joint edema Neuro: non-focal, well-oriented, appropriate affect Breasts: No change, bilateral mastectomies.   Lab Results  Component Value Date   WBC 7.8 08/23/2016   HGB 11.6 08/23/2016   HCT 35.8 08/23/2016   MCV 99 08/23/2016   PLT 139 (L) 08/23/2016   Lab Results  Component Value Date   FERRITIN 6,601 (H) 08/23/2016   IRON 94 08/23/2016   TIBC 138 (L) 08/23/2016   UIBC 44 (L) 08/23/2016   IRONPCTSAT 68 (H) 08/23/2016   Lab Results  Component Value Date   RETICCTPCT 1.0 06/29/2014   RBC 3.62 (L) 08/23/2016   RETICCTABS 32.3 06/29/2014   No results found for: KPAFRELGTCHN, LAMBDASER, KAPLAMBRATIO No results found for: Kandis Cocking, IGMSERUM Lab Results  Component Value Date   TOTALPROTELP 4.7 (L) 01/31/2010   ALBUMINELP 39.4 (L) 01/31/2010   A1GS 12.1 (H) 01/31/2010   A2GS 20.3 (H) 01/31/2010   BETS 6.0 01/31/2010   BETA2SER 6.8 (H) 01/31/2010   GAMS 15.4 01/31/2010   MSPIKE NOT DETECTED 01/31/2010   SPEI  01/31/2010    (NOTE) The possibility of a faint  restricted band(s) cannot be completely excluded in the gamma region.  Suggest serum IFE to evaluate possibility, if clinically indicated. (Lab will hold sample one week. Please call Customer Service at [phone] to  add test.) Reviewed by Odis Hollingshead, MD, PhD, FCAP (Electronic Signature on File)     Chemistry      Component Value Date/Time   NA 145 08/23/2016 0932   NA 142 12/23/2015 1113   K 4.1 08/23/2016 0932   K 4.2 12/23/2015 1113   CL 111 (H) 08/23/2016 0932   CO2 22 08/23/2016 0932   CO2 20 (L) 12/23/2015 1113   BUN 38 (H) 08/23/2016 0932   BUN 25.6 12/23/2015 1113   CREATININE 1.6 (H) 08/23/2016 0932   CREATININE 1.1 12/23/2015 1113      Component Value Date/Time   CALCIUM 9.0 08/23/2016 0932   CALCIUM 8.1 (L) 12/23/2015 1113   ALKPHOS 212 (H) 08/23/2016 0932   ALKPHOS 64 12/23/2015 1113   AST 84 (H) 08/23/2016 0932   AST 14 12/23/2015 1113   ALT 34 08/23/2016 0932   ALT 14 12/23/2015 1113   BILITOT 0.60 08/23/2016 0932   BILITOT 0.46 12/23/2015 1113      Impression and Plan: [name] is a pleasant 61 yo female with metastatic breast cancer. She has been in a skilled nursing home being treated for wounds to the sacrum and ankle as well as pneumonia. She was recently released to go home with her daughter. She is tearful talking about her back/leg pain as well as the neuropathy in her feet.  She has not been on treatment since stopping her Xeloda in November 2017. Her CA 27.29 is 1,767 and LFT's are also elevated.  We will repeat her PET scan to see then extent of her disease with her not having had treatment in almost 6 months.  I discussed her pain regimen with Dr. Marin Olp and refilled her oxycodone, increased her Duragesic patch back to 100 mcg q 48 hours and changed her Lyrica to 200 mg PO TID.  Referral to the wound clinic was placed.  Once we have her scan results we will look at getting her started on Verzenio. We will plan to  see her back in one  month for follow-up.  We spent at least 40 minutes face to face with greater than 50% of that spent counseling and coordinating her care. All questions were answered and they are in agreement with the plan.  Both she and her family know to contact our office with any questions or concerns. We can certainly see her sooner if need be.   Eliezer Bottom, NP 4/26/20188:48 AM   ADDENDUM:  I saw and examined the patient with Kerri Vasquez. I must say that I am absolutely impressed with the fact that [name] is still with [place]. We have not seen her for about 7 months. She was hospitalized with a bad infection. I thought that because of this, her breast cancer would spread very quickly and passed her on to [facility].  No surprise, that her CA 27.29 is up to 1300.  We really need to think about re-staging her. I would get a PET scan since that has always been very helpful for [place].  Given the fact that she still has ER positive breast cancer, I think that we will be able to utilize the new oral agent-Verzenio. I think this should be very reasonable for [place] to try as it is oral.  We spent about 45 minutes with [name] again, I am very impressed with her resilience. She really looks much better then I would've thought.  For right now, I want to see her back in one month. We will see about getting the Versenio for her. We may have to reduce the dose over little bit.  I told her about the diarrhea.  Thankfully, her iron studies all look great. Her ferritin is quite elevated, which I think is in part from being anacute phase reactant.  Kerri Haw, MD

## 2016-09-01 ENCOUNTER — Ambulatory Visit (HOSPITAL_COMMUNITY): Payer: Medicare Other

## 2016-09-04 ENCOUNTER — Other Ambulatory Visit: Payer: Self-pay | Admitting: Hematology & Oncology

## 2016-09-05 ENCOUNTER — Telehealth: Payer: Self-pay | Admitting: *Deleted

## 2016-09-05 DIAGNOSIS — L89520 Pressure ulcer of left ankle, unstageable: Secondary | ICD-10-CM | POA: Diagnosis not present

## 2016-09-05 DIAGNOSIS — L89324 Pressure ulcer of left buttock, stage 4: Secondary | ICD-10-CM | POA: Diagnosis not present

## 2016-09-05 DIAGNOSIS — I110 Hypertensive heart disease with heart failure: Secondary | ICD-10-CM | POA: Diagnosis not present

## 2016-09-05 DIAGNOSIS — L89892 Pressure ulcer of other site, stage 2: Secondary | ICD-10-CM | POA: Diagnosis not present

## 2016-09-05 DIAGNOSIS — L89154 Pressure ulcer of sacral region, stage 4: Secondary | ICD-10-CM | POA: Diagnosis not present

## 2016-09-05 DIAGNOSIS — L89510 Pressure ulcer of right ankle, unstageable: Secondary | ICD-10-CM | POA: Diagnosis not present

## 2016-09-05 NOTE — Telephone Encounter (Signed)
Received call from [place]. She visited patient today and discovered that patient had fallen over the weekend. EMS was called but they assessed patient at the home and didn't feel it necessary to transport her to the hospital.   The RN states her ankle looks like there might be a potential fracture. She would like mobile radiology to come to the home to xray. Verbal order given for xray.  RN is also trying to set patient up with a PCP who makes house calls. She will call with results of xray.

## 2016-09-07 ENCOUNTER — Encounter (HOSPITAL_BASED_OUTPATIENT_CLINIC_OR_DEPARTMENT_OTHER): Payer: Medicare Other

## 2016-09-08 DIAGNOSIS — M79662 Pain in left lower leg: Secondary | ICD-10-CM | POA: Diagnosis not present

## 2016-09-08 DIAGNOSIS — L89154 Pressure ulcer of sacral region, stage 4: Secondary | ICD-10-CM | POA: Diagnosis not present

## 2016-09-08 DIAGNOSIS — L89324 Pressure ulcer of left buttock, stage 4: Secondary | ICD-10-CM | POA: Diagnosis not present

## 2016-09-08 DIAGNOSIS — M25572 Pain in left ankle and joints of left foot: Secondary | ICD-10-CM | POA: Diagnosis not present

## 2016-09-08 DIAGNOSIS — L89892 Pressure ulcer of other site, stage 2: Secondary | ICD-10-CM | POA: Diagnosis not present

## 2016-09-08 DIAGNOSIS — L89510 Pressure ulcer of right ankle, unstageable: Secondary | ICD-10-CM | POA: Diagnosis not present

## 2016-09-08 DIAGNOSIS — I110 Hypertensive heart disease with heart failure: Secondary | ICD-10-CM | POA: Diagnosis not present

## 2016-09-08 DIAGNOSIS — L89520 Pressure ulcer of left ankle, unstageable: Secondary | ICD-10-CM | POA: Diagnosis not present

## 2016-09-11 ENCOUNTER — Other Ambulatory Visit: Payer: Self-pay | Admitting: Hematology & Oncology

## 2016-09-11 ENCOUNTER — Encounter: Payer: Self-pay | Admitting: Hematology & Oncology

## 2016-09-12 ENCOUNTER — Encounter: Payer: Self-pay | Admitting: Hematology & Oncology

## 2016-09-13 ENCOUNTER — Telehealth: Payer: Self-pay | Admitting: *Deleted

## 2016-09-13 NOTE — Telephone Encounter (Signed)
[place], [phone]

## 2016-09-14 ENCOUNTER — Inpatient Hospital Stay (HOSPITAL_COMMUNITY): Payer: Medicare Other

## 2016-09-14 ENCOUNTER — Encounter (HOSPITAL_COMMUNITY): Payer: Self-pay | Admitting: Obstetrics and Gynecology

## 2016-09-14 ENCOUNTER — Emergency Department (HOSPITAL_COMMUNITY): Payer: Medicare Other

## 2016-09-14 ENCOUNTER — Inpatient Hospital Stay (HOSPITAL_COMMUNITY)
Admission: EM | Admit: 2016-09-14 | Discharge: 2016-09-22 | DRG: 853 | Disposition: A | Discharge status: Home / Self Care | Payer: Medicare Other | Attending: Internal Medicine | Admitting: Internal Medicine

## 2016-09-14 DIAGNOSIS — Z9884 Bariatric surgery status: Secondary | ICD-10-CM

## 2016-09-14 DIAGNOSIS — E1159 Type 2 diabetes mellitus with other circulatory complications: Secondary | ICD-10-CM | POA: Diagnosis not present

## 2016-09-14 DIAGNOSIS — A419 Sepsis, unspecified organism: Secondary | ICD-10-CM | POA: Diagnosis not present

## 2016-09-14 DIAGNOSIS — M1990 Unspecified osteoarthritis, unspecified site: Secondary | ICD-10-CM | POA: Diagnosis present

## 2016-09-14 DIAGNOSIS — N179 Acute kidney failure, unspecified: Secondary | ICD-10-CM

## 2016-09-14 DIAGNOSIS — E2740 Unspecified adrenocortical insufficiency: Secondary | ICD-10-CM | POA: Diagnosis present

## 2016-09-14 DIAGNOSIS — C787 Secondary malignant neoplasm of liver and intrahepatic bile duct: Secondary | ICD-10-CM | POA: Diagnosis not present

## 2016-09-14 DIAGNOSIS — J90 Pleural effusion, not elsewhere classified: Secondary | ICD-10-CM | POA: Diagnosis not present

## 2016-09-14 DIAGNOSIS — M25572 Pain in left ankle and joints of left foot: Secondary | ICD-10-CM | POA: Diagnosis not present

## 2016-09-14 DIAGNOSIS — K219 Gastro-esophageal reflux disease without esophagitis: Secondary | ICD-10-CM | POA: Diagnosis present

## 2016-09-14 DIAGNOSIS — I25119 Atherosclerotic heart disease of native coronary artery with unspecified angina pectoris: Secondary | ICD-10-CM | POA: Diagnosis not present

## 2016-09-14 DIAGNOSIS — G8929 Other chronic pain: Secondary | ICD-10-CM | POA: Diagnosis present

## 2016-09-14 DIAGNOSIS — W010XXA Fall on same level from slipping, tripping and stumbling without subsequent striking against object, initial encounter: Secondary | ICD-10-CM | POA: Diagnosis present

## 2016-09-14 DIAGNOSIS — Z7189 Other specified counseling: Secondary | ICD-10-CM | POA: Diagnosis not present

## 2016-09-14 DIAGNOSIS — Z515 Encounter for palliative care: Secondary | ICD-10-CM | POA: Diagnosis not present

## 2016-09-14 DIAGNOSIS — R000 Tachycardia, unspecified: Secondary | ICD-10-CM

## 2016-09-14 DIAGNOSIS — M79661 Pain in right lower leg: Secondary | ICD-10-CM | POA: Diagnosis not present

## 2016-09-14 DIAGNOSIS — C50919 Malignant neoplasm of unspecified site of unspecified female breast: Secondary | ICD-10-CM | POA: Diagnosis not present

## 2016-09-14 DIAGNOSIS — E039 Hypothyroidism, unspecified: Secondary | ICD-10-CM | POA: Diagnosis present

## 2016-09-14 DIAGNOSIS — S0990XA Unspecified injury of head, initial encounter: Secondary | ICD-10-CM | POA: Diagnosis not present

## 2016-09-14 DIAGNOSIS — I5030 Unspecified diastolic (congestive) heart failure: Secondary | ICD-10-CM | POA: Diagnosis not present

## 2016-09-14 DIAGNOSIS — Z9049 Acquired absence of other specified parts of digestive tract: Secondary | ICD-10-CM

## 2016-09-14 DIAGNOSIS — Z419 Encounter for procedure for purposes other than remedying health state, unspecified: Secondary | ICD-10-CM

## 2016-09-14 DIAGNOSIS — K7290 Hepatic failure, unspecified without coma: Secondary | ICD-10-CM | POA: Diagnosis present

## 2016-09-14 DIAGNOSIS — G4733 Obstructive sleep apnea (adult) (pediatric): Secondary | ICD-10-CM | POA: Diagnosis not present

## 2016-09-14 DIAGNOSIS — C50911 Malignant neoplasm of unspecified site of right female breast: Secondary | ICD-10-CM

## 2016-09-14 DIAGNOSIS — G4730 Sleep apnea, unspecified: Secondary | ICD-10-CM | POA: Diagnosis present

## 2016-09-14 DIAGNOSIS — S82302A Unspecified fracture of lower end of left tibia, initial encounter for closed fracture: Secondary | ICD-10-CM

## 2016-09-14 DIAGNOSIS — Z6841 Body Mass Index (BMI) 40.0 and over, adult: Secondary | ICD-10-CM

## 2016-09-14 DIAGNOSIS — M797 Fibromyalgia: Secondary | ICD-10-CM | POA: Diagnosis present

## 2016-09-14 DIAGNOSIS — I5181 Takotsubo syndrome: Secondary | ICD-10-CM | POA: Diagnosis present

## 2016-09-14 DIAGNOSIS — N390 Urinary tract infection, site not specified: Secondary | ICD-10-CM | POA: Diagnosis not present

## 2016-09-14 DIAGNOSIS — G9340 Encephalopathy, unspecified: Secondary | ICD-10-CM | POA: Diagnosis not present

## 2016-09-14 DIAGNOSIS — T148XXA Other injury of unspecified body region, initial encounter: Secondary | ICD-10-CM | POA: Diagnosis not present

## 2016-09-14 DIAGNOSIS — I130 Hypertensive heart and chronic kidney disease with heart failure and stage 1 through stage 4 chronic kidney disease, or unspecified chronic kidney disease: Secondary | ICD-10-CM | POA: Diagnosis present

## 2016-09-14 DIAGNOSIS — Z79899 Other long term (current) drug therapy: Secondary | ICD-10-CM

## 2016-09-14 DIAGNOSIS — A409 Streptococcal sepsis, unspecified: Secondary | ICD-10-CM | POA: Diagnosis not present

## 2016-09-14 DIAGNOSIS — G4701 Insomnia due to medical condition: Secondary | ICD-10-CM

## 2016-09-14 DIAGNOSIS — M5440 Lumbago with sciatica, unspecified side: Secondary | ICD-10-CM

## 2016-09-14 DIAGNOSIS — R791 Abnormal coagulation profile: Secondary | ICD-10-CM | POA: Diagnosis not present

## 2016-09-14 DIAGNOSIS — I110 Hypertensive heart disease with heart failure: Secondary | ICD-10-CM | POA: Diagnosis not present

## 2016-09-14 DIAGNOSIS — I890 Lymphedema, not elsewhere classified: Secondary | ICD-10-CM | POA: Diagnosis present

## 2016-09-14 DIAGNOSIS — Z888 Allergy status to other drugs, medicaments and biological substances status: Secondary | ICD-10-CM

## 2016-09-14 DIAGNOSIS — Z9221 Personal history of antineoplastic chemotherapy: Secondary | ICD-10-CM

## 2016-09-14 DIAGNOSIS — Z66 Do not resuscitate: Secondary | ICD-10-CM | POA: Diagnosis not present

## 2016-09-14 DIAGNOSIS — W19XXXA Unspecified fall, initial encounter: Secondary | ICD-10-CM

## 2016-09-14 DIAGNOSIS — M545 Low back pain: Secondary | ICD-10-CM | POA: Diagnosis present

## 2016-09-14 DIAGNOSIS — Z887 Allergy status to serum and vaccine status: Secondary | ICD-10-CM

## 2016-09-14 DIAGNOSIS — L89892 Pressure ulcer of other site, stage 2: Secondary | ICD-10-CM | POA: Diagnosis not present

## 2016-09-14 DIAGNOSIS — M25562 Pain in left knee: Secondary | ICD-10-CM | POA: Diagnosis not present

## 2016-09-14 DIAGNOSIS — L89324 Pressure ulcer of left buttock, stage 4: Secondary | ICD-10-CM | POA: Diagnosis not present

## 2016-09-14 DIAGNOSIS — M5441 Lumbago with sciatica, right side: Secondary | ICD-10-CM

## 2016-09-14 DIAGNOSIS — L89510 Pressure ulcer of right ankle, unstageable: Secondary | ICD-10-CM | POA: Diagnosis not present

## 2016-09-14 DIAGNOSIS — L8994 Pressure ulcer of unspecified site, stage 4: Secondary | ICD-10-CM | POA: Diagnosis not present

## 2016-09-14 DIAGNOSIS — N183 Chronic kidney disease, stage 3 (moderate): Secondary | ICD-10-CM | POA: Diagnosis present

## 2016-09-14 DIAGNOSIS — L89520 Pressure ulcer of left ankle, unstageable: Secondary | ICD-10-CM | POA: Diagnosis not present

## 2016-09-14 DIAGNOSIS — L8990 Pressure ulcer of unspecified site, unspecified stage: Secondary | ICD-10-CM | POA: Insufficient documentation

## 2016-09-14 DIAGNOSIS — I5032 Chronic diastolic (congestive) heart failure: Secondary | ICD-10-CM | POA: Diagnosis present

## 2016-09-14 DIAGNOSIS — N3001 Acute cystitis with hematuria: Secondary | ICD-10-CM

## 2016-09-14 DIAGNOSIS — R7881 Bacteremia: Secondary | ICD-10-CM | POA: Diagnosis not present

## 2016-09-14 DIAGNOSIS — Z959 Presence of cardiac and vascular implant and graft, unspecified: Secondary | ICD-10-CM | POA: Diagnosis not present

## 2016-09-14 DIAGNOSIS — L89154 Pressure ulcer of sacral region, stage 4: Secondary | ICD-10-CM | POA: Diagnosis present

## 2016-09-14 DIAGNOSIS — S92909A Unspecified fracture of unspecified foot, initial encounter for closed fracture: Secondary | ICD-10-CM | POA: Diagnosis not present

## 2016-09-14 DIAGNOSIS — F329 Major depressive disorder, single episode, unspecified: Secondary | ICD-10-CM | POA: Diagnosis present

## 2016-09-14 DIAGNOSIS — Z853 Personal history of malignant neoplasm of breast: Secondary | ICD-10-CM

## 2016-09-14 DIAGNOSIS — S82392A Other fracture of lower end of left tibia, initial encounter for closed fracture: Secondary | ICD-10-CM | POA: Diagnosis not present

## 2016-09-14 DIAGNOSIS — C7951 Secondary malignant neoplasm of bone: Secondary | ICD-10-CM | POA: Diagnosis not present

## 2016-09-14 DIAGNOSIS — C786 Secondary malignant neoplasm of retroperitoneum and peritoneum: Secondary | ICD-10-CM | POA: Diagnosis not present

## 2016-09-14 DIAGNOSIS — C50912 Malignant neoplasm of unspecified site of left female breast: Secondary | ICD-10-CM

## 2016-09-14 DIAGNOSIS — R748 Abnormal levels of other serum enzymes: Secondary | ICD-10-CM | POA: Diagnosis present

## 2016-09-14 DIAGNOSIS — A408 Other streptococcal sepsis: Secondary | ICD-10-CM | POA: Diagnosis not present

## 2016-09-14 DIAGNOSIS — I252 Old myocardial infarction: Secondary | ICD-10-CM

## 2016-09-14 DIAGNOSIS — Z85850 Personal history of malignant neoplasm of thyroid: Secondary | ICD-10-CM

## 2016-09-14 DIAGNOSIS — M419 Scoliosis, unspecified: Secondary | ICD-10-CM | POA: Diagnosis present

## 2016-09-14 DIAGNOSIS — A4151 Sepsis due to Escherichia coli [E. coli]: Principal | ICD-10-CM | POA: Diagnosis present

## 2016-09-14 DIAGNOSIS — M7989 Other specified soft tissue disorders: Secondary | ICD-10-CM | POA: Diagnosis not present

## 2016-09-14 DIAGNOSIS — R6520 Severe sepsis without septic shock: Secondary | ICD-10-CM | POA: Diagnosis not present

## 2016-09-14 DIAGNOSIS — S82852A Displaced trimalleolar fracture of left lower leg, initial encounter for closed fracture: Secondary | ICD-10-CM | POA: Diagnosis present

## 2016-09-14 DIAGNOSIS — M4800 Spinal stenosis, site unspecified: Secondary | ICD-10-CM | POA: Diagnosis present

## 2016-09-14 DIAGNOSIS — I150 Renovascular hypertension: Secondary | ICD-10-CM

## 2016-09-14 DIAGNOSIS — M79662 Pain in left lower leg: Secondary | ICD-10-CM | POA: Diagnosis not present

## 2016-09-14 DIAGNOSIS — M159 Polyosteoarthritis, unspecified: Secondary | ICD-10-CM | POA: Diagnosis present

## 2016-09-14 DIAGNOSIS — L89303 Pressure ulcer of unspecified buttock, stage 3: Secondary | ICD-10-CM | POA: Diagnosis not present

## 2016-09-14 DIAGNOSIS — S99912A Unspecified injury of left ankle, initial encounter: Secondary | ICD-10-CM | POA: Diagnosis not present

## 2016-09-14 DIAGNOSIS — R188 Other ascites: Secondary | ICD-10-CM | POA: Diagnosis not present

## 2016-09-14 DIAGNOSIS — E1122 Type 2 diabetes mellitus with diabetic chronic kidney disease: Secondary | ICD-10-CM | POA: Diagnosis present

## 2016-09-14 DIAGNOSIS — F339 Major depressive disorder, recurrent, unspecified: Secondary | ICD-10-CM | POA: Diagnosis not present

## 2016-09-14 DIAGNOSIS — S8252XB Displaced fracture of medial malleolus of left tibia, initial encounter for open fracture type I or II: Secondary | ICD-10-CM | POA: Diagnosis not present

## 2016-09-14 DIAGNOSIS — D500 Iron deficiency anemia secondary to blood loss (chronic): Secondary | ICD-10-CM

## 2016-09-14 LAB — URINALYSIS, ROUTINE W REFLEX MICROSCOPIC
Bilirubin Urine: NEGATIVE
Glucose, UA: NEGATIVE mg/dL
Ketones, ur: 20 mg/dL — AB
Nitrite: POSITIVE — AB
PROTEIN: 100 mg/dL — AB
SPECIFIC GRAVITY, URINE: 1.02 (ref 1.005–1.030)
pH: 5 (ref 5.0–8.0)

## 2016-09-14 LAB — BASIC METABOLIC PANEL
ANION GAP: 13 (ref 5–15)
BUN: 66 mg/dL — ABNORMAL HIGH (ref 6–20)
CALCIUM: 8.8 mg/dL — AB (ref 8.9–10.3)
CHLORIDE: 109 mmol/L (ref 101–111)
CO2: 17 mmol/L — AB (ref 22–32)
CREATININE: 1.7 mg/dL — AB (ref 0.44–1.00)
GFR calc non Af Amer: 31 mL/min — ABNORMAL LOW (ref >60)
GFR, EST AFRICAN AMERICAN: 36 mL/min — AB (ref >60)
Glucose, Bld: 72 mg/dL (ref 65–99)
Potassium: 4.4 mmol/L (ref 3.5–5.1)
SODIUM: 139 mmol/L (ref 135–145)

## 2016-09-14 LAB — I-STAT CG4 LACTIC ACID, ED: LACTIC ACID, VENOUS: 2.82 mmol/L — AB (ref 0.5–1.9)

## 2016-09-14 LAB — CBC WITH DIFFERENTIAL/PLATELET
BASOS ABS: 0 10*3/uL (ref 0.0–0.1)
BASOS PCT: 0 %
EOS ABS: 0.1 10*3/uL (ref 0.0–0.7)
Eosinophils Relative: 1 %
HEMATOCRIT: 41.5 % (ref 36.0–46.0)
HEMOGLOBIN: 14 g/dL (ref 12.0–15.0)
Lymphocytes Relative: 3 %
Lymphs Abs: 0.4 10*3/uL — ABNORMAL LOW (ref 0.7–4.0)
MCH: 33.1 pg (ref 26.0–34.0)
MCHC: 33.7 g/dL (ref 30.0–36.0)
MCV: 98.1 fL (ref 78.0–100.0)
Monocytes Absolute: 0.6 10*3/uL (ref 0.1–1.0)
Monocytes Relative: 4 %
NEUTROS ABS: 12.2 10*3/uL — AB (ref 1.7–7.7)
NEUTROS PCT: 92 %
Platelets: 216 10*3/uL (ref 150–400)
RBC: 4.23 MIL/uL (ref 3.87–5.11)
RDW: 17.2 % — AB (ref 11.5–15.5)
WBC: 13.3 10*3/uL — ABNORMAL HIGH (ref 4.0–10.5)

## 2016-09-14 LAB — D-DIMER, QUANTITATIVE (NOT AT ARMC): D DIMER QUANT: 10.53 ug{FEU}/mL — AB (ref 0.00–0.50)

## 2016-09-14 MED ORDER — SODIUM CHLORIDE 0.9 % IV BOLUS (SEPSIS)
1000.0000 mL | Freq: Once | INTRAVENOUS | Status: AC
  Administered 2016-09-14: 1000 mL via INTRAVENOUS

## 2016-09-14 MED ORDER — PREGABALIN 50 MG PO CAPS
200.0000 mg | ORAL_CAPSULE | Freq: Three times a day (TID) | ORAL | Status: DC
  Administered 2016-09-15 – 2016-09-22 (×18): 200 mg via ORAL
  Filled 2016-09-14: qty 4
  Filled 2016-09-14 (×9): qty 1
  Filled 2016-09-14: qty 4
  Filled 2016-09-14 (×5): qty 1
  Filled 2016-09-14: qty 4
  Filled 2016-09-14 (×2): qty 1

## 2016-09-14 MED ORDER — PANTOPRAZOLE SODIUM 40 MG PO TBEC
40.0000 mg | DELAYED_RELEASE_TABLET | Freq: Two times a day (BID) | ORAL | Status: DC
  Administered 2016-09-15 – 2016-09-22 (×12): 40 mg via ORAL
  Filled 2016-09-14 (×12): qty 1

## 2016-09-14 MED ORDER — ACETAMINOPHEN 650 MG RE SUPP: 650.0000 mg | Freq: Four times a day (QID) | RECTAL | Status: DC | PRN

## 2016-09-14 MED ORDER — LEVOTHYROXINE SODIUM 200 MCG PO TABS
200.0000 ug | ORAL_TABLET | Freq: Every day | ORAL | Status: DC
  Filled 2016-09-14: qty 1

## 2016-09-14 MED ORDER — PROMETHAZINE HCL 25 MG PO TABS: 25.0000 mg | ORAL_TABLET | Freq: Four times a day (QID) | ORAL | Status: DC | PRN

## 2016-09-14 MED ORDER — PANCRELIPASE (LIP-PROT-AMYL) 12000-38000 UNITS PO CPEP
24000.0000 [IU] | ORAL_CAPSULE | Freq: Three times a day (TID) | ORAL | Status: DC
  Administered 2016-09-15 – 2016-09-21 (×14): 24000 [IU] via ORAL
  Filled 2016-09-14 (×17): qty 2

## 2016-09-14 MED ORDER — FENTANYL 100 MCG/HR TD PT72
100.0000 ug | MEDICATED_PATCH | TRANSDERMAL | Status: DC
  Administered 2016-09-15 – 2016-09-21 (×4): 100 ug via TRANSDERMAL
  Filled 2016-09-14 (×4): qty 1

## 2016-09-14 MED ORDER — ACETAMINOPHEN 325 MG PO TABS: 650.0000 mg | ORAL_TABLET | Freq: Four times a day (QID) | ORAL | Status: DC | PRN

## 2016-09-14 MED ORDER — ADULT MULTIVITAMIN W/MINERALS CH
1.0000 | ORAL_TABLET | Freq: Every day | ORAL | Status: DC
Start: 2016-09-15 — End: 2016-09-22
  Administered 2016-09-15 – 2016-09-21 (×6): 1 via ORAL
  Filled 2016-09-14 (×7): qty 1

## 2016-09-14 MED ORDER — LEVOTHYROXINE SODIUM 75 MCG PO TABS: 75.0000 ug | ORAL_TABLET | Freq: Every day | ORAL | Status: DC

## 2016-09-14 MED ORDER — SODIUM CHLORIDE 0.9 % IV SOLN
INTRAVENOUS | Status: AC
  Administered 2016-09-15: 01:00:00 via INTRAVENOUS

## 2016-09-14 MED ORDER — FLUOXETINE HCL 20 MG PO CAPS
40.0000 mg | ORAL_CAPSULE | Freq: Every day | ORAL | Status: DC
Start: 2016-09-15 — End: 2016-09-22
  Administered 2016-09-15 – 2016-09-22 (×7): 40 mg via ORAL
  Filled 2016-09-14 (×7): qty 2

## 2016-09-14 MED ORDER — ONDANSETRON HCL 4 MG PO TABS: 4.0000 mg | ORAL_TABLET | Freq: Four times a day (QID) | ORAL | Status: DC | PRN

## 2016-09-14 MED ORDER — DEXTROSE 5 % IV SOLN
2.0000 g | INTRAVENOUS | Status: DC
  Administered 2016-09-15 – 2016-09-16 (×2): 2 g via INTRAVENOUS
  Filled 2016-09-14 (×3): qty 2

## 2016-09-14 MED ORDER — NITROGLYCERIN 0.4 MG SL SUBL: 0.4000 mg | SUBLINGUAL_TABLET | SUBLINGUAL | Status: DC | PRN

## 2016-09-14 MED ORDER — HEPARIN (PORCINE) IN NACL 100-0.45 UNIT/ML-% IJ SOLN
1000.0000 [IU]/h | INTRAMUSCULAR | Status: DC
  Administered 2016-09-15: 1000 [IU]/h via INTRAVENOUS
  Filled 2016-09-14: qty 250

## 2016-09-14 MED ORDER — CEFTRIAXONE SODIUM 2 G IJ SOLR
2.0000 g | Freq: Once | INTRAMUSCULAR | Status: AC
  Administered 2016-09-14: 2 g via INTRAVENOUS
  Filled 2016-09-14: qty 2

## 2016-09-14 MED ORDER — OXYCODONE HCL 5 MG PO TABS
20.0000 mg | ORAL_TABLET | ORAL | Status: DC | PRN
  Administered 2016-09-15 – 2016-09-19 (×9): 20 mg via ORAL
  Filled 2016-09-14 (×9): qty 4

## 2016-09-14 MED ORDER — METHOCARBAMOL 500 MG PO TABS
750.0000 mg | ORAL_TABLET | Freq: Three times a day (TID) | ORAL | Status: DC | PRN
  Administered 2016-09-19: 750 mg via ORAL
  Filled 2016-09-14: qty 2

## 2016-09-14 MED ORDER — ONDANSETRON HCL 4 MG/2ML IJ SOLN
4.0000 mg | Freq: Four times a day (QID) | INTRAMUSCULAR | Status: DC | PRN
  Administered 2016-09-18: 4 mg via INTRAVENOUS

## 2016-09-14 NOTE — ED Provider Notes (Signed)
[facility] DEPT Provider Note   CSN: [id number] Arrival date & time: 09/14/16  1746     History   Chief Complaint Chief Complaint  Patient presents with  . Fall    HPI Kerri Vasquez is a 61 y.o. female.  The history is provided by the patient and medical records. No language interpreter was used.  [place] is a 61 y.o. female  with complex medical history as listed below who presents to the Emergency Department complaining of left ankle and lower leg pain after a fall 1.5 weeks ago. Patient states that she was trying to walk to the restroom when she tripped over her foot and fell. Immediately had pain to the left lower leg. Did not go seek care. Lives at home with daughter. Home health nurse came today and noticed significant bruising. Per EMS, home health brought in a portable x-ray and it was found that she had a tibial fracture. EMS then called for transport to ER. Using her home meds, but no other medications prior to arrival for leg pain. Patient has not been able to ambulate on leg due to pain since initial injury. Denies fevers, chills. Denies cuts or open wounds to the leg. Does report a sacral ulcer which daughter has been cleaning daily.   Past Medical History:  Diagnosis Date  . Anemia   . Breast cancer, right breast ([place]) dx'd 2008   right  . CHF (congestive heart failure) ([place])   . Chronic lower back pain   . Depression   . Diabetes mellitus    type II, controlled by diet 05/25/11   . Fibromyalgia   . Fracture of multiple toes    right toes x 4 toes- excluding small toe  . GERD (gastroesophageal reflux disease)   . Hypertension   . Hypothyroidism   . Incisional hernia   . Intestinal obstruction ([place])   . Iron deficiency anemia due to chronic blood loss 07/29/2015  . 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 Migraine    "weekly; but they don't last long" (08/26/2014)  . Myocardial infarction  ([place]) 06/2014   S/P percutaneous intervention procedure a metastatic lesion in the liver  . Nausea and vomiting 04/2015  . Osteoarthritis of multiple joints   . Pernicious anemia 05/30/2011   Iron transfusion and VB12 on 06/06/11  . Renal insufficiency    hx renal failure 2011  . Sciatica   . Scoliosis   . Septic arthritis ([place]) 01/28/10   and spinal stenosis , moderate scoliosis   . Sleep apnea    hx of off machine x 4 years   . Spinal stenosis   . Thyroid cancer [facility]) 2013    Patient Active Problem List   Diagnosis Date Noted  . Acute lower UTI 09/14/2016  . Closed fracture of left distal tibia   . Sepsis ([place]) 03/15/2016  . Acute respiratory failure with hypoxia ([place]) 03/14/2016  . Tachycardia 03/14/2016  . Decubitus ulcer of sacral region, stage 3 ([place]) 03/14/2016  . CAP (community acquired pneumonia) 03/14/2016  . Pressure injury of skin 02/03/2016  . Rib fracture 02/02/2016  . Fall 02/02/2016  . Iron deficiency anemia due to chronic blood loss 07/29/2015  . 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 Elevated troponin 04/20/2015  . Chest discomfort 04/20/2015  . Vomiting 04/19/2015  . Intractable nausea and vomiting 04/19/2015  . Acute kidney injury superimposed on chronic kidney disease ([place]) 04/19/2015  .  Primary malignant neoplasm of breast ([place]) 12/07/2014  . Takotsubo syndrome 09/04/2014  . Chronic diastolic CHF (congestive heart failure) ([place]) 09/04/2014  . Acute on chronic diastolic CHF (congestive heart failure) ([place]) 09/04/2014  . Apical ballooning syndrome 09/04/2014  . Dehydration   . Peripheral edema   . AKI (acute kidney injury) ([place]) 08/26/2014  . Chronic systolic heart failure ([place]) 08/13/2014  . DNR (do not resuscitate) discussion 07/15/2014  . Weakness generalized 07/15/2014  . Palliative care encounter 07/15/2014  . Chronic pain syndrome 07/15/2014  . Acute systolic heart failure ([place]) 07/14/2014  . Dyspnea   . Congestive dilated cardiomyopathy ([place])   .  Shock circulatory ([place]) 07/13/2014  . Encephalopathy acute 07/13/2014  . Pleural effusion 07/11/2014  . DM2 (diabetes mellitus, type 2) ([place]) 07/11/2014  . Essential hypertension 07/11/2014  . Hypothyroidism 07/11/2014  . NSTEMI (non-ST elevated myocardial infarction) ([place]) 07/11/2014  . SOB (shortness of breath) 07/11/2014  . Acquired hypothyroidism 07/11/2014  . Chest pain   . Other specified hypotension   . Breast cancer metastasized to liver ([place])   . Fibrositis 08/06/2013  . Diabetes mellitus ([place]) 08/06/2013  . Fibromyalgia 08/06/2013  . Personal history of other diseases of the digestive system 08/06/2013  . Obstructive apnea 08/06/2013  . Lumbar canal stenosis 10/25/2012  . Degeneration of intervertebral disc of lumbosacral region 10/05/2012  . Rheumatoid arthritis ([place]) 10/05/2012  . 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 Pernicious anemia 05/30/2011  . Anemia 05/29/2011  . Sleep apnea 05/29/2011  . Degenerative joint disease 05/16/2011  . Arthritis, degenerative 05/16/2011  . Adenocarcinoma, breast ([place]) 03/07/2011  . Rheumatoid arthritis(714.0) 03/07/2011  . Depression 03/07/2011  . Migraine 03/07/2011  . GERD (gastroesophageal reflux disease) 03/07/2011  . Acid reflux 03/07/2011  . Spinal stenosis with chronic low back pain     Past Surgical History:  Procedure Laterality Date  . BREAST BIOPSY Right 2008  . BREAST IMPLANT REMOVAL Left 2015  . BREAST RECONSTRUCTION Bilateral 2008; 2009   "w/the mastectomy; "  . BREAST RECONSTRUCTION Left ~ 2010   "it capsized"  . CARDIAC CATHETERIZATION    . CHOLECYSTECTOMY OPEN  06/29/85  . COLECTOMY  2011   "SBO"  . GASTRIC BYPASS  2006   "w/scope"  . IR GENERIC HISTORICAL  05/13/2014   IR RADIOLOGIST EVAL & MGMT 05/13/2014 Arne Cleveland, MD [facility] INTERV RAD  . IRRIGATION AND DEBRIDEMENT ABSCESS N/A 03/17/2016   Procedure: IRRIGATION AND DEBRIDIMENT SACRAL DECUBITUS ULCER;  Surgeon: Armandina Gemma, MD;   Location: [facility] ORS;  Service: General;  Laterality: N/A;  . LEFT HEART CATHETERIZATION WITH CORONARY ANGIOGRAM N/A 07/14/2014   Procedure: LEFT HEART CATHETERIZATION WITH CORONARY ANGIOGRAM;  Surgeon: Leonie Man, MD;  Location: [facility] CATH LAB;  Service: Cardiovascular;  Laterality: N/A;  . LIVER BIOPSY  04/2015  . MASTECTOMY Bilateral 2008  . MULTIPLE EXTRACTIONS WITH ALVEOLOPLASTY  06/08/2011   Procedure: MULTIPLE EXTRACION WITH ALVEOLOPLASTY;  Surgeon: Lenn Cal, DDS;  Location: [facility] ORS;  Service: Oral Surgery;  Laterality: N/A;  Extraction of tooth #'s 3,4,7 with alveoloplasty and Gross Debridement of Remaining Teeth  . [place] LIVER TUMOR  2011; 01/26/2012  . THYROID LOBECTOMY  02/16/2012   Procedure: THYROID LOBECTOMY;  Surgeon: Earnstine Regal, MD;  Location: [facility] ORS;  Service: General;  Laterality: Right;  . TONSILLECTOMY  1969    OB History    Gravida Para Term Preterm AB Living  1   SAB TAB Ectopic Multiple Live Births                   Home Medications    Prior to Admission medications   Medication Sig Start Date End Date Taking? Authorizing Provider  cyanocobalamin (,VITAMIN B-12,) 1000 MCG/ML injection INJECT 1 ML AS DIRECTED EVERY 21 DAYS 03/18/15  Yes [place], Holli Humbles, NP  dexamethasone (DECADRON) 4 MG tablet TAKE 2 TABLETS BY MOUTH TWICE DAILY WITH A MEAL STARTING THE DAY AFTER CHEMOTHERAPY 09/05/16  Yes [place], Rudell Cobb, MD  diphenoxylate-atropine (LOMOTIL) 2.5-0.025 MG tablet TAKE 1 TABLET BY MOUTH FOUR TIMES DAILY AS NEEDED FOR DIARRHEA. 09/11/16  Yes Volanda Napoleon, MD  fentaNYL (DURAGESIC - DOSED MCG/HR) 100 MCG/HR Place 1 patch (100 mcg total) onto the skin every other day. 08/23/16  Yes [place], Holli Humbles, NP  FLUoxetine (PROZAC) 40 MG capsule Take 40 mg by mouth daily before breakfast.    Yes [provider]  ibuprofen (ADVIL,MOTRIN) 600 MG tablet Take 600 mg by mouth every 8 (eight) hours as needed for pain. 08/20/16  Yes  [provider]  levothyroxine (SYNTHROID) 75 MCG tablet Take 1 tablet (75 mcg total) by mouth daily before breakfast. 08/23/16  Yes [place], Holli Humbles, NP  levothyroxine (SYNTHROID, LEVOTHROID) 200 MCG tablet Take 1 tablet (200 mcg total) by mouth daily with breakfast. 08/23/16  Yes [place], Holli Humbles, NP  lidocaine-prilocaine (EMLA) cream Apply 1 application topically as needed for port access 12/30/15  Yes [provider]  methocarbamol (ROBAXIN) 750 MG tablet TAKE 1 TABLET BY MOUTH THREE TIMES DAILY AS NEEDED 08/20/16  Yes Volanda Napoleon, MD  Multiple Vitamin (MULTI-VITAMINS) TABS Take 1 tablet by mouth daily. 11/29/11  Yes [provider]  nitroGLYCERIN (NITROSTAT) 0.4 MG SL tablet Place 1 tablet (0.4 mg total) under the tongue every 5 (five) minutes as needed for chest pain. 07/18/14  Yes Annita Brod, MD  ondansetron (ZOFRAN) 8 MG tablet TAKE 1 TABLET BY MOUTH EVERY 8 HOURS AS NEEDED FOR NAUSEA AND VOMITING. 08/20/16  Yes [place], Rudell Cobb, MD  Oxycodone HCl 20 MG TABS Take 1 tablet (20 mg total) by mouth every 4 (four) hours as needed. 08/23/16  Yes [place], Holli Humbles, NP  Pancrelipase, Lip-Prot-Amyl, 24000-76000 units CPEP Take 1 capsule (24,000 Units total) by mouth 3 (three) times daily before meals. 08/23/16  Yes [place], Holli Humbles, NP  pantoprazole (PROTONIX) 40 MG tablet Take 1 tablet (40 mg total) by mouth 2 (two) times daily. 09/16/15  Yes [place], Rudell Cobb, MD  polyethylene glycol (MIRALAX / GLYCOLAX) packet Take 17 g by mouth 2 (two) times daily. Patient taking differently: Take 17 g by mouth daily as needed for mild constipation.  09/16/15  Yes [place], Rudell Cobb, MD  pregabalin (LYRICA) 200 MG capsule Take 1 capsule (200 mg total) by mouth 3 (three) times daily. 08/23/16  Yes [place], Holli Humbles, NP  prochlorperazine (COMPAZINE) 10 MG tablet TAKE 1 TABLET BY MOUTH EVERY 6 HOURS AS NEEDED FOR NAUSEA AND VOMITING 09/05/16  Yes [place], Rudell Cobb, MD  promethazine  (PHENERGAN) 25 MG tablet TAKE 1 TABLET BY MOUTH EVERY 8 HOURS AS NEEDED FOR NAUSEA AND VOMITING 09/11/16  Yes En[place], Rudell Cobb, MD  Suvorexant (BELSOMRA) 20 MG TABS Take 20 mg by mouth at bedtime as needed. 08/23/16  Yes [place], Holli Humbles, NP  tiZANidine (ZANAFLEX) 4 MG capsule TAKE ONE CAPSULE BY MOUTH EVERY 8 HOURS 08/20/16  Yes [place],  Rudell Cobb, MD  Abemaciclib (VERZENIO) 150 MG TABS Take 150 mg by mouth 2 (two) times daily. 08/24/16   Volanda Napoleon, MD  fludrocortisone (FLORINEF) 0.1 MG tablet Take 1 tablet (100 mcg total) by mouth 2 (two) times daily. Patient not taking: Reported on 09/14/2016 03/20/16   Hosie Poisson, MD    Family History Family History  Problem Relation Age of Onset  . Cancer Mother        breast  . Hypertension Mother   . Anesthesia problems Daughter     Social History Social History  Substance Use Topics  . Smoking status: Never Smoker  . Smokeless tobacco: Never Used     Comment: NEVER USED TOBACCO  . Alcohol use No     Allergies   Influenza vac split [flu virus vaccine]; Pneumococcal vaccines; Ritalin [methylphenidate hcl]; Zostavax [zoster vaccine live (oka-merck)]; Albumin (human); and Adhesive [tape]   Review of Systems Review of Systems  Musculoskeletal: Positive for arthralgias.  Skin: Positive for color change (Bruising) and wound.  All other systems reviewed and are negative.    Physical Exam Updated Vital Signs BP 102/60   Pulse (!) 110   Temp 99.4 F (37.4 C) (Rectal)   Resp 20   Ht 5\' 6"  (1.676 m)   Wt 288 lb (130.6 kg)   LMP 08/30/2006   SpO2 98%   BMI 46.48 kg/m   Physical Exam  Constitutional: She is oriented to person, place, and time. She appears well-developed and well-nourished. No distress.  HENT:  Head: Normocephalic and atraumatic.  Cardiovascular: Normal heart sounds.   No murmur heard. Tachycardic but regular.  Pulmonary/Chest: Effort normal and breath sounds normal. No respiratory distress.  Abdominal:  Soft. She exhibits no distension. There is no tenderness.  Musculoskeletal:  LLE: Tenderness to palpation of medial malleoli and bilateral aspects of distal lower extremity. Ecchymosis to the ankle. 2+ DP. Sensation intact.   Neurological: She is alert and oriented to person, place, and time.  Skin: Skin is warm and dry.  Sacral decub: see image below. Mildly tender to palpation along edges. No focal areas of induration or fluctuance. No purulent drainage.  Nursing note and vitals reviewed.      ED Treatments / Results  Labs (all labs ordered are listed, but only abnormal results are displayed) Labs Reviewed  CBC WITH DIFFERENTIAL/PLATELET - Abnormal; Notable for the following:       Result Value   WBC 13.3 (*)    RDW 17.2 (*)    Neutro Abs 12.2 (*)    Lymphs Abs 0.4 (*)    All other components within normal limits  BASIC METABOLIC PANEL - Abnormal; Notable for the following:    CO2 17 (*)    BUN 66 (*)    Creatinine, Ser 1.70 (*)    Calcium 8.8 (*)    GFR calc non Af Amer 31 (*)    GFR calc Af Amer 36 (*)    All other components within normal limits  URINALYSIS, ROUTINE W REFLEX MICROSCOPIC - Abnormal; Notable for the following:    Color, Urine AMBER (*)    APPearance CLOUDY (*)    Hgb urine dipstick LARGE (*)    Ketones, ur 20 (*)    Protein, ur 100 (*)    Nitrite POSITIVE (*)    Leukocytes, UA LARGE (*)    Bacteria, UA MANY (*)    Squamous Epithelial / LPF 0-5 (*)    Non Squamous Epithelial 0-5 (*)  All other components within normal limits  D-DIMER, QUANTITATIVE (NOT AT [facility]) - Abnormal; Notable for the following:    D-Dimer, Quant 10.53 (*)    All other components within normal limits  I-STAT CG4 LACTIC ACID, ED - Abnormal; Notable for the following:    Lactic Acid, Venous 2.82 (*)    All other components within normal limits  CULTURE, BLOOD (ROUTINE X 2)  CULTURE, BLOOD (ROUTINE X 2)  URINE CULTURE  APTT  PROTIME-INR  TSH  TROPONIN I  BASIC METABOLIC  PANEL  CBC    EKG  EKG Interpretation None       Radiology Dg Ankle Complete Left  Result Date: 09/14/2016 CLINICAL DATA:  Fall approximately 1 week ago EXAM: LEFT ANKLE COMPLETE - 3+ VIEW COMPARISON:  None. FINDINGS: Bones are osteopenic which limits the exam. Suspect subtle linear nondisplaced fracture through the distal fibular malleolus. There may be subtle posterior malleolar fracture of the distal tibia as well. There is a fracture through the distal, medial tibia with extension of fracture lucency to the superior, medial articular surface of the ankle joint. Minimal medial displacement of distal fracture fragment. There is soft tissue swelling. Questionable osteochondral lesion lateral talar dome on the oblique view. IMPRESSION: 1. Bones appear osteopenic which limits the exam 2. Slightly displaced and mildly comminuted intra-articular fracture of the distal tibia and medial malleolus. Questionable posterior malleolar fracture. 3. Suspect nondisplaced distal fibular fracture. 4. Possible small osteochondral defect lateral talar dome on one view. Electronically Signed   By: Donavan Foil M.D.   On: 09/14/2016 19:01   Dg Knee Complete 4 Views Left  Result Date: 09/14/2016 CLINICAL DATA:  Fall a week ago.  Pain and swelling. EXAM: LEFT KNEE - COMPLETE 4+ VIEW COMPARISON:  None. FINDINGS: Callus formation associated with the proximal fibula is consistent with a remote healed fracture. Mild degenerative changes. No joint effusion. No acute fracture seen within the knee. IMPRESSION: No acute fracture seen within the knee. Remote healed proximal fibular fracture. Electronically Signed   By: Dorise Bullion III M.D   On: 09/14/2016 18:59    Procedures Procedures (including critical care time)  CRITICAL CARE Performed by: Ozella Almond Marcha Licklider   Total critical care time: 35 minutes  Critical care time was exclusive of separately billable procedures and treating other patients.  Critical  care was necessary to treat or prevent imminent or life-threatening deterioration.  Critical care was time spent personally by me on the following activities: development of treatment plan with patient and/or surrogate as well as nursing, discussions with consultants, evaluation of patient's response to treatment, examination of patient, obtaining history from patient or surrogate, ordering and performing treatments and interventions, ordering and review of laboratory studies, ordering and review of radiographic studies, pulse oximetry and re-evaluation of patient's condition.   Medications Ordered in ED Medications  cefTRIAXone (ROCEPHIN) 2 g in dextrose 5 % 50 mL IVPB (not administered)  sodium chloride 0.9 % bolus 1,000 mL (not administered)  heparin ADULT infusion 100 units/mL (25000 units/29mL sodium chloride 0.45%) (not administered)  promethazine (PHENERGAN) tablet 25 mg (not administered)  fentaNYL (DURAGESIC - dosed mcg/hr) 100 mcg (not administered)  levothyroxine (SYNTHROID, LEVOTHROID) tablet 75 mcg (not administered)  levothyroxine (SYNTHROID, LEVOTHROID) tablet 200 mcg (not administered)  Oxycodone HCl TABS 20 mg (not administered)  Pancrelipase (Lip-Prot-Amyl) 24000-76000 units CPEP 24,000 Units (not administered)  pregabalin (LYRICA) capsule 200 mg (not administered)  methocarbamol (ROBAXIN) tablet 750 mg (not administered)  pantoprazole (PROTONIX) EC tablet  40 mg (not administered)  MULTI-VITAMINS TABS 1 tablet (not administered)  nitroGLYCERIN (NITROSTAT) SL tablet 0.4 mg (not administered)  FLUoxetine (PROZAC) capsule 40 mg (not administered)  acetaminophen (TYLENOL) tablet 650 mg (not administered)    Or  acetaminophen (TYLENOL) suppository 650 mg (not administered)  ondansetron (ZOFRAN) tablet 4 mg (not administered)    Or  ondansetron (ZOFRAN) injection 4 mg (not administered)  0.9 %  sodium chloride infusion (not administered)  sodium chloride 0.9 % bolus 1,000 mL  (0 mLs Intravenous Stopped 09/14/16 2145)  sodium chloride 0.9 % bolus 1,000 mL (1,000 mLs Intravenous New Bag/Given 09/14/16 2205)  cefTRIAXone (ROCEPHIN) 2 g in dextrose 5 % 50 mL IVPB (2 g Intravenous New Bag/Given 09/14/16 2205)     Initial Impression / Assessment and Plan / ED Course  I have reviewed the triage vital signs and the nursing notes.  Pertinent labs & imaging results that were available during my care of the patient were reviewed by me and considered in my medical decision making (see chart for details).    Kerri Vasquez is a 62 y.o. female who presents to ED for evaluation of left lower extremity pain. Patient tripped and fell over a week ago. Home health nurse came to the house today and noticed bruising and patient told her she hasn't been able to walk due to pain. X-ray shows a slightly displaced and mildly comminuted intra-articular fracture of the distal tibia and medial malleolus, possibly a posterior malleolar fracture as well. Possibly a nondisplaced distal fibular fracture. Patient was splinted. During emergency department stay, patient became hypotensive (88/61) and tachycardic (110's). Labs and blood culture obtained. Lactic acid of 2.82. White count of 13.3. UA is nitrite positive with large leuks and TNTC white cells - sent for culture and started on Rocephin. BP improved to 102/60 after first fluid bolus - another liter ordered.   Hospitalist, Dr. Hal Hope, consulted who will evaluate patient in ER. Recommends obtaining d-dimer and he will see.   D-dimer elevated at 10.53 - spoke with hospitalist again who recommends starting patient on Heparin drip and he will admit. Orders placed per hospitalist request and care assumed by hospitalist service for admission.   Patient seen by and discussed with Dr. Wilson Singer who agrees with treatment plan.    Final Clinical Impressions(s) / ED Diagnoses   Final diagnoses:  Sepsis, due to unspecified organism [facility])    Closed fracture of distal end of left tibia, unspecified fracture morphology, initial encounter  Acute cystitis with hematuria    New Prescriptions New Prescriptions   No medications on file     Laurren Lepkowski, Ozella Almond, PA-C 09/14/16 2350    Virgel Manifold, MD 09/23/16 [phone]

## 2016-09-14 NOTE — ED Notes (Signed)
Family at bedside. 

## 2016-09-14 NOTE — ED Notes (Signed)
Bed: NK53 Expected date:  Expected time:  Means of arrival:  Comments: EMS-ankle

## 2016-09-14 NOTE — ED Notes (Signed)
Family requesting update, PA made aware

## 2016-09-14 NOTE — ED Triage Notes (Signed)
Per EMS: Pt coming from home Pt fell approximately a week ago and home health brought in a portable x-ray. They determines she had a tibial fracture. Pt has an abrasion to the right foot. Pt's left leg and foot has bruising and she cannot move it. Pt has good pedal pulses  107/64 132 Sinus tach on 12 lead RR 18 CBG 83 Pt reports not eating today.  Pt's family reports recent hallucinations but patient is AxO x 4.

## 2016-09-14 NOTE — ED Notes (Signed)
PA made aware of low BP 

## 2016-09-14 NOTE — ED Notes (Signed)
Unable to obtain second set of blood cultures. PA made aware.

## 2016-09-14 NOTE — H&P (Addendum)
History and Physical    Pura Picinich Martinez-Mahjouba JOA:[id number] DOB: [birth year] DOA: 09/14/2016  PCP: [name], MD  Patient coming from: Home.  Chief Complaint: Left leg pain.  HPI: Kerri Vasquez is a 61 y.o. female with history of stage IV breast cancer, history of cardiomyopathy with recovered EF last EF measured in November 2017 was 65-70% with grade 1 diastolic dysfunction, hypothyroidism, sacral decubitus ulcer, arthritis presents to the ER with complaints of increasing pain in the left ankle. Patient was in rehabilitation until 2 weeks ago and was discharged. Patient states she ambulates with the help of a walker. She had a fall last week and since then patient's left ankle has been hurting. Patient states she also hit her head but did not lose consciousness.   ED Course: In the ER x-rays revealed left ankle fracture involving the distal tibia and fibula. While in the ER patient is hypotensive tachycardic mildly febrile with elevated lactate and leukocytosis. UA is compatible with UTI. Denies any nausea vomiting diarrhea chest pain or shortness of breath. Patient was given fluid bolus for which blood pressure improved. Patient was placed on ceftriaxone for UTI and sepsis and admitted for further management.  Review of Systems: As per HPI, rest all negative.   Past Medical History:  Diagnosis Date  . Anemia   . Breast cancer, right breast ([place]) dx'd 2008   right  . CHF (congestive heart failure) ([place])   . Chronic lower back pain   . Depression   . Diabetes mellitus    type II, controlled by diet 05/25/11   . Fibromyalgia   . Fracture of multiple toes    right toes x 4 toes- excluding small toe  . GERD (gastroesophageal reflux disease)   . Hypertension   . Hypothyroidism   . Incisional hernia   . Intestinal obstruction ([place])   . Iron deficiency anemia due to chronic blood loss 07/29/2015  . 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 Migraine    "weekly; but they don't last long" (08/26/2014)  . Myocardial infarction ([place]) 06/2014   S/P percutaneous intervention procedure a metastatic lesion in the liver  . Nausea and vomiting 04/2015  . Osteoarthritis of multiple joints   . Pernicious anemia 05/30/2011   Iron transfusion and VB12 on 06/06/11  . Renal insufficiency    hx renal failure 2011  . Sciatica   . Scoliosis   . Septic arthritis ([place]) 01/28/10   and spinal stenosis , moderate scoliosis   . Sleep apnea    hx of off machine x 4 years   . Spinal stenosis   . Thyroid cancer ([place]) 2013    Past Surgical History:  Procedure Laterality Date  . BREAST BIOPSY Right 2008  . BREAST IMPLANT REMOVAL Left 2015  . BREAST RECONSTRUCTION Bilateral 2008; 2009   "w/the mastectomy; "  . BREAST RECONSTRUCTION Left ~ 2010   "it capsized"  . CARDIAC CATHETERIZATION    . CHOLECYSTECTOMY OPEN  06/29/85  . COLECTOMY  2011   "SBO"  . GASTRIC BYPASS  2006   "w/scope"  . IR GENERIC HISTORICAL  05/13/2014   IR RADIOLOGIST EVAL & MGMT 05/13/2014 Arne Cleveland, MD [facility] INTERV RAD  . IRRIGATION AND DEBRIDEMENT ABSCESS N/A 03/17/2016   Procedure: IRRIGATION AND DEBRIDIMENT SACRAL DECUBITUS ULCER;  Surgeon: Armandina Gemma, MD;  Location: [facility] ORS;  Service: General;  Laterality: N/A;  . LEFT HEART CATHETERIZATION WITH CORONARY ANGIOGRAM N/A 07/14/2014  Procedure: LEFT HEART CATHETERIZATION WITH CORONARY ANGIOGRAM;  Surgeon: Leonie Man, MD;  Location: [facility] CATH LAB;  Service: Cardiovascular;  Laterality: N/A;  . LIVER BIOPSY  04/2015  . MASTECTOMY Bilateral 2008  . MULTIPLE EXTRACTIONS WITH ALVEOLOPLASTY  06/08/2011   Procedure: MULTIPLE EXTRACION WITH ALVEOLOPLASTY;  Surgeon: Lenn Cal, DDS;  Location: [facility];  Service: Oral Surgery;  Laterality: N/A;  Extraction of tooth #'s 3,4,7 with alveoloplasty and Gross Debridement of Remaining Teeth  . [place] LIVER TUMOR  2011; 01/26/2012  . THYROID LOBECTOMY   02/16/2012   Procedure: THYROID LOBECTOMY;  Surgeon: Earnstine Regal, MD;  Location: [facility];  Service: General;  Laterality: Right;  . TONSILLECTOMY  1969     reports that she has never smoked. She has never used smokeless tobacco. She reports that she does not drink alcohol or use drugs.  Allergies  Allergen Reactions  . Influenza Vac Split [Flu Virus Vaccine] Other (See Comments)    Joint stiffness and renal failure  . Pneumococcal Vaccines Other (See Comments)    "sepsis and renal failure"  . Ritalin [Methylphenidate Hcl]     "Heart attack"  . Zostavax [Zoster Vaccine Live ([employer])] Other (See Comments)    Joint stiffness, renal failure  . Albumin (Human)     Updating patient's allergy by adding albumin human so that medication interactions tied to albumin would fire. Tied to [id number] and [id number]. Patient's medication interactions were not firing because the albumin value below (06/26/2014) was not causing any medication interaction as [employer] had changed it to a bulk chemical rather than a drug ingredient.   . Adhesive [Tape] Itching and Rash    Family History  Problem Relation Age of Onset  . Cancer Mother        breast  . Hypertension Mother   . Anesthesia problems Daughter     Prior to Admission medications   Medication Sig Start Date End Date Taking? Authorizing Provider  cyanocobalamin (,VITAMIN B-12,) 1000 MCG/ML injection INJECT 1 ML AS DIRECTED EVERY 21 DAYS 03/18/15  Yes [place], Holli Humbles, NP  dexamethasone (DECADRON) 4 MG tablet TAKE 2 TABLETS BY MOUTH TWICE DAILY WITH A MEAL STARTING THE DAY AFTER CHEMOTHERAPY 09/05/16  Yes Ennever, Rudell Cobb, MD  diphenoxylate-atropine (LOMOTIL) 2.5-0.025 MG tablet TAKE 1 TABLET BY MOUTH FOUR TIMES DAILY AS NEEDED FOR DIARRHEA. 09/11/16  Yes Volanda Napoleon, MD  fentaNYL (DURAGESIC - DOSED MCG/HR) 100 MCG/HR Place 1 patch (100 mcg total) onto the skin every other day. 08/23/16  Yes [place], Holli Humbles, NP  FLUoxetine (PROZAC) 40 MG  capsule Take 40 mg by mouth daily before breakfast.    Yes [provider]  ibuprofen (ADVIL,MOTRIN) 600 MG tablet Take 600 mg by mouth every 8 (eight) hours as needed for pain. 08/20/16  Yes [provider]  levothyroxine (SYNTHROID) 75 MCG tablet Take 1 tablet (75 mcg total) by mouth daily before breakfast. 08/23/16  Yes [place], Holli Humbles, NP  levothyroxine (SYNTHROID, LEVOTHROID) 200 MCG tablet Take 1 tablet (200 mcg total) by mouth daily with breakfast. 08/23/16  Yes [place], Holli Humbles, NP  lidocaine-prilocaine (EMLA) cream Apply 1 application topically as needed for port access 12/30/15  Yes [provider]  methocarbamol (ROBAXIN) 750 MG tablet TAKE 1 TABLET BY MOUTH THREE TIMES DAILY AS NEEDED 08/20/16  Yes Volanda Napoleon, MD  Multiple Vitamin (MULTI-VITAMINS) TABS Take 1 tablet by mouth daily. 11/29/11  Yes [provider]  nitroGLYCERIN (NITROSTAT) 0.4 MG SL  tablet Place 1 tablet (0.4 mg total) under the tongue every 5 (five) minutes as needed for chest pain. 07/18/14  Yes Annita Brod, MD  ondansetron (ZOFRAN) 8 MG tablet TAKE 1 TABLET BY MOUTH EVERY 8 HOURS AS NEEDED FOR NAUSEA AND VOMITING. 08/20/16  Yes Ennever, Rudell Cobb, MD  Oxycodone HCl 20 MG TABS Take 1 tablet (20 mg total) by mouth every 4 (four) hours as needed. 08/23/16  Yes [place], Holli Humbles, NP  Pancrelipase, Lip-Prot-Amyl, 24000-76000 units CPEP Take 1 capsule (24,000 Units total) by mouth 3 (three) times daily before meals. 08/23/16  Yes [place], Holli Humbles, NP  pantoprazole (PROTONIX) 40 MG tablet Take 1 tablet (40 mg total) by mouth 2 (two) times daily. 09/16/15  Yes Ennever, Rudell Cobb, MD  polyethylene glycol (MIRALAX / GLYCOLAX) packet Take 17 g by mouth 2 (two) times daily. Patient taking differently: Take 17 g by mouth daily as needed for mild constipation.  09/16/15  Yes Ennever, Rudell Cobb, MD  pregabalin (LYRICA) 200 MG capsule Take 1 capsule (200 mg total) by mouth 3 (three) times daily.  08/23/16  Yes [place], Holli Humbles, NP  prochlorperazine (COMPAZINE) 10 MG tablet TAKE 1 TABLET BY MOUTH EVERY 6 HOURS AS NEEDED FOR NAUSEA AND VOMITING 09/05/16  Yes Ennever, Rudell Cobb, MD  promethazine (PHENERGAN) 25 MG tablet TAKE 1 TABLET BY MOUTH EVERY 8 HOURS AS NEEDED FOR NAUSEA AND VOMITING 09/11/16  Yes Ennever, Rudell Cobb, MD  Suvorexant (BELSOMRA) 20 MG TABS Take 20 mg by mouth at bedtime as needed. 08/23/16  Yes [place], Holli Humbles, NP  tiZANidine (ZANAFLEX) 4 MG capsule TAKE ONE CAPSULE BY MOUTH EVERY 8 HOURS 08/20/16  Yes Ennever, Rudell Cobb, MD  Abemaciclib (VERZENIO) 150 MG TABS Take 150 mg by mouth 2 (two) times daily. 08/24/16   Volanda Napoleon, MD  fludrocortisone (FLORINEF) 0.1 MG tablet Take 1 tablet (100 mcg total) by mouth 2 (two) times daily. Patient not taking: Reported on 09/14/2016 03/20/16   Hosie Poisson, MD    Physical Exam: Vitals:   09/14/16 1806 09/14/16 1912 09/14/16 1933 09/14/16 2058  BP:  (!) 88/61  102/60  Pulse:  (!) 112  (!) 110  Resp:  18  20  Temp:  97.8 F (36.6 C) 99.4 F (37.4 C)   TempSrc:  Oral Rectal   SpO2:  95%  98%  Weight: 130.6 kg (288 lb)     Height: 5\' 6"  (1.676 m)         Constitutional: Obese not in distress. Vitals:   09/14/16 1806 09/14/16 1912 09/14/16 1933 09/14/16 2058  BP:  (!) 88/61  102/60  Pulse:  (!) 112  (!) 110  Resp:  18  20  Temp:  97.8 F (36.6 C) 99.4 F (37.4 C)   TempSrc:  Oral Rectal   SpO2:  95%  98%  Weight: 130.6 kg (288 lb)     Height: 5\' 6"  (1.676 m)      Eyes: Anicteric no pallor. ENMT: No discharge from the ears eyes nose and mouth. Neck: No mass felt. No neck rigidity. Respiratory: No rhonchi or crepitations. Cardiovascular: S1-S2 heard no murmurs appreciated. Abdomen: Soft nontender bowel sounds present. Musculoskeletal: Left ankle in brace. Skin: Sacral decubitus ulcer stage IV. Neurologic: Alert awake oriented to time place and person. Moves all extremities. Psychiatric: Appears normal. Normal  affect.   Labs on Admission: I have personally reviewed following labs and imaging studies  CBC:  Recent Labs Lab 09/14/16 1952  WBC 13.3*  NEUTROABS 12.2*  HGB 14.0  HCT 41.5  MCV 98.1  PLT 637   Basic Metabolic Panel:  Recent Labs Lab 09/14/16 1952  NA 139  K 4.4  CL 109  CO2 17*  GLUCOSE 72  BUN 66*  CREATININE 1.70*  CALCIUM 8.8*   GFR: Estimated Creatinine Clearance: 47.6 mL/min (A) (by C-G formula based on SCr of 1.7 mg/dL (H)). Liver Function Tests: No results for input(s): AST, ALT, ALKPHOS, BILITOT, PROT, ALBUMIN in the last 168 hours. No results for input(s): LIPASE, AMYLASE in the last 168 hours. No results for input(s): AMMONIA in the last 168 hours. Coagulation Profile: No results for input(s): INR, PROTIME in the last 168 hours. Cardiac Enzymes: No results for input(s): CKTOTAL, CKMB, CKMBINDEX, TROPONINI in the last 168 hours. BNP (last 3 results) No results for input(s): PROBNP in the last 8760 hours. HbA1C: No results for input(s): HGBA1C in the last 72 hours. CBG: No results for input(s): GLUCAP in the last 168 hours. Lipid Profile: No results for input(s): CHOL, HDL, LDLCALC, TRIG, CHOLHDL, LDLDIRECT in the last 72 hours. Thyroid Function Tests: No results for input(s): TSH, T4TOTAL, FREET4, T3FREE, THYROIDAB in the last 72 hours. Anemia Panel: No results for input(s): VITAMINB12, FOLATE, FERRITIN, TIBC, IRON, RETICCTPCT in the last 72 hours. Urine analysis:    Component Value Date/Time   COLORURINE AMBER (A) 09/14/2016 2011   APPEARANCEUR CLOUDY (A) 09/14/2016 2011   LABSPEC 1.020 09/14/2016 2011   PHURINE 5.0 09/14/2016 2011   GLUCOSEU NEGATIVE 09/14/2016 2011   HGBUR LARGE (A) 09/14/2016 2011   BILIRUBINUR NEGATIVE 09/14/2016 2011   BILIRUBINUR negative 05/24/2011 1630   KETONESUR 20 (A) 09/14/2016 2011   PROTEINUR 100 (A) 09/14/2016 2011   UROBILINOGEN 0.2 08/26/2014 1850   NITRITE POSITIVE (A) 09/14/2016 2011   LEUKOCYTESUR  LARGE (A) 09/14/2016 2011   Sepsis Labs: @LABRCNTIP (procalcitonin:4,lacticidven:4) )No results found for this or any previous visit (from the past 240 hour(s)).   Radiological Exams on Admission: Dg Ankle Complete Left  Result Date: 09/14/2016 CLINICAL DATA:  Fall approximately 1 week ago EXAM: LEFT ANKLE COMPLETE - 3+ VIEW COMPARISON:  None. FINDINGS: Bones are osteopenic which limits the exam. Suspect subtle linear nondisplaced fracture through the distal fibular malleolus. There may be subtle posterior malleolar fracture of the distal tibia as well. There is a fracture through the distal, medial tibia with extension of fracture lucency to the superior, medial articular surface of the ankle joint. Minimal medial displacement of distal fracture fragment. There is soft tissue swelling. Questionable osteochondral lesion lateral talar dome on the oblique view. IMPRESSION: 1. Bones appear osteopenic which limits the exam 2. Slightly displaced and mildly comminuted intra-articular fracture of the distal tibia and medial malleolus. Questionable posterior malleolar fracture. 3. Suspect nondisplaced distal fibular fracture. 4. Possible small osteochondral defect lateral talar dome on one view. Electronically Signed   By: Donavan Foil M.D.   On: 09/14/2016 19:01   Dg Knee Complete 4 Views Left  Result Date: 09/14/2016 CLINICAL DATA:  Fall a week ago.  Pain and swelling. EXAM: LEFT KNEE - COMPLETE 4+ VIEW COMPARISON:  None. FINDINGS: Callus formation associated with the proximal fibula is consistent with a remote healed fracture. Mild degenerative changes. No joint effusion. No acute fracture seen within the knee. IMPRESSION: No acute fracture seen within the knee. Remote healed proximal fibular fracture. Electronically Signed   By: Dorise Bullion III M.D   On: 09/14/2016 18:59     Assessment/Plan  Principal Problem:   Sepsis ([place]) Active Problems:   Sleep apnea   Breast cancer metastasized to liver  [facility])   Takotsubo syndrome   Acute lower UTI    1. Sepsis secondary to UTI - continue with hydration. Caution patient does have a history of diastolic CHF. Follow lactate levels procalcitonin levels and blood cultures and urine cultures. Patient is on ceftriaxone. 2. Tachycardia with elevated d-dimer - since patient has chronic kidney disease CT angiogram of the chest was not done. Patient is empirically placed on heparin and VQ scan ordered to rule out PE. 3. Left ankle fracture - Dr. Berenice Primas orthopedic surgeon has been consulted. 4. History of Takotsubo cardiomyopathy - last EF measured in November 2017 was 65-70% with grade 1 diastolic dysfunction. Presently appears compensated and not on any diuretics. Troponin mildly elevated may be from tachycardia and hypotension. We will cycle cardiac markers checked VQ scan patient is on heparin. 5. Breast cancer stage IV - patient states her last chemotherapy was 2 months ago and further chemotherapy was on hold due to patient's decreased ambulation. 6. Hypothyroidism on Synthroid. Check TSH. 7. History of arthritis and chronic back pain - on pain relief medication including fentanyl patch. 8. Chronic kidney disease stage III - creatinine appears to be at baseline. 9. Sacral decubitus ulcer stage IV - wound team consulted.   DVT prophylaxis: Heparin. Code Status: Full code.  Family Communication: Discussed with patient.  Disposition Plan: Patient may need rehabilitation.  Consults called: Wound team  Admission status: Inpatient.    Rise Patience MD [facility] Pager [phone].  If 7PM-7AM, please contact night-coverage [email or web address] Password [facility]  09/14/2016, 11:41 PM

## 2016-09-14 NOTE — Progress Notes (Signed)
Pharmacy Antibiotic Note  Kerri Vasquez is a 61 y.o. female admitted on 09/14/2016 with UTI  Pharmacy has been consulted for ceftriaxone dosing.  Plan: Ceftriaxone 2gm IV q24h (BMI 46 kg/m2) Pharmacy will sign off as renal adjustment is not needed  Height: 5\' 6"  (167.6 cm) Weight: 288 lb (130.6 kg) IBW/kg (Calculated) : 59.3  Temp (24hrs), Avg:98.4 F (36.9 C), Min:97.8 F (36.6 C), Max:99.4 F (37.4 C)   Recent Labs Lab 09/14/16 1952 09/14/16 2000  WBC 13.3*  --   CREATININE 1.70*  --   LATICACIDVEN  --  2.82*    Estimated Creatinine Clearance: 47.6 mL/min (A) (by C-G formula based on SCr of 1.7 mg/dL (H)).    Allergies  Allergen Reactions  . Influenza Vac Split [Flu Virus Vaccine] Other (See Comments)    Joint stiffness and renal failure  . Pneumococcal Vaccines Other (See Comments)    "sepsis and renal failure"  . Ritalin [Methylphenidate Hcl]     "Heart attack"  . Zostavax [Zoster Vaccine Live ([employer])] Other (See Comments)    Joint stiffness, renal failure  . Albumin (Human)     Updating patient's allergy by adding albumin human so that medication interactions tied to albumin would fire. Tied to [id number] and [id number]. Patient's medication interactions were not firing because the albumin value below (06/26/2014) was not causing any medication interaction as [employer] had changed it to a bulk chemical rather than a drug ingredient.   . Adhesive [Tape] Itching and Rash    Antimicrobials this admission: 5/17 ceftriaxone >>   Microbiology results: 5/17 BCx: sent 5/17 UCx: sent Thank you for allowing pharmacy to be a part of this patient's care.  Dolly Rias RPh 09/14/2016, 9:48 PM Pager [phone]

## 2016-09-15 DIAGNOSIS — L8990 Pressure ulcer of unspecified site, unspecified stage: Secondary | ICD-10-CM | POA: Insufficient documentation

## 2016-09-15 LAB — CBC
HEMATOCRIT: 42.5 % (ref 36.0–46.0)
HEMOGLOBIN: 14.2 g/dL (ref 12.0–15.0)
MCH: 33 pg (ref 26.0–34.0)
MCHC: 33.4 g/dL (ref 30.0–36.0)
MCV: 98.8 fL (ref 78.0–100.0)
Platelets: 184 10*3/uL (ref 150–400)
RBC: 4.3 MIL/uL (ref 3.87–5.11)
RDW: 17.1 % — AB (ref 11.5–15.5)
WBC: 10.1 10*3/uL (ref 4.0–10.5)

## 2016-09-15 LAB — BLOOD CULTURE ID PANEL (REFLEXED)
ACINETOBACTER BAUMANNII: NOT DETECTED
CANDIDA TROPICALIS: NOT DETECTED
Candida albicans: NOT DETECTED
Candida glabrata: NOT DETECTED
Candida krusei: NOT DETECTED
Candida parapsilosis: NOT DETECTED
ENTEROBACTER CLOACAE COMPLEX: NOT DETECTED
Enterobacteriaceae species: NOT DETECTED
Enterococcus species: NOT DETECTED
Escherichia coli: NOT DETECTED
Haemophilus influenzae: NOT DETECTED
Klebsiella oxytoca: NOT DETECTED
Klebsiella pneumoniae: NOT DETECTED
Listeria monocytogenes: NOT DETECTED
NEISSERIA MENINGITIDIS: NOT DETECTED
PROTEUS SPECIES: NOT DETECTED
Pseudomonas aeruginosa: NOT DETECTED
STREPTOCOCCUS AGALACTIAE: NOT DETECTED
STREPTOCOCCUS PNEUMONIAE: NOT DETECTED
Serratia marcescens: NOT DETECTED
Staphylococcus aureus (BCID): NOT DETECTED
Staphylococcus species: NOT DETECTED
Streptococcus pyogenes: NOT DETECTED
Streptococcus species: NOT DETECTED

## 2016-09-15 LAB — TSH: TSH: 5.669 u[IU]/mL — ABNORMAL HIGH (ref 0.350–4.500)

## 2016-09-15 LAB — BASIC METABOLIC PANEL
ANION GAP: 14 (ref 5–15)
BUN: 58 mg/dL — AB (ref 6–20)
CHLORIDE: 110 mmol/L (ref 101–111)
CO2: 14 mmol/L — ABNORMAL LOW (ref 22–32)
Calcium: 8.4 mg/dL — ABNORMAL LOW (ref 8.9–10.3)
Creatinine, Ser: 1.51 mg/dL — ABNORMAL HIGH (ref 0.44–1.00)
GFR calc Af Amer: 42 mL/min — ABNORMAL LOW (ref >60)
GFR, EST NON AFRICAN AMERICAN: 36 mL/min — AB (ref >60)
GLUCOSE: 73 mg/dL (ref 65–99)
POTASSIUM: 4.7 mmol/L (ref 3.5–5.1)
Sodium: 138 mmol/L (ref 135–145)

## 2016-09-15 LAB — PROTIME-INR
INR: 1.22
PROTHROMBIN TIME: 15.4 s — AB (ref 11.4–15.2)

## 2016-09-15 LAB — TROPONIN I
TROPONIN I: 0.03 ng/mL — AB (ref <0.03)
TROPONIN I: 0.04 ng/mL — AB (ref <0.03)
TROPONIN I: 0.05 ng/mL — AB (ref <0.03)

## 2016-09-15 LAB — PROCALCITONIN: PROCALCITONIN: 5.09 ng/mL

## 2016-09-15 LAB — LACTIC ACID, PLASMA: Lactic Acid, Venous: 1.6 mmol/L (ref 0.5–1.9)

## 2016-09-15 LAB — MRSA PCR SCREENING: MRSA by PCR: NEGATIVE

## 2016-09-15 LAB — APTT: APTT: 25 s (ref 24–36)

## 2016-09-15 MED ORDER — ENSURE ENLIVE PO LIQD
237.0000 mL | Freq: Two times a day (BID) | ORAL | Status: DC
  Administered 2016-09-16 – 2016-09-20 (×7): 237 mL via ORAL

## 2016-09-15 MED ORDER — CHLORHEXIDINE GLUCONATE CLOTH 2 % EX PADS
6.0000 | MEDICATED_PAD | Freq: Every day | CUTANEOUS | Status: DC
  Administered 2016-09-16 – 2016-09-22 (×7): 6 via TOPICAL

## 2016-09-15 MED ORDER — HEPARIN SODIUM (PORCINE) 5000 UNIT/ML IJ SOLN
5000.0000 [IU] | Freq: Three times a day (TID) | INTRAMUSCULAR | Status: DC
  Administered 2016-09-15 – 2016-09-17 (×9): 5000 [IU] via SUBCUTANEOUS
  Filled 2016-09-15 (×9): qty 1

## 2016-09-15 MED ORDER — LEVOTHYROXINE SODIUM 100 MCG PO TABS
300.0000 ug | ORAL_TABLET | Freq: Every day | ORAL | Status: DC
  Administered 2016-09-15 – 2016-09-22 (×8): 300 ug via ORAL
  Filled 2016-09-15 (×8): qty 3

## 2016-09-15 MED ORDER — SODIUM CHLORIDE 0.9% FLUSH
10.0000 mL | INTRAVENOUS | Status: DC | PRN
  Administered 2016-09-19: 30 mL
  Filled 2016-09-15: qty 40

## 2016-09-15 MED ORDER — SODIUM CHLORIDE 0.9% FLUSH
10.0000 mL | Freq: Two times a day (BID) | INTRAVENOUS | Status: DC
  Administered 2016-09-15 – 2016-09-21 (×4): 10 mL

## 2016-09-15 NOTE — Care Management Note (Signed)
Case Management Note  Patient Details  Name: Carnesha Maravilla MRN: [id number] Date of Birth: 01/16/1956  Subjective/Objective:         Fall with ankle and lower leg fractures            Action/Plan: Date:  Sep 15, 2016  Chart reviewed for concurrent status and case management needs.  Will continue to follow patient progress.  Discharge Planning: following for needs  Expected discharge date: [id number]  Velva Harman, BSN, [place], [place]   Expected Discharge Date:                  Expected Discharge Plan:  [place]  In-House Referral:     Discharge planning Services  CM Consult  Post Acute Care Choice:    Choice offered to:     DME Arranged:    DME Agency:     HH Arranged:    [place] Agency:     Status of Service:  Completed, signed off  If discussed at [employer] of Stay Meetings, dates discussed:    Additional Comments:  Leeroy Cha, RN 09/15/2016, 9:04 AM

## 2016-09-15 NOTE — Progress Notes (Signed)
PHARMACY - PHYSICIAN COMMUNICATION CRITICAL VALUE ALERT - BLOOD CULTURE IDENTIFICATION (BCID)  Results for orders placed or performed during the hospital encounter of 09/14/16  Blood Culture ID Panel (Reflexed) (Collected: 09/14/2016  8:11 PM)  Result Value Ref Range   Enterococcus species NOT DETECTED NOT DETECTED   Listeria monocytogenes NOT DETECTED NOT DETECTED   Staphylococcus species NOT DETECTED NOT DETECTED   Staphylococcus aureus NOT DETECTED NOT DETECTED   Streptococcus species NOT DETECTED NOT DETECTED   Streptococcus agalactiae NOT DETECTED NOT DETECTED   Streptococcus pneumoniae NOT DETECTED NOT DETECTED   Streptococcus pyogenes NOT DETECTED NOT DETECTED   Acinetobacter baumannii NOT DETECTED NOT DETECTED   Enterobacteriaceae species NOT DETECTED NOT DETECTED   Enterobacter cloacae complex NOT DETECTED NOT DETECTED   Escherichia coli NOT DETECTED NOT DETECTED   Klebsiella oxytoca NOT DETECTED NOT DETECTED   Klebsiella pneumoniae NOT DETECTED NOT DETECTED   Proteus species NOT DETECTED NOT DETECTED   Serratia marcescens NOT DETECTED NOT DETECTED   Haemophilus influenzae NOT DETECTED NOT DETECTED   Neisseria meningitidis NOT DETECTED NOT DETECTED   Pseudomonas aeruginosa NOT DETECTED NOT DETECTED   Candida albicans NOT DETECTED NOT DETECTED   Candida glabrata NOT DETECTED NOT DETECTED   Candida krusei NOT DETECTED NOT DETECTED   Candida parapsilosis NOT DETECTED NOT DETECTED   Candida tropicalis NOT DETECTED NOT DETECTED    Name of physician (or Provider) Contacted: Dr. Jerilee Hoh  Changes to prescribed antibiotics required: continue ceftriaxone pending final identification (and susceptibilities)  Clovis Riley 09/15/2016  6:03 PM

## 2016-09-15 NOTE — Progress Notes (Addendum)
PROGRESS NOTE    Kerri Vasquez  FOY:[id number] DOB: 18-Jan-1956 DOA: 09/14/2016 PCP: [name], MD     Brief Narrative:  61 y/o woman admitted from home on 5/17 due to pain of her left leg following a fall 1.5 weeks ago. XRays show left ankle fracture involving the distal tibia and fibula. She was admitted to the ICU due to sepsis 2/2 UTI.   Assessment & Plan:   Principal Problem:   Sepsis ([place]) Active Problems:   Sleep apnea   Breast cancer metastasized to liver [facility])   Takotsubo syndrome   Acute lower UTI   Sepsis -Due to UTI. -Continue rocephin pending cx data. -Sepsis parameters (hypotension, tachycardia) have resolved.  Left Ankle Fracture -Dr. Berenice Primas has been notified by my partner and will see patient in consultation for further recommendations.  H/o Metastatic Breast Cancer -Last received chemo about 2 months ago. -Has lymphedema of the right arm. -Continue OP follow up with oncology as scheduled.  Hypothyroidism -TSH elevated at 5.6. -Will increase synthroid from 275 to 300 mcg daily. -Will need repeat thyroid functions tests in 4-6 weeks.  Stage III CKD -Baseline Cr is around 1.5-1.7. -Currently at baseline. -No indications for dialysis.  Elevated D Dimer -Believe this can be explained by her sepsis. -Will DC VQ scan and IV heparin. -She has not had CP/SOB or any other symptoms that could be attributed to a PE. Tachycardia and hypotension are likely from sepsis and have resolved with fluid administration.  Stage IV Sacral Decubitus Ulcer -Does not appear infected. -Will request wound care consultation for further care instructions.  H/o Takatsubo Cardiomyopathy -Last ED in 11/17 was 76-72%, grade 1 diastolic dysfunction. -Minimally elevated troponin. Likely from sepsis. -No further cardiac work up planned for at this time. -Will continue to cycle troponins.   DVT prophylaxis: SQ heaprin Code Status: full code Family  Communication: patient only Disposition Plan: transfer to floor  Consultants:   Orthopedics, Dr. Berenice Primas  Procedures:   None  Antimicrobials:  Anti-infectives    Start     Dose/Rate Route Frequency Ordered Stop   09/15/16 2200  cefTRIAXone (ROCEPHIN) 2 g in dextrose 5 % 50 mL IVPB     2 g 100 mL/hr over 30 Minutes Intravenous Every 24 hours 09/14/16 2150     09/14/16 2145  cefTRIAXone (ROCEPHIN) 2 g in dextrose 5 % 50 mL IVPB     2 g 100 mL/hr over 30 Minutes Intravenous  Once 09/14/16 2143 09/14/16 2235       Subjective: States she feels "ok". No CP or SOB. Still some mild pain of her left lower leg.  Objective: Vitals:   09/15/16 0315 09/15/16 0330 09/15/16 0345 09/15/16 0400  BP: (!) 133/91 (!) 148/88 137/61 (!) 120/47  Pulse:      Resp: 18 19 (!) 23 13  Temp:      TempSrc:      SpO2: 97% 96% 96% 96%  Weight:    128.6 kg (283 lb 8.2 oz)  Height:    5\' 6"  (1.676 m)    Intake/Output Summary (Last 24 hours) at 09/15/16 0801 Last data filed at 09/15/16 0400  Gross per 24 hour  Intake           385.17 ml  Output                0 ml  Net           385.17 ml   [employer]  09/14/16 1806 09/15/16 0400  Weight: 130.6 kg (288 lb) 128.6 kg (283 lb 8.2 oz)    Examination:  General exam: Alert, awake, oriented x 3, morbidly obese Respiratory system: Clear to auscultation. Respiratory effort normal. Cardiovascular system:RRR. No murmurs, rubs, gallops. Gastrointestinal system: Abdomen is nondistended, soft and nontender. No organomegaly or masses felt. Normal bowel sounds heard. Central nervous system: Alert and oriented. No focal neurological deficits. Extremities: No C/C/E, +pedal pulses, left leg splinted in ED Skin: Large sacral decubitus ulcer.     Psychiatry: Judgement and insight appear normal. Mood & affect appropriate.     Data Reviewed: I have personally reviewed following labs and imaging studies  CBC:  Recent Labs Lab 09/14/16 1952  09/15/16 0337  WBC 13.3* 10.1  NEUTROABS 12.2*  --   HGB 14.0 14.2  HCT 41.5 42.5  MCV 98.1 98.8  PLT 216 086   Basic Metabolic Panel:  Recent Labs Lab 09/14/16 1952 09/15/16 0337  NA 139 138  K 4.4 4.7  CL 109 110  CO2 17* 14*  GLUCOSE 72 73  BUN 66* 58*  CREATININE 1.70* 1.51*  CALCIUM 8.8* 8.4*   GFR: Estimated Creatinine Clearance: 53.1 mL/min (A) (by C-G formula based on SCr of 1.51 mg/dL (H)). Liver Function Tests: No results for input(s): AST, ALT, ALKPHOS, BILITOT, PROT, ALBUMIN in the last 168 hours. No results for input(s): LIPASE, AMYLASE in the last 168 hours. No results for input(s): AMMONIA in the last 168 hours. Coagulation Profile:  Recent Labs Lab 09/15/16 0030  INR 1.22   Cardiac Enzymes:  Recent Labs Lab 09/15/16 0030  TROPONINI 0.04*   BNP (last 3 results) No results for input(s): PROBNP in the last 8760 hours. HbA1C: No results for input(s): HGBA1C in the last 72 hours. CBG: No results for input(s): GLUCAP in the last 168 hours. Lipid Profile: No results for input(s): CHOL, HDL, LDLCALC, TRIG, CHOLHDL, LDLDIRECT in the last 72 hours. Thyroid Function Tests:  Recent Labs  09/15/16 0030  TSH 5.669*   Anemia Panel: No results for input(s): VITAMINB12, FOLATE, FERRITIN, TIBC, IRON, RETICCTPCT in the last 72 hours. Urine analysis:    Component Value Date/Time   COLORURINE AMBER (A) 09/14/2016 2011   APPEARANCEUR CLOUDY (A) 09/14/2016 2011   LABSPEC 1.020 09/14/2016 2011   PHURINE 5.0 09/14/2016 2011   GLUCOSEU NEGATIVE 09/14/2016 2011   HGBUR LARGE (A) 09/14/2016 2011   BILIRUBINUR NEGATIVE 09/14/2016 2011   BILIRUBINUR negative 05/24/2011 1630   KETONESUR 20 (A) 09/14/2016 2011   PROTEINUR 100 (A) 09/14/2016 2011   UROBILINOGEN 0.2 08/26/2014 1850   NITRITE POSITIVE (A) 09/14/2016 2011   LEUKOCYTESUR LARGE (A) 09/14/2016 2011   Sepsis Labs: @LABRCNTIP (procalcitonin:4,lacticidven:4)  ) Recent Results (from the past 240  hour(s))  MRSA PCR Screening     Status: None   Collection Time: 09/15/16  2:55 AM  Result Value Ref Range Status   MRSA by PCR NEGATIVE NEGATIVE Final    Comment:        The GeneXpert MRSA Assay (FDA approved for NASAL specimens only), is one component of a comprehensive MRSA colonization surveillance program. It is not intended to diagnose MRSA infection nor to guide or monitor treatment for MRSA infections.          Radiology Studies: Dg Ankle Complete Left  Result Date: 09/14/2016 CLINICAL DATA:  Fall approximately 1 week ago EXAM: LEFT ANKLE COMPLETE - 3+ VIEW COMPARISON:  None. FINDINGS: Bones are osteopenic which limits the exam. Suspect subtle  linear nondisplaced fracture through the distal fibular malleolus. There may be subtle posterior malleolar fracture of the distal tibia as well. There is a fracture through the distal, medial tibia with extension of fracture lucency to the superior, medial articular surface of the ankle joint. Minimal medial displacement of distal fracture fragment. There is soft tissue swelling. Questionable osteochondral lesion lateral talar dome on the oblique view. IMPRESSION: 1. Bones appear osteopenic which limits the exam 2. Slightly displaced and mildly comminuted intra-articular fracture of the distal tibia and medial malleolus. Questionable posterior malleolar fracture. 3. Suspect nondisplaced distal fibular fracture. 4. Possible small osteochondral defect lateral talar dome on one view. Electronically Signed   By: Donavan Foil M.D.   On: 09/14/2016 19:01   Ct Head Wo Contrast  Result Date: 09/15/2016 CLINICAL DATA:  Fall 1 week ago EXAM: CT HEAD WITHOUT CONTRAST TECHNIQUE: Contiguous axial images were obtained from the base of the skull through the vertex without intravenous contrast. COMPARISON:  Brain MRI 02/06/2016 FINDINGS: Brain: No mass lesion, intraparenchymal hemorrhage or extra-axial collection. No evidence of acute cortical infarct.  Brain parenchyma and CSF-containing spaces are normal for age. Vascular: No hyperdense vessel or unexpected calcification. Skull: Normal visualized skull base, calvarium and extracranial soft tissues. Sinuses/Orbits: No sinus fluid levels or advanced mucosal thickening. No mastoid effusion. Normal orbits. IMPRESSION: Normal head CT for age. Electronically Signed   By: Ulyses Jarred M.D.   On: 09/15/2016 00:20   Dg Knee Complete 4 Views Left  Result Date: 09/14/2016 CLINICAL DATA:  Fall a week ago.  Pain and swelling. EXAM: LEFT KNEE - COMPLETE 4+ VIEW COMPARISON:  None. FINDINGS: Callus formation associated with the proximal fibula is consistent with a remote healed fracture. Mild degenerative changes. No joint effusion. No acute fracture seen within the knee. IMPRESSION: No acute fracture seen within the knee. Remote healed proximal fibular fracture. Electronically Signed   By: Dorise Bullion III M.D   On: 09/14/2016 18:59        Scheduled Meds: . fentaNYL  100 mcg Transdermal Q48H  . FLUoxetine  40 mg Oral QAC breakfast  . heparin subcutaneous  5,000 Units Subcutaneous Q8H  . levothyroxine  200 mcg Oral Q breakfast  . levothyroxine  75 mcg Oral QAC breakfast  . lipase/protease/amylase  24,000 Units Oral TID AC  . multivitamin with minerals  1 tablet Oral Daily  . pantoprazole  40 mg Oral BID  . pregabalin  200 mg Oral TID   Continuous Infusions: . sodium chloride 100 mL/hr at 09/15/16 0030  . cefTRIAXone (ROCEPHIN)  IV       LOS: 1 day    Time spent: 35 minutes. Greater than 50% of this time was spent in direct contact with the patient coordinating care.     Lelon Frohlich, MD [facility] Pager [phone]  If 7PM-7AM, please contact night-coverage [email or web address] Password [email or web address] 09/15/2016, 8:01 AM

## 2016-09-15 NOTE — Consult Note (Signed)
Reason for Consult:right distal tibial intra-articular fracture Referring Physician: Hospital loss  Kerri Vasquez is an 61 y.o. female.  HPI: the patient is a 61 year old female with significant  And severe multiple medical problems who fell approximately a week and a half ago.  She has been unable to walk since that time.  She finally sought medical help last night and was admitted by the hospitalist with multiple and significant medical issues including concerns for a lower extremity DVT.  She has been started on a heparin drip.  We're consult for evaluation and management of her distal tibia intra-articular fracture.  Past Medical History:  Diagnosis Date  . Anemia   . Breast cancer, right breast ([place]) dx'd 2008   right  . CHF (congestive heart failure) ([place])   . Chronic lower back pain   . Depression   . Diabetes mellitus    type II, controlled by diet 05/25/11   . Fibromyalgia   . Fracture of multiple toes    right toes x 4 toes- excluding small toe  . GERD (gastroesophageal reflux disease)   . Hypertension   . Hypothyroidism   . Incisional hernia   . Intestinal obstruction ([place])   . Iron deficiency anemia due to chronic blood loss 07/29/2015  . 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 Migraine    "weekly; but they don't last long" (08/26/2014)  . Myocardial infarction ([place]) 06/2014   S/P percutaneous intervention procedure a metastatic lesion in the liver  . Nausea and vomiting 04/2015  . Osteoarthritis of multiple joints   . Pernicious anemia 05/30/2011   Iron transfusion and VB12 on 06/06/11  . Renal insufficiency    hx renal failure 2011  . Sciatica   . Scoliosis   . Septic arthritis ([place]) 01/28/10   and spinal stenosis , moderate scoliosis   . Sleep apnea    hx of off machine x 4 years   . Spinal stenosis   . Thyroid cancer ([place]) 2013    Past Surgical History:  Procedure Laterality Date  . BREAST BIOPSY Right 2008  . BREAST  IMPLANT REMOVAL Left 2015  . BREAST RECONSTRUCTION Bilateral 2008; 2009   "w/the mastectomy; "  . BREAST RECONSTRUCTION Left ~ 2010   "it capsized"  . CARDIAC CATHETERIZATION    . CHOLECYSTECTOMY OPEN  06/29/85  . COLECTOMY  2011   "SBO"  . GASTRIC BYPASS  2006   "w/scope"  . IR GENERIC HISTORICAL  05/13/2014   IR RADIOLOGIST EVAL & MGMT 05/13/2014 Arne Cleveland, MD [facility] INTERV RAD  . IRRIGATION AND DEBRIDEMENT ABSCESS N/A 03/17/2016   Procedure: IRRIGATION AND DEBRIDIMENT SACRAL DECUBITUS ULCER;  Surgeon: Armandina Gemma, MD;  Location: [facility] ORS;  Service: General;  Laterality: N/A;  . LEFT HEART CATHETERIZATION WITH CORONARY ANGIOGRAM N/A 07/14/2014   Procedure: LEFT HEART CATHETERIZATION WITH CORONARY ANGIOGRAM;  Surgeon: Leonie Man, MD;  Location[facility] C[facility] LAB;  Service: Cardiovascular;  Laterality: N/A;  . LIVER BIOPSY  04/2015  . MASTECTOMY Bilateral 2008  . MULTIPLE EXTRACTIONS WITH ALVEOLOPLASTY  06/08/2011   Procedure: MULTIPLE EXTRACION WITH ALVEOLOPLASTY;  Surgeon: Lenn Cal, DDS;  Location: [facility] ORS;  Service: Oral Surgery;  Laterality: N/A;  Extraction of tooth #'s 3,4,7 with alveoloplasty and Gross Debridement of Remaining Teeth  . [place] LIVER TUMOR  2011; 01/26/2012  . THYROID LOBECTOMY  02/16/2012   Procedure: THYROID LOBECTOMY;  Surgeon: Earnstine Regal, MD;  Location: [facility];  Service: General;  Laterality: Right;  . TONSILLECTOMY  1969    Family History  Problem Relation Age of Onset  . Cancer Mother        breast  . Hypertension Mother   . Anesthesia problems Daughter     Social History:  reports that she has never smoked. She has never used smokeless tobacco. She reports that she does not drink alcohol or use drugs.  Allergies:  Allergies  Allergen Reactions  . Influenza Vac Split [Flu Virus Vaccine] Other (See Comments)    Joint stiffness and renal failure  . Pneumococcal Vaccines Other (See Comments)    "sepsis and renal failure"  .  Ritalin [Methylphenidate Hcl]     "Heart attack"  . Zostavax [Zoster Vaccine Live ([employer])] Other (See Comments)    Joint stiffness, renal failure  . Albumin (Human)     Updating patient's allergy by adding albumin human so that medication interactions tied to albumin would fire. Tied to [id number] and [id number]. Patient's medication interactions were not firing because the albumin value below (06/26/2014) was not causing any medication interaction as [employer] had changed it to a bulk chemical rather than a drug ingredient.   . Adhesive [Tape] Itching and Rash    Medications: I have reviewed the patient's current medications.  Results for orders placed or performed during the hospital encounter of 09/14/16 (from the past 48 hour(s))  CBC with Differential     Status: Abnormal   Collection Time: 09/14/16  7:52 PM  Result Value Ref Range   WBC 13.3 (H) 4.0 - 10.5 K/uL   RBC 4.23 3.87 - 5.11 MIL/uL   Hemoglobin 14.0 12.0 - 15.0 g/dL   HCT 41.5 36.0 - 46.0 %   MCV 98.1 78.0 - 100.0 fL   MCH 33.1 26.0 - 34.0 pg   MCHC 33.7 30.0 - 36.0 g/dL   RDW 17.2 (H) 11.5 - 15.5 %   Platelets 216 150 - 400 K/uL   Neutrophils Relative % 92 %   Neutro Abs 12.2 (H) 1.7 - 7.7 K/uL   Lymphocytes Relative 3 %   Lymphs Abs 0.4 (L) 0.7 - 4.0 K/uL   Monocytes Relative 4 %   Monocytes Absolute 0.6 0.1 - 1.0 K/uL   Eosinophils Relative 1 %   Eosinophils Absolute 0.1 0.0 - 0.7 K/uL   Basophils Relative 0 %   Basophils Absolute 0.0 0.0 - 0.1 K/uL  Basic metabolic panel     Status: Abnormal   Collection Time: 09/14/16  7:52 PM  Result Value Ref Range   Sodium 139 135 - 145 mmol/L   Potassium 4.4 3.5 - 5.1 mmol/L   Chloride 109 101 - 111 mmol/L   CO2 17 (L) 22 - 32 mmol/L   Glucose, Bld 72 65 - 99 mg/dL   BUN 66 (H) 6 - 20 mg/dL   Creatinine, Ser 1.70 (H) 0.44 - 1.00 mg/dL   Calcium 8.8 (L) 8.9 - 10.3 mg/dL   GFR calc non Af Amer 31 (L) >60 mL/min   GFR calc Af Amer 36 (L) >60 mL/min    Comment:  (NOTE) The eGFR has been calculated using the CKD EPI equation. This calculation has not been validated in all clinical situations. eGFR's persistently <60 mL/min signify possible Chronic Kidney Disease.    Anion gap 13 5 - 15  D-dimer, quantitative (not at [facility])     Status: Abnormal   Collection Time: 09/14/16  7:52 PM  Result Value Ref Range  D-Dimer, Quant 10.53 (H) 0.00 - 0.50 ug/mL-FEU    Comment: (NOTE) At the manufacturer cut-off of 0.50 ug/mL FEU, this assay has been documented to exclude PE with a sensitivity and negative predictive value of 97 to 99%.  At this time, this assay has not been approved by the FDA to exclude DVT/VTE. Results should be correlated with clinical presentation.   I-Stat CG4 Lactic Acid, ED     Status: Abnormal   Collection Time: 09/14/16  8:00 PM  Result Value Ref Range   Lactic Acid, Venous 2.82 (HH) 0.5 - 1.9 mmol/L   Comment NOTIFIED PHYSICIAN   Urinalysis, Routine w reflex microscopic     Status: Abnormal   Collection Time: 09/14/16  8:11 PM  Result Value Ref Range   Color, Urine AMBER (A) YELLOW    Comment: BIOCHEMICALS MAY BE AFFECTED BY COLOR   APPearance CLOUDY (A) CLEAR   Specific Gravity, Urine 1.020 1.005 - 1.030   pH 5.0 5.0 - 8.0   Glucose, UA NEGATIVE NEGATIVE mg/dL   Hgb urine dipstick LARGE (A) NEGATIVE   Bilirubin Urine NEGATIVE NEGATIVE   Ketones, ur 20 (A) NEGATIVE mg/dL   Protein, ur 100 (A) NEGATIVE mg/dL   Nitrite POSITIVE (A) NEGATIVE   Leukocytes, UA LARGE (A) NEGATIVE   RBC / HPF TOO NUMEROUS TO COUNT 0 - 5 RBC/hpf   WBC, UA TOO NUMEROUS TO COUNT 0 - 5 WBC/hpf   Bacteria, UA MANY (A) NONE SEEN   Squamous Epithelial / LPF 0-5 (A) NONE SEEN   WBC Clumps PRESENT    Mucous PRESENT    Non Squamous Epithelial 0-5 (A) NONE SEEN  APTT     Status: None   Collection Time: 09/15/16 12:30 AM  Result Value Ref Range   aPTT 25 24 - 36 seconds  Protime-INR     Status: Abnormal   Collection Time: 09/15/16 12:30 AM  Result  Value Ref Range   Prothrombin Time 15.4 (H) 11.4 - 15.2 seconds   INR 1.22   TSH     Status: Abnormal   Collection Time: 09/15/16 12:30 AM  Result Value Ref Range   TSH 5.669 (H) 0.350 - 4.500 uIU/mL    Comment: Performed by a 3rd Generation assay with a functional sensitivity of <=0.01 uIU/mL.  Troponin I     Status: Abnormal   Collection Time: 09/15/16 12:30 AM  Result Value Ref Range   Troponin I 0.04 (HH) <0.03 ng/mL    Comment: CRITICAL RESULT CALLED TO, READ BACK BY AND VERIFIED WITH: COUSAR,N RN 5.18.18 '@0132'  ZANDO,C   MRSA PCR Screening     Status: None   Collection Time: 09/15/16  2:55 AM  Result Value Ref Range   MRSA by PCR NEGATIVE NEGATIVE    Comment:        The GeneXpert MRSA Assay (FDA approved for NASAL specimens only), is one component of a comprehensive MRSA colonization surveillance program. It is not intended to diagnose MRSA infection nor to guide or monitor treatment for MRSA infections.   Basic metabolic panel     Status: Abnormal   Collection Time: 09/15/16  3:37 AM  Result Value Ref Range   Sodium 138 135 - 145 mmol/L   Potassium 4.7 3.5 - 5.1 mmol/L   Chloride 110 101 - 111 mmol/L   CO2 14 (L) 22 - 32 mmol/L   Glucose, Bld 73 65 - 99 mg/dL   BUN 58 (H) 6 - 20 mg/dL   Creatinine, Ser 1.51 (  H) 0.44 - 1.00 mg/dL   Calcium 8.4 (L) 8.9 - 10.3 mg/dL   GFR calc non Af Amer 36 (L) >60 mL/min   GFR calc Af Amer 42 (L) >60 mL/min    Comment: (NOTE) The eGFR has been calculated using the CKD EPI equation. This calculation has not been validated in all clinical situations. eGFR's persistently <60 mL/min signify possible Chronic Kidney Disease.    Anion gap 14 5 - 15  CBC     Status: Abnormal   Collection Time: 09/15/16  3:37 AM  Result Value Ref Range   WBC 10.1 4.0 - 10.5 K/uL   RBC 4.30 3.87 - 5.11 MIL/uL   Hemoglobin 14.2 12.0 - 15.0 g/dL   HCT 42.5 36.0 - 46.0 %   MCV 98.8 78.0 - 100.0 fL   MCH 33.0 26.0 - 34.0 pg   MCHC 33.4 30.0 - 36.0  g/dL   RDW 17.1 (H) 11.5 - 15.5 %   Platelets 184 150 - 400 K/uL   *Note: Due to a large number of results and/or encounters for the requested time period, some results have not been displayed. A complete set of results can be found in Results Review.    Dg Ankle Complete Left  Result Date: 09/14/2016 CLINICAL DATA:  Fall approximately 1 week ago EXAM: LEFT ANKLE COMPLETE - 3+ VIEW COMPARISON:  None. FINDINGS: Bones are osteopenic which limits the exam. Suspect subtle linear nondisplaced fracture through the distal fibular malleolus. There may be subtle posterior malleolar fracture of the distal tibia as well. There is a fracture through the distal, medial tibia with extension of fracture lucency to the superior, medial articular surface of the ankle joint. Minimal medial displacement of distal fracture fragment. There is soft tissue swelling. Questionable osteochondral lesion lateral talar dome on the oblique view. IMPRESSION: 1. Bones appear osteopenic which limits the exam 2. Slightly displaced and mildly comminuted intra-articular fracture of the distal tibia and medial malleolus. Questionable posterior malleolar fracture. 3. Suspect nondisplaced distal fibular fracture. 4. Possible small osteochondral defect lateral talar dome on one view. Electronically Signed   By: Donavan Foil M.D.   On: 09/14/2016 19:01   Ct Head Wo Contrast  Result Date: 09/15/2016 CLINICAL DATA:  Fall 1 week ago EXAM: CT HEAD WITHOUT CONTRAST TECHNIQUE: Contiguous axial images were obtained from the base of the skull through the vertex without intravenous contrast. COMPARISON:  Brain MRI 02/06/2016 FINDINGS: Brain: No mass lesion, intraparenchymal hemorrhage or extra-axial collection. No evidence of acute cortical infarct. Brain parenchyma and CSF-containing spaces are normal for age. Vascular: No hyperdense vessel or unexpected calcification. Skull: Normal visualized skull base, calvarium and extracranial soft tissues.  Sinuses/Orbits: No sinus fluid levels or advanced mucosal thickening. No mastoid effusion. Normal orbits. IMPRESSION: Normal head CT for age. Electronically Signed   By: Ulyses Jarred M.D.   On: 09/15/2016 00:20   Dg Knee Complete 4 Views Left  Result Date: 09/14/2016 CLINICAL DATA:  Fall a week ago.  Pain and swelling. EXAM: LEFT KNEE - COMPLETE 4+ VIEW COMPARISON:  None. FINDINGS: Callus formation associated with the proximal fibula is consistent with a remote healed fracture. Mild degenerative changes. No joint effusion. No acute fracture seen within the knee. IMPRESSION: No acute fracture seen within the knee. Remote healed proximal fibular fracture. Electronically Signed   By: Dorise Bullion III M.D   On: 09/14/2016 18:59    ROS  ROS: I have reviewed the patient's review of systems thoroughly and there  are no positive responses as relates to the HPI. Blood pressure (!) 120/47, pulse 98, temperature 99.4 F (37.4 C), temperature source Rectal, resp. rate 13, height '5\' 6"'  (1.676 m), weight 128.6 kg (283 lb 8.2 oz), last menstrual period 08/30/2006, SpO2 96 %. Physical Exam Well-developed well-nourished patient in no acute distress. Alert and oriented x3 HEENT:within normal limits Cardiac: Regular rate and rhythm Pulmonary: Lungs clear to auscultation Abdomen: Soft and nontender.  Normal active bowel sounds  Musculoskeletal: (ankle: Splinted and elevated on left side.  2 areas of pressure sores on left ankle.   Assessment/Plan: Medically compromised 61 year old female with a week and a half history of inability to walk related to right ankle pain.  X-ray showed displaced distal tibial intra-articular fracture.  There is question of a lateral malleolus fracture as well.//This is obviously a very complex situation.  The patient does require surgery to fix her intra-articular fracture.  Given the duration of symptoms I don't think this can be done percutaneously.  Obviously this would be the  first choice if she were able to be  Reduced satisfactorily.  I don't think that will be the case.  Given her overall severe medical situation  I think she will be best suited by stabilization over the weekend with elevation and compression of the distal tibia fracture.   We can stop her heparin or other anticoagulation prior to surgical intervention which should be on Monday.  Need aggressive elevation of bilat le to reduce pressure sores.  Oaklee Sunga L 09/15/2016, 7:12 AM

## 2016-09-15 NOTE — Progress Notes (Signed)
ANTICOAGULATION CONSULT NOTE - Initial Consult  Pharmacy Consult for IV heparin Indication: elevated d-dimer?  Allergies  Allergen Reactions  . Influenza Vac Split [Flu Virus Vaccine] Other (See Comments)    Joint stiffness and renal failure  . Pneumococcal Vaccines Other (See Comments)    "sepsis and renal failure"  . Ritalin [Methylphenidate Hcl]     "Heart attack"  . Zostavax [Zoster Vaccine Live ([employer])] Other (See Comments)    Joint stiffness, renal failure  . Albumin (Human)     Updating patient's allergy by adding albumin human so that medication interactions tied to albumin would fire. Tied to [id number] and [id number]. Patient's medication interactions were not firing because the albumin value below (06/26/2014) was not causing any medication interaction as FDB had changed it to a bulk chemical rather than a drug ingredient.   . Adhesive [Tape] Itching and Rash    Patient Measurements: Height: 5\' 6"  (167.6 cm) Weight: 283 lb 8.2 oz (128.6 kg) IBW/kg (Calculated) : 59.3 Heparin Dosing Weight: 80 kg  Vital Signs: Temp: 99.4 F (37.4 C) (05/17 1933) Temp Source: Rectal (05/17 1933) BP: 120/47 (05/18 0400) Pulse Rate: 98 (05/18 0134)  Labs:  Recent Labs  09/14/16 1952 09/15/16 0030 09/15/16 0337  HGB 14.0  --  14.2  HCT 41.5  --  42.5  PLT 216  --  184  APTT  --  25  --   LABPROT  --  15.4*  --   INR  --  1.22  --   CREATININE 1.70*  --  1.51*  TROPONINI  --  0.04*  --     Estimated Creatinine Clearance: 53.1 mL/min (A) (by C-G formula based on SCr of 1.51 mg/dL (H)).   Medical History: Past Medical History:  Diagnosis Date  . Anemia   . Breast cancer, right breast ([place]) dx'd 2008   right  . CHF (congestive heart failure) ([place])   . Chronic lower back pain   . Depression   . Diabetes mellitus    type II, controlled by diet 05/25/11   . Fibromyalgia   . Fracture of multiple toes    right toes x 4 toes- excluding small toe  . GERD  (gastroesophageal reflux disease)   . Hypertension   . Hypothyroidism   . Incisional hernia   . Intestinal obstruction ([place])   . Iron deficiency anemia due to chronic blood loss 07/29/2015  . 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 Migraine    "weekly; but they don't last long" (08/26/2014)  . Myocardial infarction ([place]) 06/2014   S/P percutaneous intervention procedure a metastatic lesion in the liver  . Nausea and vomiting 04/2015  . Osteoarthritis of multiple joints   . Pernicious anemia 05/30/2011   Iron transfusion and VB12 on 06/06/11  . Renal insufficiency    hx renal failure 2011  . Sciatica   . Scoliosis   . Septic arthritis ([place]) 01/28/10   and spinal stenosis , moderate scoliosis   . Sleep apnea    hx of off machine x 4 years   . Spinal stenosis   . Thyroid cancer ([place]) 2013    Medications:  Scheduled:  . fentaNYL  100 mcg Transdermal Q48H  . FLUoxetine  40 mg Oral QAC breakfast  . levothyroxine  200 mcg Oral Q breakfast  . levothyroxine  75 mcg Oral QAC breakfast  . lipase/protease/amylase  24,000 Units Oral TID AC  . multivitamin with minerals  1 tablet Oral Daily  . pantoprazole  40 mg Oral BID  . pregabalin  200 mg Oral TID   Infusions:  . sodium chloride 100 mL/hr at 09/15/16 0030  . cefTRIAXone (ROCEPHIN)  IV    . heparin 1,000 Units/hr (09/15/16 0029)    Assessment: 15 yoF s/p fall now with LE pain.  D-dimer=10.53 and Trop=0.04.  Patient was started on IV heparin in ED by PA at 1000 units/hr (no bolus)  IV heparin per Rx for elevated d-dimer? Goal of Therapy:  Heparin level 0.3-0.7 units/ml Monitor platelets by anticoagulation protocol: Yes   Plan:  Continue heparin drip at 1000 units/hr Check HL at 0630 today  Lawana Pai R 09/15/2016,4:50 AM

## 2016-09-15 NOTE — Consult Note (Signed)
[facility] Nurse wound consult note Reason for Consult: Pressure ulcer on sacrum Wound type:pressure, Stage IV, MARSI Pressure Injury POA: Yes Measurement: 6cm x 6cm x 5cm (difficult depth measurement due to in gluteal cleft Wound bed: 40% bone, 50% red granulating, bleeding tissue, 10% yellow slough Drainage (amount, consistency, odor) moderate, bloody, malodorous Periwound: intact except MARSI on upper left buttock, 2.5cm x 3cm x 0.1cm Dressing procedure/placement/frequency: I have provided nurses with orders for Cleanse with NS, pat dry, pack with fluffed NS moistened Kerlix, cover with dry gauze, use skin prep prior to taping dressing in place. 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 Pt is on a therapeutic mattress with low air loss feature and will require this support surface when transfers to floor.  Staff provided with guidance to minimize layers of bed linens.  Nutritional consult  Will be ordered if not already in place for increased requirements for tissue repair and regeneration.   We will follow along with you but not daily.  Please re-consult if needed in between visits.     Fara Olden, RN-C, WTA-C Wound Treatment Associate

## 2016-09-15 NOTE — Progress Notes (Signed)
Patient ID: Kerri Vasquez, female   DOB: [birth year], 61 y.o.   MRN: [id number] I have reviewed the patient's x-rays and we will evaluate the patient later this morning.  I would keep her n.p.o. Pending this evaluation.  My initial thoughts are that she will best be treated by surgical intervention after a period of medical stabilization but I will make this final determination after I see her this morning.  I have pended a consult note but this is for convenience and I will finalize the note once I have seen her this morning.

## 2016-09-16 DIAGNOSIS — N3001 Acute cystitis with hematuria: Secondary | ICD-10-CM

## 2016-09-16 LAB — CBC
HEMATOCRIT: 33.8 % — AB (ref 36.0–46.0)
Hemoglobin: 11 g/dL — ABNORMAL LOW (ref 12.0–15.0)
MCH: 32.4 pg (ref 26.0–34.0)
MCHC: 32.5 g/dL (ref 30.0–36.0)
MCV: 99.4 fL (ref 78.0–100.0)
PLATELETS: 206 10*3/uL (ref 150–400)
RBC: 3.4 MIL/uL — ABNORMAL LOW (ref 3.87–5.11)
RDW: 17.8 % — AB (ref 11.5–15.5)
WBC: 8.6 10*3/uL (ref 4.0–10.5)

## 2016-09-16 LAB — BASIC METABOLIC PANEL
Anion gap: 11 (ref 5–15)
BUN: 50 mg/dL — ABNORMAL HIGH (ref 6–20)
CO2: 17 mmol/L — ABNORMAL LOW (ref 22–32)
Calcium: 8.1 mg/dL — ABNORMAL LOW (ref 8.9–10.3)
Chloride: 113 mmol/L — ABNORMAL HIGH (ref 101–111)
Creatinine, Ser: 1.31 mg/dL — ABNORMAL HIGH (ref 0.44–1.00)
GFR calc Af Amer: 49 mL/min — ABNORMAL LOW (ref >60)
GFR calc non Af Amer: 43 mL/min — ABNORMAL LOW (ref >60)
Glucose, Bld: 85 mg/dL (ref 65–99)
Potassium: 3.5 mmol/L (ref 3.5–5.1)
Sodium: 141 mmol/L (ref 135–145)

## 2016-09-16 LAB — BLOOD CULTURE ID PANEL (REFLEXED)

## 2016-09-16 LAB — TROPONIN I: TROPONIN I: 0.03 ng/mL — AB (ref <0.03)

## 2016-09-16 NOTE — Progress Notes (Signed)
PROGRESS NOTE    Kerri Vasquez  FGH:[id number] DOB: Apr 13, 1956 DOA: 09/14/2016 PCP: [name], MD     Brief Narrative:  61 y/o woman admitted from home on 5/17 due to pain of her left leg following a fall 1.5 weeks ago. XRays show left ankle fracture involving the distal tibia and fibula. She was admitted to the ICU due to sepsis 2/2 UTI.   Assessment & Plan:   Principal Problem:   Sepsis ([place]) Active Problems:   Sleep apnea   Breast cancer metastasized to liver ([place])   Takotsubo syndrome   Acute lower UTI   Pressure injury of skin   Sepsis -Due to UTI. -Continue rocephin pending cx data. -Blood cx with GNR. -Urine cx with >100,000 E Coli. -Sepsis parameters (hypotension, tachycardia) have resolved.  Left Ankle Fracture -Ortho planning on surgery to repair fracture early next week.  H/o Metastatic Breast Cancer -Last received chemo about 2 months ago. -Has lymphedema of the right arm. -Continue OP follow up with oncology as scheduled.  Hypothyroidism -TSH elevated at 5.6. -Synthroid has been increased from 275 to 300 mcg daily. -Will need repeat thyroid functions tests in 4-6 weeks.  Stage III CKD -Baseline Cr is around 1.5-1.7. -Currently at baseline. -No indications for dialysis.  Elevated D Dimer -Believe this can be explained by her sepsis. -No need for r/o PE at this time. -She has not had CP/SOB or any other symptoms that could be attributed to a PE. Tachycardia and hypotension are likely from sepsis and have resolved with fluid administration.  Stage IV Sacral Decubitus Ulcer -Does not appear infected. -Appreciate wound care recommendations.  H/o Takatsubo Cardiomyopathy -Last ED in 11/17 was 67-89%, grade 1 diastolic dysfunction. -Minimally elevated troponin. Likely from sepsis. They have remained flat, no acute EKG changes. -No further cardiac work up planned for at this time.   DVT prophylaxis: SQ heaprin Code Status:  full code Family Communication: patient only Disposition Plan: await urine/blood cx and timing of orthopedic surgery.  Consultants:   Orthopedics, Dr. Berenice Primas  Procedures:   None  Antimicrobials:  Anti-infectives    Start     Dose/Rate Route Frequency Ordered Stop   09/15/16 2200  cefTRIAXone (ROCEPHIN) 2 g in dextrose 5 % 50 mL IVPB     2 g 100 mL/hr over 30 Minutes Intravenous Every 24 hours 09/14/16 2150     09/14/16 2145  cefTRIAXone (ROCEPHIN) 2 g in dextrose 5 % 50 mL IVPB     2 g 100 mL/hr over 30 Minutes Intravenous  Once 09/14/16 2143 09/14/16 2235       Subjective: No major complaints other than feeling weak.  Objective: Vitals:   09/15/16 1736 09/16/16 0500 09/16/16 0602 09/16/16 1415  BP: 120/88  (!) 100/50 112/70  Pulse: (!) 110  74 (!) 111  Resp: 16  18 16   Temp: 98.2 F (36.8 C)  98.1 F (36.7 C) 99.2 F (37.3 C)  TempSrc: Oral  Oral Oral  SpO2: 96%  99% 97%  Weight:  132.7 kg (292 lb 8.8 oz)    Height: 5\' 6"  (1.676 m)       Intake/Output Summary (Last 24 hours) at 09/16/16 1422 Last data filed at 09/16/16 0200  Gross per 24 hour  Intake              590 ml  Output              140 ml  Net  450 ml   Filed Weights   09/14/16 1806 09/15/16 0400 09/16/16 0500  Weight: 130.6 kg (288 lb) 128.6 kg (283 lb 8.2 oz) 132.7 kg (292 lb 8.8 oz)    Examination:  General exam: Alert, awake, oriented x 3 Respiratory system: Clear to auscultation. Respiratory effort normal. Cardiovascular system:RRR. No murmurs, rubs, gallops. Gastrointestinal system: Abdomen is nondistended, soft and nontender. No organomegaly or masses felt. Normal bowel sounds heard. Central nervous system: Alert and oriented. No focal neurological deficits. Extremities: No C/C/E, +pedal pulses Skin: Large sacral decubitus ulcer Psychiatry: Judgement and insight appear normal. Mood & affect appropriate.            Data Reviewed: I have personally reviewed  following labs and imaging studies  CBC:  Recent Labs Lab 09/14/16 1952 09/15/16 0337 09/16/16 0517  WBC 13.3* 10.1 8.6  NEUTROABS 12.2*  --   --   HGB 14.0 14.2 11.0*  HCT 41.5 42.5 33.8*  MCV 98.1 98.8 99.4  PLT 216 184 937   Basic Metabolic Panel:  Recent Labs Lab 09/14/16 1952 09/15/16 0337 09/16/16 0517  NA 139 138 141  K 4.4 4.7 3.5  CL 109 110 113*  CO2 17* 14* 17*  GLUCOSE 72 73 85  BUN 66* 58* 50*  CREATININE 1.70* 1.51* 1.31*  CALCIUM 8.8* 8.4* 8.1*   GFR: Estimated Creatinine Clearance: 62.3 mL/min (A) (by C-G formula based on SCr of 1.31 mg/dL (H)). Liver Function Tests: No results for input(s): AST, ALT, ALKPHOS, BILITOT, PROT, ALBUMIN in the last 168 hours. No results for input(s): LIPASE, AMYLASE in the last 168 hours. No results for input(s): AMMONIA in the last 168 hours. Coagulation Profile:  Recent Labs Lab 09/15/16 0030  INR 1.22   Cardiac Enzymes:  Recent Labs Lab 09/15/16 0030 09/15/16 1004 09/15/16 1842 09/16/16 0006  TROPONINI 0.04* 0.05* 0.03* 0.03*   BNP (last 3 results) No results for input(s): PROBNP in the last 8760 hours. HbA1C: No results for input(s): HGBA1C in the last 72 hours. CBG: No results for input(s): GLUCAP in the last 168 hours. Lipid Profile: No results for input(s): CHOL, HDL, LDLCALC, TRIG, CHOLHDL, LDLDIRECT in the last 72 hours. Thyroid Function Tests:  Recent Labs  09/15/16 0030  TSH 5.669*   Anemia Panel: No results for input(s): VITAMINB12, FOLATE, FERRITIN, TIBC, IRON, RETICCTPCT in the last 72 hours. Urine analysis:    Component Value Date/Time   COLORURINE AMBER (A) 09/14/2016 2011   APPEARANCEUR CLOUDY (A) 09/14/2016 2011   LABSPEC 1.020 09/14/2016 2011   PHURINE 5.0 09/14/2016 2011   GLUCOSEU NEGATIVE 09/14/2016 2011   HGBUR LARGE (A) 09/14/2016 2011   BILIRUBINUR NEGATIVE 09/14/2016 2011   BILIRUBINUR negative 05/24/2011 1630   KETONESUR 20 (A) 09/14/2016 2011   PROTEINUR 100  (A) 09/14/2016 2011   UROBILINOGEN 0.2 08/26/2014 1850   NITRITE POSITIVE (A) 09/14/2016 2011   LEUKOCYTESUR LARGE (A) 09/14/2016 2011   Sepsis Labs: @LABRCNTIP (procalcitonin:4,lacticidven:4)  ) Recent Results (from the past 240 hour(s))  Culture, blood (routine x 2)     Status: None (Preliminary result)   Collection Time: 09/14/16  8:11 PM  Result Value Ref Range Status   Specimen Description BLOOD LEFT ANTECUBITAL  Final   Special Requests   Final    BOTTLES DRAWN AEROBIC AND ANAEROBIC Blood Culture adequate volume   Culture  Setup Time   Final    GRAM NEGATIVE RODS AEROBIC BOTTLE ONLY D. ZEIGLER, [place] ([place]) AT 1696 ON 09/15/16 BY C. JESSUP, MLT.  Performed at [facility], [place] [address], [place], [place] [address]    Culture GRAM NEGATIVE RODS  Final   Report Status PENDING  Incomplete  Urine culture     Status: Abnormal (Preliminary result)   Collection Time: 09/14/16  8:11 PM  Result Value Ref Range Status   Specimen Description URINE, RANDOM  Final   Special Requests NONE  Final   Culture (A)  Final    >=100,000 COLONIES/mL ESCHERICHIA COLI SUSCEPTIBILITIES TO FOLLOW Performed at [facility], [address], [place], [place] [address]    Report Status PENDING  Incomplete  Blood Culture ID Panel (Reflexed)     Status: None   Collection Time: 09/14/16  8:11 PM  Result Value Ref Range Status   Enterococcus species NOT DETECTED NOT DETECTED Final   Listeria monocytogenes NOT DETECTED NOT DETECTED Final   Staphylococcus species NOT DETECTED NOT DETECTED Final   Staphylococcus aureus NOT DETECTED NOT DETECTED Final   Streptococcus species NOT DETECTED NOT DETECTED Final   Streptococcus agalactiae NOT DETECTED NOT DETECTED Final   Streptococcus pneumoniae NOT DETECTED NOT DETECTED Final   Streptococcus pyogenes NOT DETECTED NOT DETECTED Final   Acinetobacter baumannii NOT DETECTED NOT DETECTED Final   Enterobacteriaceae species NOT DETECTED NOT DETECTED  Final   Enterobacter cloacae complex NOT DETECTED NOT DETECTED Final   Escherichia coli NOT DETECTED NOT DETECTED Final   Klebsiella oxytoca NOT DETECTED NOT DETECTED Final   Klebsiella pneumoniae NOT DETECTED NOT DETECTED Final   Proteus species NOT DETECTED NOT DETECTED Final   Serratia marcescens NOT DETECTED NOT DETECTED Final   Haemophilus influenzae NOT DETECTED NOT DETECTED Final   Neisseria meningitidis NOT DETECTED NOT DETECTED Final   Pseudomonas aeruginosa NOT DETECTED NOT DETECTED Final   Candida albicans NOT DETECTED NOT DETECTED Final   Candida glabrata NOT DETECTED NOT DETECTED Final   Candida krusei NOT DETECTED NOT DETECTED Final   Candida parapsilosis NOT DETECTED NOT DETECTED Final   Candida tropicalis NOT DETECTED NOT DETECTED Final    Comment: Performed at [facility], [place] [address] [place], [place] [address]  Culture, blood (routine x 2)     Status: None (Preliminary result)   Collection Time: 09/15/16 12:30 AM  Result Value Ref Range Status   Specimen Description BLOOD RIGHT ARM  Final   Special Requests   Final    BOTTLES DRAWN AEROBIC AND ANAEROBIC Blood Culture adequate volume   Culture   Final    NO GROWTH < 12 HOURS Performed at [facility], [place] [address], [place],  [address]    Report Status PENDING  Incomplete  MRSA PCR Screening     Status: None   Collection Time: 09/15/16  2:55 AM  Result Value Ref Range Status   MRSA by PCR NEGATIVE NEGATIVE Final    Comment:        The GeneXpert MRSA Assay (FDA approved for NASAL specimens only), is one component of a comprehensive MRSA colonization surveillance program. It is not intended to diagnose MRSA infection nor to guide or monitor treatment for MRSA infections.          Radiology Studies: Dg Ankle Complete Left  Result Date: 09/14/2016 CLINICAL DATA:  Fall approximately 1 week ago EXAM: LEFT ANKLE COMPLETE - 3+ VIEW COMPARISON:  None. FINDINGS: Bones are osteopenic  which limits the exam. Suspect subtle linear nondisplaced fracture through the distal fibular malleolus. There may be subtle posterior malleolar fracture  of the distal tibia as well. There is a fracture through the distal, medial tibia with extension of fracture lucency to the superior, medial articular surface of the ankle joint. Minimal medial displacement of distal fracture fragment. There is soft tissue swelling. Questionable osteochondral lesion lateral talar dome on the oblique view. IMPRESSION: 1. Bones appear osteopenic which limits the exam 2. Slightly displaced and mildly comminuted intra-articular fracture of the distal tibia and medial malleolus. Questionable posterior malleolar fracture. 3. Suspect nondisplaced distal fibular fracture. 4. Possible small osteochondral defect lateral talar dome on one view. Electronically Signed   By: Donavan Foil M.D.   On: 09/14/2016 19:01   Ct Head Wo Contrast  Result Date: 09/15/2016 CLINICAL DATA:  Fall 1 week ago EXAM: CT HEAD WITHOUT CONTRAST TECHNIQUE: Contiguous axial images were obtained from the base of the skull through the vertex without intravenous contrast. COMPARISON:  Brain MRI 02/06/2016 FINDINGS: Brain: No mass lesion, intraparenchymal hemorrhage or extra-axial collection. No evidence of acute cortical infarct. Brain parenchyma and CSF-containing spaces are normal for age. Vascular: No hyperdense vessel or unexpected calcification. Skull: Normal visualized skull base, calvarium and extracranial soft tissues. Sinuses/Orbits: No sinus fluid levels or advanced mucosal thickening. No mastoid effusion. Normal orbits. IMPRESSION: Normal head CT for age. Electronically Signed   By: Ulyses Jarred M.D.   On: 09/15/2016 00:20   Dg Knee Complete 4 Views Left  Result Date: 09/14/2016 CLINICAL DATA:  Fall a week ago.  Pain and swelling. EXAM: LEFT KNEE - COMPLETE 4+ VIEW COMPARISON:  None. FINDINGS: Callus formation associated with the proximal fibula is  consistent with a remote healed fracture. Mild degenerative changes. No joint effusion. No acute fracture seen within the knee. IMPRESSION: No acute fracture seen within the knee. Remote healed proximal fibular fracture. Electronically Signed   By: Dorise Bullion III M.D   On: 09/14/2016 18:59        Scheduled Meds: . Chlorhexidine Gluconate Cloth  6 each Topical Daily  . feeding supplement (ENSURE ENLIVE)  237 mL Oral BID BM  . fentaNYL  100 mcg Transdermal Q48H  . FLUoxetine  40 mg Oral QAC breakfast  . heparin subcutaneous  5,000 Units Subcutaneous Q8H  . levothyroxine  300 mcg Oral QAC breakfast  . lipase/protease/amylase  24,000 Units Oral TID AC  . multivitamin with minerals  1 tablet Oral Daily  . pantoprazole  40 mg Oral BID  . pregabalin  200 mg Oral TID  . sodium chloride flush  10-40 mL Intracatheter Q12H   Continuous Infusions: . cefTRIAXone (ROCEPHIN)  IV Stopped (09/15/16 2355)     LOS: 2 days    Time spent: 35 minutes. Greater than 50% of this time was spent in direct contact with the patient coordinating care.     Lelon Frohlich, MD [facility] Pager [phone]  If 7PM-7AM, please contact night-coverage [email or web address] Password [email or web address] 09/16/2016, 2:22 PM

## 2016-09-16 NOTE — Progress Notes (Signed)
PHARMACY - PHYSICIAN COMMUNICATION CRITICAL VALUE ALERT - BLOOD CULTURE IDENTIFICATION (BCID)  Results for orders placed or performed during the hospital encounter of 09/14/16  Blood Culture ID Panel (Reflexed) (Collected: 09/14/2016  8:11 PM)  Result Value Ref Range   Enterococcus species NOT DETECTED NOT DETECTED   Listeria monocytogenes NOT DETECTED NOT DETECTED   Staphylococcus species DETECTED (A) NOT DETECTED   Staphylococcus aureus NOT DETECTED NOT DETECTED   Methicillin resistance NOT DETECTED NOT DETECTED   Streptococcus species NOT DETECTED NOT DETECTED   Streptococcus agalactiae NOT DETECTED NOT DETECTED   Streptococcus pneumoniae NOT DETECTED NOT DETECTED   Streptococcus pyogenes NOT DETECTED NOT DETECTED   Acinetobacter baumannii NOT DETECTED NOT DETECTED   Enterobacteriaceae species NOT DETECTED NOT DETECTED   Enterobacter cloacae complex NOT DETECTED NOT DETECTED   Escherichia coli NOT DETECTED NOT DETECTED   Klebsiella oxytoca NOT DETECTED NOT DETECTED   Klebsiella pneumoniae NOT DETECTED NOT DETECTED   Proteus species NOT DETECTED NOT DETECTED   Serratia marcescens NOT DETECTED NOT DETECTED   Haemophilus influenzae NOT DETECTED NOT DETECTED   Neisseria meningitidis NOT DETECTED NOT DETECTED   Pseudomonas aeruginosa NOT DETECTED NOT DETECTED   Candida albicans NOT DETECTED NOT DETECTED   Candida glabrata NOT DETECTED NOT DETECTED   Candida krusei NOT DETECTED NOT DETECTED   Candida parapsilosis NOT DETECTED NOT DETECTED   Candida tropicalis NOT DETECTED NOT DETECTED    Name of physician (or Provider) Contacted: Dr. Maudie Mercury  Changes to prescribed antibiotics required: continue current regimen  Royetta Asal, PharmD, BCPS Pager [phone] 09/16/2016 9:08 PM

## 2016-09-16 NOTE — Progress Notes (Signed)
PATIENT ID: Kerri Vasquez  MRN: [id number]  DOB/AGE:  [birth year] / 61 y.o.    Procedure(s) (LRB): OPEN REDUCTION INTERNAL FIXATION (ORIF) LEFT ANKLE FRACTURE (Left)    PROGRESS NOTE Subjective:   Patient is alert, oriented, no Nausea, no Vomiting, yes passing gas, no Bowel Movement. Taking PO with pt sipping water. Denies SOB, Chest or Calf Pain. Using Incentive Spirometer, PAS in place. Ambulate non weight bearing left leg, Patient reports pain as mild, though pt is drowsy and easily falls asleep as I am talking to her    Objective: Vital signs in last 24 hours: Temp:  [97.6 F (36.4 C)-98.2 F (36.8 C)] 98.1 F (36.7 C) (05/19 0602) Pulse Rate:  [74-110] 74 (05/19 0602) Resp:  [10-22] 18 (05/19 0602) BP: (100-148)/(50-105) 100/50 (05/19 0602) SpO2:  [93 %-99 %] 99 % (05/19 0602) Weight:  [132.7 kg (292 lb 8.8 oz)] 132.7 kg (292 lb 8.8 oz) (05/19 0500)    Intake/Output from previous day: I/O last 3 completed shifts: In: 1995.2 [P.O.:360; I.V.:1585.2; IV Piggyback:50] Out: 215 [Urine:215]   Intake/Output this shift: No intake/output data recorded.   LABORATORY DATA:  Recent Labs  09/15/16 0030 09/15/16 0337 09/16/16 0517  WBC  --  10.1 8.6  HGB  --  14.2 11.0*  HCT  --  42.5 33.8*  PLT  --  184 206  NA  --  138 141  K  --  4.7 3.5  CL  --  110 113*  CO2  --  14* 17*  BUN  --  58* 50*  CREATININE  --  1.51* 1.31*  GLUCOSE  --  73 85  INR 1.22  --   --   CALCIUM  --  8.4* 8.1*    Examination: Neurologically intact Neurovascular intact Sensation intact distally pt able to wiggle toes bilaterally}  Assessment:     Procedure(s) (LRB): OPEN REDUCTION INTERNAL FIXATION (ORIF) LEFT ANKLE FRACTURE (Left) ADDITIONAL DIAGNOSIS:  Diabetes, Sleep Apnea and CHF  Plan:  Non Weight Bearing (NWB) left leg  DVT Prophylaxis:  pt on heparin drip  Plan is for surgery Monday with Dr. Berenice Primas if pt stable and cleared by medicine.  Will plan for NPO after  midnight Sunday and stop heparin 6 hrs pre op.     Jasreet Dickie R 09/16/2016, 8:28 AM

## 2016-09-17 LAB — BLOOD GAS, ARTERIAL
ACID-BASE DEFICIT: 8.3 mmol/L — AB (ref 0.0–2.0)
Bicarbonate: 16.2 mmol/L — ABNORMAL LOW (ref 20.0–28.0)
DRAWN BY: 398981
FIO2: 21
O2 SAT: 94.1 %
PCO2 ART: 31.8 mmHg — AB (ref 32.0–48.0)
Patient temperature: 98.6
pH, Arterial: 7.327 — ABNORMAL LOW (ref 7.350–7.450)
pO2, Arterial: 78.1 mmHg — ABNORMAL LOW (ref 83.0–108.0)

## 2016-09-17 LAB — BASIC METABOLIC PANEL
Anion gap: 12 (ref 5–15)
BUN: 45 mg/dL — ABNORMAL HIGH (ref 6–20)
CALCIUM: 8 mg/dL — AB (ref 8.9–10.3)
CO2: 15 mmol/L — ABNORMAL LOW (ref 22–32)
CREATININE: 1.38 mg/dL — AB (ref 0.44–1.00)
Chloride: 110 mmol/L (ref 101–111)
GFR calc Af Amer: 46 mL/min — ABNORMAL LOW (ref >60)
GFR, EST NON AFRICAN AMERICAN: 40 mL/min — AB (ref >60)
GLUCOSE: 107 mg/dL — AB (ref 65–99)
Potassium: 3.2 mmol/L — ABNORMAL LOW (ref 3.5–5.1)
Sodium: 137 mmol/L (ref 135–145)

## 2016-09-17 LAB — CULTURE, BLOOD (ROUTINE X 2): SPECIAL REQUESTS: ADEQUATE

## 2016-09-17 LAB — CBC
HCT: 33.5 % — ABNORMAL LOW (ref 36.0–46.0)
Hemoglobin: 11.1 g/dL — ABNORMAL LOW (ref 12.0–15.0)
MCH: 32.8 pg (ref 26.0–34.0)
MCHC: 33.1 g/dL (ref 30.0–36.0)
MCV: 99.1 fL (ref 78.0–100.0)
PLATELETS: 199 10*3/uL (ref 150–400)
RBC: 3.38 MIL/uL — ABNORMAL LOW (ref 3.87–5.11)
RDW: 17.8 % — AB (ref 11.5–15.5)
WBC: 9.5 10*3/uL (ref 4.0–10.5)

## 2016-09-17 LAB — PROCALCITONIN: PROCALCITONIN: 1.1 ng/mL

## 2016-09-17 MED ORDER — DEXTROSE-NACL 5-0.45 % IV SOLN
INTRAVENOUS | Status: DC
  Administered 2016-09-18: 01:00:00 via INTRAVENOUS

## 2016-09-17 MED ORDER — SODIUM CHLORIDE 0.9 % IV SOLN
1.0000 g | Freq: Three times a day (TID) | INTRAVENOUS | Status: AC
  Administered 2016-09-17 – 2016-09-21 (×13): 1 g via INTRAVENOUS
  Filled 2016-09-17 (×14): qty 1

## 2016-09-17 MED ORDER — CHLORHEXIDINE GLUCONATE 4 % EX LIQD
60.0000 mL | Freq: Once | CUTANEOUS | Status: AC
  Administered 2016-09-18: 4 via TOPICAL
  Filled 2016-09-17: qty 60

## 2016-09-17 MED ORDER — DEXTROSE 5 % IV SOLN
3.0000 g | INTRAVENOUS | Status: DC
  Filled 2016-09-17 (×2): qty 3000

## 2016-09-17 MED ORDER — PRO-STAT SUGAR FREE PO LIQD
30.0000 mL | Freq: Two times a day (BID) | ORAL | Status: DC
  Administered 2016-09-17 – 2016-09-22 (×6): 30 mL via ORAL
  Filled 2016-09-17 (×7): qty 30

## 2016-09-17 NOTE — Progress Notes (Signed)
PATIENT ID: Kerri Vasquez  MRN: [id number]  DOB/AGE:  1956/02/13 / 61 y.o.    Procedure(s) (LRB): OPEN REDUCTION INTERNAL FIXATION (ORIF) LEFT ANKLE FRACTURE (Left)    PROGRESS NOTE Subjective:   Patient is alert, oriented, no Nausea, no Vomiting, yes passing gas, no Bowel Movement. Taking PO with small bites per patient. Denies SOB, Chest or Calf Pain. Using Incentive Spirometer, PAS in place. Ambulate - non weight bearing left lower extremity, Patient reports pain as moderate.    Objective: Vital signs in last 24 hours: Temp:  [97.7 F (36.5 C)-99.2 F (37.3 C)] 97.7 F (36.5 C) (05/20 0523) Pulse Rate:  [92-111] 92 (05/20 0523) Resp:  [16-18] 18 (05/20 0523) BP: (112-132)/(67-93) 119/67 (05/20 0523) SpO2:  [95 %-97 %] 95 % (05/20 0523) Weight:  [132 kg (291 lb 0.1 oz)] 132 kg (291 lb 0.1 oz) (05/20 0309)    Intake/Output from previous day: I/O last 3 completed shifts: In: 1180 [P.O.:1080; IV Piggyback:100] Out: 725 [Urine:725]   Intake/Output this shift: Total I/O In: 240 [P.O.:240] Out: -    LABORATORY DATA:  Recent Labs  09/15/16 0030  09/16/16 0517 09/17/16 0405  WBC  --   < > 8.6 9.5  HGB  --   < > 11.0* 11.1*  HCT  --   < > 33.8* 33.5*  PLT  --   < > 206 199  NA  --   < > 141 137  K  --   < > 3.5 3.2*  CL  --   < > 113* 110  CO2  --   < > 17* 15*  BUN  --   < > 50* 45*  CREATININE  --   < > 1.31* 1.38*  GLUCOSE  --   < > 85 107*  INR 1.22  --   --   --   CALCIUM  --   < > 8.1* 8.0*  < > = values in this interval not displayed.  Examination: Neurologically intact Neurovascular intact Sensation intact distally} Again patient follows commands and is able to wiggle toes.  Assessment:     Procedure(s) (LRB): OPEN REDUCTION INTERNAL FIXATION (ORIF) LEFT ANKLE FRACTURE (Left) ADDITIONAL DIAGNOSIS:   Diabetes, Sleep Apnea and CHF  Uti with sepsis - switched to Meropenem 1g IV q8h by pharmacist  Lia Hopping IV sacral ulcer  Plan:  Non Weight  Bearing (NWB)  DVT Prophylaxis:  heparin drip  Plan is for surgery Monday with Dr. Berenice Primas if pt stable and cleared by medicine.  Will plan for NPO after midnight Sunday and stop heparin 6 hrs pre op.      Ahlayah Tarkowski R 09/17/2016, 10:13 AM

## 2016-09-17 NOTE — Progress Notes (Signed)
Barrier cream applied to inguinal folds and under breasts. Turned and repositioned when pt allowed. Both LE's elevated. Left foot able to wiggle toes, and warm to touch.

## 2016-09-17 NOTE — Progress Notes (Signed)
PROGRESS NOTE    Kerri Vasquez  PFX:[id number] DOB: 1955/06/12 DOA: 09/14/2016 PCP: [name], MD     Brief Narrative:  61 y/o woman admitted from home on 5/17 due to pain of her left leg following a fall 1.5 weeks ago. XRays show left ankle fracture involving the distal tibia and fibula. She was admitted to the ICU due to sepsis 2/2 UTI. Cx with ESBL Ecoli, will DC rocephin and place on meropnem.   Assessment & Plan:   Principal Problem:   Sepsis ([place]) Active Problems:   Sleep apnea   Breast cancer metastasized to liver ([place])   Takotsubo syndrome   Acute lower UTI   Pressure injury of skin   Sepsis -Due to UTI. -Urine Cx with ESBL E Coli; will DC rocephin and place on meropenem -Blood Cx with GNR and CNS. -Sepsis parameters (hypotension, tachycardia) have resolved.  Left Ankle Fracture -Ortho planning on surgery to repair fracture early next week. -OK for surgical repair per sepsis standpoint.  H/o Metastatic Breast Cancer -Last received chemo about 2 months ago. -Has lymphedema of the right arm. -Continue OP follow up with oncology as scheduled.  Hypothyroidism -TSH elevated at 5.6. -Synthroid has been increased from 275 to 300 mcg daily. -Will need repeat thyroid functions tests in 4-6 weeks.  Stage III CKD -Baseline Cr is around 1.5-1.7. -Currently at baseline. -No indications for dialysis.  Elevated D Dimer -Believe this can be explained by her sepsis. -No need for r/o PE at this time. -She has not had CP/SOB or any other symptoms that could be attributed to a PE. Tachycardia and hypotension are likely from sepsis and have resolved with fluid administration.  Stage IV Sacral Decubitus Ulcer -Does not appear infected. -Appreciate wound care recommendations.  H/o Takatsubo Cardiomyopathy -Last ECHO in 11/17 was EF 32-99%, grade 1 diastolic dysfunction. -Minimally elevated troponin. Likely from sepsis. They have remained flat, no  acute EKG changes. -No further cardiac work up planned for at this time.   DVT prophylaxis: SQ heaprin Code Status: full code Family Communication: patient only Disposition Plan: await orthopedic repair of ankle fracture.  Consultants:   Orthopedics, Dr. Berenice Primas  Procedures:   None  Antimicrobials:  Anti-infectives    Start     Dose/Rate Route Frequency Ordered Stop   09/17/16 1000  meropenem (MERREM) 1 g in sodium chloride 0.9 % 100 mL IVPB     1 g 200 mL/hr over 30 Minutes Intravenous Every 8 hours 09/17/16 0859     09/15/16 2200  cefTRIAXone (ROCEPHIN) 2 g in dextrose 5 % 50 mL IVPB  Status:  Discontinued     2 g 100 mL/hr over 30 Minutes Intravenous Every 24 hours 09/14/16 2150 09/17/16 0853   09/14/16 2145  cefTRIAXone (ROCEPHIN) 2 g in dextrose 5 % 50 mL IVPB     2 g 100 mL/hr over 30 Minutes Intravenous  Once 09/14/16 2143 09/14/16 2235       Subjective: No major complaints other than feeling weak.  Objective: Vitals:   09/16/16 1415 09/16/16 2139 09/17/16 0309 09/17/16 0523  BP: 112/70 (!) 132/93  119/67  Pulse: (!) 111 99  92  Resp: 16 16  18   Temp: 99.2 F (37.3 C) 98.1 F (36.7 C)  97.7 F (36.5 C)  TempSrc: Oral Oral  Oral  SpO2: 97% 97%  95%  Weight:   132 kg (291 lb 0.1 oz)   Height:        Intake/Output Summary (  Last 24 hours) at 09/17/16 1216 Last data filed at 09/17/16 1025  Gross per 24 hour  Intake             1260 ml  Output              725 ml  Net              535 ml   Filed Weights   09/15/16 0400 09/16/16 0500 09/17/16 0309  Weight: 128.6 kg (283 lb 8.2 oz) 132.7 kg (292 lb 8.8 oz) 132 kg (291 lb 0.1 oz)    Examination:  General exam: Alert, awake, oriented x 3, sleepy but easily arousable. Respiratory system: Clear to auscultation. Respiratory effort normal. Cardiovascular system:RRR. No murmurs, rubs, gallops. Gastrointestinal system: Abdomen is nondistended, soft and nontender. No organomegaly or masses felt. Normal bowel  sounds heard. Central nervous system: Alert and oriented. No focal neurological deficits. Extremities: No C/C/E, +pedal pulses Skin: large sacral decubitus ulcer (see picture) Psychiatry: Judgement and insight appear normal. Mood & affect appropriate.             Data Reviewed: I have personally reviewed following labs and imaging studies  CBC:  Recent Labs Lab 09/14/16 1952 09/15/16 0337 09/16/16 0517 09/17/16 0405  WBC 13.3* 10.1 8.6 9.5  NEUTROABS 12.2*  --   --   --   HGB 14.0 14.2 11.0* 11.1*  HCT 41.5 42.5 33.8* 33.5*  MCV 98.1 98.8 99.4 99.1  PLT 216 184 206 702   Basic Metabolic Panel:  Recent Labs Lab 09/14/16 1952 09/15/16 0337 09/16/16 0517 09/17/16 0405  NA 139 138 141 137  K 4.4 4.7 3.5 3.2*  CL 109 110 113* 110  CO2 17* 14* 17* 15*  GLUCOSE 72 73 85 107*  BUN 66* 58* 50* 45*  CREATININE 1.70* 1.51* 1.31* 1.38*  CALCIUM 8.8* 8.4* 8.1* 8.0*   GFR: Estimated Creatinine Clearance: 59 mL/min (A) (by C-G formula based on SCr of 1.38 mg/dL (H)). Liver Function Tests: No results for input(s): AST, ALT, ALKPHOS, BILITOT, PROT, ALBUMIN in the last 168 hours. No results for input(s): LIPASE, AMYLASE in the last 168 hours. No results for input(s): AMMONIA in the last 168 hours. Coagulation Profile:  Recent Labs Lab 09/15/16 0030  INR 1.22   Cardiac Enzymes:  Recent Labs Lab 09/15/16 0030 09/15/16 1004 09/15/16 1842 09/16/16 0006  TROPONINI 0.04* 0.05* 0.03* 0.03*   BNP (last 3 results) No results for input(s): PROBNP in the last 8760 hours. HbA1C: No results for input(s): HGBA1C in the last 72 hours. CBG: No results for input(s): GLUCAP in the last 168 hours. Lipid Profile: No results for input(s): CHOL, HDL, LDLCALC, TRIG, CHOLHDL, LDLDIRECT in the last 72 hours. Thyroid Function Tests:  Recent Labs  09/15/16 0030  TSH 5.669*   Anemia Panel: No results for input(s): VITAMINB12, FOLATE, FERRITIN, TIBC, IRON, RETICCTPCT in the  last 72 hours. Urine analysis:    Component Value Date/Time   COLORURINE AMBER (A) 09/14/2016 2011   APPEARANCEUR CLOUDY (A) 09/14/2016 2011   LABSPEC 1.020 09/14/2016 2011   PHURINE 5.0 09/14/2016 2011   GLUCOSEU NEGATIVE 09/14/2016 2011   HGBUR LARGE (A) 09/14/2016 2011   BILIRUBINUR NEGATIVE 09/14/2016 2011   BILIRUBINUR negative 05/24/2011 1630   KETONESUR 20 (A) 09/14/2016 2011   PROTEINUR 100 (A) 09/14/2016 2011   UROBILINOGEN 0.2 08/26/2014 1850   NITRITE POSITIVE (A) 09/14/2016 2011   LEUKOCYTESUR LARGE (A) 09/14/2016 2011   Sepsis Labs: @LABRCNTIP (procalcitonin:4,lacticidven:4)  )  Recent Results (from the past 240 hour(s))  Culture, blood (routine x 2)     Status: Abnormal   Collection Time: 09/14/16  8:11 PM  Result Value Ref Range Status   Specimen Description BLOOD LEFT ANTECUBITAL  Final   Special Requests   Final    BOTTLES DRAWN AEROBIC AND ANAEROBIC Blood Culture adequate volume   Culture  Setup Time   Final    GRAM NEGATIVE RODS AEROBIC BOTTLE ONLY D. ZEIGLER, [place] ([place]) AT 3716 ON 09/15/16 BY C. JESSUP, MLT. GRAM POSITIVE COCCI IN CLUSTERS ANAEROBIC BOTTLE ONLY CRITICAL RESULT CALLED TO, READ BACK BY AND VERIFIED WITH: N GLOGOVAC,PHARMD AT 1951 09/16/16 BY L BENFIELD    Culture (A)  Final    ACINETOBACTER SPECIES STAPHYLOCOCCUS SPECIES (COAGULASE NEGATIVE) THE SIGNIFICANCE OF ISOLATING THIS ORGANISM FROM A SINGLE SET OF BLOOD CULTURES WHEN MULTIPLE SETS ARE DRAWN IS UNCERTAIN. PLEASE NOTIFY THE MICROBIOLOGY DEPARTMENT WITHIN ONE WEEK IF SPECIATION AND SENSITIVITIES ARE REQUIRED. Performed at [facility], [place] [address] [place], [place] [address]    Report Status 09/17/2016 FINAL  Final   Organism ID, Bacteria ACINETOBACTER SPECIES  Final      Susceptibility   Acinetobacter species - MIC*    CEFTAZIDIME <=1 SENSITIVE Sensitive     CEFTRIAXONE 8 SENSITIVE Sensitive     CIPROFLOXACIN <=0.25 SENSITIVE Sensitive     GENTAMICIN <=1 SENSITIVE  Sensitive     IMIPENEM <=0.25 SENSITIVE Sensitive     PIP/TAZO >=128 RESISTANT Resistant     TRIMETH/SULFA <=20 SENSITIVE Sensitive     CEFEPIME <=1 SENSITIVE Sensitive     AMPICILLIN/SULBACTAM <=2 SENSITIVE Sensitive     * ACINETOBACTER SPECIES  Urine culture     Status: Abnormal (Preliminary result)   Collection Time: 09/14/16  8:11 PM  Result Value Ref Range Status   Specimen Description URINE, RANDOM  Final   Special Requests NONE  Final   Culture (A)  Final    >=100,000 COLONIES/mL ESCHERICHIA COLI Confirmed Extended Spectrum Beta-Lactamase Producer (ESBL) >=100,000 COLONIES/mL KLEBSIELLA OXYTOCA SUSCEPTIBILITIES TO FOLLOW Performed at [facility], [place] [address] [place], [place] [address]    Report Status PENDING  Incomplete   Organism ID, Bacteria ESCHERICHIA COLI (A)  Final      Susceptibility   Escherichia coli - MIC*    AMPICILLIN >=32 RESISTANT Resistant     CEFAZOLIN >=64 RESISTANT Resistant     CEFTRIAXONE >=64 RESISTANT Resistant     CIPROFLOXACIN >=4 RESISTANT Resistant     GENTAMICIN >=16 RESISTANT Resistant     IMIPENEM <=0.25 SENSITIVE Sensitive     NITROFURANTOIN <=16 SENSITIVE Sensitive     TRIMETH/SULFA >=320 RESISTANT Resistant     AMPICILLIN/SULBACTAM >=32 RESISTANT Resistant     PIP/TAZO 8 SENSITIVE Sensitive     Extended ESBL POSITIVE Resistant     * >=100,000 COLONIES/mL ESCHERICHIA COLI  Blood Culture ID Panel (Reflexed)     Status: None   Collection Time: 09/14/16  8:11 PM  Result Value Ref Range Status   Enterococcus species NOT DETECTED NOT DETECTED Final   Listeria monocytogenes NOT DETECTED NOT DETECTED Final   Staphylococcus species NOT DETECTED NOT DETECTED Final   Staphylococcus aureus NOT DETECTED NOT DETECTED Final   Streptococcus species NOT DETECTED NOT DETECTED Final   Streptococcus agalactiae NOT DETECTED NOT DETECTED Final   Streptococcus pneumoniae NOT DETECTED NOT DETECTED Final   Streptococcus pyogenes NOT DETECTED NOT  DETECTED Final   Acinetobacter baumannii NOT DETECTED NOT  DETECTED Final   Enterobacteriaceae species NOT DETECTED NOT DETECTED Final   Enterobacter cloacae complex NOT DETECTED NOT DETECTED Final   Escherichia coli NOT DETECTED NOT DETECTED Final   Klebsiella oxytoca NOT DETECTED NOT DETECTED Final   Klebsiella pneumoniae NOT DETECTED NOT DETECTED Final   Proteus species NOT DETECTED NOT DETECTED Final   Serratia marcescens NOT DETECTED NOT DETECTED Final   Haemophilus influenzae NOT DETECTED NOT DETECTED Final   Neisseria meningitidis NOT DETECTED NOT DETECTED Final   Pseudomonas aeruginosa NOT DETECTED NOT DETECTED Final   Candida albicans NOT DETECTED NOT DETECTED Final   Candida glabrata NOT DETECTED NOT DETECTED Final   Candida krusei NOT DETECTED NOT DETECTED Final   Candida parapsilosis NOT DETECTED NOT DETECTED Final   Candida tropicalis NOT DETECTED NOT DETECTED Final    Comment: Performed at [facility], [place] [address] [place], [place] [address]  Blood Culture ID Panel (Reflexed)     Status: Abnormal   Collection Time: 09/14/16  8:11 PM  Result Value Ref Range Status   Enterococcus species NOT DETECTED NOT DETECTED Final   Listeria monocytogenes NOT DETECTED NOT DETECTED Final   Staphylococcus species DETECTED (A) NOT DETECTED Final    Comment: Methicillin (oxacillin) susceptible coagulase negative staphylococcus. Possible blood culture contaminant (unless isolated from more than one blood culture draw or clinical case suggests pathogenicity). No antibiotic treatment is indicated for blood  culture contaminants. CRITICAL RESULT CALLED TO, READ BACK BY AND VERIFIED WITH: N GLOGOVAC,PHARMD AT 1951 09/16/16 BY L BENFIELD    Staphylococcus aureus NOT DETECTED NOT DETECTED Final   Methicillin resistance NOT DETECTED NOT DETECTED Final   Streptococcus species NOT DETECTED NOT DETECTED Final   Streptococcus agalactiae NOT DETECTED NOT DETECTED Final   Streptococcus  pneumoniae NOT DETECTED NOT DETECTED Final   Streptococcus pyogenes NOT DETECTED NOT DETECTED Final   Acinetobacter baumannii NOT DETECTED NOT DETECTED Final   Enterobacteriaceae species NOT DETECTED NOT DETECTED Final   Enterobacter cloacae complex NOT DETECTED NOT DETECTED Final   Escherichia coli NOT DETECTED NOT DETECTED Final   Klebsiella oxytoca NOT DETECTED NOT DETECTED Final   Klebsiella pneumoniae NOT DETECTED NOT DETECTED Final   Proteus species NOT DETECTED NOT DETECTED Final   Serratia marcescens NOT DETECTED NOT DETECTED Final   Haemophilus influenzae NOT DETECTED NOT DETECTED Final   Neisseria meningitidis NOT DETECTED NOT DETECTED Final   Pseudomonas aeruginosa NOT DETECTED NOT DETECTED Final   Candida albicans NOT DETECTED NOT DETECTED Final   Candida glabrata NOT DETECTED NOT DETECTED Final   Candida krusei NOT DETECTED NOT DETECTED Final   Candida parapsilosis NOT DETECTED NOT DETECTED Final   Candida tropicalis NOT DETECTED NOT DETECTED Final    Comment: Performed at [facility], [address] [place], [place] [address]  Culture, blood (routine x 2)     Status: None (Preliminary result)   Collection Time: 09/15/16 12:30 AM  Result Value Ref Range Status   Specimen Description BLOOD RIGHT ARM  Final   Special Requests   Final    BOTTLES DRAWN AEROBIC AND ANAEROBIC Blood Culture adequate volume   Culture   Final    NO GROWTH 1 DAY Performed at [facility], [place] [address] [place],  [address]    Report Status PENDING  Incomplete  MRSA PCR Screening     Status: None   Collection Time: 09/15/16  2:55 AM  Result Value Ref Range Status   MRSA by PCR  NEGATIVE NEGATIVE Final    Comment:        The GeneXpert MRSA Assay (FDA approved for NASAL specimens only), is one component of a comprehensive MRSA colonization surveillance program. It is not intended to diagnose MRSA infection nor to guide or monitor treatment for MRSA infections.           Radiology Studies: No results found.      Scheduled Meds: . Chlorhexidine Gluconate Cloth  6 each Topical Daily  . feeding supplement (ENSURE ENLIVE)  237 mL Oral BID BM  . fentaNYL  100 mcg Transdermal Q48H  . FLUoxetine  40 mg Oral QAC breakfast  . heparin subcutaneous  5,000 Units Subcutaneous Q8H  . levothyroxine  300 mcg Oral QAC breakfast  . lipase/protease/amylase  24,000 Units Oral TID AC  . multivitamin with minerals  1 tablet Oral Daily  . pantoprazole  40 mg Oral BID  . pregabalin  200 mg Oral TID  . sodium chloride flush  10-40 mL Intracatheter Q12H   Continuous Infusions: . meropenem (MERREM) IV 1 g (09/17/16 1051)     LOS: 3 days    Time spent: 25 minutes. Greater than 50% of this time was spent in direct contact with the patient coordinating care.     Lelon Frohlich, MD [facility] Pager [phone]  If 7PM-7AM, please contact night-coverage [email or web address] Password [email or web address] 09/17/2016, 12:16 PM

## 2016-09-17 NOTE — Progress Notes (Signed)
Initial Nutrition Assessment  DOCUMENTATION CODES:   Morbid obesity  INTERVENTION:   -Continue Ensure Enlive po BID, each supplement provides 350 kcal and 20 grams of protein -Provide Magic cup BID with meals, each supplement provides 290 kcal and 9 grams of protein -Provide Prostat liquid protein PO 30 ml BID with meals, each supplement provides 100 kcal, 15 grams protein.  RD to continue to monitor  NUTRITION DIAGNOSIS:   Increased nutrient needs related to wound healing as evidenced by estimated needs.  GOAL:   Patient will meet greater than or equal to 90% of their needs  MONITOR:   PO intake, Supplement acceptance, Labs, Weight trends, Skin, I & O's  REASON FOR ASSESSMENT:   Consult Assessment of nutrition requirement/status  ASSESSMENT:   61 y/o woman admitted from home on 5/17 due to pain of her left leg following a fall 1.5 weeks ago. XRays show left ankle fracture involving the distal tibia and fibula. She was admitted to the ICU due to sepsis 2/2 UTI. Cx with ESBL Ecoli, will DC rocephin and place on meropnem.  Patient in room with no family at bedside. Pt with lunch meal at bedside. Pt states is finished. Pt consumed most a piece of grilled fish and none of her sides. Pt states she has been drinking Ensures provided. She is agreeable to trying other protein supplements as well, will order Prostat and Magic cups to provide additional calories and protein to aid in wound healing.  Per chart review, pt has gained weight. Pt is s/p gastric bypass in 2006. Nutrition focused physical exam shows no sign of depletion of muscle mass or body fat.  Medications: CREON capsule TID, Multivitamin with minerals daily, Protonix tablet BID,  Labs reviewed: Low K GFR: 40  Diet Order:  Diet Heart Room service appropriate? Yes; Fluid consistency: Thin  Skin:  Wound (see comment) (Stage IV sacral ulcer)  Last BM:  5/15  Height:   Ht Readings from Last 1 Encounters:  09/15/16  5\' 6"  (1.676 m)    Weight:   Wt Readings from Last 1 Encounters:  09/17/16 291 lb 0.1 oz (132 kg)    Ideal Body Weight:  59.1 kg  BMI:  Body mass index is 46.97 kg/m.  Estimated Nutritional Needs:   Kcal:  3568-6168  Protein:  110-120g  Fluid:  2L/day  EDUCATION NEEDS:   Education needs addressed  Clayton Bibles, MS, RD, LDN Pager: [phone] After Hours Pager: [phone]

## 2016-09-17 NOTE — Progress Notes (Signed)
Pharmacy Antibiotic Note  Kerri Vasquez is a 61 y.o. female admitted on 09/14/2016 with sepsis secondary to UTI. Patient initially placed on Ceftriaxone. However, urine culture now showing ESBL, so antibiotics changed to Meropenem. Blood cultures growing gram negative rods  In aerobic bottle of one set (1/4 bottles) and gram positive cocci in clusters in anaerobic bottle of same set (1/4 bottles).   Plan: Meropenem 1g IV q8h. Monitor renal function, cultures, clinical course.   Height: 5\' 6"  (167.6 cm) Weight: 291 lb 0.1 oz (132 kg) IBW/kg (Calculated) : 59.3  Temp (24hrs), Avg:98.3 F (36.8 C), Min:97.7 F (36.5 C), Max:99.2 F (37.3 C)   Recent Labs Lab 09/14/16 1952 09/14/16 2000 09/15/16 0337 09/15/16 1004 09/16/16 0517 09/17/16 0405  WBC 13.3*  --  10.1  --  8.6 9.5  CREATININE 1.70*  --  1.51*  --  1.31* 1.38*  LATICACIDVEN  --  2.82*  --  1.6  --   --     Estimated Creatinine Clearance: 59 mL/min (A) (by C-G formula based on SCr of 1.38 mg/dL (H)).    Allergies  Allergen Reactions  . Influenza Vac Split [Flu Virus Vaccine] Other (See Comments)    Joint stiffness and renal failure  . Pneumococcal Vaccines Other (See Comments)    "sepsis and renal failure"  . Ritalin [Methylphenidate Hcl]     "Heart attack"  . Zostavax [Zoster Vaccine Live ([employer])] Other (See Comments)    Joint stiffness, renal failure  . Albumin (Human)     Updating patient's allergy by adding albumin human so that medication interactions tied to albumin would fire. Tied to [id number] and [id number]. Patient's medication interactions were not firing because the albumin value below (06/26/2014) was not causing any medication interaction as [employer] had changed it to a bulk chemical rather than a drug ingredient.   . Adhesive [Tape] Itching and Rash    Antimicrobials this admission: 5/17 ceftriaxone >> 5/20 5/20 meropenem >>   Microbiology results: 5/17 BCx x 2: GNR in aerobic  bottle of one set (1/4 bottles) --> nothing detected on BCID, GPC in clusters in anaerobic bottle of one set (1/4 bottles) --> Staph species per BCID  5/17 UCx: > 100K ESBL E.coli 5/18 MRSA PCR: negative   Thank you for allowing pharmacy to be a part of this patient's care.    Lindell Spar, PharmD, BCPS Pager: [phone] 09/17/2016 9:12 AM

## 2016-09-18 ENCOUNTER — Inpatient Hospital Stay (HOSPITAL_COMMUNITY): Payer: Medicare Other

## 2016-09-18 ENCOUNTER — Other Ambulatory Visit: Payer: Self-pay

## 2016-09-18 ENCOUNTER — Encounter (HOSPITAL_COMMUNITY): Payer: Self-pay | Admitting: *Deleted

## 2016-09-18 ENCOUNTER — Encounter (HOSPITAL_COMMUNITY)
Admission: EM | Disposition: A | Discharge status: Home / Self Care | Payer: Self-pay | Source: Home / Self Care | Attending: Internal Medicine

## 2016-09-18 ENCOUNTER — Inpatient Hospital Stay (HOSPITAL_COMMUNITY): Payer: Medicare Other | Admitting: Anesthesiology

## 2016-09-18 ENCOUNTER — Other Ambulatory Visit: Payer: Medicare Other

## 2016-09-18 ENCOUNTER — Ambulatory Visit: Payer: Medicare Other | Admitting: Hematology & Oncology

## 2016-09-18 ENCOUNTER — Other Ambulatory Visit: Payer: Self-pay | Admitting: *Deleted

## 2016-09-18 DIAGNOSIS — G4701 Insomnia due to medical condition: Secondary | ICD-10-CM

## 2016-09-18 DIAGNOSIS — I150 Renovascular hypertension: Secondary | ICD-10-CM

## 2016-09-18 DIAGNOSIS — C787 Secondary malignant neoplasm of liver and intrahepatic bile duct: Principal | ICD-10-CM

## 2016-09-18 DIAGNOSIS — C50911 Malignant neoplasm of unspecified site of right female breast: Secondary | ICD-10-CM

## 2016-09-18 DIAGNOSIS — C50919 Malignant neoplasm of unspecified site of unspecified female breast: Secondary | ICD-10-CM

## 2016-09-18 DIAGNOSIS — N179 Acute kidney failure, unspecified: Secondary | ICD-10-CM

## 2016-09-18 DIAGNOSIS — D500 Iron deficiency anemia secondary to blood loss (chronic): Secondary | ICD-10-CM

## 2016-09-18 DIAGNOSIS — G8929 Other chronic pain: Secondary | ICD-10-CM

## 2016-09-18 DIAGNOSIS — M5440 Lumbago with sciatica, unspecified side: Secondary | ICD-10-CM

## 2016-09-18 DIAGNOSIS — C50912 Malignant neoplasm of unspecified site of left female breast: Secondary | ICD-10-CM

## 2016-09-18 HISTORY — PX: ORIF ANKLE FRACTURE: SHX5408

## 2016-09-18 LAB — GLUCOSE, CAPILLARY: Glucose-Capillary: 100 mg/dL — ABNORMAL HIGH (ref 65–99)

## 2016-09-18 LAB — CREATININE, SERUM
CREATININE: 1.19 mg/dL — AB (ref 0.44–1.00)
GFR calc Af Amer: 56 mL/min — ABNORMAL LOW (ref >60)
GFR, EST NON AFRICAN AMERICAN: 48 mL/min — AB (ref >60)

## 2016-09-18 LAB — SURGICAL PCR SCREEN
MRSA, PCR: NEGATIVE
Staphylococcus aureus: NEGATIVE

## 2016-09-18 LAB — URINE CULTURE: Culture: >=100000 — AB

## 2016-09-18 SURGERY — OPEN REDUCTION INTERNAL FIXATION (ORIF) ANKLE FRACTURE
Anesthesia: General | Laterality: Left

## 2016-09-18 MED ORDER — FENTANYL 100 MCG/HR TD PT72: 100.0000 ug | MEDICATED_PATCH | TRANSDERMAL | 0 refills | Status: DC

## 2016-09-18 MED ORDER — PREGABALIN 75 MG PO CAPS: 200.0000 mg | ORAL_CAPSULE | Freq: Three times a day (TID) | ORAL | Status: DC

## 2016-09-18 MED ORDER — FENTANYL CITRATE (PF) 100 MCG/2ML IJ SOLN
INTRAMUSCULAR | Status: AC
  Filled 2016-09-18: qty 2

## 2016-09-18 MED ORDER — OXYCODONE HCL 20 MG PO TABS: 20.0000 mg | ORAL_TABLET | ORAL | 0 refills | Status: AC | PRN

## 2016-09-18 MED ORDER — LIDOCAINE 2% (20 MG/ML) 5 ML SYRINGE
INTRAMUSCULAR | Status: AC
  Filled 2016-09-18: qty 5

## 2016-09-18 MED ORDER — HYDROMORPHONE HCL 1 MG/ML IJ SOLN: 0.2500 mg | INTRAMUSCULAR | Status: DC | PRN

## 2016-09-18 MED ORDER — PROPOFOL 10 MG/ML IV BOLUS
INTRAVENOUS | Status: AC
  Filled 2016-09-18: qty 20

## 2016-09-18 MED ORDER — LACTATED RINGERS IV SOLN
INTRAVENOUS | Status: DC | PRN
  Administered 2016-09-18: 12:00:00 via INTRAVENOUS

## 2016-09-18 MED ORDER — FENTANYL CITRATE (PF) 100 MCG/2ML IJ SOLN
INTRAMUSCULAR | Status: DC | PRN
  Administered 2016-09-18 (×2): 50 ug via INTRAVENOUS

## 2016-09-18 MED ORDER — OXYCODONE HCL 20 MG PO TABS: 20.0000 mg | ORAL_TABLET | ORAL | Status: DC | PRN

## 2016-09-18 MED ORDER — PREGABALIN 200 MG PO CAPS: 200.0000 mg | ORAL_CAPSULE | Freq: Three times a day (TID) | ORAL | 0 refills | Status: AC

## 2016-09-18 MED ORDER — CEFAZOLIN SODIUM-DEXTROSE 2-4 GM/100ML-% IV SOLN
INTRAVENOUS | Status: AC
  Filled 2016-09-18: qty 100

## 2016-09-18 MED ORDER — METOCLOPRAMIDE HCL 5 MG/ML IJ SOLN: 10.0000 mg | Freq: Once | INTRAMUSCULAR | Status: DC | PRN

## 2016-09-18 MED ORDER — MEPERIDINE HCL 50 MG/ML IJ SOLN: 6.2500 mg | INTRAMUSCULAR | Status: DC | PRN

## 2016-09-18 MED ORDER — LACTATED RINGERS IV SOLN
INTRAVENOUS | Status: DC | PRN
  Administered 2016-09-18 (×2): via INTRAVENOUS

## 2016-09-18 MED ORDER — LIDOCAINE 2% (20 MG/ML) 5 ML SYRINGE
INTRAMUSCULAR | Status: DC | PRN
  Administered 2016-09-18: 100 mg via INTRAVENOUS

## 2016-09-18 MED ORDER — ROCURONIUM BROMIDE 50 MG/5ML IV SOSY
PREFILLED_SYRINGE | INTRAVENOUS | Status: AC
  Filled 2016-09-18: qty 5

## 2016-09-18 MED ORDER — ROCURONIUM BROMIDE 50 MG/5ML IV SOSY
PREFILLED_SYRINGE | INTRAVENOUS | Status: AC
Start: 2016-09-18 — End: 2016-09-18
  Filled 2016-09-18: qty 5

## 2016-09-18 MED ORDER — PHENYLEPHRINE 40 MCG/ML (10ML) SYRINGE FOR IV PUSH (FOR BLOOD PRESSURE SUPPORT)
PREFILLED_SYRINGE | INTRAVENOUS | Status: AC
  Filled 2016-09-18: qty 10

## 2016-09-18 MED ORDER — POVIDONE-IODINE 10 % EX SWAB: 2.0000 "application " | Freq: Once | CUTANEOUS | Status: DC

## 2016-09-18 MED ORDER — CEFAZOLIN SODIUM-DEXTROSE 1-4 GM/50ML-% IV SOLN
1.0000 g | Freq: Once | INTRAVENOUS | Status: AC
  Administered 2016-09-18: 3 g via INTRAVENOUS
  Filled 2016-09-18: qty 50

## 2016-09-18 MED ORDER — PROPOFOL 10 MG/ML IV BOLUS
INTRAVENOUS | Status: DC | PRN
  Administered 2016-09-18: 170 mg via INTRAVENOUS

## 2016-09-18 MED ORDER — PHENYLEPHRINE 40 MCG/ML (10ML) SYRINGE FOR IV PUSH (FOR BLOOD PRESSURE SUPPORT)
PREFILLED_SYRINGE | INTRAVENOUS | Status: DC | PRN
  Administered 2016-09-18 (×5): 80 ug via INTRAVENOUS

## 2016-09-18 MED ORDER — SUCCINYLCHOLINE CHLORIDE 200 MG/10ML IV SOSY
PREFILLED_SYRINGE | INTRAVENOUS | Status: AC
  Filled 2016-09-18: qty 10

## 2016-09-18 MED ORDER — FENTANYL 100 MCG/HR TD PT72: 100.0000 ug | MEDICATED_PATCH | TRANSDERMAL | Status: DC

## 2016-09-18 MED ORDER — SUCCINYLCHOLINE CHLORIDE 200 MG/10ML IV SOSY
PREFILLED_SYRINGE | INTRAVENOUS | Status: DC | PRN
  Administered 2016-09-18: 12 mg via INTRAVENOUS

## 2016-09-18 MED ORDER — FLUDROCORTISONE ACETATE 0.1 MG PO TABS: 100.0000 ug | ORAL_TABLET | Freq: Two times a day (BID) | ORAL | Status: DC

## 2016-09-18 MED ORDER — 0.9 % SODIUM CHLORIDE (POUR BTL) OPTIME
TOPICAL | Status: DC | PRN
  Administered 2016-09-18: 1000 mL

## 2016-09-18 SURGICAL SUPPLY — 34 items
BAG ZIPLOCK 12X15 (MISCELLANEOUS) IMPLANT
BANDAGE ACE 6X5 VEL STRL LF (GAUZE/BANDAGES/DRESSINGS) ×6 IMPLANT
BANDAGE ESMARK 6X9 LF (GAUZE/BANDAGES/DRESSINGS) ×1 IMPLANT
BIT DRILL 2.9 CANN QC NONSTRL (BIT) ×3 IMPLANT
BNDG ESMARK 6X9 LF (GAUZE/BANDAGES/DRESSINGS) ×3
COVER SURGICAL LIGHT HANDLE (MISCELLANEOUS) ×3 IMPLANT
CUFF TOURN SGL QUICK 34 (TOURNIQUET CUFF)
CUFF TRNQT CYL 34X4X40X1 (TOURNIQUET CUFF) IMPLANT
DECANTER SPIKE VIAL GLASS SM (MISCELLANEOUS) IMPLANT
DRAPE C-ARM 42X120 X-RAY (DRAPES) ×3 IMPLANT
DRAPE C-ARMOR (DRAPES) ×3 IMPLANT
DRAPE EXTREMITY T 121X128X90 (DRAPE) ×3 IMPLANT
DRAPE U-SHAPE 47X51 STRL (DRAPES) ×3 IMPLANT
DRSG ADAPTIC 3X8 NADH LF (GAUZE/BANDAGES/DRESSINGS) ×3 IMPLANT
DRSG PAD ABDOMINAL 8X10 ST (GAUZE/BANDAGES/DRESSINGS) IMPLANT
ELECT REM PT RETURN 15FT ADLT (MISCELLANEOUS) ×3 IMPLANT
GAUZE SPONGE 4X4 12PLY STRL (GAUZE/BANDAGES/DRESSINGS) IMPLANT
GLOVE BIO SURGEON STRL SZ8 (GLOVE) ×3 IMPLANT
GLOVE ECLIPSE 7.5 STRL STRAW (GLOVE) ×3 IMPLANT
K-WIRE ACE 1.6X6 (WIRE) ×6
KWIRE ACE 1.6X6 (WIRE) ×2 IMPLANT
MANIFOLD NEPTUNE II (INSTRUMENTS) ×3 IMPLANT
NEEDLE HYPO 22GX1.5 SAFETY (NEEDLE) ×3 IMPLANT
PACK TOTAL JOINT (CUSTOM PROCEDURE TRAY) ×3 IMPLANT
PAD CAST 4YDX4 CTTN HI CHSV (CAST SUPPLIES) ×2 IMPLANT
PADDING CAST COTTON 4X4 STRL (CAST SUPPLIES) ×4
POSITIONER SURGICAL ARM (MISCELLANEOUS) IMPLANT
SCREW ACE CAN 4.0 50M (Screw) ×3 IMPLANT
SCREW ACE CAN 4.0 60M (Screw) ×3 IMPLANT
SPLINT FIBERGLASS 5X30 (CAST SUPPLIES) ×6 IMPLANT
SUT ETHILON 4 0 PS 2 18 (SUTURE) ×6 IMPLANT
SUT VIC AB 2-0 CT1 27 (SUTURE) ×2
SUT VIC AB 2-0 CT1 TAPERPNT 27 (SUTURE) ×1 IMPLANT
SYR CONTROL 10ML LL (SYRINGE) ×3 IMPLANT

## 2016-09-18 NOTE — Progress Notes (Signed)
PROGRESS NOTE    Kerri Vasquez  HEN:[id number] DOB: 1955-06-13 DOA: 09/14/2016 PCP: [name], MD     Brief Narrative:  61 y/o woman admitted from home on 5/17 due to pain of her left leg following a fall 1.5 weeks ago. XRays show left ankle fracture involving the distal tibia and fibula. She was admitted to the ICU due to sepsis 2/2 UTI. Cx with ESBL Ecoli, will DC rocephin and place on meropnem.   Assessment & Plan:   Principal Problem:   Sepsis ([place]) Active Problems:   Sleep apnea   Breast cancer metastasized to liver ([place])   Takotsubo syndrome   Acute lower UTI   Pressure injury of skin   Sepsis -Due to UTI. -Urine Cx with ESBL E Coli and klebsiella; Continue meropenem for at least 5 days. -Blood Cx 1/2 with GNR and CNS and acinetobacter. -Sepsis parameters (hypotension, tachycardia) have resolved.  Left Ankle Fracture -Ortho planning on surgery to repair fracture today. -OK for surgical repair per sepsis standpoint.  H/o Metastatic Breast Cancer -Last received chemo about 2 months ago. -Has lymphedema of the right arm. -Continue OP follow up with oncology as scheduled.  Hypothyroidism -TSH elevated at 5.6. -Synthroid has been increased from 275 to 300 mcg daily. -Will need repeat thyroid functions tests in 4-6 weeks.  Stage III CKD -Baseline Cr is around 1.5-1.7. -Currently at baseline (1.19 today). -No indications for dialysis.  Elevated D Dimer -Believe this can be explained by her sepsis. -No need for r/o PE at this time. -She has not had CP/SOB or any other symptoms that could be attributed to a PE. Tachycardia and hypotension are likely from sepsis and have resolved with fluid administration.  Stage IV Sacral Decubitus Ulcer -Does not appear infected. -Appreciate wound care recommendations.  H/o Takatsubo Cardiomyopathy -Last ECHO in 11/17 was EF 36-14%, grade 1 diastolic dysfunction. -Minimally elevated troponin. Likely  from sepsis. They have remained flat, no acute EKG changes. -No further cardiac work up planned for at this time.   DVT prophylaxis: SQ heaprin Code Status: full code Family Communication: patient only Disposition Plan: await orthopedic repair of ankle fracture.  Consultants:   Orthopedics, Dr. Berenice Primas  Procedures:   None  Antimicrobials:  Anti-infectives    Start     Dose/Rate Route Frequency Ordered Stop   09/18/16 0600  ceFAZolin (ANCEF) 3 g in dextrose 5 % 50 mL IVPB     3 g 130 mL/hr over 30 Minutes Intravenous On call to O.R. 09/17/16 1405 09/19/16 0559   09/17/16 1000  meropenem (MERREM) 1 g in sodium chloride 0.9 % 100 mL IVPB     1 g 200 mL/hr over 30 Minutes Intravenous Every 8 hours 09/17/16 0859     09/15/16 2200  cefTRIAXone (ROCEPHIN) 2 g in dextrose 5 % 50 mL IVPB  Status:  Discontinued     2 g 100 mL/hr over 30 Minutes Intravenous Every 24 hours 09/14/16 2150 09/17/16 0853   09/14/16 2145  cefTRIAXone (ROCEPHIN) 2 g in dextrose 5 % 50 mL IVPB     2 g 100 mL/hr over 30 Minutes Intravenous  Once 09/14/16 2143 09/14/16 2235       Subjective: A little anxious about surgery planned for later today.  Objective: Vitals:   09/17/16 2013 09/18/16 0338 09/18/16 0356 09/18/16 1040  BP: 131/89  114/87 117/70  Pulse: (!) 110  97 85  Resp: 18  18 20   Temp: 98.4 F (36.9 C)  97.6 F (36.4 C) 98.1 F (36.7 C)  TempSrc: Oral  Oral Axillary  SpO2: 98%  96%   Weight:  123.8 kg (272 lb 14.9 oz)    Height:        Intake/Output Summary (Last 24 hours) at 09/18/16 1050 Last data filed at 09/18/16 0658  Gross per 24 hour  Intake           1047.5 ml  Output              600 ml  Net            447.5 ml   Filed Weights   09/16/16 0500 09/17/16 0309 09/18/16 0338  Weight: 132.7 kg (292 lb 8.8 oz) 132 kg (291 lb 0.1 oz) 123.8 kg (272 lb 14.9 oz)    Examination:   General exam: Alert, awake, oriented x 3 Respiratory system: Clear to auscultation. Respiratory  effort normal. Cardiovascular system:RRR. No murmurs, rubs, gallops. Gastrointestinal system: Abdomen is nondistended, soft and nontender. No organomegaly or masses felt. Normal bowel sounds heard. Central nervous system: Alert and oriented. No focal neurological deficits. Extremities: 1+ bilatral pitting edema. Small right heel ulcer. Skin: large stage IV sacral decub ulcer Psychiatry: Judgement and insight appear normal. Mood & affect appropriate.     Data Reviewed: I have personally reviewed following labs and imaging studies  CBC:  Recent Labs Lab 09/14/16 1952 09/15/16 0337 09/16/16 0517 09/17/16 0405  WBC 13.3* 10.1 8.6 9.5  NEUTROABS 12.2*  --   --   --   HGB 14.0 14.2 11.0* 11.1*  HCT 41.5 42.5 33.8* 33.5*  MCV 98.1 98.8 99.4 99.1  PLT 216 184 206 564   Basic Metabolic Panel:  Recent Labs Lab 09/14/16 1952 09/15/16 0337 09/16/16 0517 09/17/16 0405 09/18/16 0449  NA 139 138 141 137  --   K 4.4 4.7 3.5 3.2*  --   CL 109 110 113* 110  --   CO2 17* 14* 17* 15*  --   GLUCOSE 72 73 85 107*  --   BUN 66* 58* 50* 45*  --   CREATININE 1.70* 1.51* 1.31* 1.38* 1.19*  CALCIUM 8.8* 8.4* 8.1* 8.0*  --    GFR: Estimated Creatinine Clearance: 65.9 mL/min (A) (by C-G formula based on SCr of 1.19 mg/dL (H)). Liver Function Tests: No results for input(s): AST, ALT, ALKPHOS, BILITOT, PROT, ALBUMIN in the last 168 hours. No results for input(s): LIPASE, AMYLASE in the last 168 hours. No results for input(s): AMMONIA in the last 168 hours. Coagulation Profile:  Recent Labs Lab 09/15/16 0030  INR 1.22   Cardiac Enzymes:  Recent Labs Lab 09/15/16 0030 09/15/16 1004 09/15/16 1842 09/16/16 0006  TROPONINI 0.04* 0.05* 0.03* 0.03*   BNP (last 3 results) No results for input(s): PROBNP in the last 8760 hours. HbA1C: No results for input(s): HGBA1C in the last 72 hours. CBG: No results for input(s): GLUCAP in the last 168 hours. Lipid Profile: No results for  input(s): CHOL, HDL, LDLCALC, TRIG, CHOLHDL, LDLDIRECT in the last 72 hours. Thyroid Function Tests: No results for input(s): TSH, T4TOTAL, FREET4, T3FREE, THYROIDAB in the last 72 hours. Anemia Panel: No results for input(s): VITAMINB12, FOLATE, FERRITIN, TIBC, IRON, RETICCTPCT in the last 72 hours. Urine analysis:    Component Value Date/Time   COLORURINE AMBER (A) 09/14/2016 2011   APPEARANCEUR CLOUDY (A) 09/14/2016 2011   LABSPEC 1.020 09/14/2016 2011   PHURINE 5.0 09/14/2016 2011   [place] 09/14/2016 2011  HGBUR LARGE (A) 09/14/2016 2011   BILIRUBINUR NEGATIVE 09/14/2016 2011   BILIRUBINUR negative 05/24/2011 1630   KETONESUR 20 (A) 09/14/2016 2011   PROTEINUR 100 (A) 09/14/2016 2011   UROBILINOGEN 0.2 08/26/2014 1850   NITRITE POSITIVE (A) 09/14/2016 2011   LEUKOCYTESUR LARGE (A) 09/14/2016 2011   Sepsis Labs: @LABRCNTIP (procalcitonin:4,lacticidven:4)  ) Recent Results (from the past 240 hour(s))  Culture, blood (routine x 2)     Status: Abnormal   Collection Time: 09/14/16  8:11 PM  Result Value Ref Range Status   Specimen Description BLOOD LEFT ANTECUBITAL  Final   Special Requests   Final    BOTTLES DRAWN AEROBIC AND ANAEROBIC Blood Culture adequate volume   Culture  Setup Time   Final    GRAM NEGATIVE RODS AEROBIC BOTTLE ONLY D. ZEIGLER, [place] ([place]) AT 3810 ON 09/15/16 BY C. JESSUP, MLT. GRAM POSITIVE COCCI IN CLUSTERS ANAEROBIC BOTTLE ONLY CRITICAL RESULT CALLED TO, READ BACK BY AND VERIFIED WITH: N GLOGOVAC,PHARMD AT 1951 09/16/16 BY L BENFIELD    Culture (A)  Final    ACINETOBACTER SPECIES STAPHYLOCOCCUS SPECIES (COAGULASE NEGATIVE) THE SIGNIFICANCE OF ISOLATING THIS ORGANISM FROM A SINGLE SET OF BLOOD CULTURES WHEN MULTIPLE SETS ARE DRAWN IS UNCERTAIN. PLEASE NOTIFY THE MICROBIOLOGY DEPARTMENT WITHIN ONE WEEK IF SPECIATION AND SENSITIVITIES ARE REQUIRED. Performed at [facility], [place] [address], [place], [place] [address]    Report Status  09/17/2016 FINAL  Final   Organism ID, Bacteria ACINETOBACTER SPECIES  Final      Susceptibility   Acinetobacter species - MIC*    CEFTAZIDIME <=1 SENSITIVE Sensitive     CEFTRIAXONE 8 SENSITIVE Sensitive     CIPROFLOXACIN <=0.25 SENSITIVE Sensitive     GENTAMICIN <=1 SENSITIVE Sensitive     IMIPENEM <=0.25 SENSITIVE Sensitive     PIP/TAZO >=128 RESISTANT Resistant     TRIMETH/SULFA <=20 SENSITIVE Sensitive     CEFEPIME <=1 SENSITIVE Sensitive     AMPICILLIN/SULBACTAM <=2 SENSITIVE Sensitive     * ACINETOBACTER SPECIES  Urine culture     Status: Abnormal   Collection Time: 09/14/16  8:11 PM  Result Value Ref Range Status   Specimen Description URINE, RANDOM  Final   Special Requests NONE  Final   Culture (A)  Final    >=100,000 COLONIES/mL ESCHERICHIA COLI Confirmed Extended Spectrum Beta-Lactamase Producer (ESBL) >=100,000 COLONIES/mL KLEBSIELLA OXYTOCA    Report Status 09/18/2016 FINAL  Final   Organism ID, Bacteria ESCHERICHIA COLI (A)  Final   Organism ID, Bacteria KLEBSIELLA OXYTOCA (A)  Final      Susceptibility   Escherichia coli - MIC*    AMPICILLIN >=32 RESISTANT Resistant     CEFAZOLIN >=64 RESISTANT Resistant     CEFTRIAXONE >=64 RESISTANT Resistant     CIPROFLOXACIN >=4 RESISTANT Resistant     GENTAMICIN >=16 RESISTANT Resistant     IMIPENEM <=0.25 SENSITIVE Sensitive     NITROFURANTOIN <=16 SENSITIVE Sensitive     TRIMETH/SULFA >=320 RESISTANT Resistant     AMPICILLIN/SULBACTAM >=32 RESISTANT Resistant     PIP/TAZO 8 SENSITIVE Sensitive     Extended ESBL POSITIVE Resistant     * >=100,000 COLONIES/mL ESCHERICHIA COLI   Klebsiella oxytoca - MIC*    AMPICILLIN >=32 RESISTANT Resistant     CEFAZOLIN >=64 RESISTANT Resistant     CEFTRIAXONE <=1 SENSITIVE Sensitive     CIPROFLOXACIN <=0.25 SENSITIVE Sensitive     GENTAMICIN <=1 SENSITIVE Sensitive     IMIPENEM <=0.25 SENSITIVE Sensitive  NITROFURANTOIN <=16 SENSITIVE Sensitive     TRIMETH/SULFA <=20  SENSITIVE Sensitive     AMPICILLIN/SULBACTAM 16 INTERMEDIATE Intermediate     PIP/TAZO <=4 SENSITIVE Sensitive     Extended ESBL NEGATIVE Sensitive     * >=100,000 COLONIES/mL KLEBSIELLA OXYTOCA  Blood Culture ID Panel (Reflexed)     Status: None   Collection Time: 09/14/16  8:11 PM  Result Value Ref Range Status   Enterococcus species NOT DETECTED NOT DETECTED Final   Listeria monocytogenes NOT DETECTED NOT DETECTED Final   Staphylococcus species NOT DETECTED NOT DETECTED Final   Staphylococcus aureus NOT DETECTED NOT DETECTED Final   Streptococcus species NOT DETECTED NOT DETECTED Final   Streptococcus agalactiae NOT DETECTED NOT DETECTED Final   Streptococcus pneumoniae NOT DETECTED NOT DETECTED Final   Streptococcus pyogenes NOT DETECTED NOT DETECTED Final   Acinetobacter baumannii NOT DETECTED NOT DETECTED Final   Enterobacteriaceae species NOT DETECTED NOT DETECTED Final   Enterobacter cloacae complex NOT DETECTED NOT DETECTED Final   Escherichia coli NOT DETECTED NOT DETECTED Final   Klebsiella oxytoca NOT DETECTED NOT DETECTED Final   Klebsiella pneumoniae NOT DETECTED NOT DETECTED Final   Proteus species NOT DETECTED NOT DETECTED Final   Serratia marcescens NOT DETECTED NOT DETECTED Final   Haemophilus influenzae NOT DETECTED NOT DETECTED Final   Neisseria meningitidis NOT DETECTED NOT DETECTED Final   Pseudomonas aeruginosa NOT DETECTED NOT DETECTED Final   Candida albicans NOT DETECTED NOT DETECTED Final   Candida glabrata NOT DETECTED NOT DETECTED Final   Candida krusei NOT DETECTED NOT DETECTED Final   Candida parapsilosis NOT DETECTED NOT DETECTED Final   Candida tropicalis NOT DETECTED NOT DETECTED Final    Comment: Performed at [facility], [address] [place], [place] [address]  Blood Culture ID Panel (Reflexed)     Status: Abnormal   Collection Time: 09/14/16  8:11 PM  Result Value Ref Range Status   Enterococcus species NOT DETECTED NOT DETECTED Final    Listeria monocytogenes NOT DETECTED NOT DETECTED Final   Staphylococcus species DETECTED (A) NOT DETECTED Final    Comment: Methicillin (oxacillin) susceptible coagulase negative staphylococcus. Possible blood culture contaminant (unless isolated from more than one blood culture draw or clinical case suggests pathogenicity). No antibiotic treatment is indicated for blood  culture contaminants. CRITICAL RESULT CALLED TO, READ BACK BY AND VERIFIED WITH: N GLOGOVAC,PHARMD AT 1951 09/16/16 BY L BENFIELD    Staphylococcus aureus NOT DETECTED NOT DETECTED Final   Methicillin resistance NOT DETECTED NOT DETECTED Final   Streptococcus species NOT DETECTED NOT DETECTED Final   Streptococcus agalactiae NOT DETECTED NOT DETECTED Final   Streptococcus pneumoniae NOT DETECTED NOT DETECTED Final   Streptococcus pyogenes NOT DETECTED NOT DETECTED Final   Acinetobacter baumannii NOT DETECTED NOT DETECTED Final   Enterobacteriaceae species NOT DETECTED NOT DETECTED Final   Enterobacter cloacae complex NOT DETECTED NOT DETECTED Final   Escherichia coli NOT DETECTED NOT DETECTED Final   Klebsiella oxytoca NOT DETECTED NOT DETECTED Final   Klebsiella pneumoniae NOT DETECTED NOT DETECTED Final   Proteus species NOT DETECTED NOT DETECTED Final   Serratia marcescens NOT DETECTED NOT DETECTED Final   Haemophilus influenzae NOT DETECTED NOT DETECTED Final   Neisseria meningitidis NOT DETECTED NOT DETECTED Final   Pseudomonas aeruginosa NOT DETECTED NOT DETECTED Final   Candida albicans NOT DETECTED NOT DETECTED Final   Candida glabrata NOT DETECTED NOT DETECTED Final   Candida krusei NOT DETECTED NOT DETECTED Final  Candida parapsilosis NOT DETECTED NOT DETECTED Final   Candida tropicalis NOT DETECTED NOT DETECTED Final    Comment: Performed at [facility], [place] [address], [place], [place] [address]  Culture, blood (routine x 2)     Status: None (Preliminary result)   Collection Time: 09/15/16 12:30  AM  Result Value Ref Range Status   Specimen Description BLOOD RIGHT ARM  Final   Special Requests   Final    BOTTLES DRAWN AEROBIC AND ANAEROBIC Blood Culture adequate volume   Culture   Final    NO GROWTH 3 DAYS Performed at [facility], [address], [place], [place] [address]    Report Status PENDING  Incomplete  MRSA PCR Screening     Status: None   Collection Time: 09/15/16  2:55 AM  Result Value Ref Range Status   MRSA by PCR NEGATIVE NEGATIVE Final    Comment:        The GeneXpert MRSA Assay (FDA approved for NASAL specimens only), is one component of a comprehensive MRSA colonization surveillance program. It is not intended to diagnose MRSA infection nor to guide or monitor treatment for MRSA infections.   Surgical PCR screen     Status: None   Collection Time: 09/18/16 12:03 AM  Result Value Ref Range Status   MRSA, PCR NEGATIVE NEGATIVE Final   Staphylococcus aureus NEGATIVE NEGATIVE Final    Comment:        The Xpert SA Assay (FDA approved for NASAL specimens in patients over 84 years of age), is one component of a comprehensive surveillance program.  Test performance has been validated by [facility] for patients greater than or equal to 37 year old. It is not intended to diagnose infection nor to guide or monitor treatment.          Radiology Studies: No results found.      Scheduled Meds: . Chlorhexidine Gluconate Cloth  6 each Topical Daily  . feeding supplement (ENSURE ENLIVE)  237 mL Oral BID BM  . feeding supplement (PRO-STAT SUGAR FREE 64)  30 mL Oral BID WC  . fentaNYL  100 mcg Transdermal Q48H  . FLUoxetine  40 mg Oral QAC breakfast  . levothyroxine  300 mcg Oral QAC breakfast  . lipase/protease/amylase  24,000 Units Oral TID AC  . multivitamin with minerals  1 tablet Oral Daily  . pantoprazole  40 mg Oral BID  . povidone-iodine  2 application Topical Once  . pregabalin  200 mg Oral TID  . sodium chloride flush  10-40  mL Intracatheter Q12H   Continuous Infusions: .  ceFAZolin (ANCEF) IV    . dextrose 5 % and 0.45% NaCl 75 mL/hr at 09/18/16 0124  . meropenem (MERREM) IV Stopped (09/18/16 0410)     LOS: 4 days    Time spent: 25 minutes. Greater than 50% of this time was spent in direct contact with the patient coordinating care.     Lelon Frohlich, MD [facility] Pager [phone]  If 7PM-7AM, please contact night-coverage [email or web address] Password [email or web address] 09/18/2016, 10:50 AM

## 2016-09-18 NOTE — Anesthesia Procedure Notes (Signed)
Procedure Name: Intubation Date/Time: 09/18/2016 12:00 PM Performed by: Lind Covert Pre-anesthesia Checklist: Patient identified, Emergency Drugs available, Suction available, Patient being monitored and Timeout performed Patient Re-evaluated:Patient Re-evaluated prior to inductionOxygen Delivery Method: Circle system utilized Preoxygenation: Pre-oxygenation with 100% oxygen Intubation Type: IV induction Laryngoscope Size: Mac and 4 Grade View: Grade I Tube type: Oral Tube size: 7.0 mm Number of attempts: 1 Airway Equipment and Method: Stylet Placement Confirmation: ETT inserted through vocal cords under direct vision,  positive ETCO2 and breath sounds checked- equal and bilateral Secured at: 22 cm Tube secured with: Tape (paper) Dental Injury: Teeth and Oropharynx as per pre-operative assessment

## 2016-09-18 NOTE — Care Management Important Message (Signed)
Important Message  Patient Details IM Letter given to Rhonda/Case Manager to present to Patient Name: Kerri Vasquez MRN: [id number] Date of Birth: Mar 09, 1956   Medicare Important Message Given:  Yes    Kerin Salen 09/18/2016, 10:37 AM

## 2016-09-18 NOTE — Brief Op Note (Signed)
09/14/2016 - 09/18/2016  1:16 PM  PATIENT:  Kerri Vasquez  61 y.o. female  PRE-OPERATIVE DIAGNOSIS:  * No pre-op diagnosis entered *  POST-OPERATIVE DIAGNOSIS:  fractured left ankle  PROCEDURE:  Procedure(s): OPEN REDUCTION INTERNAL FIXATION (ORIF) LEFT ANKLE FRACTURE (Left)  SURGEON:  Surgeon(s) and Role:    Dorna Leitz, MD - Primary  PHYSICIAN ASSISTANT:   ASSISTANTS: none   ANESTHESIA:   general  EBL:  Total I/O In: 1400 [I.V.:1400] Out: -   BLOOD ADMINISTERED:none  DRAINS: none   LOCAL MEDICATIONS USED:  NONE  SPECIMEN:  No Specimen  DISPOSITION OF SPECIMEN:  N/A  COUNTS:  YES  TOURNIQUET:   Total Tourniquet Time Documented: Calf (Left) - 42 minutes Total: Calf (Left) - 42 minutes   DICTATION: .Other Dictation: Dictation Number [id number]  PLAN OF CARE: Admit to inpatient   PATIENT DISPOSITION:  PACU - hemodynamically stable.   Delay start of Pharmacological VTE agent (>24hrs) due to surgical blood loss or risk of bleeding: no

## 2016-09-18 NOTE — Anesthesia Postprocedure Evaluation (Signed)
Anesthesia Post Note  Patient: Kerri Vasquez  Procedure(s) Performed: Procedure(s) (LRB): OPEN REDUCTION INTERNAL FIXATION (ORIF) LEFT ANKLE FRACTURE (Left)  Patient location during evaluation: PACU Anesthesia Type: General Level of consciousness: awake and alert and lethargic Pain management: pain level controlled Vital Signs Assessment: post-procedure vital signs reviewed and stable Respiratory status: spontaneous breathing, nonlabored ventilation, respiratory function stable and patient connected to nasal cannula oxygen Cardiovascular status: blood pressure returned to baseline and stable Postop Assessment: no signs of nausea or vomiting Anesthetic complications: no       Last Vitals:  Vitals:   09/18/16 1400 09/18/16 1415  BP: 127/85 127/80  Pulse: 86 86  Resp: 10 12  Temp:      Last Pain:  Vitals:   09/18/16 1345  TempSrc:   PainSc: Asleep                 Cyerra Yim A.

## 2016-09-18 NOTE — Transfer of Care (Signed)
Immediate Anesthesia Transfer of Care Note  Patient: Kerri Vasquez  Procedure(s) Performed: Procedure(s): OPEN REDUCTION INTERNAL FIXATION (ORIF) LEFT ANKLE FRACTURE (Left)  Patient Location: PACU  Anesthesia Type:General  Level of Consciousness: sedated  Airway & Oxygen Therapy: Patient Spontanous Breathing and Patient connected to face mask oxygen  Post-op Assessment: Report given to RN and Post -op Vital signs reviewed and stable  Post vital signs: Reviewed and stable  Last Vitals:  Vitals:   09/18/16 1040 09/18/16 1325  BP: 117/70 (!) (P) 138/92  Pulse: 85 97  Resp: 20 10  Temp: 36.7 C (P) 36.6 C    Last Pain:  Vitals:   09/18/16 1040  TempSrc: Axillary  PainSc:       Patients Stated Pain Goal: 3 (15/18/34 3735)  Complications: No apparent anesthesia complications

## 2016-09-18 NOTE — Progress Notes (Signed)
Subjective: Persistent l ankle pain. Difficulty walking with some r leg pain,   Objective: Vital signs in last 24 hours: Temp:  [97.6 F (36.4 C)-98.4 F (36.9 C)] 98.1 F (36.7 C) (05/21 1040) Pulse Rate:  [85-110] 85 (05/21 1040) Resp:  [16-20] 20 (05/21 1040) BP: (113-131)/(70-89) 117/70 (05/21 1040) SpO2:  [96 %-98 %] 96 % (05/21 0356) Weight:  [123.8 kg (272 lb 14.9 oz)] 123.8 kg (272 lb 14.9 oz) (05/21 0338)  Intake/Output from previous day: 05/20 0701 - 05/21 0700 In: 1297.5 [P.O.:780; I.V.:217.5; IV Piggyback:300] Out: 600 [Urine:600] Intake/Output this shift: No intake/output data recorded.   Recent Labs  09/16/16 0517 09/17/16 0405  HGB 11.0* 11.1*    Recent Labs  09/16/16 0517 09/17/16 0405  WBC 8.6 9.5  RBC 3.40* 3.38*  HCT 33.8* 33.5*  PLT 206 199    Recent Labs  09/16/16 0517 09/17/16 0405 09/18/16 0449  NA 141 137  --   K 3.5 3.2*  --   CL 113* 110  --   CO2 17* 15*  --   BUN 50* 45*  --   CREATININE 1.31* 1.38* 1.19*  GLUCOSE 85 107*  --   CALCIUM 8.1* 8.0*  --    No results for input(s): LABPT, INR in the last 72 hours.  Neurologically intact ABD soft Sensation intact distally Intact pulses distally Compartment soft  Assessment/Plan: Left ankle intra-articular fracture with mild displacement.  //Neds ORIF as outlined.   Kerri Vasquez L 09/18/2016, 11:46 AM

## 2016-09-18 NOTE — Anesthesia Preprocedure Evaluation (Signed)
Anesthesia Evaluation  Patient identified by MRN, date of birth, ID band Patient awake    Reviewed: Allergy & Precautions, H&P , NPO status , Patient's Chart, lab work & pertinent test results, reviewed documented beta blocker date and time   Airway Mallampati: II  TM Distance: >3 FB Neck ROM: full    Dental  (+) Chipped, Missing, Poor Dentition,    Pulmonary shortness of breath and with exertion, sleep apnea and Continuous Positive Airway Pressure Ventilation , pneumonia,    Pulmonary exam normal breath sounds clear to auscultation       Cardiovascular hypertension, Pt. on medications and Pt. on home beta blockers + Past MI and +CHF  Normal cardiovascular exam Rhythm:Regular Rate:Normal  Echo 03/15/2016 - Left ventricle: The cavity size was normal. There was mild concentric hypertrophy. Systolic function was vigorous. The estimated ejection fraction was in the range of 65% to 70%. Wall motion was normal; there were no regional wall motion abnormalities. Doppler parameters are consistent with abnormal left ventricular relaxation (grade 1 diastolic dysfunction). Doppler parameters are consistent with indeterminate ventricular filling pressure. - Aortic valve: Transvalvular velocity was within the normal range. There was no stenosis. There was no regurgitation. - Mitral valve: Transvalvular velocity was within the normal range. There was no evidence for stenosis. There was no regurgitation. - Right ventricle: The cavity size was normal. Wall thickness was normal. Systolic function was normal. - Tricuspid valve: There was no regurgitation.   Neuro/Psych  Headaches, PSYCHIATRIC DISORDERS Depression  Neuromuscular disease    GI/Hepatic Neg liver ROS, GERD  Medicated and Controlled,  Endo/Other  diabetes, Well Controlled, Type 2, Oral Hypoglycemic AgentsHypothyroidism Morbid obesityRight breast Ca- undergoing chemoRx  Renal/GU Renal  InsufficiencyRenal disease     Musculoskeletal  (+) Arthritis , Rheumatoid disorders,  Fibromyalgia -, narcotic dependentTrimalleolar Fx right ankle   Abdominal (+) + obese,   Peds  Hematology  (+) Blood dyscrasia, anemia , Pernicious anemia 2013   Anesthesia Other Findings Breast Ca  Reproductive/Obstetrics negative OB ROS                             Anesthesia Physical  Anesthesia Plan  ASA: III  Anesthesia Plan: General   Post-op Pain Management:    Induction: Intravenous  Airway Management Planned: Oral ETT  Additional Equipment: None  Intra-op Plan:   Post-operative Plan: Extubation in OR  Informed Consent: I have reviewed the patients History and Physical, chart, labs and discussed the procedure including the risks, benefits and alternatives for the proposed anesthesia with the patient or authorized representative who has indicated his/her understanding and acceptance.   Dental advisory given  Plan Discussed with: CRNA, Anesthesiologist and Surgeon  Anesthesia Plan Comments:         Anesthesia Quick Evaluation

## 2016-09-19 ENCOUNTER — Inpatient Hospital Stay (HOSPITAL_COMMUNITY): Payer: Medicare Other

## 2016-09-19 ENCOUNTER — Encounter (HOSPITAL_COMMUNITY): Payer: Self-pay | Admitting: Orthopedic Surgery

## 2016-09-19 DIAGNOSIS — R7881 Bacteremia: Secondary | ICD-10-CM

## 2016-09-19 DIAGNOSIS — M79662 Pain in left lower leg: Secondary | ICD-10-CM

## 2016-09-19 DIAGNOSIS — A4151 Sepsis due to Escherichia coli [E. coli]: Principal | ICD-10-CM

## 2016-09-19 DIAGNOSIS — A408 Other streptococcal sepsis: Secondary | ICD-10-CM

## 2016-09-19 DIAGNOSIS — C50919 Malignant neoplasm of unspecified site of unspecified female breast: Secondary | ICD-10-CM

## 2016-09-19 LAB — HEPATIC FUNCTION PANEL
ALT: 29 U/L (ref 14–54)
AST: 93 U/L — ABNORMAL HIGH (ref 15–41)
Albumin: 2.1 g/dL — ABNORMAL LOW (ref 3.5–5.0)
Alkaline Phosphatase: 403 U/L — ABNORMAL HIGH (ref 38–126)
BILIRUBIN DIRECT: 0.3 mg/dL (ref 0.1–0.5)
BILIRUBIN INDIRECT: 0.8 mg/dL (ref 0.3–0.9)
Total Bilirubin: 1.1 mg/dL (ref 0.3–1.2)
Total Protein: 5.5 g/dL — ABNORMAL LOW (ref 6.5–8.1)

## 2016-09-19 LAB — ECHOCARDIOGRAM LIMITED
Height: 66 in
Weight: 4779.57 oz

## 2016-09-19 LAB — AMMONIA: AMMONIA: 105 umol/L — AB (ref 9–35)

## 2016-09-19 LAB — PROCALCITONIN: PROCALCITONIN: 0.8 ng/mL

## 2016-09-19 MED ORDER — IOPAMIDOL (ISOVUE-300) INJECTION 61%
100.0000 mL | Freq: Once | INTRAVENOUS | Status: AC | PRN
  Administered 2016-09-19: 100 mL via INTRAVENOUS

## 2016-09-19 MED ORDER — IOPAMIDOL (ISOVUE-300) INJECTION 61%
15.0000 mL | Freq: Two times a day (BID) | INTRAVENOUS | Status: DC | PRN
  Administered 2016-09-19: 15 mL via ORAL
  Filled 2016-09-19: qty 30

## 2016-09-19 MED ORDER — FLUDROCORTISONE ACETATE 0.1 MG PO TABS
100.0000 ug | ORAL_TABLET | Freq: Two times a day (BID) | ORAL | Status: DC
  Administered 2016-09-19 – 2016-09-22 (×6): 100 ug via ORAL
  Filled 2016-09-19 (×6): qty 1

## 2016-09-19 MED ORDER — LACTULOSE 10 GM/15ML PO SOLN: 30.0000 g | Freq: Two times a day (BID) | ORAL | Status: DC

## 2016-09-19 MED ORDER — IOPAMIDOL (ISOVUE-300) INJECTION 61%
INTRAVENOUS | Status: AC
  Administered 2016-09-19: 30 mL
  Filled 2016-09-19: qty 30

## 2016-09-19 MED ORDER — IOPAMIDOL (ISOVUE-300) INJECTION 61%
INTRAVENOUS | Status: AC
  Filled 2016-09-19: qty 100

## 2016-09-19 MED ORDER — LACTULOSE 10 GM/15ML PO SOLN
30.0000 g | Freq: Once | ORAL | Status: AC
  Administered 2016-09-19: 30 g via ORAL
  Filled 2016-09-19: qty 60

## 2016-09-19 MED FILL — LYRICA 200 MG CAPSULE: 200 | 30 days supply | Qty: 90 | Fill #0

## 2016-09-19 NOTE — Evaluation (Signed)
Physical Therapy Evaluation Patient Details Name: Kerri Vasquez MRN: [id number] DOB: 05/18/1955 Today's Date: 09/19/2016   History of Present Illness  Kerri Vasquez is a 61 y.o. female with history of stage IV breast cancer, history of cardiomyopathy  hypothyroidism, sacral decubitus ulcer, arthritis presents to the ER 09/14/16 with a fall at home, with increased ankle pain. Has not been able to ambulate since.  Clinical Impression  The  patient is very lethargic, requires 2 total assist to  Sit at the bed edge, and then back to supine, Slide sheets utilized to slide up in the bed. The  Patient was recently DC'd from SNF. Pt admitted with above diagnosis. Pt currently with functional limitations due to the deficits listed below (see PT Problem List). Pt will benefit from skilled PT to increase their independence and safety with mobility to allow discharge to the venue listed below.       Follow Up Recommendations SNF;Supervision/Assistance - 24 hour    Equipment Recommendations  None recommended by PT    Recommendations for Other Services       Precautions / Restrictions Precautions Precautions: Fall Precaution Comments: sacral decubitus Restrictions Weight Bearing Restrictions: Yes LLE Weight Bearing: Non weight bearing      Mobility  Bed Mobility Overal bed mobility: Needs Assistance Bed Mobility: Rolling;Supine to Sit;Sit to Supine Rolling: +2 for safety/equipment;+2 for physical assistance   Supine to sit: +2 for safety/equipment;+2 for physical assistance Sit to supine: +2 for physical assistance;+2 for safety/equipment   General bed mobility comments: patient requires + 3 for moving up in the bed. Much assistance to roll and to move to sitting and back to supine, the patient  does not really assist  Transfers                    Ambulation/Gait                Stairs            Wheelchair Mobility    Modified Rankin  (Stroke Patients Only)       Balance Overall balance assessment: Needs assistance;History of Falls Sitting-balance support: Feet unsupported;Bilateral upper extremity supported Sitting balance-Leahy Scale: Zero   Postural control: Posterior lean                                   Pertinent Vitals/Pain Pain Assessment: Faces Faces Pain Scale: Hurts even more Pain Location: left ankle Pain Descriptors / Indicators: Discomfort;Grimacing;Guarding;Moaning Pain Intervention(s): Monitored during session;Premedicated before session;Repositioned    Home Living Family/patient expects to be discharged to:: Private residence Living Arrangements: Children Available Help at Discharge: Family Type of Home: House Home Access: Ramped entrance     Home Layout: One level Home Equipment: Bedside commode;Walker - 2 wheels;Cane - single point;Wheelchair - manual Additional Comments: was DC'd from SNF 2 weeks ago    Prior Function Level of Independence: Needs assistance               Hand Dominance        Extremity/Trunk Assessment   Upper Extremity Assessment Upper Extremity Assessment: RUE deficits/detail;LUE deficits/detail RUE Deficits / Details: patient barely controls arm while in supine, tends to drop from bed,  LUE Deficits / Details: about the same as the right, decreased control  for actively using the arm.    Lower Extremity Assessment Lower Extremity Assessment: RLE deficits/detail RLE Deficits / Details:  patient does not attempt to move the legs. LLE Deficits / Details: kas [facility] LLE: Unable to fully assess due to pain       Communication      Cognition Arousal/Alertness: Lethargic Behavior During Therapy: Restless Overall Cognitive Status: Difficult to assess                                 General Comments: patient is not responding except to pain. daughter spoke for patient      General Comments      Exercises      Assessment/Plan    PT Assessment Patient needs continued PT services  PT Problem List Decreased strength;Decreased range of motion;Decreased activity tolerance;Decreased mobility;Decreased knowledge of precautions;Decreased safety awareness;Decreased knowledge of use of DME       PT Treatment Interventions Functional mobility training;Therapeutic activities;Patient/family education    PT Goals (Current goals can be found in the Care Plan section)  Acute Rehab PT Goals Patient Stated Goal: per daughter to go to rehab. PT Goal Formulation: With patient/family Time For Goal Achievement: 10/03/16 Potential to Achieve Goals: Fair    Frequency Min 3X/week   Barriers to discharge        Co-evaluation               AM-PAC PT "6 Clicks" Daily Activity  Outcome Measure Difficulty turning over in bed (including adjusting bedclothes, sheets and blankets)?: Total Difficulty moving from lying on back to sitting on the side of the bed? : Total Difficulty sitting down on and standing up from a chair with arms (e.g., wheelchair, bedside commode, etc,.)?: Total Help needed moving to and from a bed to chair (including a wheelchair)?: Total Help needed walking in hospital room?: Total Help needed climbing 3-5 steps with a railing? : Total 6 Click Score: 6    End of Session   Activity Tolerance: Patient limited by lethargy;Patient limited by pain Patient left: in bed;with call bell/phone within reach;with bed alarm set;with family/visitor present Nurse Communication: Mobility status;Need for lift equipment PT Visit Diagnosis: History of falling (Z91.81)    Time: 4709-6283 PT Time Calculation (min) (ACUTE ONLY): 32 min   Charges:   PT Evaluation $PT Eval Moderate Complexity: 1 Procedure PT Treatments $Therapeutic Activity: 8-22 mins   PT G CodesTresa Vasquez PT [phone]  Kerri Vasquez 09/19/2016, 2:35 PM

## 2016-09-19 NOTE — Progress Notes (Signed)
  Echocardiogram 2D Echocardiogram has been performed.  Donata Clay 09/19/2016, 3:47 PM

## 2016-09-19 NOTE — Op Note (Signed)
NAMEMarland Kitchen  SHELDON, AMARA NO.:  [id number]  MEDICAL RECORD NO.:  [id number]  LOCATION:  63                         FACILITY:  [facility]  PHYSICIAN:  Alta Corning, M.D.   DATE OF BIRTH:  December 05, 1955  DATE OF PROCEDURE:  09/18/2016 DATE OF DISCHARGE:                              OPERATIVE REPORT   PREOPERATIVE DIAGNOSIS:  Trimalleolar ankle fracture with displacement of the medial malleolus.  POSTOPERATIVE DIAGNOSIS:  Trimalleolar ankle fracture with displacement of the medial malleolus.  PROCEDURE: 1. Open reduction internal fixation of trimalleolar ankle fracture     with cannulated screw fixation of the medial malleolus and closed     treatment of the lateral and posterior malleolus. 2. Interpretation of multiple intraoperative fluoroscopic images.  SURGEON:  Alta Corning, MD.  ASSISTANT:  None.  ANESTHESIA:  General.  BRIEF HISTORY:  Ms. Marella Bile is a 61 year old female with a history of twisting injury to her left ankle.  She had stayed at home for a period of time with difficulty walking, presented to the emergency room about 10 days after her injury with displaced medial malleolus fracture.  X-rays showed that she had a small crack in the fibula and there was a small crack posteriorly.  We were consulted for management at that time, she was septic, she had big swelling in the ankle and I felt that she needed some time to get over her sepsis.  We did a compressive wrap with a posterior splint over the weekend, we brought her to the operating room for open reduction internal fixation of her fracture as she was complaining of right leg pain as well and there was no x-rays in the system of her right ankle, so we did plan to take some fluoroscopic images of her right leg as well.  DESCRIPTION OF PROCEDURE:  The patient was taken to the operating room. After adequate anesthesia was obtained with general anesthetic, the patient was placed supine  on the operating room table.  The left leg was prepped and draped in usual sterile fashion.  Following this, the leg was exsanguinated.  Blood pressure inflated to 250 mmHg with a calf tourniquet.  Following this, an incision was made over the medial side of subcutaneous tissue down the level of the medial tibia.  We made a generous incision to get access to the medial side and once we got access to the medial side, we were able to do debridement of the healing elements and did a manipulative closed reduction and got a reasonable reduction.  We were very close to anatomic.  Two cannulated screws were what we used that gave [place] good fixation of this medial side.  We then took some images to make sure that the lateral fibula was not displaced and it was not and the posterior malleolus was not displaced, it was not and so at this point, we accepted that we had adequate fixation.  The wound was irrigated, suctioned dry, closed in layers.  Attention was then turned to the right leg, where we did a full imaging of her ankle, mid tibia, and knee and then both in AP and lateral directions did not see any evidence of fracture or problem over there.  Once  this was done, the right leg was placed into a U and posterior splint and at that point, she was taken to the recovery room and she noted be in satisfactory condition.  Estimated blood loss for procedure was minimal.     Alta Corning, M.D.     Corliss Skains  D:  09/18/2016  T:  09/19/2016  Job:  [id number]

## 2016-09-19 NOTE — NC FL2 (Signed)
[place] LEVEL OF CARE SCREENING TOOL     IDENTIFICATION  Patient Name: Kerri Vasquez Birthdate: April 06, 1956 Sex: female Admission Date (Current Location): 09/14/2016  [facility] [place] and [place] Number:  Herbalist and Address:  [facility],  [place], [place]      Provider Number: [id number]  Attending Physician Name and Address:  Isaac Bliss, [place]  Relative Name and Phone Number:       Current Level of Care: Hospital Recommended Level of Care: [place] Prior Approval Number:    Date Approved/Denied:   PASRR Number: [id number]  Discharge Plan: SNF    Current Diagnoses: Patient Active Problem List   Diagnosis Date Noted  . Pressure injury of skin 09/15/2016  . Acute lower UTI 09/14/2016  . Closed fracture of left distal tibia   . Sepsis ([place]) 03/15/2016  . Acute respiratory failure with hypoxia ([place]) 03/14/2016  . Tachycardia 03/14/2016  . Decubitus ulcer of sacral region, stage 3 ([place]) 03/14/2016  . CAP (community acquired pneumonia) 03/14/2016  . Stage 4 decubitus ulcer ([place]) 02/03/2016  . Rib fracture 02/02/2016  . Fall 02/02/2016  . Iron deficiency anemia due to chronic blood loss 07/29/2015  . 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 Elevated troponin 04/20/2015  . Chest discomfort 04/20/2015  . Vomiting 04/19/2015  . Intractable nausea and vomiting 04/19/2015  . Acute kidney injury superimposed on chronic kidney disease ([place]) 04/19/2015  . Primary malignant neoplasm of breast ([place]) 12/07/2014  . Takotsubo syndrome 09/04/2014  . Chronic diastolic CHF (congestive heart failure) ([place]) 09/04/2014  . Acute on chronic diastolic CHF (congestive heart failure) ([place]) 09/04/2014  . Apical ballooning syndrome 09/04/2014  . Dehydration   . Peripheral edema   . AKI (acute kidney injury) ([place]) 08/26/2014  . Chronic systolic heart failure ([place]) 08/13/2014  . DNR (do not resuscitate)  discussion 07/15/2014  . Weakness generalized 07/15/2014  . Palliative care encounter 07/15/2014  . Chronic pain syndrome 07/15/2014  . Acute systolic heart failure ([place]) 07/14/2014  . Dyspnea   . Congestive dilated cardiomyopathy ([place])   . Shock circulatory ([place]) 07/13/2014  . Encephalopathy acute 07/13/2014  . Pleural effusion 07/11/2014  . DM2 (diabetes mellitus, type 2) ([place]) 07/11/2014  . Essential hypertension 07/11/2014  . Hypothyroidism 07/11/2014  . NSTEMI (non-ST elevated myocardial infarction) ([place]) 07/11/2014  . SOB (shortness of breath) 07/11/2014  . Acquired hypothyroidism 07/11/2014  . Chest pain   . Other specified hypotension   . Breast cancer metastasized to liver ([place])   . Fibrositis 08/06/2013  . Diabetes mellitus ([place]) 08/06/2013  . Fibromyalgia 08/06/2013  . Personal history of other diseases of the digestive system 08/06/2013  . Obstructive apnea 08/06/2013  . Lumbar canal stenosis 10/25/2012  . Degeneration of intervertebral disc of lumbosacral region 10/05/2012  . Rheumatoid arthritis ([place]) 10/05/2012  . 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 Pernicious anemia 05/30/2011  . Anemia 05/29/2011  . Sleep apnea 05/29/2011  . Degenerative joint disease 05/16/2011  . Arthritis, degenerative 05/16/2011  . Adenocarcinoma, breast ([place]) 03/07/2011  . Rheumatoid arthritis(714.0) 03/07/2011  . Depression 03/07/2011  . Migraine 03/07/2011  . GERD (gastroesophageal reflux disease) 03/07/2011  . Acid reflux 03/07/2011  . Spinal stenosis with chronic low back pain     Orientation RESPIRATION BLADDER Height & Weight     Self, Situation, Place  Normal Incontinent, Indwelling catheter Weight: 298 lb 11.6 oz (135.5 kg) Height:  5\' 6"  (167.6 cm)  BEHAVIORAL SYMPTOMS/MOOD NEUROLOGICAL BOWEL NUTRITION STATUS      Continent Diet (heart healthy carb modified)  AMBULATORY STATUS COMMUNICATION OF NEEDS Skin   Extensive Assist Verbally PU Stage and  Appropriate Care (stage IV sacrum)                       Personal Care Assistance Level of Assistance  Bathing, Feeding, Dressing Bathing Assistance: Maximum assistance Feeding assistance: Maximum assistance Dressing Assistance: Maximum assistance     Functional Limitations Info             SPECIAL CARE FACTORS FREQUENCY  PT (By licensed PT), OT (By licensed OT)     PT Frequency: 5 times OT Frequency: 5 times            Contractures      Additional Factors Info  Code Status, Allergies Code Status Info: full code Allergies Info: Influenza Vac Split Flu Virus Vaccine, Pneumococcal Vaccines, Ritalin Methylphenidate Hcl, Zostavax Zoster Vaccine Live (Oka-merck), Albumin (Human), Adhesive Tape           Current Medications (09/19/2016):  This is the current hospital active medication list Current Facility-Administered Medications  Medication Dose Route Frequency Provider Last Rate Last Dose  . acetaminophen (TYLENOL) tablet 650 mg  650 mg Oral Q6H PRN Rise Patience, MD      . Chlorhexidine Gluconate Cloth 2 % PADS 6 each  6 each Topical Daily Isaac Bliss, Rayford Halsted, MD   6 each at 09/19/16 1110  . feeding supplement (ENSURE ENLIVE) (ENSURE ENLIVE) liquid 237 mL  237 mL Oral BID BM Isaac Bliss, Rayford Halsted, MD   237 mL at 09/19/16 1422  . feeding supplement (PRO-STAT SUGAR FREE 64) liquid 30 mL  30 mL Oral BID WC Isaac Bliss, Rayford Halsted, MD   30 mL at 09/19/16 0840  . fentaNYL (DURAGESIC - dosed mcg/hr) 100 mcg  100 mcg Transdermal Q48H Rise Patience, MD   100 mcg at 09/19/16 8938  . fludrocortisone (FLORINEF) tablet 100 mcg  100 mcg Oral BID WC Isaac Bliss, Rayford Halsted, MD      . FLUoxetine (PROZAC) capsule 40 mg  40 mg Oral QAC breakfast Rise Patience, MD   40 mg at 09/19/16 1017  . iopamidol (ISOVUE-300) 61 % injection 15 mL  15 mL Oral BID PRN Volanda Napoleon, MD   15 mL at 09/19/16 1110  . iopamidol (ISOVUE-300) 61 % injection            . levothyroxine (SYNTHROID, LEVOTHROID) tablet 300 mcg  300 mcg Oral QAC breakfast Isaac Bliss, Rayford Halsted, MD   300 mcg at 09/19/16 210 190 1603  . lipase/protease/amylase (CREON) capsule 24,000 Units  24,000 Units Oral TID AC Rise Patience, MD   24,000 Units at 09/19/16 1116  . meropenem (MERREM) 1 g in sodium chloride 0.9 % 100 mL IVPB  1 g Intravenous Q8H Luiz Ochoa, [place]   Stopped at 09/19/16 5852  . methocarbamol (ROBAXIN) tablet 750 mg  750 mg Oral TID PRN Rise Patience, MD   750 mg at 09/19/16 0210  . multivitamin with minerals tablet 1 tablet  1 tablet Oral Daily Rise Patience, MD   1 tablet at 09/19/16 0840  . nitroGLYCERIN (NITROSTAT) SL tablet 0.4 mg  0.4 mg Sublingual Q5 min PRN Rise Patience, MD      . ondansetron [facility]) tablet 4 mg  4 mg Oral Q6H PRN Rise Patience, MD  Or  . ondansetron (ZOFRAN) injection 4 mg  4 mg Intravenous Q6H PRN Rise Patience, MD   4 mg at 09/18/16 1215  . oxyCODONE (Oxy IR/ROXICODONE) immediate release tablet 20 mg  20 mg Oral Q4H PRN Rise Patience, MD   20 mg at 09/19/16 1116  . pantoprazole (PROTONIX) EC tablet 40 mg  40 mg Oral BID Rise Patience, MD   40 mg at 09/19/16 6815  . pregabalin (LYRICA) capsule 200 mg  200 mg Oral TID Rise Patience, MD   200 mg at 09/19/16 0840  . promethazine (PHENERGAN) tablet 25 mg  25 mg Oral Q6H PRN Rise Patience, MD      . sodium chloride flush (NS) 0.9 % injection 10-40 mL  10-40 mL Intracatheter Q12H Isaac Bliss, Rayford Halsted, MD   10 mL at 09/17/16 1025  . sodium chloride flush (NS) 0.9 % injection 10-40 mL  10-40 mL Intracatheter PRN Isaac Bliss, Rayford Halsted, MD   30 mL at 09/19/16 9470     Discharge Medications: Please see discharge summary for a list of discharge medications.  Relevant Imaging Results:  Relevant Lab Results:   Additional Information [id number]  Carlean Jews, LCSW

## 2016-09-19 NOTE — Progress Notes (Addendum)
Subjective: 1 Day Post-Op Procedure(s) (LRB): OPEN REDUCTION INTERNAL FIXATION (ORIF) LEFT ANKLE FRACTURE (Left) Patient reports pain as mild. The patient is a bit somnolent this morning, but arousable.  Objective: Vital signs in last 24 hours: Temp:  [97.5 F (36.4 C)-98.1 F (36.7 C)] 98.1 F (36.7 C) (05/22 0621) Pulse Rate:  [84-97] 85 (05/22 0621) Resp:  [7-20] 16 (05/22 0621) BP: (97-138)/(67-92) 105/84 (05/22 0621) SpO2:  [98 %-100 %] 99 % (05/22 0621) Weight:  [135.5 kg (298 lb 11.6 oz)] 135.5 kg (298 lb 11.6 oz) (05/22 0621)  Intake/Output from previous day: 05/21 0701 - 05/22 0700 In: 1600 [I.V.:1400; IV Piggyback:200] Out: 400 [Urine:400] Intake/Output this shift: No intake/output data recorded.   Recent Labs  09/17/16 0405  HGB 11.1*    Recent Labs  09/17/16 0405  WBC 9.5  RBC 3.38*  HCT 33.5*  PLT 199    Recent Labs  09/17/16 0405 09/18/16 0449  NA 137  --   K 3.2*  --   CL 110  --   CO2 15*  --   BUN 45*  --   CREATININE 1.38* 1.19*  GLUCOSE 107*  --   CALCIUM 8.0*  --    No results for input(s): LABPT, INR in the last 72 hours. Left ankle exam: Moves toes actively. Good capillary refill to toes. Posterior splint intact. Clean and dry.   Assessment/Plan: 1 Day Post-Op Procedure(s) (LRB): OPEN REDUCTION INTERNAL FIXATION (ORIF) LEFT ANKLE FRACTURE (Left) Plan: Okay to mobilize patient. Physical therapy ordered. Nonweightbearing on the left. Elevate left leg as much as possible. Will follow.  Donna G 09/19/2016, 8:21 AM

## 2016-09-19 NOTE — Progress Notes (Signed)
PROGRESS NOTE    Kerri Vasquez  ZOX:[id number] DOB: [birth year] DOA: 09/14/2016 PCP: [name], MD     Brief Narrative:  61 y/o woman admitted from home on 5/17 due to pain of her left leg following a fall 1.5 weeks ago. XRays show left ankle fracture involving the distal tibia and fibula. She was admitted to the ICU due to sepsis 2/2 UTI. Cx with ESBL Ecoli, on meropenem.   Assessment & Plan:   Principal Problem:   Sepsis ([place]) Active Problems:   Sleep apnea   Breast cancer metastasized to liver ([place])   Takotsubo syndrome   Acute lower UTI   Pressure injury of skin   Sepsis -Due to UTI. -Urine Cx with ESBL E Coli and klebsiella; Continue meropenem for 5 days. -Blood Cx 1/2 with GNR and CNS and acinetobacter (likely contaminant). -Sepsis parameters (hypotension, tachycardia) have resolved.  Left Ankle Fracture -S/p repair. -Management as per orthopedic surgery.  H/o Metastatic Breast Cancer -Last received chemo about 2 months ago. -Has lymphedema of the right arm. -Continue OP follow up with oncology as scheduled.  Hypothyroidism -TSH elevated at 5.6. -Synthroid has been increased from 275 to 300 mcg daily. -Will need repeat thyroid functions tests in 4-6 weeks.  Stage III CKD -Baseline Cr is around 1.5-1.7. -Currently at baseline to better than baseline. -No indications for dialysis.  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 -Continue florinef.  Elevated D Dimer -Believe this can be explained by her sepsis. -No need for r/o PE at this time. -She has not had CP/SOB or any other symptoms that could be attributed to a PE. Tachycardia and hypotension are likely from sepsis and have resolved with fluid administration.  Stage IV Sacral Decubitus Ulcer -Does not appear infected. -Appreciate wound care recommendations.  H/o Takatsubo Cardiomyopathy -Last ECHO in 11/17 was EF 81-19%, grade 1 diastolic dysfunction. -Minimally  elevated troponin. Likely from sepsis. They have remained flat, no acute EKG changes. -No further cardiac work up planned for at this time.   DVT prophylaxis: SQ heaprin Code Status: full code Family Communication: patient only Disposition Plan: likely back to SNF in 24-48 hours.  Consultants:   Orthopedics, Dr. Berenice Primas  Procedures:   None  Antimicrobials:  Anti-infectives    Start     Dose/Rate Route Frequency Ordered Stop   09/18/16 1230  ceFAZolin (ANCEF) IVPB 1 g/50 mL premix     1 g 100 mL/hr over 30 Minutes Intravenous  Once 09/18/16 1216 09/18/16 1203   09/18/16 1147  ceFAZolin (ANCEF) 2-4 GM/100ML-% IVPB    Comments:  Bridget Hartshorn   : cabinet override      09/18/16 1147 09/18/16 1200   09/18/16 0600  ceFAZolin (ANCEF) 3 g in dextrose 5 % 50 mL IVPB  Status:  Discontinued     3 g 130 mL/hr over 30 Minutes Intravenous On call to O.R. 09/17/16 1405 09/18/16 1215   09/17/16 1000  meropenem (MERREM) 1 g in sodium chloride 0.9 % 100 mL IVPB     1 g 200 mL/hr over 30 Minutes Intravenous Every 8 hours 09/17/16 0859     09/15/16 2200  cefTRIAXone (ROCEPHIN) 2 g in dextrose 5 % 50 mL IVPB  Status:  Discontinued     2 g 100 mL/hr over 30 Minutes Intravenous Every 24 hours 09/14/16 2150 09/17/16 0853   09/14/16 2145  cefTRIAXone (ROCEPHIN) 2 g in dextrose 5 % 50 mL IVPB     2 g 100 mL/hr over 30 Minutes  Intravenous  Once 09/14/16 2143 09/14/16 2235       Subjective: No issues, mild pain. Wants to eat.  Objective: Vitals:   09/18/16 1523 09/18/16 2200 09/19/16 0159 09/19/16 0621  BP: 124/79 97/76 105/71 105/84  Pulse: 88 89 92 85  Resp: 12  15 16   Temp: 97.7 F (36.5 C) 97.5 F (36.4 C) 97.9 F (36.6 C) 98.1 F (36.7 C)  TempSrc: Axillary Axillary Axillary Axillary  SpO2: 100% 100% 98% 99%  Weight:    135.5 kg (298 lb 11.6 oz)  Height:        Intake/Output Summary (Last 24 hours) at 09/19/16 1416 Last data filed at 09/19/16 0600  Gross per 24 hour  Intake               200 ml  Output              400 ml  Net             -200 ml   Filed Weights   09/17/16 0309 09/18/16 0338 09/19/16 0621  Weight: 132 kg (291 lb 0.1 oz) 123.8 kg (272 lb 14.9 oz) 135.5 kg (298 lb 11.6 oz)    Examination:   General exam: Alert, awake, oriented x 3, drowsy but can answer questions appropriately Respiratory system: Clear to auscultation. Respiratory effort normal. Cardiovascular system:RRR. No murmurs, rubs, gallops. Gastrointestinal system: Abdomen is nondistended, soft and nontender. No organomegaly or masses felt. Normal bowel sounds heard. Central nervous system: Alert and oriented. No focal neurological deficits. Extremities: No C/C/E, +pedal pulses Skin: No rashes, lesions or ulcers Psychiatry: Judgement and insight appear normal. Mood & affect appropriate.      Data Reviewed: I have personally reviewed following labs and imaging studies  CBC:  Recent Labs Lab 09/14/16 1952 09/15/16 0337 09/16/16 0517 09/17/16 0405  WBC 13.3* 10.1 8.6 9.5  NEUTROABS 12.2*  --   --   --   HGB 14.0 14.2 11.0* 11.1*  HCT 41.5 42.5 33.8* 33.5*  MCV 98.1 98.8 99.4 99.1  PLT 216 184 206 096   Basic Metabolic Panel:  Recent Labs Lab 09/14/16 1952 09/15/16 0337 09/16/16 0517 09/17/16 0405 09/18/16 0449  NA 139 138 141 137  --   K 4.4 4.7 3.5 3.2*  --   CL 109 110 113* 110  --   CO2 17* 14* 17* 15*  --   GLUCOSE 72 73 85 107*  --   BUN 66* 58* 50* 45*  --   CREATININE 1.70* 1.51* 1.31* 1.38* 1.19*  CALCIUM 8.8* 8.4* 8.1* 8.0*  --    GFR: Estimated Creatinine Clearance: 69.5 mL/min (A) (by C-G formula based on SCr of 1.19 mg/dL (H)). Liver Function Tests:  Recent Labs Lab 09/19/16 0800  AST 93*  ALT 29  ALKPHOS 403*  BILITOT 1.1  PROT 5.5*  ALBUMIN 2.1*   No results for input(s): LIPASE, AMYLASE in the last 168 hours.  Recent Labs Lab 09/19/16 0800  AMMONIA 105*   Coagulation Profile:  Recent Labs Lab 09/15/16 0030  INR 1.22    Cardiac Enzymes:  Recent Labs Lab 09/15/16 0030 09/15/16 1004 09/15/16 1842 09/16/16 0006  TROPONINI 0.04* 0.05* 0.03* 0.03*   BNP (last 3 results) No results for input(s): PROBNP in the last 8760 hours. HbA1C: No results for input(s): HGBA1C in the last 72 hours. CBG:  Recent Labs Lab 09/18/16 1329  GLUCAP 100*   Lipid Profile: No results for input(s): CHOL, HDL, LDLCALC, TRIG, CHOLHDL,  LDLDIRECT in the last 72 hours. Thyroid Function Tests: No results for input(s): TSH, T4TOTAL, FREET4, T3FREE, THYROIDAB in the last 72 hours. Anemia Panel: No results for input(s): VITAMINB12, FOLATE, FERRITIN, TIBC, IRON, RETICCTPCT in the last 72 hours. Urine analysis:    Component Value Date/Time   COLORURINE AMBER (A) 09/14/2016 2011   APPEARANCEUR CLOUDY (A) 09/14/2016 2011   LABSPEC 1.020 09/14/2016 2011   PHURINE 5.0 09/14/2016 2011   GLUCOSEU NEGATIVE 09/14/2016 2011   HGBUR LARGE (A) 09/14/2016 2011   BILIRUBINUR NEGATIVE 09/14/2016 2011   BILIRUBINUR negative 05/24/2011 1630   KETONESUR 20 (A) 09/14/2016 2011   PROTEINUR 100 (A) 09/14/2016 2011   UROBILINOGEN 0.2 08/26/2014 1850   NITRITE POSITIVE (A) 09/14/2016 2011   LEUKOCYTESUR LARGE (A) 09/14/2016 2011   Sepsis Labs: @LABRCNTIP (procalcitonin:4,lacticidven:4)  ) Recent Results (from the past 240 hour(s))  Culture, blood (routine x 2)     Status: Abnormal   Collection Time: 09/14/16  8:11 PM  Result Value Ref Range Status   Specimen Description BLOOD LEFT ANTECUBITAL  Final   Special Requests   Final    BOTTLES DRAWN AEROBIC AND ANAEROBIC Blood Culture adequate volume   Culture  Setup Time   Final    GRAM NEGATIVE RODS AEROBIC BOTTLE ONLY D. ZEIGLER, [place] ([place]) AT 3762 ON 09/15/16 BY C. JESSUP, MLT. GRAM POSITIVE COCCI IN CLUSTERS ANAEROBIC BOTTLE ONLY CRITICAL RESULT CALLED TO, READ BACK BY AND VERIFIED WITH: N GLOGOVAC,PHARMD AT 1951 09/16/16 BY L BENFIELD    Culture (A)  Final    ACINETOBACTER  SPECIES STAPHYLOCOCCUS SPECIES (COAGULASE NEGATIVE) THE SIGNIFICANCE OF ISOLATING THIS ORGANISM FROM A SINGLE SET OF BLOOD CULTURES WHEN MULTIPLE SETS ARE DRAWN IS UNCERTAIN. PLEASE NOTIFY THE MICROBIOLOGY DEPARTMENT WITHIN ONE WEEK IF SPECIATION AND SENSITIVITIES ARE REQUIRED. Performed at [facility], [place] [address], [place], [place] [address]    Report Status 09/17/2016 FINAL  Final   Organism ID, Bacteria ACINETOBACTER SPECIES  Final      Susceptibility   Acinetobacter species - MIC*    CEFTAZIDIME <=1 SENSITIVE Sensitive     CEFTRIAXONE 8 SENSITIVE Sensitive     CIPROFLOXACIN <=0.25 SENSITIVE Sensitive     GENTAMICIN <=1 SENSITIVE Sensitive     IMIPENEM <=0.25 SENSITIVE Sensitive     PIP/TAZO >=128 RESISTANT Resistant     TRIMETH/SULFA <=20 SENSITIVE Sensitive     CEFEPIME <=1 SENSITIVE Sensitive     AMPICILLIN/SULBACTAM <=2 SENSITIVE Sensitive     * ACINETOBACTER SPECIES  Urine culture     Status: Abnormal   Collection Time: 09/14/16  8:11 PM  Result Value Ref Range Status   Specimen Description URINE, RANDOM  Final   Special Requests NONE  Final   Culture (A)  Final    >=100,000 COLONIES/mL ESCHERICHIA COLI Confirmed Extended Spectrum Beta-Lactamase Producer (ESBL) >=100,000 COLONIES/mL KLEBSIELLA OXYTOCA    Report Status 09/18/2016 FINAL  Final   Organism ID, Bacteria ESCHERICHIA COLI (A)  Final   Organism ID, Bacteria KLEBSIELLA OXYTOCA (A)  Final      Susceptibility   Escherichia coli - MIC*    AMPICILLIN >=32 RESISTANT Resistant     CEFAZOLIN >=64 RESISTANT Resistant     CEFTRIAXONE >=64 RESISTANT Resistant     CIPROFLOXACIN >=4 RESISTANT Resistant     GENTAMICIN >=16 RESISTANT Resistant     IMIPENEM <=0.25 SENSITIVE Sensitive     NITROFURANTOIN <=16 SENSITIVE Sensitive     TRIMETH/SULFA >=320 RESISTANT Resistant     AMPICILLIN/SULBACTAM >=32  RESISTANT Resistant     PIP/TAZO 8 SENSITIVE Sensitive     Extended ESBL POSITIVE Resistant     * >=100,000  COLONIES/mL ESCHERICHIA COLI   Klebsiella oxytoca - MIC*    AMPICILLIN >=32 RESISTANT Resistant     CEFAZOLIN >=64 RESISTANT Resistant     CEFTRIAXONE <=1 SENSITIVE Sensitive     CIPROFLOXACIN <=0.25 SENSITIVE Sensitive     GENTAMICIN <=1 SENSITIVE Sensitive     IMIPENEM <=0.25 SENSITIVE Sensitive     NITROFURANTOIN <=16 SENSITIVE Sensitive     TRIMETH/SULFA <=20 SENSITIVE Sensitive     AMPICILLIN/SULBACTAM 16 INTERMEDIATE Intermediate     PIP/TAZO <=4 SENSITIVE Sensitive     Extended ESBL NEGATIVE Sensitive     * >=100,000 COLONIES/mL KLEBSIELLA OXYTOCA  Blood Culture ID Panel (Reflexed)     Status: None   Collection Time: 09/14/16  8:11 PM  Result Value Ref Range Status   Enterococcus species NOT DETECTED NOT DETECTED Final   Listeria monocytogenes NOT DETECTED NOT DETECTED Final   Staphylococcus species NOT DETECTED NOT DETECTED Final   Staphylococcus aureus NOT DETECTED NOT DETECTED Final   Streptococcus species NOT DETECTED NOT DETECTED Final   Streptococcus agalactiae NOT DETECTED NOT DETECTED Final   Streptococcus pneumoniae NOT DETECTED NOT DETECTED Final   Streptococcus pyogenes NOT DETECTED NOT DETECTED Final   Acinetobacter baumannii NOT DETECTED NOT DETECTED Final   Enterobacteriaceae species NOT DETECTED NOT DETECTED Final   Enterobacter cloacae complex NOT DETECTED NOT DETECTED Final   Escherichia coli NOT DETECTED NOT DETECTED Final   Klebsiella oxytoca NOT DETECTED NOT DETECTED Final   Klebsiella pneumoniae NOT DETECTED NOT DETECTED Final   Proteus species NOT DETECTED NOT DETECTED Final   Serratia marcescens NOT DETECTED NOT DETECTED Final   Haemophilus influenzae NOT DETECTED NOT DETECTED Final   Neisseria meningitidis NOT DETECTED NOT DETECTED Final   Pseudomonas aeruginosa NOT DETECTED NOT DETECTED Final   Candida albicans NOT DETECTED NOT DETECTED Final   Candida glabrata NOT DETECTED NOT DETECTED Final   Candida krusei NOT DETECTED NOT DETECTED Final    Candida parapsilosis NOT DETECTED NOT DETECTED Final   Candida tropicalis NOT DETECTED NOT DETECTED Final    Comment: Performed at [facility], [address], [place], [place] [address]  Blood Culture ID Panel (Reflexed)     Status: Abnormal   Collection Time: 09/14/16  8:11 PM  Result Value Ref Range Status   Enterococcus species NOT DETECTED NOT DETECTED Final   Listeria monocytogenes NOT DETECTED NOT DETECTED Final   Staphylococcus species DETECTED (A) NOT DETECTED Final    Comment: Methicillin (oxacillin) susceptible coagulase negative staphylococcus. Possible blood culture contaminant (unless isolated from more than one blood culture draw or clinical case suggests pathogenicity). No antibiotic treatment is indicated for blood  culture contaminants. CRITICAL RESULT CALLED TO, READ BACK BY AND VERIFIED WITH: N GLOGOVAC,PHARMD AT 1951 09/16/16 BY L BENFIELD    Staphylococcus aureus NOT DETECTED NOT DETECTED Final   Methicillin resistance NOT DETECTED NOT DETECTED Final   Streptococcus species NOT DETECTED NOT DETECTED Final   Streptococcus agalactiae NOT DETECTED NOT DETECTED Final   Streptococcus pneumoniae NOT DETECTED NOT DETECTED Final   Streptococcus pyogenes NOT DETECTED NOT DETECTED Final   Acinetobacter baumannii NOT DETECTED NOT DETECTED Final   Enterobacteriaceae species NOT DETECTED NOT DETECTED Final   Enterobacter cloacae complex NOT DETECTED NOT DETECTED Final   Escherichia coli NOT DETECTED NOT DETECTED Final   Klebsiella oxytoca NOT DETECTED NOT DETECTED  Final   Klebsiella pneumoniae NOT DETECTED NOT DETECTED Final   Proteus species NOT DETECTED NOT DETECTED Final   Serratia marcescens NOT DETECTED NOT DETECTED Final   Haemophilus influenzae NOT DETECTED NOT DETECTED Final   Neisseria meningitidis NOT DETECTED NOT DETECTED Final   Pseudomonas aeruginosa NOT DETECTED NOT DETECTED Final   Candida albicans NOT DETECTED NOT DETECTED Final   Candida glabrata NOT  DETECTED NOT DETECTED Final   Candida krusei NOT DETECTED NOT DETECTED Final   Candida parapsilosis NOT DETECTED NOT DETECTED Final   Candida tropicalis NOT DETECTED NOT DETECTED Final    Comment: Performed at [facility], [place] [address] [place], [place] [address]  Culture, blood (routine x 2)     Status: None (Preliminary result)   Collection Time: 09/15/16 12:30 AM  Result Value Ref Range Status   Specimen Description BLOOD RIGHT ARM  Final   Special Requests   Final    BOTTLES DRAWN AEROBIC AND ANAEROBIC Blood Culture adequate volume   Culture   Final    NO GROWTH 4 DAYS Performed at [facility], [place] [address], [place], [place] [address]    Report Status PENDING  Incomplete  MRSA PCR Screening     Status: None   Collection Time: 09/15/16  2:55 AM  Result Value Ref Range Status   MRSA by PCR NEGATIVE NEGATIVE Final    Comment:        The GeneXpert MRSA Assay (FDA approved for NASAL specimens only), is one component of a comprehensive MRSA colonization surveillance program. It is not intended to diagnose MRSA infection nor to guide or monitor treatment for MRSA infections.   Surgical PCR screen     Status: None   Collection Time: 09/18/16 12:03 AM  Result Value Ref Range Status   MRSA, PCR NEGATIVE NEGATIVE Final   Staphylococcus aureus NEGATIVE NEGATIVE Final    Comment:        The Xpert SA Assay (FDA approved for NASAL specimens in patients over 82 years of age), is one component of a comprehensive surveillance program.  Test performance has been validated by [facility] for patients greater than or equal to 15 year old. It is not intended to diagnose infection nor to guide or monitor treatment.          Radiology Studies: Dg Tibia/fibula Right  Result Date: 09/18/2016 CLINICAL DATA:  History of left ankle fracture, right tibia and fibula pain EXAM: RIGHT TIBIA AND FIBULA - 2 VIEW COMPARISON:  02/08/2016 FINDINGS: Eight low resolution  intraoperative spot views of the right tibia and fibula obtained. Total fluoroscopy time was 0.5 minutes. No obvious fracture or malalignment allowing for limited resolution. There is a rectangular opacity on the later images projecting over the soft tissues of the mid lower leg. IMPRESSION: 1. No gross fracture malalignment, follow-up dedicated views of the right tibia and fibula as indicated 2. Rectangular opacity projects over the soft tissues of the mid lower leg, clinical correlation recommended. Electronically Signed   By: Donavan Foil M.D.   On: 09/18/2016 13:59   Dg Ankle Complete Left  Result Date: 09/18/2016 CLINICAL DATA:  61 year old female with left ankle fracture. Subsequent encounter. EXAM: LEFT ANKLE COMPLETE - 3+ VIEW COMPARISON:  09/14/2016. FINDINGS: Four intraoperative C-arm views submitted for review after surgery. 2 pins utilized to transfix medial malleolar fracture. No complication noted. IMPRESSION: Open reduction and internal fixation left medial malleolar fracture. Electronically Signed   By: Genia Del  M.D.   On: 09/18/2016 13:39   Dg C-arm 1-60 Min-no Report  Result Date: 09/18/2016 Fluoroscopy was utilized by the requesting physician.  No radiographic interpretation.        Scheduled Meds: . Chlorhexidine Gluconate Cloth  6 each Topical Daily  . feeding supplement (ENSURE ENLIVE)  237 mL Oral BID BM  . feeding supplement (PRO-STAT SUGAR FREE 64)  30 mL Oral BID WC  . fentaNYL  100 mcg Transdermal Q48H  . fludrocortisone  100 mcg Oral BID WC  . FLUoxetine  40 mg Oral QAC breakfast  . iopamidol      . levothyroxine  300 mcg Oral QAC breakfast  . lipase/protease/amylase  24,000 Units Oral TID AC  . multivitamin with minerals  1 tablet Oral Daily  . pantoprazole  40 mg Oral BID  . pregabalin  200 mg Oral TID  . sodium chloride flush  10-40 mL Intracatheter Q12H   Continuous Infusions: . meropenem (MERREM) IV Stopped (09/19/16 0914)     LOS: 5 days     Time spent: 25 minutes. Greater than 50% of this time was spent in direct contact with the patient coordinating care.     Lelon Frohlich, MD [facility] Pager [phone]  If 7PM-7AM, please contact night-coverage [email or web address] Password [email or web address] 09/19/2016, 2:16 PM

## 2016-09-19 NOTE — Consult Note (Signed)
Referral MD  Reason for Referral: Metastatic breast cancer; bacteremia with Acinetobacter; urinary tract infection with Klebsiella and Escherichia coli; fracture of the left ankle   Chief Complaint  Patient presents with  . Fall  : Patient really unable to give complaint  HPI: Kerri Vasquez is well-known to me. She is a very nice 61 year old white female with metastatic breast cancer. She has multiple, multiple other health problems.  We actually have not seen her for several months and then she shows up in her office and late April. Her recent CA 27.29 is close to 1800. She had really been off therapy for close to 6 months.  She was to have started Verzenio. I talked to her daughter today by phone, and she says that this is been approved. She's not yet received it.  Kerri Vasquez was at home and apparently fell last week. She ultimately ended up in the emergency room. She had a fracture of the left ankle. This was repaired.  She had blood and urine cultures. Blood cultures are growing Acinetobacter. Her urine is growing Klebsiella and Escherichia coli. I think that she is on antibiotics with Merrem. This should cover all of the bacteria. Patient has some moments of clarity. For the most part, she has some confusion.  She does not have any hyper calcemic. I have to wonder what her ammonia is. I would probably check this.  She does not appear to be in any obvious pain right now.  I spoke to her daughter by phone. Obviously, the situation with her cancer is probably not good. I would check her CT scan. I suspect that she probably has fairly extensive metastatic disease.  We might be a situation that we need to consider hospice. I know this would be very difficult for the family to except.     Past Medical History:  Diagnosis Date  . Anemia   . Breast cancer, right breast ([place]) dx'd 2008   right  . CHF (congestive heart failure) ([place])   . Chronic lower back pain   . Depression   .  Diabetes mellitus    type II, controlled by diet 05/25/11   . Fibromyalgia   . Fracture of multiple toes    right toes x 4 toes- excluding small toe  . GERD (gastroesophageal reflux disease)   . Hypertension   . Hypothyroidism   . Incisional hernia   . Intestinal obstruction ([place])   . Iron deficiency anemia due to chronic blood loss 07/29/2015  . 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 Migraine    "weekly; but they don't last long" (08/26/2014)  . Myocardial infarction ([place]) 06/2014   S/P percutaneous intervention procedure a metastatic lesion in the liver  . Nausea and vomiting 04/2015  . Osteoarthritis of multiple joints   . Pernicious anemia 05/30/2011   Iron transfusion and VB12 on 06/06/11  . Renal insufficiency    hx renal failure 2011  . Sciatica   . Scoliosis   . Septic arthritis ([place]) 01/28/10   and spinal stenosis , moderate scoliosis   . Sleep apnea    hx of off machine x 4 years   . Spinal stenosis   . Thyroid cancer [facility]) 2013  :  Past Surgical History:  Procedure Laterality Date  . BREAST BIOPSY Right 2008  . BREAST IMPLANT REMOVAL Left 2015  . BREAST RECONSTRUCTION Bilateral 2008; 2009   "w/the mastectomy; "  . BREAST RECONSTRUCTION Left ~  2010   "it capsized"  . CARDIAC CATHETERIZATION    . CHOLECYSTECTOMY OPEN  06/29/85  . COLECTOMY  2011   "SBO"  . GASTRIC BYPASS  2006   "w/scope"  . IR GENERIC HISTORICAL  05/13/2014   IR RADIOLOGIST EVAL & MGMT 05/13/2014 Arne Cleveland, MD [facility] INTERV RAD  . IRRIGATION AND DEBRIDEMENT ABSCESS N/A 03/17/2016   Procedure: IRRIGATION AND DEBRIDIMENT SACRAL DECUBITUS ULCER;  Surgeon: Armandina Gemma, MD;  Location: [facility] ORS;  Service: General;  Laterality: N/A;  . LEFT HEART CATHETERIZATION WITH CORONARY ANGIOGRAM N/A 07/14/2014   Procedure: LEFT HEART CATHETERIZATION WITH CORONARY ANGIOGRAM;  Surgeon: Leonie Man, MD;  Location: [facility] CATH LAB;  Service: Cardiovascular;  Laterality: N/A;  . LIVER  BIOPSY  04/2015  . MASTECTOMY Bilateral 2008  . MULTIPLE EXTRACTIONS WITH ALVEOLOPLASTY  06/08/2011   Procedure: MULTIPLE EXTRACION WITH ALVEOLOPLASTY;  Surgeon: Lenn Cal, DDS;  Location: [facility];  Service: Oral Surgery;  Laterality: N/A;  Extraction of tooth #'s 3,4,7 with alveoloplasty and Gross Debridement of Remaining Teeth  . [place] LIVER TUMOR  2011; 01/26/2012  . THYROID LOBECTOMY  02/16/2012   Procedure: THYROID LOBECTOMY;  Surgeon: Earnstine Regal, MD;  Location: [facility];  Service: General;  Laterality: Right;  . TONSILLECTOMY  1969  :   Current Facility-Administered Medications:  .  acetaminophen (TYLENOL) tablet 650 mg, 650 mg, Oral, Q6H PRN **OR** [DISCONTINUED] acetaminophen (TYLENOL) suppository 650 mg, 650 mg, Rectal, Q6H PRN, Rise Patience, MD .  Chlorhexidine Gluconate Cloth 2 % PADS 6 each, 6 each, Topical, Daily, Isaac Bliss, Rayford Halsted, MD, 6 each at 09/18/16 1030 .  feeding supplement (ENSURE ENLIVE) (ENSURE ENLIVE) liquid 237 mL, 237 mL, Oral, BID BM, Isaac Bliss, Rayford Halsted, MD, 237 mL at 09/17/16 1024 .  feeding supplement (PRO-STAT SUGAR FREE 64) liquid 30 mL, 30 mL, Oral, BID WC, Isaac Bliss, Rayford Halsted, MD, 30 mL at 09/17/16 1645 .  fentaNYL (DURAGESIC - dosed mcg/hr) 100 mcg, 100 mcg, Transdermal, Q48H, Rise Patience, MD, 100 mcg at 09/17/16 1016 .  FLUoxetine (PROZAC) capsule 40 mg, 40 mg, Oral, QAC breakfast, Rise Patience, MD, 40 mg at 09/17/16 1018 .  levothyroxine (SYNTHROID, LEVOTHROID) tablet 300 mcg, 300 mcg, Oral, QAC breakfast, Isaac Bliss, Rayford Halsted, MD, 300 mcg at 09/18/16 0932 .  lipase/protease/amylase (CREON) capsule 24,000 Units, 24,000 Units, Oral, TID AC, Rise Patience, MD, 24,000 Units at 09/17/16 1937 .  meropenem (MERREM) 1 g in sodium chloride 0.9 % 100 mL IVPB, 1 g, Intravenous, Q8H, Luiz Ochoa, RPH, Stopped at 09/19/16 0249 .  methocarbamol (ROBAXIN) tablet 750 mg, 750 mg, Oral,  TID PRN, Rise Patience, MD, 750 mg at 09/19/16 0210 .  multivitamin with minerals tablet 1 tablet, 1 tablet, Oral, Daily, Rise Patience, MD, 1 tablet at 09/17/16 1018 .  nitroGLYCERIN (NITROSTAT) SL tablet 0.4 mg, 0.4 mg, Sublingual, Q5 min PRN, Rise Patience, MD .  ondansetron [facility]) tablet 4 mg, 4 mg, Oral, Q6H PRN **OR** ondansetron (ZOFRAN) injection 4 mg, 4 mg, Intravenous, Q6H PRN, Rise Patience, MD, 4 mg at 09/18/16 1215 .  oxyCODONE (Oxy IR/ROXICODONE) immediate release tablet 20 mg, 20 mg, Oral, Q4H PRN, Rise Patience, MD, 20 mg at 09/19/16 0210 .  pantoprazole (PROTONIX) EC tablet 40 mg, 40 mg, Oral, BID, Rise Patience, MD, 40 mg at 09/19/16 0210 .  pregabalin (LYRICA) capsule 200 mg, 200 mg, Oral, TID, Rise Patience, MD,  200 mg at 09/19/16 0210 .  promethazine (PHENERGAN) tablet 25 mg, 25 mg, Oral, Q6H PRN, Rise Patience, MD .  sodium chloride flush (NS) 0.9 % injection 10-40 mL, 10-40 mL, Intracatheter, Q12H, Isaac Bliss, Rayford Halsted, MD, 10 mL at 09/17/16 1025 .  sodium chloride flush (NS) 0.9 % injection 10-40 mL, 10-40 mL, Intracatheter, PRN, Isaac Bliss, Rayford Halsted, MD, 30 mL at 09/19/16 0432:  . Chlorhexidine Gluconate Cloth  6 each Topical Daily  . feeding supplement (ENSURE ENLIVE)  237 mL Oral BID BM  . feeding supplement (PRO-STAT SUGAR FREE 64)  30 mL Oral BID WC  . fentaNYL  100 mcg Transdermal Q48H  . FLUoxetine  40 mg Oral QAC breakfast  . levothyroxine  300 mcg Oral QAC breakfast  . lipase/protease/amylase  24,000 Units Oral TID AC  . multivitamin with minerals  1 tablet Oral Daily  . pantoprazole  40 mg Oral BID  . pregabalin  200 mg Oral TID  . sodium chloride flush  10-40 mL Intracatheter Q12H  :  Allergies  Allergen Reactions  . Influenza Vac Split [Flu Virus Vaccine] Other (See Comments)    Joint stiffness and renal failure  . Pneumococcal Vaccines Other (See Comments)    "sepsis and renal  failure"  . Ritalin [Methylphenidate Hcl]     "Heart attack"  . Zostavax [Zoster Vaccine Live ([employer])] Other (See Comments)    Joint stiffness, renal failure  . Albumin (Human)     Updating patient's allergy by adding albumin human so that medication interactions tied to albumin would fire. Tied to [id number] and [id number]. Patient's medication interactions were not firing because the albumin value below (06/26/2014) was not causing any medication interaction as [employer] had changed it to a bulk chemical rather than a drug ingredient.   . Adhesive [Tape] Itching and Rash  :  Family History  Problem Relation Age of Onset  . Cancer Mother        breast  . Hypertension Mother   . Anesthesia problems Daughter   :  Social History   Social History  . Marital status: Divorced    Spouse name: N/A  . Number of children: 1  . Years of education: N/A   Occupational History  . Not on file.   Social History Main Topics  . Smoking status: Never Smoker  . Smokeless tobacco: Never Used     Comment: NEVER USED TOBACCO  . Alcohol use No  . Drug use: No  . Sexual activity: Not Currently   Other Topics Concern  . Not on file   Social History Narrative   Caffeine use: 3 cups coffee/tea daily   Regular exercise: no        :  Pertinent items are noted in HPI.  Exam: Patient Vitals for the past 24 hrs:  BP Temp Temp src Pulse Resp SpO2 Weight  09/19/16 0621 105/84 98.1 F (36.7 C) Axillary 85 16 99 % 298 lb 11.6 oz (135.5 kg)  09/19/16 0159 105/71 97.9 F (36.6 C) Axillary 92 15 98 % -  09/18/16 2200 97/76 97.5 F (36.4 C) Axillary 89 - 100 % -  09/18/16 1523 124/79 97.7 F (36.5 C) Axillary 88 12 100 % -  09/18/16 1515 110/69 - - 84 10 100 % -  09/18/16 1500 109/70 - - 88 10 99 % -  09/18/16 1445 120/67 - - 89 (!) 9 99 % -  09/18/16 1430 109/68 - - 89 (!) 8 99 % -  09/18/16 1415 127/80 - - 86 (!) 7 98 % -  09/18/16 1400 127/85 - - 86 10 100 % -  09/18/16 1345 120/81 - - 89  10 100 % -  09/18/16 1330 137/80 - - 96 12 98 % -  09/18/16 1325 (!) 138/92 97.9 F (36.6 C) - 97 10 98 % -  09/18/16 1040 117/70 98.1 F (36.7 C) Axillary 85 20 - -    As above    Recent Labs  09/17/16 0405  WBC 9.5  HGB 11.1*  HCT 33.5*  PLT 199    Recent Labs  09/17/16 0405 09/18/16 0449  NA 137  --   K 3.2*  --   CL 110  --   CO2 15*  --   GLUCOSE 107*  --   BUN 45*  --   CREATININE 1.38* 1.19*  CALCIUM 8.0*  --     Blood smear review:  None  Pathology: None     Assessment and Plan:  Kerri Vasquez is a 61 year old white female. She has metastatic breast cancer. Her performance status is quite poor - ECOG 3-4.  I really believe that while she is in the hospital, we need to get studies to assess for her breast cancer. A CT scan would be appropriate.  She has a Port-A-Cath in. If she has a blood infection, she really needs to be treated as if she has endocarditis. He may need to consider an infectious disease consult to help out. I would clearly recommend IV antibiotics for at least 2-3 weeks.  I would get liver tests on her. I will get an ammonia level on her.  Again, I spoke to her daughter. I explained to her what thought was going on.  I appreciate everybody's care for Kerri Vasquez. I just believe that her cancer is becoming a real problem now. I think that even with the Verzenio, there is not a lot of "upside".  We will follow along and help out in any way possible.  Lattie Haw, MD  Acts 16:31

## 2016-09-19 NOTE — Clinical Social Work Note (Signed)
Clinical Social Work Assessment  Patient Details  Name: Kerri Vasquez MRN: [id number] Date of Birth: 1956/04/19  Date of referral:  09/19/16               Reason for consult:  Facility Placement                Permission sought to share information with:  Facility Sport and exercise psychologist, Family Supports Permission granted to share information::  Yes, Verbal Permission Granted  Name::        Agency::     Relationship::     Contact Information:     Housing/Transportation Living arrangements for the past 2 months:  Single Family Home (was in rehab for 7 months but home for two weeks before recent hospitalization) Source of Information:  Adult Children Patient Interpreter Needed:  None Criminal Activity/Legal Involvement Pertinent to Current Situation/Hospitalization:    Significant Relationships:  Adult Children, Other(Comment) (grandson) Lives with:  Adult Children Do you feel safe going back to the place where you live?    Need for family participation in patient care:  Yes (Comment)  Care giving concerns:  Patient's daughter reported that the facility patient was at ([facility]) lost her wheelchair.  She is awaiting a new one at this time.   Social Worker assessment / plan:  LCSW met with patient and her daughter at bedside.  Patient lethargic and just given pain medications per her daughter.  LCSW will send patient information to [facility]. Patient's daughter reported if patient cannot go to [facility] she will take her home  Employment status:  Retired Forensic scientist:  Medicare PT Recommendations:  [place] / Referral to community resources:     Patient/Family's Response to care:  Patient had just been given pain medication and could not stay awake.  Patient's daughter reported that patient had been in a rehab facility ([facility]) for 7 months and had only been home for two weeks before this recent hospitalization.  Patient's  daughter reported that she wanted patient to go to [facility] or [facility] otherwise she would take patient home with home health.    Patient/Family's Understanding of and Emotional Response to Diagnosis, Current Treatment, and Prognosis:  Patient lethargic and falling asleep but patient's daughter is aware of patient's multiple diagnoses and is awaiting results to clarify further  Emotional Assessment Appearance:  Appears stated age Attitude/Demeanor/Rapport:  Lethargic Affect (typically observed):  Appropriate Orientation:  Oriented to Self, Oriented to Place, Oriented to Situation Alcohol / Substance use:    Psych involvement (Current and /or in the community):     Discharge Needs  Concerns to be addressed:    Readmission within the last 30 days:    Current discharge risk:    Barriers to Discharge:      Kerri Jews, LCSW 09/19/2016, 4:54 PM

## 2016-09-20 DIAGNOSIS — A419 Sepsis, unspecified organism: Secondary | ICD-10-CM

## 2016-09-20 DIAGNOSIS — L89303 Pressure ulcer of unspecified buttock, stage 3: Secondary | ICD-10-CM

## 2016-09-20 DIAGNOSIS — R000 Tachycardia, unspecified: Secondary | ICD-10-CM

## 2016-09-20 DIAGNOSIS — C787 Secondary malignant neoplasm of liver and intrahepatic bile duct: Secondary | ICD-10-CM

## 2016-09-20 DIAGNOSIS — J90 Pleural effusion, not elsewhere classified: Secondary | ICD-10-CM

## 2016-09-20 LAB — CULTURE, BLOOD (ROUTINE X 2)
Culture: NO GROWTH
SPECIAL REQUESTS: ADEQUATE

## 2016-09-20 LAB — AMMONIA: Ammonia: 95 umol/L — ABNORMAL HIGH (ref 9–35)

## 2016-09-20 LAB — PREALBUMIN: Prealbumin: <5 mg/dL — ABNORMAL LOW (ref 18–38)

## 2016-09-20 MED ORDER — LACTULOSE 10 GM/15ML PO SOLN
20.0000 g | Freq: Two times a day (BID) | ORAL | Status: DC
  Administered 2016-09-20 – 2016-09-22 (×4): 20 g via ORAL
  Filled 2016-09-20 (×4): qty 30

## 2016-09-20 MED ORDER — HEPARIN SODIUM (PORCINE) 5000 UNIT/ML IJ SOLN
5000.0000 [IU] | Freq: Three times a day (TID) | INTRAMUSCULAR | Status: DC
  Administered 2016-09-20 – 2016-09-22 (×6): 5000 [IU] via SUBCUTANEOUS
  Filled 2016-09-20 (×6): qty 1

## 2016-09-20 MED ORDER — LACTULOSE ENEMA
300.0000 mL | Freq: Once | ORAL | Status: AC
  Administered 2016-09-20: 300 mL via RECTAL
  Filled 2016-09-20: qty 300

## 2016-09-20 MED FILL — fentaNYL 100 MCG/HR PT72: 100 | 30 days supply | Qty: 15 | Fill #0

## 2016-09-20 MED FILL — oxyCODONE HCL 20 MG TABS: 20 | 30 days supply | Qty: 180 | Fill #0

## 2016-09-20 NOTE — Progress Notes (Signed)
I think that is apparent as what is going on now. Her CT scan showed that her liver is markedly infiltrated with malignancy. I suspect she also has malignancy in her lungs. She has bilateral pleural effusions. She has some infiltrates. Again I suspect that she has extensive disease. Her CA 27.29 really corroborates this.  Her ammonia is 105. I think this is a clear indicator of hepatic dysfunction.  She really is no better mentally this morning. I think she got lactulose enema.  I spoke to her daughter this morning on the phone. I talked to her about the situation with her cancer. There really is nothing that we can offer her at this point that I think would have any meaningful impact. I know that we have ordered her the oral medication, Versenio. I think the chance of this working is probably less than 5%. Her daughter wants to try this.  I then discussed end-of-life issues with her daughter. Kerri Vasquez is in no condition to make a decision regarding her end-of-life care. I told her daughter that I just did not think that keeping her alive on machines or put her through CPR, defibrillation, etc. would be to her benefit. However, her daughter just cannot make a decision regarding this.  I told her daughter that I would be surprised if Kerri Vasquez survived through June. I know that she has a lot of "caloric reserve" so this might help. However, I just think that her liver is not going to work well and I think this will ultimately fail.  Her daughter understands all this.  I think we really need to get hospice involved. Possibly even getting palliative care involved this admission would not be a bad idea.  I really think that we have to focus on comfort care.  Kerri Vasquez and her daughter have an incredibly strong bond. It is obvious how much each means each other when I see her for her appointments. I have known Kerri Vasquez for many years. I appreciate the difficulty that her daughter has in  making this decision.  I tried to reassure her that even if she was made a DO NOT RESUSCITATE, that we would still take care of her and try to correct what we could.  I will order a prealbumin on her. This may help give [place] some additional information.  I just feel bad for Kerri Vasquez. I know she has a lot of other health issues. I know that her overall quality of life has never been all that great because of really really bad arthritis. I would just hate to see her exist and have even less quality.  Kerri Haw, MD  Jude 1:20

## 2016-09-20 NOTE — Progress Notes (Signed)
Pharmacy Antibiotic Note  Kerri Vasquez is a 61 y.o. female admitted on 09/14/2016 with sepsis secondary to UTI. Patient initially placed on Ceftriaxone. However, urine culture now showing ESBL, so antibiotics changed to Meropenem. Blood cultures (both CoNS and Acinetobacter) -- MD suspects likely contaminants.  Today, 09/20/2016: - day #4 meropenem.  Plan is to treat for 5 days - afeb, wbc wnl - scr down 1.19 (crcl~55N)  Plan: - continue Meropenem 1g IV q8h.  _______________________________  Height: 5\' 6"  (167.6 cm) Weight: (!) 305 lb 1.9 oz (138.4 kg) IBW/kg (Calculated) : 59.3  Temp (24hrs), Avg:98.3 F (36.8 C), Min:98 F (36.7 C), Max:98.4 F (36.9 C)   Recent Labs Lab 09/14/16 1952 09/14/16 2000 09/15/16 0337 09/15/16 1004 09/16/16 0517 09/17/16 0405 09/18/16 0449  WBC 13.3*  --  10.1  --  8.6 9.5  --   CREATININE 1.70*  --  1.51*  --  1.31* 1.38* 1.19*  LATICACIDVEN  --  2.82*  --  1.6  --   --   --     Estimated Creatinine Clearance: 70.3 mL/min (A) (by C-G formula based on SCr of 1.19 mg/dL (H)).    Allergies  Allergen Reactions  . Influenza Vac Split [Flu Virus Vaccine] Other (See Comments)    Joint stiffness and renal failure  . Pneumococcal Vaccines Other (See Comments)    "sepsis and renal failure"  . Ritalin [Methylphenidate Hcl]     "Heart attack"  . Zostavax [Zoster Vaccine Live ([employer])] Other (See Comments)    Joint stiffness, renal failure  . Albumin (Human)     Updating patient's allergy by adding albumin human so that medication interactions tied to albumin would fire. Tied to [id number] and [id number]. Patient's medication interactions were not firing because the albumin value below (06/26/2014) was not causing any medication interaction as [employer] had changed it to a bulk chemical rather than a drug ingredient.   . Adhesive [Tape] Itching and Rash    Antimicrobials this admission: 5/17 ceftriaxone >>5/20 5/20 meropenem  >>  Microbiology results: 5/17 BCx at 2011: Acinetobacter species in aerobic bottle of one set (1/4 bottles) -->S to all abx tested except Zosyn; CoNS in anaerobic bottle of one set (1/4 bottles) - ? Contaminant FINAL 5/18 bcx at 0030:  5/17 UCx: > 100K ESBL E.coli, > 100K Klebsiella oxytoca FINAL 5/18 MRSA PCR: negative  Thank you for allowing pharmacy to be a part of this patient's care.    Dia Sitter, PharmD, BCPS 09/20/2016 8:17 AM

## 2016-09-20 NOTE — Consult Note (Signed)
Consultation Note Date: 09/20/2016   Patient Name: Kerri Vasquez  DOB: 1955-08-04  MRN: [id number]  Age / Sex: 61 y.o., female  PCP: [name], MD Referring Physician: Charlynne Cousins, MD  Reason for Consultation: Disposition, Establishing goals of care and Psychosocial/spiritual support  HPI/Patient Profile: 61 y.o. female  with past medical history of metastatic breast cancer (was followed by Dr. Marin Olp, however required SNF care from November through April, and during this time did not follow with Oncology. She stopped taking Xeloda in Nov 2017. On discharge from SNF she then presented to Onc due to need for medication refills. Labs revealed CA 27.29 1,767. Plan for PET and initiation of Verzenio). She has has a hx of cardiomyopathy with recovered EF, hypothyroidism, gastric bypass surgery, and severe osteoarthritis with chronic pain. She presented to the ED with left leg pain after falling, and was subsequently admitted on 09/14/2016 . Imaging revealed a left ankle fracture involving the distal tibia and fibula. She was also found to have sepsis (ESBL E. Coli) from a UTI. Fracture repaired and sepsis resolved. Oncology also consulted, and imaging studies to assess breast cancer status revealed marked liver infiltration with indicators of hepatic dysfunction (ammonia 105), progression of peritoneal disease, and new osseous metastases. Her Oncologist feels she will likely not survive through June. Palliative consulted to assist pt and family in goals of care.  Clinical Assessment and Goals of Care: Kerri Vasquez struggles to participate in a goals of care conversation due to mild encephalopathy. I met with her and her daughter at the bedside, and was able to talk through the information Dr. Marin Olp had provided. Kerri Vasquez was not aware of the prognosis and was extremely tearful as she and her daughter discussed it. After consideration of  options, she did feel that being at home with the additional support of Hospice was the best option. I reviewed Hospice's care philosophy and the types of services they could provide. Both the patient and her daughter want her to be at home with full care provided by family. I expressed my concerns about the burden of that care, as well as the likelihood of progressive deterioration and increased symptom burden. They acknowledge the difficulty of being at home, but also wish to try.   Finally, we did talk about code status. Kerri Vasquez was very tearful and did not feel ready to consider changing her code status. Given the seriousness of our conversation and decision to transition to Hospice, I did not feel it was appropriate to push the matter. Kerri Vasquez plans to talk with her more about it once they are home. Kerri Vasquez does have a very good understanding of what Full code entails, and verbalized a clear understanding of the suffering it would cause her mother. That said, she needs to hear her mother say it is okay not to do those things (as she has previously expressed a strong desire for Full Code status).   Primary Decision Maker NEXT OF KIN, pt's daughter, [name]   SUMMARY OF RECOMMENDATIONS    Full code  Plan to transition home with Hospice care; [facility] evaluating case   Code Status/Advance Care Planning:  Full code  Palliative Prophylaxis:   Frequent Pain Assessment, Palliative Wound Care and Turn Reposition  Additional Recommendations (Limitations, Scope, Preferences):  Transition home with Hospice  Psycho-social/Spiritual:   Desire for further Chaplaincy support:no  Additional Recommendations: Education on Hospice  Prognosis:   < 4 weeks  Discharge Planning: Home with Hospice  Primary Diagnoses: Present on Admission: . Sepsis ([place]) . Takotsubo syndrome . Sleep apnea . Breast cancer metastasized to liver ([place]) . Acute lower UTI   I have reviewed the medical record,  interviewed the patient and family, and examined the patient. The following aspects are pertinent.  Past Medical History:  Diagnosis Date  . Anemia   . Breast cancer, right breast ([place]) dx'd 2008   right  . CHF (congestive heart failure) ([place])   . Chronic lower back pain   . Depression   . Diabetes mellitus    type II, controlled by diet 05/25/11   . Fibromyalgia   . Fracture of multiple toes    right toes x 4 toes- excluding small toe  . GERD (gastroesophageal reflux disease)   . Hypertension   . Hypothyroidism   . Incisional hernia   . Intestinal obstruction ([place])   . Iron deficiency anemia due to chronic blood loss 07/29/2015  . 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 Migraine    "weekly; but they don't last long" (08/26/2014)  . Myocardial infarction ([place]) 06/2014   S/P percutaneous intervention procedure a metastatic lesion in the liver  . Nausea and vomiting 04/2015  . Osteoarthritis of multiple joints   . Pernicious anemia 05/30/2011   Iron transfusion and VB12 on 06/06/11  . Renal insufficiency    hx renal failure 2011  . Sciatica   . Scoliosis   . Septic arthritis ([place]) 01/28/10   and spinal stenosis , moderate scoliosis   . Sleep apnea    hx of off machine x 4 years   . Spinal stenosis   . Thyroid cancer ([place]) 2013   Social History   Social History  . Marital status: Divorced    Spouse name: N/A  . Number of children: 1  . Years of education: N/A   Social History Main Topics  . Smoking status: Never Smoker  . Smokeless tobacco: Never Used     Comment: NEVER USED TOBACCO  . Alcohol use No  . Drug use: No  . Sexual activity: Not Currently   Other Topics Concern  . None   Social History Narrative   Caffeine use: 3 cups coffee/tea daily   Regular exercise: no         Family History  Problem Relation Age of Onset  . Cancer Mother        breast  . Hypertension Mother   . Anesthesia problems Daughter    Scheduled  Meds: . Chlorhexidine Gluconate Cloth  6 each Topical Daily  . feeding supplement (ENSURE ENLIVE)  237 mL Oral BID BM  . feeding supplement (PRO-STAT SUGAR FREE 64)  30 mL Oral BID WC  . fentaNYL  100 mcg Transdermal Q48H  . fludrocortisone  100 mcg Oral BID WC  . FLUoxetine  40 mg Oral QAC breakfast  . heparin subcutaneous  5,000 Units Subcutaneous Q8H  . levothyroxine  300 mcg Oral QAC breakfast  . lipase/protease/amylase  24,000 Units Oral TID AC  . multivitamin with minerals  1 tablet Oral Daily  . pantoprazole  40 mg Oral BID  . pregabalin  200 mg Oral TID  . sodium chloride flush  10-40 mL Intracatheter Q12H   Continuous Infusions: . meropenem (MERREM) IV Stopped (09/20/16 1041)   PRN Meds:.acetaminophen **OR** [DISCONTINUED] acetaminophen, iopamidol, methocarbamol, nitroGLYCERIN, ondansetron **OR** ondansetron (ZOFRAN) IV, oxyCODONE, promethazine, sodium chloride flush Allergies  Allergen Reactions  . Influenza Vac Split [  Flu Virus Vaccine] Other (See Comments)    Joint stiffness and renal failure  . Pneumococcal Vaccines Other (See Comments)    "sepsis and renal failure"  . Ritalin [Methylphenidate Hcl]     "Heart attack"  . Zostavax [Zoster Vaccine Live ([employer])] Other (See Comments)    Joint stiffness, renal failure  . Albumin (Human)     Updating patient's allergy by adding albumin human so that medication interactions tied to albumin would fire. Tied to [id number] and [id number]. Patient's medication interactions were not firing because the albumin value below (06/26/2014) was not causing any medication interaction as [employer] had changed it to a bulk chemical rather than a drug ingredient.   . Adhesive [Tape] Itching and Rash   Review of Systems  -Unable to obtain given pt's mentation. She inconsistently responds to question.  Physical Exam  Constitutional: Vital signs are normal. She appears lethargic. She has a sickly appearance. She appears distressed (very sad and  tearful). Nasal cannula in place.  Frail woman appears older than stated age  HENT:  Head: Normocephalic and atraumatic.  Mouth/Throat: Mucous membranes are dry. Abnormal dentition.  Eyes: EOM are normal.  Neck: Normal range of motion. Neck supple.  Cardiovascular: Regular rhythm.  Tachycardia present.   Pulmonary/Chest: Effort normal. No respiratory distress. She has decreased breath sounds in the right lower field and the left lower field. She has rhonchi.  Abdominal: Soft. Bowel sounds are normal.  Musculoskeletal: She exhibits edema.  Generalized weakness, left ankle s/p fracture repair  Neurological: She appears lethargic.  Oriented to self and place, confused on time. Limited understanding of situation.   Skin: Skin is warm and dry. There is pallor.  Psychiatric: Judgment and thought content normal. Her speech is delayed. She is slowed and withdrawn. Cognition and memory are impaired.  Flat affect   Vital Signs: BP 101/66 (BP Location: Left Wrist)   Pulse (!) 103   Temp 98 F (36.7 C) (Oral)   Resp 11   Ht '5\' 6"'  (1.676 m)   Wt (!) 138.4 kg (305 lb 1.9 oz)   LMP 08/30/2006   SpO2 98%   BMI 49.25 kg/m  Pain Assessment: No/denies pain   Pain Score: 7    SpO2: SpO2: 98 % O2 Device:SpO2: 98 % O2 Flow Rate: .O2 Flow Rate (L/min): 2 L/min  IO: Intake/output summary:  Intake/Output Summary (Last 24 hours) at 09/20/16 1122 Last data filed at 09/20/16 0200  Gross per 24 hour  Intake             1010 ml  Output                0 ml  Net             1010 ml    LBM: Last BM Date: 09/12/16 Baseline Weight: Weight: 130.6 kg (288 lb) Most recent weight: Weight: (!) 138.4 kg (305 lb 1.9 oz)     Palliative Assessment/Data: PPS 30-40%   Time in: 0800 Time out: 0850 Total time: 105 minutes Greater than 50%  of this time was spent counseling and coordinating care related to the above assessment and plan.  Signed by: [name], NP Palliative Medicine Team Pager #  [phone] (M-F 7a-5p) Team Phone # [phone] (Nights/Weekends)

## 2016-09-20 NOTE — Progress Notes (Addendum)
Palliative Care  Per provider notes and in speaking with care nurse, Ms. Marella Bile is not able to meaningfully participate in a goals of care conversations as she is not decisional due to encephalopathy. No family at bedside. I have left my name and contact information on voice-mails for her daughter, [name], on both the home and mobile numbers listed in the chart. I have also provided my contact information to the care nurse in the event [name] presents at bedside.   Plan Continue to try to contact pt's daughter to facilitate [place] meeting  ADDENDUM: I did hear back from [facility], plan to meet with her tomorrow at 0800.   [name] AGNP-C Palliative Care [phone]  No charge note

## 2016-09-20 NOTE — Progress Notes (Addendum)
[facility] PROGRESS NOTE    Progress Note  Kerri Vasquez  HWE:[id number] DOB: 1955/06/08 DOA: 09/14/2016 PCP: [name], MD     Brief Narrative:   Kerri Vasquez is an 61 y.o. female past medical history of stage IV breast cancer with a history of cardiomyopathy with an EF of 65% which has recovered and grade 1 diastolic heart failure, sacral decubitus ulcer who presents to the ED with increasing left ankle pain, she had been in rehabilitation until 2 weeks ago when she was discharged. She had a follow week prior to admission since then her left ankle has been hurting, she denies any loss of consciousness. In the ED and x-ray revealed left ankle fracture and distal tibia and fibula, rolling the ED she became hypotensive tachycardic and febrile, with an elevated lactic leukocytosis UA was compatible for UTI.  Assessment/Plan:   Sepsis ([place]) Due to UTI: Urine culture grew ESBL Escherichia coli and Klebsiella She has 1 more days of meropenem. Blood Cx 1/2 with GNR and CNS and acinetobacter (likely contaminant). I'll try to contact daughter several times and has been unsuccessful.  Left ankle fracture: Status post repair continue further management per orthopedic. She status post open reduction with internal fixation of trimalleolar ankle fracture.  History of metastatic breast cancer: Last chemotherapy about 2 months ago, oncology was consulted recommended to move towards hospice route. Consulted palliative care. I have attempted to contact her daughter several times and has been unsuccessful.   Hypothyroidism: TSH 5.6, will need a repeat TSH in 6 weeks.  Stage III chronic kidney disease: Creatinine seems to be at baseline.  Adrenal insufficiency: Questionable, she is on Florinef will need further follow-up with PCP.  Elevated d-dimer: There is likely due to sepsis, she is not tachycardic she denies any chest penetration of breath. Her  hypotension was likely due to sepsis.  Stage IV sacral decubitus ulcer: Continue wound care.  History of Takatsubo Cardiomyopathy Minimal elevation in her cardiac biomarkers likely from sepsis they have remained flat with no acute EKG changes. No further workup needed at this time.  Encephalopathy: Likely hepatic as  Her ammonia level high. Start lactulose.   DVT prophylaxis: lovenox Family Communication:none Disposition Plan/Barrier to D/C: unable to determine Code Status:     Code Status Orders        Start     Ordered   09/14/16 2340  Full code  Continuous     09/14/16 2340    Code Status History    Date Active Date Inactive Code Status Order ID Comments User Context   03/14/2016  8:14 PM 03/20/2016  7:30 PM Full Code [id number]  Etta Quill, DO ED   02/02/2016  8:22 PM 02/09/2016  8:51 PM Full Code [id number]  Florencia Reasons, MD Inpatient   04/20/2015  7:34 AM 04/21/2015  8:15 PM Full Code [id number]  Jonetta Osgood, MD Inpatient   04/19/2015  9:23 AM 04/20/2015  7:34 AM DNR [id number]  Radene Gunning, NP Inpatient   08/26/2014  9:33 PM 08/28/2014  5:33 PM Full Code [id number]  Francesca Oman, DO Inpatient   07/14/2014  9:11 AM 07/18/2014  6:42 PM Full Code [id number]  Leonie Man, MD Inpatient   07/11/2014 10:01 PM 07/14/2014  9:11 AM Full Code [id number]  Alric Quan, MD Inpatient   07/10/2014  6:06 PM 07/11/2014 10:01 PM Full Code [id number]  Arne Cleveland, MD Inpatient   02/16/2012  1:26 PM 02/17/2012 12:11 PM Full  Code [id number]  Yvonna Alanis, RN Inpatient        IV Access:    Peripheral IV   Procedures and diagnostic studies:   Dg Tibia/fibula Right  Result Date: 09/18/2016 CLINICAL DATA:  History of left ankle fracture, right tibia and fibula pain EXAM: RIGHT TIBIA AND FIBULA - 2 VIEW COMPARISON:  02/08/2016 FINDINGS: Eight low resolution intraoperative spot views of the right tibia and fibula obtained. Total fluoroscopy time was 0.5 minutes. No obvious  fracture or malalignment allowing for limited resolution. There is a rectangular opacity on the later images projecting over the soft tissues of the mid lower leg. IMPRESSION: 1. No gross fracture malalignment, follow-up dedicated views of the right tibia and fibula as indicated 2. Rectangular opacity projects over the soft tissues of the mid lower leg, clinical correlation recommended. Electronically Signed   By: Donavan Foil M.D.   On: 09/18/2016 13:59   Dg Ankle Complete Left  Result Date: 09/18/2016 CLINICAL DATA:  61 year old female with left ankle fracture. Subsequent encounter. EXAM: LEFT ANKLE COMPLETE - 3+ VIEW COMPARISON:  09/14/2016. FINDINGS: Four intraoperative C-arm views submitted for review after surgery. 2 pins utilized to transfix medial malleolar fracture. No complication noted. IMPRESSION: Open reduction and internal fixation left medial malleolar fracture. Electronically Signed   By: Genia Del M.D.   On: 09/18/2016 13:39   Ct Head W & Wo Contrast  Result Date: 09/19/2016 CLINICAL DATA:  [place] last week. History of metastatic breast cancer, migraine, hypertension, encephalopathy. EXAM: CT HEAD WITHOUT AND WITH CONTRAST TECHNIQUE: Contiguous axial images were obtained from the base of the skull through the vertex without and with intravenous contrast CONTRAST:  17mL ISOVUE-300 IOPAMIDOL (ISOVUE-300) INJECTION 61% COMPARISON:  CT HEAD Sep 14, 2016 and MRI of the head February 06, 2016 FINDINGS: BRAIN: No intraparenchymal hemorrhage, mass effect nor midline shift. The ventricles and sulci are normal for age. Patchy supratentorial white matter hypodensities. No acute large vascular territory infarcts. No abnormal extra-axial fluid collections. Basal cisterns are patent. No abnormal intracranial enhancement. VASCULAR: Moderate calcific atherosclerosis of the carotid siphons. SKULL: No skull fracture. No significant scalp soft tissue swelling. SINUSES/ORBITS: The mastoid air-cells and  included paranasal sinuses are well-aerated.The included ocular globes and orbital contents are non-suspicious. OTHER: None. IMPRESSION: No acute intracranial process or CT findings of metastatic disease. Mild to moderate white matter changes compatible chronic small vessel ischemic disease and, may be related to prior chemotherapy. Electronically Signed   By: Elon Alas M.D.   On: 09/19/2016 15:09   Ct Chest W Contrast  Result Date: 09/19/2016 CLINICAL DATA:  Metastatic breast cancer EXAM: CT CHEST, ABDOMEN, AND PELVIS WITH CONTRAST TECHNIQUE: Multidetector CT imaging of the chest, abdomen and pelvis was performed following the standard protocol during bolus administration of intravenous contrast. CONTRAST:  176mL ISOVUE-300 IOPAMIDOL (ISOVUE-300) INJECTION 61% COMPARISON:  CT chest, abdomen, and pelvis dated 02/02/2016. PET-CT dated 10/26/2015. FINDINGS: CT CHEST FINDINGS Cardiovascular: Heart is normal in size.  No pericardial effusion. No evidence of thoracic aortic aneurysm. Right chest port terminates at in the upper right atrium. Mediastinum/Nodes: No suspicious mediastinal or axillary lymphadenopathy. Lungs/Pleura: Evaluation lung parenchyma is constrained by respiratory motion. Multifocal patchy opacities in the bilateral upper lobes (series 12/ image 36), possibly reflecting mild interstitial edema, less likely multifocal infection. Moderate bilateral pleural effusions. Associated lower lobe opacities, likely atelectasis. No pneumothorax. Musculoskeletal: Right breast prosthesis. Degenerative changes of the visualized thoracolumbar spine. Right lateral 4th, 5th, and 7th through 9th rib fracture  deformities. Sclerotic osseous metastases, including the left T5 vertebral body (series 5/ image 21), left posterior 6th rib (series 5/ image 27), and right posterior 8th rib (series 5/ image 37), new. CT ABDOMEN PELVIS FINDINGS Hepatobiliary: Innumerable/diffuse hepatic metastases throughout both lobes.  Dominant/ discrete masses are difficult to measure on the current CT, partially due to motion degradation. However, the overall number has significantly increased. Status post cholecystectomy. No intrahepatic or extrahepatic duct dilatation. Pancreas: Within normal limits. Spleen: Within normal limits. Adrenals/Urinary Tract: Adrenal glands are within normal limits. Kidneys are within normal limits. 8 mm proximal left ureteral calculus (series 5/image 71). No hydronephrosis. Bladder is decompressed by indwelling Foley catheter with associated intraluminal gas. Stomach/Bowel: Postsurgical changes related to partial gastrectomy with gastrojejunostomy. Additional small bowel suture line in the right mid abdomen (series 5/image 66). No evidence of bowel obstruction. Appendix is not discretely visualized. Small right mid abdominal ventral hernia containing a loop of nondilated small bowel (series 5/image 67). Vascular/Lymphatic: No evidence of abdominal aortic aneurysm. No suspicious abdominopelvic lymphadenopathy. Reproductive: Status post hysterectomy. Bilateral ovaries are within normal limits. Other: Moderate abdominopelvic ascites. Associated peritoneal disease/ omental caking in the left upper abdomen (series 5/ image 49), new. Additional peritoneal disease/ omental caking in the right mid abdomen, measuring up to 5.9 x 1.8 cm (series 5/ image 62), previously 2.1 cm. Musculoskeletal: Degenerative changes of the lumbar spine, most prominent at L2-3. Multifocal sclerotic osseous metastases, including the posterior L1 vertebral body (series 5/ image 65), the right iliac bone (series 5/image 91), and the left superior acetabulum (series 5/ image 104) common new. IMPRESSION: Multifocal patchy opacities in the bilateral upper lobes, possibly reflecting mild interstitial edema, less likely multifocal infection. Moderate bilateral pleural effusions. Innumerable hepatic metastases, difficult to discretely measure, but  increased in number from the prior. Moderate abdominopelvic ascites. Progression of peritoneal disease/ omental caking, as described above. Multifocal sclerotic osseous metastases in the thoracolumbar spine, ribs, and pelvis, as above, new. Electronically Signed   By: Julian Hy M.D.   On: 09/19/2016 16:00   Ct Abdomen Pelvis W Contrast  Result Date: 09/19/2016 CLINICAL DATA:  Metastatic breast cancer EXAM: CT CHEST, ABDOMEN, AND PELVIS WITH CONTRAST TECHNIQUE: Multidetector CT imaging of the chest, abdomen and pelvis was performed following the standard protocol during bolus administration of intravenous contrast. CONTRAST:  158mL ISOVUE-300 IOPAMIDOL (ISOVUE-300) INJECTION 61% COMPARISON:  CT chest, abdomen, and pelvis dated 02/02/2016. PET-CT dated 10/26/2015. FINDINGS: CT CHEST FINDINGS Cardiovascular: Heart is normal in size.  No pericardial effusion. No evidence of thoracic aortic aneurysm. Right chest port terminates at in the upper right atrium. Mediastinum/Nodes: No suspicious mediastinal or axillary lymphadenopathy. Lungs/Pleura: Evaluation lung parenchyma is constrained by respiratory motion. Multifocal patchy opacities in the bilateral upper lobes (series 12/ image 36), possibly reflecting mild interstitial edema, less likely multifocal infection. Moderate bilateral pleural effusions. Associated lower lobe opacities, likely atelectasis. No pneumothorax. Musculoskeletal: Right breast prosthesis. Degenerative changes of the visualized thoracolumbar spine. Right lateral 4th, 5th, and 7th through 9th rib fracture deformities. Sclerotic osseous metastases, including the left T5 vertebral body (series 5/ image 21), left posterior 6th rib (series 5/ image 27), and right posterior 8th rib (series 5/ image 37), new. CT ABDOMEN PELVIS FINDINGS Hepatobiliary: Innumerable/diffuse hepatic metastases throughout both lobes. Dominant/ discrete masses are difficult to measure on the current CT, partially due  to motion degradation. However, the overall number has significantly increased. Status post cholecystectomy. No intrahepatic or extrahepatic duct dilatation. Pancreas: Within normal limits. Spleen: Within  normal limits. Adrenals/Urinary Tract: Adrenal glands are within normal limits. Kidneys are within normal limits. 8 mm proximal left ureteral calculus (series 5/image 71). No hydronephrosis. Bladder is decompressed by indwelling Foley catheter with associated intraluminal gas. Stomach/Bowel: Postsurgical changes related to partial gastrectomy with gastrojejunostomy. Additional small bowel suture line in the right mid abdomen (series 5/image 66). No evidence of bowel obstruction. Appendix is not discretely visualized. Small right mid abdominal ventral hernia containing a loop of nondilated small bowel (series 5/image 67). Vascular/Lymphatic: No evidence of abdominal aortic aneurysm. No suspicious abdominopelvic lymphadenopathy. Reproductive: Status post hysterectomy. Bilateral ovaries are within normal limits. Other: Moderate abdominopelvic ascites. Associated peritoneal disease/ omental caking in the left upper abdomen (series 5/ image 49), new. Additional peritoneal disease/ omental caking in the right mid abdomen, measuring up to 5.9 x 1.8 cm (series 5/ image 62), previously 2.1 cm. Musculoskeletal: Degenerative changes of the lumbar spine, most prominent at L2-3. Multifocal sclerotic osseous metastases, including the posterior L1 vertebral body (series 5/ image 65), the right iliac bone (series 5/image 91), and the left superior acetabulum (series 5/ image 104) common new. IMPRESSION: Multifocal patchy opacities in the bilateral upper lobes, possibly reflecting mild interstitial edema, less likely multifocal infection. Moderate bilateral pleural effusions. Innumerable hepatic metastases, difficult to discretely measure, but increased in number from the prior. Moderate abdominopelvic ascites. Progression of  peritoneal disease/ omental caking, as described above. Multifocal sclerotic osseous metastases in the thoracolumbar spine, ribs, and pelvis, as above, new. Electronically Signed   By: Julian Hy M.D.   On: 09/19/2016 16:00   Dg C-arm 1-60 Min-no Report  Result Date: 09/18/2016 Fluoroscopy was utilized by the requesting physician.  No radiographic interpretation.     Medical Consultants:    None.  Anti-Infectives:   meropenem  Subjective:    Kerri Vasquez in no acute distress.  Objective:    Vitals:   09/19/16 1447 09/19/16 2111 09/20/16 0537 09/20/16 0609  BP: 110/76 111/74  101/66  Pulse: 80 96  (!) 103  Resp: 16 16  11   Temp: 98.4 F (36.9 C) 98.4 F (36.9 C)  98 F (36.7 C)  TempSrc: Axillary Oral  Oral  SpO2: 99% 99%  98%  Weight:   (!) 138.4 kg (305 lb 1.9 oz)   Height:        Intake/Output Summary (Last 24 hours) at 09/20/16 0856 Last data filed at 09/20/16 0200  Gross per 24 hour  Intake             1010 ml  Output                0 ml  Net             1010 ml   Filed Weights   09/18/16 0338 09/19/16 0621 09/20/16 0537  Weight: 123.8 kg (272 lb 14.9 oz) 135.5 kg (298 lb 11.6 oz) (!) 138.4 kg (305 lb 1.9 oz)    Exam: General exam: In no acute distress.Morbidly obese Respiratory system: Good air movement clear to auscultation. Cardiovascular system: Regular rate and rhythm with positive S1 and S2 Gastrointestinal system: Soft positive bowel sounds Extremities: 2+ edema Skin: No rashes, lesions or ulcers    Data Reviewed:    Labs: Basic Metabolic Panel:  Recent Labs Lab 09/14/16 1952 09/15/16 0337 09/16/16 0517 09/17/16 0405 09/18/16 0449  NA 139 138 141 137  --   K 4.4 4.7 3.5 3.2*  --   CL 109 110 113* 110  --  CO2 17* 14* 17* 15*  --   GLUCOSE 72 73 85 107*  --   BUN 66* 58* 50* 45*  --   CREATININE 1.70* 1.51* 1.31* 1.38* 1.19*  CALCIUM 8.8* 8.4* 8.1* 8.0*  --    GFR Estimated Creatinine Clearance: 70.3  mL/min (A) (by C-G formula based on SCr of 1.19 mg/dL (H)). Liver Function Tests:  Recent Labs Lab 09/19/16 0800  AST 93*  ALT 29  ALKPHOS 403*  BILITOT 1.1  PROT 5.5*  ALBUMIN 2.1*   No results for input(s): LIPASE, AMYLASE in the last 168 hours.  Recent Labs Lab 09/19/16 0800  AMMONIA 105*   Coagulation profile  Recent Labs Lab 09/15/16 0030  INR 1.22    CBC:  Recent Labs Lab 09/14/16 1952 09/15/16 0337 09/16/16 0517 09/17/16 0405  WBC 13.3* 10.1 8.6 9.5  NEUTROABS 12.2*  --   --   --   HGB 14.0 14.2 11.0* 11.1*  HCT 41.5 42.5 33.8* 33.5*  MCV 98.1 98.8 99.4 99.1  PLT 216 184 206 199   Cardiac Enzymes:  Recent Labs Lab 09/15/16 0030 09/15/16 1004 09/15/16 1842 09/16/16 0006  TROPONINI 0.04* 0.05* 0.03* 0.03*   BNP (last 3 results) No results for input(s): PROBNP in the last 8760 hours. CBG:  Recent Labs Lab 09/18/16 1329  GLUCAP 100*   D-Dimer: No results for input(s): DDIMER in the last 72 hours. Hgb A1c: No results for input(s): HGBA1C in the last 72 hours. Lipid Profile: No results for input(s): CHOL, HDL, LDLCALC, TRIG, CHOLHDL, LDLDIRECT in the last 72 hours. Thyroid function studies: No results for input(s): TSH, T4TOTAL, T3FREE, THYROIDAB in the last 72 hours.  Invalid input(s): FREET3 Anemia work up: No results for input(s): VITAMINB12, FOLATE, FERRITIN, TIBC, IRON, RETICCTPCT in the last 72 hours. Sepsis Labs:  Recent Labs Lab 09/14/16 1952 09/14/16 2000 09/15/16 0337 09/15/16 1004 09/16/16 0517 09/17/16 0405 09/19/16 0429  PROCALCITON  --   --   --  5.09  --  1.10 0.80  WBC 13.3*  --  10.1  --  8.6 9.5  --   LATICACIDVEN  --  2.82*  --  1.6  --   --   --    Microbiology Recent Results (from the past 240 hour(s))  Culture, blood (routine x 2)     Status: Abnormal   Collection Time: 09/14/16  8:11 PM  Result Value Ref Range Status   Specimen Description BLOOD LEFT ANTECUBITAL  Final   Special Requests   Final     BOTTLES DRAWN AEROBIC AND ANAEROBIC Blood Culture adequate volume   Culture  Setup Time   Final    GRAM NEGATIVE RODS AEROBIC BOTTLE ONLY D. ZEIGLER, [place] ([place]) AT 6962 ON 09/15/16 BY C. JESSUP, MLT. GRAM POSITIVE COCCI IN CLUSTERS ANAEROBIC BOTTLE ONLY CRITICAL RESULT CALLED TO, READ BACK BY AND VERIFIED WITH: N GLOGOVAC,PHARMD AT 1951 09/16/16 BY L BENFIELD    Culture (A)  Final    ACINETOBACTER SPECIES STAPHYLOCOCCUS SPECIES (COAGULASE NEGATIVE) THE SIGNIFICANCE OF ISOLATING THIS ORGANISM FROM A SINGLE SET OF BLOOD CULTURES WHEN MULTIPLE SETS ARE DRAWN IS UNCERTAIN. PLEASE NOTIFY THE MICROBIOLOGY DEPARTMENT WITHIN ONE WEEK IF SPECIATION AND SENSITIVITIES ARE REQUIRED. Performed at [facility], [place] [address] [place], [place] [address]    Report Status 09/17/2016 FINAL  Final   Organism ID, Bacteria ACINETOBACTER SPECIES  Final      Susceptibility   Acinetobacter species - MIC*    CEFTAZIDIME <=1  SENSITIVE Sensitive     CEFTRIAXONE 8 SENSITIVE Sensitive     CIPROFLOXACIN <=0.25 SENSITIVE Sensitive     GENTAMICIN <=1 SENSITIVE Sensitive     IMIPENEM <=0.25 SENSITIVE Sensitive     PIP/TAZO >=128 RESISTANT Resistant     TRIMETH/SULFA <=20 SENSITIVE Sensitive     CEFEPIME <=1 SENSITIVE Sensitive     AMPICILLIN/SULBACTAM <=2 SENSITIVE Sensitive     * ACINETOBACTER SPECIES  Urine culture     Status: Abnormal   Collection Time: 09/14/16  8:11 PM  Result Value Ref Range Status   Specimen Description URINE, RANDOM  Final   Special Requests NONE  Final   Culture (A)  Final    >=100,000 COLONIES/mL ESCHERICHIA COLI Confirmed Extended Spectrum Beta-Lactamase Producer (ESBL) >=100,000 COLONIES/mL KLEBSIELLA OXYTOCA    Report Status 09/18/2016 FINAL  Final   Organism ID, Bacteria ESCHERICHIA COLI (A)  Final   Organism ID, Bacteria KLEBSIELLA OXYTOCA (A)  Final      Susceptibility   Escherichia coli - MIC*    AMPICILLIN >=32 RESISTANT Resistant     CEFAZOLIN >=64 RESISTANT  Resistant     CEFTRIAXONE >=64 RESISTANT Resistant     CIPROFLOXACIN >=4 RESISTANT Resistant     GENTAMICIN >=16 RESISTANT Resistant     IMIPENEM <=0.25 SENSITIVE Sensitive     NITROFURANTOIN <=16 SENSITIVE Sensitive     TRIMETH/SULFA >=320 RESISTANT Resistant     AMPICILLIN/SULBACTAM >=32 RESISTANT Resistant     PIP/TAZO 8 SENSITIVE Sensitive     Extended ESBL POSITIVE Resistant     * >=100,000 COLONIES/mL ESCHERICHIA COLI   Klebsiella oxytoca - MIC*    AMPICILLIN >=32 RESISTANT Resistant     CEFAZOLIN >=64 RESISTANT Resistant     CEFTRIAXONE <=1 SENSITIVE Sensitive     CIPROFLOXACIN <=0.25 SENSITIVE Sensitive     GENTAMICIN <=1 SENSITIVE Sensitive     IMIPENEM <=0.25 SENSITIVE Sensitive     NITROFURANTOIN <=16 SENSITIVE Sensitive     TRIMETH/SULFA <=20 SENSITIVE Sensitive     AMPICILLIN/SULBACTAM 16 INTERMEDIATE Intermediate     PIP/TAZO <=4 SENSITIVE Sensitive     Extended ESBL NEGATIVE Sensitive     * >=100,000 COLONIES/mL KLEBSIELLA OXYTOCA  Blood Culture ID Panel (Reflexed)     Status: None   Collection Time: 09/14/16  8:11 PM  Result Value Ref Range Status   Enterococcus species NOT DETECTED NOT DETECTED Final   Listeria monocytogenes NOT DETECTED NOT DETECTED Final   Staphylococcus species NOT DETECTED NOT DETECTED Final   Staphylococcus aureus NOT DETECTED NOT DETECTED Final   Streptococcus species NOT DETECTED NOT DETECTED Final   Streptococcus agalactiae NOT DETECTED NOT DETECTED Final   Streptococcus pneumoniae NOT DETECTED NOT DETECTED Final   Streptococcus pyogenes NOT DETECTED NOT DETECTED Final   Acinetobacter baumannii NOT DETECTED NOT DETECTED Final   Enterobacteriaceae species NOT DETECTED NOT DETECTED Final   Enterobacter cloacae complex NOT DETECTED NOT DETECTED Final   Escherichia coli NOT DETECTED NOT DETECTED Final   Klebsiella oxytoca NOT DETECTED NOT DETECTED Final   Klebsiella pneumoniae NOT DETECTED NOT DETECTED Final   Proteus species NOT DETECTED  NOT DETECTED Final   Serratia marcescens NOT DETECTED NOT DETECTED Final   Haemophilus influenzae NOT DETECTED NOT DETECTED Final   Neisseria meningitidis NOT DETECTED NOT DETECTED Final   Pseudomonas aeruginosa NOT DETECTED NOT DETECTED Final   Candida albicans NOT DETECTED NOT DETECTED Final   Candida glabrata NOT DETECTED NOT DETECTED Final   Candida krusei NOT DETECTED NOT DETECTED Final  Candida parapsilosis NOT DETECTED NOT DETECTED Final   Candida tropicalis NOT DETECTED NOT DETECTED Final    Comment: Performed at [facility], [place] [address], [place], [place] [address]  Blood Culture ID Panel (Reflexed)     Status: Abnormal   Collection Time: 09/14/16  8:11 PM  Result Value Ref Range Status   Enterococcus species NOT DETECTED NOT DETECTED Final   Listeria monocytogenes NOT DETECTED NOT DETECTED Final   Staphylococcus species DETECTED (A) NOT DETECTED Final    Comment: Methicillin (oxacillin) susceptible coagulase negative staphylococcus. Possible blood culture contaminant (unless isolated from more than one blood culture draw or clinical case suggests pathogenicity). No antibiotic treatment is indicated for blood  culture contaminants. CRITICAL RESULT CALLED TO, READ BACK BY AND VERIFIED WITH: N GLOGOVAC,PHARMD AT 1951 09/16/16 BY L BENFIELD    Staphylococcus aureus NOT DETECTED NOT DETECTED Final   Methicillin resistance NOT DETECTED NOT DETECTED Final   Streptococcus species NOT DETECTED NOT DETECTED Final   Streptococcus agalactiae NOT DETECTED NOT DETECTED Final   Streptococcus pneumoniae NOT DETECTED NOT DETECTED Final   Streptococcus pyogenes NOT DETECTED NOT DETECTED Final   Acinetobacter baumannii NOT DETECTED NOT DETECTED Final   Enterobacteriaceae species NOT DETECTED NOT DETECTED Final   Enterobacter cloacae complex NOT DETECTED NOT DETECTED Final   Escherichia coli NOT DETECTED NOT DETECTED Final   Klebsiella oxytoca NOT DETECTED NOT DETECTED Final    Klebsiella pneumoniae NOT DETECTED NOT DETECTED Final   Proteus species NOT DETECTED NOT DETECTED Final   Serratia marcescens NOT DETECTED NOT DETECTED Final   Haemophilus influenzae NOT DETECTED NOT DETECTED Final   Neisseria meningitidis NOT DETECTED NOT DETECTED Final   Pseudomonas aeruginosa NOT DETECTED NOT DETECTED Final   Candida albicans NOT DETECTED NOT DETECTED Final   Candida glabrata NOT DETECTED NOT DETECTED Final   Candida krusei NOT DETECTED NOT DETECTED Final   Candida parapsilosis NOT DETECTED NOT DETECTED Final   Candida tropicalis NOT DETECTED NOT DETECTED Final    Comment: Performed at [facility], [address], [place], [place] [address]  Culture, blood (routine x 2)     Status: None (Preliminary result)   Collection Time: 09/15/16 12:30 AM  Result Value Ref Range Status   Specimen Description BLOOD RIGHT ARM  Final   Special Requests   Final    BOTTLES DRAWN AEROBIC AND ANAEROBIC Blood Culture adequate volume   Culture   Final    NO GROWTH 4 DAYS Performed at [facility], [place] [address], [place],  [address]    Report Status PENDING  Incomplete  MRSA PCR Screening     Status: None   Collection Time: 09/15/16  2:55 AM  Result Value Ref Range Status   MRSA by PCR NEGATIVE NEGATIVE Final    Comment:        The GeneXpert MRSA Assay (FDA approved for NASAL specimens only), is one component of a comprehensive MRSA colonization surveillance program. It is not intended to diagnose MRSA infection nor to guide or monitor treatment for MRSA infections.   Surgical PCR screen     Status: None   Collection Time: 09/18/16 12:03 AM  Result Value Ref Range Status   MRSA, PCR NEGATIVE NEGATIVE Final   Staphylococcus aureus NEGATIVE NEGATIVE Final    Comment:        The Xpert SA Assay (FDA approved for NASAL specimens in patients over 15 years of age), is one component of a comprehensive  surveillance program.  Test performance has been  validated by [facility] for patients greater than or equal to 43 year old. It is not intended to diagnose infection nor to guide or monitor treatment.      Medications:   . Chlorhexidine Gluconate Cloth  6 each Topical Daily  . feeding supplement (ENSURE ENLIVE)  237 mL Oral BID BM  . feeding supplement (PRO-STAT SUGAR FREE 64)  30 mL Oral BID WC  . fentaNYL  100 mcg Transdermal Q48H  . fludrocortisone  100 mcg Oral BID WC  . FLUoxetine  40 mg Oral QAC breakfast  . levothyroxine  300 mcg Oral QAC breakfast  . lipase/protease/amylase  24,000 Units Oral TID AC  . multivitamin with minerals  1 tablet Oral Daily  . pantoprazole  40 mg Oral BID  . pregabalin  200 mg Oral TID  . sodium chloride flush  10-40 mL Intracatheter Q12H   Continuous Infusions: . meropenem (MERREM) IV Stopped (09/20/16 0230)    Time spent: 25 min   LOS: 6 days   Charlynne Cousins  [facility] Pager [phone]  *Please refer to [email or web address], password [email or web address] to get updated schedule on who will round on this patient, as hospitalists switch teams weekly. If 7PM-7AM, please contact night-coverage at [email or web address], password [email or web address] for any overnight needs.  09/20/2016, 8:56 AM

## 2016-09-21 DIAGNOSIS — N390 Urinary tract infection, site not specified: Secondary | ICD-10-CM

## 2016-09-21 DIAGNOSIS — S82302A Unspecified fracture of lower end of left tibia, initial encounter for closed fracture: Secondary | ICD-10-CM

## 2016-09-21 NOTE — Progress Notes (Signed)
CSW following for disposition/ DC planning.  Spoke with daughter, informed her her SNF choice was unable to offer bed ([facility]).   Daughter states she preferred to take pt home if unable to go to [facility] but is awaiting discussion with MD re: pt's prognosis prior to making decision. States she will speak with CSW following MD.  CSW will follow and assist.   Sharren Bridge, MSW, LCSW Clinical Social Work 09/21/2016 [phone]

## 2016-09-21 NOTE — Progress Notes (Signed)
CSW spoke with pt's daughter [name] [phone], discussed DC plan options- Daughter states she is not wanting SNF at this time (either with rehab or with hospice/ palliative) and plans for pt to return home with her at DC. Open to home health services.   Plan: Home with daughter.   Sharren Bridge, MSW, LCSW Clinical Social Work 09/21/2016 [phone]

## 2016-09-21 NOTE — Progress Notes (Signed)
Spoke with [facility] concerning [facility] needs. [facility] selected [facility] and Marlowe Sax, RN. Will need face to face and HHRN/PT/NA for home.

## 2016-09-21 NOTE — Progress Notes (Signed)
Nutrition Brief Follow-up  RD initially assessed patient on 5/20. Pt has been ordered multiple supplements and at that time was eating 100% of protein foods on meal trays. No PO intake has been documented since. Pt unable to provide history.  Noted palliative care involved. GOC to be determined. Will monitor accordingly.   If other nutrition issues arise, please consult RD.   Clayton Bibles, MS, RD, LDN Pager: [phone] After Hours Pager: [phone]

## 2016-09-21 NOTE — Progress Notes (Addendum)
Palliative Care  [name] remains unable to meaningfully participate in a goals of care conversations as she is not decisional due to encephalopathy. No family at bedside. I had spoken with the patient's daughter, [name], yesterday and scheduled a meeting with her today at 0800 at her mother's bedside. Unfortunately, she did not present for this meeting. I called both her home and mobile numbers and left my contact information again, with the request to re-schedule the meeting or talk over the phone. I have also provided my contact information to the care nurse in the event [name] presents at bedside.   Plan Continue to try to contact pt's daughter to facilitate [place] meeting  ADDENDUM: [name] did call me back and asked to reschedule our meeting for 0800 tomorrow (5/25).  2nd ADDENDUM: I received a courtesy call from CM reporting that the patient will likely be discharged today. Unfortunately, [name] was not able to be here today and we were not able to discuss goals of care yet. I'm strongly concerned about discharging this woman to SNF, given her significant disease burden and strong likelihood of acute deterioration. She remains encephalopathic and I believe she will shortly return to the hospital without a better support plan in place. If she does leave today, I would recommend Palliative Care ordered for SNF. If she remains here, I will still plan to meet with her daughter tomorrow morning.   [name] AGNP-C Palliative Care [phone]  No charge note

## 2016-09-21 NOTE — Progress Notes (Signed)
Spoke with pt's daughter Lelan Pons [phone], concerning discharge plans to SNF. CSW to follow.

## 2016-09-21 NOTE — Progress Notes (Signed)
Spoke with pt's daughter [name] [phone] concerning pt's disposition. [name] states MD has not called her yet. Information read to [facility] for continued hospital stay may not be paid by Medicare based on Medicare policy [facility]). Pt will not understand this information so it was given to pt's daughter.

## 2016-09-21 NOTE — Progress Notes (Signed)
PT Cancellation Note  Patient Details Name: Shronda Boeh MRN: [id number] DOB: [birth year]   Cancelled Treatment:    Reason Eval/Treat Not Completed: Pt not currently able to participate meaningfully with PT. Reviewed Palliative Care progress note. Will hold PT today and will continue to check back. Thanks.    Weston Anna, MPT Pager: [phone]

## 2016-09-21 NOTE — Progress Notes (Signed)
[facility] PROGRESS NOTE    Progress Note  Kerri Vasquez  NID:[id number] DOB: 08-14-1955 DOA: 09/14/2016 PCP: [name], MD     Brief Narrative:   Kerri Vasquez is an 61 y.o. female past medical history of stage IV breast cancer with a history of cardiomyopathy with an EF of 65% which has recovered and grade 1 diastolic heart failure, sacral decubitus ulcer who presents to the ED with increasing left ankle pain, she had been in rehabilitation until 2 weeks ago when she was discharged. She had a follow week prior to admission since then her left ankle has been hurting, she denies any loss of consciousness. In the ED and x-ray revealed left ankle fracture and distal tibia and fibula, rolling the ED she became hypotensive tachycardic and febrile, with an elevated lactic leukocytosis UA was compatible for UTI.  Assessment/Plan:   Sepsis ([place]) Due to UTI: Urine culture grew ESBL Escherichia coli and Klebsiella She completed course of ESBL treatment today. Consulted hospice and palliative care to discuss with daughter end of life. Daughter is very hard to get in contact with meet with hospice care tomorrow morning. She has been trying to avoid the conversation about her mom's prognoses.  Left ankle fracture: Status post repair continue further management per orthopedic. She status post open reduction with internal fixation of trimalleolar ankle fracture. Pain seemed to be controlled.  History of metastatic breast cancer: Last chemotherapy about 2 months ago, oncology was consulted recommended to move towards hospice route. Hematology and oncology, Relates she is a poor candidate for any further chemotherapy. They recommended comfort care.   Hypothyroidism: TSH 5.6, will need a repeat TSH in 6 weeks.  Stage III chronic kidney disease: Creatinine seems to be at baseline.  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  Elevated d-dimer: There is likely due to sepsis.  Stage IV sacral decubitus ulcer: Continue wound care.  History of Takatsubo Cardiomyopathy Minimal elevation in her cardiac biomarkers likely from sepsis they have remained flat with no acute EKG changes. No further workup needed at this time.  Encephalopathy: Now resolved.   DVT prophylaxis: lovenox Family Communication:none Disposition Plan/Barrier to D/C: Hopefully in the morning Code Status:     Code Status Orders        Start     Ordered   09/14/16 2340  Full code  Continuous     09/14/16 2340    Code Status History    Date Active Date Inactive Code Status Order ID Comments User Context   03/14/2016  8:14 PM 03/20/2016  7:30 PM Full Code [id number]  Etta Quill, DO ED   02/02/2016  8:22 PM 02/09/2016  8:51 PM Full Code [id number]  Florencia Reasons, MD Inpatient   04/20/2015  7:34 AM 04/21/2015  8:15 PM Full Code [id number]  Jonetta Osgood, MD Inpatient   04/19/2015  9:23 AM 04/20/2015  7:34 AM DNR [id number]  Radene Gunning, NP Inpatient   08/26/2014  9:33 PM 08/28/2014  5:33 PM Full Code [id number]  Francesca Oman, DO Inpatient   07/14/2014  9:11 AM 07/18/2014  6:42 PM Full Code [id number]  Leonie Man, MD Inpatient   07/11/2014 10:01 PM 07/14/2014  9:11 AM Full Code [id number]  Alric Quan, MD Inpatient   07/10/2014  6:06 PM 07/11/2014 10:01 PM Full Code [id number]  Arne Cleveland, MD Inpatient   02/16/2012  1:26 PM 02/17/2012 12:11 PM Full Code [id number]  Yvonna Alanis, RN Inpatient        IV Access:    Peripheral IV   Procedures and diagnostic studies:   Ct Head W & Wo Contrast  Result Date: 09/19/2016 CLINICAL DATA:  [place] last week. History of metastatic breast cancer, migraine, hypertension, encephalopathy. EXAM: CT HEAD WITHOUT AND WITH CONTRAST TECHNIQUE: Contiguous axial images were obtained from the base of the skull through the vertex without and with intravenous contrast  CONTRAST:  17mL ISOVUE-300 IOPAMIDOL (ISOVUE-300) INJECTION 61% COMPARISON:  CT HEAD Sep 14, 2016 and MRI of the head February 06, 2016 FINDINGS: BRAIN: No intraparenchymal hemorrhage, mass effect nor midline shift. The ventricles and sulci are normal for age. Patchy supratentorial white matter hypodensities. No acute large vascular territory infarcts. No abnormal extra-axial fluid collections. Basal cisterns are patent. No abnormal intracranial enhancement. VASCULAR: Moderate calcific atherosclerosis of the carotid siphons. SKULL: No skull fracture. No significant scalp soft tissue swelling. SINUSES/ORBITS: The mastoid air-cells and included paranasal sinuses are well-aerated.The included ocular globes and orbital contents are non-suspicious. OTHER: None. IMPRESSION: No acute intracranial process or CT findings of metastatic disease. Mild to moderate white matter changes compatible chronic small vessel ischemic disease and, may be related to prior chemotherapy. Electronically Signed   By: Elon Alas M.D.   On: 09/19/2016 15:09   Ct Chest W Contrast  Result Date: 09/19/2016 CLINICAL DATA:  Metastatic breast cancer EXAM: CT CHEST, ABDOMEN, AND PELVIS WITH CONTRAST TECHNIQUE: Multidetector CT imaging of the chest, abdomen and pelvis was performed following the standard protocol during bolus administration of intravenous contrast. CONTRAST:  174mL ISOVUE-300 IOPAMIDOL (ISOVUE-300) INJECTION 61% COMPARISON:  CT chest, abdomen, and pelvis dated 02/02/2016. PET-CT dated 10/26/2015. FINDINGS: CT CHEST FINDINGS Cardiovascular: Heart is normal in size.  No pericardial effusion. No evidence of thoracic aortic aneurysm. Right chest port terminates at in the upper right atrium. Mediastinum/Nodes: No suspicious mediastinal or axillary lymphadenopathy. Lungs/Pleura: Evaluation lung parenchyma is constrained by respiratory motion. Multifocal patchy opacities in the bilateral upper lobes (series 12/ image 36), possibly  reflecting mild interstitial edema, less likely multifocal infection. Moderate bilateral pleural effusions. Associated lower lobe opacities, likely atelectasis. No pneumothorax. Musculoskeletal: Right breast prosthesis. Degenerative changes of the visualized thoracolumbar spine. Right lateral 4th, 5th, and 7th through 9th rib fracture deformities. Sclerotic osseous metastases, including the left T5 vertebral body (series 5/ image 21), left posterior 6th rib (series 5/ image 27), and right posterior 8th rib (series 5/ image 37), new. CT ABDOMEN PELVIS FINDINGS Hepatobiliary: Innumerable/diffuse hepatic metastases throughout both lobes. Dominant/ discrete masses are difficult to measure on the current CT, partially due to motion degradation. However, the overall number has significantly increased. Status post cholecystectomy. No intrahepatic or extrahepatic duct dilatation. Pancreas: Within normal limits. Spleen: Within normal limits. Adrenals/Urinary Tract: Adrenal glands are within normal limits. Kidneys are within normal limits. 8 mm proximal left ureteral calculus (series 5/image 71). No hydronephrosis. Bladder is decompressed by indwelling Foley catheter with associated intraluminal gas. Stomach/Bowel: Postsurgical changes related to partial gastrectomy with gastrojejunostomy. Additional small bowel suture line in the right mid abdomen (series 5/image 66). No evidence of bowel obstruction. Appendix is not discretely visualized. Small right mid abdominal ventral hernia containing a loop of nondilated small bowel (series 5/image 67). Vascular/Lymphatic: No evidence of abdominal aortic aneurysm. No suspicious abdominopelvic lymphadenopathy. Reproductive: Status post hysterectomy. Bilateral ovaries are within normal limits. Other: Moderate abdominopelvic ascites. Associated peritoneal disease/ omental caking in the left upper abdomen (series 5/ image 49), new. Additional  peritoneal disease/ omental caking in the  right mid abdomen, measuring up to 5.9 x 1.8 cm (series 5/ image 62), previously 2.1 cm. Musculoskeletal: Degenerative changes of the lumbar spine, most prominent at L2-3. Multifocal sclerotic osseous metastases, including the posterior L1 vertebral body (series 5/ image 65), the right iliac bone (series 5/image 91), and the left superior acetabulum (series 5/ image 104) common new. IMPRESSION: Multifocal patchy opacities in the bilateral upper lobes, possibly reflecting mild interstitial edema, less likely multifocal infection. Moderate bilateral pleural effusions. Innumerable hepatic metastases, difficult to discretely measure, but increased in number from the prior. Moderate abdominopelvic ascites. Progression of peritoneal disease/ omental caking, as described above. Multifocal sclerotic osseous metastases in the thoracolumbar spine, ribs, and pelvis, as above, new. Electronically Signed   By: Julian Hy M.D.   On: 09/19/2016 16:00   Ct Abdomen Pelvis W Contrast  Result Date: 09/19/2016 CLINICAL DATA:  Metastatic breast cancer EXAM: CT CHEST, ABDOMEN, AND PELVIS WITH CONTRAST TECHNIQUE: Multidetector CT imaging of the chest, abdomen and pelvis was performed following the standard protocol during bolus administration of intravenous contrast. CONTRAST:  114mL ISOVUE-300 IOPAMIDOL (ISOVUE-300) INJECTION 61% COMPARISON:  CT chest, abdomen, and pelvis dated 02/02/2016. PET-CT dated 10/26/2015. FINDINGS: CT CHEST FINDINGS Cardiovascular: Heart is normal in size.  No pericardial effusion. No evidence of thoracic aortic aneurysm. Right chest port terminates at in the upper right atrium. Mediastinum/Nodes: No suspicious mediastinal or axillary lymphadenopathy. Lungs/Pleura: Evaluation lung parenchyma is constrained by respiratory motion. Multifocal patchy opacities in the bilateral upper lobes (series 12/ image 36), possibly reflecting mild interstitial edema, less likely multifocal infection. Moderate  bilateral pleural effusions. Associated lower lobe opacities, likely atelectasis. No pneumothorax. Musculoskeletal: Right breast prosthesis. Degenerative changes of the visualized thoracolumbar spine. Right lateral 4th, 5th, and 7th through 9th rib fracture deformities. Sclerotic osseous metastases, including the left T5 vertebral body (series 5/ image 21), left posterior 6th rib (series 5/ image 27), and right posterior 8th rib (series 5/ image 37), new. CT ABDOMEN PELVIS FINDINGS Hepatobiliary: Innumerable/diffuse hepatic metastases throughout both lobes. Dominant/ discrete masses are difficult to measure on the current CT, partially due to motion degradation. However, the overall number has significantly increased. Status post cholecystectomy. No intrahepatic or extrahepatic duct dilatation. Pancreas: Within normal limits. Spleen: Within normal limits. Adrenals/Urinary Tract: Adrenal glands are within normal limits. Kidneys are within normal limits. 8 mm proximal left ureteral calculus (series 5/image 71). No hydronephrosis. Bladder is decompressed by indwelling Foley catheter with associated intraluminal gas. Stomach/Bowel: Postsurgical changes related to partial gastrectomy with gastrojejunostomy. Additional small bowel suture line in the right mid abdomen (series 5/image 66). No evidence of bowel obstruction. Appendix is not discretely visualized. Small right mid abdominal ventral hernia containing a loop of nondilated small bowel (series 5/image 67). Vascular/Lymphatic: No evidence of abdominal aortic aneurysm. No suspicious abdominopelvic lymphadenopathy. Reproductive: Status post hysterectomy. Bilateral ovaries are within normal limits. Other: Moderate abdominopelvic ascites. Associated peritoneal disease/ omental caking in the left upper abdomen (series 5/ image 49), new. Additional peritoneal disease/ omental caking in the right mid abdomen, measuring up to 5.9 x 1.8 cm (series 5/ image 62), previously  2.1 cm. Musculoskeletal: Degenerative changes of the lumbar spine, most prominent at L2-3. Multifocal sclerotic osseous metastases, including the posterior L1 vertebral body (series 5/ image 65), the right iliac bone (series 5/image 91), and the left superior acetabulum (series 5/ image 104) common new. IMPRESSION: Multifocal patchy opacities in the bilateral upper lobes, possibly reflecting mild interstitial edema, less  likely multifocal infection. Moderate bilateral pleural effusions. Innumerable hepatic metastases, difficult to discretely measure, but increased in number from the prior. Moderate abdominopelvic ascites. Progression of peritoneal disease/ omental caking, as described above. Multifocal sclerotic osseous metastases in the thoracolumbar spine, ribs, and pelvis, as above, new. Electronically Signed   By: Julian Hy M.D.   On: 09/19/2016 16:00     Medical Consultants:    None.  Anti-Infectives:   meropenem  Subjective:    Audrea Muscat Vasquez in no acute distress.  Objective:    Vitals:   09/20/16 1500 09/20/16 2057 09/21/16 0500 09/21/16 0605  BP: 134/71 117/76  (!) 100/54  Pulse: 89 (!) 101  90  Resp:  13  14  Temp:  98 F (36.7 C)  98.8 F (37.1 C)  TempSrc:  Oral  Oral  SpO2: 100% 98%  100%  Weight:   (!) 137.3 kg (302 lb 11.1 oz)   Height:        Intake/Output Summary (Last 24 hours) at 09/21/16 0804 Last data filed at 09/21/16 2595  Gross per 24 hour  Intake              200 ml  Output              500 ml  Net             -300 ml   Filed Weights   09/19/16 0621 09/20/16 0537 09/21/16 0500  Weight: 135.5 kg (298 lb 11.6 oz) (!) 138.4 kg (305 lb 1.9 oz) (!) 137.3 kg (302 lb 11.1 oz)    Exam: General exam: In no acute distress.Morbidly obese, Lying comfortably in bed Respiratory system: Good air movement and clear to auscultation. Cardiovascular system: Regular rate and rhythm with positive S1-S2 Gastrointestinal system: Soft positive  bowel sounds Extremities: 2+ edema Skin: No rashes, lesions or ulcers    Data Reviewed:    Labs: Basic Metabolic Panel:  Recent Labs Lab 09/14/16 1952 09/15/16 0337 09/16/16 0517 09/17/16 0405 09/18/16 0449  NA 139 138 141 137  --   K 4.4 4.7 3.5 3.2*  --   CL 109 110 113* 110  --   CO2 17* 14* 17* 15*  --   GLUCOSE 72 73 85 107*  --   BUN 66* 58* 50* 45*  --   CREATININE 1.70* 1.51* 1.31* 1.38* 1.19*  CALCIUM 8.8* 8.4* 8.1* 8.0*  --    GFR Estimated Creatinine Clearance: 70 mL/min (A) (by C-G formula based on SCr of 1.19 mg/dL (H)). Liver Function Tests:  Recent Labs Lab 09/19/16 0800  AST 93*  ALT 29  ALKPHOS 403*  BILITOT 1.1  PROT 5.5*  ALBUMIN 2.1*   No results for input(s): LIPASE, AMYLASE in the last 168 hours.  Recent Labs Lab 09/19/16 0800 09/20/16 0825  AMMONIA 105* 95*   Coagulation profile  Recent Labs Lab 09/15/16 0030  INR 1.22    CBC:  Recent Labs Lab 09/14/16 1952 09/15/16 0337 09/16/16 0517 09/17/16 0405  WBC 13.3* 10.1 8.6 9.5  NEUTROABS 12.2*  --   --   --   HGB 14.0 14.2 11.0* 11.1*  HCT 41.5 42.5 33.8* 33.5*  MCV 98.1 98.8 99.4 99.1  PLT 216 184 206 199   Cardiac Enzymes:  Recent Labs Lab 09/15/16 0030 09/15/16 1004 09/15/16 1842 09/16/16 0006  TROPONINI 0.04* 0.05* 0.03* 0.03*   BNP (last 3 results) No results for input(s): PROBNP in the last 8760 hours. CBG:  Recent  Labs Lab 09/18/16 1329  GLUCAP 100*   D-Dimer: No results for input(s): DDIMER in the last 72 hours. Hgb A1c: No results for input(s): HGBA1C in the last 72 hours. Lipid Profile: No results for input(s): CHOL, HDL, LDLCALC, TRIG, CHOLHDL, LDLDIRECT in the last 72 hours. Thyroid function studies: No results for input(s): TSH, T4TOTAL, T3FREE, THYROIDAB in the last 72 hours.  Invalid input(s): FREET3 Anemia work up: No results for input(s): VITAMINB12, FOLATE, FERRITIN, TIBC, IRON, RETICCTPCT in the last 72 hours. Sepsis  Labs:  Recent Labs Lab 09/14/16 1952 09/14/16 2000 09/15/16 0337 09/15/16 1004 09/16/16 0517 09/17/16 0405 09/19/16 0429  PROCALCITON  --   --   --  5.09  --  1.10 0.80  WBC 13.3*  --  10.1  --  8.6 9.5  --   LATICACIDVEN  --  2.82*  --  1.6  --   --   --    Microbiology Recent Results (from the past 240 hour(s))  Culture, blood (routine x 2)     Status: Abnormal   Collection Time: 09/14/16  8:11 PM  Result Value Ref Range Status   Specimen Description BLOOD LEFT ANTECUBITAL  Final   Special Requests   Final    BOTTLES DRAWN AEROBIC AND ANAEROBIC Blood Culture adequate volume   Culture  Setup Time   Final    GRAM NEGATIVE RODS AEROBIC BOTTLE ONLY D. ZEIGLER, Alva ([facility]) AT 8338 ON 09/15/16 BY C. JESSUP, MLT. GRAM POSITIVE COCCI IN CLUSTERS ANAEROBIC BOTTLE ONLY CRITICAL RESULT CALLED TO, READ BACK BY AND VERIFIED WITH: N GLOGOVAC,PHARMD AT 1951 09/16/16 BY L BENFIELD    Culture (A)  Final    ACINETOBACTER SPECIES STAPHYLOCOCCUS SPECIES (COAGULASE NEGATIVE) THE SIGNIFICANCE OF ISOLATING THIS ORGANISM FROM A SINGLE SET OF BLOOD CULTURES WHEN MULTIPLE SETS ARE DRAWN IS UNCERTAIN. PLEASE NOTIFY THE MICROBIOLOGY DEPARTMENT WITHIN ONE WEEK IF SPECIATION AND SENSITIVITIES ARE REQUIRED. Performed at [facility], [place] [address] [place], [place] [address]    Report Status 09/17/2016 FINAL  Final   Organism ID, Bacteria ACINETOBACTER SPECIES  Final      Susceptibility   Acinetobacter species - MIC*    CEFTAZIDIME <=1 SENSITIVE Sensitive     CEFTRIAXONE 8 SENSITIVE Sensitive     CIPROFLOXACIN <=0.25 SENSITIVE Sensitive     GENTAMICIN <=1 SENSITIVE Sensitive     IMIPENEM <=0.25 SENSITIVE Sensitive     PIP/TAZO >=128 RESISTANT Resistant     TRIMETH/SULFA <=20 SENSITIVE Sensitive     CEFEPIME <=1 SENSITIVE Sensitive     AMPICILLIN/SULBACTAM <=2 SENSITIVE Sensitive     * ACINETOBACTER SPECIES  Urine culture     Status: Abnormal   Collection Time: 09/14/16  8:11 PM  Result  Value Ref Range Status   Specimen Description URINE, RANDOM  Final   Special Requests NONE  Final   Culture (A)  Final    >=100,000 COLONIES/mL ESCHERICHIA COLI Confirmed Extended Spectrum Beta-Lactamase Producer (ESBL) >=100,000 COLONIES/mL KLEBSIELLA OXYTOCA    Report Status 09/18/2016 FINAL  Final   Organism ID, Bacteria ESCHERICHIA COLI (A)  Final   Organism ID, Bacteria KLEBSIELLA OXYTOCA (A)  Final      Susceptibility   Escherichia coli - MIC*    AMPICILLIN >=32 RESISTANT Resistant     CEFAZOLIN >=64 RESISTANT Resistant     CEFTRIAXONE >=64 RESISTANT Resistant     CIPROFLOXACIN >=4 RESISTANT Resistant     GENTAMICIN >=16 RESISTANT Resistant     IMIPENEM <=0.25 SENSITIVE Sensitive  NITROFURANTOIN <=16 SENSITIVE Sensitive     TRIMETH/SULFA >=320 RESISTANT Resistant     AMPICILLIN/SULBACTAM >=32 RESISTANT Resistant     PIP/TAZO 8 SENSITIVE Sensitive     Extended ESBL POSITIVE Resistant     * >=100,000 COLONIES/mL ESCHERICHIA COLI   Klebsiella oxytoca - MIC*    AMPICILLIN >=32 RESISTANT Resistant     CEFAZOLIN >=64 RESISTANT Resistant     CEFTRIAXONE <=1 SENSITIVE Sensitive     CIPROFLOXACIN <=0.25 SENSITIVE Sensitive     GENTAMICIN <=1 SENSITIVE Sensitive     IMIPENEM <=0.25 SENSITIVE Sensitive     NITROFURANTOIN <=16 SENSITIVE Sensitive     TRIMETH/SULFA <=20 SENSITIVE Sensitive     AMPICILLIN/SULBACTAM 16 INTERMEDIATE Intermediate     PIP/TAZO <=4 SENSITIVE Sensitive     Extended ESBL NEGATIVE Sensitive     * >=100,000 COLONIES/mL KLEBSIELLA OXYTOCA  Blood Culture ID Panel (Reflexed)     Status: None   Collection Time: 09/14/16  8:11 PM  Result Value Ref Range Status   Enterococcus species NOT DETECTED NOT DETECTED Final   Listeria monocytogenes NOT DETECTED NOT DETECTED Final   Staphylococcus species NOT DETECTED NOT DETECTED Final   Staphylococcus aureus NOT DETECTED NOT DETECTED Final   Streptococcus species NOT DETECTED NOT DETECTED Final   Streptococcus  agalactiae NOT DETECTED NOT DETECTED Final   Streptococcus pneumoniae NOT DETECTED NOT DETECTED Final   Streptococcus pyogenes NOT DETECTED NOT DETECTED Final   Acinetobacter baumannii NOT DETECTED NOT DETECTED Final   Enterobacteriaceae species NOT DETECTED NOT DETECTED Final   Enterobacter cloacae complex NOT DETECTED NOT DETECTED Final   Escherichia coli NOT DETECTED NOT DETECTED Final   Klebsiella oxytoca NOT DETECTED NOT DETECTED Final   Klebsiella pneumoniae NOT DETECTED NOT DETECTED Final   Proteus species NOT DETECTED NOT DETECTED Final   Serratia marcescens NOT DETECTED NOT DETECTED Final   Haemophilus influenzae NOT DETECTED NOT DETECTED Final   Neisseria meningitidis NOT DETECTED NOT DETECTED Final   Pseudomonas aeruginosa NOT DETECTED NOT DETECTED Final   Candida albicans NOT DETECTED NOT DETECTED Final   Candida glabrata NOT DETECTED NOT DETECTED Final   Candida krusei NOT DETECTED NOT DETECTED Final   Candida parapsilosis NOT DETECTED NOT DETECTED Final   Candida tropicalis NOT DETECTED NOT DETECTED Final    Comment: Performed at [facility], [address] [place], [place] [address]  Blood Culture ID Panel (Reflexed)     Status: Abnormal   Collection Time: 09/14/16  8:11 PM  Result Value Ref Range Status   Enterococcus species NOT DETECTED NOT DETECTED Final   Listeria monocytogenes NOT DETECTED NOT DETECTED Final   Staphylococcus species DETECTED (A) NOT DETECTED Final    Comment: Methicillin (oxacillin) susceptible coagulase negative staphylococcus. Possible blood culture contaminant (unless isolated from more than one blood culture draw or clinical case suggests pathogenicity). No antibiotic treatment is indicated for blood  culture contaminants. CRITICAL RESULT CALLED TO, READ BACK BY AND VERIFIED WITH: N GLOGOVAC,PHARMD AT 1951 09/16/16 BY L BENFIELD    Staphylococcus aureus NOT DETECTED NOT DETECTED Final   Methicillin resistance NOT DETECTED NOT DETECTED  Final   Streptococcus species NOT DETECTED NOT DETECTED Final   Streptococcus agalactiae NOT DETECTED NOT DETECTED Final   Streptococcus pneumoniae NOT DETECTED NOT DETECTED Final   Streptococcus pyogenes NOT DETECTED NOT DETECTED Final   Acinetobacter baumannii NOT DETECTED NOT DETECTED Final   Enterobacteriaceae species NOT DETECTED NOT DETECTED Final   Enterobacter cloacae complex NOT DETECTED NOT DETECTED  Final   Escherichia coli NOT DETECTED NOT DETECTED Final   Klebsiella oxytoca NOT DETECTED NOT DETECTED Final   Klebsiella pneumoniae NOT DETECTED NOT DETECTED Final   Proteus species NOT DETECTED NOT DETECTED Final   Serratia marcescens NOT DETECTED NOT DETECTED Final   Haemophilus influenzae NOT DETECTED NOT DETECTED Final   Neisseria meningitidis NOT DETECTED NOT DETECTED Final   Pseudomonas aeruginosa NOT DETECTED NOT DETECTED Final   Candida albicans NOT DETECTED NOT DETECTED Final   Candida glabrata NOT DETECTED NOT DETECTED Final   Candida krusei NOT DETECTED NOT DETECTED Final   Candida parapsilosis NOT DETECTED NOT DETECTED Final   Candida tropicalis NOT DETECTED NOT DETECTED Final    Comment: Performed at [facility], [place] [address] [place], [place] [address]  Culture, blood (routine x 2)     Status: None   Collection Time: 09/15/16 12:30 AM  Result Value Ref Range Status   Specimen Description BLOOD RIGHT ARM  Final   Special Requests   Final    BOTTLES DRAWN AEROBIC AND ANAEROBIC Blood Culture adequate volume   Culture   Final    NO GROWTH 5 DAYS Performed at [facility], [address], [place], [place] [address]    Report Status 09/20/2016 FINAL  Final  MRSA PCR Screening     Status: None   Collection Time: 09/15/16  2:55 AM  Result Value Ref Range Status   MRSA by PCR NEGATIVE NEGATIVE Final    Comment:        The GeneXpert MRSA Assay (FDA approved for NASAL specimens only), is one component of a comprehensive MRSA colonization surveillance  program. It is not intended to diagnose MRSA infection nor to guide or monitor treatment for MRSA infections.   Surgical PCR screen     Status: None   Collection Time: 09/18/16 12:03 AM  Result Value Ref Range Status   MRSA, PCR NEGATIVE NEGATIVE Final   Staphylococcus aureus NEGATIVE NEGATIVE Final    Comment:        The Xpert SA Assay (FDA approved for NASAL specimens in patients over 44 years of age), is one component of a comprehensive surveillance program.  Test performance has been validated by [facility] for patients greater than or equal to 72 year old. It is not intended to diagnose infection nor to guide or monitor treatment.      Medications:   . Chlorhexidine Gluconate Cloth  6 each Topical Daily  . feeding supplement (ENSURE ENLIVE)  237 mL Oral BID BM  . feeding supplement (PRO-STAT SUGAR FREE 64)  30 mL Oral BID WC  . fentaNYL  100 mcg Transdermal Q48H  . fludrocortisone  100 mcg Oral BID WC  . FLUoxetine  40 mg Oral QAC breakfast  . heparin subcutaneous  5,000 Units Subcutaneous Q8H  . lactulose  20 g Oral BID  . levothyroxine  300 mcg Oral QAC breakfast  . lipase/protease/amylase  24,000 Units Oral TID AC  . multivitamin with minerals  1 tablet Oral Daily  . pantoprazole  40 mg Oral BID  . pregabalin  200 mg Oral TID  . sodium chloride flush  10-40 mL Intracatheter Q12H   Continuous Infusions: . meropenem (MERREM) IV Stopped (09/21/16 0201)    Time spent: 25 min   LOS: 7 days   Charlynne Cousins  [facility] Pager [phone]  *Please refer to [email or web address], password [email or web address] to get updated schedule on who will round on this patient,  as hospitalists switch teams weekly. If 7PM-7AM, please contact night-coverage at [email or web address], password [email or web address] for any overnight needs.  09/21/2016, 8:04 AM

## 2016-09-21 NOTE — Progress Notes (Signed)
Subjective: 3 Days Post-Op Procedure(s) (LRB): OPEN REDUCTION INTERNAL FIXATION (ORIF) LEFT ANKLE FRACTURE (Left) Patient reports pain as pt obtunded and unable to give any history.    Objective: Vital signs in last 24 hours: Temp:  [98 F (36.7 C)-98.8 F (37.1 C)] 98.8 F (37.1 C) (05/24 0605) Pulse Rate:  [89-101] 90 (05/24 0605) Resp:  [13-14] 14 (05/24 0605) BP: (100-134)/(54-76) 100/54 (05/24 0605) SpO2:  [98 %-100 %] 100 % (05/24 0605) Weight:  [137.3 kg (302 lb 11.1 oz)] 137.3 kg (302 lb 11.1 oz) (05/24 0500)  Intake/Output from previous day: 05/23 0701 - 05/24 0700 In: 200 [IV Piggyback:200] Out: 500 [Urine:500] Intake/Output this shift: No intake/output data recorded.  No results for input(s): HGB in the last 72 hours. No results for input(s): WBC, RBC, HCT, PLT in the last 72 hours. No results for input(s): NA, K, CL, CO2, BUN, CREATININE, GLUCOSE, CALCIUM in the last 72 hours. No results for input(s): LABPT, INR in the last 72 hours.  No cellulitis present Compartment soft  Assessment/Plan: 3 Days Post-Op Procedure(s) (LRB): OPEN REDUCTION INTERNAL FIXATION (ORIF) LEFT ANKLE FRACTURE (Left) Advance diet Up with therapy would keep dressing in place for 2 weeks and then have f/u and suture removal  [name] L 09/21/2016, 8:17 AM

## 2016-09-22 ENCOUNTER — Encounter: Payer: Self-pay | Admitting: Hematology & Oncology

## 2016-09-22 ENCOUNTER — Ambulatory Visit: Payer: Self-pay | Admitting: Hematology & Oncology

## 2016-09-22 ENCOUNTER — Other Ambulatory Visit: Payer: Self-pay

## 2016-09-22 DIAGNOSIS — Z7189 Other specified counseling: Secondary | ICD-10-CM

## 2016-09-22 DIAGNOSIS — A409 Streptococcal sepsis, unspecified: Secondary | ICD-10-CM

## 2016-09-22 DIAGNOSIS — Z515 Encounter for palliative care: Secondary | ICD-10-CM

## 2016-09-22 DIAGNOSIS — C50911 Malignant neoplasm of unspecified site of right female breast: Secondary | ICD-10-CM

## 2016-09-22 DIAGNOSIS — C50912 Malignant neoplasm of unspecified site of left female breast: Secondary | ICD-10-CM

## 2016-09-22 DIAGNOSIS — Z959 Presence of cardiac and vascular implant and graft, unspecified: Secondary | ICD-10-CM

## 2016-09-22 LAB — CBC WITH DIFFERENTIAL/PLATELET
Basophils Absolute: 0.1 10*3/uL (ref 0.0–0.1)
Basophils Relative: 1 %
Eosinophils Absolute: 0.3 10*3/uL (ref 0.0–0.7)
Eosinophils Relative: 3 %
HCT: 37.5 % (ref 36.0–46.0)
Hemoglobin: 12.1 g/dL (ref 12.0–15.0)
Lymphocytes Relative: 15 %
Lymphs Abs: 1.3 10*3/uL (ref 0.7–4.0)
MCH: 32.5 pg (ref 26.0–34.0)
MCHC: 32.3 g/dL (ref 30.0–36.0)
MCV: 100.8 fL — ABNORMAL HIGH (ref 78.0–100.0)
Monocytes Absolute: 0.5 10*3/uL (ref 0.1–1.0)
Monocytes Relative: 6 %
Neutro Abs: 6.9 10*3/uL (ref 1.7–7.7)
Neutrophils Relative %: 75 %
Platelets: 127 10*3/uL — ABNORMAL LOW (ref 150–400)
RBC: 3.72 MIL/uL — ABNORMAL LOW (ref 3.87–5.11)
RDW: 18.7 % — ABNORMAL HIGH (ref 11.5–15.5)
WBC: 9.1 10*3/uL (ref 4.0–10.5)

## 2016-09-22 LAB — AMMONIA: Ammonia: 40 umol/L — ABNORMAL HIGH (ref 9–35)

## 2016-09-22 LAB — COMPREHENSIVE METABOLIC PANEL
ALK PHOS: 571 U/L — AB (ref 38–126)
ALT: 27 U/L (ref 14–54)
ANION GAP: 12 (ref 5–15)
AST: 154 U/L — ABNORMAL HIGH (ref 15–41)
Albumin: 2.3 g/dL — ABNORMAL LOW (ref 3.5–5.0)
BILIRUBIN TOTAL: 1.3 mg/dL — AB (ref 0.3–1.2)
BUN: 38 mg/dL — ABNORMAL HIGH (ref 6–20)
CALCIUM: 8.6 mg/dL — AB (ref 8.9–10.3)
CO2: 17 mmol/L — ABNORMAL LOW (ref 22–32)
Chloride: 116 mmol/L — ABNORMAL HIGH (ref 101–111)
Creatinine, Ser: 1.13 mg/dL — ABNORMAL HIGH (ref 0.44–1.00)
GFR calc Af Amer: 59 mL/min — ABNORMAL LOW (ref >60)
GFR calc non Af Amer: 51 mL/min — ABNORMAL LOW (ref >60)
Glucose, Bld: 81 mg/dL (ref 65–99)
Potassium: 4.1 mmol/L (ref 3.5–5.1)
Sodium: 145 mmol/L (ref 135–145)
TOTAL PROTEIN: 6.2 g/dL — AB (ref 6.5–8.1)

## 2016-09-22 MED ORDER — DEXTROSE 5 % IV SOLN
2.0000 g | INTRAVENOUS | Status: DC
  Filled 2016-09-22: qty 2

## 2016-09-22 MED ORDER — HEPARIN SOD (PORK) LOCK FLUSH 100 UNIT/ML IV SOLN
500.0000 [IU] | INTRAVENOUS | Status: AC | PRN
  Administered 2016-09-22: 500 [IU]

## 2016-09-22 MED ORDER — FENTANYL 100 MCG/HR TD PT72: 100.0000 ug | MEDICATED_PATCH | TRANSDERMAL | 0 refills | Status: AC

## 2016-09-22 NOTE — Progress Notes (Signed)
The patient will be NWB on left leg. Unfortunately has significant medical issues with hospice involvement.Will need follow up with Dr Berenice Primas in the office in 2 weeks.( or have sutures removed by nurse at home once 2 1/2 weeks post op)

## 2016-09-22 NOTE — Progress Notes (Signed)
Overall, Kerri Vasquez really is no better.  She has Acinetobacter in her blood. Given that she has a Port-A-Cath in, she needs at least 2-3 weeks of antibiotics. She currently is not on antibiotics. I will start her on daily Rocephin. I am not sure how well she is able to swallow. She still has some encephalopathy. We will see what her ammonia level is.  I will also check her CBC and metabolic panel.  I think the real prognostic factor now is her prealbumin. Her prealbumin is less than 5. This is an incredibly ominous factor for her.  I spoke to her daughter by phone today.  I told her daughter that with a prealbumin of less than 5 and metastatic cancer, I have not seen a patients survive or then 6 weeks. I think this would be very likely for Kerri Vasquez.  Her daughter says that Kerri Vasquez has a living will that wants her to be put on life support and if she is no better in 6 months, that she can come off life support. I told her that this would never happen in any hospital. It is just not rational to keep somebody alive arm machine when they have metastatic cancer in which there is no therapy for. I told her daughter that given the current status of Kerri Vasquez's cancer, her prealbumin being so low, and her overall very poor performance status (ECOG 4) that it is very reasonable and best for Kerri Vasquez that she not continue to suffer and that a DO NOT RESUSCITATE status I think if she would very much agreed to. Unfortunately, Kerri Vasquez is in no condition to really make a determination regarding her end-of-life desires. I think her daughter is much more receptive to a DO NOT RESUSCITATE status.  Her daughter wants to be able to take her home. I told no how this is going to work out. She says that the hospital wants to discharge her today. I'm not sure at all how she would be able to manage at home.  I did talk about hospice with her daughter. Her daughter is willing to get hospice  involved.  Again, I know that the bond between Kerri Vasquez and her daughter is very strong. I know that her daughter has always try to do the best for Kerri Vasquez.  Ultimately, I told her daughter that I just do not think that Kerri Vasquez will survive through June. I feel very confident that this will be accurate.  Hopefully, Kerri Vasquez will be made a DO NOT RESUSCITATE. I told her daughter that I just do not see how there could be any benefit to her mother being put on life support.  I know that this is a very emotional issue for her daughter.  I appreciate all the wonderful care that Kerri Vasquez is getting from the staff up on [address].  Lattie Haw, MD  Proverbs 3:5-6

## 2016-09-22 NOTE — Progress Notes (Signed)
[facility]  Received request from [place] with PMT for family interest in [facility] services at home after discharge. Confirmed with RNCM Cookie plan is for discharge today and that patient will go home via [place]. Chart reviewed and eligibility confirmed by [facility] physician.   Spoke with daughter [name] by phone to initiate hospice education. [name] confirmed desire for hospice and requested wheelchair, shower seat and oxygen setup at home. DME has been ordered through [place]. Daughter [name] is person [facility] will contact to schedule delivery to home. Address in [facility] has been confirmed as discharge address. [facility] RN is scheduled to see patient this afternoon or evening after discharge from hospital.   Please send prescriptions for any medication patient does not already have including comfort medications.  Discharge summary has been routed to [facility] referreal center.  Please notify [facility] when patient leaves unit by calling [phone] before 5PM. Please call [phone] after 5PM.  Thank you,  Erling Conte, LCSW [phone]

## 2016-09-22 NOTE — Care Management Important Message (Signed)
Important Message  Patient Details IM Letter given to Rhonda/Case Manager to present to Patient Name: Marnisha Stampley MRN: [id number] Date of Birth: [birth year]   Medicare Important Message Given:  Yes    Kerin Salen 09/22/2016, 11:42 AM

## 2016-09-22 NOTE — Progress Notes (Signed)
Pt now going home with [facility] [place]. Pt's daughter at bedside agreed.  Referral already given early this am.

## 2016-09-22 NOTE — Progress Notes (Signed)
[facility] called for 1200 noon pick up.

## 2016-09-22 NOTE — Discharge Summary (Signed)
Physician Discharge Summary  Kerri Vasquez AOZ:[id number] DOB: [birth year] DOA: 09/14/2016  PCP: [name], MD  Admit date: 09/14/2016 Discharge date: 09/22/2016  Admitted From: home Disposition:  Home  Recommendations for Outpatient Follow-up:  1. We will go home with hospice.  Home Health:No Equipment/Devices:none  Discharge Condition:Guarded CODE STATUS:DNR Diet recommendation: Heart Healthy   Brief/Interim Summary: Kerri Vasquez is an 61 y.o. female past medical history of stage IV breast cancer with a history of cardiomyopathy with an EF of 65% which has recovered and grade 1 diastolic heart failure, sacral decubitus ulcer who presents to the ED with increasing left ankle pain, she had been in rehabilitation until 2 weeks ago when she was discharged. She had a follow week prior to admission since then her left ankle has been hurting, she denies any loss of consciousness. In the ED and x-ray revealed left ankle fracture and distal tibia and fibula, rolling the ED she became hypotensive tachycardic and febrile, with an elevated lactic leukocytosis UA was compatible for UTI.  Discharge Diagnoses:  Principal Problem:   Sepsis ([place]) Active Problems:   Sleep apnea   Breast cancer metastasized to liver [facility] [address])   Takotsubo syndrome   Acute lower UTI   Pressure injury of skin  Sepsis due to UTI: Urine culture grew ESBL Escherichia coli and Klebsiella she completed her course in house. She had one of 2 blood cultures that was positive for GNR and CNS and acinetobacter (likely contaminant).  Left ankle fracture: Status post repaired with open reduction and internal fixation of trimalleolar ankle fracture. Pain seems to be controlled continue current regimen.  History of metastatic breast cancer: Last chemotherapy was 2 months ago cardiology was consulted and recommended to move towards comfort route as she is a very poor prognosis. Hospice and  palliative Care was consulted and the family decided to move towards comfort care at home. Most of her medications were stopped except for narcotics.  Hypothyroidism: TSH 5.6  Chronic kidney disease stage III: Chronic seems to be her baseline.  Adrenal insufficiency questionable.  Elevated d-dimer: Clinical sepsis.  Stage IV sacral decubitus ulcer  Acute encephalopathy, Likely due to infectious etiology now resolved.   Discharge Instructions  Discharge Instructions    Diet - low sodium heart healthy    Complete by:  As directed    Increase activity slowly    Complete by:  As directed      Allergies as of 09/22/2016      Reactions   Influenza Vac Split [flu Virus Vaccine] Other (See Comments)   Joint stiffness and renal failure   Pneumococcal Vaccines Other (See Comments)   "sepsis and renal failure"   Ritalin [methylphenidate Hcl]    "Heart attack"   Zostavax [zoster Vaccine Live ([place]-[employer]ck)] Other (See Comments)   Joint stiffness, renal failure   Albumin (human)    Updating patient's allergy by adding albumin human so that medication interactions tied to albumin would fire. Tied to [id number] and [id number]. Patient's medication interactions were not firing because the albumin value below (06/26/2014) was not causing any medication interaction as [employer] had changed it to a bulk chemical rather than a drug ingredient.    Adhesive [tape] Itching, Rash      Medication List    STOP taking these medications   Abemaciclib 150 MG Tabs Commonly known as:  VERZENIO   cyanocobalamin 1000 MCG/ML injection Commonly known as:  (VITAMIN B-12)   dexamethasone 4 MG tablet Commonly known as:  DECADRON   diphenoxylate-atropine 2.5-0.025 MG tablet Commonly known as:  LOMOTIL   MULTI-VITAMINS Tabs   nitroGLYCERIN 0.4 MG SL tablet Commonly known as:  NITROSTAT   prochlorperazine 10 MG tablet Commonly known as:  COMPAZINE   promethazine 25 MG tablet Commonly known as:   PHENERGAN   Suvorexant 20 MG Tabs Commonly known as:  BELSOMRA   tiZANidine 4 MG capsule Commonly known as:  ZANAFLEX     TAKE these medications   fentaNYL 100 MCG/HR Commonly known as:  DURAGESIC - dosed mcg/hr Place 1 patch (100 mcg total) onto the skin every other day.   fludrocortisone 0.1 MG tablet Commonly known as:  FLORINEF Take 1 tablet (100 mcg total) by mouth 2 (two) times daily.   FLUoxetine 40 MG capsule Commonly known as:  PROZAC Take 40 mg by mouth daily before breakfast.   ibuprofen 600 MG tablet Commonly known as:  ADVIL,MOTRIN Take 600 mg by mouth every 8 (eight) hours as needed for pain.   levothyroxine 75 MCG tablet Commonly known as:  SYNTHROID Take 1 tablet (75 mcg total) by mouth daily before breakfast.   levothyroxine 200 MCG tablet Commonly known as:  SYNTHROID, LEVOTHROID Take 1 tablet (200 mcg total) by mouth daily with breakfast.   lidocaine-prilocaine cream Commonly known as:  EMLA Apply 1 application topically as needed for port access   methocarbamol 750 MG tablet Commonly known as:  ROBAXIN TAKE 1 TABLET BY MOUTH THREE TIMES DAILY AS NEEDED   ondansetron 8 MG tablet Commonly known as:  ZOFRAN TAKE 1 TABLET BY MOUTH EVERY 8 HOURS AS NEEDED FOR NAUSEA AND VOMITING.   Oxycodone HCl 20 MG Tabs Take 1 tablet (20 mg total) by mouth every 4 (four) hours as needed.   Pancrelipase (Lip-Prot-Amyl) 24000-76000 units Cpep Take 1 capsule (24,000 Units total) by mouth 3 (three) times daily before meals.   pantoprazole 40 MG tablet Commonly known as:  PROTONIX Take 1 tablet (40 mg total) by mouth 2 (two) times daily.   polyethylene glycol packet Commonly known as:  MIRALAX / GLYCOLAX Take 17 g by mouth 2 (two) times daily. What changed:  when to take this  reasons to take this   pregabalin 200 MG capsule Commonly known as:  LYRICA Take 1 capsule (200 mg total) by mouth 3 (three) times daily.       Allergies  Allergen Reactions   . Influenza Vac Split [Flu Virus Vaccine] Other (See Comments)    Joint stiffness and renal failure  . Pneumococcal Vaccines Other (See Comments)    "sepsis and renal failure"  . Ritalin [Methylphenidate Hcl]     "Heart attack"  . Zostavax [Zoster Vaccine Live ([employer])] Other (See Comments)    Joint stiffness, renal failure  . Albumin (Human)     Updating patient's allergy by adding albumin human so that medication interactions tied to albumin would fire. Tied to [id number] and [id number]. Patient's medication interactions were not firing because the albumin value below (06/26/2014) was not causing any medication interaction as [employer] had changed it to a bulk chemical rather than a drug ingredient.   . Adhesive [Tape] Itching and Rash    Consultations:  Orthopedics  oncology   Procedures/Studies: Dg Tibia/fibula Right  Result Date: 09/18/2016 CLINICAL DATA:  History of left ankle fracture, right tibia and fibula pain EXAM: RIGHT TIBIA AND FIBULA - 2 VIEW COMPARISON:  02/08/2016 FINDINGS: Eight low resolution intraoperative spot views of the right tibia and fibula obtained. Total fluoroscopy  time was 0.5 minutes. No obvious fracture or malalignment allowing for limited resolution. There is a rectangular opacity on the later images projecting over the soft tissues of the mid lower leg. IMPRESSION: 1. No gross fracture malalignment, follow-up dedicated views of the right tibia and fibula as indicated 2. Rectangular opacity projects over the soft tissues of the mid lower leg, clinical correlation recommended. Electronically Signed   By: Donavan Foil M.D.   On: 09/18/2016 13:59   Dg Ankle Complete Left  Result Date: 09/18/2016 CLINICAL DATA:  61 year old female with left ankle fracture. Subsequent encounter. EXAM: LEFT ANKLE COMPLETE - 3+ VIEW COMPARISON:  09/14/2016. FINDINGS: Four intraoperative C-arm views submitted for review after surgery. 2 pins utilized to transfix medial malleolar  fracture. No complication noted. IMPRESSION: Open reduction and internal fixation left medial malleolar fracture. Electronically Signed   By: Genia Del M.D.   On: 09/18/2016 13:39   Dg Ankle Complete Left  Result Date: 09/14/2016 CLINICAL DATA:  Fall approximately 1 week ago EXAM: LEFT ANKLE COMPLETE - 3+ VIEW COMPARISON:  None. FINDINGS: Bones are osteopenic which limits the exam. Suspect subtle linear nondisplaced fracture through the distal fibular malleolus. There may be subtle posterior malleolar fracture of the distal tibia as well. There is a fracture through the distal, medial tibia with extension of fracture lucency to the superior, medial articular surface of the ankle joint. Minimal medial displacement of distal fracture fragment. There is soft tissue swelling. Questionable osteochondral lesion lateral talar dome on the oblique view. IMPRESSION: 1. Bones appear osteopenic which limits the exam 2. Slightly displaced and mildly comminuted intra-articular fracture of the distal tibia and medial malleolus. Questionable posterior malleolar fracture. 3. Suspect nondisplaced distal fibular fracture. 4. Possible small osteochondral defect lateral talar dome on one view. Electronically Signed   By: Donavan Foil M.D.   On: 09/14/2016 19:01   Ct Head Wo Contrast  Result Date: 09/15/2016 CLINICAL DATA:  Fall 1 week ago EXAM: CT HEAD WITHOUT CONTRAST TECHNIQUE: Contiguous axial images were obtained from the base of the skull through the vertex without intravenous contrast. COMPARISON:  Brain MRI 02/06/2016 FINDINGS: Brain: No mass lesion, intraparenchymal hemorrhage or extra-axial collection. No evidence of acute cortical infarct. Brain parenchyma and CSF-containing spaces are normal for age. Vascular: No hyperdense vessel or unexpected calcification. Skull: Normal visualized skull base, calvarium and extracranial soft tissues. Sinuses/Orbits: No sinus fluid levels or advanced mucosal thickening. No  mastoid effusion. Normal orbits. IMPRESSION: Normal head CT for age. Electronically Signed   By: Ulyses Jarred M.D.   On: 09/15/2016 00:20   Ct Head W & Wo Contrast  Result Date: 09/19/2016 CLINICAL DATA:  [place] last week. History of metastatic breast cancer, migraine, hypertension, encephalopathy. EXAM: CT HEAD WITHOUT AND WITH CONTRAST TECHNIQUE: Contiguous axial images were obtained from the base of the skull through the vertex without and with intravenous contrast CONTRAST:  150mL ISOVUE-300 IOPAMIDOL (ISOVUE-300) INJECTION 61% COMPARISON:  CT HEAD Sep 14, 2016 and MRI of the head February 06, 2016 FINDINGS: BRAIN: No intraparenchymal hemorrhage, mass effect nor midline shift. The ventricles and sulci are normal for age. Patchy supratentorial white matter hypodensities. No acute large vascular territory infarcts. No abnormal extra-axial fluid collections. Basal cisterns are patent. No abnormal intracranial enhancement. VASCULAR: Moderate calcific atherosclerosis of the carotid siphons. SKULL: No skull fracture. No significant scalp soft tissue swelling. SINUSES/ORBITS: The mastoid air-cells and included paranasal sinuses are well-aerated.The included ocular globes and orbital contents are non-suspicious. OTHER: None. IMPRESSION: No  acute intracranial process or CT findings of metastatic disease. Mild to moderate white matter changes compatible chronic small vessel ischemic disease and, may be related to prior chemotherapy. Electronically Signed   By: Elon Alas M.D.   On: 09/19/2016 15:09   Ct Chest W Contrast  Result Date: 09/19/2016 CLINICAL DATA:  Metastatic breast cancer EXAM: CT CHEST, ABDOMEN, AND PELVIS WITH CONTRAST TECHNIQUE: Multidetector CT imaging of the chest, abdomen and pelvis was performed following the standard protocol during bolus administration of intravenous contrast. CONTRAST:  198mL ISOVUE-300 IOPAMIDOL (ISOVUE-300) INJECTION 61% COMPARISON:  CT chest, abdomen, and pelvis dated  02/02/2016. PET-CT dated 10/26/2015. FINDINGS: CT CHEST FINDINGS Cardiovascular: Heart is normal in size.  No pericardial effusion. No evidence of thoracic aortic aneurysm. Right chest port terminates at in the upper right atrium. Mediastinum/Nodes: No suspicious mediastinal or axillary lymphadenopathy. Lungs/Pleura: Evaluation lung parenchyma is constrained by respiratory motion. Multifocal patchy opacities in the bilateral upper lobes (series 12/ image 36), possibly reflecting mild interstitial edema, less likely multifocal infection. Moderate bilateral pleural effusions. Associated lower lobe opacities, likely atelectasis. No pneumothorax. Musculoskeletal: Right breast prosthesis. Degenerative changes of the visualized thoracolumbar spine. Right lateral 4th, 5th, and 7th through 9th rib fracture deformities. Sclerotic osseous metastases, including the left T5 vertebral body (series 5/ image 21), left posterior 6th rib (series 5/ image 27), and right posterior 8th rib (series 5/ image 37), new. CT ABDOMEN PELVIS FINDINGS Hepatobiliary: Innumerable/diffuse hepatic metastases throughout both lobes. Dominant/ discrete masses are difficult to measure on the current CT, partially due to motion degradation. However, the overall number has significantly increased. Status post cholecystectomy. No intrahepatic or extrahepatic duct dilatation. Pancreas: Within normal limits. Spleen: Within normal limits. Adrenals/Urinary Tract: Adrenal glands are within normal limits. Kidneys are within normal limits. 8 mm proximal left ureteral calculus (series 5/image 71). No hydronephrosis. Bladder is decompressed by indwelling Foley catheter with associated intraluminal gas. Stomach/Bowel: Postsurgical changes related to partial gastrectomy with gastrojejunostomy. Additional small bowel suture line in the right mid abdomen (series 5/image 66). No evidence of bowel obstruction. Appendix is not discretely visualized. Small right mid  abdominal ventral hernia containing a loop of nondilated small bowel (series 5/image 67). Vascular/Lymphatic: No evidence of abdominal aortic aneurysm. No suspicious abdominopelvic lymphadenopathy. Reproductive: Status post hysterectomy. Bilateral ovaries are within normal limits. Other: Moderate abdominopelvic ascites. Associated peritoneal disease/ omental caking in the left upper abdomen (series 5/ image 49), new. Additional peritoneal disease/ omental caking in the right mid abdomen, measuring up to 5.9 x 1.8 cm (series 5/ image 62), previously 2.1 cm. Musculoskeletal: Degenerative changes of the lumbar spine, most prominent at L2-3. Multifocal sclerotic osseous metastases, including the posterior L1 vertebral body (series 5/ image 65), the right iliac bone (series 5/image 91), and the left superior acetabulum (series 5/ image 104) common new. IMPRESSION: Multifocal patchy opacities in the bilateral upper lobes, possibly reflecting mild interstitial edema, less likely multifocal infection. Moderate bilateral pleural effusions. Innumerable hepatic metastases, difficult to discretely measure, but increased in number from the prior. Moderate abdominopelvic ascites. Progression of peritoneal disease/ omental caking, as described above. Multifocal sclerotic osseous metastases in the thoracolumbar spine, ribs, and pelvis, as above, new. Electronically Signed   By: Julian Hy M.D.   On: 09/19/2016 16:00   Ct Abdomen Pelvis W Contrast  Result Date: 09/19/2016 CLINICAL DATA:  Metastatic breast cancer EXAM: CT CHEST, ABDOMEN, AND PELVIS WITH CONTRAST TECHNIQUE: Multidetector CT imaging of the chest, abdomen and pelvis was performed following the standard protocol  during bolus administration of intravenous contrast. CONTRAST:  113mL ISOVUE-300 IOPAMIDOL (ISOVUE-300) INJECTION 61% COMPARISON:  CT chest, abdomen, and pelvis dated 02/02/2016. PET-CT dated 10/26/2015. FINDINGS: CT CHEST FINDINGS Cardiovascular:  Heart is normal in size.  No pericardial effusion. No evidence of thoracic aortic aneurysm. Right chest port terminates at in the upper right atrium. Mediastinum/Nodes: No suspicious mediastinal or axillary lymphadenopathy. Lungs/Pleura: Evaluation lung parenchyma is constrained by respiratory motion. Multifocal patchy opacities in the bilateral upper lobes (series 12/ image 36), possibly reflecting mild interstitial edema, less likely multifocal infection. Moderate bilateral pleural effusions. Associated lower lobe opacities, likely atelectasis. No pneumothorax. Musculoskeletal: Right breast prosthesis. Degenerative changes of the visualized thoracolumbar spine. Right lateral 4th, 5th, and 7th through 9th rib fracture deformities. Sclerotic osseous metastases, including the left T5 vertebral body (series 5/ image 21), left posterior 6th rib (series 5/ image 27), and right posterior 8th rib (series 5/ image 37), new. CT ABDOMEN PELVIS FINDINGS Hepatobiliary: Innumerable/diffuse hepatic metastases throughout both lobes. Dominant/ discrete masses are difficult to measure on the current CT, partially due to motion degradation. However, the overall number has significantly increased. Status post cholecystectomy. No intrahepatic or extrahepatic duct dilatation. Pancreas: Within normal limits. Spleen: Within normal limits. Adrenals/Urinary Tract: Adrenal glands are within normal limits. Kidneys are within normal limits. 8 mm proximal left ureteral calculus (series 5/image 71). No hydronephrosis. Bladder is decompressed by indwelling Foley catheter with associated intraluminal gas. Stomach/Bowel: Postsurgical changes related to partial gastrectomy with gastrojejunostomy. Additional small bowel suture line in the right mid abdomen (series 5/image 66). No evidence of bowel obstruction. Appendix is not discretely visualized. Small right mid abdominal ventral hernia containing a loop of nondilated small bowel (series 5/image  67). Vascular/Lymphatic: No evidence of abdominal aortic aneurysm. No suspicious abdominopelvic lymphadenopathy. Reproductive: Status post hysterectomy. Bilateral ovaries are within normal limits. Other: Moderate abdominopelvic ascites. Associated peritoneal disease/ omental caking in the left upper abdomen (series 5/ image 49), new. Additional peritoneal disease/ omental caking in the right mid abdomen, measuring up to 5.9 x 1.8 cm (series 5/ image 62), previously 2.1 cm. Musculoskeletal: Degenerative changes of the lumbar spine, most prominent at L2-3. Multifocal sclerotic osseous metastases, including the posterior L1 vertebral body (series 5/ image 65), the right iliac bone (series 5/image 91), and the left superior acetabulum (series 5/ image 104) common new. IMPRESSION: Multifocal patchy opacities in the bilateral upper lobes, possibly reflecting mild interstitial edema, less likely multifocal infection. Moderate bilateral pleural effusions. Innumerable hepatic metastases, difficult to discretely measure, but increased in number from the prior. Moderate abdominopelvic ascites. Progression of peritoneal disease/ omental caking, as described above. Multifocal sclerotic osseous metastases in the thoracolumbar spine, ribs, and pelvis, as above, new. Electronically Signed   By: Julian Hy M.D.   On: 09/19/2016 16:00   Dg Knee Complete 4 Views Left  Result Date: 09/14/2016 CLINICAL DATA:  Fall a week ago.  Pain and swelling. EXAM: LEFT KNEE - COMPLETE 4+ VIEW COMPARISON:  None. FINDINGS: Callus formation associated with the proximal fibula is consistent with a remote healed fracture. Mild degenerative changes. No joint effusion. No acute fracture seen within the knee. IMPRESSION: No acute fracture seen within the knee. Remote healed proximal fibular fracture. Electronically Signed   By: Dorise Bullion III M.D   On: 09/14/2016 18:59   Dg C-arm 1-60 Min-no Report  Result Date: 09/18/2016 Fluoroscopy  was utilized by the requesting physician.  No radiographic interpretation.   Subjective: Only complains, she is a little bit down  after having reports the conversation with Palliative Care. Her daughter is at bedside  Discharge Exam: Vitals:   09/21/16 2253 09/22/16 0643  BP: (!) 107/57 103/60  Pulse: 94 90  Resp: 18 18  Temp: 97.9 F (36.6 C) 98.1 F (36.7 C)   Vitals:   09/21/16 0605 09/21/16 1415 09/21/16 2253 09/22/16 0643  BP: (!) 100/54 108/62 (!) 107/57 103/60  Pulse: 90 99 94 90  Resp: 14 14 18 18   Temp: 98.8 F (37.1 C) 97.9 F (36.6 C) 97.9 F (36.6 C) 98.1 F (36.7 C)  TempSrc: Oral Oral Oral Oral  SpO2: 100% 100% 99% 100%  Weight:      Height:        General: Patient is awake alert and oriented 3 in no acute distress Cardiovascular: Regular rate and rhythm with positive S1 and S2 Respiratory: Good air movement and clear to auscultation Abdominal: Positive bowel sounds, soft, nontender nondistended Extremities: No clubbing cyanosis or edema   The results of significant diagnostics from this hospitalization (including imaging, microbiology, ancillary and laboratory) are listed below for reference.     Microbiology: Recent Results (from the past 240 hour(s))  Culture, blood (routine x 2)     Status: Abnormal   Collection Time: 09/14/16  8:11 PM  Result Value Ref Range Status   Specimen Description BLOOD LEFT ANTECUBITAL  Final   Special Requests   Final    BOTTLES DRAWN AEROBIC AND ANAEROBIC Blood Culture adequate volume   Culture  Setup Time   Final    GRAM NEGATIVE RODS AEROBIC BOTTLE ONLY D. ZEIGLER, [place] ([facility]) AT 7124 ON 09/15/16 BY C. JESSUP, MLT. GRAM POSITIVE COCCI IN CLUSTERS ANAEROBIC BOTTLE ONLY CRITICAL RESULT CALLED TO, READ BACK BY AND VERIFIED WITH: N GLOGOVAC,PHARMD AT 1951 09/16/16 BY L BENFIELD    Culture (A)  Final    ACINETOBACTER SPECIES STAPHYLOCOCCUS SPECIES (COAGULASE NEGATIVE) THE SIGNIFICANCE OF ISOLATING THIS ORGANISM  FROM A SINGLE SET OF BLOOD CULTURES WHEN MULTIPLE SETS ARE DRAWN IS UNCERTAIN. PLEASE NOTIFY THE MICROBIOLOGY DEPARTMENT WITHIN ONE WEEK IF SPECIATION AND SENSITIVITIES ARE REQUIRED. Performed at [facility], [place] [address], [place], [place] [address]    Report Status 09/17/2016 FINAL  Final   Organism ID, Bacteria ACINETOBACTER SPECIES  Final      Susceptibility   Acinetobacter species - MIC*    CEFTAZIDIME <=1 SENSITIVE Sensitive     CEFTRIAXONE 8 SENSITIVE Sensitive     CIPROFLOXACIN <=0.25 SENSITIVE Sensitive     GENTAMICIN <=1 SENSITIVE Sensitive     IMIPENEM <=0.25 SENSITIVE Sensitive     PIP/TAZO >=128 RESISTANT Resistant     TRIMETH/SULFA <=20 SENSITIVE Sensitive     CEFEPIME <=1 SENSITIVE Sensitive     AMPICILLIN/SULBACTAM <=2 SENSITIVE Sensitive     * ACINETOBACTER SPECIES  Urine culture     Status: Abnormal   Collection Time: 09/14/16  8:11 PM  Result Value Ref Range Status   Specimen Description URINE, RANDOM  Final   Special Requests NONE  Final   Culture (A)  Final    >=100,000 COLONIES/mL ESCHERICHIA COLI Confirmed Extended Spectrum Beta-Lactamase Producer (ESBL) >=100,000 COLONIES/mL KLEBSIELLA OXYTOCA    Report Status 09/18/2016 FINAL  Final   Organism ID, Bacteria ESCHERICHIA COLI (A)  Final   Organism ID, Bacteria KLEBSIELLA OXYTOCA (A)  Final      Susceptibility   Escherichia coli - MIC*    AMPICILLIN >=32 RESISTANT Resistant     CEFAZOLIN >=64 RESISTANT Resistant  CEFTRIAXONE >=64 RESISTANT Resistant     CIPROFLOXACIN >=4 RESISTANT Resistant     GENTAMICIN >=16 RESISTANT Resistant     IMIPENEM <=0.25 SENSITIVE Sensitive     NITROFURANTOIN <=16 SENSITIVE Sensitive     TRIMETH/SULFA >=320 RESISTANT Resistant     AMPICILLIN/SULBACTAM >=32 RESISTANT Resistant     PIP/TAZO 8 SENSITIVE Sensitive     Extended ESBL POSITIVE Resistant     * >=100,000 COLONIES/mL ESCHERICHIA COLI   Klebsiella oxytoca - MIC*    AMPICILLIN >=32 RESISTANT Resistant      CEFAZOLIN >=64 RESISTANT Resistant     CEFTRIAXONE <=1 SENSITIVE Sensitive     CIPROFLOXACIN <=0.25 SENSITIVE Sensitive     GENTAMICIN <=1 SENSITIVE Sensitive     IMIPENEM <=0.25 SENSITIVE Sensitive     NITROFURANTOIN <=16 SENSITIVE Sensitive     TRIMETH/SULFA <=20 SENSITIVE Sensitive     AMPICILLIN/SULBACTAM 16 INTERMEDIATE Intermediate     PIP/TAZO <=4 SENSITIVE Sensitive     Extended ESBL NEGATIVE Sensitive     * >=100,000 COLONIES/mL KLEBSIELLA OXYTOCA  Blood Culture ID Panel (Reflexed)     Status: None   Collection Time: 09/14/16  8:11 PM  Result Value Ref Range Status   Enterococcus species NOT DETECTED NOT DETECTED Final   Listeria monocytogenes NOT DETECTED NOT DETECTED Final   Staphylococcus species NOT DETECTED NOT DETECTED Final   Staphylococcus aureus NOT DETECTED NOT DETECTED Final   Streptococcus species NOT DETECTED NOT DETECTED Final   Streptococcus agalactiae NOT DETECTED NOT DETECTED Final   Streptococcus pneumoniae NOT DETECTED NOT DETECTED Final   Streptococcus pyogenes NOT DETECTED NOT DETECTED Final   Acinetobacter baumannii NOT DETECTED NOT DETECTED Final   Enterobacteriaceae species NOT DETECTED NOT DETECTED Final   Enterobacter cloacae complex NOT DETECTED NOT DETECTED Final   Escherichia coli NOT DETECTED NOT DETECTED Final   Klebsiella oxytoca NOT DETECTED NOT DETECTED Final   Klebsiella pneumoniae NOT DETECTED NOT DETECTED Final   Proteus species NOT DETECTED NOT DETECTED Final   Serratia marcescens NOT DETECTED NOT DETECTED Final   Haemophilus influenzae NOT DETECTED NOT DETECTED Final   Neisseria meningitidis NOT DETECTED NOT DETECTED Final   Pseudomonas aeruginosa NOT DETECTED NOT DETECTED Final   Candida albicans NOT DETECTED NOT DETECTED Final   Candida glabrata NOT DETECTED NOT DETECTED Final   Candida krusei NOT DETECTED NOT DETECTED Final   Candida parapsilosis NOT DETECTED NOT DETECTED Final   Candida tropicalis NOT DETECTED NOT DETECTED Final     Comment: Performed at [facility], [address], [place], [place] [address]  Blood Culture ID Panel (Reflexed)     Status: Abnormal   Collection Time: 09/14/16  8:11 PM  Result Value Ref Range Status   Enterococcus species NOT DETECTED NOT DETECTED Final   Listeria monocytogenes NOT DETECTED NOT DETECTED Final   Staphylococcus species DETECTED (A) NOT DETECTED Final    Comment: Methicillin (oxacillin) susceptible coagulase negative staphylococcus. Possible blood culture contaminant (unless isolated from more than one blood culture draw or clinical case suggests pathogenicity). No antibiotic treatment is indicated for blood  culture contaminants. CRITICAL RESULT CALLED TO, READ BACK BY AND VERIFIED WITH: N GLOGOVAC,PHARMD AT 1951 09/16/16 BY L BENFIELD    Staphylococcus aureus NOT DETECTED NOT DETECTED Final   Methicillin resistance NOT DETECTED NOT DETECTED Final   Streptococcus species NOT DETECTED NOT DETECTED Final   Streptococcus agalactiae NOT DETECTED NOT DETECTED Final   Streptococcus pneumoniae NOT DETECTED NOT DETECTED Final   Streptococcus pyogenes  NOT DETECTED NOT DETECTED Final   Acinetobacter baumannii NOT DETECTED NOT DETECTED Final   Enterobacteriaceae species NOT DETECTED NOT DETECTED Final   Enterobacter cloacae complex NOT DETECTED NOT DETECTED Final   Escherichia coli NOT DETECTED NOT DETECTED Final   Klebsiella oxytoca NOT DETECTED NOT DETECTED Final   Klebsiella pneumoniae NOT DETECTED NOT DETECTED Final   Proteus species NOT DETECTED NOT DETECTED Final   Serratia marcescens NOT DETECTED NOT DETECTED Final   Haemophilus influenzae NOT DETECTED NOT DETECTED Final   Neisseria meningitidis NOT DETECTED NOT DETECTED Final   Pseudomonas aeruginosa NOT DETECTED NOT DETECTED Final   Candida albicans NOT DETECTED NOT DETECTED Final   Candida glabrata NOT DETECTED NOT DETECTED Final   Candida krusei NOT DETECTED NOT DETECTED Final   Candida parapsilosis NOT  DETECTED NOT DETECTED Final   Candida tropicalis NOT DETECTED NOT DETECTED Final    Comment: Performed at [facility], [place] [address], [place], [place] [address]  Culture, blood (routine x 2)     Status: None   Collection Time: 09/15/16 12:30 AM  Result Value Ref Range Status   Specimen Description BLOOD RIGHT ARM  Final   Special Requests   Final    BOTTLES DRAWN AEROBIC AND ANAEROBIC Blood Culture adequate volume   Culture   Final    NO GROWTH 5 DAYS Performed at [facility], [address], [place], [place] [address]    Report Status 09/20/2016 FINAL  Final  MRSA PCR Screening     Status: None   Collection Time: 09/15/16  2:55 AM  Result Value Ref Range Status   MRSA by PCR NEGATIVE NEGATIVE Final    Comment:        The GeneXpert MRSA Assay (FDA approved for NASAL specimens only), is one component of a comprehensive MRSA colonization surveillance program. It is not intended to diagnose MRSA infection nor to guide or monitor treatment for MRSA infections.   Surgical PCR screen     Status: None   Collection Time: 09/18/16 12:03 AM  Result Value Ref Range Status   MRSA, PCR NEGATIVE NEGATIVE Final   Staphylococcus aureus NEGATIVE NEGATIVE Final    Comment:        The Xpert SA Assay (FDA approved for NASAL specimens in patients over 47 years of age), is one component of a comprehensive surveillance program.  Test performance has been validated by [facility] for patients greater than or equal to 89 year old. It is not intended to diagnose infection nor to guide or monitor treatment.      Labs: BNP (last 3 results)  Recent Labs  03/14/16 1758  BNP 841.3*   Basic Metabolic Panel:  Recent Labs Lab 09/16/16 0517 09/17/16 0405 09/18/16 0449 09/22/16 0744  NA 141 137  --  145  K 3.5 3.2*  --  4.1  CL 113* 110  --  116*  CO2 17* 15*  --  17*  GLUCOSE 85 107*  --  81  BUN 50* 45*  --  38*  CREATININE 1.31* 1.38* 1.19* 1.13*  CALCIUM 8.1*  8.0*  --  8.6*   Liver Function Tests:  Recent Labs Lab 09/19/16 0800 09/22/16 0744  AST 93* 154*  ALT 29 27  ALKPHOS 403* 571*  BILITOT 1.1 1.3*  PROT 5.5* 6.2*  ALBUMIN 2.1* 2.3*   No results for input(s): LIPASE, AMYLASE in the last 168 hours.  Recent Labs Lab 09/19/16 0800 09/20/16 0825 09/22/16 2440  AMMONIA 105* 95* 40*   CBC:  Recent Labs Lab 09/16/16 0517 09/17/16 0405 09/22/16 0744  WBC 8.6 9.5 9.1  NEUTROABS  --   --  6.9  HGB 11.0* 11.1* 12.1  HCT 33.8* 33.5* 37.5  MCV 99.4 99.1 100.8*  PLT 206 199 127*   Cardiac Enzymes:  Recent Labs Lab 09/15/16 1004 09/15/16 1842 09/16/16 0006  TROPONINI 0.05* 0.03* 0.03*   BNP: Invalid input(s): POCBNP CBG:  Recent Labs Lab 09/18/16 1329  GLUCAP 100*   D-Dimer No results for input(s): DDIMER in the last 72 hours. Hgb A1c No results for input(s): HGBA1C in the last 72 hours. Lipid Profile No results for input(s): CHOL, HDL, LDLCALC, TRIG, CHOLHDL, LDLDIRECT in the last 72 hours. Thyroid function studies No results for input(s): TSH, T4TOTAL, T3FREE, THYROIDAB in the last 72 hours.  Invalid input(s): FREET3 Anemia work up No results for input(s): VITAMINB12, FOLATE, FERRITIN, TIBC, IRON, RETICCTPCT in the last 72 hours. Urinalysis    Component Value Date/Time   COLORURINE AMBER (A) 09/14/2016 2011   APPEARANCEUR CLOUDY (A) 09/14/2016 2011   LABSPEC 1.020 09/14/2016 2011   PHURINE 5.0 09/14/2016 2011   GLUCOSEU NEGATIVE 09/14/2016 2011   HGBUR LARGE (A) 09/14/2016 2011   BILIRUBINUR NEGATIVE 09/14/2016 2011   BILIRUBINUR negative 05/24/2011 1630   KETONESUR 20 (A) 09/14/2016 2011   PROTEINUR 100 (A) 09/14/2016 2011   UROBILINOGEN 0.2 08/26/2014 1850   NITRITE POSITIVE (A) 09/14/2016 2011   LEUKOCYTESUR LARGE (A) 09/14/2016 2011   Sepsis Labs Invalid input(s): PROCALCITONIN,  WBC,  LACTICIDVEN Microbiology Recent Results (from the past 240 hour(s))  Culture, blood (routine x 2)      Status: Abnormal   Collection Time: 09/14/16  8:11 PM  Result Value Ref Range Status   Specimen Description BLOOD LEFT ANTECUBITAL  Final   Special Requests   Final    BOTTLES DRAWN AEROBIC AND ANAEROBIC Blood Culture adequate volume   Culture  Setup Time   Final    GRAM NEGATIVE RODS AEROBIC BOTTLE ONLY D. ZEIGLER, [place] ([facility]) AT 9417 ON 09/15/16 BY C. JESSUP, MLT. GRAM POSITIVE COCCI IN CLUSTERS ANAEROBIC BOTTLE ONLY CRITICAL RESULT CALLED TO, READ BACK BY AND VERIFIED WITH: N GLOGOVAC,PHARMD AT 1951 09/16/16 BY L BENFIELD    Culture (A)  Final    ACINETOBACTER SPECIES STAPHYLOCOCCUS SPECIES (COAGULASE NEGATIVE) THE SIGNIFICANCE OF ISOLATING THIS ORGANISM FROM A SINGLE SET OF BLOOD CULTURES WHEN MULTIPLE SETS ARE DRAWN IS UNCERTAIN. PLEASE NOTIFY THE MICROBIOLOGY DEPARTMENT WITHIN ONE WEEK IF SPECIATION AND SENSITIVITIES ARE REQUIRED. Performed at [facility], [place] [address], [place], [place] [address]    Report Status 09/17/2016 FINAL  Final   Organism ID, Bacteria ACINETOBACTER SPECIES  Final      Susceptibility   Acinetobacter species - MIC*    CEFTAZIDIME <=1 SENSITIVE Sensitive     CEFTRIAXONE 8 SENSITIVE Sensitive     CIPROFLOXACIN <=0.25 SENSITIVE Sensitive     GENTAMICIN <=1 SENSITIVE Sensitive     IMIPENEM <=0.25 SENSITIVE Sensitive     PIP/TAZO >=128 RESISTANT Resistant     TRIMETH/SULFA <=20 SENSITIVE Sensitive     CEFEPIME <=1 SENSITIVE Sensitive     AMPICILLIN/SULBACTAM <=2 SENSITIVE Sensitive     * ACINETOBACTER SPECIES  Urine culture     Status: Abnormal   Collection Time: 09/14/16  8:11 PM  Result Value Ref Range Status   Specimen Description URINE, RANDOM  Final   Special Requests NONE  Final   Culture (  A)  Final    >=100,000 COLONIES/mL ESCHERICHIA COLI Confirmed Extended Spectrum Beta-Lactamase Producer (ESBL) >=100,000 COLONIES/mL KLEBSIELLA OXYTOCA    Report Status 09/18/2016 FINAL  Final   Organism ID, Bacteria ESCHERICHIA COLI (A)  Final    Organism ID, Bacteria KLEBSIELLA OXYTOCA (A)  Final      Susceptibility   Escherichia coli - MIC*    AMPICILLIN >=32 RESISTANT Resistant     CEFAZOLIN >=64 RESISTANT Resistant     CEFTRIAXONE >=64 RESISTANT Resistant     CIPROFLOXACIN >=4 RESISTANT Resistant     GENTAMICIN >=16 RESISTANT Resistant     IMIPENEM <=0.25 SENSITIVE Sensitive     NITROFURANTOIN <=16 SENSITIVE Sensitive     TRIMETH/SULFA >=320 RESISTANT Resistant     AMPICILLIN/SULBACTAM >=32 RESISTANT Resistant     PIP/TAZO 8 SENSITIVE Sensitive     Extended ESBL POSITIVE Resistant     * >=100,000 COLONIES/mL ESCHERICHIA COLI   Klebsiella oxytoca - MIC*    AMPICILLIN >=32 RESISTANT Resistant     CEFAZOLIN >=64 RESISTANT Resistant     CEFTRIAXONE <=1 SENSITIVE Sensitive     CIPROFLOXACIN <=0.25 SENSITIVE Sensitive     GENTAMICIN <=1 SENSITIVE Sensitive     IMIPENEM <=0.25 SENSITIVE Sensitive     NITROFURANTOIN <=16 SENSITIVE Sensitive     TRIMETH/SULFA <=20 SENSITIVE Sensitive     AMPICILLIN/SULBACTAM 16 INTERMEDIATE Intermediate     PIP/TAZO <=4 SENSITIVE Sensitive     Extended ESBL NEGATIVE Sensitive     * >=100,000 COLONIES/mL KLEBSIELLA OXYTOCA  Blood Culture ID Panel (Reflexed)     Status: None   Collection Time: 09/14/16  8:11 PM  Result Value Ref Range Status   Enterococcus species NOT DETECTED NOT DETECTED Final   Listeria monocytogenes NOT DETECTED NOT DETECTED Final   Staphylococcus species NOT DETECTED NOT DETECTED Final   Staphylococcus aureus NOT DETECTED NOT DETECTED Final   Streptococcus species NOT DETECTED NOT DETECTED Final   Streptococcus agalactiae NOT DETECTED NOT DETECTED Final   Streptococcus pneumoniae NOT DETECTED NOT DETECTED Final   Streptococcus pyogenes NOT DETECTED NOT DETECTED Final   Acinetobacter baumannii NOT DETECTED NOT DETECTED Final   Enterobacteriaceae species NOT DETECTED NOT DETECTED Final   Enterobacter cloacae complex NOT DETECTED NOT DETECTED Final   Escherichia coli NOT  DETECTED NOT DETECTED Final   Klebsiella oxytoca NOT DETECTED NOT DETECTED Final   Klebsiella pneumoniae NOT DETECTED NOT DETECTED Final   Proteus species NOT DETECTED NOT DETECTED Final   Serratia marcescens NOT DETECTED NOT DETECTED Final   Haemophilus influenzae NOT DETECTED NOT DETECTED Final   Neisseria meningitidis NOT DETECTED NOT DETECTED Final   Pseudomonas aeruginosa NOT DETECTED NOT DETECTED Final   Candida albicans NOT DETECTED NOT DETECTED Final   Candida glabrata NOT DETECTED NOT DETECTED Final   Candida krusei NOT DETECTED NOT DETECTED Final   Candida parapsilosis NOT DETECTED NOT DETECTED Final   Candida tropicalis NOT DETECTED NOT DETECTED Final    Comment: Performed at [facility], [address], [place],  [address]  Blood Culture ID Panel (Reflexed)     Status: Abnormal   Collection Time: 09/14/16  8:11 PM  Result Value Ref Range Status   Enterococcus species NOT DETECTED NOT DETECTED Final   Listeria monocytogenes NOT DETECTED NOT DETECTED Final   Staphylococcus species DETECTED (A) NOT DETECTED Final    Comment: Methicillin (oxacillin) susceptible coagulase negative staphylococcus. Possible blood culture contaminant (unless isolated from more than one blood culture draw or clinical case  suggests pathogenicity). No antibiotic treatment is indicated for blood  culture contaminants. CRITICAL RESULT CALLED TO, READ BACK BY AND VERIFIED WITH: N GLOGOVAC,PHARMD AT 1951 09/16/16 BY L BENFIELD    Staphylococcus aureus NOT DETECTED NOT DETECTED Final   Methicillin resistance NOT DETECTED NOT DETECTED Final   Streptococcus species NOT DETECTED NOT DETECTED Final   Streptococcus agalactiae NOT DETECTED NOT DETECTED Final   Streptococcus pneumoniae NOT DETECTED NOT DETECTED Final   Streptococcus pyogenes NOT DETECTED NOT DETECTED Final   Acinetobacter baumannii NOT DETECTED NOT DETECTED Final   Enterobacteriaceae species NOT DETECTED NOT DETECTED Final    Enterobacter cloacae complex NOT DETECTED NOT DETECTED Final   Escherichia coli NOT DETECTED NOT DETECTED Final   Klebsiella oxytoca NOT DETECTED NOT DETECTED Final   Klebsiella pneumoniae NOT DETECTED NOT DETECTED Final   Proteus species NOT DETECTED NOT DETECTED Final   Serratia marcescens NOT DETECTED NOT DETECTED Final   Haemophilus influenzae NOT DETECTED NOT DETECTED Final   Neisseria meningitidis NOT DETECTED NOT DETECTED Final   Pseudomonas aeruginosa NOT DETECTED NOT DETECTED Final   Candida albicans NOT DETECTED NOT DETECTED Final   Candida glabrata NOT DETECTED NOT DETECTED Final   Candida krusei NOT DETECTED NOT DETECTED Final   Candida parapsilosis NOT DETECTED NOT DETECTED Final   Candida tropicalis NOT DETECTED NOT DETECTED Final    Comment: Performed at [facility], [address], [place], [place] [address]  Culture, blood (routine x 2)     Status: None   Collection Time: 09/15/16 12:30 AM  Result Value Ref Range Status   Specimen Description BLOOD RIGHT ARM  Final   Special Requests   Final    BOTTLES DRAWN AEROBIC AND ANAEROBIC Blood Culture adequate volume   Culture   Final    NO GROWTH 5 DAYS Performed at [facility], [address], [place], [place] [address]    Report Status 09/20/2016 FINAL  Final  MRSA PCR Screening     Status: None   Collection Time: 09/15/16  2:55 AM  Result Value Ref Range Status   MRSA by PCR NEGATIVE NEGATIVE Final    Comment:        The GeneXpert MRSA Assay (FDA approved for NASAL specimens only), is one component of a comprehensive MRSA colonization surveillance program. It is not intended to diagnose MRSA infection nor to guide or monitor treatment for MRSA infections.   Surgical PCR screen     Status: None   Collection Time: 09/18/16 12:03 AM  Result Value Ref Range Status   MRSA, PCR NEGATIVE NEGATIVE Final   Staphylococcus aureus NEGATIVE NEGATIVE Final    Comment:        The Xpert SA Assay  (FDA approved for NASAL specimens in patients over 60 years of age), is one component of a comprehensive surveillance program.  Test performance has been validated by [facility] for patients greater than or equal to 40 year old. It is not intended to diagnose infection nor to guide or monitor treatment.      Time coordinating discharge: Over 30 minutes  SIGNED:   Charlynne Cousins, MD  [facility] 09/22/2016, 8:55 AM Pager   If 7PM-7AM, please contact night-coverage [email or web address] Password [email or web address]

## 2016-09-28 ENCOUNTER — Telehealth: Payer: Self-pay | Admitting: *Deleted

## 2016-09-28 NOTE — Telephone Encounter (Signed)
Received notice from [facility] that patient passed away on 2016/10/07 at 7:53pm  Dr Marin Olp notified.

## 2016-09-29 DEATH — deceased

## 2016-10-16 ENCOUNTER — Ambulatory Visit: Payer: Self-pay | Admitting: Hematology & Oncology

## 2016-10-16 ENCOUNTER — Other Ambulatory Visit: Payer: Self-pay

## 2016-12-09 IMAGING — MR MR LUMBAR SPINE WO/W CM
13 of 30 series · 16 of 48 positions shown · IV contrast (magnevist)
Comparison: PET-CT March 09, 2015

ADDENDUM:
Partially imaged pericardial effusion versus fluid in RIGHT fissure,
which would be better characterized on CT chest as clinically
indicated.
CLINICAL DATA: Staging for metastatic breast cancer. Known liver
metastasis.

EXAM:
MRI THORACIC AND LUMBAR SPINE WITHOUT AND WITH CONTRAST
TECHNIQUE: Multiplanar and multiecho pulse sequences of the thoracic and lumbar
spine were obtained without and with intravenous contrast.
CONTRAST:  20mL MAGNEVIST GADOPENTETATE DIMEGLUMINE 469.01 MG/ML IV
SOLN, 1 MULTIHANCE GADOBENATE DIMEGLUMINE 529 MG/ML IV SOLN

[Series 4: T2 · axial · 5.0mm · 0.43mm/px · z∈[-46,+91]mm · 2 of 24 slices shown (1 of 6)]
[im 1/24]
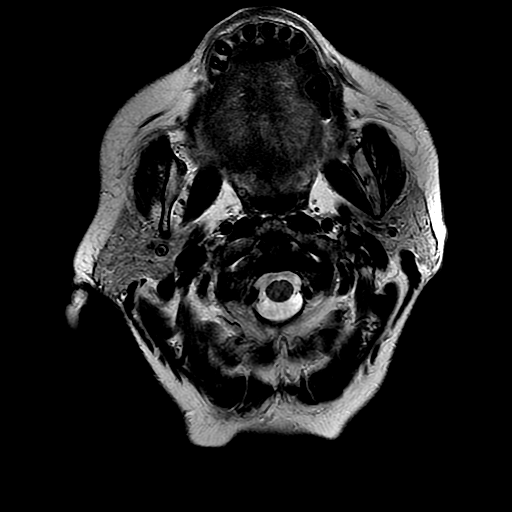
[im 24/24]
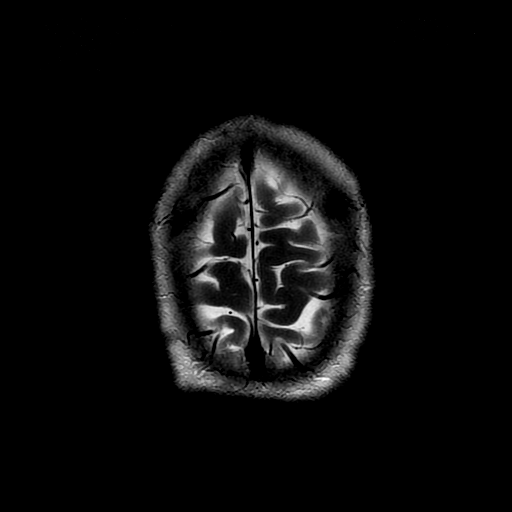

[Series 9: T1 · sagittal · 5.0mm · 0.47mm/px · 1 of 23 slices shown (1 of 5)]
[im 1/23]
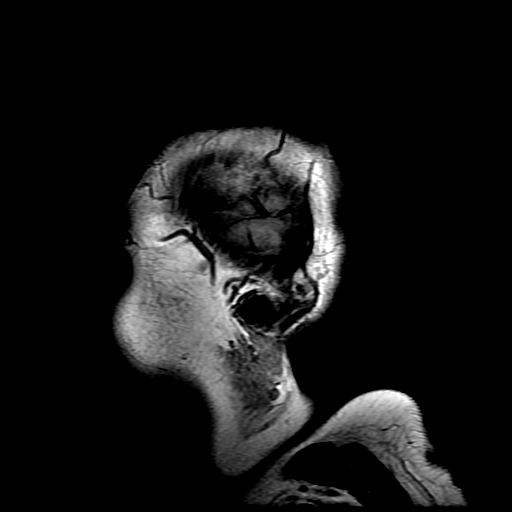

[Series 10: T2 · coronal · 5.0mm · 0.43mm/px · 1 of 28 slices shown (2 of 6)]
[im 1/28]
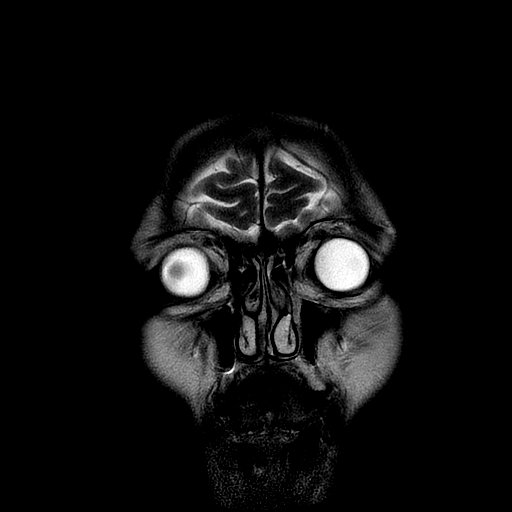

[Series 14: T2 · sagittal · 3.0mm · 0.62mm/px · 1 of 16 slices shown (3 of 6)]
[im 1/16]
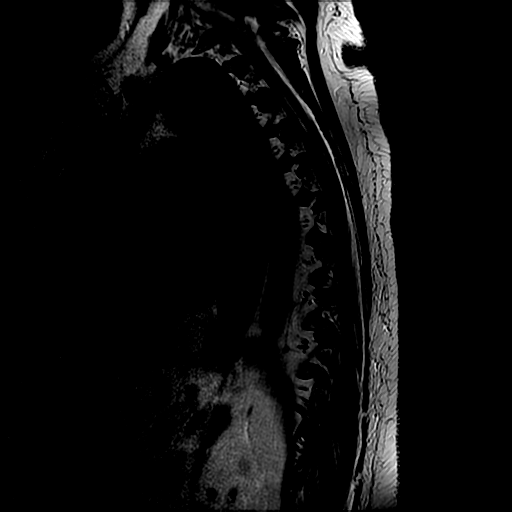

[Series 15: T1 · sagittal · 3.0mm · 0.62mm/px · 1 of 16 slices shown (2 of 5)]
[im 1/16]
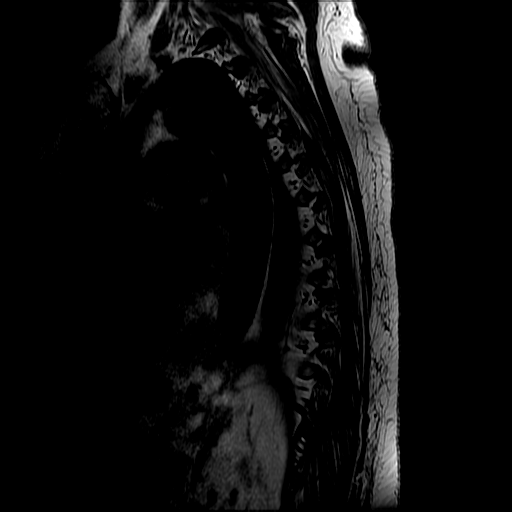

[Series 17: T2 · axial · 4.0mm · 0.43mm/px · 1 of 30 slices shown (4 of 6)]
[im 1/30]
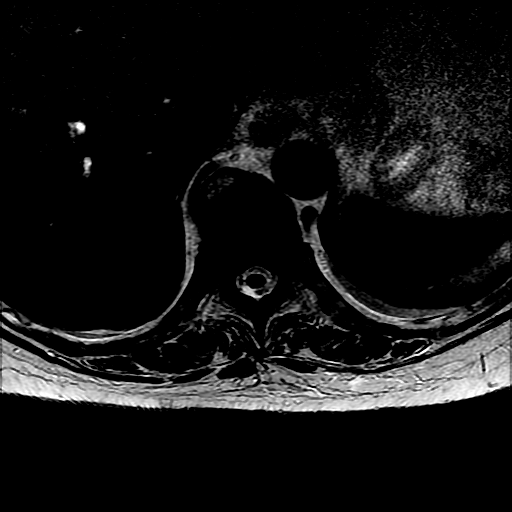

[Series 19: T1 · axial · non-contrast · 4.0mm · 0.43mm/px · 1 of 30 slices shown (3 of 5)]
[im 1/30]
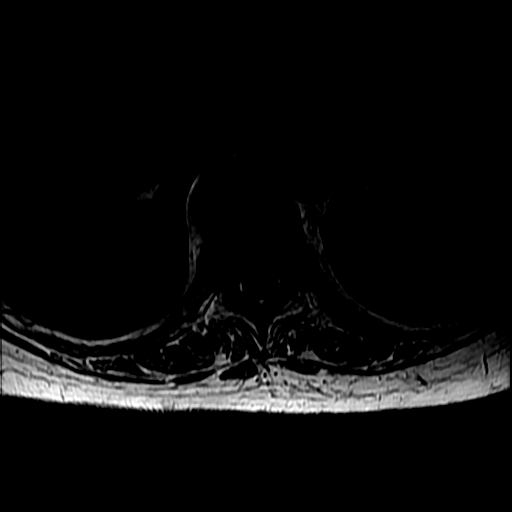

[Series 23: T2 · sagittal · 5.0mm · 0.59mm/px · 1 of 18 slices shown (5 of 6)]
[im 1/18]
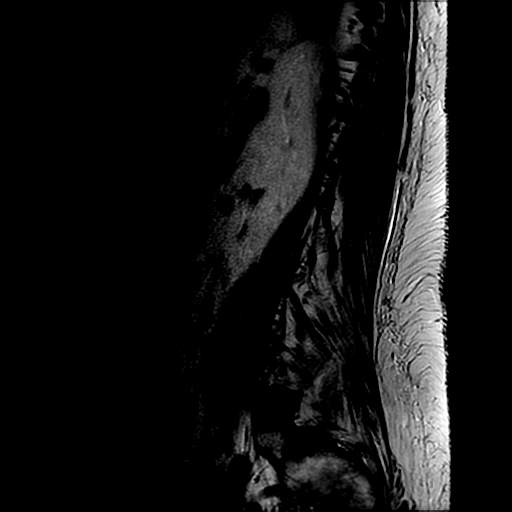

[Series 24: T1 · sagittal · 5.0mm · 0.59mm/px · 1 of 18 slices shown (4 of 5)]
[im 1/18]
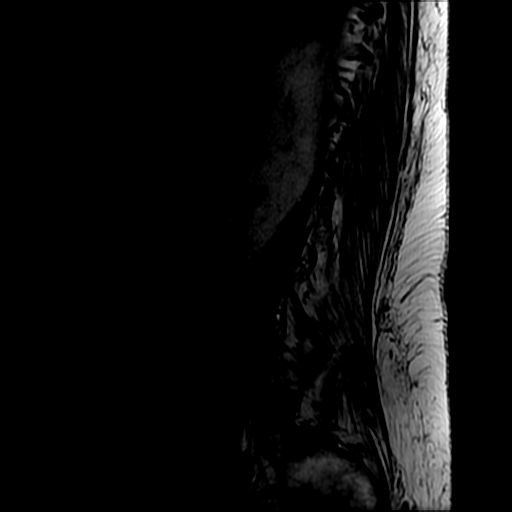

[Series 26: T2 · axial · 5.0mm · 0.43mm/px · z∈[-593,-320]mm · 2 of 48 slices shown (6 of 6)]
[im 1/48]
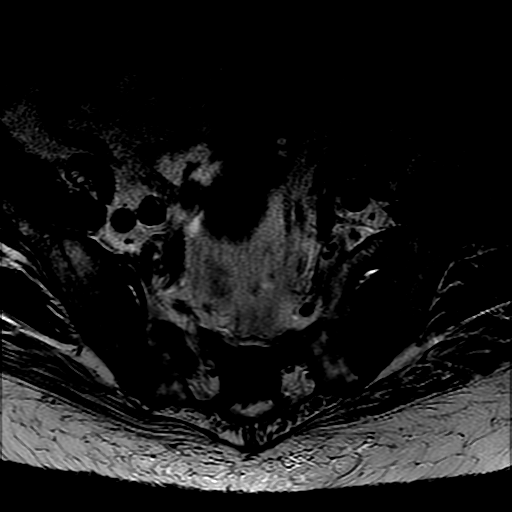
[im 48/48]
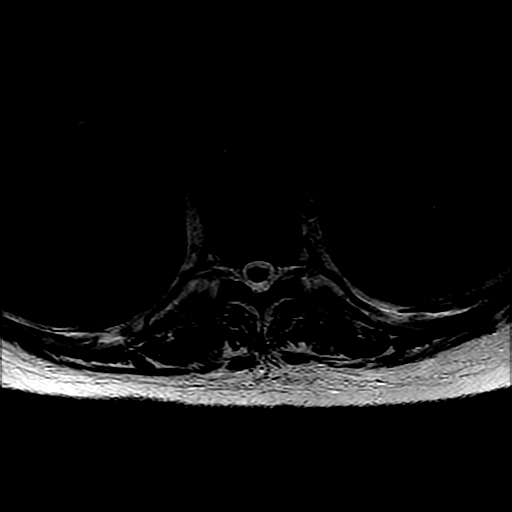

[Series 28: T1 · axial · 5.0mm · 0.43mm/px · z∈[-593,-320]mm · 2 of 48 slices shown (5 of 5)]
[im 1/48]
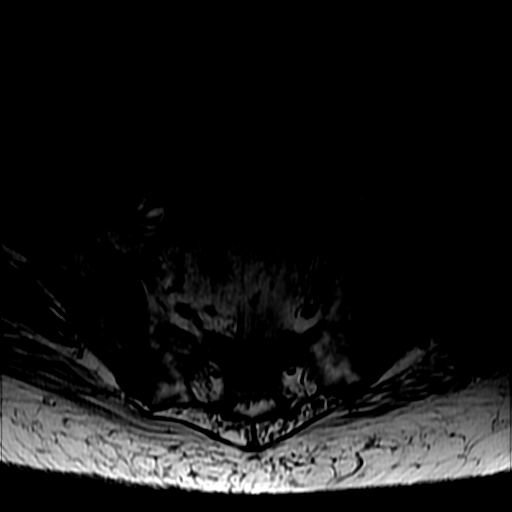
[im 48/48]
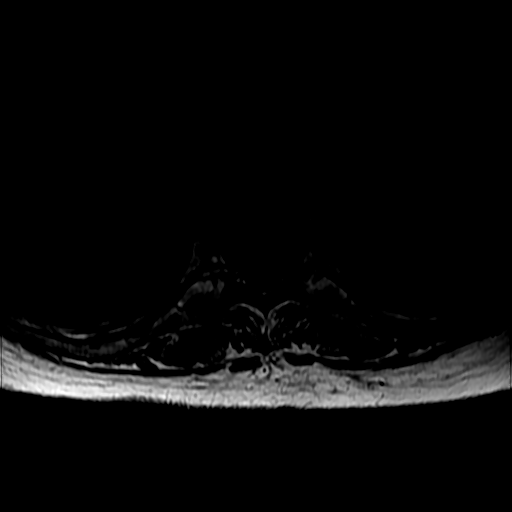

[Series 29: T1 post-contrast · sagittal · 5.0mm · 0.59mm/px · 1 of 18 slices shown (1 of 2)]
[im 1/18]
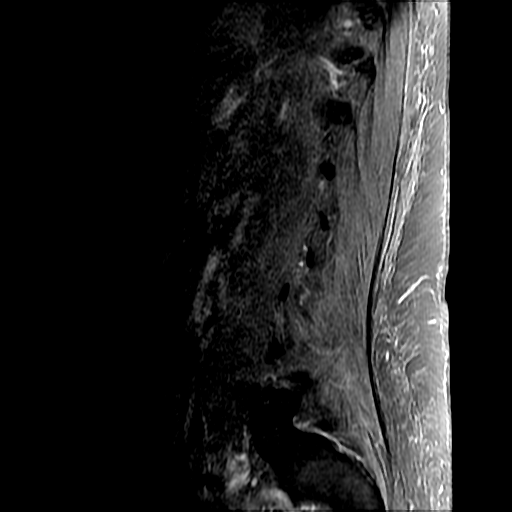

[Series 30: T1 post-contrast · axial · 5.0mm · 0.43mm/px · 1 of 48 slices shown (2 of 2)]
[im 1/48]
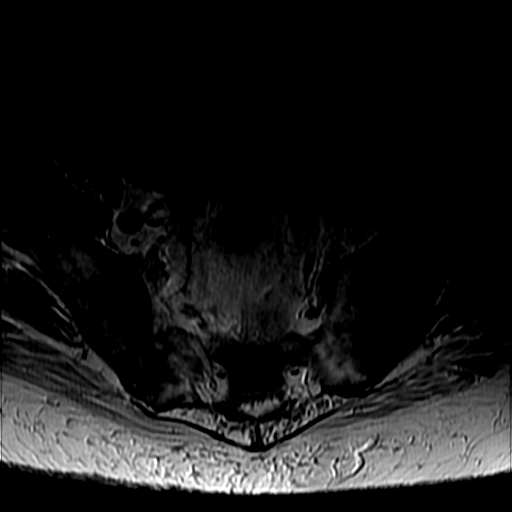

[16 of 48 positions shown; findings below may reference images not displayed]

FINDINGS: MR THORACIC SPINE FINDINGS

Thoracic vertebral bodies are intact aligned and maintenance of
thoracic kyphosis. Scattered chronic Schmorl's nodes. No suspicious
STIR signal abnormality to suggest fracture or bony metastasis.
Generalized low signal on fat saturation sequences most compatible
with osteopenia. No suspicious osseous or intradiscal enhancement.
Bulky bridging ventral osteophytes can be seen with DISH.

Cervical spinal cord is normal morphology and signal
characteristics, conus medullaris terminates at T12. No abnormal
cord, leptomeningeal or epidural enhancement. Crescentic fluid
signal and RIGHT lung, axial 18/30. Small bilateral pleural
effusions.

Tiny central T1-2 disc protrusion. Small LEFT central T4-5 disc
protrusion. Moderate RIGHT central T6-7 and T7-8 disc protrusions.
Axial sequences do not extend caudal to T8-9. No canal stenosis or
neural foraminal narrowing at any level.

MR LUMBAR SPINE FINDINGS

Grade 1 L1-2 retrolisthesis. Lumbar vertebral bodies intact.
Expansile bright T2 signal within the L2-3 disc associated with low
T1: Enhancing signal within the L2 inferior and L3 superior
endplates. Moderate to severe subacute on chronic L1-2 and L3-4
discogenic endplate changes, mild at L4-5. No focal abnormal
enhancement to suggest metastatic disease.

Conus medullaris terminates at T12-L1 and appears normal morphology
and signal characteristics. Central displacement of the cauda equina
due to canal stenosis, cauda equina is otherwise unremarkable. No
abnormal cord, leptomeningeal or epidural enhancement. Mild bright
interstitial STIR signal within the medial LEFT psoas muscle without
focal fluid collection or suspicious enhancement. Moderate to severe
relatively symmetric paraspinal muscle atrophy.

Level by level evaluation:

T12-L1: Small broad-based disc bulge. Mild to moderate facet
arthropathy and ligamentum flavum redundancy without canal stenosis
or neural foraminal narrowing.

L1-2: Retrolisthesis. Moderate broad-based disc osteophyte complex
eccentric to the LEFT encroaches upon the exited LEFT L1 nerve. Mild
facet arthropathy and ligamentum flavum redundancy. No significant
canal stenosis. Mild LEFT neural foraminal narrowing.

L2-3: Moderate broad-based disc bulge asymmetric to the RIGHT with
suspected RIGHT extra foraminal annular fissure. Severe facet
arthropathy and ligamentum flavum redundancy with trace facet
effusions which are likely reactive. Moderate canal stenosis,
including RIGHT lateral recess effacement which may affect the
traversing RIGHT L3 nerve. Mild RIGHT, severe LEFT neural foraminal
narrowing.

L3-4: Moderate broad-based disc bulge with faint enhancing fissure
versus annular calcification. Moderate facet arthropathy and
ligamentum flavum redundancy. Moderate canal stenosis. Moderate to
severe RIGHT, mild LEFT neural foraminal narrowing.

L4-5: Small to moderate broad-based disc bulge asymmetric to the
RIGHT. Severe RIGHT, mild LEFT facet arthropathy and ligamentum
flavum redundancy without canal stenosis. Severe RIGHT neural
foraminal narrowing.

L5-S1: Transitional anatomy, partially sacralized L5 vertebral body
without disc bulge, canal stenosis. Mild RIGHT neural foraminal
narrowing associated with severe facet arthropathy.
IMPRESSION: MRI THORACIC SPINE: No acute osseous process nor MR findings of
metastatic disease.

Multilevel disc protrusions without canal stenosis or neural
foraminal narrowing.

MRI LUMBAR SPINE:  No MR findings of metastatic disease.

Severe L2-3 disc edema and enhancing endplate changes, which can be
seen with severe inflammatory changes or, osteomyelitis which is
less favored. Recommend correlation with ESR/CRP.

Grade 1 L1-2 retrolisthesis on degenerative basis.

Moderate canal stenosis L2-3 and L3-4. Neural foraminal narrowing at
all lumbar levels: Severe on the LEFT at L2-3, severe on the RIGHT
at L4-5.

## 2016-12-11 IMAGING — US IR FLUORO GUIDE CV LINE*R*
1 series · 2 of 2 positions shown · non-contrast
Comparison: none

CLINICAL DATA: Progressive metastatic breast carcinoma. Needs
venous access for chemotherapy.
TECHNIQUE: The procedure, risks, benefits, and alternatives were explained to
the patient. Questions regarding the procedure were encouraged and
answered. The patient understands and consents to the procedure.
Antibiotic prophylaxis was ordered pre-procedure and administered
intravenously within one hour of incision. Patency of the right IJ
vein was confirmed with ultrasound with image documentation. An
appropriate skin site was determined. Skin site was marked. Region
was prepped using maximum barrier technique including cap and mask,
sterile gown, sterile gloves, large sterile sheet, and Chlorhexidine
as cutaneous antisepsis. The region was infiltrated locally with 1%
lidocaine. Under real-time ultrasound guidance, the right IJ vein
was accessed with a 21 gauge micropuncture needle; the needle tip
within the vein was confirmed with ultrasound image documentation.
Needle was exchanged over a 018 guidewire for transitional dilator
which allowed passage of the Benson wire into the IVC. Over this,
the transitional dilator was exchanged for a 5 French MPA catheter.
A small incision was made on the right anterior chest wall and a
subcutaneous pocket fashioned. The power-injectable port was
positioned and its catheter tunneled to the right IJ dermatotomy
site. The MPA catheter was exchanged over an Amplatz wire for a
peel-away sheath, through which the port catheter, which had been
trimmed to the appropriate length, was advanced and positioned under
fluoroscopy with its tip at the cavoatrial junction. Spot chest
radiograph confirms good catheter position and no pneumothorax. The
pocket was closed with deep interrupted and subcuticular continuous
3-0 Monocryl sutures. The port was flushed per protocol. The
incisions were covered with Dermabond then covered with a sterile
dressing.

COMPLICATIONS:
COMPLICATIONS
None immediate

[Series 1: ir fluoro/shunt/fist · 2 of 2 slices shown]
[im 1/2]
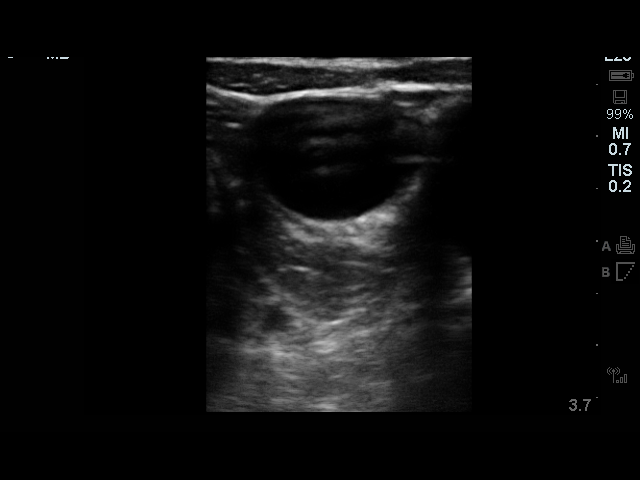
[im 2/2]
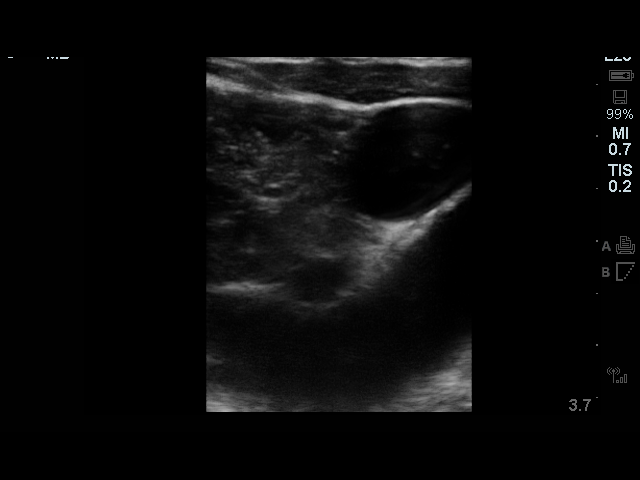

[2 of 2 positions shown; findings below may reference images not displayed]

EXAM:
TUNNELED PORT CATHETER PLACEMENT WITH ULTRASOUND AND FLUOROSCOPIC
GUIDANCE

FLUOROSCOPY TIME:  0.8 minutes, 62 uFym0 DAP

ANESTHESIA/SEDATION:
Intravenous Fentanyl and Versed were administered as conscious
sedation during continuous cardiorespiratory monitoring by the
radiology RN, with a total moderate sedation time of 20 minutes.
IMPRESSION: Technically successful right IJ power-injectable port catheter
placement. Ready for routine use.

## 2017-02-23 ENCOUNTER — Encounter: Payer: Self-pay | Admitting: Family
